# Patient Record
Sex: Male | Born: 1960 | Race: White | Hispanic: No | Smoking: Former smoker
Health system: Southern US, Community
[De-identification: ages and names within clinical notes are randomized; demographics above are authoritative.]

## PROBLEM LIST (undated history)

## (undated) DIAGNOSIS — M5410 Radiculopathy, site unspecified: Secondary | ICD-10-CM

## (undated) DIAGNOSIS — I10 Essential (primary) hypertension: Secondary | ICD-10-CM

## (undated) DIAGNOSIS — Z9002 Acquired absence of larynx: Secondary | ICD-10-CM

## (undated) DIAGNOSIS — E785 Hyperlipidemia, unspecified: Secondary | ICD-10-CM

## (undated) DIAGNOSIS — K219 Gastro-esophageal reflux disease without esophagitis: Secondary | ICD-10-CM

## (undated) DIAGNOSIS — I639 Cerebral infarction, unspecified: Secondary | ICD-10-CM

## (undated) DIAGNOSIS — E079 Disorder of thyroid, unspecified: Secondary | ICD-10-CM

## (undated) DIAGNOSIS — Z8673 Personal history of transient ischemic attack (TIA), and cerebral infarction without residual deficits: Secondary | ICD-10-CM

## (undated) DIAGNOSIS — E119 Type 2 diabetes mellitus without complications: Secondary | ICD-10-CM

## (undated) DIAGNOSIS — S92009A Unspecified fracture of unspecified calcaneus, initial encounter for closed fracture: Secondary | ICD-10-CM

## (undated) DIAGNOSIS — T1491XA Suicide attempt, initial encounter: Secondary | ICD-10-CM

## (undated) DIAGNOSIS — E109 Type 1 diabetes mellitus without complications: Secondary | ICD-10-CM

## (undated) DIAGNOSIS — F32A Depression, unspecified: Secondary | ICD-10-CM

## (undated) DIAGNOSIS — F329 Major depressive disorder, single episode, unspecified: Secondary | ICD-10-CM

## (undated) HISTORY — DX: Type 1 diabetes mellitus without complications: E10.9

## (undated) HISTORY — DX: Depression, unspecified: F32.A

## (undated) HISTORY — DX: Disorder of thyroid, unspecified: E07.9

## (undated) HISTORY — DX: Essential (primary) hypertension: I10

## (undated) HISTORY — DX: Acquired absence of larynx: Z90.02

## (undated) HISTORY — DX: Gastro-esophageal reflux disease without esophagitis: K21.9

## (undated) HISTORY — PX: LARYNGECTOMY: SUR815

## (undated) HISTORY — PX: NECK SURGERY: SHX720

## (undated) HISTORY — DX: Hyperlipidemia, unspecified: E78.5

## (undated) HISTORY — DX: Major depressive disorder, single episode, unspecified: F32.9

## (undated) HISTORY — PX: FRACTURE SURGERY: SHX138

## (undated) HISTORY — PX: SPINE SURGERY: SHX786

## (undated) HISTORY — PX: OTHER SURGICAL HISTORY: SHX169

---

## 2005-09-20 ENCOUNTER — Ambulatory Visit: Payer: Self-pay | Admitting: Internal Medicine

## 2005-10-06 ENCOUNTER — Ambulatory Visit: Payer: Self-pay

## 2006-03-08 ENCOUNTER — Ambulatory Visit: Payer: Self-pay

## 2006-04-09 ENCOUNTER — Ambulatory Visit: Payer: Self-pay | Admitting: Vascular Surgery

## 2009-11-26 ENCOUNTER — Inpatient Hospital Stay: Payer: Self-pay | Admitting: Psychiatry

## 2010-01-12 ENCOUNTER — Ambulatory Visit: Payer: Self-pay | Admitting: Family Medicine

## 2010-08-07 ENCOUNTER — Inpatient Hospital Stay: Payer: Self-pay | Admitting: *Deleted

## 2011-02-06 HISTORY — PX: HERNIA REPAIR: SHX51

## 2011-02-21 ENCOUNTER — Ambulatory Visit: Payer: Self-pay | Admitting: Surgery

## 2011-02-28 ENCOUNTER — Ambulatory Visit: Payer: Self-pay | Admitting: Surgery

## 2011-06-16 ENCOUNTER — Emergency Department: Payer: Self-pay | Admitting: Unknown Physician Specialty

## 2011-06-16 LAB — URINALYSIS, COMPLETE
Bilirubin,UR: NEGATIVE
Glucose,UR: NEGATIVE mg/dL (ref 0–75)
Ketone: NEGATIVE
Leukocyte Esterase: NEGATIVE
Ph: 5 (ref 4.5–8.0)
Protein: NEGATIVE
RBC,UR: NONE SEEN /HPF (ref 0–5)

## 2011-06-17 LAB — CBC
MCH: 32.2 pg (ref 26.0–34.0)
MCHC: 34.2 g/dL (ref 32.0–36.0)
MCV: 94 fL (ref 80–100)
WBC: 8.9 10*3/uL (ref 3.8–10.6)

## 2011-06-17 LAB — COMPREHENSIVE METABOLIC PANEL
Albumin: 4.3 g/dL (ref 3.4–5.0)
Alkaline Phosphatase: 72 U/L (ref 50–136)
Anion Gap: 8 (ref 7–16)
BUN: 18 mg/dL (ref 7–18)
Bilirubin,Total: 0.3 mg/dL (ref 0.2–1.0)
Calcium, Total: 9.1 mg/dL (ref 8.5–10.1)
Co2: 32 mmol/L (ref 21–32)
Creatinine: 1.06 mg/dL (ref 0.60–1.30)
EGFR (Non-African Amer.): >60
Glucose: 131 mg/dL — ABNORMAL HIGH (ref 65–99)
Osmolality: 289 (ref 275–301)
Sodium: 143 mmol/L (ref 136–145)
Total Protein: 7.5 g/dL (ref 6.4–8.2)

## 2012-05-02 ENCOUNTER — Ambulatory Visit: Payer: Self-pay | Admitting: Physical Medicine and Rehabilitation

## 2012-05-08 DIAGNOSIS — T1491XA Suicide attempt, initial encounter: Secondary | ICD-10-CM

## 2012-05-08 HISTORY — PX: TRACHEOSTOMY: SUR1362

## 2012-05-08 HISTORY — DX: Suicide attempt, initial encounter: T14.91XA

## 2012-08-27 ENCOUNTER — Emergency Department: Payer: Self-pay | Admitting: Emergency Medicine

## 2013-04-15 LAB — COMPREHENSIVE METABOLIC PANEL
Albumin: 3.8 g/dL (ref 3.4–5.0)
Alkaline Phosphatase: 91 U/L
Anion Gap: 6 — ABNORMAL LOW (ref 7–16)
Bilirubin,Total: 0.6 mg/dL (ref 0.2–1.0)
Calcium, Total: 9.7 mg/dL (ref 8.5–10.1)
Chloride: 106 mmol/L (ref 98–107)
EGFR (African American): >60
EGFR (Non-African Amer.): >60
Glucose: 171 mg/dL — ABNORMAL HIGH (ref 65–99)
Potassium: 3.8 mmol/L (ref 3.5–5.1)
SGOT(AST): 20 U/L (ref 15–37)
SGPT (ALT): 23 U/L (ref 12–78)
Total Protein: 7.1 g/dL (ref 6.4–8.2)

## 2013-04-15 LAB — CBC
HCT: 45.2 % (ref 40.0–52.0)
MCH: 30.8 pg (ref 26.0–34.0)
Platelet: 300 10*3/uL (ref 150–440)
RBC: 4.99 10*6/uL (ref 4.40–5.90)
RDW: 12.9 % (ref 11.5–14.5)
WBC: 9.7 10*3/uL (ref 3.8–10.6)

## 2013-04-15 LAB — ETHANOL: Ethanol: <3 mg/dL

## 2013-04-16 ENCOUNTER — Inpatient Hospital Stay: Payer: Self-pay | Admitting: Psychiatry

## 2013-04-16 LAB — URINALYSIS, COMPLETE
Bacteria: NONE SEEN
Glucose,UR: NEGATIVE mg/dL (ref 0–75)
Ketone: NEGATIVE
Leukocyte Esterase: NEGATIVE
Ph: 6 (ref 4.5–8.0)
Protein: NEGATIVE
RBC,UR: 1 /HPF (ref 0–5)
Specific Gravity: 1.012 (ref 1.003–1.030)

## 2013-04-16 LAB — DRUG SCREEN, URINE
Barbiturates, Ur Screen: NEGATIVE (ref ?–200)
Benzodiazepine, Ur Scrn: NEGATIVE (ref ?–200)
Cocaine Metabolite,Ur ~~LOC~~: NEGATIVE (ref ?–300)
MDMA (Ecstasy)Ur Screen: NEGATIVE (ref ?–500)
Methadone, Ur Screen: NEGATIVE (ref ?–300)
Opiate, Ur Screen: NEGATIVE (ref ?–300)
Phencyclidine (PCP) Ur S: NEGATIVE (ref ?–25)
Tricyclic, Ur Screen: NEGATIVE (ref ?–1000)

## 2013-05-04 ENCOUNTER — Emergency Department: Payer: Self-pay | Admitting: Emergency Medicine

## 2013-05-04 LAB — CBC WITH DIFFERENTIAL/PLATELET
Basophil %: 0.8 %
Eosinophil #: 0 10*3/uL (ref 0.0–0.7)
HCT: 47 % (ref 40.0–52.0)
HGB: 14.8 g/dL (ref 13.0–18.0)
Lymphocyte #: 1.8 10*3/uL (ref 1.0–3.6)
MCH: 30.6 pg (ref 26.0–34.0)
Neutrophil #: 25 10*3/uL — ABNORMAL HIGH (ref 1.4–6.5)
Neutrophil %: 86.5 %
Platelet: 319 10*3/uL (ref 150–440)
RBC: 4.82 10*6/uL (ref 4.40–5.90)
RDW: 13.7 % (ref 11.5–14.5)
WBC: 28.9 10*3/uL — ABNORMAL HIGH (ref 3.8–10.6)

## 2013-05-04 LAB — COMPREHENSIVE METABOLIC PANEL
Albumin: 3.4 g/dL (ref 3.4–5.0)
Alkaline Phosphatase: 121 U/L — ABNORMAL HIGH
Calcium, Total: 8.6 mg/dL (ref 8.5–10.1)
Chloride: 95 mmol/L — ABNORMAL LOW (ref 98–107)
Co2: 15 mmol/L — ABNORMAL LOW (ref 21–32)
Creatinine: 3.97 mg/dL — ABNORMAL HIGH (ref 0.60–1.30)
EGFR (African American): 19 — ABNORMAL LOW
EGFR (Non-African Amer.): 16 — ABNORMAL LOW
Glucose: 475 mg/dL — ABNORMAL HIGH (ref 65–99)
Potassium: 7.3 mmol/L (ref 3.5–5.1)
SGPT (ALT): 626 U/L — ABNORMAL HIGH (ref 12–78)
Sodium: 133 mmol/L — ABNORMAL LOW (ref 136–145)
Total Protein: 6.5 g/dL (ref 6.4–8.2)

## 2013-05-04 LAB — URINALYSIS, COMPLETE
Bilirubin,UR: NEGATIVE
Granular Cast: 4
Hyaline Cast: 1
Ketone: NEGATIVE
Leukocyte Esterase: NEGATIVE
Nitrite: NEGATIVE
Ph: 5 (ref 4.5–8.0)
RBC,UR: 1 /HPF (ref 0–5)
WBC UR: 3 /HPF (ref 0–5)

## 2013-05-04 LAB — DRUG SCREEN, URINE
Amphetamines, Ur Screen: NEGATIVE (ref ?–1000)
Barbiturates, Ur Screen: NEGATIVE (ref ?–200)
MDMA (Ecstasy)Ur Screen: NEGATIVE (ref ?–500)
Methadone, Ur Screen: NEGATIVE (ref ?–300)

## 2013-05-04 LAB — CK: CK, Total: 6260 U/L — ABNORMAL HIGH (ref 35–232)

## 2013-05-04 LAB — PHOSPHORUS: Phosphorus: 11.8 mg/dL — ABNORMAL HIGH (ref 2.5–4.9)

## 2013-05-04 LAB — ACETAMINOPHEN LEVEL: Acetaminophen: <2 ug/mL

## 2013-05-04 LAB — SALICYLATE LEVEL: Salicylates, Serum: 2.5 mg/dL

## 2013-05-09 DIAGNOSIS — D72829 Elevated white blood cell count, unspecified: Secondary | ICD-10-CM | POA: Insufficient documentation

## 2013-05-09 DIAGNOSIS — M6282 Rhabdomyolysis: Secondary | ICD-10-CM | POA: Insufficient documentation

## 2013-05-09 DIAGNOSIS — N179 Acute kidney failure, unspecified: Secondary | ICD-10-CM | POA: Insufficient documentation

## 2013-05-09 DIAGNOSIS — F329 Major depressive disorder, single episode, unspecified: Secondary | ICD-10-CM | POA: Insufficient documentation

## 2013-05-09 DIAGNOSIS — R7401 Elevation of levels of liver transaminase levels: Secondary | ICD-10-CM | POA: Insufficient documentation

## 2013-05-21 DIAGNOSIS — G9340 Encephalopathy, unspecified: Secondary | ICD-10-CM | POA: Insufficient documentation

## 2013-05-21 DIAGNOSIS — J189 Pneumonia, unspecified organism: Secondary | ICD-10-CM | POA: Insufficient documentation

## 2013-05-21 DIAGNOSIS — J180 Bronchopneumonia, unspecified organism: Secondary | ICD-10-CM | POA: Insufficient documentation

## 2013-05-21 DIAGNOSIS — I639 Cerebral infarction, unspecified: Secondary | ICD-10-CM | POA: Insufficient documentation

## 2013-06-04 DIAGNOSIS — G909 Disorder of the autonomic nervous system, unspecified: Secondary | ICD-10-CM | POA: Insufficient documentation

## 2013-06-12 DIAGNOSIS — F17201 Nicotine dependence, unspecified, in remission: Secondary | ICD-10-CM | POA: Insufficient documentation

## 2013-06-19 DIAGNOSIS — J385 Laryngeal spasm: Secondary | ICD-10-CM | POA: Insufficient documentation

## 2013-06-19 DIAGNOSIS — Q315 Congenital laryngomalacia: Secondary | ICD-10-CM | POA: Insufficient documentation

## 2013-06-19 DIAGNOSIS — I214 Non-ST elevation (NSTEMI) myocardial infarction: Secondary | ICD-10-CM | POA: Insufficient documentation

## 2013-07-14 ENCOUNTER — Ambulatory Visit: Payer: Self-pay | Admitting: Otolaryngology

## 2013-07-14 LAB — CREATININE, SERUM: Creatinine: 1.3 mg/dL (ref 0.60–1.30)

## 2013-08-19 DIAGNOSIS — J988 Other specified respiratory disorders: Secondary | ICD-10-CM | POA: Insufficient documentation

## 2013-08-19 DIAGNOSIS — J386 Stenosis of larynx: Secondary | ICD-10-CM | POA: Insufficient documentation

## 2013-08-26 ENCOUNTER — Encounter: Payer: Self-pay | Admitting: Family Medicine

## 2013-09-05 ENCOUNTER — Encounter: Payer: Self-pay | Admitting: Family Medicine

## 2013-09-15 DIAGNOSIS — E1069 Type 1 diabetes mellitus with other specified complication: Secondary | ICD-10-CM | POA: Insufficient documentation

## 2013-09-15 DIAGNOSIS — I82409 Acute embolism and thrombosis of unspecified deep veins of unspecified lower extremity: Secondary | ICD-10-CM | POA: Insufficient documentation

## 2013-09-15 DIAGNOSIS — Z9889 Other specified postprocedural states: Secondary | ICD-10-CM | POA: Insufficient documentation

## 2013-09-15 DIAGNOSIS — E785 Hyperlipidemia, unspecified: Secondary | ICD-10-CM | POA: Insufficient documentation

## 2013-10-06 ENCOUNTER — Encounter: Payer: Self-pay | Admitting: Family Medicine

## 2013-11-05 ENCOUNTER — Encounter: Payer: Self-pay | Admitting: Family Medicine

## 2013-12-22 DIAGNOSIS — F411 Generalized anxiety disorder: Secondary | ICD-10-CM | POA: Insufficient documentation

## 2014-02-17 ENCOUNTER — Inpatient Hospital Stay: Payer: Self-pay | Admitting: Internal Medicine

## 2014-02-17 LAB — COMPREHENSIVE METABOLIC PANEL
ALBUMIN: 3.4 g/dL (ref 3.4–5.0)
ALK PHOS: 115 U/L
ANION GAP: 12 (ref 7–16)
BUN: 27 mg/dL — ABNORMAL HIGH (ref 7–18)
Bilirubin,Total: 0.3 mg/dL (ref 0.2–1.0)
CALCIUM: 8.6 mg/dL (ref 8.5–10.1)
CREATININE: 1.71 mg/dL — AB (ref 0.60–1.30)
Chloride: 108 mmol/L — ABNORMAL HIGH (ref 98–107)
Co2: 18 mmol/L — ABNORMAL LOW (ref 21–32)
EGFR (African American): 54 — ABNORMAL LOW
EGFR (Non-African Amer.): 45 — ABNORMAL LOW
Glucose: 281 mg/dL — ABNORMAL HIGH (ref 65–99)
OSMOLALITY: 291 (ref 275–301)
Potassium: 4.3 mmol/L (ref 3.5–5.1)
SGOT(AST): 20 U/L (ref 15–37)
SGPT (ALT): 25 U/L
SODIUM: 138 mmol/L (ref 136–145)
Total Protein: 6.9 g/dL (ref 6.4–8.2)

## 2014-02-17 LAB — CBC
HCT: 45 % (ref 40.0–52.0)
HGB: 14.7 g/dL (ref 13.0–18.0)
MCH: 30 pg (ref 26.0–34.0)
MCHC: 32.7 g/dL (ref 32.0–36.0)
MCV: 92 fL (ref 80–100)
PLATELETS: 310 10*3/uL (ref 150–440)
RBC: 4.9 10*6/uL (ref 4.40–5.90)
RDW: 14.3 % (ref 11.5–14.5)
WBC: 10.1 10*3/uL (ref 3.8–10.6)

## 2014-02-17 LAB — PROTIME-INR
INR: 1
Prothrombin Time: 12.9 secs (ref 11.5–14.7)

## 2014-02-17 LAB — MAGNESIUM: MAGNESIUM: 1.7 mg/dL — AB

## 2014-02-17 LAB — PHOSPHORUS: Phosphorus: 4 mg/dL (ref 2.5–4.9)

## 2014-02-17 LAB — TROPONIN I: Troponin-I: <0.02 ng/mL

## 2014-02-18 LAB — CBC WITH DIFFERENTIAL/PLATELET
BASOS PCT: 0.7 %
Basophil #: 0.1 10*3/uL (ref 0.0–0.1)
Eosinophil #: 0.1 10*3/uL (ref 0.0–0.7)
Eosinophil %: 0.7 %
HCT: 37.5 % — AB (ref 40.0–52.0)
HGB: 11.9 g/dL — ABNORMAL LOW (ref 13.0–18.0)
LYMPHS ABS: 1.6 10*3/uL (ref 1.0–3.6)
Lymphocyte %: 17.7 %
MCH: 29.4 pg (ref 26.0–34.0)
MCHC: 31.6 g/dL — ABNORMAL LOW (ref 32.0–36.0)
MCV: 93 fL (ref 80–100)
MONO ABS: 0.8 x10 3/mm (ref 0.2–1.0)
MONOS PCT: 9.1 %
NEUTROS PCT: 71.8 %
Neutrophil #: 6.3 10*3/uL (ref 1.4–6.5)
PLATELETS: 225 10*3/uL (ref 150–440)
RBC: 4.04 10*6/uL — AB (ref 4.40–5.90)
RDW: 14.2 % (ref 11.5–14.5)
WBC: 8.8 10*3/uL (ref 3.8–10.6)

## 2014-02-18 LAB — BASIC METABOLIC PANEL
ANION GAP: 7 (ref 7–16)
BUN: 24 mg/dL — AB (ref 7–18)
CREATININE: 1.57 mg/dL — AB (ref 0.60–1.30)
Calcium, Total: 7.9 mg/dL — ABNORMAL LOW (ref 8.5–10.1)
Chloride: 108 mmol/L — ABNORMAL HIGH (ref 98–107)
Co2: 27 mmol/L (ref 21–32)
EGFR (African American): >60
EGFR (Non-African Amer.): 50 — ABNORMAL LOW
GLUCOSE: 168 mg/dL — AB (ref 65–99)
Osmolality: 291 (ref 275–301)
Potassium: 4.4 mmol/L (ref 3.5–5.1)
Sodium: 142 mmol/L (ref 136–145)

## 2014-02-18 LAB — MAGNESIUM: Magnesium: 1.8 mg/dL

## 2014-02-19 LAB — CBC WITH DIFFERENTIAL/PLATELET
BASOS ABS: 0.1 10*3/uL (ref 0.0–0.1)
Basophil %: 1.2 %
EOS ABS: 0.2 10*3/uL (ref 0.0–0.7)
Eosinophil %: 4 %
HCT: 38 % — AB (ref 40.0–52.0)
HGB: 12.3 g/dL — ABNORMAL LOW (ref 13.0–18.0)
LYMPHS ABS: 1.4 10*3/uL (ref 1.0–3.6)
LYMPHS PCT: 23.6 %
MCH: 29.7 pg (ref 26.0–34.0)
MCHC: 32.3 g/dL (ref 32.0–36.0)
MCV: 92 fL (ref 80–100)
Monocyte #: 0.5 x10 3/mm (ref 0.2–1.0)
Monocyte %: 8.9 %
NEUTROS ABS: 3.8 10*3/uL (ref 1.4–6.5)
Neutrophil %: 62.3 %
PLATELETS: 235 10*3/uL (ref 150–440)
RBC: 4.14 10*6/uL — AB (ref 4.40–5.90)
RDW: 14.1 % (ref 11.5–14.5)
WBC: 6.1 10*3/uL (ref 3.8–10.6)

## 2014-02-19 LAB — BASIC METABOLIC PANEL
ANION GAP: 6 — AB (ref 7–16)
BUN: 13 mg/dL (ref 7–18)
CALCIUM: 8.6 mg/dL (ref 8.5–10.1)
CHLORIDE: 108 mmol/L — AB (ref 98–107)
CO2: 25 mmol/L (ref 21–32)
Creatinine: 1.4 mg/dL — ABNORMAL HIGH (ref 0.60–1.30)
EGFR (Non-African Amer.): 57 — ABNORMAL LOW
Glucose: 206 mg/dL — ABNORMAL HIGH (ref 65–99)
Osmolality: 284 (ref 275–301)
Potassium: 4.2 mmol/L (ref 3.5–5.1)
Sodium: 139 mmol/L (ref 136–145)

## 2014-02-19 LAB — URINALYSIS, COMPLETE
BILIRUBIN, UR: NEGATIVE
Bacteria: NONE SEEN
Blood: NEGATIVE
Glucose,UR: >=500 mg/dL (ref 0–75)
Ketone: NEGATIVE
Leukocyte Esterase: NEGATIVE
NITRITE: NEGATIVE
PROTEIN: NEGATIVE
Ph: 7 (ref 4.5–8.0)
RBC,UR: NONE SEEN /HPF (ref 0–5)
Specific Gravity: 1.01 (ref 1.003–1.030)
Squamous Epithelial: NONE SEEN
WBC UR: NONE SEEN /HPF (ref 0–5)

## 2014-02-19 LAB — VANCOMYCIN, TROUGH: Vancomycin, Trough: 10 ug/mL (ref 10–20)

## 2014-02-20 LAB — BASIC METABOLIC PANEL
Anion Gap: 5 — ABNORMAL LOW (ref 7–16)
BUN: 18 mg/dL (ref 7–18)
CREATININE: 1.26 mg/dL (ref 0.60–1.30)
Calcium, Total: 8.7 mg/dL (ref 8.5–10.1)
Chloride: 107 mmol/L (ref 98–107)
Co2: 28 mmol/L (ref 21–32)
EGFR (African American): >60
GLUCOSE: 201 mg/dL — AB (ref 65–99)
Osmolality: 287 (ref 275–301)
Potassium: 4.5 mmol/L (ref 3.5–5.1)
SODIUM: 140 mmol/L (ref 136–145)

## 2014-02-20 LAB — CBC WITH DIFFERENTIAL/PLATELET
Basophil #: 0.1 10*3/uL (ref 0.0–0.1)
Basophil %: 1 %
EOS ABS: 0.3 10*3/uL (ref 0.0–0.7)
Eosinophil %: 4.9 %
HCT: 39 % — ABNORMAL LOW (ref 40.0–52.0)
HGB: 13.1 g/dL (ref 13.0–18.0)
Lymphocyte #: 1.7 10*3/uL (ref 1.0–3.6)
Lymphocyte %: 27.9 %
MCH: 30.5 pg (ref 26.0–34.0)
MCHC: 33.5 g/dL (ref 32.0–36.0)
MCV: 91 fL (ref 80–100)
MONO ABS: 0.6 x10 3/mm (ref 0.2–1.0)
Monocyte %: 10.2 %
NEUTROS PCT: 56 %
Neutrophil #: 3.3 10*3/uL (ref 1.4–6.5)
Platelet: 245 10*3/uL (ref 150–440)
RBC: 4.29 10*6/uL — ABNORMAL LOW (ref 4.40–5.90)
RDW: 14 % (ref 11.5–14.5)
WBC: 5.9 10*3/uL (ref 3.8–10.6)

## 2014-02-21 LAB — URINE CULTURE

## 2014-02-21 LAB — EXPECTORATED SPUTUM ASSESSMENT W GRAM STAIN, RFLX TO RESP C

## 2014-02-22 LAB — CULTURE, BLOOD (SINGLE)

## 2014-06-22 DIAGNOSIS — R1310 Dysphagia, unspecified: Secondary | ICD-10-CM | POA: Insufficient documentation

## 2014-06-30 DIAGNOSIS — Z9002 Acquired absence of larynx: Secondary | ICD-10-CM | POA: Insufficient documentation

## 2014-08-28 NOTE — Consult Note (Signed)
REFERING PHYSICIAN : MARK QUALE. COMPLAIN: AMS/ RESPIRATORY FAILURE  yo male with previous psychiatric admissions comes today after wife found him to be comfused and lethargic.he had a bottle of pain medication that he took last night and today was to lethargic to get out of his bed.does not have a rx for narcotics so is suspicious that he could take a full bottle of an OTC medication.gave narcan with out success. OF SYSTEMS:to obtain due to AMS/ unresponsiveness, MEDICAL HISTORY:  HISTORY: pt does not drink or smoke unable to obtain more info. HISTORY: UNABLE TO OBTAIN  40mg  po qday20mg  po qday aspart pump.325mg  po qday40mg  po qday 2mg  po qday  HISTORY foot surgery  plavix and lipitor.  EXAMINATION: 67/47   hr 84  rr 24-28   T98Tcritically ill s/p intubation due to respiratory distress with use of accesory muscles.normocephalic , PERRL , ETT 90WI to lip. no oral lession.supple, co jvd, no mases, trachea is central.Clear to auscultation. No wheezes. pos use of accesory muscles., no dullness to percussion Regular rate and rhythm. no murmurs rubbs or gallops , no displacemetn of pmi Soft, nontender. Normal bowel soundsno clubbing, no edema, no cyanosis, no edema of joints. No acute skin lesions identified.   NEUROLOGICAL; sedated.unable to access due to sedationneg lymphadenopathy on neck or axila. 7.2232+ [id number] 7.3  AST 974   ALT 6260.328KST ELEVATION ANT AND SEPTAL LEADS. acute resp failure./ airway protection. shock: placement of central linept on levophed ( dopamin while levophed availableshock with increase wbc.  multiorgan failure ; Liver enzimes/circulatory shock, / respiratory  and AMS. stemi: rec to transfer to [facility]. hyperkalemia: treated by ER physicain. spend 35 min with chart reviewed/ no procedure counted,   Electronic Signatures: James Ivanoff, Roselie Awkward (MD)  (Signed on 29-Dec-14 00:07)  Authored  Last Updated: 29-Dec-14 00:07 by James Ivanoff, Roselie Awkward  (MD)

## 2014-08-28 NOTE — Discharge Summary (Signed)
PATIENT NAME:  [name], [name] MR#:  [id number] DATE OF BIRTH:  1960/08/07  DATE OF ADMISSION:  04/16/2013 DATE OF DISCHARGE:  04/23/2013  HOSPITAL COURSE: See dictated history and physical for details of admission. A 54 year old man with a history of major depression, was admitted through the Emergency Room where he presented with severe depression that include symptoms of suicidal ideation and hallucinations. In the hospital, he was treated with a combination of medication and therapy. Medications included citalopram which was titrated up to 40 mg and Abilify currently at 2 mg. The patient complained that the medicines were making him tired, so they have both been changed to nighttime dosing. He has shown gradual but clear improvement in his mood. His affect is brighter. He is not irritable anymore. He denies any suicidal ideation and is able to identify positive things to live for. He denies having any hallucinations and does not appear to be responding to internal stimuli. He appears to be much more in touch with the reality of his illness and his home obligations. He has been cooperative with treatment and has attended groups. He has also been cooperative with taking care of his diabetes and blood pressure. The patient will be discharged back home; his family be looking in on him. We are going to make a follow up appointment with him see [facility] in the community. He will be continued on his current medication at the time of discharge. The patient has been counseled about recurrent depression and the importance of staying active in his treatment.   Mental status exam at discharge: This is a patient who is cooperative makes good eye contact. Alert and oriented x 4. Speech normal rate, tone and volume. Affect smiling and more upbeat. General psychomotor activity is still a little bit sluggish. Speech still a little bit decreased in amount. Thoughts are organized with no disorganized or bizarre thinking. Denies  hallucinations. Denies suicidal or homicidal ideation. Shows improved insight and judgment. Normal intelligence. Alert and oriented x 4.   LABORATORY RESULTS: Admission labs included a drug screen that was negative and a urinalysis that was negative including even being negative for glucose. His blood sugars have been up and down, but have mostly been under good control in the mid 100s with his use of his insulin pump. Admission glucose was 171, and the rest of the chemistry was normal. Alcohol level 0.  CBC unremarkable.   DISCHARGE MEDICATIONS: Citalopram 40 mg p.o. at bedtime, aripiprazole 2 mg p.o. at bedtime, lisinopril 20 mg p.o. daily, aspirin 325 mg 2 tablets in the morning from what he says and his insulin pump that he should continue to use as he has previously.   DIAGNOSIS, PRINCIPAL AND PRIMARY:   AXIS I: Major depression, severe, recurrent with psychotic features.   SECONDARY DIAGNOSES: AXIS I: No further.   AXIS II: No diagnosis.   AXIS III: Type 1 diabetes, high blood pressure.   AXIS IV: Moderate to severe from financial problems.   AXIS V: Functioning at time of discharge 55.  ____________________________ Gonzella Lex, MD jtc:rw D: 04/23/2013 13:19:00 ET T: 04/23/2013 16:55:20 ET JOB#: [id number]  cc: Gonzella Lex, MD, <Dictator> Gonzella Lex MD ELECTRONICALLY SIGNED 04/24/2013 10:38

## 2014-08-28 NOTE — Consult Note (Signed)
Brief Consult Note: Diagnosis: major depression.   Patient was seen by consultant.   Consult note dictated.   Recommend further assessment or treatment.   Orders entered.   Discussed with Attending MD.   Comments: Psychiatry: Admit to inpatient psychiatry unit.  Electronic Signatures: Nahum Sherrer, [name] (MD)  (Signed 10-Dec-14 13:58)  Authored: Brief Consult Note   Last Updated: 10-Dec-14 13:58 by Gonzella Lex (MD)

## 2014-08-28 NOTE — H&P (Signed)
PATIENT NAME:  [name], PANICO MR#:  [id number] DATE OF BIRTH:  [birth year]  DATE OF ADMISSION:  04/15/2013  DATE OF ASSESSMENT: 04/16/2013   IDENTIFYING INFORMATION AND REASON FOR CONSULT: A 54 year old man with history of major depression, who came to the Emergency Room voluntarily.   CHIEF COMPLAINT: "I have just been feeling depressed."   HISTORY OF PRESENT ILLNESS: Information obtained from the patient, the patient's chart and the patient's mother. The patient reports he is feeling extremely depressed. Feels very negative and terrible about himself all the time. Energy level is very low. Feels like sleeping all the time. He sleeps poorly. He has had passive suicidal thoughts with a wish that he would just die. He also reports that he sometimes has auditory and visual hallucinations, especially when he is home by himself. He feels like he has not been able to do his job adequately recently. All this has been going on for at least the last several weeks. He is not currently getting any psychiatric treatment or medication. He denies any substance abuse.   PAST PSYCHIATRIC HISTORY: The patient has had previous episodes of severe depression. He was here admitted for depression back in 2011. After that, he was referred for outpatient treatment in the community. The patient and his mother both say that they thought that his medication that he was taking when he was discharged last time was excessive and that he was over-medicated. They say that when he went for outpatient followup, he did not continue with the psychiatrist. They both say that Dr. Arline Asp, who is his primary care doctor, gave him citalopram, and they thought that that was the most effective thing that he took. Denies any history of suicide attempts. 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   SUBSTANCE ABUSE HISTORY: The patient himself denies any alcohol or drug abuse problems, but the chart indicates that he has  had alcohol dependence problems in the past, but stopped drinking some time ago. The patient says he is not currently drinking.   FAMILY HISTORY: Does not know of any family history of mental health problems.   PAST MEDICAL HISTORY: The patient has diabetes and is on an insulin pump. Also has a history of hypertension and dyslipidemia.   SOCIAL HISTORY: He lives just with his son, who is an adolescent. The patient is employed, but recently feels like he has not been able to do his job as well as he should. He says he has significant financial problems.   REVIEW OF SYSTEMS: Emphasizes fatigue. Also depression, negative thinking, hallucinations at times. No obvious delusions. Passive suicidal thoughts.   CURRENT MEDICATIONS: He uses an insulin pump. Also takes lisinopril and simvastatin and medication for acid reflux.   ALLERGIES: LIPITOR AND PLAVIX.   MENTAL STATUS EXAMINATION: Disheveled gentleman, looks his stated age. Passively cooperative with the interview. Makes no eye contact. Psychomotor activity very slow and limited. Speech slow and a little bit slurred. Affect flat, dysphoric and grumpy. Mood stated as being depressed. Thoughts are lucid. No obvious loosening of associations or delusions. Denies auditory hallucinations right now, but says that he has them when he is at home. Also has visual hallucinations at times. Passive suicidal thoughts with wishes that he would die. No homicidal ideation. Intelligence normal. Alert and oriented x4. Judgment and insight borderline.   PHYSICAL EXAMINATION:  GENERAL: The patient is a bit disheveled, but appears to be in no acute distress.  SKIN: No acute skin lesions identified.  HEENT: Pupils equal and reactive. Face symmetric.  MUSCULOSKELETAL AND NEUROLOGICAL: Full range of motion at all extremities. Gait within normal limits. Strength and reflexes normal and symmetric throughout. Subjective tingling in his hands and feet chronically. Cranial  nerves intact and symmetric.  LUNGS: Clear to auscultation. No wheezes.  HEART: Regular rate and rhythm.  ABDOMEN: Soft, nontender. Normal bowel sounds.  CURRENT VITAL SIGNS: Include a blood pressure of 150/67, respirations 18, pulse 81, temperature 98.5.   LABORATORY RESULTS: Urinalysis unremarkable, including being also negative for glucose. Drug screen negative. Blood glucose 177. Salicylates and acetaminophen negative. Alcohol negative. Chemistry otherwise unremarkable. CBC unremarkable.   ASSESSMENT: A 54 year old man with major depression, possibly with psychotic features, poor outpatient functioning. Denies acute intention to hurt himself. Still basically lucid. I advised the patient that I would recommend that we admit him to the hospital at least briefly, but did not feel like he was necessarily committable. The patient, after consulting with his mother, agreed for inpatient admission.   PLAN: Admit him to the hospital. Continue current medicine. Start citalopram 20 mg a day. Engage him in individual and group psychotherapy and work on outpatient referral.   DIAGNOSIS, PRINCIPAL AND PRIMARY:  AXIS I: Major depression, severe, recurrent.   SECONDARY DIAGNOSIS:  AXIS I: No further.  AXIS II: Deferred.  AXIS III:  1. Type 1 diabetes.  2. Hypertension.  3. Dyslipidemia.  AXIS IV: Severe from illness and financial stress.  AXIS V: Functioning at time of admission 35.  ____________________________ Gonzella Lex, MD jtc:lb D: 04/16/2013 14:07:41 ET T: 04/16/2013 14:56:20 ET JOB#: [id number]  cc: Gonzella Lex, MD, <Dictator> Gonzella Lex MD ELECTRONICALLY SIGNED 04/16/2013 17:26

## 2014-08-29 NOTE — Consult Note (Signed)
Brief Consult Note: Diagnosis: subglottic stenosis/airway obstruction.   Patient was seen by consultant.   Consult note dictated.   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 Has discussed total laryngectomy with MD's at [facility], and [facility].  I agree that would be his best longterm airway maintenance and would prevent aspiration pnuemonia in the future.  This is not a procedure we can do here at [facility].  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  Currently requiring ventilation due to pneumonia, once that stabilizes can deflate cuff and continue routine trache care until surgery at [facility].  Electronic Signatures: Roena Malady (MD)  (Signed 13-Oct-15 18:30)  Authored: Brief Consult Note   Last Updated: 13-Oct-15 18:30 by Roena Malady (MD)

## 2014-08-29 NOTE — Discharge Summary (Signed)
PATIENT NAME:  [name], [name] MR#:  [id number] DATE OF BIRTH:  08/19/1960  DATE OF ADMISSION:  02/17/2014 DATE OF DISCHARGE:  02/20/2014  DISCHARGE DIAGNOSES:  1. Pneumonia - likely aspiration pneumonia.  2. Acute ventilator-dependent respiratory failure with hypoxia due to displaced tracheostomy tube - resolved.  3. Tracheostomy tube malformation.  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  5. Diabetes mellitus type 2, insulin-dependent, using insulin pump.  6. Hypertension.  7. 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 Dr. Beverly Gust. 2. Dr. Devona Konig.  HISTORY OF PRESENT ILLNESS: This 54 year old Caucasian man with history of diabetes, hypertension, tracheostomy tube for 6 months after prolonged intubation and subglottic stenosis presents to the Emergency Room from home with hypoxia. He had a mild cough for the days preceding admission. The morning of admission he woke up and was trying to clean his tracheostomy tube, he took it out and could not replace the tube.  On the time of EMS arrival the patient had passed out and had significant hypoxia. The tube was able to be replaced in the field, but had significant breathing and continued shortness of breath. At the time of admission he is placed on the ventilator and admitted to the critical care unit.  HOSPITAL COURSE BY PROBLEM:  1. Pneumonia most likely due to aspiration or injury during tracheostomy tube malfunction. Blood and sputum cultures have been negative. Oxygen saturation has been excellent on the ventilator and now on room air. Chest x-ray did show a left basilar opacity which progressed during admission. He is discharged on oral Levaquin to complete a ten-day course.  2. Acute ventilatory dependent respiratory failure due to tracheostomy malfunction. Upon admission he was placed on the ventilator. He was able to wean from the ventilator  within 24 hours and is now very comfortable on room air.  3. Insulin-dependent diabetes mellitus: The patient used his own pump while in hospital. It was noted that his blood sugars were slightly high throughout the admission and we discussed this with him. He will follow up with his endocrinologist in the outpatient setting.  4. Hypertension: He is discharged on his prior medications for hypertension.  5. History of cerebrovascular accident: There were no new neurologic symptoms. He has residual right-sided weakness. Discharged without changes to prior medical regimen.   PROCEDURES: Chest x-ray performed October 13 shows tracheostomy tube in good anatomic position. Mild bibasilar atelectasis and/or infiltrate. Repeat chest x-ray performed on October 14 shows worsening left basilar aeration, possibly representing pneumonia with increasing capacity due to hydration.    PHYSICAL EXAMINATION:  VITAL SIGNS: Temperature 98.3, pulse 75, blood pressure 156/76, oxygenation 97% on room air.  GENERAL: No acute distress, comfortable in hospital bed.  PULMONARY: Lungs are clear to auscultation bilaterally with good air movement, no respiratory distress, no cough.  CARDIAC: Regular rate and rhythm, no murmurs, rubs, or gallops. Peripheral pulses are 2+, there is no peripheral edema.   LABORATORY DATA: Sodium 140, potassium 4.5, chloride 107, bicarbonate 28, BUN 18, creatinine 1.26, glucose 201. White blood cells 5.9, hemoglobin 13.1, platelets 245 and MCV 91. UA with no signs of infection. Urine culture no growth. Sputum culture no growth at the time of discharge. Blood cultures no growth.   DISCHARGE MEDICATIONS:  1. Simvastatin 20 mg 1 tablet once a day.  2. Amlodipine 10 mg 1 tablet once a day.  3. Aspirin 81 mg 1 tablet once a day.  4. Beclomethasone 48 mcg/inhalations inhaler 2 puffs inhaled 2 times a day.  5. Citalopram 20 mg 1 tablet once a day.  6. Famotidine 40 mg 1 tablet once a day.  7. Insulin  Lispro 100 units/mL subcutaneous 1 dose subcutaneously 3 times a day via insulin pump.  8. Losartan 100 mg 1 tablet once a day.  9. Multivitamin 1 tablet once a day.  10. Mirtazapine 15 mg 1 tablet once a day.  11. Vitamin B and C iron multivitamin 1 tablet once a day.  12. Levofloxacin 500 mg oral tablet 1 tablet every 24 hours to complete 10 day course.   CONDITION ON DISCHARGE: Stable.   DISPOSITION: The patient is discharged to home.   INSTRUCTIONS: No home health needs.   DIET: Carbohydrate modified diet.   ACTIVITY: No restrictions.   FOLLOW-UP: Follow-up in 1 to 2 weeks if [facility] ENT as previously scheduled.   TIME SPENT ON DISCHARGE: 45 minutes.    ____________________________ Earleen Newport. Volanda Napoleon, MD cpw:JT D: 02/21/2014 21:58:04 ET T: 02/21/2014 23:03:54 ET JOB#: [id number]  cc: Earleen Newport. Volanda Napoleon, MD, <Dictator> Aldean Jewett MD ELECTRONICALLY SIGNED 02/22/2014 20:30

## 2014-08-29 NOTE — H&P (Signed)
PATIENT NAME:  [name], [name] MR#:  [id number] DATE OF BIRTH:  Oct 05, 1960  DATE OF ADMISSION:  02/17/2014  PRIMARY CARE PHYSICIAN: Dr. Baldemar Lenis  ENT PHYSICIANS: Located at [facility], Dr. Manuella Ghazi and Dr. Posey Pronto.   CHIEF COMPLAINT: Tracheostomy tube malfunction and shortness of breath.   HISTORY OF PRESENT ILLNESS: A 54 year old Caucasian male patient with history of diabetes, hypertension, a tracheostomy tube for 6 months after admitted to [facility] for 2 months presented to the Emergency Room from home by EMS with hypoxia.   The patient had some mild cough for the past few days otherwise was feeling overall okay. Today morning he got up, was trying to clean his tracheostomy tube, took it out but could not place it back, had some bleeding. On EMS arrival, the patient had passed out with significant hypoxia. The tube was able to be replaced, but had significant bleeding, had severe shortness of breath. Here in the Emergency Room, the patient has been found to have lactic acid elevated to 4.6, tachycardic. The patient complaining of significant shortness of breath and needed admission to CCU being on ventilator. Presently sats are 100%. The patient has been accepted at [facility] MICU by Dr. [name] Eagle, but there are no beds available and the patient is being admitted to the CCU here in the meanwhile for transition of care from Emergency Room to [facility] MICU.   The patient is afebrile, normal white count, but has been tachycardic, tachypneic, low pCO2 and elevated lactic acid of 4.6.   The patient has done well since discharge to [facility]. He has had issues with trach stricture, replacing of tracheostomy tube. He follows with ENT at [facility].   His chest x-ray today has shown bilateral basilar atelectasis versus infiltrates.   History from patient is difficult due to his trach and inability to speak. History has been obtained from family at bedside and old records.   PAST MEDICAL HISTORY: 1.  Insulin-dependent diabetes mellitus with  insulin pump.  2.  Hypertension.  3.  CVA with residual right upper extremity weakness.  4.  Tracheostomy.  5.  Ankle surgery.   SOCIAL HISTORY: The patient is a former smoker. Rare alcohol use. No illicit drug use.   CODE STATUS: FULL code.   FAMILY HISTORY: Diabetes in his mother.   REVIEW OF SYSTEMS: Unobtainable as the patient is unable to speak at this point, on the ventilator with trach.  ALLERGIES: PLAVIX AND GABAPENTIN.   HOME MEDICATIONS: List is unavailable at this point. Attempts made to call [employer].   PHYSICAL EXAMINATION: VITAL SIGNS: Temperature 98, pulse 118, blood pressure 155/100, saturating 98% on the ventilator.  GENERAL: Obese, Caucasian male patient lying in bed in respiratory distress, anxious.  HEENT: Has tracheostomy tube in place with some mild bleeding around it. Pupils bilaterally equal and reactive to light. No blood noticed in nasal nares or oral cavity. No icterus or pallor.  NECK: Supple. No lymphadenopathy.  CARDIOVASCULAR: S1 and S2 without any murmurs. Peripheral pulses 2+. No edema.  RESPIRATORY: Has increased work of breathing. No crackles or wheezing or decreased air entry at bases.  GASTROINTESTINAL: Soft abdomen, nontender. Bowel sounds present. No organomegaly palpable.  GENITOURINARY: No significant bladder distention.  SKIN: Warm and dry. No petechiae, rash.  MUSCULOSKELETAL: No joint swelling, redness, or effusion in large joints. Normal muscle tone.  NEUROLOGICAL: Motor strength 5/5 in upper and lower extremities, except decreased motor strength in the right upper extremity.  LYMPHATIC: No cervical lymphadenopathy.   DIAGNOSTIC DATA: Laboratory  studies show glucose of 281, BUN 27, creatinine 1.71, sodium 138, potassium 4.3, chloride 108, bicarb 18, EGFR of 45, magnesium 1.7, AST, ALT, alkaline phosphatase, bilirubin normal. Troponin less than 0.02.   WBC 10.1, hemoglobin 14.7, platelets 310,000. INR 1.   ABG initially showed a pH of  7.4 with pCO2 of 27. Lactic acid 4.6.  EKG shows normal sinus rhythm, LVH. No acute ST elevation found.   Chest x-ray shows bilateral basilar atelectasis with infiltrate.   ASSESSMENT AND PLAN: 1.  Severe sepsis with possible bilateral aspiration pneumonia in a patient with significant hypoxia and syncope after he could not replace his tracheostomy tube. At this point, he has tracheostomy tube back, on a ventilator, and his sats are 100%. The patient will be admitted to CCU here to transition care from Emergency Room to [facility] MICU with accepting physician being Dr. [name] Eagle. Will consult pulmonary for helping out with the case while the patient is admitted here. We will also consult ENT for tracheostomy care. Start on Zosyn for possible aspiration pneumonia. Aggressive IV fluid resuscitation. Send for blood and sputum cultures. The patient seems to be extremely anxious contributing to his shortness of breath. The patient's severe lactic acidosis could also be due to his hypoxia as he did not have a tracheostomy tube in place for a significant amount of time.  2.  Insulin-dependent diabetes mellitus. The patient has an insulin pump. 3.  Hypertension. The patient's home medication regimen is not available at this time. We will put him on IV p.r.n. medications at this point.  4.  History of cerebrovascular accident. No new symptoms, stable.  6.  Deep vein thrombosis prophylaxis with heparin.   CODE STATUS: FULL code.   CRITICAL CARE TIME SPENT: 45 minutes. ____________________________ Leia Alf Jacquese Hackman, MD srs:sb D: 02/17/2014 13:58:30 ET T: 02/17/2014 14:30:58 ET JOB#: [id number]  cc: Alveta Heimlich R. Maycen Degregory, MD, <Dictator> Derinda Late, MD [facility] - Dr. Augusto Gamble MD ELECTRONICALLY SIGNED 02/18/2014 17:04

## 2014-08-29 NOTE — Consult Note (Signed)
PATIENT NAME:  [name], JUBA MR#:  [id number] DATE OF BIRTH:  [birth year]  DATE OF CONSULTATION:  02/17/2014  REFERRING PHYSICIAN:   CONSULTING PHYSICIAN:  Roena Malady, MD  ATTENDING PHYSICIAN:  Dr. Darvin Neighbours.    CONSULTING PHYSICIAN:  Dr. Beverly Gust.   REASON FOR CONSULTATION: Request regarding tracheostomy care.   HISTORY OF PRESENT ILLNESS: This is a 54 year old gentleman who had actually been seen in our practice back in March of this year. He had a history of cerebrovascular accident, was intubated for approximately 2 months while at [facility].  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  He was sent to have a CT scan confirming this. According to his mom and significant other he was then seen at [facility] by Dr. Brigitte Pulse and Dr. Posey Pronto in the department of otolaryngology, head and neck surgery. He was diagnosed with severe subglottic stenosis and underwent tracheostomy tube placement. He has had a number 6 uncuffed tracheostomy tube at home, but has not been able to phonate around the 6 uncuffed tube due to significant subglottic component of his stenosis. He has been seen both at [facility] and [facility] and has been given an option of permanent tracheostomy tube versus total laryngectomy. He believes he has decided to proceed with total laryngectomy. He has had routine tracheostomy care at home and has not had significant trouble, this morning was noted to have some crusted blood around the tube, took out the inner cannula, decided to take out the entire tracheostomy, once he did he not have the obturator in position and was unable to replace the tracheostomy back in.  He then passed out, his significant other was able to re-establish airway and EMS came, were able to put the tracheostomy back in, a number 6 cuffed tracheostomy tube was placed as he was also noted to have probable pneumonia and would require ventilation. ENT was  called to evaluate the tracheostomy to see if any other treatment needed to be performed at this time.   PAST MEDICAL HISTORY: Significant for insulin-dependent diabetes, hypertension, CVA with right upper extremity weakness.   PAST SURGICAL HISTORY: Significant for tracheostomy and ankle surgery.   SOCIAL HISTORY:  Former smoker. No alcohol or drugs. He is a full code.   FAMILY HISTORY: Noncontributory.   REVIEW OF SYSTEMS: He is ventilated, but is able to awake and respond to commands. He does not have voice at this point.    ALLERGIES:  PLAVIX AND GABAPENTIN.   MEDICATIONS:  He is on multiple medications which are noted and listed in the chart.   PHYSICAL EXAMINATION:  Today the external ears are clear. The anterior nose is unremarkable. Oral cavity, pharynx with dry mucous membranes, but otherwise clear. Examination of the neck, he has a number 6 cuffed Shiley tracheostomy tube in position. There is no active bleeding. The site looks clean and dry.   ASSESSMENT AND PLAN:  I had about a 35-40 minute discussion with his family. I do not think that we need to change this tracheostomy as it possibly could cause significant airway obstruction and the fact that he was not phonating around the cuffless tracheostomy, I do not think it would make a difference if he had his cuffless tube back in versus the cuffed one he has, plus at this point he is require ventilation. According to Dr. Boykin Reaper notes once stabilized and MICU opens at [facility] he will be transferred  there, but I do not see any reason that once this airway does stabilize from a ventilatory standpoint that he can not get back to his normal breathing through the 6 cuffed tracheostomy, as he was no able to phonate with the cuffless. He does have an appointment scheduled for followup with Dr. Posey Pronto and/or Dr. Manuella Ghazi at ENT at [facility] and would recommend followup there. Would not recommend taking out the entire tracheostomy again until he is seen there  as this could again cause airway obstruction.  If there are any questions feel free to call.      ____________________________ Roena Malady, MD ctm:bu D: 02/17/2014 18:23:00 ET T: 02/17/2014 18:37:46 ET JOB#: [id number]  cc: Roena Malady, MD, <Dictator> Roena Malady MD ELECTRONICALLY SIGNED 02/25/2014 8:35

## 2014-08-29 NOTE — Consult Note (Signed)
PATIENT NAME:  [name], [name] MR#:  [id number] DATE OF BIRTH:  August 31, 1960  DATE OF CONSULTATION:  02/17/2014  REFERRING PHYSICIAN:   CONSULTING PHYSICIAN:  Roena Malady, MD  ADDENDUM:    I just dictated a consult note on [name], please send a copy of that stat to Dr. Posey Pronto and Dr. Manuella Ghazi  in the department of otolaryngology at [facility].     ____________________________ Roena Malady, MD ctm:bu D: 02/17/2014 18:24:01 ET T: 02/17/2014 20:05:02 ET JOB#: [id number]  cc: Roena Malady, MD, <Dictator> Roena Malady MD ELECTRONICALLY SIGNED 02/25/2014 8:35

## 2015-05-07 ENCOUNTER — Ambulatory Visit: Payer: Self-pay | Admitting: Licensed Clinical Social Worker

## 2015-05-12 ENCOUNTER — Ambulatory Visit: Payer: Self-pay | Admitting: Licensed Clinical Social Worker

## 2015-06-10 ENCOUNTER — Ambulatory Visit: Payer: Self-pay | Admitting: Psychiatry

## 2015-10-07 DIAGNOSIS — Z9002 Acquired absence of larynx: Secondary | ICD-10-CM | POA: Diagnosis not present

## 2015-10-26 DIAGNOSIS — E1065 Type 1 diabetes mellitus with hyperglycemia: Secondary | ICD-10-CM | POA: Diagnosis not present

## 2015-10-26 DIAGNOSIS — E781 Pure hyperglyceridemia: Secondary | ICD-10-CM | POA: Diagnosis not present

## 2015-11-03 DIAGNOSIS — Z930 Tracheostomy status: Secondary | ICD-10-CM | POA: Diagnosis not present

## 2015-11-04 DIAGNOSIS — R809 Proteinuria, unspecified: Secondary | ICD-10-CM | POA: Diagnosis not present

## 2015-11-04 DIAGNOSIS — E10649 Type 1 diabetes mellitus with hypoglycemia without coma: Secondary | ICD-10-CM | POA: Diagnosis not present

## 2015-11-04 DIAGNOSIS — E781 Pure hyperglyceridemia: Secondary | ICD-10-CM | POA: Diagnosis not present

## 2015-11-04 DIAGNOSIS — Z4681 Encounter for fitting and adjustment of insulin pump: Secondary | ICD-10-CM | POA: Diagnosis not present

## 2015-11-04 DIAGNOSIS — E785 Hyperlipidemia, unspecified: Secondary | ICD-10-CM | POA: Diagnosis not present

## 2015-11-04 DIAGNOSIS — F339 Major depressive disorder, recurrent, unspecified: Secondary | ICD-10-CM | POA: Diagnosis not present

## 2015-11-04 DIAGNOSIS — E1029 Type 1 diabetes mellitus with other diabetic kidney complication: Secondary | ICD-10-CM | POA: Diagnosis not present

## 2015-11-04 DIAGNOSIS — C329 Malignant neoplasm of larynx, unspecified: Secondary | ICD-10-CM | POA: Diagnosis not present

## 2015-11-04 DIAGNOSIS — E1065 Type 1 diabetes mellitus with hyperglycemia: Secondary | ICD-10-CM | POA: Diagnosis not present

## 2015-11-04 DIAGNOSIS — R7989 Other specified abnormal findings of blood chemistry: Secondary | ICD-10-CM | POA: Diagnosis not present

## 2015-11-12 DIAGNOSIS — Z9002 Acquired absence of larynx: Secondary | ICD-10-CM | POA: Diagnosis not present

## 2015-11-16 DIAGNOSIS — E109 Type 1 diabetes mellitus without complications: Secondary | ICD-10-CM | POA: Diagnosis not present

## 2015-11-24 DIAGNOSIS — N183 Chronic kidney disease, stage 3 (moderate): Secondary | ICD-10-CM | POA: Diagnosis not present

## 2015-11-24 DIAGNOSIS — E1065 Type 1 diabetes mellitus with hyperglycemia: Secondary | ICD-10-CM | POA: Diagnosis not present

## 2015-11-24 DIAGNOSIS — E1022 Type 1 diabetes mellitus with diabetic chronic kidney disease: Secondary | ICD-10-CM | POA: Diagnosis not present

## 2015-11-24 DIAGNOSIS — Z79899 Other long term (current) drug therapy: Secondary | ICD-10-CM | POA: Diagnosis not present

## 2015-11-24 DIAGNOSIS — R7989 Other specified abnormal findings of blood chemistry: Secondary | ICD-10-CM | POA: Diagnosis not present

## 2015-11-30 DIAGNOSIS — F320 Major depressive disorder, single episode, mild: Secondary | ICD-10-CM | POA: Diagnosis not present

## 2015-11-30 DIAGNOSIS — E7800 Pure hypercholesterolemia, unspecified: Secondary | ICD-10-CM | POA: Diagnosis not present

## 2015-11-30 DIAGNOSIS — E119 Type 2 diabetes mellitus without complications: Secondary | ICD-10-CM | POA: Diagnosis not present

## 2015-11-30 DIAGNOSIS — E039 Hypothyroidism, unspecified: Secondary | ICD-10-CM | POA: Diagnosis not present

## 2015-11-30 DIAGNOSIS — Z79899 Other long term (current) drug therapy: Secondary | ICD-10-CM | POA: Diagnosis not present

## 2015-12-16 DIAGNOSIS — F339 Major depressive disorder, recurrent, unspecified: Secondary | ICD-10-CM | POA: Diagnosis not present

## 2015-12-16 DIAGNOSIS — E1029 Type 1 diabetes mellitus with other diabetic kidney complication: Secondary | ICD-10-CM | POA: Diagnosis not present

## 2015-12-16 DIAGNOSIS — Z4681 Encounter for fitting and adjustment of insulin pump: Secondary | ICD-10-CM | POA: Diagnosis not present

## 2015-12-16 DIAGNOSIS — E785 Hyperlipidemia, unspecified: Secondary | ICD-10-CM | POA: Diagnosis not present

## 2015-12-16 DIAGNOSIS — E10649 Type 1 diabetes mellitus with hypoglycemia without coma: Secondary | ICD-10-CM | POA: Diagnosis not present

## 2015-12-16 DIAGNOSIS — E781 Pure hyperglyceridemia: Secondary | ICD-10-CM | POA: Diagnosis not present

## 2015-12-16 DIAGNOSIS — E1065 Type 1 diabetes mellitus with hyperglycemia: Secondary | ICD-10-CM | POA: Diagnosis not present

## 2015-12-16 DIAGNOSIS — E039 Hypothyroidism, unspecified: Secondary | ICD-10-CM | POA: Diagnosis not present

## 2015-12-16 DIAGNOSIS — R809 Proteinuria, unspecified: Secondary | ICD-10-CM | POA: Diagnosis not present

## 2015-12-17 DIAGNOSIS — Z930 Tracheostomy status: Secondary | ICD-10-CM | POA: Diagnosis not present

## 2016-01-03 DIAGNOSIS — F1421 Cocaine dependence, in remission: Secondary | ICD-10-CM | POA: Diagnosis not present

## 2016-01-03 DIAGNOSIS — F331 Major depressive disorder, recurrent, moderate: Secondary | ICD-10-CM | POA: Diagnosis not present

## 2016-01-03 DIAGNOSIS — G4700 Insomnia, unspecified: Secondary | ICD-10-CM | POA: Diagnosis not present

## 2016-01-03 DIAGNOSIS — F1021 Alcohol dependence, in remission: Secondary | ICD-10-CM | POA: Diagnosis not present

## 2016-01-11 DIAGNOSIS — Z430 Encounter for attention to tracheostomy: Secondary | ICD-10-CM | POA: Diagnosis not present

## 2016-01-11 DIAGNOSIS — C329 Malignant neoplasm of larynx, unspecified: Secondary | ICD-10-CM | POA: Diagnosis not present

## 2016-01-11 DIAGNOSIS — R491 Aphonia: Secondary | ICD-10-CM | POA: Diagnosis not present

## 2016-01-14 DIAGNOSIS — E785 Hyperlipidemia, unspecified: Secondary | ICD-10-CM | POA: Diagnosis not present

## 2016-01-14 DIAGNOSIS — Z6835 Body mass index (BMI) 35.0-35.9, adult: Secondary | ICD-10-CM | POA: Diagnosis not present

## 2016-01-14 DIAGNOSIS — R0981 Nasal congestion: Secondary | ICD-10-CM | POA: Diagnosis not present

## 2016-01-14 DIAGNOSIS — Z9002 Acquired absence of larynx: Secondary | ICD-10-CM | POA: Diagnosis not present

## 2016-01-14 DIAGNOSIS — R1310 Dysphagia, unspecified: Secondary | ICD-10-CM | POA: Diagnosis not present

## 2016-01-14 DIAGNOSIS — K219 Gastro-esophageal reflux disease without esophagitis: Secondary | ICD-10-CM | POA: Diagnosis not present

## 2016-01-14 DIAGNOSIS — E119 Type 2 diabetes mellitus without complications: Secondary | ICD-10-CM | POA: Diagnosis not present

## 2016-01-14 DIAGNOSIS — Z794 Long term (current) use of insulin: Secondary | ICD-10-CM | POA: Diagnosis not present

## 2016-01-14 DIAGNOSIS — I252 Old myocardial infarction: Secondary | ICD-10-CM | POA: Diagnosis not present

## 2016-01-14 DIAGNOSIS — I10 Essential (primary) hypertension: Secondary | ICD-10-CM | POA: Diagnosis not present

## 2016-01-14 DIAGNOSIS — Z7982 Long term (current) use of aspirin: Secondary | ICD-10-CM | POA: Diagnosis not present

## 2016-01-14 DIAGNOSIS — Z87891 Personal history of nicotine dependence: Secondary | ICD-10-CM | POA: Diagnosis not present

## 2016-01-14 DIAGNOSIS — J386 Stenosis of larynx: Secondary | ICD-10-CM | POA: Diagnosis not present

## 2016-01-14 DIAGNOSIS — R491 Aphonia: Secondary | ICD-10-CM | POA: Diagnosis not present

## 2016-01-14 DIAGNOSIS — Z930 Tracheostomy status: Secondary | ICD-10-CM | POA: Diagnosis not present

## 2016-01-14 DIAGNOSIS — Z86718 Personal history of other venous thrombosis and embolism: Secondary | ICD-10-CM | POA: Diagnosis not present

## 2016-01-20 DIAGNOSIS — E039 Hypothyroidism, unspecified: Secondary | ICD-10-CM | POA: Diagnosis not present

## 2016-01-25 DIAGNOSIS — Z930 Tracheostomy status: Secondary | ICD-10-CM | POA: Diagnosis not present

## 2016-01-27 DIAGNOSIS — E10649 Type 1 diabetes mellitus with hypoglycemia without coma: Secondary | ICD-10-CM | POA: Diagnosis not present

## 2016-01-27 DIAGNOSIS — E039 Hypothyroidism, unspecified: Secondary | ICD-10-CM | POA: Diagnosis not present

## 2016-01-27 DIAGNOSIS — R809 Proteinuria, unspecified: Secondary | ICD-10-CM | POA: Diagnosis not present

## 2016-01-27 DIAGNOSIS — Z4681 Encounter for fitting and adjustment of insulin pump: Secondary | ICD-10-CM | POA: Diagnosis not present

## 2016-01-27 DIAGNOSIS — E785 Hyperlipidemia, unspecified: Secondary | ICD-10-CM | POA: Diagnosis not present

## 2016-01-27 DIAGNOSIS — E781 Pure hyperglyceridemia: Secondary | ICD-10-CM | POA: Diagnosis not present

## 2016-01-27 DIAGNOSIS — E1029 Type 1 diabetes mellitus with other diabetic kidney complication: Secondary | ICD-10-CM | POA: Diagnosis not present

## 2016-01-27 DIAGNOSIS — E1065 Type 1 diabetes mellitus with hyperglycemia: Secondary | ICD-10-CM | POA: Diagnosis not present

## 2016-02-16 DIAGNOSIS — E109 Type 1 diabetes mellitus without complications: Secondary | ICD-10-CM | POA: Diagnosis not present

## 2016-02-18 DIAGNOSIS — Z930 Tracheostomy status: Secondary | ICD-10-CM | POA: Diagnosis not present

## 2016-02-22 DIAGNOSIS — E109 Type 1 diabetes mellitus without complications: Secondary | ICD-10-CM | POA: Diagnosis not present

## 2016-02-22 DIAGNOSIS — E1065 Type 1 diabetes mellitus with hyperglycemia: Secondary | ICD-10-CM | POA: Diagnosis not present

## 2016-02-22 DIAGNOSIS — E1029 Type 1 diabetes mellitus with other diabetic kidney complication: Secondary | ICD-10-CM | POA: Diagnosis not present

## 2016-02-22 DIAGNOSIS — E10649 Type 1 diabetes mellitus with hypoglycemia without coma: Secondary | ICD-10-CM | POA: Diagnosis not present

## 2016-02-25 DIAGNOSIS — E119 Type 2 diabetes mellitus without complications: Secondary | ICD-10-CM | POA: Diagnosis not present

## 2016-02-25 DIAGNOSIS — Z79899 Other long term (current) drug therapy: Secondary | ICD-10-CM | POA: Diagnosis not present

## 2016-02-25 DIAGNOSIS — E039 Hypothyroidism, unspecified: Secondary | ICD-10-CM | POA: Diagnosis not present

## 2016-03-03 DIAGNOSIS — I10 Essential (primary) hypertension: Secondary | ICD-10-CM | POA: Diagnosis not present

## 2016-03-03 DIAGNOSIS — E7800 Pure hypercholesterolemia, unspecified: Secondary | ICD-10-CM | POA: Diagnosis not present

## 2016-03-03 DIAGNOSIS — N183 Chronic kidney disease, stage 3 (moderate): Secondary | ICD-10-CM | POA: Diagnosis not present

## 2016-03-03 DIAGNOSIS — E039 Hypothyroidism, unspecified: Secondary | ICD-10-CM | POA: Diagnosis not present

## 2016-03-03 DIAGNOSIS — E1022 Type 1 diabetes mellitus with diabetic chronic kidney disease: Secondary | ICD-10-CM | POA: Diagnosis not present

## 2016-03-03 DIAGNOSIS — B356 Tinea cruris: Secondary | ICD-10-CM | POA: Diagnosis not present

## 2016-03-03 DIAGNOSIS — Z79899 Other long term (current) drug therapy: Secondary | ICD-10-CM | POA: Diagnosis not present

## 2016-03-03 DIAGNOSIS — Z23 Encounter for immunization: Secondary | ICD-10-CM | POA: Diagnosis not present

## 2016-03-20 DIAGNOSIS — Z930 Tracheostomy status: Secondary | ICD-10-CM | POA: Diagnosis not present

## 2016-03-28 DIAGNOSIS — F1021 Alcohol dependence, in remission: Secondary | ICD-10-CM | POA: Diagnosis not present

## 2016-03-28 DIAGNOSIS — F1421 Cocaine dependence, in remission: Secondary | ICD-10-CM | POA: Diagnosis not present

## 2016-03-28 DIAGNOSIS — G4700 Insomnia, unspecified: Secondary | ICD-10-CM | POA: Diagnosis not present

## 2016-03-28 DIAGNOSIS — F331 Major depressive disorder, recurrent, moderate: Secondary | ICD-10-CM | POA: Diagnosis not present

## 2016-04-07 DIAGNOSIS — Z794 Long term (current) use of insulin: Secondary | ICD-10-CM | POA: Diagnosis not present

## 2016-04-07 DIAGNOSIS — Y831 Surgical operation with implant of artificial internal device as the cause of abnormal reaction of the patient, or of later complication, without mention of misadventure at the time of the procedure: Secondary | ICD-10-CM | POA: Diagnosis not present

## 2016-04-07 DIAGNOSIS — Z9002 Acquired absence of larynx: Secondary | ICD-10-CM | POA: Diagnosis not present

## 2016-04-07 DIAGNOSIS — R0602 Shortness of breath: Secondary | ICD-10-CM | POA: Diagnosis not present

## 2016-04-07 DIAGNOSIS — E119 Type 2 diabetes mellitus without complications: Secondary | ICD-10-CM | POA: Diagnosis not present

## 2016-04-07 DIAGNOSIS — R1310 Dysphagia, unspecified: Secondary | ICD-10-CM | POA: Diagnosis not present

## 2016-04-07 DIAGNOSIS — T85638A Leakage of other specified internal prosthetic devices, implants and grafts, initial encounter: Secondary | ICD-10-CM | POA: Diagnosis not present

## 2016-04-10 DIAGNOSIS — Z930 Tracheostomy status: Secondary | ICD-10-CM | POA: Diagnosis not present

## 2016-05-16 DIAGNOSIS — R491 Aphonia: Secondary | ICD-10-CM | POA: Diagnosis not present

## 2016-05-16 DIAGNOSIS — Z9002 Acquired absence of larynx: Secondary | ICD-10-CM | POA: Diagnosis not present

## 2016-05-16 DIAGNOSIS — J386 Stenosis of larynx: Secondary | ICD-10-CM | POA: Diagnosis not present

## 2016-05-16 DIAGNOSIS — Z6835 Body mass index (BMI) 35.0-35.9, adult: Secondary | ICD-10-CM | POA: Diagnosis not present

## 2016-05-17 DIAGNOSIS — Z930 Tracheostomy status: Secondary | ICD-10-CM | POA: Diagnosis not present

## 2016-05-19 DIAGNOSIS — E1065 Type 1 diabetes mellitus with hyperglycemia: Secondary | ICD-10-CM | POA: Diagnosis not present

## 2016-05-19 DIAGNOSIS — E039 Hypothyroidism, unspecified: Secondary | ICD-10-CM | POA: Diagnosis not present

## 2016-05-22 DIAGNOSIS — F1021 Alcohol dependence, in remission: Secondary | ICD-10-CM | POA: Diagnosis not present

## 2016-05-22 DIAGNOSIS — G4700 Insomnia, unspecified: Secondary | ICD-10-CM | POA: Diagnosis not present

## 2016-05-22 DIAGNOSIS — F331 Major depressive disorder, recurrent, moderate: Secondary | ICD-10-CM | POA: Diagnosis not present

## 2016-05-26 DIAGNOSIS — E10649 Type 1 diabetes mellitus with hypoglycemia without coma: Secondary | ICD-10-CM | POA: Diagnosis not present

## 2016-05-26 DIAGNOSIS — E1029 Type 1 diabetes mellitus with other diabetic kidney complication: Secondary | ICD-10-CM | POA: Diagnosis not present

## 2016-05-26 DIAGNOSIS — E039 Hypothyroidism, unspecified: Secondary | ICD-10-CM | POA: Diagnosis not present

## 2016-05-26 DIAGNOSIS — E785 Hyperlipidemia, unspecified: Secondary | ICD-10-CM | POA: Diagnosis not present

## 2016-05-26 DIAGNOSIS — Z4681 Encounter for fitting and adjustment of insulin pump: Secondary | ICD-10-CM | POA: Diagnosis not present

## 2016-05-26 DIAGNOSIS — E1065 Type 1 diabetes mellitus with hyperglycemia: Secondary | ICD-10-CM | POA: Diagnosis not present

## 2016-05-26 DIAGNOSIS — R809 Proteinuria, unspecified: Secondary | ICD-10-CM | POA: Diagnosis not present

## 2016-05-26 DIAGNOSIS — E781 Pure hyperglyceridemia: Secondary | ICD-10-CM | POA: Diagnosis not present

## 2016-06-16 DIAGNOSIS — Z930 Tracheostomy status: Secondary | ICD-10-CM | POA: Diagnosis not present

## 2016-06-28 DIAGNOSIS — E039 Hypothyroidism, unspecified: Secondary | ICD-10-CM | POA: Diagnosis not present

## 2016-06-28 DIAGNOSIS — Z79899 Other long term (current) drug therapy: Secondary | ICD-10-CM | POA: Diagnosis not present

## 2016-06-28 DIAGNOSIS — E7800 Pure hypercholesterolemia, unspecified: Secondary | ICD-10-CM | POA: Diagnosis not present

## 2016-06-28 DIAGNOSIS — E1022 Type 1 diabetes mellitus with diabetic chronic kidney disease: Secondary | ICD-10-CM | POA: Diagnosis not present

## 2016-06-28 DIAGNOSIS — N183 Chronic kidney disease, stage 3 (moderate): Secondary | ICD-10-CM | POA: Diagnosis not present

## 2016-07-04 DIAGNOSIS — E1065 Type 1 diabetes mellitus with hyperglycemia: Secondary | ICD-10-CM | POA: Diagnosis not present

## 2016-07-04 DIAGNOSIS — Z79899 Other long term (current) drug therapy: Secondary | ICD-10-CM | POA: Diagnosis not present

## 2016-07-04 DIAGNOSIS — I10 Essential (primary) hypertension: Secondary | ICD-10-CM | POA: Diagnosis not present

## 2016-07-04 DIAGNOSIS — E039 Hypothyroidism, unspecified: Secondary | ICD-10-CM | POA: Diagnosis not present

## 2016-07-04 DIAGNOSIS — E1022 Type 1 diabetes mellitus with diabetic chronic kidney disease: Secondary | ICD-10-CM | POA: Diagnosis not present

## 2016-07-04 DIAGNOSIS — E7800 Pure hypercholesterolemia, unspecified: Secondary | ICD-10-CM | POA: Diagnosis not present

## 2016-07-04 DIAGNOSIS — N183 Chronic kidney disease, stage 3 (moderate): Secondary | ICD-10-CM | POA: Diagnosis not present

## 2016-07-04 DIAGNOSIS — K219 Gastro-esophageal reflux disease without esophagitis: Secondary | ICD-10-CM | POA: Diagnosis not present

## 2016-07-11 DIAGNOSIS — Z930 Tracheostomy status: Secondary | ICD-10-CM | POA: Diagnosis not present

## 2016-07-24 DIAGNOSIS — Z872 Personal history of diseases of the skin and subcutaneous tissue: Secondary | ICD-10-CM | POA: Diagnosis not present

## 2016-07-24 DIAGNOSIS — E119 Type 2 diabetes mellitus without complications: Secondary | ICD-10-CM | POA: Diagnosis not present

## 2016-07-24 DIAGNOSIS — L820 Inflamed seborrheic keratosis: Secondary | ICD-10-CM | POA: Diagnosis not present

## 2016-08-10 DIAGNOSIS — R809 Proteinuria, unspecified: Secondary | ICD-10-CM | POA: Diagnosis not present

## 2016-08-10 DIAGNOSIS — F339 Major depressive disorder, recurrent, unspecified: Secondary | ICD-10-CM | POA: Diagnosis not present

## 2016-08-10 DIAGNOSIS — E10649 Type 1 diabetes mellitus with hypoglycemia without coma: Secondary | ICD-10-CM | POA: Diagnosis not present

## 2016-08-10 DIAGNOSIS — E781 Pure hyperglyceridemia: Secondary | ICD-10-CM | POA: Diagnosis not present

## 2016-08-10 DIAGNOSIS — E785 Hyperlipidemia, unspecified: Secondary | ICD-10-CM | POA: Diagnosis not present

## 2016-08-10 DIAGNOSIS — E1029 Type 1 diabetes mellitus with other diabetic kidney complication: Secondary | ICD-10-CM | POA: Diagnosis not present

## 2016-08-10 DIAGNOSIS — E039 Hypothyroidism, unspecified: Secondary | ICD-10-CM | POA: Diagnosis not present

## 2016-08-10 DIAGNOSIS — E1065 Type 1 diabetes mellitus with hyperglycemia: Secondary | ICD-10-CM | POA: Diagnosis not present

## 2016-08-10 DIAGNOSIS — Z4681 Encounter for fitting and adjustment of insulin pump: Secondary | ICD-10-CM | POA: Diagnosis not present

## 2016-08-14 DIAGNOSIS — Z930 Tracheostomy status: Secondary | ICD-10-CM | POA: Diagnosis not present

## 2016-08-17 DIAGNOSIS — F331 Major depressive disorder, recurrent, moderate: Secondary | ICD-10-CM | POA: Diagnosis not present

## 2016-08-17 DIAGNOSIS — F1021 Alcohol dependence, in remission: Secondary | ICD-10-CM | POA: Diagnosis not present

## 2016-08-17 DIAGNOSIS — F1421 Cocaine dependence, in remission: Secondary | ICD-10-CM | POA: Diagnosis not present

## 2016-08-17 DIAGNOSIS — G4700 Insomnia, unspecified: Secondary | ICD-10-CM | POA: Diagnosis not present

## 2016-08-21 DIAGNOSIS — R079 Chest pain, unspecified: Secondary | ICD-10-CM | POA: Diagnosis not present

## 2016-08-21 DIAGNOSIS — J0100 Acute maxillary sinusitis, unspecified: Secondary | ICD-10-CM | POA: Diagnosis not present

## 2016-08-22 ENCOUNTER — Encounter: Payer: Self-pay | Admitting: Dietician

## 2016-08-22 ENCOUNTER — Encounter: Payer: PPO | Attending: Internal Medicine | Admitting: Dietician

## 2016-08-22 VITALS — BP 136/74 | Ht 72.0 in | Wt 251.4 lb

## 2016-08-22 DIAGNOSIS — Z713 Dietary counseling and surveillance: Secondary | ICD-10-CM | POA: Insufficient documentation

## 2016-08-22 DIAGNOSIS — E119 Type 2 diabetes mellitus without complications: Secondary | ICD-10-CM | POA: Diagnosis not present

## 2016-08-22 DIAGNOSIS — E1021 Type 1 diabetes mellitus with diabetic nephropathy: Secondary | ICD-10-CM

## 2016-08-22 NOTE — Patient Instructions (Signed)
  Scan  Libre for blood sugars  before each meal and before bed every day and 2 hr pp meals + PRN and record  Avoid sugar sweetened drinks (soda, tea, coffee, sports drinks, juices)  Eat 3 meals day-eat 45-60 grams carbohydrates/meal + protein  If eating snack-include 15 grams carbohydrates/snack + protein  Count carb grams as accurate as possible  Space meals 4-6 hours apart  Complete 3 Day Food Record and bring to next appt-estimate carbohydrate grams  Bring blood sugar records to the next appointment/class  Get a Haematologist medical alert ID  Carry fast acting glucose and a snack at all times  Rotate injection sites  Take meal boluses 15 min. before eating meals if able   Return for appointment/classes on: 09-07-16

## 2016-08-22 NOTE — Progress Notes (Signed)
Diabetes Self-Management Education  Visit Type: First/Initial  Appt. Start Time: 1525 Appt. End Time: 1640  08/22/2016  [name], identified by name and date of birth, is a 56 y.o. male with a diagnosis of Diabetes: Type 1.   ASSESSMENT  Blood pressure 136/74, height 6' (1.829 m), weight 251 lb 6.4 oz (114 kg). Body mass index is 34.1 kg/m.      Diabetes Self-Management Education - 08/22/16 1708      Visit Information   Visit Type First/Initial     Initial Visit   Diabetes Type Type 1     Health Coping   How would you rate your overall health? Fair     Psychosocial Assessment   Patient Belief/Attitude about Diabetes Defeat/Burnout  frustrated with managing diabetes   Self-care barriers --  difficult to manage diabetes along with other chronic health problems   Self-management support Family;Doctor's office   Other persons present Parent;Patient   Patient Concerns Weight Control;Other (comment);Glycemic Control  become more fit; review carbohydrate counting   Special Needs None   Preferred Learning Style Hands on   Learning Readiness Contemplating   What is the last grade level you completed in school? 12+ 3 yrs college     Pre-Education Assessment   Patient understands the diabetes disease and treatment process. Demonstrates understanding / competency   Patient understands incorporating nutritional management into lifestyle. Needs Review   Patient undertands incorporating physical activity into lifestyle. Needs Review   Patient understands using medications safely. Needs Review   Patient understands monitoring blood glucose, interpreting and using results Demonstrates understanding / competency   Patient understands prevention, detection, and treatment of acute complications. Needs Review   Patient understands prevention, detection, and treatment of chronic complications. Needs Review   Patient understands how to develop strategies to address psychosocial  issues. Needs Review   Patient understands how to develop strategies to promote health/change behavior. Needs Review     Complications   Last HgB A1C per patient/outside source 8.2 %  06-28-16   How often do you check your blood sugar? --  uses Libre CGM-scans BG readings 8-10x/day   Fasting Blood glucose range (mg/dL) 70-129;130-179;180-200 (today FBG 72; FBG 08-21-16 was 198); BG during visit 225   Postprandial Blood glucose range (mg/dL) >200;180-200   Number of hypoglycemic episodes per month --  occasional low BG after having high pp result and taking correction dose of Novolog    Have you had a dilated eye exam in the past 12 months? Yes  about 1 year ago-appt. 08-29-16   Have you had a dental exam in the past 12 months? Yes  08-21-16   Are you checking your feet? Yes   How many days per week are you checking your feet? 7     Dietary Intake   Breakfast --  times varies for breakfast-eats cold cereal and milk or egg/cheese sandwich   Snack (morning) --  none   Lunch --  often eats meat/cheese sandwich for lunch-time varies   Snack (afternoon) --  none   Dinner --  eats sandwich or meat with vegetables   Snack (evening) --  none   Beverage(s) --  drinks 8+ glasses of water/day, milk 2x/day and fruit juice 1x/day     Exercise   Exercise Type --  no regular exercise     Patient Education   Previous Diabetes Education Yes (please comment)   Nutrition management  Role of diet in the treatment of diabetes and  the relationship between the three main macronutrients and blood glucose level;Food label reading, portion sizes and measuring food.;Carbohydrate counting   Physical activity and exercise  Role of exercise on diabetes management, blood pressure control and cardiac health.   Medications Taught/reviewed insulin injection, site rotation, insulin storage and needle disposal.;Reviewed patients medication for diabetes, action, purpose, timing of dose and side effects.  reviewed  use of Medtronic 551 pump-using Novolog in pump; Current basal rates: 12a=1.15/hr, 8a=1.25/hr; ICR 12a=7, 9a=8, 6p=6;  ISF=22; target BG 12a=100-130; active insulin time 3 hr   Acute complications Taught treatment of hypoglycemia - the 15 rule.   Personal strategies to promote health Lifestyle issues that need to be addressed for better diabetes care;Helped patient develop diabetes management plan for (enter comment)      Individualized Plan for Diabetes Self-Management Training:   Learning Objective:  Patient will have a greater understanding of diabetes self-management. Patient education plan is to attend individual and/or group sessions per assessed needs and concerns.   Plan:   Patient Instructions   Scan  [name] for blood sugars  before each meal and before bed every day and 2 hr pp meals + PRN and record  Avoid sugar sweetened drinks (soda, tea, coffee, sports drinks, juices)  Eat 3 meals day-eat 45-60 grams carbohydrates/meal + protein  If eating snack-include 15 grams carbohydrates/snack + protein  Count carb grams as accurate as possible  Space meals 4-5 hours apart  Complete 3 Day Food Record and bring to next appt-estimate carbohydrate grams  Bring blood sugar records to the next appointment/class  Get a Haematologist medical alert ID  Carry fast acting glucose and a snack at all times  Rotate injection sites  Take meal boluses 15 min. before eating meals if able   If BG's remain elevated 2 hr after eating meals, try using Novolog pen for boluses and use pump only for basal insulin  Return for appointment/classes on: 09-07-16   Expected Outcomes:   Expect minimal changes  Education material provided: General meal planning guidelines, Carbohydrate counting and meal planning booklet, low BG handout, Take Charge by Counting Carbs booklet   If problems or questions, patient to contact team via:  [phone]  Future DSME appointment:  09-07-16

## 2016-08-24 DIAGNOSIS — I10 Essential (primary) hypertension: Secondary | ICD-10-CM | POA: Diagnosis not present

## 2016-08-24 DIAGNOSIS — E7801 Familial hypercholesterolemia: Secondary | ICD-10-CM | POA: Diagnosis not present

## 2016-08-24 DIAGNOSIS — F418 Other specified anxiety disorders: Secondary | ICD-10-CM | POA: Diagnosis not present

## 2016-08-24 DIAGNOSIS — R079 Chest pain, unspecified: Secondary | ICD-10-CM | POA: Diagnosis not present

## 2016-08-24 DIAGNOSIS — R0602 Shortness of breath: Secondary | ICD-10-CM | POA: Diagnosis not present

## 2016-08-27 ENCOUNTER — Other Ambulatory Visit: Payer: Self-pay

## 2016-08-27 ENCOUNTER — Encounter (HOSPITAL_COMMUNITY): Payer: Self-pay | Admitting: *Deleted

## 2016-08-27 ENCOUNTER — Emergency Department (HOSPITAL_COMMUNITY)
Admission: EM | Admit: 2016-08-27 | Discharge: 2016-08-28 | Disposition: A | Discharge status: Home / Self Care | Payer: PPO | Attending: Emergency Medicine | Admitting: Emergency Medicine

## 2016-08-27 ENCOUNTER — Emergency Department (HOSPITAL_COMMUNITY): Payer: PPO

## 2016-08-27 DIAGNOSIS — Z794 Long term (current) use of insulin: Secondary | ICD-10-CM | POA: Insufficient documentation

## 2016-08-27 DIAGNOSIS — E119 Type 2 diabetes mellitus without complications: Secondary | ICD-10-CM | POA: Insufficient documentation

## 2016-08-27 DIAGNOSIS — F329 Major depressive disorder, single episode, unspecified: Secondary | ICD-10-CM

## 2016-08-27 DIAGNOSIS — Z7982 Long term (current) use of aspirin: Secondary | ICD-10-CM | POA: Insufficient documentation

## 2016-08-27 DIAGNOSIS — I10 Essential (primary) hypertension: Secondary | ICD-10-CM | POA: Diagnosis not present

## 2016-08-27 DIAGNOSIS — F32A Depression, unspecified: Secondary | ICD-10-CM

## 2016-08-27 DIAGNOSIS — F419 Anxiety disorder, unspecified: Secondary | ICD-10-CM

## 2016-08-27 DIAGNOSIS — F418 Other specified anxiety disorders: Secondary | ICD-10-CM | POA: Insufficient documentation

## 2016-08-27 DIAGNOSIS — R0602 Shortness of breath: Secondary | ICD-10-CM | POA: Diagnosis not present

## 2016-08-27 DIAGNOSIS — Z79899 Other long term (current) drug therapy: Secondary | ICD-10-CM | POA: Diagnosis not present

## 2016-08-27 HISTORY — DX: Type 2 diabetes mellitus without complications: E11.9

## 2016-08-27 LAB — BASIC METABOLIC PANEL
Anion gap: 12 (ref 5–15)
BUN: 21 mg/dL — ABNORMAL HIGH (ref 6–20)
CALCIUM: 9.2 mg/dL (ref 8.9–10.3)
CHLORIDE: 102 mmol/L (ref 101–111)
CO2: 20 mmol/L — AB (ref 22–32)
CREATININE: 1.73 mg/dL — AB (ref 0.61–1.24)
GFR calc Af Amer: 50 mL/min — ABNORMAL LOW (ref >60)
GFR calc non Af Amer: 43 mL/min — ABNORMAL LOW (ref >60)
GLUCOSE: 272 mg/dL — AB (ref 65–99)
Potassium: 3.8 mmol/L (ref 3.5–5.1)
Sodium: 134 mmol/L — ABNORMAL LOW (ref 135–145)

## 2016-08-27 LAB — CBC
HCT: 45.1 % (ref 39.0–52.0)
Hemoglobin: 15.7 g/dL (ref 13.0–17.0)
MCH: 30.6 pg (ref 26.0–34.0)
MCHC: 34.8 g/dL (ref 30.0–36.0)
MCV: 87.9 fL (ref 78.0–100.0)
PLATELETS: 349 10*3/uL (ref 150–400)
RBC: 5.13 MIL/uL (ref 4.22–5.81)
RDW: 12.8 % (ref 11.5–15.5)
WBC: 10.5 10*3/uL (ref 4.0–10.5)

## 2016-08-27 LAB — RAPID URINE DRUG SCREEN, HOSP PERFORMED
AMPHETAMINES: NOT DETECTED
BARBITURATES: NOT DETECTED
Benzodiazepines: NOT DETECTED
Cocaine: NOT DETECTED
OPIATES: NOT DETECTED
TETRAHYDROCANNABINOL: NOT DETECTED

## 2016-08-27 LAB — I-STAT TROPONIN, ED: TROPONIN I, POC: 0 ng/mL (ref 0.00–0.08)

## 2016-08-27 LAB — ETHANOL: Alcohol, Ethyl (B): <5 mg/dL (ref <5)

## 2016-08-27 LAB — SALICYLATE LEVEL: Salicylate Lvl: <7 mg/dL (ref 2.8–30.0)

## 2016-08-27 LAB — ACETAMINOPHEN LEVEL

## 2016-08-27 MED ORDER — ACETAMINOPHEN 325 MG PO TABS
650.0000 mg | ORAL_TABLET | ORAL | Status: DC | PRN
  Administered 2016-08-27: 650 mg via ORAL
  Filled 2016-08-27: qty 2

## 2016-08-27 MED ORDER — ONDANSETRON HCL 4 MG PO TABS: 4.0000 mg | ORAL_TABLET | Freq: Three times a day (TID) | ORAL | Status: DC | PRN

## 2016-08-27 MED ORDER — SIMVASTATIN 20 MG PO TABS
20.0000 mg | ORAL_TABLET | Freq: Every day | ORAL | Status: DC
  Administered 2016-08-27 – 2016-08-28 (×2): 20 mg via ORAL
  Filled 2016-08-27 (×3): qty 1

## 2016-08-27 MED ORDER — LOSARTAN POTASSIUM 50 MG PO TABS
100.0000 mg | ORAL_TABLET | Freq: Every day | ORAL | Status: DC
  Administered 2016-08-28: 100 mg via ORAL
  Filled 2016-08-27: qty 2

## 2016-08-27 MED ORDER — IBUPROFEN 400 MG PO TABS: 600.0000 mg | ORAL_TABLET | Freq: Three times a day (TID) | ORAL | Status: DC | PRN

## 2016-08-27 MED ORDER — DOXYCYCLINE HYCLATE 100 MG PO TABS
100.0000 mg | ORAL_TABLET | Freq: Two times a day (BID) | ORAL | Status: DC
  Administered 2016-08-28: 100 mg via ORAL
  Filled 2016-08-27: qty 1

## 2016-08-27 MED ORDER — INSULIN ASPART 100 UNIT/ML ~~LOC~~ SOLN: 1.1500 [IU] | Freq: Every day | SUBCUTANEOUS | Status: DC

## 2016-08-27 MED ORDER — HYDROCHLOROTHIAZIDE 25 MG PO TABS
12.5000 mg | ORAL_TABLET | Freq: Every day | ORAL | Status: DC
  Administered 2016-08-28: 12.5 mg via ORAL
  Filled 2016-08-27: qty 1

## 2016-08-27 MED ORDER — ESCITALOPRAM OXALATE 10 MG PO TABS
20.0000 mg | ORAL_TABLET | Freq: Every day | ORAL | Status: DC
  Administered 2016-08-28: 20 mg via ORAL
  Filled 2016-08-27: qty 2

## 2016-08-27 MED ORDER — LEVOTHYROXINE SODIUM 75 MCG PO TABS
75.0000 ug | ORAL_TABLET | Freq: Every day | ORAL | Status: DC
  Administered 2016-08-28: 75 ug via ORAL
  Filled 2016-08-27: qty 1

## 2016-08-27 MED ORDER — AMLODIPINE BESYLATE 5 MG PO TABS
5.0000 mg | ORAL_TABLET | Freq: Every day | ORAL | Status: DC
  Administered 2016-08-28: 5 mg via ORAL
  Filled 2016-08-27: qty 1

## 2016-08-27 MED ORDER — HYDROXYZINE HCL 50 MG PO TABS
50.0000 mg | ORAL_TABLET | Freq: Four times a day (QID) | ORAL | Status: DC | PRN
  Administered 2016-08-27 – 2016-08-28 (×2): 50 mg via ORAL
  Filled 2016-08-27: qty 1
  Filled 2016-08-27: qty 2

## 2016-08-27 MED ORDER — PANTOPRAZOLE SODIUM 40 MG PO TBEC
40.0000 mg | DELAYED_RELEASE_TABLET | Freq: Two times a day (BID) | ORAL | Status: DC
  Administered 2016-08-27 – 2016-08-28 (×2): 40 mg via ORAL
  Filled 2016-08-27 (×2): qty 1

## 2016-08-27 MED ORDER — ASPIRIN EC 81 MG PO TBEC
81.0000 mg | DELAYED_RELEASE_TABLET | Freq: Every day | ORAL | Status: DC
  Administered 2016-08-28: 81 mg via ORAL
  Filled 2016-08-27: qty 1

## 2016-08-27 MED ORDER — FLUTICASONE PROPIONATE 50 MCG/ACT NA SUSP
2.0000 | Freq: Every day | NASAL | Status: DC
  Administered 2016-08-28: 2 via NASAL
  Filled 2016-08-27: qty 16

## 2016-08-27 MED ORDER — ALUM & MAG HYDROXIDE-SIMETH 200-200-20 MG/5ML PO SUSP: 30.0000 mL | ORAL | Status: DC | PRN

## 2016-08-27 MED ORDER — LORATADINE 10 MG PO TABS
10.0000 mg | ORAL_TABLET | Freq: Every day | ORAL | Status: DC
  Administered 2016-08-28: 10 mg via ORAL
  Filled 2016-08-27: qty 1

## 2016-08-27 MED ORDER — ZOLPIDEM TARTRATE 5 MG PO TABS
5.0000 mg | ORAL_TABLET | Freq: Every evening | ORAL | Status: DC | PRN
  Administered 2016-08-27: 5 mg via ORAL
  Filled 2016-08-27: qty 1

## 2016-08-27 NOTE — ED Notes (Signed)
Pt given purple scrubs and all clothes given to mother.

## 2016-08-27 NOTE — BH Assessment (Signed)
Assessment Note  [name] is a Caucasian 56 y.o. male who presented to [facility] on a voluntary basis with complaint of suicidal ideation and other depressive symptoms.  Pt reported that he was last treated inpatient at [facility] in December 2014 for similar complaint.  Pt provided history.  Per report, Pt sent a message to his mother this morning which stated, "Please help me."  Mother came and took him to the hospital.  Pt reported for three weeks, he has experienced the following symptoms:  Suicidal ideation without current plan or intent ("I just wish I would fall asleep and never wake up"); persistent and unremitting despondency; isolation; insomnia; reduced appetite; feelings of hopelessness; body fatigue; "brain fog."  Pt stated that these symptoms are linked to his physical health -- Pt has diabetes and uses an insulin pump.  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  Pt reported feeling distressed due to his health.  "I just want this feeling to end."  Pt is not receiving any psychiatric treatment at this time.  He stated that he had a psychiatrist last year, but he no longer sees her.  Pt denied any substance use concerns.  UDS and BAC were not available during assessment.  During assessment, Pt presented as alert and oriented.  He was in scrubs and appeared appropriately groomed.  Pt's demeanor was calm.  Mood was depressed, and affect was mood-congruent.  Pt endorsed suicidal ideation and other depressive symptoms (see above).  Pt denied homicidal ideation, auditory/visual hallucination, self-injurious behavior, and substance use concerns.  Pt's speech was normal in rate, rhythm, and volume -- he used a trach to speak.  Pt's thought processes were within normal range, and thought content was goal-oriented and logical.  Pt's memory and concentration were normal and intact.  Pt's impulse control, judgment, and insight were fair.  Consulted with S. Rankin, NP who recommended inpatient  placement.  Diagnosis: Major Depressive Disorder, Recurrent, Severe, w/o psychotic features  Past Medical History:  Past Medical History:  Diagnosis Date  . Depression   . Diabetes mellitus without complication ([place])   . GERD (gastroesophageal reflux disease)   . H/O laryngectomy   . Hyperlipidemia   . Hypertension   . Thyroid disease     History reviewed. No pertinent surgical history.  Family History: History reviewed. No pertinent family history.  Social History:  has an unknown smoking status. He has never used smokeless tobacco. He reports that he does not drink alcohol or use drugs.  Additional Social History:  Alcohol / Drug Use Pain Medications: See PTA Prescriptions: See PTA Over the Counter: See PTA  CIWA: CIWA-Ar BP: 135/80 Pulse Rate: 68 COWS:    Allergies:  Allergies  Allergen Reactions  . Buspar [Buspirone]     "freaked out"  . Clopidogrel Rash  . Gabapentin Itching and Rash    Home Medications:  (Not in a hospital admission)  OB/GYN Status:  No LMP for male patient.  General Assessment Data Location of Assessment: [facility] ED TTS Assessment: In system Is this a Tele or Face-to-Face Assessment?: Tele Assessment Is this an Initial Assessment or a Re-assessment for this encounter?: Initial Assessment Marital status: Single Living Arrangements: Alone Can pt return to current living arrangement?: Yes Admission Status: Voluntary Is patient capable of signing voluntary admission?: Yes Referral Source: Self/Family/Friend Insurance type: [employer]     Crisis Care Plan Living Arrangements: Alone Name of Psychiatrist: None currently Name of Therapist: None currently  Education Status Is  patient currently in school?: No  Risk to self with the past 6 months Suicidal Ideation: Yes-Currently Present Has patient been a risk to self within the past 6 months prior to admission? : Yes Suicidal Intent: No Has patient had any suicidal intent  within the past 6 months prior to admission? : No Is patient at risk for suicide?: Yes Suicidal Plan?: No Has patient had any suicidal plan within the past 6 months prior to admission? : No Access to Means: No What has been your use of drugs/alcohol within the last 12 months?: Pt denied (UDS/BAL not avaialble) Previous Attempts/Gestures: No Intentional Self Injurious Behavior: None Family Suicide History: No Recent stressful life event(s): Recent negative physical changes Persecutory voices/beliefs?: No Depression: Yes Depression Symptoms: Despondent, Insomnia, Isolating, Fatigue, Feeling worthless/self pity Substance abuse history and/or treatment for substance abuse?: No Suicide prevention information given to non-admitted patients: Not applicable  Risk to Others within the past 6 months Homicidal Ideation: No Does patient have any lifetime risk of violence toward others beyond the six months prior to admission? : No Thoughts of Harm to Others: No Current Homicidal Intent: No Current Homicidal Plan: No Access to Homicidal Means: No History of harm to others?: No Assessment of Violence: None Noted Does patient have access to weapons?: No Criminal Charges Pending?: No Does patient have a court date: No Is patient on probation?: No  Psychosis Hallucinations: None noted Delusions: None noted  Mental Status Report Appearance/Hygiene: Unremarkable Eye Contact: Fair Motor Activity: Freedom of movement, Unremarkable Speech: Logical/coherent, Unremarkable Level of Consciousness: Alert Mood: Depressed Affect: Appropriate to circumstance Anxiety Level: None Thought Processes: Coherent, Relevant Judgement: Partial Orientation: Person, Time, Place, Situation Obsessive Compulsive Thoughts/Behaviors: None  Cognitive Functioning Concentration: Good Memory: Remote Intact, Recent Intact IQ: Average Insight: Fair Impulse Control: Fair Appetite: Fair Sleep: Decreased Total Hours  of Sleep: 3 Vegetative Symptoms: None  ADLScreening [facility] Assessment Services) Patient's cognitive ability adequate to safely complete daily activities?: Yes Patient able to express need for assistance with ADLs?: Yes Independently performs ADLs?: Yes (appropriate for developmental age)  Prior Inpatient Therapy Prior Inpatient Therapy: Yes Prior Therapy Dates: December 2014 Prior Therapy Facilty/Provider(s): [facility] Reason for Treatment: Depression  Prior Outpatient Therapy Prior Outpatient Therapy: Yes Prior Therapy Dates: 2017 Prior Therapy Facilty/Provider(s): Private psychiatrist Reason for Treatment: Depression Does patient have an ACCT team?: No Does patient have Intensive In-House Services?  : No Does patient have [facility] services? : No Does patient have P4CC services?: No  ADL Screening (condition at time of admission) Patient's cognitive ability adequate to safely complete daily activities?: Yes Is the patient deaf or have difficulty hearing?: No Does the patient have difficulty seeing, even when wearing glasses/contacts?: No Does the patient have difficulty concentrating, remembering, or making decisions?: No Patient able to express need for assistance with ADLs?: Yes Does the patient have difficulty dressing or bathing?: No Independently performs ADLs?: Yes (appropriate for developmental age) Does the patient have difficulty walking or climbing stairs?: No Weakness of Legs: None Weakness of Arms/Hands: None  Home Assistive Devices/Equipment Home Assistive Devices/Equipment: None (Pt stated he uses an insulin pump; also, he has laryngectomy)  Therapy Consults (therapy consults require a physician order) PT Evaluation Needed: No OT Evalulation Needed: No SLP Evaluation Needed: No Abuse/Neglect Assessment (Assessment to be complete while patient is alone) Physical Abuse: Denies Verbal Abuse: Denies Sexual Abuse: Denies Exploitation of patient/patient's resources:  Denies Self-Neglect: Denies Values / Beliefs Cultural Requests During Hospitalization: None Spiritual Requests During Hospitalization: None  Consults Spiritual Care Consult Needed: No Social Work Consult Needed: No Regulatory affairs officer (For Healthcare) Does Patient Have a Medical Advance Directive?: No    Additional Information 1:1 In Past 12 Months?: No CIRT Risk: No Elopement Risk: No Does patient have medical clearance?: Yes     Disposition:  Disposition Initial Assessment Completed for this Encounter: Yes Disposition of Patient: Inpatient treatment program Type of inpatient treatment program: Adult (Per S. Rankin, NP, Pt meets inpt criteria)  On Site Evaluation by:   Reviewed with Physician:    Laurena Slimmer Charmon Thorson 08/27/2016 4:18 PM

## 2016-08-27 NOTE — ED Notes (Signed)
Staffing Office aware of need for Sitter. Advised will have Sitter at 1900.

## 2016-08-27 NOTE — ED Provider Notes (Signed)
[facility] DEPT Provider Note   CSN: [id number] Arrival date & time: 08/27/16  1155     History   Chief Complaint Chief Complaint  Patient presents with  . Depression  . Shortness of Breath    HPI [name] is a 56 y.o. male. With a past medical history of , diabetes, depression, and a laryngectomy. The patient presents emergency department for depression and suicidal ideation, along with anxiety. He and his mother give history. The patient's laryngectomy occurred after he was found down for an unknown amount of time and had complications from an intubation. His mother states that they were concerned he may have overdosed on his father's pain medication because  His father had 30 tablets missing.  The patient states he has no recollection even of his hospitalization and as he was in ICU on a ventilator for some time. The patient complains that he has been extremely and hypotonic, he has been really unable to complete his tasks of daily living including bleeding, showering, eating. He is extremely anxious and barely able to leave his house. He was seeing a psychiatrist outpatient to his citalopram from 10-20 mg daily added BuSpar. Patient states that when he began the BuSpar. He became extremely agitated and states "I really wanted to kill myself."  And he immediately stopped taking it. He has not been sleeping well.  He was prescribed mirtazapine by his psychiatrist and states that 1 night he could not sleep so he took one every hour and never got to sleep. He was then dismissed in by his psychiatrist for overuse of his medications.  He feels that he has some modest sizing a lot of his anxiety. He has been previously  Admitted to the psych hospital at [facility] for  Clinical depression. He states that he does not want to go back to [facility]. He does feel that he frequently just wants to die and not be alive anymore. He does not have an active plan of suicide. He denies  homicidal ideation or audiovisual hallucinations.  HPI  Past Medical History:  Diagnosis Date  . Depression   . Diabetes mellitus without complication ([place])   . GERD (gastroesophageal reflux disease)   . H/O laryngectomy   . Hyperlipidemia   . Hypertension   . Thyroid disease     There are no active problems to display for this patient.   History reviewed. No pertinent surgical history.     Home Medications    Prior to Admission medications   Medication Sig Start Date End Date Taking? Authorizing Provider  amLODipine (NORVASC) 10 MG tablet Take 5 mg by mouth daily.   Yes Historical Provider, MD  aspirin EC 81 MG tablet Take 1 tablet by mouth daily.   Yes Historical Provider, MD  Diphenhydramine-APAP, sleep, (TYLENOL PM EXTRA STRENGTH PO) Take 1 tablet by mouth at bedtime as needed.   Yes Historical Provider, MD  doxycycline (VIBRA-TABS) 100 MG tablet Take 100 mg by mouth 2 (two) times daily. Started on 08-21-16 for 10 days   Yes Historical Provider, MD  escitalopram (LEXAPRO) 10 MG tablet Take 20 mg by mouth daily.   Yes Historical Provider, MD  Fexofenadine HCl (ALLEGRA PO) Take 180 mg by mouth daily.    Yes Historical Provider, MD  fluticasone (FLONASE) 50 MCG/ACT nasal spray Place 2 sprays into both nostrils daily.   Yes Historical Provider, MD  hydrochlorothiazide (HYDRODIURIL) 12.5 MG tablet Take 12.5 mg by mouth daily. 02/15/15  Yes  Historical Provider, MD  levothyroxine (SYNTHROID, LEVOTHROID) 75 MCG tablet Take 75 mcg by mouth daily before breakfast.   Yes Historical Provider, MD  losartan (COZAAR) 100 MG tablet Take 1 tablet by mouth daily. 04/26/16  Yes Historical Provider, MD  pantoprazole (PROTONIX) 40 MG tablet Take 1 tablet by mouth 2 (two) times daily. 05/09/16  Yes Historical Provider, MD  simvastatin (ZOCOR) 20 MG tablet Take 1 tablet by mouth daily.   Yes Historical Provider, MD  amLODipine (NORVASC) 5 MG tablet Take 5 mg by mouth daily.    Historical Provider,  MD  busPIRone (BUSPAR) 10 MG tablet Take 1 tablet by mouth 2 (two) times daily.    Historical Provider, MD  insulin aspart (NOVOLOG) 100 UNIT/ML injection Inject 1.15 Units into the skin daily. Via insulin pump-basal rate 1.15-1.25 units/hr 06/26/16   Historical Provider, MD    Family History History reviewed. No pertinent family history.  Social History Social History  Substance Use Topics  . Smoking status: Unknown If Ever Smoked  . Smokeless tobacco: Never Used  . Alcohol use No     Comment: Pt denied; BAC not available     Allergies   Buspar [buspirone]; Clopidogrel; and Gabapentin   Review of Systems Review of Systems  Ten systems reviewed and are negative for acute change, except as noted in the HPI.   Physical Exam Updated Vital Signs BP (!) 157/90   Pulse 66   Temp 98.5 F (36.9 C) (Oral)   Resp 14   SpO2 98%   Physical Exam  Constitutional: He appears well-developed and well-nourished. No distress.  HENT:  Head: Normocephalic and atraumatic.  Eyes: Conjunctivae are normal. No scleral icterus.  Neck: Normal range of motion. Neck supple.  trachoeostomy in place  Cardiovascular: Normal rate, regular rhythm and normal heart sounds.   Pulmonary/Chest: Effort normal and breath sounds normal. No respiratory distress.  Abdominal: Soft. There is no tenderness.  Musculoskeletal: He exhibits no edema.  Neurological: He is alert.  Skin: Skin is warm and dry. He is not diaphoretic.  Psychiatric: His behavior is normal.  anxious  Nursing note and vitals reviewed.    ED Treatments / Results  Labs (all labs ordered are listed, but only abnormal results are displayed) Labs Reviewed  BASIC METABOLIC PANEL - Abnormal; Notable for the following:       Result Value   Sodium 134 (*)    CO2 20 (*)    Glucose, Bld 272 (*)    BUN 21 (*)    Creatinine, Ser 1.73 (*)    GFR calc non Af Amer 43 (*)    GFR calc Af Amer 50 (*)    All other components within normal limits    ACETAMINOPHEN LEVEL - Abnormal; Notable for the following:    Acetaminophen (Tylenol), Serum <10 (*)    All other components within normal limits  CBC  ETHANOL  SALICYLATE LEVEL  RAPID URINE DRUG SCREEN, [place], ED    EKG  EKG Interpretation None       Radiology Dg Chest 2 View  Result Date: 08/27/2016 CLINICAL DATA:  Increased shortness of breath. Chest pressure. Increased anxiety. EXAM: CHEST  2 VIEW COMPARISON:  None. FINDINGS: Normal sized heart. Clear lungs. Diffuse peribronchial thickening. Mild thoracic spine degenerative changes. IMPRESSION: Mild to moderate bronchitic changes. Electronically Signed   By: Claudie Revering M.D.   On: 08/27/2016 12:59    Procedures Procedures (including critical care time)  Medications Ordered  in ED Medications  alum & mag hydroxide-simeth (MAALOX/MYLANTA) 200-200-20 MG/5ML suspension 30 mL (not administered)  ondansetron (ZOFRAN) tablet 4 mg (not administered)  acetaminophen (TYLENOL) tablet 650 mg (not administered)  ibuprofen (ADVIL,MOTRIN) tablet 600 mg (not administered)  hydrOXYzine (ATARAX/VISTARIL) tablet 50 mg (50 mg Oral Given 08/27/16 1529)     Initial Impression / Assessment and Plan / ED Course  I have reviewed the triage vital signs and the nursing notes.  Pertinent labs & imaging results that were available during my care of the patient were reviewed by me and considered in my medical decision making (see chart for details).      Patient with depression, anxiety. I've ordered a psych consult and he appears medically clear.  Final Clinical Impressions(s) / ED Diagnoses   Final diagnoses:  Depression, unspecified depression type  Anxiety    New Prescriptions New Prescriptions   No medications on file     Margarita Mail, PA-C 08/27/16 [place], MD 08/27/16 1801

## 2016-08-27 NOTE — ED Notes (Signed)
Security called to have pt wanded and then will move to POD C.

## 2016-08-27 NOTE — ED Notes (Signed)
Pt informed to use call bell when he can provide a urine sample.

## 2016-08-27 NOTE — ED Notes (Addendum)
Pt arrived to [facility] - ambulatory w/RN. Pt's belongings - 1 black backpack and 1 labeled belongings bag placed at nurses' desk for inventory. Pt has insulin pump intact. States is unable to remove insulin pump d/t unable to control BS'. States was at [facility] x 2 - last 2014 and had bad experience when he had to remove it so they allowed him to place it back on. Pt alert, oriented, cooperative. Pt verbalized understanding and signed Medical Clearance Pt Policy form - copy given to pt. Pt also completed "Release of Info" form for his mother to have information. Copy faxed to [facility], copy sent to Medical Records, and copy placed on chart. Pt noted w/trache - pt able to place finger on it to talk. States has to change filter daily. Has supplies in back pack and his mother to bring additional supplies in am.

## 2016-08-27 NOTE — ED Triage Notes (Signed)
Pt has multiple complaints. Per family, pt left a note saying "please help me" today. Pt is a trach pt, reports recent increase in sob and chest pressure. Has been to pcp and had negative exam. Family reports pt having depression and increase in symptoms, increase in anxiety. Pt reports "feeling like he is sick and going to die" denies having suicidal thoughts.

## 2016-08-27 NOTE — ED Notes (Signed)
Shower supplies given

## 2016-08-27 NOTE — Progress Notes (Signed)
Patient has been referred for inpatient treatment at the following facilities: [facility], [facility], [facility], and [facility].  At capacity: [facility], [facility], [facility], [facility], [facility], [facility].  CSW in disposition will continue to follow up and seek placement.  Verlon Setting, [facility] Disposition staff 08/27/2016 4:39 PM

## 2016-08-27 NOTE — ED Notes (Signed)
Pt checked his CBG 52 - took glucose tabs x 2 - CBG 53 - 15 min later. Pt turned off insulin pump. Pt given Caff-Free Coke - rechecked 100. Pt states feels better. Pt turned pump back on. Pt talking w/sitter and RN - laughing.

## 2016-08-27 NOTE — ED Notes (Signed)
[name] [phone] Mothers house phone  [phone] cell phone

## 2016-08-27 NOTE — ED Notes (Signed)
Pt is very anxious and walking around room. Abigail PA made aware.

## 2016-08-27 NOTE — ED Notes (Addendum)
Pt's belongings inventoried - backpack and 1 labeled belongings bag w/pt's shoes placed in lower cabinet - locked. Pt's home meds sent to [facility]. Pt has glucometer at bedside - checks his sugar via device on right upper arm and has insulin pump - intact. Mother took pt's cell phone and clothing.

## 2016-08-27 NOTE — ED Notes (Signed)
TTS machine at beside. 

## 2016-08-28 ENCOUNTER — Telehealth: Payer: Self-pay | Admitting: Dietician

## 2016-08-28 NOTE — ED Notes (Signed)
Pt eating lunch and given all belongings back in anticipated discharge.

## 2016-08-28 NOTE — ED Notes (Signed)
Reheated pt's breakfast.  Pt currently eating.

## 2016-08-28 NOTE — Consult Note (Signed)
Telepsych Consultation   Reason for Consult:  Depression /anxiety  Referring Physician:  EPD Patient Identification: [name] MRN:  [id number] Principal Diagnosis: <principal problem not specified> Diagnosis:  There are no active problems to display for this patient.   Total Time spent with patient: 30 minutes  Subjective:   [name] is a 56 y.o. male reports history of depression and anxiety. Patient is denying suicidal or homicidal ideation during this assessment. Patient reports I need my medications adjustment for my anxiety. Reports he was recently started on Buspar and reports " I went crazy that medication didn't help me."  Patient reports a picnic attack on yesterday. Reports he is followed by Dr. Joni Fears. Patient reports he is able to get a follow-up appointment with his outpatient provided.   HPI:  [name] is a Caucasian 56 y.o. male who presented to [facility] on a voluntary basis with complaint of suicidal ideation and other depressive symptoms.  Pt reported that he was last treated inpatient at [facility] in December 2014 for similar complaint.  Pt provided history.  Per report, Pt sent a message to his mother this morning which stated, "Please help me."  Mother came and took him to the hospital.  Pt reported for three weeks, he has experienced the following symptoms:  Suicidal ideation without current plan or intent ("I just wish I would fall asleep and never wake up"); persistent and unremitting despondency; isolation; insomnia; reduced appetite; feelings of hopelessness; body fatigue; "brain fog."  Pt stated that these symptoms are linked to his physical health -- Pt has diabetes and uses an insulin pump.  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  Pt reported feeling distressed due to his health.  "I just want this feeling to end."  Pt is not receiving any psychiatric treatment at this time.  He stated that he had a psychiatrist last year, but he no longer sees her.   Pt denied any substance use concerns.  UDS and BAC were not available during assessment.  During assessment, Pt presented as alert and oriented.  He was in scrubs and appeared appropriately groomed.  Pt's demeanor was calm.  Mood was depressed, and affect was mood-congruent.  Pt endorsed suicidal ideation and other depressive symptoms (see above).  Pt denied homicidal ideation, auditory/visual hallucination, self-injurious behavior, and substance use concerns.  Pt's speech was normal in rate, rhythm, and volume -- he used a trach to speak.  Pt's thought processes were within normal range, and thought content was goal-oriented and logical.  Pt's memory and concentration were normal and intact.  Pt's impulse control, judgment, and insight were fair.  Past Psychiatric History:   Risk to Self: Suicidal Ideation: Yes-Currently Present Suicidal Intent: No Is patient at risk for suicide?: Yes Suicidal Plan?: No Access to Means: No What has been your use of drugs/alcohol within the last 12 months?: Pt denied (UDS/BAL not avaialble) Intentional Self Injurious Behavior: None Risk to Others: Homicidal Ideation: No Thoughts of Harm to Others: No Current Homicidal Intent: No Current Homicidal Plan: No Access to Homicidal Means: No History of harm to others?: No Assessment of Violence: None Noted Does patient have access to weapons?: No Criminal Charges Pending?: No Does patient have a court date: No Prior Inpatient Therapy: Prior Inpatient Therapy: Yes Prior Therapy Dates: December 2014 [facility] Reason for Treatment: Depression Prior Outpatient Therapy: Prior Outpatient Therapy: Yes Prior Therapy Dates: 2017 Prior Therapy Facilty/Provider(s): Private psychiatrist Reason for Treatment: Depression  Does patient have an ACCT team?: No Does patient have Intensive In-House Services?  : No Does patient have [facility] services? : No Does patient have P4CC services?:  No  Past Medical History:  Past Medical History:  Diagnosis Date  . Depression   . Diabetes mellitus without complication ([place])   . GERD (gastroesophageal reflux disease)   . H/O laryngectomy   . Hyperlipidemia   . Hypertension   . Thyroid disease    History reviewed. No pertinent surgical history. Family History: History reviewed. No pertinent family history. Family Psychiatric  History:  Social History:  History  Alcohol Use No    Comment: Pt denied; BAC not available     History  Drug Use No    Comment: Pt denied; UDS not available    Social History   Social History  . Marital status: Single    Spouse name: N/A  . Number of children: N/A  . Years of education: N/A   Social History Main Topics  . Smoking status: Unknown If Ever Smoked  . Smokeless tobacco: Never Used  . Alcohol use No     Comment: Pt denied; BAC not available  . Drug use: No     Comment: Pt denied; UDS not available  . Sexual activity: No   Other Topics Concern  . None   Social History Narrative  . None   Additional Social History:    Allergies:   Allergies  Allergen Reactions  . Buspar [Buspirone]     "freaked out"  . Clopidogrel Rash  . Gabapentin Itching and Rash    Labs:  Results for orders placed or performed during the hospital encounter of 08/27/16 (from the past 48 hour(s))  Basic metabolic panel     Status: Abnormal   Collection Time: 08/27/16 12:22 PM  Result Value Ref Range   Sodium 134 (L) 135 - 145 mmol/L   Potassium 3.8 3.5 - 5.1 mmol/L   Chloride 102 101 - 111 mmol/L   CO2 20 (L) 22 - 32 mmol/L   Glucose, Bld 272 (H) 65 - 99 mg/dL   BUN 21 (H) 6 - 20 mg/dL   Creatinine, Ser 1.73 (H) 0.61 - 1.24 mg/dL   Calcium 9.2 8.9 - 10.3 mg/dL   GFR calc non Af Amer 43 (L) >60 mL/min   GFR calc Af Amer 50 (L) >60 mL/min    Comment: (NOTE) The eGFR has been calculated using the CKD EPI equation. This calculation has not been validated in all clinical situations. eGFR's  persistently <60 mL/min signify possible Chronic Kidney Disease.    Anion gap 12 5 - 15  CBC     Status: None   Collection Time: 08/27/16 12:22 PM  Result Value Ref Range   WBC 10.5 4.0 - 10.5 K/uL   RBC 5.13 4.22 - 5.81 MIL/uL   Hemoglobin 15.7 13.0 - 17.0 g/dL   HCT 45.1 39.0 - 52.0 %   MCV 87.9 78.0 - 100.0 fL   MCH 30.6 26.0 - 34.0 pg   MCHC 34.8 30.0 - 36.0 g/dL   RDW 12.8 11.5 - 15.5 %   Platelets 349 150 - 400 K/uL  I-stat troponin, ED     Status: None   Collection Time: 08/27/16  1:01 PM  Result Value Ref Range   Troponin i, poc 0.00 0.00 - 0.08 ng/mL   Comment 3            Comment: Due to the release kinetics of cTnI,  a negative result within the first hours of the onset of symptoms does not rule out myocardial infarction with certainty. If myocardial infarction is still suspected, repeat the test at appropriate intervals.   Ethanol     Status: None   Collection Time: 08/27/16  3:16 PM  Result Value Ref Range   Alcohol, Ethyl (B) <5 <5 mg/dL    Comment:        LOWEST DETECTABLE LIMIT FOR SERUM ALCOHOL IS 5 mg/dL FOR MEDICAL PURPOSES ONLY   Salicylate level     Status: None   Collection Time: 08/27/16  3:16 PM  Result Value Ref Range   Salicylate Lvl <9.4 2.8 - 30.0 mg/dL  Acetaminophen level     Status: Abnormal   Collection Time: 08/27/16  3:16 PM  Result Value Ref Range   Acetaminophen (Tylenol), Serum <10 (L) 10 - 30 ug/mL    Comment:        THERAPEUTIC CONCENTRATIONS VARY SIGNIFICANTLY. A RANGE OF 10-30 ug/mL MAY BE AN EFFECTIVE CONCENTRATION FOR MANY PATIENTS. HOWEVER, SOME ARE BEST TREATED AT CONCENTRATIONS OUTSIDE THIS RANGE. ACETAMINOPHEN CONCENTRATIONS >150 ug/mL AT 4 HOURS AFTER INGESTION AND >50 ug/mL AT 12 HOURS AFTER INGESTION ARE OFTEN ASSOCIATED WITH TOXIC REACTIONS.   Urine rapid drug screen (hosp performed)not at [facility]     Status: None   Collection Time: 08/27/16  4:44 PM  Result Value Ref Range   Opiates NONE DETECTED NONE  DETECTED   Cocaine NONE DETECTED NONE DETECTED   Benzodiazepines NONE DETECTED NONE DETECTED   Amphetamines NONE DETECTED NONE DETECTED   Tetrahydrocannabinol NONE DETECTED NONE DETECTED   Barbiturates NONE DETECTED NONE DETECTED    Comment:        DRUG SCREEN FOR MEDICAL PURPOSES ONLY.  IF CONFIRMATION IS NEEDED FOR ANY PURPOSE, NOTIFY LAB WITHIN 5 DAYS.        LOWEST DETECTABLE LIMITS FOR URINE DRUG SCREEN Drug Class       Cutoff (ng/mL) Amphetamine      1000 Barbiturate      200 Benzodiazepine   709 Tricyclics       628 Opiates          300 Cocaine          300 THC              50     Current Facility-Administered Medications  Medication Dose Route Frequency Provider Last Rate Last Dose  . acetaminophen (TYLENOL) tablet 650 mg  650 mg Oral Q4H PRN Margarita Mail, PA-C   650 mg at 08/27/16 2204  . alum & mag hydroxide-simeth (MAALOX/MYLANTA) 200-200-20 MG/5ML suspension 30 mL  30 mL Oral PRN Margarita Mail, PA-C      . amLODipine (NORVASC) tablet 5 mg  5 mg Oral Daily Margarita Mail, PA-C   5 mg at 08/28/16 1039  . aspirin EC tablet 81 mg  81 mg Oral Daily Margarita Mail, PA-C   81 mg at 08/28/16 1039  . doxycycline (VIBRA-TABS) tablet 100 mg  100 mg Oral BID Vivi Barrack, MD   100 mg at 08/28/16 1036  . escitalopram (LEXAPRO) tablet 20 mg  20 mg Oral Daily Margarita Mail, PA-C   20 mg at 08/28/16 1036  . fluticasone (FLONASE) 50 MCG/ACT nasal spray 2 spray  2 spray Each Nare Daily Margarita Mail, PA-C   2 spray at 08/28/16 1035  . hydrochlorothiazide (HYDRODIURIL) tablet 12.5 mg  12.5 mg Oral Daily Abigail Harris, PA-C   12.5 mg at  08/28/16 1036  . hydrOXYzine (ATARAX/VISTARIL) tablet 50 mg  50 mg Oral QID PRN Margarita Mail, PA-C   50 mg at 08/28/16 0210  . ibuprofen (ADVIL,MOTRIN) tablet 600 mg  600 mg Oral Q8H PRN Margarita Mail, PA-C      . insulin aspart (novoLOG) injection 1 Units  1 Units Subcutaneous Daily Abigail Harris, PA-C      . levothyroxine (SYNTHROID,  LEVOTHROID) tablet 75 mcg  75 mcg Oral QAC breakfast Margarita Mail, PA-C   75 mcg at 08/28/16 0813  . loratadine (CLARITIN) tablet 10 mg  10 mg Oral Daily Margarita Mail, PA-C   10 mg at 08/28/16 1039  . losartan (COZAAR) tablet 100 mg  100 mg Oral Daily Margarita Mail, PA-C   100 mg at 08/28/16 1038  . ondansetron (ZOFRAN) tablet 4 mg  4 mg Oral Q8H PRN Margarita Mail, PA-C      . pantoprazole (PROTONIX) EC tablet 40 mg  40 mg Oral BID Margarita Mail, PA-C   40 mg at 08/28/16 1036  . simvastatin (ZOCOR) tablet 20 mg  20 mg Oral Daily Margarita Mail, PA-C   20 mg at 08/28/16 1040  . zolpidem (AMBIEN) tablet 5 mg  5 mg Oral QHS PRN Leo Grosser, MD   5 mg at 08/27/16 2204   Current Outpatient Prescriptions  Medication Sig Dispense Refill  . amLODipine (NORVASC) 10 MG tablet Take 5 mg by mouth daily.    Marland Kitchen aspirin EC 81 MG tablet Take 1 tablet by mouth daily.    . Diphenhydramine-APAP, sleep, (TYLENOL PM EXTRA STRENGTH PO) Take 1 tablet by mouth at bedtime as needed.    . doxycycline (VIBRA-TABS) 100 MG tablet Take 100 mg by mouth 2 (two) times daily. Started on 08-21-16 for 10 days    . escitalopram (LEXAPRO) 10 MG tablet Take 20 mg by mouth daily.    Marland Kitchen Fexofenadine HCl (ALLEGRA PO) Take 180 mg by mouth daily.     . fluticasone (FLONASE) 50 MCG/ACT nasal spray Place 2 sprays into both nostrils daily.    . hydrochlorothiazide (HYDRODIURIL) 12.5 MG tablet Take 12.5 mg by mouth daily.    Marland Kitchen levothyroxine (SYNTHROID, LEVOTHROID) 75 MCG tablet Take 75 mcg by mouth daily before breakfast.    . losartan (COZAAR) 100 MG tablet Take 1 tablet by mouth daily.    . pantoprazole (PROTONIX) 40 MG tablet Take 1 tablet by mouth 2 (two) times daily.    . simvastatin (ZOCOR) 20 MG tablet Take 1 tablet by mouth daily.    Marland Kitchen amLODipine (NORVASC) 5 MG tablet Take 5 mg by mouth daily.    . insulin aspart (NOVOLOG) 100 UNIT/ML injection Inject 1.15 Units into the skin daily. Via insulin pump-basal rate 1.15-1.25  units/hr      Musculoskeletal: Strength & Muscle Tone: UTA Gait & Station: UTA Patient leans: UAT  Psychiatric Specialty Exam: Physical Exam  Constitutional: He is oriented to person, place, and time.  Neurological: He is alert and oriented to person, place, and time.  Psychiatric: He has a normal mood and affect. His behavior is normal.    Review of Systems  Psychiatric/Behavioral: Negative for hallucinations and suicidal ideas. Depression: stable. Nervous/anxious: stable.     Blood pressure 136/87, pulse 76, temperature 98.1 F (36.7 C), temperature source Oral, resp. rate 18, SpO2 100 %.There is no height or weight on file to calculate BMI.  General Appearance: Casual  Eye Contact:  Fair  Speech:  Clear and Coherent  Volume:  Normal  Mood:  Anxious  Affect:  Congruent  Thought Process:  Coherent  Orientation:  Full (Time, Place, and Person)  Thought Content:  Hallucinations: None  Suicidal Thoughts:  No  Homicidal Thoughts:  No  Memory:  Immediate;   Fair Remote;   Fair  Judgement:  Fair  Insight:  Fair  Psychomotor Activity:  Normal  Concentration:  Concentration: Fair  Recall:  [employer] of Knowledge:  Fair  Language:  Fair  Akathisia:  No  Handed:  Right  AIMS (if indicated):     Assets:  Communication Skills Desire for Improvement Social Support  ADL's:  Intact  Cognition:  WNL  Sleep:        I agree with current treatment plan on 08/28/2016, Patient seen face-to-face for psychiatric evaluation follow-up, chart reviewed and case discussed with the MD [place], Advanced Practice Provider and Treatment team. Reviewed the information documented and agree with the disposition. Discussed dispostion with MD [place]    Disposition: No evidence of imminent risk to self or others at present.   Patient does not meet criteria for psychiatric inpatient admission. Refer to IOP. Discussed crisis plan, support from social network, calling 911, coming to the Emergency  Department, and calling Suicide Hotline.   Patient to get f/u appt with outpatient provider  Derrill Center, NP 08/28/2016 12:23 PM

## 2016-08-28 NOTE — ED Notes (Signed)
Pt reports CBG is 84.  Ambulated to the restroom.

## 2016-08-28 NOTE — ED Notes (Signed)
Vital signs not assessed because pt was up all night long pacing and finally fell asleep.

## 2016-08-28 NOTE — ED Notes (Signed)
Pass out snack

## 2016-08-28 NOTE — Telephone Encounter (Signed)
Called pt for FU-pt reports he had "mental breakdown" and was hospitalized at [facility] recently and was discharged today.  He is to FU with therapist on 09-12-16 and psychiatrist on 10-2016. Reports BG's are doing much better since using new  sites in abdomen for pump and taking meal boluses 15 min. prior to eating meals.  Still expresses feeling overwhelmed with all he has to manage with his care. Advised him if he feels depression is worsening again,  to call MD for help. Advised to call us for assistance if issues arise.

## 2016-08-31 DIAGNOSIS — F1421 Cocaine dependence, in remission: Secondary | ICD-10-CM | POA: Diagnosis not present

## 2016-08-31 DIAGNOSIS — F39 Unspecified mood [affective] disorder: Secondary | ICD-10-CM | POA: Diagnosis not present

## 2016-08-31 DIAGNOSIS — F1021 Alcohol dependence, in remission: Secondary | ICD-10-CM | POA: Diagnosis not present

## 2016-08-31 DIAGNOSIS — G4700 Insomnia, unspecified: Secondary | ICD-10-CM | POA: Diagnosis not present

## 2016-09-07 ENCOUNTER — Encounter: Payer: Self-pay | Admitting: Dietician

## 2016-09-07 ENCOUNTER — Encounter: Payer: PPO | Attending: Internal Medicine | Admitting: Dietician

## 2016-09-07 VITALS — BP 110/76 | Ht 72.0 in | Wt 239.7 lb

## 2016-09-07 DIAGNOSIS — E119 Type 2 diabetes mellitus without complications: Secondary | ICD-10-CM | POA: Diagnosis not present

## 2016-09-07 DIAGNOSIS — E1021 Type 1 diabetes mellitus with diabetic nephropathy: Secondary | ICD-10-CM

## 2016-09-07 DIAGNOSIS — Z713 Dietary counseling and surveillance: Secondary | ICD-10-CM | POA: Diagnosis not present

## 2016-09-07 NOTE — Progress Notes (Signed)
Diabetes Self-Management Education  Visit Type:  Comprehensive  Appt. Start Time: 1500 Appt. End Time: 1600  09/07/2016  [name], identified by name and date of birth, is a 56 y.o. male with a diagnosis of Diabetes: Type 1. His mother accompanied him during this visit  .   ASSESSMENT  Blood pressure 110/76, height 6' (1.829 m), weight 239 lb 11.2 oz (108.7 kg). Body mass index is 32.51 kg/m.       Diabetes Self-Management Education - 26/71/24 5809      Complications   How often do you check your blood sugar? > 4 times/day   Fasting Blood glucose range (mg/dL) <70;70-129;130-179;180-200;>200   Postprandial Blood glucose range (mg/dL) <70;70-129;130-179;180-200;>200   Number of hypoglycemic episodes per month 3   Can you tell when your blood sugar is low? Yes   What do you do if your blood sugar is low? halts pump and eats a snack or meal   Have you had a dilated eye exam in the past 12 months? Yes  appointment rescheduled to 10/2016   Have you had a dental exam in the past 12 months? Yes   Are you checking your feet? Yes   How many days per week are you checking your feet? 7     Dietary Intake   Breakfast often skips, feels nauseated when eating  very poor appetite and motivation recently due to depression   Lunch sometimes skips   Dinner meat, starch and veg/fruit, soup; small amounts recently due to poor appetite     Exercise   Exercise Type Other (comment)  none; sometimes stays in bed all day due to depression     Patient Education   Nutrition management  Carbohydrate counting;Meal timing in regards to the patients' current diabetes medication.;Meal options for control of blood glucose level and chronic complications.;Other (comment)  guidelines for poor appetite   Psychosocial adjustment Worked with patient to identify barriers to care and solutions;Other (comment)  importance of efforts to improve depression   Personal strategies to promote health Lifestyle  issues that need to be addressed for better diabetes care      Learning Objective:  Patient will have a greater understanding of diabetes self-management. Patient education plan is to attend individual and/or group sessions per assessed needs and concerns.  Patient reports recent hospital admission (08/27/16) for panic attack and depression. He states that he is not hungry and can't make himself eat when he is not hungry. He also reports nausea at times after eating. His mother is supportive and brings him food, but he does not always eat it. He has lost almost 12lbs in the past 2 1/2 weeks. He has an appointment scheduled with a therapist next week.   Plan:   Patient Instructions   Try some frozen microwaveable meals such as Lean cuisine, Healthy Choice, or others.   Try a nutrition drink such as boost for breakfast or lunch.   Try cool or cold foods like chicken or tuna salad, cottage cheese, boiled eggs with crackers or bread.   You can also include some canned (no added sugar) or frozen fruits with a protein food.   Ideally eat at least a small amount food every 4 hours; start with eating something at least 2 times a day, then increase to 3 times a day, towards eating every 4 hours.     Expected Outcomes:  Other (comment) (depression is limiting motivation at this time)  Education material provided: What to Eat  when you are Sick; offered Carb counting reference but patient states he already has one.   If problems or questions, patient to contact team via:  Phone and Email; will contact patient after next MD visit to check on progress. He currently feels too overwhelmed to schedule follow-up.   Future DSME appointment: - 4-6 wks (to be schededuled later)

## 2016-09-07 NOTE — Patient Instructions (Signed)
   Try some frozen microwaveable meals such as Lean cuisine, Healthy Choice, or others.   Try a nutrition drink such as boost for breakfast or lunch.   Try cool or cold foods like chicken or tuna salad, cottage cheese, boiled eggs with crackers or bread.   You can also include some canned (no added sugar) or frozen fruits with a protein food.   Ideally eat at least a small amount food every 4 hours; start with eating something at least 2 times a day, then increase to 3 times a day, towards eating every 4 hours.

## 2016-09-08 ENCOUNTER — Telehealth: Payer: Self-pay | Admitting: Dietician

## 2016-09-08 DIAGNOSIS — F39 Unspecified mood [affective] disorder: Secondary | ICD-10-CM | POA: Diagnosis not present

## 2016-09-08 NOTE — Telephone Encounter (Signed)
Ms. [name] called to ask about stomach-related diabetes complication which was mentioned during our visit yesterday. Returned her call and discussed gastroparesis and its symptoms. She will discuss with patient and his endocrinologist to determine whether this could be an issue for him.

## 2016-09-11 ENCOUNTER — Encounter (HOSPITAL_COMMUNITY): Payer: Self-pay

## 2016-09-11 DIAGNOSIS — Z87891 Personal history of nicotine dependence: Secondary | ICD-10-CM | POA: Insufficient documentation

## 2016-09-11 DIAGNOSIS — I10 Essential (primary) hypertension: Secondary | ICD-10-CM | POA: Insufficient documentation

## 2016-09-11 DIAGNOSIS — Z7982 Long term (current) use of aspirin: Secondary | ICD-10-CM | POA: Insufficient documentation

## 2016-09-11 DIAGNOSIS — E109 Type 1 diabetes mellitus without complications: Secondary | ICD-10-CM | POA: Diagnosis not present

## 2016-09-11 DIAGNOSIS — F329 Major depressive disorder, single episode, unspecified: Secondary | ICD-10-CM | POA: Diagnosis not present

## 2016-09-11 DIAGNOSIS — Z79899 Other long term (current) drug therapy: Secondary | ICD-10-CM | POA: Insufficient documentation

## 2016-09-11 DIAGNOSIS — R45851 Suicidal ideations: Secondary | ICD-10-CM | POA: Diagnosis not present

## 2016-09-11 LAB — RAPID URINE DRUG SCREEN, HOSP PERFORMED
AMPHETAMINES: NOT DETECTED
BARBITURATES: NOT DETECTED
BENZODIAZEPINES: NOT DETECTED
COCAINE: NOT DETECTED
Opiates: NOT DETECTED
Tetrahydrocannabinol: NOT DETECTED

## 2016-09-11 LAB — COMPREHENSIVE METABOLIC PANEL
ALBUMIN: 3.9 g/dL (ref 3.5–5.0)
ALT: 17 U/L (ref 17–63)
AST: 18 U/L (ref 15–41)
Alkaline Phosphatase: 92 U/L (ref 38–126)
Anion gap: 12 (ref 5–15)
BUN: 24 mg/dL — AB (ref 6–20)
CHLORIDE: 103 mmol/L (ref 101–111)
CO2: 21 mmol/L — AB (ref 22–32)
Calcium: 9.5 mg/dL (ref 8.9–10.3)
Creatinine, Ser: 1.74 mg/dL — ABNORMAL HIGH (ref 0.61–1.24)
GFR calc Af Amer: 50 mL/min — ABNORMAL LOW (ref >60)
GFR calc non Af Amer: 43 mL/min — ABNORMAL LOW (ref >60)
GLUCOSE: 195 mg/dL — AB (ref 65–99)
POTASSIUM: 3.9 mmol/L (ref 3.5–5.1)
SODIUM: 136 mmol/L (ref 135–145)
Total Bilirubin: 0.8 mg/dL (ref 0.3–1.2)
Total Protein: 7 g/dL (ref 6.5–8.1)

## 2016-09-11 LAB — CBC
HEMATOCRIT: 47.6 % (ref 39.0–52.0)
Hemoglobin: 16.5 g/dL (ref 13.0–17.0)
MCH: 30.6 pg (ref 26.0–34.0)
MCHC: 34.7 g/dL (ref 30.0–36.0)
MCV: 88.3 fL (ref 78.0–100.0)
PLATELETS: 315 10*3/uL (ref 150–400)
RBC: 5.39 MIL/uL (ref 4.22–5.81)
RDW: 12.6 % (ref 11.5–15.5)
WBC: 10.2 10*3/uL (ref 4.0–10.5)

## 2016-09-11 LAB — ETHANOL: Alcohol, Ethyl (B): <5 mg/dL (ref <5)

## 2016-09-11 LAB — SALICYLATE LEVEL: Salicylate Lvl: <7 mg/dL (ref 2.8–30.0)

## 2016-09-11 LAB — ACETAMINOPHEN LEVEL: Acetaminophen (Tylenol), Serum: <10 ug/mL — ABNORMAL LOW (ref 10–30)

## 2016-09-11 MED ORDER — LORAZEPAM 1 MG PO TABS
1.0000 mg | ORAL_TABLET | Freq: Once | ORAL | Status: AC
  Administered 2016-09-11: 1 mg via ORAL
  Filled 2016-09-11: qty 1

## 2016-09-11 NOTE — ED Notes (Signed)
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 It was recommended that for better quality of life and to be able to speak, to keep trach.

## 2016-09-11 NOTE — ED Notes (Signed)
Was called by pt's psychiatrist, Dr Nicolasa Ducking.  She stated she feels pt is a flight risk.  Called Staffing to request a sitter;  Actuary unavailable.  Called Agricultural consultant. Will attempt to find pt room.  Presently, pt sitting in area in triage within staff sight.  Pt requested food, given Happy Meal.  Calm, cooperative, mother at side.

## 2016-09-11 NOTE — ED Provider Notes (Signed)
The patient's psychiatrist, Dr.K[facility]), called in to advise that the patient, who has just arrived in the waiting room, is being referred for high risk of suicidal ideation. She feels he does need to be hospitalized. 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 She reports that the patient frequently wants benzodiazepines for sleep but she feels he is not a good candidate for benzodiazepines due to trach and risk of oversedation or overdose.   Charlesetta Shanks, MD 09/11/16 [phone]

## 2016-09-11 NOTE — ED Notes (Signed)
Pt sitting in triage waiting, NAD, RR unlabored, no complaints, talking to family in complete sentences. Update provided to pt.

## 2016-09-11 NOTE — ED Triage Notes (Signed)
Pt presents with c/o depression. He denies plans to end his own life but expressing thoughts of wanting someone else to end his life. Pt appears very anxious. He has a trach and reports trouble breathing and feels as though he cant get enough air in. Sats 100% in triage.

## 2016-09-11 NOTE — ED Notes (Signed)
Pt appears very anxious, walking around room and popping he top of his trach on and off. Dr. Ashok Cordia informed and states he will order patient something for anxious.

## 2016-09-12 ENCOUNTER — Ambulatory Visit: Payer: PPO | Admitting: Licensed Clinical Social Worker

## 2016-09-12 ENCOUNTER — Emergency Department (HOSPITAL_COMMUNITY)
Admission: EM | Admit: 2016-09-12 | Discharge: 2016-09-14 | Disposition: A | Discharge status: Other Acute Inpatient Hospital | Payer: PPO | Attending: Emergency Medicine | Admitting: Emergency Medicine

## 2016-09-12 ENCOUNTER — Encounter (HOSPITAL_COMMUNITY): Payer: Self-pay | Admitting: *Deleted

## 2016-09-12 DIAGNOSIS — R45851 Suicidal ideations: Secondary | ICD-10-CM

## 2016-09-12 HISTORY — DX: Suicide attempt, initial encounter: T14.91XA

## 2016-09-12 HISTORY — DX: Type 2 diabetes mellitus without complications: E11.9

## 2016-09-12 LAB — CBG MONITORING, ED
GLUCOSE-CAPILLARY: 105 mg/dL — AB (ref 65–99)
GLUCOSE-CAPILLARY: 163 mg/dL — AB (ref 65–99)
Glucose-Capillary: 72 mg/dL (ref 65–99)

## 2016-09-12 LAB — VALPROIC ACID LEVEL: Valproic Acid Lvl: 19 ug/mL — ABNORMAL LOW (ref 50.0–100.0)

## 2016-09-12 MED ORDER — ASPIRIN EC 81 MG PO TBEC
81.0000 mg | DELAYED_RELEASE_TABLET | Freq: Every day | ORAL | Status: DC
  Administered 2016-09-12 – 2016-09-14 (×3): 81 mg via ORAL
  Filled 2016-09-12 (×3): qty 1

## 2016-09-12 MED ORDER — DIVALPROEX SODIUM ER 500 MG PO TB24
500.0000 mg | ORAL_TABLET | Freq: Every day | ORAL | Status: DC
  Filled 2016-09-12: qty 1

## 2016-09-12 MED ORDER — SIMVASTATIN 20 MG PO TABS
20.0000 mg | ORAL_TABLET | Freq: Every day | ORAL | Status: DC
  Administered 2016-09-12 – 2016-09-13 (×2): 20 mg via ORAL
  Filled 2016-09-12 (×2): qty 1

## 2016-09-12 MED ORDER — HYDROCHLOROTHIAZIDE 25 MG PO TABS
12.5000 mg | ORAL_TABLET | Freq: Every day | ORAL | Status: DC
  Administered 2016-09-12 – 2016-09-14 (×3): 12.5 mg via ORAL
  Filled 2016-09-12 (×3): qty 1

## 2016-09-12 MED ORDER — AMLODIPINE BESYLATE 5 MG PO TABS
5.0000 mg | ORAL_TABLET | Freq: Every day | ORAL | Status: DC
  Administered 2016-09-12 – 2016-09-14 (×3): 5 mg via ORAL
  Filled 2016-09-12 (×3): qty 1

## 2016-09-12 MED ORDER — LEVOTHYROXINE SODIUM 75 MCG PO TABS
75.0000 ug | ORAL_TABLET | Freq: Every day | ORAL | Status: DC
  Administered 2016-09-12 – 2016-09-14 (×3): 75 ug via ORAL
  Filled 2016-09-12 (×3): qty 1

## 2016-09-12 MED ORDER — PANTOPRAZOLE SODIUM 40 MG PO TBEC
40.0000 mg | DELAYED_RELEASE_TABLET | Freq: Two times a day (BID) | ORAL | Status: DC
  Administered 2016-09-12 – 2016-09-14 (×6): 40 mg via ORAL
  Filled 2016-09-12 (×6): qty 1

## 2016-09-12 MED ORDER — LOSARTAN POTASSIUM 50 MG PO TABS
100.0000 mg | ORAL_TABLET | Freq: Every day | ORAL | Status: DC
  Administered 2016-09-12 – 2016-09-14 (×3): 100 mg via ORAL
  Filled 2016-09-12 (×4): qty 2

## 2016-09-12 MED ORDER — DIVALPROEX SODIUM ER 500 MG PO TB24
500.0000 mg | ORAL_TABLET | Freq: Every day | ORAL | Status: DC
  Administered 2016-09-12: 500 mg via ORAL
  Filled 2016-09-12 (×2): qty 1

## 2016-09-12 MED ORDER — INSULIN ASPART 100 UNIT/ML ~~LOC~~ SOLN
0.0000 [IU] | Freq: Three times a day (TID) | SUBCUTANEOUS | Status: DC
  Administered 2016-09-12: 1.2 [IU] via SUBCUTANEOUS
  Administered 2016-09-14: 3 [IU] via SUBCUTANEOUS
  Filled 2016-09-12 (×3): qty 1

## 2016-09-12 MED ORDER — LORAZEPAM 1 MG PO TABS
1.0000 mg | ORAL_TABLET | Freq: Once | ORAL | Status: AC
  Administered 2016-09-12: 1 mg via ORAL
  Filled 2016-09-12: qty 1

## 2016-09-12 MED ORDER — ESCITALOPRAM OXALATE 10 MG PO TABS
20.0000 mg | ORAL_TABLET | Freq: Every day | ORAL | Status: DC
  Administered 2016-09-12 – 2016-09-14 (×3): 20 mg via ORAL
  Filled 2016-09-12 (×3): qty 2

## 2016-09-12 NOTE — ED Notes (Signed)
Dr Stark Jock aware [facility] requesting for pt to be IVC'd as pt is SI w/plan - in w/pt.

## 2016-09-12 NOTE — ED Notes (Signed)
[place], MD at bedside.. Pt extremely nervous about news of placement. Requested medication to help him relax. MD notified.

## 2016-09-12 NOTE — ED Notes (Signed)
Pt's mother visiting w/pt.

## 2016-09-12 NOTE — Progress Notes (Signed)
Attempted TTS contacted pt. RN, Cart TTS machine still intermittent/ not working [employer]. Nash Shearer, LPC-A, [facility]  Counselor 09/12/2016 4:21 AM

## 2016-09-12 NOTE — ED Notes (Signed)
Mother aware [facility] returned call and is aware pt is in hospital and is cancelling appt he had scheduled today at 2pm.

## 2016-09-12 NOTE — ED Notes (Addendum)
Pt changed his trache as states he does daily. Mother took all of pt's belongings except left pt's labeled black backpack w/his trache supplies and labeled belongings bag w/insulin pump supplies. Sent insulin home w/pt's mother w/her black "[employer]" bag - pt aware.

## 2016-09-12 NOTE — ED Notes (Addendum)
Patient was given a snack and drink, and a Carb Mortified Diet was ordered for Lunch.

## 2016-09-12 NOTE — ED Notes (Signed)
Pt's mother arrived to visit. States she did not take pt's black backpack w/his trache supplies. RN located bag (labeled) on [employer] counter. Placed at nurses' desk. Mother brought pt's insulin pump supplies and pt's insulin (in cooler bag). Placed at nurses' desk until RN is able to speak w/pharmacy.

## 2016-09-12 NOTE — Progress Notes (Addendum)
Pt has been accepted to [facility] per Eustaquio Boyden and able to come anytime. Call to report [phone], accepting Dr. Renard Hamper. Tried to reach the RN to notify of acceptance but no ans and being connected to an answer that does not pick up.   Lind Covert, MSW, [facility] [phone]

## 2016-09-12 NOTE — ED Notes (Signed)
IVC paperwork faxed to [facility].

## 2016-09-12 NOTE — ED Notes (Signed)
Pt now has sitter. Pt in triage waiting. Cooperative and calm at this time.

## 2016-09-12 NOTE — ED Notes (Signed)
Patient having TTS at this time. 

## 2016-09-12 NOTE — BH Assessment (Signed)
Per charge RN Meredith Mody) [facility] unable to accommodate pt due to required laryngectomy care.

## 2016-09-12 NOTE — ED Notes (Signed)
Pt having some difficulty clearing phlem from trach, called Jamie from respiratory to evaluate.

## 2016-09-12 NOTE — Progress Notes (Signed)
Did get TTS machine to work shortly, but pt. Was non-verbal and non-responsive for TTS  [name] [name], LPC-A, [facility]  Counselor 09/12/2016 4:25 AM

## 2016-09-12 NOTE — ED Notes (Signed)
Pt's parents visiting.

## 2016-09-12 NOTE — ED Notes (Signed)
CBG 287. Pt request key to have access to fill up insulin pump.

## 2016-09-12 NOTE — ED Notes (Signed)
Mother advised pt has appt w/Dr [facility] - [phone].

## 2016-09-12 NOTE — ED Notes (Signed)
Per shift report, pt's mother took all of pt's belongings.

## 2016-09-12 NOTE — ED Notes (Addendum)
Per Romie Minus, [facility] - pt accepted to [facility] - reference note for number to call report. Per Cindra Presume, RN - Silvestre Moment [phone] - pt has been accepted but their facility requires for pt to be IVC'd. Advised will hold pt's bed for tomorrow. Pt aware and is in agreement w/tx plan - accepted to [facility] and will be transported in am. Romie Minus, [facility], aware.

## 2016-09-12 NOTE — ED Notes (Addendum)
Pt noted to be talkative and restless - ambulates from room to restroom.

## 2016-09-12 NOTE — ED Notes (Signed)
Patient CBG was 72.

## 2016-09-12 NOTE — ED Notes (Signed)
Magistrate received paperwork and is calling [employer] to notify of need for service. Left message for [facility] Deputy notifying of need for transport in am.

## 2016-09-12 NOTE — Progress Notes (Signed)
Pt meets criteria for inpt treatment. CSW faxed referrals to the following inpt facilities for review:  [facility], [facility], [facility], [place], [facility] [place], [place], [place], [facility], [facility]   TTS will continue to seek bed placement.  Lind Covert, [place] Work [phone]

## 2016-09-12 NOTE — ED Notes (Signed)
Pt ambulatory to restroom then to nurses' desk. States he no longer wants to be given Depakote d/t "it makes me feel bad".

## 2016-09-12 NOTE — BH Assessment (Signed)
Pt chart currently under review by charge RN Meredith Mody) for possible [facility] placement.

## 2016-09-12 NOTE — ED Notes (Signed)
States turned his insulin pump back on at 0820 d/t CBG 165. Pt used glucometer and rechecked now - 225.

## 2016-09-12 NOTE — ED Notes (Signed)
Assisted pt with insulin pump refill

## 2016-09-12 NOTE — ED Notes (Signed)
[name], [facility], advised inpt recommended. Aware pt has insulin pump and will advise if will need to be removed. Per Dr Billy Fischer, pt's psychiatrist called and advised pt is high risk for SI - [name], [facility], aware.

## 2016-09-12 NOTE — BH Assessment (Signed)
Tele Assessment Note   [name] is an 56 y.o. male who came to [facility] emergency department with increased depression and thoughts of suicide with a plan to run into traffic or get hit by a train. He states that he has been getting worse over the past month and just been laying in the bed and sleeping. He states that he is isolating, doesn't come out of his house, is failing to eat properly and has lost over 30lbs. He states that he is a type 1 diabetic and his blood sugar was 50 when he came in due to not eating. Pt has a history of suicide attempt by overdose in 2014. 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 He states that he heard voices yesterday of people "talking in his house" but is not hearing that today. He has been non compliant with his medications and has not been taking his depakote as prescribed recently. Pt currently sees Elmyra Ricks a therapist at [facility] outpatient and Dr. Renda Rolls for his medications. Pt denies history of abuse, no family history of suicide and no substance abuse issues. Pt denies thoughts to harm others.   Disposition: inpatient recommended per Priscille Loveless NP   Diagnosis: Major Depressive Disorder Recurrent Severe,   Past Medical History:  Past Medical History:  Diagnosis Date  . Depression   . Diabetes mellitus without complication ([place])   . GERD (gastroesophageal reflux disease)   . H/O laryngectomy   . Hyperlipidemia   . Hypertension   . Thyroid disease     History reviewed. No pertinent surgical history.  Family History: No family history on file.  Social History:  reports that he quit smoking about 3 years ago. His smoking use included Cigarettes. He smoked 0.00 packs per day. He has never used smokeless tobacco. He reports that he does not drink alcohol or use drugs.  Additional Social History:  Alcohol / Drug Use Pain Medications: See MAR Prescriptions:  See MAR Over the Counter: See MAR History of alcohol / drug use?: No history of alcohol / drug abuse  CIWA: CIWA-Ar BP: 122/63 Pulse Rate: 75 COWS:    PATIENT STRENGTHS: (choose at least two) Average or above average intelligence General fund of knowledge  Allergies:  Allergies  Allergen Reactions  . Buspar [Buspirone]     "freaked out"  . Clopidogrel Rash  . Gabapentin Itching and Rash    Home Medications:  (Not in a hospital admission)  OB/GYN Status:  No LMP for male patient.  General Assessment Data Location of Assessment: [facility] ED TTS Assessment: In system Is this a Tele or Face-to-Face Assessment?: Tele Assessment Is this an Initial Assessment or a Re-assessment for this encounter?: Initial Assessment Marital status: Single Living Arrangements: Alone Can pt return to current living arrangement?: Yes Admission Status: Voluntary Is patient capable of signing voluntary admission?: Yes Referral Source: Self/Family/Friend Insurance type: [employer]     Crisis Care Plan Living Arrangements: Alone Legal Guardian: Other: (self) Name of Psychiatrist: Dr. Renda Rolls Name of Therapist: Elmyra Ricks at [facility]  Education Status Is patient currently in school?: No Highest grade of school patient has completed: College  Risk to self with the past 6 months Suicidal Ideation: Yes-Currently Present Has patient been a risk to self within the past 6 months prior to admission? : Yes Suicidal Intent: Yes-Currently Present Has patient had any suicidal intent within the  past 6 months prior to admission? : Yes Is patient at risk for suicide?: Yes Suicidal Plan?: Yes-Currently Present Has patient had any suicidal plan within the past 6 months prior to admission? : Yes Specify Current Suicidal Plan: jump in front of a train Access to Means: Yes Specify Access to Suicidal Means: access to a train What has been your use of drugs/alcohol within the last 12 months?: pt  denies Previous Attempts/Gestures: Yes How many times?: 1 Other Self Harm Risks: NA Triggers for Past Attempts: Unknown Intentional Self Injurious Behavior: None Family Suicide History: No Recent stressful life event(s): Recent negative physical changes Persecutory voices/beliefs?: No Depression: Yes Depression Symptoms: Despondent, Tearfulness, Fatigue, Loss of interest in usual pleasures, Feeling worthless/self pity Substance abuse history and/or treatment for substance abuse?: No Suicide prevention information given to non-admitted patients: Not applicable  Risk to Others within the past 6 months Homicidal Ideation: No Does patient have any lifetime risk of violence toward others beyond the six months prior to admission? : No Thoughts of Harm to Others: No Current Homicidal Intent: No Current Homicidal Plan: No Access to Homicidal Means: No Identified Victim: none History of harm to others?: No Assessment of Violence: None Noted Violent Behavior Description: none Does patient have access to weapons?: No Criminal Charges Pending?: No Does patient have a court date: No Is patient on probation?: No  Psychosis Hallucinations: None noted Delusions: None noted  Mental Status Report Appearance/Hygiene: In scrubs Eye Contact: Fair Motor Activity: Freedom of movement Speech: Unremarkable Level of Consciousness: Alert Mood: Depressed Affect: Appropriate to circumstance Anxiety Level: Moderate Thought Processes: Coherent Judgement: Impaired Orientation: Person, Place, Time, Situation, Appropriate for developmental age Obsessive Compulsive Thoughts/Behaviors: None  Cognitive Functioning Concentration: Fair Memory: Recent Intact, Remote Intact IQ: Average Insight: Fair Impulse Control: Fair Appetite: Poor Weight Loss: 30 Weight Gain: 0 Sleep: Increased Total Hours of Sleep: 10 Vegetative Symptoms: Staying in bed  ADLScreening [facility] Assessment Services) Patient's  cognitive ability adequate to safely complete daily activities?: Yes Patient able to express need for assistance with ADLs?: Yes Independently performs ADLs?: Yes (appropriate for developmental age)  Prior Inpatient Therapy Prior Inpatient Therapy: Yes Prior Therapy Dates: December 2014 Prior Therapy Facilty/Provider(s): [facility]  Reason for Treatment: Depression SI   Prior Outpatient Therapy Prior Outpatient Therapy: Yes Prior Therapy Dates: ongoing Prior Therapy Facilty/Provider(s): [facility]  Reason for Treatment: Depression  Does patient have an ACCT team?: No Does patient have Intensive In-House Services?  : No Does patient have [facility] services? : No Does patient have P4CC services?: No  ADL Screening (condition at time of admission) Patient's cognitive ability adequate to safely complete daily activities?: Yes Is the patient deaf or have difficulty hearing?: No Does the patient have difficulty seeing, even when wearing glasses/contacts?: No Does the patient have difficulty concentrating, remembering, or making decisions?: No Patient able to express need for assistance with ADLs?: Yes Does the patient have difficulty dressing or bathing?: No Independently performs ADLs?: Yes (appropriate for developmental age) Does the patient have difficulty walking or climbing stairs?: No Weakness of Legs: None Weakness of Arms/Hands: None  Home Assistive Devices/Equipment Home Assistive Devices/Equipment: None  Therapy Consults (therapy consults require a physician order) PT Evaluation Needed: No OT Evalulation Needed: No SLP Evaluation Needed: No Abuse/Neglect Assessment (Assessment to be complete while patient is alone) Physical Abuse: Denies Verbal Abuse: Denies Sexual Abuse: Denies Exploitation of patient/patient's resources: Denies Self-Neglect: Denies Values / Beliefs Cultural Requests During Hospitalization: None Spiritual Requests During Hospitalization:  None Consults Spiritual Care Consult Needed: No Social Work Consult Needed: No Regulatory affairs officer (For Healthcare) Does Patient Have a Medical Advance Directive?: No Would patient like information on creating a medical advance directive?: No - Patient declined Nutrition Screen- [facility] Adult/WL/AP Patient's home diet: Regular Has the patient recently lost weight without trying?: No Has the patient been eating poorly because of a decreased appetite?: No Malnutrition Screening Tool Score: 0  Additional Information 1:1 In Past 12 Months?: No CIRT Risk: No Elopement Risk: No Does patient have medical clearance?: Yes     Disposition:  Disposition Initial Assessment Completed for this Encounter: Yes Disposition of Patient: Inpatient treatment program Type of inpatient treatment program: Adult  Undine Nealis 09/12/2016 9:29 AM

## 2016-09-12 NOTE — Progress Notes (Signed)
CSW received call from [place], who notes that pt needs to be IVC'd prior to transfer.  Areatha Keas. [name], MSW, [place] Work Disposition [phone]

## 2016-09-12 NOTE — ED Provider Notes (Signed)
[facility] DEPT Provider Note   CSN: [id number] Arrival date & time: 09/11/16  1753 By signing my name below, I, Dyke Brackett, attest that this documentation has been prepared under the direction and in the presence of Annalina Needles, Annie Main, MD . Electronically Signed: Dyke Brackett, Scribe. 09/12/2016. 2:05 AM.   History   Chief Complaint Chief Complaint  Patient presents with  . Depression  . Suicidal   HPI [name] is a 56 y.o. male with a history of depression, DM type 1, HTN and laryngectomy who presents to the Emergency Department complaining of progressively worsening depression onset four weeks ago. He reports associated anxiety, SOB, auditory hallucinations, suicidal ideation, decreased appetite. He has no specific plan for suicide but states "I have all these wild thoughts". Pt was seen for the same on 08/27/16 and was discharged home the next day. Pt's mother states "I went to check on him today and he was all to pieces". He has been compliant with his medications, but he does not feel that they are effective.Pt lives alone and does not have guns in the home. No illicit drug or alcohol use. No HI, CP, or abdominal pain.   The history is provided by the patient and a parent. No language interpreter was used.   Past Medical History:  Diagnosis Date  . Depression   . Diabetes mellitus without complication ([place])   . GERD (gastroesophageal reflux disease)   . H/O laryngectomy   . Hyperlipidemia   . Hypertension   . Thyroid disease    There are no active problems to display for this patient.  History reviewed. No pertinent surgical history.  Home Medications    Prior to Admission medications   Medication Sig Start Date End Date Taking? Authorizing Provider  amLODipine (NORVASC) 10 MG tablet Take 5 mg by mouth daily.    [provider]  amLODipine (NORVASC) 5 MG tablet Take 5 mg by mouth daily.    [provider]  aspirin EC 81 MG tablet Take 1  tablet by mouth daily.    [provider]  Diphenhydramine-APAP, sleep, (TYLENOL PM EXTRA STRENGTH PO) Take 1 tablet by mouth at bedtime as needed.    [provider]  divalproex (DEPAKOTE ER) 500 MG 24 hr tablet Take 500 mg by mouth daily.    [provider]  doxycycline (VIBRA-TABS) 100 MG tablet Take 100 mg by mouth 2 (two) times daily. Started on 08-21-16 for 10 days    [provider]  escitalopram (LEXAPRO) 10 MG tablet Take 20 mg by mouth daily.    [provider]  Fexofenadine HCl (ALLEGRA PO) Take 180 mg by mouth daily.     [provider]  fluticasone (FLONASE) 50 MCG/ACT nasal spray Place 2 sprays into both nostrils daily.    [provider]  hydrochlorothiazide (HYDRODIURIL) 12.5 MG tablet Take 12.5 mg by mouth daily. 02/15/15   [provider]  insulin aspart (NOVOLOG) 100 UNIT/ML injection Inject 1.15 Units into the skin daily. Via insulin pump-basal rate 1.15-1.25 units/hr 06/26/16   [provider]  levothyroxine (SYNTHROID, LEVOTHROID) 75 MCG tablet Take 75 mcg by mouth daily before breakfast.    [provider]  losartan (COZAAR) 100 MG tablet Take 1 tablet by mouth daily. 04/26/16   [provider]  pantoprazole (PROTONIX) 40 MG tablet Take 1 tablet by mouth 2 (two) times daily. 05/09/16   [provider]  simvastatin (ZOCOR) 20 MG tablet Take 1 tablet by mouth  daily.    [provider]    Family History No family history on file.  Social History Social History  Substance Use Topics  . Smoking status: Former Smoker    Packs/day: 0.00    Types: Cigarettes    Quit date: 04/09/2013  . Smokeless tobacco: Never Used  . Alcohol use No     Comment: Pt denied; BAC not available    Allergies   Buspar [buspirone]; Clopidogrel; and Gabapentin  Review of Systems Review of Systems All systems reviewed and are negative for acute change except as noted in the  HPI.  Physical Exam Updated Vital Signs BP 124/81 (BP Location: Right Arm)   Pulse 88   Temp 98.7 F (37.1 C) (Oral)   Resp (!) 22   SpO2 100%   Physical Exam  Constitutional: He is oriented to person, place, and time. He appears well-developed and well-nourished. No distress.  Appears anxious.  HENT:  Head: Normocephalic and atraumatic.  Mouth/Throat: Oropharynx is clear and moist. No oropharyngeal exudate.  Tracheostomy in place.  Eyes: Conjunctivae and EOM are normal. Pupils are equal, round, and reactive to light.  Neck: Normal range of motion. Neck supple.  No meningismus.  Cardiovascular: Normal rate, regular rhythm, normal heart sounds and intact distal pulses.   No murmur heard. Pulmonary/Chest: Effort normal and breath sounds normal. No respiratory distress.  Abdominal: Soft. There is no tenderness. There is no rebound and no guarding.  Musculoskeletal: Normal range of motion. He exhibits no edema or tenderness.  Neurological: He is alert and oriented to person, place, and time. No cranial nerve deficit. He exhibits normal muscle tone. Coordination normal.   5/5 strength throughout. CN 2-12 intact.Equal grip strength.   Skin: Skin is warm.  Psychiatric: His behavior is normal. His mood appears anxious.  Nursing note and vitals reviewed.  ED Treatments / Results  DIAGNOSTIC STUDIES:  Oxygen Saturation is 100% on RA, normal by my interpretation.    COORDINATION OF CARE:  2:05 AM Discussed treatment plan with pt at bedside and pt agreed to plan.   Labs (all labs ordered are listed, but only abnormal results are displayed) Labs Reviewed  COMPREHENSIVE METABOLIC PANEL - Abnormal; Notable for the following:       Result Value   CO2 21 (*)    Glucose, Bld 195 (*)    BUN 24 (*)    Creatinine, Ser 1.74 (*)    GFR calc non Af Amer 43 (*)    GFR calc Af Amer 50 (*)    All other components within normal limits  ACETAMINOPHEN LEVEL - Abnormal; Notable for the  following:    Acetaminophen (Tylenol), Serum <10 (*)    All other components within normal limits  VALPROIC ACID LEVEL - Abnormal; Notable for the following:    Valproic Acid Lvl 19 (*)    All other components within normal limits  ETHANOL  SALICYLATE LEVEL  CBC  RAPID URINE DRUG SCREEN, HOSP PERFORMED    EKG  EKG Interpretation None       Radiology No results found.  Procedures Procedures (including critical care time)  Medications Ordered in ED Medications  LORazepam (ATIVAN) tablet 1 mg (1 mg Oral Given 09/11/16 1957)     Initial Impression / Assessment and Plan / ED Course  I have reviewed the triage vital signs and the nursing notes.  Pertinent labs & imaging results that were available during my care of the patient were reviewed by me and considered  in my medical decision making (see chart for details).     Patient presents with suicidal thoughts without specific plan. He is a type I diabetic with laryngectomy. He denies any hallucinations or homicidal thoughts.  Vitals are reassuring. he is calm and cooperative. Labs show hyperglycemia without evidence of DKA. Creatinine at baseline.   Patient is medically clear for psychiatric evaluation. Holding orders placed. CBG checks ordered.   Final Clinical Impressions(s) / ED Diagnoses   Final diagnoses:  Suicidal ideation    New Prescriptions New Prescriptions   No medications on file   I personally performed the services described in this documentation, which was scribed in my presence. The recorded information has been reviewed and is accurate.    Ezequiel Essex, MD 09/12/16 [phone]

## 2016-09-12 NOTE — ED Notes (Signed)
Pt called his mother and advised of tx plan - to be transported to [facility].

## 2016-09-12 NOTE — ED Notes (Signed)
Pt signed consent to release information to his mother - Alvar Malinoski - [phone] - copy faxed to [facility], copy sent to medical records, and copy placed on chart.

## 2016-09-12 NOTE — Progress Notes (Signed)
CSW received a voicemail from [facility] requesting the pt's DOB. Called back and provided information.  Lind Covert, MSW, [place] Disposition [phone]

## 2016-09-12 NOTE — ED Notes (Signed)
Pt requests trach supplies to change to PM equipment. Cabinet unlocked to allow pt to access and change supplies.

## 2016-09-12 NOTE — ED Notes (Signed)
Patient was given a snack and drink, and A Carb Mortified Ordered for [employer]

## 2016-09-13 ENCOUNTER — Inpatient Hospital Stay: Admission: AD | Admit: 2016-09-13 | Payer: PPO | Source: Intra-hospital | Admitting: Psychiatry

## 2016-09-13 DIAGNOSIS — E039 Hypothyroidism, unspecified: Secondary | ICD-10-CM | POA: Diagnosis present

## 2016-09-13 DIAGNOSIS — K219 Gastro-esophageal reflux disease without esophagitis: Secondary | ICD-10-CM | POA: Diagnosis present

## 2016-09-13 DIAGNOSIS — F332 Major depressive disorder, recurrent severe without psychotic features: Secondary | ICD-10-CM | POA: Diagnosis present

## 2016-09-13 DIAGNOSIS — Z930 Tracheostomy status: Secondary | ICD-10-CM | POA: Diagnosis not present

## 2016-09-13 DIAGNOSIS — I10 Essential (primary) hypertension: Secondary | ICD-10-CM | POA: Diagnosis present

## 2016-09-13 DIAGNOSIS — E785 Hyperlipidemia, unspecified: Secondary | ICD-10-CM | POA: Diagnosis present

## 2016-09-13 LAB — CBG MONITORING, ED
GLUCOSE-CAPILLARY: 233 mg/dL — AB (ref 65–99)
Glucose-Capillary: 51 mg/dL — ABNORMAL LOW (ref 65–99)
Glucose-Capillary: 162 mg/dL — ABNORMAL HIGH (ref 65–99)
Glucose-Capillary: 127 mg/dL — ABNORMAL HIGH (ref 65–99)

## 2016-09-13 MED ORDER — ASPIRIN EC 81 MG PO TBEC: 81.0000 mg | DELAYED_RELEASE_TABLET | Freq: Every day | ORAL | Status: DC

## 2016-09-13 MED ORDER — INSULIN GLARGINE 100 UNIT/ML ~~LOC~~ SOLN
30.0000 [IU] | Freq: Every day | SUBCUTANEOUS | Status: DC
  Administered 2016-09-13: 30 [IU] via SUBCUTANEOUS
  Filled 2016-09-13 (×2): qty 0.3

## 2016-09-13 MED ORDER — ALUM & MAG HYDROXIDE-SIMETH 200-200-20 MG/5ML PO SUSP: 30.0000 mL | ORAL | Status: DC | PRN

## 2016-09-13 MED ORDER — ZOLPIDEM TARTRATE 5 MG PO TABS: 5.0000 mg | ORAL_TABLET | Freq: Every evening | ORAL | Status: DC | PRN

## 2016-09-13 MED ORDER — AMLODIPINE BESYLATE 5 MG PO TABS: 5.0000 mg | ORAL_TABLET | Freq: Every day | ORAL | Status: DC

## 2016-09-13 MED ORDER — FLUTICASONE PROPIONATE 50 MCG/ACT NA SUSP
2.0000 | Freq: Every day | NASAL | Status: DC
  Filled 2016-09-13: qty 16

## 2016-09-13 MED ORDER — LEVOTHYROXINE SODIUM 50 MCG PO TABS: 75.0000 ug | ORAL_TABLET | Freq: Every day | ORAL | Status: DC

## 2016-09-13 MED ORDER — LOSARTAN POTASSIUM 50 MG PO TABS: 100.0000 mg | ORAL_TABLET | Freq: Every day | ORAL | Status: DC

## 2016-09-13 MED ORDER — ZOLPIDEM TARTRATE 5 MG PO TABS
5.0000 mg | ORAL_TABLET | Freq: Every evening | ORAL | Status: DC | PRN
Start: 2016-09-13 — End: 2016-09-14
  Administered 2016-09-13 (×2): 5 mg via ORAL
  Filled 2016-09-13 (×2): qty 1

## 2016-09-13 MED ORDER — HYDROCHLOROTHIAZIDE 12.5 MG PO CAPS: 12.5000 mg | ORAL_CAPSULE | Freq: Every day | ORAL | Status: DC

## 2016-09-13 MED ORDER — ESCITALOPRAM OXALATE 10 MG PO TABS: 20.0000 mg | ORAL_TABLET | Freq: Every day | ORAL | Status: DC

## 2016-09-13 MED ORDER — ACETAMINOPHEN 325 MG PO TABS
650.0000 mg | ORAL_TABLET | Freq: Four times a day (QID) | ORAL | Status: DC | PRN
  Filled 2016-09-13 (×2): qty 2

## 2016-09-13 MED ORDER — SIMVASTATIN 40 MG PO TABS: 20.0000 mg | ORAL_TABLET | Freq: Every day | ORAL | Status: DC

## 2016-09-13 MED ORDER — INSULIN ASPART 100 UNIT/ML ~~LOC~~ SOLN
7.0000 [IU] | Freq: Three times a day (TID) | SUBCUTANEOUS | Status: DC
  Administered 2016-09-14: 7 [IU] via SUBCUTANEOUS
  Filled 2016-09-13: qty 1

## 2016-09-13 MED ORDER — LORATADINE 10 MG PO TABS: 10.0000 mg | ORAL_TABLET | Freq: Every day | ORAL | Status: DC

## 2016-09-13 MED ORDER — PANTOPRAZOLE SODIUM 40 MG PO TBEC: 40.0000 mg | DELAYED_RELEASE_TABLET | Freq: Every day | ORAL | Status: DC

## 2016-09-13 MED ORDER — MAGNESIUM HYDROXIDE 400 MG/5ML PO SUSP: 30.0000 mL | Freq: Every day | ORAL | Status: DC | PRN

## 2016-09-13 NOTE — ED Notes (Signed)
waiting for shower.

## 2016-09-13 NOTE — ED Notes (Signed)
Pt was escorted to shower with sitter.

## 2016-09-13 NOTE — ED Notes (Signed)
Pt was ready to transport to [facility] and I was told pt could not be taken bc it was after 5pm. Pt will need to have all forms redone in the am and transportation called. Notified [facility] that pt will not be coming tonight.

## 2016-09-13 NOTE — BH Assessment (Addendum)
Patient is to be admitted to [facility] by Dr. Bary Leriche.  Attending Physician will be Dr. Bary Leriche.   Patient has been assigned to room 310A, by [facility].   Pt should arrive prior to 1900. Romie Minus, LCSW informed of pt acceptance.  Pt IVC papers requested to be faxed [phone].  Call report to [phone].

## 2016-09-13 NOTE — Progress Notes (Signed)
Pt accepted to [facility], Bed 310, Dr. Bary Leriche accepting Please transfer to arrive before 7 PM.  IVC paperwork to be faxed to [phone] prior to patient transport.  [facility] notified by this Probation officer.  Areatha Keas. [name], MSW, [place] Work Disposition [phone]

## 2016-09-13 NOTE — ED Notes (Signed)
Administered Lantus and advised pt that insulin pump will come off at 7:45

## 2016-09-13 NOTE — ED Notes (Signed)
Pt given access to his backpack so he could change out a pc for his trach. Then his backpack was locked back up

## 2016-09-13 NOTE — Progress Notes (Signed)
Dr. Sammuel Hines office has faxed recommendations for SQ insulin.  Spoke to ED RN regarding recommendations.  She will send printed version to [facility] for ordering MD at [facility].   Per RN, MD recommends Lantus 30 units q HS and Novolog 7 units tid with meals along with a correction scale.  Note patient needs Lantus prior to d/c of insulin pump while in ED (d/c insulin pump 1 hour after lantus given).  Since patient will get Lantus this PM prior to transfer, he will not need another dose tonight at bedtime but can resume 5/10 at HS. Discussed with RN.  She will discuss with ED physician.  Also recommend hospitalist consult at [facility] to help with management of diabetes.  Of note patient does have continuous glucose monitor as well as insulin pump.  He cannot dose insulin from this and therefore it should be okay for him to wear however CBG's will still need to be checked per orders.  Thanks, Adah Perl, RN, BC-ADM Inpatient Diabetes Coordinator Pager [phone] (8a-5p)

## 2016-09-13 NOTE — ED Notes (Signed)
Pt given a snack of lemon cookies and diet coke per requested

## 2016-09-13 NOTE — ED Notes (Signed)
Pt. Standing in door way asking to take shower. Pt. Informed that we were waiting on a few other people to shower.

## 2016-09-13 NOTE — ED Notes (Addendum)
[facility] notified that they are no longer accepting the patient due to trach and insulin pump. [facility] counselor , pt. And family made aware at this time.

## 2016-09-13 NOTE — Progress Notes (Addendum)
Call received from [facility] regarding patient's upcoming admit.  He has insulin pump and has had Type 1 diabetes since age 56.  He see's Dr. Paul-endocrinologist for diabetes. Called her office to obtain recommendations for SQ insulin regimen off insulin pump.  Due to suicidal precautions, patient should not be on insulin pump per policy.  Will need basal/bolus insulin regimen.  Patient needs to receive basal insulin prior to d/c off insulin pump.  Discussed with RN.  Nurse from office states that they are going to document recommendations for insulin in chart for MD to see.   Thanks, Adah Perl, RN, BC-ADM Inpatient Diabetes Coordinator Pager [phone]    4:50 PM- Recommendations from Dr. Sammuel Hines office being faxed to [phone] regarding insulin.

## 2016-09-13 NOTE — ED Notes (Signed)
Spoke with Elige Radon from [employer] about pt. And what he will need for medical purposes.

## 2016-09-13 NOTE — ED Notes (Signed)
Pt up c/o being unable to sleep because of all the noise. MD notified of pt request for medication.

## 2016-09-13 NOTE — ED Notes (Signed)
Pt. CBG 51. Pt. Up in bed eating breakfast tray at this time. Will reevaluate.

## 2016-09-13 NOTE — ED Notes (Signed)
Patient up to use restroom 

## 2016-09-13 NOTE — ED Notes (Signed)
Family has been with pt. Pt eating at this time.

## 2016-09-13 NOTE — ED Notes (Signed)
Given snacks.

## 2016-09-13 NOTE — ED Notes (Signed)
Pt requested a cup of ice water and pt given the same

## 2016-09-13 NOTE — ED Notes (Signed)
Diabetic coordinator Sonia Baller was speaking with pt.

## 2016-09-13 NOTE — ED Notes (Addendum)
Followed up with sheriff department about transportation to [facility]. They state they will be here shortly and will call prior to arrival.   Called report to Psychologist, prison and probation services at [facility].

## 2016-09-13 NOTE — BH Assessment (Signed)
Per charge RN [facility]) pt has been reviewed by Dr.Pucilowska and is declined for placement at [facility]. Otila Kluver, [facility] informed.

## 2016-09-14 ENCOUNTER — Inpatient Hospital Stay
Admission: RE | Admit: 2016-09-14 | Discharge: 2016-09-19 | DRG: 885 | Disposition: A | Discharge status: Home / Self Care | Payer: PPO | Attending: Psychiatry | Admitting: Psychiatry

## 2016-09-14 DIAGNOSIS — I10 Essential (primary) hypertension: Secondary | ICD-10-CM | POA: Diagnosis present

## 2016-09-14 DIAGNOSIS — F332 Major depressive disorder, recurrent severe without psychotic features: Principal | ICD-10-CM | POA: Diagnosis present

## 2016-09-14 DIAGNOSIS — Z7982 Long term (current) use of aspirin: Secondary | ICD-10-CM | POA: Diagnosis not present

## 2016-09-14 DIAGNOSIS — Z8659 Personal history of other mental and behavioral disorders: Secondary | ICD-10-CM | POA: Diagnosis not present

## 2016-09-14 DIAGNOSIS — F410 Panic disorder [episodic paroxysmal anxiety] without agoraphobia: Secondary | ICD-10-CM | POA: Diagnosis not present

## 2016-09-14 DIAGNOSIS — Z87891 Personal history of nicotine dependence: Secondary | ICD-10-CM

## 2016-09-14 DIAGNOSIS — E109 Type 1 diabetes mellitus without complications: Secondary | ICD-10-CM | POA: Diagnosis not present

## 2016-09-14 DIAGNOSIS — F429 Obsessive-compulsive disorder, unspecified: Secondary | ICD-10-CM | POA: Diagnosis present

## 2016-09-14 DIAGNOSIS — Z794 Long term (current) use of insulin: Secondary | ICD-10-CM | POA: Diagnosis not present

## 2016-09-14 DIAGNOSIS — K219 Gastro-esophageal reflux disease without esophagitis: Secondary | ICD-10-CM | POA: Diagnosis present

## 2016-09-14 DIAGNOSIS — E1065 Type 1 diabetes mellitus with hyperglycemia: Secondary | ICD-10-CM | POA: Diagnosis present

## 2016-09-14 DIAGNOSIS — E785 Hyperlipidemia, unspecified: Secondary | ICD-10-CM | POA: Diagnosis present

## 2016-09-14 DIAGNOSIS — Z915 Personal history of self-harm: Secondary | ICD-10-CM

## 2016-09-14 DIAGNOSIS — Z888 Allergy status to other drugs, medicaments and biological substances status: Secondary | ICD-10-CM | POA: Diagnosis not present

## 2016-09-14 DIAGNOSIS — Z9641 Presence of insulin pump (external) (internal): Secondary | ICD-10-CM | POA: Diagnosis present

## 2016-09-14 DIAGNOSIS — F4310 Post-traumatic stress disorder, unspecified: Secondary | ICD-10-CM | POA: Diagnosis present

## 2016-09-14 DIAGNOSIS — G4700 Insomnia, unspecified: Secondary | ICD-10-CM | POA: Diagnosis not present

## 2016-09-14 DIAGNOSIS — R45851 Suicidal ideations: Secondary | ICD-10-CM | POA: Diagnosis present

## 2016-09-14 DIAGNOSIS — F329 Major depressive disorder, single episode, unspecified: Secondary | ICD-10-CM | POA: Diagnosis not present

## 2016-09-14 DIAGNOSIS — E039 Hypothyroidism, unspecified: Secondary | ICD-10-CM | POA: Diagnosis present

## 2016-09-14 DIAGNOSIS — Z5181 Encounter for therapeutic drug level monitoring: Secondary | ICD-10-CM | POA: Diagnosis not present

## 2016-09-14 DIAGNOSIS — Z79899 Other long term (current) drug therapy: Secondary | ICD-10-CM | POA: Diagnosis not present

## 2016-09-14 DIAGNOSIS — E119 Type 2 diabetes mellitus without complications: Secondary | ICD-10-CM | POA: Diagnosis not present

## 2016-09-14 DIAGNOSIS — N400 Enlarged prostate without lower urinary tract symptoms: Secondary | ICD-10-CM | POA: Diagnosis not present

## 2016-09-14 LAB — CBG MONITORING, ED
GLUCOSE-CAPILLARY: 353 mg/dL — AB (ref 65–99)
GLUCOSE-CAPILLARY: 416 mg/dL — AB (ref 65–99)
GLUCOSE-CAPILLARY: 442 mg/dL — AB (ref 65–99)

## 2016-09-14 LAB — GLUCOSE, CAPILLARY
GLUCOSE-CAPILLARY: 242 mg/dL — AB (ref 65–99)
GLUCOSE-CAPILLARY: 304 mg/dL — AB (ref 65–99)
GLUCOSE-CAPILLARY: 288 mg/dL — AB (ref 65–99)
Glucose-Capillary: 238 mg/dL — ABNORMAL HIGH (ref 65–99)

## 2016-09-14 MED ORDER — ALUM & MAG HYDROXIDE-SIMETH 200-200-20 MG/5ML PO SUSP: 30.0000 mL | ORAL | Status: DC | PRN

## 2016-09-14 MED ORDER — INSULIN GLARGINE 100 UNIT/ML ~~LOC~~ SOLN
30.0000 [IU] | Freq: Every day | SUBCUTANEOUS | Status: DC
  Administered 2016-09-14 – 2016-09-15 (×2): 30 [IU] via SUBCUTANEOUS
  Filled 2016-09-14 (×3): qty 0.3

## 2016-09-14 MED ORDER — LORATADINE 10 MG PO TABS
10.0000 mg | ORAL_TABLET | Freq: Every day | ORAL | Status: DC
  Administered 2016-09-14 – 2016-09-19 (×6): 10 mg via ORAL
  Filled 2016-09-14 (×6): qty 1

## 2016-09-14 MED ORDER — INSULIN ASPART 100 UNIT/ML ~~LOC~~ SOLN
8.0000 [IU] | Freq: Once | SUBCUTANEOUS | Status: AC
  Administered 2016-09-14: 8 [IU] via SUBCUTANEOUS
  Filled 2016-09-14: qty 1

## 2016-09-14 MED ORDER — INSULIN ASPART 100 UNIT/ML ~~LOC~~ SOLN
6.0000 [IU] | Freq: Once | SUBCUTANEOUS | Status: AC
  Administered 2016-09-14: 6 [IU] via SUBCUTANEOUS

## 2016-09-14 MED ORDER — FLUTICASONE PROPIONATE 50 MCG/ACT NA SUSP
2.0000 | Freq: Every day | NASAL | Status: DC
  Administered 2016-09-14 – 2016-09-19 (×6): 2 via NASAL
  Filled 2016-09-14: qty 16

## 2016-09-14 MED ORDER — INSULIN ASPART 100 UNIT/ML ~~LOC~~ SOLN
0.0000 [IU] | Freq: Every day | SUBCUTANEOUS | Status: DC
  Administered 2016-09-14: 2 [IU] via SUBCUTANEOUS

## 2016-09-14 MED ORDER — ACETAMINOPHEN 325 MG PO TABS
650.0000 mg | ORAL_TABLET | Freq: Four times a day (QID) | ORAL | Status: DC | PRN
  Administered 2016-09-14 – 2016-09-17 (×3): 650 mg via ORAL
  Filled 2016-09-14 (×2): qty 2

## 2016-09-14 MED ORDER — AMLODIPINE BESYLATE 5 MG PO TABS
5.0000 mg | ORAL_TABLET | Freq: Every day | ORAL | Status: DC
  Administered 2016-09-15 – 2016-09-19 (×5): 5 mg via ORAL
  Filled 2016-09-14 (×5): qty 1

## 2016-09-14 MED ORDER — SIMVASTATIN 40 MG PO TABS
20.0000 mg | ORAL_TABLET | Freq: Every day | ORAL | Status: DC
  Administered 2016-09-14 – 2016-09-19 (×6): 20 mg via ORAL
  Filled 2016-09-14: qty 1
  Filled 2016-09-14: qty 2
  Filled 2016-09-14 (×3): qty 1
  Filled 2016-09-14: qty 2

## 2016-09-14 MED ORDER — ASPIRIN EC 81 MG PO TBEC
81.0000 mg | DELAYED_RELEASE_TABLET | Freq: Every day | ORAL | Status: DC
  Administered 2016-09-15 – 2016-09-19 (×5): 81 mg via ORAL
  Filled 2016-09-14 (×5): qty 1

## 2016-09-14 MED ORDER — QUETIAPINE FUMARATE 25 MG PO TABS
50.0000 mg | ORAL_TABLET | Freq: Every day | ORAL | Status: DC
  Administered 2016-09-14: 50 mg via ORAL
  Filled 2016-09-14: qty 2

## 2016-09-14 MED ORDER — INSULIN ASPART 100 UNIT/ML ~~LOC~~ SOLN
4.0000 [IU] | Freq: Three times a day (TID) | SUBCUTANEOUS | Status: DC
  Administered 2016-09-14: 4 [IU] via SUBCUTANEOUS

## 2016-09-14 MED ORDER — INSULIN ASPART 100 UNIT/ML ~~LOC~~ SOLN: 1.0000 [IU] | SUBCUTANEOUS | Status: DC

## 2016-09-14 MED ORDER — ESCITALOPRAM OXALATE 10 MG PO TABS
20.0000 mg | ORAL_TABLET | Freq: Every day | ORAL | Status: DC
  Administered 2016-09-15 – 2016-09-18 (×4): 20 mg via ORAL
  Filled 2016-09-14 (×4): qty 2

## 2016-09-14 MED ORDER — DIVALPROEX SODIUM ER 500 MG PO TB24: 500.0000 mg | ORAL_TABLET | Freq: Every day | ORAL | Status: DC

## 2016-09-14 MED ORDER — LEVOTHYROXINE SODIUM 75 MCG PO TABS
75.0000 ug | ORAL_TABLET | Freq: Every day | ORAL | Status: DC
Start: 2016-09-15 — End: 2016-09-19
  Administered 2016-09-15 – 2016-09-19 (×5): 75 ug via ORAL
  Filled 2016-09-14 (×5): qty 1

## 2016-09-14 MED ORDER — INSULIN ASPART 100 UNIT/ML ~~LOC~~ SOLN
0.0000 [IU] | Freq: Three times a day (TID) | SUBCUTANEOUS | Status: DC
  Administered 2016-09-14: 8 [IU] via SUBCUTANEOUS
  Administered 2016-09-15: 2 [IU] via SUBCUTANEOUS
  Administered 2016-09-15: 15 [IU] via SUBCUTANEOUS
  Filled 2016-09-14: qty 15

## 2016-09-14 MED ORDER — INSULIN ASPART 100 UNIT/ML ~~LOC~~ SOLN
7.0000 [IU] | Freq: Three times a day (TID) | SUBCUTANEOUS | Status: DC
  Administered 2016-09-15 (×2): 7 [IU] via SUBCUTANEOUS
  Filled 2016-09-14: qty 7

## 2016-09-14 MED ORDER — PRAZOSIN HCL 1 MG PO CAPS
1.0000 mg | ORAL_CAPSULE | Freq: Once | ORAL | Status: AC
  Administered 2016-09-14: 1 mg via ORAL
  Filled 2016-09-14: qty 1

## 2016-09-14 MED ORDER — HYDROCHLOROTHIAZIDE 12.5 MG PO CAPS
12.5000 mg | ORAL_CAPSULE | Freq: Every day | ORAL | Status: DC
  Administered 2016-09-15 – 2016-09-19 (×5): 12.5 mg via ORAL
  Filled 2016-09-14 (×5): qty 1

## 2016-09-14 MED ORDER — LOSARTAN POTASSIUM 50 MG PO TABS
100.0000 mg | ORAL_TABLET | Freq: Every day | ORAL | Status: DC
  Administered 2016-09-15 – 2016-09-19 (×5): 100 mg via ORAL
  Filled 2016-09-14 (×6): qty 2

## 2016-09-14 MED ORDER — PANTOPRAZOLE SODIUM 40 MG PO TBEC
40.0000 mg | DELAYED_RELEASE_TABLET | Freq: Two times a day (BID) | ORAL | Status: DC
  Administered 2016-09-14 – 2016-09-19 (×10): 40 mg via ORAL
  Filled 2016-09-14 (×10): qty 1

## 2016-09-14 NOTE — BHH Group Notes (Signed)
[facility] Group Notes:  (Nursing/MHT/Case Management/Adjunct)  Date:  09/14/2016  Time:  10:15 PM  Type of Therapy:  Group Therapy  Participation Level:  Active  Participation Quality:  Appropriate  Affect:  Appropriate  Cognitive:  Alert  Insight:  Appropriate  Engagement in Group:  Engaged  Modes of Intervention:  Support  Summary of Progress/Problems:  [name] 09/14/2016, 10:15 PM

## 2016-09-14 NOTE — ED Notes (Signed)
Pt reports CBG at 246.

## 2016-09-14 NOTE — Progress Notes (Signed)
Inpatient Diabetes Program Recommendations  AACE/ADA: New Consensus Statement on Inpatient Glycemic Control (2015)  Target Ranges:  Prepandial:   less than 140 mg/dL      Peak postprandial:   less than 180 mg/dL (1-2 hours)      Critically ill patients:  140 - 180 mg/dL   Lab Results  Component Value Date   GLUCAP 304 (H) 09/14/2016   Inpatient Diabetes Program Recommendations:     I have called and left a message for Dr. Eddie Dibbles re: current state of blood sugars.  Discussed recent insulin orders with Dr. Bary Leriche;  Please d/c insulin pump orders- patient cannot wear an insulin pump in the [facility] unit.  Please increase Novolog mealtime insulin from 4 units tid to 7 units tid  Please order Novolog correction based on Dr. Sammuel Hines recommendations (see my previous note)  Please increase Lantus insulin to 33 units qhs.  * please note- patient has Type 1 diabetes and has had it since he was aged 72 yrs.   He MUST always have basal insulin, correction and mealtime insulin (meal time insulin is held if he does not eat).   Gentry Fitz, RN, BA, MHA, CDE Diabetes Coordinator Inpatient Diabetes Program  [phone] (Team Pager) [phone] ([place]) 09/14/2016 2:10 PM

## 2016-09-14 NOTE — ED Notes (Signed)
Pt is having a hard time sleeping. I have already given Ambien.

## 2016-09-14 NOTE — ED Notes (Signed)
Patient in bathroom clean Trach, and changing it.

## 2016-09-14 NOTE — Progress Notes (Signed)
Patient pleasant and cooperative during admission assessment. Patient denies SI/HI at this time. Patient denies AVH.Patient is with tracheostomy. Patient informed of fall risk status, fall risk assessed "low" at this time. Patient oriented to unit/staff/room. Patient denies any questions/concerns at this time. Patient safe on unit with Q15 minute checks for safety. Skin assessment & body search done,no contraband found.

## 2016-09-14 NOTE — ED Notes (Signed)
Pt reports CBG at 310.
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
## 2016-09-14 NOTE — ED Notes (Signed)
Pt reports CBG at 401.

## 2016-09-14 NOTE — Progress Notes (Signed)
Recreation Therapy Notes  INPATIENT RECREATION THERAPY ASSESSMENT  Patient Details Name: Yandriel Boening MRN: [id number] DOB: [birth year] Today's Date: 09/14/2016  Patient Stressors: Friends, Work, Other (Comment) (Not many friends; disables and cannot work like he wants to; financial burden; diabetic for 18 yrs; has the trach for 4 years)  Coping Skills:   Isolate, Avoidance, Exercise, Art/Dance, Talking, Music, Sports, Other (Comment) Public librarian)  Personal Challenges: Communication, Concentration, Decision-Making, Relationships, Self-Esteem/Confidence, Social Interaction, Stress Management, Time Management  Leisure Interests (2+):  [place], Individual - Other (Comment) ([place] Site climbing)  Futures trader Resources:  Yes  Community Resources:  Park  Current Use: No  If no, Barriers?: Other (Comment) (Has been so many times he wants something new and feels there is no point in going)  Patient Strengths:  Teeth, personality  Patient Identified Areas of Improvement:  Health, think more positive  Current Recreation Participation:  Sleeping in bed  Patient Goal for Hospitalization:  To get better and get back to the way he used to be  [place] of Residence:  [place] of Residence:  [place]   Current SI (including self-harm):  No  Current HI:  No  Consent to Intern Participation: N/A   Leonette Monarch, LRT/CTRS 09/14/2016, 4:27 PM

## 2016-09-14 NOTE — ED Notes (Signed)
PT. Can check hi sown sugar with his monitor he has on his arm. Just ask him to check when you need it done.

## 2016-09-14 NOTE — ED Notes (Signed)
Sugars were 141 per pt at 2:15 am

## 2016-09-14 NOTE — Tx Team (Signed)
Initial Treatment Plan 09/14/2016 12:05 PM [name] FTN:[id number]    PATIENT STRESSORS: Health problems Occupational concerns   PATIENT STRENGTHS: Average or above average intelligence Capable of independent living Supportive family/friends   PATIENT IDENTIFIED PROBLEMS: Suicidal ideations 09/14/2016  Major depression 09/14/2016                   DISCHARGE CRITERIA:  Ability to meet basic life and health needs Adequate post-discharge living arrangements Verbal commitment to aftercare and medication compliance  PRELIMINARY DISCHARGE PLAN: Attend aftercare/continuing care group Return to previous living arrangement  PATIENT/FAMILY INVOLVEMENT: This treatment plan has been presented to and reviewed with the patient, [name], and/or family member,  The patient and family have been given the opportunity to ask questions and make suggestions.  Merlene Morse, RN 09/14/2016, 12:05 PM

## 2016-09-14 NOTE — Progress Notes (Signed)
1: 1 Note  Pt in group room interacting with visitors. Pt does not look to be in any acute distress. 1:1 staff present with Pt at this time.

## 2016-09-14 NOTE — Progress Notes (Signed)
Inpatient Diabetes Program Recommendations  AACE/ADA: New Consensus Statement on Inpatient Glycemic Control (2015)  Target Ranges:  Prepandial:   less than 140 mg/dL      Peak postprandial:   less than 180 mg/dL (1-2 hours)      Critically ill patients:  140 - 180 mg/dL   Lab Results  Component Value Date   GLUCAP 127 (H) 09/13/2016   Fingerstick blood sugars MUST be taken while patient is in hospital but he can leave his [place] sensor on.  This sensor is not FDA approved for inpatient care.    Pre Dr. Blossom Hoops (endocrinology) dated 09/13/16  Adah Perl from [facility] called in reference to patient being seen in behavioral health and states that patient can not have pump so she is needing instructions on how patient should take insulin. Per Dr. Eddie Dibbles these instructions are as follows:  1.Place patient on 60 gram carb diabetic diet 2. Check blood sugars qac& qhs 3.  Lantus 30 units at bedtime  4.Take Novolog 7 units before each meal 5. Start sliding scale as follows if pre meal glucose is over 150: 150-180 - 1 unit 180-210 - 2 units 210-240 - 3 units 240-270 - 4 units 270-300 - 5 units 300-330 - 6 units 330-360 - 7 units 360-390 - 8 units 390-420 - 9 units Over 421- 10 units  * please order Novolog custom correction scale tid/hs as above  Discussed with Levada Dy, RN at [facility] health to take fingerstick blood sugars  Gentry Fitz, RN, [place], [place], CDE Diabetes Coordinator Inpatient Diabetes Program  [phone] (Team Pager) [phone] ([place]) 09/14/2016 8:22 AM

## 2016-09-14 NOTE — BHH Suicide Risk Assessment (Signed)
[place] INPATIENT:  Family/Significant Other Suicide Prevention Education  Suicide Prevention Education:  Contact Attempts: Joziah Dollins, mother, [phone], has been identified by the patient as the family member/significant other with whom the patient will be residing, and identified as the person(s) who will aid the patient in the event of a mental health crisis.  With written consent from the patient, two attempts were made to provide suicide prevention education, prior to and/or following the patient's discharge.  We were unsuccessful in providing suicide prevention education.  A suicide education pamphlet was given to the patient to share with family/significant other.  Date and time of first attempt:09/14/16, 52 Date and time of second attempt:  Joanne Chars, LCSW 09/14/2016, 3:29 PM

## 2016-09-14 NOTE — ED Notes (Signed)
Pt stated that his BS is up to "220" per his meter and wondering what to do since he is no longer on the insulin pump. Advised pt to wait until breakfast to get dose of novoLOG once breakfast served.

## 2016-09-14 NOTE — Progress Notes (Signed)
Recreation Therapy Notes  Date: 05.10.18 Time: 1:00 pm Location: Craft Room  Group Topic: Leisure Education  Goal Area(s) Addresses:  Patient will identify activities for each letter of the alphabet. Patient will verbalize ability to integrate positive leisure into life post d/c. Patient will verbalize ability to use leisure as a Technical sales engineer.  Behavioral Response: Attentive, Interactive  Intervention: Leisure Alphabet  Activity: Patients were given a Leisure Air traffic controller and were instructed to write healthy leisure activities for each letter of the alphabet.  Education: LRT educated patients on what they need to participate in leisure.  Education Outcome: Acknowledges education/In group clarification offered  Clinical Observations/Feedback: Patient arrived to group at approximately 1:35 pm. LRT explained activity. Patient wrote healthy leisure activities. Patient contributed to group discussion by stating healthy leisure activities, and what he needs to participate in leisure.  Leonette Monarch, LRT/CTRS 09/14/2016 1:57 PM

## 2016-09-14 NOTE — BHH Suicide Risk Assessment (Signed)
[facility] Admission Suicide Risk Assessment   Nursing information obtained from:  Patient Demographic factors:  Male, Divorced or widowed, Caucasian, Living alone Current Mental Status:  NA Loss Factors:  Financial problems / change in socioeconomic status Historical Factors:  Prior suicide attempts Risk Reduction Factors:  NA  Total Time spent with patient: 1 hour Principal Problem: <principal problem not specified> Diagnosis:   Patient Active Problem List   Diagnosis Date Noted  . Major depressive disorder, recurrent severe without psychotic features ([place]) [F33.2] 09/13/2016  . Hypothyroidism [E03.9] 09/13/2016  . HTN (hypertension) [I10] 09/13/2016  . Dyslipidemia [E78.5] 09/13/2016  . GERD (gastroesophageal reflux disease) [K21.9] 09/13/2016   Subjective Data: depression, suicidal ideation.  Continued Clinical Symptoms:  Alcohol Use Disorder Identification Test Final Score (AUDIT): 1 The "Alcohol Use Disorders Identification Test", Guidelines for Use in Primary Care, Second Edition.  World Pharmacologist [facility]). Score between 0-7:  no or low risk or alcohol related problems. Score between 8-15:  moderate risk of alcohol related problems. Score between 16-19:  high risk of alcohol related problems. Score 20 or above:  warrants further diagnostic evaluation for alcohol dependence and treatment.   CLINICAL FACTORS:   Severe Anxiety and/or Agitation Depression:   Severe   Musculoskeletal: Strength & Muscle Tone: within normal limits Gait & Station: normal Patient leans: N/A  Psychiatric Specialty Exam: Physical Exam  Nursing note and vitals reviewed. Psychiatric: His speech is normal and behavior is normal. Thought content normal. His mood appears anxious. Cognition and memory are normal. He expresses impulsivity. He exhibits a depressed mood.    Review of Systems  Psychiatric/Behavioral: Positive for depression and suicidal ideas. The patient is nervous/anxious.   All  other systems reviewed and are negative.   Blood pressure (!) 185/77, pulse 80, temperature 98.2 F (36.8 C), temperature source Oral, resp. rate 20, SpO2 98 %.There is no height or weight on file to calculate BMI.  General Appearance: Casual  Eye Contact:  Good  Speech:  Clear and Coherent  Volume:  Normal  Mood:  Anxious  Affect:  Congruent  Thought Process:  Goal Directed and Descriptions of Associations: Intact  Orientation:  Full (Time, Place, and Person)  Thought Content:  WDL  Suicidal Thoughts:  No  Homicidal Thoughts:  No  Memory:  Immediate;   Fair Recent;   Fair Remote;   Fair  Judgement:  Impaired  Insight:  Present  Psychomotor Activity:  Normal  Concentration:  Concentration: Fair and Attention Span: Fair  Recall:  [employer] of Knowledge:  Fair  Language:  Fair  Akathisia:  No  Handed:  Right  AIMS (if indicated):     Assets:  Communication Skills Desire for Improvement Financial Resources/Insurance Housing Resilience Social Support Transportation  ADL's:  Intact  Cognition:  WNL  Sleep:         COGNITIVE FEATURES THAT CONTRIBUTE TO RISK:  None    SUICIDE RISK:   Severe:  Frequent, intense, and enduring suicidal ideation, specific plan, no subjective intent, but some objective markers of intent (i.e., choice of lethal method), the method is accessible, some limited preparatory behavior, evidence of impaired self-control, severe dysphoria/symptomatology, multiple risk factors present, and few if any protective factors, particularly a lack of social support.  PLAN OF CARE: Hospital admission, medication management, discharge planning.  [name] is a 56 year old male with a history of depression and anxiety admitted for worsening depression, suicidal ideation and auditory command hallucinations.  1. Suicidal ideation. The patient is  able to contract for safety in the hospital.  2. Mood. We continue Lexapro for depression. We discontnued Depakote as  the patient refuses to take it. He claims that auditory hallucinations have resolved.  3. Type I diabetes. The patient uses insulin pump at home. Pump was discontinued at [facility] ER. Medicine input and input from Diabetes Nursse Coordinator is greatly appreciated. I spoke with Gentry Fitz and made Dr. Posey Pronto aware of consultation from Dr. Eddie Dibbles, the primary endocrinologist. He is on Lantus, Novolog tid, ADA diet, SSI, CBG monitoing.  4. Dyslipidemia. He is on Zocor.  5. HTN. He is on ASA, HCTZ, Norvasc and Cozaar.  6. Hypothyroidism. He is on Synthroid.  7. GERD. He is on Protonix.  8. PTSD. We gave a single dose of 1 mg Minipress to see if the patient can tolerate it.  9. Metabolic syndrome monitoring. Lipid panel, TSH, and HgbA1C are pending.  10. EKG. Pending.  11. Disposition. He will be discharged to home. He will follow up with a new psychiatrist.  I certify that inpatient services furnished can reasonably be expected to improve the patient's condition.   Orson Slick, MD 09/14/2016, 5:08 PM

## 2016-09-14 NOTE — ED Provider Notes (Signed)
Pt is ready for transfer. Labs reviewed.  No issues overnight Vitals:   09/13/16 1154 09/13/16 1752  BP: (!) 114/59 132/78  Pulse: 75 75  Resp: 18 18  Temp: 98.4 F (36.9 C)   .    Varney Biles, MD 09/14/16 [phone]

## 2016-09-14 NOTE — ED Notes (Signed)
Dr Darl Householder notified of pt's CBG prior to leaving.  Dr Darl Householder saw pt and gave approval to go a head with the transfer.  Pt in NAD, A&O.

## 2016-09-14 NOTE — Progress Notes (Signed)
Initial Nutrition Assessment  DOCUMENTATION CODES:   Obesity unspecified  INTERVENTION:   Encourage intake of carbohydrate steady meals and snacks and discourage skipping meals.   NUTRITION DIAGNOSIS:   Unintentional weight loss related to poor appetite, other (see comment) (depression ) as evidenced by 5 percent weight loss in 3 weeks.  GOAL:   Patient will meet greater than or equal to 90% of their needs  MONITOR:   PO intake, Weight trends  REASON FOR ASSESSMENT:   Malnutrition Screening Tool    ASSESSMENT:   56 y.o. male with a history of depression, DM type 1, HTN and laryngectomy who presents to the Emergency Department complaining of progressively worsening depression onset four weeks ago. He reports associated anxiety, SOB, auditory hallucinations, suicidal ideation, decreased appetite.  Pt identified for MST. Pt with trach s/p laryngectomy in 2014 which occurred after he was found down for an unknown amount of time and had complications from an intubation. Pt with Type I DM since age 22 and on insulin pump at home; insulin pump held in hospital r/t suicidal attempt. Pt currently eating 75-100% meals. Per chart, pt has lost 12lbs(5%) in 3 weeks; this is significant given history.   Pt seen by a Dietitian at the [facility] on 5/3 and underwent Diabetes Self-Management Education. Pt was noted at this visit to have recent poor appetite and tendency to skip meals. Pt also reports that he often stays in bed all day during periods when he is experiencing depression. Plan recommended for pt at this visit was as follows:  Patient Instructions   Try some frozen microwaveable meals such as Lean cuisine, Healthy Choice, or others.   Try a nutrition drink such as boost for breakfast or lunch.   Try cool or cold foods like chicken or tuna salad, cottage cheese, boiled eggs with crackers or bread.   You can also include some canned (no added sugar) or frozen fruits with  a protein food.   Ideally eat at least a small amount food every 4 hours; start with eating something at least 2 times a day, then increase to 3 times a day, towards eating every 4 hours.    Medications reviewed and include: aspirin, hydrochlorothiazide, insulin, synthroid, protonix  Labs reviewed: BUN 24(H), creat 1.74(H)  Diet Order:  Diet heart healthy/carb modified Room service appropriate? Yes; Fluid consistency: Thin  Skin:  Reviewed, no issues  Last BM:  none since admit  Height:   Ht Readings from Last 1 Encounters:  09/07/16 6' (1.829 m)    Weight:   Wt Readings from Last 1 Encounters:  09/07/16 239 lb 11.2 oz (108.7 kg)    Ideal Body Weight:  80.9 kg  BMI:  There is no height or weight on file to calculate BMI.  Estimated Nutritional Needs:   Kcal:  2100-2400kcal/day   Protein:  87-119g/day   Fluid:  >2L/day   EDUCATION NEEDS:   Education needs no appropriate at this time  Koleen Distance MS, RD, [place] Pager #- [phone]

## 2016-09-14 NOTE — ED Notes (Signed)
Pt reports CBG at 345.

## 2016-09-14 NOTE — H&P (Addendum)
Psychiatric Admission Assessment Adult  Patient Identification: [name] MRN:  [id number] Date of Evaluation:  09/14/2016 Chief Complaint:  Major Depression Disorder Principal Diagnosis: <principal problem not specified> Diagnosis:   Patient Active Problem List   Diagnosis Date Noted  . Major depressive disorder, recurrent severe without psychotic features ([place]) [F33.2] 09/13/2016  . Hypothyroidism [E03.9] 09/13/2016  . HTN (hypertension) [I10] 09/13/2016  . Dyslipidemia [E78.5] 09/13/2016  . GERD (gastroesophageal reflux disease) [K21.9] 09/13/2016   History of Present Illness:   Identifying data. [name] is a 56 year old male with a history of severe depression and anxiety.  Chief complaint. "I had suicidal thoughts and the voices telling me to kill myself but it is over now."  History of present illness. Information was obtained from the patient and the chart. The patient has a long history of depression and anxiety that has worsened in the past month in spite of doubling the dose of antidepressant and addition of Depakote. The patient reports that he stayed in bed for a month and lost 30 lbs. On Monday he suddenly became overwhelmed with thoughts of suicide, severe anxiety and auditory command hallucinations ordering him to kill himself. He came to the hospital asking for help. He endorses all symptoms of depression with poor sleep, decreased appetite, anhedonia, feeling of hopelessness, worthlessness and guilt, poor energy and concentration, social isolation, crying spells, and heightened anxiety. He briefly experienced auditory hallucinations but no other psychotic symptoms. He reports panic attacks and believes that on Monday he experienced a major one. He reports flashbacks and nightmares related to his extended and traumatic hospitalization at [facility] after overdose several years ago. He reports mild symptoms of OCD. He denies any curren alcohol, prescription pill or illicit  substance use.  Past psychiatric history. Long history of depression and anxiety. He has been a patient of Dr. Nicolasa Ducking until about a month ago. He was maintained on Lexapro. He remembers BuSpar, Remeron and Depakote being added to is regimen. He disliked Depakote and BuSpar. He was discharged from Dr. Waylan Boga practice after he overdosed on Remeron. He denies suicide intent, rather he explains that he was taking more Remeron to induce sleep. Insomnia has been a problem all along. He had a serious overdose on opioids that led to extended hospitalization in CCU and tracheostomy. Again, he denies suicide intent but explains that he was taking opioids "to feel better". In the past, but not currently, he abused alcohol, opiods and Xanax.  Family psychiatric history.  Social history. He is divorced. He is now disabled and lives by himself. He has been eating poorly and not engaging in any activities lately. He is type I diabetic and interestingly, takes excellent care of himself and has been trusted with insulin pump for years.   Total Time spent with patient: 1 hour  Is the patient at risk to self? Yes.    Has the patient been a risk to self in the past 6 months? No.  Has the patient been a risk to self within the distant past? Yes.    Is the patient a risk to others? No.  Has the patient been a risk to others in the past 6 months? No.  Has the patient been a risk to others within the distant past? No.   Prior Inpatient Therapy:   Prior Outpatient Therapy:    Alcohol Screening: 1. How often do you have a drink containing alcohol?: Monthly or less 2. How many drinks containing alcohol do you have on  a typical day when you are drinking?: 1 or 2 3. How often do you have six or more drinks on one occasion?: Never Preliminary Score: 0 4. How often during the last year have you found that you were not able to stop drinking once you had started?: Never 5. How often during the last year have you failed to  do what was normally expected from you becasue of drinking?: Never 6. How often during the last year have you needed a first drink in the morning to get yourself going after a heavy drinking session?: Never 7. How often during the last year have you had a feeling of guilt of remorse after drinking?: Never 8. How often during the last year have you been unable to remember what happened the night before because you had been drinking?: Never 9. Have you or someone else been injured as a result of your drinking?: No 10. Has a relative or friend or a doctor or another health worker been concerned about your drinking or suggested you cut down?: No Alcohol Use Disorder Identification Test Final Score (AUDIT): 1 Brief Intervention: AUDIT score less than 7 or less-screening does not suggest unhealthy drinking-brief intervention not indicated Substance Abuse History in the last 12 months:  No. Consequences of Substance Abuse: NA Previous Psychotropic Medications: Yes  Psychological Evaluations: No  Past Medical History:  Past Medical History:  Diagnosis Date  . Depression   . Diabetes (HCC)    Insulin Pump  . Diabetes mellitus without complication ([place])   . GERD (gastroesophageal reflux disease)   . H/O laryngectomy   . Hyperlipidemia   . Hypertension   . Suicide attempt [facility]) 2014   damaged larynx - tracheostomy  . Thyroid disease     Past Surgical History:  Procedure Laterality Date  . LARYNGECTOMY    . TRACHEOSTOMY  2014   from [place] attempt   Family History: History reviewed. No pertinent family history.  Tobacco Screening: Have you used any form of tobacco in the last 30 days? (Cigarettes, Smokeless Tobacco, Cigars, and/or Pipes): No Social History:  History  Alcohol Use No    Comment: Pt denied; BAC not available     History  Drug Use No    Comment: Pt denied; UDS not available    Additional Social History: Marital status: Divorced Divorced, when?: 2001 What types of issues  is patient dealing with in the relationship?: no current relationship Are you sexually active?: No What is your sexual orientation?: heterosexual Has your sexual activity been affected by drugs, alcohol, medication, or emotional stress?: na Does patient have children?: Yes How many children?: 4 How is patient's relationship with their children?: 2 boys, 2 girls.  All adults from age 65- age 47.  Good relationship with kids.                         Allergies:   Allergies  Allergen Reactions  . Buspar [Buspirone]     Makes the patient "flip out"  . Depakote [Valproic Acid]     Causes excessive drowsiness  . Clopidogrel Rash  . Gabapentin Itching and Rash   Lab Results:  Results for orders placed or performed during the hospital encounter of 09/14/16 (from the past 48 hour(s))  Glucose, capillary     Status: Abnormal   Collection Time: 09/14/16 11:54 AM  Result Value Ref Range   Glucose-Capillary 242 (H) 65 - 99 mg/dL  Glucose, capillary  Status: Abnormal   Collection Time: 09/14/16  1:28 PM  Result Value Ref Range   Glucose-Capillary 304 (H) 65 - 99 mg/dL  Glucose, capillary     Status: Abnormal   Collection Time: 09/14/16  4:29 PM  Result Value Ref Range   Glucose-Capillary 288 (H) 65 - 99 mg/dL   Comment 1 Notify RN     Blood Alcohol level:  Lab Results  Component Value Date   ETH <5 09/11/2016   ETH <5 59/56/3875    Metabolic Disorder Labs:  No results found for: HGBA1C, MPG No results found for: PROLACTIN No results found for: CHOL, TRIG, HDL, CHOLHDL, VLDL, LDLCALC  Current Medications: Current Facility-Administered Medications  Medication Dose Route Frequency Provider Last Rate Last Dose  . acetaminophen (TYLENOL) tablet 650 mg  650 mg Oral Q6H PRN Jelena Malicoat B, MD      . alum & mag hydroxide-simeth (MAALOX/MYLANTA) 200-200-20 MG/5ML suspension 30 mL  30 mL Oral Q4H PRN Council Munguia B, MD      . amLODipine (NORVASC) tablet 5 mg  5  mg Oral Daily Kasiah Manka B, MD      . aspirin EC tablet 81 mg  81 mg Oral Daily Tahlor Berenguer B, MD      . escitalopram (LEXAPRO) tablet 20 mg  20 mg Oral Daily Brina Umeda B, MD      . fluticasone (FLONASE) 50 MCG/ACT nasal spray 2 spray  2 spray Each Nare Daily Peggie Hornak B, MD      . hydrochlorothiazide (MICROZIDE) capsule 12.5 mg  12.5 mg Oral Daily Oma Alpert B, MD      . insulin aspart (novoLOG) injection 0-15 Units  0-15 Units Subcutaneous TID WC Amalio Loe B, MD   8 Units at 09/14/16 1649  . insulin aspart (novoLOG) injection 0-5 Units  0-5 Units Subcutaneous QHS Nazir Hacker B, MD      . insulin aspart (novoLOG) injection 4 Units  4 Units Subcutaneous TID WC Dustin Flock, MD   4 Units at 09/14/16 1649  . insulin glargine (LANTUS) injection 30 Units  30 Units Subcutaneous QHS Dustin Flock, MD      . Derrill Memo ON 09/15/2016] levothyroxine (SYNTHROID, LEVOTHROID) tablet 75 mcg  75 mcg Oral QAC breakfast Diamone Whistler B, MD      . loratadine (CLARITIN) tablet 10 mg  10 mg Oral Daily Juelle Dickmann B, MD   10 mg at 09/14/16 1330  . losartan (COZAAR) tablet 100 mg  100 mg Oral Daily Nazaire Cordial B, MD      . pantoprazole (PROTONIX) EC tablet 40 mg  40 mg Oral BID Jennifier Smitherman B, MD   40 mg at 09/14/16 1648  . QUEtiapine (SEROQUEL) tablet 50 mg  50 mg Oral QHS Lindley Stachnik B, MD      . simvastatin (ZOCOR) tablet 20 mg  20 mg Oral Daily Mitsuye Schrodt B, MD   20 mg at 09/14/16 1651   Facility-Administered Medications Ordered in Other Encounters  Medication Dose Route Frequency Provider Last Rate Last Dose  . acetaminophen (TYLENOL) tablet 650 mg  650 mg Oral Q6H PRN Gene Glazebrook B, MD      . alum & mag hydroxide-simeth (MAALOX/MYLANTA) 200-200-20 MG/5ML suspension 30 mL  30 mL Oral Q4H PRN Volney Reierson B, MD      . fluticasone (FLONASE) 50 MCG/ACT nasal spray 2 spray  2 spray Each Nare  Daily Cane Dubray B, MD      . loratadine (CLARITIN)  tablet 10 mg  10 mg Oral Daily Kelia Gibbon B, MD      . magnesium hydroxide (MILK OF MAGNESIA) suspension 30 mL  30 mL Oral Daily PRN Keelen Quevedo B, MD       PTA Medications: Prescriptions Prior to Admission  Medication Sig Dispense Refill Last Dose  . amLODipine (NORVASC) 5 MG tablet Take 5 mg by mouth daily.   09/11/2016 at Unknown time  . aspirin EC 81 MG tablet Take 1 tablet by mouth daily.   09/11/2016 at Unknown time  . divalproex (DEPAKOTE ER) 500 MG 24 hr tablet Take 500 mg by mouth daily.   09/10/2016 at pm  . escitalopram (LEXAPRO) 20 MG tablet Take 20 mg by mouth daily.   09/11/2016 at Unknown time  . Fexofenadine HCl (ALLEGRA PO) Take 180 mg by mouth daily.    09/11/2016 at Unknown time  . fluticasone (FLONASE) 50 MCG/ACT nasal spray Place 2 sprays into both nostrils daily.   09/11/2016 at Unknown time  . hydrochlorothiazide (HYDRODIURIL) 12.5 MG tablet Take 12.5 mg by mouth daily.   09/11/2016 at Unknown time  . insulin aspart (NOVOLOG) 100 UNIT/ML injection Inject 1.15-1.25 Units into the skin See admin instructions. Via insulin pump-basal rate: 1.15-1.25 units/hr (every 8 carbs = 1 unit of insulin bolus) 0000-0800 = 1.15 units/hr and 0801/2359 = 1.25 units/hr   Continuous at Continuous  . levothyroxine (SYNTHROID, LEVOTHROID) 75 MCG tablet Take 75 mcg by mouth daily before breakfast.   09/11/2016 at am  . losartan (COZAAR) 100 MG tablet Take 1 tablet by mouth daily.   09/11/2016 at am  . pantoprazole (PROTONIX) 40 MG tablet Take 1 tablet by mouth 2 (two) times daily.   Past Week at Unknown time  . simvastatin (ZOCOR) 20 MG tablet Take 1 tablet by mouth daily.   09/10/2016 at pm  . [DISCONTINUED] DiphenhydrAMINE HCl (ZZZQUIL) 50 MG/30ML LIQD Take 50 mg by mouth at bedtime as needed (for sleep).    PRN at PRN  . [DISCONTINUED] Diphenhydramine-APAP, sleep, (TYLENOL PM EXTRA STRENGTH PO) Take 1 tablet by mouth at bedtime as  needed (for sleep).    PRN at PRN    Musculoskeletal: Strength & Muscle Tone: within normal limits Gait & Station: normal Patient leans: N/A  Psychiatric Specialty Exam: I reviewed physical exam perfformed by Dr. Posey Pronto and agree with the findings. Physical Exam  Nursing note and vitals reviewed. Psychiatric: His speech is normal and behavior is normal. His mood appears anxious. Cognition and memory are normal. He expresses impulsivity. He exhibits a depressed mood. He expresses suicidal ideation. He expresses suicidal plans.    Review of Systems  Psychiatric/Behavioral: Positive for depression and suicidal ideas. The patient is nervous/anxious.   All other systems reviewed and are negative.   Blood pressure (!) 185/77, pulse 80, temperature 98.2 F (36.8 C), temperature source Oral, resp. rate 20, SpO2 98 %.There is no height or weight on file to calculate BMI.  See SRA.                                                  Sleep:       Treatment Plan Summary: Daily contact with patient to assess and evaluate symptoms and progress in treatment and Medication management   Mr. Rodrigue is a 56 year old male with a history of depression and  anxiety admitted for worsening depression, suicidal ideation and auditory command hallucinations.  1. Suicidal ideation. The patient is able to contract for safety in the hospital.  2. Mood. We continue Lexapro for depression. We discontnued Depakote as the patient refuses to take it. He claims that auditory hallucinations have resolved.  3. Type I diabetes. The patient uses insulin pump at home. Pump was discontinued at [facility] ER. Medicine input and input from Diabetes Nursse Coordinator is greatly appreciated. I spoke with Gentry Fitz about recommendation made by Dr. Eddie Dibbles, endocrinologist,  and made Dr. Posey Pronto aware. The patient is on on Lantus, Novolog tid, ADA diet, SSI, CBG monitoing.  4. PTSD. We gave a single dose of 1 mg  Minipress to see if the patient can tolerate it.  5. Insomnia. The patient did not respond to Trazodone, Remeron or Ambien. The only medication he remembers put him to sleep was Seroquel. We will give 50 mg of Seroquel tonight, may repeat once. I am aware of his diabetes but the patient has not slept in days.  6. Dyslipidemia. He is on Zocor.  7. HTN. He is on ASA, HCTZ, Norvasc and Cozaar.  8. Hypothyroidism. He is on Synthroid.  9. GERD. He is on Protonix.  10. Metabolic syndrome monitoring. Lipid panel, TSH, and HgbA1C are pending.  11. EKG. Pending.  12. Disposition. He will be discharged to home. He will follow up with a new psychiatrist.   Observation Level/Precautions:  15 minute checks  Laboratory:  CBC Chemistry Profile UDS UA  Psychotherapy:    Medications:    Consultations:    Discharge Concerns:    Estimated LOS:  Other:     Physician Treatment Plan for Primary Diagnosis: <principal problem not specified> Long Term Goal(s): Improvement in symptoms so as ready for discharge  Short Term Goals: Ability to identify changes in lifestyle to reduce recurrence of condition will improve, Ability to verbalize feelings will improve, Ability to disclose and discuss suicidal ideas, Ability to demonstrate self-control will improve, Ability to identify and develop effective coping behaviors will improve, Compliance with prescribed medications will improve and Ability to identify triggers associated with substance abuse/mental health issues will improve  Physician Treatment Plan for Secondary Diagnosis: Active Problems:   Major depressive disorder, recurrent severe without psychotic features (HCC)   Hypothyroidism   HTN (hypertension)   Dyslipidemia   GERD (gastroesophageal reflux disease)  Long Term Goal(s): NA  Short Term Goals: NA  I certify that inpatient services furnished can reasonably be expected to improve the patient's condition.    Orson Slick,  MD 5/10/20185:37 PM

## 2016-09-14 NOTE — [facility] Group Notes (Signed)
[employer] Group Notes:  (Nursing/MHT/Case Management/Adjunct)  Date:  09/14/2016  Time:  6:04 PM  Type of Therapy:  Psychoeducational Skills  Participation Level:  Did Not Attend  Adela Lank [facility] 09/14/2016, 6:04 PM

## 2016-09-14 NOTE — ED Notes (Signed)
Patient CBG was 416, The Nurse was informed.

## 2016-09-14 NOTE — Consult Note (Signed)
[facility] NAME: [name]    MR#:  [id number]  DATE OF BIRTH:  1960-11-23  DATE OF ADMISSION:  09/14/2016  PRIMARY CARE PHYSICIAN: Derinda Late, MD   REQUESTING/REFERRING PHYSICIAN: Dr. Hilary Hertz   CHIEF COMPLAINT:  No chief complaint on file.   HISTORY OF PRESENT ILLNESS: [name]  is a 56 y.o. male with a known history of  diabetes who is on insulin pump., Depression, GERD, history of laryngectomy who is admitted to behavioral medicine with suicidal ideation. Patient is normally on insulin pump. However due to his suicidal attempt I have been told by psychiatry that they are not able to use an insulin pump in there. Patient reports that last night he received 30 units of Lantus. His blood sugars have been in the 400s here. Patient otherwise denies any complaints no chest pain or shortness of breath no nausea vomiting or diarrhea PAST MEDICAL HISTORY:   Past Medical History:  Diagnosis Date  . Depression   . Diabetes (HCC)    Insulin Pump  . Diabetes mellitus without complication ([place])   . GERD (gastroesophageal reflux disease)   . H/O laryngectomy   . Hyperlipidemia   . Hypertension   . Suicide attempt [facility]) 2014   damaged larynx - tracheostomy  . Thyroid disease     PAST SURGICAL HISTORY: Past Surgical History:  Procedure Laterality Date  . TRACHEOSTOMY  2014   from SI attempt    SOCIAL HISTORY:  Social History  Substance Use Topics  . Smoking status: Former Smoker    Packs/day: 0.00    Types: Cigarettes    Quit date: 04/09/2013  . Smokeless tobacco: Never Used  . Alcohol use No     Comment: Pt denied; BAC not available    FAMILY HISTORY: History reviewed. No pertinent family history.  DRUG ALLERGIES:  Allergies  Allergen Reactions  . Buspar [Buspirone]     Makes the patient "flip out"  . Depakote [Valproic Acid]     Causes excessive drowsiness  . Clopidogrel Rash  . Gabapentin Itching and Rash     REVIEW OF SYSTEMS:   CONSTITUTIONAL: No fever, fatigue or weakness.  EYES: No blurred or double vision.  EARS, NOSE, AND THROAT: No tinnitus or ear pain.  RESPIRATORY: No cough, shortness of breath, wheezing or hemoptysis.  CARDIOVASCULAR: No chest pain, orthopnea, edema.  GASTROINTESTINAL: No nausea, vomiting, diarrhea or abdominal pain.  GENITOURINARY: No dysuria, hematuria.  ENDOCRINE: No polyuria, nocturia,  HEMATOLOGY: No anemia, easy bruising or bleeding SKIN: No rash or lesion. MUSCULOSKELETAL: No joint pain or arthritis.   NEUROLOGIC: No tingling, numbness, weakness.  PSYCHIATRY: No anxiety or depression.   MEDICATIONS AT HOME:  Prior to Admission medications   Medication Sig Start Date End Date Taking? Authorizing Provider  amLODipine (NORVASC) 5 MG tablet Take 5 mg by mouth daily.    [provider]  aspirin EC 81 MG tablet Take 1 tablet by mouth daily.    [provider]  divalproex (DEPAKOTE ER) 500 MG 24 hr tablet Take 500 mg by mouth daily.    [provider]  escitalopram (LEXAPRO) 20 MG tablet Take 20 mg by mouth daily. 08/17/16   [provider]  Fexofenadine HCl (ALLEGRA PO) Take 180 mg by mouth daily.     [provider]  fluticasone (FLONASE) 50 MCG/ACT nasal spray Place 2 sprays into both nostrils daily.    [provider]  hydrochlorothiazide (HYDRODIURIL)  12.5 MG tablet Take 12.5 mg by mouth daily. 02/15/15   [provider]  insulin aspart (NOVOLOG) 100 UNIT/ML injection Inject 1.15-1.25 Units into the skin See admin instructions. Via insulin pump-basal rate: 1.15-1.25 units/hr (every 8 carbs = 1 unit of insulin bolus) 0000-0800 = 1.15 units/hr and 0801/2359 = 1.25 units/hr 06/26/16   [provider]  levothyroxine (SYNTHROID, LEVOTHROID) 75 MCG tablet Take 75 mcg by mouth daily before breakfast.    [provider]  losartan (COZAAR) 100 MG tablet Take 1 tablet by mouth daily.  04/26/16   [provider]  pantoprazole (PROTONIX) 40 MG tablet Take 1 tablet by mouth 2 (two) times daily. 05/09/16   [provider]  simvastatin (ZOCOR) 20 MG tablet Take 1 tablet by mouth daily.    [provider]      PHYSICAL EXAMINATION:   VITAL SIGNS: Blood pressure (!) 185/77, pulse 80, temperature 98.2 F (36.8 C), temperature source Oral, resp. rate 20, SpO2 98 %.  GENERAL:  56 y.o.-year-old patient lying in the bed with no acute distress.  EYES: Pupils equal, round, reactive to light and accommodation. No scleral icterus. Extraocular muscles intact.  HEENT: Head atraumatic, normocephalic. Oropharynx and nasopharynx clear.  NECK:  Supple, no jugular venous distention. No thyroid enlargement, no tenderness.  LUNGS: Normal breath sounds bilaterally, no wheezing, rales,rhonchi or crepitation. No use of accessory muscles of respiration.  CARDIOVASCULAR: S1, S2 normal. No murmurs, rubs, or gallops.  ABDOMEN: Soft, nontender, nondistended. Bowel sounds present. No organomegaly or mass.  EXTREMITIES: No pedal edema, cyanosis, or clubbing.  NEUROLOGIC: Cranial nerves II through XII are intact. Muscle strength 5/5 in all extremities. Sensation intact. Gait not checked.  PSYCHIATRIC: The patient is alert and oriented x 3.  SKIN: No obvious rash, lesion, or ulcer.   LABORATORY PANEL:   CBC  Recent Labs Lab 09/11/16 1851  WBC 10.2  HGB 16.5  HCT 47.6  PLT 315  MCV 88.3  MCH 30.6  MCHC 34.7  RDW 12.6   ------------------------------------------------------------------------------------------------------------------  Chemistries   Recent Labs Lab 09/11/16 1851  NA 136  K 3.9  CL 103  CO2 21*  GLUCOSE 195*  BUN 24*  CREATININE 1.74*  CALCIUM 9.5  AST 18  ALT 17  ALKPHOS 92  BILITOT 0.8   ------------------------------------------------------------------------------------------------------------------ estimated creatinine clearance is  62.5 mL/min (A) (by C-G formula based on SCr of 1.74 mg/dL (H)). ------------------------------------------------------------------------------------------------------------------ No results for input(s): TSH, T4TOTAL, T3FREE, THYROIDAB in the last 72 hours.  Invalid input(s): FREET3   Coagulation profile No results for input(s): INR, PROTIME in the last 168 hours. ------------------------------------------------------------------------------------------------------------------- No results for input(s): DDIMER in the last 72 hours. -------------------------------------------------------------------------------------------------------------------  Cardiac Enzymes No results for input(s): CKMB, TROPONINI, MYOGLOBIN in the last 168 hours.  Invalid input(s): CK ------------------------------------------------------------------------------------------------------------------ Invalid input(s): POCBNP  ---------------------------------------------------------------------------------------------------------------  Urinalysis    Component Value Date/Time   COLORURINE Straw 02/19/2014 1439   APPEARANCEUR Clear 02/19/2014 1439   LABSPEC 1.010 02/19/2014 1439   PHURINE 7.0 02/19/2014 1439   GLUCOSEU >=500 02/19/2014 1439   HGBUR Negative 02/19/2014 1439   BILIRUBINUR Negative 02/19/2014 1439   KETONESUR Negative 02/19/2014 1439   PROTEINUR Negative 02/19/2014 1439   NITRITE Negative 02/19/2014 1439   LEUKOCYTESUR Negative 02/19/2014 1439     RADIOLOGY: No results found.  EKG: Orders placed or performed during the hospital encounter of 09/14/16  . EKG 12-Lead  . EKG 12-Lead    IMPRESSION AND PLAN: Patient is a 56 year old white male with  diabetes on insulin pump now admitted to psychiatry  1. Diabetes type II on insulin pump I will go ahead and give him Lantus 30 units daily at bedtime. Continue sliding scale insulin We will also place him on pre-meal insulin I will place  Lantus and pre-meal insulin based on his blood sugars  2. Essential hypertension Continue Norvasc, hydrochlorothiazide and losartan  3. hypothyrodism continue Synthroid  4. Hyperlipemia continue simvastatin  5. misc: ambulatory      All the records are reviewed and case discussed with ED provider. Management plans discussed with the patient, family and they are in agreement.  CODE STATUS:    Code Status Orders        Start     Ordered   09/14/16 1211  Full code  Continuous     09/14/16 1213    Code Status History    Date Active Date Inactive Code Status Order ID Comments User Context   09/13/2016  5:33 PM 09/14/2016 10:33 AM Full Code [id number]  Clovis Fredrickson, MD Inpatient   09/12/2016  2:07 AM 09/13/2016  5:33 PM Full Code [id number]  Ezequiel Essex, MD ED   08/27/2016  3:18 PM 08/28/2016  4:13 PM Full Code [id number]  Margarita Mail, PA-C ED       TOTAL TIME TAKING CARE OF THIS PATIENT: 55 minutes.    Dustin Flock M.D on 09/14/2016 at 12:56 PM  Between 7am to 6pm - Pager - [phone]  After 6pm go to [email or web address] - password [email or web address] [facility]  [facility]  Office  [phone]  CC: Primary care physician; Derinda Late, MD

## 2016-09-15 DIAGNOSIS — Z5181 Encounter for therapeutic drug level monitoring: Secondary | ICD-10-CM

## 2016-09-15 LAB — GLUCOSE, CAPILLARY
GLUCOSE-CAPILLARY: 380 mg/dL — AB (ref 65–99)
GLUCOSE-CAPILLARY: 152 mg/dL — AB (ref 65–99)
GLUCOSE-CAPILLARY: 123 mg/dL — AB (ref 65–99)
Glucose-Capillary: 137 mg/dL — ABNORMAL HIGH (ref 65–99)

## 2016-09-15 LAB — LIPID PANEL
CHOLESTEROL: 161 mg/dL (ref 0–200)
HDL: 41 mg/dL (ref >40)
LDL Cholesterol: 82 mg/dL (ref 0–99)
Total CHOL/HDL Ratio: 3.9 RATIO
Triglycerides: 190 mg/dL — ABNORMAL HIGH (ref <150)
VLDL: 38 mg/dL (ref 0–40)

## 2016-09-15 LAB — TSH: TSH: 1.952 u[IU]/mL (ref 0.350–4.500)

## 2016-09-15 MED ORDER — INSULIN ASPART 100 UNIT/ML ~~LOC~~ SOLN
0.0000 [IU] | Freq: Three times a day (TID) | SUBCUTANEOUS | Status: DC
  Administered 2016-09-15 – 2016-09-16 (×2): 2 [IU] via SUBCUTANEOUS
  Administered 2016-09-16: 3 [IU] via SUBCUTANEOUS
  Administered 2016-09-17: 5 [IU] via SUBCUTANEOUS
  Administered 2016-09-17: 1 [IU] via SUBCUTANEOUS
  Administered 2016-09-18: 9 [IU] via SUBCUTANEOUS
  Administered 2016-09-18: 2 [IU] via SUBCUTANEOUS
  Administered 2016-09-18: 5 [IU] via SUBCUTANEOUS
  Administered 2016-09-19: 9 [IU] via SUBCUTANEOUS
  Filled 2016-09-15: qty 3
  Filled 2016-09-15: qty 2
  Filled 2016-09-15: qty 5
  Filled 2016-09-15: qty 2
  Filled 2016-09-15 (×2): qty 9

## 2016-09-15 MED ORDER — QUETIAPINE FUMARATE 100 MG PO TABS
100.0000 mg | ORAL_TABLET | Freq: Every day | ORAL | Status: DC
  Administered 2016-09-15: 100 mg via ORAL
  Filled 2016-09-15: qty 1

## 2016-09-15 MED ORDER — FLUVOXAMINE MALEATE 50 MG PO TABS
50.0000 mg | ORAL_TABLET | Freq: Every day | ORAL | Status: DC
  Administered 2016-09-15 – 2016-09-16 (×2): 50 mg via ORAL
  Filled 2016-09-15 (×2): qty 1

## 2016-09-15 MED ORDER — PRAZOSIN HCL 1 MG PO CAPS
1.0000 mg | ORAL_CAPSULE | Freq: Two times a day (BID) | ORAL | Status: DC
  Administered 2016-09-15 – 2016-09-16 (×3): 1 mg via ORAL
  Filled 2016-09-15 (×3): qty 1

## 2016-09-15 MED ORDER — INSULIN ASPART 100 UNIT/ML ~~LOC~~ SOLN
9.0000 [IU] | Freq: Three times a day (TID) | SUBCUTANEOUS | Status: DC
  Administered 2016-09-15 – 2016-09-16 (×2): 9 [IU] via SUBCUTANEOUS
  Filled 2016-09-15 (×2): qty 9

## 2016-09-15 NOTE — Progress Notes (Signed)
Inpatient Diabetes Program Recommendations  AACE/ADA: New Consensus Statement on Inpatient Glycemic Control (2015)  Target Ranges:  Prepandial:   less than 140 mg/dL      Peak postprandial:   less than 180 mg/dL (1-2 hours)      Critically ill patients:  140 - 180 mg/dL   Lab Results  Component Value Date   GLUCAP 137 (H) 09/15/2016    Review of Glycemic Control  Results for ALBERTA, CAIRNS (MRN [id number]) as of 09/15/2016 08:48  Ref. Range 09/14/2016 11:54 09/14/2016 13:28 09/14/2016 16:29 09/14/2016 20:35 09/15/2016 06:41  Glucose-Capillary Latest Ref Range: 65 - 99 mg/dL 242 (H) 304 (H) 288 (H) 238 (H) 137 (H)    Inpatient Diabetes Program Recommendations:   Fasting blood sugar ideal- agree with current orders.   * please note- patient has Type 1 diabetes and has had it since he was aged 56 yrs.   He MUST always have basal insulin, correction and mealtime insulin (meal time insulin is held if he does not eat).   Gentry Fitz, RN, BA, MHA, CDE Diabetes Coordinator Inpatient Diabetes Program  [phone] (Team Pager) [phone] ([place]) 09/15/2016 8:50 AM

## 2016-09-15 NOTE — BHH Group Notes (Signed)
[facility] Group Notes:  (Nursing/MHT/Case Management/Adjunct)  Date:  09/15/2016  Time:  4:22 PM  Type of Therapy:  Psychoeducational Skills  Participation Level:  Active  Participation Quality:  Appropriate, Attentive and Supportive  Affect:  Appropriate  Cognitive:  Appropriate  Insight:  Appropriate  Engagement in Group:  Engaged and Supportive  Modes of Intervention:  Discussion and Education  Summary of Progress/Problems:  Charise Killian 09/15/2016, 4:22 PM

## 2016-09-15 NOTE — Progress Notes (Signed)
Inpatient Diabetes Program Recommendations  AACE/ADA: New Consensus Statement on Inpatient Glycemic Control (2015)  Target Ranges:  Prepandial:   less than 140 mg/dL      Peak postprandial:   less than 180 mg/dL (1-2 hours)      Critically ill patients:  140 - 180 mg/dL   Lab Results  Component Value Date   GLUCAP 380 (H) 09/15/2016     Inpatient Diabetes Program Recommendations:   Please change the Novolog correction scale to sensitive 0-9 units which mimics more, the scale recommended by Dr. Eddie Dibbles. Discussed with Dr. Bary Leriche.   Please ensure that the correction insulin (sliding scale) is given within 1 hour of the blood sugar being checked so that we are dosing accurately based on a current blood sugar. The Novolog mealtime insulin (set dose) is given ideally, within minutes of eating the meal.    The Novolog correction and Novolog mealtime can be given at different times if the patient is not ready to eat when the correction Novolog is given.      Gentry Fitz, RN, BA, MHA, CDE Diabetes Coordinator Inpatient Diabetes Program  [phone] (Team Pager) [phone] ([place]) 09/15/2016 12:20 PM

## 2016-09-15 NOTE — Progress Notes (Signed)
Patient is pleasant & cooperative in the unit.Denies suicidal or homicidal ideations and AV hallucinations.Patient stated that he could sleep better last night.Appropriate with staff & peers.Patient stated that he had 1x diarrhea.Patient want to wait to take any medicine.Appetite & energy level good.Support & encouragement given.

## 2016-09-15 NOTE — BHH Suicide Risk Assessment (Signed)
[facility] INPATIENT:  Family/Significant Other Suicide Prevention Education  Suicide Prevention Education:  Education Completed; [name], mother, [phone], has been identified by the patient as the family member/significant other with whom the patient will be residing, and identified as the person(s) who will aid the patient in the event of a mental health crisis (suicidal ideations/suicide attempt).  With written consent from the patient, the family member/significant other has been provided the following suicide prevention education, prior to the and/or following the discharge of the patient.  The suicide prevention education provided includes the following:  Suicide risk factors  Suicide prevention and interventions  National Suicide Hotline telephone number  [facility] assessment telephone number  [facility] Emergency Assistance [place] and/or Residential Mobile Crisis Unit telephone number  Request made of family/significant other to:  Remove weapons (e.g., guns, rifles, knives), all items previously/currently identified as safety concern. No guns per [name].  Family took them in 2014.  Remove drugs/medications (over-the-counter, prescriptions, illicit drugs), all items previously/currently identified as a safety concern.  [name] does not think pt has excess medications.  The family member/significant other verbalizes understanding of the suicide prevention education information provided.  The family member/significant other agrees to remove the items of safety concern listed above.  [name] reports she stays in close contact with pt and was aware that he has been doing poorly this past month.  She will continue to be in contact after he is discharged.  Joel Chars, LCSW 09/15/2016, 2:31 PM

## 2016-09-15 NOTE — Progress Notes (Signed)
Recreation Therapy Notes  Date: 05.11.18 Time: 1:00 pm Location: Craft Room  Group Topic: Social Skills  Goal Area(s) Addresses:  Patient will effectively work with peers towards shared goal. Patient will identify skill used to make activity successful. Patient will identify how skills used during activities can be used to reach post d/c goals.  Behavioral Response: Attentive, Interactive  Intervention: Life Boat  Activity: Patients were given a scenario that we were in [place] and were decided to take a boat out to explore. While on the boat, the boat sprung a leak and it was going down. There are 2 life boat. One is bigger, faster, and nicer. The other is small. The patients are given a list of people [name], nurse, teacher, ect.). The patients were put in charge of deciding who goes where. The bigger boat fits everyone in the room plus 8 people. The smaller boat seats 8 people.  Education: LRT educated patients on healthy support systems.  Education Outcome: Acknowledges education/In group clarification offered  Clinical Observations/Feedback: Patient worked with peers towards shared goal. Patient used effective communication, problem solving, and Scientist, clinical (histocompatibility and immunogenetics). Patient contributed to group discussion by stating what skills he used in group, why these skills are important, what prevents him from using these skills, and what would change for him if he used these skills appropriately.  Leonette Monarch, LRT/CTRS 09/15/2016 2:08 PM

## 2016-09-15 NOTE — Progress Notes (Signed)
[facility] MD Progress Note  09/15/2016 9:36 AM [name]  MRN:  [id number]  Subjective:  [name] is a 56 year old male with a history of depression and anxiety admitted for suicidal thoughts, auditory hallucinations, severe anxiety, and paninsomnia. Started Seroquel for sleep, Minipress for PTSD and Luvox 50 mg.  09/15/2016. [name] met with treatment team. He feels somewhat better as he was able to sleep with Seroquel. He had to take additional 50 mg last night. He is not too drowsy. He did not have any side effects from Minipress either. Vital signs are stable. He is on Lantus and Novolog now. There were no behavioral or safety problems and we were able to discontinue sitter. Good program participation.   Per nursing: Pt at this time is sitting in bed interacting with 1:1 staff. Pt does not look to be in any acute distress. 1:1 monitoring continues for Pt's safety. 15-minute safety checks also continues at this time.  Principal Problem: Major depressive disorder, recurrent severe without psychotic features ([place]) Diagnosis:   Patient Active Problem List   Diagnosis Date Noted  . Type 1 diabetes mellitus ([place]) [E10.9] 09/14/2016  . Major depressive disorder, recurrent severe without psychotic features ([place]) [F33.2] 09/13/2016  . Hypothyroidism [E03.9] 09/13/2016  . HTN (hypertension) [I10] 09/13/2016  . Dyslipidemia [E78.5] 09/13/2016  . GERD (gastroesophageal reflux disease) [K21.9] 09/13/2016   Total Time spent with patient: 30 minutes  Past Psychiatric History: Depression, anxiety.  Past Medical History:  Past Medical History:  Diagnosis Date  . Depression   . Diabetes (HCC)    Insulin Pump  . Diabetes mellitus without complication ([place])   . GERD (gastroesophageal reflux disease)   . H/O laryngectomy   . Hyperlipidemia   . Hypertension   . Suicide attempt [facility]) 2014   damaged larynx - tracheostomy  . Thyroid disease     Past Surgical History:  Procedure Laterality  Date  . LARYNGECTOMY    . TRACHEOSTOMY  2014   from [place] attempt   Family History: History reviewed. No pertinent family history. Family Psychiatric  History: See H&P. Social History:  History  Alcohol Use No    Comment: Pt denied; BAC not available     History  Drug Use No    Comment: Pt denied; UDS not available    Social History   Social History  . Marital status: Single    Spouse name: N/A  . Number of children: N/A  . Years of education: N/A   Social History Main Topics  . Smoking status: Former Smoker    Packs/day: 0.00    Types: Cigarettes    Quit date: 04/09/2013  . Smokeless tobacco: Never Used  . Alcohol use No     Comment: Pt denied; BAC not available  . Drug use: No     Comment: Pt denied; UDS not available  . Sexual activity: No   Other Topics Concern  . None   Social History Narrative  . None   Additional Social History:                         Sleep: Fair  Appetite:  Fair  Current Medications: Current Facility-Administered Medications  Medication Dose Route Frequency Provider Last Rate Last Dose  . acetaminophen (TYLENOL) tablet 650 mg  650 mg Oral Q6H PRN Kuper Rennels B, MD   650 mg at 09/14/16 1746  . alum & mag hydroxide-simeth (MAALOX/MYLANTA) 200-200-20 MG/5ML suspension 30 mL  30 mL Oral Q4H PRN Gino Garrabrant B, MD      . amLODipine (NORVASC) tablet 5 mg  5 mg Oral Daily Sumner Boesch B, MD   5 mg at 09/15/16 0833  . aspirin EC tablet 81 mg  81 mg Oral Daily Allyn Bartelson B, MD   81 mg at 09/15/16 0833  . escitalopram (LEXAPRO) tablet 20 mg  20 mg Oral Daily Maley Venezia B, MD   20 mg at 09/15/16 0833  . fluticasone (FLONASE) 50 MCG/ACT nasal spray 2 spray  2 spray Each Nare Daily Malcolm Quast B, MD   2 spray at 09/15/16 0830  . hydrochlorothiazide (MICROZIDE) capsule 12.5 mg  12.5 mg Oral Daily Yussef Jorge B, MD   12.5 mg at 09/15/16 0833  . insulin aspart (novoLOG) injection 0-15  Units  0-15 Units Subcutaneous TID WC Carmelo [name] B, MD   2 Units at 09/15/16 0832  . insulin aspart (novoLOG) injection 0-5 Units  0-5 Units Subcutaneous QHS Cleo Villamizar B, MD   2 Units at 09/14/16 2214  . insulin aspart (novoLOG) injection 7 Units  7 Units Subcutaneous TID WC Dustin Flock, MD   7 Units at 09/15/16 0834  . insulin glargine (LANTUS) injection 30 Units  30 Units Subcutaneous QHS Dustin Flock, MD   30 Units at 09/14/16 2213  . levothyroxine (SYNTHROID, LEVOTHROID) tablet 75 mcg  75 mcg Oral QAC breakfast Kylene Zamarron B, MD   75 mcg at 09/15/16 0659  . loratadine (CLARITIN) tablet 10 mg  10 mg Oral Daily Mekala Winger B, MD   10 mg at 09/15/16 0833  . losartan (COZAAR) tablet 100 mg  100 mg Oral Daily Kyliyah Stirn B, MD   100 mg at 09/15/16 0833  . pantoprazole (PROTONIX) EC tablet 40 mg  40 mg Oral BID Lorin Gawron B, MD   40 mg at 09/15/16 0833  . QUEtiapine (SEROQUEL) tablet 50 mg  50 mg Oral QHS Lian Tanori B, MD   50 mg at 09/14/16 2211  . simvastatin (ZOCOR) tablet 20 mg  20 mg Oral Daily Saatvik Thielman B, MD   20 mg at 09/15/16 0831   Facility-Administered Medications Ordered in Other Encounters  Medication Dose Route Frequency Provider Last Rate Last Dose  . acetaminophen (TYLENOL) tablet 650 mg  650 mg Oral Q6H PRN Makinsley Schiavi B, MD      . alum & mag hydroxide-simeth (MAALOX/MYLANTA) 200-200-20 MG/5ML suspension 30 mL  30 mL Oral Q4H PRN Zamantha Strebel B, MD      . fluticasone (FLONASE) 50 MCG/ACT nasal spray 2 spray  2 spray Each Nare Daily Maisha Bogen B, MD      . loratadine (CLARITIN) tablet 10 mg  10 mg Oral Daily Singleton Hickox B, MD      . magnesium hydroxide (MILK OF MAGNESIA) suspension 30 mL  30 mL Oral Daily PRN Leonardo Plaia B, MD        Lab Results:  Results for orders placed or performed during the hospital encounter of 09/14/16 (from the past 48 hour(s))  Glucose,  capillary     Status: Abnormal   Collection Time: 09/14/16 11:54 AM  Result Value Ref Range   Glucose-Capillary 242 (H) 65 - 99 mg/dL  Glucose, capillary     Status: Abnormal   Collection Time: 09/14/16  1:28 PM  Result Value Ref Range   Glucose-Capillary 304 (H) 65 - 99 mg/dL  Glucose, capillary     Status: Abnormal   Collection  Time: 09/14/16  4:29 PM  Result Value Ref Range   Glucose-Capillary 288 (H) 65 - 99 mg/dL   Comment 1 Notify RN   Glucose, capillary     Status: Abnormal   Collection Time: 09/14/16  8:35 PM  Result Value Ref Range   Glucose-Capillary 238 (H) 65 - 99 mg/dL  Glucose, capillary     Status: Abnormal   Collection Time: 09/15/16  6:41 AM  Result Value Ref Range   Glucose-Capillary 137 (H) 65 - 99 mg/dL  Lipid panel     Status: Abnormal   Collection Time: 09/15/16  7:11 AM  Result Value Ref Range   Cholesterol 161 0 - 200 mg/dL   Triglycerides 190 (H) <150 mg/dL   HDL 41 >40 mg/dL   Total CHOL/HDL Ratio 3.9 RATIO   VLDL 38 0 - 40 mg/dL   LDL Cholesterol 82 0 - 99 mg/dL    Comment:        Total Cholesterol/HDL:CHD Risk Coronary Heart Disease Risk Table                     Men   Women  1/2 Average Risk   3.4   3.3  Average Risk       5.0   4.4  2 X Average Risk   9.6   7.1  3 X Average Risk  23.4   11.0        Use the calculated Patient Ratio above and the CHD Risk Table to determine the patient's CHD Risk.        ATP III CLASSIFICATION (LDL):  <100     mg/dL   Optimal  100-129  mg/dL   Near or Above                    Optimal  130-159  mg/dL   Borderline  160-189  mg/dL   High  >190     mg/dL   Very High   TSH     Status: None   Collection Time: 09/15/16  7:11 AM  Result Value Ref Range   TSH 1.952 0.350 - 4.500 uIU/mL    Comment: Performed by a 3rd Generation assay with a functional sensitivity of <=0.01 uIU/mL.    Blood Alcohol level:  Lab Results  Component Value Date   ETH <5 09/11/2016   ETH <5 61/60/7371    Metabolic Disorder  Labs: No results found for: HGBA1C, MPG No results found for: PROLACTIN Lab Results  Component Value Date   CHOL 161 09/15/2016   TRIG 190 (H) 09/15/2016   HDL 41 09/15/2016   CHOLHDL 3.9 09/15/2016   VLDL 38 09/15/2016   LDLCALC 82 09/15/2016    Physical Findings: AIMS:  , ,  ,  ,    CIWA:    COWS:     Musculoskeletal: Strength & Muscle Tone: within normal limits Gait & Station: normal Patient leans: N/A  Psychiatric Specialty Exam: Physical Exam  Nursing note and vitals reviewed. Psychiatric: His speech is normal. Thought content normal. He is withdrawn. Cognition and memory are normal. He expresses impulsivity. He exhibits a depressed mood.    Review of Systems  Psychiatric/Behavioral: Positive for depression. The patient is nervous/anxious and has insomnia.   All other systems reviewed and are negative.   Blood pressure 132/83, pulse 70, temperature 98.2 F (36.8 C), temperature source Oral, resp. rate 12, SpO2 99 %.There is no height or weight on file to calculate BMI.  General Appearance: Casual  Eye Contact:  Good  Speech:  Clear and Coherent  Volume:  Normal  Mood:  Anxious  Affect:  Congruent  Thought Process:  Goal Directed and Descriptions of Associations: Intact  Orientation:  Full (Time, Place, and Person)  Thought Content:  WDL  Suicidal Thoughts:  No  Homicidal Thoughts:  No  Memory:  Immediate;   Fair Recent;   Fair Remote;   Fair  Judgement:  Fair  Insight:  Fair  Psychomotor Activity:  Normal  Concentration:  Concentration: Fair and Attention Span: Fair  Recall:  [employer] of Knowledge:  Fair  Language:  Fair  Akathisia:  No  Handed:  Right  AIMS (if indicated):     Assets:  Communication Skills Desire for Improvement Financial Resources/Insurance Housing Resilience Social Support Transportation  ADL's:  Intact  Cognition:  WNL  Sleep:        Treatment Plan Summary: Daily contact with patient to assess and evaluate symptoms  and progress in treatment and Medication management   [name] is a 56 year old male with a history of depression and anxiety admitted for worsening depression, suicidal ideation and auditory command hallucinations.  1. Suicidal ideation. The patient is able to contract for safety in the hospital.  2. Mood. We continue Lexapro for depression. We discontnued Depakote as the patient refuses to take it. He claims that auditory hallucinations have resolved. We will give Luvox 50 mg. If he is able to tolerate it, will titrate it.  3. Type I diabetes. The patient uses insulin pump at home. Pump was discontinued at [facility]. Medicine input and input from Diabetes Nursse Coordinator is greatly appreciated. I spoke with Gentry Fitz about recommendation made by Dr. Eddie Dibbles, endocrinologist,  and made Dr. Posey Pronto aware. The patient is on on Lantus, Novolog tid, ADA diet, SSI, CBG monitoing.  4. PTSD. He tolerated a single dose of 1 mg Minipress well. Minipress 1 mg twice daily for nightmares and flashbacks.   5. Insomnia. The patient did not respond to Trazodone, Remeron or Ambien. The only medication he remembers put him to sleep was Seroquel. He reports good sleep with 100 mg Seroquel. I will keep Seroquel for now. I am aware of his diabetes but the patient has not slept in days.  6. Dyslipidemia. He is on Zocor.  7. HTN. He is on ASA, HCTZ, Norvasc and Cozaar.  8. Hypothyroidism. He is on Synthroid.  9. GERD. He is on Protonix.  10. Metabolic syndrome monitoring. Lipid panel shows slightly elevated triglycerides, normal TSH. HgbA1C is pending.  11. EKG. Was ordered but still pending.  12. Disposition. He will be discharged to home. He will follow up with a new psychiatrist.     Orson Slick, MD 09/15/2016, 9:36 AM

## 2016-09-15 NOTE — Progress Notes (Signed)
1:1 Note  Pt visible ambulating the hallways. 1:1 staff also visible with the Pt. Pt does not look to be in any acute distress at this time. 1:1 monitoring continues for Pt's safety. 15-minute safety checks also continues at this time.

## 2016-09-15 NOTE — [facility] Group Notes (Signed)
[place] LCSW Group Therapy  09/15/2016 10:33 AM  Type of Therapy:  Group Therapy  Participation Level:  Active  Participation Quality:  Appropriate and Sharing  Affect:  Appropriate  Cognitive:  Alert  Insight:  Developing/Improving  Engagement in Therapy:  Developing/Improving  Modes of Intervention:  Activity, Clarification, Discussion, Education, Exploration, Limit-setting, Problem-solving, Reality Testing, Socialization and Support  Summary of Progress/Problems: Stress management: Patients defined and discussed the topic of stress and the related symptoms and triggers for stress. Patients identified healthy coping skills they would like to try during hospitalization and after discharge to manage stress in a healthy way. CSW offered insight to varying stress management techniques.   Breslin Hemann [address], [place] 09/15/2016, 10:33 AM

## 2016-09-15 NOTE — Progress Notes (Signed)
1:1 Note: Pt at this time is in bed resting with eyes closed. Pt does not look to be in any distress at this time. 1:1 staff is present in room with Pt at this time. 1:1 monitoring continues for Pt's safety. 15-minute safety checks also continues at this time. 

## 2016-09-15 NOTE — Progress Notes (Signed)
1:1 Note  Pt at this time is alert and oriented x 4. Lurline Idol site looks clean and dry. Pt denied SI, HI, AVH, pain, depression or anxiety; states, "I feel great right now; wasn't feel too good earlier but I just got a visit from my family, I feel great" Pt does not look to be in any distress at this time. 1:1 staff is present in room with. Medications offered as prescribed. All patient's questions and concerns addressed. Support, encouragement, and safe environment provided. 1:1 monitoring continues for Pt's safety. 15-minute safety checks also continues at this time. Pt remained calm and cooperative.

## 2016-09-15 NOTE — Progress Notes (Signed)
1:1 Note  Pt at this time is sitting in bed interacting with 1:1 staff. Pt does not look to be in any acute distress. 1:1 monitoring continues for Pt's safety. 15-minute safety checks also continues at this time.

## 2016-09-15 NOTE — Plan of Care (Signed)
Problem: [facility] Participation in Recreation Therapeutic Interventions Goal: STG-Patient will demonstrate improved self esteem by identif STG: Self-Esteem - Within 4 treatment sessions, patient will verbalize at least 5 positive affirmation statements in each of 2 treatment sessions to increase self-esteem.  Outcome: Progressing Treatment Session 1; Completed 1 out of 2: At approximately 2:45 pm, LRT met with patient in patient room. Patient verbalized 5 positive affirmation statements. Patient reported it felt "good". LRT encouraged patient to continue saying positive affirmation statements.  Leonette Monarch, LRT/CTRS 05.11.18 3:24 pm Goal: STG-Other Recreation Therapy Goal (Specify) STG: Stress Management - Within 4 treatment sessions, patient will verbalize understanding of the stress management techniques in each of 2 treatment sessions to increase stress management skills.  Outcome: Progressing Treatment Session 1; Completed 1 out of 2: At approximately 2:45 pm, LRT met with patient in patient room. LRT educated and provided patient with handouts on stress management techniques. Patient verbalized understanding. LRT encouraged patient to read over and practice the stress management techniques.  Leonette Monarch, LRT/CTRS 05.11.18 3:25 pm

## 2016-09-15 NOTE — Progress Notes (Signed)
[facility] MD Progress Note  09/15/2016 1:44 PM [name]  MRN:  [id number]  Subjective:  [name] is a 56 year old male with a history of depression and anxiety admitted for suicidal thoughts, auditory hallucinations, severe anxiety, and paninsomnia. Started Seroquel for sleep, Minipress for PTSD and Luvox 50 mg.  09/15/2016. Mr. [name] met with treatment team. He feels somewhat better as he was able to sleep with Seroquel. He had to take additional 50 mg last night. He is not too drowsy. He did not have any side effects from Minipress either. Vital signs are stable. He is on Lantus and Novolog now. There were no behavioral or safety problems and we were able to discontinue sitter. Good program participation.   09/16/16 Patient reports doing a little bit better. He did not sleep very well last night and have multiple awakenings throughout the night. He denies suicidality, homicidality or auditory or visual hallucinations. He is very worried about his glucose as it has been running highs since the insulin pump was discontinued. He denies side effects from medications or any physical complaints  Per nursing: D: Pt denies SI/HI/AVH. Pt is pleasant and cooperative. Pt visible on the milieu talking with peers, pt appears to be in good spirits.   A: Pt was offered support and encouragement. Pt was given scheduled medications. Pt was encourage to attend groups. Q 15 minute checks were done for safety.   R:Pt attends groups and interacts well with peers and staff. Pt is taking medication. Pt has no complaints at this time .Pt receptive to treatment and safety maintained on unit.  Principal Problem: Major depressive disorder, recurrent severe without psychotic features ([place]) Diagnosis:   Patient Active Problem List   Diagnosis Date Noted  . Type 1 diabetes mellitus ([place]) [E10.9] 09/14/2016  . Major depressive disorder, recurrent severe without psychotic features ([place]) [F33.2] 09/13/2016  .  Hypothyroidism [E03.9] 09/13/2016  . HTN (hypertension) [I10] 09/13/2016  . Dyslipidemia [E78.5] 09/13/2016  . GERD (gastroesophageal reflux disease) [K21.9] 09/13/2016   Total Time spent with patient: 30 minutes  Past Psychiatric History: Depression, anxiety.  Past Medical History:  Past Medical History:  Diagnosis Date  . Depression   . Diabetes (HCC)    Insulin Pump  . Diabetes mellitus without complication ([place])   . GERD (gastroesophageal reflux disease)   . H/O laryngectomy   . Hyperlipidemia   . Hypertension   . Suicide attempt [facility]) 2014   damaged larynx - tracheostomy  . Thyroid disease     Past Surgical History:  Procedure Laterality Date  . LARYNGECTOMY    . TRACHEOSTOMY  2014   from [place] attempt   Family History: History reviewed. No pertinent family history. Family Psychiatric  History: See H&P. Social History:  History  Alcohol Use No    Comment: Pt denied; BAC not available     History  Drug Use No    Comment: Pt denied; UDS not available    Social History   Social History  . Marital status: Single    Spouse name: N/A  . Number of children: N/A  . Years of education: N/A   Social History Main Topics  . Smoking status: Former Smoker    Packs/day: 0.00    Types: Cigarettes    Quit date: 04/09/2013  . Smokeless tobacco: Never Used  . Alcohol use No     Comment: Pt denied; BAC not available  . Drug use: No     Comment: Pt denied; UDS  not available  . Sexual activity: No   Other Topics Concern  . None   Social History Narrative  . None     Current Medications: Current Facility-Administered Medications  Medication Dose Route Frequency Provider Last Rate Last Dose  . acetaminophen (TYLENOL) tablet 650 mg  650 mg Oral Q6H PRN Pucilowska, Jolanta B, MD   650 mg at 09/14/16 1746  . alum & mag hydroxide-simeth (MAALOX/MYLANTA) 200-200-20 MG/5ML suspension 30 mL  30 mL Oral Q4H PRN Pucilowska, Jolanta B, MD      . amLODipine (NORVASC) tablet 5  mg  5 mg Oral Daily Pucilowska, Jolanta B, MD   5 mg at 09/15/16 0833  . aspirin EC tablet 81 mg  81 mg Oral Daily Pucilowska, Jolanta B, MD   81 mg at 09/15/16 0833  . escitalopram (LEXAPRO) tablet 20 mg  20 mg Oral Daily Pucilowska, Jolanta B, MD   20 mg at 09/15/16 0833  . fluticasone (FLONASE) 50 MCG/ACT nasal spray 2 spray  2 spray Each Nare Daily Pucilowska, Jolanta B, MD   2 spray at 09/15/16 0830  . fluvoxaMINE (LUVOX) tablet 50 mg  50 mg Oral QHS Pucilowska, Jolanta B, MD      . hydrochlorothiazide (MICROZIDE) capsule 12.5 mg  12.5 mg Oral Daily Pucilowska, Jolanta B, MD   12.5 mg at 09/15/16 0833  . insulin aspart (novoLOG) injection 0-5 Units  0-5 Units Subcutaneous QHS Pucilowska, Jolanta B, MD   2 Units at 09/14/16 2214  . insulin aspart (novoLOG) injection 0-9 Units  0-9 Units Subcutaneous TID WC Pucilowska, Jolanta B, MD      . insulin aspart (novoLOG) injection 7 Units  7 Units Subcutaneous TID WC Dustin Flock, MD   7 Units at 09/15/16 1141  . insulin glargine (LANTUS) injection 30 Units  30 Units Subcutaneous QHS Dustin Flock, MD   30 Units at 09/14/16 2213  . levothyroxine (SYNTHROID, LEVOTHROID) tablet 75 mcg  75 mcg Oral QAC breakfast Pucilowska, Jolanta B, MD   75 mcg at 09/15/16 0659  . loratadine (CLARITIN) tablet 10 mg  10 mg Oral Daily Pucilowska, Jolanta B, MD   10 mg at 09/15/16 0833  . losartan (COZAAR) tablet 100 mg  100 mg Oral Daily Pucilowska, Jolanta B, MD   100 mg at 09/15/16 0833  . pantoprazole (PROTONIX) EC tablet 40 mg  40 mg Oral BID Pucilowska, Jolanta B, MD   40 mg at 09/15/16 0833  . prazosin (MINIPRESS) capsule 1 mg  1 mg Oral BID Pucilowska, Jolanta B, MD   1 mg at 09/15/16 1140  . QUEtiapine (SEROQUEL) tablet 100 mg  100 mg Oral QHS Pucilowska, Jolanta B, MD      . simvastatin (ZOCOR) tablet 20 mg  20 mg Oral Daily Pucilowska, Jolanta B, MD   20 mg at 09/15/16 0831   Facility-Administered Medications Ordered in Other Encounters  Medication Dose  Route Frequency Provider Last Rate Last Dose  . acetaminophen (TYLENOL) tablet 650 mg  650 mg Oral Q6H PRN Pucilowska, Jolanta B, MD      . alum & mag hydroxide-simeth (MAALOX/MYLANTA) 200-200-20 MG/5ML suspension 30 mL  30 mL Oral Q4H PRN Pucilowska, Jolanta B, MD      . fluticasone (FLONASE) 50 MCG/ACT nasal spray 2 spray  2 spray Each Nare Daily Pucilowska, Jolanta B, MD      . loratadine (CLARITIN) tablet 10 mg  10 mg Oral Daily Pucilowska, Jolanta B, MD      . magnesium hydroxide (  MILK OF MAGNESIA) suspension 30 mL  30 mL Oral Daily PRN Pucilowska, Jolanta B, MD        Lab Results:  Results for orders placed or performed during the hospital encounter of 09/14/16 (from the past 48 hour(s))  Glucose, capillary     Status: Abnormal   Collection Time: 09/14/16 11:54 AM  Result Value Ref Range   Glucose-Capillary 242 (H) 65 - 99 mg/dL  Glucose, capillary     Status: Abnormal   Collection Time: 09/14/16  1:28 PM  Result Value Ref Range   Glucose-Capillary 304 (H) 65 - 99 mg/dL  Glucose, capillary     Status: Abnormal   Collection Time: 09/14/16  4:29 PM  Result Value Ref Range   Glucose-Capillary 288 (H) 65 - 99 mg/dL   Comment 1 Notify RN   Glucose, capillary     Status: Abnormal   Collection Time: 09/14/16  8:35 PM  Result Value Ref Range   Glucose-Capillary 238 (H) 65 - 99 mg/dL  Glucose, capillary     Status: Abnormal   Collection Time: 09/15/16  6:41 AM  Result Value Ref Range   Glucose-Capillary 137 (H) 65 - 99 mg/dL  Lipid panel     Status: Abnormal   Collection Time: 09/15/16  7:11 AM  Result Value Ref Range   Cholesterol 161 0 - 200 mg/dL   Triglycerides 190 (H) <150 mg/dL   HDL 41 >40 mg/dL   Total CHOL/HDL Ratio 3.9 RATIO   VLDL 38 0 - 40 mg/dL   LDL Cholesterol 82 0 - 99 mg/dL    Comment:        Total Cholesterol/HDL:CHD Risk Coronary Heart Disease Risk Table                     Men   Women  1/2 Average Risk   3.4   3.3  Average Risk       5.0   4.4  2 X  Average Risk   9.6   7.1  3 X Average Risk  23.4   11.0        Use the calculated Patient Ratio above and the CHD Risk Table to determine the patient's CHD Risk.        ATP III CLASSIFICATION (LDL):  <100     mg/dL   Optimal  100-129  mg/dL   Near or Above                    Optimal  130-159  mg/dL   Borderline  160-189  mg/dL   High  >190     mg/dL   Very High   TSH     Status: None   Collection Time: 09/15/16  7:11 AM  Result Value Ref Range   TSH 1.952 0.350 - 4.500 uIU/mL    Comment: Performed by a 3rd Generation assay with a functional sensitivity of <=0.01 uIU/mL.  Glucose, capillary     Status: Abnormal   Collection Time: 09/15/16 11:24 AM  Result Value Ref Range   Glucose-Capillary 380 (H) 65 - 99 mg/dL    Blood Alcohol level:  Lab Results  Component Value Date   ETH <5 09/11/2016   ETH <5 62/94/7654    Metabolic Disorder Labs: No results found for: HGBA1C, MPG No results found for: PROLACTIN Lab Results  Component Value Date   CHOL 161 09/15/2016   TRIG 190 (H) 09/15/2016   HDL 41 09/15/2016   CHOLHDL  3.9 09/15/2016   VLDL 38 09/15/2016   LDLCALC 82 09/15/2016    Physical Findings: AIMS:  , ,  ,  ,    CIWA:    COWS:     Musculoskeletal: Strength & Muscle Tone: within normal limits Gait & Station: normal Patient leans: N/A  Psychiatric Specialty Exam: Physical Exam  Nursing note and vitals reviewed. Constitutional: He is oriented to person, place, and time. He appears well-developed and well-nourished.  HENT:  Head: Normocephalic and atraumatic.  Eyes: Conjunctivae and EOM are normal.  Neck: Normal range of motion.  Respiratory: Effort normal.  Musculoskeletal: Normal range of motion.  Neurological: He is alert and oriented to person, place, and time.  Psychiatric: His speech is normal. Thought content normal. Cognition and memory are normal.    Review of Systems  Constitutional: Negative.   HENT: Negative.   Eyes: Negative.    Respiratory: Negative.   Cardiovascular: Negative.   Gastrointestinal: Negative.   Genitourinary: Negative.   Musculoskeletal: Negative.   Skin: Negative.   Neurological: Negative.   Endo/Heme/Allergies: Negative.   Psychiatric/Behavioral: Positive for depression. Negative for hallucinations, memory loss, substance abuse and suicidal ideas. The patient is not nervous/anxious and does not have insomnia.   All other systems reviewed and are negative.   Blood pressure 132/83, pulse 70, temperature 98.2 F (36.8 C), temperature source Oral, resp. rate 12, SpO2 99 %.There is no height or weight on file to calculate BMI.  General Appearance: Casual  Eye Contact:  Good  Speech:  Clear and Coherent  Volume:  Normal  Mood:  Anxious  Affect:  Congruent  Thought Process:  Goal Directed and Descriptions of Associations: Intact  Orientation:  Full (Time, Place, and Person)  Thought Content:  WDL  Suicidal Thoughts:  No  Homicidal Thoughts:  No  Memory:  Immediate;   Fair Recent;   Fair Remote;   Fair  Judgement:  Fair  Insight:  Fair  Psychomotor Activity:  Normal  Concentration:  Concentration: Fair and Attention Span: Fair  Recall:  [employer] of Knowledge:  Fair  Language:  Fair  Akathisia:  No  Handed:  Right  AIMS (if indicated):     Assets:  Communication Skills Desire for Improvement Financial Resources/Insurance Housing Resilience Social Support Transportation  ADL's:  Intact  Cognition:  WNL  Sleep:        Treatment Plan Summary: Daily contact with patient to assess and evaluate symptoms and progress in treatment and Medication management   Mr. Killgore is a 56 year old male with a history of depression and anxiety admitted for worsening depression, suicidal ideation and auditory command hallucinations.  1. Suicidal ideation. The patient is able to contract for safety in the hospital.  2. Mood. We continue Lexapro for depression. We discontnued Depakote as the  patient refuses to take it. He claims that auditory hallucinations have resolved. We will give Luvox 50 mg. If he is able to tolerate it, will titrate it.  3. Type I diabetes. The patient uses insulin pump at home. Pump was discontinued at [facility]. Medicine input and input from Diabetes Nursse Coordinator is greatly appreciated. I spoke with Gentry Fitz about recommendation made by Dr. Eddie Dibbles, endocrinologist,  and made Dr. Posey Pronto aware. The patient is on on Lantus, Novolog tid, ADA diet, SSI, CBG monitoing.  4. PTSD. He tolerated a single dose of 1 mg Minipress well. Minipress 1 mg twice daily for nightmares and flashbacks.   5. Insomnia. The patient did not  respond to Trazodone, Remeron or Ambien. The only medication he remembers put him to sleep was Seroquel. He reports good sleep with 100 mg Seroquel. I will keep Seroquel for now. I am aware of his diabetes but the patient has not slept in days.  6. Dyslipidemia. He is on Zocor.  7. HTN. He is on ASA, HCTZ, Norvasc and Cozaar.  8. Hypothyroidism. He is on Synthroid.  9. GERD. He is on Protonix.  10. Metabolic syndrome monitoring. Lipid panel shows slightly elevated triglycerides, normal TSH. HgbA1C is pending.  11. EKG. Was ordered but still pending.  12. Disposition. He will be discharged to home. He will follow up with a new psychiatrist.   09/16/16 Patient was hypoglycemic this morning. He is glucose has been erratic as insulin pump has been discontinued. The patient is being follow-up by hospitalist service. Other than that the patient appears to be stable. I will increase Seroquel tonight to 200 mg daily at bedtime as he did not sleep well last night. To prevent falls due to orthostatic hypotension I will discontinue Minipress for now   Hildred Priest, MD 09/15/2016, 1:44 PM

## 2016-09-15 NOTE — Progress Notes (Addendum)
[facility] [place] NAME: [name]    MR#:  [id number]  DATE OF BIRTH:  1961/04/23  SUBJECTIVE:   Patient admitted to behavioral medicine due to depresson with suicidal ideations and hospitalist servicere consulted for diabetes management. Patient is on a insulin pump but currently getting Lantus with NovoLog with meals and sliding scale coverage.  No complains presently.  BS Stable.   REVIEW OF SYSTEMS:    Review of Systems  Constitutional: Negative for chills and fever.  HENT: Negative for congestion and tinnitus.   Eyes: Negative for blurred vision and double vision.  Respiratory: Negative for cough, shortness of breath and wheezing.   Cardiovascular: Negative for chest pain, orthopnea and PND.  Gastrointestinal: Negative for abdominal pain, diarrhea, nausea and vomiting.  Genitourinary: Negative for dysuria and hematuria.  Neurological: Negative for dizziness, sensory change and focal weakness.  All other systems reviewed and are negative.   Nutrition: Carb control Tolerating Diet: Yes Tolerating PT: Ambulatory  DRUG ALLERGIES:   Allergies  Allergen Reactions  . Buspar [Buspirone]     Makes the patient "flip out"  . Depakote [Valproic Acid]     Causes excessive drowsiness  . Clopidogrel Rash  . Gabapentin Itching and Rash    VITALS:  Blood pressure 132/83, pulse 70, temperature 98.2 F (36.8 C), temperature source Oral, resp. rate 12, SpO2 99 %.  PHYSICAL EXAMINATION:   Physical Exam  GENERAL:  56 y.o.-year-old obese patient sitting up in bed in no acute distress.  EYES: Pupils equal, round, reactive to light and accommodation. No scleral icterus. Extraocular muscles intact.  HEENT: Head atraumatic, normocephalic. Oropharynx and nasopharynx clear.  NECK:  Supple, no jugular venous distention. No thyroid enlargement, no tenderness.  LUNGS: Normal breath sounds bilaterally, no wheezing, rales, rhonchi. No use of  accessory muscles of respiration.  CARDIOVASCULAR: S1, S2 normal. No murmurs, rubs, or gallops.  ABDOMEN: Soft, nontender, nondistended. Bowel sounds present. No organomegaly or mass.  EXTREMITIES: No cyanosis, clubbing or edema b/l.    NEUROLOGIC: Cranial nerves II through XII are intact. No focal Motor or sensory deficits b/l.   PSYCHIATRIC: The patient is alert and oriented x 3.  SKIN: No obvious rash, lesion, or ulcer.    LABORATORY PANEL:   CBC  Recent Labs Lab 09/11/16 1851  WBC 10.2  HGB 16.5  HCT 47.6  PLT 315   ------------------------------------------------------------------------------------------------------------------  Chemistries   Recent Labs Lab 09/11/16 1851  NA 136  K 3.9  CL 103  CO2 21*  GLUCOSE 195*  BUN 24*  CREATININE 1.74*  CALCIUM 9.5  AST 18  ALT 17  ALKPHOS 92  BILITOT 0.8   ------------------------------------------------------------------------------------------------------------------  Cardiac Enzymes No results for input(s): TROPONINI in the last 168 hours. ------------------------------------------------------------------------------------------------------------------  RADIOLOGY:  No results found.   ASSESSMENT AND PLAN:   56 year old male with past medical hx of diabetes, hypertension, depression,hyperlipidemia, GERD, hypothyroidismwho presented to the hospital admitted to behavioral medicine due to hallucinations and suicidal ideations.  1. Diabetes type 2 without complication-patient is on insulin pump at home.This cannot be used in behavioral medicine. -Continue Lantus, we'll advance NovoLog with meals, continue sliding scale insulin. Appreciate diabetes coronary input.  2. Essential hypertension-continue losartan, HCTZ, amlodipine.  3. Hyperlipidemia-continue Zocor.  4. Hypothyroidism-continue Synthroid.  5. Depression with suicidal ideations- cont. Luvox, Lexapro - cont. Further care as per Psychiatry  6. BPH  - cont. Prazosin.      All the records are reviewed and  case discussed with Care Management/Social Worker. Management plans discussed with the patient, family and they are in agreement.  CODE STATUS: Full code  DVT Prophylaxis: Ambulatory  TOTAL TIME TAKING CARE OF THIS PATIENT: 25 minutes.   POSSIBLE D/C unclear and depends on behavioral medicine.   Henreitta Leber M.D on 09/15/2016 at 2:19 PM  Between 7am to 6pm - Pager - [phone]  After 6pm go to [email or web address] - Proofreader  [facility]  Office  [phone]  CC: Primary care physician; Derinda Late, MD

## 2016-09-15 NOTE — Plan of Care (Signed)
Problem: Safety: Goal: Periods of time without injury will increase Outcome: Progressing Pt safe on the unit at this time   

## 2016-09-15 NOTE — Tx Team (Signed)
Interdisciplinary Treatment and Diagnostic Plan Update  09/15/2016 Time of Session: [place] MRN: [id number]  Principal Diagnosis: Major depressive disorder, recurrent severe without psychotic features ([place])  Secondary Diagnoses: Principal Problem:   Major depressive disorder, recurrent severe without psychotic features ([place]) Active Problems:   Hypothyroidism   HTN (hypertension)   Dyslipidemia   GERD (gastroesophageal reflux disease)   Type 1 diabetes mellitus ([place])   Current Medications:  Current Facility-Administered Medications  Medication Dose Route Frequency Provider Last Rate Last Dose  . acetaminophen (TYLENOL) tablet 650 mg  650 mg Oral Q6H PRN Pucilowska, Jolanta B, MD   650 mg at 09/14/16 1746  . alum & mag hydroxide-simeth (MAALOX/MYLANTA) 200-200-20 MG/5ML suspension 30 mL  30 mL Oral Q4H PRN Pucilowska, Jolanta B, MD      . amLODipine (NORVASC) tablet 5 mg  5 mg Oral Daily Pucilowska, Jolanta B, MD   5 mg at 09/15/16 0833  . aspirin EC tablet 81 mg  81 mg Oral Daily Pucilowska, Jolanta B, MD   81 mg at 09/15/16 0833  . escitalopram (LEXAPRO) tablet 20 mg  20 mg Oral Daily Pucilowska, Jolanta B, MD   20 mg at 09/15/16 0833  . fluticasone (FLONASE) 50 MCG/ACT nasal spray 2 spray  2 spray Each Nare Daily Pucilowska, Jolanta B, MD   2 spray at 09/15/16 0830  . fluvoxaMINE (LUVOX) tablet 50 mg  50 mg Oral QHS Pucilowska, Jolanta B, MD      . hydrochlorothiazide (MICROZIDE) capsule 12.5 mg  12.5 mg Oral Daily Pucilowska, Jolanta B, MD   12.5 mg at 09/15/16 0833  . insulin aspart (novoLOG) injection 0-5 Units  0-5 Units Subcutaneous QHS Pucilowska, Jolanta B, MD   2 Units at 09/14/16 2214  . insulin aspart (novoLOG) injection 0-9 Units  0-9 Units Subcutaneous TID WC Pucilowska, Jolanta B, MD      . insulin aspart (novoLOG) injection 9 Units  9 Units Subcutaneous TID WC Sainani, Vivek J, MD      . insulin glargine (LANTUS) injection 30 Units  30 Units Subcutaneous  QHS Dustin Flock, MD   30 Units at 09/14/16 2213  . levothyroxine (SYNTHROID, LEVOTHROID) tablet 75 mcg  75 mcg Oral QAC breakfast Pucilowska, Jolanta B, MD   75 mcg at 09/15/16 0659  . loratadine (CLARITIN) tablet 10 mg  10 mg Oral Daily Pucilowska, Jolanta B, MD   10 mg at 09/15/16 0833  . losartan (COZAAR) tablet 100 mg  100 mg Oral Daily Pucilowska, Jolanta B, MD   100 mg at 09/15/16 0833  . pantoprazole (PROTONIX) EC tablet 40 mg  40 mg Oral BID Pucilowska, Jolanta B, MD   40 mg at 09/15/16 0833  . prazosin (MINIPRESS) capsule 1 mg  1 mg Oral BID Pucilowska, Jolanta B, MD   1 mg at 09/15/16 1140  . QUEtiapine (SEROQUEL) tablet 100 mg  100 mg Oral QHS Pucilowska, Jolanta B, MD      . simvastatin (ZOCOR) tablet 20 mg  20 mg Oral Daily Pucilowska, Jolanta B, MD   20 mg at 09/15/16 0831   Facility-Administered Medications Ordered in Other Encounters  Medication Dose Route Frequency Provider Last Rate Last Dose  . acetaminophen (TYLENOL) tablet 650 mg  650 mg Oral Q6H PRN Pucilowska, Jolanta B, MD      . alum & mag hydroxide-simeth (MAALOX/MYLANTA) 200-200-20 MG/5ML suspension 30 mL  30 mL Oral Q4H PRN Pucilowska, Jolanta B, MD      . fluticasone (FLONASE) 50  MCG/ACT nasal spray 2 spray  2 spray Each Nare Daily Pucilowska, Jolanta B, MD      . loratadine (CLARITIN) tablet 10 mg  10 mg Oral Daily Pucilowska, Jolanta B, MD      . magnesium hydroxide (MILK OF MAGNESIA) suspension 30 mL  30 mL Oral Daily PRN Pucilowska, Jolanta B, MD       PTA Medications: Prescriptions Prior to Admission  Medication Sig Dispense Refill Last Dose  . amLODipine (NORVASC) 5 MG tablet Take 5 mg by mouth daily.   09/11/2016  . aspirin EC 81 MG tablet Take 1 tablet by mouth daily.   09/11/2016  . divalproex (DEPAKOTE ER) 500 MG 24 hr tablet Take 500 mg by mouth daily.   09/10/2016  . escitalopram (LEXAPRO) 20 MG tablet Take 20 mg by mouth daily.   09/11/2016  . Fexofenadine HCl (ALLEGRA PO) Take 180 mg by mouth daily.     09/12/2015  . fluticasone (FLONASE) 50 MCG/ACT nasal spray Place 2 sprays into both nostrils daily.   09/11/2016  . hydrochlorothiazide (HYDRODIURIL) 12.5 MG tablet Take 12.5 mg by mouth daily.   09/11/2016  . insulin aspart (NOVOLOG) 100 UNIT/ML injection Inject 1.15-1.25 Units into the skin See admin instructions. Via insulin pump-basal rate: 1.15-1.25 units/hr (every 8 carbs = 1 unit of insulin bolus) 0000-0800 = 1.15 units/hr and 0801/2359 = 1.25 units/hr   Continuous at Continuous  . levothyroxine (SYNTHROID, LEVOTHROID) 75 MCG tablet Take 75 mcg by mouth daily before breakfast.   09/11/2016  . losartan (COZAAR) 100 MG tablet Take 1 tablet by mouth daily.   09/11/2016  . pantoprazole (PROTONIX) 40 MG tablet Take 1 tablet by mouth 2 (two) times daily.   09/11/2016  . simvastatin (ZOCOR) 20 MG tablet Take 1 tablet by mouth daily.   09/10/2016  . [DISCONTINUED] DiphenhydrAMINE HCl (ZZZQUIL) 50 MG/30ML LIQD Take 50 mg by mouth at bedtime as needed (for sleep).    PRN at PRN  . [DISCONTINUED] Diphenhydramine-APAP, sleep, (TYLENOL PM EXTRA STRENGTH PO) Take 1 tablet by mouth at bedtime as needed (for sleep).    PRN at PRN    Patient Stressors: Health problems Occupational concerns  Patient Strengths: Average or above average intelligence Capable of independent living Supportive family/friends  Treatment Modalities: Medication Management, Group therapy, Case management,  1 to 1 session with clinician, Psychoeducation, Recreational therapy.   Physician Treatment Plan for Primary Diagnosis: Major depressive disorder, recurrent severe without psychotic features ([place]) Long Term Goal(s): Improvement in symptoms so as ready for discharge NA   Short Term Goals: Ability to identify changes in lifestyle to reduce recurrence of condition will improve Ability to verbalize feelings will improve Ability to disclose and discuss suicidal ideas Ability to demonstrate self-control will improve Ability to identify and  develop effective coping behaviors will improve Compliance with prescribed medications will improve Ability to identify triggers associated with substance abuse/mental health issues will improve NA  Medication Management: Evaluate patient's response, side effects, and tolerance of medication regimen.  Therapeutic Interventions: 1 to 1 sessions, Unit Group sessions and Medication administration.  Evaluation of Outcomes: Progressing  Physician Treatment Plan for Secondary Diagnosis: Principal Problem:   Major depressive disorder, recurrent severe without psychotic features ([place]) Active Problems:   Hypothyroidism   HTN (hypertension)   Dyslipidemia   GERD (gastroesophageal reflux disease)   Type 1 diabetes mellitus ([place])  Long Term Goal(s): Improvement in symptoms so as ready for discharge NA   Short Term Goals: Ability to identify  changes in lifestyle to reduce recurrence of condition will improve Ability to verbalize feelings will improve Ability to disclose and discuss suicidal ideas Ability to demonstrate self-control will improve Ability to identify and develop effective coping behaviors will improve Compliance with prescribed medications will improve Ability to identify triggers associated with substance abuse/mental health issues will improve NA     Medication Management: Evaluate patient's response, side effects, and tolerance of medication regimen.  Therapeutic Interventions: 1 to 1 sessions, Unit Group sessions and Medication administration.  Evaluation of Outcomes: Progressing   RN Treatment Plan for Primary Diagnosis: Major depressive disorder, recurrent severe without psychotic features ([place]) Long Term Goal(s): Knowledge of disease and therapeutic regimen to maintain health will improve  Short Term Goals: Ability to remain free from injury will improve, Ability to verbalize feelings will improve, Ability to disclose and discuss suicidal ideas, Ability to identify  and develop effective coping behaviors will improve and Compliance with prescribed medications will improve  Medication Management: RN will administer medications as ordered by provider, will assess and evaluate patient's response and provide education to patient for prescribed medication. RN will report any adverse and/or side effects to prescribing provider.  Therapeutic Interventions: 1 on 1 counseling sessions, Psychoeducation, Medication administration, Evaluate responses to treatment, Monitor vital signs and CBGs as ordered, Perform/monitor CIWA, COWS, AIMS and Fall Risk screenings as ordered, Perform wound care treatments as ordered.  Evaluation of Outcomes: Progressing   LCSW Treatment Plan for Primary Diagnosis: Major depressive disorder, recurrent severe without psychotic features ([place]) Long Term Goal(s): Safe transition to appropriate next level of care at discharge, Engage patient in therapeutic group addressing interpersonal concerns.  Short Term Goals: Engage patient in aftercare planning with referrals and resources and Increase skills for wellness and recovery  Therapeutic Interventions: Assess for all discharge needs, 1 to 1 time with Social worker, Explore available resources and support systems, Assess for adequacy in community support network, Educate family and significant other(s) on suicide prevention, Complete Psychosocial Assessment, Interpersonal group therapy.  Evaluation of Outcomes: Progressing    Recreational Therapy Treatment Plan for Primary Diagnosis: Major depressive disorder, recurrent severe without psychotic features ([place]) Long Term Goal(s): Patient will participate in recreation therapy treatment in at least 2 group sessions without prompting from LRT  Short Term Goals: Increase self-esteem, Increase stress management skills  Treatment Modalities: Group Therapy and Individual Treatment Sessions  Therapeutic Interventions: Psychoeducation  Evaluation  of Outcomes: Progressing   Progress in Treatment: Attending groups: Yes. Participating in groups: Yes. Taking medication as prescribed: Yes. Toleration medication: Yes. Family/Significant other contact made: Yes, individual(s) contacted:  mother Patient understands diagnosis: Yes. Discussing patient identified problems/goals with staff: Yes. Medical problems stabilized or resolved: Yes. Denies suicidal/homicidal ideation: Yes. Issues/concerns per patient self-inventory: No. Other: none  New problem(s) identified: No, Describe:  none  New Short Term/Long Term Goal(s): Pt goal is to find something that will keep me from falling back into depression.  Discharge Plan or Barriers: CSW assessing for appropriate plan.  Reason for Continuation of Hospitalization: Depression Medication stabilization  Estimated Length of Stay: 3-4 days.  ttendees: Patient: Batu Cassin 09/15/2016   Physician: Dr. Jerilee Hoh, MD 09/15/2016   Nursing: Elige Radon, RN 09/15/2016   RN Care Manager: 09/15/2016   Social Worker: Lurline Idol, LCSW 09/15/2016   Recreational Therapist: Drue Flirt, LRT/CTRS  09/15/2016   Other:  09/15/2016   Other:  09/15/2016   Other: 09/15/2016        Scribe for Treatment Team: Winferd Humphrey  Mortimer Fries 09/15/2016 3:23 PM

## 2016-09-15 NOTE — Progress Notes (Signed)
D: Pt denies SI/HI/AVH. Pt is pleasant and cooperative. Pt visible on the milieu talking with peers, pt appears to be in good spirits.   A: Pt was offered support and encouragement. Pt was given scheduled medications. Pt was encourage to attend groups. Q 15 minute checks were done for safety.   R:Pt attends groups and interacts well with peers and staff. Pt is taking medication. Pt has no complaints at this time .Pt receptive to treatment and safety maintained on unit.

## 2016-09-16 LAB — GLUCOSE, CAPILLARY
GLUCOSE-CAPILLARY: 53 mg/dL — AB (ref 65–99)
GLUCOSE-CAPILLARY: 56 mg/dL — AB (ref 65–99)
GLUCOSE-CAPILLARY: 187 mg/dL — AB (ref 65–99)
GLUCOSE-CAPILLARY: 81 mg/dL (ref 65–99)
Glucose-Capillary: 81 mg/dL (ref 65–99)
Glucose-Capillary: 248 mg/dL — ABNORMAL HIGH (ref 65–99)
Glucose-Capillary: 92 mg/dL (ref 65–99)
Glucose-Capillary: 51 mg/dL — ABNORMAL LOW (ref 65–99)

## 2016-09-16 LAB — HEMOGLOBIN A1C
Hgb A1c MFr Bld: 8.1 % — ABNORMAL HIGH (ref 4.8–5.6)
MEAN PLASMA GLUCOSE: 186 mg/dL

## 2016-09-16 MED ORDER — QUETIAPINE FUMARATE 200 MG PO TABS
200.0000 mg | ORAL_TABLET | Freq: Every day | ORAL | Status: DC
  Administered 2016-09-16: 200 mg via ORAL
  Filled 2016-09-16: qty 1

## 2016-09-16 MED ORDER — INSULIN GLARGINE 100 UNIT/ML ~~LOC~~ SOLN
22.0000 [IU] | Freq: Every day | SUBCUTANEOUS | Status: DC
  Administered 2016-09-17 – 2016-09-18 (×2): 22 [IU] via SUBCUTANEOUS
  Filled 2016-09-16 (×4): qty 0.22

## 2016-09-16 MED ORDER — INSULIN GLARGINE 100 UNIT/ML ~~LOC~~ SOLN
26.0000 [IU] | Freq: Every day | SUBCUTANEOUS | Status: DC
  Administered 2016-09-16: 26 [IU] via SUBCUTANEOUS
  Filled 2016-09-16: qty 0.26

## 2016-09-16 MED ORDER — INSULIN ASPART 100 UNIT/ML ~~LOC~~ SOLN
7.0000 [IU] | Freq: Three times a day (TID) | SUBCUTANEOUS | Status: DC
  Administered 2016-09-16 – 2016-09-18 (×5): 7 [IU] via SUBCUTANEOUS
  Filled 2016-09-16 (×3): qty 7

## 2016-09-16 NOTE — [facility] Group Notes (Signed)
House Group Notes:  (Nursing/MHT/Case Management/Adjunct)  Date:  09/16/2016  Time:  11:17 PM  Type of Therapy:  Group Therapy  Participation Level:  Active  Participation Quality:  Appropriate  Affect:  Appropriate  Cognitive:  Alert  Insight:  Appropriate  Engagement in Group:  Engaged  Modes of Intervention:  Activity  Summary of Progress/Problems:  [name] 09/16/2016, 11:17 PM

## 2016-09-16 NOTE — BHH Group Notes (Signed)
[facility] Group Notes:  (Nursing/MHT/Case Management/Adjunct)  Date:  09/16/2016  Time:  1:44 AM  Type of Therapy:  Psychoeducational Skills  Participation Level:  Active  Participation Quality:  Appropriate, Sharing and Supportive  Affect:  Appropriate  Cognitive:  Appropriate and Oriented  Insight:  Good  Engagement in Group:  Engaged  Modes of Intervention:  Exploration  Summary of Progress/Problems:  Kathi Ludwig 09/16/2016, 1:44 AM

## 2016-09-16 NOTE — Progress Notes (Signed)
Hypoglycemic Event  CBG: 56  Treatment: 15 GM carbohydrate snack  Symptoms: None  Follow-up CBG: Time: 723  CBG Result: 53  Possible Reasons for Event: Inadequate meal intake and Unknown  Comments/MD notified: continue to monitor     [name]

## 2016-09-16 NOTE — Progress Notes (Signed)
Hypoglycemic Event  CBG: 51  Treatment: Pt given OJ and graham crackers and peanut butter  Symptoms: none  Follow-up CBG: Time: 2115 CBG Result: 81  Possible Reasons for Event: Unknown MD aware  Comments/MD notified:Dr Bolivar Haw

## 2016-09-16 NOTE — Progress Notes (Addendum)
Hypoglycemic Event  CBG: 81  Treatment: 15 GM carbohydrate snack  Symptoms: None  Follow-up CBG: Time: 7:38 am CBG Result:na Possible Reasons for Event: Unknown  Comments/MD notified:This was follow up check from 15 prior    [place]

## 2016-09-16 NOTE — BHH Group Notes (Signed)
[facility] LCSW Group Therapy Note  Date/Time: 09/16/16, 1300  Type of Therapy/Topic:  Group Therapy:  Balance in Life  Participation Level:  active  Description of Group:    This group will address the concept of balance and how it feels and looks when one is unbalanced. Patients will be encouraged to process areas in their lives that are out of balance, and identify reasons for remaining unbalanced. Facilitators will guide patients utilizing problem- solving interventions to address and correct the stressor making their life unbalanced. Understanding and applying boundaries will be explored and addressed for obtaining  and maintaining a balanced life. Patients will be encouraged to explore ways to assertively make their unbalanced needs known to significant others in their lives, using other group members and facilitator for support and feedback.  Therapeutic Goals: 1. Patient will identify two or more emotions or situations they have that consume much of in their lives. 2. Patient will identify signs/triggers that life has become out of balance:  3. Patient will identify two ways to set boundaries in order to achieve balance in their lives:  4. Patient will demonstrate ability to communicate their needs through discussion and/or role plays  Summary of Patient Progress: Pt made a number of contributions to the discussion and had to be redirected from having side conversations several times.  Pt did discuss being unable to work due to disability and options for still using time productively.  Pt was attentive to the discussion.          Therapeutic Modalities:   Cognitive Behavioral Therapy Solution-Focused Therapy Assertiveness Training  Lurline Idol, [place]

## 2016-09-16 NOTE — Progress Notes (Signed)
Inpatient Diabetes Program Recommendations  AACE/ADA: New Consensus Statement on Inpatient Glycemic Control (2015)  Target Ranges:  Prepandial:   less than 140 mg/dL      Peak postprandial:   less than 180 mg/dL (1-2 hours)      Critically ill patients:  140 - 180 mg/dL   Results for DESTYN, SCHUYLER (MRN [id number]) as of 09/16/2016 14:08  Ref. Range 09/16/2016 07:08 09/16/2016 07:24 09/16/2016 07:41 09/16/2016 08:36 09/16/2016 11:12  Glucose-Capillary Latest Ref Range: 65 - 99 mg/dL 56 (L) 53 (L) 81 248 (H) 187 (H)    Home DM Meds: Insulin Pump  Current Insulin Orders: Lantus 26 units QHS      Novolog Sensitive Correction Scale/ SSI (0-9 units) TID AC + HS      Novolog 7 units TID with meals     MD- Note patient with Hypoglycemia this AM (CBG down to 56 mg/dl).  Note that Lantus reduced to 26 units QHS and Novolog Meal Coverage reduced to 7 units TID with meals.  Agree with above insulin adjustments.    --Will follow patient during hospitalization--  Wyn Quaker RN, MSN, CDE Diabetes Coordinator Inpatient Glycemic Control Team Team Pager: [phone] (8a-5p)

## 2016-09-16 NOTE — Plan of Care (Signed)
Problem: Coping: Goal: Ability to cope will improve Outcome: Progressing Working on coping skills   

## 2016-09-16 NOTE — Progress Notes (Signed)
D: Patient stated slept fairlast night .Stated appetite is good and energy level  low. Stated concentration poor. Stated on Depression scale 0 , hopeless 0 and anxiety 3  .( low 0-10 high) Denies suicidal  homicidal ideations  .  No auditory hallucinations  No pain concerns . Appropriate ADL'S. Interacting with peers and staff. Voice of feeling weak, noted  Out side sunbathing at one time  A: Encourage patient participation with unit programming . Instruction  Given on  Medication , verbalize understanding.attended unit programing  R: Voice no other concerns. Staff continue to monitor

## 2016-09-17 LAB — GLUCOSE, CAPILLARY
GLUCOSE-CAPILLARY: 130 mg/dL — AB (ref 65–99)
GLUCOSE-CAPILLARY: 81 mg/dL (ref 65–99)
GLUCOSE-CAPILLARY: 71 mg/dL (ref 65–99)
Glucose-Capillary: 280 mg/dL — ABNORMAL HIGH (ref 65–99)
Glucose-Capillary: 96 mg/dL (ref 65–99)

## 2016-09-17 MED ORDER — FLUVOXAMINE MALEATE 50 MG PO TABS
100.0000 mg | ORAL_TABLET | Freq: Every day | ORAL | Status: DC
  Administered 2016-09-17: 100 mg via ORAL
  Filled 2016-09-17: qty 2

## 2016-09-17 MED ORDER — QUETIAPINE FUMARATE 200 MG PO TABS
200.0000 mg | ORAL_TABLET | Freq: Every day | ORAL | Status: DC
  Administered 2016-09-17 – 2016-09-18 (×2): 200 mg via ORAL
  Filled 2016-09-17 (×2): qty 1

## 2016-09-17 NOTE — BHH Group Notes (Addendum)
[place] LCSW Group Therapy   09/17/2016 1 PM   Type of Therapy: Group Therapy   Participation Level: Active   Participation Quality: Attentive, Sharing and Supportive   Affect: Appropriate   Cognitive: Alert and Oriented   Insight: Developing/Improving and Engaged   Engagement in Therapy: Developing/Improving and Engaged   Modes of Intervention: Clarification, Confrontation, Discussion, Education, Exploration, Limit-setting, Orientation, Problem-solving, Rapport Building, Art therapist, Socialization and Support   Summary of Progress/Problems: The topic for group today was support or lack of support and how this affects recovery. This group focused on both positive and negative aspects of support or lack therof and allowed  group members to process ways to cope with negative emotions by discussing their coping mechanisms for a lack of support. Group members were asked to reflect on a time when their reaction to a lack of support led to a negative outcome and explored how using coping mechanisms to regulate their emotions had benefited them. Group members were also asked to discuss a time when emotion regulation was utilized when they felt overwhelmed.      Glorious Peach, MSW, LCSW-A 09/17/2016, 3:07PM

## 2016-09-17 NOTE — Progress Notes (Signed)
D: Pt denied SI, HI, and AVH. He's been calm and appropriate on the unit. CBG was 81 before lunch and 280 before dinner. On his self-inventory form, he rated his anxiety 5/10 but rated both depression and hopelessness zero/ten. He reported poor sleep, good appetite, normal energy level, and poor concentration.   A: Meds given as ordered. Q15 safety checks maintained. Support/encouragement offered.  R: Pt remains free from harm and continues with treatment. Will continue to monitor for needs/safety.

## 2016-09-17 NOTE — Progress Notes (Signed)
Patient was visited by Mother and he later stated visit went "OK".  Patient was outgoing and friendly with peers. He spent time in the dayroom and mood was calm. BG at 2055 was 51 without symptoms per patient and he ate snack.  After snack BG was 81.   He asked to have Trazodone and luvox "as late as possible" but did take his medications by 2210.  Patient went to bed at 2300.

## 2016-09-17 NOTE — Plan of Care (Signed)
Problem: Activity: Goal: Interest or engagement in leisure activities will improve Outcome: Progressing Patient engages with peers and behavior is appropriate.

## 2016-09-17 NOTE — Progress Notes (Signed)
Episodes of hypoglycemia.  Reduced dose  of Lantus to 22 units and Novolog to 7 units TID WM.

## 2016-09-17 NOTE — Progress Notes (Signed)
[facility] MD Progress Note  09/17/2016 12:35 PM [name]  MRN:  [id number]  Subjective:  [name] is a 56 year old male with a history of depression and anxiety admitted for suicidal thoughts, auditory hallucinations, severe anxiety, and paninsomnia. Started Seroquel for sleep, Minipress for PTSD and Luvox 50 mg.  09/15/2016. Mr. [name] met with treatment team. He feels somewhat better as he was able to sleep with Seroquel. He had to take additional 50 mg last night. He is not too drowsy. He did not have any side effects from Minipress either. Vital signs are stable. He is on Lantus and Novolog now. There were no behavioral or safety problems and we were able to discontinue sitter. Good program participation.   09/16/16 Patient reports doing a little bit better. He did not sleep very well last night and have multiple awakenings throughout the night. He denies suicidality, homicidality or auditory or visual hallucinations. He is very worried about his glucose as it has been running highs since the insulin pump was discontinued. He denies side effects from medications or any physical complaints  5/13 Patient says he is feeling a little bit better. He had some trouble falling asleep as he feels the medication kicking too late. Other than that he does not have any issues or concerns. Denies suicidality, homicidality or auditory or visual hallucinations. He has been eating well. His diabetes is better control. He was seen by internal medicine will change his insulin regimen this morning  Per nursing: Patient was visited by Mother and he later stated visit went "OK".  Patient was outgoing and friendly with peers. He spent time in the dayroom and mood was calm. BG at 2055 was 51 without symptoms per patient and he ate snack.  After snack BG was 81.   He asked to have Trazodone and luvox "as late as possible" but did take his medications by 2210.  Patient went to bed at 2300.    Principal Problem: Major  depressive disorder, recurrent severe without psychotic features ([place]) Diagnosis:   Patient Active Problem List   Diagnosis Date Noted  . Type 1 diabetes mellitus ([place]) [E10.9] 09/14/2016  . Major depressive disorder, recurrent severe without psychotic features ([place]) [F33.2] 09/13/2016  . Hypothyroidism [E03.9] 09/13/2016  . HTN (hypertension) [I10] 09/13/2016  . Dyslipidemia [E78.5] 09/13/2016  . GERD (gastroesophageal reflux disease) [K21.9] 09/13/2016   Total Time spent with patient: 30 minutes  Past Psychiatric History: Depression, anxiety.  Past Medical History:  Past Medical History:  Diagnosis Date  . Depression   . Diabetes (HCC)    Insulin Pump  . Diabetes mellitus without complication ([place])   . GERD (gastroesophageal reflux disease)   . H/O laryngectomy   . Hyperlipidemia   . Hypertension   . Suicide attempt [facility]) 2014   damaged larynx - tracheostomy  . Thyroid disease     Past Surgical History:  Procedure Laterality Date  . LARYNGECTOMY    . TRACHEOSTOMY  2014   from [place] attempt   Family History: History reviewed. No pertinent family history. Family Psychiatric  History: See H&P. Social History:  History  Alcohol Use No    Comment: Pt denied; BAC not available     History  Drug Use No    Comment: Pt denied; UDS not available    Social History   Social History  . Marital status: Single    Spouse name: N/A  . Number of children: N/A  . Years of education: N/A  Social History Main Topics  . Smoking status: Former Smoker    Packs/day: 0.00    Types: Cigarettes    Quit date: 04/09/2013  . Smokeless tobacco: Never Used  . Alcohol use No     Comment: Pt denied; BAC not available  . Drug use: No     Comment: Pt denied; UDS not available  . Sexual activity: No   Other Topics Concern  . None   Social History Narrative  . None     Current Medications: Current Facility-Administered Medications  Medication Dose Route Frequency Provider Last  Rate Last Dose  . acetaminophen (TYLENOL) tablet 650 mg  650 mg Oral Q6H PRN Pucilowska, Jolanta B, MD   650 mg at 09/15/16 2159  . alum & mag hydroxide-simeth (MAALOX/MYLANTA) 200-200-20 MG/5ML suspension 30 mL  30 mL Oral Q4H PRN Pucilowska, Jolanta B, MD      . amLODipine (NORVASC) tablet 5 mg  5 mg Oral Daily Pucilowska, Jolanta B, MD   5 mg at 09/17/16 0806  . aspirin EC tablet 81 mg  81 mg Oral Daily Pucilowska, Jolanta B, MD   81 mg at 09/17/16 0806  . escitalopram (LEXAPRO) tablet 20 mg  20 mg Oral Daily Pucilowska, Jolanta B, MD   20 mg at 09/17/16 0806  . fluticasone (FLONASE) 50 MCG/ACT nasal spray 2 spray  2 spray Each Nare Daily Pucilowska, Jolanta B, MD   2 spray at 09/17/16 0811  . fluvoxaMINE (LUVOX) tablet 50 mg  50 mg Oral QHS Pucilowska, Jolanta B, MD   50 mg at 09/16/16 2216  . hydrochlorothiazide (MICROZIDE) capsule 12.5 mg  12.5 mg Oral Daily Pucilowska, Jolanta B, MD   12.5 mg at 09/17/16 0806  . insulin aspart (novoLOG) injection 0-5 Units  0-5 Units Subcutaneous QHS Pucilowska, Jolanta B, MD   2 Units at 09/14/16 2214  . insulin aspart (novoLOG) injection 0-9 Units  0-9 Units Subcutaneous TID WC Pucilowska, Jolanta B, MD   1 Units at 09/17/16 0759  . insulin aspart (novoLOG) injection 7 Units  7 Units Subcutaneous TID WC Hillary Bow, MD   7 Units at 09/17/16 0800  . insulin glargine (LANTUS) injection 22 Units  22 Units Subcutaneous QHS Sudini, Srikar, MD      . levothyroxine (SYNTHROID, LEVOTHROID) tablet 75 mcg  75 mcg Oral QAC breakfast Pucilowska, Jolanta B, MD   75 mcg at 09/17/16 0649  . loratadine (CLARITIN) tablet 10 mg  10 mg Oral Daily Pucilowska, Jolanta B, MD   10 mg at 09/17/16 0806  . losartan (COZAAR) tablet 100 mg  100 mg Oral Daily Pucilowska, Jolanta B, MD   100 mg at 09/17/16 0806  . pantoprazole (PROTONIX) EC tablet 40 mg  40 mg Oral BID Pucilowska, Jolanta B, MD   40 mg at 09/17/16 0806  . QUEtiapine (SEROQUEL) tablet 200 mg  200 mg Oral QHS  Hildred Priest, MD      . simvastatin (ZOCOR) tablet 20 mg  20 mg Oral Daily Pucilowska, Jolanta B, MD   20 mg at 09/17/16 0805   Facility-Administered Medications Ordered in Other Encounters  Medication Dose Route Frequency Provider Last Rate Last Dose  . acetaminophen (TYLENOL) tablet 650 mg  650 mg Oral Q6H PRN Pucilowska, Jolanta B, MD      . alum & mag hydroxide-simeth (MAALOX/MYLANTA) 200-200-20 MG/5ML suspension 30 mL  30 mL Oral Q4H PRN Pucilowska, Jolanta B, MD      . fluticasone (FLONASE) 50 MCG/ACT nasal spray 2  spray  2 spray Each Nare Daily Pucilowska, Jolanta B, MD      . loratadine (CLARITIN) tablet 10 mg  10 mg Oral Daily Pucilowska, Jolanta B, MD      . magnesium hydroxide (MILK OF MAGNESIA) suspension 30 mL  30 mL Oral Daily PRN Pucilowska, Jolanta B, MD        Lab Results:  Results for orders placed or performed during the hospital encounter of 09/14/16 (from the past 48 hour(s))  Glucose, capillary     Status: Abnormal   Collection Time: 09/15/16  4:45 PM  Result Value Ref Range   Glucose-Capillary 152 (H) 65 - 99 mg/dL  Glucose, capillary     Status: Abnormal   Collection Time: 09/15/16  8:30 PM  Result Value Ref Range   Glucose-Capillary 123 (H) 65 - 99 mg/dL  Glucose, capillary     Status: Abnormal   Collection Time: 09/16/16  7:08 AM  Result Value Ref Range   Glucose-Capillary 56 (L) 65 - 99 mg/dL  Glucose, capillary     Status: Abnormal   Collection Time: 09/16/16  7:24 AM  Result Value Ref Range   Glucose-Capillary 53 (L) 65 - 99 mg/dL  Glucose, capillary     Status: None   Collection Time: 09/16/16  7:41 AM  Result Value Ref Range   Glucose-Capillary 81 65 - 99 mg/dL  Glucose, capillary     Status: Abnormal   Collection Time: 09/16/16  8:36 AM  Result Value Ref Range   Glucose-Capillary 248 (H) 65 - 99 mg/dL  Glucose, capillary     Status: Abnormal   Collection Time: 09/16/16 11:12 AM  Result Value Ref Range   Glucose-Capillary 187 (H)  65 - 99 mg/dL   Comment 1 Notify RN   Glucose, capillary     Status: None   Collection Time: 09/16/16  4:24 PM  Result Value Ref Range   Glucose-Capillary 92 65 - 99 mg/dL  Glucose, capillary     Status: Abnormal   Collection Time: 09/16/16  8:52 PM  Result Value Ref Range   Glucose-Capillary 51 (L) 65 - 99 mg/dL  Glucose, capillary     Status: None   Collection Time: 09/16/16  9:24 PM  Result Value Ref Range   Glucose-Capillary 81 65 - 99 mg/dL  Glucose, capillary     Status: Abnormal   Collection Time: 09/17/16  6:48 AM  Result Value Ref Range   Glucose-Capillary 130 (H) 65 - 99 mg/dL  Glucose, capillary     Status: None   Collection Time: 09/17/16 11:15 AM  Result Value Ref Range   Glucose-Capillary 81 65 - 99 mg/dL    Blood Alcohol level:  Lab Results  Component Value Date   ETH <5 09/11/2016   ETH <5 45/07/8880    Metabolic Disorder Labs: Lab Results  Component Value Date   HGBA1C 8.1 (H) 09/15/2016   MPG 186 09/15/2016   No results found for: PROLACTIN Lab Results  Component Value Date   CHOL 161 09/15/2016   TRIG 190 (H) 09/15/2016   HDL 41 09/15/2016   CHOLHDL 3.9 09/15/2016   VLDL 38 09/15/2016   LDLCALC 82 09/15/2016    Physical Findings: AIMS:  , ,  ,  ,    CIWA:    COWS:     Musculoskeletal: Strength & Muscle Tone: within normal limits Gait & Station: normal Patient leans: N/A  Psychiatric Specialty Exam: Physical Exam  Nursing note and vitals reviewed. Constitutional: He  is oriented to person, place, and time. He appears well-developed and well-nourished.  HENT:  Head: Normocephalic and atraumatic.  Eyes: Conjunctivae and EOM are normal.  Neck: Normal range of motion.  Respiratory: Effort normal.  Musculoskeletal: Normal range of motion.  Neurological: He is alert and oriented to person, place, and time.  Psychiatric: His speech is normal. Thought content normal. Cognition and memory are normal.    Review of Systems  Constitutional:  Negative.   HENT: Negative.   Eyes: Negative.   Respiratory: Negative.   Cardiovascular: Negative.   Gastrointestinal: Negative.   Genitourinary: Negative.   Musculoskeletal: Negative.   Skin: Negative.   Neurological: Negative.   Endo/Heme/Allergies: Negative.   Psychiatric/Behavioral: Positive for depression. Negative for hallucinations, memory loss, substance abuse and suicidal ideas. The patient is not nervous/anxious and does not have insomnia.   All other systems reviewed and are negative.   Blood pressure (!) 145/70, pulse 65, temperature 97.7 F (36.5 C), temperature source Oral, resp. rate 18, SpO2 99 %.There is no height or weight on file to calculate BMI.  General Appearance: Casual  Eye Contact:  Good  Speech:  Clear and Coherent  Volume:  Normal  Mood:  Anxious  Affect:  Congruent  Thought Process:  Goal Directed and Descriptions of Associations: Intact  Orientation:  Full (Time, Place, and Person)  Thought Content:  WDL  Suicidal Thoughts:  No  Homicidal Thoughts:  No  Memory:  Immediate;   Fair Recent;   Fair Remote;   Fair  Judgement:  Fair  Insight:  Fair  Psychomotor Activity:  Normal  Concentration:  Concentration: Fair and Attention Span: Fair  Recall:  [employer] of Knowledge:  Fair  Language:  Fair  Akathisia:  No  Handed:  Right  AIMS (if indicated):     Assets:  Communication Skills Desire for Improvement Financial Resources/Insurance Housing Resilience Social Support Transportation  ADL's:  Intact  Cognition:  WNL  Sleep:  Number of Hours: 3.3     Treatment Plan Summary: Daily contact with patient to assess and evaluate symptoms and progress in treatment and Medication management   Mr. Kitchings is a 56 year old male with a history of depression and anxiety admitted for worsening depression, suicidal ideation and auditory command hallucinations.  1. Suicidal ideation. The patient is able to contract for safety in the hospital.  2.  Mood. We continue Lexapro for depression. We discontnued Depakote as the patient refuses to take it. He claims that auditory hallucinations have resolved. We will give Luvox 50 mg. If he is able to tolerate it, will titrate it.  3. Type I diabetes. The patient uses insulin pump at home. Pump was discontinued at [facility] ER. Medicine input and input from Diabetes Nursse Coordinator is greatly appreciated. I spoke with Gentry Fitz about recommendation made by Dr. Eddie Dibbles, endocrinologist,  and made Dr. Posey Pronto aware. The patient is on on Lantus, Novolog tid, ADA diet, SSI, CBG monitoing.  4. PTSD. He tolerated a single dose of 1 mg Minipress well. Minipress 1 mg twice daily for nightmares and flashbacks.   5. Insomnia. The patient did not respond to Trazodone, Remeron or Ambien. The only medication he remembers put him to sleep was Seroquel. He reports good sleep with 100 mg Seroquel. I will keep Seroquel for now. I am aware of his diabetes but the patient has not slept in days.  6. Dyslipidemia. He is on Zocor.  7. HTN. He is on ASA, HCTZ, Norvasc and Cozaar.  8. Hypothyroidism. He is on Synthroid.  9. GERD. He is on Protonix.  10. Metabolic syndrome monitoring. Lipid panel shows slightly elevated triglycerides, normal TSH. HgbA1C is pending.  11. EKG. Was ordered but still pending.  12. Disposition. He will be discharged to home. He will follow up with a new psychiatrist.   09/16/16 Patient was hypoglycemic this morning. He is glucose has been erratic as insulin pump has been discontinued. The patient is being follow-up by hospitalist service. Other than that the patient appears to be stable. I will increase Seroquel tonight to 200 mg daily at bedtime as he did not sleep well last night. To prevent falls due to orthostatic hypotension I will discontinue Minipress for now  5/13 Patient said he had difficulties falling asleep. Feels that the medication beginning kicking onto to limit.  We'll try to move the Seroquel earlier this evening. No other changes   Hildred Priest, MD 09/17/2016, 12:35 PM

## 2016-09-17 NOTE — Progress Notes (Signed)
Patient was observed with a visitor at the beginning of this shift.  He asked appropriate questions about medications and depression.  He denies SI/HI/AVH and contracts for safety.  Behavior was calm this evening.

## 2016-09-18 LAB — GLUCOSE, CAPILLARY
GLUCOSE-CAPILLARY: 189 mg/dL — AB (ref 65–99)
GLUCOSE-CAPILLARY: 199 mg/dL — AB (ref 65–99)
Glucose-Capillary: 109 mg/dL — ABNORMAL HIGH (ref 65–99)
Glucose-Capillary: 255 mg/dL — ABNORMAL HIGH (ref 65–99)
Glucose-Capillary: 374 mg/dL — ABNORMAL HIGH (ref 65–99)

## 2016-09-18 MED ORDER — FLUVOXAMINE MALEATE 50 MG PO TABS: 150.0000 mg | ORAL_TABLET | Freq: Every day | ORAL | 1 refills | Status: DC

## 2016-09-18 MED ORDER — QUETIAPINE FUMARATE 200 MG PO TABS: 200.0000 mg | ORAL_TABLET | Freq: Every day | ORAL | 1 refills | Status: DC

## 2016-09-18 MED ORDER — FLUVOXAMINE MALEATE 50 MG PO TABS
150.0000 mg | ORAL_TABLET | Freq: Every day | ORAL | Status: DC
  Administered 2016-09-18: 150 mg via ORAL
  Filled 2016-09-18: qty 3

## 2016-09-18 MED ORDER — INSULIN ASPART 100 UNIT/ML ~~LOC~~ SOLN
6.0000 [IU] | Freq: Three times a day (TID) | SUBCUTANEOUS | Status: DC
  Administered 2016-09-18 – 2016-09-19 (×4): 6 [IU] via SUBCUTANEOUS
  Filled 2016-09-18 (×3): qty 6

## 2016-09-18 NOTE — BHH Group Notes (Signed)
[place] Group Notes:  (Nursing/MHT/Case Management/Adjunct)  Date:  09/18/2016  Time:  4:07 PM  Type of Therapy:  Psychoeducational Skills  Participation Level:  Active  Participation Quality:  Appropriate, Attentive and Supportive  Affect:  Appropriate  Cognitive:  Appropriate  Insight:  Appropriate  Engagement in Group:  Engaged and Supportive  Modes of Intervention:  Discussion, Education and Exploration  Summary of Progress/Problems:  [name] 09/18/2016, 4:07 PM

## 2016-09-18 NOTE — Progress Notes (Addendum)
Inpatient Diabetes Program Recommendations  AACE/ADA: New Consensus Statement on Inpatient Glycemic Control (2015)  Target Ranges:  Prepandial:   less than 140 mg/dL      Peak postprandial:   less than 180 mg/dL (1-2 hours)      Critically ill patients:  140 - 180 mg/dL   Lab Results  Component Value Date   GLUCAP 189 (H) 09/18/2016   HGBA1C 8.1 (H) 09/15/2016    Review of Glycemic Control:Results for KHING, BELCHER (MRN [id number]) as of 09/18/2016 10:14  Ref. Range 09/17/2016 06:48 09/17/2016 11:15 09/17/2016 16:36 09/17/2016 20:29 09/17/2016 21:49 09/18/2016 06:51  Glucose-Capillary Latest Ref Range: 65 - 99 mg/dL 130 (H) 81 280 (H) 71 96 189 (H)   Inpatient Diabetes Program Recommendations:    May consider reducing Novolog meal coverage to 6 units tid with meals (hold if patient eats less than 50%).    Thanks, Adah Perl, RN, BC-ADM Inpatient Diabetes Coordinator Pager [phone] (8a-5p)

## 2016-09-18 NOTE — BHH Group Notes (Signed)
[place] Group Notes:  (Nursing/MHT/Case Management/Adjunct)  Date:  09/18/2016  Time:  9:02 PM  Type of Therapy:  Group Therapy  Participation Level:  Active  Participation Quality:  Appropriate  Affect:  Appropriate  Cognitive:  Appropriate  Insight:  Appropriate  Engagement in Group:  Engaged  Modes of Intervention:  Discussion  Summary of Progress/Problems:  Kandis Fantasia 09/18/2016, 9:02 PM

## 2016-09-18 NOTE — Progress Notes (Signed)
[place] [facility] [place] NAME: [name]    MR#:  [id number]  DATE OF BIRTH:  Nov 17, 1960  SUBJECTIVE:   Patient admitted to behavioral medicine due to depresson with suicidal ideations and hospitalist servicere consulted for diabetes management. Patient is on a insulin pump but currently getting Lantus with NovoLog with meals and sliding scale coverage.  No complains presently.  BS was low, and decreased lantus dose.   REVIEW OF SYSTEMS:    Review of Systems  Constitutional: Negative for chills and fever.  HENT: Negative for congestion and tinnitus.   Eyes: Negative for blurred vision and double vision.  Respiratory: Negative for cough, shortness of breath and wheezing.   Cardiovascular: Negative for chest pain, orthopnea and PND.  Gastrointestinal: Negative for abdominal pain, diarrhea, nausea and vomiting.  Genitourinary: Negative for dysuria and hematuria.  Neurological: Negative for dizziness, sensory change and focal weakness.  All other systems reviewed and are negative.   Nutrition: Carb control Tolerating Diet: Yes Tolerating PT: Ambulatory  DRUG ALLERGIES:   Allergies  Allergen Reactions  . Buspar [Buspirone]     Makes the patient "flip out"  . Depakote [Valproic Acid]     Causes excessive drowsiness  . Clopidogrel Rash  . Gabapentin Itching and Rash    VITALS:  Blood pressure 129/77, pulse 72, temperature 97.9 F (36.6 C), temperature source Oral, resp. rate 18, SpO2 99 %.  PHYSICAL EXAMINATION:   Physical Exam  GENERAL:  56 y.o.-year-old obese patient sitting up in bed in no acute distress.  EYES: Pupils equal, round, reactive to light and accommodation. No scleral icterus. Extraocular muscles intact.  HEENT: Head atraumatic, normocephalic. Oropharynx and nasopharynx clear.  NECK:  Supple, no jugular venous distention. No thyroid enlargement, no tenderness.  LUNGS: Normal breath sounds bilaterally, no wheezing,  rales, rhonchi. No use of accessory muscles of respiration.  CARDIOVASCULAR: S1, S2 normal. No murmurs, rubs, or gallops.  ABDOMEN: Soft, nontender, nondistended. Bowel sounds present. No organomegaly or mass.  EXTREMITIES: No cyanosis, clubbing or edema b/l.    NEUROLOGIC: Cranial nerves II through XII are intact. No focal Motor or sensory deficits b/l.   PSYCHIATRIC: The patient is alert and oriented x 3.  SKIN: No obvious rash, lesion, or ulcer.    LABORATORY PANEL:   CBC  Recent Labs Lab 09/11/16 1851  WBC 10.2  HGB 16.5  HCT 47.6  PLT 315   ------------------------------------------------------------------------------------------------------------------  Chemistries   Recent Labs Lab 09/11/16 1851  NA 136  K 3.9  CL 103  CO2 21*  GLUCOSE 195*  BUN 24*  CREATININE 1.74*  CALCIUM 9.5  AST 18  ALT 17  ALKPHOS 92  BILITOT 0.8   ------------------------------------------------------------------------------------------------------------------  Cardiac Enzymes No results for input(s): TROPONINI in the last 168 hours. ------------------------------------------------------------------------------------------------------------------  RADIOLOGY:  No results found.   ASSESSMENT AND PLAN:   56 year old male with past medical hx of diabetes, hypertension, depression,hyperlipidemia, GERD, hypothyroidismwho presented to the hospital admitted to behavioral medicine due to hallucinations and suicidal ideations.  1. Diabetes type 2 without complication-patient is on insulin pump at home.This cannot be used in behavioral medicine. -Continue Lantus- decreased dose, and scheduled novolog with mealscontinue sliding scale insulin. Appreciate diabetes coronary input.  2. Essential hypertension-continue losartan, HCTZ, amlodipine.  3. Hyperlipidemia-continue Zocor.  4. Hypothyroidism-continue Synthroid.  5. Depression with suicidal ideations- cont. Luvox, Lexapro - cont.  Further care as per Psychiatry  6. BPH - cont. Prazosin.    Plan for  d/c tomorrow by psych team.  Pt is going to use his insulin pump once d/c- so no new recommendations. I will sign off now, please call - if any further questions arise.  All the records are reviewed and case discussed with Care Management/Social Worker. Management plans discussed with the patient, family and they are in agreement.  CODE STATUS: Full code  DVT Prophylaxis: Ambulatory  TOTAL TIME TAKING CARE OF THIS PATIENT: 25 minutes.   POSSIBLE D/C unclear and depends on behavioral medicine.   Vaughan Basta M.D on 09/18/2016 at 3:44 PM  Between 7am to 6pm - Pager - [phone]  After 6pm go to [email or web address] - Proofreader  [facility]  Hospitalists  Office  [phone]  CC: Primary care physician; Derinda Late, MD

## 2016-09-18 NOTE — Progress Notes (Signed)
[facility] MD Progress Note  09/18/2016 11:13 AM [name]  MRN:  [id number]  Subjective:  [name] is a 56 year old male with a history of depression and anxiety admitted for suicidal thoughts, auditory hallucinations, severe anxiety, and paninsomnia. Started Seroquel for sleep, Minipress for PTSD and Luvox 50 mg.  09/15/2016. [name] met with treatment team. He feels somewhat better as he was able to sleep with Seroquel. He had to take additional 50 mg last night. He is not too drowsy. He did not have any side effects from Minipress either. Vital signs are stable. He is on Lantus and Novolog now. There were no behavioral or safety problems and we were able to discontinue sitter. Good program participation.   09/16/16 [name] reports doing a little bit better. He did not sleep very well last night and have multiple awakenings throughout the night. He denies suicidality, homicidality or auditory or visual hallucinations. He is very worried about his glucose as it has been running highs since the insulin pump was discontinued. He denies side effects from medications or any physical complaints  5/13 [name] says he is feeling a little bit better. He had some trouble falling asleep as he feels the medication kicking too late. Other than that he does not have any issues or concerns. Denies suicidality, homicidality or auditory or visual hallucinations. He has been eating well. His diabetes is better control. He was seen by internal medicine will change his insulin regimen this morning.  09/18/2016. [name] met with treatment team. He reports much improvement. His mood is better, affect brighter. He slept better again last night after Seroquel wa increased to 200 mg. In spite of it, his diabetes is well controlled and we were able to further lower Novolog to 6 units before meals. He will be going back to insulin pump once discharged. He agrees to discontinue Lexapro as it was "not working" and keep Luvox.  There is much anxiety about discharge tomorrow.  Per nursing: [name] was observed with a visitor at the beginning of this shift.  He asked appropriate questions about medications and depression.  He denies SI/HI/AVH and contracts for safety.  Behavior was calm this evening.  Principal Problem: Major depressive disorder, recurrent severe without psychotic features ([place]) Diagnosis:   [name] Active Problem List   Diagnosis Date Noted  . Type 1 diabetes mellitus ([place]) [E10.9] 09/14/2016  . Major depressive disorder, recurrent severe without psychotic features ([place]) [F33.2] 09/13/2016  . Hypothyroidism [E03.9] 09/13/2016  . HTN (hypertension) [I10] 09/13/2016  . Dyslipidemia [E78.5] 09/13/2016  . GERD (gastroesophageal reflux disease) [K21.9] 09/13/2016   Total Time spent with [name]: 30 minutes  Past Psychiatric History: Depression, anxiety.  Past Medical History:  Past Medical History:  Diagnosis Date  . Depression   . Diabetes (HCC)    Insulin Pump  . Diabetes mellitus without complication ([place])   . GERD (gastroesophageal reflux disease)   . H/O laryngectomy   . Hyperlipidemia   . Hypertension   . Suicide attempt [facility]) 2014   damaged larynx - tracheostomy  . Thyroid disease     Past Surgical History:  Procedure Laterality Date  . LARYNGECTOMY    . TRACHEOSTOMY  2014   from [place] attempt   Family History: History reviewed. No pertinent family history. Family Psychiatric  History: See H&P. Social History:  History  Alcohol Use No    Comment: Pt denied; BAC not available     History  Drug Use No  Comment: Pt denied; UDS not available    Social History   Social History  . Marital status: Single    Spouse name: N/A  . Number of children: N/A  . Years of education: N/A   Social History Main Topics  . Smoking status: Former Smoker    Packs/day: 0.00    Types: Cigarettes    Quit date: 04/09/2013  . Smokeless tobacco: Never Used  . Alcohol use No     Comment:  Pt denied; BAC not available  . Drug use: No     Comment: Pt denied; UDS not available  . Sexual activity: No   Other Topics Concern  . None   Social History Narrative  . None     Current Medications: Current Facility-Administered Medications  Medication Dose Route Frequency Provider Last Rate Last Dose  . acetaminophen (TYLENOL) tablet 650 mg  650 mg Oral Q6H PRN Woodfin Kiss B, MD   650 mg at 09/17/16 1826  . alum & mag hydroxide-simeth (MAALOX/MYLANTA) 200-200-20 MG/5ML suspension 30 mL  30 mL Oral Q4H PRN Niquita Digioia B, MD      . amLODipine (NORVASC) tablet 5 mg  5 mg Oral Daily Daris Harkins B, MD   5 mg at 09/18/16 0748  . aspirin EC tablet 81 mg  81 mg Oral Daily Velinda Wrobel B, MD   81 mg at 09/18/16 0748  . fluticasone (FLONASE) 50 MCG/ACT nasal spray 2 spray  2 spray Each Nare Daily Ilanna Deihl B, MD   2 spray at 09/18/16 0749  . fluvoxaMINE (LUVOX) tablet 150 mg  150 mg Oral QHS Lukah Goswami B, MD      . hydrochlorothiazide (MICROZIDE) capsule 12.5 mg  12.5 mg Oral Daily Galen Russman B, MD   12.5 mg at 09/18/16 0748  . insulin aspart (novoLOG) injection 0-5 Units  0-5 Units Subcutaneous QHS Ricquel Foulk B, MD   2 Units at 09/14/16 2214  . insulin aspart (novoLOG) injection 0-9 Units  0-9 Units Subcutaneous TID WC Devine Klingel B, MD   2 Units at 09/18/16 0747  . insulin aspart (novoLOG) injection 6 Units  6 Units Subcutaneous TID WC Shevaun Lovan B, MD      . insulin glargine (LANTUS) injection 22 Units  22 Units Subcutaneous QHS Hillary Bow, MD   22 Units at 09/17/16 2103  . levothyroxine (SYNTHROID, LEVOTHROID) tablet 75 mcg  75 mcg Oral QAC breakfast Shyniece Scripter B, MD   75 mcg at 09/18/16 0650  . loratadine (CLARITIN) tablet 10 mg  10 mg Oral Daily Autie Vasudevan B, MD   10 mg at 09/18/16 0748  . losartan (COZAAR) tablet 100 mg  100 mg Oral Daily Jahlon Baines B, MD   100 mg at  09/18/16 0748  . pantoprazole (PROTONIX) EC tablet 40 mg  40 mg Oral BID Nadirah Socorro B, MD   40 mg at 09/18/16 0747  . QUEtiapine (SEROQUEL) tablet 200 mg  200 mg Oral QHS Hildred Priest, MD   200 mg at 09/17/16 2115  . simvastatin (ZOCOR) tablet 20 mg  20 mg Oral Daily Mattix Imhof B, MD   20 mg at 09/18/16 0747   Facility-Administered Medications Ordered in Other Encounters  Medication Dose Route Frequency Provider Last Rate Last Dose  . acetaminophen (TYLENOL) tablet 650 mg  650 mg Oral Q6H PRN Chalese Peach B, MD      . alum & mag hydroxide-simeth (MAALOX/MYLANTA) 200-200-20 MG/5ML suspension 30 mL  30 mL Oral Q4H  PRN Grantham Hippert B, MD      . fluticasone (FLONASE) 50 MCG/ACT nasal spray 2 spray  2 spray Each Nare Daily Albert Hersch B, MD      . loratadine (CLARITIN) tablet 10 mg  10 mg Oral Daily Ilyanna Baillargeon B, MD      . magnesium hydroxide (MILK OF MAGNESIA) suspension 30 mL  30 mL Oral Daily PRN Felina Tello B, MD        Lab Results:  Results for orders placed or performed during the hospital encounter of 09/14/16 (from the past 48 hour(s))  Glucose, capillary     Status: None   Collection Time: 09/16/16  4:24 PM  Result Value Ref Range   Glucose-Capillary 92 65 - 99 mg/dL  Glucose, capillary     Status: Abnormal   Collection Time: 09/16/16  8:52 PM  Result Value Ref Range   Glucose-Capillary 51 (L) 65 - 99 mg/dL  Glucose, capillary     Status: None   Collection Time: 09/16/16  9:24 PM  Result Value Ref Range   Glucose-Capillary 81 65 - 99 mg/dL  Glucose, capillary     Status: Abnormal   Collection Time: 09/17/16  6:48 AM  Result Value Ref Range   Glucose-Capillary 130 (H) 65 - 99 mg/dL  Glucose, capillary     Status: None   Collection Time: 09/17/16 11:15 AM  Result Value Ref Range   Glucose-Capillary 81 65 - 99 mg/dL  Glucose, capillary     Status: Abnormal   Collection Time: 09/17/16  4:36 PM  Result Value  Ref Range   Glucose-Capillary 280 (H) 65 - 99 mg/dL  Glucose, capillary     Status: None   Collection Time: 09/17/16  8:29 PM  Result Value Ref Range   Glucose-Capillary 71 65 - 99 mg/dL  Glucose, capillary     Status: None   Collection Time: 09/17/16  9:49 PM  Result Value Ref Range   Glucose-Capillary 96 65 - 99 mg/dL  Glucose, capillary     Status: Abnormal   Collection Time: 09/18/16  6:51 AM  Result Value Ref Range   Glucose-Capillary 189 (H) 65 - 99 mg/dL   Comment 1 Notify RN     Blood Alcohol level:  Lab Results  Component Value Date   ETH <5 09/11/2016   ETH <5 45/07/8880    Metabolic Disorder Labs: Lab Results  Component Value Date   HGBA1C 8.1 (H) 09/15/2016   MPG 186 09/15/2016   No results found for: PROLACTIN Lab Results  Component Value Date   CHOL 161 09/15/2016   TRIG 190 (H) 09/15/2016   HDL 41 09/15/2016   CHOLHDL 3.9 09/15/2016   VLDL 38 09/15/2016   LDLCALC 82 09/15/2016    Physical Findings: AIMS:  , ,  ,  ,    CIWA:    COWS:     Musculoskeletal: Strength & Muscle Tone: within normal limits Gait & Station: normal [name] leans: N/A  Psychiatric Specialty Exam: Physical Exam  Nursing note and vitals reviewed. Constitutional: He is oriented to person, place, and time.  Neurological: He is oriented to person, place, and time.  Psychiatric: His speech is normal and behavior is normal. Thought content normal. His mood appears anxious. Cognition and memory are normal. He expresses impulsivity.    Review of Systems  Psychiatric/Behavioral: Positive for depression. The [name] is nervous/anxious and has insomnia.   All other systems reviewed and are negative.   Blood pressure 129/77, pulse  72, temperature 97.9 F (36.6 C), temperature source Oral, resp. rate 18, SpO2 99 %.There is no height or weight on file to calculate BMI.  General Appearance: Casual  Eye Contact:  Good  Speech:  Clear and Coherent  Volume:  Normal  Mood:  Anxious   Affect:  Congruent  Thought Process:  Goal Directed and Descriptions of Associations: Intact  Orientation:  Full (Time, Place, and Person)  Thought Content:  WDL  Suicidal Thoughts:  No  Homicidal Thoughts:  No  Memory:  Immediate;   Fair Recent;   Fair Remote;   Fair  Judgement:  Fair  Insight:  Fair  Psychomotor Activity:  Normal  Concentration:  Concentration: Fair and Attention Span: Fair  Recall:  [employer] of Knowledge:  Fair  Language:  Fair  Akathisia:  No  Handed:  Right  AIMS (if indicated):     Assets:  Communication Skills Desire for Improvement Financial Resources/Insurance Housing Resilience Social Support Transportation  ADL's:  Intact  Cognition:  WNL  Sleep:  Number of Hours: 6     Treatment Plan Summary: Daily contact with [name] to assess and evaluate symptoms and progress in treatment and Medication management   [name] is a 56 year old male with a history of depression and anxiety admitted for worsening depression, suicidal ideation and auditory command hallucinations.  1. Suicidal ideation. The [name] is able to contract for safety in the hospital.  2. Mood. We discontinued Lexapro and started Luvox for depression and anxiety which the [name] tolerates well. We also added Seroquel for mood stabilization.   3. Type I diabetes. The [name] uses insulin pump at home. Pump was discontinued at [facility]. Medicine input and input from Diabetes Nursse Coordinators is greatly appreciated. I spoke with Gentry Fitz about recommendation made by Dr. Eddie Dibbles, endocrinologist,  and made Dr. Posey Pronto aware. The [name] is on on Lantus, Novolog tid, ADA diet, SSI, CBG monitoing.  4. PTSD. He tolerated a single dose of 1 mg Minipress well. Minipress 1 mg twice daily for nightmares and flashbacks.   5. Insomnia. The [name] did not respond to Trazodone, Remeron or Ambien. The only medication he remembers put him to sleep was Seroquel.Seroquel had to be  increased to 200 mg nightly. I am aware of his diabetes but the [name] has not slept in days.  6. Dyslipidemia. He is on Zocor.  7. HTN. He is on ASA, HCTZ, Norvasc and Cozaar.  8. Hypothyroidism. He is on Synthroid.  9. GERD. He is on Protonix.  10. Metabolic syndrome monitoring. Lipid panel shows slightly elevated triglycerides, TSH is normal, HgbA1C 8.1.   11. EKG. Normal sinus rhythm with short PR, QTc 431.  12. Disposition. He will be discharged to home. He will follow up with a new psychiatrist.    Orson Slick, MD 09/18/2016, 11:13 AM

## 2016-09-18 NOTE — Progress Notes (Signed)
Patient is pleasant & cooperative in the unit.Patient rated his depression 0/10 and anxiety 5/10.Stated that he anxious about the discharge tomorrow that what he is going to do at home.Appropriate with staff & peers.Compliant with meds and groups.Appetite & energy level good.Support & encouragement given.

## 2016-09-18 NOTE — B[facility] Group Notes (Signed)
[facility] Group Notes:  (Nursing/MHT/Case Management/Adjunct)  Date:  09/18/2016  Time:  3:08 AM  Type of Therapy:  Group Therapy  Participation Level:  Active  Participation Quality:  Appropriate  Affect:  Appropriate  Cognitive:  Appropriate  Insight:  Appropriate  Engagement in Group:  Engaged  Modes of Intervention:  Discussion  Summary of Progress/Problems:  Kandis Fantasia 09/18/2016, 3:08 AM

## 2016-09-18 NOTE — Tx Team (Signed)
Interdisciplinary Treatment and Diagnostic Plan Update  09/18/2016 Time of Session: [place] MRN: [id number]  Principal Diagnosis: Major depressive disorder, recurrent severe without psychotic features ([place])  Secondary Diagnoses: Principal Problem:   Major depressive disorder, recurrent severe without psychotic features ([place]) Active Problems:   Hypothyroidism   HTN (hypertension)   Dyslipidemia   GERD (gastroesophageal reflux disease)   Type 1 diabetes mellitus ([place])   Current Medications:  Current Facility-Administered Medications  Medication Dose Route Frequency Provider Last Rate Last Dose  . acetaminophen (TYLENOL) tablet 650 mg  650 mg Oral Q6H PRN Pucilowska, Jolanta B, MD   650 mg at 09/17/16 1826  . alum & mag hydroxide-simeth (MAALOX/MYLANTA) 200-200-20 MG/5ML suspension 30 mL  30 mL Oral Q4H PRN Pucilowska, Jolanta B, MD      . amLODipine (NORVASC) tablet 5 mg  5 mg Oral Daily Pucilowska, Jolanta B, MD   5 mg at 09/18/16 0748  . aspirin EC tablet 81 mg  81 mg Oral Daily Pucilowska, Jolanta B, MD   81 mg at 09/18/16 0748  . escitalopram (LEXAPRO) tablet 20 mg  20 mg Oral Daily Pucilowska, Jolanta B, MD   20 mg at 09/18/16 0747  . fluticasone (FLONASE) 50 MCG/ACT nasal spray 2 spray  2 spray Each Nare Daily Pucilowska, Jolanta B, MD   2 spray at 09/18/16 0749  . fluvoxaMINE (LUVOX) tablet 100 mg  100 mg Oral QHS Pucilowska, Jolanta B, MD   100 mg at 09/17/16 2116  . hydrochlorothiazide (MICROZIDE) capsule 12.5 mg  12.5 mg Oral Daily Pucilowska, Jolanta B, MD   12.5 mg at 09/18/16 0748  . insulin aspart (novoLOG) injection 0-5 Units  0-5 Units Subcutaneous QHS Pucilowska, Jolanta B, MD   2 Units at 09/14/16 2214  . insulin aspart (novoLOG) injection 0-9 Units  0-9 Units Subcutaneous TID WC Pucilowska, Jolanta B, MD   2 Units at 09/18/16 0747  . insulin aspart (novoLOG) injection 7 Units  7 Units Subcutaneous TID WC Hillary Bow, MD   7 Units at 09/18/16 0747  .  insulin glargine (LANTUS) injection 22 Units  22 Units Subcutaneous QHS Hillary Bow, MD   22 Units at 09/17/16 2103  . levothyroxine (SYNTHROID, LEVOTHROID) tablet 75 mcg  75 mcg Oral QAC breakfast Pucilowska, Jolanta B, MD   75 mcg at 09/18/16 0650  . loratadine (CLARITIN) tablet 10 mg  10 mg Oral Daily Pucilowska, Jolanta B, MD   10 mg at 09/18/16 0748  . losartan (COZAAR) tablet 100 mg  100 mg Oral Daily Pucilowska, Jolanta B, MD   100 mg at 09/18/16 0748  . pantoprazole (PROTONIX) EC tablet 40 mg  40 mg Oral BID Pucilowska, Jolanta B, MD   40 mg at 09/18/16 0747  . QUEtiapine (SEROQUEL) tablet 200 mg  200 mg Oral QHS Hildred Priest, MD   200 mg at 09/17/16 2115  . simvastatin (ZOCOR) tablet 20 mg  20 mg Oral Daily Pucilowska, Jolanta B, MD   20 mg at 09/18/16 0747   Facility-Administered Medications Ordered in Other Encounters  Medication Dose Route Frequency Provider Last Rate Last Dose  . acetaminophen (TYLENOL) tablet 650 mg  650 mg Oral Q6H PRN Pucilowska, Jolanta B, MD      . alum & mag hydroxide-simeth (MAALOX/MYLANTA) 200-200-20 MG/5ML suspension 30 mL  30 mL Oral Q4H PRN Pucilowska, Jolanta B, MD      . fluticasone (FLONASE) 50 MCG/ACT nasal spray 2 spray  2 spray Each Nare Daily Pucilowska, Jolanta  B, MD      . loratadine (CLARITIN) tablet 10 mg  10 mg Oral Daily Pucilowska, Jolanta B, MD      . magnesium hydroxide (MILK OF MAGNESIA) suspension 30 mL  30 mL Oral Daily PRN Pucilowska, Jolanta B, MD       PTA Medications: Prescriptions Prior to Admission  Medication Sig Dispense Refill Last Dose  . amLODipine (NORVASC) 5 MG tablet Take 5 mg by mouth daily.   09/11/2016  . aspirin EC 81 MG tablet Take 1 tablet by mouth daily.   09/11/2016  . divalproex (DEPAKOTE ER) 500 MG 24 hr tablet Take 500 mg by mouth daily.   09/10/2016  . escitalopram (LEXAPRO) 20 MG tablet Take 20 mg by mouth daily.   09/11/2016  . Fexofenadine HCl (ALLEGRA PO) Take 180 mg by mouth daily.    09/12/2015   . fluticasone (FLONASE) 50 MCG/ACT nasal spray Place 2 sprays into both nostrils daily.   09/11/2016  . hydrochlorothiazide (HYDRODIURIL) 12.5 MG tablet Take 12.5 mg by mouth daily.   09/11/2016  . insulin aspart (NOVOLOG) 100 UNIT/ML injection Inject 1.15-1.25 Units into the skin See admin instructions. Via insulin pump-basal rate: 1.15-1.25 units/hr (every 8 carbs = 1 unit of insulin bolus) 0000-0800 = 1.15 units/hr and 0801/2359 = 1.25 units/hr   Continuous at Continuous  . levothyroxine (SYNTHROID, LEVOTHROID) 75 MCG tablet Take 75 mcg by mouth daily before breakfast.   09/11/2016  . losartan (COZAAR) 100 MG tablet Take 1 tablet by mouth daily.   09/11/2016  . pantoprazole (PROTONIX) 40 MG tablet Take 1 tablet by mouth 2 (two) times daily.   09/11/2016  . simvastatin (ZOCOR) 20 MG tablet Take 1 tablet by mouth daily.   09/10/2016  . [DISCONTINUED] DiphenhydrAMINE HCl (ZZZQUIL) 50 MG/30ML LIQD Take 50 mg by mouth at bedtime as needed (for sleep).    PRN at PRN  . [DISCONTINUED] Diphenhydramine-APAP, sleep, (TYLENOL PM EXTRA STRENGTH PO) Take 1 tablet by mouth at bedtime as needed (for sleep).    PRN at PRN    Patient Stressors: Health problems Occupational concerns  Patient Strengths: Average or above average intelligence Capable of independent living Supportive family/friends  Treatment Modalities: Medication Management, Group therapy, Case management,  1 to 1 session with clinician, Psychoeducation, Recreational therapy.   Physician Treatment Plan for Primary Diagnosis: Major depressive disorder, recurrent severe without psychotic features ([place]) Long Term Goal(s): Improvement in symptoms so as ready for discharge NA   Short Term Goals: Ability to identify changes in lifestyle to reduce recurrence of condition will improve Ability to verbalize feelings will improve Ability to disclose and discuss suicidal ideas Ability to demonstrate self-control will improve Ability to identify and develop  effective coping behaviors will improve Compliance with prescribed medications will improve Ability to identify triggers associated with substance abuse/mental health issues will improve NA  Medication Management: Evaluate patient's response, side effects, and tolerance of medication regimen.  Therapeutic Interventions: 1 to 1 sessions, Unit Group sessions and Medication administration.  Evaluation of Outcomes: Progressing  Physician Treatment Plan for Secondary Diagnosis: Principal Problem:   Major depressive disorder, recurrent severe without psychotic features ([place]) Active Problems:   Hypothyroidism   HTN (hypertension)   Dyslipidemia   GERD (gastroesophageal reflux disease)   Type 1 diabetes mellitus ([place])  Long Term Goal(s): Improvement in symptoms so as ready for discharge NA   Short Term Goals: Ability to identify changes in lifestyle to reduce recurrence of condition will improve Ability to verbalize  feelings will improve Ability to disclose and discuss suicidal ideas Ability to demonstrate self-control will improve Ability to identify and develop effective coping behaviors will improve Compliance with prescribed medications will improve Ability to identify triggers associated with substance abuse/mental health issues will improve NA     Medication Management: Evaluate patient's response, side effects, and tolerance of medication regimen.  Therapeutic Interventions: 1 to 1 sessions, Unit Group sessions and Medication administration.  Evaluation of Outcomes: Progressing   RN Treatment Plan for Primary Diagnosis: Major depressive disorder, recurrent severe without psychotic features ([place]) Long Term Goal(s): Knowledge of disease and therapeutic regimen to maintain health will improve  Short Term Goals: Ability to remain free from injury will improve, Ability to verbalize feelings will improve, Ability to disclose and discuss suicidal ideas, Ability to identify and  develop effective coping behaviors will improve and Compliance with prescribed medications will improve  Medication Management: RN will administer medications as ordered by provider, will assess and evaluate patient's response and provide education to patient for prescribed medication. RN will report any adverse and/or side effects to prescribing provider.  Therapeutic Interventions: 1 on 1 counseling sessions, Psychoeducation, Medication administration, Evaluate responses to treatment, Monitor vital signs and CBGs as ordered, Perform/monitor CIWA, COWS, AIMS and Fall Risk screenings as ordered, Perform wound care treatments as ordered.  Evaluation of Outcomes: Progressing   LCSW Treatment Plan for Primary Diagnosis: Major depressive disorder, recurrent severe without psychotic features ([place]) Long Term Goal(s): Safe transition to appropriate next level of care at discharge, Engage patient in therapeutic group addressing interpersonal concerns.  Short Term Goals: Engage patient in aftercare planning with referrals and resources and Increase skills for wellness and recovery  Therapeutic Interventions: Assess for all discharge needs, 1 to 1 time with Social worker, Explore available resources and support systems, Assess for adequacy in community support network, Educate family and significant other(s) on suicide prevention, Complete Psychosocial Assessment, Interpersonal group therapy.  Evaluation of Outcomes: Progressing    Recreational Therapy Treatment Plan for Primary Diagnosis: Major depressive disorder, recurrent severe without psychotic features ([place]) Long Term Goal(s): Patient will participate in recreation therapy treatment in at least 2 group sessions without prompting from LRT  Short Term Goals: Increase self-esteem, Increase stress management skills  Treatment Modalities: Group Therapy and Individual Treatment Sessions  Therapeutic Interventions: Psychoeducation  Evaluation of  Outcomes: Progressing   Progress in Treatment: Attending groups: Yes. Participating in groups: Yes. Taking medication as prescribed: Yes. Toleration medication: Yes. Family/Significant other contact made: Yes, individual(s) contacted:  mother Patient understands diagnosis: Yes. Discussing patient identified problems/goals with staff: Yes. Medical problems stabilized or resolved: Yes. Denies suicidal/homicidal ideation: Yes. Issues/concerns per patient self-inventory: No. Other: none  New problem(s) identified: No, Describe:  none  New Short Term/Long Term Goal(s): Pt goal is to find something that will keep me from falling back into depression.  Discharge Plan or Barriers: Patient will discharge home and follow-up with [facility].   Reason for Continuation of Hospitalization: Depression Medication stabilization  Estimated Length of Stay: 3-4 days.  ttendees: Patient: [name] 09/15/2016   Physician: Dr. Orson Slick, MD  09/15/2016   Nursing: Elige Radon, RN 09/15/2016   RN Care Manager: 09/15/2016   Social Worker: Glorious Peach, MSW, LCSW-A 09/15/2016   Recreational Therapist: 09/15/2016   Other:  09/15/2016   Other:  09/15/2016   Other: 09/15/2016        Scribe for Treatment Team: Emilie Rutter, [name] 09/18/2016 10:58 AM

## 2016-09-18 NOTE — Progress Notes (Signed)
Note plans for d/c tomorrow.  Patient should be able to resume insulin pump tomorrow evening (to receive Lantus tonight at HS) around 8 pm.  Needs to communicate with endocrinologist that he will be resuming insulin pump as well.  Will follow.  Thanks, Adah Perl, RN, BC-ADM Inpatient Diabetes Coordinator Pager [phone] (8a-5p)

## 2016-09-19 ENCOUNTER — Telehealth: Payer: Self-pay | Admitting: Dietician

## 2016-09-19 LAB — GLUCOSE, CAPILLARY
GLUCOSE-CAPILLARY: 438 mg/dL — AB (ref 65–99)
Glucose-Capillary: 74 mg/dL (ref 65–99)
Glucose-Capillary: 220 mg/dL — ABNORMAL HIGH (ref 65–99)

## 2016-09-19 NOTE — Telephone Encounter (Signed)
Called pt-no answer-left message for pt to call me with update and let me know if he wants to schedule a FU appointment

## 2016-09-19 NOTE — Progress Notes (Signed)
Pt voicing readiness for discharge. Reports fair sleep with the help of sleep medication, but it is reported he only slept 4.5 hours. Reports good appetite, normal energy, good concentration. Rates depression 0/10, hopelessness 0/10, anxiety 3/10 (low 0-10 high). Denies SI/HI/AVH, pain. Pt reports he preformed trach care this morning without issue. Goal today is "discharge plan" by "whatever it takes." Pt provided list of needs prior to discharging to include: "suggestions for getting part time job, appointment with psychiatrist in [place] other than Dr. Dwyane Dee, appointment with therapist, and references for support groups in the area." SW informed of pt's requests. Pt pleasant and cooperative. Attends groups. Completed ADLs independently. Support and encouragement provided. Medications administered as ordered with education. Safety maintained with every 15 minute checks. Will continue to monitor.

## 2016-09-19 NOTE — Progress Notes (Signed)
CBG 220. Pt's mother here to pick him up for transport home.

## 2016-09-19 NOTE — Progress Notes (Addendum)
Inpatient Diabetes Program Recommendations  AACE/ADA: New Consensus Statement on Inpatient Glycemic Control (2015)  Target Ranges:  Prepandial:   less than 140 mg/dL      Peak postprandial:   less than 180 mg/dL (1-2 hours)      Critically ill patients:  140 - 180 mg/dL   Lab Results  Component Value Date   GLUCAP 74 09/19/2016   HGBA1C 8.1 (H) 09/15/2016    Review of Glycemic Control  Results for TRAPPER, MEECH (MRN [id number]) as of 09/19/2016 08:06  Ref. Range 09/18/2016 11:43 09/18/2016 16:42 09/18/2016 20:32 09/18/2016 23:37 09/19/2016 06:49  Glucose-Capillary Latest Ref Range: 65 - 99 mg/dL 255 (H) 109 (H) 199 (H) 374 (H) 74    Diabetes history: Type 1 Outpatient Diabetes medications: insulin pump Current orders for Inpatient glycemic control: Novolog 0-9 units tid, Novolog 6 units tid, Lantus 22 units qhs, Novolog 0-5 units qhs  Inpatient Diabetes Program Recommendations:  Patient received last dose of Lantus insulin at 2200 last night.  At discharge, patient can place pump back on and suspend the basal insulin until 2100 at which time he can resume.  He can use the insulin pump to deliver meal coverage before 2200.  Met with patient to discuss above recommendations- he verbalizes understanding.  I will notify Dr. Eddie Dibbles of his impending discharge.  I have encouraged Prateek to reach out to her when he gets home. Dariush will put his pump on when he gets home.  Gentry Fitz, RN, BA, MHA, CDE Diabetes Coordinator Inpatient Diabetes Program  [phone] (Team Pager) [phone] ([place]) 09/19/2016 8:12 AM

## 2016-09-19 NOTE — Discharge Summary (Signed)
Physician Discharge Summary Note  Patient:  [name] is an 56 y.o., male MRN:  [id number] DOB:  [birth year] Patient phone:  [phone] (home)  Patient address:   [place] [address],  Total Time spent with patient: 30 minutes  Date of Admission:  09/14/2016 Date of Discharge: 09/19/2016  Reason for Admission:  Suicidal ideation.  Identifying data. [name] is a 56 year old male with a history of severe depression and anxiety.  Chief complaint. "I had suicidal thoughts and the voices telling me to kill myself but it is over now."  History of present illness. Information was obtained from the patient and the chart. The patient has a long history of depression and anxiety that has worsened in the past month in spite of doubling the dose of antidepressant and addition of Depakote. The patient reports that he stayed in bed for a month and lost 30 lbs. On Monday he suddenly became overwhelmed with thoughts of suicide, severe anxiety and auditory command hallucinations ordering him to kill himself. He came to the hospital asking for help. He endorses all symptoms of depression with poor sleep, decreased appetite, anhedonia, feeling of hopelessness, worthlessness and guilt, poor energy and concentration, social isolation, crying spells, and heightened anxiety. He briefly experienced auditory hallucinations but no other psychotic symptoms. He reports panic attacks and believes that on Monday he experienced a major one. He reports flashbacks and nightmares related to his extended and traumatic hospitalization at [facility] after overdose several years ago. He reports mild symptoms of OCD. He denies any curren alcohol, prescription pill or illicit substance use.  Past psychiatric history. Long history of depression and anxiety. He has been a patient of Dr. Nicolasa Ducking until about a month ago. He was maintained on Lexapro. He remembers BuSpar, Remeron and Depakote being added to is regimen. He  disliked Depakote and BuSpar. He was discharged from Dr. Waylan Boga practice after he overdosed on Remeron. He denies suicide intent, rather he explains that he was taking more Remeron to induce sleep. Insomnia has been a problem all along. He had a serious overdose on opioids that led to extended hospitalization in CCU and tracheostomy. Again, he denies suicide intent but explains that he was taking opioids "to feel better". In the past, but not currently, he abused alcohol, opiods and Xanax.  Family psychiatric history.  Social history. He is divorced. He is now disabled and lives by himself. He has been eating poorly and not engaging in any activities lately. He is type I diabetic and interestingly, takes excellent care of himself and has been trusted with insulin pump for years.   Principal Problem: Major depressive disorder, recurrent severe without psychotic features [facility]) Discharge Diagnoses: Patient Active Problem List   Diagnosis Date Noted  . Type 1 diabetes mellitus ([place]) [E10.9] 09/14/2016  . Major depressive disorder, recurrent severe without psychotic features ([place]) [F33.2] 09/13/2016  . Hypothyroidism [E03.9] 09/13/2016  . HTN (hypertension) [I10] 09/13/2016  . Dyslipidemia [E78.5] 09/13/2016  . GERD (gastroesophageal reflux disease) [K21.9] 09/13/2016   Past Medical History:  Past Medical History:  Diagnosis Date  . Depression   . Diabetes (HCC)    Insulin Pump  . Diabetes mellitus without complication ([place])   . GERD (gastroesophageal reflux disease)   . H/O laryngectomy   . Hyperlipidemia   . Hypertension   . Suicide attempt [facility]) 2014   damaged larynx - tracheostomy  . Thyroid disease     Past Surgical History:  Procedure Laterality Date  .  LARYNGECTOMY    . TRACHEOSTOMY  2014   from [place] attempt   Family History: History reviewed. No pertinent family history.  Social History:  History  Alcohol Use No    Comment: Pt denied; BAC not available     History   Drug Use No    Comment: Pt denied; UDS not available    Social History   Social History  . Marital status: Single    Spouse name: N/A  . Number of children: N/A  . Years of education: N/A   Social History Main Topics  . Smoking status: Former Smoker    Packs/day: 0.00    Types: Cigarettes    Quit date: 04/09/2013  . Smokeless tobacco: Never Used  . Alcohol use No     Comment: Pt denied; BAC not available  . Drug use: No     Comment: Pt denied; UDS not available  . Sexual activity: No   Other Topics Concern  . None   Social History Narrative  . None    Hospital Course:    [name] is a 56 year old male with a history of depression and anxiety admitted for worsening depression, suicidal ideation and auditory command hallucinations.  1. Suicidal ideation. Resolved. The patient is able to contract for safety. He is forward thinking and optimistic about the future. He is a loving son.  2. Mood. We discontinued Lexapro and started Luvox for depression and anxiety, and added Seroquel for mood stabilization.   3. Type I diabetes. The patient uses insulin pump at home. Pump was discontinued at [facility]. The patient was maintained on Lantus, Novolog tid, ADA diet, SSI, CBG monitoing.  4. PTSD. Improved with Luvox.   5. Insomnia. The patient did not respond to Trazodone, Remeron or Ambien but slept better with Seroquel. The patient is aware of effects of Seroquel on but insomnia has been a major problem.   6. Dyslipidemia. He is on Zocor.  7. HTN. He is on ASA, HCTZ, Norvasc and Cozaar.  8. Hypothyroidism. He is on Synthroid.  9. GERD. He is on Protonix.  10. Metabolic syndrome monitoring. Lipid panel shows slightly elevated triglycerides, TSH is normal, HgbA1C 8.1.   11. EKG. Normal sinus rhythm with short PR, QTc 431.  12. Disposition. He was discharged to home. He will follow up with a therapist at [facility] and a new psychiatrist.   Physical Findings: AIMS:  ,  ,  ,  ,    CIWA:    COWS:     Musculoskeletal: Strength & Muscle Tone: within normal limits Gait & Station: normal Patient leans: N/A  Psychiatric Specialty Exam: Physical Exam  Nursing note and vitals reviewed. Psychiatric: He has a normal mood and affect. His speech is normal and behavior is normal. Thought content normal. Cognition and memory are normal. He expresses impulsivity.    Review of Systems  Psychiatric/Behavioral: The patient is nervous/anxious and has insomnia.   All other systems reviewed and are negative.   Blood pressure (!) 142/74, pulse 68, temperature 98 F (36.7 C), temperature source Oral, resp. rate 18, SpO2 99 %.There is no height or weight on file to calculate BMI.  General Appearance: Casual  Eye Contact:  Good  Speech:  Clear and Coherent  Volume:  Normal  Mood:  Anxious  Affect:  Appropriate  Thought Process:  Goal Directed and Descriptions of Associations: Intact  Orientation:  Full (Time, Place, and Person)  Thought Content:  WDL  Suicidal Thoughts:  No  Homicidal Thoughts:  No  Memory:  Immediate;   Fair Recent;   Fair Remote;   Fair  Judgement:  Fair  Insight:  Present  Psychomotor Activity:  Normal  Concentration:  Concentration: Fair and Attention Span: Fair  Recall:  [employer] of Knowledge:  Fair  Language:  Poor  Akathisia:  No  Handed:  Right  AIMS (if indicated):     Assets:  Communication Skills Desire for Improvement Financial Resources/Insurance Housing Resilience Social Support Transportation  ADL's:  Intact  Cognition:  WNL  Sleep:  Number of Hours: 4.5     Have you used any form of tobacco in the last 30 days? (Cigarettes, Smokeless Tobacco, Cigars, and/or Pipes): No  Has this patient used any form of tobacco in the last 30 days? (Cigarettes, Smokeless Tobacco, Cigars, and/or Pipes) Yes, No  Blood Alcohol level:  Lab Results  Component Value Date   ETH <5 09/11/2016   ETH <5 61/44/3154    Metabolic  Disorder Labs:  Lab Results  Component Value Date   HGBA1C 8.1 (H) 09/15/2016   MPG 186 09/15/2016   No results found for: PROLACTIN Lab Results  Component Value Date   CHOL 161 09/15/2016   TRIG 190 (H) 09/15/2016   HDL 41 09/15/2016   CHOLHDL 3.9 09/15/2016   VLDL 38 09/15/2016   [place] 82 09/15/2016    See Psychiatric Specialty Exam and Suicide Risk Assessment completed by Attending Physician prior to discharge.  Discharge destination:  Home  Is patient on multiple antipsychotic therapies at discharge:  No   Has Patient had three or more failed trials of antipsychotic monotherapy by history:  No  Recommended Plan for Multiple Antipsychotic Therapies: NA  Discharge Instructions    Diet - low sodium heart healthy    Complete by:  As directed    Increase activity slowly    Complete by:  As directed      Allergies as of 09/19/2016      Reactions   Buspar [buspirone]    Makes the patient "flip out"   Depakote [valproic Acid]    Causes excessive drowsiness   Clopidogrel Rash   Gabapentin Itching, Rash      Medication List    STOP taking these medications   divalproex 500 MG 24 hr tablet Commonly known as:  DEPAKOTE ER   escitalopram 20 MG tablet Commonly known as:  LEXAPRO     TAKE these medications     Indication  ALLEGRA PO Take 180 mg by mouth daily.  Indication:  allergies   amLODipine 5 MG tablet Commonly known as:  NORVASC Take 5 mg by mouth daily.  Indication:  High Blood Pressure Disorder   aspirin EC 81 MG tablet Take 1 tablet by mouth daily.  Indication:  Inflammation   fluticasone 50 MCG/ACT nasal spray Commonly known as:  FLONASE Place 2 sprays into both nostrils daily.  Indication:  Signs and Symptoms of Nose Diseases   fluvoxaMINE 50 MG tablet Commonly known as:  LUVOX Take 3 tablets (150 mg total) by mouth at bedtime.  Indication:  Depression, Obsessive Compulsive Disorder, Posttraumatic Stress Disorder   hydrochlorothiazide  12.5 MG tablet Commonly known as:  HYDRODIURIL Take 12.5 mg by mouth daily.  Indication:  High Blood Pressure Disorder   levothyroxine 75 MCG tablet Commonly known as:  SYNTHROID, LEVOTHROID Take 75 mcg by mouth daily before breakfast.  Indication:  Underactive Thyroid   losartan 100 MG tablet Commonly known as:  COZAAR  Take 1 tablet by mouth daily.  Indication:  High Blood Pressure Disorder   NOVOLOG 100 UNIT/ML injection Generic drug:  insulin aspart Inject 1.15-1.25 Units into the skin See admin instructions. Via insulin pump-basal rate: 1.15-1.25 units/hr (every 8 carbs = 1 unit of insulin bolus) 0000-0800 = 1.15 units/hr and 0801/2359 = 1.25 units/hr  Indication:  Insulin-Dependent Diabetes   pantoprazole 40 MG tablet Commonly known as:  PROTONIX Take 1 tablet by mouth 2 (two) times daily.  Indication:  Gastroesophageal Reflux Disease   QUEtiapine 200 MG tablet Commonly known as:  SEROQUEL Take 1 tablet (200 mg total) by mouth at bedtime.  Indication:  Depressive Phase of Manic-Depression   simvastatin 20 MG tablet Commonly known as:  ZOCOR Take 1 tablet by mouth daily.  Indication:  High Amount of Fats in the Blood      Follow-up Information    [facility]. Go on 09/22/2016.   Why:  Please attend your therapy appointment on Friday, 09/22/16, at 10am with Royal Piedra.  Please bring a copy of your hospital discharge paperwork. Contact information: [place], [place], [place] [address] P: [phone] F: [facility]. Go on 10/17/2016.   Specialty:  Behavioral Health Why:  Please attend your medication appointment with Dr. Daron Offer on 10/17/16 at 2:00pm.  Please bring a copy of your hospital discharge paperwork. Contact information: [place] [phone]          Follow-up recommendations:  Activity:  as  tolerated. Diet:  low sodium heart healthy ADA diet. Other:  keep follow up appointments.  Comments:    Signed: Orson Slick, MD 09/19/2016, 11:26 AM

## 2016-09-19 NOTE — Progress Notes (Signed)
  [facility] Adult Case Management Discharge Plan :  Will you be returning to the same living situation after discharge:  Yes,  own home At discharge, do you have transportation home?: Yes,  mother Do you have the ability to pay for your medications: Yes,  own insurance  Release of information consent forms completed and in the chart;  Patient's signature needed at discharge.  Patient to Follow up at: Follow-up Information    [facility]. Go on 09/22/2016.   Why:  Please attend your therapy appointment on Friday, 09/22/16, at 10am with Royal Piedra.  Please bring a copy of your hospital discharge paperwork. Contact information: [place], [place], [place] [address] P: [phone] F: [facility]. Go on 10/17/2016.   Specialty:  Behavioral Health Why:  Please attend your medication appointment with Dr. Daron Offer on 10/17/16 at 2:00pm.  Please bring a copy of your hospital discharge paperwork. Contact information: [place] [phone]          Next level of care provider has access to [place] and Suicide Prevention discussed: Yes,  with mother  Have you used any form of tobacco in the last 30 days? (Cigarettes, Smokeless Tobacco, Cigars, and/or Pipes): No  Has patient been referred to the Quitline?: Patient refused referral  Patient has been referred for addiction treatment: Yes  Joanne Chars, [place] 09/19/2016, 11:36 AM

## 2016-09-19 NOTE — BHH Suicide Risk Assessment (Signed)
[facility] Discharge Suicide Risk Assessment   Principal Problem: Major depressive disorder, recurrent severe without psychotic features [facility]) Discharge Diagnoses:  Patient Active Problem List   Diagnosis Date Noted  . Type 1 diabetes mellitus ([place]) [E10.9] 09/14/2016  . Major depressive disorder, recurrent severe without psychotic features ([place]) [F33.2] 09/13/2016  . Hypothyroidism [E03.9] 09/13/2016  . HTN (hypertension) [I10] 09/13/2016  . Dyslipidemia [E78.5] 09/13/2016  . GERD (gastroesophageal reflux disease) [K21.9] 09/13/2016    Total Time spent with patient: 30 minutes  Musculoskeletal: Strength & Muscle Tone: within normal limits Gait & Station: normal Patient leans: N/A  Psychiatric Specialty Exam: Review of Systems  Psychiatric/Behavioral: The patient is nervous/anxious and has insomnia.   All other systems reviewed and are negative.   Blood pressure (!) 142/74, pulse 68, temperature 98 F (36.7 C), temperature source Oral, resp. rate 18, SpO2 99 %.There is no height or weight on file to calculate BMI.  General Appearance: Casual  Eye Contact::  Good  Speech:  Clear and Coherent409  Volume:  Normal  Mood:  Anxious  Affect:  Appropriate  Thought Process:  Goal Directed and Descriptions of Associations: Intact  Orientation:  Full (Time, Place, and Person)  Thought Content:  WDL  Suicidal Thoughts:  No  Homicidal Thoughts:  No  Memory:  Immediate;   Fair Recent;   Fair Remote;   Fair  Judgement:  Fair  Insight:  Present  Psychomotor Activity:  Normal  Concentration:  Fair  Recall:  [employer] of [place]  Language: Fair  Akathisia:  No  Handed:  Right  AIMS (if indicated):     Assets:  Communication Skills Desire for Improvement Financial Resources/Insurance Housing Resilience Social Support Transportation  Sleep:  Number of Hours: 4.5  Cognition: WNL  ADL's:  Intact   Mental Status Per Nursing Assessment::   On Admission:  NA  Demographic  Factors:  Male, Divorced or widowed, Caucasian and Living alone  Loss Factors: Decline in physical health  Historical Factors: Prior suicide attempts, Family history of mental illness or substance abuse and Impulsivity  Risk Reduction Factors:   Sense of responsibility to family, Positive social support and Positive therapeutic relationship  Continued Clinical Symptoms:  Depression:   Impulsivity Insomnia Unstable or Poor Therapeutic Relationship Previous Psychiatric Diagnoses and Treatments Medical Diagnoses and Treatments/Surgeries  Cognitive Features That Contribute To Risk:  None    Suicide Risk:  Minimal: No identifiable suicidal ideation.  Patients presenting with no risk factors but with morbid ruminations; may be classified as minimal risk based on the severity of the depressive symptoms  Follow-up [facility]. Go on 09/22/2016.   Why:  Please attend your therapy appointment on Friday, 09/22/16, at 10am with Royal Piedra.  Please bring a copy of your hospital discharge paperwork. Contact information: [place], [place], [place] [address] P: [phone] F: [facility]. Go on 10/17/2016.   Specialty:  Behavioral Health Why:  Please attend your medication appointment with Dr. Daron Offer on 10/17/16 at 2:00pm.  Please bring a copy of your hospital discharge paperwork. Contact information: [address] [place] tolerated.ollow-up recommendations:  Activity:  as tolerated. Diet:  low sodium heart healthy ADA diet. Other:  keep follow up appointments.  Orson Slick, MD 09/19/2016, 11:26 AM

## 2016-09-19 NOTE — BHH Group Notes (Signed)
[facility] LCSW Group Therapy Note  Date/Time: 09/19/16, 0930  Type of Therapy/Topic:  Group Therapy:  Feelings about Diagnosis  Participation Level:  Active   Mood: pleasant  Description of Group:    This group will allow patients to explore their thoughts and feelings about diagnoses they have received. Patients will be guided to explore their level of understanding and acceptance of these diagnoses. Facilitator will encourage patients to process their thoughts and feelings about the reactions of others to their diagnosis, and will guide patients in identifying ways to discuss their diagnosis with significant others in their lives. This group will be process-oriented, with patients participating in exploration of their own experiences as well as giving and receiving support and challenge from other group members.   Therapeutic Goals: 1. Patient will demonstrate understanding of diagnosis as evidence by identifying two or more symptoms of the disorder:  2. Patient will be able to express two feelings regarding the diagnosis 3. Patient will demonstrate ability to communicate their needs through discussion and/or role plays  Summary of Patient Progress: PT reported that he was not sure what his diagnosis was, but believed it to be depression.  Pt was able to identify symptoms of depression and was an active participant in the group discussion about stigma and feelings regarding being diagnosed with a mental health disorder.        Therapeutic Modalities:   Cognitive Behavioral Therapy Brief Therapy Feelings Identification   Lurline Idol, LCSW

## 2016-09-19 NOTE — Progress Notes (Signed)
Provided and reviewed discharge paperwork and prescriptions. Verified understanding by use of teach back method. Verbalizes understanding as well. Denies SI/HI/AVH, pain. Belongings to be returned once discharged to include black back pack with insulin pump, pump supplies and trach care supplies. Pt's mother to pick him up once CBG rechecked at 1400 and trending down. Safety maintained with every 15 minute checks. Will continue to monitor.

## 2016-09-19 NOTE — Progress Notes (Signed)
CBG 438, Dr. Bary Leriche informed by this nurse. Give sliding scale insulin and scheduled insulin and recheck CBG in 2 hours. Hold for discharge until recheck to ensure trending down per Dr. Bary Leriche. Will recheck CBG in 2 hours. Pt aware. Safety maintained. Will continue to monitor.

## 2016-09-19 NOTE — Plan of Care (Signed)
Problem: [facility] Participation in Recreation Therapeutic Interventions Goal: STG-Patient will demonstrate improved self esteem by identif STG: Self-Esteem - Within 4 treatment sessions, patient will verbalize at least 5 positive affirmation statements in each of 2 treatment sessions to increase self-esteem.  Outcome: Completed/Met Date Met: 09/19/16 Treatment Session 2; Completed 2 out of 2: At approximately 10:50 am, LRT met with patient in patient room. Patient verbalized 5 positive affirmation statements. Patient reported it felt "real good". LRT encouraged patient to continue saying positive affirmation statements.  Leonette Monarch, LRT/CTRS 05.15.18 1:23 pm Goal: STG-Other Recreation Therapy Goal (Specify) STG: Stress Management - Within 4 treatment sessions, patient will verbalize understanding of the stress management techniques in each of 2 treatment sessions to increase stress management skills.  Outcome: Completed/Met Date Met: 09/19/16 Treatment Session 2; Completed 2 out of 2: At approximately 10:50 am, LRT met with patient in patient room. Patient reported he read over and practiced the stress management techniques. Patient verbalized understanding and reported the techniques were helpful. LRT encouraged patient to continue practicing the stress management techniques.  Leonette Monarch, LRT/CTRS 05.15.18 1:28 pm

## 2016-09-19 NOTE — Progress Notes (Signed)
Recreation Therapy Notes  INPATIENT RECREATION TR PLAN  Patient Details Name: [name] MRN: [id number] DOB: 08/30/1960 Today's Date: 09/19/2016  Rec Therapy Plan Is patient appropriate for Therapeutic Recreation?: Yes Treatment times per week: At least once a week TR Treatment/Interventions: 1:1 session, Group participation (Comment) (Appropriate participation in daily recreational therapy tx)  Discharge Criteria Pt will be discharged from therapy if:: Treatment goals are met, Discharged Treatment plan/goals/alternatives discussed and agreed upon by:: Patient/family  Discharge Summary Short term goals set: See Care Plan Short term goals met: Complete Progress toward goals comments: One-to-one attended Which groups?: Leisure education, Social skills One-to-one attended: Self-esteem, stress management Reason goals not met: N/A Therapeutic equipment acquired: None Reason patient discharged from therapy: Discharge from hospital Pt/family agrees with progress & goals achieved: Yes Date patient discharged from therapy: 09/19/16   Leonette Monarch, LRT/CTRS 09/19/2016, 4:47 PM

## 2016-09-22 ENCOUNTER — Ambulatory Visit (INDEPENDENT_AMBULATORY_CARE_PROVIDER_SITE_OTHER): Payer: PPO | Admitting: Licensed Clinical Social Worker

## 2016-09-22 DIAGNOSIS — F331 Major depressive disorder, recurrent, moderate: Secondary | ICD-10-CM
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
## 2016-09-25 ENCOUNTER — Telehealth: Payer: Self-pay | Admitting: Dietician

## 2016-09-25 NOTE — Telephone Encounter (Signed)
Have not heard from pt-called pt-no answer-left message on voice mail for pt to call me back

## 2016-09-26 ENCOUNTER — Telehealth: Payer: Self-pay | Admitting: Licensed Clinical Social Worker

## 2016-09-26 NOTE — Telephone Encounter (Signed)
Left message on voicemail to return phone call.

## 2016-09-28 DIAGNOSIS — Z9002 Acquired absence of larynx: Secondary | ICD-10-CM | POA: Diagnosis not present

## 2016-09-28 DIAGNOSIS — Z448 Encounter for fitting and adjustment of other external prosthetic devices: Secondary | ICD-10-CM | POA: Diagnosis not present

## 2016-10-09 ENCOUNTER — Telehealth: Payer: Self-pay | Admitting: Pharmacy Technician

## 2016-10-09 DIAGNOSIS — Z930 Tracheostomy status: Secondary | ICD-10-CM | POA: Diagnosis not present

## 2016-10-09 NOTE — Telephone Encounter (Signed)
Clemetine Marker, Mother of Patient and Healthcare POA, seeking medication assistance at [facility] for her son, [name].  [facility] unable to provide medication assistance at this point.  [facility] obtains generic medications from [facility].  [facility] restricts the use of their medications for uninsured patient.  Therefore, [facility] medications cannot be used for Medicare patients.   Ms. Kennan stated that it was not quite as hard for the patient to obtain the generic medications.  Obtaining the Novolog from our program was the main goal.  Novolog comes from [employer].  [employer] requires that Medicare patients spend $1,000 out of pocket before they provide medication assistance.  According to April 2018 EOB, patient has only spent $277.65.    Explained to Ms. Matthis that [facility] would be unable to provide medication assistance with Novolog until patient has spent $1,000 out-of-pocket.  Ms. Manna acknowledged that she understood.  Suggested Ms. Broyles reach out to patient's provider to try to obtain samples.  Also, suggested that patient apply for L.I.S.    According to Ms.Cardosa, she will contact [facility] once patient has spent the $1,000 out-of-pocket.  [facility]

## 2016-10-17 ENCOUNTER — Encounter (HOSPITAL_COMMUNITY): Payer: Self-pay | Admitting: Psychiatry

## 2016-10-17 ENCOUNTER — Encounter: Payer: Self-pay | Admitting: Dietician

## 2016-10-17 ENCOUNTER — Ambulatory Visit (INDEPENDENT_AMBULATORY_CARE_PROVIDER_SITE_OTHER): Payer: PPO | Admitting: Psychiatry

## 2016-10-17 VITALS — BP 124/78 | HR 88 | Ht 72.0 in | Wt 242.6 lb

## 2016-10-17 DIAGNOSIS — F17210 Nicotine dependence, cigarettes, uncomplicated: Secondary | ICD-10-CM

## 2016-10-17 DIAGNOSIS — F331 Major depressive disorder, recurrent, moderate: Secondary | ICD-10-CM | POA: Diagnosis not present

## 2016-10-17 DIAGNOSIS — Z818 Family history of other mental and behavioral disorders: Secondary | ICD-10-CM

## 2016-10-17 MED ORDER — QUETIAPINE FUMARATE 200 MG PO TABS: 200.0000 mg | ORAL_TABLET | Freq: Every day | ORAL | 1 refills | Status: DC

## 2016-10-17 MED ORDER — FLUVOXAMINE MALEATE 50 MG PO TABS: 150.0000 mg | ORAL_TABLET | Freq: Every day | ORAL | 1 refills | Status: DC

## 2016-10-17 NOTE — Progress Notes (Signed)
Psychiatric Initial Adult Assessment   Patient Identification: [name] MRN:  [id number] Date of Evaluation:  10/17/2016 Referral Source: inpatient hospital Chief Complaint:  depression Visit Diagnosis:    ICD-10-CM   1. Moderate episode of recurrent major depressive disorder (HCC) F33.1 QUEtiapine (SEROQUEL) 200 MG tablet    fluvoxaMINE (LUVOX) 50 MG tablet    fluvoxaMINE (LUVOX) 50 MG tablet    QUEtiapine (SEROQUEL) 200 MG tablet    History of Present Illness:  [name] is a 56 year old male with a history of depression and anxiety, recently admitted to the psychiatric hospital in [place] with suicidal thoughts and auditory hallucinations to hurt himself. He was initiated on Seroquel, and Luvox, and discontinued off of Depakote and BuSpar.  He presents today for establishing psychiatric care. He reports that the current medication regimen of Luvox 150 mg and Seroquel 200 mg seems to be working well for him. His only complaint is that he is a little bit tired during the day, but he is getting used to the medicine. He denies any ongoing suicidal thoughts, and feels hopeful that his mood can improve.  Spent time reviewing his psychiatric history and long standing history of major depressive disorder. He has had 3 psychiatric hospitalizations for major depressive disorder. He has been tried on Lexapro, Celexa, Zoloft, Luvox, Depakote, BuSpar, Seroquel.  He has never had any episodes consistent with mania, no discrete episodes of psychosis.  He denies any history of significant trauma. He admits that he used to struggle with substance abuse a few years ago, but denies any significant alcohol or drug use now. 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    He agrees to establish therapy care in this  clinic, and follow-up with this writer in 6 weeks. We agreed to continue the current medication regimen as it does appear to be effective for him. I also provided him with reading material on ECT, if his depressive symptoms should worsen, I believe it would be appropriate to consider this treatment.  Reviewed NCCSD, appropriate  Associated Signs/Symptoms: Depression Symptoms:  fatigue, (Hypo) Manic Symptoms:  none Anxiety Symptoms:  Social Anxiety, Psychotic Symptoms:  none PTSD Symptoms: Negative  Past Psychiatric History: 3 psychiatric hospitalizations. Prior history of overdose on opiates, and prior history of overdose on Remeron  Previous Psychotropic Medications: Yes   Substance Abuse History in the last 12 months:  No.  Consequences of Substance Abuse: Negative  Past Medical History:  Past Medical History:  Diagnosis Date  . Depression   . Diabetes (HCC)    Insulin Pump  . Diabetes mellitus type I ([place])   . Diabetes mellitus without complication ([place])   . GERD (gastroesophageal reflux disease)   . H/O laryngectomy   . Hyperlipidemia   . Hypertension   . Suicide attempt [facility]) 2014   damaged larynx - tracheostomy  . Thyroid disease     Past Surgical History:  Procedure Laterality Date  . LARYNGECTOMY    . TRACHEOSTOMY  2014   from [place] attempt    Family Psychiatric History: Family history of depression. No family history of completed suicide  Family History: History reviewed. No pertinent family history.  Social History:   Social History   Social History  . Marital status: Single    Spouse name:  N/A  . Number of children: N/A  . Years of education: N/A   Social History Main Topics  . Smoking status: Former Smoker    Packs/day: 0.00    Types: Cigarettes    Quit date: 04/09/2013  . Smokeless tobacco: Never Used  . Alcohol use No     Comment: Pt denied; BAC not available  . Drug use: No     Comment: Pt denied; UDS not available  . Sexual activity: No    Other Topics Concern  . None   Social History Narrative  . None    Additional Social History: Currently on disability, used to work as a maintenance  Allergies:   Allergies  Allergen Reactions  . Buspar [Buspirone]     Makes the patient "flip out"  . Depakote [Valproic Acid]     Causes excessive drowsiness  . Clopidogrel Rash  . Gabapentin Itching and Rash    Metabolic Disorder Labs: Lab Results  Component Value Date   HGBA1C 8.1 (H) 09/15/2016   MPG 186 09/15/2016   No results found for: PROLACTIN Lab Results  Component Value Date   CHOL 161 09/15/2016   TRIG 190 (H) 09/15/2016   HDL 41 09/15/2016   CHOLHDL 3.9 09/15/2016   VLDL 38 09/15/2016   LDLCALC 82 09/15/2016     Current Medications: Current Outpatient Prescriptions  Medication Sig Dispense Refill  . amLODipine (NORVASC) 5 MG tablet Take 5 mg by mouth daily.    Marland Kitchen aspirin EC 81 MG tablet Take 1 tablet by mouth daily.    Marland Kitchen Fexofenadine HCl (ALLEGRA PO) Take 180 mg by mouth daily.     . fluticasone (FLONASE) 50 MCG/ACT nasal spray Place 2 sprays into both nostrils daily.    . fluvoxaMINE (LUVOX) 50 MG tablet Take 3 tablets (150 mg total) by mouth at bedtime. 90 tablet 1  . fluvoxaMINE (LUVOX) 50 MG tablet Take 3 tablets (150 mg total) by mouth at bedtime. 90 tablet 1  . hydrochlorothiazide (HYDRODIURIL) 12.5 MG tablet Take 12.5 mg by mouth daily.    . insulin aspart (NOVOLOG) 100 UNIT/ML injection Inject 1.15-1.25 Units into the skin See admin instructions. Via insulin pump-basal rate: 1.15-1.25 units/hr (every 8 carbs = 1 unit of insulin bolus) 0000-0800 = 1.15 units/hr and 0801/2359 = 1.25 units/hr    . levothyroxine (SYNTHROID, LEVOTHROID) 75 MCG tablet Take 75 mcg by mouth daily before breakfast.    . losartan (COZAAR) 100 MG tablet Take 1 tablet by mouth daily.    . pantoprazole (PROTONIX) 40 MG tablet Take 1 tablet by mouth 2 (two) times daily.    . QUEtiapine (SEROQUEL) 200 MG tablet Take 1 tablet  (200 mg total) by mouth at bedtime. 30 tablet 1  . QUEtiapine (SEROQUEL) 200 MG tablet Take 1 tablet (200 mg total) by mouth at bedtime. 30 tablet 1  . simvastatin (ZOCOR) 20 MG tablet Take 1 tablet by mouth daily.     No current facility-administered medications for this visit.    Facility-Administered Medications Ordered in Other Visits  Medication Dose Route Frequency Provider Last Rate Last Dose  . acetaminophen (TYLENOL) tablet 650 mg  650 mg Oral Q6H PRN Pucilowska, Jolanta B, MD      . alum & mag hydroxide-simeth (MAALOX/MYLANTA) 200-200-20 MG/5ML suspension 30 mL  30 mL Oral Q4H PRN Pucilowska, Jolanta B, MD      . fluticasone (FLONASE) 50 MCG/ACT nasal spray 2 spray  2 spray Each Nare Daily Pucilowska, Jolanta B, MD      .  loratadine (CLARITIN) tablet 10 mg  10 mg Oral Daily Pucilowska, Jolanta B, MD      . magnesium hydroxide (MILK OF MAGNESIA) suspension 30 mL  30 mL Oral Daily PRN Pucilowska, Jolanta B, MD        Neurologic: Headache: Negative Seizure: Negative Paresthesias:Negative  Musculoskeletal: Strength & Muscle Tone: within normal limits Gait & Station: normal Patient leans: N/A  Psychiatric Specialty Exam: Review of Systems  Constitutional: Negative.   HENT: Negative.   Respiratory: Negative.   Cardiovascular: Negative.   Gastrointestinal: Negative.   Musculoskeletal: Negative.   Neurological: Negative.   Psychiatric/Behavioral: Positive for depression.       Improving    Blood pressure 124/78, pulse 88, height 6' (1.829 m), weight 242 lb 9.6 oz (110 kg).Body mass index is 32.9 kg/m.  General Appearance: Casual and Fairly Groomed  Eye Contact:  Good  Speech:  Garbled and Due to laryngeal tube, trach collar  Volume:  Normal  Mood:  Euthymic  Affect:  Congruent  Thought Process:  Goal Directed  Orientation:  Full (Time, Place, and Person)  Thought Content:  Logical  Suicidal Thoughts:  No  Homicidal Thoughts:  No  Memory:  Immediate;   Fair   Judgement:  Fair  Insight:  Fair  Psychomotor Activity:  Normal  Concentration:  Concentration: Fair  Recall:  NA  Fund of Knowledge:Good  Language: Good  Akathisia:  NA  Handed:  Right  AIMS (if indicated):  0  Assets:  Communication Skills Desire for [place] Talents/Skills Transportation  ADL's:  Intact  Cognition: WNL  Sleep:  7-9 hours with seroquel    Treatment Plan Summary: [name] is a 56 year old male with a history of major depressive disorder, with medical issues of hypothyroidism, hypertension, dyslipidemia, and type 1 diabetes.  He presents today to establish psychiatric care after a recent hospitalization at [facility] for depression and suicidality.  He has at least 2 prior suicide attempts, and 3 prior psychiatric hospitalizations. He appears to be doing fairly well on his regimen of Luvox and Seroquel, and we will help them today to establish therapy follow-up. He denies any acute safety issues, and reports that he has a pretty good support system from his mom and daughter. He agrees to follow-up in 6 weeks with this Probation officer, and will arrange for therapy intake in this office.  1. Moderate episode of recurrent major depressive disorder (HCC)    Continue Seroquel 200 mg nightly Continue Luvox 150 mg nightly Return to clinic in 6 weeks The intake at this office  Aundra Dubin, MD 6/12/20182:56 PM

## 2016-10-18 ENCOUNTER — Ambulatory Visit: Payer: Self-pay | Admitting: Pharmacist

## 2016-10-18 ENCOUNTER — Other Ambulatory Visit: Payer: Self-pay | Admitting: Pharmacist

## 2016-10-18 NOTE — Patient Outreach (Signed)
[facility]) Care Management  10/18/2016  [name] Aug 22, 1960 [id number]  56 year old male referred to [place] by HTA concierge services for medication assistance.  PMHx includes but not limited to: Depression, anxiety, Type 1 diabetes on insulin pump, HTN, HLD, hypothyroidism, hx tracheostomy and recent hospitalization for suicidal ideation.    Unsuccessful outreach attempt to patient today.  Left HIPAA compliant voicemail.  Will try calling patient again within the next week.   Ralene Bathe, PharmD, [place] [phone]

## 2016-10-20 ENCOUNTER — Other Ambulatory Visit: Payer: Self-pay | Admitting: Pharmacist

## 2016-10-20 NOTE — Patient Outreach (Signed)
[facility]) Care Management  10/20/2016  Sherley Leser 08-04-1960 [id number]   Received telephone call from patient's mother.  HIPAA identifiers verified. Mother completed telephonic medication reconciliation. She states her son is now in the coverage gap and requested assistance with his insulin as well as laryngectomy and pump reservoir supplies.  She confirmed patient has HTA insurance and is currently in the coverage gap.  She has also reached out to [facility] and [facility] medication assistance for help with her son's medical and prescription bills.  Patient has applied for [employer] LIS on 10/11/2016 and is waiting to hear back for approval / denial.  He has spent ~$275 out-of-pocket for the year so far therefore is not eligible yet for patient assistance programs for Humalog or Novolog as these require > $1000 out-of-pocket spend.  Patient picked up 90 day supply of insulin 10/09/2016.  We discussed that patient might only have to pay one larger copay amount in September for a 90 day supply and then could fill a 30 day supply in December before he starts back into the initial coverage phase again.  Prior to filling prescription in September, patient should receive approval or denial from [employer] which if he is approved, will lower his copay amount.  Mother expressed understanding.  She is aware she can call me in the future if any other questions arise.   [facility] pharmacy will close case at this time but am happy to help again as needed.   Ralene Bathe, PharmD, [place] [phone]

## 2016-10-24 DIAGNOSIS — E10649 Type 1 diabetes mellitus with hypoglycemia without coma: Secondary | ICD-10-CM | POA: Diagnosis not present

## 2016-10-24 DIAGNOSIS — E785 Hyperlipidemia, unspecified: Secondary | ICD-10-CM | POA: Diagnosis not present

## 2016-10-24 DIAGNOSIS — E781 Pure hyperglyceridemia: Secondary | ICD-10-CM | POA: Diagnosis not present

## 2016-10-24 DIAGNOSIS — E039 Hypothyroidism, unspecified: Secondary | ICD-10-CM | POA: Diagnosis not present

## 2016-10-24 DIAGNOSIS — E1065 Type 1 diabetes mellitus with hyperglycemia: Secondary | ICD-10-CM | POA: Diagnosis not present

## 2016-10-24 DIAGNOSIS — E559 Vitamin D deficiency, unspecified: Secondary | ICD-10-CM | POA: Diagnosis not present

## 2016-10-24 DIAGNOSIS — E1029 Type 1 diabetes mellitus with other diabetic kidney complication: Secondary | ICD-10-CM | POA: Diagnosis not present

## 2016-10-24 DIAGNOSIS — Z4681 Encounter for fitting and adjustment of insulin pump: Secondary | ICD-10-CM | POA: Diagnosis not present

## 2016-10-24 DIAGNOSIS — R809 Proteinuria, unspecified: Secondary | ICD-10-CM | POA: Diagnosis not present

## 2016-10-24 DIAGNOSIS — F339 Major depressive disorder, recurrent, unspecified: Secondary | ICD-10-CM | POA: Diagnosis not present

## 2016-10-25 ENCOUNTER — Ambulatory Visit: Payer: Self-pay | Admitting: Pharmacist

## 2016-11-01 ENCOUNTER — Other Ambulatory Visit (HOSPITAL_COMMUNITY): Payer: Self-pay

## 2016-11-01 DIAGNOSIS — F331 Major depressive disorder, recurrent, moderate: Secondary | ICD-10-CM

## 2016-11-01 MED ORDER — QUETIAPINE FUMARATE 200 MG PO TABS: 200.0000 mg | ORAL_TABLET | Freq: Every day | ORAL | 0 refills | Status: DC

## 2016-11-01 MED ORDER — FLUVOXAMINE MALEATE 50 MG PO TABS: 150.0000 mg | ORAL_TABLET | Freq: Every day | ORAL | 0 refills | Status: DC

## 2016-11-01 NOTE — Progress Notes (Signed)
Patient called and his insurance will only pay for a 90 day supply. I resent the prescriptions as a 90 day

## 2016-11-07 ENCOUNTER — Encounter (HOSPITAL_COMMUNITY): Payer: Self-pay | Admitting: Licensed Clinical Social Worker

## 2016-11-07 ENCOUNTER — Ambulatory Visit (INDEPENDENT_AMBULATORY_CARE_PROVIDER_SITE_OTHER): Payer: PPO | Admitting: Licensed Clinical Social Worker

## 2016-11-07 DIAGNOSIS — F331 Major depressive disorder, recurrent, moderate: Secondary | ICD-10-CM | POA: Diagnosis not present

## 2016-11-07 NOTE — Progress Notes (Signed)
   THERAPIST PROGRESS NOTE  Session Time: 3:15-4:05  Participation Level: Active  Behavioral Response: CasualAlertAnxious  Type of Therapy: Individual Therapy  Treatment Goals addressed: Anxiety/Depression  Interventions: Motivational Interviewing  Summary: [name] is a 56 y.o. male. He is referred for individual therapy by Dr. Sharyon Medicus. Pt was in [facility]. He saw Royal Piedra in [place] but had a hard time getting a new appt with her. Today is the first day of therapy with pt so spent a considerable amount of time determining the therapeutic needs of the patient and getting background information. Pt is interested in an outpatient group to learn new coping skills for his recurrent symptoms of depression. 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 He is on disability but has a desire to work somewhere part time to help financially. He is very close (co-dependent?) with his mother. He has 4 children which he reports he has a good relationship with.  Pt reports he learned quite a bit while inpt [facility] in [place] and would like to build upon those skills. Had pt meet with Audry Pili who facilitates an OP group in the evening to discuss the program.    Suicidal/Homicidal: Nowithout intent/plan  Therapist Response: Assessed pt's current functioning. Assisted pt in determining the appropriate therapeutic needs of pt.  Plan:  Pt is to begin the OP evening program Monday 7/9.  Diagnosis: Axis I: Moderate episode of recurrent major depression      Malaisha Silliman S, LCAS 11/07/2016

## 2016-11-09 DIAGNOSIS — E103293 Type 1 diabetes mellitus with mild nonproliferative diabetic retinopathy without macular edema, bilateral: Secondary | ICD-10-CM | POA: Diagnosis not present

## 2016-11-13 ENCOUNTER — Ambulatory Visit (INDEPENDENT_AMBULATORY_CARE_PROVIDER_SITE_OTHER): Payer: PPO | Admitting: Licensed Clinical Social Worker

## 2016-11-13 DIAGNOSIS — F331 Major depressive disorder, recurrent, moderate: Secondary | ICD-10-CM

## 2016-11-14 ENCOUNTER — Encounter (HOSPITAL_COMMUNITY): Payer: Self-pay | Admitting: Licensed Clinical Social Worker

## 2016-11-14 NOTE — Progress Notes (Signed)
OP SUPPORT GROUP THERAPY  THERAPIST PROGRESS NOTE  Session Time: 5:30-6:30pm  Participation Level: Active  Behavioral Response: Casual and NeatAlertEuthymic  Type of Therapy: Psycho-education Group  Treatment Goals addressed: Diagnosis: MDD  Interventions: CBT and Psychosocial Skills: Goal setting  Summary: [name] is a 56 y.o. 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 Pt states he recently went to the hospital for severe depressive symptoms. Pt reports he "could not get out of bed" and felt completely "hopeless". Pt did not give specific reasons for his depression. He states he "has always felt a little bit different from people". He reports he is looking for coping skills and a place to meet people and practice some new healthy social behaviors. Pt was active and engaged during session. He was warm, friendly and appeared to connect to group quickly.  Suicidal/Homicidal: Nowithout intent/plan  Therapist Response: Counselor led psychoed session on "fear setting" as a replacement for goal setting. A TED talk was watched and discussed. Pt's shared openly and stated the video helped them think about new strategies for facing their problems.  Plan: Return again in 1 weeks.  Diagnosis:    ICD-10-CM   1. Moderate episode of recurrent major depressive disorder [facility]) F33.1        Archie Balboa, LPCA 11/14/2016

## 2016-11-20 ENCOUNTER — Ambulatory Visit (INDEPENDENT_AMBULATORY_CARE_PROVIDER_SITE_OTHER): Payer: PPO | Admitting: Licensed Clinical Social Worker

## 2016-11-20 ENCOUNTER — Encounter (HOSPITAL_COMMUNITY): Payer: Self-pay | Admitting: Licensed Clinical Social Worker

## 2016-11-20 DIAGNOSIS — F331 Major depressive disorder, recurrent, moderate: Secondary | ICD-10-CM | POA: Diagnosis not present

## 2016-11-20 NOTE — Progress Notes (Signed)
   THERAPIST PROGRESS NOTE  Session Time: 5-6pm  Participation Level: Active  Behavioral Response: Casual, Meticulous and NeatAlertEuthymic  Type of Therapy: Individual Therapy  Treatment Goals addressed: Diagnosis: MDD  Interventions: CBT, Strength-based, Supportive and Reframing  Summary: [name] is a 56 y.o. male who presents seeking help for symptoms of depression including inability to sleep, hopeless, and irrational internal comparison towards his "old self". Pt admits that he struggles everyday to live with his voicebox, which changed his ability to do nearly all physical activity. 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 Pt expressed regret for overdosing since "all I wanted to do was ease the emotional pain I was in". Pt discusses his current habit of comparing his abilities to his old self. He expresses frustration for not being able to swim, hike, or work out the way he used to. He also expresses remorse for gaining weight. He states he spent all week "just staying up till 3-4am watching TV then falling asleep till 11am or 12pm. He states he wants to return to a normal sleeping pattern but feels like "an insomniac". Pt states he has a TV in his room but he has tried to stop watching it 1 hr before sleep which did not help. He reports he drinks chamomile tea at night and takes his medications as px which do not help him sleep. Counselor and pt then discuss ways for pt to foster self efficacy and set realistic goals for himself as he continues to adjust to his voicebox.   Suicidal/Homicidal: Nowithout intent/plan  Therapist Response: PT appears fixated on comparing himself to his past abilities which contribute to a high degree of frustration, ineffectiveness, and irritability. He admits he feels hopeless when he goes to the gym and can not work out the way he  used to. Counselor used Cognitive behavioral techniques to help pt establish realistic goals, combat irrational thinking, and develop a behavioral action plan for accomplishing reasonable tasks with each day. Pt appears motivated but admits he is struggling to "put things into action".   Plan: Return again in 1 weeks.  Diagnosis:    ICD-10-CM   1. Moderate episode of recurrent major depressive disorder [facility]) F33.1        Archie Balboa, LPCA 11/20/2016

## 2016-11-21 ENCOUNTER — Ambulatory Visit: Payer: Self-pay | Admitting: Pharmacy Technician

## 2016-11-21 ENCOUNTER — Ambulatory Visit: Payer: PPO | Admitting: Pharmacy Technician

## 2016-11-21 ENCOUNTER — Encounter: Payer: Self-pay | Admitting: Pharmacist

## 2016-11-21 DIAGNOSIS — Z79899 Other long term (current) drug therapy: Secondary | ICD-10-CM

## 2016-11-21 NOTE — Progress Notes (Signed)
[name] is a 56 year old male in the coverage gap and wishes to obtain medications from [facility]). He is currently enrolled into the [employer] plan. Patient has been obtaining prescriptions from [facility].  The objectives of this visit are to reconcile the patient's medication profile, assess which medications [facility] can assist with, and assess his knowledge and compliance of his medications.  When prompted with the drug name the patient knew the medication/indication/frequency. Disease Management was briefly discussed.  [facility] is able to provide assistance with the Novolog insulin for his pump. The patient has reached the coverage gap and spent $1000 out-of-pocket as required per [employer]'s patient assistance program. The patient does not need assistance at this time with his generic medications as these continue to be covered during the coverage gap for $4.00 each at [facility].  The patient is using approximately 29 basal units per Holder of Novolog. He was not able to calculate his total daily insulin (basal plus bolus) at this visit. He does use 1 unit of insulin per 8 gm of carbohydrates.   [facility] will order 4 vials per month from [place] patient assistance program and adjust as needed.  The first shipment will take approximately 4 weeks to arrive. [facility] will provide samples from our pharmacy if available.  Prior to Admission medications   Medication Sig Start Date End Date Taking? Authorizing Provider  amLODipine (NORVASC) 5 MG tablet Take 5 mg by mouth daily.   Yes [provider]  aspirin EC 81 MG tablet Take 1 tablet by mouth daily.   Yes [provider]  fluticasone (FLONASE) 50 MCG/ACT nasal spray Place 2 sprays into both nostrils daily.   Yes [provider]  fluvoxaMINE (LUVOX) 50 MG tablet Take 3 tablets (150 mg total) by mouth at bedtime. 11/01/16  Yes Eksir,  Richard Miu, MD  hydrochlorothiazide (HYDRODIURIL) 12.5 MG tablet Take 12.5 mg by mouth daily. 02/15/15  Yes [provider]  insulin aspart (NOVOLOG) 100 UNIT/ML injection Inject 1.15-1.25 Units into the skin See admin instructions. Via insulin pump-basal rate: 1.15-1.25 units/hr (every 8 carbs = 1 unit of insulin bolus) 0000-0800 = 1.15 units/hr and 0801/2359 = 1.25 units/hr 06/26/16  Yes [provider]  levothyroxine (SYNTHROID, LEVOTHROID) 75 MCG tablet Take 75 mcg by mouth daily before breakfast.   Yes [provider]  losartan (COZAAR) 100 MG tablet Take 1 tablet by mouth daily. 04/26/16  Yes [provider]  pantoprazole (PROTONIX) 40 MG tablet Take 1 tablet by mouth 2 (two) times daily. Taking one tablet daily 05/09/16  Yes [provider]  QUEtiapine (SEROQUEL) 200 MG tablet Take 1 tablet (200 mg total) by mouth at bedtime. Patient taking differently: Take 100 mg by mouth at bedtime.  11/01/16  Yes Eksir, Richard Miu, MD  simvastatin (ZOCOR) 20 MG tablet Take 1 tablet by mouth every evening.    Yes [provider]  Fexofenadine HCl (ALLEGRA PO) Take 180 mg by mouth daily.     [provider]   Cleopatra Cedar. Dicky Doe, PharmD [facility] [place] Operations Coordinator [phone]

## 2016-11-22 NOTE — Progress Notes (Signed)
  Completed [facility] application and contract.  Patient agreed to all terms of the [facility] contract.  Patient to provide updated pharmacy printout showing out-of-pocket expense of $1,000.   [facility] will provide medication assistance while patient is in coverage gap through 05/07/2017.  Patient acknowledged and understood that medication assistance from [facility] will end on May 07, 2017, and he will be expected to use his Medicare Part D plan to obtain medications beginning May 08, 2017.  Provided patient with community resources based on his particular needs.  Novolog PAP application completed with patient.  Will be submitted to [employer] once signed by Dr. Eddie Dibbles and patient provides updated pharmacy printout showing out-of-pocket expenses totaling $1,000.  [facility]

## 2016-11-27 ENCOUNTER — Ambulatory Visit (INDEPENDENT_AMBULATORY_CARE_PROVIDER_SITE_OTHER): Payer: PPO | Admitting: Licensed Clinical Social Worker

## 2016-11-27 DIAGNOSIS — F331 Major depressive disorder, recurrent, moderate: Secondary | ICD-10-CM | POA: Diagnosis not present

## 2016-11-29 ENCOUNTER — Encounter (HOSPITAL_COMMUNITY): Payer: Self-pay | Admitting: Licensed Clinical Social Worker

## 2016-11-29 NOTE — Progress Notes (Signed)
    Weekly Skills Group Progress Note  Program: Outpatient Group  Group Time: 5:30-6:30  Participation Level: Active  Behavioral Response: Appropriate and Sharing  Type of Therapy:  Psycho-education Group Group Counselor led 20 min session on meditation, mindfulness, and how to do a 3-5 min body scan.    Summary of Progress: Pt presented for his 3rd group session. He reported he continues to struggle with depression keeping him at home and sleeping late into the morning. He reported he is working towards setting more reasonable goals (given his medical condition) for exercise and activity daily. Pt was open and sharing with new group member. Pt was cooperative during meditation but admitted his thoughts are "very loud and distracting when trying to meditate".     [name], LPCA

## 2016-11-30 ENCOUNTER — Encounter (HOSPITAL_COMMUNITY): Payer: Self-pay | Admitting: Psychiatry

## 2016-11-30 ENCOUNTER — Ambulatory Visit (INDEPENDENT_AMBULATORY_CARE_PROVIDER_SITE_OTHER): Payer: PPO | Admitting: Psychiatry

## 2016-11-30 DIAGNOSIS — Z87891 Personal history of nicotine dependence: Secondary | ICD-10-CM | POA: Diagnosis not present

## 2016-11-30 DIAGNOSIS — F331 Major depressive disorder, recurrent, moderate: Secondary | ICD-10-CM | POA: Diagnosis not present

## 2016-11-30 MED ORDER — ARIPIPRAZOLE 2 MG PO TABS: 2.0000 mg | ORAL_TABLET | Freq: Every day | ORAL | 1 refills | Status: DC

## 2016-11-30 MED ORDER — TRAZODONE HCL 100 MG PO TABS: 100.0000 mg | ORAL_TABLET | Freq: Every day | ORAL | 1 refills | Status: DC

## 2016-11-30 MED ORDER — FLUVOXAMINE MALEATE 100 MG PO TABS: 200.0000 mg | ORAL_TABLET | Freq: Every day | ORAL | 1 refills | Status: DC

## 2016-11-30 NOTE — Patient Instructions (Signed)
STOP Seroquel  START Abilify 2 mg in the morning  Increase luvox to 200 mg every day (you can do 100 mg in the morning and 100 mg at night)  Start Trazodone 100 mg at night for sleep  Come back in 2 months or sooner if needed

## 2016-11-30 NOTE — Progress Notes (Signed)
BH MD/PA/NP OP Progress Note  11/30/2016 4:29 PM [name]  MRN:  [id number]  Chief Complaint: intake follow-up, med check  Subjective:  [name] presents today for med management follow-up. He is establish therapy follow-up in this clinic with [facility]. He reports that he has gained around 13 pounds from being on Seroquel the past month, and we reviewed his record together and this does appear accurate. He reports that he is now taking Seroquel 100 mg nightly, so that he could start tapering off of it. He reports that it was not continuing to provide him any benefit for sleep anyways, so he wanted to discontinue it. We spent time discussing sleep hygiene and CBT for insomnia techniques. Agreed to start trazodone 100 mg nightly and reviewed the risks and benefits, including the risk of priapism.  Regarding his mood, he reports that he has had some decline in his mood, but does not feel as bad as he was when he was in the hospital. We discussed increasing Luvox to 100 mg twice daily, for an optimal antidepressant dose. We also discussed switching him off of Seroquel given the weight gain, and initiating Abilify 2 mg daily as an augmenting dose for his antidepressant regimen. I reviewed the risks and benefits of atypical antipsychotic, and the potential for akathisia.  Spent time discussing individual therapy and how he feels this has been for him. He feels that this is been much more valuable for him then group therapy, so I encouraged him to continue in individual therapy. He displays good insight into his need to continue working on his grief. We agreed to follow-up in 6-8 weeks for med check.  He denies any acute safety issues at this time and has no other further questions or concerns.  Visit Diagnosis:    ICD-10-CM   1. Moderate episode of recurrent major depressive disorder (HCC) F33.1 ARIPiprazole (ABILIFY) 2 MG tablet    fluvoxaMINE (LUVOX) 100 MG tablet    traZODone (DESYREL)  100 MG tablet    Past Psychiatric History: .See intake H&P for full details. Reviewed, with no updates at this time.   Past Medical History:  Past Medical History:  Diagnosis Date  . Depression   . Diabetes (HCC)    Insulin Pump  . Diabetes mellitus type I ([place])   . Diabetes mellitus without complication ([place])   . GERD (gastroesophageal reflux disease)   . H/O laryngectomy   . Hyperlipidemia   . Hypertension   . Suicide attempt [facility]) 2014   damaged larynx - tracheostomy  . Thyroid disease     Past Surgical History:  Procedure Laterality Date  . LARYNGECTOMY    . TRACHEOSTOMY  2014   from [place] attempt    Family Psychiatric History: See intake H&P for full details. Reviewed, with no updates at this time.   Family History: History reviewed. No pertinent family history.  Social History:  Social History   Social History  . Marital status: Single    Spouse name: N/A  . Number of children: N/A  . Years of education: N/A   Social History Main Topics  . Smoking status: Former Smoker    Packs/day: 0.00    Types: Cigarettes    Quit date: 04/09/2013  . Smokeless tobacco: Never Used  . Alcohol use No     Comment: Pt denied; BAC not available  . Drug use: No     Comment: Pt denied; UDS not available  . Sexual activity: No  Other Topics Concern  . None   Social History Narrative  . None    Allergies:  Allergies  Allergen Reactions  . Buspar [Buspirone]     Makes the patient "flip out"  . Depakote [Valproic Acid]     Causes excessive drowsiness  . Clopidogrel Rash  . Gabapentin Itching and Rash    Metabolic Disorder Labs: Lab Results  Component Value Date   HGBA1C 8.1 (H) 09/15/2016   MPG 186 09/15/2016   No results found for: PROLACTIN Lab Results  Component Value Date   CHOL 161 09/15/2016   TRIG 190 (H) 09/15/2016   HDL 41 09/15/2016   CHOLHDL 3.9 09/15/2016   VLDL 38 09/15/2016   LDLCALC 82 09/15/2016     Current Medications: Current  Outpatient Prescriptions  Medication Sig Dispense Refill  . amLODipine (NORVASC) 5 MG tablet Take 5 mg by mouth daily.    Marland Kitchen aspirin EC 81 MG tablet Take 1 tablet by mouth daily.    Marland Kitchen Fexofenadine HCl (ALLEGRA PO) Take 180 mg by mouth daily.     . fluticasone (FLONASE) 50 MCG/ACT nasal spray Place 2 sprays into both nostrils daily.    . fluvoxaMINE (LUVOX) 100 MG tablet Take 2 tablets (200 mg total) by mouth at bedtime. 180 tablet 1  . hydrochlorothiazide (HYDRODIURIL) 12.5 MG tablet Take 12.5 mg by mouth daily.    . insulin aspart (NOVOLOG) 100 UNIT/ML injection Inject 1.15-1.25 Units into the skin See admin instructions. Via insulin pump-basal rate: 1.15-1.25 units/hr (every 8 carbs = 1 unit of insulin bolus) 0000-0800 = 1.15 units/hr and 0801/2359 = 1.25 units/hr    . levothyroxine (SYNTHROID, LEVOTHROID) 75 MCG tablet Take 75 mcg by mouth daily before breakfast.    . losartan (COZAAR) 100 MG tablet Take 1 tablet by mouth daily.    . pantoprazole (PROTONIX) 40 MG tablet Take 1 tablet by mouth 2 (two) times daily. Taking one tablet daily    . simvastatin (ZOCOR) 20 MG tablet Take 1 tablet by mouth every evening.     . ARIPiprazole (ABILIFY) 2 MG tablet Take 1 tablet (2 mg total) by mouth daily. 90 tablet 1  . traZODone (DESYREL) 100 MG tablet Take 1 tablet (100 mg total) by mouth at bedtime. 90 tablet 1   No current facility-administered medications for this visit.    Facility-Administered Medications Ordered in Other Visits  Medication Dose Route Frequency Provider Last Rate Last Dose  . acetaminophen (TYLENOL) tablet 650 mg  650 mg Oral Q6H PRN Pucilowska, Jolanta B, MD      . alum & mag hydroxide-simeth (MAALOX/MYLANTA) 200-200-20 MG/5ML suspension 30 mL  30 mL Oral Q4H PRN Pucilowska, Jolanta B, MD      . fluticasone (FLONASE) 50 MCG/ACT nasal spray 2 spray  2 spray Each Nare Daily Pucilowska, Jolanta B, MD      . loratadine (CLARITIN) tablet 10 mg  10 mg Oral Daily Pucilowska, Jolanta B,  MD      . magnesium hydroxide (MILK OF MAGNESIA) suspension 30 mL  30 mL Oral Daily PRN Pucilowska, Jolanta B, MD        Neurologic: Headache: Negative Seizure: Negative Paresthesias: Negative  Musculoskeletal: Strength & Muscle Tone: within normal limits Gait & Station: normal Patient leans: N/A  Psychiatric Specialty Exam: ROS  Blood pressure 130/76, pulse 85, height 6' (1.829 m), weight 255 lb 3.2 oz (115.8 kg).Body mass index is 34.61 kg/m.  General Appearance: Casual and Fairly Groomed  Eye Contact:  Good  Speech:  Garbled and with trach  Volume:  Normal  Mood:  Depressed and Dysphoric  Affect:  Appropriate and Congruent  Thought Process:  Goal Directed  Orientation:  Full (Time, Place, and Person)  Thought Content: Logical   Suicidal Thoughts:  No  Homicidal Thoughts:  No  Memory:  Immediate;   Good  Judgement:  Fair  Insight:  Fair  Psychomotor Activity:  Normal  Concentration:  Concentration: Good  Recall:  Good  Fund of Knowledge: Good  Language: Good  Akathisia:  Negative  Handed:  Right  AIMS (if indicated):  0  Assets:  Communication Skills Desire for Improvement Financial Resources/Insurance Housing Transportation  ADL's:  Intact  Cognition: WNL  Sleep:  5.5 hours     Treatment Plan Summary: [name] is a 56 year old male with a history of hypothyroidism, hypertension, dyslipidemia, type 1 diabetes. He presents today for psychiatric follow-up after an initial intake in June. He has 3 prior psychiatric hospitalization in 2 prior suicide attempts. He has gained about 13 pounds from Seroquel in the last month, and given his health issues, we have agreed to discontinue this and initiate Abilify as an augmenting agent for his depression. We have also agreed to start trazodone for his sleep, and also discussed CBT for insomnia techniques, and appropriate sleep hygiene. He continues to work in individual therapy with [facility] in this clinic, and has a  good support system at home. We will follow-up in 6-8 weeks or sooner if needed.  1. Moderate episode of recurrent major depressive disorder (HCC)    Discontinue Seroquel Continue Luvox, but increase to 100 mg twice daily Initiate Abilify 2 mg daily, okay to increase to 4 mg as tolerated in 1 week Initiate trazodone 100 mg nightly, okay to increase to 200 mg if he is tolerating and continues to have sleep difficulties Return to clinic in 6-8 weeks Continue to work with [facility] in individual therapy    Aundra Dubin, MD 11/30/2016, 4:29 PM

## 2016-12-04 ENCOUNTER — Ambulatory Visit (HOSPITAL_COMMUNITY): Payer: Self-pay | Admitting: Licensed Clinical Social Worker

## 2016-12-05 DIAGNOSIS — Z930 Tracheostomy status: Secondary | ICD-10-CM | POA: Diagnosis not present

## 2016-12-06 ENCOUNTER — Ambulatory Visit (HOSPITAL_COMMUNITY): Payer: Self-pay | Admitting: Licensed Clinical Social Worker

## 2016-12-06 ENCOUNTER — Ambulatory Visit (INDEPENDENT_AMBULATORY_CARE_PROVIDER_SITE_OTHER): Payer: PPO | Admitting: Licensed Clinical Social Worker

## 2016-12-06 ENCOUNTER — Encounter (HOSPITAL_COMMUNITY): Payer: Self-pay | Admitting: Licensed Clinical Social Worker

## 2016-12-06 DIAGNOSIS — F331 Major depressive disorder, recurrent, moderate: Secondary | ICD-10-CM | POA: Diagnosis not present

## 2016-12-06 NOTE — Progress Notes (Signed)
   THERAPIST PROGRESS NOTE  Session Time: 9-9:45am  Participation Level: Active  Behavioral Response: Casual and Well GroomedAlertEuthymic  Type of Therapy: Individual Therapy  Treatment Goals addressed: Diagnosis: MDD  Interventions: CBT, Strength-based and Supportive  Summary: [name] is a 56 y.o. male seeking help with his depression. He stated his symptoms are much improved and he has not experienced any depression in the past week. He states his sleep is improved w/ Trazadone and he is adjusting well to taking Abilify w/ the Luvox. Pt presented in some distress since he was having trouble with his voicebox, his breathing, and his speech. He stated he has been dealing with breathing difficulties outside his normal functioning and has an appointment scheduled in [place] to see his specialist later today. Pt was upbeat and positive during session. He shared more intimate details about his past failed relationsihps. Pt shared about his attraction to "pretty but damaged women" who often had drug problems. Pt and counselor discussed pt's plan to pursue a new intimate relationship.   Suicidal/Homicidal: Nowithout intent/plan  Therapist Response: Pt is responding positively to CBT based interventions as well as supportive, encouragement and genuine positive regard.   Plan: Return again in 1 weeks.  Diagnosis:    ICD-10-CM   1. Moderate episode of recurrent major depressive disorder [facility]) F33.1        [name] 12/06/2016

## 2016-12-11 ENCOUNTER — Ambulatory Visit (INDEPENDENT_AMBULATORY_CARE_PROVIDER_SITE_OTHER): Payer: PPO | Admitting: Licensed Clinical Social Worker

## 2016-12-11 DIAGNOSIS — F331 Major depressive disorder, recurrent, moderate: Secondary | ICD-10-CM

## 2016-12-12 ENCOUNTER — Ambulatory Visit (HOSPITAL_COMMUNITY): Payer: Self-pay | Admitting: Licensed Clinical Social Worker

## 2016-12-14 ENCOUNTER — Encounter (HOSPITAL_COMMUNITY): Payer: Self-pay | Admitting: Licensed Clinical Social Worker

## 2016-12-14 NOTE — Progress Notes (Signed)
  Weekly Group Progress Note  Program: OUTPATIENT SKILLS GROUP  Group Time: 5:30-6:30pm  Participation Level: Active  Behavioral Response: Appropriate and Sharing  Type of Therapy:  Individual Therapy  Skills discussed: Mindfulness, Grounding techniques for soothing   Summary of Progress: Pt reports good progress. He states he is working out more regularly and adjusting his work out goals to be more realistic which causes him less depression. Pt was supportive towards new group members. He was active and sharing. Pt was able to relate the grounding exercise to an practice he was already performing in which he goes walking around his yard barefoot in the rain as a heightened sensory experience.   Summary of Group: Counselor used processing techniques, open questions, and focusing to help pts share about their struggles w/ mixed anxiety, depression, and substance abuse. Two new pts were present and shared openly. Counselor led psychoeducation session using "Seeking Safety"'s grounding techniques for mental, soothing, and physical grounding when stressed or triggered. Pts responded positively and stated they intended to practice skill before next session.    Archie Balboa, LPCA, LCASA

## 2016-12-18 ENCOUNTER — Ambulatory Visit (HOSPITAL_COMMUNITY): Payer: Self-pay | Admitting: Licensed Clinical Social Worker

## 2016-12-25 ENCOUNTER — Ambulatory Visit (HOSPITAL_COMMUNITY): Payer: Self-pay | Admitting: Licensed Clinical Social Worker

## 2016-12-26 DIAGNOSIS — E1065 Type 1 diabetes mellitus with hyperglycemia: Secondary | ICD-10-CM | POA: Diagnosis not present

## 2016-12-26 DIAGNOSIS — E7800 Pure hypercholesterolemia, unspecified: Secondary | ICD-10-CM | POA: Diagnosis not present

## 2016-12-26 DIAGNOSIS — N183 Chronic kidney disease, stage 3 (moderate): Secondary | ICD-10-CM | POA: Diagnosis not present

## 2016-12-26 DIAGNOSIS — Z79899 Other long term (current) drug therapy: Secondary | ICD-10-CM | POA: Diagnosis not present

## 2016-12-26 DIAGNOSIS — Z9002 Acquired absence of larynx: Secondary | ICD-10-CM | POA: Diagnosis not present

## 2016-12-26 DIAGNOSIS — E1022 Type 1 diabetes mellitus with diabetic chronic kidney disease: Secondary | ICD-10-CM | POA: Diagnosis not present

## 2016-12-26 DIAGNOSIS — E039 Hypothyroidism, unspecified: Secondary | ICD-10-CM | POA: Diagnosis not present

## 2016-12-27 ENCOUNTER — Encounter (HOSPITAL_COMMUNITY): Payer: Self-pay | Admitting: Licensed Clinical Social Worker

## 2016-12-27 ENCOUNTER — Ambulatory Visit (INDEPENDENT_AMBULATORY_CARE_PROVIDER_SITE_OTHER): Payer: PPO | Admitting: Licensed Clinical Social Worker

## 2016-12-27 DIAGNOSIS — F331 Major depressive disorder, recurrent, moderate: Secondary | ICD-10-CM | POA: Diagnosis not present

## 2016-12-27 NOTE — Progress Notes (Signed)
  Weekly Group Progress Note  Program: OUTPATIENT SKILLS GROUP  Group Time: 5:30-6:30pm  Participation Level: Active  Behavioral Response: Appropriate and Sharing  Type of Therapy:  Psycho-education Group  Skills discussed: Anger Management   Summary of Progress: Pt reports significant gains in his ability to complete tasks, increase his positive self talk, and has re-entered into a relationship w/ his ex GF.   Summary of Group: Counselor led 3 min mindfulness breathing meditation, 45 min process session and 15 min anger management lesson. Pts were active and engaged.     Archie Balboa, LPCA, LCASA

## 2017-01-01 ENCOUNTER — Ambulatory Visit (HOSPITAL_COMMUNITY): Payer: Self-pay | Admitting: Licensed Clinical Social Worker

## 2017-01-02 DIAGNOSIS — Z0000 Encounter for general adult medical examination without abnormal findings: Secondary | ICD-10-CM | POA: Diagnosis not present

## 2017-01-05 DIAGNOSIS — R491 Aphonia: Secondary | ICD-10-CM | POA: Diagnosis not present

## 2017-01-05 DIAGNOSIS — Z930 Tracheostomy status: Secondary | ICD-10-CM | POA: Diagnosis not present

## 2017-01-05 DIAGNOSIS — Z9002 Acquired absence of larynx: Secondary | ICD-10-CM | POA: Diagnosis not present

## 2017-01-05 DIAGNOSIS — Z6833 Body mass index (BMI) 33.0-33.9, adult: Secondary | ICD-10-CM | POA: Diagnosis not present

## 2017-01-08 ENCOUNTER — Ambulatory Visit (HOSPITAL_COMMUNITY): Payer: Self-pay | Admitting: Licensed Clinical Social Worker

## 2017-01-10 ENCOUNTER — Ambulatory Visit (INDEPENDENT_AMBULATORY_CARE_PROVIDER_SITE_OTHER): Payer: PPO | Admitting: Licensed Clinical Social Worker

## 2017-01-10 DIAGNOSIS — F331 Major depressive disorder, recurrent, moderate: Secondary | ICD-10-CM

## 2017-01-12 ENCOUNTER — Encounter (HOSPITAL_COMMUNITY): Payer: Self-pay | Admitting: Licensed Clinical Social Worker

## 2017-01-12 NOTE — Progress Notes (Signed)
  Weekly Group Progress Note  Program: OUTPATIENT SKILLS GROUP  Group Time: 5:30-6:30pm  Participation Level: Active  Behavioral Response: Appropriate and Sharing  Type of Therapy:  Psycho-education Group  Skills discussed: Processing   Summary of Progress: Pt was active and engaged for group. He shared that he was feeling much better and has not struggled w/ depressive thoughts for a few weeks. He offered helpful and supportive feedback for another group member who was struggling w/ a relapse on alcohol and episode of depression. Pt told other pt to inquire about Naltrexone for alcohol cravings.  Summary of Group: Patients were active and engaged for 1hr group psychoeducation session. Pts were led in exploring their feelings and thoughts about the past week and strategies they had employed to combat anxiety, depression, and substance abuse. One pt was very tearful and emotionally activated during session describing her stress since returning to alcohol after over 90 days sober and the group supported her and offered feedback.    Archie Balboa, LPCA, LCASA

## 2017-01-15 ENCOUNTER — Ambulatory Visit (HOSPITAL_COMMUNITY): Payer: Self-pay | Admitting: Licensed Clinical Social Worker

## 2017-01-16 ENCOUNTER — Telehealth: Payer: Self-pay | Admitting: Pharmacist

## 2017-01-16 ENCOUNTER — Ambulatory Visit (INDEPENDENT_AMBULATORY_CARE_PROVIDER_SITE_OTHER): Payer: PPO | Admitting: Psychiatry

## 2017-01-16 DIAGNOSIS — E039 Hypothyroidism, unspecified: Secondary | ICD-10-CM | POA: Diagnosis not present

## 2017-01-16 DIAGNOSIS — F331 Major depressive disorder, recurrent, moderate: Secondary | ICD-10-CM

## 2017-01-16 DIAGNOSIS — I10 Essential (primary) hypertension: Secondary | ICD-10-CM | POA: Diagnosis not present

## 2017-01-16 DIAGNOSIS — E785 Hyperlipidemia, unspecified: Secondary | ICD-10-CM | POA: Diagnosis not present

## 2017-01-16 DIAGNOSIS — E108 Type 1 diabetes mellitus with unspecified complications: Secondary | ICD-10-CM | POA: Diagnosis not present

## 2017-01-16 DIAGNOSIS — Z87891 Personal history of nicotine dependence: Secondary | ICD-10-CM | POA: Diagnosis not present

## 2017-01-16 MED ORDER — ARIPIPRAZOLE 2 MG PO TABS: 2.0000 mg | ORAL_TABLET | Freq: Every day | ORAL | 1 refills | Status: DC

## 2017-01-16 MED ORDER — TRAZODONE HCL 100 MG PO TABS: 100.0000 mg | ORAL_TABLET | Freq: Every day | ORAL | 1 refills | Status: DC

## 2017-01-16 MED ORDER — FLUVOXAMINE MALEATE 100 MG PO TABS: 200.0000 mg | ORAL_TABLET | Freq: Every day | ORAL | 1 refills | Status: DC

## 2017-01-16 NOTE — Progress Notes (Signed)
[facility] MD/PA/NP OP Progress Note  01/16/2017 4:12 PM [name]  MRN:  [id number]  Chief Complaint: med check  HPI: [name] reports that the Abilify has done extremely well in terms of his mood symptoms, in conjunction with trazodone and Luvox. He continues to participate in individual therapy and develop his coping strategies. He denies any significant side effects. Reports that he has not had any suicidal thoughts. He wishes to leave the current medication regimen as is and follow-up in 3 months. Spent time processing some of his relief, and some of his feelings of validation, he expresses gratitude for care.  Visit Diagnosis:    ICD-10-CM   1. Moderate episode of recurrent major depressive disorder (HCC) F33.1 ARIPiprazole (ABILIFY) 2 MG tablet    traZODone (DESYREL) 100 MG tablet    fluvoxaMINE (LUVOX) 100 MG tablet   Past Psychiatric History: See intake H&P for full details. Reviewed, with no updates at this time.  Past Medical History:  Past Medical History:  Diagnosis Date  . Depression   . Diabetes (HCC)    Insulin Pump  . Diabetes mellitus type I ([place])   . Diabetes mellitus without complication ([place])   . GERD (gastroesophageal reflux disease)   . H/O laryngectomy   . Hyperlipidemia   . Hypertension   . Suicide attempt [facility]) 2014   damaged larynx - tracheostomy  . Thyroid disease     Past Surgical History:  Procedure Laterality Date  . LARYNGECTOMY    . TRACHEOSTOMY  2014   from [place] attempt    Family Psychiatric History: See intake H&P for full details. Reviewed, with no updates at this time.   Family History: No family history on file.  Social History:  Social History   Social History  . Marital status: Single    Spouse name: N/A  . Number of children: N/A  . Years of education: N/A   Social History Main Topics  . Smoking status: Former Smoker    Packs/day: 0.00    Types: Cigarettes    Quit date: 04/09/2013  . Smokeless tobacco: Never  Used  . Alcohol use No     Comment: Pt denied; BAC not available  . Drug use: No     Comment: Pt denied; UDS not available  . Sexual activity: No   Other Topics Concern  . Not on file   Social History Narrative  . No narrative on file    Allergies:  Allergies  Allergen Reactions  . Buspar [Buspirone]     Makes the patient "flip out"  . Depakote [Valproic Acid]     Causes excessive drowsiness  . Clopidogrel Rash  . Gabapentin Itching and Rash    Metabolic Disorder Labs: Lab Results  Component Value Date   HGBA1C 8.1 (H) 09/15/2016   MPG 186 09/15/2016   No results found for: PROLACTIN Lab Results  Component Value Date   CHOL 161 09/15/2016   TRIG 190 (H) 09/15/2016   HDL 41 09/15/2016   CHOLHDL 3.9 09/15/2016   VLDL 38 09/15/2016   LDLCALC 82 09/15/2016   Lab Results  Component Value Date   TSH 1.952 09/15/2016    Therapeutic Level Labs: No results found for: LITHIUM Lab Results  Component Value Date   VALPROATE 19 (L) 09/12/2016   No components found for:  CBMZ  Current Medications: Current Outpatient Prescriptions  Medication Sig Dispense Refill  . amLODipine (NORVASC) 10 MG tablet     . ARIPiprazole (ABILIFY) 2  MG tablet Take 1 tablet (2 mg total) by mouth daily. 90 tablet 1  . aspirin EC 81 MG tablet Take 1 tablet by mouth daily.    Marland Kitchen Fexofenadine HCl (ALLEGRA PO) Take 180 mg by mouth daily.     . fluticasone (FLONASE) 50 MCG/ACT nasal spray Place 2 sprays into both nostrils daily.    . fluvoxaMINE (LUVOX) 100 MG tablet Take 2 tablets (200 mg total) by mouth at bedtime. 180 tablet 1  . insulin aspart (NOVOLOG) 100 UNIT/ML injection Inject 1.15-1.25 Units into the skin See admin instructions. Via insulin pump-basal rate: 1.15-1.25 units/hr (every 8 carbs = 1 unit of insulin bolus) 0000-0800 = 1.15 units/hr and 0801/2359 = 1.25 units/hr    . levothyroxine (SYNTHROID, LEVOTHROID) 75 MCG tablet Take 75 mcg by mouth daily before breakfast.    .  losartan-hydrochlorothiazide (HYZAAR) 100-25 MG tablet     . pantoprazole (PROTONIX) 40 MG tablet Take 1 tablet by mouth 2 (two) times daily. Taking one tablet daily    . simvastatin (ZOCOR) 20 MG tablet Take 1 tablet by mouth every evening.     . traZODone (DESYREL) 100 MG tablet Take 1 tablet (100 mg total) by mouth at bedtime. 90 tablet 1   No current facility-administered medications for this visit.      Musculoskeletal: Strength & Muscle Tone: within normal limits Gait & Station: normal Patient leans: N/A  Psychiatric Specialty Exam: ROS  Blood pressure 130/76, pulse 85, height 6' (1.829 m), weight 252 lb 6.4 oz (114.5 kg).Body mass index is 34.23 kg/m.  General Appearance: Casual and Fairly Groomed  Eye Contact:  Good  Speech:  Garbled and trach  Volume:  Normal  Mood:  Euthymic  Affect:  Appropriate and Congruent  Thought Process:  Goal Directed  Orientation:  Full (Time, Place, and Person)  Thought Content: Logical   Suicidal Thoughts:  No  Homicidal Thoughts:  No  Memory:  Immediate;   Good  Judgement:  Good  Insight:  Fair  Psychomotor Activity:  Normal  Concentration:  Concentration: Fair  Recall:  Good  Fund of Knowledge: Good  Language: Good  Akathisia:  Negative  Handed:  Right  AIMS (if indicated): not done  Assets:  Communication Skills Desire for Improvement Financial Resources/Insurance Housing Intimacy Leisure Time [place] Talents/Skills Transportation  ADL's:  Intact  Cognition: WNL  Sleep:  Good   Screenings: AUDIT     Admission (Discharged) from 09/14/2016 in [facility]  Alcohol Use Disorder Identification Test Final Score (AUDIT)  1    PHQ2-9     Nutrition from 08/22/2016 in [facility]  PHQ-2 Total Score  2  PHQ-9 Total Score  4       Assessment and Plan: [name] is a 56 year old male with a long-standing history of major depressive disorder with  multiple prior psychiatric hospitalizations and multiple prior suicide attempts. 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 He has medical comorbidities of hypothyroidism, type 1 diabetes, hypertension, dyslipidemia. We transitioned from Seroquel to Abilify, for augmenting his antidepressant regimen, and he continues to participate in individual therapy. He appears to have had significant improvement of his depressive symptoms with the current regimen. Will proceed as below and follow up in 3 months.  1. Moderate episode of recurrent major depressive disorder (HCC)    Continue Abilify 2 mg, okay to increase to 4 mg if mood symptoms worsen Continue Luvox 100 mg  twice daily Trazodone 100-200 mg nightly Return to clinic in 3 months Continue in individual therapy   Aundra Dubin, MD 01/16/2017, 4:12 PM

## 2017-01-16 NOTE — Telephone Encounter (Signed)
9/50/93 Faxed application to [employer] for enrollment for [employer] Inject insulin via insulin pump per doctor's directions. Average daily total (basal) insulin is 29 units. Average total insulin is 65-90 units (basal plus bolus). Max daily dose 90 units, # 4 vials. Also sent 1040, copy of card and pharmacy printout.[name]

## 2017-01-17 ENCOUNTER — Ambulatory Visit (HOSPITAL_COMMUNITY): Payer: Self-pay | Admitting: Licensed Clinical Social Worker

## 2017-01-18 DIAGNOSIS — E1029 Type 1 diabetes mellitus with other diabetic kidney complication: Secondary | ICD-10-CM | POA: Diagnosis not present

## 2017-01-18 DIAGNOSIS — E785 Hyperlipidemia, unspecified: Secondary | ICD-10-CM | POA: Diagnosis not present

## 2017-01-18 DIAGNOSIS — E781 Pure hyperglyceridemia: Secondary | ICD-10-CM | POA: Diagnosis not present

## 2017-01-18 DIAGNOSIS — E039 Hypothyroidism, unspecified: Secondary | ICD-10-CM | POA: Diagnosis not present

## 2017-01-18 DIAGNOSIS — E1065 Type 1 diabetes mellitus with hyperglycemia: Secondary | ICD-10-CM | POA: Diagnosis not present

## 2017-01-18 DIAGNOSIS — E10649 Type 1 diabetes mellitus with hypoglycemia without coma: Secondary | ICD-10-CM | POA: Diagnosis not present

## 2017-01-18 DIAGNOSIS — Z4681 Encounter for fitting and adjustment of insulin pump: Secondary | ICD-10-CM | POA: Diagnosis not present

## 2017-01-18 DIAGNOSIS — R809 Proteinuria, unspecified: Secondary | ICD-10-CM | POA: Diagnosis not present

## 2017-01-22 ENCOUNTER — Ambulatory Visit (HOSPITAL_COMMUNITY): Payer: Self-pay | Admitting: Licensed Clinical Social Worker

## 2017-01-24 ENCOUNTER — Ambulatory Visit (HOSPITAL_COMMUNITY): Payer: Self-pay | Admitting: Licensed Clinical Social Worker

## 2017-01-29 ENCOUNTER — Ambulatory Visit (HOSPITAL_COMMUNITY): Payer: Self-pay | Admitting: Licensed Clinical Social Worker

## 2017-01-31 ENCOUNTER — Ambulatory Visit (HOSPITAL_COMMUNITY): Payer: Self-pay | Admitting: Licensed Clinical Social Worker

## 2017-02-05 ENCOUNTER — Ambulatory Visit (HOSPITAL_COMMUNITY): Payer: Self-pay | Admitting: Licensed Clinical Social Worker

## 2017-02-05 DIAGNOSIS — Z930 Tracheostomy status: Secondary | ICD-10-CM | POA: Diagnosis not present

## 2017-02-07 ENCOUNTER — Ambulatory Visit (HOSPITAL_COMMUNITY): Payer: Self-pay | Admitting: Licensed Clinical Social Worker

## 2017-02-12 ENCOUNTER — Ambulatory Visit (HOSPITAL_COMMUNITY): Payer: Self-pay | Admitting: Licensed Clinical Social Worker

## 2017-02-14 ENCOUNTER — Ambulatory Visit (HOSPITAL_COMMUNITY): Payer: Self-pay | Admitting: Licensed Clinical Social Worker

## 2017-02-19 ENCOUNTER — Ambulatory Visit (HOSPITAL_COMMUNITY): Payer: Self-pay | Admitting: Licensed Clinical Social Worker

## 2017-02-21 ENCOUNTER — Ambulatory Visit (HOSPITAL_COMMUNITY): Payer: Self-pay | Admitting: Licensed Clinical Social Worker

## 2017-02-26 ENCOUNTER — Ambulatory Visit (HOSPITAL_COMMUNITY): Payer: Self-pay | Admitting: Licensed Clinical Social Worker

## 2017-02-28 ENCOUNTER — Ambulatory Visit (HOSPITAL_COMMUNITY): Payer: Self-pay | Admitting: Licensed Clinical Social Worker

## 2017-03-05 ENCOUNTER — Ambulatory Visit (HOSPITAL_COMMUNITY): Payer: Self-pay | Admitting: Licensed Clinical Social Worker

## 2017-03-07 ENCOUNTER — Ambulatory Visit (HOSPITAL_COMMUNITY): Payer: Self-pay | Admitting: Licensed Clinical Social Worker

## 2017-03-09 ENCOUNTER — Other Ambulatory Visit (HOSPITAL_COMMUNITY): Payer: Self-pay

## 2017-03-09 DIAGNOSIS — F331 Major depressive disorder, recurrent, moderate: Secondary | ICD-10-CM

## 2017-03-09 DIAGNOSIS — Z930 Tracheostomy status: Secondary | ICD-10-CM | POA: Diagnosis not present

## 2017-03-09 MED ORDER — QUETIAPINE FUMARATE 200 MG PO TABS: 200.0000 mg | ORAL_TABLET | Freq: Every day | ORAL | 0 refills | Status: DC

## 2017-03-12 ENCOUNTER — Ambulatory Visit (HOSPITAL_COMMUNITY): Payer: Self-pay | Admitting: Licensed Clinical Social Worker

## 2017-03-12 ENCOUNTER — Telehealth (HOSPITAL_COMMUNITY): Payer: Self-pay

## 2017-03-12 DIAGNOSIS — F331 Major depressive disorder, recurrent, moderate: Secondary | ICD-10-CM

## 2017-03-12 NOTE — Telephone Encounter (Signed)
Yes that's fine 

## 2017-03-12 NOTE — Telephone Encounter (Signed)
Medication refill request - Fax from patient's [facility] stating patient is requesting a 90 day order of Seroquel. #45 ordered on 03/09/17 for one at bedtime.

## 2017-03-13 MED ORDER — QUETIAPINE FUMARATE 200 MG PO TABS: 200.0000 mg | ORAL_TABLET | Freq: Every day | ORAL | 0 refills | Status: DC

## 2017-03-13 NOTE — Telephone Encounter (Signed)
A new 90 day order of patient's prescribed Seroquel 200 mg, one at bedtime with no refills e-scribed to [facility]'s [facility] in [place] per authorization of new order by Dr. Daron Offer.

## 2017-03-14 ENCOUNTER — Ambulatory Visit (HOSPITAL_COMMUNITY): Payer: Self-pay | Admitting: Licensed Clinical Social Worker

## 2017-04-09 DIAGNOSIS — Z930 Tracheostomy status: Secondary | ICD-10-CM | POA: Diagnosis not present

## 2017-04-17 ENCOUNTER — Encounter (HOSPITAL_COMMUNITY): Payer: Self-pay | Admitting: Psychiatry

## 2017-04-17 ENCOUNTER — Ambulatory Visit (INDEPENDENT_AMBULATORY_CARE_PROVIDER_SITE_OTHER): Payer: PPO | Admitting: Psychiatry

## 2017-04-17 VITALS — BP 138/68 | HR 87 | Ht 73.0 in | Wt 264.0 lb

## 2017-04-17 DIAGNOSIS — F5104 Psychophysiologic insomnia: Secondary | ICD-10-CM

## 2017-04-17 DIAGNOSIS — Z87891 Personal history of nicotine dependence: Secondary | ICD-10-CM

## 2017-04-17 DIAGNOSIS — F331 Major depressive disorder, recurrent, moderate: Secondary | ICD-10-CM | POA: Diagnosis not present

## 2017-04-17 MED ORDER — QUETIAPINE FUMARATE 50 MG PO TABS: 200.0000 mg | ORAL_TABLET | Freq: Every day | ORAL | 1 refills | Status: DC

## 2017-04-17 NOTE — Progress Notes (Signed)
[place] MD/PA/NP OP Progress Note  04/17/2017 3:11 PM [name]  MRN:  [id number]  Chief Complaint: doing well, better HPI: [name] reports sleep is good with trazodon and 0-1/2 tablet of seroquel. Agrees to taper further to PRN only. Reports mood and anxiety are better controlled with luvox/abilify. Reports that he is functioning well day to day, working on home rebuild, doing yard-work, and going for walks with his dog. Denies any SI, HI.    Not currently in therapy, but plans to consider restarting the spring.  Agrees to follow-up in 10-12 weeks or call sooner if needed.  Visit Diagnosis:    ICD-10-CM   1. Psychophysiological insomnia F51.04   2. Moderate episode of recurrent major depressive disorder (HCC) F33.1 QUEtiapine (SEROQUEL) 50 MG tablet    Past Psychiatric History: See intake H&P for full details. Reviewed, with no updates at this time.   Past Medical History:  Past Medical History:  Diagnosis Date  . Depression   . Diabetes (HCC)    Insulin Pump  . Diabetes mellitus type I ([place])   . Diabetes mellitus without complication ([place])   . GERD (gastroesophageal reflux disease)   . H/O laryngectomy   . Hyperlipidemia   . Hypertension   . Suicide attempt [facility]) 2014   damaged larynx - tracheostomy  . Thyroid disease     Past Surgical History:  Procedure Laterality Date  . LARYNGECTOMY    . TRACHEOSTOMY  2014   from [place] attempt    Family Psychiatric History: See intake H&P for full details. Reviewed, with no updates at this time.   Family History: No family history on file.  Social History:  Social History   Socioeconomic History  . Marital status: Single    Spouse name: None  . Number of children: None  . Years of education: None  . Highest education level: None  Social Needs  . Financial resource strain: None  . Food insecurity - worry: None  . Food insecurity - inability: None  . Transportation needs - medical: None  . Transportation  needs - non-medical: None  Occupational History  . None  Tobacco Use  . Smoking status: Former Smoker    Packs/day: 0.00    Types: Cigarettes    Last attempt to quit: 04/09/2013    Years since quitting: 4.0  . Smokeless tobacco: Never Used  Substance and Sexual Activity  . Alcohol use: Yes    Alcohol/week: 1.2 oz    Types: 2 Shots of liquor per week    Comment: Pt denied; BAC not available  . Drug use: No    Comment: Pt denied; UDS not available  . Sexual activity: Yes    Partners: Female    Birth control/protection: Condom  Other Topics Concern  . None  Social History Narrative  . None    Allergies:  Allergies  Allergen Reactions  . Buspar [Buspirone]     Makes the patient "flip out"  . Depakote [Valproic Acid]     Causes excessive drowsiness  . Clopidogrel Rash  . Gabapentin Itching and Rash    Metabolic Disorder Labs: Lab Results  Component Value Date   HGBA1C 8.1 (H) 09/15/2016   MPG 186 09/15/2016   No results found for: PROLACTIN Lab Results  Component Value Date   CHOL 161 09/15/2016   TRIG 190 (H) 09/15/2016   HDL 41 09/15/2016   CHOLHDL 3.9 09/15/2016   VLDL 38 09/15/2016   LDLCALC 82 09/15/2016  Lab Results  Component Value Date   TSH 1.952 09/15/2016    Therapeutic Level Labs: No results found for: LITHIUM Lab Results  Component Value Date   VALPROATE 19 (L) 09/12/2016   No components found for:  CBMZ  Current Medications: Current Outpatient Medications  Medication Sig Dispense Refill  . amLODipine (NORVASC) 10 MG tablet     . ARIPiprazole (ABILIFY) 2 MG tablet Take 1 tablet (2 mg total) by mouth daily. 90 tablet 1  . aspirin EC 81 MG tablet Take 1 tablet by mouth daily.    Marland Kitchen Fexofenadine HCl (ALLEGRA PO) Take 180 mg by mouth daily.     . fluticasone (FLONASE) 50 MCG/ACT nasal spray Place 2 sprays into both nostrils daily.    . fluvoxaMINE (LUVOX) 100 MG tablet Take 2 tablets (200 mg total) by mouth at bedtime. 180 tablet 1  .  insulin aspart (NOVOLOG) 100 UNIT/ML injection Inject 1.15-1.25 Units into the skin See admin instructions. Via insulin pump-basal rate: 1.15-1.25 units/hr (every 8 carbs = 1 unit of insulin bolus) 0000-0800 = 1.15 units/hr and 0801/2359 = 1.25 units/hr    . levothyroxine (SYNTHROID, LEVOTHROID) 75 MCG tablet Take 75 mcg by mouth daily before breakfast.    . losartan-hydrochlorothiazide (HYZAAR) 100-25 MG tablet     . pantoprazole (PROTONIX) 40 MG tablet Take 1 tablet by mouth 2 (two) times daily. Taking one tablet daily    . QUEtiapine (SEROQUEL) 50 MG tablet Take 4 tablets (200 mg total) by mouth at bedtime. 30 tablet 1  . simvastatin (ZOCOR) 20 MG tablet Take 1 tablet by mouth every evening.     . traZODone (DESYREL) 100 MG tablet Take 1 tablet (100 mg total) by mouth at bedtime. 90 tablet 1   No current facility-administered medications for this visit.      Musculoskeletal: Strength & Muscle Tone: within normal limits Gait & Station: normal Patient leans: N/A  Psychiatric Specialty Exam: ROS  Blood pressure 138/68, pulse 87, height 6\' 1"  (1.854 m), weight 264 lb (119.7 kg), SpO2 95 %.Body mass index is 34.83 kg/m.  General Appearance: Casual and Fairly Groomed  Eye Contact:  Good  Speech:  Clear and Coherent  Volume:  Normal  Mood:  Euthymic  Affect:  Appropriate and Congruent  Thought Process:  Goal Directed and Descriptions of Associations: Intact  Orientation:  Full (Time, Place, and Person)  Thought Content: Logical   Suicidal Thoughts:  No  Homicidal Thoughts:  No  Memory:  Immediate;   Good  Judgement:  Fair  Insight:  Fair  Psychomotor Activity:  Normal  Concentration:  Concentration: Fair  Recall:  [place] of Knowledge: Good  Language: Good  Akathisia:  Negative  Handed:  Right  AIMS (if indicated): not done  Assets:  Communication Skills Desire for Improvement Financial Resources/Insurance Housing  ADL's:  Intact  Cognition: WNL  Sleep:  Good    Screenings: AUDIT     Admission (Discharged) from 09/14/2016 in [facility]  Alcohol Use Disorder Identification Test Final Score (AUDIT)  1    PHQ2-9     Nutrition from 08/22/2016 in [facility]  PHQ-2 Total Score  2  PHQ-9 Total Score  4       Assessment and Plan:  [name] presents with fair mood stability, reports that Abilify has been quite helpful.  I reiterated the plan to him that we are tapering Seroquel and going to discontinue which we had agreed on  2 visits ago.  I clarified that he should take 100-200 mg of trazodone, and may use 50 mg of Seroquel on an as-needed basis only if he has trouble with sleeping.  We agreed to continue Abilify and Luvox at their current doses which appear to be quite helpful for him.  No suicidality or substance abuse, and we will follow-up in 4 months.  1. Psychophysiological insomnia   2. Moderate episode of recurrent major depressive disorder (HCC)     Status of current problems: stable  Labs Ordered: No orders of the defined types were placed in this encounter.   Labs Reviewed: n/a  Collateral Obtained/Records Reviewed: n/a  Plan:  Seroquel tapered to 50 mg as needed only for sleep, instructions to eventually discontinue in the next 1-2 months Trazodone 100-200 mg nightly for sleep Luvox 200 mg daily Abilify 2 mg daily Return to clinic in 4 months  I spent 15 minutes with the patient in direct face-to-face clinical care.  Greater than 50% of this time was spent in counseling and coordination of care with the patient.    Aundra Dubin, MD 04/17/2017, 3:11 PM

## 2017-04-19 ENCOUNTER — Telehealth (HOSPITAL_COMMUNITY): Payer: Self-pay

## 2017-04-19 NOTE — Telephone Encounter (Signed)
Medication refill - Fax received from patient's [facility] in [place] requesting a 90 day order for prescribed Seroquel 50 mg, one at bedtime as needed.  Patient returns next 08/16/17 and was seen 04/17/18

## 2017-04-20 NOTE — Telephone Encounter (Signed)
I sent over 30 days and 1 refill because we are tapering to discontinue the medicine, but if insurance requires we can send over 90 days with instructions to use only PRN after 4-6 weeks

## 2017-04-23 NOTE — Telephone Encounter (Signed)
Medication management - Called [facility] in [place] to inform pharmacist Dr. Joycelyn Schmid recent order for Seroquel was for 30 days + 1 refill as he plans to taper patient off medication and pharmacist stated this was fine for coverage.

## 2017-05-14 ENCOUNTER — Other Ambulatory Visit: Payer: Self-pay | Admitting: Family Medicine

## 2017-05-14 ENCOUNTER — Ambulatory Visit
Admission: RE | Admit: 2017-05-14 | Discharge: 2017-05-14 | Disposition: A | Discharge status: Home / Self Care | Payer: PPO | Source: Ambulatory Visit | Attending: Family Medicine | Admitting: Family Medicine

## 2017-05-14 DIAGNOSIS — M79604 Pain in right leg: Secondary | ICD-10-CM | POA: Diagnosis not present

## 2017-05-14 DIAGNOSIS — M7121 Synovial cyst of popliteal space [Baker], right knee: Secondary | ICD-10-CM | POA: Insufficient documentation

## 2017-05-14 DIAGNOSIS — I8391 Asymptomatic varicose veins of right lower extremity: Secondary | ICD-10-CM | POA: Insufficient documentation

## 2017-05-14 DIAGNOSIS — M79661 Pain in right lower leg: Secondary | ICD-10-CM | POA: Diagnosis not present

## 2017-05-14 DIAGNOSIS — E103291 Type 1 diabetes mellitus with mild nonproliferative diabetic retinopathy without macular edema, right eye: Secondary | ICD-10-CM | POA: Diagnosis not present

## 2017-06-12 DIAGNOSIS — Z930 Tracheostomy status: Secondary | ICD-10-CM | POA: Diagnosis not present

## 2017-06-13 DIAGNOSIS — Z930 Tracheostomy status: Secondary | ICD-10-CM | POA: Diagnosis not present

## 2017-06-19 DIAGNOSIS — E103293 Type 1 diabetes mellitus with mild nonproliferative diabetic retinopathy without macular edema, bilateral: Secondary | ICD-10-CM | POA: Diagnosis not present

## 2017-06-19 DIAGNOSIS — E103291 Type 1 diabetes mellitus with mild nonproliferative diabetic retinopathy without macular edema, right eye: Secondary | ICD-10-CM | POA: Diagnosis not present

## 2017-06-28 DIAGNOSIS — E782 Mixed hyperlipidemia: Secondary | ICD-10-CM | POA: Diagnosis not present

## 2017-06-28 DIAGNOSIS — N183 Chronic kidney disease, stage 3 (moderate): Secondary | ICD-10-CM | POA: Diagnosis not present

## 2017-06-28 DIAGNOSIS — E1022 Type 1 diabetes mellitus with diabetic chronic kidney disease: Secondary | ICD-10-CM | POA: Diagnosis not present

## 2017-07-02 DIAGNOSIS — Z930 Tracheostomy status: Secondary | ICD-10-CM | POA: Diagnosis not present

## 2017-07-05 DIAGNOSIS — Z125 Encounter for screening for malignant neoplasm of prostate: Secondary | ICD-10-CM | POA: Diagnosis not present

## 2017-07-05 DIAGNOSIS — I10 Essential (primary) hypertension: Secondary | ICD-10-CM | POA: Diagnosis not present

## 2017-07-05 DIAGNOSIS — Z79899 Other long term (current) drug therapy: Secondary | ICD-10-CM | POA: Diagnosis not present

## 2017-07-05 DIAGNOSIS — E7800 Pure hypercholesterolemia, unspecified: Secondary | ICD-10-CM | POA: Diagnosis not present

## 2017-07-05 DIAGNOSIS — K219 Gastro-esophageal reflux disease without esophagitis: Secondary | ICD-10-CM | POA: Diagnosis not present

## 2017-07-05 DIAGNOSIS — E1022 Type 1 diabetes mellitus with diabetic chronic kidney disease: Secondary | ICD-10-CM | POA: Diagnosis not present

## 2017-07-05 DIAGNOSIS — E039 Hypothyroidism, unspecified: Secondary | ICD-10-CM | POA: Diagnosis not present

## 2017-07-05 DIAGNOSIS — N183 Chronic kidney disease, stage 3 (moderate): Secondary | ICD-10-CM | POA: Diagnosis not present

## 2017-07-12 DIAGNOSIS — E1065 Type 1 diabetes mellitus with hyperglycemia: Secondary | ICD-10-CM | POA: Diagnosis not present

## 2017-07-13 DIAGNOSIS — Z930 Tracheostomy status: Secondary | ICD-10-CM | POA: Diagnosis not present

## 2017-07-20 DIAGNOSIS — E785 Hyperlipidemia, unspecified: Secondary | ICD-10-CM | POA: Diagnosis not present

## 2017-07-20 DIAGNOSIS — Z4681 Encounter for fitting and adjustment of insulin pump: Secondary | ICD-10-CM | POA: Diagnosis not present

## 2017-07-20 DIAGNOSIS — R809 Proteinuria, unspecified: Secondary | ICD-10-CM | POA: Diagnosis not present

## 2017-07-20 DIAGNOSIS — E781 Pure hyperglyceridemia: Secondary | ICD-10-CM | POA: Diagnosis not present

## 2017-07-20 DIAGNOSIS — E039 Hypothyroidism, unspecified: Secondary | ICD-10-CM | POA: Diagnosis not present

## 2017-07-20 DIAGNOSIS — E1065 Type 1 diabetes mellitus with hyperglycemia: Secondary | ICD-10-CM | POA: Diagnosis not present

## 2017-07-20 DIAGNOSIS — E1029 Type 1 diabetes mellitus with other diabetic kidney complication: Secondary | ICD-10-CM | POA: Diagnosis not present

## 2017-07-20 DIAGNOSIS — E10649 Type 1 diabetes mellitus with hypoglycemia without coma: Secondary | ICD-10-CM | POA: Diagnosis not present

## 2017-08-01 ENCOUNTER — Telehealth (HOSPITAL_COMMUNITY): Payer: Self-pay

## 2017-08-01 NOTE — Telephone Encounter (Signed)
Received a refill request for Quetiapine 50mg .  Pt next appointment is 08-16-17 please advise.

## 2017-08-01 NOTE — Telephone Encounter (Signed)
Sure that is fine to send refill

## 2017-08-02 ENCOUNTER — Other Ambulatory Visit (HOSPITAL_COMMUNITY): Payer: Self-pay

## 2017-08-02 DIAGNOSIS — F331 Major depressive disorder, recurrent, moderate: Secondary | ICD-10-CM

## 2017-08-02 MED ORDER — QUETIAPINE FUMARATE 50 MG PO TABS: 200.0000 mg | ORAL_TABLET | Freq: Every day | ORAL | 0 refills | Status: DC

## 2017-08-02 NOTE — Telephone Encounter (Signed)
Sent in script to pharmacy

## 2017-08-02 NOTE — Progress Notes (Signed)
Sent in script to pharmacy per Dr. Daron Offer

## 2017-08-10 DIAGNOSIS — M1712 Unilateral primary osteoarthritis, left knee: Secondary | ICD-10-CM | POA: Diagnosis not present

## 2017-08-16 ENCOUNTER — Encounter (HOSPITAL_COMMUNITY): Payer: Self-pay | Admitting: Psychiatry

## 2017-08-16 ENCOUNTER — Ambulatory Visit (INDEPENDENT_AMBULATORY_CARE_PROVIDER_SITE_OTHER): Payer: PPO | Admitting: Psychiatry

## 2017-08-16 VITALS — BP 150/90 | HR 78 | Ht 73.0 in | Wt 263.0 lb

## 2017-08-16 DIAGNOSIS — Z930 Tracheostomy status: Secondary | ICD-10-CM | POA: Diagnosis not present

## 2017-08-16 DIAGNOSIS — Z87891 Personal history of nicotine dependence: Secondary | ICD-10-CM

## 2017-08-16 DIAGNOSIS — F902 Attention-deficit hyperactivity disorder, combined type: Secondary | ICD-10-CM | POA: Diagnosis not present

## 2017-08-16 DIAGNOSIS — F411 Generalized anxiety disorder: Secondary | ICD-10-CM

## 2017-08-16 DIAGNOSIS — F331 Major depressive disorder, recurrent, moderate: Secondary | ICD-10-CM | POA: Diagnosis not present

## 2017-08-16 MED ORDER — FLUVOXAMINE MALEATE 100 MG PO TABS: 200.0000 mg | ORAL_TABLET | Freq: Every day | ORAL | 1 refills | Status: DC

## 2017-08-16 MED ORDER — ARIPIPRAZOLE 2 MG PO TABS: 2.0000 mg | ORAL_TABLET | Freq: Every day | ORAL | 1 refills | Status: DC

## 2017-08-16 MED ORDER — ALPRAZOLAM 1 MG PO TABS: 1.0000 mg | ORAL_TABLET | Freq: Every evening | ORAL | 3 refills | Status: DC | PRN

## 2017-08-16 NOTE — Progress Notes (Signed)
[place] MD/PA/NP OP Progress Note  08/16/2017 2:47 PM [name]  MRN:  [id number]  Chief Complaint: med management HPI: [name] reports that his anxiety, mood, depression have been stable.  He reports that the dose of Luvox and Abilify in combination are very effective.  He reports that the trazodone has not been effective for his sleep and even taking 2 tablets just makes him very groggy but he does not sleep any better.  He has not been taking the Seroquel for the same reason.  He reports that his girlfriend gave him half a tablet of Xanax 2 mg, and he took that 1 or 2 nights, and he reports that he slept wonderfully, did not wake up groggy, and did not have any side effects from it.  He wonders if he can try taking Xanax 1 mg nightly for a few months to see if this is an effective medication for him in the long-term.  Educated him that he should be taking other people's medications, and he apologizes and understands that this is not safe to do.  I also educated him on the risk of benzodiazepines including increased risk of falls and dementia.  Also educated him that it is habit forming, and never to use more than prescribed.  We agreed to initiate 1 mg Xanax nightly and discontinue trazodone and Seroquel.  He has trazodone on hand if he needs to use it, but otherwise we may be able to minimize additional medications if Xanax monotherapy for sleep is effective for him.    No acute safety issues, he feels very hopeful about the future.  He reports that his relationship with his girlfriend has been very good and we will follow-up in 4 months or sooner if needed.  Visit Diagnosis:    ICD-10-CM   1. Attention deficit hyperactivity disorder (ADHD), combined type F90.2 ALPRAZolam (XANAX) 1 MG tablet  2. GAD (generalized anxiety disorder) F41.1 ALPRAZolam (XANAX) 1 MG tablet  3. Moderate episode of recurrent major depressive disorder (HCC) F33.1 ALPRAZolam (XANAX) 1 MG tablet   fluvoxaMINE (LUVOX) 100 MG tablet    ARIPiprazole (ABILIFY) 2 MG tablet    Past Psychiatric History: See intake H&P for full details. Reviewed, with no updates at this time.   Past Medical History:  Past Medical History:  Diagnosis Date  . Depression   . Diabetes (HCC)    Insulin Pump  . Diabetes mellitus type I ([place])   . Diabetes mellitus without complication ([place])   . GERD (gastroesophageal reflux disease)   . H/O laryngectomy   . Hyperlipidemia   . Hypertension   . Suicide attempt [facility]) 2014   damaged larynx - tracheostomy  . Thyroid disease     Past Surgical History:  Procedure Laterality Date  . LARYNGECTOMY    . TRACHEOSTOMY  2014   from [place] attempt    Family Psychiatric History: See intake H&P for full details. Reviewed, with no updates at this time.   Family History: History reviewed. No pertinent family history.  Social History:  Social History   Socioeconomic History  . Marital status: Single    Spouse name: Not on file  . Number of children: Not on file  . Years of education: Not on file  . Highest education level: Not on file  Occupational History  . Not on file  Social Needs  . Financial resource strain: Not on file  . Food insecurity:    Worry: Not on file  Inability: Not on file  . Transportation needs:    Medical: Not on file    Non-medical: Not on file  Tobacco Use  . Smoking status: Former Smoker    Packs/day: 0.00    Types: Cigarettes    Last attempt to quit: 04/09/2013    Years since quitting: 4.3  . Smokeless tobacco: Never Used  Substance and Sexual Activity  . Alcohol use: Yes    Alcohol/week: 1.2 oz    Types: 2 Shots of liquor per week    Comment: Pt denied; BAC not available  . Drug use: No    Comment: Pt denied; UDS not available  . Sexual activity: Yes    Partners: Female    Birth control/protection: Condom  Lifestyle  . Physical activity:    Days per week: Not on file    Minutes per session: Not on file  . Stress:  Not on file  Relationships  . Social connections:    Talks on phone: Not on file    Gets together: Not on file    Attends religious service: Not on file    Active member of club or organization: Not on file    Attends meetings of clubs or organizations: Not on file    Relationship status: Not on file  Other Topics Concern  . Not on file  Social History Narrative  . Not on file    Allergies:  Allergies  Allergen Reactions  . Buspar [Buspirone]     Makes the patient "flip out"  . Depakote [Valproic Acid]     Causes excessive drowsiness  . Clopidogrel Rash  . Gabapentin Itching and Rash    Metabolic Disorder Labs: Lab Results  Component Value Date   HGBA1C 8.1 (H) 09/15/2016   MPG 186 09/15/2016   No results found for: PROLACTIN Lab Results  Component Value Date   CHOL 161 09/15/2016   TRIG 190 (H) 09/15/2016   HDL 41 09/15/2016   CHOLHDL 3.9 09/15/2016   VLDL 38 09/15/2016   LDLCALC 82 09/15/2016   Lab Results  Component Value Date   TSH 1.952 09/15/2016    Therapeutic Level Labs: No results found for: LITHIUM Lab Results  Component Value Date   VALPROATE 19 (L) 09/12/2016   No components found for:  CBMZ  Current Medications: Current Outpatient Medications  Medication Sig Dispense Refill  . amLODipine (NORVASC) 10 MG tablet     . ARIPiprazole (ABILIFY) 2 MG tablet Take 1 tablet (2 mg total) by mouth daily. 90 tablet 1  . aspirin EC 81 MG tablet Take 1 tablet by mouth daily.    Marland Kitchen Fexofenadine HCl (ALLEGRA PO) Take 180 mg by mouth daily.     . fluticasone (FLONASE) 50 MCG/ACT nasal spray Place 2 sprays into both nostrils daily.    . fluvoxaMINE (LUVOX) 100 MG tablet Take 2 tablets (200 mg total) by mouth at bedtime. 180 tablet 1  . insulin aspart (NOVOLOG) 100 UNIT/ML injection Inject 1.15-1.25 Units into the skin See admin instructions. Via insulin pump-basal rate: 1.15-1.25 units/hr (every 8 carbs = 1 unit of insulin bolus) 0000-0800 = 1.15 units/hr and  0801/2359 = 1.25 units/hr    . levothyroxine (SYNTHROID, LEVOTHROID) 75 MCG tablet Take 75 mcg by mouth daily before breakfast.    . losartan-hydrochlorothiazide (HYZAAR) 100-25 MG tablet     . simvastatin (ZOCOR) 20 MG tablet Take 1 tablet by mouth every evening.     Marland Kitchen ALPRAZolam (XANAX) 1 MG tablet Take 1  tablet (1 mg total) by mouth at bedtime as needed for sleep (for sleep). 30 tablet 3  . pantoprazole (PROTONIX) 40 MG tablet Take 1 tablet by mouth 2 (two) times daily. Taking one tablet daily     No current facility-administered medications for this visit.      Musculoskeletal: Strength & Muscle Tone: within normal limits Gait & Station: normal Patient leans: N/A  Psychiatric Specialty Exam: ROS  Blood pressure (!) 150/90, pulse 78, height 6\' 1"  (1.854 m), weight 263 lb (119.3 kg).Body mass index is 34.7 kg/m.  General Appearance: Casual and Fairly Groomed  Eye Contact:  Good  Speech:  trach  Volume:  Normal  Mood:  Euthymic  Affect:  Appropriate and Congruent  Thought Process:  Goal Directed and Descriptions of Associations: Intact  Orientation:  Full (Time, Place, and Person)  Thought Content: Logical   Suicidal Thoughts:  No  Homicidal Thoughts:  No  Memory:  Immediate;   Good  Judgement:  Good  Insight:  Good  Psychomotor Activity:  Normal  Concentration:  Attention Span: Good  Recall:  Good  Fund of Knowledge: Good  Language: Good  Akathisia:  Negative  Handed:  Right  AIMS (if indicated): not done  Assets:  Communication Skills Desire for Improvement Financial Resources/Insurance Housing Intimacy Leisure Time [place] Talents/Skills Transportation Vocational/Educational  ADL's:  Intact  Cognition: WNL  Sleep:  Poor   Screenings: AUDIT     Admission (Discharged) from 09/14/2016 in [place]  Alcohol Use Disorder Identification Test Final Score (AUDIT)  1    PHQ2-9     Nutrition from  08/22/2016 in [place]  PHQ-2 Total Score  2  PHQ-9 Total Score  4       Assessment and Plan: [name] reports that his overall mood and anxiety symptoms have been in remission, and the Abilify and Luvox tend to be quite effective for him, he takes them every day as prescribed.  His only issue continues to be related to sleep onset insomnia.  We have tried multiple sleep aids to no avail.  He tried Xanax 1 mg and reports that this was tolerated well and provided him with benefit.  I educated him on the risks and benefits of benzodiazepines and we agreed to a trial of Xanax nightly for insomnia purposes only.  We will proceed as below and follow-up in 3-4 months.  1. Attention deficit hyperactivity disorder (ADHD), combined type   2. GAD (generalized anxiety disorder)   3. Moderate episode of recurrent major depressive disorder (HCC)     Status of current problems: stable  Labs Ordered: No orders of the defined types were placed in this encounter.   Labs Reviewed: n/a  Collateral Obtained/Records Reviewed: n/a  Plan:  Continue Abilify 2 mg daily Continue Luvox 200 mg nightly Seroquel and trazodone discontinued; okay for patient to use trazodone if needed in conjunction with Xanax Xanax 1 mg nightly for sleep Return to clinic in 3-4 months  I spent 15 minutes with the patient in direct face-to-face clinical care.  Greater than 50% of this time was spent in counseling and coordination of care with the patient.    Aundra Dubin, MD 08/16/2017, 2:47 PM

## 2017-09-05 DIAGNOSIS — R69 Illness, unspecified: Secondary | ICD-10-CM | POA: Diagnosis not present

## 2017-09-05 DIAGNOSIS — Z930 Tracheostomy status: Secondary | ICD-10-CM | POA: Diagnosis not present

## 2017-09-07 DIAGNOSIS — E1065 Type 1 diabetes mellitus with hyperglycemia: Secondary | ICD-10-CM | POA: Diagnosis not present

## 2017-09-14 IMAGING — CR DG CHEST 2V
2 series · 2 of 2 positions shown · non-contrast
Comparison: None.

CLINICAL DATA: Increased shortness of breath. Chest pressure.
Increased anxiety.

EXAM:
CHEST  2 VIEW

[chest pa]
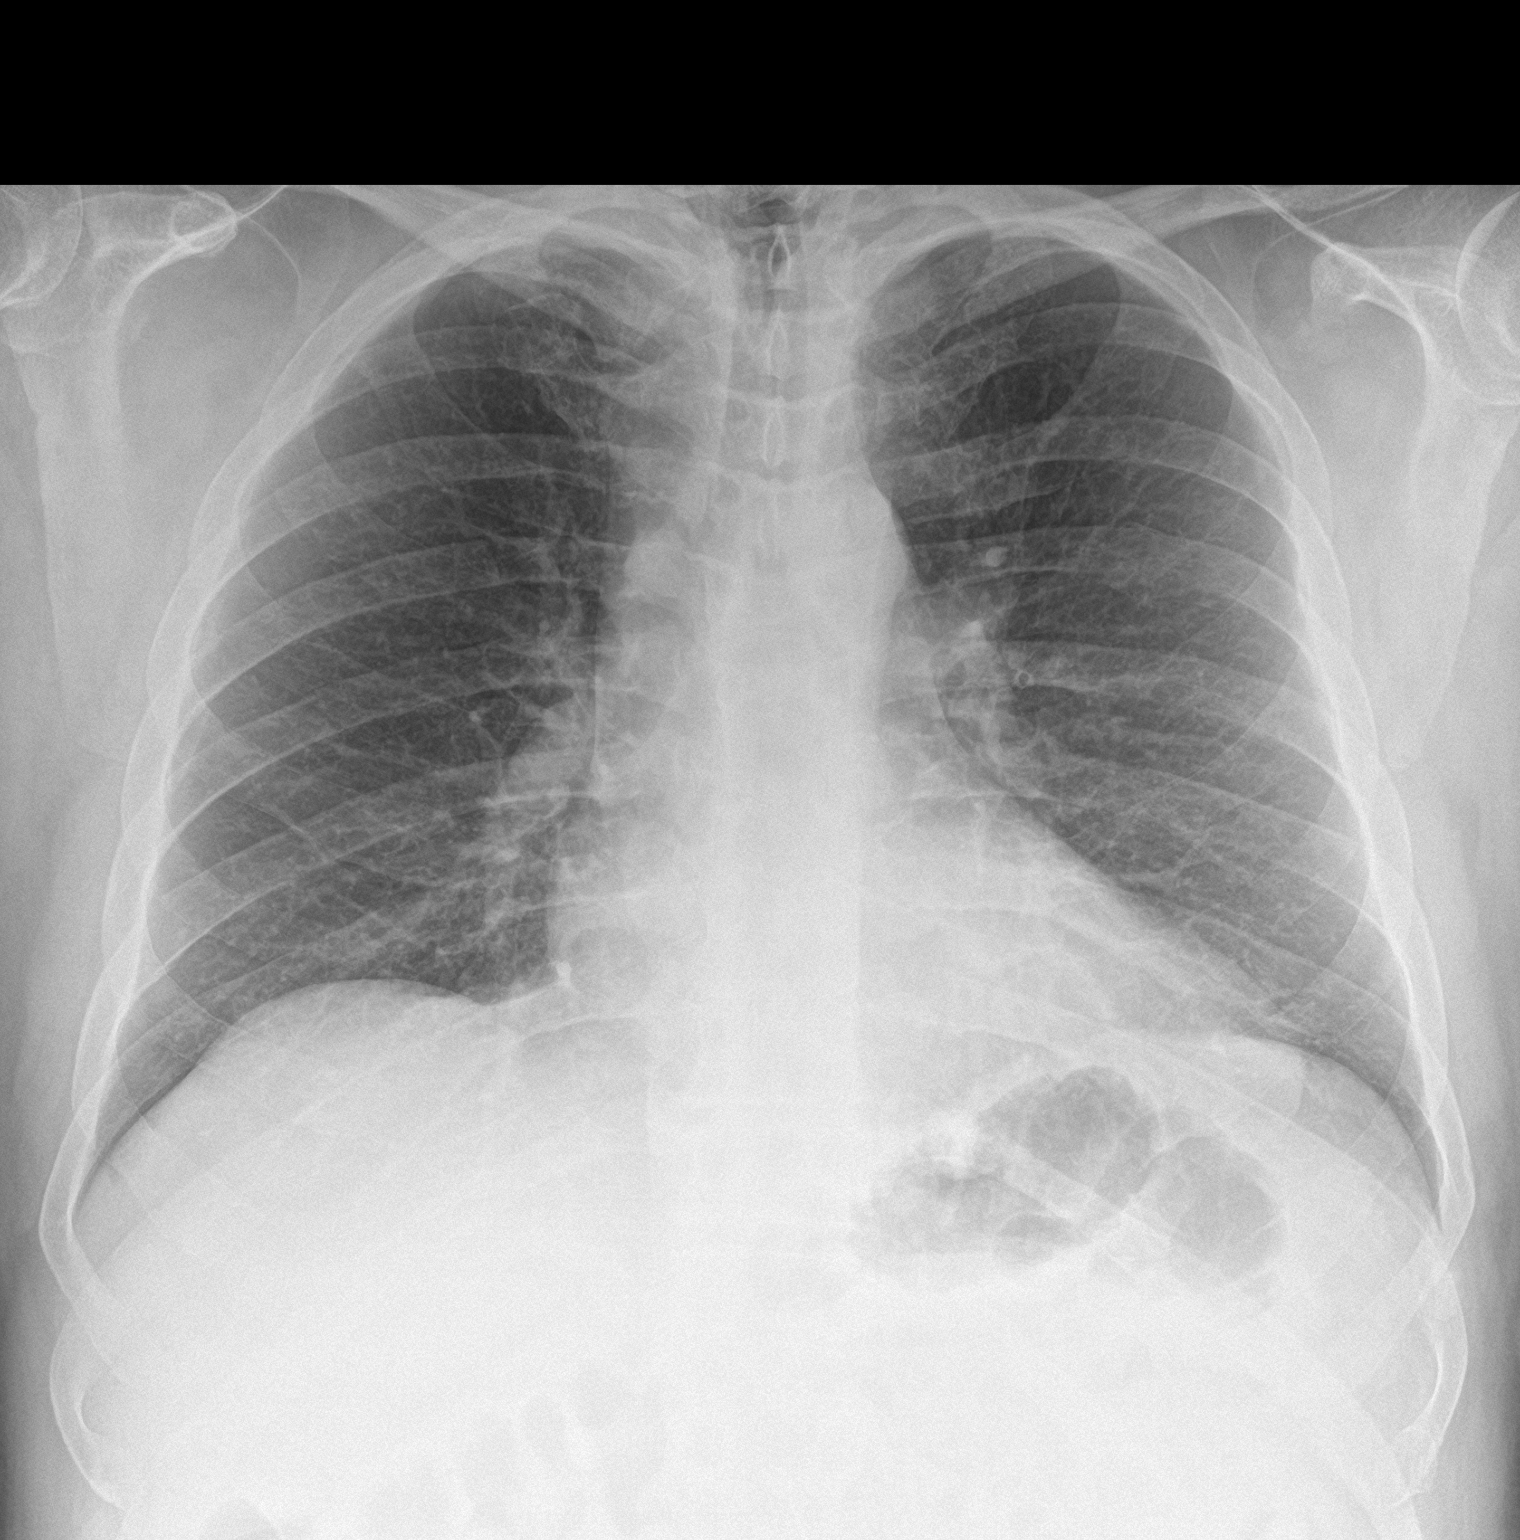

[chest lat]
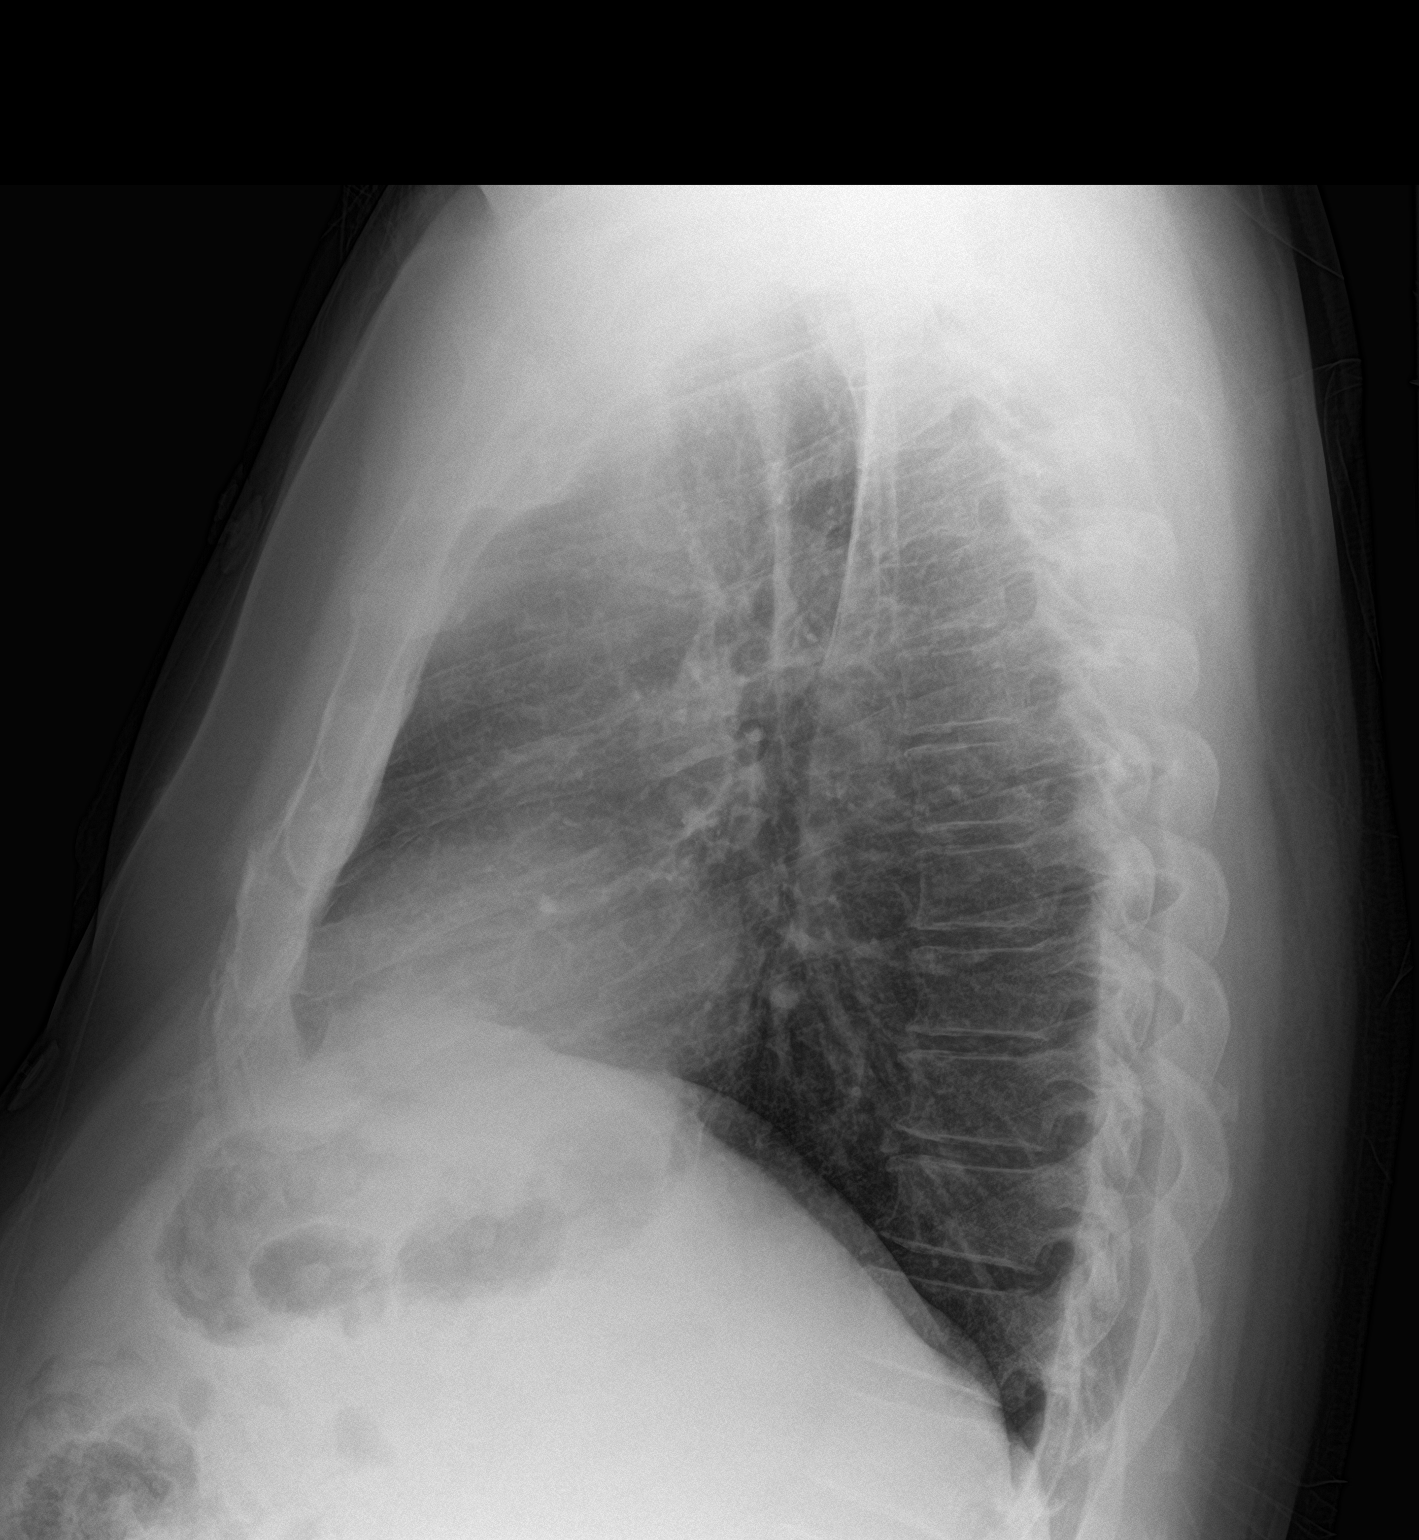

[2 of 2 positions shown; findings below may reference images not displayed]

FINDINGS: Normal sized heart. Clear lungs. Diffuse peribronchial thickening.
Mild thoracic spine degenerative changes.
IMPRESSION: Mild to moderate bronchitic changes.

## 2017-09-19 DIAGNOSIS — R220 Localized swelling, mass and lump, head: Secondary | ICD-10-CM | POA: Diagnosis not present

## 2017-09-19 DIAGNOSIS — I639 Cerebral infarction, unspecified: Secondary | ICD-10-CM | POA: Diagnosis not present

## 2017-09-19 DIAGNOSIS — Z930 Tracheostomy status: Secondary | ICD-10-CM | POA: Diagnosis not present

## 2017-09-19 DIAGNOSIS — J9620 Acute and chronic respiratory failure, unspecified whether with hypoxia or hypercapnia: Secondary | ICD-10-CM | POA: Diagnosis not present

## 2017-09-19 DIAGNOSIS — E1065 Type 1 diabetes mellitus with hyperglycemia: Secondary | ICD-10-CM | POA: Diagnosis not present

## 2017-09-21 DIAGNOSIS — I872 Venous insufficiency (chronic) (peripheral): Secondary | ICD-10-CM | POA: Diagnosis not present

## 2017-09-21 DIAGNOSIS — R600 Localized edema: Secondary | ICD-10-CM | POA: Diagnosis not present

## 2017-09-24 DIAGNOSIS — Z930 Tracheostomy status: Secondary | ICD-10-CM | POA: Diagnosis not present

## 2017-10-11 DIAGNOSIS — E1065 Type 1 diabetes mellitus with hyperglycemia: Secondary | ICD-10-CM | POA: Diagnosis not present

## 2017-10-12 DIAGNOSIS — J029 Acute pharyngitis, unspecified: Secondary | ICD-10-CM | POA: Diagnosis not present

## 2017-10-12 DIAGNOSIS — Z9002 Acquired absence of larynx: Secondary | ICD-10-CM | POA: Diagnosis not present

## 2017-10-15 DIAGNOSIS — Z930 Tracheostomy status: Secondary | ICD-10-CM | POA: Diagnosis not present

## 2017-10-20 DIAGNOSIS — I639 Cerebral infarction, unspecified: Secondary | ICD-10-CM | POA: Diagnosis not present

## 2017-10-20 DIAGNOSIS — R220 Localized swelling, mass and lump, head: Secondary | ICD-10-CM | POA: Diagnosis not present

## 2017-10-20 DIAGNOSIS — E1065 Type 1 diabetes mellitus with hyperglycemia: Secondary | ICD-10-CM | POA: Diagnosis not present

## 2017-10-20 DIAGNOSIS — J9620 Acute and chronic respiratory failure, unspecified whether with hypoxia or hypercapnia: Secondary | ICD-10-CM | POA: Diagnosis not present

## 2017-10-20 DIAGNOSIS — Z930 Tracheostomy status: Secondary | ICD-10-CM | POA: Diagnosis not present

## 2017-10-22 DIAGNOSIS — E103291 Type 1 diabetes mellitus with mild nonproliferative diabetic retinopathy without macular edema, right eye: Secondary | ICD-10-CM | POA: Diagnosis not present

## 2017-10-22 DIAGNOSIS — E103212 Type 1 diabetes mellitus with mild nonproliferative diabetic retinopathy with macular edema, left eye: Secondary | ICD-10-CM | POA: Diagnosis not present

## 2017-10-30 ENCOUNTER — Telehealth: Payer: Self-pay | Admitting: Pharmacy Technician

## 2017-10-30 DIAGNOSIS — Z930 Tracheostomy status: Secondary | ICD-10-CM | POA: Diagnosis not present

## 2017-10-30 DIAGNOSIS — R69 Illness, unspecified: Secondary | ICD-10-CM | POA: Diagnosis not present

## 2017-10-30 NOTE — Telephone Encounter (Signed)
Patient is in Medicare Part D coverage gap.  [facility] will be providing medication assistance for medications available through PAP and [employer].  Medication assistance will be provided through 05/07/2018.  Patient will be expected to use Medicare Part D plan beginning 05/08/2018.  Patient acknowledged that he understood.  [place] [facility] [facility]

## 2017-11-01 ENCOUNTER — Telehealth: Payer: Self-pay | Admitting: Pharmacy Technician

## 2017-11-01 NOTE — Telephone Encounter (Signed)
Patient indicated that he is seeing Dr. Darrick Meigs Endocrinology.  However, Dr. Eddie Dibbles is no longer at [facility].  Attempted to contact patient inquiring about the name of the physician that will be prescribing these medications for patient.  Unable to speak with patient.  Left message.   Unable to complete PAP applications for Novolog and Synthroid until provider is determined.  [facility]

## 2017-11-19 DIAGNOSIS — R220 Localized swelling, mass and lump, head: Secondary | ICD-10-CM | POA: Diagnosis not present

## 2017-11-19 DIAGNOSIS — J9620 Acute and chronic respiratory failure, unspecified whether with hypoxia or hypercapnia: Secondary | ICD-10-CM | POA: Diagnosis not present

## 2017-11-19 DIAGNOSIS — T1490XA Injury, unspecified, initial encounter: Secondary | ICD-10-CM | POA: Diagnosis not present

## 2017-11-19 DIAGNOSIS — I639 Cerebral infarction, unspecified: Secondary | ICD-10-CM | POA: Diagnosis not present

## 2017-11-19 DIAGNOSIS — Z930 Tracheostomy status: Secondary | ICD-10-CM | POA: Diagnosis not present

## 2017-11-19 DIAGNOSIS — E1065 Type 1 diabetes mellitus with hyperglycemia: Secondary | ICD-10-CM | POA: Diagnosis not present

## 2017-12-05 ENCOUNTER — Telehealth: Payer: Self-pay | Admitting: Pharmacy Technician

## 2017-12-05 NOTE — Telephone Encounter (Signed)
Patient called indicating that he is not in the Medicare Part D coverage gap. Stated that his insulin is being filed with his Part B.  Explained to patient that [facility] is unable to provide medication assistance unless he is in the coverage gap. Patient acknowledged that he understood.  [place] [facility]

## 2017-12-12 ENCOUNTER — Other Ambulatory Visit: Payer: Self-pay

## 2017-12-12 DIAGNOSIS — I10 Essential (primary) hypertension: Secondary | ICD-10-CM | POA: Diagnosis not present

## 2017-12-12 DIAGNOSIS — E039 Hypothyroidism, unspecified: Secondary | ICD-10-CM | POA: Diagnosis not present

## 2017-12-12 DIAGNOSIS — F419 Anxiety disorder, unspecified: Secondary | ICD-10-CM | POA: Diagnosis not present

## 2017-12-12 DIAGNOSIS — E109 Type 1 diabetes mellitus without complications: Secondary | ICD-10-CM | POA: Diagnosis not present

## 2017-12-12 DIAGNOSIS — Z01818 Encounter for other preprocedural examination: Secondary | ICD-10-CM | POA: Diagnosis not present

## 2017-12-12 DIAGNOSIS — F329 Major depressive disorder, single episode, unspecified: Secondary | ICD-10-CM | POA: Diagnosis not present

## 2017-12-12 DIAGNOSIS — K219 Gastro-esophageal reflux disease without esophagitis: Secondary | ICD-10-CM | POA: Diagnosis not present

## 2017-12-12 DIAGNOSIS — Z6835 Body mass index (BMI) 35.0-35.9, adult: Secondary | ICD-10-CM | POA: Diagnosis not present

## 2017-12-12 NOTE — Patient Outreach (Addendum)
[facility]) Care Management  12/12/2017  [name] June 23, 1960 [id number]  TELEPHONE SCREENING Referral date: 12/07/17 Referral source: HTA referral Referral reason: medication assistance Insurance: health [employer]  Telephone call to patient regarding HTA referral. HI[PAA verified with patient.  Explained reason for call. Patient states he needs assistance with his insulin.  States he has an insulin pump.  Patient states he recently bought 4 bottles of novolog insulin which cost him $230.  Patient states he has applied for assistance but was told his insulin was coming out of his part B instead of part D of his insurance.  Patient states he would have to meet the requirement of part D or "get in the donut hole" before he would qualify. Patient would like to speak further with the pharmacist about this.   Patient states he is getting ready to leave on a cruise and request pharmacist follow up with him after 12/24/17.  RNCM discussed  [facility] care management program.  Patient denies need for [place].  Patient states he just needs follow up with a pharmacist for medication assistance.   PLAN: RNCM will refer patient to [facility] pharmacist.   Quinn Plowman RN,BSN,CCM [facility] Telephonic  [phone]

## 2017-12-13 DIAGNOSIS — Z9641 Presence of insulin pump (external) (internal): Secondary | ICD-10-CM | POA: Diagnosis not present

## 2017-12-13 DIAGNOSIS — E785 Hyperlipidemia, unspecified: Secondary | ICD-10-CM | POA: Diagnosis not present

## 2017-12-13 DIAGNOSIS — E039 Hypothyroidism, unspecified: Secondary | ICD-10-CM | POA: Diagnosis not present

## 2017-12-13 DIAGNOSIS — J9503 Malfunction of tracheostomy stoma: Secondary | ICD-10-CM | POA: Diagnosis not present

## 2017-12-13 DIAGNOSIS — J398 Other specified diseases of upper respiratory tract: Secondary | ICD-10-CM | POA: Diagnosis not present

## 2017-12-13 DIAGNOSIS — Z87891 Personal history of nicotine dependence: Secondary | ICD-10-CM | POA: Diagnosis not present

## 2017-12-13 DIAGNOSIS — Z86718 Personal history of other venous thrombosis and embolism: Secondary | ICD-10-CM | POA: Diagnosis not present

## 2017-12-13 DIAGNOSIS — Z981 Arthrodesis status: Secondary | ICD-10-CM | POA: Diagnosis not present

## 2017-12-13 DIAGNOSIS — I129 Hypertensive chronic kidney disease with stage 1 through stage 4 chronic kidney disease, or unspecified chronic kidney disease: Secondary | ICD-10-CM | POA: Diagnosis not present

## 2017-12-13 DIAGNOSIS — I69351 Hemiplegia and hemiparesis following cerebral infarction affecting right dominant side: Secondary | ICD-10-CM | POA: Diagnosis not present

## 2017-12-13 DIAGNOSIS — E7800 Pure hypercholesterolemia, unspecified: Secondary | ICD-10-CM | POA: Diagnosis not present

## 2017-12-13 DIAGNOSIS — K219 Gastro-esophageal reflux disease without esophagitis: Secondary | ICD-10-CM | POA: Diagnosis not present

## 2017-12-13 DIAGNOSIS — E1022 Type 1 diabetes mellitus with diabetic chronic kidney disease: Secondary | ICD-10-CM | POA: Diagnosis not present

## 2017-12-13 DIAGNOSIS — N189 Chronic kidney disease, unspecified: Secondary | ICD-10-CM | POA: Diagnosis not present

## 2017-12-13 DIAGNOSIS — F419 Anxiety disorder, unspecified: Secondary | ICD-10-CM | POA: Diagnosis not present

## 2017-12-13 DIAGNOSIS — Z9002 Acquired absence of larynx: Secondary | ICD-10-CM | POA: Diagnosis not present

## 2017-12-17 DIAGNOSIS — Z430 Encounter for attention to tracheostomy: Secondary | ICD-10-CM | POA: Diagnosis not present

## 2017-12-20 ENCOUNTER — Ambulatory Visit (HOSPITAL_COMMUNITY): Payer: Self-pay | Admitting: Psychiatry

## 2017-12-20 DIAGNOSIS — R220 Localized swelling, mass and lump, head: Secondary | ICD-10-CM | POA: Diagnosis not present

## 2017-12-20 DIAGNOSIS — E1065 Type 1 diabetes mellitus with hyperglycemia: Secondary | ICD-10-CM | POA: Diagnosis not present

## 2017-12-20 DIAGNOSIS — Z930 Tracheostomy status: Secondary | ICD-10-CM | POA: Diagnosis not present

## 2017-12-20 DIAGNOSIS — I639 Cerebral infarction, unspecified: Secondary | ICD-10-CM | POA: Diagnosis not present

## 2017-12-20 DIAGNOSIS — J9620 Acute and chronic respiratory failure, unspecified whether with hypoxia or hypercapnia: Secondary | ICD-10-CM | POA: Diagnosis not present

## 2017-12-24 ENCOUNTER — Other Ambulatory Visit: Payer: Self-pay | Admitting: Pharmacist

## 2017-12-24 NOTE — Patient Outreach (Signed)
[facility]) Care Management  12/24/2017  Johnmichael Melhorn Feb 28, 1961 [id number]  57 year old male referred to [place] for medication assistance.  Patient previously referred in 2018 as well for medication assistance for insulin.  Per notes, patient applied for Novolog PAP through [facility].   Unsuccessful call to patient and patient's mother today.  I left HIPAA compliant voicemails on home and mobile phone numbers requesting a return call.   Plan: I will mail patient an outreach letter describing [facility] services and will follow-up in 3-4 business days.   Ralene Bathe, PharmD, [place] [phone]

## 2017-12-25 ENCOUNTER — Ambulatory Visit: Payer: Self-pay | Admitting: Pharmacist

## 2017-12-25 DIAGNOSIS — Z9002 Acquired absence of larynx: Secondary | ICD-10-CM | POA: Diagnosis not present

## 2017-12-25 DIAGNOSIS — Z963 Presence of artificial larynx: Secondary | ICD-10-CM | POA: Diagnosis not present

## 2017-12-25 DIAGNOSIS — J398 Other specified diseases of upper respiratory tract: Secondary | ICD-10-CM | POA: Diagnosis not present

## 2017-12-25 DIAGNOSIS — T85638A Leakage of other specified internal prosthetic devices, implants and grafts, initial encounter: Secondary | ICD-10-CM | POA: Diagnosis not present

## 2017-12-26 ENCOUNTER — Ambulatory Visit (HOSPITAL_COMMUNITY): Payer: PPO | Admitting: Psychiatry

## 2017-12-26 ENCOUNTER — Telehealth: Payer: Self-pay | Admitting: Pharmacist

## 2017-12-26 ENCOUNTER — Encounter (HOSPITAL_COMMUNITY): Payer: Self-pay | Admitting: Psychiatry

## 2017-12-26 DIAGNOSIS — F331 Major depressive disorder, recurrent, moderate: Secondary | ICD-10-CM

## 2017-12-26 DIAGNOSIS — F902 Attention-deficit hyperactivity disorder, combined type: Secondary | ICD-10-CM | POA: Diagnosis not present

## 2017-12-26 DIAGNOSIS — F411 Generalized anxiety disorder: Secondary | ICD-10-CM

## 2017-12-26 MED ORDER — ALPRAZOLAM 1 MG PO TABS: 1.0000 mg | ORAL_TABLET | Freq: Every evening | ORAL | 4 refills | Status: DC | PRN

## 2017-12-26 MED ORDER — FLUVOXAMINE MALEATE 100 MG PO TABS: 200.0000 mg | ORAL_TABLET | Freq: Every day | ORAL | 1 refills | Status: DC

## 2017-12-26 MED ORDER — ARIPIPRAZOLE 2 MG PO TABS: 2.0000 mg | ORAL_TABLET | Freq: Every day | ORAL | 1 refills | Status: DC

## 2017-12-26 NOTE — Patient Outreach (Signed)
[facility]) Care Management  12/26/2017  [name] 14-Sep-1960 [id number]  Incoming call received from [name] and from his mother.  HIPAA identifiers verified. Patient reports he no longer needs medication assistance as he was approved through patient assistance company through the end of 2019.  [facility] has assisted patient with application.  Patient reports he picked up 11 vials of insulin from clinic yesterday.  He denies needing any further medication assistance at this time.   Plan: I will close [facility] pharmacy case.   Ralene Bathe, PharmD, [place] [phone]    s

## 2017-12-26 NOTE — Progress Notes (Signed)
[facility] MD/PA/NP OP Progress Note  12/26/2017 2:38 PM [name]  MRN:  [id number]  Chief Complaint: med check  HPI: [name] reports that things are very stable with his mood, anxiety, sleep.  He reports that the Xanax works perfectly for him at night and he sleeps through the night sometimes wakes up once to go to the restroom, but is able to feel refreshed and rested in the morning.  He gets 8-9 hours of sleep consistently.  He recently got married and shares that the marriage has been good and things are going well in the relationship.  He reports that overall he is in a very positive state and this medication combination seems to agree with him.  He denies any acute concerns about his medications and denies any safety issues.Marland Kitchen  He is looking for a job and reports that he feels better than he has felt in many years, and his mood has been fairly consistent for the past 3-4 months.  Visit Diagnosis:    ICD-10-CM   1. Moderate episode of recurrent major depressive disorder (HCC) F33.1 fluvoxaMINE (LUVOX) 100 MG tablet    ALPRAZolam (XANAX) 1 MG tablet    ARIPiprazole (ABILIFY) 2 MG tablet  2. Attention deficit hyperactivity disorder (ADHD), combined type F90.2 ALPRAZolam (XANAX) 1 MG tablet  3. GAD (generalized anxiety disorder) F41.1 ALPRAZolam (XANAX) 1 MG tablet    Past Psychiatric History: See intake H&P for full details. Reviewed, with no updates at this time.   Past Medical History:  Past Medical History:  Diagnosis Date  . Depression   . Diabetes (HCC)    Insulin Pump  . Diabetes mellitus type I ([place])   . Diabetes mellitus without complication ([place])   . GERD (gastroesophageal reflux disease)   . H/O laryngectomy   . Hyperlipidemia   . Hypertension   . Suicide attempt [facility]) 2014   damaged larynx - tracheostomy  . Thyroid disease     Past Surgical History:  Procedure Laterality Date  . LARYNGECTOMY    . TRACHEOSTOMY  2014   from [place] attempt    Family  Psychiatric History: See intake H&P for full details. Reviewed, with no updates at this time.   Family History: History reviewed. No pertinent family history.  Social History:  Social History   Socioeconomic History  . Marital status: Single    Spouse name: Not on file  . Number of children: Not on file  . Years of education: Not on file  . Highest education level: Not on file  Occupational History  . Not on file  Social Needs  . Financial resource strain: Not on file  . Food insecurity:    Worry: Not on file    Inability: Not on file  . Transportation needs:    Medical: Not on file    Non-medical: Not on file  Tobacco Use  . Smoking status: Former Smoker    Packs/day: 0.00    Types: Cigarettes    Last attempt to quit: 04/09/2013    Years since quitting: 4.7  . Smokeless tobacco: Never Used  Substance and Sexual Activity  . Alcohol use: Yes    Alcohol/week: 2.0 standard drinks    Types: 2 Shots of liquor per week    Comment: Pt denied; BAC not available  . Drug use: No    Comment: Pt denied; UDS not available  . Sexual activity: Yes    Partners: Female    Birth control/protection: Condom  Lifestyle  .  Physical activity:    Days per week: Not on file    Minutes per session: Not on file  . Stress: Not on file  Relationships  . Social connections:    Talks on phone: Not on file    Gets together: Not on file    Attends religious service: Not on file    Active member of club or organization: Not on file    Attends meetings of clubs or organizations: Not on file    Relationship status: Not on file  Other Topics Concern  . Not on file  Social History Narrative  . Not on file    Allergies:  Allergies  Allergen Reactions  . Buspar [Buspirone]     Makes the patient "flip out"  . Depakote [Valproic Acid]     Causes excessive drowsiness  . Clopidogrel Rash  . Gabapentin Itching and Rash    Metabolic Disorder Labs: Lab Results  Component Value Date   HGBA1C  8.1 (H) 09/15/2016   MPG 186 09/15/2016   No results found for: PROLACTIN Lab Results  Component Value Date   CHOL 161 09/15/2016   TRIG 190 (H) 09/15/2016   HDL 41 09/15/2016   CHOLHDL 3.9 09/15/2016   VLDL 38 09/15/2016   LDLCALC 82 09/15/2016   Lab Results  Component Value Date   TSH 1.952 09/15/2016    Therapeutic Level Labs: No results found for: LITHIUM Lab Results  Component Value Date   VALPROATE 19 (L) 09/12/2016   No components found for:  CBMZ  Current Medications: Current Outpatient Medications  Medication Sig Dispense Refill  . ALPRAZolam (XANAX) 1 MG tablet Take 1 tablet (1 mg total) by mouth at bedtime as needed for sleep (for sleep). 30 tablet 4  . amLODipine (NORVASC) 10 MG tablet     . ARIPiprazole (ABILIFY) 2 MG tablet Take 1 tablet (2 mg total) by mouth daily. 90 tablet 1  . aspirin EC 81 MG tablet Take 1 tablet by mouth daily.    Marland Kitchen Fexofenadine HCl (ALLEGRA PO) Take 180 mg by mouth daily.     . fluticasone (FLONASE) 50 MCG/ACT nasal spray Place 2 sprays into both nostrils daily.    . fluvoxaMINE (LUVOX) 100 MG tablet Take 2 tablets (200 mg total) by mouth at bedtime. 180 tablet 1  . insulin aspart (NOVOLOG) 100 UNIT/ML injection Inject 1.15-1.25 Units into the skin See admin instructions. Via insulin pump-basal rate: 1.15-1.25 units/hr (every 8 carbs = 1 unit of insulin bolus) 0000-0800 = 1.15 units/hr and 0801/2359 = 1.25 units/hr    . levothyroxine (SYNTHROID, LEVOTHROID) 75 MCG tablet Take 75 mcg by mouth daily before breakfast.    . losartan-hydrochlorothiazide (HYZAAR) 100-25 MG tablet     . pantoprazole (PROTONIX) 40 MG tablet Take 1 tablet by mouth 2 (two) times daily. Taking one tablet daily    . simvastatin (ZOCOR) 20 MG tablet Take 1 tablet by mouth every evening.      No current facility-administered medications for this visit.     Musculoskeletal: Strength & Muscle Tone: within normal limits Gait & Station: normal Patient leans:  N/A  Psychiatric Specialty Exam: ROS  Blood pressure 140/80, pulse 74, height 6' (1.829 m), weight 260 lb (117.9 kg).Body mass index is 35.26 kg/m.  General Appearance: Casual and Fairly Groomed  Eye Contact:  Good  Speech:  trach  Volume:  Normal  Mood:  Euthymic and good  Affect:  Appropriate and Congruent  Thought Process:  Goal Directed and  Descriptions of Associations: Intact  Orientation:  Full (Time, Place, and Person)  Thought Content: Logical   Suicidal Thoughts:  No  Homicidal Thoughts:  No  Memory:  Immediate;   Good  Judgement:  Good  Insight:  Good  Psychomotor Activity:  Normal  Concentration:  Attention Span: Good  Recall:  Good  Fund of Knowledge: Good  Language: Good  Akathisia:  Negative  Handed:  Right  AIMS (if indicated): not done  Assets:  Communication Skills Desire for Improvement Financial Resources/Insurance Housing Intimacy Leisure Time [place] Talents/Skills Transportation Vocational/Educational  ADL's:  Intact  Cognition: WNL  Sleep:  Good   Screenings: AUDIT     Admission (Discharged) from 09/14/2016 in [place]  Alcohol Use Disorder Identification Test Final Score (AUDIT)  1    PHQ2-9     Patient Outreach Telephone from 12/12/2017 in [place] from 08/22/2016 in [place]  PHQ-2 Total Score  0  2  PHQ-9 Total Score  -  4       Assessment and Plan: Karthikeya Funke presents with good control of his depression and anxiety symptoms, sleeping well with the use of Xanax nightly, and the Abilify/Luvox combination seems to be quite effective for his depression and OCD symptoms.  No acute safety issues, and he recently got married and reports things are going well in his relationship.  We will follow-up in 3-4 months or sooner if needed. Disclosed to patient that this Probation officer is leaving this practice at the end of August 2019, and  patients always has the right to choose their provider. Reassured patient that office will work to provide smooth transition of care whether they wish to remain at this office, or to continue with this provider, or seek alternative care options in community.  They expressed understanding.   1. Moderate episode of recurrent major depressive disorder ([place])   2. Attention deficit hyperactivity disorder (ADHD), combined type   3. GAD (generalized anxiety disorder)     Status of current problems: stable  Labs Ordered: No orders of the defined types were placed in this encounter.   Labs Reviewed: n/a  Collateral Obtained/Records Reviewed: n/a  Plan:  Continue Abilify 2 mg daily Continue Luvox 200 mg nightly Xanax 1 mg nightly for sleep Return to clinic in 3-4 months    Aundra Dubin, MD 12/26/2017, 2:38 PM

## 2017-12-26 NOTE — Patient Instructions (Signed)
[place], [place]  12/16 at 1:30 PM

## 2017-12-27 ENCOUNTER — Ambulatory Visit: Payer: Self-pay | Admitting: Pharmacist

## 2018-01-04 ENCOUNTER — Telehealth: Payer: Self-pay | Admitting: Pharmacy Technician

## 2018-01-04 NOTE — Telephone Encounter (Signed)
Patient is not in Medicare Part D coverage gap.  Verified by Cathie Beams at [facility].  Per Cathie Beams, a referral has been made by [employer] to [facility] on behalf of patient.  Patient stated that he has not been contacted by [facility]. [place] provided patient with contact information for [facility].  Patient acknowledged that he understood that [facility] would be unable to provide medication assistance since he is not in the coverage gap.  [place] [facility]

## 2018-01-10 DIAGNOSIS — E1022 Type 1 diabetes mellitus with diabetic chronic kidney disease: Secondary | ICD-10-CM | POA: Diagnosis not present

## 2018-01-10 DIAGNOSIS — N183 Chronic kidney disease, stage 3 (moderate): Secondary | ICD-10-CM | POA: Diagnosis not present

## 2018-01-10 DIAGNOSIS — Z125 Encounter for screening for malignant neoplasm of prostate: Secondary | ICD-10-CM | POA: Diagnosis not present

## 2018-01-10 DIAGNOSIS — E039 Hypothyroidism, unspecified: Secondary | ICD-10-CM | POA: Diagnosis not present

## 2018-01-10 DIAGNOSIS — Z79899 Other long term (current) drug therapy: Secondary | ICD-10-CM | POA: Diagnosis not present

## 2018-01-10 DIAGNOSIS — E7800 Pure hypercholesterolemia, unspecified: Secondary | ICD-10-CM | POA: Diagnosis not present

## 2018-01-15 DIAGNOSIS — Z7982 Long term (current) use of aspirin: Secondary | ICD-10-CM | POA: Diagnosis not present

## 2018-01-15 DIAGNOSIS — R491 Aphonia: Secondary | ICD-10-CM | POA: Diagnosis not present

## 2018-01-15 DIAGNOSIS — Z9002 Acquired absence of larynx: Secondary | ICD-10-CM | POA: Diagnosis not present

## 2018-01-15 DIAGNOSIS — I10 Essential (primary) hypertension: Secondary | ICD-10-CM | POA: Diagnosis not present

## 2018-01-15 DIAGNOSIS — J398 Other specified diseases of upper respiratory tract: Secondary | ICD-10-CM | POA: Diagnosis not present

## 2018-01-15 DIAGNOSIS — Z794 Long term (current) use of insulin: Secondary | ICD-10-CM | POA: Diagnosis not present

## 2018-01-15 DIAGNOSIS — Z87891 Personal history of nicotine dependence: Secondary | ICD-10-CM | POA: Diagnosis not present

## 2018-01-15 DIAGNOSIS — E119 Type 2 diabetes mellitus without complications: Secondary | ICD-10-CM | POA: Diagnosis not present

## 2018-01-17 DIAGNOSIS — Z0000 Encounter for general adult medical examination without abnormal findings: Secondary | ICD-10-CM | POA: Diagnosis not present

## 2018-01-17 DIAGNOSIS — Z430 Encounter for attention to tracheostomy: Secondary | ICD-10-CM | POA: Diagnosis not present

## 2018-01-20 DIAGNOSIS — E1065 Type 1 diabetes mellitus with hyperglycemia: Secondary | ICD-10-CM | POA: Diagnosis not present

## 2018-01-20 DIAGNOSIS — R220 Localized swelling, mass and lump, head: Secondary | ICD-10-CM | POA: Diagnosis not present

## 2018-01-20 DIAGNOSIS — J9620 Acute and chronic respiratory failure, unspecified whether with hypoxia or hypercapnia: Secondary | ICD-10-CM | POA: Diagnosis not present

## 2018-01-20 DIAGNOSIS — I639 Cerebral infarction, unspecified: Secondary | ICD-10-CM | POA: Diagnosis not present

## 2018-01-20 DIAGNOSIS — Z930 Tracheostomy status: Secondary | ICD-10-CM | POA: Diagnosis not present

## 2018-01-23 DIAGNOSIS — E1029 Type 1 diabetes mellitus with other diabetic kidney complication: Secondary | ICD-10-CM | POA: Diagnosis not present

## 2018-01-23 DIAGNOSIS — R809 Proteinuria, unspecified: Secondary | ICD-10-CM | POA: Diagnosis not present

## 2018-01-23 DIAGNOSIS — E785 Hyperlipidemia, unspecified: Secondary | ICD-10-CM | POA: Diagnosis not present

## 2018-01-23 DIAGNOSIS — I10 Essential (primary) hypertension: Secondary | ICD-10-CM | POA: Diagnosis not present

## 2018-01-23 DIAGNOSIS — E1159 Type 2 diabetes mellitus with other circulatory complications: Secondary | ICD-10-CM | POA: Diagnosis not present

## 2018-01-23 DIAGNOSIS — E1069 Type 1 diabetes mellitus with other specified complication: Secondary | ICD-10-CM | POA: Diagnosis not present

## 2018-01-23 DIAGNOSIS — E1065 Type 1 diabetes mellitus with hyperglycemia: Secondary | ICD-10-CM | POA: Diagnosis not present

## 2018-02-07 DIAGNOSIS — Z1211 Encounter for screening for malignant neoplasm of colon: Secondary | ICD-10-CM | POA: Diagnosis not present

## 2018-02-08 DIAGNOSIS — I10 Essential (primary) hypertension: Secondary | ICD-10-CM | POA: Diagnosis not present

## 2018-02-08 DIAGNOSIS — E039 Hypothyroidism, unspecified: Secondary | ICD-10-CM | POA: Diagnosis not present

## 2018-02-08 DIAGNOSIS — Z87891 Personal history of nicotine dependence: Secondary | ICD-10-CM | POA: Diagnosis not present

## 2018-02-08 DIAGNOSIS — T85638A Leakage of other specified internal prosthetic devices, implants and grafts, initial encounter: Secondary | ICD-10-CM | POA: Diagnosis not present

## 2018-02-08 DIAGNOSIS — E119 Type 2 diabetes mellitus without complications: Secondary | ICD-10-CM | POA: Diagnosis not present

## 2018-02-08 DIAGNOSIS — Z7982 Long term (current) use of aspirin: Secondary | ICD-10-CM | POA: Diagnosis not present

## 2018-02-08 DIAGNOSIS — Z7951 Long term (current) use of inhaled steroids: Secondary | ICD-10-CM | POA: Diagnosis not present

## 2018-02-08 DIAGNOSIS — F329 Major depressive disorder, single episode, unspecified: Secondary | ICD-10-CM | POA: Diagnosis not present

## 2018-02-08 DIAGNOSIS — Z86718 Personal history of other venous thrombosis and embolism: Secondary | ICD-10-CM | POA: Diagnosis not present

## 2018-02-08 DIAGNOSIS — Z9002 Acquired absence of larynx: Secondary | ICD-10-CM | POA: Diagnosis not present

## 2018-02-08 DIAGNOSIS — Z888 Allergy status to other drugs, medicaments and biological substances status: Secondary | ICD-10-CM | POA: Diagnosis not present

## 2018-02-08 DIAGNOSIS — Z8249 Family history of ischemic heart disease and other diseases of the circulatory system: Secondary | ICD-10-CM | POA: Diagnosis not present

## 2018-02-08 DIAGNOSIS — Z794 Long term (current) use of insulin: Secondary | ICD-10-CM | POA: Diagnosis not present

## 2018-02-08 DIAGNOSIS — Z833 Family history of diabetes mellitus: Secondary | ICD-10-CM | POA: Diagnosis not present

## 2018-02-08 DIAGNOSIS — Z79899 Other long term (current) drug therapy: Secondary | ICD-10-CM | POA: Diagnosis not present

## 2018-02-08 DIAGNOSIS — I69351 Hemiplegia and hemiparesis following cerebral infarction affecting right dominant side: Secondary | ICD-10-CM | POA: Diagnosis not present

## 2018-02-15 DIAGNOSIS — E1065 Type 1 diabetes mellitus with hyperglycemia: Secondary | ICD-10-CM | POA: Diagnosis not present

## 2018-02-19 DIAGNOSIS — I639 Cerebral infarction, unspecified: Secondary | ICD-10-CM | POA: Diagnosis not present

## 2018-02-19 DIAGNOSIS — Z930 Tracheostomy status: Secondary | ICD-10-CM | POA: Diagnosis not present

## 2018-02-19 DIAGNOSIS — J9620 Acute and chronic respiratory failure, unspecified whether with hypoxia or hypercapnia: Secondary | ICD-10-CM | POA: Diagnosis not present

## 2018-02-19 DIAGNOSIS — R220 Localized swelling, mass and lump, head: Secondary | ICD-10-CM | POA: Diagnosis not present

## 2018-02-19 DIAGNOSIS — E1065 Type 1 diabetes mellitus with hyperglycemia: Secondary | ICD-10-CM | POA: Diagnosis not present

## 2018-03-05 DIAGNOSIS — Z448 Encounter for fitting and adjustment of other external prosthetic devices: Secondary | ICD-10-CM | POA: Diagnosis not present

## 2018-03-05 DIAGNOSIS — Z9002 Acquired absence of larynx: Secondary | ICD-10-CM | POA: Diagnosis not present

## 2018-03-05 DIAGNOSIS — Z8521 Personal history of malignant neoplasm of larynx: Secondary | ICD-10-CM | POA: Diagnosis not present

## 2018-03-07 DIAGNOSIS — R195 Other fecal abnormalities: Secondary | ICD-10-CM | POA: Diagnosis not present

## 2018-03-07 DIAGNOSIS — R809 Proteinuria, unspecified: Secondary | ICD-10-CM | POA: Diagnosis not present

## 2018-03-07 DIAGNOSIS — Z930 Tracheostomy status: Secondary | ICD-10-CM | POA: Diagnosis not present

## 2018-03-07 DIAGNOSIS — E1029 Type 1 diabetes mellitus with other diabetic kidney complication: Secondary | ICD-10-CM | POA: Diagnosis not present

## 2018-03-07 DIAGNOSIS — K219 Gastro-esophageal reflux disease without esophagitis: Secondary | ICD-10-CM | POA: Diagnosis not present

## 2018-03-22 DIAGNOSIS — R220 Localized swelling, mass and lump, head: Secondary | ICD-10-CM | POA: Diagnosis not present

## 2018-03-22 DIAGNOSIS — E1065 Type 1 diabetes mellitus with hyperglycemia: Secondary | ICD-10-CM | POA: Diagnosis not present

## 2018-03-22 DIAGNOSIS — Z930 Tracheostomy status: Secondary | ICD-10-CM | POA: Diagnosis not present

## 2018-03-22 DIAGNOSIS — J9620 Acute and chronic respiratory failure, unspecified whether with hypoxia or hypercapnia: Secondary | ICD-10-CM | POA: Diagnosis not present

## 2018-03-22 DIAGNOSIS — I639 Cerebral infarction, unspecified: Secondary | ICD-10-CM | POA: Diagnosis not present

## 2018-03-26 DIAGNOSIS — Z963 Presence of artificial larynx: Secondary | ICD-10-CM | POA: Diagnosis not present

## 2018-03-26 DIAGNOSIS — Z8521 Personal history of malignant neoplasm of larynx: Secondary | ICD-10-CM | POA: Diagnosis not present

## 2018-03-26 DIAGNOSIS — Z9002 Acquired absence of larynx: Secondary | ICD-10-CM | POA: Diagnosis not present

## 2018-03-26 DIAGNOSIS — Z08 Encounter for follow-up examination after completed treatment for malignant neoplasm: Secondary | ICD-10-CM | POA: Diagnosis not present

## 2018-04-01 DIAGNOSIS — Z430 Encounter for attention to tracheostomy: Secondary | ICD-10-CM | POA: Diagnosis not present

## 2018-04-01 DIAGNOSIS — Z930 Tracheostomy status: Secondary | ICD-10-CM | POA: Diagnosis not present

## 2018-04-09 DIAGNOSIS — J398 Other specified diseases of upper respiratory tract: Secondary | ICD-10-CM | POA: Diagnosis not present

## 2018-04-09 DIAGNOSIS — Z9002 Acquired absence of larynx: Secondary | ICD-10-CM | POA: Diagnosis not present

## 2018-04-09 DIAGNOSIS — R491 Aphonia: Secondary | ICD-10-CM | POA: Diagnosis not present

## 2018-04-09 DIAGNOSIS — Z6835 Body mass index (BMI) 35.0-35.9, adult: Secondary | ICD-10-CM | POA: Diagnosis not present

## 2018-04-09 DIAGNOSIS — Z462 Encounter for fitting and adjustment of other devices related to nervous system and special senses: Secondary | ICD-10-CM | POA: Diagnosis not present

## 2018-04-18 DIAGNOSIS — E1159 Type 2 diabetes mellitus with other circulatory complications: Secondary | ICD-10-CM | POA: Diagnosis not present

## 2018-04-18 DIAGNOSIS — R809 Proteinuria, unspecified: Secondary | ICD-10-CM | POA: Diagnosis not present

## 2018-04-18 DIAGNOSIS — E1029 Type 1 diabetes mellitus with other diabetic kidney complication: Secondary | ICD-10-CM | POA: Diagnosis not present

## 2018-04-18 DIAGNOSIS — K921 Melena: Secondary | ICD-10-CM | POA: Diagnosis not present

## 2018-04-18 DIAGNOSIS — Z930 Tracheostomy status: Secondary | ICD-10-CM | POA: Diagnosis not present

## 2018-04-18 DIAGNOSIS — I10 Essential (primary) hypertension: Secondary | ICD-10-CM | POA: Diagnosis not present

## 2018-04-21 DIAGNOSIS — R220 Localized swelling, mass and lump, head: Secondary | ICD-10-CM | POA: Diagnosis not present

## 2018-04-21 DIAGNOSIS — Z930 Tracheostomy status: Secondary | ICD-10-CM | POA: Diagnosis not present

## 2018-04-21 DIAGNOSIS — E1065 Type 1 diabetes mellitus with hyperglycemia: Secondary | ICD-10-CM | POA: Diagnosis not present

## 2018-04-21 DIAGNOSIS — I639 Cerebral infarction, unspecified: Secondary | ICD-10-CM | POA: Diagnosis not present

## 2018-04-21 DIAGNOSIS — J9620 Acute and chronic respiratory failure, unspecified whether with hypoxia or hypercapnia: Secondary | ICD-10-CM | POA: Diagnosis not present

## 2018-04-22 DIAGNOSIS — F3341 Major depressive disorder, recurrent, in partial remission: Secondary | ICD-10-CM | POA: Diagnosis not present

## 2018-04-22 DIAGNOSIS — F411 Generalized anxiety disorder: Secondary | ICD-10-CM | POA: Diagnosis not present

## 2018-04-22 DIAGNOSIS — F5101 Primary insomnia: Secondary | ICD-10-CM | POA: Diagnosis not present

## 2018-04-23 DIAGNOSIS — F419 Anxiety disorder, unspecified: Secondary | ICD-10-CM | POA: Diagnosis not present

## 2018-04-23 DIAGNOSIS — N189 Chronic kidney disease, unspecified: Secondary | ICD-10-CM | POA: Diagnosis not present

## 2018-04-23 DIAGNOSIS — J392 Other diseases of pharynx: Secondary | ICD-10-CM | POA: Diagnosis not present

## 2018-04-23 DIAGNOSIS — J9503 Malfunction of tracheostomy stoma: Secondary | ICD-10-CM | POA: Diagnosis not present

## 2018-04-23 DIAGNOSIS — Z981 Arthrodesis status: Secondary | ICD-10-CM | POA: Diagnosis not present

## 2018-04-23 DIAGNOSIS — J398 Other specified diseases of upper respiratory tract: Secondary | ICD-10-CM | POA: Diagnosis not present

## 2018-04-23 DIAGNOSIS — Z87891 Personal history of nicotine dependence: Secondary | ICD-10-CM | POA: Diagnosis not present

## 2018-04-23 DIAGNOSIS — E039 Hypothyroidism, unspecified: Secondary | ICD-10-CM | POA: Diagnosis not present

## 2018-04-23 DIAGNOSIS — Z8673 Personal history of transient ischemic attack (TIA), and cerebral infarction without residual deficits: Secondary | ICD-10-CM | POA: Diagnosis not present

## 2018-04-23 DIAGNOSIS — K219 Gastro-esophageal reflux disease without esophagitis: Secondary | ICD-10-CM | POA: Diagnosis not present

## 2018-04-23 DIAGNOSIS — Z9641 Presence of insulin pump (external) (internal): Secondary | ICD-10-CM | POA: Diagnosis not present

## 2018-04-23 DIAGNOSIS — I129 Hypertensive chronic kidney disease with stage 1 through stage 4 chronic kidney disease, or unspecified chronic kidney disease: Secondary | ICD-10-CM | POA: Diagnosis not present

## 2018-04-23 DIAGNOSIS — E7800 Pure hypercholesterolemia, unspecified: Secondary | ICD-10-CM | POA: Diagnosis not present

## 2018-04-23 DIAGNOSIS — Z86718 Personal history of other venous thrombosis and embolism: Secondary | ICD-10-CM | POA: Diagnosis not present

## 2018-04-23 DIAGNOSIS — Z9002 Acquired absence of larynx: Secondary | ICD-10-CM | POA: Diagnosis not present

## 2018-04-23 DIAGNOSIS — E1022 Type 1 diabetes mellitus with diabetic chronic kidney disease: Secondary | ICD-10-CM | POA: Diagnosis not present

## 2018-05-03 DIAGNOSIS — Z430 Encounter for attention to tracheostomy: Secondary | ICD-10-CM | POA: Diagnosis not present

## 2018-05-03 DIAGNOSIS — Z930 Tracheostomy status: Secondary | ICD-10-CM | POA: Diagnosis not present

## 2018-05-10 DIAGNOSIS — Z9002 Acquired absence of larynx: Secondary | ICD-10-CM | POA: Diagnosis not present

## 2018-05-10 DIAGNOSIS — Z448 Encounter for fitting and adjustment of other external prosthetic devices: Secondary | ICD-10-CM | POA: Diagnosis not present

## 2018-05-10 DIAGNOSIS — T8131XA Disruption of external operation (surgical) wound, not elsewhere classified, initial encounter: Secondary | ICD-10-CM | POA: Diagnosis not present

## 2018-05-10 DIAGNOSIS — L859 Epidermal thickening, unspecified: Secondary | ICD-10-CM | POA: Diagnosis not present

## 2018-05-10 DIAGNOSIS — Z48815 Encounter for surgical aftercare following surgery on the digestive system: Secondary | ICD-10-CM | POA: Diagnosis not present

## 2018-05-14 ENCOUNTER — Encounter: Payer: Self-pay | Admitting: *Deleted

## 2018-05-15 ENCOUNTER — Ambulatory Visit: Payer: PPO | Admitting: Anesthesiology

## 2018-05-15 ENCOUNTER — Other Ambulatory Visit: Payer: Self-pay

## 2018-05-15 ENCOUNTER — Encounter
Admission: RE | Disposition: A | Discharge status: Home / Self Care | Payer: Self-pay | Source: Home / Self Care | Attending: Internal Medicine

## 2018-05-15 ENCOUNTER — Ambulatory Visit
Admission: RE | Admit: 2018-05-15 | Discharge: 2018-05-15 | Disposition: A | Discharge status: Home / Self Care | Payer: PPO | Attending: Internal Medicine | Admitting: Internal Medicine

## 2018-05-15 ENCOUNTER — Encounter: Payer: Self-pay | Admitting: *Deleted

## 2018-05-15 DIAGNOSIS — Z888 Allergy status to other drugs, medicaments and biological substances status: Secondary | ICD-10-CM | POA: Insufficient documentation

## 2018-05-15 DIAGNOSIS — Z8673 Personal history of transient ischemic attack (TIA), and cerebral infarction without residual deficits: Secondary | ICD-10-CM | POA: Diagnosis not present

## 2018-05-15 DIAGNOSIS — K219 Gastro-esophageal reflux disease without esophagitis: Secondary | ICD-10-CM | POA: Insufficient documentation

## 2018-05-15 DIAGNOSIS — K449 Diaphragmatic hernia without obstruction or gangrene: Secondary | ICD-10-CM | POA: Insufficient documentation

## 2018-05-15 DIAGNOSIS — Z7982 Long term (current) use of aspirin: Secondary | ICD-10-CM | POA: Insufficient documentation

## 2018-05-15 DIAGNOSIS — K228 Other specified diseases of esophagus: Secondary | ICD-10-CM | POA: Insufficient documentation

## 2018-05-15 DIAGNOSIS — E785 Hyperlipidemia, unspecified: Secondary | ICD-10-CM | POA: Insufficient documentation

## 2018-05-15 DIAGNOSIS — E109 Type 1 diabetes mellitus without complications: Secondary | ICD-10-CM | POA: Diagnosis not present

## 2018-05-15 DIAGNOSIS — I10 Essential (primary) hypertension: Secondary | ICD-10-CM | POA: Insufficient documentation

## 2018-05-15 DIAGNOSIS — R195 Other fecal abnormalities: Secondary | ICD-10-CM | POA: Diagnosis not present

## 2018-05-15 DIAGNOSIS — K640 First degree hemorrhoids: Secondary | ICD-10-CM | POA: Diagnosis not present

## 2018-05-15 DIAGNOSIS — K208 Other esophagitis: Secondary | ICD-10-CM | POA: Diagnosis not present

## 2018-05-15 DIAGNOSIS — Z79899 Other long term (current) drug therapy: Secondary | ICD-10-CM | POA: Diagnosis not present

## 2018-05-15 DIAGNOSIS — F329 Major depressive disorder, single episode, unspecified: Secondary | ICD-10-CM | POA: Diagnosis not present

## 2018-05-15 DIAGNOSIS — E119 Type 2 diabetes mellitus without complications: Secondary | ICD-10-CM | POA: Diagnosis not present

## 2018-05-15 DIAGNOSIS — K648 Other hemorrhoids: Secondary | ICD-10-CM | POA: Diagnosis not present

## 2018-05-15 DIAGNOSIS — Z79890 Hormone replacement therapy (postmenopausal): Secondary | ICD-10-CM | POA: Insufficient documentation

## 2018-05-15 DIAGNOSIS — E039 Hypothyroidism, unspecified: Secondary | ICD-10-CM | POA: Diagnosis not present

## 2018-05-15 DIAGNOSIS — Z87891 Personal history of nicotine dependence: Secondary | ICD-10-CM | POA: Diagnosis not present

## 2018-05-15 DIAGNOSIS — Z9641 Presence of insulin pump (external) (internal): Secondary | ICD-10-CM | POA: Insufficient documentation

## 2018-05-15 DIAGNOSIS — Z794 Long term (current) use of insulin: Secondary | ICD-10-CM | POA: Insufficient documentation

## 2018-05-15 HISTORY — PX: COLONOSCOPY WITH PROPOFOL: SHX5780

## 2018-05-15 HISTORY — DX: Unspecified fracture of unspecified calcaneus, initial encounter for closed fracture: S92.009A

## 2018-05-15 HISTORY — DX: Cerebral infarction, unspecified: I63.9

## 2018-05-15 HISTORY — PX: ESOPHAGOGASTRODUODENOSCOPY: SHX5428

## 2018-05-15 HISTORY — DX: Radiculopathy, site unspecified: M54.10

## 2018-05-15 LAB — GLUCOSE, CAPILLARY: Glucose-Capillary: 128 mg/dL — ABNORMAL HIGH (ref 70–99)

## 2018-05-15 SURGERY — EGD (ESOPHAGOGASTRODUODENOSCOPY)
Anesthesia: General

## 2018-05-15 MED ORDER — SODIUM CHLORIDE 0.9 % IV SOLN
INTRAVENOUS | Status: DC
  Administered 2018-05-15: 09:00:00 via INTRAVENOUS

## 2018-05-15 MED ORDER — PROPOFOL 500 MG/50ML IV EMUL
INTRAVENOUS | Status: AC
  Filled 2018-05-15: qty 50

## 2018-05-15 MED ORDER — PROPOFOL 500 MG/50ML IV EMUL
INTRAVENOUS | Status: DC | PRN
  Administered 2018-05-15: 5 ug/kg/min via INTRAVENOUS

## 2018-05-15 MED ORDER — PROPOFOL 10 MG/ML IV BOLUS
INTRAVENOUS | Status: DC | PRN
  Administered 2018-05-15: 20 mg via INTRAVENOUS
  Administered 2018-05-15: 50 mg via INTRAVENOUS
  Administered 2018-05-15: 10 mg via INTRAVENOUS

## 2018-05-15 NOTE — Transfer of Care (Signed)
Immediate Anesthesia Transfer of Care Note  Patient: [name]  Procedure(s) Performed: ESOPHAGOGASTRODUODENOSCOPY (EGD) (N/A ) COLONOSCOPY WITH PROPOFOL (N/A )  Patient Location: PACU and Endoscopy Unit  Anesthesia Type:General  Level of Consciousness: awake, drowsy and responds to stimulation  Airway & Oxygen Therapy: Patient connected to face mask oxygen  Post-op Assessment: Report given to RN and Post -op Vital signs reviewed and stable  Post vital signs: Reviewed and stable  Last Vitals:  Vitals Value Taken Time  BP 112/71 05/15/2018  9:32 AM  Temp 35.8 C 05/15/2018  9:32 AM  Pulse 61 05/15/2018  9:35 AM  Resp 15 05/15/2018  9:35 AM  SpO2 98 % 05/15/2018  9:35 AM  Vitals shown include unvalidated device data.  Last Pain:  Vitals:   05/15/18 0932  TempSrc: Tympanic  PainSc: Asleep         Complications: No apparent anesthesia complications

## 2018-05-15 NOTE — Anesthesia Post-op Follow-up Note (Signed)
Anesthesia QCDR form completed.        

## 2018-05-15 NOTE — Interval H&P Note (Signed)
History and Physical Interval Note:  05/15/2018 8:44 AM  [name]  has presented today for surgery, with the diagnosis of GERD,HEME+ STOOLS  The various methods of treatment have been discussed with the patient and family. After consideration of risks, benefits and other options for treatment, the patient has consented to  Procedure(s): ESOPHAGOGASTRODUODENOSCOPY (EGD) (N/A) COLONOSCOPY WITH PROPOFOL (N/A) as a surgical intervention .  The patient's history has been reviewed, patient examined, no change in status, stable for surgery.  I have reviewed the patient's chart and labs.  Questions were answered to the patient's satisfaction.     [place], [place]

## 2018-05-15 NOTE — Op Note (Signed)
[facility] Gastroenterology Patient Name: [name] Procedure Date: 05/15/2018 8:41 AM MRN: [id number] Account #: [id number] Date of Birth: 1961-01-10 Admit Type: Outpatient Age: 58 Room: [facility] ENDO ROOM 3 Gender: Male Note Status: Finalized Procedure:            Colonoscopy Indications:          Heme positive stool Providers:            [name]. Alice Reichert MD, MD Referring MD:         Caprice Renshaw MD (Referring MD) Medicines:            Propofol per Anesthesia Complications:        No immediate complications. Procedure:            Pre-Anesthesia Assessment:                       - The risks and benefits of the procedure and the                        sedation options and risks were discussed with the                        patient. All questions were answered and informed                        consent was obtained.                       - Patient identification and proposed procedure were                        verified prior to the procedure by the nurse. The                        procedure was verified in the procedure room.                       - ASA Grade Assessment: III - A patient with severe                        systemic disease.                       - After reviewing the risks and benefits, the patient                        was deemed in satisfactory condition to undergo the                        procedure.                       After obtaining informed consent, the colonoscope was                        passed under direct vision. Throughout the procedure,                        the patient's blood pressure, pulse, and oxygen  saturations were monitored continuously. The                        Colonoscope was introduced through the anus and                        advanced to the the cecum, identified by appendiceal                        orifice and ileocecal valve. The colonoscopy was                        somewhat  difficult due to poor endoscopic                        visualization. Successful completion of the procedure                        was aided by lavage. The patient tolerated the                        procedure well. The quality of the bowel preparation                        was fair. The ileocecal valve, appendiceal orifice, and                        rectum were photographed. Findings:      The perianal and digital rectal examinations were normal. Pertinent       negatives include normal sphincter tone and no palpable rectal lesions.      A large amount of liquid stool was found in the sigmoid colon and in the       descending colon, making visualization difficult. Lavage of the area was       performed using 50 - 200 mL of sterile water, resulting in clearance       with fair visualization.      Non-bleeding internal hemorrhoids were found during retroflexion. The       hemorrhoids were Grade I (internal hemorrhoids that do not prolapse).      The exam was otherwise without abnormality. Impression:           - Preparation of the colon was fair.                       - Stool in the sigmoid colon and in the descending                        colon.                       - Non-bleeding internal hemorrhoids.                       - The examination was otherwise normal.                       - No specimens collected. Recommendation:       - Patient has a contact number available for  emergencies. The signs and symptoms of potential                        delayed complications were discussed with the patient.                        Return to normal activities tomorrow. Written discharge                        instructions were provided to the patient.                       - Continue present medications.                       - Await pathology results.                       - Repeat colonoscopy in 5 years for screening purposes.                       - Return to GI  office PRN.                       - The findings and recommendations were discussed with                        the patient and their family. Procedure Code(s):    --- Professional ---                       [phone], Colonoscopy, flexible; diagnostic, including                        collection of specimen(s) by brushing or washing, when                        performed (separate procedure) Diagnosis Code(s):    --- Professional ---                       R19.5, Other fecal abnormalities                       K64.0, First degree hemorrhoids CPT copyright 2018 American Medical Association. All rights reserved. The codes documented in this report are preliminary and upon coder review may  be revised to meet current compliance requirements. Efrain Sella MD, MD 05/15/2018 9:31:21 AM This report has been signed electronically. Number of Addenda: 0 Note Initiated On: 05/15/2018 8:41 AM Scope Withdrawal Time: 0 hours 14 minutes 3 seconds  Total Procedure Duration: 0 hours 22 minutes 41 seconds       [facility]

## 2018-05-15 NOTE — OR Nursing (Addendum)
Pt suctioned self with 14 french catheter, white colored fluid noted int tube. Pt tolerated suctioning well.

## 2018-05-15 NOTE — Anesthesia Postprocedure Evaluation (Signed)
Anesthesia Post Note  Patient: [name]  Procedure(s) Performed: ESOPHAGOGASTRODUODENOSCOPY (EGD) (N/A ) COLONOSCOPY WITH PROPOFOL (N/A )  Patient location during evaluation: Endoscopy Anesthesia Type: General Level of consciousness: awake and alert and oriented Pain management: pain level controlled Vital Signs Assessment: post-procedure vital signs reviewed and stable Respiratory status: spontaneous breathing, nonlabored ventilation and respiratory function stable Cardiovascular status: blood pressure returned to baseline and stable Postop Assessment: no signs of nausea or vomiting Anesthetic complications: no     Last Vitals:  Vitals:   05/15/18 1002 05/15/18 1012  BP: 138/90 130/69  Pulse: 66 (!) 57  Resp: 20 14  Temp:    SpO2: 95% 98%    Last Pain:  Vitals:   05/15/18 1012  TempSrc:   PainSc: 5                  Gurnoor Ursua

## 2018-05-15 NOTE — Op Note (Signed)
[facility] Gastroenterology Patient Name: [name] Procedure Date: 05/15/2018 8:43 AM MRN: [id number] Account #: [id number] Date of Birth: [birth year] Admit Type: Outpatient Age: 58 Room: [place] ENDO ROOM 3 Gender: Male Note Status: Finalized Procedure:            Upper GI endoscopy Indications:          Follow-up of esophageal reflux Providers:            [name]. Alice Reichert MD, MD Referring MD:         Derinda Late MD (Referring MD) Medicines:            Propofol per Anesthesia Complications:        No immediate complications. Procedure:            Pre-Anesthesia Assessment:                       - The risks and benefits of the procedure and the                        sedation options and risks were discussed with the                        patient. All questions were answered and informed                        consent was obtained.                       - Patient identification and proposed procedure were                        verified prior to the procedure by the nurse. The                        procedure was verified in the procedure room.                       - ASA Grade Assessment: III - A patient with severe                        systemic disease.                       - After reviewing the risks and benefits, the patient                        was deemed in satisfactory condition to undergo the                        procedure.                       After obtaining informed consent, the endoscope was                        passed under direct vision. Throughout the procedure,                        the patient's blood pressure, pulse, and oxygen  saturations were monitored continuously. The Endoscope                        was introduced through the mouth, and advanced to the                        third part of duodenum. The upper GI endoscopy was                        accomplished without difficulty. The patient tolerated                         the procedure well. Findings:      The Z-line was irregular and was found at the gastroesophageal junction.       Mucosa was biopsied with a cold forceps for histology. One specimen       bottle was sent to pathology.      A 1 cm hiatal hernia was present.      The examined duodenum was normal.      The exam was otherwise without abnormality. Impression:           - Z-line irregular, at the gastroesophageal junction.                        Biopsied.                       - 1 cm hiatal hernia.                       - Normal examined duodenum.                       - The examination was otherwise normal. Recommendation:       - Await pathology results.                       - Proceed with colonoscopy Procedure Code(s):    --- Professional ---                       [phone], Esophagogastroduodenoscopy, flexible, transoral;                        with biopsy, single or multiple Diagnosis Code(s):    --- Professional ---                       K21.9, Gastro-esophageal reflux disease without                        esophagitis                       K44.9, Diaphragmatic hernia without obstruction or                        gangrene                       K22.8, Other specified diseases of esophagus CPT copyright 2018 American Medical Association. All rights reserved. The codes documented in this report are preliminary and upon coder review may  be revised to meet current compliance requirements. Efrain Sella MD, MD  05/15/2018 9:03:41 AM This report has been signed electronically. Number of Addenda: 0 Note Initiated On: 05/15/2018 8:43 AM      [facility]

## 2018-05-15 NOTE — Anesthesia Preprocedure Evaluation (Signed)
Anesthesia Evaluation  Patient identified by MRN, date of birth, ID band Patient awake    Reviewed: Allergy & Precautions, NPO status , Patient's Chart, lab work & pertinent test results  History of Anesthesia Complications Negative for: history of anesthetic complications  Airway Mallampati: II  TM Distance: >3 FB Neck ROM: Full    Dental no notable dental hx.    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 on medications (-) CAD, (-) Past MI, (-) Cardiac Stents and (-) CABG  Rhythm:Regular Rate:Normal - Systolic murmurs and - Diastolic murmurs    Neuro/Psych PSYCHIATRIC DISORDERS Depression CVA    GI/Hepatic Neg liver ROS, GERD  ,  Endo/Other  diabetes, Type 1, Insulin DependentHypothyroidism   Renal/GU negative Renal ROS     Musculoskeletal negative musculoskeletal ROS (+)   Abdominal (+) + obese,   Peds  Hematology negative hematology ROS (+)   Anesthesia Other Findings Past Medical History: No date: Depression No date: Diabetes (HCC)     Comment:  Insulin Pump No date: Diabetes mellitus type I (HCC) No date: Diabetes mellitus without complication (HCC) No date: GERD (gastroesophageal reflux disease) No date: H/O laryngectomy No date: Heel bone fracture No date: Hyperlipidemia No date: Hypertension No date: Radicular pain of right lower extremity No date: Stroke [facility]) 2014: Suicide attempt ([place])     Comment:  damaged larynx - tracheostomy No date: Thyroid disease   Reproductive/Obstetrics                             Anesthesia Physical Anesthesia Plan  ASA: III  Anesthesia Plan: General   Post-op Pain Management:    Induction: Intravenous  PONV Risk Score and Plan: 1 and Propofol infusion  Airway Management Planned:  Tracheostomy  Additional Equipment:   Intra-op Plan:   Post-operative Plan:   Informed Consent: I have reviewed the patients History and Physical, chart, labs and discussed the procedure including the risks, benefits and alternatives for the proposed anesthesia with the patient or authorized representative who has indicated his/her understanding and acceptance.   Dental advisory given  Plan Discussed with: CRNA and Anesthesiologist  Anesthesia Plan Comments:         Anesthesia Quick Evaluation

## 2018-05-15 NOTE — H&P (Signed)
Outpatient short stay form Pre-procedure 05/15/2018 8:42 AM Teodoro K. Alice Reichert, M.D.  Primary Physician: Derinda Late, M.D.  Reason for visit:  GERD, hemoccult positive stool.  History of present illness:  Patient is a 58 y/o male presenting with chronic GERD controlled with Pantoprazole 40mg /d. Also had routine PE with heme positive stool discovered. Patient denies change in bowel habits, rectal bleeding, weight loss or abdominal pain.  S/p laryngectomy for subglottic stenosis.    Current Facility-Administered Medications:  .  0.9 %  sodium chloride infusion, , Intravenous, Continuous, Bruno, [name], MD, Last Rate: 20 mL/hr at 05/15/18 4008  Medications Prior to Admission  Medication Sig Dispense Refill Last Dose  . ALPRAZolam (XANAX) 1 MG tablet Take 1 tablet (1 mg total) by mouth at bedtime as needed for sleep (for sleep). 30 tablet 4 05/14/2018 at 2200  . amLODipine (NORVASC) 10 MG tablet    05/15/2018 at 0600  . ARIPiprazole (ABILIFY) 2 MG tablet Take 1 tablet (2 mg total) by mouth daily. 90 tablet 1 05/15/2018 at 0600  . aspirin EC 81 MG tablet Take 1 tablet by mouth daily.   05/14/2018 at 0600  . empagliflozin (JARDIANCE) 25 MG TABS tablet Take 25 mg by mouth daily.   05/15/2018 at 0600  . Fexofenadine HCl (ALLEGRA PO) Take 180 mg by mouth daily.    Past Week at Unknown time  . fluticasone (FLONASE) 50 MCG/ACT nasal spray Place 2 sprays into both nostrils daily.   Past Week at Unknown time  . fluvoxaMINE (LUVOX) 100 MG tablet Take 2 tablets (200 mg total) by mouth at bedtime. 180 tablet 1 05/14/2018 at 2200  . insulin aspart (NOVOLOG) 100 UNIT/ML injection Inject 1.15-1.25 Units into the skin See admin instructions. Via insulin pump-basal rate: 1.15-1.25 units/hr (every 8 carbs = 1 unit of insulin bolus) 0000-0800 = 1.15 units/hr and 0801/2359 = 1.25 units/hr   05/15/2018 at Unknown time  . levothyroxine (SYNTHROID, LEVOTHROID) 75 MCG tablet Take 75 mcg by mouth daily before breakfast.    05/15/2018 at 0600  . losartan-hydrochlorothiazide (HYZAAR) 100-25 MG tablet    05/15/2018 at 0600  . meloxicam (MOBIC) 15 MG tablet Take 15 mg by mouth as needed for pain.   05/15/2018 at 0600  . Multiple Vitamin (MULTIVITAMIN) tablet Take 1 tablet by mouth daily.   Past Week at Unknown time  . pantoprazole (PROTONIX) 40 MG tablet Take 1 tablet by mouth 2 (two) times daily. Taking one tablet daily   05/15/2018 at 0600  . simvastatin (ZOCOR) 20 MG tablet Take 1 tablet by mouth every evening.    05/14/2018 at 2200  . tobramycin-dexamethasone (TOBRADEX) ophthalmic ointment 1 application 3 (three) times daily.   05/14/2018 at Unknown time  . vardenafil (LEVITRA) 20 MG tablet Take 20 mg by mouth daily as needed for erectile dysfunction.   05/14/2018 at Unknown time     Allergies  Allergen Reactions  . Buspar [Buspirone]     Makes the patient "flip out"  . Depakote [Valproic Acid]     Causes excessive drowsiness  . Clopidogrel Rash  . Gabapentin Itching and Rash     Past Medical History:  Diagnosis Date  . Depression   . Diabetes (HCC)    Insulin Pump  . Diabetes mellitus type I ([place])   . Diabetes mellitus without complication ([place])   . GERD (gastroesophageal reflux disease)   . H/O laryngectomy   . Heel bone fracture   . Hyperlipidemia   . Hypertension   .  Radicular pain of right lower extremity   . Stroke ([place])   . Suicide attempt [facility]) 2014   damaged larynx - tracheostomy  . Thyroid disease     Review of systems:  Otherwise negative.    Physical Exam  Gen: Alert, oriented. Appears stated age.  HEENT: [place]/AT. PERRLA. Lungs: CTA, no wheezes. CV: RR nl S1, S2. Abd: soft, benign, no masses. BS+ Ext: No edema. Pulses 2+    Planned procedures: Proceed with EGD and colonoscopy. The patient understands the nature of the planned procedure, indications, risks, alternatives and potential complications including but not limited to bleeding, infection, perforation, damage to internal organs  and possible oversedation/side effects from anesthesia. The patient agrees and gives consent to proceed.  Please refer to procedure notes for findings, recommendations and patient disposition/instructions.     Teodoro K. Alice Reichert, M.D. Gastroenterology 05/15/2018  8:42 AM

## 2018-05-16 ENCOUNTER — Encounter: Payer: Self-pay | Admitting: Internal Medicine

## 2018-05-16 LAB — SURGICAL PATHOLOGY

## 2018-05-17 DIAGNOSIS — Z9002 Acquired absence of larynx: Secondary | ICD-10-CM | POA: Diagnosis not present

## 2018-05-17 DIAGNOSIS — Z09 Encounter for follow-up examination after completed treatment for conditions other than malignant neoplasm: Secondary | ICD-10-CM | POA: Diagnosis not present

## 2018-05-17 DIAGNOSIS — R491 Aphonia: Secondary | ICD-10-CM | POA: Diagnosis not present

## 2018-05-17 DIAGNOSIS — J398 Other specified diseases of upper respiratory tract: Secondary | ICD-10-CM | POA: Diagnosis not present

## 2018-05-22 DIAGNOSIS — Z930 Tracheostomy status: Secondary | ICD-10-CM | POA: Diagnosis not present

## 2018-05-22 DIAGNOSIS — R220 Localized swelling, mass and lump, head: Secondary | ICD-10-CM | POA: Diagnosis not present

## 2018-05-22 DIAGNOSIS — I639 Cerebral infarction, unspecified: Secondary | ICD-10-CM | POA: Diagnosis not present

## 2018-05-22 DIAGNOSIS — J9620 Acute and chronic respiratory failure, unspecified whether with hypoxia or hypercapnia: Secondary | ICD-10-CM | POA: Diagnosis not present

## 2018-05-22 DIAGNOSIS — E1065 Type 1 diabetes mellitus with hyperglycemia: Secondary | ICD-10-CM | POA: Diagnosis not present

## 2018-05-23 DIAGNOSIS — E1065 Type 1 diabetes mellitus with hyperglycemia: Secondary | ICD-10-CM | POA: Diagnosis not present

## 2018-05-23 DIAGNOSIS — T85628A Displacement of other specified internal prosthetic devices, implants and grafts, initial encounter: Secondary | ICD-10-CM | POA: Diagnosis not present

## 2018-05-23 DIAGNOSIS — Z9002 Acquired absence of larynx: Secondary | ICD-10-CM | POA: Diagnosis not present

## 2018-05-23 DIAGNOSIS — J386 Stenosis of larynx: Secondary | ICD-10-CM | POA: Diagnosis not present

## 2018-05-23 DIAGNOSIS — R491 Aphonia: Secondary | ICD-10-CM | POA: Diagnosis not present

## 2018-05-29 DIAGNOSIS — Z430 Encounter for attention to tracheostomy: Secondary | ICD-10-CM | POA: Diagnosis not present

## 2018-05-29 DIAGNOSIS — Z930 Tracheostomy status: Secondary | ICD-10-CM | POA: Diagnosis not present

## 2018-06-05 DIAGNOSIS — Z930 Tracheostomy status: Secondary | ICD-10-CM | POA: Diagnosis not present

## 2018-06-05 DIAGNOSIS — Z430 Encounter for attention to tracheostomy: Secondary | ICD-10-CM | POA: Diagnosis not present

## 2018-06-13 DIAGNOSIS — E785 Hyperlipidemia, unspecified: Secondary | ICD-10-CM | POA: Diagnosis not present

## 2018-06-13 DIAGNOSIS — R809 Proteinuria, unspecified: Secondary | ICD-10-CM | POA: Diagnosis not present

## 2018-06-13 DIAGNOSIS — E1029 Type 1 diabetes mellitus with other diabetic kidney complication: Secondary | ICD-10-CM | POA: Diagnosis not present

## 2018-06-13 DIAGNOSIS — I10 Essential (primary) hypertension: Secondary | ICD-10-CM | POA: Diagnosis not present

## 2018-06-13 DIAGNOSIS — E1069 Type 1 diabetes mellitus with other specified complication: Secondary | ICD-10-CM | POA: Diagnosis not present

## 2018-06-13 DIAGNOSIS — E1159 Type 2 diabetes mellitus with other circulatory complications: Secondary | ICD-10-CM | POA: Diagnosis not present

## 2018-06-18 DIAGNOSIS — J955 Postprocedural subglottic stenosis: Secondary | ICD-10-CM | POA: Diagnosis not present

## 2018-06-18 DIAGNOSIS — Z09 Encounter for follow-up examination after completed treatment for conditions other than malignant neoplasm: Secondary | ICD-10-CM | POA: Diagnosis not present

## 2018-06-18 DIAGNOSIS — R491 Aphonia: Secondary | ICD-10-CM | POA: Diagnosis not present

## 2018-06-18 DIAGNOSIS — J9503 Malfunction of tracheostomy stoma: Secondary | ICD-10-CM | POA: Diagnosis not present

## 2018-06-18 DIAGNOSIS — Z9002 Acquired absence of larynx: Secondary | ICD-10-CM | POA: Diagnosis not present

## 2018-06-22 DIAGNOSIS — E1065 Type 1 diabetes mellitus with hyperglycemia: Secondary | ICD-10-CM | POA: Diagnosis not present

## 2018-06-22 DIAGNOSIS — Z930 Tracheostomy status: Secondary | ICD-10-CM | POA: Diagnosis not present

## 2018-06-22 DIAGNOSIS — I639 Cerebral infarction, unspecified: Secondary | ICD-10-CM | POA: Diagnosis not present

## 2018-06-22 DIAGNOSIS — R220 Localized swelling, mass and lump, head: Secondary | ICD-10-CM | POA: Diagnosis not present

## 2018-06-22 DIAGNOSIS — J9620 Acute and chronic respiratory failure, unspecified whether with hypoxia or hypercapnia: Secondary | ICD-10-CM | POA: Diagnosis not present

## 2018-06-28 DIAGNOSIS — Z930 Tracheostomy status: Secondary | ICD-10-CM | POA: Diagnosis not present

## 2018-06-28 DIAGNOSIS — Z430 Encounter for attention to tracheostomy: Secondary | ICD-10-CM | POA: Diagnosis not present

## 2018-07-11 DIAGNOSIS — E7800 Pure hypercholesterolemia, unspecified: Secondary | ICD-10-CM | POA: Diagnosis not present

## 2018-07-11 DIAGNOSIS — Z79899 Other long term (current) drug therapy: Secondary | ICD-10-CM | POA: Diagnosis not present

## 2018-07-11 DIAGNOSIS — E039 Hypothyroidism, unspecified: Secondary | ICD-10-CM | POA: Diagnosis not present

## 2018-07-11 DIAGNOSIS — E1065 Type 1 diabetes mellitus with hyperglycemia: Secondary | ICD-10-CM | POA: Diagnosis not present

## 2018-07-17 DIAGNOSIS — F3341 Major depressive disorder, recurrent, in partial remission: Secondary | ICD-10-CM | POA: Diagnosis not present

## 2018-07-17 DIAGNOSIS — F411 Generalized anxiety disorder: Secondary | ICD-10-CM | POA: Diagnosis not present

## 2018-07-17 DIAGNOSIS — F5101 Primary insomnia: Secondary | ICD-10-CM | POA: Diagnosis not present

## 2018-07-18 DIAGNOSIS — N183 Chronic kidney disease, stage 3 (moderate): Secondary | ICD-10-CM | POA: Diagnosis not present

## 2018-07-18 DIAGNOSIS — F3342 Major depressive disorder, recurrent, in full remission: Secondary | ICD-10-CM | POA: Diagnosis not present

## 2018-07-18 DIAGNOSIS — E039 Hypothyroidism, unspecified: Secondary | ICD-10-CM | POA: Diagnosis not present

## 2018-07-18 DIAGNOSIS — Z930 Tracheostomy status: Secondary | ICD-10-CM | POA: Diagnosis not present

## 2018-07-18 DIAGNOSIS — Z125 Encounter for screening for malignant neoplasm of prostate: Secondary | ICD-10-CM | POA: Diagnosis not present

## 2018-07-18 DIAGNOSIS — I10 Essential (primary) hypertension: Secondary | ICD-10-CM | POA: Diagnosis not present

## 2018-07-18 DIAGNOSIS — E7800 Pure hypercholesterolemia, unspecified: Secondary | ICD-10-CM | POA: Diagnosis not present

## 2018-07-18 DIAGNOSIS — Z79899 Other long term (current) drug therapy: Secondary | ICD-10-CM | POA: Diagnosis not present

## 2018-07-18 DIAGNOSIS — E1022 Type 1 diabetes mellitus with diabetic chronic kidney disease: Secondary | ICD-10-CM | POA: Diagnosis not present

## 2018-07-19 ENCOUNTER — Other Ambulatory Visit: Payer: Self-pay | Admitting: Pharmacist

## 2018-07-19 NOTE — Patient Outreach (Signed)
[facility]) Care Management  [place] - Medication Adherence   07/19/2018  [name] 09/24/1960 [id number]  [facility] Medication: simvastatin 20 mg Date & Supply of last refill: 05/14/2018, 90 day supply Current insurance:[employer]   Outreach:  Incoming call from Darrol Angel in response to the [facility] Medication Adherence Campaign. Speak with patient. HIPAA identifiers verified.  Subjective:  [name] reports that he is taking his simvastatin 20 mg daily as directed. Denies any recent missed doses or any barriers to adherence. Patient reports that earlier in the year, he was having greater difficulty with being adherent to his medications when his wife was hospitalized. Counsel patient on the importance of medication adherence. Patient reports using a weekly pillbox to organize his medications.   Patient denies any further medication questions/concerns at this time.    Objective: Lab Results  Component Value Date   CREATININE 1.74 (H) 09/11/2016   CREATININE 1.73 (H) 08/27/2016   CREATININE 1.26 02/20/2014    Lab Results  Component Value Date   HGBA1C 8.1 (H) 09/15/2016    Lipid Panel     Component Value Date/Time   CHOL 161 09/15/2016 0711   TRIG 190 (H) 09/15/2016 0711   HDL 41 09/15/2016 0711   CHOLHDL 3.9 09/15/2016 0711   VLDL 38 09/15/2016 0711   LDLCALC 82 09/15/2016 0711    BP Readings from Last 3 Encounters:  05/15/18 130/69  12/26/17 140/80  08/16/17 (!) 150/90    Allergies  Allergen Reactions  . Buspar [Buspirone]     Makes the patient "flip out"  . Depakote [Valproic Acid]     Causes excessive drowsiness  . Clopidogrel Rash  . Gabapentin Itching and Rash     Assessment:  . No current barriers identified   Plan:  Will close pharmacy episode.  Harlow Asa, PharmD, [place] [facility] [phone]

## 2018-07-29 DIAGNOSIS — Z430 Encounter for attention to tracheostomy: Secondary | ICD-10-CM | POA: Diagnosis not present

## 2018-07-29 DIAGNOSIS — Z930 Tracheostomy status: Secondary | ICD-10-CM | POA: Diagnosis not present

## 2018-08-22 DIAGNOSIS — R809 Proteinuria, unspecified: Secondary | ICD-10-CM | POA: Diagnosis not present

## 2018-08-22 DIAGNOSIS — E1029 Type 1 diabetes mellitus with other diabetic kidney complication: Secondary | ICD-10-CM | POA: Diagnosis not present

## 2018-09-09 DIAGNOSIS — Z430 Encounter for attention to tracheostomy: Secondary | ICD-10-CM | POA: Diagnosis not present

## 2018-09-09 DIAGNOSIS — Z930 Tracheostomy status: Secondary | ICD-10-CM | POA: Diagnosis not present

## 2018-09-11 DIAGNOSIS — Z9002 Acquired absence of larynx: Secondary | ICD-10-CM | POA: Diagnosis not present

## 2018-09-11 DIAGNOSIS — Z963 Presence of artificial larynx: Secondary | ICD-10-CM | POA: Diagnosis not present

## 2018-09-11 DIAGNOSIS — Z448 Encounter for fitting and adjustment of other external prosthetic devices: Secondary | ICD-10-CM | POA: Diagnosis not present

## 2018-09-19 DIAGNOSIS — E785 Hyperlipidemia, unspecified: Secondary | ICD-10-CM | POA: Diagnosis not present

## 2018-09-19 DIAGNOSIS — R809 Proteinuria, unspecified: Secondary | ICD-10-CM | POA: Diagnosis not present

## 2018-09-19 DIAGNOSIS — I10 Essential (primary) hypertension: Secondary | ICD-10-CM | POA: Diagnosis not present

## 2018-09-19 DIAGNOSIS — E1029 Type 1 diabetes mellitus with other diabetic kidney complication: Secondary | ICD-10-CM | POA: Diagnosis not present

## 2018-09-19 DIAGNOSIS — E1159 Type 2 diabetes mellitus with other circulatory complications: Secondary | ICD-10-CM | POA: Diagnosis not present

## 2018-09-19 DIAGNOSIS — E1069 Type 1 diabetes mellitus with other specified complication: Secondary | ICD-10-CM | POA: Diagnosis not present

## 2018-10-04 DIAGNOSIS — M6208 Separation of muscle (nontraumatic), other site: Secondary | ICD-10-CM | POA: Diagnosis not present

## 2018-10-05 DIAGNOSIS — Z9002 Acquired absence of larynx: Secondary | ICD-10-CM | POA: Diagnosis not present

## 2018-10-05 DIAGNOSIS — R0602 Shortness of breath: Secondary | ICD-10-CM | POA: Diagnosis not present

## 2018-10-05 DIAGNOSIS — E039 Hypothyroidism, unspecified: Secondary | ICD-10-CM | POA: Diagnosis not present

## 2018-10-05 DIAGNOSIS — I10 Essential (primary) hypertension: Secondary | ICD-10-CM | POA: Diagnosis not present

## 2018-10-05 DIAGNOSIS — E119 Type 2 diabetes mellitus without complications: Secondary | ICD-10-CM | POA: Diagnosis not present

## 2018-10-05 DIAGNOSIS — Z87891 Personal history of nicotine dependence: Secondary | ICD-10-CM | POA: Diagnosis not present

## 2018-10-05 DIAGNOSIS — Z86718 Personal history of other venous thrombosis and embolism: Secondary | ICD-10-CM | POA: Diagnosis not present

## 2018-10-05 DIAGNOSIS — Z9641 Presence of insulin pump (external) (internal): Secondary | ICD-10-CM | POA: Diagnosis not present

## 2018-10-05 DIAGNOSIS — F419 Anxiety disorder, unspecified: Secondary | ICD-10-CM | POA: Diagnosis not present

## 2018-10-05 DIAGNOSIS — Z79899 Other long term (current) drug therapy: Secondary | ICD-10-CM | POA: Diagnosis not present

## 2018-10-05 DIAGNOSIS — J9500 Unspecified tracheostomy complication: Secondary | ICD-10-CM | POA: Diagnosis not present

## 2018-10-05 DIAGNOSIS — J9509 Other tracheostomy complication: Secondary | ICD-10-CM | POA: Diagnosis not present

## 2018-10-05 DIAGNOSIS — Z794 Long term (current) use of insulin: Secondary | ICD-10-CM | POA: Diagnosis not present

## 2018-10-09 DIAGNOSIS — R809 Proteinuria, unspecified: Secondary | ICD-10-CM | POA: Diagnosis not present

## 2018-10-09 DIAGNOSIS — F5101 Primary insomnia: Secondary | ICD-10-CM | POA: Diagnosis not present

## 2018-10-09 DIAGNOSIS — E1029 Type 1 diabetes mellitus with other diabetic kidney complication: Secondary | ICD-10-CM | POA: Diagnosis not present

## 2018-10-09 DIAGNOSIS — F411 Generalized anxiety disorder: Secondary | ICD-10-CM | POA: Diagnosis not present

## 2018-10-09 DIAGNOSIS — F3341 Major depressive disorder, recurrent, in partial remission: Secondary | ICD-10-CM | POA: Diagnosis not present

## 2018-10-10 DIAGNOSIS — Z448 Encounter for fitting and adjustment of other external prosthetic devices: Secondary | ICD-10-CM | POA: Diagnosis not present

## 2018-10-18 DIAGNOSIS — E1029 Type 1 diabetes mellitus with other diabetic kidney complication: Secondary | ICD-10-CM | POA: Diagnosis not present

## 2018-10-18 DIAGNOSIS — R809 Proteinuria, unspecified: Secondary | ICD-10-CM | POA: Diagnosis not present

## 2018-10-22 DIAGNOSIS — Z430 Encounter for attention to tracheostomy: Secondary | ICD-10-CM | POA: Diagnosis not present

## 2018-10-22 DIAGNOSIS — Z930 Tracheostomy status: Secondary | ICD-10-CM | POA: Diagnosis not present

## 2018-10-28 DIAGNOSIS — Z930 Tracheostomy status: Secondary | ICD-10-CM | POA: Diagnosis not present

## 2018-10-28 DIAGNOSIS — Z430 Encounter for attention to tracheostomy: Secondary | ICD-10-CM | POA: Diagnosis not present

## 2018-10-29 DIAGNOSIS — R220 Localized swelling, mass and lump, head: Secondary | ICD-10-CM | POA: Diagnosis not present

## 2018-10-29 DIAGNOSIS — Z930 Tracheostomy status: Secondary | ICD-10-CM | POA: Diagnosis not present

## 2018-10-29 DIAGNOSIS — J9620 Acute and chronic respiratory failure, unspecified whether with hypoxia or hypercapnia: Secondary | ICD-10-CM | POA: Diagnosis not present

## 2018-10-29 DIAGNOSIS — I639 Cerebral infarction, unspecified: Secondary | ICD-10-CM | POA: Diagnosis not present

## 2018-10-30 DIAGNOSIS — Z7982 Long term (current) use of aspirin: Secondary | ICD-10-CM | POA: Diagnosis not present

## 2018-10-30 DIAGNOSIS — Z794 Long term (current) use of insulin: Secondary | ICD-10-CM | POA: Diagnosis not present

## 2018-10-30 DIAGNOSIS — Z86718 Personal history of other venous thrombosis and embolism: Secondary | ICD-10-CM | POA: Diagnosis not present

## 2018-10-30 DIAGNOSIS — E039 Hypothyroidism, unspecified: Secondary | ICD-10-CM | POA: Diagnosis not present

## 2018-10-30 DIAGNOSIS — J386 Stenosis of larynx: Secondary | ICD-10-CM | POA: Diagnosis not present

## 2018-10-30 DIAGNOSIS — I10 Essential (primary) hypertension: Secondary | ICD-10-CM | POA: Diagnosis not present

## 2018-10-30 DIAGNOSIS — E7800 Pure hypercholesterolemia, unspecified: Secondary | ICD-10-CM | POA: Diagnosis not present

## 2018-10-30 DIAGNOSIS — E119 Type 2 diabetes mellitus without complications: Secondary | ICD-10-CM | POA: Diagnosis not present

## 2018-10-30 DIAGNOSIS — Z79899 Other long term (current) drug therapy: Secondary | ICD-10-CM | POA: Diagnosis not present

## 2018-10-30 DIAGNOSIS — Z87891 Personal history of nicotine dependence: Secondary | ICD-10-CM | POA: Diagnosis not present

## 2018-11-01 DIAGNOSIS — Z930 Tracheostomy status: Secondary | ICD-10-CM | POA: Diagnosis not present

## 2018-11-01 DIAGNOSIS — J9620 Acute and chronic respiratory failure, unspecified whether with hypoxia or hypercapnia: Secondary | ICD-10-CM | POA: Diagnosis not present

## 2018-11-01 DIAGNOSIS — I639 Cerebral infarction, unspecified: Secondary | ICD-10-CM | POA: Diagnosis not present

## 2018-11-01 DIAGNOSIS — R220 Localized swelling, mass and lump, head: Secondary | ICD-10-CM | POA: Diagnosis not present

## 2018-11-05 DIAGNOSIS — F5101 Primary insomnia: Secondary | ICD-10-CM | POA: Diagnosis not present

## 2018-11-05 DIAGNOSIS — F3341 Major depressive disorder, recurrent, in partial remission: Secondary | ICD-10-CM | POA: Diagnosis not present

## 2018-11-05 DIAGNOSIS — F6089 Other specific personality disorders: Secondary | ICD-10-CM | POA: Diagnosis not present

## 2018-11-05 DIAGNOSIS — F411 Generalized anxiety disorder: Secondary | ICD-10-CM | POA: Diagnosis not present

## 2018-11-06 DIAGNOSIS — R809 Proteinuria, unspecified: Secondary | ICD-10-CM | POA: Diagnosis not present

## 2018-11-06 DIAGNOSIS — E1029 Type 1 diabetes mellitus with other diabetic kidney complication: Secondary | ICD-10-CM | POA: Diagnosis not present

## 2018-11-11 DIAGNOSIS — E1029 Type 1 diabetes mellitus with other diabetic kidney complication: Secondary | ICD-10-CM | POA: Diagnosis not present

## 2018-11-11 DIAGNOSIS — R809 Proteinuria, unspecified: Secondary | ICD-10-CM | POA: Diagnosis not present

## 2018-11-12 ENCOUNTER — Emergency Department: Payer: PPO

## 2018-11-12 ENCOUNTER — Encounter: Payer: Self-pay | Admitting: Emergency Medicine

## 2018-11-12 ENCOUNTER — Other Ambulatory Visit: Payer: Self-pay

## 2018-11-12 ENCOUNTER — Emergency Department
Admission: EM | Admit: 2018-11-12 | Discharge: 2018-11-12 | Disposition: A | Discharge status: Home / Self Care | Payer: PPO | Attending: Emergency Medicine | Admitting: Emergency Medicine

## 2018-11-12 DIAGNOSIS — Y999 Unspecified external cause status: Secondary | ICD-10-CM | POA: Insufficient documentation

## 2018-11-12 DIAGNOSIS — Y92002 Bathroom of unspecified non-institutional (private) residence single-family (private) house as the place of occurrence of the external cause: Secondary | ICD-10-CM | POA: Insufficient documentation

## 2018-11-12 DIAGNOSIS — Z9641 Presence of insulin pump (external) (internal): Secondary | ICD-10-CM | POA: Insufficient documentation

## 2018-11-12 DIAGNOSIS — Z79899 Other long term (current) drug therapy: Secondary | ICD-10-CM | POA: Diagnosis not present

## 2018-11-12 DIAGNOSIS — S99911A Unspecified injury of right ankle, initial encounter: Secondary | ICD-10-CM | POA: Diagnosis not present

## 2018-11-12 DIAGNOSIS — E1065 Type 1 diabetes mellitus with hyperglycemia: Secondary | ICD-10-CM | POA: Diagnosis not present

## 2018-11-12 DIAGNOSIS — Z87891 Personal history of nicotine dependence: Secondary | ICD-10-CM | POA: Insufficient documentation

## 2018-11-12 DIAGNOSIS — W01198A Fall on same level from slipping, tripping and stumbling with subsequent striking against other object, initial encounter: Secondary | ICD-10-CM | POA: Insufficient documentation

## 2018-11-12 DIAGNOSIS — E1165 Type 2 diabetes mellitus with hyperglycemia: Secondary | ICD-10-CM | POA: Diagnosis not present

## 2018-11-12 DIAGNOSIS — R55 Syncope and collapse: Secondary | ICD-10-CM | POA: Diagnosis not present

## 2018-11-12 DIAGNOSIS — I10 Essential (primary) hypertension: Secondary | ICD-10-CM | POA: Insufficient documentation

## 2018-11-12 DIAGNOSIS — S99921A Unspecified injury of right foot, initial encounter: Secondary | ICD-10-CM | POA: Diagnosis not present

## 2018-11-12 DIAGNOSIS — Z7982 Long term (current) use of aspirin: Secondary | ICD-10-CM | POA: Diagnosis not present

## 2018-11-12 DIAGNOSIS — Y9389 Activity, other specified: Secondary | ICD-10-CM | POA: Insufficient documentation

## 2018-11-12 DIAGNOSIS — R531 Weakness: Secondary | ICD-10-CM | POA: Diagnosis not present

## 2018-11-12 DIAGNOSIS — R739 Hyperglycemia, unspecified: Secondary | ICD-10-CM

## 2018-11-12 DIAGNOSIS — E860 Dehydration: Secondary | ICD-10-CM | POA: Diagnosis not present

## 2018-11-12 DIAGNOSIS — S82434A Nondisplaced oblique fracture of shaft of right fibula, initial encounter for closed fracture: Secondary | ICD-10-CM

## 2018-11-12 LAB — BASIC METABOLIC PANEL
Anion gap: 11 (ref 5–15)
BUN: 39 mg/dL — ABNORMAL HIGH (ref 6–20)
CO2: 20 mmol/L — ABNORMAL LOW (ref 22–32)
Calcium: 9.5 mg/dL (ref 8.9–10.3)
Chloride: 103 mmol/L (ref 98–111)
Creatinine, Ser: 1.83 mg/dL — ABNORMAL HIGH (ref 0.61–1.24)
GFR calc Af Amer: 47 mL/min — ABNORMAL LOW (ref >60)
GFR calc non Af Amer: 41 mL/min — ABNORMAL LOW (ref >60)
Glucose, Bld: 202 mg/dL — ABNORMAL HIGH (ref 70–99)
Potassium: 3.9 mmol/L (ref 3.5–5.1)
Sodium: 134 mmol/L — ABNORMAL LOW (ref 135–145)

## 2018-11-12 LAB — CBC
HCT: 50.3 % (ref 39.0–52.0)
Hemoglobin: 16.6 g/dL (ref 13.0–17.0)
MCH: 29 pg (ref 26.0–34.0)
MCHC: 33 g/dL (ref 30.0–36.0)
MCV: 87.8 fL (ref 80.0–100.0)
Platelets: 301 10*3/uL (ref 150–400)
RBC: 5.73 MIL/uL (ref 4.22–5.81)
RDW: 13.6 % (ref 11.5–15.5)
WBC: 10.2 10*3/uL (ref 4.0–10.5)
nRBC: 0 % (ref 0.0–0.2)

## 2018-11-12 LAB — URINALYSIS, COMPLETE (UACMP) WITH MICROSCOPIC
Bacteria, UA: NONE SEEN
Bilirubin Urine: NEGATIVE
Glucose, UA: >=500 mg/dL — AB
Hgb urine dipstick: NEGATIVE
Ketones, ur: NEGATIVE mg/dL
Leukocytes,Ua: NEGATIVE
Nitrite: NEGATIVE
Protein, ur: NEGATIVE mg/dL
Specific Gravity, Urine: 1.018 (ref 1.005–1.030)
Squamous Epithelial / HPF: NONE SEEN (ref 0–5)
pH: 5 (ref 5.0–8.0)

## 2018-11-12 MED ORDER — SODIUM CHLORIDE 0.9% FLUSH: 3.0000 mL | Freq: Once | INTRAVENOUS | Status: DC

## 2018-11-12 MED ORDER — HYDROCODONE-ACETAMINOPHEN 5-325 MG PO TABS
2.0000 | ORAL_TABLET | Freq: Once | ORAL | Status: AC
  Administered 2018-11-12: 2 via ORAL
  Filled 2018-11-12: qty 2

## 2018-11-12 MED ORDER — HYDROMORPHONE HCL 1 MG/ML IJ SOLN
1.0000 mg | Freq: Once | INTRAMUSCULAR | Status: AC
  Administered 2018-11-12: 1 mg via INTRAVENOUS
  Filled 2018-11-12: qty 1

## 2018-11-12 MED ORDER — ONDANSETRON HCL 4 MG/2ML IJ SOLN
4.0000 mg | Freq: Once | INTRAMUSCULAR | Status: AC
  Administered 2018-11-12: 4 mg via INTRAVENOUS
  Filled 2018-11-12: qty 2

## 2018-11-12 MED ORDER — SODIUM CHLORIDE 0.9 % IV BOLUS
1000.0000 mL | Freq: Once | INTRAVENOUS | Status: AC
  Administered 2018-11-12: 1000 mL via INTRAVENOUS

## 2018-11-12 MED ORDER — HYDROCODONE-ACETAMINOPHEN 5-325 MG PO TABS: 1.0000 | ORAL_TABLET | Freq: Four times a day (QID) | ORAL | 0 refills | Status: DC | PRN

## 2018-11-12 NOTE — ED Provider Notes (Signed)
[facility] Emergency Department Provider Note  ____________________________________________   First MD Initiated Contact with Patient 11/12/18 1155     (approximate)  I have reviewed the triage vital signs and the nursing notes.   HISTORY  Chief Complaint Loss of Consciousness    HPI [name] is a 58 y.o. male with past medical history as below here with generalized weakness.  The patient states that he recently switched to a new insulin pump.  Over the last week, his blood sugars have been elevated.  He has been very thirsty and peeing more than usual.  He is had some associated lightheadedness with standing.  He states that yesterday morning, he woke up, walked to the restroom to brush his teeth.  He states he may have felt mildly lightheaded, then syncopized.  He twisted his right ankle at the time.  Since then, he has had increasing pain with any attempted ambulation but is been able to walk, but has an aching, throbbing, severe pain.  He states that the swelling is worsened over the last 24 hours.  He has had some associated generalized weakness.  He states he just feels "tired."  Denies any fevers or chills.  No recent illnesses.  His insulin pump has been working as far as he thinks. No other complaints. He has been taking his Jardiance.    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   Thyroid disease     Patient Active Problem List   Diagnosis Date Noted   Type 1 diabetes mellitus ([place]) 09/14/2016   Major depressive disorder, recurrent severe without psychotic  features ([place]) 09/13/2016   Hypothyroidism 09/13/2016   HTN (hypertension) 09/13/2016   Dyslipidemia 09/13/2016   GERD (gastroesophageal reflux disease) 09/13/2016    Past Surgical History:  Procedure Laterality Date   COLONOSCOPY WITH PROPOFOL N/A 05/15/2018   Procedure: COLONOSCOPY WITH PROPOFOL;  Surgeon: [name], [name], MD;  Location: [facility] ENDOSCOPY;  Service: Gastroenterology;  Laterality: N/A;   ESOPHAGOGASTRODUODENOSCOPY N/A 05/15/2018   Procedure: ESOPHAGOGASTRODUODENOSCOPY (EGD);  Surgeon: [name], [name], MD;  Location: [facility] ENDOSCOPY;  Service: Gastroenterology;  Laterality: N/A;   FRACTURE SURGERY     Heel bone reconstruction Left    HERNIA REPAIR  01/3817   Umbilical hernia repair    LARYNGECTOMY     NECK SURGERY     fusion   [place]  2014   from SI attempt    Prior to Admission medications   Medication Sig Start Date End Date Taking? Authorizing Provider  ALPRAZolam Duanne Moron) 1 MG tablet Take 1 tablet (1 mg total) by mouth at bedtime as needed for sleep (for sleep). 12/26/17 12/26/18  Aundra Dubin, MD  amLODipine (NORVASC) 10 MG tablet  10/09/16   [provider]  ARIPiprazole (ABILIFY) 2 MG tablet Take 1 tablet (2 mg total) by mouth daily. 12/26/17   Aundra Dubin, MD  aspirin EC 81 MG tablet Take 1 tablet by mouth daily.    [provider]  empagliflozin (JARDIANCE) 25 MG TABS tablet Take 25 mg by mouth daily.    [provider]  Fexofenadine HCl (ALLEGRA PO) Take 180 mg by mouth daily.     [provider]  fluticasone (FLONASE) 50 MCG/ACT nasal spray Place 2 sprays into both nostrils daily.    [provider]  fluvoxaMINE (LUVOX) 100 MG tablet Take 2 tablets (200 mg total) by mouth at bedtime. 12/26/17   Eksir, Richard Miu, MD  HYDROcodone-acetaminophen (NORCO/VICODIN) 5-325 MG tablet Take 1-2 tablets by mouth every 6 (six) hours as needed for moderate pain or severe pain.  11/12/18 11/12/19  Duffy Bruce, MD  insulin aspart (NOVOLOG) 100 UNIT/ML injection Inject 1.15-1.25 Units into the skin See admin instructions. Via insulin pump-basal rate: 1.15-1.25 units/hr (every 8 carbs = 1 unit of insulin bolus) 0000-0800 = 1.15 units/hr and 0801/2359 = 1.25 units/hr 06/26/16   [provider]  levothyroxine (SYNTHROID, LEVOTHROID) 75 MCG tablet Take 75 mcg by mouth daily before breakfast.    [provider]  losartan-hydrochlorothiazide (HYZAAR) 100-25 MG tablet  01/02/17   [provider]  meloxicam (MOBIC) 15 MG tablet Take 15 mg by mouth as needed for pain.    [provider]  Multiple Vitamin (MULTIVITAMIN) tablet Take 1 tablet by mouth daily.    [provider]  pantoprazole (PROTONIX) 40 MG tablet Take 1 tablet by mouth 2 (two) times daily. Taking one tablet daily 05/09/16   [provider]  simvastatin (ZOCOR) 20 MG tablet Take 1 tablet by mouth every evening.     [provider]  tobramycin-dexamethasone [facility]) ophthalmic ointment 1 application 3 (three) times daily.    [provider]  vardenafil (LEVITRA) 20 MG tablet Take 20 mg by mouth daily as needed for erectile dysfunction.    [provider]    Allergies Buspar [buspirone], Depakote [valproic acid], Clopidogrel, and Gabapentin  Family History  Problem Relation Age of Onset   Osteoporosis Mother    Diabetes Mother    Hypertension Father     Social History Social History   Tobacco Use   Smoking status: Former Smoker    Packs/day: 0.00    Types: Cigarettes    Quit date: 04/09/2013    Years since quitting: 5.5   Smokeless tobacco: Never Used  Substance Use Topics   Alcohol use: Yes    Alcohol/week: 2.0 standard drinks    Types: 2 Shots of liquor per week    Comment: Pt denied; BAC not available   Drug use: No    Comment: Pt denied; UDS not available    Review of Systems  Review of Systems    Constitutional: Positive for fatigue. Negative for chills and fever.  HENT: Negative for sore throat.   Respiratory: Negative for shortness of breath.   Cardiovascular: Negative for chest pain.  Gastrointestinal: Negative for abdominal pain.  Genitourinary: Negative for flank pain.  Musculoskeletal: Negative for neck pain.  Skin: Negative for rash and wound.  Allergic/Immunologic: Negative for immunocompromised state.  Neurological: Positive for weakness. Negative for numbness.  Hematological: Does not bruise/bleed easily.  All other systems reviewed and are negative.    ____________________________________________  PHYSICAL EXAM:      VITAL SIGNS: ED Triage Vitals  Enc Vitals Group     BP 11/12/18 1113 (!) 142/74     Pulse --      Resp 11/12/18 1113 (!) 22     Temp 11/12/18 1113 98.8 F (37.1 C)  Temp Source 11/12/18 1113 Oral     SpO2 11/12/18 1113 97 %     Weight 11/12/18 1105 255 lb 1.2 oz (115.7 kg)     Height --      Head Circumference --      Peak Flow --      Pain Score 11/12/18 1104 7     Pain Loc --      Pain Edu? --      Excl. in [place]? --      Physical Exam    ____________________________________________   LABS (all labs ordered are listed, but only abnormal results are displayed)  Labs Reviewed  BASIC METABOLIC PANEL - Abnormal; Notable for the following components:      Result Value   Sodium 134 (*)    CO2 20 (*)    Glucose, Bld 202 (*)    BUN 39 (*)    Creatinine, Ser 1.83 (*)    GFR calc non Af Amer 41 (*)    GFR calc Af Amer 47 (*)    All other components within normal limits  URINALYSIS, COMPLETE (UACMP) WITH MICROSCOPIC - Abnormal; Notable for the following components:   Color, Urine STRAW (*)    APPearance CLEAR (*)    Glucose, UA >=500 (*)    All other components within normal limits  CBC  CBG MONITORING, ED    ____________________________________________  EKG: Normal sinus rhythm, VR 71. PR 128, QRS 78, QTc 417. No acute  ischemic changes. No significant change from prior. ________________________________________  RADIOLOGY All imaging, including plain films, CT scans, and ultrasounds, independently reviewed by me, and interpretations confirmed via formal radiology reads.  ED MD interpretation:   Plain films: Chest x-ray negative.  Plain films of the ankle show nondisplaced distal fibula fracture.  The ankle mortise is intact.   Official radiology report(s): Dg Chest 2 View  Result Date: 11/12/2018 CLINICAL DATA:  Syncopal episode. Weakness. EXAM: CHEST - 2 VIEW COMPARISON:  08/27/2016 FINDINGS: Mildly enlarged cardiac silhouette. Mediastinal contours appear intact. There is no evidence of focal airspace consolidation, pleural effusion or pneumothorax. Osseous structures are without acute abnormality. Soft tissues are grossly normal. IMPRESSION: 1. No active cardiopulmonary disease. 2. Mildly enlarged cardiac silhouette. Electronically Signed   By: Fidela Salisbury M.D.   On: 11/12/2018 12:39   Dg Ankle 2 Views Right  Addendum Date: 11/12/2018   ADDENDUM REPORT: 11/12/2018 13:27 ADDENDUM: Oblique/mortise view of the ankle confirms suspicious findings for an oblique nondisplaced distal fibular shaft fracture. Electronically Signed   By: Marijo Sanes M.D.   On: 11/12/2018 13:27   Result Date: 11/12/2018 CLINICAL DATA:  [place] yesterday and injured right foot and ankle. EXAM: RIGHT ANKLE - 2 VIEW COMPARISON:  None. FINDINGS: Findings suspicious for a nondisplaced oblique coursing distal fibular fracture. Oblique/mortise view may be helpful for further evaluation. No fracture of the tibia or talus is identified. The mid and hindfoot bony structures are intact. Age advanced vascular calcifications. IMPRESSION: Suspect nondisplaced distal fibular fracture. Recommend standard mortise/oblique film. Electronically Signed: By: Marijo Sanes M.D. On: 11/12/2018 12:29     ____________________________________________  PROCEDURES   Procedure(s) performed (including Critical Care):  Procedures  ____________________________________________  INITIAL IMPRESSION / MDM / [place] / ED COURSE  As part of my medical decision making, I reviewed the following data within the electronic MEDICAL RECORD NUMBER Notes from prior ED visits and [place] Controlled Substance Database      *[name] was evaluated in  Emergency Department on 11/12/2018 for the symptoms described in the history of present illness. He was evaluated in the context of the global COVID-19 pandemic, which necessitated consideration that the patient might be at risk for infection with the SARS-CoV-2 virus that causes COVID-19. Institutional protocols and algorithms that pertain to the evaluation of patients at risk for COVID-19 are in a state of rapid change based on information released by regulatory bodies including the CDC and federal and state organizations. These policies and algorithms were followed during the patient's care in the ED.  Some ED evaluations and interventions may be delayed as a result of limited staffing during the pandemic.*   Clinical Course as of Nov 12 1546  Tue Nov 12, 1046  9727 58 year old male with past medical history as above here with loss of consciousness.  I suspect he likely has a component of significant orthostasis secondary to hyperglycemia and dehydration.  Lab work shows elevated BUN to creatinine ratio compared to his baseline with mild AKI.  He is not anemic, has no arrhythmia on EKG or telemetry, and I have a low concern for arrhythmia or ACS.  Will give fluids.  Otherwise, will obtain plain films of his ankle.   [CI]  1414 Plain films reviewed.  CBC without leukocytosis or anemia.  BMP shows mild dehydration with elevated BUN to creatinine ratio as mentioned.  He feels markedly improved with IV fluids.  Anion gap is normal, bicarb is only 20 which  is his baseline, and I see no evidence to suggest DKA currently.  He is been given 2 L of fluid.  Denies any abdominal pain.  Will place in cam walker for his distal fibula with outpatient referral to his orthopedist, crutches and weightbearing as tolerated.  Analgesics given.  Regarding his syncope, I suspect this is multifactorial secondary to dehydration and orthostasis in the setting of his hyperglycemia.  He will call his endocrinologist regarding his insulin pump management.   [CI]  1544 Urinalysis without signs of ketones.  Patient has been provided with a cam walker.  Will discharge with outpatient follow-up.   [CI]    Clinical Course User Index [CI] Duffy Bruce, MD    Medical Decision Making: As above.  ____________________________________________  FINAL CLINICAL IMPRESSION(S) / ED DIAGNOSES  Final diagnoses:  Dehydration  Hyperglycemia  Closed nondisplaced oblique fracture of shaft of right fibula, initial encounter     MEDICATIONS GIVEN DURING THIS VISIT:  Medications  sodium chloride flush (NS) 0.9 % injection 3 mL (3 mLs Intravenous Not Given 11/12/18 1204)  sodium chloride 0.9 % bolus 1,000 mL (1,000 mLs Intravenous New Bag/Given 11/12/18 1237)  sodium chloride 0.9 % bolus 1,000 mL (1,000 mLs Intravenous New Bag/Given 11/12/18 1238)  HYDROmorphone (DILAUDID) injection 1 mg (1 mg Intravenous Given 11/12/18 1328)  ondansetron (ZOFRAN) injection 4 mg (4 mg Intravenous Given 11/12/18 1328)  HYDROcodone-acetaminophen (NORCO/VICODIN) 5-325 MG per tablet 2 tablet (2 tablets Oral Given 11/12/18 1454)     ED Discharge Orders         Ordered    HYDROcodone-acetaminophen (NORCO/VICODIN) 5-325 MG tablet  Every 6 hours PRN     11/12/18 1546           Note:  This document was prepared using Dragon voice recognition software and may include unintentional dictation errors.   Duffy Bruce, MD 11/12/18 [phone]

## 2018-11-12 NOTE — Discharge Instructions (Addendum)
Use your crutches for at least the next week, then as needed.  As your pain improves, you can begin to apply a small amount of weight to the foot.  Take the pain medication as prescribed.  Try to elevate the ankle above the level of your heart when resting.  For your passing out, be very careful when going from sitting to standing.  Try to take at least 5 minutes adjusting to standing before moving anywhere.  Drink plenty of fluids.  Call your doctor to discuss your high blood sugars.

## 2018-11-12 NOTE — ED Notes (Signed)
Pt given urinal for urine sample at this time.

## 2018-11-12 NOTE — ED Notes (Signed)
Pt waiting on ortho boot.

## 2018-11-12 NOTE — ED Notes (Signed)
Spoke with pt's mom and updated her on plan of care and tests already completed at this time.

## 2018-11-12 NOTE — ED Notes (Signed)
Resumed care from samantha rn.  Pt alert.  ua to lab.  Iv fluids infusing.  Pt waiting on ortho boot for right foot.

## 2018-11-12 NOTE — ED Notes (Signed)
Pt unable to perform orthostatic vitals due to pt not able to stand

## 2018-11-12 NOTE — ED Notes (Signed)
Called and spoke with Zigmund Bunny from [employer] and they are sending someone out to fit pt for cam walker. RN Amy aware and MD Ellender Hose

## 2018-11-12 NOTE — ED Notes (Signed)
Cam walker boot applied by ortho tech from [facility].

## 2018-11-12 NOTE — ED Notes (Signed)
Juliann Pulse paging Ortho tech at this time

## 2018-11-12 NOTE — ED Triage Notes (Signed)
Arrives from [facility] for ED evaluation.  Patient fell yesterday, injuring right foot, but patient states he blacked out prior to fall.  Also c/o blood sugar has been elevated.

## 2018-11-14 DIAGNOSIS — E1029 Type 1 diabetes mellitus with other diabetic kidney complication: Secondary | ICD-10-CM | POA: Diagnosis not present

## 2018-11-14 DIAGNOSIS — R809 Proteinuria, unspecified: Secondary | ICD-10-CM | POA: Diagnosis not present

## 2018-11-15 ENCOUNTER — Telehealth: Payer: Self-pay

## 2018-11-15 NOTE — Care Management (Signed)
Late note entry 11/15/18 at 1444: call received from [facility] stating that Dr. Jonny Ruiz will write home health PT order for this patient. Message left for patient mom that [facility] home health can accept and that patient is in- network.

## 2018-11-15 NOTE — Telephone Encounter (Signed)
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  He has follow up appointment on 11/21/18 with Dr. Earnestine Leys and per Estill Bamberg with [facility], they do not have a sooner appointment and cannot schedule for outpatient PT with [facility] until they see him.  I've explained to evelyn that it may be better to prioritize this patient due to non-compliance with fracture and weight bearing status, safety with crutches, and diabetes.  Estill Bamberg will pass message to Dr. Sabra Heck per my request. Message also left for [facility] manager Tiffany Rice.

## 2018-11-17 DIAGNOSIS — R809 Proteinuria, unspecified: Secondary | ICD-10-CM | POA: Diagnosis not present

## 2018-11-17 DIAGNOSIS — E1029 Type 1 diabetes mellitus with other diabetic kidney complication: Secondary | ICD-10-CM | POA: Diagnosis not present

## 2018-11-18 DIAGNOSIS — S8261XA Displaced fracture of lateral malleolus of right fibula, initial encounter for closed fracture: Secondary | ICD-10-CM | POA: Insufficient documentation

## 2018-11-18 DIAGNOSIS — S8264XA Nondisplaced fracture of lateral malleolus of right fibula, initial encounter for closed fracture: Secondary | ICD-10-CM | POA: Diagnosis not present

## 2018-11-21 DIAGNOSIS — R220 Localized swelling, mass and lump, head: Secondary | ICD-10-CM | POA: Diagnosis not present

## 2018-11-21 DIAGNOSIS — Z930 Tracheostomy status: Secondary | ICD-10-CM | POA: Diagnosis not present

## 2018-11-21 DIAGNOSIS — J9620 Acute and chronic respiratory failure, unspecified whether with hypoxia or hypercapnia: Secondary | ICD-10-CM | POA: Diagnosis not present

## 2018-11-21 DIAGNOSIS — I639 Cerebral infarction, unspecified: Secondary | ICD-10-CM | POA: Diagnosis not present

## 2018-11-25 DIAGNOSIS — E1029 Type 1 diabetes mellitus with other diabetic kidney complication: Secondary | ICD-10-CM | POA: Diagnosis not present

## 2018-11-25 DIAGNOSIS — Z930 Tracheostomy status: Secondary | ICD-10-CM | POA: Diagnosis not present

## 2018-11-25 DIAGNOSIS — R809 Proteinuria, unspecified: Secondary | ICD-10-CM | POA: Diagnosis not present

## 2018-11-25 DIAGNOSIS — Z430 Encounter for attention to tracheostomy: Secondary | ICD-10-CM | POA: Diagnosis not present

## 2018-11-28 ENCOUNTER — Other Ambulatory Visit: Payer: Self-pay | Admitting: Pharmacist

## 2018-11-28 DIAGNOSIS — F3341 Major depressive disorder, recurrent, in partial remission: Secondary | ICD-10-CM | POA: Diagnosis not present

## 2018-11-28 DIAGNOSIS — F5101 Primary insomnia: Secondary | ICD-10-CM | POA: Diagnosis not present

## 2018-11-28 DIAGNOSIS — F411 Generalized anxiety disorder: Secondary | ICD-10-CM | POA: Diagnosis not present

## 2018-11-28 DIAGNOSIS — F6089 Other specific personality disorders: Secondary | ICD-10-CM | POA: Diagnosis not present

## 2018-11-28 NOTE — Patient Outreach (Signed)
[facility] [place] [facility]) Care Management  [place]   11/28/2018  Trimaine Maser [birth year] [id number]  Reason for referral: Medication Assistance with Jardiance  Referral source: [employer] Current insurance: [employer]   Outreach:  Unsuccessful telephone call attempt #1 to patient's mother, [name]. HIPAA compliant voicemail left requesting a return call  Plan:  -I will mail patient an unsuccessful outreach letter.  -I will make another outreach attempt to patient within 3-4 business days.   Ralene Bathe, PharmD, [place] [phone]

## 2018-11-29 ENCOUNTER — Other Ambulatory Visit: Payer: Self-pay | Admitting: Pharmacist

## 2018-11-29 DIAGNOSIS — F332 Major depressive disorder, recurrent severe without psychotic features: Secondary | ICD-10-CM | POA: Diagnosis not present

## 2018-11-29 DIAGNOSIS — I10 Essential (primary) hypertension: Secondary | ICD-10-CM | POA: Diagnosis not present

## 2018-11-29 DIAGNOSIS — E039 Hypothyroidism, unspecified: Secondary | ICD-10-CM | POA: Diagnosis not present

## 2018-11-29 DIAGNOSIS — Z9641 Presence of insulin pump (external) (internal): Secondary | ICD-10-CM | POA: Diagnosis not present

## 2018-11-29 DIAGNOSIS — S8264XD Nondisplaced fracture of lateral malleolus of right fibula, subsequent encounter for closed fracture with routine healing: Secondary | ICD-10-CM | POA: Diagnosis not present

## 2018-11-29 DIAGNOSIS — S82434D Nondisplaced oblique fracture of shaft of right fibula, subsequent encounter for closed fracture with routine healing: Secondary | ICD-10-CM | POA: Diagnosis not present

## 2018-11-29 DIAGNOSIS — M1712 Unilateral primary osteoarthritis, left knee: Secondary | ICD-10-CM | POA: Diagnosis not present

## 2018-11-29 DIAGNOSIS — Z7982 Long term (current) use of aspirin: Secondary | ICD-10-CM | POA: Diagnosis not present

## 2018-11-29 DIAGNOSIS — F419 Anxiety disorder, unspecified: Secondary | ICD-10-CM | POA: Diagnosis not present

## 2018-11-29 DIAGNOSIS — Z930 Tracheostomy status: Secondary | ICD-10-CM | POA: Diagnosis not present

## 2018-11-29 DIAGNOSIS — E785 Hyperlipidemia, unspecified: Secondary | ICD-10-CM | POA: Diagnosis not present

## 2018-11-29 DIAGNOSIS — K219 Gastro-esophageal reflux disease without esophagitis: Secondary | ICD-10-CM | POA: Diagnosis not present

## 2018-11-29 DIAGNOSIS — Z8673 Personal history of transient ischemic attack (TIA), and cerebral infarction without residual deficits: Secondary | ICD-10-CM | POA: Diagnosis not present

## 2018-11-29 DIAGNOSIS — Z87891 Personal history of nicotine dependence: Secondary | ICD-10-CM | POA: Diagnosis not present

## 2018-11-29 DIAGNOSIS — Z9181 History of falling: Secondary | ICD-10-CM | POA: Diagnosis not present

## 2018-11-29 DIAGNOSIS — E109 Type 1 diabetes mellitus without complications: Secondary | ICD-10-CM | POA: Diagnosis not present

## 2018-11-29 NOTE — Patient Outreach (Signed)
[name]) Care Management  [place]  11/29/2018  [name] 1960-07-23 [id number]   Reason for referral: Medication Assistance with Jardiance  Referral source: [facility] Current insurance: [employer]  Incoming call and voicemail from patient's mother, [name].   Outreach:  Unsuccessful telephone call attempt #2 to patient's mother.   HIPAA compliant voicemail left requesting a return call  Plan:  -I will make another outreach attempt to patient within 3-4 business days.    Ralene Bathe, PharmD, [place] [phone]

## 2018-12-02 ENCOUNTER — Other Ambulatory Visit: Payer: Self-pay | Admitting: Pharmacist

## 2018-12-02 DIAGNOSIS — S8264XA Nondisplaced fracture of lateral malleolus of right fibula, initial encounter for closed fracture: Secondary | ICD-10-CM | POA: Diagnosis not present

## 2018-12-02 NOTE — Patient Outreach (Signed)
[facility] [facility]) Care Management  [place]  12/02/2018  [name] 04-20-1961 [id number]   Reason for referral: Medication Assistance with Jardiance  Referral source: [facility] Current insurance: [employer]  Outreach:  Unsuccessful telephone call attempt #3 to patient.   HIPAA compliant voicemail left requesting a return call  Plan:  -I will make another outreach attempt to patient within 3-4 business days.    Ralene Bathe, PharmD, [name] [phone]

## 2018-12-03 ENCOUNTER — Other Ambulatory Visit: Payer: Self-pay | Admitting: Pharmacist

## 2018-12-03 DIAGNOSIS — E039 Hypothyroidism, unspecified: Secondary | ICD-10-CM | POA: Diagnosis not present

## 2018-12-03 DIAGNOSIS — E7800 Pure hypercholesterolemia, unspecified: Secondary | ICD-10-CM | POA: Diagnosis not present

## 2018-12-03 DIAGNOSIS — I10 Essential (primary) hypertension: Secondary | ICD-10-CM | POA: Diagnosis not present

## 2018-12-03 DIAGNOSIS — Z794 Long term (current) use of insulin: Secondary | ICD-10-CM | POA: Diagnosis not present

## 2018-12-03 DIAGNOSIS — R0602 Shortness of breath: Secondary | ICD-10-CM | POA: Diagnosis not present

## 2018-12-03 DIAGNOSIS — Z86718 Personal history of other venous thrombosis and embolism: Secondary | ICD-10-CM | POA: Diagnosis not present

## 2018-12-03 DIAGNOSIS — Z6834 Body mass index (BMI) 34.0-34.9, adult: Secondary | ICD-10-CM | POA: Diagnosis not present

## 2018-12-03 DIAGNOSIS — Z9002 Acquired absence of larynx: Secondary | ICD-10-CM | POA: Diagnosis not present

## 2018-12-03 DIAGNOSIS — R491 Aphonia: Secondary | ICD-10-CM | POA: Diagnosis not present

## 2018-12-03 DIAGNOSIS — J398 Other specified diseases of upper respiratory tract: Secondary | ICD-10-CM | POA: Diagnosis not present

## 2018-12-03 DIAGNOSIS — Z87891 Personal history of nicotine dependence: Secondary | ICD-10-CM | POA: Diagnosis not present

## 2018-12-03 DIAGNOSIS — Z7982 Long term (current) use of aspirin: Secondary | ICD-10-CM | POA: Diagnosis not present

## 2018-12-03 DIAGNOSIS — E119 Type 2 diabetes mellitus without complications: Secondary | ICD-10-CM | POA: Diagnosis not present

## 2018-12-03 DIAGNOSIS — F329 Major depressive disorder, single episode, unspecified: Secondary | ICD-10-CM | POA: Diagnosis not present

## 2018-12-03 DIAGNOSIS — Z79899 Other long term (current) drug therapy: Secondary | ICD-10-CM | POA: Diagnosis not present

## 2018-12-03 NOTE — Patient Outreach (Signed)
[facility]) Care Management  [place]   12/03/2018  Filemon Breton [birth year] [id number]  Reason for referral: Medication Assistance  Referral source: [place] Current insurance: [employer]  PMHx includes but not limited to:  T1DM, depression, hx SI, hx laryngectomy, HTN, HLD, hx stroke, thyroid disease, GERD  Outreach:  Successful telephone call with patient's mother, [name].  HIPAA identifiers verified. Mother reports patient has used [facility] in the past for patient assistance programs.  She is agreeable to use [facility] this year for assistance with applications. She states patient is now in the coverage gap and co-pays are very expensive for Jardiance, Novolog (vials for insulin pump), and new medication Rexulti.  She is not able to confirm all of patient's medications and requests that I contact patient to accurately update medication list in EMR. Voicemail left with patient requesting a call back.  Patient called me back and able to confirm medications.    Objective: The ASCVD Risk score Mikey Bussing DC Jr., et al., 2013) failed to calculate for the following reasons:   The patient has a prior MI or stroke diagnosis  Lab Results  Component Value Date   CREATININE 1.83 (H) 11/12/2018   CREATININE 1.74 (H) 09/11/2016   CREATININE 1.73 (H) 08/27/2016    Lab Results  Component Value Date   HGBA1C 8.1 (H) 09/15/2016    Lipid Panel     Component Value Date/Time   CHOL 161 09/15/2016 0711   TRIG 190 (H) 09/15/2016 0711   HDL 41 09/15/2016 0711   CHOLHDL 3.9 09/15/2016 0711   VLDL 38 09/15/2016 0711   LDLCALC 82 09/15/2016 0711    BP Readings from Last 3 Encounters:  11/12/18 (!) 153/91  05/15/18 130/69  12/26/17 140/80    Allergies  Allergen Reactions  . Buspar [Buspirone]     Makes the patient "flip out"  . Depakote [Valproic Acid]     Causes excessive drowsiness  . Clopidogrel Rash  . Gabapentin Itching and Rash     Medications Reviewed Today    Reviewed by Herminio Commons, RN (Registered Nurse) on 05/15/18 at [phone]  Med List Status: <None>  Medication Order Taking? Sig Documenting Provider Last Dose Status Informant  ALPRAZolam (XANAX) 1 MG tablet [id number] Yes Take 1 tablet (1 mg total) by mouth at bedtime as needed for sleep (for sleep). Aundra Dubin, MD 05/14/2018 2200 Active   amLODipine (NORVASC) 10 MG tablet [id number] Yes  [provider] 05/15/2018 0600 Active   ARIPiprazole (ABILIFY) 2 MG tablet [id number] Yes Take 1 tablet (2 mg total) by mouth daily. Aundra Dubin, MD 05/15/2018 0600 Active   aspirin EC 81 MG tablet [id number] Yes Take 1 tablet by mouth daily. [provider] 05/14/2018 0600 Active Self  empagliflozin (JARDIANCE) 25 MG TABS tablet [id number] Yes Take 25 mg by mouth daily. [provider] 05/15/2018 0600 Active   Fexofenadine HCl (ALLEGRA PO) [id number] Yes Take 180 mg by mouth daily.  [provider] Past Week Unknown time Active Self  fluticasone (FLONASE) 50 MCG/ACT nasal spray [id number] Yes Place 2 sprays into both nostrils daily. [provider] Past Week Unknown time Active Self  fluvoxaMINE (LUVOX) 100 MG tablet [id number] Yes Take 2 tablets (200 mg total) by mouth at bedtime. Aundra Dubin, MD 05/14/2018 2200 Active   insulin aspart (NOVOLOG) 100 UNIT/ML injection [id number] Yes Inject 1.15-1.25 Units into the skin See admin instructions. Via insulin pump-basal rate: 1.15-1.25 units/hr (every 8  carbs = 1 unit of insulin bolus) 0000-0800 = 1.15 units/hr and 0801/2359 = 1.25 units/hr [provider] 05/15/2018 Unknown time Active Self  levothyroxine (SYNTHROID, LEVOTHROID) 75 MCG tablet [id number] Yes Take 75 mcg by mouth daily before breakfast. [provider] 05/15/2018 0600 Active Self  losartan-hydrochlorothiazide (HYZAAR) 100-25 MG tablet [id number] Yes  [provider] 05/15/2018 0600 Active    meloxicam (MOBIC) 15 MG tablet [id number] Yes Take 15 mg by mouth as needed for pain. [provider] 05/15/2018 0600 Active   Multiple Vitamin (MULTIVITAMIN) tablet [id number] Yes Take 1 tablet by mouth daily. [provider] Past Week Unknown time Active   pantoprazole (PROTONIX) 40 MG tablet [id number] Yes Take 1 tablet by mouth 2 (two) times daily. Taking one tablet daily [provider] 05/15/2018 0600 Active Self  simvastatin (ZOCOR) 20 MG tablet [id number] Yes Take 1 tablet by mouth every evening.  [provider] 05/14/2018 2200 Active Self  tobramycin-dexamethasone (TOBRADEX) ophthalmic ointment [id number] Yes 1 application 3 (three) times daily. [provider] 05/14/2018 Unknown time Active   vardenafil (LEVITRA) 20 MG tablet [id number] Yes Take 20 mg by mouth daily as needed for erectile dysfunction. [provider] 05/14/2018 Unknown time Active           Assessment: Drugs sorted by system:  Neurologic/Psychologic: brexipiprazole, fluvoxamine, trazodone  Hematologic: aspirin 9m  Cardiovascular:  Amlodipine, losartan-HCTZ, simvastatin  Pulmonary/Allergy: fluticasone NS  Gastrointestinal: pantoprazole  Endocrine: empagliflozin, Novolog, levothyroxine  Vitamins/Minerals/Supplements: MVI  Miscellaneous: vardenafil  Medication Review Findings:  . No medication concerns at this time  Medication Assistance Findings:  Medication assistance needs identified: Jardiance, Novolog, Rexulti  Extra Help:  May be eligible for Full or Partial Extra Help Low Income Subsidy based on reported income and assets  Patient Assistance Programs: [place] by [place]ment met: Yes o Out-of-pocket prescription expenditure met:   Not Applicable - Patient has met application requirements to apply for this program.  - Reviewed program requirements with patient.    Novolog made by [place]requirement met:  Yes o Out-of-pocket prescription expenditure met:   Not Applicable - Patient has met application requirements to apply for this program.  - Reviewed program requirements with patient.    Jardiance made by [place]requirement met: Yes o Out-of-pocket prescription expenditure met:   Not Applicable Patient has met application requirements to apply for this program.  Reviewed program requirements with patient.   -Reviewed with patient and mother that Extra Help decision letter will need to be included with this application.  They voiced understanding.  -Mother reports she can drive applications and necessary documents [facility] complete.  She will call me 1-2 days before she is ready to drop them off.     Plan: . Assisted patient with applying for Extra Help.  . I will route patient assistance letter to TMar-Mactechnician who will coordinate patient assistance program application process for medications listed above.  [facility] Centerpharmacy [facility] will assist with obtaining all required documents from both patient and provider(s) and submit application(s) once completed.  . Will follow-up in 4 weeks regarding Extra Help LIS decision.  CRalene Bathe PharmD, BSouth [phone]

## 2018-12-04 ENCOUNTER — Other Ambulatory Visit: Payer: Self-pay | Admitting: Pharmacy Technician

## 2018-12-04 DIAGNOSIS — E1029 Type 1 diabetes mellitus with other diabetic kidney complication: Secondary | ICD-10-CM | POA: Diagnosis not present

## 2018-12-04 DIAGNOSIS — R809 Proteinuria, unspecified: Secondary | ICD-10-CM | POA: Diagnosis not present

## 2018-12-04 NOTE — Patient Outreach (Signed)
[facility]) Care Management  12/04/2018  [name] 11-13-1960 [id number]                          Medication Assistance Referral  Referral From: [place]  Medication/Company: Vania Rea / B-I Patient application portion:  Mailed Provider application portion: Faxed  to Dr. Honor Junes  Medication/Company: Cira Servant / Novo Nordisk Patient application portion:  Mailed Provider application portion: Faxed  to Dr. Honor Junes  Medication/Company: Rexulti / Elson Clan Patient application portion:  Mailed Provider application portion: Faxed  to Dr. Merri Ray   Follow up:  Will follow up with patient in 7-10 business days to confirm application(s) have been received.  [name] [name] [place] Certified Pharmacy Technician [place] Management Direct Dial:[phone]

## 2018-12-05 ENCOUNTER — Ambulatory Visit: Payer: PPO | Admitting: Pharmacist

## 2018-12-16 ENCOUNTER — Other Ambulatory Visit: Payer: Self-pay | Admitting: Pharmacy Technician

## 2018-12-16 NOTE — Patient Outreach (Signed)
[facility]) Care Management  12/16/2018  [name] 1961/05/01 [id number]   Received patient portion(s) of patient assistance application for Rexulti, Jardiance and Novolog. Faxed completed application and required documents into [place] for [place].  Patient has not received LIS decision letter, will fax Jardiance (B-I) and Novolog [employer]) applications into companies once received.  Will follow up with company in 7-10 business days to check status of application.  [name] [name] [name] Certified Pharmacy Technician [place] Management Direct [phone]

## 2018-12-17 DIAGNOSIS — R0600 Dyspnea, unspecified: Secondary | ICD-10-CM | POA: Diagnosis not present

## 2018-12-17 DIAGNOSIS — R05 Cough: Secondary | ICD-10-CM | POA: Diagnosis not present

## 2018-12-18 DIAGNOSIS — R05 Cough: Secondary | ICD-10-CM | POA: Diagnosis not present

## 2018-12-18 DIAGNOSIS — R809 Proteinuria, unspecified: Secondary | ICD-10-CM | POA: Diagnosis not present

## 2018-12-18 DIAGNOSIS — R0600 Dyspnea, unspecified: Secondary | ICD-10-CM | POA: Diagnosis not present

## 2018-12-18 DIAGNOSIS — E1029 Type 1 diabetes mellitus with other diabetic kidney complication: Secondary | ICD-10-CM | POA: Diagnosis not present

## 2018-12-19 ENCOUNTER — Other Ambulatory Visit: Payer: Self-pay | Admitting: Pharmacy Technician

## 2018-12-19 DIAGNOSIS — J4530 Mild persistent asthma, uncomplicated: Secondary | ICD-10-CM | POA: Diagnosis not present

## 2018-12-19 NOTE — Patient Outreach (Signed)
[facility]) Care Management  12/19/2018  [name] 07-Mar-1961 [id number]   Received fax from [place] patient assistance confirming patient has been approved for Rexulti as of 8/11 until 05/08/19.   12/20/2018 3:14pm   Successful call placed to patients mom regarding patient assistance update for Rexulti, HIPAA identifiers verified. Informed [name] of above details. Also informed her [employer] Clan would be outreaching them to set up shipment of the medication. Provided Ms. Marchant with contact number to [place] in case they have not be contacted in the next couple of days.  Discussed with Ms. Bahl that applications for Jardiance and Novolog have not been submitted due to both companies requiring the LIS letter due to [place] household income. Ms. Veasey states that she will follow up with Quillian Quince to make sure he has not received since her last conversation with [facility] RPh Colleen S. Requested that she contact me if she has any issues contacting [place].  Follow up:  Will submit applications to companies once LIS letter has been obtained  [employer]. [name] [place] Certified Pharmacy Technician [place] Management Direct Dial:[phone]

## 2018-12-23 ENCOUNTER — Encounter: Payer: Self-pay | Admitting: Intensive Care

## 2018-12-23 ENCOUNTER — Emergency Department
Admission: EM | Admit: 2018-12-23 | Discharge: 2018-12-24 | Disposition: A | Discharge status: Home / Self Care | Payer: PPO | Attending: Student in an Organized Health Care Education/Training Program | Admitting: Student in an Organized Health Care Education/Training Program

## 2018-12-23 ENCOUNTER — Emergency Department: Payer: PPO

## 2018-12-23 ENCOUNTER — Other Ambulatory Visit: Payer: Self-pay

## 2018-12-23 DIAGNOSIS — Z8673 Personal history of transient ischemic attack (TIA), and cerebral infarction without residual deficits: Secondary | ICD-10-CM | POA: Insufficient documentation

## 2018-12-23 DIAGNOSIS — F32A Depression, unspecified: Secondary | ICD-10-CM

## 2018-12-23 DIAGNOSIS — F329 Major depressive disorder, single episode, unspecified: Secondary | ICD-10-CM | POA: Diagnosis not present

## 2018-12-23 DIAGNOSIS — F332 Major depressive disorder, recurrent severe without psychotic features: Secondary | ICD-10-CM | POA: Diagnosis not present

## 2018-12-23 DIAGNOSIS — Z79899 Other long term (current) drug therapy: Secondary | ICD-10-CM | POA: Diagnosis not present

## 2018-12-23 DIAGNOSIS — E039 Hypothyroidism, unspecified: Secondary | ICD-10-CM | POA: Diagnosis not present

## 2018-12-23 DIAGNOSIS — Z20828 Contact with and (suspected) exposure to other viral communicable diseases: Secondary | ICD-10-CM | POA: Diagnosis not present

## 2018-12-23 DIAGNOSIS — Z794 Long term (current) use of insulin: Secondary | ICD-10-CM | POA: Diagnosis not present

## 2018-12-23 DIAGNOSIS — R45851 Suicidal ideations: Secondary | ICD-10-CM | POA: Insufficient documentation

## 2018-12-23 DIAGNOSIS — I10 Essential (primary) hypertension: Secondary | ICD-10-CM | POA: Diagnosis present

## 2018-12-23 DIAGNOSIS — Z7982 Long term (current) use of aspirin: Secondary | ICD-10-CM | POA: Insufficient documentation

## 2018-12-23 DIAGNOSIS — R05 Cough: Secondary | ICD-10-CM | POA: Diagnosis not present

## 2018-12-23 DIAGNOSIS — Z87891 Personal history of nicotine dependence: Secondary | ICD-10-CM | POA: Insufficient documentation

## 2018-12-23 DIAGNOSIS — Z03818 Encounter for observation for suspected exposure to other biological agents ruled out: Secondary | ICD-10-CM | POA: Diagnosis not present

## 2018-12-23 DIAGNOSIS — E109 Type 1 diabetes mellitus without complications: Secondary | ICD-10-CM | POA: Diagnosis not present

## 2018-12-23 DIAGNOSIS — K219 Gastro-esophageal reflux disease without esophagitis: Secondary | ICD-10-CM | POA: Diagnosis present

## 2018-12-23 DIAGNOSIS — E785 Hyperlipidemia, unspecified: Secondary | ICD-10-CM | POA: Diagnosis present

## 2018-12-23 DIAGNOSIS — E1065 Type 1 diabetes mellitus with hyperglycemia: Secondary | ICD-10-CM | POA: Diagnosis present

## 2018-12-23 DIAGNOSIS — F339 Major depressive disorder, recurrent, unspecified: Secondary | ICD-10-CM | POA: Diagnosis present

## 2018-12-23 LAB — CBC
HCT: 48.4 % (ref 39.0–52.0)
Hemoglobin: 16.2 g/dL (ref 13.0–17.0)
MCH: 29.6 pg (ref 26.0–34.0)
MCHC: 33.5 g/dL (ref 30.0–36.0)
MCV: 88.3 fL (ref 80.0–100.0)
Platelets: 293 10*3/uL (ref 150–400)
RBC: 5.48 MIL/uL (ref 4.22–5.81)
RDW: 13.9 % (ref 11.5–15.5)
WBC: 8.3 10*3/uL (ref 4.0–10.5)
nRBC: 0 % (ref 0.0–0.2)

## 2018-12-23 LAB — COMPREHENSIVE METABOLIC PANEL
ALT: 17 U/L (ref 0–44)
AST: 14 U/L — ABNORMAL LOW (ref 15–41)
Albumin: 3.8 g/dL (ref 3.5–5.0)
Alkaline Phosphatase: 111 U/L (ref 38–126)
Anion gap: 9 (ref 5–15)
BUN: 30 mg/dL — ABNORMAL HIGH (ref 6–20)
CO2: 22 mmol/L (ref 22–32)
Calcium: 9.7 mg/dL (ref 8.9–10.3)
Chloride: 106 mmol/L (ref 98–111)
Creatinine, Ser: 1.38 mg/dL — ABNORMAL HIGH (ref 0.61–1.24)
GFR calc Af Amer: >60 mL/min (ref >60)
GFR calc non Af Amer: 57 mL/min — ABNORMAL LOW (ref >60)
Glucose, Bld: 94 mg/dL (ref 70–99)
Potassium: 3.9 mmol/L (ref 3.5–5.1)
Sodium: 137 mmol/L (ref 135–145)
Total Bilirubin: 0.5 mg/dL (ref 0.3–1.2)
Total Protein: 7.3 g/dL (ref 6.5–8.1)

## 2018-12-23 LAB — URINE DRUG SCREEN, QUALITATIVE (ARMC ONLY)
Amphetamines, Ur Screen: NOT DETECTED
Barbiturates, Ur Screen: NOT DETECTED
Benzodiazepine, Ur Scrn: NOT DETECTED
Cannabinoid 50 Ng, Ur ~~LOC~~: NOT DETECTED
Cocaine Metabolite,Ur ~~LOC~~: NOT DETECTED
MDMA (Ecstasy)Ur Screen: NOT DETECTED
Methadone Scn, Ur: NOT DETECTED
Opiate, Ur Screen: NOT DETECTED
Phencyclidine (PCP) Ur S: NOT DETECTED
Tricyclic, Ur Screen: NOT DETECTED

## 2018-12-23 LAB — ACETAMINOPHEN LEVEL: Acetaminophen (Tylenol), Serum: <10 ug/mL — ABNORMAL LOW (ref 10–30)

## 2018-12-23 LAB — SALICYLATE LEVEL: Salicylate Lvl: <7 mg/dL (ref 2.8–30.0)

## 2018-12-23 LAB — GLUCOSE, CAPILLARY: Glucose-Capillary: 93 mg/dL (ref 70–99)

## 2018-12-23 LAB — ETHANOL: Alcohol, Ethyl (B): <10 mg/dL (ref <10)

## 2018-12-23 LAB — SARS CORONAVIRUS 2 BY RT PCR (HOSPITAL ORDER, PERFORMED IN ~~LOC~~ HOSPITAL LAB): SARS Coronavirus 2: NEGATIVE

## 2018-12-23 MED ORDER — INSULIN ASPART 100 UNIT/ML ~~LOC~~ SOLN
0.0000 [IU] | Freq: Three times a day (TID) | SUBCUTANEOUS | Status: DC
  Administered 2018-12-24: 2 [IU] via SUBCUTANEOUS
  Filled 2018-12-23: qty 1

## 2018-12-23 MED ORDER — INSULIN ASPART 100 UNIT/ML ~~LOC~~ SOLN: 0.0000 [IU] | Freq: Every day | SUBCUTANEOUS | Status: DC

## 2018-12-23 NOTE — ED Notes (Signed)
Pt insulin pump went off indicating CBG 71, EDP notified and a sandwich tray providing to pt.

## 2018-12-23 NOTE — ED Provider Notes (Signed)
[facility] Emergency Department Provider Note    First MD Initiated Contact with Patient 12/23/18 2211     (approximate)  I have reviewed the triage vital signs and the nursing notes.   HISTORY  Chief Complaint Depression and Suicidal    HPI [name] is a 58 y.o. male history of depression previous history of suicide attempt presents the ER for evaluation of 1 month of worsening depression feeling hopeless.  Denies any SI or HI.  States that he talked with his outpatient psychiatry today who recommended he come for inpatient evaluation.  States his been compliant with his medications.  No fevers.  States he does have a chronic cough.    Past Medical History:  Diagnosis Date  . Depression   . Diabetes (HCC)    Insulin Pump  . Diabetes mellitus type I ([place])   . Diabetes mellitus without complication ([place])   . GERD (gastroesophageal reflux disease)   . H/O laryngectomy   . Heel bone fracture   . Hyperlipidemia   . Hypertension   . Radicular pain of right lower extremity   . Stroke ([place])   . Suicide attempt [facility]) 2014   damaged larynx - tracheostomy  . Thyroid disease    Family History  Problem Relation Age of Onset  . Osteoporosis Mother   . Diabetes Mother   . Hypertension Father    Past Surgical History:  Procedure Laterality Date  . COLONOSCOPY WITH PROPOFOL N/A 05/15/2018   Procedure: COLONOSCOPY WITH PROPOFOL;  Surgeon: [name], [name], MD;  Location: [facility] ENDOSCOPY;  Service: Gastroenterology;  Laterality: N/A;  . ESOPHAGOGASTRODUODENOSCOPY N/A 05/15/2018   Procedure: ESOPHAGOGASTRODUODENOSCOPY (EGD);  Surgeon: [name], [name], MD;  Location: [facility] ENDOSCOPY;  Service: Gastroenterology;  Laterality: N/A;  . FRACTURE SURGERY    . Heel bone reconstruction Left   . HERNIA REPAIR  88/8916   Umbilical hernia repair   . LARYNGECTOMY    . NECK SURGERY     fusion  . SPINE SURGERY    . TRACHEOSTOMY  2014   from SI attempt    Patient Active Problem List   Diagnosis Date Noted  . Type 1 diabetes mellitus ([place]) 09/14/2016  . Major depressive disorder, recurrent severe without psychotic features ([place]) 09/13/2016  . Hypothyroidism 09/13/2016  . HTN (hypertension) 09/13/2016  . Dyslipidemia 09/13/2016  . GERD (gastroesophageal reflux disease) 09/13/2016      Prior to Admission medications   Medication Sig Start Date End Date Taking? Authorizing Provider  amLODipine (NORVASC) 5 MG tablet Take 5 mg by mouth daily.  10/09/16   [provider]  aspirin EC 81 MG tablet Take 1 tablet by mouth daily.    [provider]  Brexpiprazole (REXULTI) 0.5 MG TABS Take 0.5 mg by mouth daily.    [provider]  empagliflozin (JARDIANCE) 25 MG TABS tablet Take 25 mg by mouth daily.    [provider]  fluticasone (FLONASE) 50 MCG/ACT nasal spray Place 2 sprays into both nostrils daily.    [provider]  fluvoxaMINE (LUVOX) 100 MG tablet Take 2 tablets (200 mg total) by mouth at bedtime. 12/26/17   Eksir, Richard Miu, MD  insulin aspart (NOVOLOG) 100 UNIT/ML injection Inject 1.15-1.25 Units into the skin See admin instructions. Via insulin pump-basal rate: 1.15-1.25 units/hr (every 8 carbs = 1 unit of insulin bolus) 0000-0800 = 1.15 units/hr and 0801/2359 = 1.25 units/hr 06/26/16   [provider]  levothyroxine ([place], [place]) 75  MCG tablet Take 75 mcg by mouth daily before breakfast.    [provider]  losartan-hydrochlorothiazide (HYZAAR) 100-25 MG tablet Take 1 tablet by mouth daily.  01/02/17   [provider]  Multiple Vitamin (MULTIVITAMIN) tablet Take 1 tablet by mouth daily.    [provider]  pantoprazole (PROTONIX) 40 MG tablet Take 1 tablet by mouth 2 (two) times daily. Taking one tablet daily 05/09/16   [provider]  simvastatin (ZOCOR) 20 MG tablet Take 1 tablet by mouth every evening.     [provider]   traZODone (DESYREL) 100 MG tablet Take 100 mg by mouth at bedtime.    [provider]  vardenafil (LEVITRA) 20 MG tablet Take 20 mg by mouth daily as needed for erectile dysfunction.    [provider]    Allergies Buspar [buspirone], Depakote [valproic acid], Clopidogrel, and Gabapentin    Social History Social History   Tobacco Use  . Smoking status: Former Smoker    Packs/day: 0.00    Types: Cigarettes    Quit date: 04/09/2013    Years since quitting: 5.7  . Smokeless tobacco: Never Used  Substance Use Topics  . Alcohol use: Yes    Alcohol/week: 2.0 standard drinks    Types: 2 Shots of liquor per week    Comment: rare  . Drug use: No    Comment: Pt denied; UDS not available    Review of Systems Patient denies headaches, rhinorrhea, blurry vision, numbness, shortness of breath, chest pain, edema, cough, abdominal pain, nausea, vomiting, diarrhea, dysuria, fevers, rashes or hallucinations unless otherwise stated above in HPI. ____________________________________________   PHYSICAL EXAM:  VITAL SIGNS: Vitals:   12/23/18 2212  BP: (!) 154/105  Pulse: 74  Resp: (!) 22  SpO2: 96%    Constitutional: Alert and oriented.  Eyes: Conjunctivae are normal.  Head: Atraumatic. Nose: No congestion/rhinnorhea. Mouth/Throat: Mucous membranes are moist.   Neck: No stridor. Painless ROM. Tracheostomy c/d/i Cardiovascular: Normal rate, regular rhythm. Grossly normal heart sounds.  Good peripheral circulation. Respiratory: Normal respiratory effort.  No retractions. Lungs CTAB. Gastrointestinal: Soft and nontender. No distention. No abdominal bruits. No CVA tenderness. Genitourinary:  Musculoskeletal: No lower extremity tenderness nor edema.  No joint effusions. Neurologic:  Normal speech and language. No gross focal neurologic deficits are appreciated. No facial droop Skin:  Skin is warm, dry and intact. No rash noted. Psychiatric: Mood and affect are normal.  Speech and behavior are normal.  ____________________________________________   LABS (all labs ordered are listed, but only abnormal results are displayed)  Results for orders placed or performed during the hospital encounter of 12/23/18 (from the past 24 hour(s))  Glucose, capillary     Status: None   Collection Time: 12/23/18  5:04 PM  Result Value Ref Range   Glucose-Capillary 93 70 - 99 mg/dL  Comprehensive metabolic panel     Status: Abnormal   Collection Time: 12/23/18  5:16 PM  Result Value Ref Range   Sodium 137 135 - 145 mmol/L   Potassium 3.9 3.5 - 5.1 mmol/L   Chloride 106 98 - 111 mmol/L   CO2 22 22 - 32 mmol/L   Glucose, Bld 94 70 - 99 mg/dL   BUN 30 (H) 6 - 20 mg/dL   Creatinine, Ser 1.38 (H) 0.61 - 1.24 mg/dL   Calcium 9.7 8.9 - 10.3 mg/dL   Total Protein 7.3 6.5 - 8.1 g/dL   Albumin 3.8 3.5 - 5.0 g/dL   AST 14 (  L) 15 - 41 U/L   ALT 17 0 - 44 U/L   Alkaline Phosphatase 111 38 - 126 U/L   Total Bilirubin 0.5 0.3 - 1.2 mg/dL   GFR calc non Af Amer 57 (L) >60 mL/min   GFR calc Af Amer >60 >60 mL/min   Anion gap 9 5 - 15  Ethanol     Status: None   Collection Time: 12/23/18  5:16 PM  Result Value Ref Range   Alcohol, Ethyl (B) <19 <14 mg/dL  Salicylate level     Status: None   Collection Time: 12/23/18  5:16 PM  Result Value Ref Range   Salicylate Lvl <7.8 2.8 - 30.0 mg/dL  Acetaminophen level     Status: Abnormal   Collection Time: 12/23/18  5:16 PM  Result Value Ref Range   Acetaminophen (Tylenol), Serum <10 (L) 10 - 30 ug/mL  cbc     Status: None   Collection Time: 12/23/18  5:16 PM  Result Value Ref Range   WBC 8.3 4.0 - 10.5 K/uL   RBC 5.48 4.22 - 5.81 MIL/uL   Hemoglobin 16.2 13.0 - 17.0 g/dL   HCT 48.4 39.0 - 52.0 %   MCV 88.3 80.0 - 100.0 fL   MCH 29.6 26.0 - 34.0 pg   MCHC 33.5 30.0 - 36.0 g/dL   RDW 13.9 11.5 - 15.5 %   Platelets 293 150 - 400 K/uL   nRBC 0.0 0.0 - 0.2 %  Urine Drug Screen, Qualitative     Status: None   Collection Time:  12/23/18  5:16 PM  Result Value Ref Range   Tricyclic, Ur Screen NONE DETECTED NONE DETECTED   Amphetamines, Ur Screen NONE DETECTED NONE DETECTED   MDMA (Ecstasy)Ur Screen NONE DETECTED NONE DETECTED   Cocaine Metabolite,Ur [place] NONE DETECTED NONE DETECTED   Opiate, Ur Screen NONE DETECTED NONE DETECTED   Phencyclidine (PCP) Ur S NONE DETECTED NONE DETECTED   Cannabinoid 50 Ng, Ur  NONE DETECTED NONE DETECTED   Barbiturates, Ur Screen NONE DETECTED NONE DETECTED   Benzodiazepine, Ur Scrn NONE DETECTED NONE DETECTED   Methadone Scn, Ur NONE DETECTED NONE DETECTED   ____________________________________________ ____________________________________________  RADIOLOGY  I personally reviewed all radiographic images ordered to evaluate for the above acute complaints and reviewed radiology reports and findings.  These findings were personally discussed with the patient.  Please see medical record for radiology report.  ____________________________________________   PROCEDURES  Procedure(s) performed:  Procedures    Critical Care performed: no ____________________________________________   INITIAL IMPRESSION / ASSESSMENT AND PLAN / ED COURSE  Pertinent labs & imaging results that were available during my care of the patient were reviewed by me and considered in my medical decision making (see chart for details).   DDX: Psychosis, delirium, medication effect, noncompliance, polysubstance abuse, Si, Hi, depression   Tashawn Laswell is a 58 y.o. who presents to the ED with for evaluation of depression.  Patient has psych history of depression.  Laboratory testing was ordered to evaluation for underlying electrolyte derangement or signs of underlying organic pathology to explain today's presentation.  Based on history and physical and laboratory evaluation, it appears that the patient's presentation is 2/2 underlying psychiatric disorder and will require further evaluation and  management by inpatient psychiatry.  He does not meet criteria for IVC at this time and is agreeable to voluntary evaluation.      The patient was evaluated in Emergency Department today for the symptoms described in  the history of present illness. He/she was evaluated in the context of the global COVID-19 pandemic, which necessitated consideration that the patient might be at risk for infection with the SARS-CoV-2 virus that causes COVID-19. Institutional protocols and algorithms that pertain to the evaluation of patients at risk for COVID-19 are in a state of rapid change based on information released by regulatory bodies including the CDC and federal and state organizations. These policies and algorithms were followed during the patient's care in the ED.  As part of my medical decision making, I reviewed the following data within the [place] notes reviewed and incorporated, Labs reviewed, notes from prior ED visits and [place] Controlled Substance Database   ____________________________________________   FINAL CLINICAL IMPRESSION(S) / ED DIAGNOSES  Final diagnoses:  Depression, unspecified depression type      NEW MEDICATIONS STARTED DURING THIS VISIT:  New Prescriptions   No medications on file     Note:  This document was prepared using Dragon voice recognition software and may include unintentional dictation errors.    Merlyn Lot, MD 12/23/18 2248

## 2018-12-23 NOTE — ED Notes (Signed)
Kennyth Lose, NP at bedside

## 2018-12-23 NOTE — ED Notes (Signed)
Pharmacy contacted to verify home meds

## 2018-12-23 NOTE — ED Triage Notes (Addendum)
Patient reports he has been depressed for about a month and has no motivation to do anything. Denies SI/HI at the moment but states "I do think about hurting myself often. I think about killing myself but I never act on it or have a plan" Patient has laryngectomy present. Reports it needs suctioning often. Patient broke his fibula and foot from fall in bathroom and has boot on right leg/foot. Patient has paper with him telling all supplies he needs for trach care and recently told he has infection in his lungs and needs antibiotic picked up. PAtient is type 1 diabetic. Insulin pump located on right lower abdomen and patient has censor that reads blood sugar

## 2018-12-23 NOTE — ED Notes (Signed)
Pt provided with meal tray in subwait.

## 2018-12-23 NOTE — ED Notes (Signed)
PT states that he thinks about killing himself but has not tried. Denies an actual plan. Calm and cooperative. Seems to be overwhelmed with medical issues.

## 2018-12-24 ENCOUNTER — Other Ambulatory Visit: Payer: Self-pay | Admitting: Pharmacist

## 2018-12-24 ENCOUNTER — Encounter: Payer: Self-pay | Admitting: Behavioral Health

## 2018-12-24 DIAGNOSIS — F332 Major depressive disorder, recurrent severe without psychotic features: Secondary | ICD-10-CM | POA: Diagnosis not present

## 2018-12-24 LAB — GLUCOSE, CAPILLARY
Glucose-Capillary: 124 mg/dL — ABNORMAL HIGH (ref 70–99)
Glucose-Capillary: 127 mg/dL — ABNORMAL HIGH (ref 70–99)
Glucose-Capillary: 155 mg/dL — ABNORMAL HIGH (ref 70–99)
Glucose-Capillary: 332 mg/dL — ABNORMAL HIGH (ref 70–99)

## 2018-12-24 LAB — HEMOGLOBIN A1C
Hgb A1c MFr Bld: 7.7 % — ABNORMAL HIGH (ref 4.8–5.6)
Mean Plasma Glucose: 174.29 mg/dL

## 2018-12-24 MED ORDER — TRAZODONE HCL 100 MG PO TABS
100.0000 mg | ORAL_TABLET | Freq: Every day | ORAL | Status: DC
  Administered 2018-12-24: 100 mg via ORAL
  Filled 2018-12-24: qty 1

## 2018-12-24 MED ORDER — SIMVASTATIN 10 MG PO TABS: 20.0000 mg | ORAL_TABLET | Freq: Every evening | ORAL | Status: DC

## 2018-12-24 MED ORDER — TRAMADOL HCL 50 MG PO TABS
50.0000 mg | ORAL_TABLET | Freq: Once | ORAL | Status: AC
  Administered 2018-12-24: 50 mg via ORAL
  Filled 2018-12-24: qty 1

## 2018-12-24 MED ORDER — TRAZODONE HCL 100 MG PO TABS: 100.0000 mg | ORAL_TABLET | Freq: Every day | ORAL | Status: DC

## 2018-12-24 MED ORDER — BREXPIPRAZOLE 1 MG PO TABS
0.5000 mg | ORAL_TABLET | Freq: Every day | ORAL | Status: DC
  Administered 2018-12-24: 0.5 mg via ORAL
  Filled 2018-12-24: qty 1

## 2018-12-24 MED ORDER — FLUTICASONE PROPIONATE 50 MCG/ACT NA SUSP
2.0000 | Freq: Every day | NASAL | Status: DC
  Administered 2018-12-24: 2 via NASAL
  Filled 2018-12-24: qty 16

## 2018-12-24 MED ORDER — ACETYLCYSTEINE 20 % IN SOLN
4.0000 mL | Freq: Four times a day (QID) | RESPIRATORY_TRACT | Status: DC
  Administered 2018-12-24 (×3): 4 mL via RESPIRATORY_TRACT
  Filled 2018-12-24 (×5): qty 4

## 2018-12-24 MED ORDER — PANTOPRAZOLE SODIUM 40 MG PO TBEC
40.0000 mg | DELAYED_RELEASE_TABLET | Freq: Two times a day (BID) | ORAL | Status: DC
  Administered 2018-12-24 (×2): 40 mg via ORAL
  Filled 2018-12-24 (×2): qty 1

## 2018-12-24 MED ORDER — INSULIN ASPART 100 UNIT/ML ~~LOC~~ SOLN
6.0000 [IU] | Freq: Three times a day (TID) | SUBCUTANEOUS | Status: DC
  Administered 2018-12-24: 6 [IU] via INTRAVENOUS
  Filled 2018-12-24: qty 1

## 2018-12-24 MED ORDER — ADULT MULTIVITAMIN W/MINERALS CH
1.0000 | ORAL_TABLET | Freq: Every day | ORAL | Status: DC
  Administered 2018-12-24: 1 via ORAL
  Filled 2018-12-24: qty 1

## 2018-12-24 MED ORDER — FLUVOXAMINE MALEATE 50 MG PO TABS
200.0000 mg | ORAL_TABLET | Freq: Every day | ORAL | Status: DC
  Filled 2018-12-24: qty 4

## 2018-12-24 MED ORDER — HYDROCHLOROTHIAZIDE 25 MG PO TABS
25.0000 mg | ORAL_TABLET | Freq: Every day | ORAL | Status: DC
  Administered 2018-12-24: 25 mg via ORAL
  Filled 2018-12-24: qty 1

## 2018-12-24 MED ORDER — LOSARTAN POTASSIUM 50 MG PO TABS
25.0000 mg | ORAL_TABLET | Freq: Every day | ORAL | Status: DC
  Administered 2018-12-24: 25 mg via ORAL
  Filled 2018-12-24: qty 1

## 2018-12-24 MED ORDER — LEVOFLOXACIN 750 MG PO TABS
750.0000 mg | ORAL_TABLET | Freq: Once | ORAL | Status: AC
  Administered 2018-12-24: 750 mg via ORAL
  Filled 2018-12-24: qty 1

## 2018-12-24 MED ORDER — TRAMADOL HCL 50 MG PO TABS
100.0000 mg | ORAL_TABLET | Freq: Two times a day (BID) | ORAL | Status: DC
  Administered 2018-12-24: 100 mg via ORAL
  Filled 2018-12-24 (×2): qty 2

## 2018-12-24 MED ORDER — LOSARTAN POTASSIUM-HCTZ 100-25 MG PO TABS: 1.0000 | ORAL_TABLET | Freq: Every day | ORAL | Status: DC

## 2018-12-24 MED ORDER — ASPIRIN EC 81 MG PO TBEC
81.0000 mg | DELAYED_RELEASE_TABLET | Freq: Every day | ORAL | Status: DC
  Administered 2018-12-24: 81 mg via ORAL
  Filled 2018-12-24: qty 1

## 2018-12-24 MED ORDER — INSULIN ASPART 100 UNIT/ML ~~LOC~~ SOLN: 0.0000 [IU] | Freq: Every day | SUBCUTANEOUS | Status: DC

## 2018-12-24 MED ORDER — LEVOTHYROXINE SODIUM 50 MCG PO TABS
75.0000 ug | ORAL_TABLET | Freq: Every day | ORAL | Status: DC
  Administered 2018-12-24: 75 ug via ORAL
  Filled 2018-12-24: qty 2

## 2018-12-24 MED ORDER — INSULIN GLARGINE 100 UNIT/ML ~~LOC~~ SOLN
25.0000 [IU] | Freq: Every day | SUBCUTANEOUS | Status: DC
  Administered 2018-12-24: 25 [IU] via SUBCUTANEOUS
  Filled 2018-12-24 (×2): qty 0.25

## 2018-12-24 MED ORDER — INSULIN ASPART 100 UNIT/ML ~~LOC~~ SOLN
0.0000 [IU] | Freq: Three times a day (TID) | SUBCUTANEOUS | Status: DC
  Administered 2018-12-24: 11 [IU] via SUBCUTANEOUS
  Administered 2018-12-24: 2 [IU] via SUBCUTANEOUS
  Filled 2018-12-24 (×2): qty 1

## 2018-12-24 MED ORDER — AMLODIPINE BESYLATE 5 MG PO TABS
5.0000 mg | ORAL_TABLET | Freq: Every day | ORAL | Status: DC
  Administered 2018-12-24: 5 mg via ORAL
  Filled 2018-12-24: qty 1

## 2018-12-24 NOTE — Patient Outreach (Addendum)
[facility]) Care Management [place]  12/24/2018  [name] September 01, 1960 [id number]  Patient has been approved for Rexulti patient assistance program as well FULL Extra Help LIS.   Noted patient in ED for depression.  Successful call today to patient's mother, [name].  Reviewed Rexulti program and Extra Help program with mother who voiced understanding.  She will wait to contact [place] program to schedule delivery of medication when patient is discharged in case there are medication changes.  She is aware the [place] program ends 05/08/2019 however Extra Help will continue unless income or asset status changes for patient.  He may need to re-verity income and assets with [employer] in the future to keep Extra Help  Mother is very appreciative of [facility] efforts and has no further questions at this time.     Plan: [place] case is being closed due to the following reasons: -Goals of care have been met. -I have provided my contact information if patient or family needs to reach out to me in the future.  -Thank you for allowing [facility] pharmacy to be involved in this patient's care.   Ralene Bathe, PharmD, [place] [phone]

## 2018-12-24 NOTE — ED Notes (Signed)
Contacted [facility] to find out plan, pt is becoming anxious. States she will talk to [place] NP and call back.

## 2018-12-24 NOTE — Consult Note (Signed)
[facility] Face-to-Face Psychiatry Consult   Reason for Consult:  Depression Referring Physician:  EDP Patient Identification: [name] MRN:  [id number] Principal Diagnosis: Major depressive disorder, recurrent severe without psychotic features ([place]) Diagnosis:  Principal Problem:   Major depressive disorder, recurrent severe without psychotic features ([place]) Active Problems:   Hypothyroidism   HTN (hypertension)   Dyslipidemia   GERD (gastroesophageal reflux disease)   Type 1 diabetes mellitus ([place])   Total Time spent with patient: 30 minutes  Subjective:   [name] is a 58 y.o. male patient reports today that he is feeling better.  He denies any current suicidal homicidal ideations and denies any hallucinations.  Patient states that he was seeing his provider in outpatient and his psychiatrist was wanting him to go see a therapist.  It was reported that he had a therapist upon back but stopped going.  After discussing the possibilities patient feels that getting outpatient therapy would be more beneficial than an inpatient stay.  Patient continues denying any thoughts of wanting to harm or kill himself.  He does state that in the past that he has had thoughts with no plan and no intent.  Patient's wife, [name], was contacted at [phone].  She states that she feels the patient will be safe for discharge and that she will assist him in therapy follow-up.  Patient's mother, [name], was contacted at the request of the patient.  Patient's mother had no concerns with the patient being discharged today and said that she would be the one that would probably become the patient up.  She states that she would also be there to support and assist with follow-up treatment  HPI:  Per Dr. Quentin Holder; [name] a 58 y.o.malehistory of depression previous history of suicide attempt presents the ER for evaluation of 1 month of worsening depression feeling hopeless. Denies any SI or  HI. States that he talked with his outpatient psychiatry today who recommended he come for inpatient evaluation. States his been compliant with his medications. No fevers. States he does have a chronic cough.  Patient is seen by me face-to-face. The patient continues to deny any suicidal or homicidal ideations and hallucinations.  Patient is pleasant, calm, and cooperative.  Patient does not meet inpatient criteria and is psychiatrically cleared.  I have notified Dr. Jari Holder about the recommendations.  Patient will be provided with outpatient therapy resources and will continue seeing [name] for his outpatient treatment.  Past Psychiatric History: depression, suicide attempt  Risk to Self: Suicidal Ideation: Yes-Currently Present Suicidal Intent: No Is patient at risk for suicide?: Yes Suicidal Plan?: No What has been your use of drugs/alcohol within the last 12 months?: no current use  How many times?: 1 Other Self Harm Risks: none Triggers for Past Attempts: Unknown Intentional Self Injurious Behavior: None Risk to Others: Homicidal Ideation: No Thoughts of Harm to Others: No Current Homicidal Intent: No Current Homicidal Plan: No Access to Homicidal Means: No Identified Victim: none History of harm to others?: No Assessment of Violence: None Noted Violent Behavior Description: none Does patient have access to weapons?: No Criminal Charges Pending?: No Does patient have a court date: No Prior Inpatient Therapy: Prior Inpatient Therapy: Yes Prior Therapy Dates: 2018 Prior Therapy Facilty/Provider(s): [facility] Reason for Treatment: Depression  Prior Outpatient Therapy: Prior Outpatient Therapy: Yes Prior Therapy Dates: Current Prior Therapy Facilty/Provider(s): Dr. Sheppard Coil A. Joel Offer, MD Reason for Treatment: Depression Does patient have an ACCT team?: No Does patient have Intensive  In-House Services?  : No Does patient have [facility] services? : No Does patient have P4CC  services?: No  Past Medical History:  Past Medical History:  Diagnosis Date  . Depression   . Diabetes (HCC)    Insulin Pump  . Diabetes mellitus type I ([place])   . Diabetes mellitus without complication ([place])   . GERD (gastroesophageal reflux disease)   . H/O laryngectomy   . Heel bone fracture   . Hyperlipidemia   . Hypertension   . Radicular pain of right lower extremity   . Stroke ([place])   . Suicide attempt [facility]) 2014   damaged larynx - tracheostomy  . Thyroid disease     Past Surgical History:  Procedure Laterality Date  . COLONOSCOPY WITH PROPOFOL N/A 05/15/2018   Procedure: COLONOSCOPY WITH PROPOFOL;  Surgeon: [name], [name], MD;  Location: [facility] ENDOSCOPY;  Service: Gastroenterology;  Laterality: N/A;  . ESOPHAGOGASTRODUODENOSCOPY N/A 05/15/2018   Procedure: ESOPHAGOGASTRODUODENOSCOPY (EGD);  Surgeon: [name], [name], MD;  Location: [facility] ENDOSCOPY;  Service: Gastroenterology;  Laterality: N/A;  . FRACTURE SURGERY    . Heel bone reconstruction Left   . HERNIA REPAIR  27/2536   Umbilical hernia repair   . LARYNGECTOMY    . NECK SURGERY     fusion  . SPINE SURGERY    . TRACHEOSTOMY  2014   from SI attempt   Family History:  Family History  Problem Relation Age of Onset  . Osteoporosis Mother   . Diabetes Mother   . Hypertension Father    Family Psychiatric  History: None reported  Social History:  Social History   Substance and Sexual Activity  Alcohol Use Yes  . Alcohol/week: 2.0 standard drinks  . Types: 2 Shots of liquor per week   Comment: rare     Social History   Substance and Sexual Activity  Drug Use No   Comment: Pt denied; UDS not available    Social History   Socioeconomic History  . Marital status: Married    Spouse name: Not on file  . Number of children: Not on file  . Years of education: Not on file  . Highest education level: Not on file  Occupational History  . Not on file  Social Needs  . Financial resource strain: Not on  file  . Food insecurity    Worry: Not on file    Inability: Not on file  . Transportation needs    Medical: Not on file    Non-medical: Not on file  Tobacco Use  . Smoking status: Former Smoker    Packs/day: 0.00    Types: Cigarettes    Quit date: 04/09/2013    Years since quitting: 5.7  . Smokeless tobacco: Never Used  Substance and Sexual Activity  . Alcohol use: Yes    Alcohol/week: 2.0 standard drinks    Types: 2 Shots of liquor per week    Comment: rare  . Drug use: No    Comment: Pt denied; UDS not available  . Sexual activity: Yes    Partners: Female    Birth control/protection: Condom  Lifestyle  . Physical activity    Days per week: Not on file    Minutes per session: Not on file  . Stress: Not on file  Relationships  . Social Herbalist on phone: Not on file    Gets together: Not on file    Attends religious service: Not on file    Active member  of club or organization: Not on file    Attends meetings of clubs or organizations: Not on file    Relationship status: Not on file  Other Topics Concern  . Not on file  Social History Narrative  . Not on file   Additional Social History:    Allergies:   Allergies  Allergen Reactions  . Buspar [Buspirone]     Makes the patient "flip out"  . Depakote [Valproic Acid]     Causes excessive drowsiness  . Clopidogrel Rash  . Gabapentin Itching and Rash    Labs:  Results for orders placed or performed during the hospital encounter of 12/23/18 (from the past 48 hour(s))  Glucose, capillary     Status: None   Collection Time: 12/23/18  5:04 PM  Result Value Ref Range   Glucose-Capillary 93 70 - 99 mg/dL  Comprehensive metabolic panel     Status: Abnormal   Collection Time: 12/23/18  5:16 PM  Result Value Ref Range   Sodium 137 135 - 145 mmol/L   Potassium 3.9 3.5 - 5.1 mmol/L   Chloride 106 98 - 111 mmol/L   CO2 22 22 - 32 mmol/L   Glucose, Bld 94 70 - 99 mg/dL   BUN 30 (H) 6 - 20 mg/dL    Creatinine, Ser 1.38 (H) 0.61 - 1.24 mg/dL   Calcium 9.7 8.9 - 10.3 mg/dL   Total Protein 7.3 6.5 - 8.1 g/dL   Albumin 3.8 3.5 - 5.0 g/dL   AST 14 (L) 15 - 41 U/L   ALT 17 0 - 44 U/L   Alkaline Phosphatase 111 38 - 126 U/L   Total Bilirubin 0.5 0.3 - 1.2 mg/dL   GFR calc non Af Amer 57 (L) >60 mL/min   GFR calc Af Amer >60 >60 mL/min   Anion gap 9 5 - 15    Comment: Performed at [facility], [address], [place], [place] [address]  Ethanol     Status: None   Collection Time: 12/23/18  5:16 PM  Result Value Ref Range   Alcohol, Ethyl (B) <10 <10 mg/dL    Comment: (NOTE) Lowest detectable limit for serum alcohol is 10 mg/dL. For medical purposes only. Performed at [facility], [address] [place], [place] [address]   Salicylate level     Status: None   Collection Time: 12/23/18  5:16 PM  Result Value Ref Range   Salicylate Lvl <4.9 2.8 - 30.0 mg/dL    Comment: Performed at [facility], [address] [place], [place] [address]  Acetaminophen level     Status: Abnormal   Collection Time: 12/23/18  5:16 PM  Result Value Ref Range   Acetaminophen (Tylenol), Serum <10 (L) 10 - 30 ug/mL    Comment: (NOTE) Therapeutic concentrations vary significantly. A range of 10-30 ug/mL  may be an effective concentration for many patients. However, some  are best treated at concentrations outside of this range. Acetaminophen concentrations >150 ug/mL at 4 hours after ingestion  and >50 ug/mL at 12 hours after ingestion are often associated with  toxic reactions. Performed at [facility], [address] [place], [place] [address]   cbc     Status: None   Collection Time: 12/23/18  5:16 PM  Result Value Ref Range   WBC 8.3 4.0 - 10.5 K/uL   RBC 5.48 4.22 - 5.81 MIL/uL   Hemoglobin 16.2 13.0 - 17.0 g/dL   HCT 48.4 39.0 - 52.0 %  MCV 88.3 80.0 - 100.0 fL   MCH 29.6 26.0 - 34.0 pg   MCHC 33.5 30.0 - 36.0 g/dL   RDW 13.9 11.5 - 15.5 %    Platelets 293 150 - 400 K/uL   nRBC 0.0 0.0 - 0.2 %    Comment: Performed at [facility], [address], [place], [place] [address]  Urine Drug Screen, Qualitative     Status: None   Collection Time: 12/23/18  5:16 PM  Result Value Ref Range   Tricyclic, Ur Screen NONE DETECTED NONE DETECTED   Amphetamines, Ur Screen NONE DETECTED NONE DETECTED   MDMA (Ecstasy)Ur Screen NONE DETECTED NONE DETECTED   Cocaine Metabolite,Ur [place] NONE DETECTED NONE DETECTED   Opiate, Ur Screen NONE DETECTED NONE DETECTED   Phencyclidine (PCP) Ur S NONE DETECTED NONE DETECTED   Cannabinoid 50 Ng, Ur [place] NONE DETECTED NONE DETECTED   Barbiturates, Ur Screen NONE DETECTED NONE DETECTED   Benzodiazepine, Ur Scrn NONE DETECTED NONE DETECTED   Methadone Scn, Ur NONE DETECTED NONE DETECTED    Comment: (NOTE) Tricyclics + metabolites, urine    Cutoff 1000 ng/mL Amphetamines + metabolites, urine  Cutoff 1000 ng/mL MDMA (Ecstasy), urine              Cutoff 500 ng/mL Cocaine Metabolite, urine          Cutoff 300 ng/mL Opiate + metabolites, urine        Cutoff 300 ng/mL Phencyclidine (PCP), urine         Cutoff 25 ng/mL Cannabinoid, urine                 Cutoff 50 ng/mL Barbiturates + metabolites, urine  Cutoff 200 ng/mL Benzodiazepine, urine              Cutoff 200 ng/mL Methadone, urine                   Cutoff 300 ng/mL The urine drug screen provides only a preliminary, unconfirmed analytical test result and should not be used for non-medical purposes. Clinical consideration and professional judgment should be applied to any positive drug screen result due to possible interfering substances. A more specific alternate chemical method must be used in order to obtain a confirmed analytical result. Gas chromatography / mass spectrometry (GC/MS) is the preferred confirmat ory method. Performed at [facility], [address] [place], [place] [address]   Hemoglobin A1c     Status: Abnormal    Collection Time: 12/23/18  5:16 PM  Result Value Ref Range   Hgb A1c MFr Bld 7.7 (H) 4.8 - 5.6 %    Comment: (NOTE) Pre diabetes:          5.7%-6.4% Diabetes:              >6.4% Glycemic control for   <7.0% adults with diabetes    Mean Plasma Glucose 174.29 mg/dL    Comment: Performed at [facility] [address] [place], [place] [address]  SARS Coronavirus 2 [facility] order, Performed in [facility] hospital lab) Nasopharyngeal Nasopharyngeal Swab     Status: None   Collection Time: 12/23/18 10:46 PM   Specimen: Nasopharyngeal Swab  Result Value Ref Range   SARS Coronavirus 2 NEGATIVE NEGATIVE    Comment: (NOTE) If result is NEGATIVE SARS-CoV-2 target nucleic acids are NOT DETECTED. The SARS-CoV-2 RNA is generally detectable in upper and lower  respiratory specimens during the acute phase of infection. The lowest  concentration of SARS-CoV-2 viral copies this assay can detect is 250  copies / mL. A negative result does not preclude SARS-CoV-2 infection  and should not be used as the sole basis for treatment or other  patient management decisions.  A negative result may occur with  improper specimen collection / handling, submission of specimen other  than nasopharyngeal swab, presence of viral mutation(s) within the  areas targeted by this assay, and inadequate number of viral copies  (<250 copies / mL). A negative result must be combined with clinical  observations, patient history, and epidemiological information. If result is POSITIVE SARS-CoV-2 target nucleic acids are DETECTED. The SARS-CoV-2 RNA is generally detectable in upper and lower  respiratory specimens dur ing the acute phase of infection.  Positive  results are indicative of active infection with SARS-CoV-2.  Clinical  correlation with patient history and other diagnostic information is  necessary to determine patient infection status.  Positive results do  not rule out bacterial infection or  co-infection with other viruses. If result is PRESUMPTIVE POSTIVE SARS-CoV-2 nucleic acids MAY BE PRESENT.   A presumptive positive result was obtained on the submitted specimen  and confirmed on repeat testing.  While 2019 novel coronavirus  (SARS-CoV-2) nucleic acids may be present in the submitted sample  additional confirmatory testing may be necessary for epidemiological  and / or clinical management purposes  to differentiate between  SARS-CoV-2 and other Sarbecovirus currently known to infect humans.  If clinically indicated additional testing with an alternate test  methodology [phone]) is advised. The SARS-CoV-2 RNA is generally  detectable in upper and lower respiratory sp ecimens during the acute  phase of infection. The expected result is Negative. Fact Sheet for Patients:  StrictlyIdeas.no Fact Sheet for Healthcare Providers: BankingDealers.co.za This test is not yet approved or cleared by the [place] FDA and has been authorized for detection and/or diagnosis of SARS-CoV-2 by FDA under an Emergency Use Authorization (EUA).  This EUA will remain in effect (meaning this test can be used) for the duration of the COVID-19 declaration under Section 564(b)(1) of the Act, 21 U.S.C. section 360bbb-3(b)(1), unless the authorization is terminated or revoked sooner. Performed at [facility], [address] [place], [place] [address]   Glucose, capillary     Status: Abnormal   Collection Time: 12/24/18 12:40 AM  Result Value Ref Range   Glucose-Capillary 155 (H) 70 - 99 mg/dL  Glucose, capillary     Status: Abnormal   Collection Time: 12/24/18  7:52 AM  Result Value Ref Range   Glucose-Capillary 127 (H) 70 - 99 mg/dL  Glucose, capillary     Status: Abnormal   Collection Time: 12/24/18 12:46 PM  Result Value Ref Range   Glucose-Capillary 124 (H) 70 - 99 mg/dL    Current Facility-Administered Medications   Medication Dose Route Frequency Provider Last Rate Last Dose  . acetylcysteine (MUCOMYST) 20 % nebulizer / oral solution 4 mL  4 mL Nebulization Q6H Alfred Levins, [place], MD   4 mL at 12/24/18 1601  . amLODipine (NORVASC) tablet 5 mg  5 mg Oral Daily Alfred Levins, [place], MD   5 mg at 12/24/18 1022  . aspirin EC tablet 81 mg  81 mg Oral Daily Alfred Levins, [place], MD   81 mg at 12/24/18 1022  . Brexpiprazole TABS 0.5 mg  0.5 mg Oral Daily Alfred Levins, [place], MD   0.5 mg at 12/24/18 1023  . fluticasone (FLONASE) 50 MCG/ACT nasal spray 2 spray  2 spray Each  Nare Daily Alfred Levins, [place], MD   2 spray at 12/24/18 1025  . fluvoxaMINE (LUVOX) tablet 200 mg  200 mg Oral QHS [place], [place], MD      . losartan (COZAAR) tablet 25 mg  25 mg Oral Daily Hart Robinsons A, RPH   25 mg at 12/24/18 1024   And  . hydrochlorothiazide (HYDRODIURIL) tablet 25 mg  25 mg Oral Daily Hart Robinsons A, RPH   25 mg at 12/24/18 1022  . insulin aspart (novoLOG) injection 0-15 Units  0-15 Units Subcutaneous TID WC Vanessa [place], MD   2 Units at 12/24/18 1247  . insulin aspart (novoLOG) injection 0-5 Units  0-5 Units Subcutaneous QHS Vanessa Briscoe, MD      . insulin aspart (novoLOG) injection 6 Units  6 Units Intravenous TID WC Vanessa [place], MD   6 Units at 12/24/18 1245  . insulin glargine (LANTUS) injection 25 Units  25 Units Subcutaneous Daily Vanessa , MD   25 Units at 12/24/18 1242  . levothyroxine (SYNTHROID) tablet 75 mcg  75 mcg Oral QAC breakfast Alfred Levins, [place], MD   75 mcg at 12/24/18 0756  . multivitamin with minerals tablet 1 tablet  1 tablet Oral Daily Alfred Levins, [place], MD   1 tablet at 12/24/18 1025  . pantoprazole (PROTONIX) EC tablet 40 mg  40 mg Oral BID Alfred Levins, [place], MD   40 mg at 12/24/18 1024  . simvastatin (ZOCOR) tablet 20 mg  20 mg Oral QPM Alfred Levins, [place], MD      . traMADol Veatrice Bourbon) tablet 100 mg  100 mg Oral BID Alfred Levins, [place], MD   100 mg at 12/24/18 0755  . traZODone (DESYREL)  tablet 100 mg  100 mg Oral QHS Alfred Levins, [place], MD   100 mg at 12/24/18 4098   Current Outpatient Medications  Medication Sig Dispense Refill  . acetylcysteine (MUCOMYST) 20 % nebulizer solution Take 4 mLs by nebulization every 6 (six) hours.    Marland Kitchen amLODipine (NORVASC) 5 MG tablet Take 5 mg by mouth daily.     Marland Kitchen aspirin EC 81 MG tablet Take 1 tablet by mouth daily.    . Brexpiprazole (REXULTI) 0.5 MG TABS Take 0.5 mg by mouth daily.    . empagliflozin (JARDIANCE) 25 MG TABS tablet Take 25 mg by mouth daily.    . fluticasone (FLONASE) 50 MCG/ACT nasal spray Place 2 sprays into both nostrils daily.    . fluvoxaMINE (LUVOX) 100 MG tablet Take 2 tablets (200 mg total) by mouth at bedtime. 180 tablet 1  . insulin aspart (NOVOLOG) 100 UNIT/ML injection Inject 1.15-1.25 Units into the skin See admin instructions. Via insulin pump-basal rate: 1.15-1.25 units/hr (every 8 carbs = 1 unit of insulin bolus) 0000-0800 = 1.15 units/hr and 0801/2359 = 1.25 units/hr    . levothyroxine (SYNTHROID, LEVOTHROID) 75 MCG tablet Take 75 mcg by mouth daily before breakfast.    . losartan-hydrochlorothiazide (HYZAAR) 100-25 MG tablet Take 1 tablet by mouth daily.     . pantoprazole (PROTONIX) 40 MG tablet Take 1 tablet by mouth 2 (two) times daily. Taking one tablet daily    . simvastatin (ZOCOR) 20 MG tablet Take 1 tablet by mouth every evening.     . traMADol (ULTRAM) 50 MG tablet Take 100 mg by mouth 2 (two) times daily.    . traZODone (DESYREL) 100 MG tablet Take 100 mg by mouth at bedtime.    . Multiple Vitamin (MULTIVITAMIN) tablet Take 1 tablet by mouth daily.    Marland Kitchen  vardenafil (LEVITRA) 20 MG tablet Take 20 mg by mouth daily as needed for erectile dysfunction.      Musculoskeletal: Strength & Muscle Tone: within normal limits Gait & Station: normal Patient leans: N/A  Psychiatric Specialty Exam: Physical Exam  Nursing note and vitals reviewed. Constitutional: He is oriented to person, place, and time. He  appears well-developed and well-nourished.  Cardiovascular: Normal rate.  Respiratory: Effort normal.  Musculoskeletal: Normal range of motion.  Neurological: He is alert and oriented to person, place, and time.    Review of Systems  Constitutional: Negative.   HENT: Negative.   Eyes: Negative.   Respiratory: Negative.   Cardiovascular: Negative.   Gastrointestinal: Negative.   Genitourinary: Negative.   Musculoskeletal: Negative.   Skin: Negative.   Neurological: Negative.   Endo/Heme/Allergies: Negative.   Psychiatric/Behavioral: Positive for depression.    Blood pressure (!) 151/80, pulse 82, temperature 98.1 F (36.7 C), temperature source Oral, resp. rate (!) 21, height 6' (1.829 m), weight 116.1 kg, SpO2 96 %.Body mass index is 34.72 kg/m.  General Appearance: Casual  Eye Contact:  Good  Speech:  Understandable, but has trach   Volume:  Decreased  Mood:  Depressed  Affect:  Congruent  Thought Process:  Coherent and Descriptions of Associations: Intact  Orientation:  Full (Time, Place, and Person)  Thought Content:  WDL  Suicidal Thoughts:  No  Homicidal Thoughts:  No  Memory:  Immediate;   Good Recent;   Good Remote;   Good  Judgement:  Fair  Insight:  Fair  Psychomotor Activity:  Normal  Concentration:  Concentration: Good  Recall:  Good  Fund of Knowledge:  Good  Language:  Good  Akathisia:  No  Handed:  Right  AIMS (if indicated):     Assets:  Communication Skills Desire for Improvement Financial Resources/Insurance Housing Resilience Social Support Transportation  ADL's:  Intact  Cognition:  WNL  Sleep:        Treatment Plan Summary: Follow up with outpatient provider    Disposition: No evidence of imminent risk to self or others at present.   Patient does not meet criteria for psychiatric inpatient admission. Supportive therapy provided about ongoing stressors. Discussed crisis plan, support from social network, calling 911, coming to the  Emergency Department, and calling Suicide Hotline.  [place], FNP 12/24/2018 4:26 PM

## 2018-12-24 NOTE — Consult Note (Signed)
[facility] Consult   Reason for Consult: Depression and suicidal Referring Physician: Dr. Quentin Cornwall Patient Identification: [name] MRN:  [id number] Principal Diagnosis: Major depressive disorder, recurrent severe without psychotic features ([place]) Diagnosis:  Principal Problem:   Major depressive disorder, recurrent severe without psychotic features ([place]) Active Problems:   Hypothyroidism   HTN (hypertension)   Dyslipidemia   GERD (gastroesophageal reflux disease)   Type 1 diabetes mellitus ([place])   Total Time spent with patient: 45 minutes  Subjective: "This is really hard for me.  I feel like there is no point." [name] is a 58 y.o. male patient presented to [name] ED voluntarily. This is a patient who attempted suicide in 2014 intentionally ingesting 30 Percocet tablets. He disclosed he was not trying to kill himself but only wanted to feel better from emotional pain that he was dealing with.  The patient stated he was in a coma for 6 weeks. 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  The patient is currently wearing a boot to his right leg due to him falling in the bathroom about 6 weeks ago.  He stated, he broke his right foot.  The patient is a diabetic and has an insulin pump.  The patient currently have a psychiatrist that follows him outpatient and he is currently on Rexaulti 0.5 mg daily, Luvox 100 mg twice daily and trazodone 100 mg at bedtime.  The patient stated that his psychiatrist recommends that he began to see a therapist to help him with his depression.  He disclose that he is financially stricken and does not know if he is able to afford a therapist. The patient is married. The patient was seen face-to-face by this provider; chart reviewed and consulted with Dr. Alfred Levins on 12/24/2018 due to the care of the patient. It was discussed with both providers  that the patient does meet criteria to be admitted to the inpatient unit.  On evaluation the patient is alert and oriented x4, anxious, cooperative, and mood-congruent with affect.  The patient does not appear to be responding to internal or external stimuli. Neither is the patient presenting with any delusional thinking. The patient denies auditory or visual hallucinations. The patient voice suicidal ideation but denied denying absolutely that he is not, but homicidal ideations. The patient is not presenting with any psychotic or paranoid behaviors. During an encounter with the patient, he/she was able to answer questions appropriately.  Plan: The patient is a safety risk to self due to past attempt in 2014 with intentional ingestion of 30 Percocet which led him into a coma for 6 weeks and currently with a permament  tracheotomy.  Therefore, requiring psychiatric inpatient admission for stabilization and treatment.  HPI: Per Dr. Quentin Cornwall; [name] is a 58 y.o. male history of depression previous history of suicide attempt presents the ER for evaluation of 1 month of worsening depression feeling hopeless.  Denies any SI or HI.  States that he talked with his outpatient psychiatry today who recommended he come for inpatient evaluation.  States his been compliant with his medications.  No fevers.  States he does have a chronic cough.  Past Psychiatric History:  Depression Suicidal attempt ([place])  Risk to Self: Suicidal Ideation: Yes-Currently Present Suicidal Intent: No Is patient at risk for suicide?: Yes Suicidal Plan?: No What has been your use of drugs/alcohol within the last 12 months?:  no current use  How many times?: 1 Other Self Harm Risks: none Triggers for Past Attempts: Unknown Intentional Self Injurious Behavior: None Risk to Others: Homicidal Ideation: No Thoughts of Harm to Others: No Current Homicidal Intent: No Current Homicidal Plan: No Access to Homicidal Means:  No Identified Victim: none History of harm to others?: No Assessment of Violence: None Noted Violent Behavior Description: none Does patient have access to weapons?: No Criminal Charges Pending?: No Does patient have a court date: No Prior Inpatient Therapy: Prior Inpatient Therapy: Yes Prior Therapy Dates: 2018 Prior Therapy Facilty/Provider(s): [facility] Reason for Treatment: Depression  Prior Outpatient Therapy: Prior Outpatient Therapy: Yes Prior Therapy Dates: Current Prior Therapy Facilty/Provider(s): Dr. Sheppard Coil A. Daron Offer, MD Reason for Treatment: Depression Does patient have an ACCT team?: No Does patient have Intensive In-House Services?  : No Does patient have [facility] services? : No Does patient have P4CC services?: No  Past Medical History:  Past Medical History:  Diagnosis Date  . Depression   . Diabetes (HCC)    Insulin Pump  . Diabetes mellitus type I ([place])   . Diabetes mellitus without complication ([place])   . GERD (gastroesophageal reflux disease)   . H/O laryngectomy   . Heel bone fracture   . Hyperlipidemia   . Hypertension   . Radicular pain of right lower extremity   . Stroke ([place])   . Suicide attempt [facility]) 2014   damaged larynx - tracheostomy  . Thyroid disease     Past Surgical History:  Procedure Laterality Date  . COLONOSCOPY WITH PROPOFOL N/A 05/15/2018   Procedure: COLONOSCOPY WITH PROPOFOL;  Surgeon: [name], [name], MD;  Location: [facility] ENDOSCOPY;  Service: Gastroenterology;  Laterality: N/A;  . ESOPHAGOGASTRODUODENOSCOPY N/A 05/15/2018   Procedure: ESOPHAGOGASTRODUODENOSCOPY (EGD);  Surgeon: [name], [name], MD;  Location: [facility] ENDOSCOPY;  Service: Gastroenterology;  Laterality: N/A;  . FRACTURE SURGERY    . Heel bone reconstruction Left   . HERNIA REPAIR  02/1750   Umbilical hernia repair   . LARYNGECTOMY    . NECK SURGERY     fusion  . SPINE SURGERY    . TRACHEOSTOMY  2014   from SI attempt   Family History:  Family History  Problem  Relation Age of Onset  . Osteoporosis Mother   . Diabetes Mother   . Hypertension Father    Family Psychiatric  History: History reviewed. No pertinent family psychiatric history Social History:  Social History   Substance and Sexual Activity  Alcohol Use Yes  . Alcohol/week: 2.0 standard drinks  . Types: 2 Shots of liquor per week   Comment: rare     Social History   Substance and Sexual Activity  Drug Use No   Comment: Pt denied; UDS not available    Social History   Socioeconomic History  . Marital status: Married    Spouse name: Not on file  . Number of children: Not on file  . Years of education: Not on file  . Highest education level: Not on file  Occupational History  . Not on file  Social Needs  . Financial resource strain: Not on file  . Food insecurity    Worry: Not on file    Inability: Not on file  . Transportation needs    Medical: Not on file    Non-medical: Not on file  Tobacco Use  . Smoking status: Former Smoker    Packs/day: 0.00    Types: Cigarettes    Quit date: 04/09/2013  Years since quitting: 5.7  . Smokeless tobacco: Never Used  Substance and Sexual Activity  . Alcohol use: Yes    Alcohol/week: 2.0 standard drinks    Types: 2 Shots of liquor per week    Comment: rare  . Drug use: No    Comment: Pt denied; UDS not available  . Sexual activity: Yes    Partners: Female    Birth control/protection: Condom  Lifestyle  . Physical activity    Days per week: Not on file    Minutes per session: Not on file  . Stress: Not on file  Relationships  . Social Herbalist on phone: Not on file    Gets together: Not on file    Attends religious service: Not on file    Active member of club or organization: Not on file    Attends meetings of clubs or organizations: Not on file    Relationship status: Not on file  Other Topics Concern  . Not on file  Social History Narrative  . Not on file   Additional Social History:     Allergies:   Allergies  Allergen Reactions  . Buspar [Buspirone]     Makes the patient "flip out"  . Depakote [Valproic Acid]     Causes excessive drowsiness  . Clopidogrel Rash  . Gabapentin Itching and Rash    Labs:  Results for orders placed or performed during the hospital encounter of 12/23/18 (from the past 48 hour(s))  Glucose, capillary     Status: None   Collection Time: 12/23/18  5:04 PM  Result Value Ref Range   Glucose-Capillary 93 70 - 99 mg/dL  Comprehensive metabolic panel     Status: Abnormal   Collection Time: 12/23/18  5:16 PM  Result Value Ref Range   Sodium 137 135 - 145 mmol/L   Potassium 3.9 3.5 - 5.1 mmol/L   Chloride 106 98 - 111 mmol/L   CO2 22 22 - 32 mmol/L   Glucose, Bld 94 70 - 99 mg/dL   BUN 30 (H) 6 - 20 mg/dL   Creatinine, Ser 1.38 (H) 0.61 - 1.24 mg/dL   Calcium 9.7 8.9 - 10.3 mg/dL   Total Protein 7.3 6.5 - 8.1 g/dL   Albumin 3.8 3.5 - 5.0 g/dL   AST 14 (L) 15 - 41 U/L   ALT 17 0 - 44 U/L   Alkaline Phosphatase 111 38 - 126 U/L   Total Bilirubin 0.5 0.3 - 1.2 mg/dL   GFR calc non Af Amer 57 (L) >60 mL/min   GFR calc Af Amer >60 >60 mL/min   Anion gap 9 5 - 15    Comment: Performed at [facility], [address] [place], [place] [address]  Ethanol     Status: None   Collection Time: 12/23/18  5:16 PM  Result Value Ref Range   Alcohol, Ethyl (B) <10 <10 mg/dL    Comment: (NOTE) Lowest detectable limit for serum alcohol is 10 mg/dL. For medical purposes only. Performed at [facility], [address] [place], [place] [address]   Salicylate level     Status: None   Collection Time: 12/23/18  5:16 PM  Result Value Ref Range   Salicylate Lvl <2.6 2.8 - 30.0 mg/dL    Comment: Performed at [facility], [address] [place], [place] [address]  Acetaminophen level     Status: Abnormal   Collection Time: 12/23/18  5:16 PM  Result  Value Ref Range   Acetaminophen (Tylenol), Serum <10 (L) 10 - 30  ug/mL    Comment: (NOTE) Therapeutic concentrations vary significantly. A range of 10-30 ug/mL  may be an effective concentration for many patients. However, some  are best treated at concentrations outside of this range. Acetaminophen concentrations >150 ug/mL at 4 hours after ingestion  and >50 ug/mL at 12 hours after ingestion are often associated with  toxic reactions. Performed at [facility], [address] [place], [place] [address]   cbc     Status: None   Collection Time: 12/23/18  5:16 PM  Result Value Ref Range   WBC 8.3 4.0 - 10.5 K/uL   RBC 5.48 4.22 - 5.81 MIL/uL   Hemoglobin 16.2 13.0 - 17.0 g/dL   HCT 48.4 39.0 - 52.0 %   MCV 88.3 80.0 - 100.0 fL   MCH 29.6 26.0 - 34.0 pg   MCHC 33.5 30.0 - 36.0 g/dL   RDW 13.9 11.5 - 15.5 %   Platelets 293 150 - 400 K/uL   nRBC 0.0 0.0 - 0.2 %    Comment: Performed at [facility], [address], [place], [place] [address]  Urine Drug Screen, Qualitative     Status: None   Collection Time: 12/23/18  5:16 PM  Result Value Ref Range   Tricyclic, Ur Screen NONE DETECTED NONE DETECTED   Amphetamines, Ur Screen NONE DETECTED NONE DETECTED   MDMA (Ecstasy)Ur Screen NONE DETECTED NONE DETECTED   Cocaine Metabolite,Ur [place] NONE DETECTED NONE DETECTED   Opiate, Ur Screen NONE DETECTED NONE DETECTED   Phencyclidine (PCP) Ur S NONE DETECTED NONE DETECTED   Cannabinoid 50 Ng, Ur  NONE DETECTED NONE DETECTED   Barbiturates, Ur Screen NONE DETECTED NONE DETECTED   Benzodiazepine, Ur Scrn NONE DETECTED NONE DETECTED   Methadone Scn, Ur NONE DETECTED NONE DETECTED    Comment: (NOTE) Tricyclics + metabolites, urine    Cutoff 1000 ng/mL Amphetamines + metabolites, urine  Cutoff 1000 ng/mL MDMA (Ecstasy), urine              Cutoff 500 ng/mL Cocaine Metabolite, urine          Cutoff 300 ng/mL Opiate + metabolites, urine        Cutoff 300 ng/mL Phencyclidine (PCP), urine         Cutoff 25 ng/mL Cannabinoid, urine                  Cutoff 50 ng/mL Barbiturates + metabolites, urine  Cutoff 200 ng/mL Benzodiazepine, urine              Cutoff 200 ng/mL Methadone, urine                   Cutoff 300 ng/mL The urine drug screen provides only a preliminary, unconfirmed analytical test result and should not be used for non-medical purposes. Clinical consideration and professional judgment should be applied to any positive drug screen result due to possible interfering substances. A more specific alternate chemical method must be used in order to obtain a confirmed analytical result. Gas chromatography / mass spectrometry (GC/MS) is the preferred confirmat ory method. Performed at [facility], [address] [place], [place] [address]   Hemoglobin A1c     Status: Abnormal   Collection Time: 12/23/18  5:16 PM  Result Value Ref Range   Hgb A1c MFr Bld 7.7 (H) 4.8 - 5.6 %    Comment: (NOTE) Pre diabetes:  5.7%-6.4% Diabetes:              >6.4% Glycemic control for   <7.0% adults with diabetes    Mean Plasma Glucose 174.29 mg/dL    Comment: Performed at [place] [address] [place], [place] [address]  SARS Coronavirus 2 [facility] order, Performed in [facility] hospital lab) Nasopharyngeal Nasopharyngeal Swab     Status: None   Collection Time: 12/23/18 10:46 PM   Specimen: Nasopharyngeal Swab  Result Value Ref Range   SARS Coronavirus 2 NEGATIVE NEGATIVE    Comment: (NOTE) If result is NEGATIVE SARS-CoV-2 target nucleic acids are NOT DETECTED. The SARS-CoV-2 RNA is generally detectable in upper and lower  respiratory specimens during the acute phase of infection. The lowest  concentration of SARS-CoV-2 viral copies this assay can detect is 250  copies / mL. A negative result does not preclude SARS-CoV-2 infection  and should not be used as the sole basis for treatment or other  patient management decisions.  A negative result may occur with  improper specimen collection  / handling, submission of specimen other  than nasopharyngeal swab, presence of viral mutation(s) within the  areas targeted by this assay, and inadequate number of viral copies  (<250 copies / mL). A negative result must be combined with clinical  observations, patient history, and epidemiological information. If result is POSITIVE SARS-CoV-2 target nucleic acids are DETECTED. The SARS-CoV-2 RNA is generally detectable in upper and lower  respiratory specimens dur ing the acute phase of infection.  Positive  results are indicative of active infection with SARS-CoV-2.  Clinical  correlation with patient history and other diagnostic information is  necessary to determine patient infection status.  Positive results do  not rule out bacterial infection or co-infection with other viruses. If result is PRESUMPTIVE POSTIVE SARS-CoV-2 nucleic acids MAY BE PRESENT.   A presumptive positive result was obtained on the submitted specimen  and confirmed on repeat testing.  While 2019 novel coronavirus  (SARS-CoV-2) nucleic acids may be present in the submitted sample  additional confirmatory testing may be necessary for epidemiological  and / or clinical management purposes  to differentiate between  SARS-CoV-2 and other Sarbecovirus currently known to infect humans.  If clinically indicated additional testing with an alternate test  methodology [phone]) is advised. The SARS-CoV-2 RNA is generally  detectable in upper and lower respiratory sp ecimens during the acute  phase of infection. The expected result is Negative. Fact Sheet for Patients:  StrictlyIdeas.no Fact Sheet for Healthcare Providers: BankingDealers.co.za This test is not yet approved or cleared by the [place] FDA and has been authorized for detection and/or diagnosis of SARS-CoV-2 by FDA under an Emergency Use Authorization (EUA).  This EUA will remain in effect (meaning this  test can be used) for the duration of the COVID-19 declaration under Section 564(b)(1) of the Act, 21 U.S.C. section 360bbb-3(b)(1), unless the authorization is terminated or revoked sooner. Performed at [facility], [address] [place],  [address]   Glucose, capillary     Status: Abnormal   Collection Time: 12/24/18 12:40 AM  Result Value Ref Range   Glucose-Capillary 155 (H) 70 - 99 mg/dL    Current Facility-Administered Medications  Medication Dose Route Frequency Provider Last Rate Last Dose  . acetylcysteine (MUCOMYST) 20 % nebulizer / oral solution 4 mL  4 mL Nebulization Q6H [place], [place], MD      . amLODipine (NORVASC) tablet 5 mg  5 mg  Oral Daily Alfred Levins, [place], MD      . aspirin EC tablet 81 mg  81 mg Oral Daily Alfred Levins, [place], MD      . Brexpiprazole TABS 0.5 mg  0.5 mg Oral Daily Alfred Levins, [place], MD      . fluticasone [facility]) 50 MCG/ACT nasal spray 2 spray  2 spray Each Nare Daily Alfred Levins, [place], MD      . fluvoxaMINE (LUVOX) tablet 200 mg  200 mg Oral QHS Alfred Levins, [place], MD      . losartan (COZAAR) tablet 25 mg  25 mg Oral Daily Hall, Scott A, RPH       And  . hydrochlorothiazide (HYDRODIURIL) tablet 25 mg  25 mg Oral Daily Hart Robinsons A, RPH      . insulin aspart (novoLOG) injection 0-15 Units  0-15 Units Subcutaneous TID WC Alfred Levins, [place], MD      . insulin aspart (novoLOG) injection 0-5 Units  0-5 Units Subcutaneous QHS Alfred Levins, [place], MD      . levothyroxine (SYNTHROID) tablet 75 mcg  75 mcg Oral QAC breakfast Alfred Levins, [place], MD      . multivitamin with minerals tablet 1 tablet  1 tablet Oral Daily Alfred Levins, [place], MD      . pantoprazole (PROTONIX) EC tablet 40 mg  40 mg Oral BID Alfred Levins, [place], MD   40 mg at 12/24/18 0257  . simvastatin (ZOCOR) tablet 20 mg  20 mg Oral QPM Alfred Levins, [place], MD      . traMADol Veatrice Bourbon) tablet 100 mg  100 mg Oral BID Alfred Levins, [place], MD      . traZODone (DESYREL)  tablet 100 mg  100 mg Oral QHS Alfred Levins, [place], MD   100 mg at 12/24/18 7026   Current Outpatient Medications  Medication Sig Dispense Refill  . acetylcysteine (MUCOMYST) 20 % nebulizer solution Take 4 mLs by nebulization every 6 (six) hours.    Marland Kitchen amLODipine (NORVASC) 5 MG tablet Take 5 mg by mouth daily.     Marland Kitchen aspirin EC 81 MG tablet Take 1 tablet by mouth daily.    . Brexpiprazole (REXULTI) 0.5 MG TABS Take 0.5 mg by mouth daily.    . empagliflozin (JARDIANCE) 25 MG TABS tablet Take 25 mg by mouth daily.    . fluticasone (FLONASE) 50 MCG/ACT nasal spray Place 2 sprays into both nostrils daily.    . fluvoxaMINE (LUVOX) 100 MG tablet Take 2 tablets (200 mg total) by mouth at bedtime. 180 tablet 1  . insulin aspart (NOVOLOG) 100 UNIT/ML injection Inject 1.15-1.25 Units into the skin See admin instructions. Via insulin pump-basal rate: 1.15-1.25 units/hr (every 8 carbs = 1 unit of insulin bolus) 0000-0800 = 1.15 units/hr and 0801/2359 = 1.25 units/hr    . levothyroxine (SYNTHROID, LEVOTHROID) 75 MCG tablet Take 75 mcg by mouth daily before breakfast.    . losartan-hydrochlorothiazide (HYZAAR) 100-25 MG tablet Take 1 tablet by mouth daily.     . pantoprazole (PROTONIX) 40 MG tablet Take 1 tablet by mouth 2 (two) times daily. Taking one tablet daily    . simvastatin (ZOCOR) 20 MG tablet Take 1 tablet by mouth every evening.     . traMADol (ULTRAM) 50 MG tablet Take 100 mg by mouth 2 (two) times daily.    . traZODone (DESYREL) 100 MG tablet Take 100 mg by mouth at bedtime.    . Multiple Vitamin (MULTIVITAMIN) tablet Take 1 tablet by mouth daily.    . vardenafil (LEVITRA) 20 MG tablet Take 20 mg by mouth  daily as needed for erectile dysfunction.      Musculoskeletal: Strength & Muscle Tone: within normal limits Gait & Station: unsteady Patient leans: N/A  Psychiatric Specialty Exam: Physical Exam  Nursing note and vitals reviewed. Constitutional: He is oriented to person, place, and time.  He appears well-developed.  HENT:  Head: Normocephalic.  Eyes: Pupils are equal, round, and reactive to light.  Neck: Normal range of motion. Neck supple.  Cardiovascular: Normal rate.  Respiratory: Effort normal.  Musculoskeletal: Normal range of motion.  Neurological: He is alert and oriented to person, place, and time.  Skin: Skin is warm and dry.    Review of Systems  Psychiatric/Behavioral: Positive for depression.  All other systems reviewed and are negative.   Blood pressure (!) 154/105, pulse 74, resp. rate (!) 22, height 6' (1.829 m), weight 116.1 kg, SpO2 96 %.Body mass index is 34.72 kg/m.  General Appearance: Casual  Eye Contact:  Good  Speech:  Garbled and Due to his tracheotomy  Volume:  Decreased  Mood:  Anxious and Depressed  Affect:  Blunt, Congruent, Depressed and Flat  Thought Process:  Coherent  Orientation:  Full (Time, Place, and Person)  Thought Content:  Logical  Suicidal Thoughts:  Yes.  without intent/plan  Homicidal Thoughts:  No  Memory:  Immediate;   Good Recent;   Good Remote;   Good  Judgement:  Poor  Insight:  Lacking  Psychomotor Activity:  Decreased  Concentration:  Concentration: Good and Attention Span: Good  Recall:  Good  Fund of Knowledge:  Good  Language:  Good  Akathisia:  Negative  Handed:  Right  AIMS (if indicated):     Assets:  Communication Skills Desire for Improvement Financial Resources/Insurance Physical Health Social Support  ADL's:  Impaired  Cognition:  WNL  Sleep:        Treatment Plan Summary: Medication management  The patient is a safety risk to self and currently is requiring psychiatric inpatient admission for stabilization and treatment.  Disposition: Recommend psychiatric Inpatient admission when medically cleared. Supportive therapy provided about ongoing stressors.  [name], NP 12/24/2018 6:17 AM

## 2018-12-24 NOTE — Discharge Instructions (Addendum)
Return to the ER for any new or worsening symptoms and including worsening depression, any thoughts of wanting to hurt yourself or anyone else, or any other new or worsening symptoms that concern you.

## 2018-12-24 NOTE — ED Notes (Signed)
PT manually suctions trach independently

## 2018-12-24 NOTE — ED Notes (Signed)
Mother called and is bringing abx for "lung infection" to the ED. Pt suctioning trach.

## 2018-12-24 NOTE — ED Notes (Signed)
DUPLICATE ORDERS FOR MEAL COVERAGE AND BEDTIME INSULIN, 1 OF THE 2 DUPLICATE ORDERS D/C AFTER CONSULTATION FROM MD

## 2018-12-24 NOTE — BH Assessment (Signed)
*  BED HAS BEEN CANCELED DUE TO MEDICAL - Per accepting facility.

## 2018-12-24 NOTE — Progress Notes (Addendum)
Inpatient Diabetes Program Recommendations  AACE/ADA: New Consensus Statement on Inpatient Glycemic Control (2015)  Target Ranges:  Prepandial:   less than 140 mg/dL      Peak postprandial:   less than 180 mg/dL (1-2 hours)      Critically ill patients:  140 - 180 mg/dL   Results for [name], [name] (MRN [id number]) as of 12/24/2018 08:39  Ref. Range 12/23/2018 17:04 12/24/2018 00:40 12/24/2018 07:52  Glucose-Capillary Latest Ref Range: 70 - 99 mg/dL 93 155 (H) 127 (H)    To ED with Depression and Suicidal Thoughts  History: Type 1 Diabetes, Previous Suicide Attempt, Tracheotomy  Home DM Meds: Insulin Pump (see below for latest settings)       Jardiance 25 mg Daily  Current Orders: Novolog Moderate Correction Scale/ SSI (0-15 units) TID AC + HS      Endocrinologist: Dr. Honor Junes with [facility]--last seen 11/14/2018--Insulin Pump basal rate was adjusted at this visit.  At that visit in July, pump settings were as follows: Basal rates: 12am- 1.175 units/hr 12pm- 1.35 units/hr Total basal insulin per 24 hour period= 30.3 units Carb Ratio- 1 unit for every 5 grams Carbohydrates Correction Ratio- 1 unit for every 20 mg/dl above target CBG Target CBG 110 mg/dl     MD- Per hospital policy (insulin pump policy), patient cannot use insulin pump in the hospital since he was admitted with Suicidal Thoughts.    We need to have pt remove the Insulin Pump and place orders for Basal Insulin + Bolus Insulin.  Please consider the following:  1. Have patient remove Insulin Pump   2. Immediately administer Lantus insulin: Lantus 25 units Daily (80% total amount basal insulin pt gets through his pump every 24 hours)   3. Continue Novolog Moderate (0-15 units) SSI TID AC + HS   4. Start Novolog Meal Coverage: Novolog 6 units TID with meals  (Please add the following Hold Parameters: Hold if pt eats <50% of meal, Hold if pt NPO)      Addendum 11am- Spoke with Kirke Shaggy, RN by  phone.  Per RN, pt's Insulin pump is off.  Explained to RN that patient will need basal insulin and meal coverage insulin while off his insulin pump.  Dahlia Client I left a note in the progress notes with insulin recommendations for Lantus and Novolog meal Coverage.  Asked Kirke Shaggy to please alert ED MD that I left recs and to please consider ordering Lantus and Novolog Meal Coverage as requested in my recs.       --Will follow patient during hospitalization--  Wyn Quaker RN, MSN, CDE Diabetes Coordinator Inpatient Glycemic Control Team Team Pager: [phone] (8a-5p)

## 2018-12-24 NOTE — ED Notes (Signed)
ATTEMPTED TO CALL REPORT TO ACCEPTING FACILITY. NO ANSWER FROM FACILITY

## 2018-12-24 NOTE — ED Notes (Signed)
[name] with TTS to let her know that pt refuses to be admitted anywhere other than [facility]

## 2018-12-24 NOTE — ED Notes (Signed)
Mother updated on plan to transport pt to accepting facility.

## 2018-12-24 NOTE — BH Assessment (Addendum)
*  BED HAS BEEN CANCELED DUE TO MEDICAL - Per accepting facility.

## 2018-12-24 NOTE — ED Notes (Addendum)
Spoke with charge nurse [facility] who states they may accept pt if they can find appropriate equipment for trach care

## 2018-12-24 NOTE — ED Notes (Signed)
[facility]  CALLED  FOR  TRANSPORT

## 2018-12-24 NOTE — ED Notes (Signed)
[facility]  CNL  PER  KAILEY  RN

## 2018-12-24 NOTE — BH Assessment (Addendum)
Referral information for Psychiatric Hospitalization faxed to;    [place] [phone]- [phone])     [facility] [phone]): [place] [phone])    [facility]  . [facility] [phone])   . [facility] [phone] -[phone]): Denied due to medical acuity  . [place] [phone])  . [place] [phone])    [facility]  . [address] [phone])   . [facility] [phone] or [phone])  . [place] [phone] or [phone])  . [facility] [phone])  . [facility] [phone])

## 2018-12-24 NOTE — ED Provider Notes (Signed)
-----------------------------------------   5:01 PM on 12/24/2018 -----------------------------------------  Per NP Marvia Pickles from psychiatry, the patient has been cleared for discharge.  He was initially going to be discharged to a facility although apparently no facility found that would be willing to take him with his tracheostomy.  Per psychiatry, the patient is stable psychiatrically and does not require inpatient admission.  The multiple family members have been contacted and agree with the plan.  The patient is not under commitment.  Medically he is also stable for discharge home.  Return precautions have been provided.   Arta Silence, MD 12/24/18 [phone]

## 2018-12-24 NOTE — BH Assessment (Addendum)
Assessment Note  [name] is an 58 y.o. male. who presents the ER for evaluation of 1 month of worsening depression feeling hopeless. Patient reports a progression in depressive symptoms. He shares that he is currently active in outpatient medication management. Following his visits today his psychiatrist requested that he come in for further evaluation and  recommended outpatient psychotherapy. He reports that he is compliant with his medications although he also endorses suicidal ideations, with no plan. Patient states " sometimes I'd rather be dead than alive." He reports having support from his mother and spouse. Client denies the use of any mood altering substances or illicit drugs.  Pt. denies the presence of any auditory or visual hallucinations at this time. Patient denies any other medical complaints. He reports one previous suicide attempt in 2014. At that point in time patient attempted to overdose. He reports difficulty obtaining restful sleep. A behavioral health assessment has been completed including evaluation of the patient, collecting collateral history:, reviewing available medical/clinic records, evaluating his unique risk and protective factors, and discussing treatment recommendations.     Diagnosis: Major Depressive Disorder  Past Medical History:  Past Medical History:  Diagnosis Date  . Depression   . Diabetes (HCC)    Insulin Pump  . Diabetes mellitus type I ([name])   . Diabetes mellitus without complication ([name])   . GERD (gastroesophageal reflux disease)   . H/O laryngectomy   . Heel bone fracture   . Hyperlipidemia   . Hypertension   . Radicular pain of right lower extremity   . Stroke ([name])   . Suicide attempt [name]) 2014   damaged larynx - tracheostomy  . Thyroid disease     Past Surgical History:  Procedure Laterality Date  . COLONOSCOPY WITH PROPOFOL N/A 05/15/2018   Procedure: COLONOSCOPY WITH PROPOFOL;  Surgeon: Holder, Joel Pike, MD;  Location:  [facility] ENDOSCOPY;  Service: Gastroenterology;  Laterality: N/A;  . ESOPHAGOGASTRODUODENOSCOPY N/A 05/15/2018   Procedure: ESOPHAGOGASTRODUODENOSCOPY (EGD);  Surgeon: Holder, Joel Pike, MD;  Location: [facility] ENDOSCOPY;  Service: Gastroenterology;  Laterality: N/A;  . FRACTURE SURGERY    . Heel bone reconstruction Left   . HERNIA REPAIR  26/7124   Umbilical hernia repair   . LARYNGECTOMY    . NECK SURGERY     fusion  . SPINE SURGERY    . TRACHEOSTOMY  2014   from SI attempt    Family History:  Family History  Problem Relation Age of Onset  . Osteoporosis Mother   . Diabetes Mother   . Hypertension Father     Social History:  reports that he quit smoking about 5 years ago. His smoking use included cigarettes. He smoked 0.00 packs per day. He has never used smokeless tobacco. He reports current alcohol use of about 2.0 standard drinks of alcohol per week. He reports that he does not use drugs.  Additional Social History:  Alcohol / Drug Use Pain Medications: See MAR Prescriptions: See MAR Over the Counter: See MAR History of alcohol / drug use?: No history of alcohol / drug abuse  CIWA: CIWA-Ar BP: (!) 154/105 Pulse Rate: 74 COWS:    Allergies:  Allergies  Allergen Reactions  . Buspar [Buspirone]     Makes the patient "flip out"  . Depakote [Valproic Acid]     Causes excessive drowsiness  . Clopidogrel Rash  . Gabapentin Itching and Rash    Home Medications: (Not in a hospital admission)   OB/GYN Status:  No LMP for male  patient.  General Assessment Data Location of Assessment: [facility] ED TTS Assessment: In system Is this a Tele or Face-to-Face Assessment?: Tele Assessment Is this an Initial Assessment or a Re-assessment for this encounter?: Initial Assessment Patient Accompanied by:: N/A Living Arrangements: Other (Comment) What gender do you identify as?: Male Living Arrangements: Spouse/significant other Can pt return to current living arrangement?: Yes Admission  Status: Voluntary Is patient capable of signing voluntary admission?: Yes Referral Source: Self/Family/Friend Insurance type: HealthTeam  Medical Screening Exam ([place]) Medical Exam completed: Yes  Crisis Care Plan Living Arrangements: Spouse/significant other Legal Guardian: Other:(Self ) Name of Psychiatrist: Dr. Sheppard Coil A. Daron Offer, MD Name of Therapist: none  Education Status Is patient currently in school?: No Is the patient employed, unemployed or receiving disability?: Receiving disability income  Risk to self with the past 6 months Suicidal Ideation: Yes-Currently Present Has patient been a risk to self within the past 6 months prior to admission? : Yes Suicidal Intent: No Has patient had any suicidal intent within the past 6 months prior to admission? : No Is patient at risk for suicide?: Yes Suicidal Plan?: No Has patient had any suicidal plan within the past 6 months prior to admission? : No What has been your use of drugs/alcohol within the last 12 months?: no current use  Previous Attempts/Gestures: Yes How many times?: 1 Other Self Harm Risks: none Triggers for Past Attempts: Unknown Intentional Self Injurious Behavior: None Family Suicide History: No Recent stressful life event(s): Recent negative physical changes Persecutory voices/beliefs?: No Depression: Yes Depression Symptoms: Feeling angry/irritable, Loss of interest in usual pleasures, Feeling worthless/self pity, Despondent Substance abuse history and/or treatment for substance abuse?: No Suicide prevention information given to non-admitted patients: Yes  Risk to Others within the past 6 months Homicidal Ideation: No Does patient have any lifetime risk of violence toward others beyond the six months prior to admission? : No Thoughts of Harm to Others: No Current Homicidal Intent: No Current Homicidal Plan: No Access to Homicidal Means: No Identified Victim: none History of harm to  others?: No Assessment of Violence: None Noted Violent Behavior Description: none Does patient have access to weapons?: No Criminal Charges Pending?: No Does patient have a court date: No Is patient on probation?: No  Psychosis Hallucinations: None noted Delusions: None noted  Mental Status Report Appearance/Hygiene: In scrubs Eye Contact: Fair Motor Activity: Freedom of movement Speech: Logical/coherent Level of Consciousness: Alert Mood: Depressed Affect: Depressed Anxiety Level: Moderate Thought Processes: Relevant Judgement: Partial Orientation: Situation, Time, Place, Person Obsessive Compulsive Thoughts/Behaviors: None  Cognitive Functioning Concentration: Fair Memory: Remote Intact, Recent Intact Is patient IDD: No Insight: Fair Impulse Control: Fair Appetite: Fair Have you had any weight changes? : No Change Sleep: Decreased Total Hours of Sleep: 5 Vegetative Symptoms: None  ADLScreening [facility] Assessment Services) Patient's cognitive ability adequate to safely complete daily activities?: Yes Patient able to express need for assistance with ADLs?: Yes Independently performs ADLs?: Yes (appropriate for developmental age)  Prior Inpatient Therapy Prior Inpatient Therapy: Yes Prior Therapy Dates: 2018 Prior Therapy Facilty/Provider(s): [facility] Reason for Treatment: Depression   Prior Outpatient Therapy Prior Outpatient Therapy: Yes Prior Therapy Dates: Current Prior Therapy Facilty/Provider(s): Dr. Sheppard Coil A. Eksir, MD Reason for Treatment: Depression Does patient have an ACCT team?: No Does patient have Intensive In-House Services?  : No Does patient have [facility] services? : No Does patient have P4CC services?: No  ADL Screening (condition at time of admission) Patient's cognitive ability adequate to safely  complete daily activities?: Yes Patient able to express need for assistance with ADLs?: Yes Independently performs ADLs?: Yes (appropriate for  developmental age)       Abuse/Neglect Assessment (Assessment to be complete while patient is alone) Abuse/Neglect Assessment Can Be Completed: Yes Physical Abuse: Denies Verbal Abuse: Denies Sexual Abuse: Denies Exploitation of patient/patient's resources: Denies Self-Neglect: Denies Values / Beliefs Cultural Requests During Hospitalization: None Spiritual Requests During Hospitalization: None Consults Spiritual Care Consult Needed: No Social Work Consult Needed: No Regulatory affairs officer (For Healthcare) Does Patient Have a Medical Advance Directive?: No Would patient like information on creating a medical advance directive?: No - Patient declined          Disposition:  Disposition Initial Assessment Completed for this Encounter: Yes  On Site Evaluation by:   Reviewed with Physician:    Laretta Alstrom 12/24/2018 12:41 AM

## 2018-12-24 NOTE — ED Notes (Signed)
Accepting facility states they can not accept pt due to pt having a trach

## 2018-12-24 NOTE — ED Notes (Signed)
Psychiatric PA verbally verified that pt contracted for safety.

## 2018-12-24 NOTE — ED Notes (Signed)
Referral information for Psychiatric Hospitalization faxed to;   [facility] [phone]) . [facility] [phone] -or- [phone]) [phone]  . [facility] [phone]),   . [facility] [phone]),   . [facility] [phone] -or- [phone]),   . [facility] [phone] or [phone])  . [place] [phone] or [phone]),   . [facility] [phone]).  [facility] [phone])

## 2018-12-25 DIAGNOSIS — Z930 Tracheostomy status: Secondary | ICD-10-CM | POA: Diagnosis not present

## 2018-12-25 DIAGNOSIS — Z430 Encounter for attention to tracheostomy: Secondary | ICD-10-CM | POA: Diagnosis not present

## 2018-12-30 DIAGNOSIS — S8264XA Nondisplaced fracture of lateral malleolus of right fibula, initial encounter for closed fracture: Secondary | ICD-10-CM | POA: Diagnosis not present

## 2018-12-31 ENCOUNTER — Inpatient Hospital Stay
Admission: RE | Admit: 2018-12-31 | Discharge: 2019-01-07 | DRG: 885 | Disposition: A | Discharge status: Home / Self Care | Payer: PPO | Source: Intra-hospital | Attending: Psychiatry | Admitting: Psychiatry

## 2018-12-31 ENCOUNTER — Other Ambulatory Visit: Payer: Self-pay

## 2018-12-31 ENCOUNTER — Encounter: Payer: Self-pay | Admitting: Emergency Medicine

## 2018-12-31 ENCOUNTER — Emergency Department: Payer: PPO

## 2018-12-31 ENCOUNTER — Emergency Department
Admission: EM | Admit: 2018-12-31 | Discharge: 2018-12-31 | Disposition: A | Discharge status: Other Acute Inpatient Hospital | Payer: PPO | Attending: Student | Admitting: Student

## 2018-12-31 DIAGNOSIS — K219 Gastro-esophageal reflux disease without esophagitis: Secondary | ICD-10-CM | POA: Diagnosis present

## 2018-12-31 DIAGNOSIS — T520X2S Toxic effect of petroleum products, intentional self-harm, sequela: Secondary | ICD-10-CM

## 2018-12-31 DIAGNOSIS — Z03818 Encounter for observation for suspected exposure to other biological agents ruled out: Secondary | ICD-10-CM | POA: Diagnosis not present

## 2018-12-31 DIAGNOSIS — E039 Hypothyroidism, unspecified: Secondary | ICD-10-CM | POA: Diagnosis present

## 2018-12-31 DIAGNOSIS — R45851 Suicidal ideations: Secondary | ICD-10-CM | POA: Diagnosis not present

## 2018-12-31 DIAGNOSIS — Y929 Unspecified place or not applicable: Secondary | ICD-10-CM | POA: Diagnosis not present

## 2018-12-31 DIAGNOSIS — F3130 Bipolar disorder, current episode depressed, mild or moderate severity, unspecified: Secondary | ICD-10-CM | POA: Diagnosis not present

## 2018-12-31 DIAGNOSIS — F333 Major depressive disorder, recurrent, severe with psychotic symptoms: Secondary | ICD-10-CM | POA: Insufficient documentation

## 2018-12-31 DIAGNOSIS — E1065 Type 1 diabetes mellitus with hyperglycemia: Secondary | ICD-10-CM | POA: Diagnosis present

## 2018-12-31 DIAGNOSIS — F29 Unspecified psychosis not due to a substance or known physiological condition: Secondary | ICD-10-CM | POA: Diagnosis not present

## 2018-12-31 DIAGNOSIS — E109 Type 1 diabetes mellitus without complications: Secondary | ICD-10-CM | POA: Insufficient documentation

## 2018-12-31 DIAGNOSIS — E785 Hyperlipidemia, unspecified: Secondary | ICD-10-CM | POA: Diagnosis not present

## 2018-12-31 DIAGNOSIS — T189XXA Foreign body of alimentary tract, part unspecified, initial encounter: Secondary | ICD-10-CM | POA: Diagnosis not present

## 2018-12-31 DIAGNOSIS — Z79899 Other long term (current) drug therapy: Secondary | ICD-10-CM | POA: Diagnosis not present

## 2018-12-31 DIAGNOSIS — Z20828 Contact with and (suspected) exposure to other viral communicable diseases: Secondary | ICD-10-CM | POA: Diagnosis not present

## 2018-12-31 DIAGNOSIS — Z794 Long term (current) use of insulin: Secondary | ICD-10-CM | POA: Diagnosis not present

## 2018-12-31 DIAGNOSIS — I10 Essential (primary) hypertension: Secondary | ICD-10-CM | POA: Insufficient documentation

## 2018-12-31 DIAGNOSIS — Y939 Activity, unspecified: Secondary | ICD-10-CM | POA: Diagnosis not present

## 2018-12-31 DIAGNOSIS — T1491XA Suicide attempt, initial encounter: Secondary | ICD-10-CM | POA: Diagnosis not present

## 2018-12-31 DIAGNOSIS — Z8673 Personal history of transient ischemic attack (TIA), and cerebral infarction without residual deficits: Secondary | ICD-10-CM | POA: Diagnosis not present

## 2018-12-31 DIAGNOSIS — J9811 Atelectasis: Secondary | ICD-10-CM | POA: Diagnosis not present

## 2018-12-31 DIAGNOSIS — Z930 Tracheostomy status: Secondary | ICD-10-CM

## 2018-12-31 DIAGNOSIS — Y999 Unspecified external cause status: Secondary | ICD-10-CM | POA: Diagnosis not present

## 2018-12-31 DIAGNOSIS — Z7982 Long term (current) use of aspirin: Secondary | ICD-10-CM | POA: Diagnosis not present

## 2018-12-31 DIAGNOSIS — X58XXXA Exposure to other specified factors, initial encounter: Secondary | ICD-10-CM | POA: Insufficient documentation

## 2018-12-31 DIAGNOSIS — T520X1A Toxic effect of petroleum products, accidental (unintentional), initial encounter: Secondary | ICD-10-CM | POA: Diagnosis not present

## 2018-12-31 DIAGNOSIS — F332 Major depressive disorder, recurrent severe without psychotic features: Secondary | ICD-10-CM | POA: Diagnosis present

## 2018-12-31 DIAGNOSIS — Z87891 Personal history of nicotine dependence: Secondary | ICD-10-CM

## 2018-12-31 DIAGNOSIS — R0689 Other abnormalities of breathing: Secondary | ICD-10-CM | POA: Diagnosis not present

## 2018-12-31 LAB — COMPREHENSIVE METABOLIC PANEL
ALT: 15 U/L (ref 0–44)
AST: 21 U/L (ref 15–41)
Albumin: 3.9 g/dL (ref 3.5–5.0)
Alkaline Phosphatase: 110 U/L (ref 38–126)
Anion gap: 14 (ref 5–15)
BUN: 26 mg/dL — ABNORMAL HIGH (ref 6–20)
CO2: 20 mmol/L — ABNORMAL LOW (ref 22–32)
Calcium: 9.5 mg/dL (ref 8.9–10.3)
Chloride: 105 mmol/L (ref 98–111)
Creatinine, Ser: 1.59 mg/dL — ABNORMAL HIGH (ref 0.61–1.24)
GFR calc Af Amer: 55 mL/min — ABNORMAL LOW (ref >60)
GFR calc non Af Amer: 48 mL/min — ABNORMAL LOW (ref >60)
Glucose, Bld: 199 mg/dL — ABNORMAL HIGH (ref 70–99)
Potassium: 4.1 mmol/L (ref 3.5–5.1)
Sodium: 139 mmol/L (ref 135–145)
Total Bilirubin: 1.2 mg/dL (ref 0.3–1.2)
Total Protein: 7.2 g/dL (ref 6.5–8.1)

## 2018-12-31 LAB — URINE DRUG SCREEN, QUALITATIVE (ARMC ONLY)
Amphetamines, Ur Screen: NOT DETECTED
Barbiturates, Ur Screen: NOT DETECTED
Benzodiazepine, Ur Scrn: NOT DETECTED
Cannabinoid 50 Ng, Ur ~~LOC~~: NOT DETECTED
Cocaine Metabolite,Ur ~~LOC~~: NOT DETECTED
MDMA (Ecstasy)Ur Screen: NOT DETECTED
Methadone Scn, Ur: NOT DETECTED
Opiate, Ur Screen: NOT DETECTED
Phencyclidine (PCP) Ur S: NOT DETECTED
Tricyclic, Ur Screen: NOT DETECTED

## 2018-12-31 LAB — GLUCOSE, CAPILLARY
Glucose-Capillary: 145 mg/dL — ABNORMAL HIGH (ref 70–99)
Glucose-Capillary: 53 mg/dL — ABNORMAL LOW (ref 70–99)
Glucose-Capillary: 57 mg/dL — ABNORMAL LOW (ref 70–99)
Glucose-Capillary: 87 mg/dL (ref 70–99)

## 2018-12-31 LAB — SALICYLATE LEVEL: Salicylate Lvl: <7 mg/dL (ref 2.8–30.0)

## 2018-12-31 LAB — CBC
HCT: 48.4 % (ref 39.0–52.0)
Hemoglobin: 16.3 g/dL (ref 13.0–17.0)
MCH: 29.5 pg (ref 26.0–34.0)
MCHC: 33.7 g/dL (ref 30.0–36.0)
MCV: 87.7 fL (ref 80.0–100.0)
Platelets: 303 10*3/uL (ref 150–400)
RBC: 5.52 MIL/uL (ref 4.22–5.81)
RDW: 13.9 % (ref 11.5–15.5)
WBC: 9.7 10*3/uL (ref 4.0–10.5)
nRBC: 0 % (ref 0.0–0.2)

## 2018-12-31 LAB — ACETAMINOPHEN LEVEL: Acetaminophen (Tylenol), Serum: <10 ug/mL — ABNORMAL LOW (ref 10–30)

## 2018-12-31 LAB — ETHANOL: Alcohol, Ethyl (B): <10 mg/dL (ref <10)

## 2018-12-31 MED ORDER — LORAZEPAM 1 MG PO TABS
1.0000 mg | ORAL_TABLET | Freq: Once | ORAL | Status: AC
  Administered 2018-12-31: 17:00:00 1 mg via ORAL
  Filled 2018-12-31: qty 1

## 2018-12-31 MED ORDER — SIMVASTATIN 10 MG PO TABS
20.0000 mg | ORAL_TABLET | Freq: Every evening | ORAL | Status: DC
  Administered 2018-12-31: 20 mg via ORAL
  Filled 2018-12-31: qty 2

## 2018-12-31 MED ORDER — IBUPROFEN 600 MG PO TABS
600.0000 mg | ORAL_TABLET | Freq: Once | ORAL | Status: AC
  Administered 2018-12-31: 18:00:00 600 mg via ORAL
  Filled 2018-12-31: qty 1

## 2018-12-31 MED ORDER — INSULIN ASPART 100 UNIT/ML ~~LOC~~ SOLN: 0.0000 [IU] | Freq: Three times a day (TID) | SUBCUTANEOUS | Status: DC

## 2018-12-31 MED ORDER — LEVOTHYROXINE SODIUM 50 MCG PO TABS
75.0000 ug | ORAL_TABLET | Freq: Every day | ORAL | Status: DC
  Administered 2018-12-31: 21:00:00 75 ug via ORAL
  Filled 2018-12-31: qty 1.5

## 2018-12-31 MED ORDER — ACETAMINOPHEN 500 MG PO TABS
1000.0000 mg | ORAL_TABLET | Freq: Once | ORAL | Status: AC
  Administered 2018-12-31: 1000 mg via ORAL
  Filled 2018-12-31: qty 2

## 2018-12-31 NOTE — ED Notes (Signed)
Pt refusing to put on purple scrubs.  Dr Joan Mayans and charge nurse stephen aware.  Pt ate dinner tray and states feels a little better.

## 2018-12-31 NOTE — ED Notes (Signed)
Pt not pacing now but states he wants to leave.

## 2018-12-31 NOTE — ED Notes (Signed)
fsbs 145

## 2018-12-31 NOTE — ED Notes (Signed)
fsbs 87

## 2018-12-31 NOTE — ED Notes (Signed)
fsbs 53  md aware.  Insulin pump turned off.  Pt eating crackers peanut butter and drinking oj.

## 2018-12-31 NOTE — Consult Note (Signed)
[place] Psychiatry Consult   Reason for Consult:  Suicide attempt Referring Physician:  EDP Patient Identification: [name] MRN:  [id number] Principal Diagnosis: <principal problem not specified> Diagnosis:  Active Problems:   * No active hospital problems. *   Total Time spent with patient: 30 minutes  Subjective:   [name] is a 58 y.o. male patient reports that earlier today he became upset and he attempted to kill himself by going to the train tracks and walked up and down the train tracks but no train never came.  He then returned home after he got home he drank about 1-1/2 quarts of tiki torch fluid and stated that he just did not want to live anymore.  He was asked about following up with his psychiatrist Dr. Daron Offer.  He stated that that is part of the reason that he is here.  He states that he missed his appointment last week because he was at the hospital and that Dr. Joycelyn Schmid office wants to charge him $100 for missing his appointment.  He stated this upset him and they have not been able to work anything out and he has not went back to see Dr. Daron Offer.  He also reports that him and his wife had an argument as well.  He currently denies having any suicidal thoughts.  He denies any homicidal thoughts and denies any hallucinations.  HPI:  Per Dr. Joan Mayans: 58 y.o. male with history of DM (on insulin pump), depression, who presents to the ER for a suicide attempt. Patient drank about a liter of Tiki Torch fluid around 1:30 PM. He states he drank it because he doesn't want to be here anymore. He was also found wandering along a train track, he states for the same reason (hoping to get hit).   Patient is seen by this provider face-to-face.  Patient presents today appearing irritable and is upset about being at the hospital after his attempted suicide.  Patient currently denies any suicidal or homicidal ideations and denies any hallucinations, however, patient admits to 2  attempts at ending his life today.  Patient states that he does not know what he is going to do and does not feel that he can continue on the way things are going.  At this time the patient meets inpatient criteria and will need psychiatric inpatient treatment.  Past Psychiatric History: depression, anxiety,   Risk to Self:   Risk to Others:   Prior Inpatient Therapy:   Prior Outpatient Therapy:    Past Medical History:  Past Medical History:  Diagnosis Date  . Depression   . Diabetes (HCC)    Insulin Pump  . Diabetes mellitus type I ([place])   . Diabetes mellitus without complication ([place])   . GERD (gastroesophageal reflux disease)   . H/O laryngectomy   . Heel bone fracture   . Hyperlipidemia   . Hypertension   . Radicular pain of right lower extremity   . Stroke ([place])   . Suicide attempt [facility]) 2014   damaged larynx - tracheostomy  . Thyroid disease     Past Surgical History:  Procedure Laterality Date  . COLONOSCOPY WITH PROPOFOL N/A 05/15/2018   Procedure: COLONOSCOPY WITH PROPOFOL;  Surgeon: [name], [name], MD;  Location: [facility] ENDOSCOPY;  Service: Gastroenterology;  Laterality: N/A;  . ESOPHAGOGASTRODUODENOSCOPY N/A 05/15/2018   Procedure: ESOPHAGOGASTRODUODENOSCOPY (EGD);  Surgeon: [name], [name], MD;  Location: [facility] ENDOSCOPY;  Service: Gastroenterology;  Laterality: N/A;  . FRACTURE SURGERY    .  Heel bone reconstruction Left   . HERNIA REPAIR  [id number]   Umbilical hernia repair   . LARYNGECTOMY    . NECK SURGERY     fusion  . SPINE SURGERY    . TRACHEOSTOMY  2014   from SI attempt   Family History:  Family History  Problem Relation Age of Onset  . Osteoporosis Mother   . Diabetes Mother   . Hypertension Father    Family Psychiatric  History: None reported Social History:  Social History   Substance and Sexual Activity  Alcohol Use Yes  . Alcohol/week: 2.0 standard drinks  . Types: 2 Shots of liquor per week   Comment: rare     Social History    Substance and Sexual Activity  Drug Use No   Comment: Pt denied; UDS not available    Social History   Socioeconomic History  . Marital status: Married    Spouse name: Not on file  . Number of children: Not on file  . Years of education: Not on file  . Highest education level: Not on file  Occupational History  . Not on file  Social Needs  . Financial resource strain: Not on file  . Food insecurity    Worry: Not on file    Inability: Not on file  . Transportation needs    Medical: Not on file    Non-medical: Not on file  Tobacco Use  . Smoking status: Former Smoker    Packs/day: 0.00    Types: Cigarettes    Quit date: 04/09/2013    Years since quitting: 5.7  . Smokeless tobacco: Never Used  Substance and Sexual Activity  . Alcohol use: Yes    Alcohol/week: 2.0 standard drinks    Types: 2 Shots of liquor per week    Comment: rare  . Drug use: No    Comment: Pt denied; UDS not available  . Sexual activity: Yes    Partners: Female    Birth control/protection: Condom  Lifestyle  . Physical activity    Days per week: Not on file    Minutes per session: Not on file  . Stress: Not on file  Relationships  . Social Herbalist on phone: Not on file    Gets together: Not on file    Attends religious service: Not on file    Active member of club or organization: Not on file    Attends meetings of clubs or organizations: Not on file    Relationship status: Not on file  Other Topics Concern  . Not on file  Social History Narrative  . Not on file   Additional Social History:    Allergies:   Allergies  Allergen Reactions  . Buspar [Buspirone]     Makes the patient "flip out"  . Depakote [Valproic Acid]     Causes excessive drowsiness  . Clopidogrel Rash  . Gabapentin Itching and Rash    Labs:  Results for orders placed or performed during the hospital encounter of 12/31/18 (from the past 48 hour(s))  Acetaminophen level     Status: Abnormal    Collection Time: 12/31/18  4:01 PM  Result Value Ref Range   Acetaminophen (Tylenol), Serum <10 (L) 10 - 30 ug/mL    Comment: (NOTE) Therapeutic concentrations vary significantly. A range of 10-30 ug/mL  may be an effective concentration for many patients. However, some  are best treated at concentrations outside of this range. Acetaminophen concentrations >150 ug/mL  at 4 hours after ingestion  and >50 ug/mL at 12 hours after ingestion are often associated with  toxic reactions. Performed at [facility], [address] [place], [place] [address]   CBC     Status: None   Collection Time: 12/31/18  4:01 PM  Result Value Ref Range   WBC 9.7 4.0 - 10.5 K/uL   RBC 5.52 4.22 - 5.81 MIL/uL   Hemoglobin 16.3 13.0 - 17.0 g/dL   HCT 48.4 39.0 - 52.0 %   MCV 87.7 80.0 - 100.0 fL   MCH 29.5 26.0 - 34.0 pg   MCHC 33.7 30.0 - 36.0 g/dL   RDW 13.9 11.5 - 15.5 %   Platelets 303 150 - 400 K/uL   nRBC 0.0 0.0 - 0.2 %    Comment: Performed at [facility], [address] [place], [place] [address]  Comprehensive metabolic panel     Status: Abnormal   Collection Time: 12/31/18  4:01 PM  Result Value Ref Range   Sodium 139 135 - 145 mmol/L   Potassium 4.1 3.5 - 5.1 mmol/L    Comment: HEMOLYSIS AT THIS LEVEL MAY AFFECT RESULT   Chloride 105 98 - 111 mmol/L   CO2 20 (L) 22 - 32 mmol/L   Glucose, Bld 199 (H) 70 - 99 mg/dL   BUN 26 (H) 6 - 20 mg/dL   Creatinine, Ser 1.59 (H) 0.61 - 1.24 mg/dL   Calcium 9.5 8.9 - 10.3 mg/dL   Total Protein 7.2 6.5 - 8.1 g/dL   Albumin 3.9 3.5 - 5.0 g/dL   AST 21 15 - 41 U/L    Comment: HEMOLYSIS AT THIS LEVEL MAY AFFECT RESULT   ALT 15 0 - 44 U/L   Alkaline Phosphatase 110 38 - 126 U/L   Total Bilirubin 1.2 0.3 - 1.2 mg/dL    Comment: HEMOLYSIS AT THIS LEVEL MAY AFFECT RESULT   GFR calc non Af Amer 48 (L) >60 mL/min   GFR calc Af Amer 55 (L) >60 mL/min   Anion gap 14 5 - 15    Comment: Performed at [facility], [address] [place], [place] [address]  Ethanol     Status: None   Collection Time: 12/31/18  4:01 PM  Result Value Ref Range   Alcohol, Ethyl (B) <10 <10 mg/dL    Comment: (NOTE) Lowest detectable limit for serum alcohol is 10 mg/dL. For medical purposes only. Performed at [facility], [address] [place], [place] [id number]   Salicylate level     Status: None   Collection Time: 12/31/18  4:01 PM  Result Value Ref Range   Salicylate Lvl [id number] 2.8 - 30.0 mg/dL    Comment: Performed at [facility], [address] [place], [place] [address]  Urine Drug Screen, Qualitative     Status: None   Collection Time: 12/31/18  4:01 PM  Result Value Ref Range   Tricyclic, Ur Screen NONE DETECTED NONE DETECTED   Amphetamines, Ur Screen NONE DETECTED NONE DETECTED   MDMA (Ecstasy)Ur Screen NONE DETECTED NONE DETECTED   Cocaine Metabolite,Ur [place] NONE DETECTED NONE DETECTED   Opiate, Ur Screen NONE DETECTED NONE DETECTED   Phencyclidine (PCP) Ur S NONE DETECTED NONE DETECTED   Cannabinoid 50 Ng, Ur [place] NONE DETECTED NONE DETECTED   Barbiturates, Ur Screen NONE DETECTED NONE DETECTED   Benzodiazepine, Ur Scrn NONE DETECTED NONE DETECTED   Methadone Scn, Ur NONE DETECTED NONE DETECTED  Comment: (NOTE) Tricyclics + metabolites, urine    Cutoff 1000 ng/mL Amphetamines + metabolites, urine  Cutoff 1000 ng/mL MDMA (Ecstasy), urine              Cutoff 500 ng/mL Cocaine Metabolite, urine          Cutoff 300 ng/mL Opiate + metabolites, urine        Cutoff 300 ng/mL Phencyclidine (PCP), urine         Cutoff 25 ng/mL Cannabinoid, urine                 Cutoff 50 ng/mL Barbiturates + metabolites, urine  Cutoff 200 ng/mL Benzodiazepine, urine              Cutoff 200 ng/mL Methadone, urine                   Cutoff 300 ng/mL The urine drug screen provides only a preliminary, unconfirmed analytical test result and should not be used for non-medical purposes. Clinical consideration and  professional judgment should be applied to any positive drug screen result due to possible interfering substances. A more specific alternate chemical method must be used in order to obtain a confirmed analytical result. Gas chromatography / mass spectrometry (GC/MS) is the preferred confirmat ory method. Performed at [facility], [address], [place], [place] [address]     Current Facility-Administered Medications  Medication Dose Route Frequency Provider Last Rate Last Dose  . acetaminophen (TYLENOL) tablet 1,000 mg  1,000 mg Oral Once Lilia Pro., MD      . ibuprofen (ADVIL) tablet 600 mg  600 mg Oral Once Lilia Pro., MD       Current Outpatient Medications  Medication Sig Dispense Refill  . acetylcysteine (MUCOMYST) 20 % nebulizer solution Take 4 mLs by nebulization every 6 (six) hours.    Marland Kitchen amLODipine (NORVASC) 5 MG tablet Take 5 mg by mouth daily.     Marland Kitchen aspirin EC 81 MG tablet Take 1 tablet by mouth daily.    . Brexpiprazole (REXULTI) 0.5 MG TABS Take 0.5 mg by mouth daily.    . empagliflozin (JARDIANCE) 25 MG TABS tablet Take 25 mg by mouth daily.    . fluticasone (FLONASE) 50 MCG/ACT nasal spray Place 2 sprays into both nostrils daily.    . fluvoxaMINE (LUVOX) 100 MG tablet Take 2 tablets (200 mg total) by mouth at bedtime. 180 tablet 1  . insulin aspart (NOVOLOG) 100 UNIT/ML injection Inject 1.15-1.25 Units into the skin See admin instructions. Via insulin pump-basal rate: 1.15-1.25 units/hr (every 8 carbs = 1 unit of insulin bolus) 0000-0800 = 1.15 units/hr and 0801/2359 = 1.25 units/hr    . levothyroxine (SYNTHROID, LEVOTHROID) 75 MCG tablet Take 75 mcg by mouth daily before breakfast.    . losartan-hydrochlorothiazide (HYZAAR) 100-25 MG tablet Take 1 tablet by mouth daily.     . Multiple Vitamin (MULTIVITAMIN) tablet Take 1 tablet by mouth daily.    . pantoprazole (PROTONIX) 40 MG tablet Take 1 tablet by mouth 2 (two) times daily. Taking one tablet  daily    . simvastatin (ZOCOR) 20 MG tablet Take 1 tablet by mouth every evening.     . traMADol (ULTRAM) 50 MG tablet Take 100 mg by mouth 2 (two) times daily.    . traZODone (DESYREL) 100 MG tablet Take 100 mg by mouth at bedtime.    . vardenafil (LEVITRA) 20 MG tablet Take 20 mg by mouth daily as needed for erectile  dysfunction.      Musculoskeletal: Strength & Muscle Tone: within normal limits Gait & Station: normal Patient leans: N/A  Psychiatric Specialty Exam: Physical Exam  Nursing note and vitals reviewed. Constitutional: He is oriented to person, place, and time. He appears well-developed and well-nourished.  Cardiovascular: Normal rate.  Respiratory: Effort normal.  Musculoskeletal: Normal range of motion.  Neurological: He is alert and oriented to person, place, and time.    Review of Systems  Constitutional: Negative.   HENT: Negative.   Eyes: Negative.   Respiratory: Negative.   Cardiovascular: Negative.   Gastrointestinal: Negative.   Genitourinary: Negative.   Musculoskeletal: Negative.   Skin: Negative.   Neurological: Negative.   Endo/Heme/Allergies: Negative.   Psychiatric/Behavioral: Positive for depression and suicidal ideas. The patient is nervous/anxious.     Blood pressure 134/90, pulse 95, temperature 98.2 F (36.8 C), temperature source Oral, resp. rate (!) 24, height 6' (1.829 m), weight 106.6 kg, SpO2 98 %.Body mass index is 31.87 kg/m.  General Appearance: Casual  Eye Contact:  Good  Speech:  Clear and Coherent and Normal Rate  Volume:  Decreased  Mood:  Irritable  Affect:  Congruent  Thought Process:  Coherent and Descriptions of Associations: Intact  Orientation:  Full (Time, Place, and Person)  Thought Content:  WDL  Suicidal Thoughts:  Denies any currently  Homicidal Thoughts:  No  Memory:  Immediate;   Good Recent;   Good Remote;   Good  Judgement:  Fair  Insight:  Fair  Psychomotor Activity:  Normal  Concentration:   Concentration: Good  Recall:  Good  Fund of Knowledge:  Good  Language:  Good  Akathisia:  No  Handed:  Right  AIMS (if indicated):     Assets:  Communication Skills Desire for Improvement Financial Resources/Insurance Housing Resilience Social Support Transportation  ADL's:  Intact  Cognition:  WNL  Sleep:        Treatment Plan Summary: Daily contact with patient to assess and evaluate symptoms and progress in treatment and Medication management  Restart home medications  Disposition: Recommend psychiatric Inpatient admission when medically cleared.  [place], FNP 12/31/2018 6:13 PM

## 2018-12-31 NOTE — ED Notes (Signed)
Per EDP [place], ok to leave pt off of monitoring at this time d/t frequent episodes of diarrhea.

## 2018-12-31 NOTE — ED Notes (Signed)
Insulin pump removed per dr Ellender Hose  Verbal order.

## 2018-12-31 NOTE — BH Assessment (Signed)
Patient is to be admitted to [place] [place] [facility] when medically cleared.

## 2018-12-31 NOTE — ED Notes (Signed)
Pt dressed out to be transported to [facility]     Pt has cast boot on right lower leg.  Pt calm and cooperative.  Insulin pump was removed by this nurse and is with pt belongings.

## 2018-12-31 NOTE — ED Provider Notes (Signed)
[facility] Emergency Department Provider Note  ____________________________________________   First MD Initiated Contact with Patient 12/31/18 1544     (approximate)  I have reviewed the triage vital signs and the nursing notes.  History  Chief Complaint No chief complaint on file.    HPI [name] is a 58 y.o. male with history of DM (on insulin pump), depression, who presents to the ER for a suicide attempt. Patient drank about a liter of Tiki Torch fluid around 1:30 PM. He states he drank it because he "doesn't want to be here anymore." He was also found wandering along a train track, he states for the same reason (hoping to get hit). He has some associated vomiting and diarrhea after drinking the fluid.         Past Medical Hx Past Medical History:  Diagnosis Date  . Depression   . Diabetes (HCC)    Insulin Pump  . Diabetes mellitus type I ([place])   . Diabetes mellitus without complication ([place])   . GERD (gastroesophageal reflux disease)   . H/O laryngectomy   . Heel bone fracture   . Hyperlipidemia   . Hypertension   . Radicular pain of right lower extremity   . Stroke ([place])   . Suicide attempt [facility]) 2014   damaged larynx - tracheostomy  . Thyroid disease     Problem List Patient Active Problem List   Diagnosis Date Noted  . Type 1 diabetes mellitus ([place]) 09/14/2016  . Major depressive disorder, recurrent severe without psychotic features ([place]) 09/13/2016  . Hypothyroidism 09/13/2016  . HTN (hypertension) 09/13/2016  . Dyslipidemia 09/13/2016  . GERD (gastroesophageal reflux disease) 09/13/2016    Past Surgical Hx Past Surgical History:  Procedure Laterality Date  . COLONOSCOPY WITH PROPOFOL N/A 05/15/2018   Procedure: COLONOSCOPY WITH PROPOFOL;  Surgeon: [name], [name], MD;  Location: [facility] ENDOSCOPY;  Service: Gastroenterology;  Laterality: N/A;  . ESOPHAGOGASTRODUODENOSCOPY N/A 05/15/2018   Procedure:  ESOPHAGOGASTRODUODENOSCOPY (EGD);  Surgeon: [name], [name], MD;  Location: [facility] ENDOSCOPY;  Service: Gastroenterology;  Laterality: N/A;  . FRACTURE SURGERY    . Heel bone reconstruction Left   . HERNIA REPAIR  [id number]   Umbilical hernia repair   . LARYNGECTOMY    . NECK SURGERY     fusion  . SPINE SURGERY    . TRACHEOSTOMY  2014   from SI attempt    Medications Prior to Admission medications   Medication Sig Start Date End Date Taking? Authorizing Provider  acetylcysteine (MUCOMYST) 20 % nebulizer solution Take 4 mLs by nebulization every 6 (six) hours. 12/18/18   [provider]  amLODipine (NORVASC) 5 MG tablet Take 5 mg by mouth daily.  10/09/16   [provider]  aspirin EC 81 MG tablet Take 1 tablet by mouth daily.    [provider]  Brexpiprazole (REXULTI) 0.5 MG TABS Take 0.5 mg by mouth daily.    [provider]  empagliflozin (JARDIANCE) 25 MG TABS tablet Take 25 mg by mouth daily.    [provider]  fluticasone (FLONASE) 50 MCG/ACT nasal spray Place 2 sprays into both nostrils daily.    [provider]  fluvoxaMINE (LUVOX) 100 MG tablet Take 2 tablets (200 mg total) by mouth at bedtime. 12/26/17   Eksir, Richard Miu, MD  insulin aspart (NOVOLOG) 100 UNIT/ML injection Inject 1.15-1.25 Units into the skin See admin instructions. Via insulin pump-basal rate: 1.15-1.25 units/hr (every 8 carbs = 1 unit of  insulin bolus) 0000-0800 = 1.15 units/hr and 0801/2359 = 1.25 units/hr 06/26/16   [provider]  levothyroxine (SYNTHROID, LEVOTHROID) 75 MCG tablet Take 75 mcg by mouth daily before breakfast.    [provider]  losartan-hydrochlorothiazide (HYZAAR) 100-25 MG tablet Take 1 tablet by mouth daily.  01/02/17   [provider]  Multiple Vitamin (MULTIVITAMIN) tablet Take 1 tablet by mouth daily.    [provider]  pantoprazole (PROTONIX) 40 MG tablet Take 1 tablet by mouth 2 (two) times  daily. Taking one tablet daily 05/09/16   [provider]  simvastatin (ZOCOR) 20 MG tablet Take 1 tablet by mouth every evening.     [provider]  traMADol (ULTRAM) 50 MG tablet Take 100 mg by mouth 2 (two) times daily.    [provider]  traZODone (DESYREL) 100 MG tablet Take 100 mg by mouth at bedtime.    [provider]  vardenafil (LEVITRA) 20 MG tablet Take 20 mg by mouth daily as needed for erectile dysfunction.    [provider]    Allergies Buspar [buspirone], Depakote [valproic acid], Clopidogrel, and Gabapentin  Family Hx Family History  Problem Relation Age of Onset  . Osteoporosis Mother   . Diabetes Mother   . Hypertension Father     Social Hx Social History   Tobacco Use  . Smoking status: Former Smoker    Packs/day: 0.00    Types: Cigarettes    Quit date: 04/09/2013    Years since quitting: 5.7  . Smokeless tobacco: Never Used  Substance Use Topics  . Alcohol use: Yes    Alcohol/week: 2.0 standard drinks    Types: 2 Shots of liquor per week    Comment: rare  . Drug use: No    Comment: Pt denied; UDS not available     Review of Systems  Constitutional: Negative for fever. Negative for chills. Eyes: Negative for visual changes. ENT: Negative for sore throat. Cardiovascular: Negative for chest pain. Respiratory: Negative for shortness of breath. Gastrointestinal: + vomiting and diarrhea Genitourinary: Negative for dysuria. Musculoskeletal: Negative for leg swelling. Skin: Negative for rash. Neurological: Negative for for headaches.   Physical Exam  Vital Signs: ED Triage Vitals [12/31/18 1600]  Enc Vitals Group     BP 134/90     Pulse Rate 95     Resp (!) 24     Temp 98.2 F (36.8 C)     Temp Source Oral     SpO2 98 %     Weight 235 lb (106.6 kg)     Height 6' (1.829 m)     Head Circumference      Peak Flow      Pain Score 5     Pain Loc      Pain Edu?      Excl. in [place]?      Constitutional: Alert and oriented.  Eyes: Conjunctivae clear. Sclera anicteric. Head: Normocephalic. Atraumatic. Nose: No congestion. No rhinorrhea. Mouth/Throat: Mucous membranes are moist.  Neck: s/p tracheostomy w/ speech valve Cardiovascular: Normal rate, regular rhythm. No murmurs. Extremities well perfused. Respiratory: Normal respiratory effort.  Lungs CTAB. Gastrointestinal: Soft and non-tender. No distention.  Musculoskeletal: No lower extremity edema. Neurologic:  Normal speech and language. No gross focal neurologic deficits are appreciated.  Skin: Skin is warm, dry and intact. No rash noted. Psychiatric: + SI  EKG  Personally reviewed.   Rate: 92 Rhythm: sinus Axis: normal Intervals: within normal limits No acute ischemic  changes   Radiology  XR: IMPRESSION: Low volumes and atelectasis, otherwise no acute cardiopulmonary abnormality.    Procedures  Procedure(s) performed (including critical care):  Procedures   Initial Impression / Assessment and Plan / ED Course  58 y.o. male with history of depression who presents to the ED for suicide attempt.  Patient drank about a liter Tiki Torch fluid around 1:30 PM with the intent of self-harm.  He complains of nausea and diarrhea, otherwise his vital signs and EKG are within normal limits.  We will obtain basic screening labs, place under IVC, consult psychiatry and TTS.  Discussed with poison control, who advised observation until 2130 (8 hours status post ingestion).   Final Clinical Impression(s) / ED Diagnosis  Final diagnoses:  Ingestion of foreign substance     Note:  This document was prepared using Dragon voice recognition software and may include unintentional dictation errors.   Lilia Pro., MD 12/31/18 2103

## 2018-12-31 NOTE — ED Notes (Signed)
Report called to matt rn [facility] nurse

## 2018-12-31 NOTE — ED Notes (Signed)
Pt walking in hallway.  Police escorted pt back to room.  meds given.

## 2018-12-31 NOTE — ED Triage Notes (Signed)
Pt presents to ED via [facility] after suicide attempt. EMS report pt drank approx 50-75 of a large bottle of tiki torch lighter fluid around 1330 and then laid down on train tracks hoping a train would run him over. Pt vomited x1 since ingestion. C/o esophageal and epigastic burning. Pt has had multiple episodes of diarrhea since arrival but no further vomiting. Pt is A&Ox4, states directly that he drank the lighter fluid because "I don't want to be here no more." Pt to be placed under IVC by EDP with Psych consult. VS WNL a this time. CBG 234 with EMS.

## 2018-12-31 NOTE — BH Assessment (Signed)
Assessment Note  [name] is an 58 y.o. male who presents to the ER due to having thoughts of ending his life. Patient states he was upset with his wife, due to an argument they had last night (12/30/2018). Patient also reports of being upset with his outpatient psychiatrist for charging him a $100 for a miss appointment. Patient states, "he (psycharist) told me not to come to the appointment and not to worry about it. He told me to come up here (ER)." This took place last week.  Patient states, he went to the railroad tracks today(12/31/2018) with the plan of getting hit by a train. However, "the train didn't show up." Patient then went home and drunk "about a quart of Tiki fluid." Patient states he his still having thoughts of ending his life but want to go home.   During the interview, the patient was cooperative and pleasant. He was able to provide appropriate answers to the questions. He denies involvement with the legal system and no history of violence or aggression. He admits to some alcohol use. Per his report, he drinks approximately one beer once a week to a month.  Patient continues to voice SI and denies AV/H.  Diagnosis: Depression  Past Medical History:  Past Medical History:  Diagnosis Date  . Depression   . Diabetes (HCC)    Insulin Pump  . Diabetes mellitus type I ([place])   . Diabetes mellitus without complication ([place])   . GERD (gastroesophageal reflux disease)   . H/O laryngectomy   . Heel bone fracture   . Hyperlipidemia   . Hypertension   . Radicular pain of right lower extremity   . Stroke ([place])   . Suicide attempt [facility]) 2014   damaged larynx - tracheostomy  . Thyroid disease     Past Surgical History:  Procedure Laterality Date  . COLONOSCOPY WITH PROPOFOL N/A 05/15/2018   Procedure: COLONOSCOPY WITH PROPOFOL;  Surgeon: [name], [name], MD;  Location: [facility] ENDOSCOPY;  Service: Gastroenterology;  Laterality: N/A;  . ESOPHAGOGASTRODUODENOSCOPY N/A  05/15/2018   Procedure: ESOPHAGOGASTRODUODENOSCOPY (EGD);  Surgeon: [name], [name], MD;  Location: [facility] ENDOSCOPY;  Service: Gastroenterology;  Laterality: N/A;  . FRACTURE SURGERY    . Heel bone reconstruction Left   . HERNIA REPAIR  [id number]   Umbilical hernia repair   . LARYNGECTOMY    . NECK SURGERY     fusion  . SPINE SURGERY    . TRACHEOSTOMY  2014   from SI attempt    Family History:  Family History  Problem Relation Age of Onset  . Osteoporosis Mother   . Diabetes Mother   . Hypertension Father     Social History:  reports that he quit smoking about 5 years ago. His smoking use included cigarettes. He smoked 0.00 packs per day. He has never used smokeless tobacco. He reports current alcohol use of about 2.0 standard drinks of alcohol per week. He reports that he does not use drugs.  Additional Social History:  Alcohol / Drug Use Pain Medications: See PTA Prescriptions: See PTA Over the Counter: See PTA History of alcohol / drug use?: Yes Longest period of sobriety (when/how long): Unable to quantify Negative Consequences of Use: Personal relationships, Work / School Substance #1 Name of Substance 1: Alcohol 1 - Last Use / Amount: 12/31/2018  CIWA: CIWA-Ar BP: 134/90 Pulse Rate: 95 COWS:    Allergies:  Allergies  Allergen Reactions  . Buspar [Buspirone]     Makes the  patient "flip out"  . Depakote [Valproic Acid]     Causes excessive drowsiness  . Clopidogrel Rash  . Gabapentin Itching and Rash    Home Medications: (Not in a hospital admission)   OB/GYN Status:  No LMP for male patient.  General Assessment Data Location of Assessment: [facility] ED TTS Assessment: In system Is this a Tele or Face-to-Face Assessment?: Face-to-Face Is this an Initial Assessment or a Re-assessment for this encounter?: Initial Assessment Patient Accompanied by:: N/A Language Other than English: No Living Arrangements: Other (Comment)(Private Home) What gender do you  identify as?: Male Marital status: Married Pregnancy Status: No Living Arrangements: Spouse/significant other Can pt return to current living arrangement?: Yes Admission Status: Involuntary Petitioner: ED Attending Is patient capable of signing voluntary admission?: No(Under IVC) Referral Source: Self/Family/Friend Insurance type: [employer]  Medical Screening Exam ([place]) Medical Exam completed: Yes  Crisis Care Plan Living Arrangements: Spouse/significant other Name of Psychiatrist: Dr. Sheppard Coil A. Daron Offer, MD Name of Therapist: Reports of none  Education Status Is patient currently in school?: No Is the patient employed, unemployed or receiving disability?: Unemployed, Receiving disability income  Risk to self with the past 6 months Suicidal Ideation: Yes-Currently Present Has patient been a risk to self within the past 6 months prior to admission? : Yes Suicidal Intent: Yes-Currently Present Has patient had any suicidal intent within the past 6 months prior to admission? : Yes Is patient at risk for suicide?: Yes Suicidal Plan?: Yes-Currently Present Has patient had any suicidal plan within the past 6 months prior to admission? : Yes Specify Current Suicidal Plan: Walk on railroad tracks or overdose Access to Means: Yes Specify Access to Suicidal Means: Tracks he can go to and have substances to overdose with. What has been your use of drugs/alcohol within the last 12 months?: Alcohol Previous Attempts/Gestures: Yes Other Self Harm Risks: Reports of none Triggers for Past Attempts: None known Intentional Self Injurious Behavior: None Family Suicide History: No Recent stressful life event(s): Other (Comment), Conflict (Comment), Financial Problems Persecutory voices/beliefs?: No Depression: Yes Depression Symptoms: Tearfulness, Isolating, Guilt, Loss of interest in usual pleasures, Feeling worthless/self pity Substance abuse history and/or treatment  for substance abuse?: Yes Suicide prevention information given to non-admitted patients: Not applicable  Risk to Others within the past 6 months Homicidal Ideation: No Does patient have any lifetime risk of violence toward others beyond the six months prior to admission? : No Thoughts of Harm to Others: No Current Homicidal Intent: No Current Homicidal Plan: No Access to Homicidal Means: No Identified Victim: Reports of none History of harm to others?: No Assessment of Violence: None Noted Violent Behavior Description: Reports of none Does patient have access to weapons?: No Does patient have a court date: No Is patient on probation?: No  Psychosis Hallucinations: None noted Delusions: None noted  Mental Status Report Appearance/Hygiene: In scrubs, Unremarkable Eye Contact: Good Motor Activity: Freedom of movement, Unremarkable Speech: Logical/coherent, Unremarkable Level of Consciousness: Alert Mood: Depressed, Sad, Pleasant Affect: Appropriate to circumstance, Depressed, Sad Anxiety Level: Minimal Thought Processes: Coherent, Relevant Judgement: Unimpaired Orientation: Person, Place, Time, Situation, Appropriate for developmental age Obsessive Compulsive Thoughts/Behaviors: Minimal  Cognitive Functioning Concentration: Decreased Memory: Recent Intact, Remote Intact Is patient IDD: No Insight: Poor Impulse Control: Poor Appetite: Good Have you had any weight changes? : No Change Sleep: No Change Total Hours of Sleep: 8 Vegetative Symptoms: None  ADLScreening [facility] Assessment Services) Patient's cognitive ability adequate to safely complete daily activities?: Yes Patient  able to express need for assistance with ADLs?: Yes Independently performs ADLs?: Yes (appropriate for developmental age)  Prior Inpatient Therapy Prior Inpatient Therapy: Yes Prior Therapy Dates: 2018 Prior Therapy Facilty/Provider(s): [facility] Reason for Treatment: Depression   Prior  Outpatient Therapy Prior Outpatient Therapy: Yes Prior Therapy Dates: Current Prior Therapy Facilty/Provider(s): Dr. Sheppard Coil A. Daron Offer, MD Reason for Treatment: Depression Does patient have an ACCT team?: No Does patient have Intensive In-House Services?  : No Does patient have [facility] services? : No Does patient have P4CC services?: No  ADL Screening (condition at time of admission) Patient's cognitive ability adequate to safely complete daily activities?: Yes Is the patient deaf or have difficulty hearing?: No Does the patient have difficulty seeing, even when wearing glasses/contacts?: No Does the patient have difficulty concentrating, remembering, or making decisions?: No Patient able to express need for assistance with ADLs?: Yes Does the patient have difficulty dressing or bathing?: No Independently performs ADLs?: Yes (appropriate for developmental age) Does the patient have difficulty walking or climbing stairs?: No Weakness of Legs: None Weakness of Arms/Hands: None  Home Assistive Devices/Equipment Home Assistive Devices/Equipment: None  Therapy Consults (therapy consults require a physician order) PT Evaluation Needed: No OT Evalulation Needed: No SLP Evaluation Needed: No Abuse/Neglect Assessment (Assessment to be complete while patient is alone) Abuse/Neglect Assessment Can Be Completed: Yes Physical Abuse: Denies Verbal Abuse: Denies Sexual Abuse: Denies Exploitation of patient/patient's resources: Denies Self-Neglect: Denies Values / Beliefs Cultural Requests During Hospitalization: None Spiritual Requests During Hospitalization: None Consults Spiritual Care Consult Needed: No Social Work Consult Needed: No Regulatory affairs officer (For Healthcare) Does Patient Have a Medical Advance Directive?: No       Child/Adolescent Assessment Running Away Risk: Denies(Patient is an adult)  Disposition:  Disposition Initial Assessment Completed for this Encounter:  Yes  On Site Evaluation by:   Reviewed with Physician:    Durene Cal 12/31/2018 8:20 PM

## 2018-12-31 NOTE — ED Notes (Signed)
Pt eating and drinking without any diff.   Dr monks aware.  Pt calm and cooperative.

## 2018-12-31 NOTE — ED Notes (Signed)
Pt under IVC per Dr. Joan Mayans, pt to be moved to rm 25. Charge RN notified pt has trach in place with passy muir valve. Pt has not made any suicide attempts since arrival to ED but is at risk d/t previous attempt and trach. ODS outside of pt's door.

## 2018-12-31 NOTE — ED Notes (Signed)
Pt alert.  Skin warm and dry  States feeling better.

## 2018-12-31 NOTE — ED Notes (Signed)
Resumed care from [place] rn.  Pt pacing the room, dr monks contacted and meds given.

## 2019-01-01 ENCOUNTER — Other Ambulatory Visit: Payer: Self-pay

## 2019-01-01 DIAGNOSIS — T1491XA Suicide attempt, initial encounter: Secondary | ICD-10-CM

## 2019-01-01 DIAGNOSIS — T520X2S Toxic effect of petroleum products, intentional self-harm, sequela: Secondary | ICD-10-CM

## 2019-01-01 DIAGNOSIS — Z930 Tracheostomy status: Secondary | ICD-10-CM

## 2019-01-01 DIAGNOSIS — F332 Major depressive disorder, recurrent severe without psychotic features: Secondary | ICD-10-CM

## 2019-01-01 LAB — GLUCOSE, CAPILLARY
Glucose-Capillary: 121 mg/dL — ABNORMAL HIGH (ref 70–99)
Glucose-Capillary: 488 mg/dL — ABNORMAL HIGH (ref 70–99)
Glucose-Capillary: 411 mg/dL — ABNORMAL HIGH (ref 70–99)
Glucose-Capillary: 311 mg/dL — ABNORMAL HIGH (ref 70–99)
Glucose-Capillary: 235 mg/dL — ABNORMAL HIGH (ref 70–99)
Glucose-Capillary: 130 mg/dL — ABNORMAL HIGH (ref 70–99)

## 2019-01-01 LAB — BASIC METABOLIC PANEL
Anion gap: 12 (ref 5–15)
BUN: 34 mg/dL — ABNORMAL HIGH (ref 6–20)
CO2: 21 mmol/L — ABNORMAL LOW (ref 22–32)
Calcium: 9.1 mg/dL (ref 8.9–10.3)
Chloride: 100 mmol/L (ref 98–111)
Creatinine, Ser: 1.92 mg/dL — ABNORMAL HIGH (ref 0.61–1.24)
GFR calc Af Amer: 44 mL/min — ABNORMAL LOW (ref >60)
GFR calc non Af Amer: 38 mL/min — ABNORMAL LOW (ref >60)
Glucose, Bld: 499 mg/dL — ABNORMAL HIGH (ref 70–99)
Potassium: 3.9 mmol/L (ref 3.5–5.1)
Sodium: 133 mmol/L — ABNORMAL LOW (ref 135–145)

## 2019-01-01 LAB — NOVEL CORONAVIRUS, NAA (HOSP ORDER, SEND-OUT TO REF LAB; TAT 18-24 HRS): SARS-CoV-2, NAA: NOT DETECTED

## 2019-01-01 LAB — HEMOGLOBIN A1C
Hgb A1c MFr Bld: 7.8 % — ABNORMAL HIGH (ref 4.8–5.6)
Mean Plasma Glucose: 177.16 mg/dL

## 2019-01-01 MED ORDER — INSULIN GLARGINE 100 UNIT/ML ~~LOC~~ SOLN
25.0000 [IU] | Freq: Every day | SUBCUTANEOUS | Status: DC
  Administered 2019-01-01 – 2019-01-02 (×2): 25 [IU] via SUBCUTANEOUS
  Filled 2019-01-01 (×2): qty 0.25

## 2019-01-01 MED ORDER — LOSARTAN POTASSIUM 50 MG PO TABS
100.0000 mg | ORAL_TABLET | Freq: Every day | ORAL | Status: DC
  Administered 2019-01-01 – 2019-01-07 (×7): 100 mg via ORAL
  Filled 2019-01-01 (×8): qty 2

## 2019-01-01 MED ORDER — FLUVOXAMINE MALEATE 50 MG PO TABS
300.0000 mg | ORAL_TABLET | Freq: Every day | ORAL | Status: DC
  Administered 2019-01-02: 300 mg via ORAL
  Filled 2019-01-01: qty 6

## 2019-01-01 MED ORDER — INSULIN ASPART 100 UNIT/ML ~~LOC~~ SOLN: 15.0000 [IU] | Freq: Three times a day (TID) | SUBCUTANEOUS | Status: DC

## 2019-01-01 MED ORDER — LEVOTHYROXINE SODIUM 75 MCG PO TABS
75.0000 ug | ORAL_TABLET | Freq: Every day | ORAL | Status: DC
  Administered 2019-01-02 – 2019-01-07 (×6): 75 ug via ORAL
  Filled 2019-01-01 (×6): qty 1

## 2019-01-01 MED ORDER — ASPIRIN EC 81 MG PO TBEC
81.0000 mg | DELAYED_RELEASE_TABLET | Freq: Every day | ORAL | Status: DC
  Administered 2019-01-01 – 2019-01-07 (×7): 81 mg via ORAL
  Filled 2019-01-01 (×7): qty 1

## 2019-01-01 MED ORDER — INSULIN ASPART 100 UNIT/ML ~~LOC~~ SOLN
6.0000 [IU] | Freq: Three times a day (TID) | SUBCUTANEOUS | Status: DC
  Administered 2019-01-01 – 2019-01-02 (×4): 6 [IU] via SUBCUTANEOUS
  Filled 2019-01-01 (×2): qty 1

## 2019-01-01 MED ORDER — PROMETHAZINE HCL 25 MG PO TABS
25.0000 mg | ORAL_TABLET | Freq: Four times a day (QID) | ORAL | Status: DC | PRN
  Administered 2019-01-01: 25 mg via ORAL
  Filled 2019-01-01: qty 1

## 2019-01-01 MED ORDER — QUETIAPINE FUMARATE 100 MG PO TABS
100.0000 mg | ORAL_TABLET | Freq: Every day | ORAL | Status: DC
  Administered 2019-01-01 – 2019-01-02 (×2): 100 mg via ORAL
  Filled 2019-01-01 (×2): qty 1

## 2019-01-01 MED ORDER — PANTOPRAZOLE SODIUM 40 MG PO TBEC
40.0000 mg | DELAYED_RELEASE_TABLET | Freq: Every day | ORAL | Status: DC
  Administered 2019-01-01 – 2019-01-07 (×7): 40 mg via ORAL
  Filled 2019-01-01 (×7): qty 1

## 2019-01-01 MED ORDER — AMLODIPINE BESYLATE 5 MG PO TABS
5.0000 mg | ORAL_TABLET | Freq: Every day | ORAL | Status: DC
  Administered 2019-01-01 – 2019-01-07 (×7): 5 mg via ORAL
  Filled 2019-01-01 (×7): qty 1

## 2019-01-01 MED ORDER — INSULIN ASPART 100 UNIT/ML ~~LOC~~ SOLN
0.0000 [IU] | Freq: Three times a day (TID) | SUBCUTANEOUS | Status: DC
  Administered 2019-01-01: 15 [IU] via SUBCUTANEOUS
  Filled 2019-01-01: qty 1

## 2019-01-01 MED ORDER — DIPHENHYDRAMINE HCL 25 MG PO CAPS
50.0000 mg | ORAL_CAPSULE | Freq: Four times a day (QID) | ORAL | Status: DC | PRN
  Administered 2019-01-01 – 2019-01-07 (×7): 50 mg via ORAL
  Filled 2019-01-01 (×7): qty 2

## 2019-01-01 MED ORDER — SIMVASTATIN 20 MG PO TABS
20.0000 mg | ORAL_TABLET | Freq: Every day | ORAL | Status: DC
  Administered 2019-01-01 – 2019-01-06 (×6): 20 mg via ORAL
  Filled 2019-01-01 (×7): qty 1

## 2019-01-01 MED ORDER — TRAZODONE HCL 100 MG PO TABS
100.0000 mg | ORAL_TABLET | Freq: Every day | ORAL | Status: DC
  Administered 2019-01-01 – 2019-01-06 (×7): 100 mg via ORAL
  Filled 2019-01-01 (×7): qty 1

## 2019-01-01 MED ORDER — HYDROCHLOROTHIAZIDE 25 MG PO TABS
25.0000 mg | ORAL_TABLET | Freq: Every day | ORAL | Status: DC
  Administered 2019-01-01 – 2019-01-07 (×7): 25 mg via ORAL
  Filled 2019-01-01 (×7): qty 1

## 2019-01-01 MED ORDER — INSULIN ASPART 100 UNIT/ML ~~LOC~~ SOLN
10.0000 [IU] | Freq: Once | SUBCUTANEOUS | Status: AC
  Administered 2019-01-01: 10 [IU] via SUBCUTANEOUS
  Filled 2019-01-01: qty 1

## 2019-01-01 MED ORDER — INSULIN ASPART 100 UNIT/ML ~~LOC~~ SOLN
0.0000 [IU] | Freq: Three times a day (TID) | SUBCUTANEOUS | Status: DC
  Administered 2019-01-01: 15 [IU] via SUBCUTANEOUS
  Administered 2019-01-01: 7 [IU] via SUBCUTANEOUS
  Administered 2019-01-02: 15 [IU] via SUBCUTANEOUS
  Administered 2019-01-02: 4 [IU] via SUBCUTANEOUS
  Administered 2019-01-02: 7 [IU] via SUBCUTANEOUS
  Administered 2019-01-03: 15 [IU] via SUBCUTANEOUS
  Administered 2019-01-03: 4 [IU] via SUBCUTANEOUS
  Administered 2019-01-04: 11 [IU] via SUBCUTANEOUS
  Administered 2019-01-04: 7 [IU] via SUBCUTANEOUS
  Administered 2019-01-05: 11 [IU] via SUBCUTANEOUS
  Administered 2019-01-05 – 2019-01-06 (×3): 4 [IU] via SUBCUTANEOUS
  Administered 2019-01-06: 11 [IU] via SUBCUTANEOUS
  Administered 2019-01-07: 7 [IU] via SUBCUTANEOUS
  Administered 2019-01-07: 08:00:00 11 [IU] via SUBCUTANEOUS
  Filled 2019-01-01 (×11): qty 1

## 2019-01-01 NOTE — Tx Team (Addendum)
Interdisciplinary Treatment and Diagnostic Plan Update  01/01/2019 Time of Session: 230p [name] MRN: [id number]  Principal Diagnosis: Major depressive disorder, recurrent severe without psychotic features ([place])  Secondary Diagnoses: Principal Problem:   Major depressive disorder, recurrent severe without psychotic features ([name]) Active Problems:   Hypothyroidism   HTN (hypertension)   Dyslipidemia   GERD (gastroesophageal reflux disease)   Type 1 diabetes mellitus ([name])   Toxic effect of petroleum products, intentional self-harm, sequela ([name])   Suicide attempt ([name])   Tracheostomy in place [facility])   Current Medications:  Current Facility-Administered Medications  Medication Dose Route Frequency Provider Last Rate Last Dose  . amLODipine (NORVASC) tablet 5 mg  5 mg Oral Daily Clapacs, [name], MD   5 mg at 01/01/19 1328  . aspirin EC tablet 81 mg  81 mg Oral Daily Clapacs, [name], MD   81 mg at 01/01/19 1328  . [START ON 01/02/2019] fluvoxaMINE (LUVOX) tablet 300 mg  300 mg Oral QAC breakfast Clapacs, [name], MD      . hydrochlorothiazide (HYDRODIURIL) tablet 25 mg  25 mg Oral Daily Clapacs, [name], MD   25 mg at 01/01/19 1328  . insulin aspart (novoLOG) injection 0-20 Units  0-20 Units Subcutaneous TID WC Clapacs, [name], MD   15 Units at 01/01/19 1209  . insulin aspart (novoLOG) injection 6 Units  6 Units Subcutaneous TID WC Clapacs, [name], MD   6 Units at 01/01/19 1208  . insulin glargine (LANTUS) injection 25 Units  25 Units Subcutaneous Daily Clapacs, [name], MD   25 Units at 01/01/19 1116  . [START ON 01/02/2019] levothyroxine (SYNTHROID) tablet 75 mcg  75 mcg Oral Q0600 Clapacs, [name], MD      . losartan (COZAAR) tablet 100 mg  100 mg Oral Daily Clapacs, [name], MD      . pantoprazole (PROTONIX) EC tablet 40 mg  40 mg Oral Daily Clapacs, [name], MD   40 mg at 01/01/19 1328  . promethazine (PHENERGAN) tablet 25 mg  25 mg Oral Q6H PRN Clapacs, [name], MD   25 mg at  01/01/19 1329  . QUEtiapine (SEROQUEL) tablet 100 mg  100 mg Oral QHS Clapacs, [name], MD      . simvastatin (ZOCOR) tablet 20 mg  20 mg Oral q1800 Clapacs, [name], MD      . traZODone (DESYREL) tablet 100 mg  100 mg Oral QHS [name], NP   100 mg at 01/01/19 0047   PTA Medications: Medications Prior to Admission  Medication Sig Dispense Refill Last Dose  . acetylcysteine (MUCOMYST) 20 % nebulizer solution Take 4 mLs by nebulization every 6 (six) hours.     Marland Kitchen amLODipine (NORVASC) 5 MG tablet Take 5 mg by mouth daily.      Marland Kitchen aspirin EC 81 MG tablet Take 1 tablet by mouth daily.     . Brexpiprazole (REXULTI) 0.5 MG TABS Take 0.5 mg by mouth daily.     . empagliflozin (JARDIANCE) 25 MG TABS tablet Take 25 mg by mouth daily.     . fluticasone (FLONASE) 50 MCG/ACT nasal spray Place 2 sprays into both nostrils daily.     . fluvoxaMINE (LUVOX) 100 MG tablet Take 2 tablets (200 mg total) by mouth at bedtime. 180 tablet 1   . insulin aspart (NOVOLOG) 100 UNIT/ML injection Inject 1.15-1.25 Units into the skin See admin instructions. Via insulin pump-basal rate: 1.15-1.25 units/hr (every 8 carbs = 1 unit  of insulin bolus) 0000-0800 = 1.15 units/hr and 0801/2359 = 1.25 units/hr     . levothyroxine (SYNTHROID, LEVOTHROID) 75 MCG tablet Take 75 mcg by mouth daily before breakfast.     . losartan-hydrochlorothiazide (HYZAAR) 100-25 MG tablet Take 1 tablet by mouth daily.      . Multiple Vitamin (MULTIVITAMIN) tablet Take 1 tablet by mouth daily.     . pantoprazole (PROTONIX) 40 MG tablet Take 1 tablet by mouth 2 (two) times daily. Taking one tablet daily     . simvastatin (ZOCOR) 20 MG tablet Take 1 tablet by mouth every evening.      . traMADol (ULTRAM) 50 MG tablet Take 100 mg by mouth 2 (two) times daily.     . traZODone (DESYREL) 100 MG tablet Take 100 mg by mouth at bedtime.     . vardenafil (LEVITRA) 20 MG tablet Take 20 mg by mouth daily as needed for erectile dysfunction.       Patient  Stressors: Health problems Medication change or noncompliance Traumatic event  Patient Strengths: Curator fund of knowledge Motivation for treatment/growth  Treatment Modalities: Medication Management, Group therapy, Case management,  1 to 1 session with clinician, Psychoeducation, Recreational therapy.   Physician Treatment Plan for Primary Diagnosis: Major depressive disorder, recurrent severe without psychotic features ([place]) Long Term Goal(s): Improvement in symptoms so as ready for discharge Improvement in symptoms so as ready for discharge   Short Term Goals: Ability to disclose and discuss suicidal ideas Ability to demonstrate self-control will improve Ability to identify and develop effective coping behaviors will improve Ability to maintain clinical measurements within normal limits will improve Compliance with prescribed medications will improve  Medication Management: Evaluate patient's response, side effects, and tolerance of medication regimen.  Therapeutic Interventions: 1 to 1 sessions, Unit Group sessions and Medication administration.  Evaluation of Outcomes: Not Met  Physician Treatment Plan for Secondary Diagnosis: Principal Problem:   Major depressive disorder, recurrent severe without psychotic features ([place]) Active Problems:   Hypothyroidism   HTN (hypertension)   Dyslipidemia   GERD (gastroesophageal reflux disease)   Type 1 diabetes mellitus ([name])   Toxic effect of petroleum products, intentional self-harm, sequela ([name])   Suicide attempt ([name])   Tracheostomy in place [facility])  Long Term Goal(s): Improvement in symptoms so as ready for discharge Improvement in symptoms so as ready for discharge   Short Term Goals: Ability to disclose and discuss suicidal ideas Ability to demonstrate self-control will improve Ability to identify and develop effective coping behaviors will improve Ability to maintain clinical measurements within  normal limits will improve Compliance with prescribed medications will improve     Medication Management: Evaluate patient's response, side effects, and tolerance of medication regimen.  Therapeutic Interventions: 1 to 1 sessions, Unit Group sessions and Medication administration.  Evaluation of Outcomes: Not Met   RN Treatment Plan for Primary Diagnosis: Major depressive disorder, recurrent severe without psychotic features ([place]) Long Term Goal(s): Knowledge of disease and therapeutic regimen to maintain health will improve  Short Term Goals: Ability to demonstrate self-control, Ability to participate in decision making will improve, Ability to disclose and discuss suicidal ideas, Ability to identify and develop effective coping behaviors will improve and Compliance with prescribed medications will improve  Medication Management: RN will administer medications as ordered by provider, will assess and evaluate patient's response and provide education to patient for prescribed medication. RN will report any adverse and/or side effects to prescribing provider.  Therapeutic Interventions: 1 on  1 counseling sessions, Psychoeducation, Medication administration, Evaluate responses to treatment, Monitor vital signs and CBGs as ordered, Perform/monitor CIWA, COWS, AIMS and Fall Risk screenings as ordered, Perform wound care treatments as ordered.  Evaluation of Outcomes: Not Met   LCSW Treatment Plan for Primary Diagnosis: Major depressive disorder, recurrent severe without psychotic features ([place]) Long Term Goal(s): Safe transition to appropriate next level of care at discharge, Engage patient in therapeutic group addressing interpersonal concerns.  Short Term Goals: Engage patient in aftercare planning with referrals and resources  Therapeutic Interventions: Assess for all discharge needs, 1 to 1 time with Social worker, Explore available resources and support systems, Assess for adequacy in  community support network, Educate family and significant other(s) on suicide prevention, Complete Psychosocial Assessment, Interpersonal group therapy.  Evaluation of Outcomes: Not Met   Progress in Treatment: Attending groups: Yes. Participating in groups: Yes. Taking medication as prescribed: Yes. Toleration medication: Yes. Family/Significant other contact made: Yes, individual(s) contacted:  [name], wife Patient understands diagnosis: Yes. Discussing patient identified problems/goals with staff: Yes. Medical problems stabilized or resolved: No. Denies suicidal/homicidal ideation: Yes. Issues/concerns per patient self-inventory: No. Other: NA  New problem(s) identified: No, Describe:  none reported  New Short Term/Long Term Goal(s):Attend outpatient treatment, take medication as prescribed, develop and implement healthy coping methods  Patient Goals:  "Get my medication right, turn negative thoughts into positive thoughts"  Discharge Plan or Barriers: Pt will return home and follow up at [facility] for outpatient treatment  Reason for Continuation of Hospitalization: Depression Medication stabilization  Estimated Length of Stay:3-5 days  Recreational Therapy: Patient Stressors: N/A  Patient Goal: Patient will engage in groups without prompting or encouragement from LRT x3 group sessions within 5 recreation therapy group sessions  Attendees: Patient:Alp Gironda 01/01/2019 3:34 PM  Physician: Alethia Berthold 01/01/2019 3:34 PM  Nursing:  01/01/2019 3:34 PM  RN Care Manager: 01/01/2019 3:34 PM  Social Worker: Sanjuana Kava 01/01/2019 3:34 PM  Recreational Therapist:  01/01/2019 3:34 PM  Other:  01/01/2019 3:34 PM  Other:  01/01/2019 3:34 PM  Other: 01/01/2019 3:34 PM    Scribe for Treatment Team: Yvette Rack, LCSW 01/01/2019 3:34 PM

## 2019-01-01 NOTE — Progress Notes (Signed)
Patient nurse aware of patient blood sugar results.

## 2019-01-01 NOTE — Progress Notes (Addendum)
Patient's blood glucose is 488, patient appeared restless and thirsty , he was given 15 units of novolog standing order and MD on call notified via page awaiting MD response, relief RN aware of Patient's condition she will continue to monitor.

## 2019-01-01 NOTE — Tx Team (Signed)
Admission Note:  58 yr male who presents IVC in no acute distress for the treatment of SI and Depression. Patient appears flat and sad, he was anxious he asked for sleeping medication upon arrival on the unit. Patient was calm and cooperative with admission process, he currently denies SI/HI/AVH and contracts for safety upon admission. Patient was admitted for attempting to commit suicide by drinking lighter fluid.  Patient is a diabetic his blood sugar upon admission was 121, he stated "I need coverage for tonight Nurse, mental health contacted NP on call she advised that MD ( attending) will look into his medication orders tomorrow since his blood sugar is stable tonight. Patient has past medical hx of Depression and DM without complication, HTN, GERD and Hypothyroidism.   Patient Skin was assessed and found to be dry and intact, he has a cast to his left leg from an injury, hx of laryngectomy and has a voice box which he uses for communication. Patient was also searched for contraband non found, POC and unit policies explained,  understanding verbalized.and consents obtained. Food and fluids offered, patient accepted a sandwich and a cup of water. 15 minutes safety checks maintained will continue to monitor

## 2019-01-01 NOTE — Plan of Care (Signed)
Visible and active in the the milieu and compliant  with treatment.

## 2019-01-01 NOTE — Plan of Care (Signed)
Patient newly admitted, hasn't had time to progress.   Problem: Education: Goal: Knowledge of [facility] Education information/materials will improve Outcome: Not Progressing Goal: Emotional status will improve Outcome: Not Progressing Goal: Mental status will improve Outcome: Not Progressing Goal: Verbalization of understanding the information provided will improve Outcome: Not Progressing   Problem: Safety: Goal: Periods of time without injury will increase Outcome: Not Progressing   Problem: Education: Goal: Ability to make informed decisions regarding treatment will improve Outcome: Not Progressing   Problem: Health Behavior/Discharge Planning: Goal: Identification of resources available to assist in meeting health care needs will improve Outcome: Not Progressing   

## 2019-01-01 NOTE — [facility] Suicide Risk Assessment (Addendum)
[facility] INPATIENT:  Family/Significant Other Suicide Prevention Education  Suicide Prevention Education:  Education Completed; [name], wife, [phone] has been identified by the patient as the family member/significant other with whom the patient will be residing, and identified as the person(s) who will aid the patient in the event of a mental health crisis (suicidal ideations/suicide attempt).  With written consent from the patient, the family member/significant other has been provided the following suicide prevention education, prior to the and/or following the discharge of the patient.  The suicide prevention education provided includes the following:  Suicide risk factors  Suicide prevention and interventions  National Suicide Hotline telephone number  [facility] assessment telephone number  [facility] Emergency Assistance [place] and/or Residential Mobile Crisis Unit telephone number  Request made of family/significant other to:  Remove weapons (e.g., guns, rifles, knives), all items previously/currently identified as safety concern.    Remove drugs/medications (over-the-counter, prescriptions, illicit drugs), all items previously/currently identified as a safety concern.  The family member/significant other verbalizes understanding of the suicide prevention education information provided.  The family member/significant other agrees to remove the items of safety concern listed above.  Wife reports no weapons in the home.  Wife reports concerns that changes in mood behavior seemed to worsen following a change in medications.  She also notes stress from combined health issues negative impacting the patient.   [name] 01/01/2019, 12:11 PM

## 2019-01-01 NOTE — H&P (Signed)
Psychiatric Admission Assessment Adult  Patient Identification: [name] MRN:  [id number] Date of Evaluation:  01/01/2019 Chief Complaint:  Depression Principal Diagnosis: Major depressive disorder, recurrent severe without psychotic features ([place]) Diagnosis:  Principal Problem:   Major depressive disorder, recurrent severe without psychotic features ([place]) Active Problems:   Hypothyroidism   HTN (hypertension)   Dyslipidemia   GERD (gastroesophageal reflux disease)   Type 1 diabetes mellitus ([place])   Toxic effect of petroleum products, intentional self-harm, sequela ([place])   Suicide attempt ([place])   Tracheostomy in place [facility])  History of Present Illness: Patient seen chart reviewed.  This is a patient with a long history of depression who came to the hospital after intentional suicide attempt.  He says he woke up yesterday just feeling overwhelmed by all of his problems and decided to kill himself.  He went [place] the railroad tracks with the thought of being hit by a train but when no train came by he went home and drank some of the fuel that he is used in an outdoor yard [employer]/gallery conserv[employer].  I believe that this product is pretty much the same as mineral spirits.  Came into the hospital.  Stabilized in the emergency room.  Has been having nausea burning in his throat and diarrhea all of which are the unfortunate and unpleasant consequences of drinking petroleum products.  Continues to be depressed.  Says he has been depressed for "a long time".  Cannot even put his finger on how long recently things have been worse.  Mood feels bad depressed anxious and overwhelmed all the time.  Recent stresses were his outpatient psychiatrist giving him a bill for a missed appointment.  Patient thought it was an unfair bill and is upset about it.  It sounds like financial problems are a major trigger for the patient.  He has chronic poor sleep with his days and nights turned around.  Low energy.  Anxiety all the time.   Denies hallucinations no obvious delusions.  Chronic suicidal ideation no homicidal ideation.  He denies that he is using alcohol or drugs.  Alcohol and drug screens on admission were all negative.  Currently sees an outpatient psychiatrist but is not finding the experience currently to be helpful.  His current medication is Luvox 200 mg a day and Rexulti 1/2 mg/day. Associated Signs/Symptoms: Depression Symptoms:  depressed mood, insomnia, psychomotor retardation, fatigue, feelings of worthlessness/guilt, difficulty concentrating, hopelessness, suicidal attempt, anxiety, disturbed sleep, (Hypo) Manic Symptoms:  Impulsivity, Anxiety Symptoms:  Excessive Worry, Psychotic Symptoms:  None currently reported PTSD Symptoms: Negative Total Time spent with patient: 1 hour  Past Psychiatric History: Patient has a long history of depressive symptoms going back many years.  His first admission here was in 2014 but he had a lot of psychiatric treatment even prior to that.  He has had several serious suicide attempts in his life.  No evidence of mania or bipolar disorder that I can find.  Long ago he would sometimes have hallucinations but has had no psychotic symptoms in the last few years.  He has been on multiple medications over time including several different antidepressants and several antipsychotics and mood stabilizers.  It sounds like things work for a period of time and then seem to lose their effectiveness.  There is been a fair bit of up and down of his medicines just in the last few years.  No history of violence to others.  Distant history probably 10 years ago or so of drinking  but the patient says he has not had a drinking or drug problem since then.  A couple years ago he was still being prescribed alprazolam by his outpatient provider.  Patient found that extremely helpful for sleep.  Most recently he is not on that.  Is the patient at risk to self? Yes.    Has the patient been a risk to  self in the past 6 months? Yes.    Has the patient been a risk to self within the distant past? Yes.    Is the patient a risk to others? No.  Has the patient been a risk to others in the past 6 months? No.  Has the patient been a risk to others within the distant past? No.   Prior Inpatient Therapy:   Prior Outpatient Therapy:    Alcohol Screening: 1. How often do you have a drink containing alcohol?: Never 2. How many drinks containing alcohol do you have on a typical day when you are drinking?: 1 or 2 3. How often do you have six or more drinks on one occasion?: Never AUDIT-C Score: 0 4. How often during the last year have you found that you were not able to stop drinking once you had started?: Never 5. How often during the last year have you failed to do what was normally expected from you becasue of drinking?: Never 6. How often during the last year have you needed a first drink in the morning to get yourself going after a heavy drinking session?: Never 7. How often during the last year have you had a feeling of guilt of remorse after drinking?: Never 8. How often during the last year have you been unable to remember what happened the night before because you had been drinking?: Never 9. Have you or someone else been injured as a result of your drinking?: No 10. Has a relative or friend or a doctor or another health worker been concerned about your drinking or suggested you cut down?: No Alcohol Use Disorder Identification Test Final Score (AUDIT): 0 Alcohol Brief Interventions/Follow-up: AUDIT Score <7 follow-up not indicated Substance Abuse History in the last 12 months:  No. Consequences of Substance Abuse: Negative Previous Psychotropic Medications: Yes  Psychological Evaluations: Yes  Past Medical History:  Past Medical History:  Diagnosis Date   Depression    Diabetes ([place])    Insulin Pump   Diabetes mellitus type I ([place])    Diabetes mellitus without complication ([place])     GERD (gastroesophageal reflux disease)    H/O laryngectomy    Heel bone fracture    Hyperlipidemia    Hypertension    Radicular pain of right lower extremity    Stroke ([place])    Suicide attempt ([place]) 2014   damaged larynx - tracheostomy   Thyroid disease     Past Surgical History:  Procedure Laterality Date   COLONOSCOPY WITH PROPOFOL N/A 05/15/2018   Procedure: COLONOSCOPY WITH PROPOFOL;  Surgeon: [name], [name], MD;  Location: [facility] ENDOSCOPY;  Service: Gastroenterology;  Laterality: N/A;   ESOPHAGOGASTRODUODENOSCOPY N/A 05/15/2018   Procedure: ESOPHAGOGASTRODUODENOSCOPY (EGD);  Surgeon: [name], [name], MD;  Location: [facility] ENDOSCOPY;  Service: Gastroenterology;  Laterality: N/A;   FRACTURE SURGERY     Heel bone reconstruction Left    HERNIA REPAIR  [id number]   Umbilical hernia repair    LARYNGECTOMY     NECK SURGERY     fusion   [place]  2014  from [place] attempt   Family History:  Family History  Problem Relation Age of Onset   Osteoporosis Mother    Diabetes Mother    Hypertension Father    Family Psychiatric  History: No known family history Tobacco Screening: Have you used any form of tobacco in the last 30 days? (Cigarettes, Smokeless Tobacco, Cigars, and/or Pipes): No Social History:  Social History   Substance and Sexual Activity  Alcohol Use Yes   Alcohol/week: 2.0 standard drinks   Types: 2 Shots of liquor per week   Comment: rare     Social History   Substance and Sexual Activity  Drug Use No   Comment: Pt denied; UDS not available    Additional Social History: Marital status: Married Number of Years Married: 1 What types of issues is patient dealing with in the relationship?: Pt reports "cell phone stuff.  She thinks that I am going on dating websites and gets mad.  I'm not.: Are you sexually active?: No What is your sexual orientation?: heterosexual Has your sexual activity been affected by drugs,  alcohol, medication, or emotional stress?: Pt reports that sex drive has been negative impacted by medications. Does patient have children?: Yes How many children?: 4 How is patient's relationship with their children?: Pt reports "I don't see them much, 2 are in the [place], one is in college and the other I am not that close with.  They're all so busy."                         Allergies:   Allergies  Allergen Reactions   Buspar [Buspirone]     Makes the patient "flip out"   Depakote [Valproic Acid]     Causes excessive drowsiness   Clopidogrel Rash   Gabapentin Itching and Rash   Lab Results:  Results for orders placed or performed during the hospital encounter of 12/31/18 (from the past 48 hour(s))  Glucose, capillary     Status: Abnormal   Collection Time: 12/31/18 11:56 PM  Result Value Ref Range   Glucose-Capillary 121 (H) 70 - 99 mg/dL  Glucose, capillary     Status: Abnormal   Collection Time: 01/01/19  6:51 AM  Result Value Ref Range   Glucose-Capillary 488 (H) 70 - 99 mg/dL  Glucose, capillary     Status: Abnormal   Collection Time: 01/01/19  8:09 AM  Result Value Ref Range   Glucose-Capillary 411 (H) 70 - 99 mg/dL  Basic metabolic panel     Status: Abnormal   Collection Time: 01/01/19  9:21 AM  Result Value Ref Range   Sodium 133 (L) 135 - 145 mmol/L   Potassium 3.9 3.5 - 5.1 mmol/L   Chloride 100 98 - 111 mmol/L   CO2 21 (L) 22 - 32 mmol/L   Glucose, Bld 499 (H) 70 - 99 mg/dL   BUN 34 (H) 6 - 20 mg/dL   Creatinine, Ser 1.92 (H) 0.61 - 1.24 mg/dL   Calcium 9.1 8.9 - 10.3 mg/dL   GFR calc non Af Amer 38 (L) >60 mL/min   GFR calc Af Amer 44 (L) >60 mL/min   Anion gap 12 5 - 15    Comment: Performed at [facility], [address] [place],  [id number]  Glucose, capillary     Status: Abnormal   Collection Time: 01/01/19 11:04 AM  Result Value Ref Range   Glucose-Capillary 311 (H) 70 - 99 mg/dL  Blood Alcohol level:  Lab  Results  Component Value Date   ETH <10 12/31/2018   ETH <10 123XX123    Metabolic Disorder Labs:  Lab Results  Component Value Date   HGBA1C 7.7 (H) 12/23/2018   MPG 174.29 12/23/2018   MPG 186 09/15/2016   No results found for: PROLACTIN Lab Results  Component Value Date   CHOL 161 09/15/2016   TRIG 190 (H) 09/15/2016   HDL 41 09/15/2016   CHOLHDL 3.9 09/15/2016   VLDL 38 09/15/2016   LDLCALC 82 09/15/2016    Current Medications: Current Facility-Administered Medications  Medication Dose Route Frequency Provider Last Rate Last Dose   amLODipine (NORVASC) tablet 5 mg  5 mg Oral Daily Sinclair Arrazola, [name], MD       aspirin EC tablet 81 mg  81 mg Oral Daily Deshanna Kama, [name], MD       Derrill Memo ON 01/02/2019] fluvoxaMINE (LUVOX) tablet 300 mg  300 mg Oral QAC breakfast Raeleen Winstanley, [name], MD       hydrochlorothiazide (HYDRODIURIL) tablet 25 mg  25 mg Oral Daily Samarah Hogle T, MD       insulin aspart (novoLOG) injection 0-20 Units  0-20 Units Subcutaneous TID WC Kadence Mikkelson, [name], MD   15 Units at 01/01/19 1209   insulin aspart (novoLOG) injection 6 Units  6 Units Subcutaneous TID WC Sharnetta Gielow, [name], MD   6 Units at 01/01/19 1208   insulin glargine (LANTUS) injection 25 Units  25 Units Subcutaneous Daily Tuck Dulworth, [name], MD   25 Units at 01/01/19 1116   [START ON 01/02/2019] levothyroxine (SYNTHROID) tablet 75 mcg  75 mcg Oral Q0600 Rogan Ecklund, [name], MD       losartan (COZAAR) tablet 100 mg  100 mg Oral Daily Emmelia Holdsworth T, MD       pantoprazole (PROTONIX) EC tablet 40 mg  40 mg Oral Daily Takiah Maiden, [name], MD       promethazine (PHENERGAN) tablet 25 mg  25 mg Oral Q6H PRN Tanairi Cypert T, MD       QUEtiapine (SEROQUEL) tablet 100 mg  100 mg Oral QHS Deette Revak T, MD       simvastatin (ZOCOR) tablet 20 mg  20 mg Oral q1800 Jahlisa Rossitto, [name], MD       traZODone (DESYREL) tablet 100 mg  100 mg Oral QHS [name], NP   100 mg at 01/01/19 0047   PTA  Medications: Medications Prior to Admission  Medication Sig Dispense Refill Last Dose   acetylcysteine (MUCOMYST) 20 % nebulizer solution Take 4 mLs by nebulization every 6 (six) hours.      amLODipine (NORVASC) 5 MG tablet Take 5 mg by mouth daily.       aspirin EC 81 MG tablet Take 1 tablet by mouth daily.      Brexpiprazole (REXULTI) 0.5 MG TABS Take 0.5 mg by mouth daily.      empagliflozin (JARDIANCE) 25 MG TABS tablet Take 25 mg by mouth daily.      fluticasone (FLONASE) 50 MCG/ACT nasal spray Place 2 sprays into both nostrils daily.      fluvoxaMINE (LUVOX) 100 MG tablet Take 2 tablets (200 mg total) by mouth at bedtime. 180 tablet 1    insulin aspart (NOVOLOG) 100 UNIT/ML injection Inject 1.15-1.25 Units into the skin See admin instructions. Via insulin pump-basal rate: 1.15-1.25 units/hr (every 8 carbs = 1 unit of insulin bolus) 0000-0800 = 1.15 units/hr and 0801/2359 = 1.25 units/hr  levothyroxine (SYNTHROID, LEVOTHROID) 75 MCG tablet Take 75 mcg by mouth daily before breakfast.      losartan-hydrochlorothiazide (HYZAAR) 100-25 MG tablet Take 1 tablet by mouth daily.       Multiple Vitamin (MULTIVITAMIN) tablet Take 1 tablet by mouth daily.      pantoprazole (PROTONIX) 40 MG tablet Take 1 tablet by mouth 2 (two) times daily. Taking one tablet daily      simvastatin (ZOCOR) 20 MG tablet Take 1 tablet by mouth every evening.       traMADol (ULTRAM) 50 MG tablet Take 100 mg by mouth 2 (two) times daily.      traZODone (DESYREL) 100 MG tablet Take 100 mg by mouth at bedtime.      vardenafil (LEVITRA) 20 MG tablet Take 20 mg by mouth daily as needed for erectile dysfunction.       Musculoskeletal: Strength & Muscle Tone: within normal limits Gait & Station: normal Patient leans: N/A  Psychiatric Specialty Exam: Physical Exam  Nursing note and vitals reviewed. Constitutional: He appears well-developed and well-nourished.  HENT:  Head: Normocephalic and  atraumatic.  Eyes: Pupils are equal, round, and reactive to light. Conjunctivae are normal.  Neck: Normal range of motion.  Cardiovascular: Normal heart sounds.  Respiratory: Effort normal.  GI: Soft.  Musculoskeletal: Normal range of motion.  Neurological: He is alert.  Skin: Skin is warm and dry.     Psychiatric: His speech is normal. He is not agitated and not aggressive. Thought content is not paranoid. Cognition and memory are normal. He expresses impulsivity and inappropriate judgment. He exhibits a depressed mood. He expresses suicidal ideation. He expresses suicidal plans.    Review of Systems  Constitutional: Negative.   HENT: Negative.   Eyes: Negative.   Respiratory: Negative.   Cardiovascular: Negative.   Gastrointestinal: Positive for diarrhea and nausea.  Musculoskeletal: Negative.   Skin: Negative.   Neurological: Negative.   Psychiatric/Behavioral: Positive for depression and suicidal ideas. Negative for hallucinations, memory loss and substance abuse. The patient is nervous/anxious and has insomnia.     Blood pressure (!) 149/78, pulse 84, temperature 98 F (36.7 C), temperature source Oral, resp. rate 16, height 6' (1.829 m), weight 108.9 kg, SpO2 98 %.Body mass index is 32.55 kg/m.  General Appearance: Casual  Eye Contact:  Good  Speech:  Clear and Coherent  Volume:  Decreased  Mood:  Depressed  Affect:  Congruent  Thought Process:  Coherent  Orientation:  Full (Time, Place, and Person)  Thought Content:  Logical  Suicidal Thoughts:  Yes.  with intent/plan  Homicidal Thoughts:  No  Memory:  Immediate;   Fair Recent;   Fair Remote;   Fair  Judgement:  Impaired  Insight:  Shallow  Psychomotor Activity:  Normal  Concentration:  Concentration: Fair  Recall:  [employer] of Knowledge:  Fair  Language:  Fair  Akathisia:  No  Handed:  Right  AIMS (if indicated):     Assets:  Desire for Improvement Housing Social Support  ADL's:  Intact  Cognition:   WNL  Sleep:  Number of Hours: 3    Treatment Plan Summary: Daily contact with patient to assess and evaluate symptoms and progress in treatment, Medication management and Plan Reviewed medications.  First of all he has type 1 diabetes and is normally on an insulin pump.  That was removed to allow him to be admitted to the psychiatric unit and immediately his sugars became wildly out of control.  We  are working with diabetes coordinator to try and get that under better control.  Also restart his medicine for blood pressure dyslipidemia hypothyroidism.  He is currently having a lot of discomfort from drinking the petroleum product.  He requested something for nausea and I ordered some Phenergan as a as needed.  As far as his psychiatric condition this patient has a serious long-term problem obviously with depression.  Reviewed medicines and past history with him.  Talked about options for depression treatment.  In the short run I suggested we continue increasing his Luvox dose as he is tolerating that fine going up to 300 mg and that we start him on Seroquel which he tolerated and got good effect from in the past at 100 mg at night.  Stop the Rexulti.  He is asking for Xanax which I would prefer not to go with at this point.  Involve patient in groups and activities.  We will need to make sure he has appropriate outpatient treatment at discharge.  Observation Level/Precautions:  15 minute checks  Laboratory:  Chemistry Profile  Psychotherapy:    Medications:    Consultations:    Discharge Concerns:    Estimated LOS:  Other:     Physician Treatment Plan for Primary Diagnosis: Major depressive disorder, recurrent severe without psychotic features ([place]) Long Term Goal(s): Improvement in symptoms so as ready for discharge  Short Term Goals: Ability to disclose and discuss suicidal ideas, Ability to demonstrate self-control will improve and Ability to identify and develop effective coping behaviors will  improve  Physician Treatment Plan for Secondary Diagnosis: Principal Problem:   Major depressive disorder, recurrent severe without psychotic features ([place]) Active Problems:   Hypothyroidism   HTN (hypertension)   Dyslipidemia   GERD (gastroesophageal reflux disease)   Type 1 diabetes mellitus ([place])   Toxic effect of petroleum products, intentional self-harm, sequela ([place])   Suicide attempt ([place])   Tracheostomy in place [facility])  Long Term Goal(s): Improvement in symptoms so as ready for discharge  Short Term Goals: Ability to maintain clinical measurements within normal limits will improve and Compliance with prescribed medications will improve  I certify that inpatient services furnished can reasonably be expected to improve the patient's condition.    Alethia Berthold, MD 8/26/20201:08 PM
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
## 2019-01-01 NOTE — BHH Group Notes (Signed)
LCSW Group Therapy Note  01/01/2019 1:00 PM  Type of Therapy/Topic:  Group Therapy:  Emotion Regulation  Participation Level:  Minimal   Description of Group:   The purpose of this group is to assist patients in learning to regulate negative emotions and experience positive emotions. Patients will be guided to discuss ways in which they have been vulnerable to their negative emotions. These vulnerabilities will be juxtaposed with experiences of positive emotions or situations, and patients will be challenged to use positive emotions to combat negative ones. Special emphasis will be placed on coping with negative emotions in conflict situations, and patients will process healthy conflict resolution skills.  Therapeutic Goals: 1. Patient will identify two positive emotions or experiences to reflect on in order to balance out negative emotions 2. Patient will label two or more emotions that they find the most difficult to experience 3. Patient will demonstrate positive conflict resolution skills through discussion and/or role plays  Summary of Patient Progress: Patient was present for group and active while present.  Patient shared how he has struggled with managing his emotions.  Patient also shared how he has utilized coping skills effectively as well.    Therapeutic Modalities:   Cognitive Behavioral Therapy Feelings Identification Dialectical Behavioral Therapy  Assunta Curtis, MSW, LCSW 01/01/2019 12:40 PM

## 2019-01-01 NOTE — Tx Team (Signed)
Initial Treatment Plan 01/01/2019 7:02 AM [name] [id number]    PATIENT STRESSORS: Health problems Medication change or noncompliance Traumatic event   PATIENT STRENGTHS: Communication skills General fund of knowledge Motivation for treatment/growth   PATIENT IDENTIFIED PROBLEMS: Suicidal ideation     DEpression                 DISCHARGE CRITERIA:  Improved stabilization in mood, thinking, and/or behavior Motivation to continue treatment in a less acute level of care  PRELIMINARY DISCHARGE PLAN: Outpatient therapy  PATIENT/FAMILY INVOLVEMENT: This treatment plan has been presented to and reviewed with the patient, [name],  The patient and family have been given the opportunity to ask questions and make suggestions.  Ailene Rud [name], RN 01/01/2019, 7:02 AM

## 2019-01-01 NOTE — Progress Notes (Signed)
Inpatient Diabetes Program Recommendations  AACE/ADA: New Consensus Statement on Inpatient Glycemic Control   Target Ranges:  Prepandial:   less than 140 mg/dL      Peak postprandial:   less than 180 mg/dL (1-2 hours)      Critically ill patients:  140 - 180 mg/dL   Results for [name], [name] (MRN [id number]) as of 01/01/2019 07:37  Ref. Range 12/31/2018 22:07 12/31/2018 22:25 12/31/2018 22:43 12/31/2018 23:56 01/01/2019 06:51  Glucose-Capillary Latest Ref Range: 70 - 99 mg/dL 53 (L) 57 (L) 87 121 (H) 488 (H)   Review of Glycemic Control  Diabetes history: DM1 (makes NO insulin; requires basal, correction, and meal coverage insulin) Outpatient Diabetes medications: T-slim Insulin Pump with Novolog, Jardiance 25 mg daily Current orders for Inpatient glycemic control: Novolog 0-15 units TID with meals  Inpatient Diabetes Program Recommendations:   Labs: Recommend STAT BMET to determine if patient is in DKA since insulin pump removed on 12/31/18 around 10:25 pm (per chart). IF patient is in DKA, he will need to be transferred to ICU/Stepdown for IV insulin.  SQ insulin: IF patient is NOT in DKA, recommend consulting hospitalist and ordering Lantus 25 units Q24H, add Novolog 0-5 units QHS, and Novolog 6 units TID with meals for meal coverage if patient eats at least 50% of meals.  NOTE: In reviewing chart, noted patient has DM59 (dx at 58 years old in 1973) and is followed by Dr. Honor Junes. Per Care Everywhere, patient last seen Dr. Honor Junes on 12/04/18 (started on Dexcom continuous glucose sensor at that time). Noted on office visit note by Dr. Honor Junes on 11/14/18 the following should be patient's insulin pump settings:  Basal insulin  12A 1.25 units/hour 12P 1.35 units/hour Total daily basal insulin: 31.2 units/24 hours  Carb Coverage 1:5 1 unit for every 5 grams of carbohydrates  Insulin Sensitivity 1:20 1 unit drops blood glucose 20 mg/dl  Target Glucose Goals 110 mg/dl  Per chart,  patient's insulin pump was removed last night around 10:25 pm. Since patient does not make any insulin and his insulin pump was removed, patient is very high risk for DKA since the insulin in the insulin pump is short acting insulin and he was not given any insulin when pump was removed. Recommend checking STAT BMET and if patient is in DKA, consult hospitalist and transfer to ICU/Stepdown for IV insulin drip. IF patient is NOT in DKA, recommend consulting hospitalist, ordering Lantus 25 units Q24H, Novolog 6 units TID with meals for meal coverage, and adding Novolog 0-5 units QHS for bedtime correction. Sent secure chat to Dr. Weber Cooks at 7:57 am.  Thanks, Barnie Alderman, RN, MSN, CDE Diabetes Coordinator Inpatient Diabetes Program [phone] (Team Pager from 8am to 5pm)

## 2019-01-01 NOTE — Plan of Care (Signed)
Patient is adjusting well to the unit. Has been visible in the milieu. Ate his meals and received all medications as scheduled. CBG 235 at dinner  time. Insulin administered as recommended. Pleasant and cooperative. No major concern throughout the shift.

## 2019-01-02 LAB — GLUCOSE, CAPILLARY
Glucose-Capillary: 319 mg/dL — ABNORMAL HIGH (ref 70–99)
Glucose-Capillary: 236 mg/dL — ABNORMAL HIGH (ref 70–99)
Glucose-Capillary: 159 mg/dL — ABNORMAL HIGH (ref 70–99)
Glucose-Capillary: 270 mg/dL — ABNORMAL HIGH (ref 70–99)

## 2019-01-02 MED ORDER — INSULIN ASPART 100 UNIT/ML ~~LOC~~ SOLN
4.0000 [IU] | Freq: Once | SUBCUTANEOUS | Status: AC
  Administered 2019-01-02: 4 [IU] via SUBCUTANEOUS
  Filled 2019-01-02: qty 1

## 2019-01-02 MED ORDER — INSULIN ASPART 100 UNIT/ML ~~LOC~~ SOLN
10.0000 [IU] | Freq: Three times a day (TID) | SUBCUTANEOUS | Status: DC
  Administered 2019-01-02 – 2019-01-03 (×3): 10 [IU] via SUBCUTANEOUS
  Filled 2019-01-02 (×3): qty 1

## 2019-01-02 MED ORDER — INSULIN GLARGINE 100 UNIT/ML ~~LOC~~ SOLN
30.0000 [IU] | Freq: Every day | SUBCUTANEOUS | Status: DC
  Administered 2019-01-03: 30 [IU] via SUBCUTANEOUS
  Filled 2019-01-02 (×2): qty 0.3

## 2019-01-02 MED ORDER — HYDROCORTISONE 1 % EX CREA
TOPICAL_CREAM | Freq: Two times a day (BID) | CUTANEOUS | Status: DC
  Administered 2019-01-02 – 2019-01-06 (×9): via TOPICAL
  Filled 2019-01-02: qty 28

## 2019-01-02 MED ORDER — FLUVOXAMINE MALEATE 50 MG PO TABS
150.0000 mg | ORAL_TABLET | Freq: Two times a day (BID) | ORAL | Status: DC
  Administered 2019-01-02 – 2019-01-03 (×2): 150 mg via ORAL
  Filled 2019-01-02 (×2): qty 3

## 2019-01-02 NOTE — Progress Notes (Signed)
Patient alert and oriented x 4, affect is flat but he brightens upon approach, no distress noted, airway is patent , no distress noted lung sounds clear to ascultation, symmetrical lung expansion, his thoughts are organized , he appears less anxious, interacting appropriately with peers and staff, complaint with medication regimen, 15 minutes safety checks maintained will continue to monitor.

## 2019-01-02 NOTE — Progress Notes (Signed)
Inpatient Diabetes Program Recommendations  AACE/ADA: New Consensus Statement on Inpatient Glycemic Control  Target Ranges:  Prepandial:   less than 140 mg/dL      Peak postprandial:   less than 180 mg/dL (1-2 hours)      Critically ill patients:  140 - 180 mg/dL   Results for CHANGA, WILLINGER (MRN [id number]) as of 01/02/2019 07:51  Ref. Range 01/01/2019 08:09 01/01/2019 11:04 01/01/2019 16:21 01/01/2019 20:35 01/02/2019 07:17  Glucose-Capillary Latest Ref Range: 70 - 99 mg/dL 411 (H) 311 (H) 235 (H) 130 (H) 319 (H)   Review of Glycemic Control  Diabetes history: DM1 (makes NO insulin; requires basal, correction, and meal coverage insulin) Outpatient Diabetes medications: T-slim Insulin Pump with Novolog, Jardiance 25 mg daily Current orders for Inpatient glycemic control: Lantus 25 units daily, Novolog 6 units TID with meals, Novolog 0-20 units TID with meals  Inpatient Diabetes Program Recommendations:   Insulin-Basal: Please consider increasing Lantus to 30 units daily.  Insulin-Meal Coverage: Please consider increasing meal coverage to Novolog 10 units TID with meals if patient eats at least 50% of meals.  Insulin-Correction: Please consider ordering Novolog 0-5 units QHS for bedtime correction.  Thanks, Barnie Alderman, RN, MSN, CDE Diabetes Coordinator Inpatient Diabetes Program [phone] (Team Pager from 8am to 5pm)

## 2019-01-02 NOTE — Progress Notes (Signed)
Recreation Therapy Notes  Date: 01/02/2019  Time: 9:30 am   Location: Craft room   Behavioral response: N/A   Intervention Topic: Coping Skills  Discussion/Intervention: Patient did not attend group.   Clinical Observations/Feedback:  Patient did not attend group.   Willoughby Doell LRT/CTRS         Johnatha Zeidman 01/02/2019 10:43 AM

## 2019-01-02 NOTE — Progress Notes (Signed)
Recreation Therapy Notes  INPATIENT RECREATION THERAPY ASSESSMENT  Patient Details Name: [name] MRN: [id number] DOB: 1960-11-27 Today's Date: 01/02/2019       Information Obtained From: Patient  Able to Participate in Assessment/Interview: Yes  Patient Presentation: Responsive  Reason for Admission (Per Patient): Active Symptoms, Suicidal Ideation, Suicide Attempt  Patient Stressors:    Coping Skills:   Arguments  Leisure Interests (2+):  Nature - Fishing, Therapist, music - Product manager of Recreation/Participation:    Awareness of Community Resources:     [employer]:     Current Use:    If no, Barriers?:    Expressed Interest in [employer] Information:    [place] of Residence:  Insurance underwriter  Patient Main Form of Transportation: Musician  Patient Strengths:  N/A  Patient Identified Areas of Improvement:  N/A  Patient Goal for Hospitalization:  Get medication right  Current SI (including self-harm):  No  Current HI:  No  Current AVH: No  Staff Intervention Plan: Group Attendance, Collaborate with Interdisciplinary Treatment Team  Consent to Intern Participation: N/A  [name] 01/02/2019, 12:02 PM

## 2019-01-02 NOTE — Plan of Care (Signed)
Patient stated that her depression and anxiety is 8/10.Denies SI,HI and AVH.Patient is appropriate with staff & peers.Patient stated that he could not sleep last night.Patient took naps between meals.Compliant with medications.Appetite and energy level good.Support and encouragement given.

## 2019-01-02 NOTE — Progress Notes (Signed)
[facility] MD Progress Note  01/02/2019 2:50 PM [name]  MRN:  [id number] Subjective: Patient seen for follow-up.  Today in the afternoon he said he was tired.  He was laying in bed sleeping.  Somewhat difficult to arouse.  He is having more secretions in his throat and indicates that he is not feeling well.  Blood sugars are continuing to run high.  He does get up at times and has been eating and participating a little bit in therapy. Principal Problem: Major depressive disorder, recurrent severe without psychotic features ([place]) Diagnosis: Principal Problem:   Major depressive disorder, recurrent severe without psychotic features ([place]) Active Problems:   Hypothyroidism   HTN (hypertension)   Dyslipidemia   GERD (gastroesophageal reflux disease)   Type 1 diabetes mellitus (HCC)   Toxic effect of petroleum products, intentional self-harm, sequela ([place])   Suicide attempt ([place])   Tracheostomy in place [facility])  Total Time spent with patient: 30 minutes  Past Psychiatric History: History of chronic severe depression with recurrent suicidality  Past Medical History:  Past Medical History:  Diagnosis Date  . Depression   . Diabetes (HCC)    Insulin Pump  . Diabetes mellitus type I ([place])   . Diabetes mellitus without complication ([place])   . GERD (gastroesophageal reflux disease)   . H/O laryngectomy   . Heel bone fracture   . Hyperlipidemia   . Hypertension   . Radicular pain of right lower extremity   . Stroke ([place])   . Suicide attempt [facility]) 2014   damaged larynx - tracheostomy  . Thyroid disease     Past Surgical History:  Procedure Laterality Date  . COLONOSCOPY WITH PROPOFOL N/A 05/15/2018   Procedure: COLONOSCOPY WITH PROPOFOL;  Surgeon: [name], [name], MD;  Location: [facility] ENDOSCOPY;  Service: Gastroenterology;  Laterality: N/A;  . ESOPHAGOGASTRODUODENOSCOPY N/A 05/15/2018   Procedure: ESOPHAGOGASTRODUODENOSCOPY (EGD);  Surgeon: [name], [name], MD;  Location: [facility]  ENDOSCOPY;  Service: Gastroenterology;  Laterality: N/A;  . FRACTURE SURGERY    . Heel bone reconstruction Left   . HERNIA REPAIR  [id number]   Umbilical hernia repair   . LARYNGECTOMY    . NECK SURGERY     fusion  . SPINE SURGERY    . TRACHEOSTOMY  2014   from SI attempt   Family History:  Family History  Problem Relation Age of Onset  . Osteoporosis Mother   . Diabetes Mother   . Hypertension Father    Family Psychiatric  History: See previous Social History:  Social History   Substance and Sexual Activity  Alcohol Use Yes  . Alcohol/week: 2.0 standard drinks  . Types: 2 Shots of liquor per week   Comment: rare     Social History   Substance and Sexual Activity  Drug Use No   Comment: Pt denied; UDS not available    Social History   Socioeconomic History  . Marital status: Married    Spouse name: Not on file  . Number of children: Not on file  . Years of education: Not on file  . Highest education level: Not on file  Occupational History  . Not on file  Social Needs  . Financial resource strain: Not on file  . Food insecurity    Worry: Not on file    Inability: Not on file  . Transportation needs    Medical: Not on file    Non-medical: Not on file  Tobacco Use  . Smoking status: Former Smoker  Packs/day: 0.00    Types: Cigarettes    Quit date: 04/09/2013    Years since quitting: 5.7  . Smokeless tobacco: Never Used  Substance and Sexual Activity  . Alcohol use: Yes    Alcohol/week: 2.0 standard drinks    Types: 2 Shots of liquor per week    Comment: rare  . Drug use: No    Comment: Pt denied; UDS not available  . Sexual activity: Yes    Partners: Female    Birth control/protection: Condom  Lifestyle  . Physical activity    Days per week: Not on file    Minutes per session: Not on file  . Stress: Not on file  Relationships  . Social Herbalist on phone: Not on file    Gets together: Not on file    Attends religious service: Not  on file    Active member of club or organization: Not on file    Attends meetings of clubs or organizations: Not on file    Relationship status: Not on file  Other Topics Concern  . Not on file  Social History Narrative  . Not on file   Additional Social History:                         Sleep: Fair  Appetite:  Fair  Current Medications: Current Facility-Administered Medications  Medication Dose Route Frequency Provider Last Rate Last Dose  . amLODipine (NORVASC) tablet 5 mg  5 mg Oral Daily Makoto Sellitto, [name], MD   5 mg at 01/02/19 0653  . aspirin EC tablet 81 mg  81 mg Oral Daily Timira Bieda, [name], MD   81 mg at 01/02/19 0809  . diphenhydrAMINE (BENADRYL) capsule 50 mg  50 mg Oral Q6H PRN [name], NP   50 mg at 01/01/19 2107  . fluvoxaMINE (LUVOX) tablet 150 mg  150 mg Oral BID Earlean Fidalgo T, MD      . hydrochlorothiazide (HYDRODIURIL) tablet 25 mg  25 mg Oral Daily Meshia Rau, [name], MD   25 mg at 01/02/19 [phone]  . insulin aspart (novoLOG) injection 0-20 Units  0-20 Units Subcutaneous TID WC Murl Golladay, [name], MD   7 Units at 01/02/19 1159  . insulin aspart (novoLOG) injection 10 Units  10 Units Subcutaneous TID WC Nizhoni Parlow T, MD      . Derrill Memo ON 01/03/2019] insulin glargine (LANTUS) injection 30 Units  30 Units Subcutaneous Daily Agustus Mane T, MD      . levothyroxine (SYNTHROID) tablet 75 mcg  75 mcg Oral Q0600 Hyrum Shaneyfelt, [name], MD   75 mcg at 01/02/19 0654  . losartan (COZAAR) tablet 100 mg  100 mg Oral Daily Elyssa Pendelton, [name], MD   100 mg at 01/02/19 0809  . pantoprazole (PROTONIX) EC tablet 40 mg  40 mg Oral Daily Azaleah Usman, [name], MD   40 mg at 01/02/19 0809  . promethazine (PHENERGAN) tablet 25 mg  25 mg Oral Q6H PRN Sharlynn Seckinger, [name], MD   25 mg at 01/01/19 1329  . QUEtiapine (SEROQUEL) tablet 100 mg  100 mg Oral QHS Nazariah Cadet, [name], MD   100 mg at 01/01/19 2108  . simvastatin (ZOCOR) tablet 20 mg  20 mg Oral q1800 Rubena Roseman, [name], MD   20 mg at 01/01/19 1648   . traZODone (DESYREL) tablet 100 mg  100 mg Oral QHS [name], NP   100 mg at 01/01/19 2108  Lab Results:  Results for orders placed or performed during the hospital encounter of 12/31/18 (from the past 48 hour(s))  Glucose, capillary     Status: Abnormal   Collection Time: 12/31/18 11:56 PM  Result Value Ref Range   Glucose-Capillary 121 (H) 70 - 99 mg/dL  Glucose, capillary     Status: Abnormal   Collection Time: 01/01/19  6:51 AM  Result Value Ref Range   Glucose-Capillary 488 (H) 70 - 99 mg/dL  Glucose, capillary     Status: Abnormal   Collection Time: 01/01/19  8:09 AM  Result Value Ref Range   Glucose-Capillary 411 (H) 70 - 99 mg/dL  Basic metabolic panel     Status: Abnormal   Collection Time: 01/01/19  9:21 AM  Result Value Ref Range   Sodium 133 (L) 135 - 145 mmol/L   Potassium 3.9 3.5 - 5.1 mmol/L   Chloride 100 98 - 111 mmol/L   CO2 21 (L) 22 - 32 mmol/L   Glucose, Bld 499 (H) 70 - 99 mg/dL   BUN 34 (H) 6 - 20 mg/dL   Creatinine, Ser 1.92 (H) 0.61 - 1.24 mg/dL   Calcium 9.1 8.9 - 10.3 mg/dL   GFR calc non Af Amer 38 (L) >60 mL/min   GFR calc Af Amer 44 (L) >60 mL/min   Anion gap 12 5 - 15    Comment: Performed at [facility], [address] [place], [place] [address]  Hemoglobin A1c     Status: Abnormal   Collection Time: 01/01/19  9:21 AM  Result Value Ref Range   Hgb A1c MFr Bld 7.8 (H) 4.8 - 5.6 %    Comment: (NOTE) Pre diabetes:          5.7%-6.4% Diabetes:              >6.4% Glycemic control for   <7.0% adults with diabetes    Mean Plasma Glucose 177.16 mg/dL    Comment: Performed at [facility], [address] [place], [place] [address]  Glucose, capillary     Status: Abnormal   Collection Time: 01/01/19 11:04 AM  Result Value Ref Range   Glucose-Capillary 311 (H) 70 - 99 mg/dL  Glucose, capillary     Status: Abnormal   Collection Time: 01/01/19  4:21 PM  Result Value Ref Range   Glucose-Capillary 235 (H) 70 - 99  mg/dL  Glucose, capillary     Status: Abnormal   Collection Time: 01/01/19  8:35 PM  Result Value Ref Range   Glucose-Capillary 130 (H) 70 - 99 mg/dL   Comment 1 Notify RN   Glucose, capillary     Status: Abnormal   Collection Time: 01/02/19  7:17 AM  Result Value Ref Range   Glucose-Capillary 319 (H) 70 - 99 mg/dL   Comment 1 Notify RN   Glucose, capillary     Status: Abnormal   Collection Time: 01/02/19 11:38 AM  Result Value Ref Range   Glucose-Capillary 236 (H) 70 - 99 mg/dL    Blood Alcohol level:  Lab Results  Component Value Date   ETH <10 12/31/2018   ETH <10 123XX123    Metabolic Disorder Labs: Lab Results  Component Value Date   HGBA1C 7.8 (H) 01/01/2019   MPG 177.16 01/01/2019   MPG 174.29 12/23/2018   No results found for: PROLACTIN Lab Results  Component Value Date   CHOL 161 09/15/2016   TRIG 190 (H) 09/15/2016   HDL 41 09/15/2016  CHOLHDL 3.9 09/15/2016   VLDL 38 09/15/2016   LDLCALC 82 09/15/2016    Physical Findings: AIMS:  , ,  ,  ,    CIWA:    COWS:     Musculoskeletal: Strength & Muscle Tone: within normal limits Gait & Station: normal Patient leans: N/A  Psychiatric Specialty Exam: Physical Exam  Nursing note and vitals reviewed. Constitutional: He appears well-developed and well-nourished.  HENT:  Head: Normocephalic and atraumatic.  Eyes: Pupils are equal, round, and reactive to light. Conjunctivae are normal.  Neck: Normal range of motion.  Cardiovascular: Regular rhythm and normal heart sounds.  Respiratory: Effort normal.      GI: Soft.  Musculoskeletal: Normal range of motion.  Neurological: He is alert.  Skin: Skin is warm and dry.  Psychiatric: Judgment normal. His affect is blunt. His speech is delayed. He is slowed. Cognition and memory are impaired. He expresses no suicidal ideation.    Review of Systems  Constitutional: Positive for malaise/fatigue.  HENT: Negative.   Eyes: Negative.   Respiratory:  Negative.   Cardiovascular: Negative.   Gastrointestinal: Negative.   Musculoskeletal: Negative.   Skin: Negative.   Neurological: Negative.   Psychiatric/Behavioral: Positive for depression and suicidal ideas. Negative for hallucinations, memory loss and substance abuse. The patient is nervous/anxious. The patient does not have insomnia.     Blood pressure (!) 160/82, pulse 89, temperature 98.3 F (36.8 C), temperature source Oral, resp. rate 18, height 6' (1.829 m), weight 108.9 kg, SpO2 100 %.Body mass index is 32.55 kg/m.  General Appearance: Casual  Eye Contact:  Minimal  Speech:  Slow  Volume:  Decreased  Mood:  Dysphoric  Affect:  Congruent  Thought Process:  Coherent  Orientation:  Full (Time, Place, and Person)  Thought Content:  Logical  Suicidal Thoughts:  Yes.  without intent/plan  Homicidal Thoughts:  No  Memory:  Immediate;   Fair Recent;   Fair Remote;   Fair  Judgement:  Fair  Insight:  Fair  Psychomotor Activity:  Decreased  Concentration:  Concentration: Fair  Recall:  [employer] of Knowledge:  Fair  Language:  Fair  Akathisia:  No  Handed:  Right  AIMS (if indicated):     Assets:  Housing Resilience  ADL's:  Intact  Cognition:  WNL  Sleep:  Number of Hours: 7.5     Treatment Plan Summary: Daily contact with patient to assess and evaluate symptoms and progress in treatment, Medication management and Plan Patient may be getting fatigued because of changes in medicine and may be getting fatigued because of his being high.  Also could be feeling sick.  Tested negative for coronavirus when he came down to the unit.  Has enough medical problems that it can be hard to tell when a new condition is coming on.  In any case I will continue to follow the diabetes coordinators recommendation by increasing his insulin doses.  Patient has a suction machine available and knows how to use it.  Changing medicine to Luvox 150 twice a day instead of all in the morning.   Encourage participation in groups.  Continue monitoring vitals and blood sugars.  Alethia Berthold, MD 01/02/2019, 2:50 PM

## 2019-01-02 NOTE — Progress Notes (Signed)
Recreation Therapy Notes  Date: 01/02/2019  Time: 9:30 am   Location: Craft room   Behavioral response: N/A   Intervention Topic: Happiness   Discussion/Intervention: Patient did not attend group.   Clinical Observations/Feedback:  Patient did not attend group.   Ebbie Cherry LRT/CTRS         [name] 01/02/2019 10:42 AM

## 2019-01-02 NOTE — Progress Notes (Signed)
Family brought the suction canister and tubings,kept at the nurses station.

## 2019-01-02 NOTE — [facility] Group Notes (Signed)
[facility] Group Notes:  (Nursing/MHT/Case Management/Adjunct)  Date:  01/02/2019  Time:  8:38 PM  Type of Therapy:  Group Therapy  Participation Level:  Active  Participation Quality:  Appropriate  Affect:  Appropriate  Cognitive:  Alert  Insight:  Good  Engagement in Group:  Engaged  Modes of Intervention:  Support  Summary of Progress/Problems:  [name] 01/02/2019, 8:38 PM

## 2019-01-02 NOTE — BHH Group Notes (Signed)
LCSW Group Therapy Note  01/02/2019 12:26 PM  Type of Therapy/Topic:  Group Therapy:  Balance in Life  Participation Level:  Did Not Attend  Description of Group:    This group will address the concept of balance and how it feels and looks when one is unbalanced. Patients will be encouraged to process areas in their lives that are out of balance and identify reasons for remaining unbalanced. Facilitators will guide patients in utilizing problem-solving interventions to address and correct the stressor making their life unbalanced. Understanding and applying boundaries will be explored and addressed for obtaining and maintaining a balanced life. Patients will be encouraged to explore ways to assertively make their unbalanced needs known to significant others in their lives, using other group members and facilitator for support and feedback.  Therapeutic Goals: 1. Patient will identify two or more emotions or situations they have that consume much of in their lives. 2. Patient will identify signs/triggers that life has become out of balance:  3. Patient will identify two ways to set boundaries in order to achieve balance in their lives:  4. Patient will demonstrate ability to communicate their needs through discussion and/or role plays  Summary of Patient Progress: x     Therapeutic Modalities:   Cognitive Behavioral Therapy Solution-Focused Therapy Assertiveness Training  Evalina Field, MSW, LCSW Clinical Social Work 01/02/2019 12:26 PM

## 2019-01-02 NOTE — Progress Notes (Signed)
Patient asked for suction machine which family brought.Found the suction machine at the nurses station with no canister and tubings.Patient cleaned the canula with the brush.Patient stated that he is ok and does not need suction now.

## 2019-01-03 LAB — GLUCOSE, CAPILLARY
Glucose-Capillary: 176 mg/dL — ABNORMAL HIGH (ref 70–99)
Glucose-Capillary: 331 mg/dL — ABNORMAL HIGH (ref 70–99)
Glucose-Capillary: 76 mg/dL (ref 70–99)
Glucose-Capillary: 98 mg/dL (ref 70–99)
Glucose-Capillary: 240 mg/dL — ABNORMAL HIGH (ref 70–99)

## 2019-01-03 MED ORDER — QUETIAPINE FUMARATE 200 MG PO TABS
200.0000 mg | ORAL_TABLET | Freq: Every day | ORAL | Status: DC
  Administered 2019-01-03 – 2019-01-06 (×4): 200 mg via ORAL
  Filled 2019-01-03 (×4): qty 1

## 2019-01-03 MED ORDER — CITALOPRAM HYDROBROMIDE 20 MG PO TABS
20.0000 mg | ORAL_TABLET | Freq: Every day | ORAL | Status: DC
  Administered 2019-01-03 – 2019-01-07 (×5): 20 mg via ORAL
  Filled 2019-01-03 (×5): qty 1

## 2019-01-03 MED ORDER — INSULIN ASPART 100 UNIT/ML ~~LOC~~ SOLN: 15.0000 [IU] | Freq: Three times a day (TID) | SUBCUTANEOUS | Status: DC

## 2019-01-03 MED ORDER — FLUVOXAMINE MALEATE 50 MG PO TABS: 150.0000 mg | ORAL_TABLET | Freq: Every day | ORAL | Status: DC

## 2019-01-03 MED ORDER — TRAMADOL HCL 50 MG PO TABS
100.0000 mg | ORAL_TABLET | Freq: Four times a day (QID) | ORAL | Status: DC | PRN
  Administered 2019-01-03 – 2019-01-07 (×8): 100 mg via ORAL
  Filled 2019-01-03 (×8): qty 2

## 2019-01-03 MED ORDER — INSULIN GLARGINE 100 UNIT/ML ~~LOC~~ SOLN
25.0000 [IU] | Freq: Every day | SUBCUTANEOUS | Status: DC
  Administered 2019-01-04 – 2019-01-07 (×4): 25 [IU] via SUBCUTANEOUS
  Filled 2019-01-03 (×4): qty 0.25

## 2019-01-03 NOTE — Plan of Care (Signed)
Patient rated his depression and anxiety 10/10.Verbalized passive suicidal thoughts and low energy level.Contracts for safety in the hospital.Compliant with medications.Did not attend groups.Blood sugar 78 at dinner time notified Dr.Clapacs.Patient verbalized that his physical health made him depressed.Support and encouragement given.

## 2019-01-03 NOTE — Progress Notes (Signed)
[name] is a 58 y.o.  male patient presented to [facility] ED voluntarily. A patient who attempted suicide in 2014 intentionally ingesting 30 Percocet tablets.  Nursing notes and past provider notes were reviewed.  This provider received a call from the RN on the inpatient unit, stating that the patient [name]) blood glucose was 270 mg/dl.  The patient voiced that he felt shaky, anxious, and needed something to assist with his blood sugar.   Before his admission, he was on an insulin pump and was managing his blood glucose appropriately.  The patient started with a blood sugar of 270; he would give himself some insulin.  A one-time order was placed for him to have four units of all of his aspart insulin.

## 2019-01-03 NOTE — Progress Notes (Signed)
Inpatient Diabetes Program Recommendations  AACE/ADA: New Consensus Statement on Inpatient Glycemic Control (2015)  Target Ranges:  Prepandial:   less than 140 mg/dL      Peak postprandial:   less than 180 mg/dL (1-2 hours)      Critically ill patients:  140 - 180 mg/dL   Results for EMILO, LOIACONO (MRN [id number]) as of 01/03/2019 13:14  Ref. Range 01/02/2019 07:17 01/02/2019 11:38 01/02/2019 16:18 01/02/2019 20:09  Glucose-Capillary Latest Ref Range: 70 - 99 mg/dL 319 (H)  21 units NOVOLOG +  25 units LANTUS  236 (H)  13 units NOVOLOG  159 (H)  14 units NOVOLOG  270 (H)  4 units NOVOLOG    Results for ROWLAND, SOONG (MRN [id number]) as of 01/03/2019 13:14  Ref. Range 01/03/2019 07:04 01/03/2019 11:55  Glucose-Capillary Latest Ref Range: 70 - 99 mg/dL 176 (H)  14 units NOVOLOG +  30 units LANTUS  331 (H)  25 units NOVOLOG       History: Type 1 Diabetes (makes NO insulin; requires basal, correction, and meal coverage insulin)  Home DM Meds: Insulin Pump       Jardiance 25 mg Daily  Current Orders: Lantus 30 units Daily      Novolog Resistant Correction Scale/ SSI (0-20 units) TID AC      Novolog 10 units TID with meals      MD- Note Lantus increased to 30 units Daily today.  Patient still having elevated post-meal CBGs.  Please consider the following:  Increase Novolog Meal Coverage to: Novolog 12 units TID with meals     --Will follow patient during hospitalization--  Wyn Quaker RN, MSN, CDE Diabetes Coordinator Inpatient Glycemic Control Team Team Pager: [phone] (8a-5p)

## 2019-01-03 NOTE — Progress Notes (Signed)
Patient alert and oriented x 4, affect is bright upon approach, his thoughts are organized and coherent, he has a trache suction machine at bed side, patient independently can perform suction and this was observed by Probation officer. Patient makes apprpriate eye contact he denies SI/HI/AVH no distress noted, interacting appropriately with peers and staff, will continue to monitor.

## 2019-01-03 NOTE — Progress Notes (Signed)
[facility] MD Progress Note  01/03/2019 3:39 PM [name]  MRN:  [id number] Subjective: Follow-up for this patient with major depression who made a serious suicide attempt.  Patient also has multiple significant ongoing medical problems.  On interview today the patient reports that his mood is not improved.  He continues to feel sad depressed and hopeless and has passive suicidal thoughts with no intent of acting on them here in the hospital.  Energy level is low.  Patient's blood sugars continue to be poorly controlled despite increased doses of insulin.  He also produces a lot of sputum around his tracheostomy which she says is typical for him.  He spends a lot of time in bed sleeping.  Not going to groups very much.  No evidence of psychosis. Principal Problem: Major depressive disorder, recurrent severe without psychotic features ([place]) Diagnosis: Principal Problem:   Major depressive disorder, recurrent severe without psychotic features ([place]) Active Problems:   Hypothyroidism   HTN (hypertension)   Dyslipidemia   GERD (gastroesophageal reflux disease)   Type 1 diabetes mellitus ([place])   Toxic effect of petroleum products, intentional self-harm, sequela ([place])   Suicide attempt ([place])   Tracheostomy in place [facility])  Total Time spent with patient: 30 minutes  Past Psychiatric History: Patient has a long history of recurrent depression and multiple previous suicide attempts  Past Medical History:  Past Medical History:  Diagnosis Date   Depression    Diabetes ([place])    Insulin Pump   Diabetes mellitus type I ([place])    Diabetes mellitus without complication ([place])    GERD (gastroesophageal reflux disease)    H/O laryngectomy    Heel bone fracture    Hyperlipidemia    Hypertension    Radicular pain of right lower extremity    Stroke ([place])    Suicide attempt ([employer]) 2014   damaged larynx - tracheostomy   Thyroid disease     Past Surgical History:  Procedure Laterality  Date   COLONOSCOPY WITH PROPOFOL N/A 05/15/2018   Procedure: COLONOSCOPY WITH PROPOFOL;  Surgeon: [name], [name], MD;  Location: [facility] ENDOSCOPY;  Service: Gastroenterology;  Laterality: N/A;   ESOPHAGOGASTRODUODENOSCOPY N/A 05/15/2018   Procedure: ESOPHAGOGASTRODUODENOSCOPY (EGD);  Surgeon: [name], [name], MD;  Location: [facility] ENDOSCOPY;  Service: Gastroenterology;  Laterality: N/A;   FRACTURE SURGERY     Heel bone reconstruction Left    HERNIA REPAIR  [id number]   Umbilical hernia repair    LARYNGECTOMY     NECK SURGERY     fusion   SPINE SURGERY     TRACHEOSTOMY  2014   from [place] attempt   Family History:  Family History  Problem Relation Age of Onset   Osteoporosis Mother    Diabetes Mother    Hypertension Father    Family Psychiatric  History: See previous Social History:  Social History   Substance and Sexual Activity  Alcohol Use Yes   Alcohol/week: 2.0 standard drinks   Types: 2 Shots of liquor per week   Comment: rare     Social History   Substance and Sexual Activity  Drug Use No   Comment: Pt denied; UDS not available    Social History   Socioeconomic History   Marital status: Married    Spouse name: Not on file   Number of children: Not on file   Years of education: Not on file   Highest education level: Not on file  Occupational History   Not on file  Social  Needs   Financial resource strain: Not on file   Food insecurity    Worry: Not on file    Inability: Not on file   Transportation needs    Medical: Not on file    Non-medical: Not on file  Tobacco Use   Smoking status: Former Smoker    Packs/day: 0.00    Types: Cigarettes    Quit date: 04/09/2013    Years since quitting: 5.7   Smokeless tobacco: Never Used  Substance and Sexual Activity   Alcohol use: Yes    Alcohol/week: 2.0 standard drinks    Types: 2 Shots of liquor per week    Comment: rare   Drug use: No    Comment: Pt denied; UDS not available   Sexual  activity: Yes    Partners: Female    Birth control/protection: Condom  Lifestyle   Physical activity    Days per week: Not on file    Minutes per session: Not on file   Stress: Not on file  Relationships   Social connections    Talks on phone: Not on file    Gets together: Not on file    Attends religious service: Not on file    Active member of club or organization: Not on file    Attends meetings of clubs or organizations: Not on file    Relationship status: Not on file  Other Topics Concern   Not on file  Social History Narrative   Not on file   Additional Social History:                         Sleep: Fair  Appetite:  Fair  Current Medications: Current Facility-Administered Medications  Medication Dose Route Frequency Provider Last Rate Last Dose   amLODipine (NORVASC) tablet 5 mg  5 mg Oral Daily Dericka Ostenson, [name], MD   5 mg at 01/03/19 0749   aspirin EC tablet 81 mg  81 mg Oral Daily Pierson Vantol, [name], MD   81 mg at 01/03/19 0749   citalopram (CELEXA) tablet 20 mg  20 mg Oral Daily Jeily Guthridge, [name], MD       diphenhydrAMINE (BENADRYL) capsule 50 mg  50 mg Oral Q6H PRN [name], NP   50 mg at 01/02/19 1733   [START ON 01/04/2019] fluvoxaMINE (LUVOX) tablet 150 mg  150 mg Oral QHS Dreux Mcgroarty T, MD       hydrochlorothiazide (HYDRODIURIL) tablet 25 mg  25 mg Oral Daily Tylena Prisk T, MD   25 mg at 01/03/19 0748   hydrocortisone cream 1 %   Topical BID Margarine Grosshans T, MD       insulin aspart (novoLOG) injection 0-20 Units  0-20 Units Subcutaneous TID WC Ammara Raj T, MD   15 Units at 01/03/19 1205   insulin aspart (novoLOG) injection 15 Units  15 Units Subcutaneous TID WC Keely Drennan T, MD       insulin glargine (LANTUS) injection 30 Units  30 Units Subcutaneous Daily Benjamyn Hestand, [name], MD   30 Units at 01/03/19 0748   levothyroxine (SYNTHROID) tablet 75 mcg  75 mcg Oral Q0600 Kenyetta Fife, [name], MD   75 mcg at 01/03/19 0653   losartan  (COZAAR) tablet 100 mg  100 mg Oral Daily Jefte Carithers T, MD   100 mg at 01/03/19 0749   pantoprazole (PROTONIX) EC tablet 40 mg  40 mg Oral Daily Kamaron Deskins, [name], MD   40  mg at 01/03/19 0748   promethazine (PHENERGAN) tablet 25 mg  25 mg Oral Q6H PRN Mekayla Soman T, MD   25 mg at 01/01/19 1329   QUEtiapine (SEROQUEL) tablet 200 mg  200 mg Oral QHS Lucianna Ostlund T, MD       simvastatin (ZOCOR) tablet 20 mg  20 mg Oral q1800 Clementina Mareno T, MD   20 mg at 01/02/19 1646   traMADol (ULTRAM) tablet 100 mg  100 mg Oral Q6H PRN [place], [name], MD       traZODone (DESYREL) tablet 100 mg  100 mg Oral QHS [name], NP   100 mg at 01/02/19 2133    Lab Results:  Results for orders placed or performed during the hospital encounter of 12/31/18 (from the past 48 hour(s))  Glucose, capillary     Status: Abnormal   Collection Time: 01/01/19  4:21 PM  Result Value Ref Range   Glucose-Capillary 235 (H) 70 - 99 mg/dL  Glucose, capillary     Status: Abnormal   Collection Time: 01/01/19  8:35 PM  Result Value Ref Range   Glucose-Capillary 130 (H) 70 - 99 mg/dL   Comment 1 Notify RN   Glucose, capillary     Status: Abnormal   Collection Time: 01/02/19  7:17 AM  Result Value Ref Range   Glucose-Capillary 319 (H) 70 - 99 mg/dL   Comment 1 Notify RN   Glucose, capillary     Status: Abnormal   Collection Time: 01/02/19 11:38 AM  Result Value Ref Range   Glucose-Capillary 236 (H) 70 - 99 mg/dL  Glucose, capillary     Status: Abnormal   Collection Time: 01/02/19  4:18 PM  Result Value Ref Range   Glucose-Capillary 159 (H) 70 - 99 mg/dL  Glucose, capillary     Status: Abnormal   Collection Time: 01/02/19  8:09 PM  Result Value Ref Range   Glucose-Capillary 270 (H) 70 - 99 mg/dL   Comment 1 Notify RN   Glucose, capillary     Status: Abnormal   Collection Time: 01/03/19  7:04 AM  Result Value Ref Range   Glucose-Capillary 176 (H) 70 - 99 mg/dL   Comment 1 Notify RN   Glucose,  capillary     Status: Abnormal   Collection Time: 01/03/19 11:55 AM  Result Value Ref Range   Glucose-Capillary 331 (H) 70 - 99 mg/dL    Blood Alcohol level:  Lab Results  Component Value Date   ETH <10 12/31/2018   ETH <10 123XX123    Metabolic Disorder Labs: Lab Results  Component Value Date   HGBA1C 7.8 (H) 01/01/2019   MPG 177.16 01/01/2019   MPG 174.29 12/23/2018   No results found for: PROLACTIN Lab Results  Component Value Date   CHOL 161 09/15/2016   TRIG 190 (H) 09/15/2016   HDL 41 09/15/2016   CHOLHDL 3.9 09/15/2016   VLDL 38 09/15/2016   LDLCALC 82 09/15/2016    Physical Findings: AIMS:  , ,  ,  ,    CIWA:    COWS:     Musculoskeletal: Strength & Muscle Tone: within normal limits Gait & Station: normal Patient leans: N/A  Psychiatric Specialty Exam: Physical Exam  Nursing note and vitals reviewed. Constitutional: He appears well-developed and well-nourished.  HENT:  Head: Normocephalic and atraumatic.  Eyes: Pupils are equal, round, and reactive to light. Conjunctivae are normal.  Neck: Normal range of motion.  Cardiovascular: Regular rhythm and normal heart sounds.  Respiratory: Effort normal.      GI: Soft.  Musculoskeletal: Normal range of motion.  Neurological: He is alert.  Skin: Skin is warm and dry.  Psychiatric: Judgment normal. His speech is delayed. He is slowed and withdrawn. Cognition and memory are impaired. He exhibits a depressed mood. He expresses suicidal ideation. He expresses no suicidal plans.    Review of Systems  Constitutional: Negative.   HENT: Negative.   Eyes: Negative.   Respiratory: Positive for sputum production.   Cardiovascular: Negative.   Gastrointestinal: Negative.   Musculoskeletal: Negative.   Skin: Negative.   Neurological: Negative.   Psychiatric/Behavioral: Positive for depression and suicidal ideas. Negative for hallucinations, memory loss and substance abuse. The patient is nervous/anxious and  has insomnia.     Blood pressure (!) 166/86, pulse 78, temperature 98 F (36.7 C), temperature source Oral, resp. rate 18, height 6' (1.829 m), weight 108.9 kg, SpO2 98 %.Body mass index is 32.55 kg/m.  General Appearance: Disheveled  Eye Contact:  Fair  Speech:  Slow  Volume:  Decreased  Mood:  Depressed and Dysphoric  Affect:  Congruent  Thought Process:  Goal Directed  Orientation:  Full (Time, Place, and Person)  Thought Content:  Logical  Suicidal Thoughts:  Yes.  without intent/plan  Homicidal Thoughts:  No  Memory:  Immediate;   Fair Recent;   Fair Remote;   Fair  Judgement:  Fair  Insight:  Fair  Psychomotor Activity:  Decreased  Concentration:  Concentration: Fair  Recall:  [employer] of Knowledge:  Fair  Language:  Fair  Akathisia:  No  Handed:  Right  AIMS (if indicated):     Assets:  Desire for Improvement Housing Resilience  ADL's:  Impaired  Cognition:  Impaired,  Mild  Sleep:  Number of Hours: 7.5     Treatment Plan Summary: Daily contact with patient to assess and evaluate symptoms and progress in treatment, Medication management and Plan Patient with major depression continues to feel pretty depressed.  Albeit only a couple days after changing medicines.  We talked a while about what had been helpful in the past.  He did recall that citalopram had been helpful previously.  Rather therefore than increasing the Luvox I suggest we may be try switching over to citalopram.  I am cutting the Luvox dose down in half while starting citalopram 20 mg/day.  Also increasing Seroquel to 200 mg at nighttime to assist with depression.  I have increased his mealtime insulin to 15.  Continue regular checks of blood sugar.  Continue encouragement.  Supportive and educational counseling completed.  Alethia Berthold, MD 01/03/2019, 3:39 PM

## 2019-01-03 NOTE — Progress Notes (Signed)
Recreation Therapy Notes  Date: 01/03/2019  Time: 9:30 am   Location: Craft room   Behavioral response: N/A   Intervention Topic: Emotions    Discussion/Intervention: Patient did not attend group.   Clinical Observations/Feedback:  Patient did not attend group.   Jemina Scahill LRT/CTRS          Rickia [place] 01/03/2019 10:53 AM

## 2019-01-03 NOTE — Progress Notes (Signed)
Recreation Therapy Notes   Date: 01/03/2019  Time: 1:00 pm  Location: Craft room  Behavioral response: N/A  Group Type: Leisure  Participation level: N/A  Communication: Patient did not attend.  Comments: N/A  Geordie Nooney LRT/CTRS        Jajuan Skoog 01/03/2019 2:05 PM

## 2019-01-03 NOTE — Plan of Care (Signed)
  Problem: Education: Goal: Knowledge of [facility] Education information/materials will improve Outcome: Progressing Goal: Emotional status will improve Outcome: Progressing Goal: Mental status will improve Outcome: Progressing Goal: Verbalization of understanding the information provided will improve Outcome: Progressing  D: Patient has been suctioning his trach several times without difficulty. Denies SI, but states that he is depressed because of the tracheotomy. Has been fairly needy. Asking for medication dosages and wondering why his seroquel was not increased. Pleasant and cooperative. A: Continue to monitor for safety R: Safety maintained.

## 2019-01-03 NOTE — BHH Group Notes (Signed)
[facility] Group Notes:  (Nursing/MHT/Case Management/Adjunct)  Date:  01/03/2019  Time:  9:25 PM  Type of Therapy:  Wrap Up Group  Participation Level:  Active  Participation Quality:  Appropriate, Attentive and Sharing  Affect:  Appropriate  Cognitive:  Alert and Appropriate  Insight:  Appropriate  Engagement in Group:  Engaged  Modes of Intervention:  Discussion and Support  Summary of Progress/Problems:  Adela Lank [facility] 01/03/2019, 9:25 PM

## 2019-01-03 NOTE — Progress Notes (Signed)
D: Patient has been suctioning his trach several times without difficulty. Denies SI, but states that he is depressed because of the tracheotomy. Has been fairly needy. Asking for medication dosages and wondering why his seroquel was not increased. Pleasant and cooperative. A: Continue to monitor for safety R: Safety maintained.

## 2019-01-04 LAB — GLUCOSE, CAPILLARY
Glucose-Capillary: 177 mg/dL — ABNORMAL HIGH (ref 70–99)
Glucose-Capillary: 272 mg/dL — ABNORMAL HIGH (ref 70–99)
Glucose-Capillary: 228 mg/dL — ABNORMAL HIGH (ref 70–99)
Glucose-Capillary: 274 mg/dL — ABNORMAL HIGH (ref 70–99)

## 2019-01-04 MED ORDER — INSULIN ASPART 100 UNIT/ML ~~LOC~~ SOLN
5.0000 [IU] | Freq: Three times a day (TID) | SUBCUTANEOUS | Status: DC
  Administered 2019-01-04 – 2019-01-07 (×10): 5 [IU] via SUBCUTANEOUS
  Filled 2019-01-04 (×7): qty 1

## 2019-01-04 MED ORDER — FLUVOXAMINE MALEATE 50 MG PO TABS
50.0000 mg | ORAL_TABLET | Freq: Every day | ORAL | Status: AC
  Administered 2019-01-04 – 2019-01-06 (×3): 50 mg via ORAL
  Filled 2019-01-04 (×4): qty 1

## 2019-01-04 NOTE — BHH Group Notes (Signed)
Type of Therapy and Topic:  Group Therapy:  Trust and Honesty 01/04/2019  Participation Level:  Did Not Attend   Description of Group:     In this group patients will be asked to explore the value of being honest.  Patients will be guided to discuss their thoughts, feelings, and behaviors related to honesty and trusting in others. Patients will process together how trust and honesty relate to forming relationships with peers, family members, and self. Each patient will be challenged to identify and express feelings of being vulnerable. Patients will discuss reasons why people are dishonest and identify alternative outcomes if one was truthful (to self or others). This group will be process-oriented, with patients participating in exploration of their own experiences, giving and receiving support, and processing challenge from other group members.    Therapeutic Goals:  1.  Patient will identify why honesty is important to relationships and how honesty overall affects relationships.  2.  Patient will identify a situation where they lied or were lied too and the  feelings, thought process, and behaviors surrounding the situation  3.  Patient will identify the meaning of being vulnerable, how that feels, and how that correlates to being honest with self and others.  4.  Patient will identify situations where they could have told the truth, but instead lied and explain reasons of dishonesty.     Summary of Patient Progress:  Did not attend.  Therapeutic Modalities:   Cognitive Behavioral Therapy Solution Focused Therapy Motivational Interviewing Brief Therapy

## 2019-01-04 NOTE — Progress Notes (Signed)
[facility] MD Progress Note  01/04/2019 9:38 AM [name]  MRN:  [id number] Subjective:    Patient seen in bed he continues to endorse depressive symptoms and passive suicidal thoughts without plans or intent while here any understands what it is to contract for safety and can do so here.  He understands we are cross tapering antidepressants were reducing the Luvox and continuing citalopram which he has responded to previously.  We discussed other augmentation strategies but there is an allergy to buspirone.  Further he is requesting to have double portions or increase in food I am not sure how to order that but will look into it  Principal Problem: Major depressive disorder, recurrent severe without psychotic features ([place]) Diagnosis: Principal Problem:   Major depressive disorder, recurrent severe without psychotic features ([place]) Active Problems:   Hypothyroidism   HTN (hypertension)   Dyslipidemia   GERD (gastroesophageal reflux disease)   Type 1 diabetes mellitus ([place])   Toxic effect of petroleum products, intentional self-harm, sequela ([place])   Suicide attempt ([place])   Tracheostomy in place [facility])  Total Time spent with patient: 20 minutes  Past Psychiatric History: Prior response to citalopram reported  Past Medical History:  Past Medical History:  Diagnosis Date  . Depression   . Diabetes (HCC)    Insulin Pump  . Diabetes mellitus type I ([place])   . Diabetes mellitus without complication ([place])   . GERD (gastroesophageal reflux disease)   . H/O laryngectomy   . Heel bone fracture   . Hyperlipidemia   . Hypertension   . Radicular pain of right lower extremity   . Stroke ([place])   . Suicide attempt [facility]) 2014   damaged larynx - tracheostomy  . Thyroid disease     Past Surgical History:  Procedure Laterality Date  . COLONOSCOPY WITH PROPOFOL N/A 05/15/2018   Procedure: COLONOSCOPY WITH PROPOFOL;  Surgeon: [name], [name], MD;  Location: [facility] ENDOSCOPY;  Service:  Gastroenterology;  Laterality: N/A;  . ESOPHAGOGASTRODUODENOSCOPY N/A 05/15/2018   Procedure: ESOPHAGOGASTRODUODENOSCOPY (EGD);  Surgeon: [name], [name], MD;  Location: [facility] ENDOSCOPY;  Service: Gastroenterology;  Laterality: N/A;  . FRACTURE SURGERY    . Heel bone reconstruction Left   . HERNIA REPAIR  [id number]   Umbilical hernia repair   . LARYNGECTOMY    . NECK SURGERY     fusion  . SPINE SURGERY    . TRACHEOSTOMY  2014   from SI attempt   Family History:  Family History  Problem Relation Age of Onset  . Osteoporosis Mother   . Diabetes Mother   . Hypertension Father    Family Psychiatric  History: No new data shared Social History:  Social History   Substance and Sexual Activity  Alcohol Use Yes  . Alcohol/week: 2.0 standard drinks  . Types: 2 Shots of liquor per week   Comment: rare     Social History   Substance and Sexual Activity  Drug Use No   Comment: Pt denied; UDS not available    Social History   Socioeconomic History  . Marital status: Married    Spouse name: Not on file  . Number of children: Not on file  . Years of education: Not on file  . Highest education level: Not on file  Occupational History  . Not on file  Social Needs  . Financial resource strain: Not on file  . Food insecurity    Worry: Not on file    Inability: Not on file  .  Transportation needs    Medical: Not on file    Non-medical: Not on file  Tobacco Use  . Smoking status: Former Smoker    Packs/day: 0.00    Types: Cigarettes    Quit date: 04/09/2013    Years since quitting: 5.7  . Smokeless tobacco: Never Used  Substance and Sexual Activity  . Alcohol use: Yes    Alcohol/week: 2.0 standard drinks    Types: 2 Shots of liquor per week    Comment: rare  . Drug use: No    Comment: Pt denied; UDS not available  . Sexual activity: Yes    Partners: Female    Birth control/protection: Condom  Lifestyle  . Physical activity    Days per week: Not on file    Minutes per  session: Not on file  . Stress: Not on file  Relationships  . Social Herbalist on phone: Not on file    Gets together: Not on file    Attends religious service: Not on file    Active member of club or organization: Not on file    Attends meetings of clubs or organizations: Not on file    Relationship status: Not on file  Other Topics Concern  . Not on file  Social History Narrative  . Not on file   Additional Social History:                         Sleep: Good  Appetite:  Good  Current Medications: Current Facility-Administered Medications  Medication Dose Route Frequency Provider Last Rate Last Dose  . amLODipine (NORVASC) tablet 5 mg  5 mg Oral Daily Clapacs, [name], MD   5 mg at 01/04/19 0756  . aspirin EC tablet 81 mg  81 mg Oral Daily Clapacs, [name], MD   81 mg at 01/04/19 0756  . citalopram (CELEXA) tablet 20 mg  20 mg Oral Daily Clapacs, [name], MD   20 mg at 01/04/19 0756  . diphenhydrAMINE (BENADRYL) capsule 50 mg  50 mg Oral Q6H PRN [name], NP   50 mg at 01/02/19 1733  . fluvoxaMINE (LUVOX) tablet 50 mg  50 mg Oral QHS Johnn Hai, MD      . hydrochlorothiazide (HYDRODIURIL) tablet 25 mg  25 mg Oral Daily Clapacs, [name], MD   25 mg at 01/04/19 0756  . hydrocortisone cream 1 %   Topical BID Clapacs, [name], MD      . insulin aspart (novoLOG) injection 0-20 Units  0-20 Units Subcutaneous TID WC Clapacs, [name], MD   Stopped at 01/04/19 [phone]  . insulin aspart (novoLOG) injection 15 Units  15 Units Subcutaneous TID WC Clapacs, [name], MD   Stopped at 01/04/19 [phone]  . insulin glargine (LANTUS) injection 25 Units  25 Units Subcutaneous Daily Clapacs, [name], MD   25 Units at 01/04/19 0755  . levothyroxine (SYNTHROID) tablet 75 mcg  75 mcg Oral Q0600 Clapacs, [name], MD   75 mcg at 01/04/19 0606  . losartan (COZAAR) tablet 100 mg  100 mg Oral Daily Clapacs, [name], MD   100 mg at 01/04/19 0757  . pantoprazole (PROTONIX) EC tablet 40 mg  40  mg Oral Daily Clapacs, [name], MD   40 mg at 01/04/19 0756  . promethazine (PHENERGAN) tablet 25 mg  25 mg Oral Q6H PRN Clapacs, [name], MD   25 mg at 01/01/19 1329  .  QUEtiapine (SEROQUEL) tablet 200 mg  200 mg Oral QHS Clapacs, [name], MD   200 mg at 01/03/19 2109  . simvastatin (ZOCOR) tablet 20 mg  20 mg Oral q1800 Clapacs, [name], MD   20 mg at 01/03/19 1624  . traMADol (ULTRAM) tablet 100 mg  100 mg Oral Q6H PRN Clapacs, [name], MD   100 mg at 01/03/19 1623  . traZODone (DESYREL) tablet 100 mg  100 mg Oral QHS [name], NP   100 mg at 01/03/19 2109    Lab Results:  Results for orders placed or performed during the hospital encounter of 12/31/18 (from the past 48 hour(s))  Glucose, capillary     Status: Abnormal   Collection Time: 01/02/19 11:38 AM  Result Value Ref Range   Glucose-Capillary 236 (H) 70 - 99 mg/dL  Glucose, capillary     Status: Abnormal   Collection Time: 01/02/19  4:18 PM  Result Value Ref Range   Glucose-Capillary 159 (H) 70 - 99 mg/dL  Glucose, capillary     Status: Abnormal   Collection Time: 01/02/19  8:09 PM  Result Value Ref Range   Glucose-Capillary 270 (H) 70 - 99 mg/dL   Comment 1 Notify RN   Glucose, capillary     Status: Abnormal   Collection Time: 01/03/19  7:04 AM  Result Value Ref Range   Glucose-Capillary 176 (H) 70 - 99 mg/dL   Comment 1 Notify RN   Glucose, capillary     Status: Abnormal   Collection Time: 01/03/19 11:55 AM  Result Value Ref Range   Glucose-Capillary 331 (H) 70 - 99 mg/dL  Glucose, capillary     Status: None   Collection Time: 01/03/19  4:16 PM  Result Value Ref Range   Glucose-Capillary 76 70 - 99 mg/dL  Glucose, capillary     Status: None   Collection Time: 01/03/19  4:40 PM  Result Value Ref Range   Glucose-Capillary 98 70 - 99 mg/dL  Glucose, capillary     Status: Abnormal   Collection Time: 01/03/19  8:43 PM  Result Value Ref Range   Glucose-Capillary 240 (H) 70 - 99 mg/dL  Glucose, capillary      Status: Abnormal   Collection Time: 01/04/19  7:02 AM  Result Value Ref Range   Glucose-Capillary 177 (H) 70 - 99 mg/dL   Comment 1 Notify RN     Blood Alcohol level:  Lab Results  Component Value Date   ETH <10 12/31/2018   ETH <10 123XX123    Metabolic Disorder Labs: Lab Results  Component Value Date   HGBA1C 7.8 (H) 01/01/2019   MPG 177.16 01/01/2019   MPG 174.29 12/23/2018   No results found for: PROLACTIN Lab Results  Component Value Date   CHOL 161 09/15/2016   TRIG 190 (H) 09/15/2016   HDL 41 09/15/2016   CHOLHDL 3.9 09/15/2016   VLDL 38 09/15/2016   LDLCALC 82 09/15/2016    Physical Findings: AIMS:  , ,  ,  ,    CIWA:    COWS:     Musculoskeletal: Strength & Muscle Tone: within normal limits Gait & Station: normal Patient leans: N/A  Psychiatric Specialty Exam: Physical Exam  ROS  Blood pressure 137/81, pulse 78, temperature 98 F (36.7 C), temperature source Oral, resp. rate 18, height 6' (1.829 m), weight 108.9 kg, SpO2 96 %.Body mass index is 32.55 kg/m.  General Appearance: Casual  Eye Contact:  Good  Speech:  Speaks by covering  trach  Volume:  Normal  Mood:  Depressed  Affect:  Appropriate and Congruent  Thought Process:  Goal Directed and Descriptions of Associations: Intact  Orientation:  Full (Time, Place, and Person)  Thought Content:  Logical and Rumination  Suicidal Thoughts:  Yes.  without intent/plan  Homicidal Thoughts:  No  Memory:  Immediate;   Good Recent;   Good Remote;   Good  Judgement:  Good  Insight:  Good  Psychomotor Activity:  Normal  Concentration:  Concentration: Good and Attention Span: Good  Recall:  Good  Fund of Knowledge:  Good  Language:  Good  Akathisia:  Negative  Handed:  Right  AIMS (if indicated):     Assets:  Communication Skills Desire for Improvement  ADL's:  Intact  Cognition:  WNL  Sleep:  Number of Hours: 7     Treatment Plan Summary: Daily contact with patient to assess and evaluate  symptoms and progress in treatment and Medication management decrease and discontinue fluvoxamine continue citalopram discussed other augmentation strategies continue cognitive therapy and current precautions no change in precautions today  Lynford Espinoza, MD 01/04/2019, 9:38 AM

## 2019-01-04 NOTE — Plan of Care (Signed)
Patient is out of bed and visible in the milieu. Alert and oriented. Pleasant and cooperative. Denying thoughts of self harm. Denying hallucinations. Had breakfast in the dayroom and appetite is good. Received AM medications. Insulin Novolog held per MD order. Lantus 25units given. CBG 177 this morning. Patient has no sign of distress. Encouragements provided. Safety monitored per unit protocol.

## 2019-01-05 LAB — GLUCOSE, CAPILLARY
Glucose-Capillary: 269 mg/dL — ABNORMAL HIGH (ref 70–99)
Glucose-Capillary: 178 mg/dL — ABNORMAL HIGH (ref 70–99)
Glucose-Capillary: 158 mg/dL — ABNORMAL HIGH (ref 70–99)
Glucose-Capillary: 244 mg/dL — ABNORMAL HIGH (ref 70–99)

## 2019-01-05 NOTE — Progress Notes (Signed)
D: Patient has been pleasant and cooperative. Less attention seeking. Continues to report feeling depressed but denies SI. Contracting for safety. A: Continue to monitor for safety R: Safety maintained.

## 2019-01-05 NOTE — Plan of Care (Signed)
  Problem: Education: Goal: Knowledge of [facility] Education information/materials will improve Outcome: Progressing Goal: Emotional status will improve Outcome: Progressing Goal: Mental status will improve Outcome: Progressing Goal: Verbalization of understanding the information provided will improve Outcome: Progressing  D: Patient has been pleasant and cooperative. Less attention seeking. Continues to report feeling depressed but denies SI. Contracting for safety. A: Continue to monitor for safety R: Safety maintained.

## 2019-01-05 NOTE — Plan of Care (Addendum)
Active in the milieu. Alert and oriented. Continues to express depression but contacts for safety. Has support system (wife visiting). Reporting increased appetite : Requesting a double portion. Denying thoughts of self harm. Denying hallucination. No major concern today.

## 2019-01-05 NOTE — B[facility] Group Notes (Signed)
LCSW Aftercare Discharge Planning Group Note   01/05/2019 1315  Type of Group and Topic: Psychoeducational Group:  Discharge Planning  Participation Level:  Did Not Attend  Description of Group  Discharge planning group reviews patient's anticipated discharge plans and assists patients to anticipate and address any barriers to wellness/recovery in the community.  Suicide prevention education is reviewed with patients in group.  Therapeutic Goals 1. Patients will state their anticipated discharge plan and mental health aftercare 2. Patients will identify potential barriers to wellness in the community setting 3. Patients will engage in problem solving, solution focused discussion of ways to anticipate and address barriers to wellness/recovery  Summary of Patient Progress   Plan for Discharge/Comments:    Transportation Means:   Supports:  Therapeutic Modalities: Motivational Interviewing    Joanne Chars, [place] 01/05/2019 2:14 PM

## 2019-01-05 NOTE — Progress Notes (Signed)
[facility] MD Progress Note  01/05/2019 10:45 AM [name]  MRN:  [id number] Subjective:   Patient stays in bed continues to report depressive symptoms no suicidal thoughts plans or intent today, understands what it is to contract.  Understands the cross tapering and discontinuation of fluvoxamine, and use of citalopram.  Continues to ask for double portions and increased meals however his diabetes would preclude this but he states that he would be happy with just an extra salad.  We will see if I can accommodate this in the order set.  Continue cognitive therapy  Principal Problem: Major depressive disorder, recurrent severe without psychotic features ([place]) Diagnosis: Principal Problem:   Major depressive disorder, recurrent severe without psychotic features ([place]) Active Problems:   Hypothyroidism   HTN (hypertension)   Dyslipidemia   GERD (gastroesophageal reflux disease)   Type 1 diabetes mellitus ([place])   Toxic effect of petroleum products, intentional self-harm, sequela ([place])   Suicide attempt ([place])   Tracheostomy in place [facility])  Total Time spent with patient: 20 minutes  Past Psychiatric History: Reported partial response to fluvoxamine  Past Medical History:  Past Medical History:  Diagnosis Date  . Depression   . Diabetes (HCC)    Insulin Pump  . Diabetes mellitus type I ([place])   . Diabetes mellitus without complication ([place])   . GERD (gastroesophageal reflux disease)   . H/O laryngectomy   . Heel bone fracture   . Hyperlipidemia   . Hypertension   . Radicular pain of right lower extremity   . Stroke ([place])   . Suicide attempt [facility]) 2014   damaged larynx - tracheostomy  . Thyroid disease     Past Surgical History:  Procedure Laterality Date  . COLONOSCOPY WITH PROPOFOL N/A 05/15/2018   Procedure: COLONOSCOPY WITH PROPOFOL;  Surgeon: [name], [name], MD;  Location: [facility] ENDOSCOPY;  Service: Gastroenterology;  Laterality: N/A;  . ESOPHAGOGASTRODUODENOSCOPY N/A  05/15/2018   Procedure: ESOPHAGOGASTRODUODENOSCOPY (EGD);  Surgeon: [name], [name], MD;  Location: [facility] ENDOSCOPY;  Service: Gastroenterology;  Laterality: N/A;  . FRACTURE SURGERY    . Heel bone reconstruction Left   . HERNIA REPAIR  [id number]   Umbilical hernia repair   . LARYNGECTOMY    . NECK SURGERY     fusion  . SPINE SURGERY    . TRACHEOSTOMY  2014   from SI attempt   Family History:  Family History  Problem Relation Age of Onset  . Osteoporosis Mother   . Diabetes Mother   . Hypertension Father    Family Psychiatric  History: No new data shared Social History:  Social History   Substance and Sexual Activity  Alcohol Use Yes  . Alcohol/week: 2.0 standard drinks  . Types: 2 Shots of liquor per week   Comment: rare     Social History   Substance and Sexual Activity  Drug Use No   Comment: Pt denied; UDS not available    Social History   Socioeconomic History  . Marital status: Married    Spouse name: Not on file  . Number of children: Not on file  . Years of education: Not on file  . Highest education level: Not on file  Occupational History  . Not on file  Social Needs  . Financial resource strain: Not on file  . Food insecurity    Worry: Not on file    Inability: Not on file  . Transportation needs    Medical: Not on file  Non-medical: Not on file  Tobacco Use  . Smoking status: Former Smoker    Packs/day: 0.00    Types: Cigarettes    Quit date: 04/09/2013    Years since quitting: 5.7  . Smokeless tobacco: Never Used  Substance and Sexual Activity  . Alcohol use: Yes    Alcohol/week: 2.0 standard drinks    Types: 2 Shots of liquor per week    Comment: rare  . Drug use: No    Comment: Pt denied; UDS not available  . Sexual activity: Yes    Partners: Female    Birth control/protection: Condom  Lifestyle  . Physical activity    Days per week: Not on file    Minutes per session: Not on file  . Stress: Not on file  Relationships  . Social  Herbalist on phone: Not on file    Gets together: Not on file    Attends religious service: Not on file    Active member of club or organization: Not on file    Attends meetings of clubs or organizations: Not on file    Relationship status: Not on file  Other Topics Concern  . Not on file  Social History Narrative  . Not on file   Additional Social History:                         Sleep: Good  Appetite:  Good  Current Medications: Current Facility-Administered Medications  Medication Dose Route Frequency Provider Last Rate Last Dose  . amLODipine (NORVASC) tablet 5 mg  5 mg Oral Daily Clapacs, [name], MD   5 mg at 01/05/19 0806  . aspirin EC tablet 81 mg  81 mg Oral Daily Clapacs, [name], MD   81 mg at 01/05/19 0806  . citalopram (CELEXA) tablet 20 mg  20 mg Oral Daily Clapacs, [name], MD   20 mg at 01/05/19 0807  . diphenhydrAMINE (BENADRYL) capsule 50 mg  50 mg Oral Q6H PRN [name], NP   50 mg at 01/04/19 1949  . fluvoxaMINE (LUVOX) tablet 50 mg  50 mg Oral QHS Johnn Hai, MD   50 mg at 01/04/19 2107  . hydrochlorothiazide (HYDRODIURIL) tablet 25 mg  25 mg Oral Daily Clapacs, [name], MD   25 mg at 01/05/19 0806  . hydrocortisone cream 1 %   Topical BID Clapacs, [name], MD      . insulin aspart (novoLOG) injection 0-20 Units  0-20 Units Subcutaneous TID WC Clapacs, [name], MD   11 Units at 01/05/19 0804  . insulin aspart (novoLOG) injection 5 Units  5 Units Subcutaneous TID WC Johnn Hai, MD   5 Units at 01/05/19 0805  . insulin glargine (LANTUS) injection 25 Units  25 Units Subcutaneous Daily Clapacs, [name], MD   25 Units at 01/05/19 0803  . levothyroxine (SYNTHROID) tablet 75 mcg  75 mcg Oral Q0600 Clapacs, [name], MD   75 mcg at 01/05/19 0617  . losartan (COZAAR) tablet 100 mg  100 mg Oral Daily Clapacs, [name], MD   100 mg at 01/05/19 0808  . pantoprazole (PROTONIX) EC tablet 40 mg  40 mg Oral Daily Clapacs, [name], MD   40 mg at 01/05/19  0806  . promethazine (PHENERGAN) tablet 25 mg  25 mg Oral Q6H PRN Clapacs, [name], MD   25 mg at 01/01/19 1329  . QUEtiapine (SEROQUEL) tablet 200 mg  200  mg Oral QHS Clapacs, [name], MD   200 mg at 01/04/19 2107  . simvastatin (ZOCOR) tablet 20 mg  20 mg Oral q1800 Clapacs, [name], MD   20 mg at 01/04/19 1635  . traMADol (ULTRAM) tablet 100 mg  100 mg Oral Q6H PRN Clapacs, [name], MD   100 mg at 01/04/19 1948  . traZODone (DESYREL) tablet 100 mg  100 mg Oral QHS [name], NP   100 mg at 01/04/19 2107    Lab Results:  Results for orders placed or performed during the hospital encounter of 12/31/18 (from the past 48 hour(s))  Glucose, capillary     Status: Abnormal   Collection Time: 01/03/19 11:55 AM  Result Value Ref Range   Glucose-Capillary 331 (H) 70 - 99 mg/dL  Glucose, capillary     Status: None   Collection Time: 01/03/19  4:16 PM  Result Value Ref Range   Glucose-Capillary 76 70 - 99 mg/dL  Glucose, capillary     Status: None   Collection Time: 01/03/19  4:40 PM  Result Value Ref Range   Glucose-Capillary 98 70 - 99 mg/dL  Glucose, capillary     Status: Abnormal   Collection Time: 01/03/19  8:43 PM  Result Value Ref Range   Glucose-Capillary 240 (H) 70 - 99 mg/dL  Glucose, capillary     Status: Abnormal   Collection Time: 01/04/19  7:02 AM  Result Value Ref Range   Glucose-Capillary 177 (H) 70 - 99 mg/dL   Comment 1 Notify RN   Glucose, capillary     Status: Abnormal   Collection Time: 01/04/19 11:32 AM  Result Value Ref Range   Glucose-Capillary 272 (H) 70 - 99 mg/dL  Glucose, capillary     Status: Abnormal   Collection Time: 01/04/19  4:14 PM  Result Value Ref Range   Glucose-Capillary 228 (H) 70 - 99 mg/dL  Glucose, capillary     Status: Abnormal   Collection Time: 01/04/19  8:44 PM  Result Value Ref Range   Glucose-Capillary 274 (H) 70 - 99 mg/dL  Glucose, capillary     Status: Abnormal   Collection Time: 01/05/19  6:49 AM  Result Value Ref Range    Glucose-Capillary 269 (H) 70 - 99 mg/dL   Comment 1 Notify RN     Blood Alcohol level:  Lab Results  Component Value Date   ETH <10 12/31/2018   ETH <10 123XX123    Metabolic Disorder Labs: Lab Results  Component Value Date   HGBA1C 7.8 (H) 01/01/2019   MPG 177.16 01/01/2019   MPG 174.29 12/23/2018   No results found for: PROLACTIN Lab Results  Component Value Date   CHOL 161 09/15/2016   TRIG 190 (H) 09/15/2016   HDL 41 09/15/2016   CHOLHDL 3.9 09/15/2016   VLDL 38 09/15/2016   LDLCALC 82 09/15/2016    Physical Findings: AIMS:  , ,  ,  ,    CIWA:    COWS:     Musculoskeletal: Strength & Muscle Tone: within normal limits Gait & Station: normal Patient leans: N/A  Psychiatric Specialty Exam: Physical Exam  ROS  Blood pressure (!) 141/84, pulse 75, temperature 98.3 F (36.8 C), temperature source Oral, resp. rate 18, height 6' (1.829 m), weight 108.9 kg, SpO2 96 %.Body mass index is 32.55 kg/m.  General Appearance: Casual  Eye Contact:  Good  Speech:  Clear and Coherent/despite trach  Volume:  Normal  Mood:  Anxious and Dysphoric  Affect:  Appropriate and Congruent  Thought Process:  Coherent, Linear and Descriptions of Associations: Intact  Orientation:  Full (Time, Place, and Person)  Thought Content:  Logical  Suicidal Thoughts:  No  Homicidal Thoughts:  No  Memory:  Immediate;   Fair Recent;   Fair  Judgement:  Fair  Insight:  Good  Psychomotor Activity:  Normal  Concentration:  Concentration: Fair  Recall:  [place] of Knowledge:  Good  Language:  Good  Akathisia:  Negative  Handed:  Right  AIMS (if indicated):     Assets:  Desire for Improvement Financial Resources/Insurance  ADL's:  Intact  Cognition:  WNL  Sleep:  Number of Hours: 7.25     Treatment Plan Summary: Daily contact with patient to assess and evaluate symptoms and progress in treatment and Medication management  Continue current cognitive therapies continue cross  tapering of anti-depressants continue to monitor for safety no change in therapy focus or precautions today  Joel Christiano, MD 01/05/2019, 10:45 AM

## 2019-01-06 ENCOUNTER — Ambulatory Visit: Payer: Self-pay | Admitting: Pharmacist

## 2019-01-06 LAB — GLUCOSE, CAPILLARY
Glucose-Capillary: 288 mg/dL — ABNORMAL HIGH (ref 70–99)
Glucose-Capillary: 110 mg/dL — ABNORMAL HIGH (ref 70–99)
Glucose-Capillary: 186 mg/dL — ABNORMAL HIGH (ref 70–99)

## 2019-01-06 MED ORDER — FLUVOXAMINE MALEATE 50 MG PO TABS: 50.0000 mg | ORAL_TABLET | Freq: Every day | ORAL | 0 refills | Status: DC

## 2019-01-06 MED ORDER — HYDROCHLOROTHIAZIDE 25 MG PO TABS: 25.0000 mg | ORAL_TABLET | Freq: Every day | ORAL | 0 refills | Status: DC

## 2019-01-06 MED ORDER — QUETIAPINE FUMARATE 200 MG PO TABS: 200.0000 mg | ORAL_TABLET | Freq: Every day | ORAL | 0 refills | Status: DC

## 2019-01-06 MED ORDER — SIMVASTATIN 20 MG PO TABS: 20.0000 mg | ORAL_TABLET | Freq: Every day | ORAL | 0 refills | Status: DC

## 2019-01-06 MED ORDER — CITALOPRAM HYDROBROMIDE 20 MG PO TABS: 20.0000 mg | ORAL_TABLET | Freq: Every day | ORAL | 0 refills | Status: DC

## 2019-01-06 MED ORDER — LEVOTHYROXINE SODIUM 75 MCG PO TABS: 75.0000 ug | ORAL_TABLET | Freq: Every day | ORAL | 0 refills | Status: DC

## 2019-01-06 MED ORDER — PANTOPRAZOLE SODIUM 40 MG PO TBEC: 40.0000 mg | DELAYED_RELEASE_TABLET | Freq: Every day | ORAL | 0 refills | Status: DC

## 2019-01-06 MED ORDER — LOSARTAN POTASSIUM 100 MG PO TABS: 100.0000 mg | ORAL_TABLET | Freq: Every day | ORAL | 0 refills | Status: DC

## 2019-01-06 MED ORDER — TRAZODONE HCL 100 MG PO TABS: 100.0000 mg | ORAL_TABLET | Freq: Every day | ORAL | 0 refills | Status: DC

## 2019-01-06 NOTE — BHH Suicide Risk Assessment (Signed)
[facility] Discharge Suicide Risk Assessment   Principal Problem: Major depressive disorder, recurrent severe without psychotic features ([place]) Discharge Diagnoses: Principal Problem:   Major depressive disorder, recurrent severe without psychotic features ([place]) Active Problems:   Hypothyroidism   HTN (hypertension)   Dyslipidemia   GERD (gastroesophageal reflux disease)   Type 1 diabetes mellitus ([place])   Toxic effect of petroleum products, intentional self-harm, sequela ([place])   Suicide attempt ([place])   Tracheostomy in place [facility])   Total Time spent with patient: 30 minutes  Musculoskeletal: Strength & Muscle Tone: within normal limits Gait & Station: normal Patient leans: N/A  Psychiatric Specialty Exam: Review of Systems  Constitutional: Negative.   HENT: Negative.   Eyes: Negative.   Respiratory: Negative.   Cardiovascular: Negative.   Gastrointestinal: Negative.   Musculoskeletal: Negative.   Skin: Negative.   Neurological: Negative.   Psychiatric/Behavioral: Negative for depression, hallucinations, memory loss, substance abuse and suicidal ideas. The patient is nervous/anxious. The patient does not have insomnia.     Blood pressure 136/76, pulse 77, temperature 97.7 F (36.5 C), temperature source Oral, resp. rate 16, height 6' (1.829 m), weight 108.9 kg, SpO2 98 %.Body mass index is 32.55 kg/m.  General Appearance: Casual  Eye Contact::  Good  Speech:  Clear and [id number]  Volume:  Normal  Mood:  Euthymic  Affect:  Congruent  Thought Process:  Goal Directed  Orientation:  Full (Time, Place, and Person)  Thought Content:  Logical  Suicidal Thoughts:  No  Homicidal Thoughts:  No  Memory:  Immediate;   Fair Recent;   Fair Remote;   Fair  Judgement:  Fair  Insight:  Fair  Psychomotor Activity:  Normal  Concentration:  Fair  Recall:  [employer] [place]  Language: Fair  Akathisia:  No  Handed:  Right  AIMS (if indicated):     Assets:  Desire for  Improvement Housing Resilience Social Support  Sleep:  Number of Hours: 6  Cognition: WNL  ADL's:  Intact   Mental Status Per Nursing Assessment::   On Admission:  NA  Demographic Factors:  Male, Caucasian and Unemployed  Loss Factors: Decline in physical health  Historical Factors: Prior suicide attempts and Impulsivity  Risk Reduction Factors:   Living with another person, especially a relative, Positive social support and Positive therapeutic relationship  Continued Clinical Symptoms:  Depression:   Hopelessness  Cognitive Features That Contribute To Risk:  None    Suicide Risk:  Minimal: No identifiable suicidal ideation.  Patients presenting with no risk factors but with morbid ruminations; may be classified as minimal risk based on the severity of the depressive symptoms  Follow-up Information    [facility] Follow up.   Specialty: Behavioral Health Why: A referral for you has been sent on 8/26. Please call them directly at discharge to let them know you are discharged from the hospital and need to be scheduled. Thank You! Contact information: [address] [place] [phone]          Plan Of Care/Follow-up recommendations:  Activity:  Activity as tolerated Diet:  Regular diet Other:  Follow-up with outpatient care as directed  Alethia Berthold, MD 01/06/2019, 3:30 PM

## 2019-01-06 NOTE — Progress Notes (Signed)
[facility] MD Progress Note  01/06/2019 3:17 PM [name]  MRN:  [id number] Subjective: Follow-up for this gentleman with major depression.  Patient seen and chart reviewed.  He tells me he is feeling much better today.  Not feeling depressed.  Not having any suicidal thoughts at all.  He identifies himself as still feeling "anxious" and says that his thoughts race around a lot but he is not showing it on the outside.  Certainly does not seem manic.  He is neatly dressed and groomed and very calmly interacting with peers and staff.  We had a solid conversation about his medication.  He feels comfortable with the lower doses of 2 antidepressants at this point.  As far as his blood sugars he tells me that he is feeling very hungry and wishes that he could have more food.  He would like to change to the regular diet and tells me that this is going to be much more like what he eats as an outpatient.  Patient is managed with a insulin pump as an outpatient which certainly is going to be entirely different so if it makes for better quality of life I will change the diet. Principal Problem: Major depressive disorder, recurrent severe without psychotic features ([place]) Diagnosis: Principal Problem:   Major depressive disorder, recurrent severe without psychotic features ([place]) Active Problems:   Hypothyroidism   HTN (hypertension)   Dyslipidemia   GERD (gastroesophageal reflux disease)   Type 1 diabetes mellitus (HCC)   Toxic effect of petroleum products, intentional self-harm, sequela ([place])   Suicide attempt ([place])   Tracheostomy in place [facility])  Total Time spent with patient: 30 minutes  Past Psychiatric History: Past history of recurrent episodes of depression and suicide attempts  Past Medical History:  Past Medical History:  Diagnosis Date  . Depression   . Diabetes (HCC)    Insulin Pump  . Diabetes mellitus type I ([place])   . Diabetes mellitus without complication ([place])   . GERD  (gastroesophageal reflux disease)   . H/O laryngectomy   . Heel bone fracture   . Hyperlipidemia   . Hypertension   . Radicular pain of right lower extremity   . Stroke ([place])   . Suicide attempt [facility]) 2014   damaged larynx - tracheostomy  . Thyroid disease     Past Surgical History:  Procedure Laterality Date  . COLONOSCOPY WITH PROPOFOL N/A 05/15/2018   Procedure: COLONOSCOPY WITH PROPOFOL;  Surgeon: [name], [name], MD;  Location: [facility] ENDOSCOPY;  Service: Gastroenterology;  Laterality: N/A;  . ESOPHAGOGASTRODUODENOSCOPY N/A 05/15/2018   Procedure: ESOPHAGOGASTRODUODENOSCOPY (EGD);  Surgeon: [name], [name], MD;  Location: [facility] ENDOSCOPY;  Service: Gastroenterology;  Laterality: N/A;  . FRACTURE SURGERY    . Heel bone reconstruction Left   . HERNIA REPAIR  [id number]   Umbilical hernia repair   . LARYNGECTOMY    . NECK SURGERY     fusion  . SPINE SURGERY    . TRACHEOSTOMY  2014   from SI attempt   Family History:  Family History  Problem Relation Age of Onset  . Osteoporosis Mother   . Diabetes Mother   . Hypertension Father    Family Psychiatric  History: See previous Social History:  Social History   Substance and Sexual Activity  Alcohol Use Yes  . Alcohol/week: 2.0 standard drinks  . Types: 2 Shots of liquor per week   Comment: rare     Social History   Substance and Sexual Activity  Drug Use No   Comment: Pt denied; UDS not available    Social History   Socioeconomic History  . Marital status: Married    Spouse name: Not on file  . Number of children: Not on file  . Years of education: Not on file  . Highest education level: Not on file  Occupational History  . Not on file  Social Needs  . Financial resource strain: Not on file  . Food insecurity    Worry: Not on file    Inability: Not on file  . Transportation needs    Medical: Not on file    Non-medical: Not on file  Tobacco Use  . Smoking status: Former Smoker    Packs/day: 0.00    Types:  Cigarettes    Quit date: 04/09/2013    Years since quitting: 5.7  . Smokeless tobacco: Never Used  Substance and Sexual Activity  . Alcohol use: Yes    Alcohol/week: 2.0 standard drinks    Types: 2 Shots of liquor per week    Comment: rare  . Drug use: No    Comment: Pt denied; UDS not available  . Sexual activity: Yes    Partners: Female    Birth control/protection: Condom  Lifestyle  . Physical activity    Days per week: Not on file    Minutes per session: Not on file  . Stress: Not on file  Relationships  . Social Herbalist on phone: Not on file    Gets together: Not on file    Attends religious service: Not on file    Active member of club or organization: Not on file    Attends meetings of clubs or organizations: Not on file    Relationship status: Not on file  Other Topics Concern  . Not on file  Social History Narrative  . Not on file   Additional Social History:                         Sleep: Fair  Appetite:  Fair  Current Medications: Current Facility-Administered Medications  Medication Dose Route Frequency Provider Last Rate Last Dose  . amLODipine (NORVASC) tablet 5 mg  5 mg Oral Daily Clapacs, [name], MD   5 mg at 01/06/19 0750  . aspirin EC tablet 81 mg  81 mg Oral Daily Clapacs, [name], MD   81 mg at 01/06/19 0750  . citalopram (CELEXA) tablet 20 mg  20 mg Oral Daily Clapacs, [name], MD   20 mg at 01/06/19 0750  . diphenhydrAMINE (BENADRYL) capsule 50 mg  50 mg Oral Q6H PRN [name], NP   50 mg at 01/05/19 2313  . fluvoxaMINE (LUVOX) tablet 50 mg  50 mg Oral QHS Johnn Hai, MD   50 mg at 01/05/19 2112  . hydrochlorothiazide (HYDRODIURIL) tablet 25 mg  25 mg Oral Daily Clapacs, [name], MD   25 mg at 01/06/19 0749  . hydrocortisone cream 1 %   Topical BID Clapacs, [name], MD      . insulin aspart (novoLOG) injection 0-20 Units  0-20 Units Subcutaneous TID WC Clapacs, [name], MD   11 Units at 01/06/19 [phone]  . insulin  aspart (novoLOG) injection 5 Units  5 Units Subcutaneous TID WC Johnn Hai, MD   5 Units at 01/06/19 1159  . insulin glargine (LANTUS) injection 25 Units  25 Units Subcutaneous Daily Clapacs, [name], MD  25 Units at 01/06/19 0750  . levothyroxine (SYNTHROID) tablet 75 mcg  75 mcg Oral Q0600 Clapacs, [name], MD   75 mcg at 01/06/19 0612  . losartan (COZAAR) tablet 100 mg  100 mg Oral Daily Clapacs, [name], MD   100 mg at 01/06/19 0750  . pantoprazole (PROTONIX) EC tablet 40 mg  40 mg Oral Daily Clapacs, [name], MD   40 mg at 01/06/19 0750  . promethazine (PHENERGAN) tablet 25 mg  25 mg Oral Q6H PRN Clapacs, [name], MD   25 mg at 01/01/19 1329  . QUEtiapine (SEROQUEL) tablet 200 mg  200 mg Oral QHS Clapacs, [name], MD   200 mg at 01/05/19 2112  . simvastatin (ZOCOR) tablet 20 mg  20 mg Oral q1800 Clapacs, [name], MD   20 mg at 01/05/19 1640  . traMADol (ULTRAM) tablet 100 mg  100 mg Oral Q6H PRN Clapacs, [name], MD   100 mg at 01/06/19 1516  . traZODone (DESYREL) tablet 100 mg  100 mg Oral QHS [name], NP   100 mg at 01/05/19 2112    Lab Results:  Results for orders placed or performed during the hospital encounter of 12/31/18 (from the past 48 hour(s))  Glucose, capillary     Status: Abnormal   Collection Time: 01/04/19  4:14 PM  Result Value Ref Range   Glucose-Capillary 228 (H) 70 - 99 mg/dL  Glucose, capillary     Status: Abnormal   Collection Time: 01/04/19  8:44 PM  Result Value Ref Range   Glucose-Capillary 274 (H) 70 - 99 mg/dL  Glucose, capillary     Status: Abnormal   Collection Time: 01/05/19  6:49 AM  Result Value Ref Range   Glucose-Capillary 269 (H) 70 - 99 mg/dL   Comment 1 Notify RN   Glucose, capillary     Status: Abnormal   Collection Time: 01/05/19 12:04 PM  Result Value Ref Range   Glucose-Capillary 178 (H) 70 - 99 mg/dL  Glucose, capillary     Status: Abnormal   Collection Time: 01/05/19  4:33 PM  Result Value Ref Range   Glucose-Capillary 158 (H)  70 - 99 mg/dL  Glucose, capillary     Status: Abnormal   Collection Time: 01/05/19  8:51 PM  Result Value Ref Range   Glucose-Capillary 244 (H) 70 - 99 mg/dL  Glucose, capillary     Status: Abnormal   Collection Time: 01/06/19  7:01 AM  Result Value Ref Range   Glucose-Capillary 288 (H) 70 - 99 mg/dL   Comment 1 Notify RN   Glucose, capillary     Status: Abnormal   Collection Time: 01/06/19 11:23 AM  Result Value Ref Range   Glucose-Capillary 110 (H) 70 - 99 mg/dL    Blood Alcohol level:  Lab Results  Component Value Date   ETH <10 12/31/2018   ETH <10 123XX123    Metabolic Disorder Labs: Lab Results  Component Value Date   HGBA1C 7.8 (H) 01/01/2019   MPG 177.16 01/01/2019   MPG 174.29 12/23/2018   No results found for: PROLACTIN Lab Results  Component Value Date   CHOL 161 09/15/2016   TRIG 190 (H) 09/15/2016   HDL 41 09/15/2016   CHOLHDL 3.9 09/15/2016   VLDL 38 09/15/2016   LDLCALC 82 09/15/2016    Physical Findings: AIMS:  , ,  ,  ,    CIWA:    COWS:     Musculoskeletal: Strength & Muscle Tone:  within normal limits Gait & Station: normal Patient leans: N/A  Psychiatric Specialty Exam: Physical Exam  Nursing note and vitals reviewed. Constitutional: He appears well-developed and well-nourished.  HENT:  Head: Normocephalic and atraumatic.  Eyes: Pupils are equal, round, and reactive to light. Conjunctivae are normal.  Neck: Normal range of motion.  Cardiovascular: Regular rhythm and normal heart sounds.  Respiratory: Effort normal. No respiratory distress.  GI: Soft.  Musculoskeletal: Normal range of motion.  Neurological: He is alert.  Skin: Skin is warm and dry.  Psychiatric: His speech is normal and behavior is normal. Judgment and thought content normal. His mood appears anxious. Cognition and memory are normal.    Review of Systems  Constitutional: Negative.   HENT: Negative.   Eyes: Negative.   Respiratory: Negative.   Cardiovascular:  Negative.   Gastrointestinal: Negative.   Musculoskeletal: Negative.   Skin: Negative.   Neurological: Negative.   Psychiatric/Behavioral: Negative for depression, hallucinations, memory loss, substance abuse and suicidal ideas. The patient is nervous/anxious. The patient does not have insomnia.     Blood pressure 136/76, pulse 77, temperature 97.7 F (36.5 C), temperature source Oral, resp. rate 16, height 6' (1.829 m), weight 108.9 kg, SpO2 98 %.Body mass index is 32.55 kg/m.  General Appearance: Casual  Eye Contact:  Fair  Speech:  Normal Rate  Volume:  Normal  Mood:  Euthymic  Affect:  Constricted  Thought Process:  Goal Directed  Orientation:  Full (Time, Place, and Person)  Thought Content:  Logical  Suicidal Thoughts:  No  Homicidal Thoughts:  No  Memory:  Immediate;   Fair Recent;   Fair Remote;   Fair  Judgement:  Fair  Insight:  Fair  Psychomotor Activity:  Normal  Concentration:  Concentration: Fair  Recall:  [employer] of Knowledge:  Fair  Language:  Fair  Akathisia:  No  Handed:  Right  AIMS (if indicated):     Assets:  Communication Skills Desire for Improvement Housing Resilience Social Support  ADL's:  Intact  Cognition:  WNL  Sleep:  Number of Hours: 6     Treatment Plan Summary: Daily contact with patient to assess and evaluate symptoms and progress in treatment, Medication management and Plan Patient improved and stabilized.  Denies any suicidal thoughts.  Not showing any outward dangerous behavior.  He is anxious to make sure he has a psychiatry referral at discharge.  Medicines reviewed.  Blood sugars reviewed.  Diet changed to regular at his request.  He understands this is not as good for his diabetes but says it is more reflective of what he normally eats.  I will make preparations for likely discharge tomorrow.  Alethia Berthold, MD 01/06/2019, 3:17 PM

## 2019-01-06 NOTE — Progress Notes (Signed)
Recreation Therapy Notes   Date: 01/06/2019  Time: 9:30 am  Location: Craft room  Behavioral response: Appropriate   Intervention Topic: Relaxation   Discussion/Intervention:  Group content today was focused on relaxation. The group defined relaxation and identified healthy ways to relax. Individuals expressed how much time they spend relaxing. Patients expressed how much their life would be if they did not make time for themselves to relax. The group stated ways they could improve their relaxation techniques in the future.  Individuals participated in the intervention "Time to Relax" where they had a chance to experience different relaxation techniques.  Clinical Observations/Feedback:  Patient came to group and defined relaxation as relieving stress. He stated that yoga helps him relax. Individual was social with peers and staff while participating in the intervention.    Rojelio Uhrich LRT/CTRS         Armond Cuthrell 01/06/2019 10:51 AM

## 2019-01-06 NOTE — Progress Notes (Signed)
Inpatient Diabetes Program Recommendations  AACE/ADA: New Consensus Statement on Inpatient Glycemic Control (2015)  Target Ranges:  Prepandial:   less than 140 mg/dL      Peak postprandial:   less than 180 mg/dL (1-2 hours)      Critically ill patients:  140 - 180 mg/dL   Lab Results  Component Value Date   GLUCAP 110 (H) 01/06/2019   HGBA1C 7.8 (H) 01/01/2019    Review of Glycemic Control Results for SPIRO, [name] (MRN [id number]) as of 01/06/2019 12:25  Ref. Range 01/04/2019 20:44 01/05/2019 06:49 01/05/2019 12:04 01/05/2019 16:33 01/05/2019 20:51 01/06/2019 07:01 01/06/2019 11:23  Glucose-Capillary Latest Ref Range: 70 - 99 mg/dL 274 (H) 269 (H) 178 (H) 158 (H) 244 (H) 288 (H) 110 (H)  History: Type 1 Diabetes (makes NO insulin; requires basal, correction, and meal coverage insulin)  Home DM Meds: Insulin Pump                             Jardiance 25 mg Daily  Current Orders: Lantus 25 units Daily                            Novolog Resistant Correction Scale/ SSI (0-20 units) TID AC                            Novolog 5 units TID with meals Inpatient Diabetes Program Recommendations:    Please consider reducing Novolog correction to moderate tid with meals and ADD HS scale.  Also consider increasing Lantus to 28 units daily.  Thanks,  Adah Perl, RN, BC-ADM Inpatient Diabetes Coordinator Pager [phone] (8a-5p)

## 2019-01-06 NOTE — Plan of Care (Signed)
Patient is more visible in the milieu.Affect is brighter.Patient stated that his depression is better.Denies SI,HI and AVH.Attended groups.Compliant with medications.Appetite and energy level good.Support and encouragement given.

## 2019-01-06 NOTE — BHH Group Notes (Signed)

## 2019-01-06 NOTE — Progress Notes (Signed)
D: Patient has been restless, anxious and depressed. Became irritable when told it was not time for his Ultram and Benadryl. Patient given his Ultram and Benadryl q6h per prn order. Denies SI, HI and AVH A: Continue to monitor for safety R: Safety maintained.

## 2019-01-06 NOTE — Plan of Care (Signed)
  Problem: Education: Goal: Knowledge of [place] [facility] Education information/materials will improve Outcome: Progressing Goal: Emotional status will improve Outcome: Progressing Goal: Mental status will improve Outcome: Progressing Goal: Verbalization of understanding the information provided will improve Outcome: Progressing  D: Patient has been restless, anxious and depressed. Became irritable when told it was not time for his Ultram and Benadryl. Patient given his Ultram and Benadryl q6h per prn order. Denies SI, HI and AVH A: Continue to monitor for safety R: Safety maintained.

## 2019-01-07 ENCOUNTER — Telehealth (HOSPITAL_COMMUNITY): Payer: Self-pay | Admitting: Professional

## 2019-01-07 ENCOUNTER — Other Ambulatory Visit: Payer: Self-pay | Admitting: Pharmacist

## 2019-01-07 LAB — GLUCOSE, CAPILLARY
Glucose-Capillary: 288 mg/dL — ABNORMAL HIGH (ref 70–99)
Glucose-Capillary: 211 mg/dL — ABNORMAL HIGH (ref 70–99)

## 2019-01-07 MED ORDER — TRAMADOL HCL 50 MG PO TABS: 100.0000 mg | ORAL_TABLET | Freq: Four times a day (QID) | ORAL | 1 refills | Status: DC | PRN

## 2019-01-07 MED ORDER — EMPAGLIFLOZIN 25 MG PO TABS: 25.0000 mg | ORAL_TABLET | Freq: Every day | ORAL | 1 refills | Status: DC

## 2019-01-07 MED ORDER — FLUVOXAMINE MALEATE 50 MG PO TABS: 50.0000 mg | ORAL_TABLET | Freq: Every day | ORAL | 1 refills | Status: DC

## 2019-01-07 MED ORDER — CITALOPRAM HYDROBROMIDE 20 MG PO TABS: 20.0000 mg | ORAL_TABLET | Freq: Every day | ORAL | 1 refills | Status: DC

## 2019-01-07 MED ORDER — LEVOTHYROXINE SODIUM 75 MCG PO TABS: 75.0000 ug | ORAL_TABLET | Freq: Every day | ORAL | 1 refills | Status: DC

## 2019-01-07 MED ORDER — HYDROCHLOROTHIAZIDE 25 MG PO TABS: 25.0000 mg | ORAL_TABLET | Freq: Every day | ORAL | 1 refills | Status: DC

## 2019-01-07 MED ORDER — AMLODIPINE BESYLATE 5 MG PO TABS: 5.0000 mg | ORAL_TABLET | Freq: Every day | ORAL | 1 refills | Status: DC

## 2019-01-07 MED ORDER — FLUTICASONE PROPIONATE 50 MCG/ACT NA SUSP: 2.0000 | Freq: Every day | NASAL | 2 refills | Status: DC

## 2019-01-07 MED ORDER — TRAZODONE HCL 100 MG PO TABS: 100.0000 mg | ORAL_TABLET | Freq: Every day | ORAL | 1 refills | Status: DC

## 2019-01-07 MED ORDER — LOSARTAN POTASSIUM 100 MG PO TABS: 100.0000 mg | ORAL_TABLET | Freq: Every day | ORAL | 1 refills | Status: DC

## 2019-01-07 MED ORDER — SIMVASTATIN 20 MG PO TABS: 20.0000 mg | ORAL_TABLET | Freq: Every day | ORAL | 1 refills | Status: DC

## 2019-01-07 MED ORDER — PANTOPRAZOLE SODIUM 40 MG PO TBEC: 40.0000 mg | DELAYED_RELEASE_TABLET | Freq: Every day | ORAL | 1 refills | Status: DC

## 2019-01-07 MED ORDER — ASPIRIN EC 81 MG PO TBEC: 81.0000 mg | DELAYED_RELEASE_TABLET | Freq: Every day | ORAL | 1 refills | Status: DC

## 2019-01-07 MED ORDER — QUETIAPINE FUMARATE 200 MG PO TABS: 200.0000 mg | ORAL_TABLET | Freq: Every day | ORAL | 1 refills | Status: DC

## 2019-01-07 NOTE — Patient Outreach (Addendum)
[facility]) Care Management  [place] 01/07/2019  [name] July 10, 1960 [id number]  Patient's mother contacted [place] technician with medication questions.   Return call to patient's mother, [name].  Left HIPAA compliant voicemails on home and mobile phone number in [facility].   Plan: -Will f/u again with patient's mother again later this week  Ralene Bathe, PharmD, [place] [phone]   Addendum: Incoming call from patient's mother.  She reports that patient's Rexulti was discontinued and she is not sure what to do with medication received from patient assistance program.  I reviewed that she may discard medication either via police station drop off and in garbage.  Pharmacies cannot accept medication back from patient if unable to guarantee proper storage.  Mother voiced understanding.  No further questions or concerns.  Will keep [place] case closed.   Ralene Bathe, PharmD, [place] [phone]

## 2019-01-07 NOTE — Progress Notes (Signed)
Inpatient Diabetes Program Recommendations  AACE/ADA: New Consensus Statement on Inpatient Glycemic Control (2015)  Target Ranges:  Prepandial:   less than 140 mg/dL      Peak postprandial:   less than 180 mg/dL (1-2 hours)      Critically ill patients:  140 - 180 mg/dL   Lab Results  Component Value Date   GLUCAP 288 (H) 01/07/2019   HGBA1C 7.8 (H) 01/01/2019    Review of Glycemic Control Results for ANIRUDDHA, ROTHMEYER (MRN [id number]) as of 01/07/2019 10:07  Ref. Range 01/05/2019 06:49 01/05/2019 12:04 01/05/2019 16:33 01/05/2019 20:51  Glucose-Capillary Latest Ref Range: 70 - 99 mg/dL 269 (H) 178 (H) 158 (H) 244 (H)   Results for ZAKKERY, MELHORN (MRN [id number]) as of 01/07/2019 10:07  Ref. Range 01/06/2019 07:01 01/06/2019 11:23 01/06/2019 16:25 01/07/2019 07:15  Glucose-Capillary Latest Ref Range: 70 - 99 mg/dL 288 (H) 110 (H) 186 (H) 288 (H)   History: Type 1 Diabetes (makes NO insulin; requires basal, correction, and meal coverage insulin)  Home DM Meds: Insulin Pump                             Jardiance 25 mg Daily  Current Orders: Lantus 25 units Daily                            Novolog Resistant Correction Scale/ SSI (0-20 units) TID AC                            Novolog 5 units TID with meals Inpatient Diabetes Program Recommendations:    Glucose trends dropped to 70's due to amount of Novolog given on 8/28. Patient needs to decrease glucose trends overall.  Consider   -   Increasing Lantus back up to 30 units  -   Decrease Novolog Correction to 0-15 units tid (usually DM type 1 needs 0-9 Sensitive Correction)  -   Add Novolog 0-5 units qhs for bedtime coverage   Thanks,  Adah Perl, RN, BC-ADM Inpatient Diabetes Coordinator Pager [phone] (8a-5p)

## 2019-01-07 NOTE — Progress Notes (Signed)
Patient alert and oriented x 4,affect is blunted , he denies SI/HI/AVH appears less anxious , interacting appropriately with peers and staff. complaint with treatment plan, and took all her prescribed night time medication. Patient has a tracheostomy, its patent, patient is able to suction as needed. 15 minutes safety checks maintained will continue to monitor.

## 2019-01-07 NOTE — Progress Notes (Signed)
Recreation Therapy Notes    Date: 01/07/2019  Time: 9:30 am  Location: Craft room  Behavioral response: Appropriate   Intervention Topic: Necessities   Discussion/Intervention:  Group content on today was focused on necessities. The group defined necessities and how they determine their necessities. Individuals expressed how many necessities they have and if it changes from day to day. Patients described the difference between wants and needs. The group explained how they have overspent on wants in the past. Individuals described a reoccurring necessity for them. The intervention "What I need" helped patients differentiate between wants and needs.  Clinical Observations/Feedback:  Patient came to group late due to unknown reasons. Individual was social with peers and staff while participating in the intervention.    Jaze Rodino LRT/CTRS         Ari Bernabei 01/07/2019 11:07 AM

## 2019-01-07 NOTE — Plan of Care (Signed)
  Problem: Group Participation Goal: STG - Patient will engage in groups without prompting or encouragement from LRT x3 group sessions within 5 recreation therapy group sessions Description: STG - Patient will engage in groups without prompting or encouragement from LRT x3 group sessions within 5 recreation therapy group sessions 01/07/2019 1158 by Ernest Haber, LRT Outcome: Adequate for Discharge 01/07/2019 1158 by Ernest Haber, LRT Outcome: Adequate for Discharge

## 2019-01-07 NOTE — Progress Notes (Signed)
Pt was educated on discharge plan and verbalizes understanding. Pt received medications, medication that was from home, prescriptions, AVS, transition record, suicide risk assessment, belongings, suction machine and all supplies. Collier Bullock RN

## 2019-01-07 NOTE — Progress Notes (Signed)
  [facility] Adult Case Management Discharge Plan :  Will you be returning to the same living situation after discharge:  Yes,  home At discharge, do you have transportation home?: Yes,  family will pick pt up at 1 Do you have the ability to pay for your medications: Yes,  healthte[employer] advantage  Release of information consent forms completed and in the chart;    Patient to Follow up at: Follow-up Information    [facility] Follow up.   Specialty: Behavioral Health Why: A referral for you has been sent on 8/26. Please call them directly at discharge to let them know you are discharged from the hospital and need to be scheduled. Thank You! Contact information: [address] [address] [phone]          Next level of care provider has access to [place] and Suicide Prevention discussed: Yes,  SPE completed with pts wife  Have you used any form of tobacco in the last 30 days? (Cigarettes, Smokeless Tobacco, Cigars, and/or Pipes): No  Has patient been referred to the Quitline?: N/A patient is not a smoker  Patient has been referred for addiction treatment: N/A  Delfin Edis, LCSW 01/07/2019, 9:53 AM

## 2019-01-07 NOTE — [facility] Group Notes (Signed)
  LCSW Group Therapy Note  01/07/2019 1:33 PM   Type of Therapy/Topic:  Group Therapy:  Feelings about Diagnosis  Participation Level:  Did Not Attend   Description of Group:   This group will allow patients to explore their thoughts and feelings about diagnoses they have received. Patients will be guided to explore their level of understanding and acceptance of these diagnoses. Facilitator will encourage patients to process their thoughts and feelings about the reactions of others to their diagnosis and will guide patients in identifying ways to discuss their diagnosis with significant others in their lives. This group will be process-oriented, with patients participating in exploration of their own experiences, giving and receiving support, and processing challenge from other group members.   Therapeutic Goals: 1. Patient will demonstrate understanding of diagnosis as evidenced by identifying two or more symptoms of the disorder 2. Patient will be able to express two feelings regarding the diagnosis 3. Patient will demonstrate their ability to communicate their needs through discussion and/or role play  Summary of Patient Progress: x   Therapeutic Modalities:   Cognitive Behavioral Therapy Brief Therapy Feelings Identification    Evalina Field, MSW, LCSW Clinical Social Work 01/07/2019 1:33 PM

## 2019-01-07 NOTE — Progress Notes (Signed)
Recreation Therapy Notes  INPATIENT RECREATION TR PLAN  Patient Details Name: [name] MRN: [id number] DOB: 10-25-1960 Today's Date: 01/07/2019  Rec Therapy Plan Is patient appropriate for Therapeutic Recreation?: Yes Treatment times per week: at least 3 Estimated Length of Stay: 5-7 days TR Treatment/Interventions: Group participation (Comment)  Discharge Criteria Pt will be discharged from therapy if:: Discharged Treatment plan/goals/alternatives discussed and agreed upon by:: Patient/family  Discharge Summary Short term goals set: Patient will engage in groups without prompting or encouragement from LRT x3 group sessions within 5 recreation therapy group sessions Short term goals met: Adequate for discharge Progress toward goals comments: Groups attended Which groups?: Other (Comment)(Necessities, Relaxation) Reason goals not met: N/A Therapeutic equipment acquired: N/A Reason patient discharged from therapy: Discharge from hospital Pt/family agrees with progress & goals achieved: Yes Date patient discharged from therapy: 01/07/19   [name] 01/07/2019, 11:59 AM

## 2019-01-07 NOTE — Plan of Care (Signed)
Pt rates anxiety and depression 5/10. Pt denies SI, HI and AVH. Pt was educated on care plan and verbalizes understanding. Collier Bullock RN Problem: Education: Goal: Knowledge of [place] [facility] Education information/materials will improve Outcome: Adequate for Discharge Goal: Emotional status will improve Outcome: Adequate for Discharge Goal: Mental status will improve Outcome: Adequate for Discharge Goal: Verbalization of understanding the information provided will improve Outcome: Adequate for Discharge   Problem: Safety: Goal: Periods of time without injury will increase Outcome: Adequate for Discharge   Problem: Education: Goal: Ability to make informed decisions regarding treatment will improve Outcome: Adequate for Discharge   Problem: Health Behavior/Discharge Planning: Goal: Identification of resources available to assist in meeting health care needs will improve Outcome: Adequate for Discharge

## 2019-01-08 ENCOUNTER — Ambulatory Visit: Payer: PPO | Admitting: Pharmacist

## 2019-01-08 NOTE — Discharge Summary (Signed)
Physician Discharge Summary Note  Patient:  [name] is an 58 y.o., male MRN:  [id number] DOB:  May 25, 1960 Patient phone:  [phone] (home)  Patient address:   [place] [address],  Total Time spent with patient: 45 minutes  Date of Admission:  12/31/2018 Date of Discharge: January 07, 2019  Reason for Admission: Patient was admitted because of major depression with suicidal ideation culminating in an actual suicide attempt by drinking petroleum products.  Principal Problem: Major depressive disorder, recurrent severe without psychotic features [facility]) Discharge Diagnoses: Principal Problem:   Major depressive disorder, recurrent severe without psychotic features ([place]) Active Problems:   Hypothyroidism   HTN (hypertension)   Dyslipidemia   GERD (gastroesophageal reflux disease)   Type 1 diabetes mellitus (HCC)   Toxic effect of petroleum products, intentional self-harm, sequela ([place])   Suicide attempt ([place])   Tracheostomy in place [facility])   Past Psychiatric History: Past history of recurrent major depression with several prior suicide attempts  Past Medical History:  Past Medical History:  Diagnosis Date  . Depression   . Diabetes (HCC)    Insulin Pump  . Diabetes mellitus type I ([place])   . Diabetes mellitus without complication ([place])   . GERD (gastroesophageal reflux disease)   . H/O laryngectomy   . Heel bone fracture   . Hyperlipidemia   . Hypertension   . Radicular pain of right lower extremity   . Stroke ([place])   . Suicide attempt [facility]) 2014   damaged larynx - tracheostomy  . Thyroid disease     Past Surgical History:  Procedure Laterality Date  . COLONOSCOPY WITH PROPOFOL N/A 05/15/2018   Procedure: COLONOSCOPY WITH PROPOFOL;  Surgeon: [name], [name], MD;  Location: [facility] ENDOSCOPY;  Service: Gastroenterology;  Laterality: N/A;  . ESOPHAGOGASTRODUODENOSCOPY N/A 05/15/2018   Procedure: ESOPHAGOGASTRODUODENOSCOPY (EGD);  Surgeon: [name],  [name], MD;  Location: [facility] ENDOSCOPY;  Service: Gastroenterology;  Laterality: N/A;  . FRACTURE SURGERY    . Heel bone reconstruction Left   . HERNIA REPAIR  [id number]   Umbilical hernia repair   . LARYNGECTOMY    . NECK SURGERY     fusion  . SPINE SURGERY    . TRACHEOSTOMY  2014   from SI attempt   Family History:  Family History  Problem Relation Age of Onset  . Osteoporosis Mother   . Diabetes Mother   . Hypertension Father    Family Psychiatric  History: Depression Social History:  Social History   Substance and Sexual Activity  Alcohol Use Yes  . Alcohol/week: 2.0 standard drinks  . Types: 2 Shots of liquor per week   Comment: rare     Social History   Substance and Sexual Activity  Drug Use No   Comment: Pt denied; UDS not available    Social History   Socioeconomic History  . Marital status: Married    Spouse name: Not on file  . Number of children: Not on file  . Years of education: Not on file  . Highest education level: Not on file  Occupational History  . Not on file  Social Needs  . Financial resource strain: Not on file  . Food insecurity    Worry: Not on file    Inability: Not on file  . Transportation needs    Medical: Not on file    Non-medical: Not on file  Tobacco Use  . Smoking status: Former Smoker    Packs/day: 0.00  Types: Cigarettes    Quit date: 04/09/2013    Years since quitting: 5.7  . Smokeless tobacco: Never Used  Substance and Sexual Activity  . Alcohol use: Yes    Alcohol/week: 2.0 standard drinks    Types: 2 Shots of liquor per week    Comment: rare  . Drug use: No    Comment: Pt denied; UDS not available  . Sexual activity: Yes    Partners: Female    Birth control/protection: Condom  Lifestyle  . Physical activity    Days per week: Not on file    Minutes per session: Not on file  . Stress: Not on file  Relationships  . Social Herbalist on phone: Not on file    Gets together: Not on file     Attends religious service: Not on file    Active member of club or organization: Not on file    Attends meetings of clubs or organizations: Not on file    Relationship status: Not on file  Other Topics Concern  . Not on file  Social History Narrative  . Not on file    Hospital Course: Patient was admitted to the psychiatric unit.  15-minute checks maintained.  Patient did not display any dangerous aggressive or suicidal behavior on the unit.  He was cooperative with evaluation and treatment.  After reviewing his medicines I suggested that we make some changes.  Taper down on the Luvox.  Started citalopram which he reports has been helpful in the past.  Also started Seroquel for bipolar depression symptoms and sleep.  Patient tolerated med changes well.  Became much more active on the unit.  Hygiene improved.  Started attending groups.  Affect became more appropriate with smiling and appropriate reactions.  By the time of discharge was not reporting any active suicidal ideation at all.  We had some trouble getting his blood sugar stabilized since he did not have his pump but did a reasonably good job I think.  He will continue with his insulin pump and outpatient psychiatric care with his regular provider.  Physical Findings: AIMS:  , ,  ,  ,    CIWA:    COWS:     Musculoskeletal: Strength & Muscle Tone: within normal limits Gait & Station: normal Patient leans: N/A  Psychiatric Specialty Exam: Physical Exam  Nursing note and vitals reviewed. Constitutional: He appears well-developed and well-nourished.  HENT:  Head: Normocephalic and atraumatic.  Eyes: Pupils are equal, round, and reactive to light. Conjunctivae are normal.  Neck: Normal range of motion.  Cardiovascular: Normal heart sounds.  Respiratory: Effort normal.      GI: Soft.  Musculoskeletal: Normal range of motion.  Neurological: He is alert.  Skin: Skin is warm and dry.  Psychiatric: He has a normal mood and  affect. His speech is normal and behavior is normal. Judgment and thought content normal. Cognition and memory are normal.    Review of Systems  Constitutional: Negative.   HENT: Negative.   Eyes: Negative.   Respiratory: Negative.   Cardiovascular: Negative.   Gastrointestinal: Negative.   Musculoskeletal: Negative.   Skin: Negative.   Neurological: Negative.   Psychiatric/Behavioral: Negative.     Blood pressure 134/77, pulse 66, temperature 97.7 F (36.5 C), temperature source Oral, resp. rate 17, height 6' (1.829 m), weight 108.9 kg, SpO2 98 %.Body mass index is 32.55 kg/m.  General Appearance: Casual  Eye Contact:  Good  Speech:  Clear and Coherent  Volume:  Normal  Mood:  Euthymic  Affect:  Congruent  Thought Process:  Coherent  Orientation:  Full (Time, Place, and Person)  Thought Content:  Logical  Suicidal Thoughts:  No  Homicidal Thoughts:  No  Memory:  Immediate;   Fair Recent;   Fair Remote;   Fair  Judgement:  Fair  Insight:  Fair  Psychomotor Activity:  Decreased  Concentration:  Concentration: Fair  Recall:  [place] of Knowledge:  Fair  Language:  Fair  Akathisia:  No  Handed:  Right  AIMS (if indicated):     Assets:  Desire for Improvement  ADL's:  Intact  Cognition:  WNL  Sleep:  Number of Hours: 7.5     Have you used any form of tobacco in the last 30 days? (Cigarettes, Smokeless Tobacco, Cigars, and/or Pipes): No  Has this patient used any form of tobacco in the last 30 days? (Cigarettes, Smokeless Tobacco, Cigars, and/or Pipes) Yes, No  Blood Alcohol level:  Lab Results  Component Value Date   ETH <10 12/31/2018   ETH <10 123XX123    Metabolic Disorder Labs:  Lab Results  Component Value Date   HGBA1C 7.8 (H) 01/01/2019   MPG 177.16 01/01/2019   MPG 174.29 12/23/2018   No results found for: PROLACTIN Lab Results  Component Value Date   CHOL 161 09/15/2016   TRIG 190 (H) 09/15/2016   HDL 41 09/15/2016   CHOLHDL 3.9  09/15/2016   VLDL 38 09/15/2016   [place] 82 09/15/2016    See Psychiatric Specialty Exam and Suicide Risk Assessment completed by Attending Physician prior to discharge.  Discharge destination:  Home  Is patient on multiple antipsychotic therapies at discharge:  No   Has Patient had three or more failed trials of antipsychotic monotherapy by history:  No  Recommended Plan for Multiple Antipsychotic Therapies: NA  Discharge Instructions    Diet - low sodium heart healthy   Complete by: As directed    Increase activity slowly   Complete by: As directed      Allergies as of 01/07/2019      Reactions   Buspar [buspirone]    Makes the patient "flip out"   Depakote [valproic Acid]    Causes excessive drowsiness   Clopidogrel Rash   Gabapentin Itching, Rash      Medication List    STOP taking these medications   acetylcysteine 20 % nebulizer solution Commonly known as: MUCOMYST   losartan-hydrochlorothiazide 100-25 MG tablet Commonly known as: HYZAAR   multivitamin tablet   Rexulti 0.5 MG Tabs Generic drug: Brexpiprazole   vardenafil 20 MG tablet Commonly known as: LEVITRA     TAKE these medications     Indication  amLODipine 5 MG tablet Commonly known as: NORVASC Take 1 tablet (5 mg total) by mouth daily.  Indication: High Blood Pressure Disorder   aspirin EC 81 MG tablet Take 1 tablet (81 mg total) by mouth daily.  Indication: Inflammation   citalopram 20 MG tablet Commonly known as: CELEXA Take 1 tablet (20 mg total) by mouth daily.  Indication: Depression   empagliflozin 25 MG Tabs tablet Commonly known as: JARDIANCE Take 25 mg by mouth daily.  Indication: Type 2 Diabetes   fluticasone 50 MCG/ACT nasal spray Commonly known as: FLONASE Place 2 sprays into both nostrils daily.  Indication: Signs and Symptoms of Nose Diseases   fluvoxaMINE 50 MG tablet Commonly known as: LUVOX Take 1 tablet (50 mg total) by mouth at  bedtime. What changed:    medication strength  how much to take  Indication: Major Depressive Disorder   hydrochlorothiazide 25 MG tablet Commonly known as: HYDRODIURIL Take 1 tablet (25 mg total) by mouth daily.  Indication: High Blood Pressure Disorder   levothyroxine 75 MCG tablet Commonly known as: SYNTHROID Take 1 tablet (75 mcg total) by mouth daily at 6 (six) AM. What changed: when to take this  Indication: Underactive Thyroid   losartan 100 MG tablet Commonly known as: COZAAR Take 1 tablet (100 mg total) by mouth daily.  Indication: High Blood Pressure Disorder   NovoLOG 100 UNIT/ML injection Generic drug: insulin aspart Inject 1.15-1.25 Units into the skin See admin instructions. Via insulin pump-basal rate: 1.15-1.25 units/hr (every 8 carbs = 1 unit of insulin bolus) 0000-0800 = 1.15 units/hr and 0801/2359 = 1.25 units/hr  Indication: Insulin-Dependent Diabetes   pantoprazole 40 MG tablet Commonly known as: PROTONIX Take 1 tablet (40 mg total) by mouth daily. What changed:   when to take this  additional instructions  Indication: Gastroesophageal Reflux Disease   QUEtiapine 200 MG tablet Commonly known as: SEROQUEL Take 1 tablet (200 mg total) by mouth at bedtime.  Indication: Major Depressive Disorder   simvastatin 20 MG tablet Commonly known as: ZOCOR Take 1 tablet (20 mg total) by mouth daily at 6 PM. What changed: when to take this  Indication: High Amount of Triglycerides in the Blood   traMADol 50 MG tablet Commonly known as: ULTRAM Take 2 tablets (100 mg total) by mouth every 6 (six) hours as needed for moderate pain or severe pain. What changed:   when to take this  reasons to take this  Indication: Pain   traZODone 100 MG tablet Commonly known as: DESYREL Take 1 tablet (100 mg total) by mouth at bedtime.  Indication: [facility] Follow up.   Specialty: Behavioral Health Why: A  referral for you has been sent on 8/26. Please call them directly at discharge to let them know you are discharged from the hospital and need to be scheduled. Thank You! Contact information: [place] [phone]          Follow-up recommendations:  Activity:  Activity as tolerated Diet:  Diabetic diet Other:  Follow-up with outpatient care with usual psychiatrist  Comments: Psychoeducation and supportive counseling and review of treatment plan.  Prescriptions given at discharge  Signed: Alethia Berthold, MD 01/08/2019, 4:30 PM

## 2019-01-09 DIAGNOSIS — R809 Proteinuria, unspecified: Secondary | ICD-10-CM | POA: Diagnosis not present

## 2019-01-09 DIAGNOSIS — E1029 Type 1 diabetes mellitus with other diabetic kidney complication: Secondary | ICD-10-CM | POA: Diagnosis not present

## 2019-01-15 ENCOUNTER — Encounter: Payer: Self-pay | Admitting: Psychiatry

## 2019-01-15 ENCOUNTER — Ambulatory Visit (INDEPENDENT_AMBULATORY_CARE_PROVIDER_SITE_OTHER): Payer: PPO | Admitting: Psychiatry

## 2019-01-15 ENCOUNTER — Other Ambulatory Visit: Payer: Self-pay

## 2019-01-15 DIAGNOSIS — Z8673 Personal history of transient ischemic attack (TIA), and cerebral infarction without residual deficits: Secondary | ICD-10-CM | POA: Insufficient documentation

## 2019-01-15 DIAGNOSIS — F331 Major depressive disorder, recurrent, moderate: Secondary | ICD-10-CM

## 2019-01-15 DIAGNOSIS — F411 Generalized anxiety disorder: Secondary | ICD-10-CM

## 2019-01-15 MED ORDER — CITALOPRAM HYDROBROMIDE 20 MG PO TABS: 30.0000 mg | ORAL_TABLET | Freq: Every day | ORAL | 1 refills | Status: DC

## 2019-01-15 NOTE — Progress Notes (Signed)
Virtual Visit via Video Note  I connected with [name] on 01/15/19 at  3:00 PM EDT by a video enabled telemedicine application and verified that I am speaking with the correct person using two identifiers.   I discussed the limitations of evaluation and management by telemedicine and the availability of in person appointments. The patient expressed understanding and agreed to proceed.   I discussed the assessment and treatment plan with the patient. The patient was provided an opportunity to ask questions and all were answered. The patient agreed with the plan and demonstrated an understanding of the instructions.   The patient was advised to call back or seek an in-person evaluation if the symptoms worsen or if the condition fails to improve as anticipated.  Provider Location : [facility] Patient Location : Home    Psychiatric Initial Adult Assessment   Patient Identification: [name] MRN:  [id number] Date of Evaluation:  01/15/2019 Referral Source: Dr.Marcus [id number] Chief Complaint:   Chief Complaint    Establish Care     Visit Diagnosis:    ICD-10-CM   1. Moderate episode of recurrent major depressive disorder (HCC)  F33.1 citalopram (CELEXA) 20 MG tablet    Comprehensive metabolic panel  2. GAD (generalized anxiety disorder)  F41.1 citalopram (CELEXA) 20 MG tablet    Comprehensive metabolic panel    History of Present Illness:  [name] is a 58 year old Caucasian male, married, lives in [place], has a history of depression, anxiety, multiple medical problems , history of tracheal stenosis post multiple re-intubations all during one hospitalization post acute hypoxemic respiratory failure due to opioid overdose, history of CVA with right-sided hemiparesis currently recovered, diabetes, hypertension, NSTEMI, hypothyroidism, hyperlipidemia, chronic pain, was evaluated by telemedicine.   Patient was recently discharged from behavioral health inpatient hospital at  [facility]- admitted from 8/26-01/07/2019.  Patient per review of medical records-notes per Dr. Alethia Berthold dated 01/01/2019-' patient with long history of depression came to the hospital after a suicide attempt.  He woke up yesterday feeling overwhelmed by all his problems and decided to kill himself.  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  Patient was admitted to the hospital.  He was on Luvox on Rexulti.  His medications were readjusted.  He was started on Seroquel.  Luvox was tapered down to be discontinued.  He was stabilized on medications and was discharged on Celexa 20 mg.  Along with Seroquel and trazodone.'  Patient today appeared to be a limited historian.  He is status post tracheostomy and hence had difficulty verbalizing and had to talk with some effort.  Patient's wife-[name] as well as mother-[name] -  provided collateral information.  Patient today reports he continues to struggle with lack of energy, lack of motivation and sadness.  He currently denies any suicidality, homicidality or perceptual disturbances.  He reports sleep is good on the Seroquel.  He however continues to struggle with racing thoughts at night.  He however reports the Seroquel does help to some extent with the same.  Patient currently reports that he has several psychosocial stressors like multiple health problems, the current COVID 19 pandemic, physical limitations.  Patient reports he has had multiple inpatient mental health admissions in the past and also had at least 4 suicide attempts in the past.  He however  currently seems to be motivated to start treatment, is interested in psychotherapy sessions.  Per collateral information from mother-[name] -who is also the healthcare power of attorney- patient may be able to get therapy as well as psychiatric  medication management from [facility].  She reports she wants to reach out to them and see if they will provide him in-house therapy.  She does not want him to be scheduled with therapist here in our office yet.    Associated Signs/Symptoms: Depression Symptoms:  depressed mood, anhedonia, hypersomnia, psychomotor retardation, fatigue, difficulty concentrating, (Hypo) Manic Symptoms:  Denies Anxiety Symptoms:  Excessive Worry, Psychotic Symptoms:  Denies PTSD Symptoms: Negative  Past Psychiatric History: Patient with past psychiatric history- history of depression since the past several years.  His first admission to the inpatient unit was in 2014.  He had at least 4  suicide attempts in his life.  Patient has tried multiple medications in the past.  He used to be under the care of Dr. Daron Offer in [place].  He may also have been under the care of Dr. Nicolasa Ducking in the past.  Previous Psychotropic Medications: Yes Fluvoxamine, Rexulti, Xanax, abilify  Substance Abuse History in the last 12 months:  No.  Consequences of Substance Abuse: Negative  Past Medical History:  Past Medical History:  Diagnosis Date  . Depression   . Diabetes (HCC)    Insulin Pump  . Diabetes mellitus type I ([place])   . Diabetes mellitus without complication ([place])   . GERD (gastroesophageal reflux disease)   . H/O laryngectomy   . Heel bone fracture   . Hyperlipidemia   . Hypertension   . Radicular pain of right lower extremity   . Stroke ([place])   . Suicide attempt [facility]) 2014   damaged larynx - tracheostomy  . Thyroid disease     Past Surgical History:  Procedure Laterality Date  . COLONOSCOPY WITH PROPOFOL N/A 05/15/2018   Procedure: COLONOSCOPY WITH PROPOFOL;  Surgeon: [name], [name], MD;  Location: [facility] ENDOSCOPY;  Service: Gastroenterology;  Laterality: N/A;  . ESOPHAGOGASTRODUODENOSCOPY N/A 05/15/2018   Procedure: ESOPHAGOGASTRODUODENOSCOPY (EGD);  Surgeon: [name], [name], MD;   Location: [facility] ENDOSCOPY;  Service: Gastroenterology;  Laterality: N/A;  . FRACTURE SURGERY    . Heel bone reconstruction Left   . HERNIA REPAIR  [id number]   Umbilical hernia repair   . LARYNGECTOMY    . NECK SURGERY     fusion  . SPINE SURGERY    . TRACHEOSTOMY  2014   from [place] attempt    Family Psychiatric History: Denies history of mental health problems in his family  Family History:  Family History  Problem Relation Age of Onset  . Osteoporosis Mother   . Diabetes Mother   . Hypertension Father   . Mental illness Neg Hx     Social History:   Social History   Socioeconomic History  . Marital status: Married    Spouse name: Not on file  . Number of children: Not on file  . Years of education: Not on file  . Highest education level: Not on file  Occupational History  . Not on file  Social Needs  . Financial resource strain: Not on file  . Food insecurity    Worry: Not on file    Inability: Not on file  . Transportation needs    Medical: Not on file    Non-medical: Not on file  Tobacco Use  . Smoking status: Former Smoker  Packs/day: 0.00    Types: Cigarettes    Quit date: 04/09/2013    Years since quitting: 5.7  . Smokeless tobacco: Never Used  Substance and Sexual Activity  . Alcohol use: Yes    Alcohol/week: 2.0 standard drinks    Types: 2 Shots of liquor per week    Comment: rare  . Drug use: No    Comment: Pt denied; UDS not available  . Sexual activity: Yes    Partners: Female    Birth control/protection: Condom  Lifestyle  . Physical activity    Days per week: Not on file    Minutes per session: Not on file  . Stress: Not on file  Relationships  . Social Herbalist on phone: Not on file    Gets together: Not on file    Attends religious service: Not on file    Active member of club or organization: Not on file    Attends meetings of clubs or organizations: Not on file    Relationship status: Not on file  Other Topics Concern  .  Not on file  Social History Narrative  . Not on file    Additional Social History: Patient is currently on disability.  He has been married 4 times.  He is currently with his fourth wife.  He has 4 adult children.  He graduated high school .  He has good support system from his mother. Allergies:   Allergies  Allergen Reactions  . Buspar [Buspirone]     Makes the patient "flip out"  . Depakote [Valproic Acid]     Causes excessive drowsiness  . Clopidogrel Rash  . Gabapentin Itching and Rash    Metabolic Disorder Labs: Lab Results  Component Value Date   HGBA1C 7.8 (H) 01/01/2019   MPG 177.16 01/01/2019   MPG 174.29 12/23/2018   No results found for: PROLACTIN Lab Results  Component Value Date   CHOL 161 09/15/2016   TRIG 190 (H) 09/15/2016   HDL 41 09/15/2016   CHOLHDL 3.9 09/15/2016   VLDL 38 09/15/2016   LDLCALC 82 09/15/2016   Lab Results  Component Value Date   TSH 1.952 09/15/2016    Therapeutic Level Labs: No results found for: LITHIUM No results found for: CBMZ Lab Results  Component Value Date   VALPROATE 19 (L) 09/12/2016    Current Medications: Current Outpatient Medications  Medication Sig Dispense Refill  . acetylcysteine (MUCOMYST) 20 % nebulizer solution acetylcysteine 200 mg/mL (20 %) solution  INHALE 4 MLS INTO THE LUNGS EVERY 6 HOURS    . albuterol (ACCUNEB) 1.25 MG/3ML nebulizer solution albuterol sulfate 1.25 mg/3 mL solution for nebulization  TAKE 3 MLS BY NEBULIZATION EVERY 6 HOURS AS NEEDED FOR WHEEZING    . amLODipine (NORVASC) 5 MG tablet Take 1 tablet (5 mg total) by mouth daily. 30 tablet 1  . aspirin EC 81 MG tablet Take 1 tablet (81 mg total) by mouth daily. 30 tablet 1  . citalopram (CELEXA) 20 MG tablet Take 1.5 tablets (30 mg total) by mouth daily. 45 tablet 1  . empagliflozin (JARDIANCE) 25 MG TABS tablet Take 25 mg by mouth daily. 30 tablet 1  . fluticasone (FLONASE) 50 MCG/ACT nasal spray Place 2 sprays into both nostrils  daily. 16 g 2  . hydrochlorothiazide (HYDRODIURIL) 25 MG tablet Take 1 tablet (25 mg total) by mouth daily. 30 tablet 1  . insulin aspart (NOVOLOG) 100 UNIT/ML injection Inject 1.15-1.25 Units into the skin See  admin instructions. Via insulin pump-basal rate: 1.15-1.25 units/hr (every 8 carbs = 1 unit of insulin bolus) 0000-0800 = 1.15 units/hr and 0801/2359 = 1.25 units/hr    . levothyroxine (SYNTHROID) 75 MCG tablet Take 1 tablet (75 mcg total) by mouth daily at 6 (six) AM. 30 tablet 1  . losartan (COZAAR) 100 MG tablet Take 1 tablet (100 mg total) by mouth daily. 30 tablet 1  . pantoprazole (PROTONIX) 40 MG tablet Take 1 tablet (40 mg total) by mouth daily. 30 tablet 1  . QUEtiapine (SEROQUEL) 200 MG tablet Take 1 tablet (200 mg total) by mouth at bedtime. 30 tablet 1  . simvastatin (ZOCOR) 20 MG tablet Take 1 tablet (20 mg total) by mouth daily at 6 PM. 30 tablet 1  . traMADol (ULTRAM) 50 MG tablet Take 2 tablets (100 mg total) by mouth every 6 (six) hours as needed for moderate pain or severe pain. 60 tablet 1  . traZODone (DESYREL) 100 MG tablet Take 1 tablet (100 mg total) by mouth at bedtime. 30 tablet 1   No current facility-administered medications for this visit.     Musculoskeletal: Strength & Muscle Tone: UTA Gait & Station: Observed as seated Patient leans: N/A  Psychiatric Specialty Exam: Review of Systems  Psychiatric/Behavioral: Positive for depression. The patient is nervous/anxious.   All other systems reviewed and are negative.   There were no vitals taken for this visit.There is no height or weight on file to calculate BMI.  General Appearance: Casual  Eye Contact:  Fair  Speech:  Slow hx of  tracehostomy  Volume:  Normal  Mood:  Anxious and Depressed  Affect:  Appropriate  Thought Process:  Goal Directed and Descriptions of Associations: Intact  Orientation:  Full (Time, Place, and Person)  Thought Content:  Logical  Suicidal Thoughts:  No  Homicidal Thoughts:   No  Memory:  Immediate;   Fair Recent;   Fair Remote;   Fair  Judgement:  Fair  Insight:  Fair  Psychomotor Activity:  Normal  Concentration:  Concentration: Fair and Attention Span: Fair  Recall:  [employer] of Knowledge:Fair  Language: Fair  Akathisia:  No  Handed:  Right  AIMS (if indicated): Denies tremors, rigidity, stiffness  Assets:  Communication Skills Desire for Improvement Social Support  ADL's:  Intact  Cognition: WNL  Sleep:  Fair   Screenings: AUDIT     Admission (Discharged) from 12/31/2018 in [facility] Admission (Discharged) from 09/14/2016 in [facility]  Alcohol Use Disorder Identification Test Final Score (AUDIT)  0  1    PHQ2-9     Patient Outreach Telephone from 12/12/2017 in [facility] from 08/22/2016 in [facility]  PHQ-2 Total Score  0  2  PHQ-9 Total Score  -  4      Assessment and Plan: Joie is a 58 year old Caucasian male, married, on disability, has a history of multiple medical problems, ADHD, MDD, was evaluated by telemedicine today.  Patient is biologically predisposed given his multiple health problems.  He has several psychosocial stressors including his physical limitations and chronic mental health issues, current pandemic.  Patient was recently discharged from inpatient mental health admission after a suicide attempt on 01/07/2019.  Patient will continue to benefit from medication management as well as psychotherapy sessions since he continues to struggle with mood symptoms.  Plan MDD-unstable Increase Celexa to 30 mg p.o. daily Discontinue fluvoxamine. Continue Seroquel 200 mg p.o. nightly  For GAD-unstable Celexa as prescribed   Patient will benefit from monitoring and repeat labs- recent sodium level 2 weeks ago 01/01/2019- low at 133.  He however has a history of chronic hyponatremia.  His creatinine level also appears to be chronically elevated.   Discussed with patient the effect of SSRIs on his sodium level and advised him to follow-up with his primary medical doctor for repeat labs.  Reviewed EKG - 12/31/2018 - [facility] - WNL.  Discussed with patient about starting cognitive behavioral therapy.  However per mother [facility] and may be able to get therapist in house.  She wants to reach out to them first before scheduling with therapist here in our office.  Discussed with patient to sign a release to obtain medical records from Dr. Daron Offer.  Follow-up in clinic in 3 to 4 weeks or sooner if needed.  I have spent atleast 40 minutes non  face to face with patient today. More than 50 % of the time was spent for psychoeducation and supportive psychotherapy and care coordination. This note was generated in part or whole with voice recognition software. Voice recognition is usually quite accurate but there are transcription errors that can and very often do occur. I apologize for any typographical errors that were not detected and corrected.         Ursula Alert, MD 9/9/20205:19 PM

## 2019-01-16 ENCOUNTER — Ambulatory Visit: Payer: PPO | Admitting: Licensed Clinical Social Worker

## 2019-01-16 DIAGNOSIS — E1022 Type 1 diabetes mellitus with diabetic chronic kidney disease: Secondary | ICD-10-CM | POA: Diagnosis not present

## 2019-01-16 DIAGNOSIS — E7800 Pure hypercholesterolemia, unspecified: Secondary | ICD-10-CM | POA: Diagnosis not present

## 2019-01-16 DIAGNOSIS — N183 Chronic kidney disease, stage 3 (moderate): Secondary | ICD-10-CM | POA: Diagnosis not present

## 2019-01-16 DIAGNOSIS — Z125 Encounter for screening for malignant neoplasm of prostate: Secondary | ICD-10-CM | POA: Diagnosis not present

## 2019-01-16 DIAGNOSIS — Z79899 Other long term (current) drug therapy: Secondary | ICD-10-CM | POA: Diagnosis not present

## 2019-01-16 DIAGNOSIS — E039 Hypothyroidism, unspecified: Secondary | ICD-10-CM | POA: Diagnosis not present

## 2019-01-18 DIAGNOSIS — R809 Proteinuria, unspecified: Secondary | ICD-10-CM | POA: Diagnosis not present

## 2019-01-18 DIAGNOSIS — E1029 Type 1 diabetes mellitus with other diabetic kidney complication: Secondary | ICD-10-CM | POA: Diagnosis not present

## 2019-01-19 DIAGNOSIS — J4530 Mild persistent asthma, uncomplicated: Secondary | ICD-10-CM | POA: Diagnosis not present

## 2019-01-21 ENCOUNTER — Other Ambulatory Visit: Payer: Self-pay

## 2019-01-21 ENCOUNTER — Emergency Department
Admission: EM | Admit: 2019-01-21 | Discharge: 2019-01-22 | Disposition: A | Discharge status: Other Acute Inpatient Hospital | Payer: PPO | Attending: Emergency Medicine | Admitting: Emergency Medicine

## 2019-01-21 ENCOUNTER — Encounter: Payer: Self-pay | Admitting: Emergency Medicine

## 2019-01-21 DIAGNOSIS — R45851 Suicidal ideations: Secondary | ICD-10-CM | POA: Diagnosis not present

## 2019-01-21 DIAGNOSIS — Z03818 Encounter for observation for suspected exposure to other biological agents ruled out: Secondary | ICD-10-CM | POA: Diagnosis not present

## 2019-01-21 DIAGNOSIS — Z8673 Personal history of transient ischemic attack (TIA), and cerebral infarction without residual deficits: Secondary | ICD-10-CM | POA: Insufficient documentation

## 2019-01-21 DIAGNOSIS — Z794 Long term (current) use of insulin: Secondary | ICD-10-CM | POA: Insufficient documentation

## 2019-01-21 DIAGNOSIS — F332 Major depressive disorder, recurrent severe without psychotic features: Secondary | ICD-10-CM | POA: Diagnosis not present

## 2019-01-21 DIAGNOSIS — Z7982 Long term (current) use of aspirin: Secondary | ICD-10-CM | POA: Insufficient documentation

## 2019-01-21 DIAGNOSIS — Z79899 Other long term (current) drug therapy: Secondary | ICD-10-CM | POA: Diagnosis not present

## 2019-01-21 DIAGNOSIS — Z20828 Contact with and (suspected) exposure to other viral communicable diseases: Secondary | ICD-10-CM | POA: Diagnosis not present

## 2019-01-21 DIAGNOSIS — E039 Hypothyroidism, unspecified: Secondary | ICD-10-CM | POA: Insufficient documentation

## 2019-01-21 DIAGNOSIS — I252 Old myocardial infarction: Secondary | ICD-10-CM | POA: Insufficient documentation

## 2019-01-21 DIAGNOSIS — I10 Essential (primary) hypertension: Secondary | ICD-10-CM | POA: Diagnosis not present

## 2019-01-21 DIAGNOSIS — E119 Type 2 diabetes mellitus without complications: Secondary | ICD-10-CM | POA: Insufficient documentation

## 2019-01-21 DIAGNOSIS — Z23 Encounter for immunization: Secondary | ICD-10-CM | POA: Diagnosis not present

## 2019-01-21 DIAGNOSIS — F329 Major depressive disorder, single episode, unspecified: Secondary | ICD-10-CM

## 2019-01-21 DIAGNOSIS — Z87891 Personal history of nicotine dependence: Secondary | ICD-10-CM | POA: Diagnosis not present

## 2019-01-21 DIAGNOSIS — F32A Depression, unspecified: Secondary | ICD-10-CM

## 2019-01-21 LAB — ETHANOL: Alcohol, Ethyl (B): <10 mg/dL (ref <10)

## 2019-01-21 LAB — COMPREHENSIVE METABOLIC PANEL
ALT: 16 U/L (ref 0–44)
AST: 24 U/L (ref 15–41)
Albumin: 3.7 g/dL (ref 3.5–5.0)
Alkaline Phosphatase: 116 U/L (ref 38–126)
Anion gap: 14 (ref 5–15)
BUN: 22 mg/dL — ABNORMAL HIGH (ref 6–20)
CO2: 20 mmol/L — ABNORMAL LOW (ref 22–32)
Calcium: 9.3 mg/dL (ref 8.9–10.3)
Chloride: 100 mmol/L (ref 98–111)
Creatinine, Ser: 1.47 mg/dL — ABNORMAL HIGH (ref 0.61–1.24)
GFR calc Af Amer: >60 mL/min (ref >60)
GFR calc non Af Amer: 53 mL/min — ABNORMAL LOW (ref >60)
Glucose, Bld: 284 mg/dL — ABNORMAL HIGH (ref 70–99)
Potassium: 3.5 mmol/L (ref 3.5–5.1)
Sodium: 134 mmol/L — ABNORMAL LOW (ref 135–145)
Total Bilirubin: 0.8 mg/dL (ref 0.3–1.2)
Total Protein: 6.9 g/dL (ref 6.5–8.1)

## 2019-01-21 LAB — CBC
HCT: 47.3 % (ref 39.0–52.0)
Hemoglobin: 15.9 g/dL (ref 13.0–17.0)
MCH: 29.3 pg (ref 26.0–34.0)
MCHC: 33.6 g/dL (ref 30.0–36.0)
MCV: 87.3 fL (ref 80.0–100.0)
Platelets: 291 10*3/uL (ref 150–400)
RBC: 5.42 MIL/uL (ref 4.22–5.81)
RDW: 13.8 % (ref 11.5–15.5)
WBC: 9 10*3/uL (ref 4.0–10.5)
nRBC: 0 % (ref 0.0–0.2)

## 2019-01-21 LAB — URINE DRUG SCREEN, QUALITATIVE (ARMC ONLY)
Amphetamines, Ur Screen: NOT DETECTED
Barbiturates, Ur Screen: NOT DETECTED
Benzodiazepine, Ur Scrn: NOT DETECTED
Cannabinoid 50 Ng, Ur ~~LOC~~: NOT DETECTED
Cocaine Metabolite,Ur ~~LOC~~: POSITIVE — AB
MDMA (Ecstasy)Ur Screen: NOT DETECTED
Methadone Scn, Ur: NOT DETECTED
Opiate, Ur Screen: NOT DETECTED
Phencyclidine (PCP) Ur S: NOT DETECTED
Tricyclic, Ur Screen: NOT DETECTED

## 2019-01-21 LAB — GLUCOSE, CAPILLARY: Glucose-Capillary: 109 mg/dL — ABNORMAL HIGH (ref 70–99)

## 2019-01-21 LAB — SALICYLATE LEVEL: Salicylate Lvl: <7 mg/dL (ref 2.8–30.0)

## 2019-01-21 LAB — ACETAMINOPHEN LEVEL: Acetaminophen (Tylenol), Serum: <10 ug/mL — ABNORMAL LOW (ref 10–30)

## 2019-01-21 MED ORDER — INFLUENZA VAC SPLIT QUAD 0.5 ML IM SUSY
0.5000 mL | PREFILLED_SYRINGE | INTRAMUSCULAR | Status: AC
  Administered 2019-01-22: 09:00:00 0.5 mL via INTRAMUSCULAR
  Filled 2019-01-21: qty 0.5

## 2019-01-21 MED ORDER — CITALOPRAM HYDROBROMIDE 20 MG PO TABS
30.0000 mg | ORAL_TABLET | Freq: Every day | ORAL | Status: DC
  Administered 2019-01-21 – 2019-01-22 (×2): 30 mg via ORAL
  Filled 2019-01-21 (×2): qty 2

## 2019-01-21 MED ORDER — ASPIRIN EC 81 MG PO TBEC
81.0000 mg | DELAYED_RELEASE_TABLET | Freq: Every day | ORAL | Status: DC
  Administered 2019-01-21 – 2019-01-22 (×2): 81 mg via ORAL
  Filled 2019-01-21 (×2): qty 1

## 2019-01-21 MED ORDER — LOSARTAN POTASSIUM 50 MG PO TABS
100.0000 mg | ORAL_TABLET | Freq: Every day | ORAL | Status: DC
  Administered 2019-01-21 – 2019-01-22 (×2): 100 mg via ORAL
  Filled 2019-01-21 (×2): qty 2

## 2019-01-21 MED ORDER — PANTOPRAZOLE SODIUM 40 MG PO TBEC
40.0000 mg | DELAYED_RELEASE_TABLET | Freq: Two times a day (BID) | ORAL | Status: DC
  Administered 2019-01-21 – 2019-01-22 (×2): 40 mg via ORAL
  Filled 2019-01-21 (×2): qty 1

## 2019-01-21 MED ORDER — AMLODIPINE BESYLATE 5 MG PO TABS
5.0000 mg | ORAL_TABLET | Freq: Every day | ORAL | Status: DC
  Administered 2019-01-21 – 2019-01-22 (×2): 5 mg via ORAL
  Filled 2019-01-21 (×2): qty 1

## 2019-01-21 MED ORDER — LEVOTHYROXINE SODIUM 50 MCG PO TABS
75.0000 ug | ORAL_TABLET | Freq: Every day | ORAL | Status: DC
  Administered 2019-01-22: 75 ug via ORAL
  Filled 2019-01-21: qty 2

## 2019-01-21 MED ORDER — TRAZODONE HCL 100 MG PO TABS
100.0000 mg | ORAL_TABLET | Freq: Every day | ORAL | Status: DC
  Administered 2019-01-21: 100 mg via ORAL
  Filled 2019-01-21: qty 1

## 2019-01-21 MED ORDER — EMPAGLIFLOZIN 25 MG PO TABS
25.0000 mg | ORAL_TABLET | Freq: Every day | ORAL | Status: DC
  Filled 2019-01-21 (×2): qty 25

## 2019-01-21 MED ORDER — HYDROCHLOROTHIAZIDE 25 MG PO TABS
25.0000 mg | ORAL_TABLET | Freq: Every day | ORAL | Status: DC
  Administered 2019-01-21 – 2019-01-22 (×2): 25 mg via ORAL
  Filled 2019-01-21 (×2): qty 1

## 2019-01-21 MED ORDER — LORAZEPAM 2 MG PO TABS
2.0000 mg | ORAL_TABLET | Freq: Once | ORAL | Status: AC
  Administered 2019-01-21: 2 mg via ORAL
  Filled 2019-01-21: qty 1

## 2019-01-21 MED ORDER — SIMVASTATIN 10 MG PO TABS
20.0000 mg | ORAL_TABLET | Freq: Every day | ORAL | Status: DC
  Administered 2019-01-22: 20 mg via ORAL
  Filled 2019-01-21: qty 2

## 2019-01-21 MED ORDER — QUETIAPINE FUMARATE 200 MG PO TABS
200.0000 mg | ORAL_TABLET | Freq: Every day | ORAL | Status: DC
  Administered 2019-01-21: 200 mg via ORAL
  Filled 2019-01-21: qty 1

## 2019-01-21 MED ORDER — FLUTICASONE PROPIONATE 50 MCG/ACT NA SUSP
2.0000 | Freq: Every day | NASAL | Status: DC
  Administered 2019-01-22: 2 via NASAL
  Filled 2019-01-21: qty 16

## 2019-01-21 MED ORDER — INSULIN ASPART 100 UNIT/ML ~~LOC~~ SOLN: 1.1500 [IU] | SUBCUTANEOUS | Status: DC

## 2019-01-21 MED ORDER — LOSARTAN POTASSIUM-HCTZ 100-25 MG PO TABS: 1.0000 | ORAL_TABLET | Freq: Every day | ORAL | Status: DC

## 2019-01-21 MED ORDER — TRAMADOL HCL 50 MG PO TABS
100.0000 mg | ORAL_TABLET | Freq: Four times a day (QID) | ORAL | Status: DC | PRN
  Administered 2019-01-21: 100 mg via ORAL
  Filled 2019-01-21: qty 2

## 2019-01-21 MED ORDER — ALBUTEROL SULFATE (2.5 MG/3ML) 0.083% IN NEBU: 2.5000 mg | INHALATION_SOLUTION | Freq: Three times a day (TID) | RESPIRATORY_TRACT | Status: DC | PRN

## 2019-01-21 NOTE — Social Work (Signed)
CSW received a phone call from [facility] stating that they were getting ready to have a face to face meeting with patient and an NP and [place] Specialist when patient stated that he wanted to kill himself.  [facility] would like to be informed when patient discharges, so they can be involved in patient's care.   Office number:    [phone]    [place] [place]  [phone]

## 2019-01-21 NOTE — ED Notes (Signed)
Report to include Situation, Background, Assessment, and Recommendations received fromAmy RN. Patient alert and oriented, warm and dry, in no acute distress. Patient report SI and denied HI, AVH and pain. Patient made aware of Q15 minute rounds and Engineer, drilling presence for their safety. Patient instructed to come to me with needs or concerns.

## 2019-01-21 NOTE — ED Notes (Signed)
BEHAVIORAL HEALTH ROUNDING Patient sleeping: No. Patient alert and oriented: yes Behavior appropriate: Yes.  ; If no, describe:  Nutrition and fluids offered: yes Toileting and hygiene offered: Yes  Sitter present: q15 minute observations  ENVIRONMENTAL ASSESSMENT Potentially harmful objects out of patient reach: Yes.   Personal belongings secured: Yes.   Patient dressed in hospital provided attire only: Yes.   Plastic bags out of patient reach: Yes.   Patient care equipment (cords, cables, call bells, lines, and drains) shortened, removed, or accounted for: Yes.   Equipment and supplies removed from bottom of stretcher: Yes.   Potentially toxic materials out of patient reach: Yes.   Sharps container removed or out of patient reach: Yes.

## 2019-01-21 NOTE — ED Notes (Signed)
Pt given sandwich tray and shasta sprite. No other needs voiced at this time. Will continue to monitor Q15 minute rounds.

## 2019-01-21 NOTE — ED Notes (Signed)
Hourly rounding reveals patient in room. No complaints, stable, in no acute distress. Q15 minute rounds and monitoring via Rover and Officer to continue.   

## 2019-01-21 NOTE — ED Notes (Signed)
His wedding ring was not removed during dress out - pt verbalizes that he will do no harm with it

## 2019-01-21 NOTE — ED Notes (Signed)
BEHAVIORAL HEALTH ROUNDING Patient sleeping: No. Patient alert and oriented: yes Behavior appropriate: Yes.  ; If no, describe:  Nutrition and fluids offered: yes Toileting and hygiene offered: Yes  Sitter present: q15 minute observations  

## 2019-01-21 NOTE — ED Provider Notes (Signed)
[facility] Emergency Department Provider Note   ____________________________________________   First MD Initiated Contact with Patient 01/21/19 1514     (approximate)  I have reviewed the triage vital signs and the nursing notes.   HISTORY  Chief Complaint Psychiatric Evaluation    HPI [name] is a 58 y.o. male reports his medications do not seem to be working.  He will be fine for a while and he will get frustrated sometimes by the medications because of so many of them and he wanted kill himself again.  He is afraid he is going to kill himself 1 of these times when he gets very frustrated.  He was here recently with suicidal ideation also.  Patient reports if he eats too much he vomits.         Past Medical History:  Diagnosis Date  . Depression   . Diabetes (HCC)    Insulin Pump  . Diabetes mellitus type I ([place])   . Diabetes mellitus without complication ([place])   . GERD (gastroesophageal reflux disease)   . H/O laryngectomy   . Heel bone fracture   . Hyperlipidemia   . Hypertension   . Radicular pain of right lower extremity   . Stroke ([place])   . Suicide attempt [facility]) 2014   damaged larynx - tracheostomy  . Thyroid disease     Patient Active Problem List   Diagnosis Date Noted  . History of embolic stroke [id number]  . Moderate episode of recurrent major depressive disorder ([place]) 01/15/2019  . GAD (generalized anxiety disorder) 01/15/2019  . Toxic effect of petroleum products, intentional self-harm, sequela ([place]) 01/01/2019  . Suicide attempt ([place]) 01/01/2019  . Tracheostomy in place [facility]) 01/01/2019  . Closed fracture of lateral malleolus of right fibula 11/18/2018  . Type 1 diabetes mellitus ([place]) 09/14/2016  . Major depressive disorder, recurrent severe without psychotic features ([place]) 09/13/2016  . Hypothyroidism 09/13/2016  . HTN (hypertension) 09/13/2016  . Dyslipidemia 09/13/2016  . GERD (gastroesophageal reflux  disease) 09/13/2016  . H/O laryngectomy 06/30/2014  . Dysphagia 06/22/2014  . Anxiety state 12/22/2013  . Acute embolism and thrombosis of unspecified deep veins of unspecified lower extremity ([place]) 09/15/2013  . History of tracheostomy 09/15/2013  . Hyperlipidemia 09/15/2013  . Hyperlipidemia due to type 1 diabetes mellitus ([place]) 09/15/2013  . Airway obstruction 08/19/2013  . Subglottic stenosis 08/19/2013  . Laryngeal stridor 06/19/2013  . NSTEMI (non-ST elevated myocardial infarction) ([place]) 06/19/2013  . Tobacco dependence in remission 06/12/2013  . Autonomic instability 06/04/2013  . Encephalopathy 05/21/2013  . Pneumonia 05/21/2013  . Acute kidney injury ([place]) 05/09/2013  . Disease characterized by destruction of skeletal muscle 05/09/2013  . Leukocytosis 05/09/2013  . Major depressive disorder, single episode, unspecified 05/09/2013  . Transaminitis 05/09/2013    Past Surgical History:  Procedure Laterality Date  . COLONOSCOPY WITH PROPOFOL N/A 05/15/2018   Procedure: COLONOSCOPY WITH PROPOFOL;  Surgeon: [name], [name], MD;  Location: [facility] ENDOSCOPY;  Service: Gastroenterology;  Laterality: N/A;  . ESOPHAGOGASTRODUODENOSCOPY N/A 05/15/2018   Procedure: ESOPHAGOGASTRODUODENOSCOPY (EGD);  Surgeon: [name], [name], MD;  Location: [facility] ENDOSCOPY;  Service: Gastroenterology;  Laterality: N/A;  . FRACTURE SURGERY    . Heel bone reconstruction Left   . HERNIA REPAIR  [id number]   Umbilical hernia repair   . LARYNGECTOMY    . NECK SURGERY     fusion  . SPINE SURGERY    . TRACHEOSTOMY  2014   from SI attempt  Prior to Admission medications   Medication Sig Start Date End Date Taking? Authorizing Provider  acetylcysteine (MUCOMYST) 20 % nebulizer solution acetylcysteine 200 mg/mL (20 %) solution  INHALE 4 MLS INTO THE LUNGS EVERY 6 HOURS 12/17/18   [provider]  albuterol (ACCUNEB) 1.25 MG/3ML nebulizer solution albuterol sulfate 1.25 mg/3 mL solution for  nebulization  TAKE 3 MLS BY NEBULIZATION EVERY 6 HOURS AS NEEDED FOR WHEEZING 12/17/18   [provider]  amLODipine (NORVASC) 5 MG tablet Take 1 tablet (5 mg total) by mouth daily. 01/07/19   Clapacs, [name], MD  aspirin EC 81 MG tablet Take 1 tablet (81 mg total) by mouth daily. 01/07/19   Clapacs, [name], MD  citalopram (CELEXA) 20 MG tablet Take 1.5 tablets (30 mg total) by mouth daily. 01/15/19   Ursula Alert, MD  empagliflozin (JARDIANCE) 25 MG TABS tablet Take 25 mg by mouth daily. 01/07/19   Clapacs, [name], MD  fluticasone (FLONASE) 50 MCG/ACT nasal spray Place 2 sprays into both nostrils daily. 01/07/19   Clapacs, [name], MD  hydrochlorothiazide (HYDRODIURIL) 25 MG tablet Take 1 tablet (25 mg total) by mouth daily. 01/07/19   Clapacs, [name], MD  insulin aspart (NOVOLOG) 100 UNIT/ML injection Inject 1.15-1.25 Units into the skin See admin instructions. Via insulin pump-basal rate: 1.15-1.25 units/hr (every 8 carbs = 1 unit of insulin bolus) 0000-0800 = 1.15 units/hr and 0801/2359 = 1.25 units/hr 06/26/16   [provider]  levothyroxine (SYNTHROID) 75 MCG tablet Take 1 tablet (75 mcg total) by mouth daily at 6 (six) AM. 01/07/19   Clapacs, [name], MD  losartan (COZAAR) 100 MG tablet Take 1 tablet (100 mg total) by mouth daily. 01/07/19   Clapacs, [name], MD  pantoprazole (PROTONIX) 40 MG tablet Take 1 tablet (40 mg total) by mouth daily. 01/07/19   Clapacs, [name], MD  QUEtiapine (SEROQUEL) 200 MG tablet Take 1 tablet (200 mg total) by mouth at bedtime. 01/07/19   Clapacs, [name], MD  simvastatin (ZOCOR) 20 MG tablet Take 1 tablet (20 mg total) by mouth daily at 6 PM. 01/07/19   Clapacs, [name], MD  traMADol (ULTRAM) 50 MG tablet Take 2 tablets (100 mg total) by mouth every 6 (six) hours as needed for moderate pain or severe pain. 01/07/19   Clapacs, [name], MD  traZODone (DESYREL) 100 MG tablet Take 1 tablet (100 mg total) by mouth at bedtime. 01/07/19   Clapacs, [name], MD    Allergies Buspar  [buspirone], Depakote [valproic acid], Clopidogrel, and Gabapentin  Family History  Problem Relation Age of Onset  . Osteoporosis Mother   . Diabetes Mother   . Hypertension Father   . Mental illness Neg Hx     Social History Social History   Tobacco Use  . Smoking status: Former Smoker    Packs/day: 0.00    Types: Cigarettes    Quit date: 04/09/2013    Years since quitting: 5.7  . Smokeless tobacco: Never Used  Substance Use Topics  . Alcohol use: Yes    Alcohol/week: 2.0 standard drinks    Types: 2 Shots of liquor per week    Comment: rare  . Drug use: No    Comment: Pt denied; UDS not available    Review of Systems  Constitutional: No fever/chills Eyes: No visual changes. ENT: No sore throat. Cardiovascular: Denies chest pain. Respiratory: Denies shortness of breath. Gastrointestinal: No abdominal pain.  No nausea, no vomiting.  No  diarrhea.  No constipation. Genitourinary: Negative for dysuria. Musculoskeletal: Negative for back pain. Skin: Negative for rash. Neurological: Negative for headaches, focal weakness   ____________________________________________   PHYSICAL EXAM:  VITAL SIGNS: ED Triage Vitals [01/21/19 1443]  Enc Vitals Group     BP      Pulse      Resp      Temp      Temp src      SpO2      Weight 244 lb (110.7 kg)     Height 6' (1.829 m)     Head Circumference      Peak Flow      Pain Score 0     Pain Loc      Pain Edu?      Excl. in [place]?     Constitutional: Alert and oriented. Well appearing and in no acute distress. Eyes: Conjunctivae are normal.  Head: Atraumatic. Nose: No congestion/rhinnorhea. Mouth/Throat: Mucous membranes are moist.  Oropharynx non-erythematous.   Neck: No stridor.Tracheostomy in place Cardiovascular: Normal rate, regular rhythm. Grossly normal heart sounds.  Good peripheral circulation. Respiratory: Normal respiratory effort.  No retractions. Lungs CTAB. Gastrointestinal: Soft and nontender. No  distention. No abdominal bruits. No CVA tenderness. Musculoskeletal: No lower extremity tenderness nor edema.   Neurologic:  Normal speech and language. No gross focal neurologic deficits are appreciated. N. Skin:  Skin is warm, dry and intact. No rash noted.   ____________________________________________   LABS (all labs ordered are listed, but only abnormal results are displayed)  Labs Reviewed  COMPREHENSIVE METABOLIC PANEL - Abnormal; Notable for the following components:      Result Value   Sodium 134 (*)    CO2 20 (*)    Glucose, Bld 284 (*)    BUN 22 (*)    Creatinine, Ser 1.47 (*)    GFR calc non Af Amer 53 (*)    All other components within normal limits  ACETAMINOPHEN LEVEL - Abnormal; Notable for the following components:   Acetaminophen (Tylenol), Serum <10 (*)    All other components within normal limits  ETHANOL  SALICYLATE LEVEL  CBC  URINE DRUG SCREEN, QUALITATIVE ([facility] ONLY)  CBG MONITORING, ED  CBG MONITORING, ED   ____________________________________________  EKG   ____________________________________________  RADIOLOGY  ED MD interpretation:    Official radiology report(s): No results found.  ____________________________________________   PROCEDURES  Procedure(s) performed (including Critical Care):  Procedures   ____________________________________________   INITIAL IMPRESSION / ASSESSMENT AND PLAN / ED COURSE  Patient has not lost weight.  His vomiting may be due to diabetic gastroparesis.  He does not have any belly pain or organomegaly on exam.    [name] was evaluated in Emergency Department on 01/21/2019 for the symptoms described in the history of present illness. He was evaluated in the context of the global COVID-19 pandemic, which necessitated consideration that the patient might be at risk for infection with the SARS-CoV-2 virus that causes COVID-19. Institutional protocols and algorithms that pertain to the  evaluation of patients at risk for COVID-19 are in a state of rapid change based on information released by regulatory bodies including the CDC and federal and state organizations. These policies and algorithms were followed during the patient's care in the ED.          ____________________________________________   FINAL CLINICAL IMPRESSION(S) / ED DIAGNOSES  Final diagnoses:  Depression, unspecified depression type     ED Discharge Orders  None       Note:  This document was prepared using Dragon voice recognition software and may include unintentional dictation errors.    Nena Polio, MD 01/21/19 [phone]

## 2019-01-21 NOTE — Consult Note (Addendum)
[place] Psychiatry Consult   Reason for Consult:  Suicide attempt Referring Physician:  EDP Patient Identification: [name] MRN:  [id number] Principal Diagnosis: Major depressive disorder, recurrent severe without psychotic features ([place]) Diagnosis:  Active Problems:   Major depressive disorder, recurrent severe without psychotic features ([place])   Total Time spent with patient: 30 minutes  Subjective:   [name] is a 58 y.o. male admitted with depression and suicidal thoughts.   HPI:  Per Dr. Cinda Quest : [name] is a 58 y.o. male reports his medications do not seem to be working.  He will be fine for a while and he will get frustrated sometimes by the medications because of so many of them and he wanted kill himself again.  He is afraid he is going to kill himself 1 of these times when he gets very frustrated.  He was here recently with suicidal ideation also.  Patient reports if he eats too much he vomits.  Patient is seen by this provider face-to-face.  Patient presents today with depression and suicidal thoughts. He was being seen at an outpatient visit when he endorse suicidal thoughts. He was transported here for psych evaluation. Patient is assessed at this time, and is observed eating a meal tray. He appears to be eating well and denies any sleeping dsturbances. He endorses depressed mood , however his affect is incongruent. He endorses suicidal thoughts at this time. He was recently admitted to our facility about 3 weeks ago, and has been in the ED 2 times during the month of August.   Past Psychiatric History: depression, anxiety,   Risk to Self:   Risk to Others:   Prior Inpatient Therapy:   Prior Outpatient Therapy:    Past Medical History:  Past Medical History:  Diagnosis Date  . Depression   . Diabetes (HCC)    Insulin Pump  . Diabetes mellitus type I ([place])   . Diabetes mellitus without complication ([place])   . GERD (gastroesophageal  reflux disease)   . H/O laryngectomy   . Heel bone fracture   . Hyperlipidemia   . Hypertension   . Radicular pain of right lower extremity   . Stroke ([place])   . Suicide attempt [facility]) 2014   damaged larynx - tracheostomy  . Thyroid disease     Past Surgical History:  Procedure Laterality Date  . COLONOSCOPY WITH PROPOFOL N/A 05/15/2018   Procedure: COLONOSCOPY WITH PROPOFOL;  Surgeon: [name], [name], MD;  Location: [facility] ENDOSCOPY;  Service: Gastroenterology;  Laterality: N/A;  . ESOPHAGOGASTRODUODENOSCOPY N/A 05/15/2018   Procedure: ESOPHAGOGASTRODUODENOSCOPY (EGD);  Surgeon: [name], [name], MD;  Location: [facility] ENDOSCOPY;  Service: Gastroenterology;  Laterality: N/A;  . FRACTURE SURGERY    . Heel bone reconstruction Left   . HERNIA REPAIR  [id number]   Umbilical hernia repair   . LARYNGECTOMY    . NECK SURGERY     fusion  . SPINE SURGERY    . TRACHEOSTOMY  2014   from SI attempt   Family History:  Family History  Problem Relation Age of Onset  . Osteoporosis Mother   . Diabetes Mother   . Hypertension Father   . Mental illness Neg Hx    Family Psychiatric  History: None reported Social History:  Social History   Substance and Sexual Activity  Alcohol Use Yes  . Alcohol/week: 2.0 standard drinks  . Types: 2 Shots of liquor per week   Comment: rare     Social  History   Substance and Sexual Activity  Drug Use No   Comment: Pt denied; UDS not available    Social History   Socioeconomic History  . Marital status: Married    Spouse name: Not on file  . Number of children: Not on file  . Years of education: Not on file  . Highest education level: Not on file  Occupational History  . Not on file  Social Needs  . Financial resource strain: Not on file  . Food insecurity    Worry: Not on file    Inability: Not on file  . Transportation needs    Medical: Not on file    Non-medical: Not on file  Tobacco Use  . Smoking status: Former Smoker    Packs/day: 0.00     Types: Cigarettes    Quit date: 04/09/2013    Years since quitting: 5.7  . Smokeless tobacco: Never Used  Substance and Sexual Activity  . Alcohol use: Yes    Alcohol/week: 2.0 standard drinks    Types: 2 Shots of liquor per week    Comment: rare  . Drug use: No    Comment: Pt denied; UDS not available  . Sexual activity: Yes    Partners: Female    Birth control/protection: Condom  Lifestyle  . Physical activity    Days per week: Not on file    Minutes per session: Not on file  . Stress: Not on file  Relationships  . Social Herbalist on phone: Not on file    Gets together: Not on file    Attends religious service: Not on file    Active member of club or organization: Not on file    Attends meetings of clubs or organizations: Not on file    Relationship status: Not on file  Other Topics Concern  . Not on file  Social History Narrative  . Not on file   Additional Social History:    Allergies:   Allergies  Allergen Reactions  . Buspar [Buspirone]     Makes the patient "flip out"  . Depakote [Valproic Acid]     Causes excessive drowsiness  . Clopidogrel Rash  . Gabapentin Itching and Rash    Labs:  Results for orders placed or performed during the hospital encounter of 01/21/19 (from the past 48 hour(s))  Comprehensive metabolic panel     Status: Abnormal   Collection Time: 01/21/19  2:57 PM  Result Value Ref Range   Sodium 134 (L) 135 - 145 mmol/L   Potassium 3.5 3.5 - 5.1 mmol/L   Chloride 100 98 - 111 mmol/L   CO2 20 (L) 22 - 32 mmol/L   Glucose, Bld 284 (H) 70 - 99 mg/dL   BUN 22 (H) 6 - 20 mg/dL   Creatinine, Ser 1.47 (H) 0.61 - 1.24 mg/dL   Calcium 9.3 8.9 - 10.3 mg/dL   Total Protein 6.9 6.5 - 8.1 g/dL   Albumin 3.7 3.5 - 5.0 g/dL   AST 24 15 - 41 U/L   ALT 16 0 - 44 U/L   Alkaline Phosphatase 116 38 - 126 U/L   Total Bilirubin 0.8 0.3 - 1.2 mg/dL   GFR calc non Af Amer 53 (L) >60 mL/min   GFR calc Af Amer >60 >60 mL/min   Anion gap  14 5 - 15    Comment: Performed at [facility], [address] [place], [place] [address]  Ethanol  Status: None   Collection Time: 01/21/19  2:57 PM  Result Value Ref Range   Alcohol, Ethyl (B) <10 <10 mg/dL    Comment: (NOTE) Lowest detectable limit for serum alcohol is 10 mg/dL. For medical purposes only. Performed at [facility], [address] [place], [place] [id number]   Salicylate level     Status: None   Collection Time: 01/21/19  2:57 PM  Result Value Ref Range   Salicylate Lvl [id number] 2.8 - 30.0 mg/dL    Comment: Performed at [facility], [address] [place], [place] [address]  Acetaminophen level     Status: Abnormal   Collection Time: 01/21/19  2:57 PM  Result Value Ref Range   Acetaminophen (Tylenol), Serum <10 (L) 10 - 30 ug/mL    Comment: (NOTE) Therapeutic concentrations vary significantly. A range of 10-30 ug/mL  may be an effective concentration for many patients. However, some  are best treated at concentrations outside of this range. Acetaminophen concentrations >150 ug/mL at 4 hours after ingestion  and >50 ug/mL at 12 hours after ingestion are often associated with  toxic reactions. Performed at [facility], [address] [place], [place] [address]   cbc     Status: None   Collection Time: 01/21/19  2:57 PM  Result Value Ref Range   WBC 9.0 4.0 - 10.5 K/uL   RBC 5.42 4.22 - 5.81 MIL/uL   Hemoglobin 15.9 13.0 - 17.0 g/dL   HCT 47.3 39.0 - 52.0 %   MCV 87.3 80.0 - 100.0 fL   MCH 29.3 26.0 - 34.0 pg   MCHC 33.6 30.0 - 36.0 g/dL   RDW 13.8 11.5 - 15.5 %   Platelets 291 150 - 400 K/uL   nRBC 0.0 0.0 - 0.2 %    Comment: Performed at [facility], [address], [place], [place] [address]  Urine Drug Screen, Qualitative     Status: Abnormal   Collection Time: 01/21/19  3:00 PM  Result Value Ref Range   Tricyclic, Ur Screen NONE DETECTED NONE DETECTED   Amphetamines, Ur Screen NONE DETECTED  NONE DETECTED   MDMA (Ecstasy)Ur Screen NONE DETECTED NONE DETECTED   Cocaine Metabolite,Ur [place] POSITIVE (A) NONE DETECTED   Opiate, Ur Screen NONE DETECTED NONE DETECTED   Phencyclidine (PCP) Ur S NONE DETECTED NONE DETECTED   Cannabinoid 50 Ng, Ur [place] NONE DETECTED NONE DETECTED   Barbiturates, Ur Screen NONE DETECTED NONE DETECTED   Benzodiazepine, Ur Scrn NONE DETECTED NONE DETECTED   Methadone Scn, Ur NONE DETECTED NONE DETECTED    Comment: (NOTE) Tricyclics + metabolites, urine    Cutoff 1000 ng/mL Amphetamines + metabolites, urine  Cutoff 1000 ng/mL MDMA (Ecstasy), urine              Cutoff 500 ng/mL Cocaine Metabolite, urine          Cutoff 300 ng/mL Opiate + metabolites, urine        Cutoff 300 ng/mL Phencyclidine (PCP), urine         Cutoff 25 ng/mL Cannabinoid, urine                 Cutoff 50 ng/mL Barbiturates + metabolites, urine  Cutoff 200 ng/mL Benzodiazepine, urine              Cutoff 200 ng/mL Methadone, urine                   Cutoff 300 ng/mL The urine drug  screen provides only a preliminary, unconfirmed analytical test result and should not be used for non-medical purposes. Clinical consideration and professional judgment should be applied to any positive drug screen result due to possible interfering substances. A more specific alternate chemical method must be used in order to obtain a confirmed analytical result. Gas chromatography / mass spectrometry (GC/MS) is the preferred confirmat ory method. Performed at [facility], [address], [place], [place] [address]     Current Facility-Administered Medications  Medication Dose Route Frequency Provider Last Rate Last Dose  . [START ON 01/22/2019] influenza vac split quadrivalent PF (FLUARIX) injection 0.5 mL  0.5 mL Intramuscular Tomorrow-1000 Earleen Newport, MD       Current Outpatient Medications  Medication Sig Dispense Refill  . acetylcysteine (MUCOMYST) 20 % nebulizer solution  acetylcysteine 200 mg/mL (20 %) solution  INHALE 4 MLS INTO THE LUNGS EVERY 6 HOURS    . albuterol (ACCUNEB) 1.25 MG/3ML nebulizer solution albuterol sulfate 1.25 mg/3 mL solution for nebulization  TAKE 3 MLS BY NEBULIZATION EVERY 6 HOURS AS NEEDED FOR WHEEZING    . amLODipine (NORVASC) 5 MG tablet Take 1 tablet (5 mg total) by mouth daily. 30 tablet 1  . aspirin EC 81 MG tablet Take 1 tablet (81 mg total) by mouth daily. 30 tablet 1  . citalopram (CELEXA) 20 MG tablet Take 1.5 tablets (30 mg total) by mouth daily. 45 tablet 1  . empagliflozin (JARDIANCE) 25 MG TABS tablet Take 25 mg by mouth daily. 30 tablet 1  . fluticasone (FLONASE) 50 MCG/ACT nasal spray Place 2 sprays into both nostrils daily. 16 g 2  . hydrochlorothiazide (HYDRODIURIL) 25 MG tablet Take 1 tablet (25 mg total) by mouth daily. 30 tablet 1  . insulin aspart (NOVOLOG) 100 UNIT/ML injection Inject 1.15-1.25 Units into the skin See admin instructions. Via insulin pump-basal rate: 1.15-1.25 units/hr (every 8 carbs = 1 unit of insulin bolus) 0000-0800 = 1.15 units/hr and 0801/2359 = 1.25 units/hr    . levothyroxine (SYNTHROID) 75 MCG tablet Take 1 tablet (75 mcg total) by mouth daily at 6 (six) AM. 30 tablet 1  . losartan (COZAAR) 100 MG tablet Take 1 tablet (100 mg total) by mouth daily. 30 tablet 1  . pantoprazole (PROTONIX) 40 MG tablet Take 1 tablet (40 mg total) by mouth daily. 30 tablet 1  . QUEtiapine (SEROQUEL) 200 MG tablet Take 1 tablet (200 mg total) by mouth at bedtime. 30 tablet 1  . simvastatin (ZOCOR) 20 MG tablet Take 1 tablet (20 mg total) by mouth daily at 6 PM. 30 tablet 1  . traMADol (ULTRAM) 50 MG tablet Take 2 tablets (100 mg total) by mouth every 6 (six) hours as needed for moderate pain or severe pain. 60 tablet 1  . traZODone (DESYREL) 100 MG tablet Take 1 tablet (100 mg total) by mouth at bedtime. 30 tablet 1    Musculoskeletal: Strength & Muscle Tone: within normal limits Gait & Station: normal Patient  leans: N/A  Psychiatric Specialty Exam: Physical Exam  Nursing note and vitals reviewed. Constitutional: He is oriented to person, place, and time. He appears well-developed and well-nourished.  Cardiovascular: Normal rate.  Respiratory: Effort normal.  Musculoskeletal: Normal range of motion.  Neurological: He is alert and oriented to person, place, and time.    Review of Systems  Constitutional: Negative.   HENT: Negative.   Eyes: Negative.   Respiratory: Negative.   Cardiovascular: Negative.   Gastrointestinal: Negative.   Genitourinary: Negative.  Musculoskeletal: Negative.   Skin: Negative.   Neurological: Negative.   Endo/Heme/Allergies: Negative.   Psychiatric/Behavioral: Positive for depression and suicidal ideas. The patient is nervous/anxious.     Height 6' (1.829 m), weight 110.7 kg.Body mass index is 33.09 kg/m.  General Appearance: Casual  Eye Contact:  Fair  Speech:  Clear and Coherent and Normal Rate  Volume:  Normal  Mood:  Depressed  Affect:  Non-Congruent  Thought Process:  Coherent, Linear and Descriptions of Associations: Intact  Orientation:  Full (Time, Place, and Person)  Thought Content:  WDL  Suicidal Thoughts:  No  Homicidal Thoughts:  denies  Memory:  Immediate;   Good Recent;   Good Remote;   Good  Judgement:  Fair  Insight:  Fair  Psychomotor Activity:  Normal  Concentration:  Concentration: Good  Recall:  Good  Fund of Knowledge:  Good  Language:  Good  Akathisia:  No  Handed:  Right  AIMS (if indicated):     Assets:  Communication Skills Desire for Improvement Financial Resources/Insurance Housing Resilience Social Support Transportation  ADL's:  Intact  Cognition:  WNL  Sleep:        Treatment Plan Summary: Daily contact with patient to assess and evaluate symptoms and progress in treatment and Medication management  Restart home medications  Disposition: will reevaluate tomorrow.   Suella Broad,  FNP 01/21/2019 5:07 PM   Case discussed and plan agreed upon as per above.

## 2019-01-21 NOTE — ED Notes (Signed)
Pt was given dinner tray.  

## 2019-01-21 NOTE — ED Triage Notes (Signed)
Pt here with c/o depression, thinks he wants to kill himself, has not acted on it "this time," denies a plan. "Feel like my body and mind are going all over the place-I need something to help me calm down." Feels like he is on the wrong medication regimen. Wife in with pt in triage.

## 2019-01-21 NOTE — ED Notes (Signed)
Patient is c/o right ear pain. He request for EDP to examine him. EDP notified.

## 2019-01-21 NOTE — BH Assessment (Signed)
Assessment Note  [name] is an 58 y.o. male who presents to the ER due thoughts of self-harm. Patient states the thoughts have increase for the last several days. He stated he was going to shot his self in the head but patient does not have access or means to a gun. He also reports, he was recently inpatient with [facility] and he states in the admission wasn't helpful.  During the interview, the patient was calm, cooperative and pleasant. He was able to provide appropriate answers to the questions. He denies involvement with the legal system. He also denies the use of mind-altering substance. However, his UDS was positive for cocaine.  Patient denies SI/HI and AV/H.  Diagnosis: Depression  Past Medical History:  Past Medical History:  Diagnosis Date  . Depression   . Diabetes (HCC)    Insulin Pump  . Diabetes mellitus type I ([place])   . Diabetes mellitus without complication ([place])   . GERD (gastroesophageal reflux disease)   . H/O laryngectomy   . Heel bone fracture   . Hyperlipidemia   . Hypertension   . Radicular pain of right lower extremity   . Stroke ([place])   . Suicide attempt [facility]) 2014   damaged larynx - tracheostomy  . Thyroid disease     Past Surgical History:  Procedure Laterality Date  . COLONOSCOPY WITH PROPOFOL N/A 05/15/2018   Procedure: COLONOSCOPY WITH PROPOFOL;  Surgeon: [name], [name], MD;  Location: [facility] ENDOSCOPY;  Service: Gastroenterology;  Laterality: N/A;  . ESOPHAGOGASTRODUODENOSCOPY N/A 05/15/2018   Procedure: ESOPHAGOGASTRODUODENOSCOPY (EGD);  Surgeon: [name], [name], MD;  Location: [facility] ENDOSCOPY;  Service: Gastroenterology;  Laterality: N/A;  . FRACTURE SURGERY    . Heel bone reconstruction Left   . HERNIA REPAIR  [id number]   Umbilical hernia repair   . LARYNGECTOMY    . NECK SURGERY     fusion  . SPINE SURGERY    . TRACHEOSTOMY  2014   from SI attempt    Family History:  Family History  Problem Relation Age of Onset  .  Osteoporosis Mother   . Diabetes Mother   . Hypertension Father   . Mental illness Neg Hx     Social History:  reports that he quit smoking about 5 years ago. His smoking use included cigarettes. He smoked 0.00 packs per day. He has never used smokeless tobacco. He reports current alcohol use of about 2.0 standard drinks of alcohol per week. He reports that he does not use drugs.  Additional Social History:  Alcohol / Drug Use Pain Medications: See PTA Prescriptions: See PTA Over the Counter: See PTA History of alcohol / drug use?: Yes Longest period of sobriety (when/how long): Patient denies use of mind-altering substances Substance #1 Name of Substance 1: Cocaine  CIWA:   COWS:    Allergies:  Allergies  Allergen Reactions  . Buspar [Buspirone]     Makes the patient "flip out"  . Depakote [Valproic Acid]     Causes excessive drowsiness  . Clopidogrel Rash  . Gabapentin Itching and Rash    Home Medications: (Not in a hospital admission)   OB/GYN Status:  No LMP for male patient.  General Assessment Data Location of Assessment: [facility] ED TTS Assessment: In system Is this a Tele or Face-to-Face Assessment?: Face-to-Face Is this an Initial Assessment or a Re-assessment for this encounter?: Initial Assessment Patient Accompanied by:: N/A Language Other than English: No Living Arrangements: Other (Comment)(Private Home) What gender do you  identify as?: Male Marital status: Married Pregnancy Status: No Living Arrangements: Spouse/significant other Can pt return to current living arrangement?: Yes Admission Status: Involuntary Petitioner: ED Attending Is patient capable of signing voluntary admission?: No(Under IVC) Referral Source: Self/Family/Friend Insurance type: [employer]  Medical Screening Exam ([place]) Medical Exam completed: Yes  Crisis Care Plan Living Arrangements: Spouse/significant other Legal Guardian: Other:(Self) Name of  Psychiatrist: Dr. Ria Clock Eappen([facility]) Name of Therapist: Reports of none  Education Status Is patient currently in school?: No Highest grade of school patient has completed: college certification Is the patient employed, unemployed or receiving disability?: Unemployed, Receiving disability income  Risk to self with the past 6 months Suicidal Ideation: Yes-Currently Present Has patient been a risk to self within the past 6 months prior to admission? : Yes Suicidal Intent: No-Not Currently/Within Last 6 Months Has patient had any suicidal intent within the past 6 months prior to admission? : Yes Is patient at risk for suicide?: No Suicidal Plan?: Yes-Currently Present Has patient had any suicidal plan within the past 6 months prior to admission? : Yes Specify Current Suicidal Plan: Shot yourself Access to Means: No What has been your use of drugs/alcohol within the last 12 months?: Positive for cocaine Previous Attempts/Gestures: Yes How many times?: 1 Other Self Harm Risks: Drug use Triggers for Past Attempts: Spouse contact Intentional Self Injurious Behavior: None Family Suicide History: Unknown Recent stressful life event(s): Other (Comment), Conflict (Comment), Loss (Comment) Persecutory voices/beliefs?: No Depression: Yes Depression Symptoms: Insomnia, Tearfulness, Isolating, Fatigue, Guilt, Loss of interest in usual pleasures, Feeling worthless/self pity Substance abuse history and/or treatment for substance abuse?: Yes Suicide prevention information given to non-admitted patients: Not applicable  Risk to Others within the past 6 months Homicidal Ideation: No Does patient have any lifetime risk of violence toward others beyond the six months prior to admission? : No Thoughts of Harm to Others: No Current Homicidal Intent: No Current Homicidal Plan: No Access to Homicidal Means: No Identified Victim: Reports of none History of harm to others?:  No Assessment of Violence: None Noted Violent Behavior Description: Reports of none Does patient have access to weapons?: No Criminal Charges Pending?: No Does patient have a court date: No Is patient on probation?: No  Psychosis Hallucinations: None noted Delusions: None noted  Mental Status Report Appearance/Hygiene: Unremarkable, In scrubs Eye Contact: Fair Motor Activity: Freedom of movement, Unremarkable Speech: Logical/coherent, Unremarkable Level of Consciousness: Alert Mood: Anxious, Sad, Pleasant Affect: Depressed, Anxious Anxiety Level: Minimal Thought Processes: Coherent, Relevant Judgement: Unimpaired Orientation: Person, Place, Time, Situation, Appropriate for developmental age Obsessive Compulsive Thoughts/Behaviors: None  Cognitive Functioning Concentration: Normal Memory: Recent Intact, Remote Intact Is patient IDD: No Insight: Fair Impulse Control: Fair Appetite: Good Have you had any weight changes? : No Change Sleep: No Change Total Hours of Sleep: 8 Vegetative Symptoms: None  ADLScreening [facility] Assessment Services) Patient's cognitive ability adequate to safely complete daily activities?: Yes Patient able to express need for assistance with ADLs?: Yes Independently performs ADLs?: Yes (appropriate for developmental age)  Prior Inpatient Therapy Prior Inpatient Therapy: Yes Prior Therapy Dates: 12/2018 & 09/2016 Prior Therapy Facilty/Provider(s): [facility] Reason for Treatment: Depression   Prior Outpatient Therapy Prior Outpatient Therapy: Yes Prior Therapy Dates: Current Prior Therapy Facilty/Provider(s): [facility] Reason for Treatment: Depression Does patient have an ACCT team?: No Does patient have Intensive In-House Services?  : No Does patient have [facility] services? : No Does patient have P4CC services?: No  ADL  Screening (condition at time of admission) Patient's cognitive ability adequate to safely complete  daily activities?: Yes Is the patient deaf or have difficulty hearing?: No Does the patient have difficulty seeing, even when wearing glasses/contacts?: No Does the patient have difficulty concentrating, remembering, or making decisions?: No Patient able to express need for assistance with ADLs?: Yes Does the patient have difficulty dressing or bathing?: No Independently performs ADLs?: Yes (appropriate for developmental age) Does the patient have difficulty walking or climbing stairs?: No Weakness of Legs: None Weakness of Arms/Hands: None  Home Assistive Devices/Equipment Home Assistive Devices/Equipment: None  Therapy Consults (therapy consults require a physician order) PT Evaluation Needed: No OT Evalulation Needed: No SLP Evaluation Needed: No Abuse/Neglect Assessment (Assessment to be complete while patient is alone) Abuse/Neglect Assessment Can Be Completed: Yes Physical Abuse: Denies Verbal Abuse: Denies Sexual Abuse: Denies Exploitation of patient/patient's resources: Denies Self-Neglect: Denies Values / Beliefs Cultural Requests During Hospitalization: None Spiritual Requests During Hospitalization: None Consults Spiritual Care Consult Needed: No Social Work Consult Needed: No Regulatory affairs officer (For Healthcare) Does Patient Have a Medical Advance Directive?: No       Child/Adolescent Assessment Running Away Risk: Denies(Patient is an adult)  Disposition:  Disposition Initial Assessment Completed for this Encounter: Yes  On Site Evaluation by:   Reviewed with Physician:    Gunnar Fusi MS, LCAS, [facility], [place] Therapeutic Triage Specialist 01/21/2019 6:14 PM

## 2019-01-21 NOTE — ED Notes (Signed)
Pt given diet coke. 

## 2019-01-22 ENCOUNTER — Inpatient Hospital Stay
Admission: RE | Admit: 2019-01-22 | Discharge: 2019-01-29 | DRG: 885 | Disposition: A | Discharge status: Home / Self Care | Payer: PPO | Source: Intra-hospital | Attending: Psychiatry | Admitting: Psychiatry

## 2019-01-22 ENCOUNTER — Other Ambulatory Visit: Payer: Self-pay

## 2019-01-22 DIAGNOSIS — Z794 Long term (current) use of insulin: Secondary | ICD-10-CM | POA: Diagnosis not present

## 2019-01-22 DIAGNOSIS — Z915 Personal history of self-harm: Secondary | ICD-10-CM | POA: Diagnosis not present

## 2019-01-22 DIAGNOSIS — Z87891 Personal history of nicotine dependence: Secondary | ICD-10-CM | POA: Diagnosis not present

## 2019-01-22 DIAGNOSIS — Z8673 Personal history of transient ischemic attack (TIA), and cerebral infarction without residual deficits: Secondary | ICD-10-CM

## 2019-01-22 DIAGNOSIS — E039 Hypothyroidism, unspecified: Secondary | ICD-10-CM | POA: Diagnosis not present

## 2019-01-22 DIAGNOSIS — N179 Acute kidney failure, unspecified: Secondary | ICD-10-CM | POA: Diagnosis not present

## 2019-01-22 DIAGNOSIS — Z930 Tracheostomy status: Secondary | ICD-10-CM

## 2019-01-22 DIAGNOSIS — E1065 Type 1 diabetes mellitus with hyperglycemia: Secondary | ICD-10-CM | POA: Diagnosis present

## 2019-01-22 DIAGNOSIS — F429 Obsessive-compulsive disorder, unspecified: Secondary | ICD-10-CM

## 2019-01-22 DIAGNOSIS — E785 Hyperlipidemia, unspecified: Secondary | ICD-10-CM | POA: Diagnosis not present

## 2019-01-22 DIAGNOSIS — I10 Essential (primary) hypertension: Secondary | ICD-10-CM | POA: Diagnosis not present

## 2019-01-22 DIAGNOSIS — R45851 Suicidal ideations: Secondary | ICD-10-CM | POA: Diagnosis not present

## 2019-01-22 DIAGNOSIS — F332 Major depressive disorder, recurrent severe without psychotic features: Secondary | ICD-10-CM | POA: Diagnosis not present

## 2019-01-22 DIAGNOSIS — K219 Gastro-esophageal reflux disease without esophagitis: Secondary | ICD-10-CM | POA: Diagnosis not present

## 2019-01-22 DIAGNOSIS — E119 Type 2 diabetes mellitus without complications: Secondary | ICD-10-CM | POA: Diagnosis not present

## 2019-01-22 DIAGNOSIS — G4700 Insomnia, unspecified: Secondary | ICD-10-CM | POA: Diagnosis not present

## 2019-01-22 DIAGNOSIS — E109 Type 1 diabetes mellitus without complications: Secondary | ICD-10-CM | POA: Diagnosis not present

## 2019-01-22 DIAGNOSIS — F418 Other specified anxiety disorders: Secondary | ICD-10-CM | POA: Diagnosis not present

## 2019-01-22 LAB — GLUCOSE, CAPILLARY
Glucose-Capillary: 481 mg/dL — ABNORMAL HIGH (ref 70–99)
Glucose-Capillary: 462 mg/dL — ABNORMAL HIGH (ref 70–99)
Glucose-Capillary: 489 mg/dL — ABNORMAL HIGH (ref 70–99)
Glucose-Capillary: 403 mg/dL — ABNORMAL HIGH (ref 70–99)
Glucose-Capillary: 222 mg/dL — ABNORMAL HIGH (ref 70–99)
Glucose-Capillary: 161 mg/dL — ABNORMAL HIGH (ref 70–99)
Glucose-Capillary: 221 mg/dL — ABNORMAL HIGH (ref 70–99)
Glucose-Capillary: 177 mg/dL — ABNORMAL HIGH (ref 70–99)

## 2019-01-22 LAB — BASIC METABOLIC PANEL
Anion gap: 15 (ref 5–15)
BUN: 28 mg/dL — ABNORMAL HIGH (ref 6–20)
CO2: 20 mmol/L — ABNORMAL LOW (ref 22–32)
Calcium: 8.8 mg/dL — ABNORMAL LOW (ref 8.9–10.3)
Chloride: 100 mmol/L (ref 98–111)
Creatinine, Ser: 1.8 mg/dL — ABNORMAL HIGH (ref 0.61–1.24)
GFR calc Af Amer: 48 mL/min — ABNORMAL LOW (ref >60)
GFR calc non Af Amer: 41 mL/min — ABNORMAL LOW (ref >60)
Glucose, Bld: 510 mg/dL (ref 70–99)
Potassium: 3.9 mmol/L (ref 3.5–5.1)
Sodium: 135 mmol/L (ref 135–145)

## 2019-01-22 LAB — SARS CORONAVIRUS 2 BY RT PCR (HOSPITAL ORDER, PERFORMED IN ~~LOC~~ HOSPITAL LAB): SARS Coronavirus 2: NEGATIVE

## 2019-01-22 MED ORDER — ALUM & MAG HYDROXIDE-SIMETH 200-200-20 MG/5ML PO SUSP
30.0000 mL | ORAL | Status: DC | PRN
  Administered 2019-01-24 – 2019-01-26 (×2): 30 mL via ORAL
  Filled 2019-01-22 (×2): qty 30

## 2019-01-22 MED ORDER — INSULIN GLARGINE 100 UNIT/ML ~~LOC~~ SOLN
30.0000 [IU] | Freq: Every day | SUBCUTANEOUS | Status: DC
  Administered 2019-01-22: 30 [IU] via SUBCUTANEOUS
  Filled 2019-01-22 (×2): qty 0.3

## 2019-01-22 MED ORDER — MAGNESIUM HYDROXIDE 400 MG/5ML PO SUSP: 30.0000 mL | Freq: Every day | ORAL | Status: DC | PRN

## 2019-01-22 MED ORDER — ASPIRIN EC 81 MG PO TBEC
81.0000 mg | DELAYED_RELEASE_TABLET | Freq: Every day | ORAL | Status: DC
  Administered 2019-01-23 – 2019-01-29 (×7): 81 mg via ORAL
  Filled 2019-01-22 (×8): qty 1

## 2019-01-22 MED ORDER — AMLODIPINE BESYLATE 5 MG PO TABS
5.0000 mg | ORAL_TABLET | Freq: Every day | ORAL | Status: DC
  Administered 2019-01-23 – 2019-01-29 (×7): 5 mg via ORAL
  Filled 2019-01-22 (×7): qty 1

## 2019-01-22 MED ORDER — INSULIN ASPART 100 UNIT/ML ~~LOC~~ SOLN
0.0000 [IU] | Freq: Every day | SUBCUTANEOUS | Status: DC
  Administered 2019-01-27 – 2019-01-28 (×2): 2 [IU] via SUBCUTANEOUS
  Filled 2019-01-22 (×2): qty 1

## 2019-01-22 MED ORDER — INSULIN ASPART 100 UNIT/ML ~~LOC~~ SOLN
10.0000 [IU] | Freq: Once | SUBCUTANEOUS | Status: AC
  Administered 2019-01-22: 15:00:00 10 [IU] via INTRAVENOUS
  Filled 2019-01-22: qty 1

## 2019-01-22 MED ORDER — INSULIN ASPART 100 UNIT/ML ~~LOC~~ SOLN
10.0000 [IU] | Freq: Three times a day (TID) | SUBCUTANEOUS | Status: DC
  Administered 2019-01-22 (×2): 10 [IU] via SUBCUTANEOUS
  Filled 2019-01-22: qty 1

## 2019-01-22 MED ORDER — EMPAGLIFLOZIN 25 MG PO TABS: 25.0000 mg | ORAL_TABLET | Freq: Every day | ORAL | Status: DC

## 2019-01-22 MED ORDER — SODIUM CHLORIDE 0.9 % IV BOLUS
1000.0000 mL | Freq: Once | INTRAVENOUS | Status: AC
  Administered 2019-01-22: 1000 mL via INTRAVENOUS

## 2019-01-22 MED ORDER — TRAZODONE HCL 100 MG PO TABS
100.0000 mg | ORAL_TABLET | Freq: Every evening | ORAL | Status: DC | PRN
  Administered 2019-01-22 – 2019-01-28 (×7): 100 mg via ORAL
  Filled 2019-01-22 (×7): qty 1

## 2019-01-22 MED ORDER — HYDROXYZINE HCL 25 MG PO TABS
25.0000 mg | ORAL_TABLET | Freq: Three times a day (TID) | ORAL | Status: DC | PRN
  Administered 2019-01-22 – 2019-01-28 (×8): 25 mg via ORAL
  Filled 2019-01-22 (×9): qty 1

## 2019-01-22 MED ORDER — ALBUTEROL SULFATE (2.5 MG/3ML) 0.083% IN NEBU: 2.5000 mg | INHALATION_SOLUTION | Freq: Three times a day (TID) | RESPIRATORY_TRACT | Status: DC | PRN

## 2019-01-22 MED ORDER — QUETIAPINE FUMARATE 200 MG PO TABS
200.0000 mg | ORAL_TABLET | Freq: Once | ORAL | Status: AC
  Administered 2019-01-22: 23:00:00 200 mg via ORAL
  Filled 2019-01-22: qty 1

## 2019-01-22 MED ORDER — ACETAMINOPHEN 325 MG PO TABS
650.0000 mg | ORAL_TABLET | Freq: Four times a day (QID) | ORAL | Status: DC | PRN
  Administered 2019-01-27 – 2019-01-29 (×5): 650 mg via ORAL
  Filled 2019-01-22 (×6): qty 2

## 2019-01-22 MED ORDER — INSULIN ASPART 100 UNIT/ML ~~LOC~~ SOLN
0.0000 [IU] | Freq: Three times a day (TID) | SUBCUTANEOUS | Status: DC
  Administered 2019-01-22: 13:00:00 15 [IU] via SUBCUTANEOUS
  Administered 2019-01-22: 3 [IU] via SUBCUTANEOUS
  Filled 2019-01-22 (×2): qty 1

## 2019-01-22 NOTE — ED Notes (Signed)
ED TO INPATIENT HANDOFF REPORT  ED Nurse Name and Phone #: Joelene Millin [phone]  S Name/Age/Gender [name] 58 y.o. male Room/Bed: ED22A/ED22AA  Code Status   Code Status: Prior  Home/SNF/Other Home Patient oriented to: self, place, time and situation Is this baseline? Yes   Triage Complete: Triage complete  Chief Complaint beh med eval  Triage Note Pt here with c/o depression, thinks he wants to kill himself, has not acted on it "this time," denies a plan. "Feel like my body and mind are going all over the place-I need something to help me calm down." Feels like he is on the wrong medication regimen. Wife in with pt in triage.    Allergies Allergies  Allergen Reactions  . Buspar [Buspirone]     Makes the patient "flip out"  . Depakote [Valproic Acid]     Causes excessive drowsiness  . Clopidogrel Rash  . Gabapentin Itching and Rash    Level of Care/Admitting Diagnosis ED Disposition    ED Disposition Condition Comment   Admit to [facility]  [facility]. Accepting physician Dr. Weber Cooks      B Medical/Surgery History Past Medical History:  Diagnosis Date  . Depression   . Diabetes (HCC)    Insulin Pump  . Diabetes mellitus type I ([place])   . Diabetes mellitus without complication ([place])   . GERD (gastroesophageal reflux disease)   . H/O laryngectomy   . Heel bone fracture   . Hyperlipidemia   . Hypertension   . Radicular pain of right lower extremity   . Stroke ([place])   . Suicide attempt [facility]) 2014   damaged larynx - tracheostomy  . Thyroid disease    Past Surgical History:  Procedure Laterality Date  . COLONOSCOPY WITH PROPOFOL N/A 05/15/2018   Procedure: COLONOSCOPY WITH PROPOFOL;  Surgeon: [name], [name], MD;  Location: [facility] ENDOSCOPY;  Service: Gastroenterology;  Laterality: N/A;  . ESOPHAGOGASTRODUODENOSCOPY N/A 05/15/2018   Procedure: ESOPHAGOGASTRODUODENOSCOPY (EGD);  Surgeon: [name], [name], MD;  Location: [facility] ENDOSCOPY;  Service:  Gastroenterology;  Laterality: N/A;  . FRACTURE SURGERY    . Heel bone reconstruction Left   . HERNIA REPAIR  [id number]   Umbilical hernia repair   . LARYNGECTOMY    . NECK SURGERY     fusion  . SPINE SURGERY    . TRACHEOSTOMY  2014   from [place] attempt     A IV Location/Drains/Wounds Patient Lines/Drains/Airways Status   Active Line/Drains/Airways    Name:   Placement date:   Placement time:   Site:   Days:   Peripheral IV 01/22/19 Right Wrist   01/22/19    1434    Wrist   less than 1          Intake/Output Last 24 hours  Intake/Output Summary (Last 24 hours) at 01/22/2019 2004 Last data filed at 01/22/2019 1534 Gross per 24 hour  Intake 1000 ml  Output -  Net 1000 ml    Labs/Imaging Results for orders placed or performed during the hospital encounter of 01/21/19 (from the past 48 hour(s))  Comprehensive metabolic panel     Status: Abnormal   Collection Time: 01/21/19  2:57 PM  Result Value Ref Range   Sodium 134 (L) 135 - 145 mmol/L   Potassium 3.5 3.5 - 5.1 mmol/L   Chloride 100 98 - 111 mmol/L   CO2 20 (L) 22 - 32 mmol/L   Glucose, Bld 284 (H) 70 - 99 mg/dL  BUN 22 (H) 6 - 20 mg/dL   Creatinine, Ser 1.47 (H) 0.61 - 1.24 mg/dL   Calcium 9.3 8.9 - 10.3 mg/dL   Total Protein 6.9 6.5 - 8.1 g/dL   Albumin 3.7 3.5 - 5.0 g/dL   AST 24 15 - 41 U/L   ALT 16 0 - 44 U/L   Alkaline Phosphatase 116 38 - 126 U/L   Total Bilirubin 0.8 0.3 - 1.2 mg/dL   GFR calc non Af Amer 53 (L) >60 mL/min   GFR calc Af Amer >60 >60 mL/min   Anion gap 14 5 - 15    Comment: Performed at [facility], [address], [place], [place] [address]  Ethanol     Status: None   Collection Time: 01/21/19  2:57 PM  Result Value Ref Range   Alcohol, Ethyl (B) <10 <10 mg/dL    Comment: (NOTE) Lowest detectable limit for serum alcohol is 10 mg/dL. For medical purposes only. Performed at [facility], [address] [place], [place] [id number]   Salicylate level     Status:  None   Collection Time: 01/21/19  2:57 PM  Result Value Ref Range   Salicylate Lvl [id number] 2.8 - 30.0 mg/dL    Comment: Performed at [facility], [address] [place], [place] [address]  Acetaminophen level     Status: Abnormal   Collection Time: 01/21/19  2:57 PM  Result Value Ref Range   Acetaminophen (Tylenol), Serum <10 (L) 10 - 30 ug/mL    Comment: (NOTE) Therapeutic concentrations vary significantly. A range of 10-30 ug/mL  may be an effective concentration for many patients. However, some  are best treated at concentrations outside of this range. Acetaminophen concentrations >150 ug/mL at 4 hours after ingestion  and >50 ug/mL at 12 hours after ingestion are often associated with  toxic reactions. Performed at [facility], [address] [place], [place] [address]   cbc     Status: None   Collection Time: 01/21/19  2:57 PM  Result Value Ref Range   WBC 9.0 4.0 - 10.5 K/uL   RBC 5.42 4.22 - 5.81 MIL/uL   Hemoglobin 15.9 13.0 - 17.0 g/dL   HCT 47.3 39.0 - 52.0 %   MCV 87.3 80.0 - 100.0 fL   MCH 29.3 26.0 - 34.0 pg   MCHC 33.6 30.0 - 36.0 g/dL   RDW 13.8 11.5 - 15.5 %   Platelets 291 150 - 400 K/uL   nRBC 0.0 0.0 - 0.2 %    Comment: Performed at [facility], [address] [place], [place] [address]  Urine Drug Screen, Qualitative     Status: Abnormal   Collection Time: 01/21/19  3:00 PM  Result Value Ref Range   Tricyclic, Ur Screen NONE DETECTED NONE DETECTED   Amphetamines, Ur Screen NONE DETECTED NONE DETECTED   MDMA (Ecstasy)Ur Screen NONE DETECTED NONE DETECTED   Cocaine Metabolite,Ur [place] POSITIVE (A) NONE DETECTED   Opiate, Ur Screen NONE DETECTED NONE DETECTED   Phencyclidine (PCP) Ur S NONE DETECTED NONE DETECTED   Cannabinoid 50 Ng, Ur [place] NONE DETECTED NONE DETECTED   Barbiturates, Ur Screen NONE DETECTED NONE DETECTED   Benzodiazepine, Ur Scrn NONE DETECTED NONE DETECTED   Methadone Scn, Ur NONE DETECTED NONE DETECTED     Comment: (NOTE) Tricyclics + metabolites, urine    Cutoff 1000 ng/mL Amphetamines + metabolites, urine  Cutoff 1000 ng/mL MDMA (Ecstasy), urine  Cutoff 500 ng/mL Cocaine Metabolite, urine          Cutoff 300 ng/mL Opiate + metabolites, urine        Cutoff 300 ng/mL Phencyclidine (PCP), urine         Cutoff 25 ng/mL Cannabinoid, urine                 Cutoff 50 ng/mL Barbiturates + metabolites, urine  Cutoff 200 ng/mL Benzodiazepine, urine              Cutoff 200 ng/mL Methadone, urine                   Cutoff 300 ng/mL The urine drug screen provides only a preliminary, unconfirmed analytical test result and should not be used for non-medical purposes. Clinical consideration and professional judgment should be applied to any positive drug screen result due to possible interfering substances. A more specific alternate chemical method must be used in order to obtain a confirmed analytical result. Gas chromatography / mass spectrometry (GC/MS) is the preferred confirmat ory method. Performed at [facility], [address] [place], [place] [address]   Glucose, capillary     Status: Abnormal   Collection Time: 01/21/19  7:15 PM  Result Value Ref Range   Glucose-Capillary 109 (H) 70 - 99 mg/dL  Glucose, capillary     Status: Abnormal   Collection Time: 01/22/19 10:30 AM  Result Value Ref Range   Glucose-Capillary 481 (H) 70 - 99 mg/dL  SARS Coronavirus 2 [facility] order, Performed in [facility] hospital lab) Nasopharyngeal Nasopharyngeal Swab     Status: None   Collection Time: 01/22/19 11:53 AM   Specimen: Nasopharyngeal Swab  Result Value Ref Range   SARS Coronavirus 2 NEGATIVE NEGATIVE    Comment: (NOTE) If result is NEGATIVE SARS-CoV-2 target nucleic acids are NOT DETECTED. The SARS-CoV-2 RNA is generally detectable in upper and lower  respiratory specimens during the acute phase of infection. The lowest  concentration of SARS-CoV-2 viral copies this  assay can detect is 250  copies / mL. A negative result does not preclude SARS-CoV-2 infection  and should not be used as the sole basis for treatment or other  patient management decisions.  A negative result may occur with  improper specimen collection / handling, submission of specimen other  than nasopharyngeal swab, presence of viral mutation(s) within the  areas targeted by this assay, and inadequate number of viral copies  (<250 copies / mL). A negative result must be combined with clinical  observations, patient history, and epidemiological information. If result is POSITIVE SARS-CoV-2 target nucleic acids are DETECTED. The SARS-CoV-2 RNA is generally detectable in upper and lower  respiratory specimens dur ing the acute phase of infection.  Positive  results are indicative of active infection with SARS-CoV-2.  Clinical  correlation with patient history and other diagnostic information is  necessary to determine patient infection status.  Positive results do  not rule out bacterial infection or co-infection with other viruses. If result is PRESUMPTIVE POSTIVE SARS-CoV-2 nucleic acids MAY BE PRESENT.   A presumptive positive result was obtained on the submitted specimen  and confirmed on repeat testing.  While 2019 novel coronavirus  (SARS-CoV-2) nucleic acids may be present in the submitted sample  additional confirmatory testing may be necessary for epidemiological  and / or clinical management purposes  to differentiate between  SARS-CoV-2 and other Sarbecovirus currently known to infect humans.  If clinically indicated additional testing with an  alternate test  methodology [phone]) is advised. The SARS-CoV-2 RNA is generally  detectable in upper and lower respiratory sp ecimens during the acute  phase of infection. The expected result is Negative. Fact Sheet for Patients:  StrictlyIdeas.no Fact Sheet for Healthcare  Providers: BankingDealers.co.za This test is not yet approved or cleared by the [place] FDA and has been authorized for detection and/or diagnosis of SARS-CoV-2 by FDA under an Emergency Use Authorization (EUA).  This EUA will remain in effect (meaning this test can be used) for the duration of the COVID-19 declaration under Section 564(b)(1) of the Act, 21 U.S.C. section 360bbb-3(b)(1), unless the authorization is terminated or revoked sooner. Performed at [facility], [address] [place], [place] [address]   Glucose, capillary     Status: Abnormal   Collection Time: 01/22/19 12:30 PM  Result Value Ref Range   Glucose-Capillary 462 (H) 70 - 99 mg/dL  Glucose, capillary     Status: Abnormal   Collection Time: 01/22/19  2:07 PM  Result Value Ref Range   Glucose-Capillary 489 (H) 70 - 99 mg/dL  Basic metabolic panel     Status: Abnormal   Collection Time: 01/22/19  2:31 PM  Result Value Ref Range   Sodium 135 135 - 145 mmol/L   Potassium 3.9 3.5 - 5.1 mmol/L   Chloride 100 98 - 111 mmol/L   CO2 20 (L) 22 - 32 mmol/L   Glucose, Bld 510 (HH) 70 - 99 mg/dL    Comment: CRITICAL RESULT CALLED TO, READ BACK BY AND VERIFIED WITH KAILEY WALKER @1511  ON 01/22/2019 BY FMW    BUN 28 (H) 6 - 20 mg/dL   Creatinine, Ser 1.80 (H) 0.61 - 1.24 mg/dL   Calcium 8.8 (L) 8.9 - 10.3 mg/dL   GFR calc non Af Amer 41 (L) >60 mL/min   GFR calc Af Amer 48 (L) >60 mL/min   Anion gap 15 5 - 15    Comment: Performed at [facility], [address] [place], [place] [address]  Glucose, capillary     Status: Abnormal   Collection Time: 01/22/19  3:10 PM  Result Value Ref Range   Glucose-Capillary 403 (H) 70 - 99 mg/dL  Glucose, capillary     Status: Abnormal   Collection Time: 01/22/19  4:08 PM  Result Value Ref Range   Glucose-Capillary 222 (H) 70 - 99 mg/dL  Glucose, capillary     Status: Abnormal   Collection Time: 01/22/19  5:17 PM  Result Value  Ref Range   Glucose-Capillary 161 (H) 70 - 99 mg/dL  Glucose, capillary     Status: Abnormal   Collection Time: 01/22/19  8:03 PM  Result Value Ref Range   Glucose-Capillary 221 (H) 70 - 99 mg/dL   No results found.  Pending Labs Unresulted Labs (From admission, onward)   None      Vitals/Pain Today's Vitals   01/22/19 1107 01/22/19 1220 01/22/19 1315 01/22/19 1449  BP:    126/81  Pulse:    87  Resp:    (!) 21  Temp:      TempSrc:      SpO2:    95%  Weight:      Height:      PainSc: Asleep 0-No pain 0-No pain     Isolation Precautions No active isolations  Medications Medications  albuterol (PROVENTIL) (2.5 MG/3ML) 0.083% nebulizer solution 2.5 mg (has no administration in time range)  amLODipine (NORVASC) tablet 5 mg (5  mg Oral Given 01/22/19 0900)  aspirin EC tablet 81 mg (81 mg Oral Given 01/22/19 0901)  citalopram (CELEXA) tablet 30 mg (30 mg Oral Given 01/22/19 0901)  empagliflozin (JARDIANCE) tablet 25 mg (25 mg Oral Not Given 01/22/19 0904)  fluticasone (FLONASE) 50 MCG/ACT nasal spray 2 spray (2 sprays Each Nare Given 01/22/19 0903)  levothyroxine (SYNTHROID) tablet 75 mcg (75 mcg Oral Given 01/22/19 0903)  pantoprazole (PROTONIX) EC tablet 40 mg (40 mg Oral Given 01/22/19 0902)  QUEtiapine (SEROQUEL) tablet 200 mg (200 mg Oral Given 01/21/19 2130)  simvastatin (ZOCOR) tablet 20 mg (20 mg Oral Given 01/22/19 1739)  traMADol (ULTRAM) tablet 100 mg (100 mg Oral Given 01/21/19 2223)  traZODone (DESYREL) tablet 100 mg (100 mg Oral Given 01/21/19 2129)  losartan (COZAAR) tablet 100 mg (100 mg Oral Given 01/22/19 0901)    And  hydrochlorothiazide (HYDRODIURIL) tablet 25 mg (25 mg Oral Given 01/22/19 0902)  insulin glargine (LANTUS) injection 30 Units (30 Units Subcutaneous Given 01/22/19 1112)  insulin aspart (novoLOG) injection 0-15 Units (3 Units Subcutaneous Given 01/22/19 1733)  insulin aspart (novoLOG) injection 10 Units (10 Units Subcutaneous Given 01/22/19 1734)   influenza vac split quadrivalent PF (FLUARIX) injection 0.5 mL (0.5 mLs Intramuscular Given 01/22/19 0904)  LORazepam (ATIVAN) tablet 2 mg (2 mg Oral Given 01/21/19 1952)  sodium chloride 0.9 % bolus 1,000 mL (0 mLs Intravenous Stopped 01/22/19 1534)  insulin aspart (novoLOG) injection 10 Units (10 Units Intravenous Given 01/22/19 1435)    Mobility walks Low fall risk   Focused Assessments Diabetic   R Recommendations: See Admitting Provider Note  Report given to:   Additional Notes:

## 2019-01-22 NOTE — ED Notes (Addendum)
This RN reviews chart and is made aware pt has insulin pump. Pt asked if he still had his pump on (due to SI policy pt not allowed pump) pt hands tech his insulin pump and informs this RN "it came out sometime last night." This RN sends message to diabetic RN for further instruction of needed coverage

## 2019-01-22 NOTE — ED Notes (Signed)
Hourly rounding reveals patient in room. No complaints, stable, in no acute distress. Q15 minute rounds and monitoring via Rover and Officer to continue.   

## 2019-01-22 NOTE — ED Notes (Signed)
ER MD made aware of insulin pump removal and note from diabetic RN for needed coverage

## 2019-01-22 NOTE — BH Assessment (Signed)
Patient is to be admitted to [facility] BMU by Sheran Fava, NP.  Attending Physician will be Dr. Weber Cooks.   Patient has been assigned to room 320, by [place].   Intake Paper Work has been signed and placed on patient chart.   ER staff is aware of the admission:  Glenda, ER Secretary    Dr. Ellender Hose, ER MD   Kirke Shaggy, Patient's Nurse   Elberta Fortis, Patient Access.

## 2019-01-22 NOTE — Progress Notes (Signed)
D - Patient was admitted from [facility] Emergency Department, report received from Maudie Mercury, [place]. Patient was pleasant upon arrival to the unit. Skin assessment completed with Veronique, RN, no abnormalities found. Patient denies active SI/HI/AVH and pain. Patient stated he has anxiety and depression rating them 7/10. Patient has a trach and stated that he is going to have his wife bring his suction equipment to him tomorrow. Patient said he was confused about his medications from the last time he was here and feels he needs to get them in order so he can stop having thoughts of wanting to hurt himself. Patient contracts for safety with this Probation officer.   A - Patient compliant with medication administration per MD orders. Patient oriented to his room and the unit. Patient given education. Patient given support and encouragement to be active in his treatment plan. Patient informed to let staff know if there are any issues or problems on the unit.   R - Patient being monitored Q 15 minutes for safety per unit protocol. Patient remains safe on the unit.

## 2019-01-22 NOTE — Progress Notes (Signed)
Inpatient Diabetes Program Recommendations  AACE/ADA: New Consensus Statement on Inpatient Glycemic Control (2015)  Target Ranges:  Prepandial:   less than 140 mg/dL      Peak postprandial:   less than 180 mg/dL (1-2 hours)      Critically ill patients:  140 - 180 mg/dL   Lab Results  Component Value Date   GLUCAP 109 (H) 01/21/2019   HGBA1C 7.8 (H) 01/01/2019    Review of Glycemic Control  Diabetes history: DM 1 Outpatient Diabetes medications: Tandem insulin pump with Novolog Current orders for Inpatient glycemic control: Insulin pump transitioning to SQ regimen due to IVC  Basal insulin  12A-12P 1.175 units/hour 12P-12A          1.35   units/hour  Total daily basal insulin: 30.3 units/24 hours  Carb Coverage 1:5 1 unit for every 5 grams of carbohydrates  Insulin Sensitivity 1:20 1 unit drops blood glucose 20 mg/dl  Target Glucose Goals 110 mg/dl   Inpatient Diabetes Program Recommendations:    Consider starting  Lantus 30 units starting this am Novolog 0-15 units tid  Novolog 10 units tid meal coverage  With parameters  (patient consumed at least 50% of meals and glucose is at least 80 mg/dl)  Thanks,  Tama Headings RN, MSN, BC-ADM Inpatient Diabetes Coordinator Team Pager [phone] (8a-5p)

## 2019-01-22 NOTE — ED Notes (Signed)
IVC/ Consult completed/ Will be admitted to [facility]

## 2019-01-22 NOTE — ED Notes (Signed)
Behavioral med & TTS, & charge updated on pt status/plan

## 2019-01-22 NOTE — Tx Team (Signed)
Initial Treatment Plan 01/22/2019 11:00 PM [name] [id number]    PATIENT STRESSORS: Health problems Medication change or noncompliance   PATIENT STRENGTHS: Ability for insight Supportive family/friends   PATIENT IDENTIFIED PROBLEMS: Suicidal Ideation  Anxiety  Depression                 DISCHARGE CRITERIA:  Motivation to continue treatment in a less acute level of care Verbal commitment to aftercare and medication compliance  PRELIMINARY DISCHARGE PLAN: Attend 12-step recovery group Return to previous living arrangement  PATIENT/FAMILY INVOLVEMENT: This treatment plan has been presented to and reviewed with the patient, [name]. The patient has been given the opportunity to ask questions and make suggestions.  Mallie Darting, RN 01/22/2019, 11:00 PM

## 2019-01-22 NOTE — ED Notes (Signed)
ER MD made aware of high blood glucose reading

## 2019-01-22 NOTE — ED Notes (Signed)
MD made aware of pt's blood glucose reading

## 2019-01-22 NOTE — Consult Note (Signed)
[facility] Face-to-Face Psychiatry Consult   Reason for Consult:  Suicide attempt Referring Physician:  EDP Patient Identification: [name] MRN:  [id number] Principal Diagnosis: Major depressive disorder, recurrent severe without psychotic features ([place]) Diagnosis:  Principal Problem:   Major depressive disorder, recurrent severe without psychotic features ([place])   Total Time spent with patient: 30 minutes  Subjective:   [name] is a 58 y.o. male admitted with depression and suicidal thoughts.   Patient seen and assessed at this time. He continues to endorse ongoing suicidal thoughts. He states his thoughts left and returned when he went home.He would like to continue seeking treatment to help with complete resolution of his suicidal thoughts and depression.    HPI:  [name] is an 58 y.o. male who presents to the ER due thoughts of self-harm. Patient states the thoughts have increase for the last several days. He stated he was going to shot his self in the head but patient does not have access or means to a gun. He also reports, he was recently inpatient with [facility] and he states in the admission wasn't helpful.  During the interview, the patient was calm, cooperative and pleasant. He was able to provide appropriate answers to the questions. He denies involvement with the legal system. He also denies the use of mind-altering substance. However, his UDS was positive for cocaine.   Patient is seen by this provider face-to-face.  Patient presents today with depression and suicidal thoughts. He was being seen at an outpatient visit when he endorse suicidal thoughts. He was transported here for psych evaluation. Patient is assessed at this time, and is observed eating a meal tray. He appears to be eating well and denies any sleeping dsturbances. He endorses depressed mood , however his affect is incongruent. He endorses suicidal thoughts at this time. He was recently  admitted to our facility about 3 weeks ago, and has been in the ED 2 times during the month of August.   Past Psychiatric History: depression, anxiety,   Risk to Self: Suicidal Ideation: Yes-Currently Present Suicidal Intent: No-Not Currently/Within Last 6 Months Is patient at risk for suicide?: No Suicidal Plan?: Yes-Currently Present Specify Current Suicidal Plan: Shot yourself Access to Means: No What has been your use of drugs/alcohol within the last 12 months?: Positive for cocaine How many times?: 1 Other Self Harm Risks: Drug use Triggers for Past Attempts: Spouse contact Intentional Self Injurious Behavior: None Risk to Others: Homicidal Ideation: No Thoughts of Harm to Others: No Current Homicidal Intent: No Current Homicidal Plan: No Access to Homicidal Means: No Identified Victim: Reports of none History of harm to others?: No Assessment of Violence: None Noted Violent Behavior Description: Reports of none Does patient have access to weapons?: No Criminal Charges Pending?: No Does patient have a court date: No Prior Inpatient Therapy: Prior Inpatient Therapy: Yes Prior Therapy Dates: 12/2018 & 09/2016 Prior Therapy Facilty/Provider(s): [facility] Reason for Treatment: Depression  Prior Outpatient Therapy: Prior Outpatient Therapy: Yes Prior Therapy Dates: Current Prior Therapy Facilty/Provider(s): [facility] Reason for Treatment: Depression Does patient have an ACCT team?: No Does patient have Intensive In-House Services?  : No Does patient have [facility] services? : No Does patient have P4CC services?: No  Past Medical History:  Past Medical History:  Diagnosis Date  . Depression   . Diabetes (HCC)    Insulin Pump  . Diabetes mellitus type I ([place])   . Diabetes mellitus without complication ([place])   .  GERD (gastroesophageal reflux disease)   . H/O laryngectomy   . Heel bone fracture   . Hyperlipidemia   . Hypertension   . Radicular pain  of right lower extremity   . Stroke ([place])   . Suicide attempt [facility]) 2014   damaged larynx - tracheostomy  . Thyroid disease     Past Surgical History:  Procedure Laterality Date  . COLONOSCOPY WITH PROPOFOL N/A 05/15/2018   Procedure: COLONOSCOPY WITH PROPOFOL;  Surgeon: [name], [name], MD;  Location: [facility] ENDOSCOPY;  Service: Gastroenterology;  Laterality: N/A;  . ESOPHAGOGASTRODUODENOSCOPY N/A 05/15/2018   Procedure: ESOPHAGOGASTRODUODENOSCOPY (EGD);  Surgeon: [name], [name], MD;  Location: [facility] ENDOSCOPY;  Service: Gastroenterology;  Laterality: N/A;  . FRACTURE SURGERY    . Heel bone reconstruction Left   . HERNIA REPAIR  [id number]   Umbilical hernia repair   . LARYNGECTOMY    . NECK SURGERY     fusion  . SPINE SURGERY    . TRACHEOSTOMY  2014   from SI attempt   Family History:  Family History  Problem Relation Age of Onset  . Osteoporosis Mother   . Diabetes Mother   . Hypertension Father   . Mental illness Neg Hx    Family Psychiatric  History: None reported Social History:  Social History   Substance and Sexual Activity  Alcohol Use Yes  . Alcohol/week: 2.0 standard drinks  . Types: 2 Shots of liquor per week   Comment: rare     Social History   Substance and Sexual Activity  Drug Use No   Comment: Pt denied; UDS not available    Social History   Socioeconomic History  . Marital status: Married    Spouse name: Not on file  . Number of children: Not on file  . Years of education: Not on file  . Highest education level: Not on file  Occupational History  . Not on file  Social Needs  . Financial resource strain: Not on file  . Food insecurity    Worry: Not on file    Inability: Not on file  . Transportation needs    Medical: Not on file    Non-medical: Not on file  Tobacco Use  . Smoking status: Former Smoker    Packs/day: 0.00    Types: Cigarettes    Quit date: 04/09/2013    Years since quitting: 5.7  . Smokeless tobacco: Never Used   Substance and Sexual Activity  . Alcohol use: Yes    Alcohol/week: 2.0 standard drinks    Types: 2 Shots of liquor per week    Comment: rare  . Drug use: No    Comment: Pt denied; UDS not available  . Sexual activity: Yes    Partners: Female    Birth control/protection: Condom  Lifestyle  . Physical activity    Days per week: Not on file    Minutes per session: Not on file  . Stress: Not on file  Relationships  . Social Herbalist on phone: Not on file    Gets together: Not on file    Attends religious service: Not on file    Active member of club or organization: Not on file    Attends meetings of clubs or organizations: Not on file    Relationship status: Not on file  Other Topics Concern  . Not on file  Social History Narrative  . Not on file   Additional Social History:    Allergies:  Allergies  Allergen Reactions  . Buspar [Buspirone]     Makes the patient "flip out"  . Depakote [Valproic Acid]     Causes excessive drowsiness  . Clopidogrel Rash  . Gabapentin Itching and Rash    Labs:  Results for orders placed or performed during the hospital encounter of 01/21/19 (from the past 48 hour(s))  Comprehensive metabolic panel     Status: Abnormal   Collection Time: 01/21/19  2:57 PM  Result Value Ref Range   Sodium 134 (L) 135 - 145 mmol/L   Potassium 3.5 3.5 - 5.1 mmol/L   Chloride 100 98 - 111 mmol/L   CO2 20 (L) 22 - 32 mmol/L   Glucose, Bld 284 (H) 70 - 99 mg/dL   BUN 22 (H) 6 - 20 mg/dL   Creatinine, Ser 1.47 (H) 0.61 - 1.24 mg/dL   Calcium 9.3 8.9 - 10.3 mg/dL   Total Protein 6.9 6.5 - 8.1 g/dL   Albumin 3.7 3.5 - 5.0 g/dL   AST 24 15 - 41 U/L   ALT 16 0 - 44 U/L   Alkaline Phosphatase 116 38 - 126 U/L   Total Bilirubin 0.8 0.3 - 1.2 mg/dL   GFR calc non Af Amer 53 (L) >60 mL/min   GFR calc Af Amer >60 >60 mL/min   Anion gap 14 5 - 15    Comment: Performed at [facility], [address], [place], [place] [address]   Ethanol     Status: None   Collection Time: 01/21/19  2:57 PM  Result Value Ref Range   Alcohol, Ethyl (B) <10 <10 mg/dL    Comment: (NOTE) Lowest detectable limit for serum alcohol is 10 mg/dL. For medical purposes only. Performed at [facility], [address] [place], [place] [id number]   Salicylate level     Status: None   Collection Time: 01/21/19  2:57 PM  Result Value Ref Range   Salicylate Lvl [id number] 2.8 - 30.0 mg/dL    Comment: Performed at [facility], [address] [place], [place] [address]  Acetaminophen level     Status: Abnormal   Collection Time: 01/21/19  2:57 PM  Result Value Ref Range   Acetaminophen (Tylenol), Serum <10 (L) 10 - 30 ug/mL    Comment: (NOTE) Therapeutic concentrations vary significantly. A range of 10-30 ug/mL  may be an effective concentration for many patients. However, some  are best treated at concentrations outside of this range. Acetaminophen concentrations >150 ug/mL at 4 hours after ingestion  and >50 ug/mL at 12 hours after ingestion are often associated with  toxic reactions. Performed at [facility], [address] [place], [place] [address]   cbc     Status: None   Collection Time: 01/21/19  2:57 PM  Result Value Ref Range   WBC 9.0 4.0 - 10.5 K/uL   RBC 5.42 4.22 - 5.81 MIL/uL   Hemoglobin 15.9 13.0 - 17.0 g/dL   HCT 47.3 39.0 - 52.0 %   MCV 87.3 80.0 - 100.0 fL   MCH 29.3 26.0 - 34.0 pg   MCHC 33.6 30.0 - 36.0 g/dL   RDW 13.8 11.5 - 15.5 %   Platelets 291 150 - 400 K/uL   nRBC 0.0 0.0 - 0.2 %    Comment: Performed at [facility], [address] [place], [place] [address]  Urine Drug Screen, Qualitative     Status: Abnormal   Collection Time: 01/21/19  3:00 PM  Result Value Ref Range   Tricyclic, Ur Screen NONE DETECTED NONE DETECTED   Amphetamines, Ur Screen NONE DETECTED NONE DETECTED   MDMA (Ecstasy)Ur Screen NONE DETECTED NONE DETECTED   Cocaine Metabolite,Ur [place]  POSITIVE (A) NONE DETECTED   Opiate, Ur Screen NONE DETECTED NONE DETECTED   Phencyclidine (PCP) Ur S NONE DETECTED NONE DETECTED   Cannabinoid 50 Ng, Ur [place] NONE DETECTED NONE DETECTED   Barbiturates, Ur Screen NONE DETECTED NONE DETECTED   Benzodiazepine, Ur Scrn NONE DETECTED NONE DETECTED   Methadone Scn, Ur NONE DETECTED NONE DETECTED    Comment: (NOTE) Tricyclics + metabolites, urine    Cutoff 1000 ng/mL Amphetamines + metabolites, urine  Cutoff 1000 ng/mL MDMA (Ecstasy), urine              Cutoff 500 ng/mL Cocaine Metabolite, urine          Cutoff 300 ng/mL Opiate + metabolites, urine        Cutoff 300 ng/mL Phencyclidine (PCP), urine         Cutoff 25 ng/mL Cannabinoid, urine                 Cutoff 50 ng/mL Barbiturates + metabolites, urine  Cutoff 200 ng/mL Benzodiazepine, urine              Cutoff 200 ng/mL Methadone, urine                   Cutoff 300 ng/mL The urine drug screen provides only a preliminary, unconfirmed analytical test result and should not be used for non-medical purposes. Clinical consideration and professional judgment should be applied to any positive drug screen result due to possible interfering substances. A more specific alternate chemical method must be used in order to obtain a confirmed analytical result. Gas chromatography / mass spectrometry (GC/MS) is the preferred confirmat ory method. Performed at [facility], [address] [place], [place] [address]   Glucose, capillary     Status: Abnormal   Collection Time: 01/21/19  7:15 PM  Result Value Ref Range   Glucose-Capillary 109 (H) 70 - 99 mg/dL  Glucose, capillary     Status: Abnormal   Collection Time: 01/22/19 10:30 AM  Result Value Ref Range   Glucose-Capillary 481 (H) 70 - 99 mg/dL  SARS Coronavirus 2 [facility] order, Performed in [facility] hospital lab) Nasopharyngeal Nasopharyngeal Swab     Status: None   Collection Time: 01/22/19 11:53 AM   Specimen: Nasopharyngeal  Swab  Result Value Ref Range   SARS Coronavirus 2 NEGATIVE NEGATIVE    Comment: (NOTE) If result is NEGATIVE SARS-CoV-2 target nucleic acids are NOT DETECTED. The SARS-CoV-2 RNA is generally detectable in upper and lower  respiratory specimens during the acute phase of infection. The lowest  concentration of SARS-CoV-2 viral copies this assay can detect is 250  copies / mL. A negative result does not preclude SARS-CoV-2 infection  and should not be used as the sole basis for treatment or other  patient management decisions.  A negative result may occur with  improper specimen collection / handling, submission of specimen other  than nasopharyngeal swab, presence of viral mutation(s) within the  areas targeted by this assay, and inadequate number of viral copies  (<250 copies / mL). A negative result must be combined with clinical  observations, patient history, and epidemiological information. If result is POSITIVE SARS-CoV-2 target nucleic acids are DETECTED. The SARS-CoV-2 RNA is generally detectable in upper and lower  respiratory specimens dur ing the acute phase of infection.  Positive  results are indicative of active infection with SARS-CoV-2.  Clinical  correlation with patient history and other diagnostic information is  necessary to determine patient infection status.  Positive results do  not rule out bacterial infection or co-infection with other viruses. If result is PRESUMPTIVE POSTIVE SARS-CoV-2 nucleic acids MAY BE PRESENT.   A presumptive positive result was obtained on the submitted specimen  and confirmed on repeat testing.  While 2019 novel coronavirus  (SARS-CoV-2) nucleic acids may be present in the submitted sample  additional confirmatory testing may be necessary for epidemiological  and / or clinical management purposes  to differentiate between  SARS-CoV-2 and other Sarbecovirus currently known to infect humans.  If clinically indicated additional testing  with an alternate test  methodology [id number]) is advised. The SARS-CoV-2 RNA is generally  detectable in upper and lower respiratory sp ecimens during the acute  phase of infection. The expected result is Negative. Fact Sheet for Patients:  StrictlyIdeas.no Fact Sheet for Healthcare Providers: BankingDealers.co.za This test is not yet approved or cleared by the [place] FDA and has been authorized for detection and/or diagnosis of SARS-CoV-2 by FDA under an Emergency Use Authorization (EUA).  This EUA will remain in effect (meaning this test can be used) for the duration of the COVID-19 declaration under Section 564(b)(1) of the Act, 21 U.S.C. section 360bbb-3(b)(1), unless the authorization is terminated or revoked sooner. Performed at [facility] [address] [place], [place] [address]   Glucose, capillary     Status: Abnormal   Collection Time: 01/22/19 12:30 PM  Result Value Ref Range   Glucose-Capillary 462 (H) 70 - 99 mg/dL  Glucose, capillary     Status: Abnormal   Collection Time: 01/22/19  2:07 PM  Result Value Ref Range   Glucose-Capillary 489 (H) 70 - 99 mg/dL    Current Facility-Administered Medications  Medication Dose Route Frequency Provider Last Rate Last Dose  . albuterol (PROVENTIL) (2.5 MG/3ML) 0.083% nebulizer solution 2.5 mg  2.5 mg Nebulization TID PRN Nena Polio, MD      . amLODipine (NORVASC) tablet 5 mg  5 mg Oral Daily Nena Polio, MD   5 mg at 01/22/19 0900  . aspirin EC tablet 81 mg  81 mg Oral Daily Nena Polio, MD   81 mg at 01/22/19 0901  . citalopram (CELEXA) tablet 30 mg  30 mg Oral Daily Nena Polio, MD   30 mg at 01/22/19 0901  . empagliflozin (JARDIANCE) tablet 25 mg  25 mg Oral Daily Nena Polio, MD      . fluticasone [facility]) 50 MCG/ACT nasal spray 2 spray  2 spray Each Nare Daily Nena Polio, MD   2 spray at 01/22/19 [phone]  . losartan (COZAAR) tablet  100 mg  100 mg Oral Daily Nena Polio, MD   100 mg at 01/22/19 0901   And  . hydrochlorothiazide (HYDRODIURIL) tablet 25 mg  25 mg Oral Daily Nena Polio, MD   25 mg at 01/22/19 0902  . insulin aspart (novoLOG) injection 0-15 Units  0-15 Units Subcutaneous TID WC Duffy Bruce, MD   15 Units at 01/22/19 1231  . insulin aspart (novoLOG) injection 10 Units  10 Units Subcutaneous TID WC Duffy Bruce, MD   10 Units at 01/22/19 1141  . insulin glargine (LANTUS) injection 30 Units  30 Units Subcutaneous Daily Duffy Bruce, MD   30  Units at 01/22/19 1112  . levothyroxine (SYNTHROID) tablet 75 mcg  75 mcg Oral Q0600 Nena Polio, MD   75 mcg at 01/22/19 [id number]  . pantoprazole (PROTONIX) EC tablet 40 mg  40 mg Oral BID Nena Polio, MD   40 mg at 01/22/19 0902  . QUEtiapine (SEROQUEL) tablet 200 mg  200 mg Oral QHS Nena Polio, MD   200 mg at 01/21/19 2130  . simvastatin (ZOCOR) tablet 20 mg  20 mg Oral q1800 Nena Polio, MD      . traMADol Veatrice Bourbon) tablet 100 mg  100 mg Oral Q6H PRN Nena Polio, MD   100 mg at 01/21/19 2223  . traZODone (DESYREL) tablet 100 mg  100 mg Oral QHS Nena Polio, MD   100 mg at 01/21/19 2129   Current Outpatient Medications  Medication Sig Dispense Refill  . albuterol (ACCUNEB) 1.25 MG/3ML nebulizer solution Take 1 ampule by nebulization 2 (two) times daily as needed for wheezing or shortness of breath.     Marland Kitchen amLODipine (NORVASC) 5 MG tablet Take 1 tablet (5 mg total) by mouth daily. 30 tablet 1  . aspirin EC 81 MG tablet Take 1 tablet (81 mg total) by mouth daily. 30 tablet 1  . citalopram (CELEXA) 20 MG tablet Take 1.5 tablets (30 mg total) by mouth daily. 45 tablet 1  . empagliflozin (JARDIANCE) 25 MG TABS tablet Take 25 mg by mouth daily. 30 tablet 1  . fluticasone (FLONASE) 50 MCG/ACT nasal spray Place 2 sprays into both nostrils daily. 16 g 2  . insulin aspart (NOVOLOG) 100 UNIT/ML injection Inject 1.15-1.25 Units into the skin See  admin instructions. Via insulin pump-basal rate: 1.15-1.25 units/hr (every 8 carbs = 1 unit of insulin bolus) 0000-0800 = 1.15 units/hr and 0801/2359 = 1.25 units/hr    . levothyroxine (SYNTHROID) 75 MCG tablet Take 1 tablet (75 mcg total) by mouth daily at 6 (six) AM. 30 tablet 1  . losartan-hydrochlorothiazide (HYZAAR) 100-25 MG tablet Take 1 tablet by mouth daily.    . pantoprazole (PROTONIX) 40 MG tablet Take 1 tablet (40 mg total) by mouth daily. (Patient taking differently: Take 40 mg by mouth 2 (two) times daily. ) 30 tablet 1  . QUEtiapine (SEROQUEL) 200 MG tablet Take 1 tablet (200 mg total) by mouth at bedtime. 30 tablet 1  . simvastatin (ZOCOR) 20 MG tablet Take 1 tablet (20 mg total) by mouth daily at 6 PM. 30 tablet 1  . traMADol (ULTRAM) 50 MG tablet Take 2 tablets (100 mg total) by mouth every 6 (six) hours as needed for moderate pain or severe pain. 60 tablet 1  . traZODone (DESYREL) 100 MG tablet Take 1 tablet (100 mg total) by mouth at bedtime. 30 tablet 1  . hydrochlorothiazide (HYDRODIURIL) 25 MG tablet Take 1 tablet (25 mg total) by mouth daily. (Patient not taking: Reported on 01/21/2019) 30 tablet 1  . losartan (COZAAR) 100 MG tablet Take 1 tablet (100 mg total) by mouth daily. (Patient not taking: Reported on 01/21/2019) 30 tablet 1    Musculoskeletal: Strength & Muscle Tone: within normal limits Gait & Station: normal Patient leans: N/A  Psychiatric Specialty Exam: Physical Exam  Nursing note and vitals reviewed. Constitutional: He is oriented to person, place, and time. He appears well-developed and well-nourished.  Cardiovascular: Normal rate.  Respiratory: Effort normal.  Musculoskeletal: Normal range of motion.  Neurological: He is alert and oriented to person, place, and time.  Review of Systems  Constitutional: Negative.   HENT: Negative.   Eyes: Negative.   Respiratory: Negative.   Cardiovascular: Negative.   Gastrointestinal: Negative.    Genitourinary: Negative.   Musculoskeletal: Negative.   Skin: Negative.   Neurological: Negative.   Endo/Heme/Allergies: Negative.   Psychiatric/Behavioral: Positive for depression and suicidal ideas. The patient is nervous/anxious.     Blood pressure 123/77, pulse 73, temperature 98.1 F (36.7 C), resp. rate 20, height 6' (1.829 m), weight 110.7 kg, SpO2 93 %.Body mass index is 33.09 kg/m.  General Appearance: Casual  Eye Contact:  Fair  Speech:  Clear and Coherent and Normal Rate  Volume:  Normal  Mood:  Depressed  Affect:  Congruent and Depressed  Thought Process:  Coherent, Linear and Descriptions of Associations: Intact  Orientation:  Full (Time, Place, and Person)  Thought Content:  WDL  Suicidal Thoughts:  Yes.  with intent/plan  Homicidal Thoughts:  No  Memory:  Immediate;   Good Recent;   Good Remote;   Good  Judgement:  Fair  Insight:  Fair  Psychomotor Activity:  Normal  Concentration:  Concentration: Good  Recall:  Good  Fund of Knowledge:  Good  Language:  Good  Akathisia:  No  Handed:  Right  AIMS (if indicated):     Assets:  Communication Skills Desire for Improvement Financial Resources/Insurance Housing Resilience Social Support Transportation  ADL's:  Intact  Cognition:  WNL  Sleep:        Treatment Plan Summary: Daily contact with patient to assess and evaluate symptoms and progress in treatment and Medication management  Restart home medications  Disposition: Recommend psychiatric Inpatient admission when medically cleared.  Suella Broad, FNP 01/22/2019 2:13 PM

## 2019-01-22 NOTE — ED Notes (Signed)
ER MD made aware of pt's blood glucose reading

## 2019-01-23 DIAGNOSIS — F429 Obsessive-compulsive disorder, unspecified: Secondary | ICD-10-CM

## 2019-01-23 DIAGNOSIS — F332 Major depressive disorder, recurrent severe without psychotic features: Principal | ICD-10-CM

## 2019-01-23 LAB — GLUCOSE, CAPILLARY
Glucose-Capillary: 303 mg/dL — ABNORMAL HIGH (ref 70–99)
Glucose-Capillary: 410 mg/dL — ABNORMAL HIGH (ref 70–99)
Glucose-Capillary: 445 mg/dL — ABNORMAL HIGH (ref 70–99)
Glucose-Capillary: 176 mg/dL — ABNORMAL HIGH (ref 70–99)
Glucose-Capillary: 71 mg/dL (ref 70–99)

## 2019-01-23 MED ORDER — LOPERAMIDE HCL 2 MG PO CAPS
2.0000 mg | ORAL_CAPSULE | ORAL | Status: DC | PRN
  Administered 2019-01-23 – 2019-01-24 (×3): 2 mg via ORAL
  Filled 2019-01-23 (×3): qty 1

## 2019-01-23 MED ORDER — INSULIN ASPART 100 UNIT/ML ~~LOC~~ SOLN
0.0000 [IU] | Freq: Three times a day (TID) | SUBCUTANEOUS | Status: DC
  Administered 2019-01-23: 3 [IU] via SUBCUTANEOUS
  Administered 2019-01-23 (×2): 15 [IU] via SUBCUTANEOUS
  Administered 2019-01-24 (×2): 2 [IU] via SUBCUTANEOUS
  Administered 2019-01-24: 3 [IU] via SUBCUTANEOUS
  Administered 2019-01-25: 5 [IU] via SUBCUTANEOUS
  Administered 2019-01-25 – 2019-01-26 (×2): 2 [IU] via SUBCUTANEOUS
  Administered 2019-01-26: 3 [IU] via SUBCUTANEOUS
  Administered 2019-01-27: 17:00:00 5 [IU] via SUBCUTANEOUS
  Administered 2019-01-27: 8 [IU] via SUBCUTANEOUS
  Administered 2019-01-28 (×2): 5 [IU] via SUBCUTANEOUS
  Administered 2019-01-29: 8 [IU] via SUBCUTANEOUS
  Administered 2019-01-29: 11 [IU] via SUBCUTANEOUS
  Filled 2019-01-23 (×16): qty 1

## 2019-01-23 MED ORDER — CITALOPRAM HYDROBROMIDE 20 MG PO TABS
40.0000 mg | ORAL_TABLET | Freq: Every day | ORAL | Status: DC
  Administered 2019-01-23 – 2019-01-29 (×7): 40 mg via ORAL
  Filled 2019-01-23 (×7): qty 2

## 2019-01-23 MED ORDER — INSULIN ASPART 100 UNIT/ML ~~LOC~~ SOLN
10.0000 [IU] | Freq: Three times a day (TID) | SUBCUTANEOUS | Status: DC
  Administered 2019-01-23 – 2019-01-29 (×17): 10 [IU] via SUBCUTANEOUS
  Filled 2019-01-23 (×15): qty 1

## 2019-01-23 MED ORDER — INSULIN GLARGINE 100 UNIT/ML ~~LOC~~ SOLN
33.0000 [IU] | Freq: Every day | SUBCUTANEOUS | Status: DC
  Administered 2019-01-23 – 2019-01-29 (×6): 33 [IU] via SUBCUTANEOUS
  Filled 2019-01-23 (×7): qty 0.33

## 2019-01-23 NOTE — Progress Notes (Signed)
Recreation Therapy Notes  INPATIENT RECREATION THERAPY ASSESSMENT  Patient Details Name: Neil Soun MRN: [id number] DOB: July 25, 1960 Today's Date: 01/23/2019       Information Obtained From: Patient  Able to Participate in Assessment/Interview: Yes  Patient Presentation: Responsive  Reason for Admission (Per Patient): Active Symptoms, Suicidal Ideation  Patient Stressors:    Coping Skills:   Arguments  Leisure Interests (2+):  Nature - Fishing, Therapist, music - Product manager of Recreation/Participation: Monthly  Awareness of Community Resources:     [employer]:     Current Use:    If no, Barriers?:    Expressed Interest in [employer] Information:    [employer] of Residence:  Insurance underwriter  Patient Main Form of Transportation: Musician  Patient Strengths:  N/A  Patient Identified Areas of Improvement:  Stop feeling this way  Patient Goal for Hospitalization:  Not going back into being suicidal  Current SI (including self-harm):  No  Current HI:  No  Current AVH: No  Staff Intervention Plan: Group Attendance, Collaborate with Interdisciplinary Treatment Team  Consent to Intern Participation: N/A  Abryana Lykens 01/23/2019, 2:57 PM

## 2019-01-23 NOTE — Progress Notes (Signed)
Inpatient Diabetes Program Recommendations  AACE/ADA: New Consensus Statement on Inpatient Glycemic Control   Target Ranges:  Prepandial:   less than 140 mg/dL      Peak postprandial:   less than 180 mg/dL (1-2 hours)      Critically ill patients:  140 - 180 mg/dL   Results for CHANTHA, MURAD (MRN [id number]) as of 01/23/2019 10:10  Ref. Range 01/22/2019 10:30 01/22/2019 12:30 01/22/2019 14:07 01/22/2019 15:10 01/22/2019 16:08 01/22/2019 17:17 01/22/2019 20:03 01/22/2019 21:43 01/23/2019 06:53 01/23/2019 08:27  Glucose-Capillary Latest Ref Range: 70 - 99 mg/dL 481 (H)  Novolog 10 units  Lantus 30 units @11 :12 462 (H)  Novolog 15 units 489 (H)  Novolog 10 units 403 (H) 222 (H) 161 (H)  Novolog 13 units 221 (H) 177 (H) 303 (H) 410 (H)  Novolog 15 units   Review of Glycemic Control  Outpatient Diabetes medications: Jardiance 25 mg daily, Insulin Pump with Novolog Current orders for Inpatient glycemic control: Novolog 0-15 units TID with meals, Novolog 0-5 units QHS  Inpatient Diabetes Program Recommendations:   Insulin-Basal: Please consider ordering Lantus 33 units daily (starting now).  Insulin-Meal Coverage: Please consider ordering Novolog 10 units TID with meals for meal coverage if patient eats at least 50% of meals.  Diet: Please discontinue Regular diet and order Carb Modified diet.  Thanks, Barnie Alderman, RN, MSN, CDE Diabetes Coordinator Inpatient Diabetes Program [phone] (Team Pager from 8am to 5pm)

## 2019-01-23 NOTE — Progress Notes (Signed)
Patient's CBG was 445 at 1216, this Probation officer notified MD, and administered 25 units of coverage. Patient was also informed that his diet would be changed to carb modified and patient stated to this writer, "I don't want that". This Probation officer notified MD of this as well.

## 2019-01-23 NOTE — [facility] Suicide Risk Assessment (Signed)
[facility] Admission Suicide Risk Assessment   Nursing information obtained from:  Patient Demographic factors:  Male, Low socioeconomic status Current Mental Status:  Suicidal ideation indicated by patient Loss Factors:  NA Historical Factors:  Prior suicide attempts Risk Reduction Factors:  Living with another person, especially a relative, Positive social support, Sense of responsibility to family  Total Time spent with patient: 1 hour Principal Problem: MDD (major depressive disorder), recurrent episode, severe ([place]) Diagnosis:  Principal Problem:   MDD (major depressive disorder), recurrent episode, severe ([place]) Active Problems:   Hypothyroidism   HTN (hypertension)   GERD (gastroesophageal reflux disease)   Type 1 diabetes mellitus ([place])   Tracheostomy in place [facility])  Subjective Data: Patient seen chart reviewed.  Patient with a history of recurrent severe depression and multiple past suicide attempts mostly pretty serious comes in with intensive suicidal ideation depressed mood and hopelessness.  Cooperative with treatment and not psychotic but still very down and anxious  Continued Clinical Symptoms:  Alcohol Use Disorder Identification Test Final Score (AUDIT): 0 The "Alcohol Use Disorders Identification Test", Guidelines for Use in Primary Care, Second Edition.  World Pharmacologist [facility]). Score between 0-7:  no or low risk or alcohol related problems. Score between 8-15:  moderate risk of alcohol related problems. Score between 16-19:  high risk of alcohol related problems. Score 20 or above:  warrants further diagnostic evaluation for alcohol dependence and treatment.   CLINICAL FACTORS:   Severe Anxiety and/or Agitation Depression:   Impulsivity   Musculoskeletal: Strength & Muscle Tone: within normal limits Gait & Station: normal Patient leans: N/A  Psychiatric Specialty Exam: Physical Exam  Nursing note and vitals reviewed. Constitutional: He appears  well-developed and well-nourished.  Tracheostomy in place  HENT:  Head: Normocephalic and atraumatic.  Eyes: Pupils are equal, round, and reactive to light. Conjunctivae are normal.  Neck: Normal range of motion.  Cardiovascular: Regular rhythm and normal heart sounds.  Respiratory: Effort normal.  GI: Soft.  Musculoskeletal: Normal range of motion.  Neurological: He is alert.  Skin: Skin is warm and dry.  Psychiatric: His mood appears anxious. His affect is blunt. His speech is delayed. He is slowed. Cognition and memory are impaired. He expresses impulsivity. He exhibits a depressed mood. He expresses suicidal ideation. He expresses no suicidal plans.    Review of Systems  Constitutional: Negative.   HENT: Negative.   Eyes: Negative.   Respiratory: Negative.   Cardiovascular: Negative.   Gastrointestinal: Negative.   Musculoskeletal: Negative.   Skin: Negative.   Neurological: Negative.   Psychiatric/Behavioral: Positive for depression and suicidal ideas. Negative for hallucinations, memory loss and substance abuse. The patient is nervous/anxious and has insomnia.     Blood pressure 138/79, pulse 89, temperature 98.7 F (37.1 C), temperature source Oral, resp. rate 17, height 6' (1.829 m), weight 111 kg, SpO2 98 %.Body mass index is 33.19 kg/m.  General Appearance: Disheveled  Eye Contact:  Minimal  Speech:  Slow  Volume:  Decreased  Mood:  Depressed  Affect:  Constricted  Thought Process:  Disorganized  Orientation:  Full (Time, Place, and Person)  Thought Content:  Logical  Suicidal Thoughts:  Yes.  without intent/plan  Homicidal Thoughts:  No  Memory:  Immediate;   Fair Recent;   Fair Remote;   Fair  Judgement:  Fair  Insight:  Fair  Psychomotor Activity:  Decreased  Concentration:  Concentration: Fair  Recall:  [employer] of Knowledge:  Fair  Language:  Fair  Akathisia:  No  Handed:  Right  AIMS (if indicated):     Assets:  Desire for  Improvement Housing Social Support  ADL's:  Intact  Cognition:  WNL  Sleep:  Number of Hours: 8      COGNITIVE FEATURES THAT CONTRIBUTE TO RISK:  Thought constriction (tunnel vision)    SUICIDE RISK:   Moderate:  Frequent suicidal ideation with limited intensity, and duration, some specificity in terms of plans, no associated intent, good self-control, limited dysphoria/symptomatology, some risk factors present, and identifiable protective factors, including available and accessible social support.  PLAN OF CARE: Patient will continue on 15-minute checks.  Discussed with him treatment plan addressing anxiety and depression.  Including groups.  Work on making sure that he has a solid treatment plan and discharge  I certify that inpatient services furnished can reasonably be expected to improve the patient's condition.   Alethia Berthold, MD 01/23/2019, 5:55 PM

## 2019-01-23 NOTE — [facility] Suicide Risk Assessment (Signed)
[facility] INPATIENT:  Family/Significant Other Suicide Prevention Education  Suicide Prevention Education:  Education Completed; [name], wife [phone] has been identified by the patient as the family member/significant other with whom the patient will be residing, and identified as the person(s) who will aid the patient in the event of a mental health crisis (suicidal ideations/suicide attempt).  With written consent from the patient, the family member/significant other has been provided the following suicide prevention education, prior to the and/or following the discharge of the patient.  The suicide prevention education provided includes the following:  Suicide risk factors  Suicide prevention and interventions  National Suicide Hotline telephone number  [facility] assessment telephone number  [facility] Emergency Assistance [place] and/or Residential Mobile Crisis Unit telephone number  Request made of family/significant other to:  Remove weapons (e.g., guns, rifles, knives), all items previously/currently identified as safety concern.    Remove drugs/medications (over-the-counter, prescriptions, illicit drugs), all items previously/currently identified as a safety concern.  The family member/significant other verbalizes understanding of the suicide prevention education information provided.  The family member/significant other agrees to remove the items of safety concern listed above.  CSW spoke with pts wife, [name] who reported that she believes pt is in the hospital due to being on too much medicine. [name] reported that pt typically takes about 20 pills a day and as far as she is aware, pt has been taking his psych meds since his last discharge 01/07/2019. [name] denied concerns with HI and pt returning home and reported there are no guns/weapons in the home. [name] reported she has some SI concerns as both times pt has had these issues that led to  inpatient hospitalization, she has not been home with pt and she cannot be home at all times with him.  [place] MSW LCSW 01/23/2019, 1:08 PM

## 2019-01-23 NOTE — Plan of Care (Signed)
Patient stated he is feeling better since he got back on his medications.   Problem: Education: Goal: Emotional status will improve Outcome: Progressing Goal: Mental status will improve Outcome: Progressing

## 2019-01-23 NOTE — [facility] Suicide Risk Assessment (Signed)
[facility] INPATIENT:  Family/Significant Other Suicide Prevention Education  Suicide Prevention Education:  Contact Attempts: Merel Karaman, wife [phone] has been identified by the patient as the family member/significant other with whom the patient will be residing, and identified as the person(s) who will aid the patient in the event of a mental health crisis.  With written consent from the patient, two attempts were made to provide suicide prevention education, prior to and/or following the patient's discharge.  We were unsuccessful in providing suicide prevention education.  A suicide education pamphlet was given to the patient to share with family/significant other.  Date and time of first attempt: 01/23/2019 at 10:20am Date and time of second attempt:  CSW left a HIPAA compliant voicemail requesting a call back.   [place] MSW LCSW 01/23/2019, 10:20 AM

## 2019-01-23 NOTE — [facility] Group Notes (Signed)
Balance In Life 01/23/2019 1PM  Type of Therapy/Topic:  Group Therapy:  Balance in Life  Participation Level:  Did Not Attend  Description of Group:   This group will address the concept of balance and how it feels and looks when one is unbalanced. Patients will be encouraged to process areas in their lives that are out of balance and identify reasons for remaining unbalanced. Facilitators will guide patients in utilizing problem-solving interventions to address and correct the stressor making their life unbalanced. Understanding and applying boundaries will be explored and addressed for obtaining and maintaining a balanced life. Patients will be encouraged to explore ways to assertively make their unbalanced needs known to significant others in their lives, using other group members and facilitator for support and feedback.  Therapeutic Goals: 1. Patient will identify two or more emotions or situations they have that consume much of in their lives. 2. Patient will identify signs/triggers that life has become out of balance:  3. Patient will identify two ways to set boundaries in order to achieve balance in their lives:  4. Patient will demonstrate ability to communicate their needs through discussion and/or role plays  Summary of Patient Progress:    Therapeutic Modalities:   Cognitive Behavioral Therapy Solution-Focused Therapy Assertiveness Training  Makiyla Linch Lynelle Smoke, LCSW

## 2019-01-23 NOTE — H&P (Signed)
Psychiatric Admission Assessment Adult  Patient Identification: [name] MRN:  [id number] Date of Evaluation:  01/23/2019 Chief Complaint:  major depressive Principal Diagnosis: MDD (major depressive disorder), recurrent episode, severe ([place]) Diagnosis:  Principal Problem:   MDD (major depressive disorder), recurrent episode, severe ([place]) Active Problems:   Hypothyroidism   HTN (hypertension)   GERD (gastroesophageal reflux disease)   Type 1 diabetes mellitus ([place])   Tracheostomy in place [facility])   Obsessive compulsive disorder  History of Present Illness: Patient seen chart reviewed.  Patient with a history of recurrent severe depression and anxiety who has been in and out of the hospital and emergency room for the last month admitted back to the hospital through the emergency room where he reported active suicidal thoughts.  On interview today the patient tells me that he did not feel any better at the end of his last hospitalization and since then has continued to feel oppressively down and negative.  He also is able to describe feeling anxious every day to the point of pacing back and forth in his house.  He feels like he has thoughts in his head that keep him so preoccupied and unstable that he is not able to do any of the things he wants.  Feels like he can even think clearly enough to eat breakfast.  Denies any actual hallucinations.  Denies homicidal ideation.  Suicidal ideation without a current specific plan.  He says he had been compliant with his medicine.  Had not yet followed up with outpatient treatment however and denies any substance abuse Associated Signs/Symptoms: Depression Symptoms:  insomnia, psychomotor retardation, suicidal thoughts without plan, anxiety, (Hypo) Manic Symptoms:  Impulsivity, Anxiety Symptoms:  Excessive Worry, Obsessive Compulsive Symptoms:   Intrusive obsessive thoughts, Psychotic Symptoms:  None PTSD Symptoms: Negative Total Time spent  with patient: 1 hour  Past Psychiatric History: Patient has a past history of depression going back years.  Several prior hospitalizations.  Is the patient at risk to self? Yes.    Has the patient been a risk to self in the past 6 months? Yes.    Has the patient been a risk to self within the distant past? Yes.    Is the patient a risk to others? No.  Has the patient been a risk to others in the past 6 months? No.  Has the patient been a risk to others within the distant past? No.   Prior Inpatient Therapy:   Prior Outpatient Therapy:    Alcohol Screening: 1. How often do you have a drink containing alcohol?: Never 2. How many drinks containing alcohol do you have on a typical day when you are drinking?: 1 or 2 3. How often do you have six or more drinks on one occasion?: Never AUDIT-C Score: 0 4. How often during the last year have you found that you were not able to stop drinking once you had started?: Never 5. How often during the last year have you failed to do what was normally expected from you becasue of drinking?: Never 6. How often during the last year have you needed a first drink in the morning to get yourself going after a heavy drinking session?: Never 7. How often during the last year have you had a feeling of guilt of remorse after drinking?: Never 8. How often during the last year have you been unable to remember what happened the night before because you had been drinking?: Never 9. Have you or someone else been  injured as a result of your drinking?: No 10. Has a relative or friend or a doctor or another health worker been concerned about your drinking or suggested you cut down?: No Alcohol Use Disorder Identification Test Final Score (AUDIT): 0 Alcohol Brief Interventions/Follow-up: AUDIT Score <7 follow-up not indicated Substance Abuse History in the last 12 months:  No. Consequences of Substance Abuse: Negative Previous Psychotropic Medications: Yes  Psychological  Evaluations: Yes  Past Medical History:  Past Medical History:  Diagnosis Date  . Depression   . Diabetes (HCC)    Insulin Pump  . Diabetes mellitus type I ([place])   . Diabetes mellitus without complication ([place])   . GERD (gastroesophageal reflux disease)   . H/O laryngectomy   . Heel bone fracture   . Hyperlipidemia   . Hypertension   . Radicular pain of right lower extremity   . Stroke ([place])   . Suicide attempt [facility]) 2014   damaged larynx - tracheostomy  . Thyroid disease     Past Surgical History:  Procedure Laterality Date  . COLONOSCOPY WITH PROPOFOL N/A 05/15/2018   Procedure: COLONOSCOPY WITH PROPOFOL;  Surgeon: [name], [name], MD;  Location: [facility] ENDOSCOPY;  Service: Gastroenterology;  Laterality: N/A;  . ESOPHAGOGASTRODUODENOSCOPY N/A 05/15/2018   Procedure: ESOPHAGOGASTRODUODENOSCOPY (EGD);  Surgeon: [name], [name], MD;  Location: [facility] ENDOSCOPY;  Service: Gastroenterology;  Laterality: N/A;  . FRACTURE SURGERY    . Heel bone reconstruction Left   . HERNIA REPAIR  [id number]   Umbilical hernia repair   . LARYNGECTOMY    . NECK SURGERY     fusion  . SPINE SURGERY    . TRACHEOSTOMY  2014   from SI attempt   Family History:  Family History  Problem Relation Age of Onset  . Osteoporosis Mother   . Diabetes Mother   . Hypertension Father   . Mental illness Neg Hx    Family Psychiatric  History: None reported Tobacco Screening:   Social History:  Social History   Substance and Sexual Activity  Alcohol Use Yes  . Alcohol/week: 2.0 standard drinks  . Types: 2 Shots of liquor per week   Comment: rare     Social History   Substance and Sexual Activity  Drug Use No   Comment: Pt denied; UDS not available    Additional Social History: Marital status: Married Number of Years Married: 1 What types of issues is patient dealing with in the relationship?: Pt reports "cell phone stuff.  She thinks that I am going on dating websites and gets mad.  I'm not.: Are  you sexually active?: No What is your sexual orientation?: heterosexual Has your sexual activity been affected by drugs, alcohol, medication, or emotional stress?: Pt reports that sex drive has been negative impacted by medications. Does patient have children?: Yes How many children?: 4 How is patient's relationship with their children?: Pt reports "I don't see them much, 2 are in the [place], one is in college and the other I am not that close with.  They're all so busy."                         Allergies:   Allergies  Allergen Reactions  . Buspar [Buspirone]     Makes the patient "flip out"  . Depakote [Valproic Acid]     Causes excessive drowsiness  . Clopidogrel Rash  . Gabapentin Itching and Rash   Lab Results:  Results for orders placed or  performed during the hospital encounter of 01/22/19 (from the past 48 hour(s))  Glucose, capillary     Status: Abnormal   Collection Time: 01/22/19  9:43 PM  Result Value Ref Range   Glucose-Capillary 177 (H) 70 - 99 mg/dL  Glucose, capillary     Status: Abnormal   Collection Time: 01/23/19  6:53 AM  Result Value Ref Range   Glucose-Capillary 303 (H) 70 - 99 mg/dL  Glucose, capillary     Status: Abnormal   Collection Time: 01/23/19  8:27 AM  Result Value Ref Range   Glucose-Capillary 410 (H) 70 - 99 mg/dL  Glucose, capillary     Status: Abnormal   Collection Time: 01/23/19 12:16 PM  Result Value Ref Range   Glucose-Capillary 445 (H) 70 - 99 mg/dL  Glucose, capillary     Status: Abnormal   Collection Time: 01/23/19  4:24 PM  Result Value Ref Range   Glucose-Capillary 176 (H) 70 - 99 mg/dL    Blood Alcohol level:  Lab Results  Component Value Date   ETH <10 01/21/2019   ETH <10 [id number]    Metabolic Disorder Labs:  Lab Results  Component Value Date   HGBA1C 7.8 (H) 01/01/2019   MPG 177.16 01/01/2019   MPG 174.29 12/23/2018   No results found for: PROLACTIN Lab Results  Component Value Date   CHOL 161  09/15/2016   TRIG 190 (H) 09/15/2016   HDL 41 09/15/2016   CHOLHDL 3.9 09/15/2016   VLDL 38 09/15/2016   LDLCALC 82 09/15/2016    Current Medications: Current Facility-Administered Medications  Medication Dose Route Frequency Provider Last Rate Last Dose  . acetaminophen (TYLENOL) tablet 650 mg  650 mg Oral Q6H PRN Suella Broad, FNP      . albuterol (PROVENTIL) (2.5 MG/3ML) 0.083% nebulizer solution 2.5 mg  2.5 mg Nebulization TID PRN Burt Ek Gayland Curry, FNP      . alum & mag hydroxide-simeth (MAALOX/MYLANTA) 200-200-20 MG/5ML suspension 30 mL  30 mL Oral Q4H PRN Starkes-Perry, Gayland Curry, FNP      . amLODipine (NORVASC) tablet 5 mg  5 mg Oral Daily Suella Broad, FNP   5 mg at 01/23/19 0828  . aspirin EC tablet 81 mg  81 mg Oral Daily Suella Broad, FNP   81 mg at 01/23/19 [id number]  . citalopram (CELEXA) tablet 40 mg  40 mg Oral Daily Clapacs, [name], MD   40 mg at 01/23/19 1620  . hydrOXYzine (ATARAX/VISTARIL) tablet 25 mg  25 mg Oral TID PRN Suella Broad, FNP   25 mg at 01/22/19 2145  . insulin aspart (novoLOG) injection 0-15 Units  0-15 Units Subcutaneous TID WC Clapacs, [name], MD   3 Units at 01/23/19 1710  . insulin aspart (novoLOG) injection 0-5 Units  0-5 Units Subcutaneous QHS Suella Broad, FNP      . insulin aspart (novoLOG) injection 10 Units  10 Units Subcutaneous TID WC Clapacs, [name], MD   10 Units at 01/23/19 1709  . insulin glargine (LANTUS) injection 33 Units  33 Units Subcutaneous Daily Clapacs, [name], MD   33 Units at 01/23/19 1133  . loperamide (IMODIUM) capsule 2 mg  2 mg Oral PRN Clapacs, [name], MD   2 mg at 01/23/19 1751  . magnesium hydroxide (MILK OF MAGNESIA) suspension 30 mL  30 mL Oral Daily PRN Suella Broad, FNP      . traZODone (DESYREL) tablet 100 mg  100 mg Oral QHS  PRN Suella Broad, FNP   100 mg at 01/22/19 2146   PTA Medications: Medications Prior to Admission  Medication Sig Dispense  Refill Last Dose  . albuterol (ACCUNEB) 1.25 MG/3ML nebulizer solution Take 1 ampule by nebulization 2 (two) times daily as needed for wheezing or shortness of breath.      Marland Kitchen amLODipine (NORVASC) 5 MG tablet Take 1 tablet (5 mg total) by mouth daily. 30 tablet 1   . aspirin EC 81 MG tablet Take 1 tablet (81 mg total) by mouth daily. 30 tablet 1   . citalopram (CELEXA) 20 MG tablet Take 1.5 tablets (30 mg total) by mouth daily. 45 tablet 1   . empagliflozin (JARDIANCE) 25 MG TABS tablet Take 25 mg by mouth daily. 30 tablet 1   . fluticasone (FLONASE) 50 MCG/ACT nasal spray Place 2 sprays into both nostrils daily. 16 g 2   . hydrochlorothiazide (HYDRODIURIL) 25 MG tablet Take 1 tablet (25 mg total) by mouth daily. (Patient not taking: Reported on 01/21/2019) 30 tablet 1   . insulin aspart (NOVOLOG) 100 UNIT/ML injection Inject 1.15-1.25 Units into the skin See admin instructions. Via insulin pump-basal rate: 1.15-1.25 units/hr (every 8 carbs = 1 unit of insulin bolus) 0000-0800 = 1.15 units/hr and 0801/2359 = 1.25 units/hr     . levothyroxine (SYNTHROID) 75 MCG tablet Take 1 tablet (75 mcg total) by mouth daily at 6 (six) AM. 30 tablet 1   . losartan (COZAAR) 100 MG tablet Take 1 tablet (100 mg total) by mouth daily. (Patient not taking: Reported on 01/21/2019) 30 tablet 1   . losartan-hydrochlorothiazide (HYZAAR) 100-25 MG tablet Take 1 tablet by mouth daily.     . pantoprazole (PROTONIX) 40 MG tablet Take 1 tablet (40 mg total) by mouth daily. (Patient taking differently: Take 40 mg by mouth 2 (two) times daily. ) 30 tablet 1   . QUEtiapine (SEROQUEL) 200 MG tablet Take 1 tablet (200 mg total) by mouth at bedtime. 30 tablet 1   . simvastatin (ZOCOR) 20 MG tablet Take 1 tablet (20 mg total) by mouth daily at 6 PM. 30 tablet 1   . traMADol (ULTRAM) 50 MG tablet Take 2 tablets (100 mg total) by mouth every 6 (six) hours as needed for moderate pain or severe pain. 60 tablet 1   . traZODone (DESYREL) 100 MG  tablet Take 1 tablet (100 mg total) by mouth at bedtime. 30 tablet 1     Musculoskeletal: Strength & Muscle Tone: within normal limits Gait & Station: normal Patient leans: N/A  Psychiatric Specialty Exam: Physical Exam  Nursing note and vitals reviewed. Constitutional: He appears well-developed and well-nourished.  HENT:  Head: Normocephalic and atraumatic.  Eyes: Pupils are equal, round, and reactive to light. Conjunctivae are normal.  Neck: Normal range of motion.  Cardiovascular: Regular rhythm and normal heart sounds.  Respiratory: Effort normal. No respiratory distress.  GI: Soft.  Musculoskeletal: Normal range of motion.  Neurological: He is alert.  Skin: Skin is warm and dry.  Psychiatric: His mood appears anxious. His speech is delayed. He is slowed. Cognition and memory are impaired. He expresses impulsivity and inappropriate judgment. He exhibits a depressed mood. He expresses suicidal ideation. He expresses no suicidal plans.    Review of Systems  Constitutional: Negative.   HENT: Negative.   Eyes: Negative.   Respiratory: Negative.   Cardiovascular: Negative.   Gastrointestinal: Negative.   Musculoskeletal: Negative.   Skin: Negative.   Neurological: Negative.   Psychiatric/Behavioral:  Positive for depression and suicidal ideas. Negative for hallucinations and substance abuse. The patient is nervous/anxious and has insomnia.     Blood pressure 138/79, pulse 89, temperature 98.7 F (37.1 C), temperature source Oral, resp. rate 17, height 6' (1.829 m), weight 111 kg, SpO2 98 %.Body mass index is 33.19 kg/m.  General Appearance: Casual  Eye Contact:  Minimal  Speech:  Slow  Volume:  Decreased  Mood:  Anxious and Depressed  Affect:  Constricted  Thought Process:  Disorganized  Orientation:  Full (Time, Place, and Person)  Thought Content:  Logical, Rumination and Tangential  Suicidal Thoughts:  Yes.  without intent/plan  Homicidal Thoughts:  No  Memory:   Immediate;   Fair Recent;   Fair Remote;   Fair  Judgement:  Fair  Insight:  Fair  Psychomotor Activity:  Decreased and Restlessness  Concentration:  Concentration: Poor  Recall:  Poor  Fund of Knowledge:  Fair  Language:  Fair  Akathisia:  No  Handed:  Right  AIMS (if indicated):     Assets:  Desire for Improvement Financial Resources/Insurance Housing Social Support  ADL's:  Intact  Cognition:  Impaired,  Mild  Sleep:  Number of Hours: 8    Treatment Plan Summary: Daily contact with patient to assess and evaluate symptoms and progress in treatment, Medication management and Plan After quite some talk with the patient it seems like anxiety and especially obsessive anxiety is a major factor and may be a big part of what drives his depression.  Reviewed treatment options and I think we will try increasing the citalopram to 40 mg.  Continue the antipsychotic for sleep and treatment of depression.  Try to encourage groups.  Make sure physical needs are being taken care of.  I talked with the patient about how he is a potential good candidate for ECT although we would not be able to start until Monday at the earliest.  We will continue to review this option.  Observation Level/Precautions:  15 minute checks  Laboratory:  Chemistry Profile  Psychotherapy:    Medications:    Consultations:    Discharge Concerns:    Estimated LOS:  Other:     Physician Treatment Plan for Primary Diagnosis: MDD (major depressive disorder), recurrent episode, severe ([place]) Long Term Goal(s): Improvement in symptoms so as ready for discharge  Short Term Goals: Ability to disclose and discuss suicidal ideas and Ability to demonstrate self-control will improve  Physician Treatment Plan for Secondary Diagnosis: Principal Problem:   MDD (major depressive disorder), recurrent episode, severe ([place]) Active Problems:   Hypothyroidism   HTN (hypertension)   GERD (gastroesophageal reflux disease)   Type 1  diabetes mellitus ([place])   Tracheostomy in place ([place])   Obsessive compulsive disorder  Long Term Goal(s): Improvement in symptoms so as ready for discharge  Short Term Goals: Compliance with prescribed medications will improve  I certify that inpatient services furnished can reasonably be expected to improve the patient's condition.    Alethia Berthold, MD 9/17/20205:58 PM

## 2019-01-23 NOTE — Tx Team (Addendum)
Interdisciplinary Treatment and Diagnostic Plan Update  01/23/2019 Time of Session: 900am [name] MRN: [id number]  Principal Diagnosis: <principal problem not specified>  Secondary Diagnoses: Active Problems:   MDD (major depressive disorder), recurrent episode, severe (HCC)   Current Medications:  Current Facility-Administered Medications  Medication Dose Route Frequency Provider Last Rate Last Dose  . acetaminophen (TYLENOL) tablet 650 mg  650 mg Oral Q6H PRN Suella Broad, FNP      . albuterol (PROVENTIL) (2.5 MG/3ML) 0.083% nebulizer solution 2.5 mg  2.5 mg Nebulization TID PRN Burt Ek Gayland Curry, FNP      . alum & mag hydroxide-simeth (MAALOX/MYLANTA) 200-200-20 MG/5ML suspension 30 mL  30 mL Oral Q4H PRN Starkes-Perry, Gayland Curry, FNP      . amLODipine (NORVASC) tablet 5 mg  5 mg Oral Daily Suella Broad, FNP   5 mg at 01/23/19 0828  . aspirin EC tablet 81 mg  81 mg Oral Daily Suella Broad, FNP   81 mg at 01/23/19 2025  . hydrOXYzine (ATARAX/VISTARIL) tablet 25 mg  25 mg Oral TID PRN Suella Broad, FNP   25 mg at 01/22/19 2145  . insulin aspart (novoLOG) injection 0-15 Units  0-15 Units Subcutaneous TID WC Clapacs, [name], MD   15 Units at 01/23/19 0914  . insulin aspart (novoLOG) injection 0-5 Units  0-5 Units Subcutaneous QHS Starkes-Perry, Takia S, FNP      . magnesium hydroxide (MILK OF MAGNESIA) suspension 30 mL  30 mL Oral Daily PRN Starkes-Perry, Gayland Curry, FNP      . traZODone (DESYREL) tablet 100 mg  100 mg Oral QHS PRN Suella Broad, FNP   100 mg at 01/22/19 2146   PTA Medications: Medications Prior to Admission  Medication Sig Dispense Refill Last Dose  . albuterol (ACCUNEB) 1.25 MG/3ML nebulizer solution Take 1 ampule by nebulization 2 (two) times daily as needed for wheezing or shortness of breath.      Marland Kitchen amLODipine (NORVASC) 5 MG tablet Take 1 tablet (5 mg total) by mouth daily. 30 tablet 1   . aspirin EC 81 MG  tablet Take 1 tablet (81 mg total) by mouth daily. 30 tablet 1   . citalopram (CELEXA) 20 MG tablet Take 1.5 tablets (30 mg total) by mouth daily. 45 tablet 1   . empagliflozin (JARDIANCE) 25 MG TABS tablet Take 25 mg by mouth daily. 30 tablet 1   . fluticasone (FLONASE) 50 MCG/ACT nasal spray Place 2 sprays into both nostrils daily. 16 g 2   . hydrochlorothiazide (HYDRODIURIL) 25 MG tablet Take 1 tablet (25 mg total) by mouth daily. (Patient not taking: Reported on 01/21/2019) 30 tablet 1   . insulin aspart (NOVOLOG) 100 UNIT/ML injection Inject 1.15-1.25 Units into the skin See admin instructions. Via insulin pump-basal rate: 1.15-1.25 units/hr (every 8 carbs = 1 unit of insulin bolus) 0000-0800 = 1.15 units/hr and 0801/2359 = 1.25 units/hr     . levothyroxine (SYNTHROID) 75 MCG tablet Take 1 tablet (75 mcg total) by mouth daily at 6 (six) AM. 30 tablet 1   . losartan (COZAAR) 100 MG tablet Take 1 tablet (100 mg total) by mouth daily. (Patient not taking: Reported on 01/21/2019) 30 tablet 1   . losartan-hydrochlorothiazide (HYZAAR) 100-25 MG tablet Take 1 tablet by mouth daily.     . pantoprazole (PROTONIX) 40 MG tablet Take 1 tablet (40 mg total) by mouth daily. (Patient taking differently: Take 40 mg by mouth 2 (two) times daily. )  30 tablet 1   . QUEtiapine (SEROQUEL) 200 MG tablet Take 1 tablet (200 mg total) by mouth at bedtime. 30 tablet 1   . simvastatin (ZOCOR) 20 MG tablet Take 1 tablet (20 mg total) by mouth daily at 6 PM. 30 tablet 1   . traMADol (ULTRAM) 50 MG tablet Take 2 tablets (100 mg total) by mouth every 6 (six) hours as needed for moderate pain or severe pain. 60 tablet 1   . traZODone (DESYREL) 100 MG tablet Take 1 tablet (100 mg total) by mouth at bedtime. 30 tablet 1     Patient Stressors: Health problems Medication change or noncompliance  Patient Strengths: Ability for insight Supportive family/friends  Treatment Modalities: Medication Management, Group therapy, Case  management,  1 to 1 session with clinician, Psychoeducation, Recreational therapy.   Physician Treatment Plan for Primary Diagnosis: <principal problem not specified> Long Term Goal(s):     Short Term Goals:    Medication Management: Evaluate patient's response, side effects, and tolerance of medication regimen.  Therapeutic Interventions: 1 to 1 sessions, Unit Group sessions and Medication administration.  Evaluation of Outcomes: Not Met  Physician Treatment Plan for Secondary Diagnosis: Active Problems:   MDD (major depressive disorder), recurrent episode, severe ([place])  Long Term Goal(s):     Short Term Goals:       Medication Management: Evaluate patient's response, side effects, and tolerance of medication regimen.  Therapeutic Interventions: 1 to 1 sessions, Unit Group sessions and Medication administration.  Evaluation of Outcomes: Not Met   RN Treatment Plan for Primary Diagnosis: <principal problem not specified> Long Term Goal(s): Knowledge of disease and therapeutic regimen to maintain health will improve  Short Term Goals: Ability to demonstrate self-control, Ability to participate in decision making will improve and Compliance with prescribed medications will improve  Medication Management: RN will administer medications as ordered by provider, will assess and evaluate patient's response and provide education to patient for prescribed medication. RN will report any adverse and/or side effects to prescribing provider.  Therapeutic Interventions: 1 on 1 counseling sessions, Psychoeducation, Medication administration, Evaluate responses to treatment, Monitor vital signs and CBGs as ordered, Perform/monitor CIWA, COWS, AIMS and Fall Risk screenings as ordered, Perform wound care treatments as ordered.  Evaluation of Outcomes: Not Met   LCSW Treatment Plan for Primary Diagnosis: <principal problem not specified> Long Term Goal(s): Safe transition to appropriate next  level of care at discharge, Engage patient in therapeutic group addressing interpersonal concerns.  Short Term Goals: Engage patient in aftercare planning with referrals and resources, Facilitate acceptance of mental health diagnosis and concerns and Increase skills for wellness and recovery  Therapeutic Interventions: Assess for all discharge needs, 1 to 1 time with Social worker, Explore available resources and support systems, Assess for adequacy in community support network, Educate family and significant other(s) on suicide prevention, Complete Psychosocial Assessment, Interpersonal group therapy.  Evaluation of Outcomes: Not Met   Progress in Treatment: Attending groups: No. Participating in groups: No. Taking medication as prescribed: Yes. Toleration medication: Yes. Family/Significant other contact made: No, will contact:  pts wife and mother Patient understands diagnosis: Yes. Discussing patient identified problems/goals with staff: Yes. Medical problems stabilized or resolved: Yes. Denies suicidal/homicidal ideation: Yes. Issues/concerns per patient self-inventory: No. Other: N/A  New problem(s) identified: No, Describe:  none  New Short Term/Long Term Goal(s): Detox, elimination of AVH/symptoms of psychosis, medication management for mood stabilization; elimination of SI thoughts; development of comprehensive mental wellness/sobriety plan.   Patient Goals:  "  Get some medicine so I dont go back to the want to kill myself stage"  Discharge Plan or Barriers: SPE pamphlet, Mobile Crisis information, and AA/NA information provided to patient for additional community support and resources. Pt is requesting referral to [facility]. CSW assessing for appropriate referrals.  Reason for Continuation of Hospitalization: Depression Medication stabilization Suicidal ideation  Estimated Length of Stay: 5-7 days  Recreational Therapy: Patient Stressors: N/A Patient Goal: Patient  will engage in groups without prompting or encouragement from LRT x3 group sessions within 5 recreation therapy group sessions   Attendees: Patient: [name] 01/23/2019 10:06 AM  Physician: Dr Weber Cooks MD 01/23/2019 10:06 AM  Nursing: Lyda Kalata RN 01/23/2019 10:06 AM  RN Care Manager: 01/23/2019 10:06 AM  Social Worker: Minette Brine Moton LCSW 01/23/2019 10:06 AM  Recreational Therapist: Roanna Epley CTRS LRT 01/23/2019 10:06 AM  Other: Sanjuana Kava LCSW 01/23/2019 10:06 AM  Other: Assunta Curtis LCSW 01/23/2019 10:06 AM  Other: 01/23/2019 10:06 AM    Scribe for Treatment Team: Mariann Laster Moton, LCSW 01/23/2019 10:06 AM

## 2019-01-23 NOTE — Progress Notes (Signed)
D - Patient was in his room upon arrival to the unit. Patient was pleasant during assessment. Patient denies SI/HI/AVH, pain, anxiety and depression with this Probation officer. Patient said he was feeling better since he was back on his medications. Patient was observed interacting appropriately with staff and peers on the unit.   A - Patient compliant with medication administration per MD orders. Patient oriented to his room and the unit. Patient given education. Patient given support and encouragement to be active in his treatment plan. Patient informed to let staff know if there are any issues or problems on the unit.   R - Patient being monitored Q 15 minutes for safety per unit protocol. Patient remains safe on the unit.

## 2019-01-23 NOTE — Progress Notes (Signed)
Recreation Therapy Notes  Date: 01/23/2019  Time: 9:30 am   Location: Craft room   Behavioral response: N/A   Intervention Topic: Problem Solving  Discussion/Intervention: Patient did not attend group.   Clinical Observations/Feedback:  Patient did not attend group.   Arabel Barcenas LRT/CTRS        Caide Campi 01/23/2019 10:50 AM

## 2019-01-23 NOTE — Plan of Care (Signed)
D- Patient alert and oriented. Patient presents in a pleasant mood on assessment stating that he slept "good" last night, "I'm still sleepy now". Patient reported depression/anxiety on his self-inventory, rating them an "8/10" and "10/10", however, he did not endorse any of this to this Probation officer. Patient denies SI, HI, AVH, and pain at this time. Patient's goals for today is to "manage meds", in which he will "sleep" in order to accomplish his goal.  A- Scheduled medications administered to patient, per MD orders. Support and encouragement provided.  Routine safety checks conducted every 15 minutes.  Patient informed to notify staff with problems or concerns.  R- No adverse drug reactions noted. Patient contracts for safety at this time. Patient compliant with medications and treatment plan. Patient receptive, calm, and cooperative. Patient interacts well with others on the unit.  Patient remains safe at this time.   Problem: Education: Goal: Knowledge of [facility] Education information/materials will improve Outcome: Progressing Goal: Emotional status will improve Outcome: Progressing Goal: Mental status will improve Outcome: Progressing Goal: Verbalization of understanding the information provided will improve Outcome: Progressing   Problem: Safety: Goal: Periods of time without injury will increase Outcome: Progressing   Problem: Education: Goal: Ability to make informed decisions regarding treatment will improve Outcome: Progressing   Problem: Medication: Goal: Compliance with prescribed medication regimen will improve Outcome: Progressing
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
## 2019-01-23 NOTE — Progress Notes (Signed)
Patient's CBG was 410 at 0827, MD was notified and 15 units of Insulin was given per MD request. Patient will continue to be monitored.

## 2019-01-24 ENCOUNTER — Telehealth: Payer: Self-pay

## 2019-01-24 LAB — GLUCOSE, CAPILLARY
Glucose-Capillary: 145 mg/dL — ABNORMAL HIGH (ref 70–99)
Glucose-Capillary: 165 mg/dL — ABNORMAL HIGH (ref 70–99)
Glucose-Capillary: 149 mg/dL — ABNORMAL HIGH (ref 70–99)
Glucose-Capillary: 116 mg/dL — ABNORMAL HIGH (ref 70–99)

## 2019-01-24 MED ORDER — QUETIAPINE FUMARATE 25 MG PO TABS
50.0000 mg | ORAL_TABLET | ORAL | Status: DC | PRN
  Administered 2019-01-24 – 2019-01-26 (×9): 50 mg via ORAL
  Filled 2019-01-24 (×11): qty 2

## 2019-01-24 NOTE — Progress Notes (Signed)
[facility] MD Progress Note  01/24/2019 3:31 PM [name]  MRN:  [id number] Subjective: Follow-up for this patient with recurrent major depression and also what I think is probably more than a little bit of obsessive-compulsive disorder.  Patient says his mood is still feeling bad.  Still feels like his thoughts are out of his control.  Still feels like he wishes that he could die and has thoughts about killing himself but has no plan of acting on it here.  Patient stays disheveled withdrawn not very interactive.  His blood sugars have gotten under better control.  Today we had a talk about ECT.  I impressed upon him that this would be an indicated treatment given his failure to respond to recent medications and the severity of his depression with repeated suicide attempts Principal Problem: MDD (major depressive disorder), recurrent episode, severe ([place]) Diagnosis: Principal Problem:   MDD (major depressive disorder), recurrent episode, severe ([place]) Active Problems:   Hypothyroidism   HTN (hypertension)   GERD (gastroesophageal reflux disease)   Type 1 diabetes mellitus ([place])   Tracheostomy in place ([place])   Obsessive compulsive disorder  Total Time spent with patient: 30 minutes  Past Psychiatric History: Patient has a past history of recurrent depression and anxiety with several previous suicide attempts going back years  Past Medical History:  Past Medical History:  Diagnosis Date  . Depression   . Diabetes (HCC)    Insulin Pump  . Diabetes mellitus type I ([place])   . Diabetes mellitus without complication ([place])   . GERD (gastroesophageal reflux disease)   . H/O laryngectomy   . Heel bone fracture   . Hyperlipidemia   . Hypertension   . Radicular pain of right lower extremity   . Stroke ([place])   . Suicide attempt [facility]) 2014   damaged larynx - tracheostomy  . Thyroid disease     Past Surgical History:  Procedure Laterality Date  . COLONOSCOPY WITH PROPOFOL N/A 05/15/2018    Procedure: COLONOSCOPY WITH PROPOFOL;  Surgeon: [name], [name], MD;  Location: [facility] ENDOSCOPY;  Service: Gastroenterology;  Laterality: N/A;  . ESOPHAGOGASTRODUODENOSCOPY N/A 05/15/2018   Procedure: ESOPHAGOGASTRODUODENOSCOPY (EGD);  Surgeon: [name], [name], MD;  Location: [facility] ENDOSCOPY;  Service: Gastroenterology;  Laterality: N/A;  . FRACTURE SURGERY    . Heel bone reconstruction Left   . HERNIA REPAIR  [id number]   Umbilical hernia repair   . LARYNGECTOMY    . NECK SURGERY     fusion  . SPINE SURGERY    . TRACHEOSTOMY  2014   from SI attempt   Family History:  Family History  Problem Relation Age of Onset  . Osteoporosis Mother   . Diabetes Mother   . Hypertension Father   . Mental illness Neg Hx    Family Psychiatric  History: See previous Social History:  Social History   Substance and Sexual Activity  Alcohol Use Yes  . Alcohol/week: 2.0 standard drinks  . Types: 2 Shots of liquor per week   Comment: rare     Social History   Substance and Sexual Activity  Drug Use No   Comment: Pt denied; UDS not available    Social History   Socioeconomic History  . Marital status: Married    Spouse name: Not on file  . Number of children: Not on file  . Years of education: Not on file  . Highest education level: Not on file  Occupational History  . Not on file  Social  Needs  . Financial resource strain: Not on file  . Food insecurity    Worry: Not on file    Inability: Not on file  . Transportation needs    Medical: Not on file    Non-medical: Not on file  Tobacco Use  . Smoking status: Former Smoker    Packs/day: 0.00    Types: Cigarettes    Quit date: 04/09/2013    Years since quitting: 5.7  . Smokeless tobacco: Never Used  Substance and Sexual Activity  . Alcohol use: Yes    Alcohol/week: 2.0 standard drinks    Types: 2 Shots of liquor per week    Comment: rare  . Drug use: No    Comment: Pt denied; UDS not available  . Sexual activity: Yes     Partners: Female    Birth control/protection: Condom  Lifestyle  . Physical activity    Days per week: Not on file    Minutes per session: Not on file  . Stress: Not on file  Relationships  . Social Herbalist on phone: Not on file    Gets together: Not on file    Attends religious service: Not on file    Active member of club or organization: Not on file    Attends meetings of clubs or organizations: Not on file    Relationship status: Not on file  Other Topics Concern  . Not on file  Social History Narrative  . Not on file   Additional Social History:                         Sleep: Fair  Appetite:  Fair  Current Medications: Current Facility-Administered Medications  Medication Dose Route Frequency Provider Last Rate Last Dose  . acetaminophen (TYLENOL) tablet 650 mg  650 mg Oral Q6H PRN Suella Broad, FNP      . albuterol (PROVENTIL) (2.5 MG/3ML) 0.083% nebulizer solution 2.5 mg  2.5 mg Nebulization TID PRN Burt Ek Gayland Curry, FNP      . alum & mag hydroxide-simeth (MAALOX/MYLANTA) 200-200-20 MG/5ML suspension 30 mL  30 mL Oral Q4H PRN Starkes-Perry, Gayland Curry, FNP      . amLODipine (NORVASC) tablet 5 mg  5 mg Oral Daily Suella Broad, FNP   5 mg at 01/24/19 0742  . aspirin EC tablet 81 mg  81 mg Oral Daily Suella Broad, FNP   81 mg at 01/24/19 0744  . citalopram (CELEXA) tablet 40 mg  40 mg Oral Daily Clapacs, [name], MD   40 mg at 01/24/19 0742  . hydrOXYzine (ATARAX/VISTARIL) tablet 25 mg  25 mg Oral TID PRN Suella Broad, FNP   25 mg at 01/24/19 1142  . insulin aspart (novoLOG) injection 0-15 Units  0-15 Units Subcutaneous TID WC Clapacs, [name], MD   3 Units at 01/24/19 1139  . insulin aspart (novoLOG) injection 0-5 Units  0-5 Units Subcutaneous QHS Suella Broad, FNP      . insulin aspart (novoLOG) injection 10 Units  10 Units Subcutaneous TID WC Clapacs, [name], MD   10 Units at 01/24/19 1141  .  insulin glargine (LANTUS) injection 33 Units  33 Units Subcutaneous Daily Clapacs, [name], MD   33 Units at 01/24/19 0746  . loperamide (IMODIUM) capsule 2 mg  2 mg Oral PRN Clapacs, [name], MD   2 mg at 01/24/19 0744  . magnesium hydroxide (MILK OF MAGNESIA)  suspension 30 mL  30 mL Oral Daily PRN Suella Broad, FNP      . QUEtiapine (SEROQUEL) tablet 50 mg  50 mg Oral Q4H PRN Clapacs, [name], MD   50 mg at 01/24/19 1422  . traZODone (DESYREL) tablet 100 mg  100 mg Oral QHS PRN Suella Broad, FNP   100 mg at 01/23/19 2124    Lab Results:  Results for orders placed or performed during the hospital encounter of 01/22/19 (from the past 48 hour(s))  Glucose, capillary     Status: Abnormal   Collection Time: 01/22/19  9:43 PM  Result Value Ref Range   Glucose-Capillary 177 (H) 70 - 99 mg/dL  Glucose, capillary     Status: Abnormal   Collection Time: 01/23/19  6:53 AM  Result Value Ref Range   Glucose-Capillary 303 (H) 70 - 99 mg/dL  Glucose, capillary     Status: Abnormal   Collection Time: 01/23/19  8:27 AM  Result Value Ref Range   Glucose-Capillary 410 (H) 70 - 99 mg/dL  Glucose, capillary     Status: Abnormal   Collection Time: 01/23/19 12:16 PM  Result Value Ref Range   Glucose-Capillary 445 (H) 70 - 99 mg/dL  Glucose, capillary     Status: Abnormal   Collection Time: 01/23/19  4:24 PM  Result Value Ref Range   Glucose-Capillary 176 (H) 70 - 99 mg/dL  Glucose, capillary     Status: None   Collection Time: 01/23/19  9:10 PM  Result Value Ref Range   Glucose-Capillary 71 70 - 99 mg/dL   Comment 1 Notify RN   Glucose, capillary     Status: Abnormal   Collection Time: 01/24/19  7:08 AM  Result Value Ref Range   Glucose-Capillary 145 (H) 70 - 99 mg/dL  Glucose, capillary     Status: Abnormal   Collection Time: 01/24/19 11:35 AM  Result Value Ref Range   Glucose-Capillary 165 (H) 70 - 99 mg/dL   Comment 1 Notify RN     Blood Alcohol level:  Lab Results   Component Value Date   ETH <10 01/21/2019   ETH <10 [id number]    Metabolic Disorder Labs: Lab Results  Component Value Date   HGBA1C 7.8 (H) 01/01/2019   MPG 177.16 01/01/2019   MPG 174.29 12/23/2018   No results found for: PROLACTIN Lab Results  Component Value Date   CHOL 161 09/15/2016   TRIG 190 (H) 09/15/2016   HDL 41 09/15/2016   CHOLHDL 3.9 09/15/2016   VLDL 38 09/15/2016   LDLCALC 82 09/15/2016    Physical Findings: AIMS:  , ,  ,  ,    CIWA:    COWS:     Musculoskeletal: Strength & Muscle Tone: within normal limits Gait & Station: normal Patient leans: N/A  Psychiatric Specialty Exam: Physical Exam  Nursing note and vitals reviewed. Constitutional: He appears well-developed and well-nourished.  HENT:  Head: Normocephalic and atraumatic.  Eyes: Pupils are equal, round, and reactive to light. Conjunctivae are normal.  Neck: Normal range of motion.  Cardiovascular: Regular rhythm and normal heart sounds.  Respiratory: Effort normal. No respiratory distress.  GI: Soft.  Musculoskeletal: Normal range of motion.  Neurological: He is alert.  Skin: Skin is warm and dry.  Psychiatric: His affect is blunt. His speech is delayed. He is not agitated and not aggressive. Thought content is not paranoid. Cognition and memory are normal. He expresses impulsivity and inappropriate judgment. He exhibits a depressed  mood. He expresses suicidal ideation. He expresses no homicidal ideation. He expresses no suicidal plans.    Review of Systems  Constitutional: Negative.   HENT: Negative.   Eyes: Negative.   Respiratory: Negative.   Cardiovascular: Negative.   Gastrointestinal: Negative.   Musculoskeletal: Negative.   Skin: Negative.   Neurological: Negative.   Psychiatric/Behavioral: Positive for depression and suicidal ideas.    Blood pressure 135/83, pulse 86, temperature 97.9 F (36.6 C), temperature source Oral, resp. rate 18, height 6' (1.829 m), weight 111 kg,  SpO2 100 %.Body mass index is 33.19 kg/m.  General Appearance: Disheveled  Eye Contact:  Fair  Speech:  Slow  Volume:  Decreased  Mood:  Depressed  Affect:  Depressed  Thought Process:  Coherent  Orientation:  Full (Time, Place, and Person)  Thought Content:  Obsessions, Rumination and Tangential  Suicidal Thoughts:  Yes.  without intent/plan  Homicidal Thoughts:  No  Memory:  Immediate;   Fair Recent;   Poor Remote;   Poor  Judgement:  Fair  Insight:  Fair  Psychomotor Activity:  Decreased  Concentration:  Concentration: Poor  Recall:  [employer] of Knowledge:  Fair  Language:  Fair  Akathisia:  No  Handed:  Right  AIMS (if indicated):     Assets:  Desire for Improvement Housing Resilience  ADL's:  Impaired  Cognition:  WNL  Sleep:  Number of Hours: 8.15     Treatment Plan Summary: Daily contact with patient to assess and evaluate symptoms and progress in treatment, Medication management and Plan This is a 58 year old man with severe depression.  Hopeless mood and thoughts.  Repeated suicide attempts.  Has not been responding to recent medication.  Review of medication shows that he had been on fairly high dose Luvox without lasting benefit.  We are trying switching him over to Celexa at this point.  Nevertheless the patient has failed medication management and represents an ongoing danger to himself.  I discussed electroconvulsive therapy with him.  Spoke with him about risks and benefits and described the procedure itself.  I had spoken earlier today with anesthesia and confirm that his tracheostomy would not be an insoluble barrier to ECT.  Patient is giving tentative consent to ECT starting on Monday.  ECT nurse will see him today and try to get consent signed.  Meanwhile not changing any current medicine.  Encourage him to stick with a carb controlled diet.  Alethia Berthold, MD 01/24/2019, 3:31 PM

## 2019-01-24 NOTE — Progress Notes (Signed)
Pt suctioned himself twice this afternoon. The first time was around 1400. Collier Bullock RN

## 2019-01-24 NOTE — Progress Notes (Signed)
Pt suctioned his trach twice since 12 noon. Collier Bullock RN

## 2019-01-24 NOTE — Progress Notes (Signed)
Recreation Therapy Notes   Date: 01/24/2019  Time: 1:00pm   Location: Outside  Behavioral response: Appropriate   Intervention Topic: Leisure  Discussion/Intervention:  Group content today was focused on leisure. The group defined what leisure is and some positive leisure activities they participate in. Individuals identified the difference between good and bad leisure. Participants expressed how they feel after participating in the leisure of their choice. The group discussed how they go about picking a leisure activity and if others are involved in their leisure activities. The patient stated how many leisure activities they too choose from and reasons why it is important to have leisure time. Individuals participated in the intervention "Exploration of Leisure" where they had a chance to identify new leisure activities as well as benefits of leisure.  Clinical Observations/Feedback:  Patient came to group late due to unknown reason. He explained that a leisure activity he likes to participate in is basketball. Individual was social with peers and staff while participating in the intervention. Patient left group early due to unknown reasons and never returned.  Damary Doland LRT/CTRS         [name] 01/24/2019 2:21 PM

## 2019-01-24 NOTE — Progress Notes (Addendum)
D - Patient was in his room upon arrival to the unit. Patient was pleasant during assessment. Patient denies SI/HI/AVH, pain. Patient endorses anxiety and depression, rating them 7/10. Patient said he was feeling better since he was back on his medications. Patient was observed interacting appropriately with staff and peers on the unit. Patient used suction machine at 2100. Patient stated that he talked to the MD about starting ECT next week  A - Patient compliant with medication administration per MD orders. Patient oriented to his room and the unit. Patient given education. Patient given support and encouragement to be active in his treatment plan. Patient informed to let staff know if there are any issues or problems on the unit.   R - Patient being monitored Q 15 minutes for safety per unit protocol. Patient remains safe on the unit.

## 2019-01-24 NOTE — B[facility] Group Notes (Signed)
LCSW Group Therapy Note  01/24/2019 10:00 AM  Type of Therapy and Topic:  Group Therapy:  Feelings around Relapse and Recovery  Participation Level:  Did Not Attend   Description of Group:    Patients in this group will discuss emotions they experience before and after a relapse. They will process how experiencing these feelings, or avoidance of experiencing them, relates to having a relapse. Facilitator will guide patients to explore emotions they have related to recovery. Patients will be encouraged to process which emotions are more powerful. They will be guided to discuss the emotional reaction significant others in their lives may have to their relapse or recovery. Patients will be assisted in exploring ways to respond to the emotions of others without this contributing to a relapse.  Therapeutic Goals: 1. Patient will identify two or more emotions that lead to a relapse for them 2. Patient will identify two emotions that result when they relapse 3. Patient will identify two emotions related to recovery 4. Patient will demonstrate ability to communicate their needs through discussion and/or role plays   Summary of Patient Progress: X  Therapeutic Modalities:   Cognitive Behavioral Therapy Solution-Focused Therapy Assertiveness Training Relapse Prevention Therapy   Kanyla Omeara, MSW, LCSW 01/24/2019 9:40 AM   

## 2019-01-24 NOTE — Plan of Care (Signed)
Patient stated he was really depressed today and said that he talked to the MD about starting ECT next week.   Problem: Education: Goal: Emotional status will improve Outcome: Not Progressing Goal: Mental status will improve Outcome: Not Progressing

## 2019-01-24 NOTE — Pre-Procedure Instructions (Signed)
Reviewed ECT procedure with the patient. He received the booklet and stated he had no questions. He stated Dr. Weber Cooks reviewed the process with him. The patient stated he felt informed about the procedure.

## 2019-01-24 NOTE — Plan of Care (Signed)
Pt rates depression 8/10 and anxiety 10/10. Pt denies SI, HI and AVH. Pt was educated on care plan and verbalizes understanding. Collier Bullock RN Problem: Education: Goal: Freight [employer] Education information/materials will improve Outcome: Progressing Goal: Mental status will improve Outcome: Progressing Goal: Verbalization of understanding the information provided will improve Outcome: Progressing   Problem: Safety: Goal: Periods of time without injury will increase Outcome: Progressing   Problem: Education: Goal: Ability to make informed decisions regarding treatment will improve Outcome: Progressing   Problem: Medication: Goal: Compliance with prescribed medication regimen will improve Outcome: Progressing

## 2019-01-25 LAB — GLUCOSE, CAPILLARY
Glucose-Capillary: 142 mg/dL — ABNORMAL HIGH (ref 70–99)
Glucose-Capillary: 114 mg/dL — ABNORMAL HIGH (ref 70–99)
Glucose-Capillary: 212 mg/dL — ABNORMAL HIGH (ref 70–99)
Glucose-Capillary: 68 mg/dL — ABNORMAL LOW (ref 70–99)

## 2019-01-25 MED ORDER — LIOTHYRONINE SODIUM 5 MCG PO TABS
10.0000 ug | ORAL_TABLET | Freq: Every day | ORAL | Status: DC
  Administered 2019-01-25 – 2019-01-29 (×5): 10 ug via ORAL
  Filled 2019-01-25 (×5): qty 2

## 2019-01-25 MED ORDER — BUPROPION HCL ER (XL) 150 MG PO TB24
150.0000 mg | ORAL_TABLET | Freq: Every day | ORAL | Status: DC
  Administered 2019-01-25 – 2019-01-29 (×5): 150 mg via ORAL
  Filled 2019-01-25 (×5): qty 1

## 2019-01-25 NOTE — Plan of Care (Signed)
Patient aware of [place] Education and unit programing , able to verbalize  indication . Emotional and mental status improving . Patient  able to manage   frustration, maintaining  control .  Aware of medication  given . Voice of no safety concerns .  Working on coping and decision making .Appetite good at meals . Interacting with peers  appropriately . Compliant  with medications.    Problem: Safety: Goal: Periods of time without injury will increase Outcome: Progressing   Problem: Education: Goal: Ability to make informed decisions regarding treatment will improve Outcome: Progressing   Problem: Medication: Goal: Compliance with prescribed medication regimen will improve Outcome: Progressing   Problem: Education: Goal: Knowledge of [facility] Education information/materials will improve Outcome: Progressing Goal: Emotional status will improve Outcome: Progressing Goal: Mental status will improve Outcome: Progressing Goal: Verbalization of understanding the information provided will improve Outcome: Progressing

## 2019-01-25 NOTE — B[facility] Group Notes (Signed)
LCSW Aftercare Discharge Planning Group Note   01/25/2019 1315  Type of Group and Topic: Psychoeducational Group:  Discharge Planning  Participation Level:  Did Not Attend  Description of Group  Discharge planning group reviews patient's anticipated discharge plans and assists patients to anticipate and address any barriers to wellness/recovery in the community.  Suicide prevention education is reviewed with patients in group.  Therapeutic Goals 1. Patients will state their anticipated discharge plan and mental health aftercare 2. Patients will identify potential barriers to wellness in the community setting 3. Patients will engage in problem solving, solution focused discussion of ways to anticipate and address barriers to wellness/recovery  Summary of Patient Progress   Plan for Discharge/Comments:    Transportation Means:   Supports:  Therapeutic Modalities: Motivational Interviewing    Joanne Chars, [place] 01/25/2019 2:01 PM

## 2019-01-25 NOTE — Progress Notes (Signed)
[facility] MD Progress Note  01/25/2019 9:28 AM [name]  MRN:  [id number] Subjective:   Patient continues to report no progress continued suicidal thoughts without intent to harm himself here continued depression.  States he has no explanation otherwise.  Has medical comorbidities of diabetes and hypertension as well as hypothyroidism but they are all being addressed.  Alert and oriented to person place situation affect constricted can contract here continues to endorse depression.  Talk to him about the benefits of ECT he will consider.  Principal Problem: MDD (major depressive disorder), recurrent episode, severe ([place]) Diagnosis: Principal Problem:   MDD (major depressive disorder), recurrent episode, severe (HCC) Active Problems:   Hypothyroidism   HTN (hypertension)   GERD (gastroesophageal reflux disease)   Type 1 diabetes mellitus ([name])   Tracheostomy in place [name])   Obsessive compulsive disorder  Total Time spent with patient: 20 minutes  Past Psychiatric History: Treatment resistant depression/obsessive-compulsive disorder  Past Medical History:  Past Medical History:  Diagnosis Date  . Depression   . Diabetes (HCC)    Insulin Pump  . Diabetes mellitus type I ([name])   . Diabetes mellitus without complication ([name])   . GERD (gastroesophageal reflux disease)   . H/O laryngectomy   . Heel bone fracture   . Hyperlipidemia   . Hypertension   . Radicular pain of right lower extremity   . Stroke ([name])   . Suicide attempt [name]) 2014   damaged larynx - tracheostomy  . Thyroid disease     Past Surgical History:  Procedure Laterality Date  . COLONOSCOPY WITH PROPOFOL N/A 05/15/2018   Procedure: COLONOSCOPY WITH PROPOFOL;  Surgeon: [name], [name], MD;  Location: [facility] ENDOSCOPY;  Service: Gastroenterology;  Laterality: N/A;  . ESOPHAGOGASTRODUODENOSCOPY N/A 05/15/2018   Procedure: ESOPHAGOGASTRODUODENOSCOPY (EGD);  Surgeon: [name], [name], MD;  Location: [facility]  ENDOSCOPY;  Service: Gastroenterology;  Laterality: N/A;  . FRACTURE SURGERY    . Heel bone reconstruction Left   . HERNIA REPAIR  [id number]   Umbilical hernia repair   . LARYNGECTOMY    . NECK SURGERY     fusion  . SPINE SURGERY    . TRACHEOSTOMY  2014   from SI attempt   Family History:  Family History  Problem Relation Age of Onset  . Osteoporosis Mother   . Diabetes Mother   . Hypertension Father   . Mental illness Neg Hx    Family Psychiatric  History: No new data Social History:  Social History   Substance and Sexual Activity  Alcohol Use Yes  . Alcohol/week: 2.0 standard drinks  . Types: 2 Shots of liquor per week   Comment: rare     Social History   Substance and Sexual Activity  Drug Use No   Comment: Pt denied; UDS not available    Social History   Socioeconomic History  . Marital status: Married    Spouse name: Not on file  . Number of children: Not on file  . Years of education: Not on file  . Highest education level: Not on file  Occupational History  . Not on file  Social Needs  . Financial resource strain: Not on file  . Food insecurity    Worry: Not on file    Inability: Not on file  . Transportation needs    Medical: Not on file    Non-medical: Not on file  Tobacco Use  . Smoking status: Former Smoker    Packs/day: 0.00  Types: Cigarettes    Quit date: 04/09/2013    Years since quitting: 5.8  . Smokeless tobacco: Never Used  Substance and Sexual Activity  . Alcohol use: Yes    Alcohol/week: 2.0 standard drinks    Types: 2 Shots of liquor per week    Comment: rare  . Drug use: No    Comment: Pt denied; UDS not available  . Sexual activity: Yes    Partners: Female    Birth control/protection: Condom  Lifestyle  . Physical activity    Days per week: Not on file    Minutes per session: Not on file  . Stress: Not on file  Relationships  . Social Herbalist on phone: Not on file    Gets together: Not on file     Attends religious service: Not on file    Active member of club or organization: Not on file    Attends meetings of clubs or organizations: Not on file    Relationship status: Not on file  Other Topics Concern  . Not on file  Social History Narrative  . Not on file   Additional Social History:                         Sleep: Good  Appetite:  Good  Current Medications: Current Facility-Administered Medications  Medication Dose Route Frequency Provider Last Rate Last Dose  . acetaminophen (TYLENOL) tablet 650 mg  650 mg Oral Q6H PRN Suella Broad, FNP      . albuterol (PROVENTIL) (2.5 MG/3ML) 0.083% nebulizer solution 2.5 mg  2.5 mg Nebulization TID PRN Burt Ek Gayland Curry, FNP      . alum & mag hydroxide-simeth (MAALOX/MYLANTA) 200-200-20 MG/5ML suspension 30 mL  30 mL Oral Q4H PRN Suella Broad, FNP   30 mL at 01/24/19 1955  . amLODipine (NORVASC) tablet 5 mg  5 mg Oral Daily Suella Broad, FNP   5 mg at 01/25/19 0734  . aspirin EC tablet 81 mg  81 mg Oral Daily Suella Broad, FNP   81 mg at 01/25/19 0734  . citalopram (CELEXA) tablet 40 mg  40 mg Oral Daily Clapacs, [name], MD   40 mg at 01/25/19 0735  . hydrOXYzine (ATARAX/VISTARIL) tablet 25 mg  25 mg Oral TID PRN Suella Broad, FNP   25 mg at 01/24/19 2111  . insulin aspart (novoLOG) injection 0-15 Units  0-15 Units Subcutaneous TID WC Clapacs, [name], MD   2 Units at 01/25/19 0739  . insulin aspart (novoLOG) injection 0-5 Units  0-5 Units Subcutaneous QHS Suella Broad, FNP      . insulin aspart (novoLOG) injection 10 Units  10 Units Subcutaneous TID WC Clapacs, [name], MD   10 Units at 01/25/19 0737  . insulin glargine (LANTUS) injection 33 Units  33 Units Subcutaneous Daily Clapacs, [name], MD   33 Units at 01/25/19 [phone]  . loperamide (IMODIUM) capsule 2 mg  2 mg Oral PRN Clapacs, [name], MD   2 mg at 01/24/19 0744  . magnesium hydroxide (MILK OF MAGNESIA)  suspension 30 mL  30 mL Oral Daily PRN Suella Broad, FNP      . QUEtiapine (SEROQUEL) tablet 50 mg  50 mg Oral Q4H PRN Clapacs, [name], MD   50 mg at 01/24/19 1832  . traZODone (DESYREL) tablet 100 mg  100 mg Oral QHS PRN Burt Ek, Gayland Curry, FNP  100 mg at 01/24/19 2111    Lab Results:  Results for orders placed or performed during the hospital encounter of 01/22/19 (from the past 48 hour(s))  Glucose, capillary     Status: Abnormal   Collection Time: 01/23/19 12:16 PM  Result Value Ref Range   Glucose-Capillary 445 (H) 70 - 99 mg/dL  Glucose, capillary     Status: Abnormal   Collection Time: 01/23/19  4:24 PM  Result Value Ref Range   Glucose-Capillary 176 (H) 70 - 99 mg/dL  Glucose, capillary     Status: None   Collection Time: 01/23/19  9:10 PM  Result Value Ref Range   Glucose-Capillary 71 70 - 99 mg/dL   Comment 1 Notify RN   Glucose, capillary     Status: Abnormal   Collection Time: 01/24/19  7:08 AM  Result Value Ref Range   Glucose-Capillary 145 (H) 70 - 99 mg/dL  Glucose, capillary     Status: Abnormal   Collection Time: 01/24/19 11:35 AM  Result Value Ref Range   Glucose-Capillary 165 (H) 70 - 99 mg/dL   Comment 1 Notify RN   Glucose, capillary     Status: Abnormal   Collection Time: 01/24/19  4:11 PM  Result Value Ref Range   Glucose-Capillary 149 (H) 70 - 99 mg/dL  Glucose, capillary     Status: Abnormal   Collection Time: 01/24/19  8:52 PM  Result Value Ref Range   Glucose-Capillary 116 (H) 70 - 99 mg/dL   Comment 1 Notify RN   Glucose, capillary     Status: Abnormal   Collection Time: 01/25/19  7:00 AM  Result Value Ref Range   Glucose-Capillary 142 (H) 70 - 99 mg/dL   Comment 1 Notify RN     Blood Alcohol level:  Lab Results  Component Value Date   ETH <10 01/21/2019   ETH <10 [id number]    Metabolic Disorder Labs: Lab Results  Component Value Date   HGBA1C 7.8 (H) 01/01/2019   MPG 177.16 01/01/2019   MPG 174.29 12/23/2018   No  results found for: PROLACTIN Lab Results  Component Value Date   CHOL 161 09/15/2016   TRIG 190 (H) 09/15/2016   HDL 41 09/15/2016   CHOLHDL 3.9 09/15/2016   VLDL 38 09/15/2016   LDLCALC 82 09/15/2016    Physical Findings: AIMS:  , ,  ,  ,    CIWA:    COWS:     Musculoskeletal: Strength & Muscle Tone: within normal limits Gait & Station: normal Patient leans: N/A  Psychiatric Specialty Exam: Physical Exam  ROS  Blood pressure (!) 158/84, pulse 72, temperature 98.4 F (36.9 C), temperature source Oral, resp. rate 18, height 6' (1.829 m), weight 111 kg, SpO2 97 %.Body mass index is 33.19 kg/m.  General Appearance: Casual  Eye Contact:  Good  Speech:  Generally intelligible despite trach  Volume:  Normal  Mood:  Depressed  Affect:  Blunt  Thought Process:  Coherent and Descriptions of Associations: Intact  Orientation:  Full (Time, Place, and Person)  Thought Content:  Obsessions  Suicidal Thoughts:  Yes.  without intent/plan  Homicidal Thoughts:  No  Memory:  Immediate;   Fair Recent;   Fair Remote;   Fair  Judgement:  Fair  Insight:  Good  Psychomotor Activity:  Normal  Concentration:  Concentration: Good and Attention Span: Good  Recall:  Good  Fund of Knowledge:  Good  Language:  Fair  Akathisia:  Negative  Handed:  Right  AIMS (if indicated):     Assets:  Physical Health Resilience  ADL's:  Intact  Cognition:  WNL  Sleep:  Number of Hours: 6.5     Treatment Plan Summary: Daily contact with patient to assess and evaluate symptoms and progress in treatment and Medication management  Continue cognitive-based therapy continue to consider ECT.  Continue current precautions all for depression also augmentation strategies include Cytomel as well as bupropion no other changes in meds.  Continue citalopram  Johnn Hai, MD 01/25/2019, 9:28 AM

## 2019-01-25 NOTE — Progress Notes (Addendum)
D: Patient stated slept fair last night .Stated appetite fair and energy level low. Stated concentration poor . Stated on Depression scale 7 , hopeless 7 and anxiety .( low 0-10 high) Denies suicidal  homicidal ideations  .  No auditory hallucinations   . Appropriate ADL'S. Limited Interacting with peers and staff.   Patient aware of [place] [facility] and unit programing , able to verbalize  indication . Emotional and mental status improving . Patient  able to manage   frustration, maintaining  control .  Aware of medication  given . Voice of no safety concerns .  Working on coping and decision making . Compliant  with medications.   Patient suctioned  Himself this am shift . Noted to bring up small amount  Of  food from breakfast .  Patient seen by DR. Jake Samples  This am shift . Patient returned to bed . Patient refused to  Get out of bed for medications .  A: Encourage patient participation with unit programming . Instruction  Given on  Medication , verbalize understanding.  R: Voice no other concerns. Staff continue to monitor

## 2019-01-25 NOTE — Progress Notes (Signed)
D: Patient  Refused  Finger stick  At lunch  Remained in bed  Writer later went in to talk with patient why he was not eating  Or  nonCompliant with medication . Patient stated he  Was very anxious  And stomach was upset. Stated he needed Seroquel  Stated he could have it every 4 hours . While patient was  Talking   Stated he needed to  Suction himself A:Noted to heavy breath  spit, and splaltter  mucous . R: staff continue to monitor  Behavior .

## 2019-01-26 LAB — GLUCOSE, CAPILLARY
Glucose-Capillary: 183 mg/dL — ABNORMAL HIGH (ref 70–99)
Glucose-Capillary: 149 mg/dL — ABNORMAL HIGH (ref 70–99)
Glucose-Capillary: 102 mg/dL — ABNORMAL HIGH (ref 70–99)
Glucose-Capillary: 79 mg/dL (ref 70–99)

## 2019-01-26 NOTE — Progress Notes (Signed)
Patient alert and oriented x 4, affect is flat but he brightens upon approach, he has a tracheostomy intact, airway is patent , no distress noted lung sounds clear to ascultation, symmetrical lung expansion, his thoughts are organized , he appears less anxious, interacting appropriately with peers and staff, complaint with medication regimen, 15 minutes safety checks maintained will continue to monitor.

## 2019-01-26 NOTE — Plan of Care (Signed)
Pt rates depression 8/10 and anxiety 10/10. Pt denies SI, HI and AVH. Pt was educated on care plan and verbalizes understanding. Collier Bullock RN Problem: Education: Goal: Knowledge of [place] [facility] Education information/materials will improve Outcome: Progressing Goal: Emotional status will improve Outcome: Progressing Goal: Mental status will improve Outcome: Progressing Goal: Verbalization of understanding the information provided will improve Outcome: Progressing   Problem: Safety: Goal: Periods of time without injury will increase Outcome: Progressing   Problem: Education: Goal: Ability to make informed decisions regarding treatment will improve Outcome: Progressing   Problem: Medication: Goal: Compliance with prescribed medication regimen will improve Outcome: Progressing

## 2019-01-26 NOTE — B[facility] Group Notes (Signed)
LCSW Group Therapy Note 01/26/2019 1:15pm  Type of Therapy and Topic: Group Therapy: Feelings Around Returning Home & Establishing a Supportive Framework and Supporting Oneself When Supports Not Available  Participation Level: Did Not Attend  Description of Group:  Patients first processed thoughts and feelings about upcoming discharge. These included fears of upcoming changes, lack of change, new living environments, judgements and expectations from others and overall stigma of mental health issues. The group then discussed the definition of a supportive framework, what that looks and feels like, and how do to discern it from an unhealthy non-supportive network. The group identified different types of supports as well as what to do when your family/friends are less than helpful or unavailable  Therapeutic Goals  1. Patient will identify one healthy supportive network that they can use at discharge. 2. Patient will identify one factor of a supportive framework and how to tell it from an unhealthy network. 3. Patient able to identify one coping skill to use when they do not have positive supports from others. 4. Patient will demonstrate ability to communicate their needs through discussion and/or role plays.  Summary of Patient Progress:  Pt was invited to attend group but chose not to attend. CSW will continue to encourage pt to attend group throughout their admission.   Therapeutic Modalities Cognitive Behavioral Therapy Motivational Interviewing   Tyler Robidoux  CUEBAS-COLON, LCSW 01/26/2019 10:35 AM

## 2019-01-26 NOTE — Progress Notes (Signed)
[facility] MD Progress Note  01/26/2019 10:03 AM Grey Turknett  MRN:  [id number] Subjective:   Patient seen in bed he does not really speak much to me today he denies thoughts of harming himself today but states they come and go.  He initially refused the Cytomel and Wellbutrin, I explained to him what these augmentation strategies were for he eventually took them later in the morning yesterday and took them today.  I explained that Cytomel may help with depression as there are only T3 receptors in the brain T4 receptors, and explained to him that combination of anti-depressants often involves Wellbutrin due to its safety and lack of drug interactions  Does not really engage much in cognitive therapy today somewhat passive-aggressive today Principal Problem: MDD (major depressive disorder), recurrent episode, severe ([place]) Diagnosis: Principal Problem:   MDD (major depressive disorder), recurrent episode, severe ([place]) Active Problems:   Hypothyroidism   HTN (hypertension)   GERD (gastroesophageal reflux disease)   Type 1 diabetes mellitus ([place])   Tracheostomy in place ([place])   Obsessive compulsive disorder  Total Time spent with patient: 20 minutes  Past Psychiatric History: Reports depression  Past Medical History:  Past Medical History:  Diagnosis Date  . Depression   . Diabetes (HCC)    Insulin Pump  . Diabetes mellitus type I ([place])   . Diabetes mellitus without complication ([place])   . GERD (gastroesophageal reflux disease)   . H/O laryngectomy   . Heel bone fracture   . Hyperlipidemia   . Hypertension   . Radicular pain of right lower extremity   . Stroke ([place])   . Suicide attempt [facility]) 2014   damaged larynx - tracheostomy  . Thyroid disease     Past Surgical History:  Procedure Laterality Date  . COLONOSCOPY WITH PROPOFOL N/A 05/15/2018   Procedure: COLONOSCOPY WITH PROPOFOL;  Surgeon: [name], [name], MD;  Location: [facility] ENDOSCOPY;  Service: Gastroenterology;   Laterality: N/A;  . ESOPHAGOGASTRODUODENOSCOPY N/A 05/15/2018   Procedure: ESOPHAGOGASTRODUODENOSCOPY (EGD);  Surgeon: [name], [name], MD;  Location: [facility] ENDOSCOPY;  Service: Gastroenterology;  Laterality: N/A;  . FRACTURE SURGERY    . Heel bone reconstruction Left   . HERNIA REPAIR  [id number]   Umbilical hernia repair   . LARYNGECTOMY    . NECK SURGERY     fusion  . SPINE SURGERY    . TRACHEOSTOMY  2014   from SI attempt   Family History:  Family History  Problem Relation Age of Onset  . Osteoporosis Mother   . Diabetes Mother   . Hypertension Father   . Mental illness Neg Hx    Family Psychiatric  History: No new data reported Social History:  Social History   Substance and Sexual Activity  Alcohol Use Yes  . Alcohol/week: 2.0 standard drinks  . Types: 2 Shots of liquor per week   Comment: rare     Social History   Substance and Sexual Activity  Drug Use No   Comment: Pt denied; UDS not available    Social History   Socioeconomic History  . Marital status: Married    Spouse name: Not on file  . Number of children: Not on file  . Years of education: Not on file  . Highest education level: Not on file  Occupational History  . Not on file  Social Needs  . Financial resource strain: Not on file  . Food insecurity    Worry: Not on file    Inability: Not  on file  . Transportation needs    Medical: Not on file    Non-medical: Not on file  Tobacco Use  . Smoking status: Former Smoker    Packs/day: 0.00    Types: Cigarettes    Quit date: 04/09/2013    Years since quitting: 5.8  . Smokeless tobacco: Never Used  Substance and Sexual Activity  . Alcohol use: Yes    Alcohol/week: 2.0 standard drinks    Types: 2 Shots of liquor per week    Comment: rare  . Drug use: No    Comment: Pt denied; UDS not available  . Sexual activity: Yes    Partners: Female    Birth control/protection: Condom  Lifestyle  . Physical activity    Days per week: Not on file     Minutes per session: Not on file  . Stress: Not on file  Relationships  . Social Herbalist on phone: Not on file    Gets together: Not on file    Attends religious service: Not on file    Active member of club or organization: Not on file    Attends meetings of clubs or organizations: Not on file    Relationship status: Not on file  Other Topics Concern  . Not on file  Social History Narrative  . Not on file   Additional Social History:                         Sleep: Good  Appetite:  Good  Current Medications: Current Facility-Administered Medications  Medication Dose Route Frequency Provider Last Rate Last Dose  . acetaminophen (TYLENOL) tablet 650 mg  650 mg Oral Q6H PRN Suella Broad, FNP      . albuterol (PROVENTIL) (2.5 MG/3ML) 0.083% nebulizer solution 2.5 mg  2.5 mg Nebulization TID PRN Burt Ek Gayland Curry, FNP      . alum & mag hydroxide-simeth (MAALOX/MYLANTA) 200-200-20 MG/5ML suspension 30 mL  30 mL Oral Q4H PRN Suella Broad, FNP   30 mL at 01/24/19 1955  . amLODipine (NORVASC) tablet 5 mg  5 mg Oral Daily Suella Broad, FNP   5 mg at 01/26/19 0755  . aspirin EC tablet 81 mg  81 mg Oral Daily Suella Broad, FNP   81 mg at 01/26/19 0755  . buPROPion (WELLBUTRIN XL) 24 hr tablet 150 mg  150 mg Oral Daily Johnn Hai, MD   150 mg at 01/26/19 0755  . citalopram (CELEXA) tablet 40 mg  40 mg Oral Daily Clapacs, [name], MD   40 mg at 01/26/19 0755  . hydrOXYzine (ATARAX/VISTARIL) tablet 25 mg  25 mg Oral TID PRN Suella Broad, FNP   25 mg at 01/24/19 2111  . insulin aspart (novoLOG) injection 0-15 Units  0-15 Units Subcutaneous TID WC Clapacs, [name], MD   3 Units at 01/26/19 0750  . insulin aspart (novoLOG) injection 0-5 Units  0-5 Units Subcutaneous QHS Suella Broad, FNP      . insulin aspart (novoLOG) injection 10 Units  10 Units Subcutaneous TID WC Clapacs, [name], MD   10 Units at 01/26/19  0751  . insulin glargine (LANTUS) injection 33 Units  33 Units Subcutaneous Daily Clapacs, [name], MD   33 Units at 01/26/19 [phone]  . liothyronine (CYTOMEL) tablet 10 mcg  10 mcg Oral Daily Johnn Hai, MD   10 mcg at 01/26/19 0756  . loperamide (IMODIUM) capsule  2 mg  2 mg Oral PRN Clapacs, [name], MD   2 mg at 01/24/19 0744  . magnesium hydroxide (MILK OF MAGNESIA) suspension 30 mL  30 mL Oral Daily PRN Suella Broad, FNP      . QUEtiapine (SEROQUEL) tablet 50 mg  50 mg Oral Q4H PRN Clapacs, [name], MD   50 mg at 01/26/19 0756  . traZODone (DESYREL) tablet 100 mg  100 mg Oral QHS PRN Suella Broad, FNP   100 mg at 01/25/19 2122    Lab Results:  Results for orders placed or performed during the hospital encounter of 01/22/19 (from the past 48 hour(s))  Glucose, capillary     Status: Abnormal   Collection Time: 01/24/19 11:35 AM  Result Value Ref Range   Glucose-Capillary 165 (H) 70 - 99 mg/dL   Comment 1 Notify RN   Glucose, capillary     Status: Abnormal   Collection Time: 01/24/19  4:11 PM  Result Value Ref Range   Glucose-Capillary 149 (H) 70 - 99 mg/dL  Glucose, capillary     Status: Abnormal   Collection Time: 01/24/19  8:52 PM  Result Value Ref Range   Glucose-Capillary 116 (H) 70 - 99 mg/dL   Comment 1 Notify RN   Glucose, capillary     Status: Abnormal   Collection Time: 01/25/19  7:00 AM  Result Value Ref Range   Glucose-Capillary 142 (H) 70 - 99 mg/dL   Comment 1 Notify RN   Glucose, capillary     Status: Abnormal   Collection Time: 01/25/19 12:09 PM  Result Value Ref Range   Glucose-Capillary 114 (H) 70 - 99 mg/dL  Glucose, capillary     Status: Abnormal   Collection Time: 01/25/19  4:05 PM  Result Value Ref Range   Glucose-Capillary 212 (H) 70 - 99 mg/dL   Comment 1 Document in Chart   Glucose, capillary     Status: Abnormal   Collection Time: 01/25/19  8:56 PM  Result Value Ref Range   Glucose-Capillary 68 (L) 70 - 99 mg/dL   Comment 1 Notify  RN   Glucose, capillary     Status: Abnormal   Collection Time: 01/26/19  7:08 AM  Result Value Ref Range   Glucose-Capillary 183 (H) 70 - 99 mg/dL   Comment 1 Notify RN     Blood Alcohol level:  Lab Results  Component Value Date   ETH <10 01/21/2019   ETH <10 [id number]    Metabolic Disorder Labs: Lab Results  Component Value Date   HGBA1C 7.8 (H) 01/01/2019   MPG 177.16 01/01/2019   MPG 174.29 12/23/2018   No results found for: PROLACTIN Lab Results  Component Value Date   CHOL 161 09/15/2016   TRIG 190 (H) 09/15/2016   HDL 41 09/15/2016   CHOLHDL 3.9 09/15/2016   VLDL 38 09/15/2016   LDLCALC 82 09/15/2016    Physical Findings: AIMS:  , ,  ,  ,    CIWA:    COWS:     Musculoskeletal: Strength & Muscle Tone: within normal limits Gait & Station: normal Patient leans: N/A  Psychiatric Specialty Exam: Physical Exam  ROS  Blood pressure 128/84, pulse 77, temperature 98.2 F (36.8 C), temperature source Oral, resp. rate 18, height 6' (1.829 m), weight 111 kg, SpO2 100 %.Body mass index is 33.19 kg/m.  General Appearance: Casual  Eye Contact:  Fair  Speech:  Clear and Coherent  Volume:  Normal  Mood:  Depressed  Affect:  Congruent  Thought Process:  Coherent, Linear and Descriptions of Associations: Intact  Orientation:  Full (Time, Place, and Person)  Thought Content:  Logical  Suicidal Thoughts:  No but reports recent suicidal thoughts without plans or intent  Homicidal Thoughts:  No  Memory:  Immediate;   Fair Recent;   Fair Remote;   Fair  Judgement:  Fair  Insight:  Fair  Psychomotor Activity:  Normal  Concentration:  Concentration: Fair and Attention Span: Fair  Recall:  [employer] of Knowledge:  Fair  Language:  Fair  Akathisia:  Negative  Handed:  Right  AIMS (if indicated):     Assets:  Leisure Time Physical Health Resilience  ADL's:  Intact  Cognition:  WNL  Sleep:  Number of Hours: 8.15     Treatment Plan Summary: Daily contact  with patient to assess and evaluate symptoms and progress in treatment and Medication management  No change in precautions continue antidepressants and augmentation strategies continue cognitive therapy continue 15-minute checks  Shaylynn Nulty, MD 01/26/2019, 10:03 AM

## 2019-01-26 NOTE — Progress Notes (Signed)
Pt expressed having anxiety today. He suctioned in the morning and afternoon. Collier Bullock RN

## 2019-01-27 ENCOUNTER — Inpatient Hospital Stay: Payer: PPO | Admitting: Anesthesiology

## 2019-01-27 DIAGNOSIS — I10 Essential (primary) hypertension: Secondary | ICD-10-CM | POA: Diagnosis not present

## 2019-01-27 DIAGNOSIS — N179 Acute kidney failure, unspecified: Secondary | ICD-10-CM | POA: Diagnosis not present

## 2019-01-27 DIAGNOSIS — E785 Hyperlipidemia, unspecified: Secondary | ICD-10-CM | POA: Diagnosis not present

## 2019-01-27 DIAGNOSIS — E119 Type 2 diabetes mellitus without complications: Secondary | ICD-10-CM | POA: Diagnosis not present

## 2019-01-27 DIAGNOSIS — K219 Gastro-esophageal reflux disease without esophagitis: Secondary | ICD-10-CM | POA: Diagnosis not present

## 2019-01-27 DIAGNOSIS — F418 Other specified anxiety disorders: Secondary | ICD-10-CM | POA: Diagnosis not present

## 2019-01-27 LAB — GLUCOSE, CAPILLARY
Glucose-Capillary: 173 mg/dL — ABNORMAL HIGH (ref 70–99)
Glucose-Capillary: 258 mg/dL — ABNORMAL HIGH (ref 70–99)
Glucose-Capillary: 231 mg/dL — ABNORMAL HIGH (ref 70–99)
Glucose-Capillary: 243 mg/dL — ABNORMAL HIGH (ref 70–99)

## 2019-01-27 MED ORDER — METHOHEXITAL SODIUM 100 MG/10ML IV SOSY
PREFILLED_SYRINGE | INTRAVENOUS | Status: DC | PRN
  Administered 2019-01-27: 110 mg via INTRAVENOUS

## 2019-01-27 MED ORDER — QUETIAPINE FUMARATE 25 MG PO TABS
25.0000 mg | ORAL_TABLET | ORAL | Status: DC | PRN
  Administered 2019-01-27 – 2019-01-28 (×2): 25 mg via ORAL
  Filled 2019-01-27 (×2): qty 1

## 2019-01-27 MED ORDER — SUCCINYLCHOLINE CHLORIDE 20 MG/ML IJ SOLN
INTRAMUSCULAR | Status: AC
  Filled 2019-01-27: qty 1

## 2019-01-27 MED ORDER — SODIUM CHLORIDE 0.9 % IV SOLN
500.0000 mL | Freq: Once | INTRAVENOUS | Status: AC
  Administered 2019-01-27: 11:00:00 via INTRAVENOUS

## 2019-01-27 MED ORDER — SUCCINYLCHOLINE CHLORIDE 200 MG/10ML IV SOSY
PREFILLED_SYRINGE | INTRAVENOUS | Status: DC | PRN
  Administered 2019-01-27: 120 mg via INTRAVENOUS

## 2019-01-27 MED ORDER — LABETALOL HCL 5 MG/ML IV SOLN
INTRAVENOUS | Status: AC
  Filled 2019-01-27: qty 4

## 2019-01-27 MED ORDER — LABETALOL HCL 5 MG/ML IV SOLN
INTRAVENOUS | Status: DC | PRN
  Administered 2019-01-27 (×2): 10 mg via INTRAVENOUS

## 2019-01-27 MED ORDER — QUETIAPINE FUMARATE 25 MG PO TABS
50.0000 mg | ORAL_TABLET | Freq: Every day | ORAL | Status: DC
  Administered 2019-01-27 – 2019-01-28 (×2): 50 mg via ORAL
  Filled 2019-01-27 (×2): qty 2

## 2019-01-27 NOTE — [facility] Group Notes (Signed)
LCSW Group Therapy Note   01/27/2019 12:53 PM   Type of Therapy and Topic:  Group Therapy:  Overcoming Obstacles   Participation Level:  Minimal   Description of Group:    In this group patients will be encouraged to explore what they see as obstacles to their own wellness and recovery. They will be guided to discuss their thoughts, feelings, and behaviors related to these obstacles. The group will process together ways to cope with barriers, with attention given to specific choices patients can make. Each patient will be challenged to identify changes they are motivated to make in order to overcome their obstacles. This group will be process-oriented, with patients participating in exploration of their own experiences as well as giving and receiving support and challenge from other group members.   Therapeutic Goals: 1. Patient will identify personal and current obstacles as they relate to admission. 2. Patient will identify barriers that currently interfere with their wellness or overcoming obstacles.  3. Patient will identify feelings, thought process and behaviors related to these barriers. 4. Patient will identify two changes they are willing to make to overcome these obstacles:      Summary of Patient Progress Pt came to group toward the end and identified fishing as a coping skill. Pt did not engage in much of the group discussion.      Therapeutic Modalities:   Cognitive Behavioral Therapy Solution Focused Therapy Motivational Interviewing Relapse Prevention Therapy  Evalina Field, MSW, LCSW Clinical Social Work 01/27/2019 12:53 PM

## 2019-01-27 NOTE — Progress Notes (Signed)
Report was received from [facility] on patient after receiving ECT treatment and it was stated that patient had a difficult time during his procedure, but was doing well at this time. Patient is alert and oriented and will be transported back to the unit in a few minutes.

## 2019-01-27 NOTE — Plan of Care (Signed)
D- Patient alert and oriented. Patient presents in a pleasant mood on assessment stating that he slept "so-so" last night, but did not go into detail as to why he didn't' sleep well. Patient had no major complaints to voice to this Probation officer. Patient denies any depression, however, he reports that he has "anxiety all the time. I'm kind of scared of getting ECT". Patient also denies SI, HI, AVH, and pain at this time. Patient's goal for today is "to go home", in which he will "stay positive" in order to achieve his goal.  A- Scheduled medications administered to patient, per MD orders. Support and encouragement provided.  Routine safety checks conducted every 15 minutes.  Patient informed to notify staff with problems or concerns.  R- No adverse drug reactions noted. Patient contracts for safety at this time. Patient compliant with medications and treatment plan. Patient receptive, calm, and cooperative. Patient interacts well with others on the unit.  Patient remains safe at this time.  Problem: Education: Goal: Knowledge of [facility] Education information/materials will improve Outcome: Progressing Goal: Emotional status will improve Outcome: Progressing Goal: Mental status will improve Outcome: Progressing Goal: Verbalization of understanding the information provided will improve Outcome: Progressing   Problem: Safety: Goal: Periods of time without injury will increase Outcome: Progressing   Problem: Education: Goal: Ability to make informed decisions regarding treatment will improve Outcome: Progressing   Problem: Medication: Goal: Compliance with prescribed medication regimen will improve Outcome: Progressing

## 2019-01-27 NOTE — Plan of Care (Signed)
Patient said his anxiety and depression were a lot better.   Problem: Education: Goal: Emotional status will improve Outcome: Progressing Goal: Mental status will improve Outcome: Progressing

## 2019-01-27 NOTE — Plan of Care (Signed)
  Problem: Education: Goal: Knowledge of [facility] Education information/materials will improve Outcome: Progressing Goal: Emotional status will improve Outcome: Progressing Goal: Mental status will improve Outcome: Progressing Goal: Verbalization of understanding the information provided will improve Outcome: Progressing  D: Patient has been asking for medication for anxiety and panic attacks, but patient has been calm, cooperative, pleasant and socializing with peers on the unit. Mood is pleasant. Affect is appropriate to circumstance. A: Continue to monitor for safety R: Safety maintained.

## 2019-01-27 NOTE — Progress Notes (Signed)
[facility] MD Progress Note  01/27/2019 4:27 PM [name]  MRN:  [id number] Subjective: Follow-up for this gentleman with severe recurrent major depression.  Patient had an ECT treatment this morning.  The treatment itself went fine with a seizure that would be expected to be of affective quality.  The problem was managing his airway.  His tracheostomy has a lot of secretions associated with it.  Anesthesia worked very hard on it and his oxygen level did stay fine throughout but apparently he had a very rough time during recovery as well with a lot of coughing and secretions and some trouble staying oxygenated while he was waking up.  Mood wise he still complains of anxiety panic attacks feeling depressed all the time.  Passive suicidal ideation.  Does not appear to be actively psychotic.  He was started on a couple new medications both bupropion and Cytomel over the weekend. Principal Problem: MDD (major depressive disorder), recurrent episode, severe ([place]) Diagnosis: Principal Problem:   MDD (major depressive disorder), recurrent episode, severe (HCC) Active Problems:   Hypothyroidism   HTN (hypertension)   GERD (gastroesophageal reflux disease)   Type 1 diabetes mellitus ([place])   Tracheostomy in place [facility])   Obsessive compulsive disorder  Total Time spent with patient: 30 minutes  Past Psychiatric History: Patient has a history of depression and anxiety with several prior suicide attempts  Past Medical History:  Past Medical History:  Diagnosis Date  . Depression   . Diabetes (HCC)    Insulin Pump  . Diabetes mellitus type I ([place])   . Diabetes mellitus without complication ([place])   . GERD (gastroesophageal reflux disease)   . H/O laryngectomy   . Heel bone fracture   . Hyperlipidemia   . Hypertension   . Radicular pain of right lower extremity   . Stroke ([place])   . Suicide attempt [facility]) 2014   damaged larynx - tracheostomy  . Thyroid disease     Past Surgical History:   Procedure Laterality Date  . COLONOSCOPY WITH PROPOFOL N/A 05/15/2018   Procedure: COLONOSCOPY WITH PROPOFOL;  Surgeon: [name], [name], MD;  Location: [facility] ENDOSCOPY;  Service: Gastroenterology;  Laterality: N/A;  . ESOPHAGOGASTRODUODENOSCOPY N/A 05/15/2018   Procedure: ESOPHAGOGASTRODUODENOSCOPY (EGD);  Surgeon: [name], [name], MD;  Location: [facility] ENDOSCOPY;  Service: Gastroenterology;  Laterality: N/A;  . FRACTURE SURGERY    . Heel bone reconstruction Left   . HERNIA REPAIR  [id number]   Umbilical hernia repair   . LARYNGECTOMY    . NECK SURGERY     fusion  . SPINE SURGERY    . TRACHEOSTOMY  2014   from SI attempt   Family History:  Family History  Problem Relation Age of Onset  . Osteoporosis Mother   . Diabetes Mother   . Hypertension Father   . Mental illness Neg Hx    Family Psychiatric  History: See previous Social History:  Social History   Substance and Sexual Activity  Alcohol Use Yes  . Alcohol/week: 2.0 standard drinks  . Types: 2 Shots of liquor per week   Comment: rare     Social History   Substance and Sexual Activity  Drug Use No   Comment: Pt denied; UDS not available    Social History   Socioeconomic History  . Marital status: Married    Spouse name: Not on file  . Number of children: Not on file  . Years of education: Not on file  . Highest education level: Not  on file  Occupational History  . Not on file  Social Needs  . Financial resource strain: Not on file  . Food insecurity    Worry: Not on file    Inability: Not on file  . Transportation needs    Medical: Not on file    Non-medical: Not on file  Tobacco Use  . Smoking status: Former Smoker    Packs/day: 0.00    Types: Cigarettes    Quit date: 04/09/2013    Years since quitting: 5.8  . Smokeless tobacco: Never Used  Substance and Sexual Activity  . Alcohol use: Yes    Alcohol/week: 2.0 standard drinks    Types: 2 Shots of liquor per week    Comment: rare  . Drug use: No     Comment: Pt denied; UDS not available  . Sexual activity: Yes    Partners: Female    Birth control/protection: Condom  Lifestyle  . Physical activity    Days per week: Not on file    Minutes per session: Not on file  . Stress: Not on file  Relationships  . Social Herbalist on phone: Not on file    Gets together: Not on file    Attends religious service: Not on file    Active member of club or organization: Not on file    Attends meetings of clubs or organizations: Not on file    Relationship status: Not on file  Other Topics Concern  . Not on file  Social History Narrative  . Not on file   Additional Social History:                         Sleep: Fair  Appetite:  Fair  Current Medications: Current Facility-Administered Medications  Medication Dose Route Frequency Provider Last Rate Last Dose  . acetaminophen (TYLENOL) tablet 650 mg  650 mg Oral Q6H PRN Suella Broad, FNP      . albuterol (PROVENTIL) (2.5 MG/3ML) 0.083% nebulizer solution 2.5 mg  2.5 mg Nebulization TID PRN Burt Ek Gayland Curry, FNP      . alum & mag hydroxide-simeth (MAALOX/MYLANTA) 200-200-20 MG/5ML suspension 30 mL  30 mL Oral Q4H PRN Suella Broad, FNP   30 mL at 01/26/19 2152  . amLODipine (NORVASC) tablet 5 mg  5 mg Oral Daily Suella Broad, FNP   5 mg at 01/27/19 0900  . aspirin EC tablet 81 mg  81 mg Oral Daily Suella Broad, FNP   81 mg at 01/27/19 0900  . buPROPion (WELLBUTRIN XL) 24 hr tablet 150 mg  150 mg Oral Daily n Hai, MD   150 mg at 01/27/19 0900  . citalopram (CELEXA) tablet 40 mg  40 mg Oral Daily ,  T, MD   40 mg at 01/27/19 0900  . hydrOXYzine (ATARAX/VISTARIL) tablet 25 mg  25 mg Oral TID PRN Suella Broad, FNP   25 mg at 01/27/19 1539  . insulin aspart (novoLOG) injection 0-15 Units  0-15 Units Subcutaneous TID WC , [name], MD   8 Units at 01/27/19 1251  . insulin aspart (novoLOG) injection 0-5  Units  0-5 Units Subcutaneous QHS Suella Broad, FNP      . insulin aspart (novoLOG) injection 10 Units  10 Units Subcutaneous TID WC , [name], MD   10 Units at 01/27/19 1251  . insulin glargine (LANTUS) injection 33 Units  33 Units Subcutaneous Daily ,  [name], MD   33 Units at 01/26/19 [phone]  . liothyronine (CYTOMEL) tablet 10 mcg  10 mcg Oral Daily Johnn Hai, MD   10 mcg at 01/27/19 0900  . loperamide (IMODIUM) capsule 2 mg  2 mg Oral PRN , [name], MD   2 mg at 01/24/19 0744  . magnesium hydroxide (MILK OF MAGNESIA) suspension 30 mL  30 mL Oral Daily PRN Starkes-Perry, Gayland Curry, FNP      . traZODone (DESYREL) tablet 100 mg  100 mg Oral QHS PRN Suella Broad, FNP   100 mg at 01/26/19 2236    Lab Results:  Results for orders placed or performed during the hospital encounter of 01/22/19 (from the past 48 hour(s))  Glucose, capillary     Status: Abnormal   Collection Time: 01/25/19  8:56 PM  Result Value Ref Range   Glucose-Capillary 68 (L) 70 - 99 mg/dL   Comment 1 Notify RN   Glucose, capillary     Status: Abnormal   Collection Time: 01/26/19  7:08 AM  Result Value Ref Range   Glucose-Capillary 183 (H) 70 - 99 mg/dL   Comment 1 Notify RN   Glucose, capillary     Status: Abnormal   Collection Time: 01/26/19 11:27 AM  Result Value Ref Range   Glucose-Capillary 149 (H) 70 - 99 mg/dL  Glucose, capillary     Status: Abnormal   Collection Time: 01/26/19  4:08 PM  Result Value Ref Range   Glucose-Capillary 102 (H) 70 - 99 mg/dL   Comment 1 Notify RN   Glucose, capillary     Status: None   Collection Time: 01/26/19  8:26 PM  Result Value Ref Range   Glucose-Capillary 79 70 - 99 mg/dL  Glucose, capillary     Status: Abnormal   Collection Time: 01/27/19  7:31 AM  Result Value Ref Range   Glucose-Capillary 173 (H) 70 - 99 mg/dL  Glucose, capillary     Status: Abnormal   Collection Time: 01/27/19 11:51 AM  Result Value Ref Range   Glucose-Capillary  258 (H) 70 - 99 mg/dL  Glucose, capillary     Status: Abnormal   Collection Time: 01/27/19  4:22 PM  Result Value Ref Range   Glucose-Capillary 231 (H) 70 - 99 mg/dL    Blood Alcohol level:  Lab Results  Component Value Date   ETH <10 01/21/2019   ETH <10 [id number]    Metabolic Disorder Labs: Lab Results  Component Value Date   HGBA1C 7.8 (H) 01/01/2019   MPG 177.16 01/01/2019   MPG 174.29 12/23/2018   No results found for: PROLACTIN Lab Results  Component Value Date   CHOL 161 09/15/2016   TRIG 190 (H) 09/15/2016   HDL 41 09/15/2016   CHOLHDL 3.9 09/15/2016   VLDL 38 09/15/2016   LDLCALC 82 09/15/2016    Physical Findings: AIMS:  , ,  ,  ,    CIWA:    COWS:     Musculoskeletal: Strength & Muscle Tone: within normal limits Gait & Station: normal Patient leans: N/A  Psychiatric Specialty Exam: Physical Exam  Nursing note and vitals reviewed. Constitutional: He appears well-developed and well-nourished.  HENT:  Head: Normocephalic and atraumatic.  Eyes: Pupils are equal, round, and reactive to light. Conjunctivae are normal.  Neck: Normal range of motion.  Cardiovascular: Regular rhythm and normal heart sounds.  Respiratory: Effort normal. No respiratory distress.  GI: Soft.  Musculoskeletal: Normal range of motion.  Neurological: He is  alert.  Skin: Skin is warm and dry.  Psychiatric: His speech is normal and behavior is normal. Judgment and thought content normal. His mood appears anxious. His affect is blunt. Cognition and memory are normal. He exhibits a depressed mood.    Review of Systems  Constitutional: Negative.   HENT: Negative.   Eyes: Negative.   Respiratory: Negative.   Cardiovascular: Negative.   Gastrointestinal: Negative.   Musculoskeletal: Negative.   Skin: Negative.   Neurological: Negative.   Psychiatric/Behavioral: Positive for depression. The patient is nervous/anxious.     Blood pressure 137/83, pulse 68, temperature (!) 97 F  (36.1 C), resp. rate 19, height 6' (1.829 m), weight 111 kg, SpO2 100 %.Body mass index is 33.19 kg/m.  General Appearance: Casual  Eye Contact:  Fair  Speech:  Normal Rate  Volume:  Normal  Mood:  Anxious and Dysphoric  Affect:  Congruent  Thought Process:  Coherent  Orientation:  Full (Time, Place, and Person)  Thought Content:  Logical  Suicidal Thoughts:  Yes.  without intent/plan  Homicidal Thoughts:  No  Memory:  Immediate;   Fair Recent;   Fair Remote;   Fair  Judgement:  Fair  Insight:  Fair  Psychomotor Activity:  Normal  Concentration:  Concentration: Fair  Recall:  [employer] of Knowledge:  Fair  Language:  Fair  Akathisia:  No  Handed:  Right  AIMS (if indicated):     Assets:  Desire for Improvement Housing Resilience  ADL's:  Intact  Cognition:  WNL  Sleep:  Number of Hours: 6     Treatment Plan Summary: Daily contact with patient to assess and evaluate symptoms and progress in treatment, Medication management and Plan It is very unfortunate that his recovery from ECT was so difficult because I think that he is still a very good candidate for the treatment per se.  Patient found the experience frightening however and is not likely to be willing to consent again.  We talked about discharge which is at this point his greatest desire.  I will talk with him tomorrow about it again.  I know he wants to get out of here but I also know that outside the hospital his depression seems to be worse and worse.  We did discuss the new medicines he is taking again.  Dr. Jake Samples went over them with him over the weekend but the patient and I discussed it today as well.  I am not going to definitely say were planning discharge tomorrow but there is a fair chance of it depending on how he is feeling at that point.  Meanwhile his blood sugars of course are still not very well controlled without the insulin pump.  Alethia Berthold, MD 01/27/2019, 4:27 PM

## 2019-01-27 NOTE — H&P (Signed)
[name] is an 58 y.o. male.   Chief Complaint: recurrent depression  HPI: severe depression with SI  Past Medical History:  Diagnosis Date  . Depression   . Diabetes (HCC)    Insulin Pump  . Diabetes mellitus type I ([place])   . Diabetes mellitus without complication ([place])   . GERD (gastroesophageal reflux disease)   . H/O laryngectomy   . Heel bone fracture   . Hyperlipidemia   . Hypertension   . Radicular pain of right lower extremity   . Stroke ([place])   . Suicide attempt [facility]) 2014   damaged larynx - tracheostomy  . Thyroid disease     Past Surgical History:  Procedure Laterality Date  . COLONOSCOPY WITH PROPOFOL N/A 05/15/2018   Procedure: COLONOSCOPY WITH PROPOFOL;  Surgeon: [name], [name], MD;  Location: [facility] ENDOSCOPY;  Service: Gastroenterology;  Laterality: N/A;  . ESOPHAGOGASTRODUODENOSCOPY N/A 05/15/2018   Procedure: ESOPHAGOGASTRODUODENOSCOPY (EGD);  Surgeon: [name], [name], MD;  Location: [facility] ENDOSCOPY;  Service: Gastroenterology;  Laterality: N/A;  . FRACTURE SURGERY    . Heel bone reconstruction Left   . HERNIA REPAIR  [id number]   Umbilical hernia repair   . LARYNGECTOMY    . NECK SURGERY     fusion  . SPINE SURGERY    . TRACHEOSTOMY  2014   from SI attempt    Family History  Problem Relation Age of Onset  . Osteoporosis Mother   . Diabetes Mother   . Hypertension Father   . Mental illness Neg Hx    Social History:  reports that he quit smoking about 5 years ago. His smoking use included cigarettes. He smoked 0.00 packs per day. He has never used smokeless tobacco. He reports current alcohol use of about 2.0 standard drinks of alcohol per week. He reports that he does not use drugs.  Allergies:  Allergies  Allergen Reactions  . Buspar [Buspirone]     Makes the patient "flip out"  . Depakote [Valproic Acid]     Causes excessive drowsiness  . Clopidogrel Rash  . Gabapentin Itching and Rash    Medications Prior to Admission  Medication  Sig Dispense Refill  . losartan-hydrochlorothiazide (HYZAAR) 100-25 MG tablet Take 1 tablet by mouth daily.    Marland Kitchen albuterol (ACCUNEB) 1.25 MG/3ML nebulizer solution Take 1 ampule by nebulization 2 (two) times daily as needed for wheezing or shortness of breath.     Marland Kitchen amLODipine (NORVASC) 5 MG tablet Take 1 tablet (5 mg total) by mouth daily. 30 tablet 1  . aspirin EC 81 MG tablet Take 1 tablet (81 mg total) by mouth daily. 30 tablet 1  . citalopram (CELEXA) 20 MG tablet Take 1.5 tablets (30 mg total) by mouth daily. 45 tablet 1  . empagliflozin (JARDIANCE) 25 MG TABS tablet Take 25 mg by mouth daily. 30 tablet 1  . fluticasone (FLONASE) 50 MCG/ACT nasal spray Place 2 sprays into both nostrils daily. 16 g 2  . hydrochlorothiazide (HYDRODIURIL) 25 MG tablet Take 1 tablet (25 mg total) by mouth daily. (Patient not taking: Reported on 01/21/2019) 30 tablet 1  . insulin aspart (NOVOLOG) 100 UNIT/ML injection Inject 1.15-1.25 Units into the skin See admin instructions. Via insulin pump-basal rate: 1.15-1.25 units/hr (every 8 carbs = 1 unit of insulin bolus) 0000-0800 = 1.15 units/hr and 0801/2359 = 1.25 units/hr    . levothyroxine (SYNTHROID) 75 MCG tablet Take 1 tablet (75 mcg total) by mouth daily at 6 (six) AM. 30 tablet 1  .  losartan (COZAAR) 100 MG tablet Take 1 tablet (100 mg total) by mouth daily. (Patient not taking: Reported on 01/21/2019) 30 tablet 1  . pantoprazole (PROTONIX) 40 MG tablet Take 1 tablet (40 mg total) by mouth daily. (Patient taking differently: Take 40 mg by mouth 2 (two) times daily. ) 30 tablet 1  . QUEtiapine (SEROQUEL) 200 MG tablet Take 1 tablet (200 mg total) by mouth at bedtime. 30 tablet 1  . simvastatin (ZOCOR) 20 MG tablet Take 1 tablet (20 mg total) by mouth daily at 6 PM. 30 tablet 1  . traMADol (ULTRAM) 50 MG tablet Take 2 tablets (100 mg total) by mouth every 6 (six) hours as needed for moderate pain or severe pain. 60 tablet 1  . traZODone (DESYREL) 100 MG tablet Take  1 tablet (100 mg total) by mouth at bedtime. 30 tablet 1    Results for orders placed or performed during the hospital encounter of 01/22/19 (from the past 48 hour(s))  Glucose, capillary     Status: Abnormal   Collection Time: 01/25/19 12:09 PM  Result Value Ref Range   Glucose-Capillary 114 (H) 70 - 99 mg/dL  Glucose, capillary     Status: Abnormal   Collection Time: 01/25/19  4:05 PM  Result Value Ref Range   Glucose-Capillary 212 (H) 70 - 99 mg/dL   Comment 1 Document in Chart   Glucose, capillary     Status: Abnormal   Collection Time: 01/25/19  8:56 PM  Result Value Ref Range   Glucose-Capillary 68 (L) 70 - 99 mg/dL   Comment 1 Notify RN   Glucose, capillary     Status: Abnormal   Collection Time: 01/26/19  7:08 AM  Result Value Ref Range   Glucose-Capillary 183 (H) 70 - 99 mg/dL   Comment 1 Notify RN   Glucose, capillary     Status: Abnormal   Collection Time: 01/26/19 11:27 AM  Result Value Ref Range   Glucose-Capillary 149 (H) 70 - 99 mg/dL  Glucose, capillary     Status: Abnormal   Collection Time: 01/26/19  4:08 PM  Result Value Ref Range   Glucose-Capillary 102 (H) 70 - 99 mg/dL   Comment 1 Notify RN   Glucose, capillary     Status: None   Collection Time: 01/26/19  8:26 PM  Result Value Ref Range   Glucose-Capillary 79 70 - 99 mg/dL  Glucose, capillary     Status: Abnormal   Collection Time: 01/27/19  7:31 AM  Result Value Ref Range   Glucose-Capillary 173 (H) 70 - 99 mg/dL   No results found.  Review of Systems  Constitutional: Negative.   HENT: Negative.   Eyes: Negative.   Respiratory: Negative.   Cardiovascular: Negative.   Gastrointestinal: Negative.   Musculoskeletal: Negative.   Skin: Negative.   Neurological: Negative.   Psychiatric/Behavioral: Positive for depression and suicidal ideas. Negative for hallucinations, memory loss and substance abuse. The patient is nervous/anxious and has insomnia.     Blood pressure (!) 173/93, pulse 61,  temperature 98.4 F (36.9 C), temperature source Oral, resp. rate 18, height 6' (1.829 m), weight 111 kg, SpO2 98 %. Physical Exam  Nursing note and vitals reviewed. Constitutional: He appears well-developed and well-nourished.  HENT:  Head: Normocephalic and atraumatic.  Eyes: Pupils are equal, round, and reactive to light. Conjunctivae are normal.  Neck: Normal range of motion.  Cardiovascular: Regular rhythm and normal heart sounds.  Respiratory: Effort normal. No respiratory distress.  GI: Soft.  Musculoskeletal: Normal range of motion.  Neurological: He is alert.  Skin: Skin is warm and dry.  Psychiatric: Judgment normal. His speech is delayed. He is slowed. Cognition and memory are normal. He exhibits a depressed mood. He expresses suicidal ideation. He expresses no suicidal plans.     Assessment/Plan Begin ect as inpatient  Alethia Berthold, MD 01/27/2019, 10:03 AM

## 2019-01-27 NOTE — Transfer of Care (Signed)
Immediate Anesthesia Transfer of Care Note  Patient: [name]  Procedure(s) Performed: ECT TX  Patient Location: PACU  Anesthesia Type:General  Level of Consciousness: drowsy and patient cooperative  Airway & Oxygen Therapy: Patient Spontanous Breathing and Patient connected to tracheostomy mask oxygen  Post-op Assessment: Report given to RN and Post -op Vital signs reviewed and stable  Post vital signs: Reviewed and stable  Last Vitals:  Vitals Value Taken Time  BP 147/95 01/27/2019 1136  Temp    Pulse 68   Resp 19   SpO2 90%     Last Pain:  Vitals:   01/27/19 0950  TempSrc:   PainSc: 0-No pain         Complications: No apparent anesthesia complications

## 2019-01-27 NOTE — Progress Notes (Addendum)
This Probation officer was informed by MD that it is ok to administer patient's scheduled medication, except for Insulin.

## 2019-01-27 NOTE — Procedures (Signed)
ECT SERVICES Physician's Interval Evaluation & Treatment Note  Patient Identification: [name] MRN:  [id number] Date of Evaluation:  01/27/2019 TX #: 1  MADRS:   MMSE:   P.E. Findings:  Physical exam tracheostomy in place.  Heart and lungs otherwise normal no new findings  Psychiatric Interval Note:  Depressed anxious  Subjective:  Patient is a 58 y.o. male seen for evaluation for Electroconvulsive Therapy. Depression with chronic suicidal ideation  Treatment Summary:   [x]   Right Unilateral             []  Bilateral   % Energy : 0.3 ms 60%   Impedance: 1920 ohms  Seizure Energy Index: 5765 V squared  Postictal Suppression Index: No reading  Seizure Concordance Index: 92%  Medications  Pre Shock: Brevital 110 mg succinylcholine 120 mg  Post Shock: 42 seconds EMG 58 seconds EEG  Seizure Duration: See above.  No post shock medicine.   Comments: Follow-up Wednesday and Friday  Lungs:  [x]   Clear to auscultation               []  Other:   Heart:    [x]   Regular rhythm             []  irregular rhythm    [x]   Previous H&P reviewed, patient examined and there are NO CHANGES                 []   Previous H&P reviewed, patient examined and there are changes noted.   Alethia Berthold, MD 9/21/202011:02 AM

## 2019-01-27 NOTE — Progress Notes (Signed)
D -Patient was in his room upon arrival to the unit. Patient was pleasant during assessment. Patient denies SI/HI/AVH, pain. Patient said his anxiety and depression are getting a lot better. Patient said he was feeling better since he was back on his medications. Patient was observed interacting appropriately with staff and peers on the unit.  A - Patient compliant with medication administration per MD orders. Patient oriented to his room and the unit. Patient given education. Patient given support and encouragement to be active in his treatment plan. Patient informed to let staff know if there are any issues or problems on the unit.   R - Patient being monitored Q 15 minutes for safety per unit protocol. Patient remains safe on the unit.

## 2019-01-27 NOTE — Anesthesia Post-op Follow-up Note (Signed)
Anesthesia QCDR form completed.        

## 2019-01-27 NOTE — Anesthesia Preprocedure Evaluation (Signed)
Anesthesia Evaluation  Patient identified by MRN, date of birth, ID band Patient awake    Reviewed: Allergy & Precautions, NPO status , Patient's Chart, lab work & pertinent test results  History of Anesthesia Complications Negative for: history of anesthetic complications  Airway Mallampati: Trach  TM Distance: >3 FB Neck ROM: Full    Dental no notable dental hx.    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 on medications (-) CAD, (-) Past MI, (-) Cardiac Stents and (-) CABG  Rhythm:Regular Rate:Normal - Systolic murmurs and - Diastolic murmurs    Neuro/Psych PSYCHIATRIC DISORDERS Depression CVA    GI/Hepatic Neg liver ROS, GERD  ,  Endo/Other  diabetes, Type 1, Insulin DependentHypothyroidism   Renal/GU negative Renal ROS     Musculoskeletal negative musculoskeletal ROS (+)   Abdominal (+) + obese,   Peds  Hematology negative hematology ROS (+)   Anesthesia Other Findings Past Medical History: No date: Depression No date: Diabetes (HCC)     Comment:  Insulin Pump No date: Diabetes mellitus type I (HCC) No date: Diabetes mellitus without complication (HCC) No date: GERD (gastroesophageal reflux disease) No date: H/O laryngectomy No date: Heel bone fracture No date: Hyperlipidemia No date: Hypertension No date: Radicular pain of right lower extremity No date: Stroke [facility]) 2014: Suicide attempt ([place])     Comment:  damaged larynx - tracheostomy No date: Thyroid disease   Reproductive/Obstetrics                             Anesthesia Physical Anesthesia Plan  ASA: III  Anesthesia Plan: General   Post-op Pain Management:    Induction: Intravenous  PONV Risk Score and Plan: 1  Airway Management Planned:  Tracheostomy  Additional Equipment:   Intra-op Plan:   Post-operative Plan:   Informed Consent: I have reviewed the patients History and Physical, chart, labs and discussed the procedure including the risks, benefits and alternatives for the proposed anesthesia with the patient or authorized representative who has indicated his/her understanding and acceptance.     Dental advisory given  Plan Discussed with: CRNA and Anesthesiologist  Anesthesia Plan Comments: (Plan to remove current tracheostomy tube and replace with cuffed tube to facilitate PPV for procedure)        Anesthesia Quick Evaluation

## 2019-01-27 NOTE — Progress Notes (Signed)
Pt in PACU coughing and states he feels to be in need of suctioning. VSS. Dr. Andree Coss and Amy, RT, at bedside.  Pt suctioned x 2. States he feels better.VSS   Pt began to cough again and requested suctioning again. Notified RT who came to bedside. Pt suctioned and evaluated by Beverlee Nims, RT.  VSS. HR 68, 97% on room air.

## 2019-01-27 NOTE — Progress Notes (Signed)
D: Patient has been asking for medication for anxiety and panic attacks, but patient has been calm, cooperative, pleasant and socializing with peers on the unit. Mood is pleasant. Affect is appropriate to circumstance. A: Continue to monitor for safety R: Safety maintained.

## 2019-01-27 NOTE — Anesthesia Procedure Notes (Signed)
Date/Time: 01/27/2019 11:14 AM Performed by: Dionne Bucy, CRNA Pre-anesthesia Checklist: Patient identified, Emergency Drugs available, Suction available and Patient being monitored Patient Re-evaluated:Patient Re-evaluated prior to induction Oxygen Delivery Method: Circle system utilized Preoxygenation: Pre-oxygenation with 100% oxygen Induction Type: IV induction Ventilation: Mask ventilation throughout procedure Airway Equipment and Method: Bite block Placement Confirmation: positive ETCO2 Dental Injury: Teeth and Oropharynx as per pre-operative assessment  Comments: Pt ventilated via in situ trach with Shiley #5

## 2019-01-27 NOTE — Progress Notes (Signed)
Recreation Therapy Notes  Date: 01/27/2019  Time: 9:30 am    Location: Craft room   Behavioral response: N/A   Intervention Topic: Stress  Discussion/Intervention: Patient did not attend group.   Clinical Observations/Feedback:  Patient did not attend group.   Evaleen Sant LRT/CTRS        Tylyn Stankovich 01/27/2019 11:05 AM

## 2019-01-28 LAB — GLUCOSE, CAPILLARY
Glucose-Capillary: 250 mg/dL — ABNORMAL HIGH (ref 70–99)
Glucose-Capillary: 206 mg/dL — ABNORMAL HIGH (ref 70–99)
Glucose-Capillary: 54 mg/dL — ABNORMAL LOW (ref 70–99)
Glucose-Capillary: 105 mg/dL — ABNORMAL HIGH (ref 70–99)
Glucose-Capillary: 239 mg/dL — ABNORMAL HIGH (ref 70–99)

## 2019-01-28 MED ORDER — LIOTHYRONINE SODIUM 5 MCG PO TABS: 10.0000 ug | ORAL_TABLET | Freq: Every day | ORAL | 0 refills | Status: DC

## 2019-01-28 MED ORDER — QUETIAPINE FUMARATE 25 MG PO TABS: 25.0000 mg | ORAL_TABLET | ORAL | 0 refills | Status: DC | PRN

## 2019-01-28 MED ORDER — BUPROPION HCL ER (XL) 150 MG PO TB24: 150.0000 mg | ORAL_TABLET | Freq: Every day | ORAL | 0 refills | Status: DC

## 2019-01-28 MED ORDER — CITALOPRAM HYDROBROMIDE 40 MG PO TABS: 40.0000 mg | ORAL_TABLET | Freq: Every day | ORAL | 0 refills | Status: DC

## 2019-01-28 MED ORDER — QUETIAPINE FUMARATE 50 MG PO TABS: 50.0000 mg | ORAL_TABLET | Freq: Every day | ORAL | 0 refills | Status: DC

## 2019-01-28 MED ORDER — TRAZODONE HCL 100 MG PO TABS: 100.0000 mg | ORAL_TABLET | Freq: Every evening | ORAL | 0 refills | Status: DC | PRN

## 2019-01-28 NOTE — Tx Team (Signed)
Interdisciplinary Treatment and Diagnostic Plan Update  01/28/2019 Time of Session: 900am [name] MRN: [id number]  Principal Diagnosis: MDD (major depressive disorder), recurrent episode, severe ([place])  Secondary Diagnoses: Principal Problem:   MDD (major depressive disorder), recurrent episode, severe ([place]) Active Problems:   Hypothyroidism   HTN (hypertension)   GERD (gastroesophageal reflux disease)   Type 1 diabetes mellitus ([place])   Tracheostomy in place [facility])   Obsessive compulsive disorder   Current Medications:  Current Facility-Administered Medications  Medication Dose Route Frequency Provider Last Rate Last Dose  . acetaminophen (TYLENOL) tablet 650 mg  650 mg Oral Q6H PRN Suella Broad, FNP   650 mg at 01/27/19 1738  . albuterol (PROVENTIL) (2.5 MG/3ML) 0.083% nebulizer solution 2.5 mg  2.5 mg Nebulization TID PRN Burt Ek Gayland Curry, FNP      . alum & mag hydroxide-simeth (MAALOX/MYLANTA) 200-200-20 MG/5ML suspension 30 mL  30 mL Oral Q4H PRN Suella Broad, FNP   30 mL at 01/26/19 2152  . amLODipine (NORVASC) tablet 5 mg  5 mg Oral Daily Suella Broad, FNP   5 mg at 01/28/19 0736  . aspirin EC tablet 81 mg  81 mg Oral Daily Suella Broad, FNP   81 mg at 01/28/19 0735  . buPROPion (WELLBUTRIN XL) 24 hr tablet 150 mg  150 mg Oral Daily Johnn Hai, MD   150 mg at 01/28/19 0735  . citalopram (CELEXA) tablet 40 mg  40 mg Oral Daily Clapacs, [name], MD   40 mg at 01/28/19 0735  . hydrOXYzine (ATARAX/VISTARIL) tablet 25 mg  25 mg Oral TID PRN Suella Broad, FNP   25 mg at 01/27/19 2106  . insulin aspart (novoLOG) injection 0-15 Units  0-15 Units Subcutaneous TID WC Clapacs, [name], MD   5 Units at 01/28/19 0737  . insulin aspart (novoLOG) injection 0-5 Units  0-5 Units Subcutaneous QHS Suella Broad, FNP   2 Units at 01/27/19 2106  . insulin aspart (novoLOG) injection 10 Units  10 Units Subcutaneous TID WC  Clapacs, [name], MD   10 Units at 01/28/19 [phone]  . insulin glargine (LANTUS) injection 33 Units  33 Units Subcutaneous Daily Clapacs, [name], MD   33 Units at 01/28/19 0736  . liothyronine (CYTOMEL) tablet 10 mcg  10 mcg Oral Daily Johnn Hai, MD   10 mcg at 01/28/19 0736  . loperamide (IMODIUM) capsule 2 mg  2 mg Oral PRN Clapacs, [name], MD   2 mg at 01/24/19 0744  . magnesium hydroxide (MILK OF MAGNESIA) suspension 30 mL  30 mL Oral Daily PRN Suella Broad, FNP      . QUEtiapine (SEROQUEL) tablet 25 mg  25 mg Oral Q4H PRN Clapacs, [name], MD   25 mg at 01/28/19 0100  . QUEtiapine (SEROQUEL) tablet 50 mg  50 mg Oral QHS Clapacs, [name], MD   50 mg at 01/27/19 2107  . traZODone (DESYREL) tablet 100 mg  100 mg Oral QHS PRN Suella Broad, FNP   100 mg at 01/27/19 2106   PTA Medications: Medications Prior to Admission  Medication Sig Dispense Refill Last Dose  . losartan-hydrochlorothiazide (HYZAAR) 100-25 MG tablet Take 1 tablet by mouth daily.   01/27/2019 at Unknown time  . albuterol (ACCUNEB) 1.25 MG/3ML nebulizer solution Take 1 ampule by nebulization 2 (two) times daily as needed for wheezing or shortness of breath.      Marland Kitchen amLODipine (NORVASC) 5 MG tablet Take  1 tablet (5 mg total) by mouth daily. 30 tablet 1   . aspirin EC 81 MG tablet Take 1 tablet (81 mg total) by mouth daily. 30 tablet 1   . citalopram (CELEXA) 20 MG tablet Take 1.5 tablets (30 mg total) by mouth daily. 45 tablet 1   . empagliflozin (JARDIANCE) 25 MG TABS tablet Take 25 mg by mouth daily. 30 tablet 1   . fluticasone (FLONASE) 50 MCG/ACT nasal spray Place 2 sprays into both nostrils daily. 16 g 2   . hydrochlorothiazide (HYDRODIURIL) 25 MG tablet Take 1 tablet (25 mg total) by mouth daily. (Patient not taking: Reported on 01/21/2019) 30 tablet 1   . insulin aspart (NOVOLOG) 100 UNIT/ML injection Inject 1.15-1.25 Units into the skin See admin instructions. Via insulin pump-basal rate: 1.15-1.25 units/hr  (every 8 carbs = 1 unit of insulin bolus) 0000-0800 = 1.15 units/hr and 0801/2359 = 1.25 units/hr     . levothyroxine (SYNTHROID) 75 MCG tablet Take 1 tablet (75 mcg total) by mouth daily at 6 (six) AM. 30 tablet 1   . losartan (COZAAR) 100 MG tablet Take 1 tablet (100 mg total) by mouth daily. (Patient not taking: Reported on 01/21/2019) 30 tablet 1   . pantoprazole (PROTONIX) 40 MG tablet Take 1 tablet (40 mg total) by mouth daily. (Patient taking differently: Take 40 mg by mouth 2 (two) times daily. ) 30 tablet 1   . QUEtiapine (SEROQUEL) 200 MG tablet Take 1 tablet (200 mg total) by mouth at bedtime. 30 tablet 1   . simvastatin (ZOCOR) 20 MG tablet Take 1 tablet (20 mg total) by mouth daily at 6 PM. 30 tablet 1   . traMADol (ULTRAM) 50 MG tablet Take 2 tablets (100 mg total) by mouth every 6 (six) hours as needed for moderate pain or severe pain. 60 tablet 1   . traZODone (DESYREL) 100 MG tablet Take 1 tablet (100 mg total) by mouth at bedtime. 30 tablet 1     Patient Stressors: Health problems Medication change or noncompliance  Patient Strengths: Ability for insight Supportive family/friends  Treatment Modalities: Medication Management, Group therapy, Case management,  1 to 1 session with clinician, Psychoeducation, Recreational therapy.   Physician Treatment Plan for Primary Diagnosis: MDD (major depressive disorder), recurrent episode, severe ([place]) Long Term Goal(s): Improvement in symptoms so as ready for discharge Improvement in symptoms so as ready for discharge   Short Term Goals: Ability to disclose and discuss suicidal ideas Ability to demonstrate self-control will improve Compliance with prescribed medications will improve  Medication Management: Evaluate patient's response, side effects, and tolerance of medication regimen.  Therapeutic Interventions: 1 to 1 sessions, Unit Group sessions and Medication administration.  Evaluation of Outcomes: Progressing  Physician  Treatment Plan for Secondary Diagnosis: Principal Problem:   MDD (major depressive disorder), recurrent episode, severe ([place]) Active Problems:   Hypothyroidism   HTN (hypertension)   GERD (gastroesophageal reflux disease)   Type 1 diabetes mellitus ([place])   Tracheostomy in place [facility])   Obsessive compulsive disorder  Long Term Goal(s): Improvement in symptoms so as ready for discharge Improvement in symptoms so as ready for discharge   Short Term Goals: Ability to disclose and discuss suicidal ideas Ability to demonstrate self-control will improve Compliance with prescribed medications will improve     Medication Management: Evaluate patient's response, side effects, and tolerance of medication regimen.  Therapeutic Interventions: 1 to 1 sessions, Unit Group sessions and Medication administration.  Evaluation of Outcomes: Progressing  RN Treatment Plan for Primary Diagnosis: MDD (major depressive disorder), recurrent episode, severe ([place]) Long Term Goal(s): Knowledge of disease and therapeutic regimen to maintain health will improve  Short Term Goals: Ability to demonstrate self-control, Ability to participate in decision making will improve and Compliance with prescribed medications will improve  Medication Management: RN will administer medications as ordered by provider, will assess and evaluate patient's response and provide education to patient for prescribed medication. RN will report any adverse and/or side effects to prescribing provider.  Therapeutic Interventions: 1 on 1 counseling sessions, Psychoeducation, Medication administration, Evaluate responses to treatment, Monitor vital signs and CBGs as ordered, Perform/monitor CIWA, COWS, AIMS and Fall Risk screenings as ordered, Perform wound care treatments as ordered.  Evaluation of Outcomes: Progressing   LCSW Treatment Plan for Primary Diagnosis: MDD (major depressive disorder), recurrent episode, severe ([place]) Long  Term Goal(s): Safe transition to appropriate next level of care at discharge, Engage patient in therapeutic group addressing interpersonal concerns.  Short Term Goals: Engage patient in aftercare planning with referrals and resources, Facilitate acceptance of mental health diagnosis and concerns and Increase skills for wellness and recovery  Therapeutic Interventions: Assess for all discharge needs, 1 to 1 time with Social worker, Explore available resources and support systems, Assess for adequacy in community support network, Educate family and significant other(s) on suicide prevention, Complete Psychosocial Assessment, Interpersonal group therapy.  Evaluation of Outcomes: Progressing   Progress in Treatment: Attending groups: No. Participating in groups: No. Taking medication as prescribed: Yes. Toleration medication: Yes. Family/Significant other contact made: Yes, individual(s) contacted:  pts wife Patient understands diagnosis: Yes. Discussing patient identified problems/goals with staff: Yes. Medical problems stabilized or resolved: Yes. Denies suicidal/homicidal ideation: Yes. Issues/concerns per patient self-inventory: No. Other: N/A  New problem(s) identified: No, Describe:  none  New Short Term/Long Term Goal(s): Detox, elimination of AVH/symptoms of psychosis, medication management for mood stabilization; elimination of SI thoughts; development of comprehensive mental wellness/sobriety plan.   Patient Goals:  "Get some medicine so I dont go back to the want to kill myself stage"  Discharge Plan or Barriers: SPE pamphlet, Mobile Crisis information, and AA/NA information provided to patient for additional community support and resources. Pt is requesting referral to [facility]. CSW assessing for appropriate referrals. Update 01/28/19: CSW spoke with [facility] who reported they have sent pts information to the scheduler to schedule pt an appointment for psychiatry.  Reason for  Continuation of Hospitalization: Depression Medication stabilization Suicidal ideation  Estimated Length of Stay: 1-2 days  Recreational Therapy: Patient Stressors: N/A Patient Goal: Patient will engage in groups without prompting or encouragement from LRT x3 group sessions within 5 recreation therapy group sessions   Attendees: Patient:  01/28/2019 9:29 AM  Physician: Dr Weber Cooks MD 01/28/2019 9:29 AM  Nursing:  01/28/2019 9:29 AM  RN Care Manager: 01/28/2019 9:29 AM  Social Worker: Minette Brine Debbora Ang LCSW 01/28/2019 9:29 AM  Recreational Therapist:  01/28/2019 9:29 AM  Other:  01/28/2019 9:29 AM  Other:  01/28/2019 9:29 AM  Other: 01/28/2019 9:29 AM    Scribe for Treatment Team: Mariann Laster Cathy Ropp, LCSW 01/28/2019 9:29 AM

## 2019-01-28 NOTE — Progress Notes (Signed)
Inpatient Diabetes Program Recommendations  AACE/ADA: New Consensus Statement on Inpatient Glycemic Control (2015)  Target Ranges:  Prepandial:   less than 140 mg/dL      Peak postprandial:   less than 180 mg/dL (1-2 hours)      Critically ill patients:  140 - 180 mg/dL   Lab Results  Component Value Date   GLUCAP 206 (H) 01/28/2019   HGBA1C 7.8 (H) 01/01/2019    Review of Glycemic Control Results for [name], [name] (MRN [id number]) as of 01/28/2019 12:34  Ref. Range 01/28/2019 06:59 01/28/2019 11:45  Glucose-Capillary Latest Ref Range: 70 - 99 mg/dL 250 (H) 206 (H)   Inpatient Diabetes Program Recommendations:   CBGs elevated today due to not receiving Lantus insulin yesterday.   Thank you, Nani Gasser. Arpita Fentress, RN, MSN, CDE  Diabetes Coordinator Inpatient Glycemic Control Team Team Pager [phone] (8am-5pm) 01/28/2019 12:36 PM

## 2019-01-28 NOTE — Progress Notes (Signed)
Recreation Therapy Notes  Date: 01/28/2019  Time: 9:30 am    Location: Craft room   Behavioral response: N/A   Intervention Topic: Coping-Skills  Discussion/Intervention: Patient did not attend group.   Clinical Observations/Feedback:  Patient did not attend group.   [name] LRT/CTRS        [name] 01/28/2019 10:37 AM

## 2019-01-28 NOTE — Anesthesia Postprocedure Evaluation (Signed)
Anesthesia Post Note  Patient: [name]  Procedure(s) Performed: ECT TX  Patient location during evaluation: PACU Anesthesia Type: General Level of consciousness: awake and alert and oriented Pain management: pain level controlled Vital Signs Assessment: post-procedure vital signs reviewed and stable Respiratory status: spontaneous breathing, nonlabored ventilation and respiratory function stable Cardiovascular status: blood pressure returned to baseline and stable Postop Assessment: no signs of nausea or vomiting Anesthetic complications: no     Last Vitals:  Vitals:   01/27/19 1430 01/28/19 0635  BP: 139/70 (!) 151/83  Pulse: 72 70  Resp: 16 18  Temp: 37.4 C 36.7 C  SpO2: 99% 97%    Last Pain:  Vitals:   01/28/19 0635  TempSrc: Oral  PainSc:                  Prue Lingenfelter

## 2019-01-28 NOTE — Care Management (Signed)
Spoke with LaToya with at  [name] at [phone] Who was unable to schedule an appointment for the patient but stated she had given the patient's information to the [facility] to set it up.   Stated she would give a call back today with an appointment or a plan to reach out to the patient to set it up.

## 2019-01-28 NOTE — Progress Notes (Signed)
Hypoglycemic Event  CBG: 54  Treatment meal tray and orange juice  Symptoms: none  Follow-up CBG: Time:1639 CBG Result:105  Possible Reasons for Event: unknown,  had been eating all meals  Comments/MD notified: Notified MD. Pt refused 10 units of scheduled mealtime insulin also   Kieth Brightly

## 2019-01-28 NOTE — BHH Group Notes (Signed)
Feelings Around Diagnosis 01/28/2019 1PM  Type of Therapy/Topic:  Group Therapy:  Feelings about Diagnosis  Participation Level:  Active   Description of Group:   This group will allow patients to explore their thoughts and feelings about diagnoses they have received. Patients will be guided to explore their level of understanding and acceptance of these diagnoses. Facilitator will encourage patients to process their thoughts and feelings about the reactions of others to their diagnosis and will guide patients in identifying ways to discuss their diagnosis with significant others in their lives. This group will be process-oriented, with patients participating in exploration of their own experiences, giving and receiving support, and processing challenge from other group members.   Therapeutic Goals: 1. Patient will demonstrate understanding of diagnosis as evidenced by identifying two or more symptoms of the disorder 2. Patient will be able to express two feelings regarding the diagnosis 3. Patient will demonstrate their ability to communicate their needs through discussion and/or role play  Summary of Patient Progress:  Pt did not identify what his diagnosis is but did say he has a tendency of consistently thinking negative thoughts. He identified his mother and wife as positive supports. He states enjoying fishing as a healthy coping methods.     Therapeutic Modalities:   Cognitive Behavioral Therapy Brief Therapy Feelings Identification    Yvette Rack, LCSW 01/28/2019 2:03 PM

## 2019-01-28 NOTE — Plan of Care (Signed)
Pt rated depression 3/10 and anxiety 5/10. Pt denies SI, HI and AVH. Pt was educated on care plan and verbalizes understanding. Collier Bullock RN Problem: Education: Goal: Knowledge of [facility] Education information/materials will improve Outcome: Progressing Goal: Emotional status will improve Outcome: Progressing Goal: Mental status will improve Outcome: Progressing Goal: Verbalization of understanding the information provided will improve Outcome: Progressing   Problem: Safety: Goal: Periods of time without injury will increase Outcome: Progressing   Problem: Education: Goal: Ability to make informed decisions regarding treatment will improve Outcome: Progressing   Problem: Medication: Goal: Compliance with prescribed medication regimen will improve Outcome: Progressing

## 2019-01-28 NOTE — Progress Notes (Signed)
Pt suctioned twice this am before 12 noon. Collier Bullock RN

## 2019-01-28 NOTE — Progress Notes (Signed)
Pt had suction twice today. He has been complaining of his back being sore today. Pt has participated in groups and enjoyed being outside. Collier Bullock RN

## 2019-01-28 NOTE — Progress Notes (Signed)
[facility] MD Progress Note  01/28/2019 4:16 PM [name]  MRN:  [id number] Subjective: Follow-up for this patient with recurrent major depression.  He had ECT yesterday and had a difficult time of it in the recovery room because of his tracheostomy.  No evidence of any injury suffered but he did have a lot of discomfort.  Today he is feeling sore all over.  He does not want to repeat ECT.  Meanwhile he feels like his mood is a little more stable.  He says he has not had any suicidal thoughts in a day or so.  Patient has been taking care of his hygiene and interacting with others showing normal energy. Principal Problem: MDD (major depressive disorder), recurrent episode, severe ([place]) Diagnosis: Principal Problem:   MDD (major depressive disorder), recurrent episode, severe (HCC) Active Problems:   Hypothyroidism   HTN (hypertension)   GERD (gastroesophageal reflux disease)   Type 1 diabetes mellitus ([place])   Tracheostomy in place [facility])   Obsessive compulsive disorder  Total Time spent with patient: 30 minutes  Past Psychiatric History: Past history of serious suicide attempts recurrent depression  Past Medical History:  Past Medical History:  Diagnosis Date  . Depression   . Diabetes (HCC)    Insulin Pump  . Diabetes mellitus type I ([place])   . Diabetes mellitus without complication ([place])   . GERD (gastroesophageal reflux disease)   . H/O laryngectomy   . Heel bone fracture   . Hyperlipidemia   . Hypertension   . Radicular pain of right lower extremity   . Stroke ([place])   . Suicide attempt [facility]) 2014   damaged larynx - tracheostomy  . Thyroid disease     Past Surgical History:  Procedure Laterality Date  . COLONOSCOPY WITH PROPOFOL N/A 05/15/2018   Procedure: COLONOSCOPY WITH PROPOFOL;  Surgeon: [name], [name], MD;  Location: [facility] ENDOSCOPY;  Service: Gastroenterology;  Laterality: N/A;  . ESOPHAGOGASTRODUODENOSCOPY N/A 05/15/2018   Procedure: ESOPHAGOGASTRODUODENOSCOPY  (EGD);  Surgeon: [name], [name], MD;  Location: [facility] ENDOSCOPY;  Service: Gastroenterology;  Laterality: N/A;  . FRACTURE SURGERY    . Heel bone reconstruction Left   . HERNIA REPAIR  [id number]   Umbilical hernia repair   . LARYNGECTOMY    . NECK SURGERY     fusion  . SPINE SURGERY    . TRACHEOSTOMY  2014   from SI attempt   Family History:  Family History  Problem Relation Age of Onset  . Osteoporosis Mother   . Diabetes Mother   . Hypertension Father   . Mental illness Neg Hx    Family Psychiatric  History: See previous Social History:  Social History   Substance and Sexual Activity  Alcohol Use Yes  . Alcohol/week: 2.0 standard drinks  . Types: 2 Shots of liquor per week   Comment: rare     Social History   Substance and Sexual Activity  Drug Use No   Comment: Pt denied; UDS not available    Social History   Socioeconomic History  . Marital status: Married    Spouse name: Not on file  . Number of children: Not on file  . Years of education: Not on file  . Highest education level: Not on file  Occupational History  . Not on file  Social Needs  . Financial resource strain: Not on file  . Food insecurity    Worry: Not on file    Inability: Not on file  . Transportation needs  Medical: Not on file    Non-medical: Not on file  Tobacco Use  . Smoking status: Former Smoker    Packs/day: 0.00    Types: Cigarettes    Quit date: 04/09/2013    Years since quitting: 5.8  . Smokeless tobacco: Never Used  Substance and Sexual Activity  . Alcohol use: Yes    Alcohol/week: 2.0 standard drinks    Types: 2 Shots of liquor per week    Comment: rare  . Drug use: No    Comment: Pt denied; UDS not available  . Sexual activity: Yes    Partners: Female    Birth control/protection: Condom  Lifestyle  . Physical activity    Days per week: Not on file    Minutes per session: Not on file  . Stress: Not on file  Relationships  . Social Herbalist on  phone: Not on file    Gets together: Not on file    Attends religious service: Not on file    Active member of club or organization: Not on file    Attends meetings of clubs or organizations: Not on file    Relationship status: Not on file  Other Topics Concern  . Not on file  Social History Narrative  . Not on file   Additional Social History:                         Sleep: Fair  Appetite:  Fair  Current Medications: Current Facility-Administered Medications  Medication Dose Route Frequency Provider Last Rate Last Dose  . acetaminophen (TYLENOL) tablet 650 mg  650 mg Oral Q6H PRN Suella Broad, FNP   650 mg at 01/28/19 1207  . albuterol (PROVENTIL) (2.5 MG/3ML) 0.083% nebulizer solution 2.5 mg  2.5 mg Nebulization TID PRN Burt Ek Gayland Curry, FNP      . alum & mag hydroxide-simeth (MAALOX/MYLANTA) 200-200-20 MG/5ML suspension 30 mL  30 mL Oral Q4H PRN Suella Broad, FNP   30 mL at 01/26/19 2152  . amLODipine (NORVASC) tablet 5 mg  5 mg Oral Daily Suella Broad, FNP   5 mg at 01/28/19 0736  . aspirin EC tablet 81 mg  81 mg Oral Daily Suella Broad, FNP   81 mg at 01/28/19 0735  . buPROPion (WELLBUTRIN XL) 24 hr tablet 150 mg  150 mg Oral Daily Johnn Hai, MD   150 mg at 01/28/19 0735  . citalopram (CELEXA) tablet 40 mg  40 mg Oral Daily Lajoya Dombek, [name], MD   40 mg at 01/28/19 0735  . hydrOXYzine (ATARAX/VISTARIL) tablet 25 mg  25 mg Oral TID PRN Suella Broad, FNP   25 mg at 01/27/19 2106  . insulin aspart (novoLOG) injection 0-15 Units  0-15 Units Subcutaneous TID WC Riyah Bardon, [name], MD   5 Units at 01/28/19 1155  . insulin aspart (novoLOG) injection 0-5 Units  0-5 Units Subcutaneous QHS Suella Broad, FNP   2 Units at 01/27/19 2106  . insulin aspart (novoLOG) injection 10 Units  10 Units Subcutaneous TID WC Latrecia Capito, [name], MD   10 Units at 01/28/19 1154  . insulin glargine (LANTUS) injection 33 Units  33 Units  Subcutaneous Daily Zamara Cozad, [name], MD   33 Units at 01/28/19 0736  . liothyronine (CYTOMEL) tablet 10 mcg  10 mcg Oral Daily Johnn Hai, MD   10 mcg at 01/28/19 0736  . loperamide (IMODIUM) capsule 2 mg  2 mg Oral PRN Zykeria Laguardia, [name], MD   2 mg at 01/24/19 0744  . magnesium hydroxide (MILK OF MAGNESIA) suspension 30 mL  30 mL Oral Daily PRN Suella Broad, FNP      . QUEtiapine (SEROQUEL) tablet 25 mg  25 mg Oral Q4H PRN Destini Cambre T, MD   25 mg at 01/28/19 0100  . QUEtiapine (SEROQUEL) tablet 50 mg  50 mg Oral QHS Kayron Kalmar, [name], MD   50 mg at 01/27/19 2107  . traZODone (DESYREL) tablet 100 mg  100 mg Oral QHS PRN Suella Broad, FNP   100 mg at 01/27/19 2106    Lab Results:  Results for orders placed or performed during the hospital encounter of 01/22/19 (from the past 48 hour(s))  Glucose, capillary     Status: None   Collection Time: 01/26/19  8:26 PM  Result Value Ref Range   Glucose-Capillary 79 70 - 99 mg/dL  Glucose, capillary     Status: Abnormal   Collection Time: 01/27/19  7:31 AM  Result Value Ref Range   Glucose-Capillary 173 (H) 70 - 99 mg/dL  Glucose, capillary     Status: Abnormal   Collection Time: 01/27/19 11:51 AM  Result Value Ref Range   Glucose-Capillary 258 (H) 70 - 99 mg/dL  Glucose, capillary     Status: Abnormal   Collection Time: 01/27/19  4:22 PM  Result Value Ref Range   Glucose-Capillary 231 (H) 70 - 99 mg/dL  Glucose, capillary     Status: Abnormal   Collection Time: 01/27/19  8:38 PM  Result Value Ref Range   Glucose-Capillary 243 (H) 70 - 99 mg/dL  Glucose, capillary     Status: Abnormal   Collection Time: 01/28/19  6:59 AM  Result Value Ref Range   Glucose-Capillary 250 (H) 70 - 99 mg/dL   Comment 1 Notify RN   Glucose, capillary     Status: Abnormal   Collection Time: 01/28/19 11:45 AM  Result Value Ref Range   Glucose-Capillary 206 (H) 70 - 99 mg/dL    Blood Alcohol level:  Lab Results  Component Value Date   ETH  <10 01/21/2019   ETH <10 [id number]    Metabolic Disorder Labs: Lab Results  Component Value Date   HGBA1C 7.8 (H) 01/01/2019   MPG 177.16 01/01/2019   MPG 174.29 12/23/2018   No results found for: PROLACTIN Lab Results  Component Value Date   CHOL 161 09/15/2016   TRIG 190 (H) 09/15/2016   HDL 41 09/15/2016   CHOLHDL 3.9 09/15/2016   VLDL 38 09/15/2016   LDLCALC 82 09/15/2016    Physical Findings: AIMS:  , ,  ,  ,    CIWA:    COWS:     Musculoskeletal: Strength & Muscle Tone: within normal limits Gait & Station: normal Patient leans: N/A  Psychiatric Specialty Exam: Physical Exam  Nursing note and vitals reviewed. Constitutional: He appears well-developed and well-nourished.  HENT:  Head: Normocephalic and atraumatic.  Eyes: Pupils are equal, round, and reactive to light. Conjunctivae are normal.  Neck: Normal range of motion.  Cardiovascular: Regular rhythm and normal heart sounds.  Respiratory: Effort normal.      GI: Soft.  Musculoskeletal: Normal range of motion.  Neurological: He is alert.  Skin: Skin is warm and dry.  Psychiatric: He has a normal mood and affect. His speech is normal and behavior is normal. Judgment and thought content normal. Cognition and memory are normal.  Review of Systems  Constitutional: Negative.   HENT: Negative.   Eyes: Negative.   Respiratory: Negative.   Cardiovascular: Negative.   Gastrointestinal: Negative.   Musculoskeletal: Negative.   Skin: Negative.   Neurological: Negative.   Psychiatric/Behavioral: Negative.     Blood pressure (!) 151/83, pulse 70, temperature 98.1 F (36.7 C), temperature source Oral, resp. rate 18, height 6' (1.829 m), weight 111 kg, SpO2 97 %.Body mass index is 33.19 kg/m.  General Appearance: Fairly Groomed  Eye Contact:  Good  Speech:  Slow  Volume:  Decreased  Mood:  Euthymic  Affect:  Appropriate  Thought Process:  Goal Directed  Orientation:  Full (Time, Place, and Person)   Thought Content:  Logical  Suicidal Thoughts:  No  Homicidal Thoughts:  No  Memory:  Immediate;   Fair Recent;   Fair Remote;   Fair  Judgement:  Fair  Insight:  Fair  Psychomotor Activity:  Normal  Concentration:  Concentration: Fair  Recall:  [employer] of Knowledge:  Fair  Language:  Fair  Akathisia:  No  Handed:  Right  AIMS (if indicated):     Assets:  Desire for Improvement Housing  ADL's:  Intact  Cognition:  WNL  Sleep:  Number of Hours: 4.75     Treatment Plan Summary: Daily contact with patient to assess and evaluate symptoms and progress in treatment, Medication management and Plan Patient's depression is certainly a long-term challenging problem.  ECT seems to have been poorly tolerated this first time and we would not want to try it again until things became even more desperate.  Patient is now denying suicidal ideation and has an agreement to have an outpatient follow-up plan.  Blood sugars are adequately controlled for the moment.  Psychoeducation and supportive counseling and review of discharge plan.  We will make preparations to try and get him discharged tomorrow morning.  Alethia Berthold, MD 01/28/2019, 4:16 PM

## 2019-01-29 ENCOUNTER — Other Ambulatory Visit: Payer: Self-pay | Admitting: Psychiatry

## 2019-01-29 LAB — GLUCOSE, CAPILLARY
Glucose-Capillary: 274 mg/dL — ABNORMAL HIGH (ref 70–99)
Glucose-Capillary: 326 mg/dL — ABNORMAL HIGH (ref 70–99)

## 2019-01-29 MED ORDER — BUPROPION HCL ER (XL) 150 MG PO TB24: 150.0000 mg | ORAL_TABLET | Freq: Every day | ORAL | 1 refills | Status: DC

## 2019-01-29 MED ORDER — LIOTHYRONINE SODIUM 5 MCG PO TABS: 10.0000 ug | ORAL_TABLET | Freq: Every day | ORAL | 1 refills | Status: DC

## 2019-01-29 MED ORDER — TRAZODONE HCL 100 MG PO TABS: 100.0000 mg | ORAL_TABLET | Freq: Every evening | ORAL | 1 refills | Status: DC | PRN

## 2019-01-29 MED ORDER — ASPIRIN EC 81 MG PO TBEC: 81.0000 mg | DELAYED_RELEASE_TABLET | Freq: Every day | ORAL | 1 refills | Status: DC

## 2019-01-29 MED ORDER — QUETIAPINE FUMARATE 50 MG PO TABS: 50.0000 mg | ORAL_TABLET | Freq: Every day | ORAL | 1 refills | Status: DC

## 2019-01-29 MED ORDER — AMLODIPINE BESYLATE 5 MG PO TABS: 5.0000 mg | ORAL_TABLET | Freq: Every day | ORAL | 1 refills | Status: AC

## 2019-01-29 MED ORDER — CITALOPRAM HYDROBROMIDE 40 MG PO TABS: 40.0000 mg | ORAL_TABLET | Freq: Every day | ORAL | 1 refills | Status: DC

## 2019-01-29 MED ORDER — LEVOTHYROXINE SODIUM 75 MCG PO TABS: 75.0000 ug | ORAL_TABLET | Freq: Every day | ORAL | 1 refills | Status: DC

## 2019-01-29 MED ORDER — QUETIAPINE FUMARATE 25 MG PO TABS: 25.0000 mg | ORAL_TABLET | ORAL | 1 refills | Status: DC | PRN

## 2019-01-29 NOTE — Plan of Care (Signed)
Patient said he thought he was feeling better, thinks he will be ready to DC tomorrow  Problem: Education: Goal: Emotional status will improve Outcome: Progressing Goal: Mental status will improve Outcome: Progressing

## 2019-01-29 NOTE — Discharge Summary (Signed)
Physician Discharge Summary Note  Patient:  [name] is an 58 y.o., male MRN:  [id number] DOB:  1961/01/09 Patient phone:  [phone] (home)  Patient address:   [place] [address],  Total Time spent with patient: 45 minutes  Date of Admission:  01/22/2019 Date of Discharge: January 29, 2019  Reason for Admission: Admitted to the hospital once again describing suicidal thoughts with plan.  Very depressed and anxious mood.  Principal Problem: MDD (major depressive disorder), recurrent episode, severe ([place]) Discharge Diagnoses: Principal Problem:   MDD (major depressive disorder), recurrent episode, severe ([place]) Active Problems:   Hypothyroidism   HTN (hypertension)   GERD (gastroesophageal reflux disease)   Type 1 diabetes mellitus ([place])   Tracheostomy in place  [facility])   Obsessive compulsive disorder   Past Psychiatric History: History of chronic depression and multiple suicide attempts in the past.  Poor response to medication recently.  Past Medical History:  Past Medical History:  Diagnosis Date  . Depression   . Diabetes (HCC)    Insulin Pump  . Diabetes mellitus type I ([place])   . Diabetes mellitus without complication ([place])   . GERD (gastroesophageal reflux disease)   . H/O laryngectomy   . Heel bone fracture   . Hyperlipidemia   . Hypertension   . Radicular pain of right lower extremity   . Stroke ([place])   . Suicide attempt [facility]) 2014   damaged larynx - tracheostomy  . Thyroid disease     Past Surgical History:  Procedure Laterality Date  . COLONOSCOPY WITH PROPOFOL N/A 05/15/2018   Procedure: COLONOSCOPY WITH PROPOFOL;  Surgeon: [name], [name], MD;  Location: [facility] ENDOSCOPY;  Service: Gastroenterology;  Laterality: N/A;  . ESOPHAGOGASTRODUODENOSCOPY N/A 05/15/2018   Procedure: ESOPHAGOGASTRODUODENOSCOPY (EGD);  Surgeon: [name], [name], MD;  Location: [facility] ENDOSCOPY;  Service: Gastroenterology;  Laterality: N/A;  . FRACTURE SURGERY     . Heel bone reconstruction Left   . HERNIA REPAIR  [id number]   Umbilical hernia repair   . LARYNGECTOMY    . NECK SURGERY     fusion  . SPINE SURGERY    . TRACHEOSTOMY  2014   from SI attempt   Family History:  Family History  Problem Relation Age of Onset  . Osteoporosis Mother   . Diabetes Mother   . Hypertension Father   . Mental illness Neg Hx    Family Psychiatric  History: See previous Social History:  Social History   Substance and Sexual Activity  Alcohol Use Yes  . Alcohol/week: 2.0 standard drinks  . Types: 2 Shots of liquor per week   Comment: rare     Social History   Substance and Sexual Activity  Drug Use No   Comment: Pt denied; UDS not available    Social History   Socioeconomic History  . Marital status: Married    Spouse name: Not on file  . Number of children: Not on file  . Years of education: Not on file  . Highest education level: Not on file  Occupational History  . Not on file  Social Needs  . Financial resource strain: Not on file  . Food insecurity    Worry: Not on file    Inability: Not on file  . Transportation needs    Medical: Not on file    Non-medical: Not on file  Tobacco Use  . Smoking status: Former Smoker    Packs/day: 0.00    Types: Cigarettes  Quit date: 04/09/2013    Years since quitting: 5.8  . Smokeless tobacco: Never Used  Substance and Sexual Activity  . Alcohol use: Yes    Alcohol/week: 2.0 standard drinks    Types: 2 Shots of liquor per week    Comment: rare  . Drug use: No    Comment: Pt denied; UDS not available  . Sexual activity: Yes    Partners: Female    Birth control/protection: Condom  Lifestyle  . Physical activity    Days per week: Not on file    Minutes per session: Not on file  . Stress: Not on file  Relationships  . Social Herbalist on phone: Not on file    Gets together: Not on file    Attends religious service: Not on file    Active member of club or organization: Not  on file    Attends meetings of clubs or organizations: Not on file    Relationship status: Not on file  Other Topics Concern  . Not on file  Social History Narrative  . Not on file    Hospital Course: Admitted to the psychiatric unit.  15-minute checks employed for safety.  Patient did not display dangerous behavior on the unit.  He was cooperative with individual and group treatment.  Medications were adjusted slightly but as the patient continued to have his severe depression with some suicidal thought it was suggested that we try ECT.  Patient was agreeable and on Monday he had an ECT treatment.  The treatment itself went well but unfortunately his tracheostomy proved to be very difficult to manage and he had a lot of trouble with secretions and respiration.  Patient chose not to repeat treatment however after that he seem brighter and calmer and his affect.  Denied feeling hopeless denied suicidal ideation.  Patient received extensive individual and group counseling and therapy and will be referred for outpatient follow-up to continue on current medicine and therapy.  At the time of discharge denying any suicidal thought.  Physical Findings: AIMS:  , ,  ,  ,    CIWA:    COWS:     Musculoskeletal: Strength & Muscle Tone: within normal limits Gait & Station: normal Patient leans: N/A  Psychiatric Specialty Exam: Physical Exam  Nursing note and vitals reviewed. Constitutional: He appears well-developed and well-nourished.  HENT:  Head: Normocephalic and atraumatic.  Eyes: Pupils are equal, round, and reactive to light. Conjunctivae are normal.  Neck: Normal range of motion.  Cardiovascular: Regular rhythm and normal heart sounds.  Respiratory: Effort normal.  GI: Soft.  Musculoskeletal: Normal range of motion.  Neurological: He is alert.  Skin: Skin is warm and dry.  Psychiatric: He has a normal mood and affect. His speech is normal and behavior is normal. Judgment and thought  content normal. Cognition and memory are normal.    Review of Systems  Constitutional: Negative.   HENT: Negative.   Eyes: Negative.   Respiratory: Negative.   Cardiovascular: Negative.   Gastrointestinal: Negative.   Musculoskeletal: Negative.   Skin: Negative.   Neurological: Negative.   Psychiatric/Behavioral: Negative.     Blood pressure (!) 160/77, pulse 80, temperature 98 F (36.7 C), temperature source Oral, resp. rate 18, height 6' (1.829 m), weight 111 kg, SpO2 97 %.Body mass index is 33.19 kg/m.  General Appearance: Casual  Eye Contact:  Good  Speech:  Slow  Volume:  Decreased  Mood:  Dysphoric  Affect:  Congruent  Thought Process:  Coherent  Orientation:  Full (Time, Place, and Person)  Thought Content:  Logical  Suicidal Thoughts:  No  Homicidal Thoughts:  No  Memory:  Immediate;   Fair Recent;   Fair Remote;   Fair  Judgement:  Fair  Insight:  Fair  Psychomotor Activity:  Decreased  Concentration:  Concentration: Fair  Recall:  [place] of Knowledge:  Fair  Language:  Fair  Akathisia:  No  Handed:  Right  AIMS (if indicated):     Assets:  Desire for Improvement Housing  ADL's:  Intact  Cognition:  WNL  Sleep:  Number of Hours: 7.15        Has this patient used any form of tobacco in the last 30 days? (Cigarettes, Smokeless Tobacco, Cigars, and/or Pipes) Yes, Yes, A prescription for an FDA-approved tobacco cessation medication was offered at discharge and the patient refused  Blood Alcohol level:  Lab Results  Component Value Date   [facility] <10 01/21/2019   ETH <10 [id number]    Metabolic Disorder Labs:  Lab Results  Component Value Date   HGBA1C 7.8 (H) 01/01/2019   MPG 177.16 01/01/2019   MPG 174.29 12/23/2018   No results found for: PROLACTIN Lab Results  Component Value Date   CHOL 161 09/15/2016   TRIG 190 (H) 09/15/2016   HDL 41 09/15/2016   CHOLHDL 3.9 09/15/2016   VLDL 38 09/15/2016   [place] 82 09/15/2016    See  Psychiatric Specialty Exam and Suicide Risk Assessment completed by Attending Physician prior to discharge.  Discharge destination:  Home  Is patient on multiple antipsychotic therapies at discharge:  No   Has Patient had three or more failed trials of antipsychotic monotherapy by history:  No  Recommended Plan for Multiple Antipsychotic Therapies: NA  Discharge Instructions    Diet - low sodium heart healthy   Complete by: As directed    Increase activity slowly   Complete by: As directed      Allergies as of 01/29/2019      Reactions   Buspar [buspirone]    Makes the patient "flip out"   Depakote [valproic Acid]    Causes excessive drowsiness   Clopidogrel Rash   Gabapentin Itching, Rash      Medication List    STOP taking these medications   albuterol 1.25 MG/3ML nebulizer solution Commonly known as: ACCUNEB   fluticasone 50 MCG/ACT nasal spray Commonly known as: FLONASE   hydrochlorothiazide 25 MG tablet Commonly known as: HYDRODIURIL   losartan 100 MG tablet Commonly known as: COZAAR   losartan-hydrochlorothiazide 100-25 MG tablet Commonly known as: HYZAAR   NovoLOG 100 UNIT/ML injection Generic drug: insulin aspart   pantoprazole 40 MG tablet Commonly known as: PROTONIX   simvastatin 20 MG tablet Commonly known as: ZOCOR   traMADol 50 MG tablet Commonly known as: ULTRAM     TAKE these medications     Indication  amLODipine 5 MG tablet Commonly known as: NORVASC Take 1 tablet (5 mg total) by mouth daily.  Indication: High Blood Pressure Disorder   aspirin EC 81 MG tablet Take 1 tablet (81 mg total) by mouth daily.  Indication: Inflammation   buPROPion 150 MG 24 hr tablet Commonly known as: WELLBUTRIN XL Take 1 tablet (150 mg total) by mouth daily.  Indication: Major Depressive Disorder   citalopram 40 MG tablet Commonly known as: CELEXA Take 1 tablet (40 mg total) by mouth daily. What changed:   medication strength  how much to  take  Indication: Depression   empagliflozin 25 MG Tabs tablet Commonly known as: JARDIANCE Take 25 mg by mouth daily.  Indication: Type 2 Diabetes   levothyroxine 75 MCG tablet Commonly known as: SYNTHROID Take 1 tablet (75 mcg total) by mouth daily at 6 (six) AM.  Indication: Underactive Thyroid   liothyronine 5 MCG tablet Commonly known as: CYTOMEL Take 2 tablets (10 mcg total) by mouth daily.  Indication: Depression   QUEtiapine 25 MG tablet Commonly known as: SEROQUEL Take 1 tablet (25 mg total) by mouth every 4 (four) hours as needed (anxiety). What changed:   medication strength  how much to take  when to take this  reasons to take this  Indication: Agitation   QUEtiapine 50 MG tablet Commonly known as: SEROQUEL Take 1 tablet (50 mg total) by mouth at bedtime. What changed: You were already taking a medication with the same name, and this prescription was added. Make sure you understand how and when to take each.  Indication: Depressive Phase of Manic-Depression   traZODone 100 MG tablet Commonly known as: DESYREL Take 1 tablet (100 mg total) by mouth at bedtime as needed for sleep. What changed:   when to take this  reasons to take this  Indication: [place] Follow up.   Why: Your nurse Levada Dy was unable to schedule an appointment, she or someone from their office will follow up with you on 9/23 Contact information: [place], [place] [address] Phone [phone] Fax: [phone]            Follow-up recommendations:  Activity:  Activity as tolerated Diet:  Regular diet Other:  Follow-up with outpatient treatment through [facility]  Comments: Prescriptions given at discharge  Signed: Alethia Berthold, MD 01/29/2019, 6:42 PM

## 2019-01-29 NOTE — Progress Notes (Signed)
  [facility] Adult Case Management Discharge Plan :  Will you be returning to the same living situation after discharge:  Yes,  pt is returning home. At discharge, do you have transportation home?: Yes,  wife will provide transportation Do you have the ability to pay for your medications: Yes,  [employer]  Release of information consent forms completed and in the chart;  Patient's signature needed at discharge.  Patient to Follow up at: Follow-up Information    [facility] Follow up.   Why: Your nurse Levada Dy was unable to schedule an appointment, she or someone from their office will follow up with you on 9/23 Contact information: [place], [place] [address] Phone [phone] Fax: [phone]            Next level of care provider has access to [place] and Suicide Prevention discussed: Yes,  SPE completed with the patient's wife     Has patient been referred to the Quitline?: Patient refused referral  Patient has been referred for addiction treatment: Pt. refused referral  Rozann Lesches, LCSW 01/29/2019, 9:56 AM

## 2019-01-29 NOTE — Plan of Care (Signed)
  Problem: Group Participation Goal: STG - Patient will engage in groups without prompting or encouragement from LRT x3 group sessions within 5 recreation therapy group sessions Description: STG - Patient will engage in groups without prompting or encouragement from LRT x3 group sessions within 5 recreation therapy group sessions 01/29/2019 1206 by Ernest Haber, LRT Outcome: Adequate for Discharge 01/29/2019 1206 by Ernest Haber, LRT Outcome: Adequate for Discharge

## 2019-01-29 NOTE — Progress Notes (Signed)
D -Patient was in his room upon arrival to the unit. Patient was pleasant during assessment. Patient denies SI/HI/AVH, pain.Patient said his anxiety and depression are getting a lot better.Patient said he was feeling better since he was back on his medications. Patient was observed interacting appropriately with staff and peers on the unit.Patient stated he was not going to participate in ECT treatment any more.   A - Patient compliant with medication administration per MD orders. Patient oriented to his room and the unit. Patient given education. Patient given support and encouragement to be active in his treatment plan. Patient informed to let staff know if there are any issues or problems on the unit.   R - Patient being monitored Q 15 minutes for safety per unit protocol. Patient remains safe on the unit.

## 2019-01-29 NOTE — Progress Notes (Signed)
Recreation Therapy Notes  Date: 01/29/2019  Time: 9:30 am   Location: Craft room  Behavioral response: Appropriate   Intervention Topic: Self-care  Discussion/Intervention:  Group content today was focused on Self-Care. The group defined self-care and some positive ways they care for themselves. Individuals expressed ways and reasons why they neglected any self-care in the past. Patients described ways to improve self-care in the future. The group explained what could happen if they did not do any self-care activities at all. The group participated in the intervention "self-care assessment" where they had a chance to discover some of their weaknesses and strengths in self- care. Patient came up with a self-care plan to improve themselves in the future.  Clinical Observations/Feedback:  Patient came to group and explained that he could improve his self-care by making grocery to eat healthy. Individual was social with peers and staff while participating in the intervention    Stevee Valenta LRT/CTRS         [place] 01/29/2019 11:46 AM

## 2019-01-29 NOTE — Progress Notes (Signed)
Patient ID: [name], male   DOB: [birth year], 58 y.o.   MRN: [id number]  Discharge Note:  Patient denies SI/HI/AVH at this time. Discharge instructions, AVS, prescriptions, and transition record gone over with patient. Patient agrees to comply with medication management, follow-up visit, and outpatient therapy. Patient belongings returned to patient. Patient questions and concerns addressed and answered. Patient ambulatory off unit. Patient discharged to home with wife.

## 2019-01-29 NOTE — BHH Counselor (Signed)
CSW spoke with Levada Dy a nurse at [facility] in an effort to get the patient's aftercare appointment scheduled. Levada Dy reports that she is unable to schedule the patient due to schedule constraints between the mental health professional, NP and pt's schedule.  She reports that they will follow up with the patient on 01/29/2019.  She reiterated that she was unable to provide a time.  Assunta Curtis, MSW, LCSW 01/29/2019 9:56 AM

## 2019-01-29 NOTE — Progress Notes (Signed)
Recreation Therapy Notes  INPATIENT RECREATION TR PLAN  Patient Details Name: [name] MRN: [id number] DOB: 06/19/1960 Today's Date: 01/29/2019  Rec Therapy Plan Is patient appropriate for Therapeutic Recreation?: Yes Treatment times per week: At least 3 Estimated Length of Stay: 5-7 days TR Treatment/Interventions: Group participation (Comment)  Discharge Criteria Pt will be discharged from therapy if:: Discharged Treatment plan/goals/alternatives discussed and agreed upon by:: Patient/family  Discharge Summary Short term goals set: Patient will engage in groups without prompting or encouragement from LRT x3 group sessions within 5 recreation therapy group sessions Short term goals met: Adequate for discharge Progress toward goals comments: Groups attended Which groups?: Leisure education, Other (Comment)(Self-care) Reason goals not met: N/A Therapeutic equipment acquired: N/A Reason patient discharged from therapy: Discharge from hospital Pt/family agrees with progress & goals achieved: Yes Date patient discharged from therapy: 01/29/19      01/29/2019, 12:05 PM  

## 2019-01-29 NOTE — BHH Suicide Risk Assessment (Signed)
[facility] Discharge Suicide Risk Assessment   Principal Problem: MDD (major depressive disorder), recurrent episode, severe ([place]) Discharge Diagnoses: Principal Problem:   MDD (major depressive disorder), recurrent episode, severe ([place]) Active Problems:   Hypothyroidism   HTN (hypertension)   GERD (gastroesophageal reflux disease)   Type 1 diabetes mellitus ([place])   Tracheostomy in place ([place])   Obsessive compulsive disorder   Total Time spent with patient: 30 minutes  Musculoskeletal: Strength & Muscle Tone: within normal limits Gait & Station: normal Patient leans: N/A  Psychiatric Specialty Exam: Review of Systems  Constitutional: Negative.   HENT: Negative.   Eyes: Negative.   Respiratory: Negative.   Cardiovascular: Negative.   Gastrointestinal: Negative.   Musculoskeletal: Negative.   Skin: Negative.   Neurological: Negative.   Psychiatric/Behavioral: Negative.     Blood pressure (!) 151/83, pulse 70, temperature 98.1 F (36.7 C), temperature source Oral, resp. rate 18, height 6' (1.829 m), weight 111 kg, SpO2 97 %.Body mass index is 33.19 kg/m.  General Appearance: Casual  Eye Contact::  Fair  Speech:  Slow409  Volume:  Decreased  Mood:  Euthymic  Affect:  Congruent  Thought Process:  Coherent  Orientation:  Full (Time, Place, and Person)  Thought Content:  Logical  Suicidal Thoughts:  No  Homicidal Thoughts:  No  Memory:  Immediate;   Fair Recent;   Fair Remote;   Fair  Judgement:  Fair  Insight:  Fair  Psychomotor Activity:  Normal  Concentration:  Fair  Recall:  [employer] of [place]  Language: Fair  Akathisia:  No  Handed:  Right  AIMS (if indicated):     Assets:  Communication Skills Desire for Improvement Financial Resources/Insurance Housing Resilience Social Support  Sleep:  Number of Hours: 7.15  Cognition: WNL  ADL's:  Intact   Mental Status Per Nursing Assessment::   On Admission:  Suicidal ideation indicated by  patient  Demographic Factors:  Male, Caucasian and Unemployed  Loss Factors: Decline in physical health  Historical Factors: Prior suicide attempts  Risk Reduction Factors:   Sense of responsibility to family, Living with another person, especially a relative, Positive social support and Positive therapeutic relationship  Continued Clinical Symptoms:  Depression:   Impulsivity  Cognitive Features That Contribute To Risk:  None    Suicide Risk:  Minimal: No identifiable suicidal ideation.  Patients presenting with no risk factors but with morbid ruminations; may be classified as minimal risk based on the severity of the depressive symptoms  Follow-up Information    [facility] Follow up.   Why: Please follow up with Contact information: [place], [place] [address] Phone [phone] Fax: [phone]            Plan Of Care/Follow-up recommendations:  Activity:  Activity as tolerated Diet:  Diabetic diet Other:  Follow-up with [facility].  Alethia Berthold, MD 01/29/2019, 9:14 AM

## 2019-01-30 DIAGNOSIS — E1029 Type 1 diabetes mellitus with other diabetic kidney complication: Secondary | ICD-10-CM | POA: Diagnosis not present

## 2019-01-30 DIAGNOSIS — E1159 Type 2 diabetes mellitus with other circulatory complications: Secondary | ICD-10-CM | POA: Diagnosis not present

## 2019-01-30 DIAGNOSIS — E1069 Type 1 diabetes mellitus with other specified complication: Secondary | ICD-10-CM | POA: Diagnosis not present

## 2019-01-30 DIAGNOSIS — S8264XA Nondisplaced fracture of lateral malleolus of right fibula, initial encounter for closed fracture: Secondary | ICD-10-CM | POA: Diagnosis not present

## 2019-01-30 DIAGNOSIS — E785 Hyperlipidemia, unspecified: Secondary | ICD-10-CM | POA: Diagnosis not present

## 2019-01-30 DIAGNOSIS — R809 Proteinuria, unspecified: Secondary | ICD-10-CM | POA: Diagnosis not present

## 2019-01-30 DIAGNOSIS — I10 Essential (primary) hypertension: Secondary | ICD-10-CM | POA: Diagnosis not present

## 2019-02-06 DIAGNOSIS — Z930 Tracheostomy status: Secondary | ICD-10-CM | POA: Diagnosis not present

## 2019-02-06 DIAGNOSIS — Z430 Encounter for attention to tracheostomy: Secondary | ICD-10-CM | POA: Diagnosis not present

## 2019-02-10 ENCOUNTER — Ambulatory Visit: Payer: PPO | Admitting: Psychiatry

## 2019-02-10 ENCOUNTER — Encounter: Payer: Self-pay | Admitting: Psychiatry

## 2019-02-10 ENCOUNTER — Ambulatory Visit (INDEPENDENT_AMBULATORY_CARE_PROVIDER_SITE_OTHER): Payer: PPO | Admitting: Psychiatry

## 2019-02-10 ENCOUNTER — Other Ambulatory Visit: Payer: Self-pay

## 2019-02-10 DIAGNOSIS — F411 Generalized anxiety disorder: Secondary | ICD-10-CM

## 2019-02-10 DIAGNOSIS — F332 Major depressive disorder, recurrent severe without psychotic features: Secondary | ICD-10-CM

## 2019-02-10 NOTE — Progress Notes (Signed)
Virtual Visit via Video Note  I connected with [name] on 02/10/19 at  3:00 PM EDT by a video enabled telemedicine application and verified that I am speaking with the correct person using two identifiers.   I discussed the limitations of evaluation and management by telemedicine and the availability of in person appointments. The patient expressed understanding and agreed to proceed.   I discussed the assessment and treatment plan with the patient. The patient was provided an opportunity to ask questions and all were answered. The patient agreed with the plan and demonstrated an understanding of the instructions.   The patient was advised to call back or seek an in-person evaluation if the symptoms worsen or if the condition fails to improve as anticipated.  [place] MD OP Progress Note  02/10/2019 5:15 PM [name]  MRN:  [id number]  Chief Complaint:  Chief Complaint    Follow-up     HPI: [name] is a 58 year old Caucasian male, married, lives in [place], has a history of depression, anxiety, multiple medical problems, history of tracheostomy, status post multiple intubation all during one hospitalization post acute hypoxemic respiratory failure due to opioid overdose, history of CVA with right-sided hemiparesis, diabetes, hypertension, NSTEMI, hypothyroidism, hyperlipidemia, chronic pain was evaluated by telemedicine today.  Patient was seen by writer on 01/15/2019 -for initial visit.  However soon after that patient got readmitted to the hospital at [facility] on 01/23/2019.  Patient at that time had worsening depression and suicidal thoughts.  Patient was admitted to the behavioral health unit until 01/29/2019.  I have reviewed the following medical records in [facility] per Dr. Weber Cooks- ' dated 01/23/2019-01/29/2019-' patient was admitted to the unit.  Patient did not display dangerous behaviors on unit.  Medications were readjusted.  Patient continued to have  severe depression with some suicidal thoughts and it was suggested that we try ECT.  Patient was agreeable and on Monday he had an ECT treatment.  Treatment itself went well but unfortunately his tracheostomy proved to be very difficult to manage and he had a lot of trouble with secretions and respiration.  Patient continued to improve on medications and at the time of discharge denied any suicidal thoughts.'  Patient today reports he continues to make progress.  He reports he is better than how he was when he went to the hospital.  He however continues to struggle with sadness, lack of energy, sleep problems.  He reports he is currently on Seroquel low dosage and trazodone for sleep.  He however continues to have trouble falling asleep.  Patient reports he has not been able to connect with the therapist yet.  [facility].  Patient continues to have good support system from his family.  He currently denies any suicidality.  Patient was started on Cytomel during this admission.  Patient however reports he also takes levothyroxine.  He wonders whether he needs to be on both together.  Advised him to get his thyroid panel done.  He reports he has already done it recently.  He will requested from his primary care.  Discussed with patient about TMS-we will send referral to the coordinator. Visit Diagnosis:    ICD-10-CM   1. Severe episode of recurrent major depressive disorder, without psychotic features ([place])  F33.2   2. GAD (generalized anxiety disorder)  F41.1     Past Psychiatric History: I have reviewed past psychiatric history from my progress note on 01/15/2019.  Past  trials of fluvoxamine, Rexulti, Xanax, Abilify.  Patient with multiple inpatient mental health admissions.  Most recently he had back-to-back inpatient mental health admission-August 2020 and recently from September 17-September 23.  Past Medical History:  Past Medical History:  Diagnosis Date  .  Depression   . Diabetes (HCC)    Insulin Pump  . Diabetes mellitus type I ([place])   . Diabetes mellitus without complication ([place])   . GERD (gastroesophageal reflux disease)   . H/O laryngectomy   . Heel bone fracture   . Hyperlipidemia   . Hypertension   . Radicular pain of right lower extremity   . Stroke ([place])   . Suicide attempt [facility]) 2014   damaged larynx - tracheostomy  . Thyroid disease     Past Surgical History:  Procedure Laterality Date  . COLONOSCOPY WITH PROPOFOL N/A 05/15/2018   Procedure: COLONOSCOPY WITH PROPOFOL;  Surgeon: [name], [name], MD;  Location: [facility] ENDOSCOPY;  Service: Gastroenterology;  Laterality: N/A;  . ESOPHAGOGASTRODUODENOSCOPY N/A 05/15/2018   Procedure: ESOPHAGOGASTRODUODENOSCOPY (EGD);  Surgeon: [name], [name], MD;  Location: [facility] ENDOSCOPY;  Service: Gastroenterology;  Laterality: N/A;  . FRACTURE SURGERY    . Heel bone reconstruction Left   . HERNIA REPAIR  [id number]   Umbilical hernia repair   . LARYNGECTOMY    . NECK SURGERY     fusion  . SPINE SURGERY    . TRACHEOSTOMY  2014   from [place] attempt    Family Psychiatric History: I have reviewed family psychiatric history from my progress note on 01/15/2019 Family History:  Family History  Problem Relation Age of Onset  . Osteoporosis Mother   . Diabetes Mother   . Hypertension Father   . Mental illness Neg Hx     Social History: I have reviewed social history from my progress note on 01/15/2019 Social History   Socioeconomic History  . Marital status: Married    Spouse name: Not on file  . Number of children: Not on file  . Years of education: Not on file  . Highest education level: Not on file  Occupational History  . Not on file  Social Needs  . Financial resource strain: Not on file  . Food insecurity    Worry: Not on file    Inability: Not on file  . Transportation needs    Medical: Not on file    Non-medical: Not on file  Tobacco Use  . Smoking status: Former Smoker     Packs/day: 0.00    Types: Cigarettes    Quit date: 04/09/2013    Years since quitting: 5.8  . Smokeless tobacco: Never Used  Substance and Sexual Activity  . Alcohol use: Yes    Alcohol/week: 2.0 standard drinks    Types: 2 Shots of liquor per week    Comment: rare  . Drug use: No    Comment: Pt denied; UDS not available  . Sexual activity: Yes    Partners: Female    Birth control/protection: Condom  Lifestyle  . Physical activity    Days per week: Not on file    Minutes per session: Not on file  . Stress: Not on file  Relationships  . Social Herbalist on phone: Not on file    Gets together: Not on file    Attends religious service: Not on file    Active member of club or organization: Not on file    Attends meetings of clubs or organizations: Not on  file    Relationship status: Not on file  Other Topics Concern  . Not on file  Social History Narrative  . Not on file    Allergies:  Allergies  Allergen Reactions  . Buspar [Buspirone]     Makes the patient "flip out"  . Depakote [Valproic Acid]     Causes excessive drowsiness  . Clopidogrel Rash  . Gabapentin Itching and Rash    Metabolic Disorder Labs: Lab Results  Component Value Date   HGBA1C 7.8 (H) 01/01/2019   MPG 177.16 01/01/2019   MPG 174.29 12/23/2018   No results found for: PROLACTIN Lab Results  Component Value Date   CHOL 161 09/15/2016   TRIG 190 (H) 09/15/2016   HDL 41 09/15/2016   CHOLHDL 3.9 09/15/2016   VLDL 38 09/15/2016   LDLCALC 82 09/15/2016   Lab Results  Component Value Date   TSH 1.952 09/15/2016    Therapeutic Level Labs: No results found for: LITHIUM Lab Results  Component Value Date   VALPROATE 19 (L) 09/12/2016   No components found for:  CBMZ  Current Medications: Current Outpatient Medications  Medication Sig Dispense Refill  . insulin aspart (NOVOLOG) 100 UNIT/ML injection INJECT UP TO 120 UNITS UNDER THE SKIN AS DIRECTED DAILY VIA INSULIN PUMP    .  amLODipine (NORVASC) 5 MG tablet Take 1 tablet (5 mg total) by mouth daily. 30 tablet 1  . aspirin EC 81 MG tablet Take 1 tablet (81 mg total) by mouth daily. 30 tablet 1  . buPROPion (WELLBUTRIN XL) 150 MG 24 hr tablet Take 1 tablet (150 mg total) by mouth daily. 30 tablet 1  . citalopram (CELEXA) 40 MG tablet Take 1 tablet (40 mg total) by mouth daily. 30 tablet 1  . empagliflozin (JARDIANCE) 25 MG TABS tablet Take 25 mg by mouth daily. 30 tablet 1  . hydrochlorothiazide (HYDRODIURIL) 25 MG tablet hydrochlorothiazide 25 mg tablet    . insulin aspart (NOVOLOG) 100 UNIT/ML injection INJECT UP TO 120 UNITS UNDER THE SKIN UTD D VIA INSULIN PUMP    . levothyroxine (SYNTHROID) 75 MCG tablet Take 1 tablet (75 mcg total) by mouth daily at 6 (six) AM. 30 tablet 1  . liothyronine (CYTOMEL) 5 MCG tablet Take 2 tablets (10 mcg total) by mouth daily. 60 tablet 1  . QUEtiapine (SEROQUEL) 25 MG tablet Take 1 tablet (25 mg total) by mouth every 4 (four) hours as needed (anxiety). 60 tablet 1  . QUEtiapine (SEROQUEL) 50 MG tablet Take 1 tablet (50 mg total) by mouth at bedtime. 30 tablet 1  . traMADol (ULTRAM) 50 MG tablet tramadol 50 mg tablet    . traZODone (DESYREL) 100 MG tablet Take 1 tablet (100 mg total) by mouth at bedtime as needed for sleep. 30 tablet 1   No current facility-administered medications for this visit.      Musculoskeletal: Strength & Muscle Tone: UTA Gait & Station: Observed as seated Patient leans: N/A  Psychiatric Specialty Exam: Review of Systems  Psychiatric/Behavioral: Positive for depression. The patient is nervous/anxious and has insomnia.   All other systems reviewed and are negative.   There were no vitals taken for this visit.There is no height or weight on file to calculate BMI.  General Appearance: Casual  Eye Contact:  Fair  Speech:  Slow has tracheostomy   Volume:  Normal  Mood:  Anxious and Depressed  Affect:  Appropriate  Thought Process:  Goal Directed and  Descriptions of Associations: Intact  Orientation:  Full (Time, Place, and Person)  Thought Content: Logical   Suicidal Thoughts:  No  Homicidal Thoughts:  No  Memory:  Immediate;   Fair Recent;   Fair Remote;   Fair  Judgement:  Fair  Insight:  Fair  Psychomotor Activity:  Normal  Concentration:  Concentration: Fair and Attention Span: Fair  Recall:  [employer] of Knowledge: Fair  Language: Fair  Akathisia:  No  Handed:  Right  AIMS (if indicated): UTA  Assets:  Communication Skills Desire for Improvement Social Support  ADL's:  Intact  Cognition: WNL  Sleep:  restless   Screenings: AUDIT     Admission (Discharged) from 01/22/2019 in [place] Admission (Discharged) from 12/31/2018 in [place] Admission (Discharged) from 09/14/2016 in [place]  Alcohol Use Disorder Identification Test Final Score (AUDIT)  0  0  1    ECT-MADRS     Admission (Discharged) from 01/22/2019 in [place] Total Score  27    Mini-Mental     Admission (Discharged) from 01/22/2019 in [place]  Total Score (max 30 points )  30    PHQ2-9     Office Visit from 02/10/2019 in [place] Patient Outreach Telephone from 12/12/2017 in [place] from 08/22/2016 in [place]  PHQ-2 Total Score  3  0  2  PHQ-9 Total Score  13  -  4       Assessment and Plan: [name] is a 58 year old Caucasian male, married, disability, has a history of multiple medical problems, ADHD, MDD was evaluated by telemedicine today.  Patient is biologically predisposed given his multiple health problems.  He also has psychosocial stressors including his physical limitations, chronic mental health problems, current pandemic.  Patient recently had 2 inpatient mental health admissions back-to-back.  He was recently discharged on  01/29/2019 for worsening depression with suicidal ideation.  Plan MDD-some progress Celexa 40 mg p.o. daily Seroquel 50 mg p.o. nightly Patient advised to take Seroquel 25 mg p.o. nightly as needed for sleep Trazodone 100 mg p.o. nightly Wellbutrin 75 mg p.o. daily Continue Cytomel as prescribed. PHQ 9 - 13  For GAD-unstable Celexa as prescribed Patient advised to start psychotherapy sessions. He will reach out to [facility]. Patient is on Seroquel 25 mg p.o. 4 times a day as needed for severe anxiety symptoms initiated during his most recent IP admission.  Have reviewed medical records per most recent discharge summary dated 01/29/2019 per Dr. Weber Cooks as summarized above.  Patient advised to get thyroid panel done.  He will reach out to his primary care provider  We will refer patient for [place] since he did not tolerate ECT treatments.  Follow-up in clinic in 4 weeks or sooner if needed.  November 5 at 1:30 PM  I have spent atleast 25 minutes non face to face with patient today. More than 50 % of the time was spent for psychoeducation and supportive psychotherapy and care coordination.  This note was generated in part or whole with voice recognition software. Voice recognition is usually quite accurate but there are transcription errors that can and very often do occur. I apologize for any typographical errors that were not detected and corrected.            Ursula Alert, MD 02/10/2019, 5:15 PM

## 2019-02-11 ENCOUNTER — Telehealth: Payer: Self-pay | Admitting: Psychiatry

## 2019-02-11 NOTE — Telephone Encounter (Signed)
Received labs from [facility] -  CBC - wnl except for MPV - low at 8.3 Glucose - elevated at 192 BUN - high at 35  Creatinine - high at 1.8 GFR - 39  AST/ALT- WNL Alkaline phosphatase - elevated at 135 Hba1c- 8 - elevated   Lipid panel  Cholestrol -0 high at 219 Triglycerides - high at 253 Otherwise WNL  TSH - 7.409 - HIGH   Resulted 01/16/2019

## 2019-02-14 DIAGNOSIS — E1029 Type 1 diabetes mellitus with other diabetic kidney complication: Secondary | ICD-10-CM | POA: Diagnosis not present

## 2019-02-14 DIAGNOSIS — R809 Proteinuria, unspecified: Secondary | ICD-10-CM | POA: Diagnosis not present

## 2019-02-17 DIAGNOSIS — E1029 Type 1 diabetes mellitus with other diabetic kidney complication: Secondary | ICD-10-CM | POA: Diagnosis not present

## 2019-02-17 DIAGNOSIS — R809 Proteinuria, unspecified: Secondary | ICD-10-CM | POA: Diagnosis not present

## 2019-02-18 DIAGNOSIS — J4530 Mild persistent asthma, uncomplicated: Secondary | ICD-10-CM | POA: Diagnosis not present

## 2019-02-19 DIAGNOSIS — E1029 Type 1 diabetes mellitus with other diabetic kidney complication: Secondary | ICD-10-CM | POA: Diagnosis not present

## 2019-02-19 DIAGNOSIS — Z430 Encounter for attention to tracheostomy: Secondary | ICD-10-CM | POA: Diagnosis not present

## 2019-02-19 DIAGNOSIS — R809 Proteinuria, unspecified: Secondary | ICD-10-CM | POA: Diagnosis not present

## 2019-02-19 DIAGNOSIS — Z930 Tracheostomy status: Secondary | ICD-10-CM | POA: Diagnosis not present

## 2019-02-20 ENCOUNTER — Encounter (HOSPITAL_COMMUNITY): Payer: Self-pay | Admitting: Psychiatry

## 2019-02-20 ENCOUNTER — Other Ambulatory Visit: Payer: Self-pay

## 2019-02-20 ENCOUNTER — Telehealth (HOSPITAL_COMMUNITY): Payer: Self-pay

## 2019-02-20 ENCOUNTER — Ambulatory Visit (INDEPENDENT_AMBULATORY_CARE_PROVIDER_SITE_OTHER): Payer: PPO | Admitting: Psychiatry

## 2019-02-20 DIAGNOSIS — F411 Generalized anxiety disorder: Secondary | ICD-10-CM

## 2019-02-20 DIAGNOSIS — F332 Major depressive disorder, recurrent severe without psychotic features: Secondary | ICD-10-CM | POA: Diagnosis not present

## 2019-02-20 NOTE — Telephone Encounter (Signed)
Medication management - Telephone call with patient after speaking with Dr. Dwyane Dee to request patient come the [facility] to meet with her today for his initial [place] evaluation and pt agreed with plan. Pt wrote down address for [facility] 1pm meeting.

## 2019-02-20 NOTE — Progress Notes (Signed)
Psychiatric Initial Adult Assessment   Patient Identification: [name] MRN:  [id number] Date of Evaluation:  02/20/2019 Referral Source: Psychiatrist Chief Complaint:   Chief Complaint    Depression; Anxiety; [place] Consult     Visit Diagnosis:    ICD-10-CM   1. Severe episode of recurrent major depressive disorder, without psychotic features ([place])  F33.2   2. GAD (generalized anxiety disorder)  F41.1     History of Present Illness: [name] is a 58 year old Caucasian male, married, lives in [place], has a history of depression, anxiety, multiple medical problems, history of tracheostomy, status post multiple intubation all during one hospitalization post acute hypoxemic respiratory failure due to opioid overdose, history of CVA with right-sided hemiparesis, diabetes, hypertension, NSTEMI, hypothyroidism, hyperlipidemia, chronic pain   Patient reports that he has been struggling with depression for many years, reports it is worsened over the last few months and said he has had multiple recent psychiatric admissions.  Patient reports that psychotropic medications have not helped with his depression and on a scale of 0-10 with 0 being most symptoms and 10 being the worst is depression 6 days of 5 or 7 out of 10.  He has had his frustrated with his life, stays mostly to himself, and does not enjoy activities, is tired a lot, problems with sleep, feels hopeless, worthless.  Patient denies that he is not currently having suicidal thoughts and reports that he has a lot of social supports which include his wife, her kids, his parents.  That he wants to enjoy activities, socialize with others, feel good about himself and so would really like to try [place].  Patient denies that he does not have any symptoms of mania, psychosis.  Associated Signs/Symptoms: Depression Symptoms:  depressed mood, insomnia, hypersomnia, psychomotor retardation, fatigue, feelings of  worthlessness/guilt, difficulty concentrating, hopelessness, anxiety, loss of energy/fatigue, disturbed sleep, weight gain, increased appetite, (Hypo) Manic Symptoms:  Distractibility, Anxiety Symptoms:  Excessive Worry, Psychotic Symptoms:  Hallucinations: None PTSD Symptoms: Negative  Past Psychiatric History:  Patient with past psychiatric history- history of depression since the past several years.  His first admission to the inpatient unit was in 2014.  He had at least 4  suicide attempts in his life.  Patient has tried multiple medications in the past.  He used to be under the care of Dr. Daron Offer in [place].  He may also have been under the care of Dr. Nicolasa Ducking in the past.   Past trials of fluvoxamine, Rexulti, Xanax, Abilify.  Patient with multiple inpatient mental health admissions.  Most recently he had back-to-back inpatient mental health admission-August 2020 and recently from September 17-September 23.      Previous Psychotropic Medications: Yes   Substance Abuse History in the last 12 months:  No.  Consequences of Substance Abuse: NA  Past Medical History:  Past Medical History:  Diagnosis Date  . Depression   . Diabetes (HCC)    Insulin Pump  . Diabetes mellitus type I ([place])   . Diabetes mellitus without complication ([place])   . GERD (gastroesophageal reflux disease)   . H/O laryngectomy   . Heel bone fracture   . Hyperlipidemia   . Hypertension   . Radicular pain of right lower extremity   . Stroke ([place])   . Suicide attempt [facility]) 2014   damaged larynx - tracheostomy  . Thyroid disease     Past Surgical History:  Procedure Laterality Date  . COLONOSCOPY WITH PROPOFOL N/A 05/15/2018   Procedure: COLONOSCOPY WITH PROPOFOL;  Surgeon: [name], [name], MD;  Location: [facility] ENDOSCOPY;  Service: Gastroenterology;  Laterality: N/A;  . ESOPHAGOGASTRODUODENOSCOPY N/A 05/15/2018   Procedure: ESOPHAGOGASTRODUODENOSCOPY (EGD);  Surgeon: [name], [name], MD;  Location:  [facility] ENDOSCOPY;  Service: Gastroenterology;  Laterality: N/A;  . FRACTURE SURGERY    . Heel bone reconstruction Left   . HERNIA REPAIR  [id number]   Umbilical hernia repair   . LARYNGECTOMY    . NECK SURGERY     fusion  . SPINE SURGERY    . TRACHEOSTOMY  2014   from [place] attempt    Family Psychiatric History: none reported  Family History:  Family History  Problem Relation Age of Onset  . Osteoporosis Mother   . Diabetes Mother   . Hypertension Father   . Mental illness Neg Hx     Social History:   Social History   Socioeconomic History  . Marital status: Married    Spouse name: Not on file  . Number of children: Not on file  . Years of education: Not on file  . Highest education level: Not on file  Occupational History  . Not on file  Social Needs  . Financial resource strain: Not on file  . Food insecurity    Worry: Not on file    Inability: Not on file  . Transportation needs    Medical: Not on file    Non-medical: Not on file  Tobacco Use  . Smoking status: Former Smoker    Packs/day: 0.00    Types: Cigarettes    Quit date: 04/09/2013    Years since quitting: 5.8  . Smokeless tobacco: Never Used  Substance and Sexual Activity  . Alcohol use: Yes    Alcohol/week: 2.0 standard drinks    Types: 2 Shots of liquor per week    Comment: rare  . Drug use: No    Comment: Pt denied; UDS not available  . Sexual activity: Yes    Partners: Female    Birth control/protection: Condom  Lifestyle  . Physical activity    Days per week: Not on file    Minutes per session: Not on file  . Stress: Not on file  Relationships  . Social Herbalist on phone: Not on file    Gets together: Not on file    Attends religious service: Not on file    Active member of club or organization: Not on file    Attends meetings of clubs or organizations: Not on file    Relationship status: Not on file  Other Topics Concern  . Not on file  Social History Narrative  . Not on  file    Additional Social History: on disability, is married, supportive family  Allergies:   Allergies  Allergen Reactions  . Buspar [Buspirone]     Makes the patient "flip out"  . Depakote [Valproic Acid]     Causes excessive drowsiness  . Clopidogrel Rash  . Gabapentin Itching and Rash    Metabolic Disorder Labs: Lab Results  Component Value Date   HGBA1C 7.8 (H) 01/01/2019   MPG 177.16 01/01/2019   MPG 174.29 12/23/2018   No results found for: PROLACTIN Lab Results  Component Value Date   CHOL 161 09/15/2016   TRIG 190 (H) 09/15/2016   HDL 41 09/15/2016   CHOLHDL 3.9 09/15/2016   VLDL 38 09/15/2016   LDLCALC 82 09/15/2016   Lab Results  Component Value Date   TSH 1.952 09/15/2016  Therapeutic Level Labs: No results found for: LITHIUM No results found for: CBMZ Lab Results  Component Value Date   VALPROATE 19 (L) 09/12/2016    Current Medications: Current Outpatient Medications  Medication Sig Dispense Refill  . amLODipine (NORVASC) 5 MG tablet Take 1 tablet (5 mg total) by mouth daily. 30 tablet 1  . aspirin EC 81 MG tablet Take 1 tablet (81 mg total) by mouth daily. 30 tablet 1  . buPROPion (WELLBUTRIN XL) 150 MG 24 hr tablet Take 1 tablet (150 mg total) by mouth daily. 30 tablet 1  . citalopram (CELEXA) 40 MG tablet Take 1 tablet (40 mg total) by mouth daily. 30 tablet 1  . empagliflozin (JARDIANCE) 25 MG TABS tablet Take 25 mg by mouth daily. 30 tablet 1  . hydrochlorothiazide (HYDRODIURIL) 25 MG tablet hydrochlorothiazide 25 mg tablet    . insulin aspart (NOVOLOG) 100 UNIT/ML injection INJECT UP TO 120 UNITS UNDER THE SKIN AS DIRECTED DAILY VIA INSULIN PUMP    . insulin aspart (NOVOLOG) 100 UNIT/ML injection INJECT UP TO 120 UNITS UNDER THE SKIN UTD D VIA INSULIN PUMP    . levothyroxine (SYNTHROID) 75 MCG tablet Take 1 tablet (75 mcg total) by mouth daily at 6 (six) AM. 30 tablet 1  . liothyronine (CYTOMEL) 5 MCG tablet Take 2 tablets (10 mcg total)  by mouth daily. 60 tablet 1  . QUEtiapine (SEROQUEL) 25 MG tablet Take 1 tablet (25 mg total) by mouth every 4 (four) hours as needed (anxiety). 60 tablet 1  . QUEtiapine (SEROQUEL) 50 MG tablet Take 1 tablet (50 mg total) by mouth at bedtime. 30 tablet 1  . traMADol (ULTRAM) 50 MG tablet tramadol 50 mg tablet    . traZODone (DESYREL) 100 MG tablet Take 1 tablet (100 mg total) by mouth at bedtime as needed for sleep. 30 tablet 1   No current facility-administered medications for this visit.     Musculoskeletal: Strength & Muscle Tone: within normal limits Gait & Station: normal Patient leans: N/A  Psychiatric Specialty Exam: ROS  There were no vitals taken for this visit.There is no height or weight on file to calculate BMI.  General Appearance: Casual  Eye Contact:  Good  Speech:  Clear and Coherent, Slow and as patient has tracheostomy  Volume:  Normal  Mood:  Depressed, Dysphoric, Hopeless and Worthless  Affect:  Congruent and Constricted  Thought Process:  Coherent, Goal Directed and Descriptions of Associations: Intact  Orientation:  Full (Time, Place, and Person)  Thought Content:  Logical, Hallucinations: None and Rumination  Suicidal Thoughts:  No  Homicidal Thoughts:  No  Memory:  Immediate;   Fair Recent;   Fair Remote;   Fair  Judgement:  Impaired  Insight:  Present  Psychomotor Activity:  [place]  Concentration:  Concentration: Fair and Attention Span: Fair  Recall:  [place]: Fair  Akathisia:  No  Handed:  Right  AIMS (if indicated):  not done  Assets:  Desire for Improvement Financial Resources/Insurance Housing Intimacy Leisure Time Social Support Transportation  ADL's:  Intact  Cognition: WNL  Sleep:  Fair   Screenings: AUDIT     Admission (Discharged) from 01/22/2019 in [place] Admission (Discharged) from 12/31/2018 in [place] Admission (Discharged) from  09/14/2016 in [place]  Alcohol Use Disorder Identification Test Final Score (AUDIT)  0  0  1    ECT-MADRS     Admission (Discharged) from  01/22/2019 in [employer] Total Score  27    Mini-Mental     Admission (Discharged) from 01/22/2019 in [place]  Total Score (max 30 points )  30    PHQ2-9     Office Visit from 02/10/2019 in [place] Patient Outreach Telephone from 12/12/2017 in [place] from 08/22/2016 in [place]  PHQ-2 Total Score  3  0  2  PHQ-9 Total Score  13  -  4      Assessment and Plan: Patient suffers from major depressive disorder, recurrent, severe without psychotic features, generalized anxiety disorder.  Patient reports that he has not had benefit with medications, tried ECT once, could not tolerate it because of his tracheostomy.  Patient's also had multiple psychiatric admissions, feels his depression does not improve and is a candidate for [place].  Discussed in length with him [place], patient and wife feel it is essential for him to be able to function.  Patient does contract for safety at this time  Discussed with patient that he needs to continue his psychotropic medications as prescribed by his psychiatrist Dr. Thomasena Edis  Also to continue to take his medications for his diabetes, and his medications for hyperlipidemia, thyroid disease and GERD.  To continue to follow-up for these medical conditions with his PCP  50% of this visit was spent in discussing with patient different options in regards to treatment of his depression, risks and benefit with medications, patient reports that he suffered from depression for many years, feels his life is not worth living, has had multiple suicide attempts and psychiatric hospitalizations.  Patient cannot tolerate ECT due to his tracheostomy and is a good candidate for [place]   Hampton Abbot, MD 10/15/20202:35 PM

## 2019-02-25 ENCOUNTER — Other Ambulatory Visit: Payer: Self-pay | Admitting: Pharmacist

## 2019-02-25 NOTE — Patient Outreach (Addendum)
[facility]) Care Management  [place] 02/25/2019  Trampis Melhorn May 14, 1960 [id number]  Incoming call from patient's mother to [place] technician with question about assistance with medical office visit co-pays.    Unsuccessful return call placed to patient's mother, Carla Drape.  HIPAA compliant voicemail left.   Plan: Will f/u again with patient's mother in 3-4 days if I have not heard back yet  Ralene Bathe, PharmD, [place] [phone]    Addendum: Incoming call from patient's mother.  She clarifies that she is trying to find help paying for patient's hospital bills after recent stay at [facility].  We reviewed that she may be able to have bills put on a payment plan but I am not aware of any assistance otherwise.  I suggested that she look closely at best Medicare plan for 2021 with coverage and provided patient with contact information for [facility] as it is currently open enrollment.  Patient's mother also is meeting tomorrow with insurance agent to review plans for next year.  No further questions at this time.   Ralene Bathe, PharmD, [place] [phone]

## 2019-02-27 ENCOUNTER — Ambulatory Visit: Payer: PPO | Admitting: Pharmacist

## 2019-03-04 DIAGNOSIS — J449 Chronic obstructive pulmonary disease, unspecified: Secondary | ICD-10-CM | POA: Diagnosis not present

## 2019-03-05 ENCOUNTER — Other Ambulatory Visit: Payer: Self-pay

## 2019-03-05 ENCOUNTER — Ambulatory Visit (INDEPENDENT_AMBULATORY_CARE_PROVIDER_SITE_OTHER): Payer: PPO | Admitting: Psychiatry

## 2019-03-05 DIAGNOSIS — F332 Major depressive disorder, recurrent severe without psychotic features: Secondary | ICD-10-CM

## 2019-03-05 NOTE — Progress Notes (Signed)
Pt reported to [facility] for cortical mapping and motor threshold determination for Repetitive Transcranial Magnetic Stimulation treatment for Major Depressive Disorder. Pt completed a PHQ-9 with a score of 24(severe depression). Pt also completed a Beck's Depression Inventory with a score of 33(severe depression). Prior to procedure, pt signed an informed consent agreement for [place] treatment. Pt's treatment area was found by applying single pulses to her left motor cortex, hunting along the anterior/posterior plane and along the superior oblique angle until the best motor response was elicited from the pt's right thumb. The best response was observed at 4.5 cmA/P and 30degrees SOA, with a coil angle of 0degrees. Pt's motor threshold was calculated using the [employer]'s proprietary MT Assist algorithm, which produced a calculated motor threshold of 1.13SMT. Per these findings, pt's treatment parameters are as follows: A/P -- 10.5cm, SOA -- 30degrees, Coil Angle -- 0degrees, Motor Threshold -- 1.14SMT. With these parameters, the pt will receive 36 sessions of TMS according to the following protocol: 3000 pulses per session, with stimulation in bursts of pulses lasting 4 seconds at a frequency of 10 Hz, separated by 26 seconds of rest. After determining pt's tx parameters, coil was moved to the treatment location, and the first burst of pulses was applied at a reduced power of 80%MT. Pt reported no complaints, and stated that the stimulation was tolerable. Upon completion of mapping, pt completed a few treatment intervals for observation of side effects. Pt tolerated tx well. Pt departed from clinic without issue.

## 2019-03-06 ENCOUNTER — Other Ambulatory Visit (HOSPITAL_COMMUNITY): Payer: PPO | Attending: Psychiatry | Admitting: Emergency Medicine

## 2019-03-06 ENCOUNTER — Other Ambulatory Visit: Payer: Self-pay

## 2019-03-06 DIAGNOSIS — F339 Major depressive disorder, recurrent, unspecified: Secondary | ICD-10-CM | POA: Insufficient documentation

## 2019-03-07 ENCOUNTER — Other Ambulatory Visit (INDEPENDENT_AMBULATORY_CARE_PROVIDER_SITE_OTHER): Payer: PPO | Admitting: Emergency Medicine

## 2019-03-07 ENCOUNTER — Other Ambulatory Visit: Payer: Self-pay

## 2019-03-07 DIAGNOSIS — F332 Major depressive disorder, recurrent severe without psychotic features: Secondary | ICD-10-CM

## 2019-03-07 DIAGNOSIS — F339 Major depressive disorder, recurrent, unspecified: Secondary | ICD-10-CM | POA: Diagnosis not present

## 2019-03-07 NOTE — Progress Notes (Signed)
Patient reported to [facility] for Repetitive Transcranial Magnetic Stimulation treatment for severe episode of recurrent major depressive disorder, without psychotic features. Patient presented with appropriate affect, level mood and denied any suicidal or homicidal ideations. Patient denies any other current symptoms and remains optimistic with continued [place] treatment. Patient reported no change in alcohol/substance use, caffeine consumption, sleep pattern or metal implant status since previous tx. Patient reported to tx session with pleasant affectaccompanied by his wife.Hewatched TV.He shared that he experienced a minor headache yesterday afternoon. He took a dosage of pain relieve medication. Pt expressed that he experiences anxiety especially in the morning.Powerwas titrated to120% for the duration of txand will remain there until patient gets adjusted. Patient reported no complaints or discomfort. Patientdeparted post-treatment with no concerns or complaints.

## 2019-03-10 ENCOUNTER — Ambulatory Visit (HOSPITAL_COMMUNITY): Payer: PPO | Admitting: Psychiatry

## 2019-03-10 ENCOUNTER — Other Ambulatory Visit: Payer: Self-pay

## 2019-03-10 ENCOUNTER — Other Ambulatory Visit (HOSPITAL_COMMUNITY): Payer: PPO | Attending: Psychiatry | Admitting: Emergency Medicine

## 2019-03-10 DIAGNOSIS — F332 Major depressive disorder, recurrent severe without psychotic features: Secondary | ICD-10-CM | POA: Insufficient documentation

## 2019-03-10 NOTE — Progress Notes (Signed)
Patient reported to [facility] for Repetitive Transcranial Magnetic Stimulation treatment for severe episode of recurrent major depressive disorder, without psychotic features. Patient presented with appropriate affect, level mood and denied any suicidal or homicidal ideations. Patient denies any other current symptoms and remains optimistic with continued [place] treatment. Patient reported no change in alcohol/substance use, caffeine consumption, sleep pattern or metal implant status since previous tx. Patient reported to tx session with pleasant affect.Hewatched TV.He shared that he went on a walk with his wife this weekend. There has been no changes in his mood.Powerwas titrated to120% for the duration of txand will remain there until patient gets adjusted. Patient reported no complaints or discomfort. Patientdeparted post-treatment with no concerns or complaints.

## 2019-03-11 ENCOUNTER — Other Ambulatory Visit: Payer: Self-pay

## 2019-03-11 ENCOUNTER — Other Ambulatory Visit (INDEPENDENT_AMBULATORY_CARE_PROVIDER_SITE_OTHER): Payer: PPO | Admitting: Emergency Medicine

## 2019-03-11 DIAGNOSIS — F332 Major depressive disorder, recurrent severe without psychotic features: Secondary | ICD-10-CM | POA: Diagnosis not present

## 2019-03-11 DIAGNOSIS — F339 Major depressive disorder, recurrent, unspecified: Secondary | ICD-10-CM | POA: Diagnosis present

## 2019-03-11 NOTE — Progress Notes (Signed)
Patient reported to [facility] for Repetitive Transcranial Magnetic Stimulation treatment for severe episode of recurrent major depressive disorder, without psychotic features. Patient presented with appropriate affect, level mood and denied any suicidal or homicidal ideations. Patient denies any other current symptoms and remains optimistic with continued [place] treatment. Patient reported no change in alcohol/substance use, caffeine consumption, sleep pattern or metal implant status since previous tx. Patient reported to tx session with pleasant affect.Hewatched TV. There has been no changes in his mood.Powerwas titrated to120% for the duration of txand will remain there until patient gets adjusted. Patient reported no complaints or discomfort. Patientdeparted post-treatment with no concerns or complaints.

## 2019-03-12 ENCOUNTER — Other Ambulatory Visit: Payer: Self-pay

## 2019-03-12 ENCOUNTER — Other Ambulatory Visit (INDEPENDENT_AMBULATORY_CARE_PROVIDER_SITE_OTHER): Payer: PPO | Admitting: Emergency Medicine

## 2019-03-12 DIAGNOSIS — F332 Major depressive disorder, recurrent severe without psychotic features: Secondary | ICD-10-CM

## 2019-03-12 NOTE — Progress Notes (Signed)
Patient reported to [facility] for Repetitive Transcranial Magnetic Stimulation treatment for severe episode of recurrent major depressive disorder, without psychotic features. Patient presented with appropriate affect, level mood and denied any suicidal or homicidal ideations. Patient denies any other current symptoms and remains optimistic with continued [place] treatment. Patient reported no change in alcohol/substance use, caffeine consumption, sleep pattern or metal implant status since previous tx. Patient reported to tx session with pleasant affect.Hewatched TV.There has been no changes in his mood.Powerwas titrated to120% for the duration of txand will remain there until patient gets adjusted. Patient reported no complaints or discomfort. Patientdeparted post-treatment with no concerns or complaints.

## 2019-03-13 ENCOUNTER — Telehealth: Payer: Self-pay

## 2019-03-13 ENCOUNTER — Other Ambulatory Visit (INDEPENDENT_AMBULATORY_CARE_PROVIDER_SITE_OTHER): Payer: PPO | Admitting: Emergency Medicine

## 2019-03-13 ENCOUNTER — Ambulatory Visit (INDEPENDENT_AMBULATORY_CARE_PROVIDER_SITE_OTHER): Payer: PPO | Admitting: Psychiatry

## 2019-03-13 ENCOUNTER — Other Ambulatory Visit: Payer: Self-pay

## 2019-03-13 ENCOUNTER — Encounter: Payer: Self-pay | Admitting: Psychiatry

## 2019-03-13 DIAGNOSIS — F332 Major depressive disorder, recurrent severe without psychotic features: Secondary | ICD-10-CM | POA: Diagnosis not present

## 2019-03-13 DIAGNOSIS — F411 Generalized anxiety disorder: Secondary | ICD-10-CM | POA: Diagnosis not present

## 2019-03-13 MED ORDER — QUETIAPINE FUMARATE 25 MG PO TABS: 25.0000 mg | ORAL_TABLET | Freq: Two times a day (BID) | ORAL | 1 refills | Status: DC | PRN

## 2019-03-13 MED ORDER — TRAZODONE HCL 100 MG PO TABS: 100.0000 mg | ORAL_TABLET | Freq: Every evening | ORAL | 1 refills | Status: DC | PRN

## 2019-03-13 MED ORDER — CITALOPRAM HYDROBROMIDE 40 MG PO TABS: 40.0000 mg | ORAL_TABLET | Freq: Every day | ORAL | 0 refills | Status: DC

## 2019-03-13 MED ORDER — QUETIAPINE FUMARATE 50 MG PO TABS: 50.0000 mg | ORAL_TABLET | Freq: Every day | ORAL | 0 refills | Status: DC

## 2019-03-13 MED ORDER — BUPROPION HCL ER (XL) 150 MG PO TB24: 150.0000 mg | ORAL_TABLET | Freq: Every day | ORAL | 0 refills | Status: DC

## 2019-03-13 NOTE — Telephone Encounter (Signed)
labwork order mailed

## 2019-03-13 NOTE — Progress Notes (Signed)
Virtual Visit via Video Note  I connected with [name] on 03/13/19 at  1:30 PM EST by a video enabled telemedicine application and verified that I am speaking with the correct person using two identifiers.   I discussed the limitations of evaluation and management by telemedicine and the availability of in person appointments. The patient expressed understanding and agreed to proceed.    I discussed the assessment and treatment plan with the patient. The patient was provided an opportunity to ask questions and all were answered. The patient agreed with the plan and demonstrated an understanding of the instructions.   The patient was advised to call back or seek an in-person evaluation if the symptoms worsen or if the condition fails to improve as anticipated.   [place] MD OP Progress Note  03/13/2019 5:10 PM Tuan Delsignore  MRN:  [id number]  Chief Complaint:  Chief Complaint    Follow-up     HPI: [name] is a 58 year old Caucasian male, married, lives in [place] has a history of depression, anxiety, multiple medical problems including history of tracheostomy, status post multiple intubation all during one hospitalization post acute hypoxemic respiratory failure due to opioid overdose, history of CVA with right-sided hemiparesis, diabetes, hypertension, NSTEMI, hypothyroidism, hyperlipidemia, chronic pain was evaluated by telemedicine today.  Patient today reports that he is currently undergoing [place], just completed his seventh treatment.  Patient reports overall he has noticed improvement in his mood symptoms since starting the [place].  He reports he continues to be compliant on his medications as prescribed.  Patient reports he currently does not have any suicidal thoughts or any homicidality or perceptual disturbances.  He however continues to struggle with falling asleep.  He has no problems staying asleep.  Discussed readjusting his medication.  He is already on Seroquel as  well as trazodone.  He does take an extra dosage of Seroquel 25 mg at bedtime along with his 50 mg.  Discussed readjusting the trazodone to 150 mg as needed.  Patient reports he was unable to find a therapist.  [facility] did recommend therapist for him however when he called none of them are taking new patients.  Patient denies any other concerns today.   Visit Diagnosis:    ICD-10-CM   1. Severe episode of recurrent major depressive disorder, without psychotic features ([place])  F33.2 TSH    traZODone (DESYREL) 100 MG tablet    QUEtiapine (SEROQUEL) 50 MG tablet    QUEtiapine (SEROQUEL) 25 MG tablet    buPROPion (WELLBUTRIN XL) 150 MG 24 hr tablet    citalopram (CELEXA) 40 MG tablet  2. GAD (generalized anxiety disorder)  F41.1 TSH    QUEtiapine (SEROQUEL) 50 MG tablet    QUEtiapine (SEROQUEL) 25 MG tablet    buPROPion (WELLBUTRIN XL) 150 MG 24 hr tablet    citalopram (CELEXA) 40 MG tablet    Past Psychiatric History: Reviewed past psychiatric history from my progress note on 01/15/2019.  Past trials of fluvoxamine, Rexulti, Xanax, Abilify.  Patient with multiple inpatient mental health admissions.  Most recently he had back-to-back inpatient mental health admission August 2020 and recently September 17.Patient did not tolerate ECT.  Past Medical History:  Past Medical History:  Diagnosis Date  . Depression   . Diabetes (HCC)    Insulin Pump  . Diabetes mellitus type I ([place])   . Diabetes mellitus without complication ([place])   . GERD (gastroesophageal reflux disease)   . H/O laryngectomy   . Heel bone fracture   .  Hyperlipidemia   . Hypertension   . Radicular pain of right lower extremity   . Stroke ([place])   . Suicide attempt [facility]) 2014   damaged larynx - tracheostomy  . Thyroid disease     Past Surgical History:  Procedure Laterality Date  . COLONOSCOPY WITH PROPOFOL N/A 05/15/2018   Procedure: COLONOSCOPY WITH PROPOFOL;  Surgeon: [name], [name], MD;  Location: [facility] ENDOSCOPY;   Service: Gastroenterology;  Laterality: N/A;  . ESOPHAGOGASTRODUODENOSCOPY N/A 05/15/2018   Procedure: ESOPHAGOGASTRODUODENOSCOPY (EGD);  Surgeon: [name], [name], MD;  Location: [facility] ENDOSCOPY;  Service: Gastroenterology;  Laterality: N/A;  . FRACTURE SURGERY    . Heel bone reconstruction Left   . HERNIA REPAIR  [id number]   Umbilical hernia repair   . LARYNGECTOMY    . NECK SURGERY     fusion  . SPINE SURGERY    . TRACHEOSTOMY  2014   from [place] attempt    Family Psychiatric History: I have reviewed family psychiatric history from my progress note on 01/15/2019  Family History:  Family History  Problem Relation Age of Onset  . Osteoporosis Mother   . Diabetes Mother   . Hypertension Father   . Mental illness Neg Hx     Social History: Reviewed social history from my progress note on 01/15/2019 Social History   Socioeconomic History  . Marital status: Married    Spouse name: Not on file  . Number of children: Not on file  . Years of education: Not on file  . Highest education level: Not on file  Occupational History  . Not on file  Social Needs  . Financial resource strain: Not on file  . Food insecurity    Worry: Not on file    Inability: Not on file  . Transportation needs    Medical: Not on file    Non-medical: Not on file  Tobacco Use  . Smoking status: Former Smoker    Packs/day: 0.00    Types: Cigarettes    Quit date: 04/09/2013    Years since quitting: 5.9  . Smokeless tobacco: Never Used  Substance and Sexual Activity  . Alcohol use: Yes    Alcohol/week: 2.0 standard drinks    Types: 2 Shots of liquor per week    Comment: rare  . Drug use: No    Comment: Pt denied; UDS not available  . Sexual activity: Yes    Partners: Female    Birth control/protection: Condom  Lifestyle  . Physical activity    Days per week: Not on file    Minutes per session: Not on file  . Stress: Not on file  Relationships  . Social Herbalist on phone: Not on file     Gets together: Not on file    Attends religious service: Not on file    Active member of club or organization: Not on file    Attends meetings of clubs or organizations: Not on file    Relationship status: Not on file  Other Topics Concern  . Not on file  Social History Narrative  . Not on file    Allergies:  Allergies  Allergen Reactions  . Buspar [Buspirone]     Makes the patient "flip out"  . Depakote [Valproic Acid]     Causes excessive drowsiness  . Clopidogrel Rash  . Gabapentin Itching and Rash    Metabolic Disorder Labs: Lab Results  Component Value Date   HGBA1C 7.8 (H) 01/01/2019   MPG  177.16 01/01/2019   MPG 174.29 12/23/2018   No results found for: PROLACTIN Lab Results  Component Value Date   CHOL 161 09/15/2016   TRIG 190 (H) 09/15/2016   HDL 41 09/15/2016   CHOLHDL 3.9 09/15/2016   VLDL 38 09/15/2016   LDLCALC 82 09/15/2016   Lab Results  Component Value Date   TSH 1.952 09/15/2016    Therapeutic Level Labs: No results found for: LITHIUM Lab Results  Component Value Date   VALPROATE 19 (L) 09/12/2016   No components found for:  CBMZ  Current Medications: Current Outpatient Medications  Medication Sig Dispense Refill  . albuterol (ACCUNEB) 1.25 MG/3ML nebulizer solution SMARTSIG:3 Milliliter(s) Via Nebulizer Every 6 Hours PRN    . amLODipine (NORVASC) 5 MG tablet Take 1 tablet (5 mg total) by mouth daily. 30 tablet 1  . aspirin EC 81 MG tablet Take 1 tablet (81 mg total) by mouth daily. 30 tablet 1  . buPROPion (WELLBUTRIN XL) 150 MG 24 hr tablet Take 1 tablet (150 mg total) by mouth daily. 90 tablet 0  . citalopram (CELEXA) 40 MG tablet Take 1 tablet (40 mg total) by mouth daily. 90 tablet 0  . empagliflozin (JARDIANCE) 25 MG TABS tablet Take 25 mg by mouth daily. 30 tablet 1  . insulin aspart (NOVOLOG) 100 UNIT/ML injection INJECT UP TO 120 UNITS UNDER THE SKIN AS DIRECTED DAILY VIA INSULIN PUMP    . insulin aspart (NOVOLOG) 100 UNIT/ML  injection INJECT UP TO 120 UNITS UNDER THE SKIN UTD D VIA INSULIN PUMP    . levofloxacin (LEVAQUIN) 750 MG tablet Take by mouth.    . levofloxacin (LEVAQUIN) 750 MG tablet Take 750 mg by mouth daily.    Marland Kitchen levothyroxine (SYNTHROID) 75 MCG tablet Take 1 tablet (75 mcg total) by mouth daily at 6 (six) AM. 30 tablet 1  . liothyronine (CYTOMEL) 5 MCG tablet Take 2 tablets (10 mcg total) by mouth daily. 60 tablet 1  . losartan-hydrochlorothiazide (HYZAAR) 100-25 MG tablet TAKE 1 TABLET BY MOUTH EVERY DAY    . QUEtiapine (SEROQUEL) 25 MG tablet Take 1 tablet (25 mg total) by mouth 2 (two) times daily as needed (anxiety). Daily as needed for severe anxiety and at bedtime as needed for sleep 60 tablet 1  . QUEtiapine (SEROQUEL) 50 MG tablet Take 1 tablet (50 mg total) by mouth at bedtime. 90 tablet 0  . simvastatin (ZOCOR) 20 MG tablet TAKE 1 TABLET(20 MG) BY MOUTH EVERY NIGHT    . traZODone (DESYREL) 100 MG tablet Take 1-1.5 tablets (100-150 mg total) by mouth at bedtime as needed for sleep. 135 tablet 1  . hydrochlorothiazide (HYDRODIURIL) 25 MG tablet hydrochlorothiazide 25 mg tablet     No current facility-administered medications for this visit.      Musculoskeletal: Strength & Muscle Tone: UTA Gait & Station: normal Patient leans: N/A  Psychiatric Specialty Exam: Review of Systems  Psychiatric/Behavioral: Positive for depression. The patient has insomnia.   All other systems reviewed and are negative.   There were no vitals taken for this visit.There is no height or weight on file to calculate BMI.  General Appearance: Casual  Eye Contact:  Fair  Speech:  Slow has tracheostomy  Volume:  Normal  Mood:  Depressed  Affect:  Congruent  Thought Process:  Goal Directed and Descriptions of Associations: Intact  Orientation:  Full (Time, Place, and Person)  Thought Content: Logical   Suicidal Thoughts:  No  Homicidal Thoughts:  No  Memory:  Immediate;   Fair Recent;   Fair Remote;   Fair   Judgement:  Fair  Insight:  Fair  Psychomotor Activity:  Normal  Concentration:  Concentration: Fair and Attention Span: Fair  Recall:  [employer] of Knowledge: Fair  Language: Fair  Akathisia:  No  Handed:  Right  AIMS (if indicated): uta  Assets:  Communication Skills Desire for Improvement Housing Social Support  ADL's:  Intact  Cognition: WNL  Sleep:  Poor   Screenings: AUDIT     Admission (Discharged) from 01/22/2019 in [facility] Admission (Discharged) from 12/31/2018 in [facility] Admission (Discharged) from 09/14/2016 in [facility]  Alcohol Use Disorder Identification Test Final Score (AUDIT)  0  0  1    ECT-MADRS     Admission (Discharged) from 01/22/2019 in [facility] Total Score  27    Mini-Mental     Admission (Discharged) from 01/22/2019 in [facility]  Total Score (max 30 points )  30    PHQ2-9     Office Visit from 03/13/2019 in [facility] Office Visit from 02/20/2019 in [facility] Office Visit from 02/10/2019 in [facility] Patient Outreach Telephone from 12/12/2017 in [facility] from 08/22/2016 in [facility]  PHQ-2 Total Score  4  6  3   0  2  PHQ-9 Total Score  16  21  13   -  4       Assessment and Plan: Nazaire is a 58 year old Caucasian male, married, on disability, has a history of multiple medical problems, MDD, ADHD was evaluated by telemedicine today.  He is biologically predisposed given his multiple health issues.  He also has psychosocial stressors including his physical limitations, chronic mental health problems, current pandemic.  Patient with recent inpatient mental health admissions back-to-back.  Patient currently is undergoing [place] and is making progress.  Patient however continues to  struggle with sleep.  He will benefit from medication readjustment as well as psychotherapy sessions.  Plan MDD-improving Celexa 40 mg p.o. daily Seroquel 50 mg p.o. nightly Reduce Seroquel 25 mg 2 p.o. twice daily as needed.  Advised him to take 1 dosage at bedtime along with the 50 mg. Increase trazodone to 150 mg p.o. nightly as needed for sleep Wellbutrin XL 150 mg p.o. daily Cytomel as prescribed PHQ-9-16  GAD-improving Celexa as prescribed Patient advised to connect with psychotherapist-[facility]. Reduce Seroquel to 25 mg p.o. twice daily as needed for anxiety symptoms.  Patient to continue [place] therapy at this time.  He completed 7 treatments so far.  Will order labs-TSH.  His recent TSH level was abnormal.  Patient has upcoming appointment with his primary care provider.  Will mail lab slip to him today.  Follow-up in clinic in 4 weeks or sooner if needed.  December 7 at 2 PM  I have spent atleast 15 minutes non face to face with patient today. More than 50 % of the time was spent for psychoeducation and supportive psychotherapy and care coordination. This note was generated in part or whole with voice recognition software. Voice recognition is usually quite accurate but there are transcription errors that can and very often do occur. I apologize for any typographical errors that were not detected and corrected.       Ursula Alert, MD 03/13/2019, 5:10 PM

## 2019-03-13 NOTE — Progress Notes (Signed)
Patient reported to [facility] for Repetitive Transcranial Magnetic Stimulation treatment for severe episode of recurrent major depressive disorder, without psychotic features. Patient presented with appropriate affect, level mood and denied any suicidal or homicidal ideations. Patient denies any other current symptoms and remains optimistic with continued [place] treatment. Patient reported no change in alcohol/substance use, caffeine consumption, sleep pattern or metal implant status since previous tx. Patient reported to tx session with pleasant affect.Hewatched TV.Powerwas titrated to120% for the duration of txand will remain there until patient gets adjusted. Patient reported no complaints or discomfort. Patientdeparted post-treatment with no concerns or complaints.

## 2019-03-14 ENCOUNTER — Other Ambulatory Visit (INDEPENDENT_AMBULATORY_CARE_PROVIDER_SITE_OTHER): Payer: PPO | Admitting: Emergency Medicine

## 2019-03-14 DIAGNOSIS — Z448 Encounter for fitting and adjustment of other external prosthetic devices: Secondary | ICD-10-CM | POA: Diagnosis not present

## 2019-03-14 DIAGNOSIS — L928 Other granulomatous disorders of the skin and subcutaneous tissue: Secondary | ICD-10-CM | POA: Diagnosis not present

## 2019-03-14 DIAGNOSIS — Z87891 Personal history of nicotine dependence: Secondary | ICD-10-CM | POA: Diagnosis not present

## 2019-03-14 DIAGNOSIS — Z8521 Personal history of malignant neoplasm of larynx: Secondary | ICD-10-CM | POA: Diagnosis not present

## 2019-03-14 DIAGNOSIS — F332 Major depressive disorder, recurrent severe without psychotic features: Secondary | ICD-10-CM | POA: Diagnosis not present

## 2019-03-14 DIAGNOSIS — Z9002 Acquired absence of larynx: Secondary | ICD-10-CM | POA: Diagnosis not present

## 2019-03-14 NOTE — Progress Notes (Signed)
Patient reported to [facility] for Repetitive Transcranial Magnetic Stimulation treatment for severe episode of recurrent major depressive disorder, without psychotic features. Patient presented with appropriate affect, level mood and denied any suicidal or homicidal ideations. Patient denies any other current symptoms and remains optimistic with continued [place] treatment. Patient reported no change in alcohol/substance use, caffeine consumption, sleep pattern or metal implant status since previous tx. Patient reported to tx session with pleasant affect.Hewatched TV.Powerwas titrated to120% for the duration of txand will remain there until patient gets adjusted. Patient reported no complaints or discomfort. Patientdeparted post-treatment with no concerns or complaints.

## 2019-03-17 ENCOUNTER — Encounter (HOSPITAL_COMMUNITY): Payer: PPO

## 2019-03-18 ENCOUNTER — Encounter (HOSPITAL_COMMUNITY): Payer: PPO

## 2019-03-19 ENCOUNTER — Other Ambulatory Visit: Payer: Self-pay

## 2019-03-19 ENCOUNTER — Other Ambulatory Visit (INDEPENDENT_AMBULATORY_CARE_PROVIDER_SITE_OTHER): Payer: PPO | Admitting: Emergency Medicine

## 2019-03-19 DIAGNOSIS — F332 Major depressive disorder, recurrent severe without psychotic features: Secondary | ICD-10-CM | POA: Diagnosis not present

## 2019-03-19 DIAGNOSIS — Z930 Tracheostomy status: Secondary | ICD-10-CM | POA: Diagnosis not present

## 2019-03-19 DIAGNOSIS — Z430 Encounter for attention to tracheostomy: Secondary | ICD-10-CM | POA: Diagnosis not present

## 2019-03-19 NOTE — Progress Notes (Signed)
Patient reported to [facility] for Repetitive Transcranial Magnetic Stimulation treatment for severe episode of recurrent major depressive disorder, without psychotic features. Patient presented with appropriate affect, level mood and denied any suicidal or homicidal ideations. Patient denies any other current symptoms and remains optimistic with continued [place] treatment. Patient reported no change in alcohol/substance use, caffeine consumption, sleep pattern or metal implant status since previous tx. Patient reported to tx session with pleasant affect.Hewatched TV.Powerwas titrated to120% for the duration of txand will remain there until patient gets adjusted. Patient reported no complaints or discomfort. Patientdeparted post-treatment with no concerns or complaints.

## 2019-03-20 ENCOUNTER — Other Ambulatory Visit: Payer: Self-pay

## 2019-03-20 ENCOUNTER — Other Ambulatory Visit (INDEPENDENT_AMBULATORY_CARE_PROVIDER_SITE_OTHER): Payer: PPO | Admitting: Emergency Medicine

## 2019-03-20 DIAGNOSIS — R0600 Dyspnea, unspecified: Secondary | ICD-10-CM | POA: Diagnosis not present

## 2019-03-20 DIAGNOSIS — E1029 Type 1 diabetes mellitus with other diabetic kidney complication: Secondary | ICD-10-CM | POA: Diagnosis not present

## 2019-03-20 DIAGNOSIS — F332 Major depressive disorder, recurrent severe without psychotic features: Secondary | ICD-10-CM

## 2019-03-20 DIAGNOSIS — R05 Cough: Secondary | ICD-10-CM | POA: Diagnosis not present

## 2019-03-20 DIAGNOSIS — R809 Proteinuria, unspecified: Secondary | ICD-10-CM | POA: Diagnosis not present

## 2019-03-20 NOTE — Progress Notes (Signed)
Patient reported to [facility] for Repetitive Transcranial Magnetic Stimulation treatment for severe episode of recurrent major depressive disorder, without psychotic features. Patient presented with appropriate affect, level mood and denied any suicidal or homicidal ideations. Patient denies any other current symptoms and remains optimistic with continued [place] treatment. Patient reported no change in alcohol/substance use, caffeine consumption, sleep pattern or metal implant status since previous tx. Patient reported to tx session with pleasant affect.Hewatched TV.Powerwas titrated to120% for the duration of txand will remain there until patient gets adjusted. Patient reported no complaints or discomfort. Patientdeparted post-treatment with no concerns or complaints.

## 2019-03-21 ENCOUNTER — Other Ambulatory Visit (INDEPENDENT_AMBULATORY_CARE_PROVIDER_SITE_OTHER): Payer: PPO | Admitting: Emergency Medicine

## 2019-03-21 ENCOUNTER — Other Ambulatory Visit: Payer: Self-pay

## 2019-03-21 DIAGNOSIS — E1029 Type 1 diabetes mellitus with other diabetic kidney complication: Secondary | ICD-10-CM | POA: Diagnosis not present

## 2019-03-21 DIAGNOSIS — J4530 Mild persistent asthma, uncomplicated: Secondary | ICD-10-CM | POA: Diagnosis not present

## 2019-03-21 DIAGNOSIS — F332 Major depressive disorder, recurrent severe without psychotic features: Secondary | ICD-10-CM | POA: Diagnosis not present

## 2019-03-21 DIAGNOSIS — R809 Proteinuria, unspecified: Secondary | ICD-10-CM | POA: Diagnosis not present

## 2019-03-21 NOTE — Progress Notes (Signed)
Patient reported to [facility] for Repetitive Transcranial Magnetic Stimulation treatment for severe episode of recurrent major depressive disorder, without psychotic features. Patient presented with appropriate affect, level mood and denied any suicidal or homicidal ideations. Patient denies any other current symptoms and remains optimistic with continued [place] treatment. Patient reported no change in alcohol/substance use, caffeine consumption, sleep pattern or metal implant status since previous tx. Patient reported to tx session with pleasant affect.Hewatched TV.Powerwas titrated to120% for the duration of txand will remain there until patient gets adjusted. Patient reported no complaints or discomfort. Patientdeparted post-treatment with no concerns or complaints.

## 2019-03-25 DIAGNOSIS — Z930 Tracheostomy status: Secondary | ICD-10-CM | POA: Diagnosis not present

## 2019-03-25 DIAGNOSIS — Z0000 Encounter for general adult medical examination without abnormal findings: Secondary | ICD-10-CM | POA: Diagnosis not present

## 2019-03-25 DIAGNOSIS — E113293 Type 2 diabetes mellitus with mild nonproliferative diabetic retinopathy without macular edema, bilateral: Secondary | ICD-10-CM | POA: Diagnosis not present

## 2019-03-25 DIAGNOSIS — R202 Paresthesia of skin: Secondary | ICD-10-CM | POA: Diagnosis not present

## 2019-03-25 DIAGNOSIS — E039 Hypothyroidism, unspecified: Secondary | ICD-10-CM | POA: Diagnosis not present

## 2019-03-25 DIAGNOSIS — Z79899 Other long term (current) drug therapy: Secondary | ICD-10-CM | POA: Diagnosis not present

## 2019-03-26 ENCOUNTER — Encounter (HOSPITAL_COMMUNITY): Payer: PPO

## 2019-03-27 ENCOUNTER — Other Ambulatory Visit: Payer: Self-pay

## 2019-03-27 ENCOUNTER — Encounter (HOSPITAL_COMMUNITY): Payer: Self-pay

## 2019-03-27 ENCOUNTER — Other Ambulatory Visit (INDEPENDENT_AMBULATORY_CARE_PROVIDER_SITE_OTHER): Payer: PPO | Admitting: *Deleted

## 2019-03-27 DIAGNOSIS — F332 Major depressive disorder, recurrent severe without psychotic features: Secondary | ICD-10-CM | POA: Diagnosis not present

## 2019-03-27 DIAGNOSIS — Z430 Encounter for attention to tracheostomy: Secondary | ICD-10-CM | POA: Diagnosis not present

## 2019-03-27 DIAGNOSIS — Z930 Tracheostomy status: Secondary | ICD-10-CM | POA: Diagnosis not present

## 2019-03-27 NOTE — Progress Notes (Signed)
Patient reported to [facility] for Repetitive Transcranial Magnetic Stimulation treatment for severe episode of recurrent major depressive disorder, without psychotic features. Patient presented with appropriate affect, level mood and denied any suicidal or homicidal ideations. Patient denies any other current symptoms and remains optimistic with continued [place] treatment. Patient reported no change in alcohol/substance use, caffeine consumption, sleep pattern or metal implant status since previous tx. Patient reported to tx session with pleasant affect.Hewatched TV.Powerwas titrated to120% for the duration of txand will remain there until patient gets adjusted. Patient reported no complaints or discomfort. Patientdeparted post-treatment with no concerns or complaints.

## 2019-03-28 ENCOUNTER — Other Ambulatory Visit (INDEPENDENT_AMBULATORY_CARE_PROVIDER_SITE_OTHER): Payer: PPO | Admitting: *Deleted

## 2019-03-28 ENCOUNTER — Other Ambulatory Visit: Payer: Self-pay

## 2019-03-28 DIAGNOSIS — F332 Major depressive disorder, recurrent severe without psychotic features: Secondary | ICD-10-CM

## 2019-03-28 NOTE — Progress Notes (Signed)
Patient reported to [facility] for Repetitive Transcranial Magnetic Stimulation treatment for severe episode of recurrent major depressive disorder, without psychotic features. Patient presented with appropriate affect, level mood and denied any suicidal or homicidal ideations. Patient denies any other current symptoms and remains optimistic with continued [place] treatment. Patient reported no change in alcohol/substance use, caffeine consumption, sleep pattern or metal implant status since previous tx. Patient reported to tx session with pleasant affect.Hewatched TV.Powerwas titrated to120% for the duration of txand will remain there until patient gets adjusted. Patient reported no complaints or discomfort. Patientdeparted post-treatment with no concerns or complaints.

## 2019-04-01 ENCOUNTER — Other Ambulatory Visit (INDEPENDENT_AMBULATORY_CARE_PROVIDER_SITE_OTHER): Payer: PPO | Admitting: Emergency Medicine

## 2019-04-01 ENCOUNTER — Other Ambulatory Visit: Payer: Self-pay

## 2019-04-01 DIAGNOSIS — F332 Major depressive disorder, recurrent severe without psychotic features: Secondary | ICD-10-CM | POA: Diagnosis not present

## 2019-04-01 NOTE — Progress Notes (Signed)
Patient reported to [facility] for Repetitive Transcranial Magnetic Stimulation treatment for severe episode of recurrent major depressive disorder, without psychotic features. Patient presented with appropriate affect, level mood and denied any suicidal or homicidal ideations. Patient denies any other current symptoms and remains optimistic with continued [place] treatment. Patient reported no change in alcohol/substance use, caffeine consumption, sleep pattern or metal implant status since previous tx. Patient reported to tx session with pleasant affect.Pt shared that he is not feeling well. He feels overwhelmed with negative emotions. However, he is trying to remain hopeful. He is going to start taking 15 minute walks outside. Hewatched TV.Powerwas titrated to120% for the duration of txand will remain there until patient gets adjusted. Patient reported no complaints or discomfort. Patientdeparted post-treatment with no concerns or complaints.

## 2019-04-02 ENCOUNTER — Other Ambulatory Visit (INDEPENDENT_AMBULATORY_CARE_PROVIDER_SITE_OTHER): Payer: PPO | Admitting: Emergency Medicine

## 2019-04-02 ENCOUNTER — Other Ambulatory Visit: Payer: Self-pay

## 2019-04-02 DIAGNOSIS — F332 Major depressive disorder, recurrent severe without psychotic features: Secondary | ICD-10-CM

## 2019-04-02 NOTE — Progress Notes (Signed)
Patient reported to [facility] for Repetitive Transcranial Magnetic Stimulation treatment for severe episode of recurrent major depressive disorder, without psychotic features. Patient presented with appropriate affect, level mood and denied any suicidal or homicidal ideations. Patient denies any other current symptoms and remains optimistic with continued [place] treatment. Patient reported no change in alcohol/substance use, caffeine consumption, sleep pattern or metal implant status since previous tx. Patient reported to tx session with pleasant affect. Hewatched TV.Powerwas titrated to120% for the duration of txand will remain there until patient gets adjusted. Patient reported no complaints or discomfort. Patientdeparted post-treatment with no concerns or complaints.

## 2019-04-07 ENCOUNTER — Other Ambulatory Visit: Payer: Self-pay

## 2019-04-07 ENCOUNTER — Other Ambulatory Visit (INDEPENDENT_AMBULATORY_CARE_PROVIDER_SITE_OTHER): Payer: PPO | Admitting: Emergency Medicine

## 2019-04-07 DIAGNOSIS — F332 Major depressive disorder, recurrent severe without psychotic features: Secondary | ICD-10-CM

## 2019-04-07 NOTE — Progress Notes (Signed)
Patient reported to [facility] for Repetitive Transcranial Magnetic Stimulation treatment for severe episode of recurrent major depressive disorder, without psychotic features. Patient presented with appropriate affect, level mood and denied any suicidal or homicidal ideations. Patient denies any other current symptoms and remains optimistic with continued [place] treatment. Patient reported no change in alcohol/substance use, caffeine consumption, sleep pattern or metal implant status since previous tx. Patient reported to tx session with pleasant affect.Hewatched TV. Pt stated that he has not been feeling well.Powerwas titrated to120% for the duration of txand will remain there until patient gets adjusted. Patient reported no complaints or discomfort. Patientdeparted post-treatment with no concerns or complaints.

## 2019-04-08 ENCOUNTER — Telehealth: Payer: Self-pay

## 2019-04-08 ENCOUNTER — Other Ambulatory Visit: Payer: Self-pay

## 2019-04-08 ENCOUNTER — Other Ambulatory Visit: Payer: Self-pay | Admitting: Psychiatry

## 2019-04-08 ENCOUNTER — Other Ambulatory Visit (HOSPITAL_COMMUNITY): Payer: PPO | Attending: Psychiatry | Admitting: Emergency Medicine

## 2019-04-08 DIAGNOSIS — F332 Major depressive disorder, recurrent severe without psychotic features: Secondary | ICD-10-CM | POA: Diagnosis not present

## 2019-04-08 DIAGNOSIS — F339 Major depressive disorder, recurrent, unspecified: Secondary | ICD-10-CM | POA: Diagnosis present

## 2019-04-08 NOTE — Telephone Encounter (Signed)
pt called wanted to know if any of the medication you have him on will make him hot and cool.  states one min. he is burning up the next he is freezing. Pt also stated that it just maybe his thyroid medications pt was advised to also contact his endocrinologist.  But I would send dr. Shea Evans a message also

## 2019-04-08 NOTE — Telephone Encounter (Signed)
Returned call to patient.  He reports he is feeling hot and cold.  He already had an appointment with his primary care doctor who advised if he could be taken off of the Cytomel and to discuss with Probation officer.  Advised patient to stop the Cytomel.  Recommended to get another thyroid panel done in a few weeks from now.  Patient will monitor his symptoms closely.

## 2019-04-08 NOTE — Progress Notes (Signed)
Patient reported to [facility] for Repetitive Transcranial Magnetic Stimulation treatment for severe episode of recurrent major depressive disorder, without psychotic features. Patient presented with appropriate affect, level mood and denied any suicidal or homicidal ideations. Patient denies any other current symptoms and remains optimistic with continued [place] treatment. Patient reported no change in alcohol/substance use, caffeine consumption, sleep pattern or metal implant status since previous tx. Patient reported to tx session with pleasant affect.Hewatched TV. Pt stated that he has not been feeling well.Powerwas titrated to120% for the duration of txand will remain there until patient gets adjusted. Patient reported no complaints or discomfort. Patientdeparted post-treatment with no concerns or complaints.

## 2019-04-09 ENCOUNTER — Other Ambulatory Visit (INDEPENDENT_AMBULATORY_CARE_PROVIDER_SITE_OTHER): Payer: PPO | Admitting: Emergency Medicine

## 2019-04-09 ENCOUNTER — Other Ambulatory Visit: Payer: Self-pay

## 2019-04-09 DIAGNOSIS — F332 Major depressive disorder, recurrent severe without psychotic features: Secondary | ICD-10-CM

## 2019-04-09 NOTE — Progress Notes (Signed)
Patient reported to [facility] for Repetitive Transcranial Magnetic Stimulation treatment for severe episode of recurrent major depressive disorder, without psychotic features. Patient presented with appropriate affect, level mood and denied any suicidal or homicidal ideations. Patient denies any other current symptoms and remains optimistic with continued [place] treatment. Patient reported no change in alcohol/substance use, caffeine consumption, sleep pattern or metal implant status since previous tx. Patient reported to tx session with pleasant affect.Hewatched TV.Powerwas titrated to120% for the duration of txand will remain there until patient gets adjusted. Patient reported no complaints or discomfort. Patientdeparted post-treatment with no concerns or complaints.

## 2019-04-10 ENCOUNTER — Other Ambulatory Visit (INDEPENDENT_AMBULATORY_CARE_PROVIDER_SITE_OTHER): Payer: PPO | Admitting: Emergency Medicine

## 2019-04-10 ENCOUNTER — Other Ambulatory Visit: Payer: Self-pay

## 2019-04-10 DIAGNOSIS — F332 Major depressive disorder, recurrent severe without psychotic features: Secondary | ICD-10-CM | POA: Diagnosis not present

## 2019-04-10 NOTE — Progress Notes (Signed)
Patient reported to [facility] for Repetitive Transcranial Magnetic Stimulation treatment for severe episode of recurrent major depressive disorder, without psychotic features. Patient presented with appropriate affect, level mood and denied any suicidal or homicidal ideations. Patient denies any other current symptoms and remains optimistic with continued [place] treatment. Patient reported no change in alcohol/substance use, caffeine consumption, sleep pattern or metal implant status since previous tx. Patient reported to tx session with pleasant affect.Hewatched TV.Pt requested meditation music.Powerwas titrated to120% for the duration of txand will remain there until patient gets adjusted. Patient reported no complaints or discomfort. Patientdeparted post-treatment with no concerns or complaints.

## 2019-04-11 ENCOUNTER — Other Ambulatory Visit (INDEPENDENT_AMBULATORY_CARE_PROVIDER_SITE_OTHER): Payer: PPO | Admitting: Emergency Medicine

## 2019-04-11 ENCOUNTER — Other Ambulatory Visit: Payer: Self-pay

## 2019-04-11 DIAGNOSIS — F332 Major depressive disorder, recurrent severe without psychotic features: Secondary | ICD-10-CM

## 2019-04-11 NOTE — Progress Notes (Signed)
Patient reported to [facility] for Repetitive Transcranial Magnetic Stimulation treatment for severe episode of recurrent major depressive disorder, without psychotic features. Patient presented with appropriate affect, level mood and denied any suicidal or homicidal ideations. Patient denies any other current symptoms and remains optimistic with continued [place] treatment. Patient reported no change in alcohol/substance use, caffeine consumption, sleep pattern or metal implant status since previous tx. Patient reported to tx session with pleasant affect.Hewatched TV.Pt requested meditation music. Pt shared he feels much better today.Powerwas titrated to120% for the duration of txand will remain there until patient gets adjusted. Patient reported no complaints or discomfort. Patientdeparted post-treatment with no concerns or complaints.

## 2019-04-14 ENCOUNTER — Encounter (HOSPITAL_COMMUNITY): Payer: PPO | Admitting: Emergency Medicine

## 2019-04-14 ENCOUNTER — Encounter: Payer: Self-pay | Admitting: Psychiatry

## 2019-04-14 ENCOUNTER — Other Ambulatory Visit: Payer: Self-pay

## 2019-04-14 ENCOUNTER — Ambulatory Visit (INDEPENDENT_AMBULATORY_CARE_PROVIDER_SITE_OTHER): Payer: PPO | Admitting: Psychiatry

## 2019-04-14 DIAGNOSIS — F411 Generalized anxiety disorder: Secondary | ICD-10-CM | POA: Diagnosis not present

## 2019-04-14 DIAGNOSIS — F332 Major depressive disorder, recurrent severe without psychotic features: Secondary | ICD-10-CM | POA: Diagnosis not present

## 2019-04-14 MED ORDER — QUETIAPINE FUMARATE 50 MG PO TABS: 75.0000 mg | ORAL_TABLET | Freq: Every day | ORAL | 0 refills | Status: DC

## 2019-04-14 MED ORDER — TRAZODONE HCL 100 MG PO TABS: 100.0000 mg | ORAL_TABLET | Freq: Every evening | ORAL | 0 refills | Status: DC | PRN

## 2019-04-14 NOTE — Progress Notes (Addendum)
Virtual Visit via Video Note  I connected with [name] on 04/14/19 at  2:00 PM EST by a video enabled telemedicine application and verified that I am speaking with the correct person using two identifiers.   I discussed the limitations of evaluation and management by telemedicine and the availability of in person appointments. The patient expressed understanding and agreed to proceed.    I discussed the assessment and treatment plan with the patient. The patient was provided an opportunity to ask questions and all were answered. The patient agreed with the plan and demonstrated an understanding of the instructions.   The patient was advised to call back or seek an in-person evaluation if the symptoms worsen or if the condition fails to improve as anticipated.  [facility] MD OP Progress Note  04/14/2019 2:31 PM [name]  MRN:  [id number]  Chief Complaint:  Chief Complaint    Follow-up     HPI: [name] is a 58 year old Caucasian male, married, lives in [place], has a history of depression, generalized anxiety disorder, multiple medical problems including history of tracheostomy, status post multiple intubation all during one hospitalization ,post acute hypoxemic respiratory failure due to opioid overdose, history of CVA with right-sided hemiparesis, diabetes, hypertension, NSTEMI, hypothyroidism, hyperlipidemia, chronic pain was evaluated by telemedicine today.  Patient today reports he continues to be undergoing [place].  He reports it is going well.  He denies any side effects at this time.  Patient however reports his mood symptoms especially depression is getting worse.  He reports he also has been having suicidality.  He however denies any plan at this time.  He agrees to go to the nearest emergency department if his suicidal thoughts worsen.  He reports he also struggles with sleep.  Patient reports he never received the prescription for trazodone which was sent out last  visit.  Patient reports he  has been taking more of the Seroquel- around 200 mg at bedtime and may probably run out soon.  Patient appeared to be alert, oriented to person place situation.  Writer contacted patient's wife [name] at [phone] concerns about patient's suicidality with his wife.  Wife to monitor him closely and take him to the nearest emergency department as needed.  Also spent time educating patient about staying on medications at the dosage that is prescribed and not to take more medication than what is prescribed which can be harmful to him.  Discussed with patient that he can be discharged from the clinic if he does not follow instructions. Visit Diagnosis:    ICD-10-CM   1. Major depressive disorder, recurrent, severe without psychotic features ([place])  F33.2 traZODone (DESYREL) 100 MG tablet  2. GAD (generalized anxiety disorder)  F41.1 traZODone (DESYREL) 100 MG tablet    QUEtiapine (SEROQUEL) 50 MG tablet    Past Psychiatric History: I have reviewed past psychiatric history from my progress note on 01/15/2019.  Past trials of fluvoxamine, Rexulti, Xanax, Abilify.  Patient with multiple inpatient mental health admissions.  Most recently he had back-to-back inpatient mental health admission in August 2020 and recently on September 17.  Patient did not tolerate ECT.  He is currently undergoing [place].  Past Medical History:  Past Medical History:  Diagnosis Date  . Depression   . Diabetes (HCC)    Insulin Pump  . Diabetes mellitus type I ([place])   . Diabetes mellitus without complication ([place])   . GERD (gastroesophageal reflux disease)   . H/O laryngectomy   . Heel bone  fracture   . Hyperlipidemia   . Hypertension   . Radicular pain of right lower extremity   . Stroke ([place])   . Suicide attempt [facility]) 2014   damaged larynx - tracheostomy  . Thyroid disease     Past Surgical History:  Procedure Laterality Date  . COLONOSCOPY WITH PROPOFOL N/A 05/15/2018    Procedure: COLONOSCOPY WITH PROPOFOL;  Surgeon: [name], [name], MD;  Location: [facility] ENDOSCOPY;  Service: Gastroenterology;  Laterality: N/A;  . ESOPHAGOGASTRODUODENOSCOPY N/A 05/15/2018   Procedure: ESOPHAGOGASTRODUODENOSCOPY (EGD);  Surgeon: [name], [name], MD;  Location: [facility] ENDOSCOPY;  Service: Gastroenterology;  Laterality: N/A;  . FRACTURE SURGERY    . Heel bone reconstruction Left   . HERNIA REPAIR  [id number]   Umbilical hernia repair   . LARYNGECTOMY    . NECK SURGERY     fusion  . SPINE SURGERY    . TRACHEOSTOMY  2014   from [place] attempt    Family Psychiatric History: I have reviewed family psychiatric history from my progress note on 01/15/2019.  Family History:  Family History  Problem Relation Age of Onset  . Osteoporosis Mother   . Diabetes Mother   . Hypertension Father   . Mental illness Neg Hx     Social History: Reviewed social history from my progress note on 01/15/2019. Social History   Socioeconomic History  . Marital status: Married    Spouse name: Not on file  . Number of children: Not on file  . Years of education: Not on file  . Highest education level: Not on file  Occupational History  . Not on file  Social Needs  . Financial resource strain: Not on file  . Food insecurity    Worry: Not on file    Inability: Not on file  . Transportation needs    Medical: Not on file    Non-medical: Not on file  Tobacco Use  . Smoking status: Former Smoker    Packs/day: 0.00    Types: Cigarettes    Quit date: 04/09/2013    Years since quitting: 6.0  . Smokeless tobacco: Never Used  Substance and Sexual Activity  . Alcohol use: Yes    Alcohol/week: 2.0 standard drinks    Types: 2 Shots of liquor per week    Comment: rare  . Drug use: No    Comment: Pt denied; UDS not available  . Sexual activity: Yes    Partners: Female    Birth control/protection: Condom  Lifestyle  . Physical activity    Days per week: Not on file    Minutes per session: Not on  file  . Stress: Not on file  Relationships  . Social Herbalist on phone: Not on file    Gets together: Not on file    Attends religious service: Not on file    Active member of club or organization: Not on file    Attends meetings of clubs or organizations: Not on file    Relationship status: Not on file  Other Topics Concern  . Not on file  Social History Narrative  . Not on file    Allergies:  Allergies  Allergen Reactions  . Buspar [Buspirone]     Makes the patient "flip out"  . Depakote [Valproic Acid]     Causes excessive drowsiness  . Clopidogrel Rash  . Gabapentin Itching and Rash    Metabolic Disorder Labs: Lab Results  Component Value Date   HGBA1C 7.8 (H)  01/01/2019   MPG 177.16 01/01/2019   MPG 174.29 12/23/2018   No results found for: PROLACTIN Lab Results  Component Value Date   CHOL 161 09/15/2016   TRIG 190 (H) 09/15/2016   HDL 41 09/15/2016   CHOLHDL 3.9 09/15/2016   VLDL 38 09/15/2016   LDLCALC 82 09/15/2016   Lab Results  Component Value Date   TSH 1.952 09/15/2016    Therapeutic Level Labs: No results found for: LITHIUM Lab Results  Component Value Date   VALPROATE 19 (L) 09/12/2016   No components found for:  CBMZ  Current Medications: Current Outpatient Medications  Medication Sig Dispense Refill  . albuterol (ACCUNEB) 1.25 MG/3ML nebulizer solution SMARTSIG:3 Milliliter(s) Via Nebulizer Every 6 Hours PRN    . amLODipine (NORVASC) 5 MG tablet Take 1 tablet (5 mg total) by mouth daily. 30 tablet 1  . aspirin EC 81 MG tablet Take 1 tablet (81 mg total) by mouth daily. 30 tablet 1  . buPROPion (WELLBUTRIN XL) 150 MG 24 hr tablet Take 1 tablet (150 mg total) by mouth daily. 90 tablet 0  . citalopram (CELEXA) 40 MG tablet Take 1 tablet (40 mg total) by mouth daily. 90 tablet 0  . empagliflozin (JARDIANCE) 25 MG TABS tablet Take 25 mg by mouth daily. 30 tablet 1  . hydrochlorothiazide (HYDRODIURIL) 25 MG tablet  hydrochlorothiazide 25 mg tablet    . insulin aspart (NOVOLOG) 100 UNIT/ML injection INJECT UP TO 120 UNITS UNDER THE SKIN AS DIRECTED DAILY VIA INSULIN PUMP    . insulin aspart (NOVOLOG) 100 UNIT/ML injection INJECT UP TO 120 UNITS UNDER THE SKIN UTD D VIA INSULIN PUMP    . levofloxacin (LEVAQUIN) 750 MG tablet Take 750 mg by mouth daily.    Marland Kitchen levothyroxine (SYNTHROID) 75 MCG tablet Take 1 tablet (75 mcg total) by mouth daily at 6 (six) AM. 30 tablet 1  . losartan-hydrochlorothiazide (HYZAAR) 100-25 MG tablet TAKE 1 TABLET BY MOUTH EVERY DAY    . QUEtiapine (SEROQUEL) 25 MG tablet Take 1 tablet (25 mg total) by mouth 2 (two) times daily as needed (anxiety). Daily as needed for severe anxiety and at bedtime as needed for sleep 60 tablet 1  . QUEtiapine (SEROQUEL) 50 MG tablet Take 1.5 tablets (75 mg total) by mouth at bedtime. 135 tablet 0  . simvastatin (ZOCOR) 20 MG tablet TAKE 1 TABLET(20 MG) BY MOUTH EVERY NIGHT    . traZODone (DESYREL) 100 MG tablet Take 1-2 tablets (100-200 mg total) by mouth at bedtime as needed for sleep. 180 tablet 0   No current facility-administered medications for this visit.      Musculoskeletal: Strength & Muscle Tone: UTA Gait & Station: normal Patient leans: N/A  Psychiatric Specialty Exam: Review of Systems  Endo/Heme/Allergies:       Reports feeling hot and cold  Psychiatric/Behavioral: Positive for depression and suicidal ideas. The patient has insomnia.   All other systems reviewed and are negative.   There were no vitals taken for this visit.There is no height or weight on file to calculate BMI.  General Appearance: Casual  Eye Contact:  Fair  Speech:  Slow has tracheostomy  Volume:  Normal  Mood:  Depressed  Affect:  Congruent  Thought Process:  Goal Directed and Descriptions of Associations: Intact  Orientation:  Full (Time, Place, and Person)  Thought Content: Logical   Suicidal Thoughts:  Yes.  without intent/plan  Homicidal Thoughts:   No  Memory:  Immediate;   Fair Recent;  Fair Remote;   Fair  Judgement:  Fair  Insight:  Fair  Psychomotor Activity:  Normal  Concentration:  Concentration: Fair and Attention Span: Fair  Recall:  [employer] of Knowledge: Fair  Language: Fair  Akathisia:  No  Handed:  Right  AIMS (if indicated): Denies tremors, rigidity  Assets:  Communication Skills Desire for Improvement Housing Intimacy Social Support  ADL's:  Intact  Cognition: WNL  Sleep:  Poor   Screenings: AUDIT     Admission (Discharged) from 01/22/2019 in [place] Admission (Discharged) from 12/31/2018 in [place] Admission (Discharged) from 09/14/2016 in [facility]  Alcohol Use Disorder Identification Test Final Score (AUDIT)  0  0  1    ECT-MADRS     Admission (Discharged) from 01/22/2019 in [place] Total Score  27    Mini-Mental     Admission (Discharged) from 01/22/2019 in [facility] [place]  Total Score (max 30 points )  30    PHQ2-9     Office Visit from 03/13/2019 in [facility] Office Visit from 02/20/2019 in [facility] Office Visit from 02/10/2019 in [facility] Patient Outreach Telephone from 12/12/2017 in [facility] from 08/22/2016 in [facility] [place]  PHQ-2 Total Score  4  6  3   0  2  PHQ-9 Total Score  16  21  13   -  4       Assessment and Plan: Weslee is a 58 year old Caucasian male, married, on disability, has a history of multiple medical problems, MDD, ADHD was evaluated by telemedicine today.  Patient is biologically predisposed given his multiple health issues.  He also has psychosocial stressors including his physical limitations, chronic mental health problems, current pandemic.  Patient with recent inpatient mental health admissions is  currently undergoing [place].  Patient however continues to struggle with depressive symptoms and will benefit from medication readjustment as well as psychotherapy sessions.  Plan as noted below. Risk factors for suicide-depressive symptoms and other chronic mental health problems, past history of suicide attempts, multiple health issues, physical limitations, gender, race, recent suicidal thoughts. The following are the positive factors-he has good social support system, his wife agrees to supervise him closely and get him help if he needs it, patient denies any plan at this time and agrees to get help and appears to be motivated to get treatment, patient is currently undergoing treatment for depression and is compliant and is also undergoing [place]. Hence the acute risk for suicide at this time is low.  Plan MDD-unstable Celexa 40 mg p.o. daily Increase Seroquel to 75 mg p.o. nightly Seroquel 25 mg p.o. twice daily as needed-reduced dosage. Increase trazodone to 200 mg p.o. nightly as needed for sleep Wellbutrin XL 150 mg p.o. daily  GAD-improving Celexa as prescribed Patient advised to connect with psychotherapist-he is currently working on getting established. Seroquel 25 mg p.o. twice daily as needed for anxiety symptoms-reduced dosage  Patient with physical symptoms of feeling hot and cold-was taken off of the Cytomel recently.  Patient continues to struggle with physical symptoms and advised him to contact his primary care provider for further assistance.  Patient will also benefit from follow-up labs-TSH.  Provided medication education-advised patient not to increase the dosages of medication himself and contact the clinic if he needs help.  I have also communicated with his wife Ellard Artis at [phone] as summarized above.  Crisis plan discussed with patient as well as wife.  Patient to go to the nearest emergency department if his suicidal thoughts or depressive symptoms  worsen.   Follow-up in clinic in 3 weeks or sooner if needed.  Patient however is going out of town the last week of December and agrees to return for an appointment on January 4 at 2:30 PM  I have spent atleast 25 minutes non face to face with patient today. More than 50 % of the time was spent for psychoeducation and supportive psychotherapy and care coordination. This note was generated in part or whole with voice recognition software. Voice recognition is usually quite accurate but there are transcription errors that can and very often do occur. I apologize for any typographical errors that were not detected and corrected.      Ursula Alert, MD 04/14/2019, 2:31 PM

## 2019-04-15 ENCOUNTER — Encounter (HOSPITAL_COMMUNITY): Payer: PPO | Admitting: Emergency Medicine

## 2019-04-16 ENCOUNTER — Other Ambulatory Visit (INDEPENDENT_AMBULATORY_CARE_PROVIDER_SITE_OTHER): Payer: PPO | Admitting: Emergency Medicine

## 2019-04-16 ENCOUNTER — Other Ambulatory Visit: Payer: Self-pay

## 2019-04-16 ENCOUNTER — Encounter (HOSPITAL_COMMUNITY): Payer: PPO | Admitting: Emergency Medicine

## 2019-04-16 DIAGNOSIS — F332 Major depressive disorder, recurrent severe without psychotic features: Secondary | ICD-10-CM

## 2019-04-16 NOTE — Progress Notes (Signed)
Patient reported to [facility] for Repetitive Transcranial Magnetic Stimulation treatment for severe episode of recurrent major depressive disorder, without psychotic features. Patient presented with appropriate affect, level mood and denied any suicidal or homicidal ideations. Patient denies any other current symptoms and remains optimistic with continued [place] treatment. Patient reported no change in alcohol/substance use, caffeine consumption, sleep pattern or metal implant status since previous tx. Patient reported to tx session with pleasant affect.Hewatched TV.Pt requested meditation music. Pt has not been feeling well due to medication changes. He did not report to tx Monday or Tuesday. Dr. Alejandro Mulling was notified of his absence. She stated that if he did not continue with [place], he will be admitted. Writer spoke with pt's wife yesterday morning. Wife reported that pt has been sleeping all day. Pt shared he feels much better today.Powerwas titrated to120% for the duration of txand will remain there until patient gets adjusted. Patient reported no complaints or discomfort. Patientdeparted post-treatment with no concerns or complaints.

## 2019-04-17 ENCOUNTER — Other Ambulatory Visit: Payer: Self-pay

## 2019-04-17 ENCOUNTER — Other Ambulatory Visit (INDEPENDENT_AMBULATORY_CARE_PROVIDER_SITE_OTHER): Payer: PPO | Admitting: Emergency Medicine

## 2019-04-17 DIAGNOSIS — F332 Major depressive disorder, recurrent severe without psychotic features: Secondary | ICD-10-CM | POA: Diagnosis not present

## 2019-04-17 NOTE — Progress Notes (Signed)
Patient reported to [facility] for Repetitive Transcranial Magnetic Stimulation treatment for severe episode of recurrent major depressive disorder, without psychotic features. Patient presented with appropriate affect, level mood and denied any suicidal or homicidal ideations. Patient denies any other current symptoms and remains optimistic with continued [place] treatment. Patient reported no change in alcohol/substance use, caffeine consumption, sleep pattern or metal implant status since previous tx. Patient reported to tx session with pleasant affect.Hewatched TV.Pt requested meditation music.Powerwas titrated to120% for the duration of txand will remain there until patient gets adjusted. Patient reported no complaints or discomfort. Patientdeparted post-treatment with no concerns or complaints.

## 2019-04-18 ENCOUNTER — Other Ambulatory Visit: Payer: Self-pay | Admitting: Psychiatry

## 2019-04-18 ENCOUNTER — Other Ambulatory Visit (INDEPENDENT_AMBULATORY_CARE_PROVIDER_SITE_OTHER): Payer: PPO | Admitting: Emergency Medicine

## 2019-04-18 ENCOUNTER — Other Ambulatory Visit: Payer: Self-pay

## 2019-04-18 DIAGNOSIS — F332 Major depressive disorder, recurrent severe without psychotic features: Secondary | ICD-10-CM

## 2019-04-18 NOTE — Progress Notes (Signed)
Patient reported to [facility] for Repetitive Transcranial Magnetic Stimulation treatment for severe episode of recurrent major depressive disorder, without psychotic features. Patient presented with appropriate affect, level mood and denied any suicidal or homicidal ideations. Patient denies any other current symptoms and remains optimistic with continued [place] treatment. Patient reported no change in alcohol/substance use, caffeine consumption, sleep pattern or metal implant status since previous tx. Patient reported to tx session with pleasant affect.Hewatched TV.Pt requested meditation music.Powerwas titrated to120% for the duration of txand will remain there until patient gets adjusted. Patient reported no complaints or discomfort. Patientdeparted post-treatment with no concerns or complaints.

## 2019-04-19 DIAGNOSIS — E1029 Type 1 diabetes mellitus with other diabetic kidney complication: Secondary | ICD-10-CM | POA: Diagnosis not present

## 2019-04-19 DIAGNOSIS — R809 Proteinuria, unspecified: Secondary | ICD-10-CM | POA: Diagnosis not present

## 2019-04-20 DIAGNOSIS — J4530 Mild persistent asthma, uncomplicated: Secondary | ICD-10-CM | POA: Diagnosis not present

## 2019-04-21 ENCOUNTER — Other Ambulatory Visit (INDEPENDENT_AMBULATORY_CARE_PROVIDER_SITE_OTHER): Payer: PPO | Admitting: Emergency Medicine

## 2019-04-21 ENCOUNTER — Other Ambulatory Visit: Payer: Self-pay

## 2019-04-21 DIAGNOSIS — F332 Major depressive disorder, recurrent severe without psychotic features: Secondary | ICD-10-CM | POA: Diagnosis not present

## 2019-04-21 NOTE — Progress Notes (Signed)
Patient reported to [facility] for Repetitive Transcranial Magnetic Stimulation treatment for severe episode of recurrent major depressive disorder, without psychotic features. Patient presented with appropriate affect, level mood and denied any suicidal or homicidal ideations. Patient denies any other current symptoms and remains optimistic with continued [place] treatment. Patient reported no change in alcohol/substance use, caffeine consumption, sleep pattern or metal implant status since previous tx. Patient reported to tx session with pleasant affect.Hewatched TV.Pt requested meditation music.Powerwas titrated to120% for the duration of txand will remain there until patient gets adjusted. Patient reported no complaints or discomfort. Patientdeparted post-treatment with no concerns or complaints.

## 2019-04-22 ENCOUNTER — Encounter (HOSPITAL_COMMUNITY): Payer: PPO | Admitting: Emergency Medicine

## 2019-04-23 ENCOUNTER — Other Ambulatory Visit: Payer: Self-pay

## 2019-04-23 ENCOUNTER — Other Ambulatory Visit (INDEPENDENT_AMBULATORY_CARE_PROVIDER_SITE_OTHER): Payer: PPO | Admitting: Emergency Medicine

## 2019-04-23 DIAGNOSIS — F332 Major depressive disorder, recurrent severe without psychotic features: Secondary | ICD-10-CM | POA: Diagnosis not present

## 2019-04-23 NOTE — Progress Notes (Signed)
Patient reported to [facility] for Repetitive Transcranial Magnetic Stimulation treatment for severe episode of recurrent major depressive disorder, without psychotic features. Patient presented with appropriate affect, level mood and denied any suicidal or homicidal ideations. Patient denies any other current symptoms and remains optimistic with continued [place] treatment. Patient reported no change in alcohol/substance use, caffeine consumption, sleep pattern or metal implant status since previous tx. Patient reported to tx session with pleasant affect.Hewatched TV.Pt requested meditation music.Powerwas titrated to120% for the duration of txand will remain there until patient gets adjusted. Patient reported no complaints or discomfort. Patientdeparted post-treatment with no concerns or complaints.

## 2019-04-24 ENCOUNTER — Other Ambulatory Visit (INDEPENDENT_AMBULATORY_CARE_PROVIDER_SITE_OTHER): Payer: PPO | Admitting: Emergency Medicine

## 2019-04-24 ENCOUNTER — Other Ambulatory Visit: Payer: Self-pay

## 2019-04-24 DIAGNOSIS — F332 Major depressive disorder, recurrent severe without psychotic features: Secondary | ICD-10-CM | POA: Diagnosis not present

## 2019-04-24 NOTE — Progress Notes (Signed)
Patient reported to [facility] for Repetitive Transcranial Magnetic Stimulation treatment for severe episode of recurrent major depressive disorder, without psychotic features. Patient presented with appropriate affect, level mood and denied any suicidal or homicidal ideations. Patient denies any other current symptoms and remains optimistic with continued [place] treatment. Patient reported no change in alcohol/substance use, caffeine consumption, sleep pattern or metal implant status since previous tx. Patient reported to tx session with pleasant affect.Hewatched TV. Pt came into session in a elevated and happy mood. He received a phone call with a new job opportunity. He is excited about this new opening chapter. He believe this external factor will help with his mood. Pt requested meditation music.Powerwas titrated to120% for the duration of txand will remain there until patient gets adjusted. Patient reported no complaints or discomfort. Patientdeparted post-treatment with no concerns or complaints.

## 2019-04-25 ENCOUNTER — Other Ambulatory Visit (INDEPENDENT_AMBULATORY_CARE_PROVIDER_SITE_OTHER): Payer: PPO | Admitting: Emergency Medicine

## 2019-04-25 ENCOUNTER — Other Ambulatory Visit: Payer: Self-pay

## 2019-04-25 DIAGNOSIS — F332 Major depressive disorder, recurrent severe without psychotic features: Secondary | ICD-10-CM | POA: Diagnosis not present

## 2019-04-25 NOTE — Progress Notes (Signed)
Patient reported to [facility] for Repetitive Transcranial Magnetic Stimulation treatment for severe episode of recurrent major depressive disorder, without psychotic features. Patient presented with appropriate affect, level mood and denied any suicidal or homicidal ideations. Patient denies any other current symptoms and remains optimistic with continued [place] treatment. Patient reported no change in alcohol/substance use, caffeine consumption, sleep pattern or metal implant status since previous tx. Patient reported to tx session with pleasant affect.Hewatched TV. Pt came into session in a elevated and happy mood. He received a phone call with a new job opportunity. He is excited about this new opening chapter. He believe this external factor will help with his mood.Pt has been more talkative and making small jokes. He is high spirits when he comes to tx sessions. Powerwas titrated to120% for the duration of txand will remain there until patient gets adjusted. Patient reported no complaints or discomfort. Patientdeparted post-treatment with no concerns or complaints.

## 2019-04-28 ENCOUNTER — Other Ambulatory Visit (INDEPENDENT_AMBULATORY_CARE_PROVIDER_SITE_OTHER): Payer: PPO | Admitting: Emergency Medicine

## 2019-04-28 ENCOUNTER — Other Ambulatory Visit: Payer: Self-pay

## 2019-04-28 DIAGNOSIS — F332 Major depressive disorder, recurrent severe without psychotic features: Secondary | ICD-10-CM | POA: Diagnosis not present

## 2019-04-28 NOTE — Progress Notes (Signed)
Patient reported to [facility] for Repetitive Transcranial Magnetic Stimulation treatment for severe episode of recurrent major depressive disorder, without psychotic features. Patient presented with appropriate affect, level mood and denied any suicidal or homicidal ideations. Patient denies any other current symptoms and remains optimistic with continued [place] treatment. Patient reported no change in alcohol/substance use, caffeine consumption, sleep pattern or metal implant status since previous tx. Patient reported to tx session with pleasant affect.Hewatched TV.Pt came into session in a elevated and happy mood. He received a phone call with a new job opportunity. He is excited about this new opening chapter. He believe this external factor will help with his mood.Pt has been more talkative and making small jokes. He is high spirits when he comes to tx sessions. Powerwas titrated to120% for the duration of txand will remain there until patient gets adjusted. Patient reported no complaints or discomfort. Patientdeparted post-treatment with no concerns or complaints.

## 2019-04-29 ENCOUNTER — Other Ambulatory Visit: Payer: Self-pay

## 2019-04-29 ENCOUNTER — Other Ambulatory Visit (INDEPENDENT_AMBULATORY_CARE_PROVIDER_SITE_OTHER): Payer: PPO | Admitting: Emergency Medicine

## 2019-04-29 DIAGNOSIS — F332 Major depressive disorder, recurrent severe without psychotic features: Secondary | ICD-10-CM | POA: Diagnosis not present

## 2019-04-29 NOTE — Progress Notes (Signed)
Patient reported to [facility] for Repetitive Transcranial Magnetic Stimulation treatment for severe episode of recurrent major depressive disorder, without psychotic features. Patient presented with appropriate affect, level mood and denied any suicidal or homicidal ideations. Patient denies any other current symptoms and remains optimistic with continued [place] treatment. Patient reported no change in alcohol/substance use, caffeine consumption, sleep pattern or metal implant status since previous tx. Patient reported to tx session with pleasant affect.Hewatched TV.Pt came into session in a elevated and happy mood. He received a phone call with a new job opportunity. He is excited about this new opening chapter. He believe this external factor will help with his mood.Pt has been more talkative and making small jokes. He is high spirits when he comes to tx sessions.Powerwas titrated to120% for the duration of txand will remain there until patient gets adjusted. Patient reported no complaints or discomfort. Patientdeparted post-treatment with no concerns or complaints.

## 2019-04-30 ENCOUNTER — Other Ambulatory Visit: Payer: Self-pay

## 2019-04-30 ENCOUNTER — Other Ambulatory Visit (INDEPENDENT_AMBULATORY_CARE_PROVIDER_SITE_OTHER): Payer: PPO | Admitting: Emergency Medicine

## 2019-04-30 DIAGNOSIS — F332 Major depressive disorder, recurrent severe without psychotic features: Secondary | ICD-10-CM

## 2019-04-30 NOTE — Progress Notes (Signed)
Patient reported to [facility] for Repetitive Transcranial Magnetic Stimulation treatment for severe episode of recurrent major depressive disorder, without psychotic features. Patient presented with appropriate affect, level mood and denied any suicidal or homicidal ideations. Patient denies any other current symptoms and remains optimistic with continued [place] treatment. Patient reported no change in alcohol/substance use, caffeine consumption, sleep pattern or metal implant status since previous tx. Patient reported to tx session with pleasant affect.Hewatched TV.Pt is more joyful and happy when he comes into tx session. He makes jokes with Probation officer and shares his day with Probation officer. Powerwas titrated to120% for the duration of txand will remain there until patient gets adjusted. Patient reported no complaints or discomfort. Patientdeparted post-treatment with no concerns or complaints.

## 2019-05-01 ENCOUNTER — Other Ambulatory Visit (INDEPENDENT_AMBULATORY_CARE_PROVIDER_SITE_OTHER): Payer: PPO | Admitting: Emergency Medicine

## 2019-05-01 ENCOUNTER — Other Ambulatory Visit: Payer: Self-pay

## 2019-05-01 DIAGNOSIS — F332 Major depressive disorder, recurrent severe without psychotic features: Secondary | ICD-10-CM | POA: Diagnosis not present

## 2019-05-01 NOTE — Progress Notes (Signed)
Patient reported to [facility] for Repetitive Transcranial Magnetic Stimulation treatment for severe episode of recurrent major depressive disorder, without psychotic features. Patient presented with appropriate affect, level mood and denied any suicidal or homicidal ideations. Patient denies any other current symptoms and remains optimistic with continued [place] treatment. Patient reported no change in alcohol/substance use, caffeine consumption, sleep pattern or metal implant status since previous tx. Patient reported to tx session with pleasant affect.Hewatched TV.Pt is more joyful and happy when he comes into tx session. He makes jokes with Probation officer and shares his day with Probation officer. Powerwas titrated to120% for the duration of txand will remain there until patient gets adjusted. Patient reported no complaints or discomfort. Patientdeparted post-treatment with no concerns or complaints.

## 2019-05-05 ENCOUNTER — Other Ambulatory Visit: Payer: Self-pay

## 2019-05-05 ENCOUNTER — Other Ambulatory Visit (INDEPENDENT_AMBULATORY_CARE_PROVIDER_SITE_OTHER): Payer: PPO | Admitting: Emergency Medicine

## 2019-05-05 DIAGNOSIS — F332 Major depressive disorder, recurrent severe without psychotic features: Secondary | ICD-10-CM

## 2019-05-05 NOTE — Progress Notes (Signed)
Patient reported to [facility] for Repetitive Transcranial Magnetic Stimulation treatment for severe episode of recurrent major depressive disorder, without psychotic features. Patient presented with appropriate affect, level mood and denied any suicidal or homicidal ideations. Patient denies any other current symptoms and remains optimistic with continued [place] treatment. Patient reported no change in alcohol/substance use, caffeine consumption, sleep pattern or metal implant status since previous tx. Patient reported to tx session with pleasant affect.Hewatched TV.Pt expressed that he had a great Christmas with his family. He shared that it was much different compard to last year but he made the best of it. He is extremely happy that [place] has been improving his quality of life. Powerwas titrated to120% for the duration of txand will remain there until patient gets adjusted. Patient reported no complaints or discomfort. Patientdeparted post-treatment with no concerns or complaints.

## 2019-05-06 ENCOUNTER — Other Ambulatory Visit: Payer: Self-pay

## 2019-05-06 ENCOUNTER — Other Ambulatory Visit (INDEPENDENT_AMBULATORY_CARE_PROVIDER_SITE_OTHER): Payer: PPO | Admitting: Emergency Medicine

## 2019-05-06 ENCOUNTER — Other Ambulatory Visit: Payer: Self-pay | Admitting: Psychiatry

## 2019-05-06 DIAGNOSIS — F332 Major depressive disorder, recurrent severe without psychotic features: Secondary | ICD-10-CM | POA: Diagnosis not present

## 2019-05-06 DIAGNOSIS — F411 Generalized anxiety disorder: Secondary | ICD-10-CM

## 2019-05-06 NOTE — Progress Notes (Signed)
Patient reported to [facility] for Repetitive Transcranial Magnetic Stimulation treatment for severe episode of recurrent major depressive disorder, without psychotic features. Patient presented with appropriate affect, level mood and denied any suicidal or homicidal ideations. Patient denies any other current symptoms and remains optimistic with continued [place] treatment. Patient reported no change in alcohol/substance use, caffeine consumption, sleep pattern or metal implant status since previous tx. Patient reported to tx session with pleasant affect.Hewatched TV.Pt is more joyful and happy when he comes into tx session. He makes jokes with Probation officer and shares his day with Probation officer.Powerwas titrated to120% for the duration of txand will remain there until patient gets adjusted. Patient reported no complaints or discomfort. Patientdeparted post-treatment with no concerns or complaints.

## 2019-05-07 ENCOUNTER — Other Ambulatory Visit (INDEPENDENT_AMBULATORY_CARE_PROVIDER_SITE_OTHER): Payer: PPO | Admitting: Emergency Medicine

## 2019-05-07 ENCOUNTER — Other Ambulatory Visit: Payer: Self-pay

## 2019-05-07 DIAGNOSIS — F332 Major depressive disorder, recurrent severe without psychotic features: Secondary | ICD-10-CM

## 2019-05-07 NOTE — Progress Notes (Signed)
Patient reported to [facility] for Repetitive Transcranial Magnetic Stimulation treatment for severe episode of recurrent major depressive disorder, without psychotic features. Patient presented with appropriate affect, level mood and denied any suicidal or homicidal ideations. Patient denies any other current symptoms and remains optimistic with continued [place] treatment. Patient reported no change in alcohol/substance use, caffeine consumption, sleep pattern or metal implant status since previous tx. Patient reported to tx session with pleasant affect.Pt shared that he has been in a great mood the past few days. He is excited about his new job. Powerwas titrated to120% for the duration of txand will remain there until patient gets adjusted. Patient reported no complaints or discomfort. Patientdeparted post-treatment with no concerns or complaints.

## 2019-05-08 DIAGNOSIS — E1029 Type 1 diabetes mellitus with other diabetic kidney complication: Secondary | ICD-10-CM | POA: Diagnosis not present

## 2019-05-08 DIAGNOSIS — R809 Proteinuria, unspecified: Secondary | ICD-10-CM | POA: Diagnosis not present

## 2019-05-12 ENCOUNTER — Ambulatory Visit (INDEPENDENT_AMBULATORY_CARE_PROVIDER_SITE_OTHER): Payer: PPO | Admitting: Psychiatry

## 2019-05-12 ENCOUNTER — Other Ambulatory Visit (HOSPITAL_COMMUNITY): Payer: PPO | Attending: Psychiatry | Admitting: Emergency Medicine

## 2019-05-12 ENCOUNTER — Other Ambulatory Visit: Payer: Self-pay

## 2019-05-12 ENCOUNTER — Encounter: Payer: Self-pay | Admitting: Psychiatry

## 2019-05-12 DIAGNOSIS — Z7982 Long term (current) use of aspirin: Secondary | ICD-10-CM | POA: Diagnosis not present

## 2019-05-12 DIAGNOSIS — I10 Essential (primary) hypertension: Secondary | ICD-10-CM | POA: Insufficient documentation

## 2019-05-12 DIAGNOSIS — F332 Major depressive disorder, recurrent severe without psychotic features: Secondary | ICD-10-CM

## 2019-05-12 DIAGNOSIS — I252 Old myocardial infarction: Secondary | ICD-10-CM | POA: Diagnosis not present

## 2019-05-12 DIAGNOSIS — Z79899 Other long term (current) drug therapy: Secondary | ICD-10-CM | POA: Diagnosis not present

## 2019-05-12 DIAGNOSIS — Z87891 Personal history of nicotine dependence: Secondary | ICD-10-CM | POA: Diagnosis not present

## 2019-05-12 DIAGNOSIS — F3341 Major depressive disorder, recurrent, in partial remission: Secondary | ICD-10-CM | POA: Insufficient documentation

## 2019-05-12 DIAGNOSIS — F411 Generalized anxiety disorder: Secondary | ICD-10-CM | POA: Insufficient documentation

## 2019-05-12 DIAGNOSIS — Z794 Long term (current) use of insulin: Secondary | ICD-10-CM | POA: Insufficient documentation

## 2019-05-12 DIAGNOSIS — E108 Type 1 diabetes mellitus with unspecified complications: Secondary | ICD-10-CM | POA: Diagnosis not present

## 2019-05-12 DIAGNOSIS — E785 Hyperlipidemia, unspecified: Secondary | ICD-10-CM | POA: Diagnosis not present

## 2019-05-12 DIAGNOSIS — Z915 Personal history of self-harm: Secondary | ICD-10-CM | POA: Diagnosis not present

## 2019-05-12 DIAGNOSIS — F339 Major depressive disorder, recurrent, unspecified: Secondary | ICD-10-CM | POA: Diagnosis present

## 2019-05-12 MED ORDER — QUETIAPINE FUMARATE 100 MG PO TABS: 100.0000 mg | ORAL_TABLET | Freq: Every day | ORAL | 0 refills | Status: DC

## 2019-05-12 NOTE — Progress Notes (Addendum)
Virtual Visit via Video Note  I connected with [name] on 05/13/19 at  2:30 PM EST by a video enabled telemedicine application and verified that I am speaking with the correct person using two identifiers.   I discussed the limitations of evaluation and management by telemedicine and the availability of in person appointments. The patient expressed understanding and agreed to proceed.     I discussed the assessment and treatment plan with the patient. The patient was provided an opportunity to ask questions and all were answered. The patient agreed with the plan and demonstrated an understanding of the instructions.   The patient was advised to call back or seek an in-person evaluation if the symptoms worsen or if the condition fails to improve as anticipated.   Blue Ball MD OP Progress Note  05/13/2019 12:45 PM [name]  MRN:  [id number]  Chief Complaint:  Chief Complaint    Follow-up     HPI: [name] is a 59 year old Caucasian male, married, lives in [place], has a history of depression, GAD, multiple medical problems including history of tracheostomy, status post multiple intubation all during one hospitalization, post acute hypoxemic respiratory failure due to opioid overdose history of CVA with right-sided hemiparesis, diabetes, hypertension, NSTEMI, hypothyroidism, hyperlipidemia, chronic pain was evaluated by telemedicine today.  Patient today reports he continues to be undergoing [place].  He has 2 more sessions left to complete it.  Patient today reports his depressive symptoms as improved a lot.  He has not felt this good in a long time.  He denies any suicidality.  Patient reports he is compliant on medications as prescribed.  He reports his sleep is improved however he continues to struggle with difficulty with falling asleep.  Patient reports he goes to bed at around 9 AM however can only fall asleep by around 3 AM.  Patient reports he continues to have racing  thoughts at night.  However once he falls asleep he is able to sleep good until around 9 AM in the morning.  He gets around 6 hours of sleep which is an improvement for him.  He also continues to take Seroquel as well as trazodone.  He denies side effects.  Patient has not been able to follow-up with her therapist until now.  Will send his referral to coordinator here.  He agrees with plan.  Visit Diagnosis:    ICD-10-CM   1. MDD (major depressive disorder), recurrent, in partial remission (HCC)  F33.41 QUEtiapine (SEROQUEL) 100 MG tablet  2. GAD (generalized anxiety disorder)  F41.1 QUEtiapine (SEROQUEL) 100 MG tablet    Past Psychiatric History: I have reviewed past psychiatric history from my progress note on 01/15/2019.  Past trials of fluvoxamine, Rexulti, Xanax, Abilify.  Patient with multiple inpatient mental health admissions.  Most recently he had back-to-back inpatient mental health admission in August 2020 and recently on September 17.  Patient did not tolerate ECT.  He is currently undergoing [place].  Past Medical History:  Past Medical History:  Diagnosis Date  . Depression   . Diabetes (HCC)    Insulin Pump  . Diabetes mellitus type I ([place])   . Diabetes mellitus without complication ([place])   . GERD (gastroesophageal reflux disease)   . H/O laryngectomy   . Heel bone fracture   . Hyperlipidemia   . Hypertension   . Radicular pain of right lower extremity   . Stroke ([place])   . Suicide attempt [facility]) 2014   damaged larynx - tracheostomy  .  Thyroid disease     Past Surgical History:  Procedure Laterality Date  . COLONOSCOPY WITH PROPOFOL N/A 05/15/2018   Procedure: COLONOSCOPY WITH PROPOFOL;  Surgeon: [name], [name], MD;  Location: [facility] ENDOSCOPY;  Service: Gastroenterology;  Laterality: N/A;  . ESOPHAGOGASTRODUODENOSCOPY N/A 05/15/2018   Procedure: ESOPHAGOGASTRODUODENOSCOPY (EGD);  Surgeon: [name], [name], MD;  Location: [facility] ENDOSCOPY;  Service: Gastroenterology;   Laterality: N/A;  . FRACTURE SURGERY    . Heel bone reconstruction Left   . HERNIA REPAIR  [id number]   Umbilical hernia repair   . LARYNGECTOMY    . NECK SURGERY     fusion  . SPINE SURGERY    . TRACHEOSTOMY  2014   from [place] attempt    Family Psychiatric History: I have reviewed family psychiatric history from my progress note on 01/15/2019.  Family History:  Family History  Problem Relation Age of Onset  . Osteoporosis Mother   . Diabetes Mother   . Hypertension Father   . Mental illness Neg Hx     Social History: I have reviewed social history from my progress note on 01/15/2019. Social History   Socioeconomic History  . Marital status: Married    Spouse name: Not on file  . Number of children: Not on file  . Years of education: Not on file  . Highest education level: Not on file  Occupational History  . Not on file  Tobacco Use  . Smoking status: Former Smoker    Packs/day: 0.00    Types: Cigarettes    Quit date: 04/09/2013    Years since quitting: 6.0  . Smokeless tobacco: Never Used  Substance and Sexual Activity  . Alcohol use: Yes    Alcohol/week: 2.0 standard drinks    Types: 2 Shots of liquor per week    Comment: rare  . Drug use: No    Comment: Pt denied; UDS not available  . Sexual activity: Yes    Partners: Female    Birth control/protection: Condom  Other Topics Concern  . Not on file  Social History Narrative  . Not on file   Social Determinants of Health   Financial Resource Strain:   . Difficulty of Paying Living Expenses: Not on file  Food Insecurity:   . Worried About Charity fundraiser in the Last Year: Not on file  . Ran Out of Food in the Last Year: Not on file  Transportation Needs:   . Lack of Transportation (Medical): Not on file  . Lack of Transportation (Non-Medical): Not on file  Physical Activity:   . Days of Exercise per Week: Not on file  . Minutes of Exercise per Session: Not on file  Stress:   . Feeling of Stress : Not on  file  Social Connections:   . Frequency of Communication with Friends and Family: Not on file  . Frequency of Social Gatherings with Friends and Family: Not on file  . Attends Religious Services: Not on file  . Active Member of Clubs or Organizations: Not on file  . Attends Archivist Meetings: Not on file  . Marital Status: Not on file    Allergies:  Allergies  Allergen Reactions  . Buspar [Buspirone]     Makes the patient "flip out"  . Depakote [Valproic Acid]     Causes excessive drowsiness  . Clopidogrel Rash  . Gabapentin Itching and Rash    Metabolic Disorder Labs: Lab Results  Component Value Date   HGBA1C 7.8 (H)  01/01/2019   MPG 177.16 01/01/2019   MPG 174.29 12/23/2018   No results found for: PROLACTIN Lab Results  Component Value Date   CHOL 161 09/15/2016   TRIG 190 (H) 09/15/2016   HDL 41 09/15/2016   CHOLHDL 3.9 09/15/2016   VLDL 38 09/15/2016   LDLCALC 82 09/15/2016   Lab Results  Component Value Date   TSH 1.952 09/15/2016    Therapeutic Level Labs: No results found for: LITHIUM Lab Results  Component Value Date   VALPROATE 19 (L) 09/12/2016   No components found for:  CBMZ  Current Medications: Current Outpatient Medications  Medication Sig Dispense Refill  . pantoprazole (PROTONIX) 40 MG tablet TAKE 1 TABLET(40 MG) BY MOUTH TWICE DAILY    . albuterol (ACCUNEB) 1.25 MG/3ML nebulizer solution SMARTSIG:3 Milliliter(s) Via Nebulizer Every 6 Hours PRN    . amLODipine (NORVASC) 5 MG tablet Take 1 tablet (5 mg total) by mouth daily. 30 tablet 1  . aspirin EC 81 MG tablet Take 1 tablet (81 mg total) by mouth daily. 30 tablet 1  . buPROPion (WELLBUTRIN XL) 150 MG 24 hr tablet Take 1 tablet (150 mg total) by mouth daily. 90 tablet 0  . ciprofloxacin (CIPRO) 250 MG tablet Take 250 mg by mouth 2 (two) times daily.    . citalopram (CELEXA) 40 MG tablet Take 1 tablet (40 mg total) by mouth daily. 90 tablet 0  . Continuous Blood Gluc Receiver  (FREESTYLE LIBRE 14 DAY READER) DEVI FreeStyle Libre 14 Day Reader    . Continuous Blood Gluc Sensor (FREESTYLE LIBRE 14 DAY SENSOR) MISC FreeStyle Libre 14 Day Sensor kit    . empagliflozin (JARDIANCE) 25 MG TABS tablet Take 25 mg by mouth daily. 30 tablet 1  . hydrochlorothiazide (HYDRODIURIL) 25 MG tablet hydrochlorothiazide 25 mg tablet    . insulin aspart (NOVOLOG) 100 UNIT/ML injection INJECT UP TO 120 UNITS UNDER THE SKIN AS DIRECTED DAILY VIA INSULIN PUMP    . insulin aspart (NOVOLOG) 100 UNIT/ML injection INJECT UP TO 120 UNITS UNDER THE SKIN UTD D VIA INSULIN PUMP    . levofloxacin (LEVAQUIN) 750 MG tablet Take 750 mg by mouth daily.    Marland Kitchen levothyroxine (SYNTHROID) 75 MCG tablet TAKE 1 TABLET BY MOUTH DAILY AT 6 AM 90 tablet 0  . losartan-hydrochlorothiazide (HYZAAR) 100-25 MG tablet TAKE 1 TABLET BY MOUTH EVERY DAY    . pantoprazole (PROTONIX) 40 MG tablet     . QUEtiapine (SEROQUEL) 100 MG tablet Take 1 tablet (100 mg total) by mouth at bedtime. Mood and sleep 90 tablet 0  . QUEtiapine (SEROQUEL) 25 MG tablet Take 1 tablet (25 mg total) by mouth 2 (two) times daily as needed (anxiety). Daily as needed for severe anxiety and at bedtime as needed for sleep 60 tablet 1  . simvastatin (ZOCOR) 20 MG tablet TAKE 1 TABLET(20 MG) BY MOUTH EVERY NIGHT    . traMADol (ULTRAM) 50 MG tablet Take 100 mg by mouth every 6 (six) hours as needed.    . traZODone (DESYREL) 100 MG tablet Take 1-2 tablets (100-200 mg total) by mouth at bedtime as needed for sleep. 180 tablet 0   No current facility-administered medications for this visit.     Musculoskeletal: Strength & Muscle Tone: UTA Gait & Station: normal Patient leans: N/A  Psychiatric Specialty Exam: Review of Systems  Psychiatric/Behavioral: Positive for sleep disturbance.  All other systems reviewed and are negative.   There were no vitals taken for this visit.There is no  height or weight on file to calculate BMI.  General Appearance:  Casual  Eye Contact:  Fair  Speech:  Slow has tracheostomy  Volume:  Normal  Mood:  Euthymic  Affect:  Congruent  Thought Process:  Goal Directed and Descriptions of Associations: Intact  Orientation:  Full (Time, Place, and Person)  Thought Content: Logical   Suicidal Thoughts:  No  Homicidal Thoughts:  No  Memory:  Immediate;   Fair Recent;   Fair Remote;   Fair  Judgement:  Fair  Insight:  Fair  Psychomotor Activity:  Normal  Concentration:  Concentration: Fair and Attention Span: Fair  Recall:  [employer] of Knowledge: Fair  Language: Fair  Akathisia:  No  Handed:  Right  AIMS (if indicated): denies tremors, rigidity  Assets:  Communication Skills Desire for Improvement Housing Social Support  ADL's:  Intact  Cognition: WNL  Sleep:  Improving - however has difficulty falling asleep   Screenings: AUDIT     Admission (Discharged) from 01/22/2019 in [facility] Admission (Discharged) from 12/31/2018 in [place] Admission (Discharged) from 09/14/2016 in [place]  Alcohol Use Disorder Identification Test Final Score (AUDIT)  0  0  1    ECT-MADRS     Admission (Discharged) from 01/22/2019 in [place] Total Score  27    Mini-Mental     Admission (Discharged) from 01/22/2019 in [place]  Total Score (max 30 points )  30    PHQ2-9     Office Visit from 03/13/2019 in [facility] Office Visit from 02/20/2019 in [facility] Office Visit from 02/10/2019 in [facility] Patient Outreach Telephone from 12/12/2017 in [place] from 08/22/2016 in [facility]  PHQ-2 Total Score  _0 0  2  PHQ-9 Total Score  _1 --  4       Assessment and Plan: Talor is a 59 year old Caucasian male, married, on  disability, has a history of multiple medical problems, MDD, ADHD was evaluated by telemedicine today.  Patient is biologically predisposed given his multiple health issues.  He also has psychosocial stressors including his physical limitation, chronic mental health problems and the current pandemic.  Patient with recent inpatient mental health admission is currently undergoing [place].  Patient is currently making progress however continues to struggle with sleep issues.  He will benefit from medication readjustment as well as psychotherapy session referral.  Patient has not been able to find a therapist or now.  Plan as noted below.  Plan MDD-in partial remission Celexa 40 mg p.o. daily Increase Seroquel to 100 mg p.o. nightly to help with sleep.  Advised patient to take his sleep medications including Seroquel and trazodone earlier to see if that will help him to fall asleep sooner. Seroquel 25 mg p.o. twice daily as needed for anxiety attacks-reduced dosage Trazodone 200 mg p.o. nightly as needed for sleep Wellbutrin XL 150 mg p.o. daily  GAD-improving Celexa as prescribed We will make a referral to our coordinator-Ms. Brown to schedule patient for psychotherapy sessions since he has not been able to find a therapist till now.  Patient advised to continue to work with his providers and monitor his thyroid panel closely.  He is no longer on Cytomel.  Patient will continue TMS-reports he has 2 more sessions to complete TMS therapy.  Follow-up in clinic in 3 to 4 weeks or sooner if needed.  February 3 at 3 PM  I have spent atleast 20 minutes non  face to face with patient today. More than 50 % of the time was spent for psychoeducation and supportive psychotherapy and care coordination. This note was generated in part or whole with voice recognition software. Voice recognition is usually quite accurate but there are transcription errors that can and very often do occur. I apologize for any  typographical errors that were not detected and corrected.      Ursula Alert, MD 05/13/2019, 12:45 PM

## 2019-05-12 NOTE — Progress Notes (Signed)
Patient reported to [facility] for Repetitive Transcranial Magnetic Stimulation treatment for severe episode of recurrent major depressive disorder, without psychotic features. Patient presented with appropriate affect, level mood and denied any suicidal or homicidal ideations. Patient denies any other current symptoms and remains optimistic with continued [place] treatment. Patient reported no change in alcohol/substance use, caffeine consumption, sleep pattern or metal implant status since previous tx. Patient reported to tx session with pleasant affect.Pt shared that he has been in a great mood the past few days. He is excited about his new job. Powerwas titrated to120% for the duration of txand will remain there until patient gets adjusted. Patient reported no complaints or discomfort. Patientdeparted post-treatment with no concerns or complaints.

## 2019-05-13 ENCOUNTER — Ambulatory Visit (INDEPENDENT_AMBULATORY_CARE_PROVIDER_SITE_OTHER): Payer: PPO | Admitting: Clinical

## 2019-05-13 ENCOUNTER — Other Ambulatory Visit (INDEPENDENT_AMBULATORY_CARE_PROVIDER_SITE_OTHER): Payer: PPO | Admitting: Emergency Medicine

## 2019-05-13 DIAGNOSIS — F411 Generalized anxiety disorder: Secondary | ICD-10-CM | POA: Diagnosis not present

## 2019-05-13 DIAGNOSIS — F3341 Major depressive disorder, recurrent, in partial remission: Secondary | ICD-10-CM | POA: Diagnosis not present

## 2019-05-13 DIAGNOSIS — F332 Major depressive disorder, recurrent severe without psychotic features: Secondary | ICD-10-CM | POA: Diagnosis not present

## 2019-05-13 NOTE — Progress Notes (Signed)
Patient reported to [facility] for Repetitive Transcranial Magnetic Stimulation treatment for severe episode of recurrent major depressive disorder, without psychotic features. Patient presented with appropriate affect, level mood and denied any suicidal or homicidal ideations. Patient denies any other current symptoms and remains optimistic with continued [place] treatment. Patient reported no change in alcohol/substance use, caffeine consumption, sleep pattern or metal implant status since previous tx. Patient reported to tx session with pleasant affect.Pt shared that he has been in a great mood the past few days. He is excited about his new job.Powerwas titrated to120% for the duration of txand will remain there until patient gets adjusted. Patient reported no complaints or discomfort. Patientdeparted post-treatment with no concerns or complaints.
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
## 2019-05-19 ENCOUNTER — Encounter (HOSPITAL_COMMUNITY): Payer: PPO | Admitting: Emergency Medicine

## 2019-05-20 ENCOUNTER — Other Ambulatory Visit: Payer: Self-pay

## 2019-05-20 ENCOUNTER — Other Ambulatory Visit (HOSPITAL_COMMUNITY): Payer: PPO | Admitting: Emergency Medicine

## 2019-05-20 DIAGNOSIS — R809 Proteinuria, unspecified: Secondary | ICD-10-CM | POA: Diagnosis not present

## 2019-05-20 DIAGNOSIS — F332 Major depressive disorder, recurrent severe without psychotic features: Secondary | ICD-10-CM

## 2019-05-20 DIAGNOSIS — E1029 Type 1 diabetes mellitus with other diabetic kidney complication: Secondary | ICD-10-CM | POA: Diagnosis not present

## 2019-05-20 NOTE — Progress Notes (Signed)
Patient reported to [facility] for Repetitive Transcranial Magnetic Stimulation treatment for severe episode of recurrent major depressive disorder, without psychotic features. Patient presented with appropriate affect, level mood and denied any suicidal or homicidal ideations. Patient denies any other current symptoms and remains optimistic with continued [place] treatment. Patient reported no change in alcohol/substance use, caffeine consumption, sleep pattern or metal implant status since previous tx. Patient reported to tx session with pleasant affect.This is final session for [place]. [facility] has tremendously helped this patient. He stated, "I feel like a new person!"Powerwas titrated to120% for the duration of txand will remain there until patient gets adjusted. Patient reported no complaints or discomfort. Patientdeparted post-treatment with no concerns or complaints.

## 2019-05-21 DIAGNOSIS — J4530 Mild persistent asthma, uncomplicated: Secondary | ICD-10-CM | POA: Diagnosis not present

## 2019-05-22 DIAGNOSIS — E1029 Type 1 diabetes mellitus with other diabetic kidney complication: Secondary | ICD-10-CM | POA: Diagnosis not present

## 2019-05-22 DIAGNOSIS — R809 Proteinuria, unspecified: Secondary | ICD-10-CM | POA: Diagnosis not present

## 2019-05-29 ENCOUNTER — Ambulatory Visit (HOSPITAL_COMMUNITY): Payer: PPO | Admitting: Clinical

## 2019-06-04 DIAGNOSIS — E1029 Type 1 diabetes mellitus with other diabetic kidney complication: Secondary | ICD-10-CM | POA: Diagnosis not present

## 2019-06-04 DIAGNOSIS — R809 Proteinuria, unspecified: Secondary | ICD-10-CM | POA: Diagnosis not present

## 2019-06-04 DIAGNOSIS — E785 Hyperlipidemia, unspecified: Secondary | ICD-10-CM | POA: Diagnosis not present

## 2019-06-04 DIAGNOSIS — E1069 Type 1 diabetes mellitus with other specified complication: Secondary | ICD-10-CM | POA: Diagnosis not present

## 2019-06-04 DIAGNOSIS — I10 Essential (primary) hypertension: Secondary | ICD-10-CM | POA: Diagnosis not present

## 2019-06-04 DIAGNOSIS — E1159 Type 2 diabetes mellitus with other circulatory complications: Secondary | ICD-10-CM | POA: Diagnosis not present

## 2019-06-05 ENCOUNTER — Other Ambulatory Visit: Payer: Self-pay

## 2019-06-05 ENCOUNTER — Ambulatory Visit (INDEPENDENT_AMBULATORY_CARE_PROVIDER_SITE_OTHER): Payer: PPO | Admitting: Clinical

## 2019-06-05 DIAGNOSIS — F411 Generalized anxiety disorder: Secondary | ICD-10-CM | POA: Diagnosis not present

## 2019-06-05 DIAGNOSIS — F332 Major depressive disorder, recurrent severe without psychotic features: Secondary | ICD-10-CM

## 2019-06-05 DIAGNOSIS — Z430 Encounter for attention to tracheostomy: Secondary | ICD-10-CM | POA: Diagnosis not present

## 2019-06-05 NOTE — Progress Notes (Signed)
Virtual Visit via Video Note  I connected with [name] on 06/05/19 at  1:00 PM EST by a video enabled telemedicine application and verified that I am speaking with the correct person using two identifiers.  Location: Patient: Home Provider: Office   I discussed the limitations of evaluation and management by telemedicine and the availability of in person appointments. The patient expressed understanding and agreed to proceed.          THERAPIST PROGRESS NOTE  Session Time: 1:00PM-1:30PM  Participation Level: Active  Behavioral Response: CasualAlertDepressed  Type of Therapy: Individual Therapy  Treatment Goals addressed: Coping  Interventions: CBT  Summary: [name] is a 59 y.o. male who presents with Major Depression and Generalized Anxiety. The OPT therapist worked with the client for his initial session. The OPT therapist utilized Motivational Interviewing to assist in creating therapeutic repore. The patient in the session was engaged and work in collaboration giving feedback about his triggers and symptoms over the past few weeks indicating conflict with his wife and inconsistency with sleep as primary triggers.. The OPT therapist utilized Cognitive Behavioral Therapy through cognitive restructuring as well as worked with the patient on coping strategies to assist in management of Anxiety and Depression. The OPT therapist inquired for holistic care about the patients adherence to medication therapy.  Suicidal/Homicidal: Nowithout intent/plan  Therapist Response: The OPT therapist worked with the patient for the patients initial scheduled session. The patient was engaged in his session and gave feedback in relation to triggers, symptoms, and behavior responses over the past 2 weeks. The OPT therapist worked with the patient utilizing an in session Cognitive Behavioral Therapy exercise. The patient was responsive in the session and verbalized, " I am overall  doing better, I have plans to go to work for [employer] part time". The patient indicated both compliance and effectiveness in relation to his current medication therapy. The OPT therapist will continue treatment work with the patient in his next scheduled session.  Plan: Return again in 2 weeks.  Diagnosis: Axis I: Generalized Anxiety Disorder and Major Depression, Recurrent severe    Axis II: No diagnosis  I discussed the assessment and treatment plan with the patient. The patient was provided an opportunity to ask questions and all were answered. The patient agreed with the plan and demonstrated an understanding of the instructions.   The patient was advised to call back or seek an in-person evaluation if the symptoms worsen or if the condition fails to improve as anticipated.  I provided 30 minutes of non-face-to-face time during this encounter.  Lennox Grumbles, LCSW 06/05/2019

## 2019-06-09 DIAGNOSIS — J988 Other specified respiratory disorders: Secondary | ICD-10-CM | POA: Diagnosis not present

## 2019-06-09 DIAGNOSIS — E1029 Type 1 diabetes mellitus with other diabetic kidney complication: Secondary | ICD-10-CM | POA: Diagnosis not present

## 2019-06-09 DIAGNOSIS — B965 Pseudomonas (aeruginosa) (mallei) (pseudomallei) as the cause of diseases classified elsewhere: Secondary | ICD-10-CM | POA: Diagnosis not present

## 2019-06-09 DIAGNOSIS — R809 Proteinuria, unspecified: Secondary | ICD-10-CM | POA: Diagnosis not present

## 2019-06-11 ENCOUNTER — Encounter: Payer: Self-pay | Admitting: Psychiatry

## 2019-06-11 ENCOUNTER — Ambulatory Visit (INDEPENDENT_AMBULATORY_CARE_PROVIDER_SITE_OTHER): Payer: PPO | Admitting: Psychiatry

## 2019-06-11 ENCOUNTER — Other Ambulatory Visit: Payer: Self-pay

## 2019-06-11 DIAGNOSIS — F411 Generalized anxiety disorder: Secondary | ICD-10-CM

## 2019-06-11 DIAGNOSIS — F3342 Major depressive disorder, recurrent, in full remission: Secondary | ICD-10-CM | POA: Diagnosis not present

## 2019-06-11 MED ORDER — CITALOPRAM HYDROBROMIDE 40 MG PO TABS: 40.0000 mg | ORAL_TABLET | Freq: Every day | ORAL | 0 refills | Status: DC

## 2019-06-11 MED ORDER — QUETIAPINE FUMARATE 25 MG PO TABS: 25.0000 mg | ORAL_TABLET | Freq: Two times a day (BID) | ORAL | 1 refills | Status: DC | PRN

## 2019-06-11 MED ORDER — BUPROPION HCL ER (XL) 150 MG PO TB24: 150.0000 mg | ORAL_TABLET | Freq: Every day | ORAL | 0 refills | Status: DC

## 2019-06-11 NOTE — Progress Notes (Addendum)
Provider Location : [facility] Patient Location : Home   Virtual Visit via Video Note  I connected with [name] on 06/11/19 at  3:00 PM EST by a video enabled telemedicine application and verified that I am speaking with the correct person using two identifiers.   I discussed the limitations of evaluation and management by telemedicine and the availability of in person appointments. The patient expressed understanding and agreed to proceed.    I discussed the assessment and treatment plan with the patient. The patient was provided an opportunity to ask questions and all were answered. The patient agreed with the plan and demonstrated an understanding of the instructions.   The patient was advised to call back or seek an in-person evaluation if the symptoms worsen or if the condition fails to improve as anticipated.   [facility] MD OP Progress Note  06/11/2019 3:17 PM [name]  MRN:  [id number]  Chief Complaint:  Chief Complaint    Follow-up     HPI: [name] is a 59 year old Caucasian male, married, lives in [place], has a history of depression, GAD, multiple medical problems including history of tracheostomy, status post multiple intubation all during one hospitalization, post acute hypoxemic respiratory failure due to opioid overdose, history of CVA with right-sided hemiparesis, diabetes, hypertension, NSTEMI, hypothyroidism, hyperlipidemia and chronic pain was evaluated by telemedicine today.  Patient today appeared to be pleasant and cheerful.  Patient reports his depressive symptoms have improved a lot.  He reports he feels motivated, denies any anhedonia, denies any sadness and reports he is able to do more things around the house.  He reports he has been keeping himself busy doing chores and running errands.  He reports sleep is good.  Patient reports the [place] definitely helped him to get better.  He continues to be compliant on his medications and denies any side  effects.  Patient denies any suicidality, homicidality or perceptual disturbances.  He has started psychotherapy sessions and reports therapy sessions is beneficial.  Patient denies any other concerns today. Visit Diagnosis:    ICD-10-CM   1. MDD (major depressive disorder), recurrent, in full remission ([place])  F33.42 buPROPion (WELLBUTRIN XL) 150 MG 24 hr tablet    citalopram (CELEXA) 40 MG tablet    QUEtiapine (SEROQUEL) 25 MG tablet  2. GAD (generalized anxiety disorder)  F41.1 buPROPion (WELLBUTRIN XL) 150 MG 24 hr tablet    citalopram (CELEXA) 40 MG tablet    QUEtiapine (SEROQUEL) 25 MG tablet    Past Psychiatric History: I have reviewed past psychiatric history from my progress note on 01/15/2019.  Past trials of fluvoxamine, Rexulti, Xanax, Abilify.  Patient with multiple inpatient mental health admissions.  Most recently he had back-to-back inpatient mental health admission in August 2020 and recently on September 17.  Patient did not tolerate ECT.  He did complete [facility] therapy recently (05/20/2019).  Past Medical History:  Past Medical History:  Diagnosis Date  . Depression   . Diabetes (HCC)    Insulin Pump  . Diabetes mellitus type I ([place])   . Diabetes mellitus without complication ([place])   . GERD (gastroesophageal reflux disease)   . H/O laryngectomy   . Heel bone fracture   . Hyperlipidemia   . Hypertension   . Radicular pain of right lower extremity   . Stroke ([place])   . Suicide attempt [facility]) 2014   damaged larynx - tracheostomy  . Thyroid disease     Past Surgical History:  Procedure Laterality Date  .  COLONOSCOPY WITH PROPOFOL N/A 05/15/2018   Procedure: COLONOSCOPY WITH PROPOFOL;  Surgeon: [name], [name], MD;  Location: [facility] ENDOSCOPY;  Service: Gastroenterology;  Laterality: N/A;  . ESOPHAGOGASTRODUODENOSCOPY N/A 05/15/2018   Procedure: ESOPHAGOGASTRODUODENOSCOPY (EGD);  Surgeon: [name], [name], MD;  Location: [facility] ENDOSCOPY;  Service: Gastroenterology;   Laterality: N/A;  . FRACTURE SURGERY    . Heel bone reconstruction Left   . HERNIA REPAIR  [id number]   Umbilical hernia repair   . LARYNGECTOMY    . NECK SURGERY     fusion  . SPINE SURGERY    . TRACHEOSTOMY  2014   from [place] attempt    Family Psychiatric History: I have reviewed family psychiatric history from my progress note on 01/15/2019.  Family History:  Family History  Problem Relation Age of Onset  . Osteoporosis Mother   . Diabetes Mother   . Hypertension Father   . Mental illness Neg Hx     Social History: Reviewed social history from my progress note on 01/15/2019. Social History   Socioeconomic History  . Marital status: Married    Spouse name: Not on file  . Number of children: Not on file  . Years of education: Not on file  . Highest education level: Not on file  Occupational History  . Not on file  Tobacco Use  . Smoking status: Former Smoker    Packs/day: 0.00    Types: Cigarettes    Quit date: 04/09/2013    Years since quitting: 6.1  . Smokeless tobacco: Never Used  Substance and Sexual Activity  . Alcohol use: Yes    Alcohol/week: 2.0 standard drinks    Types: 2 Shots of liquor per week    Comment: rare  . Drug use: No    Comment: Pt denied; UDS not available  . Sexual activity: Yes    Partners: Female    Birth control/protection: Condom  Other Topics Concern  . Not on file  Social History Narrative  . Not on file   Social Determinants of Health   Financial Resource Strain:   . Difficulty of Paying Living Expenses: Not on file  Food Insecurity:   . Worried About Charity fundraiser in the Last Year: Not on file  . Ran Out of Food in the Last Year: Not on file  Transportation Needs:   . Lack of Transportation (Medical): Not on file  . Lack of Transportation (Non-Medical): Not on file  Physical Activity:   . Days of Exercise per Week: Not on file  . Minutes of Exercise per Session: Not on file  Stress:   . Feeling of Stress : Not on file   Social Connections:   . Frequency of Communication with Friends and Family: Not on file  . Frequency of Social Gatherings with Friends and Family: Not on file  . Attends Religious Services: Not on file  . Active Member of Clubs or Organizations: Not on file  . Attends Archivist Meetings: Not on file  . Marital Status: Not on file    Allergies:  Allergies  Allergen Reactions  . Buspar [Buspirone]     Makes the patient "flip out"  . Depakote [Valproic Acid]     Causes excessive drowsiness  . Clopidogrel Rash  . Gabapentin Itching and Rash    Metabolic Disorder Labs: Lab Results  Component Value Date   HGBA1C 7.8 (H) 01/01/2019   MPG 177.16 01/01/2019   MPG 174.29 12/23/2018   No results found for:  PROLACTIN Lab Results  Component Value Date   CHOL 161 09/15/2016   TRIG 190 (H) 09/15/2016   HDL 41 09/15/2016   CHOLHDL 3.9 09/15/2016   VLDL 38 09/15/2016   LDLCALC 82 09/15/2016   Lab Results  Component Value Date   TSH 1.952 09/15/2016    Therapeutic Level Labs: No results found for: LITHIUM Lab Results  Component Value Date   VALPROATE 19 (L) 09/12/2016   No components found for:  CBMZ  Current Medications: Current Outpatient Medications  Medication Sig Dispense Refill  . glycopyrrolate (ROBINUL) 1 MG tablet Take by mouth.    . rosuvastatin (CRESTOR) 20 MG tablet Take by mouth.    . tobramycin, PF, (TOBI) 300 MG/5ML nebulizer solution Inhale into the lungs.    Marland Kitchen albuterol (ACCUNEB) 1.25 MG/3ML nebulizer solution SMARTSIG:3 Milliliter(s) Via Nebulizer Every 6 Hours PRN    . amLODipine (NORVASC) 5 MG tablet Take 1 tablet (5 mg total) by mouth daily. 30 tablet 1  . aspirin EC 81 MG tablet Take 1 tablet (81 mg total) by mouth daily. 30 tablet 1  . buPROPion (WELLBUTRIN XL) 150 MG 24 hr tablet Take 1 tablet (150 mg total) by mouth daily. 90 tablet 0  . ciprofloxacin (CIPRO) 250 MG tablet Take 250 mg by mouth 2 (two) times daily.    . citalopram  (CELEXA) 40 MG tablet Take 1 tablet (40 mg total) by mouth daily. 90 tablet 0  . Continuous Blood Gluc Receiver (FREESTYLE LIBRE 14 DAY READER) DEVI FreeStyle Libre 14 Day Reader    . Continuous Blood Gluc Sensor (FREESTYLE LIBRE 14 DAY SENSOR) MISC FreeStyle Libre 14 Day Sensor kit    . empagliflozin (JARDIANCE) 25 MG TABS tablet Take 25 mg by mouth daily. 30 tablet 1  . glycopyrrolate (ROBINUL) 1 MG tablet     . hydrochlorothiazide (HYDRODIURIL) 25 MG tablet hydrochlorothiazide 25 mg tablet    . insulin aspart (NOVOLOG) 100 UNIT/ML injection INJECT UP TO 120 UNITS UNDER THE SKIN AS DIRECTED DAILY VIA INSULIN PUMP    . insulin aspart (NOVOLOG) 100 UNIT/ML injection INJECT UP TO 120 UNITS UNDER THE SKIN UTD D VIA INSULIN PUMP    . levofloxacin (LEVAQUIN) 750 MG tablet Take 750 mg by mouth daily.    Marland Kitchen levothyroxine (SYNTHROID) 75 MCG tablet TAKE 1 TABLET BY MOUTH DAILY AT 6 AM 90 tablet 0  . losartan-hydrochlorothiazide (HYZAAR) 100-25 MG tablet TAKE 1 TABLET BY MOUTH EVERY DAY    . pantoprazole (PROTONIX) 40 MG tablet     . pantoprazole (PROTONIX) 40 MG tablet TAKE 1 TABLET(40 MG) BY MOUTH TWICE DAILY    . QUEtiapine (SEROQUEL) 100 MG tablet Take 1 tablet (100 mg total) by mouth at bedtime. Mood and sleep 90 tablet 0  . QUEtiapine (SEROQUEL) 25 MG tablet Take 1 tablet (25 mg total) by mouth 2 (two) times daily as needed (anxiety). Daily as needed for severe anxiety and at bedtime as needed for sleep 60 tablet 1  . rosuvastatin (CRESTOR) 20 MG tablet Take 20 mg by mouth at bedtime.    . simvastatin (ZOCOR) 20 MG tablet TAKE 1 TABLET(20 MG) BY MOUTH EVERY NIGHT    . tobramycin, PF, (TOBI) 300 MG/5ML nebulizer solution     . traMADol (ULTRAM) 50 MG tablet Take 100 mg by mouth every 6 (six) hours as needed.    . traZODone (DESYREL) 100 MG tablet Take 1-2 tablets (100-200 mg total) by mouth at bedtime as needed for sleep. 180 tablet  0   No current facility-administered medications for this visit.      Musculoskeletal: Strength & Muscle Tone: UTA Gait & Station: normal Patient leans: N/A  Psychiatric Specialty Exam: Review of Systems  Psychiatric/Behavioral: Negative for agitation, behavioral problems, confusion, decreased concentration, dysphoric mood, hallucinations, self-injury, sleep disturbance and suicidal ideas. The patient is not nervous/anxious and is not hyperactive.   All other systems reviewed and are negative.   There were no vitals taken for this visit.There is no height or weight on file to calculate BMI.  General Appearance: Casual  Eye Contact:  Fair  Speech;Slow - had tracheostomy  Volume:  Normal  Mood:  Euthymic  Affect:  Congruent  Thought Process:  Goal Directed and Descriptions of Associations: Intact  Orientation:  Full (Time, Place, and Person)  Thought Content: Logical   Suicidal Thoughts:  No  Homicidal Thoughts:  No  Memory:  Immediate;   Fair Recent;   Fair Remote;   Fair  Judgement:  Fair  Insight:  Fair  Psychomotor Activity:  Normal  Concentration:  Concentration: Fair and Attention Span: Fair  Recall:  [employer] of Knowledge: Fair  Language: Fair  Akathisia:  No  Handed:  Right  AIMS (if indicated): Denies tremors, rigidity  Assets:  Communication Skills Desire for [place] Talents/Skills  ADL's:  Intact  Cognition: WNL  Sleep:  Fair   Screenings: AUDIT     Admission (Discharged) from 01/22/2019 in [facility] Admission (Discharged) from 12/31/2018 in [facility] Admission (Discharged) from 09/14/2016 in [facility]  Alcohol Use Disorder Identification Test Final Score (AUDIT)  0  0  1    ECT-MADRS     Admission (Discharged) from 01/22/2019 in [facility] Total Score  27    Mini-Mental     Admission (Discharged) from 01/22/2019 in [facility]  Total Score (max 30  points )  30    PHQ2-9     Office Visit from 06/11/2019 in [facility] Visit from 03/13/2019 in [facility] Visit from 02/20/2019 in [facility] Office Visit from 02/10/2019 in [facility] Patient Outreach Telephone from 12/12/2017 in [facility]  PHQ-2 Total Score  '1  4  6  3  ' 0  PHQ-9 Total Score  --  '16  21  13  ' --       Assessment and Plan: Abu is a 59 year old Caucasian male, married, on disability, has a history of multiple medical problems, MDD, ADHD was evaluated by telemedicine today.  Patient is biologically predisposed given his multiple health issues.  He also has psychosocial stressors including physical limitation, chronic mental health problems and current pandemic.  Patient completed [facility] therapy recently and is currently reporting his depressive symptoms as in remission.  Patient will continue to benefit from psychotherapy sessions.  Plan as noted below.  Plan MDD full remission Celexa 40 mg p.o. daily Seroquel 100 mg p.o. nightly . Seroquel 25 mg p.o. twice daily as needed for anxiety attacks-reduced dosage. Trazodone 200 mg p.o. nightly as needed for sleep. Wellbutrin XL 150 mg p.o. daily PHQ 9 - 1   GAD-stable Celexa as prescribed  Patient to continue to follow-up with his therapist-Mr. [name].  Patient to follow up with PMD for thyroid abnormalities.  Follow-up in clinic in 6 weeks or sooner if needed.  March 24 at 2  PM  I have spent atleast 20 minutes non face to face with patient today. More than 50 % of the time was spent for  ordering medications and test ,psychoeducation and supportive psychotherapy and care coordination,as well as documenting clinical information in electronic health record. This note was generated in part or whole with voice recognition software. Voice recognition is usually quite  accurate but there are transcription errors that can and very often do occur. I apologize for any typographical errors that were not detected and corrected.          Ursula Alert, MD 06/11/2019, 3:17 PM

## 2019-06-20 DIAGNOSIS — E1029 Type 1 diabetes mellitus with other diabetic kidney complication: Secondary | ICD-10-CM | POA: Diagnosis not present

## 2019-06-20 DIAGNOSIS — R809 Proteinuria, unspecified: Secondary | ICD-10-CM | POA: Diagnosis not present

## 2019-06-21 DIAGNOSIS — J4530 Mild persistent asthma, uncomplicated: Secondary | ICD-10-CM | POA: Diagnosis not present

## 2019-06-26 ENCOUNTER — Ambulatory Visit (HOSPITAL_COMMUNITY): Payer: PPO | Admitting: Clinical

## 2019-07-04 ENCOUNTER — Other Ambulatory Visit: Payer: Self-pay | Admitting: Psychiatry

## 2019-07-09 DIAGNOSIS — N183 Chronic kidney disease, stage 3 unspecified: Secondary | ICD-10-CM | POA: Diagnosis not present

## 2019-07-09 DIAGNOSIS — G8191 Hemiplegia, unspecified affecting right dominant side: Secondary | ICD-10-CM | POA: Diagnosis not present

## 2019-07-09 DIAGNOSIS — E1165 Type 2 diabetes mellitus with hyperglycemia: Secondary | ICD-10-CM | POA: Diagnosis not present

## 2019-07-09 DIAGNOSIS — E1122 Type 2 diabetes mellitus with diabetic chronic kidney disease: Secondary | ICD-10-CM | POA: Diagnosis not present

## 2019-07-09 DIAGNOSIS — Z794 Long term (current) use of insulin: Secondary | ICD-10-CM | POA: Diagnosis not present

## 2019-07-09 DIAGNOSIS — J449 Chronic obstructive pulmonary disease, unspecified: Secondary | ICD-10-CM | POA: Diagnosis not present

## 2019-07-09 DIAGNOSIS — F339 Major depressive disorder, recurrent, unspecified: Secondary | ICD-10-CM | POA: Diagnosis not present

## 2019-07-09 DIAGNOSIS — Z930 Tracheostomy status: Secondary | ICD-10-CM | POA: Diagnosis not present

## 2019-07-18 DIAGNOSIS — E1029 Type 1 diabetes mellitus with other diabetic kidney complication: Secondary | ICD-10-CM | POA: Diagnosis not present

## 2019-07-18 DIAGNOSIS — R809 Proteinuria, unspecified: Secondary | ICD-10-CM | POA: Diagnosis not present

## 2019-07-19 DIAGNOSIS — J4530 Mild persistent asthma, uncomplicated: Secondary | ICD-10-CM | POA: Diagnosis not present

## 2019-07-21 ENCOUNTER — Ambulatory Visit (HOSPITAL_COMMUNITY): Payer: PPO | Admitting: Clinical

## 2019-07-21 ENCOUNTER — Other Ambulatory Visit: Payer: Self-pay

## 2019-07-30 ENCOUNTER — Encounter: Payer: Self-pay | Admitting: Psychiatry

## 2019-07-30 ENCOUNTER — Ambulatory Visit (INDEPENDENT_AMBULATORY_CARE_PROVIDER_SITE_OTHER): Payer: PPO | Admitting: Psychiatry

## 2019-07-30 ENCOUNTER — Other Ambulatory Visit: Payer: Self-pay

## 2019-07-30 DIAGNOSIS — F411 Generalized anxiety disorder: Secondary | ICD-10-CM

## 2019-07-30 DIAGNOSIS — F99 Mental disorder, not otherwise specified: Secondary | ICD-10-CM

## 2019-07-30 DIAGNOSIS — F5105 Insomnia due to other mental disorder: Secondary | ICD-10-CM | POA: Diagnosis not present

## 2019-07-30 DIAGNOSIS — F331 Major depressive disorder, recurrent, moderate: Secondary | ICD-10-CM

## 2019-07-30 MED ORDER — OLANZAPINE 5 MG PO TABS: 5.0000 mg | ORAL_TABLET | Freq: Every day | ORAL | 0 refills | Status: DC

## 2019-07-30 MED ORDER — TRAZODONE HCL 100 MG PO TABS: 50.0000 mg | ORAL_TABLET | Freq: Every evening | ORAL | 0 refills | Status: DC | PRN

## 2019-07-30 NOTE — Progress Notes (Signed)
Provider Location : [facility] Patient Location : Home  Virtual Visit via Video Note  I connected with [name] on 07/30/19 at  2:00 PM EDT by a video enabled telemedicine application and verified that I am speaking with the correct person using two identifiers.   I discussed the limitations of evaluation and management by telemedicine and the availability of in person appointments. The patient expressed understanding and agreed to proceed.     I discussed the assessment and treatment plan with the patient. The patient was provided an opportunity to ask questions and all were answered. The patient agreed with the plan and demonstrated an understanding of the instructions.   The patient was advised to call back or seek an in-person evaluation if the symptoms worsen or if the condition fails to improve as anticipated.   [place] MD OP Progress Note  07/30/2019 2:52 PM [name]  MRN:  [id number]  Chief Complaint:  Chief Complaint    Follow-up     HPI: [name] is a 59 year old Caucasian male, married, lives in [place], has a history of depression, anxiety, multiple medical problems, history of tracheal stenosis status post multiple reintubations all during one hospitalization post acute hypoxemic respiratory failure due to opioid overdose, history of CVA with right-sided hemiparesis currently resolved, diabetes, hypertension, NSTEMI, hypothyroidism, hyperlipidemia, chronic pain was evaluated by telemedicine today.   Patient today reports he is currently struggling with psychosocial stressor of his wife's hospitalization.  She got hospitalized 2 days ago.  Patient reports he is worried about her.  He however reports since the past 2 weeks he has been having sleep issues.  He reports once he takes the Seroquel and the trazodone it takes him several hours to get to sleep.  He takes the medication around bedtime and falls asleep around 5 AM in the morning.  Patient reports he has  been sleeping during the day and is staying up at night since the past 2 weeks.  This kind of frustrates him and he reports he does not want to do this anymore.  Patient reports he has tried a higher dosage of trazodone but that also does not seem to be effective.  He denies being depressed.  He does report reduced appetite however when asked to elaborate he reported that he may be doing that intentionally since he does not want to gain a lot of weight.  Patient denies any suicidality, homicidality or perceptual disturbances.  Patient is currently in psychotherapy sessions with [name] and reports he has upcoming appointment scheduled.  He is motivated to keep it.  Patient denies any other concerns today.  Visit Diagnosis:    ICD-10-CM   1. MDD (major depressive disorder), recurrent episode, moderate (HCC)  F33.1 OLANZapine (ZYPREXA) 5 MG tablet  2. GAD (generalized anxiety disorder)  F41.1 traZODone (DESYREL) 100 MG tablet  3. Insomnia due to other mental disorder  F51.05    F99     Past Psychiatric History: I have reviewed past psychiatric history from my progress note on 01/15/2019.  Past trials of fluvoxamine, Rexulti, Xanax, Abilify.  Patient with multiple inpatient mental health admissions.  Most recently he had back-to-back inpatient mental health admission in August 2020 and September 2020.  Patient did not tolerate ECT.  He completed [place] therapy on 05/20/2019.  Past Medical History:  Past Medical History:  Diagnosis Date  . Depression   . Diabetes (HCC)    Insulin Pump  . Diabetes mellitus type I ([place])   .  Diabetes mellitus without complication ([place])   . GERD (gastroesophageal reflux disease)   . H/O laryngectomy   . Heel bone fracture   . Hyperlipidemia   . Hypertension   . Radicular pain of right lower extremity   . Stroke ([place])   . Suicide attempt [facility]) 2014   damaged larynx - tracheostomy  . Thyroid disease     Past Surgical History:  Procedure Laterality  Date  . COLONOSCOPY WITH PROPOFOL N/A 05/15/2018   Procedure: COLONOSCOPY WITH PROPOFOL;  Surgeon: [name], [name], MD;  Location: [facility] ENDOSCOPY;  Service: Gastroenterology;  Laterality: N/A;  . ESOPHAGOGASTRODUODENOSCOPY N/A 05/15/2018   Procedure: ESOPHAGOGASTRODUODENOSCOPY (EGD);  Surgeon: [name], [name], MD;  Location: [facility] ENDOSCOPY;  Service: Gastroenterology;  Laterality: N/A;  . FRACTURE SURGERY    . Heel bone reconstruction Left   . HERNIA REPAIR  [id number]   Umbilical hernia repair   . LARYNGECTOMY    . NECK SURGERY     fusion  . SPINE SURGERY    . TRACHEOSTOMY  2014   from [place] attempt    Family Psychiatric History: I have reviewed family psychiatric history from my progress note on 01/15/2019.  Family History:  Family History  Problem Relation Holder of Onset  . Osteoporosis Mother   . Diabetes Mother   . Hypertension Father   . Mental illness Neg Hx     Social History: I have reviewed social history from my progress note on 01/15/2019. Social History   Socioeconomic History  . Marital status: Married    Spouse name: Not on file  . Number of children: Not on file  . Years of education: Not on file  . Highest education level: Not on file  Occupational History  . Not on file  Tobacco Use  . Smoking status: Former Smoker    Packs/day: 0.00    Types: Cigarettes    Quit date: 04/09/2013    Years since quitting: 6.3  . Smokeless tobacco: Never Used  Substance and Sexual Activity  . Alcohol use: Yes    Alcohol/week: 2.0 standard drinks    Types: 2 Shots of liquor per week    Comment: rare  . Drug use: No    Comment: Pt denied; UDS not available  . Sexual activity: Yes    Partners: Female    Birth control/protection: Condom  Other Topics Concern  . Not on file  Social History Narrative  . Not on file   Social Determinants of Health   Financial Resource Strain:   . Difficulty of Paying Living Expenses:   Food Insecurity:   . Worried About Charity fundraiser  in the Last Year:   . Arboriculturist in the Last Year:   Transportation Needs:   . Film/video editor (Medical):   Marland Kitchen Lack of Transportation (Non-Medical):   Physical Activity:   . Days of Exercise per Week:   . Minutes of Exercise per Session:   Stress:   . Feeling of Stress :   Social Connections:   . Frequency of Communication with Friends and Family:   . Frequency of Social Gatherings with Friends and Family:   . Attends Religious Services:   . Active Member of Clubs or Organizations:   . Attends Archivist Meetings:   Marland Kitchen Marital Status:     Allergies:  Allergies  Allergen Reactions  . Buspar [Buspirone]     Makes the patient "flip out"  . Depakote [Valproic Acid]  Causes excessive drowsiness  . Clopidogrel Rash  . Gabapentin Itching and Rash    Metabolic Disorder Labs: Lab Results  Component Value Date   HGBA1C 7.8 (H) 01/01/2019   MPG 177.16 01/01/2019   MPG 174.29 12/23/2018   No results found for: PROLACTIN Lab Results  Component Value Date   CHOL 161 09/15/2016   TRIG 190 (H) 09/15/2016   HDL 41 09/15/2016   CHOLHDL 3.9 09/15/2016   VLDL 38 09/15/2016   LDLCALC 82 09/15/2016   Lab Results  Component Value Date   TSH 1.952 09/15/2016    Therapeutic Level Labs: No results found for: LITHIUM Lab Results  Component Value Date   VALPROATE 19 (L) 09/12/2016   No components found for:  CBMZ  Current Medications: Current Outpatient Medications  Medication Sig Dispense Refill  . albuterol (ACCUNEB) 1.25 MG/3ML nebulizer solution SMARTSIG:3 Milliliter(s) Via Nebulizer Every 6 Hours PRN    . amLODipine (NORVASC) 5 MG tablet Take 1 tablet (5 mg total) by mouth daily. 30 tablet 1  . aspirin EC 81 MG tablet Take 1 tablet (81 mg total) by mouth daily. 30 tablet 1  . buPROPion (WELLBUTRIN XL) 150 MG 24 hr tablet Take 1 tablet (150 mg total) by mouth daily. 90 tablet 0  . ciprofloxacin (CIPRO) 250 MG tablet Take 250 mg by mouth 2 (two) times  daily.    . citalopram (CELEXA) 40 MG tablet Take 1 tablet (40 mg total) by mouth daily. 90 tablet 0  . Continuous Blood Gluc Receiver (FREESTYLE LIBRE 14 DAY READER) DEVI FreeStyle Libre 14 Day Reader    . Continuous Blood Gluc Sensor (FREESTYLE LIBRE 14 DAY SENSOR) MISC FreeStyle Libre 14 Day Sensor kit    . empagliflozin (JARDIANCE) 25 MG TABS tablet Take 25 mg by mouth daily. 30 tablet 1  . glycopyrrolate (ROBINUL) 1 MG tablet Take by mouth.    Marland Kitchen glycopyrrolate (ROBINUL) 1 MG tablet     . hydrochlorothiazide (HYDRODIURIL) 25 MG tablet hydrochlorothiazide 25 mg tablet    . insulin aspart (NOVOLOG) 100 UNIT/ML injection INJECT UP TO 120 UNITS UNDER THE SKIN AS DIRECTED DAILY VIA INSULIN PUMP    . insulin aspart (NOVOLOG) 100 UNIT/ML injection INJECT UP TO 120 UNITS UNDER THE SKIN UTD D VIA INSULIN PUMP    . levofloxacin (LEVAQUIN) 750 MG tablet Take 750 mg by mouth daily.    Marland Kitchen levothyroxine (SYNTHROID) 75 MCG tablet TAKE 1 TABLET BY MOUTH DAILY AT 6 AM 90 tablet 0  . losartan-hydrochlorothiazide (HYZAAR) 100-25 MG tablet TAKE 1 TABLET BY MOUTH EVERY DAY    . OLANZapine (ZYPREXA) 5 MG tablet Take 1 tablet (5 mg total) by mouth at bedtime. 90 tablet 0  . pantoprazole (PROTONIX) 40 MG tablet     . pantoprazole (PROTONIX) 40 MG tablet TAKE 1 TABLET(40 MG) BY MOUTH TWICE DAILY    . rosuvastatin (CRESTOR) 20 MG tablet Take 20 mg by mouth at bedtime.    . rosuvastatin (CRESTOR) 20 MG tablet Take by mouth.    . simvastatin (ZOCOR) 20 MG tablet TAKE 1 TABLET(20 MG) BY MOUTH EVERY NIGHT    . tobramycin, PF, (TOBI) 300 MG/5ML nebulizer solution     . traMADol (ULTRAM) 50 MG tablet Take 100 mg by mouth every 6 (six) hours as needed.    . traZODone (DESYREL) 100 MG tablet Take 0.5-1 tablets (50-100 mg total) by mouth at bedtime as needed for sleep. 180 tablet 0   No current facility-administered medications for this  visit.     Musculoskeletal: Strength & Muscle Tone: UTA Gait & Station:  normal Patient leans: N/A  Psychiatric Specialty Exam: Review of Systems  Psychiatric/Behavioral: Positive for sleep disturbance.  All other systems reviewed and are negative.   There were no vitals taken for this visit.There is no height or weight on file to calculate BMI.  General Appearance: Casual  Eye Contact:  Fair  Speech:  Slow history of tracheostomy  Volume:  Normal  Mood:  Euthymic  Affect:  Congruent  Thought Process:  Goal Directed and Descriptions of Associations: Intact  Orientation:  Full (Time, Place, and Person)  Thought Content: Logical   Suicidal Thoughts:  No  Homicidal Thoughts:  No  Memory:  Immediate;   Fair Recent;   Fair Remote;   Fair  Judgement:  Fair  Insight:  Fair  Psychomotor Activity:  Normal  Concentration:  Concentration: Fair and Attention Span: Fair  Recall:  [employer] of Knowledge: Fair  Language: Fair  Akathisia:  No  Handed:  Right  AIMS (if indicated): UTA  Assets:  Communication Skills Desire for Improvement Housing Social Support  ADL's:  Intact  Cognition: WNL  Sleep:  Poor   Screenings: AUDIT     Admission (Discharged) from 01/22/2019 in [facility] Admission (Discharged) from 12/31/2018 in [facility] Admission (Discharged) from 09/14/2016 in [facility]  Alcohol Use Disorder Identification Test Final Score (AUDIT)  0  0  1    ECT-MADRS     Admission (Discharged) from 01/22/2019 in [facility] Total Score  27    Mini-Mental     Admission (Discharged) from 01/22/2019 in [facility]  Total Score (max 30 points )  30    PHQ2-9     Office Visit from 06/11/2019 in [facility] Office Visit from 03/13/2019 in [facility] Visit from 02/20/2019 in [facility] Office Visit from 02/10/2019 in [facility] Patient Outreach Telephone from 12/12/2017 in [facility]  PHQ-2 Total Score  '1  4  6  3  ' 0  PHQ-9 Total Score  -  '16  21  13  ' -       Assessment and Plan: [name] is a 59 year old Caucasian male, married, on disability, has a history of multiple medical problems, MDD, ADHD was evaluated by telemedicine today.  Patient is biologically predisposed given his multiple health issues.  He also has current psychosocial stressors of his wife's health issues, she is currently hospitalized, chronic mental health problems, physical limitations and the pandemic.  Patient completed [place] therapy recently and was doing better however today returns reporting sleep problems.  Patient will continue to benefit from medication readjustment and psychotherapy sessions.  Plan MDD-unstable Celexa 40 mg p.o. daily Discontinue Seroquel. Reduce trazodone to 50 to 100 mg p.o. nightly as needed Start Zyprexa 5 mg p.o. nightly Wellbutrin XL 150 mg p.o. daily  GAD-unstable It is likely patient is worried about his wife who is currently hospitalized. Celexa as prescribed Patient advised to start psychotherapy sessions on a more regular basis. Zyprexa as prescribed  Insomnia-unspecified Discontinue Seroquel. Start Zyprexa 5 mg p.o. nightly Trazodone 50 to 100 mg p.o. nightly as needed  I have attempted to contact his mother-Ms. [name] at [phone] voice message. Patient's mother returned  writer's call.  Writer explained patient's mental status.  She has also observed him to  be sleeping during the day.  She reports he was doing much better after the [place].  She will definitely get in touch with him and get him the help that he needs if he is decompensating.'  We will coordinate care with therapist [name].  I have attempted to contact Ms. [name]-regarding maintenance [place] [facility] therapy for this patient.  Follow-up in clinic in 10 days or sooner if needed.  I have  spent atleast 30 minutes non face to face with patient today. More than 50 % of the time was spent for preparing to see the patient ( e.g., review of test, records ), obtaining and to review and separately obtained history , ordering medications and test ,psychoeducation and supportive psychotherapy and care coordination,as well as documenting clinical information in electronic health record. This note was generated in part or whole with voice recognition software. Voice recognition is usually quite accurate but there are transcription errors that can and very often do occur. I apologize for any typographical errors that were not detected and corrected.       Ursula Alert, MD 07/30/2019, 2:52 PM

## 2019-08-04 ENCOUNTER — Telehealth (HOSPITAL_COMMUNITY): Payer: Self-pay | Admitting: Clinical

## 2019-08-04 ENCOUNTER — Other Ambulatory Visit: Payer: Self-pay

## 2019-08-04 ENCOUNTER — Ambulatory Visit (HOSPITAL_COMMUNITY): Payer: PPO | Admitting: Clinical

## 2019-08-04 NOTE — Telephone Encounter (Signed)
The OPT therapist attempted to contact the patient x3 at number given and left VM. The OPT therapist was not able to make contact with the patient.

## 2019-08-05 ENCOUNTER — Ambulatory Visit (INDEPENDENT_AMBULATORY_CARE_PROVIDER_SITE_OTHER): Payer: PPO | Admitting: Clinical

## 2019-08-05 ENCOUNTER — Encounter: Payer: Self-pay | Admitting: Psychiatry

## 2019-08-05 ENCOUNTER — Other Ambulatory Visit: Payer: Self-pay

## 2019-08-05 ENCOUNTER — Ambulatory Visit (INDEPENDENT_AMBULATORY_CARE_PROVIDER_SITE_OTHER): Payer: PPO | Admitting: Psychiatry

## 2019-08-05 DIAGNOSIS — F5105 Insomnia due to other mental disorder: Secondary | ICD-10-CM

## 2019-08-05 DIAGNOSIS — F411 Generalized anxiety disorder: Secondary | ICD-10-CM | POA: Diagnosis not present

## 2019-08-05 DIAGNOSIS — F332 Major depressive disorder, recurrent severe without psychotic features: Secondary | ICD-10-CM | POA: Diagnosis not present

## 2019-08-05 DIAGNOSIS — F331 Major depressive disorder, recurrent, moderate: Secondary | ICD-10-CM | POA: Diagnosis not present

## 2019-08-05 DIAGNOSIS — F99 Mental disorder, not otherwise specified: Secondary | ICD-10-CM

## 2019-08-05 NOTE — Progress Notes (Signed)
  Virtual Visit via Video Note  I connected with [name] on 08/05/19 at  1:00 PM EDT by a video enabled telemedicine application and verified that I am speaking with the correct person using two identifiers.  Location: Patient: Home Provider: Office   I discussed the limitations of evaluation and management by telemedicine and the availability of in person appointments. The patient expressed understanding and agreed to proceed.          THERAPIST PROGRESS NOTE  Session Time: 1:00PM-1:40PM  Participation Level: Active  Behavioral Response: CasualAlertEuphoric  Type of Therapy: Individual Therapy  Treatment Goals addressed: Coping  Interventions: CBT, Solution Focused, Strength-based and Supportive  Summary: [name] is a 59 y.o. male who presents with  Major Depression and Generalized Anxiety. The OPT therapist worked with the patient for his ongoing OPT treatment. The OPT therapist utilized Motivational Interviewing to assist in creating therapeutic repore. The patient in the session was engaged and work in collaboration giving feedback about his triggers and symptoms over the past few weeks including some physical pain and the stress of his wife being hospitalized. The OPT therapist utilized Cognitive Behavioral Therapy through cognitive restructuring as well as worked with the patient on coping strategies to assist in management of Anxiety and Depression. The OPT therapist inquired for holistic care about the patients adherence to medication therapy. The patient indicated that a recent medication change has significantly helped his mood and sleep giving the patient more energy.  Suicidal/Homicidal: Nowithout intent/plan  Therapist Response: The OPT therapist worked with the patient for the patients  scheduled session. The patient was engaged in his session and gave feedback in relation to triggers, symptoms, and behavior responses over the past few weeks. The  OPT therapist worked with the patient utilizing an in session Cognitive Behavioral Therapy exercise. The patient was responsive in the session and verbalized, " I am feeling a lot better since my medication change about a week and a half ago".  The patient indicates improvement at home in his interactions with his wife. The OPT therapist will continue treatment work with the patient in his next scheduled session.  Plan: Return again in 3 weeks.  Diagnosis: Axis I:  Generalized Anxiety Disorder and Major Depression, Recurrent severe    Axis II: No diagnosis  I discussed the assessment and treatment plan with the patient. The patient was provided an opportunity to ask questions and all were answered. The patient agreed with the plan and demonstrated an understanding of the instructions.   The patient was advised to call back or seek an in-person evaluation if the symptoms worsen or if the condition fails to improve as anticipated.  I provided 40 minutes of non-face-to-face time during this encounter.   Lennox Grumbles, LCSW 08/05/2019

## 2019-08-05 NOTE — Progress Notes (Signed)
Provider Location : [facility] Patient Location : Home  Virtual Visit via Video Note  I connected with [name] on 08/05/19 at 11:45 AM EDT by a video enabled telemedicine application and verified that I am speaking with the correct person using two identifiers.   I discussed the limitations of evaluation and management by telemedicine and the availability of in person appointments. The patient expressed understanding and agreed to proceed.     I discussed the assessment and treatment plan with the patient. The patient was provided an opportunity to ask questions and all were answered. The patient agreed with the plan and demonstrated an understanding of the instructions.   The patient was advised to call back or seek an in-person evaluation if the symptoms worsen or if the condition fails to improve as anticipated.   Williams MD OP Progress Note  08/05/2019 11:58 AM [name]  MRN:  [id number]  Chief Complaint:  Chief Complaint    Follow-up     HPI: [name] is a 59 year old Caucasian male, married, lives in [place], has a history of depression, anxiety, multiple medical problems, history of tracheal stenosis status post multiple reintubation all during one hospitalization post acute hypoxemic respiratory failure due to opiate overdose, history of CVA with right-sided hemiparesis currently resolved, diabetes, hypertension, NSTEMI, hypothyroidism, hyperlipidemia, chronic pain was evaluated by telemedicine today.  Patient was last evaluated on 07/30/2019 and at that time he was severely depressed.  His medications were readjusted to address his mood as well as sleep.  Patient today reports he has noticed some improvement .  He is sleeping better on the Zyprexa.  However it takes time once he takes it to fall asleep.  However once he falls asleep he is able to better sleep throughout the night than before.  He also takes trazodone in combination with the Zyprexa.  Last visit he had  reported that he was sleeping throughout the day and was not sleeping at night.  He did not express any depressive symptoms however per collateral information obtained from his mother last visit he gets like this when he decompensates although he is not always able to verbalize his depressive symptoms.  Patient denies any suicidality, homicidality or perceptual disturbances.  He reports he has upcoming appointment for [place] maintenance on April 5.  He plans to keep it.  He reports he missed his last appointment with his therapist due to phone connection problem.  He reports he has scheduled another appointment this afternoon and he is motivated to keep it.  Patient reports his wife is currently back from the hospital and that also helps.  Patient denies any other concerns today. Visit Diagnosis:    ICD-10-CM   1. MDD (major depressive disorder), recurrent episode, moderate (HCC)  F33.1   2. GAD (generalized anxiety disorder)  F41.1   3. Insomnia due to other mental disorder  F51.05    F99     Past Psychiatric History: I have reviewed past psychiatric history from my progress note on 01/15/2019.  Past trials of fluvoxamine, Rexulti, Xanax, Abilify.  Patient with multiple inpatient mental health admissions.  Most recently he had back-to-back inpatient mental health admission in August 2020, September 2020.  Patient did not tolerate ECT.  He completed [place] [facility] therapy on 05/20/2019.  Past Medical History:  Past Medical History:  Diagnosis Date  . Depression   . Diabetes (HCC)    Insulin Pump  . Diabetes mellitus type I ([place])   . Diabetes mellitus without  complication ([place])   . GERD (gastroesophageal reflux disease)   . H/O laryngectomy   . Heel bone fracture   . Hyperlipidemia   . Hypertension   . Radicular pain of right lower extremity   . Stroke ([place])   . Suicide attempt [facility]) 2014   damaged larynx - tracheostomy  . Thyroid disease     Past Surgical History:  Procedure Laterality Date   . COLONOSCOPY WITH PROPOFOL N/A 05/15/2018   Procedure: COLONOSCOPY WITH PROPOFOL;  Surgeon: [name], [name], MD;  Location: [facility] ENDOSCOPY;  Service: Gastroenterology;  Laterality: N/A;  . ESOPHAGOGASTRODUODENOSCOPY N/A 05/15/2018   Procedure: ESOPHAGOGASTRODUODENOSCOPY (EGD);  Surgeon: [name], [name], MD;  Location: [facility] ENDOSCOPY;  Service: Gastroenterology;  Laterality: N/A;  . FRACTURE SURGERY    . Heel bone reconstruction Left   . HERNIA REPAIR  57/3220   Umbilical hernia repair   . LARYNGECTOMY    . NECK SURGERY     fusion  . SPINE SURGERY    . TRACHEOSTOMY  2014   from [place] attempt    Family Psychiatric History: I have reviewed family psychiatric history from my progress note on 01/15/2019  Family History:  Family History  Problem Relation Age of Onset  . Osteoporosis Mother   . Diabetes Mother   . Hypertension Father   . Mental illness Neg Hx     Social History: Reviewed social history from my progress note on 01/15/2019 Social History   Socioeconomic History  . Marital status: Married    Spouse name: Not on file  . Number of children: Not on file  . Years of education: Not on file  . Highest education level: Not on file  Occupational History  . Not on file  Tobacco Use  . Smoking status: Former Smoker    Packs/day: 0.00    Types: Cigarettes    Quit date: 04/09/2013    Years since quitting: 6.3  . Smokeless tobacco: Never Used  Substance and Sexual Activity  . Alcohol use: Yes    Alcohol/week: 2.0 standard drinks    Types: 2 Shots of liquor per week    Comment: rare  . Drug use: No    Comment: Pt denied; UDS not available  . Sexual activity: Yes    Partners: Female    Birth control/protection: Condom  Other Topics Concern  . Not on file  Social History Narrative  . Not on file   Social Determinants of Health   Financial Resource Strain:   . Difficulty of Paying Living Expenses:   Food Insecurity:   . Worried About Charity fundraiser in the Last  Year:   . Arboriculturist in the Last Year:   Transportation Needs:   . Film/video editor (Medical):   Marland Kitchen Lack of Transportation (Non-Medical):   Physical Activity:   . Days of Exercise per Week:   . Minutes of Exercise per Session:   Stress:   . Feeling of Stress :   Social Connections:   . Frequency of Communication with Friends and Family:   . Frequency of Social Gatherings with Friends and Family:   . Attends Religious Services:   . Active Member of Clubs or Organizations:   . Attends Archivist Meetings:   Marland Kitchen Marital Status:     Allergies:  Allergies  Allergen Reactions  . Buspar [Buspirone]     Makes the patient "flip out"  . Depakote [Valproic Acid]     Causes excessive drowsiness  .  Clopidogrel Rash  . Gabapentin Itching and Rash    Metabolic Disorder Labs: Lab Results  Component Value Date   HGBA1C 7.8 (H) 01/01/2019   MPG 177.16 01/01/2019   MPG 174.29 12/23/2018   No results found for: PROLACTIN Lab Results  Component Value Date   CHOL 161 09/15/2016   TRIG 190 (H) 09/15/2016   HDL 41 09/15/2016   CHOLHDL 3.9 09/15/2016   VLDL 38 09/15/2016   LDLCALC 82 09/15/2016   Lab Results  Component Value Date   TSH 1.952 09/15/2016    Therapeutic Level Labs: No results found for: LITHIUM Lab Results  Component Value Date   VALPROATE 19 (L) 09/12/2016   No components found for:  CBMZ  Current Medications: Current Outpatient Medications  Medication Sig Dispense Refill  . albuterol (ACCUNEB) 1.25 MG/3ML nebulizer solution SMARTSIG:3 Milliliter(s) Via Nebulizer Every 6 Hours PRN    . amLODipine (NORVASC) 5 MG tablet Take 1 tablet (5 mg total) by mouth daily. 30 tablet 1  . aspirin EC 81 MG tablet Take 1 tablet (81 mg total) by mouth daily. 30 tablet 1  . buPROPion (WELLBUTRIN XL) 150 MG 24 hr tablet Take 1 tablet (150 mg total) by mouth daily. 90 tablet 0  . ciprofloxacin (CIPRO) 250 MG tablet Take 250 mg by mouth 2 (two) times daily.    .  citalopram (CELEXA) 40 MG tablet Take 1 tablet (40 mg total) by mouth daily. 90 tablet 0  . Continuous Blood Gluc Receiver (FREESTYLE LIBRE 14 DAY READER) DEVI FreeStyle Libre 14 Day Reader    . Continuous Blood Gluc Sensor (FREESTYLE LIBRE 14 DAY SENSOR) MISC FreeStyle Libre 14 Day Sensor kit    . empagliflozin (JARDIANCE) 25 MG TABS tablet Take 25 mg by mouth daily. 30 tablet 1  . glycopyrrolate (ROBINUL) 1 MG tablet Take by mouth.    Marland Kitchen glycopyrrolate (ROBINUL) 1 MG tablet     . hydrochlorothiazide (HYDRODIURIL) 25 MG tablet hydrochlorothiazide 25 mg tablet    . insulin aspart (NOVOLOG) 100 UNIT/ML injection INJECT UP TO 120 UNITS UNDER THE SKIN AS DIRECTED DAILY VIA INSULIN PUMP    . insulin aspart (NOVOLOG) 100 UNIT/ML injection INJECT UP TO 120 UNITS UNDER THE SKIN UTD D VIA INSULIN PUMP    . levofloxacin (LEVAQUIN) 750 MG tablet Take 750 mg by mouth daily.    Marland Kitchen levothyroxine (SYNTHROID) 75 MCG tablet TAKE 1 TABLET BY MOUTH DAILY AT 6 AM 90 tablet 0  . losartan-hydrochlorothiazide (HYZAAR) 100-25 MG tablet TAKE 1 TABLET BY MOUTH EVERY DAY    . OLANZapine (ZYPREXA) 5 MG tablet Take 1 tablet (5 mg total) by mouth at bedtime. 90 tablet 0  . pantoprazole (PROTONIX) 40 MG tablet     . pantoprazole (PROTONIX) 40 MG tablet TAKE 1 TABLET(40 MG) BY MOUTH TWICE DAILY    . rosuvastatin (CRESTOR) 20 MG tablet Take 20 mg by mouth at bedtime.    . rosuvastatin (CRESTOR) 20 MG tablet Take by mouth.    . simvastatin (ZOCOR) 20 MG tablet TAKE 1 TABLET(20 MG) BY MOUTH EVERY NIGHT    . tobramycin, PF, (TOBI) 300 MG/5ML nebulizer solution     . traMADol (ULTRAM) 50 MG tablet Take 100 mg by mouth every 6 (six) hours as needed.    . traZODone (DESYREL) 100 MG tablet Take 0.5-1 tablets (50-100 mg total) by mouth at bedtime as needed for sleep. 180 tablet 0   No current facility-administered medications for this visit.  Musculoskeletal: Strength & Muscle Tone: UTA Gait & Station: normal Patient leans:  N/A  Psychiatric Specialty Exam: Review of Systems  Psychiatric/Behavioral: Positive for dysphoric mood and sleep disturbance.  All other systems reviewed and are negative.   There were no vitals taken for this visit.There is no height or weight on file to calculate BMI.  General Appearance: Casual  Eye Contact:  Fair  Speech:  Slow history of tracheostomy  Volume:  Normal  Mood:  Dysphoric improving  Affect:  Congruent  Thought Process:  Goal Directed and Descriptions of Associations: Intact  Orientation:  Full (Time, Place, and Person)  Thought Content: Logical   Suicidal Thoughts:  No  Homicidal Thoughts:  No  Memory:  Immediate;   Fair Recent;   Fair Remote;   Fair  Judgement:  Fair  Insight:  Fair  Psychomotor Activity:  Normal  Concentration:  Concentration: Fair and Attention Span: Fair  Recall:  [employer] of Knowledge: Fair  Language: Fair  Akathisia:  No  Handed:  Right  AIMS (if indicated): UTA  Assets:  Communication Skills Desire for Improvement Housing Social Support  ADL's:  Intact  Cognition: WNL  Sleep:  Improved   Screenings: AUDIT     Admission (Discharged) from 01/22/2019 in [facility] Admission (Discharged) from 12/31/2018 in [facility] Admission (Discharged) from 09/14/2016 in [facility]  Alcohol Use Disorder Identification Test Final Score (AUDIT)  0  0  1    ECT-MADRS     Admission (Discharged) from 01/22/2019 in [facility] Total Score  27    Mini-Mental     Admission (Discharged) from 01/22/2019 in [facility]  Total Score (max 30 points )  30    PHQ2-9     Office Visit from 06/11/2019 in [facility] Office Visit from 03/13/2019 in [facility] Visit from 02/20/2019 in [facility] Office Visit from 02/10/2019 in [facility] Patient Outreach Telephone from 12/12/2017 in [place]  PHQ-2 Total Score  _0 0  PHQ-9 Total Score  --  _1 --       Assessment and Plan: [name] is a 59 year old Caucasian male, married, on disability, has a history of multiple medical problems, MDD, ADHD was evaluated by telemedicine today.  Patient is biologically predisposed given his multiple health issues.  He also has current psychosocial stressors of his wife's health issues, chronic mental health problems, physical limitations and the pandemic.  Patient had medication readjustment last visit for his mood and his sleep and is currently making some progress.  Patient also has maintenance TMS therapy scheduled.  Plan as noted below.  Plan MDD-some progress Celexa 40 mg p.o. daily Trazodone 50 to 100 mg p.o. nightly as needed-reduced dosage Zyprexa 5 mg p.o. nightly Wellbutrin XL 150 mg p.o. daily.  GAD-some progress Patient will continue to benefit from psychotherapy sessions and has upcoming appointment with his therapist Mr. [name]. Celexa as prescribed  Insomnia-improving Zyprexa 5 mg p.o. nightly Trazodone 50 to 100 mg p.o. nightly as needed  Patient has upcoming appointment for [place] maintenance therapy-on April 5.  Follow-up in clinic after [place] maintenance therapy-4 to 5 weeks.  Patient advised to reach out to writer if he notices any worsening symptoms or for further medication changes prior to his appointment.   I  have spent atleast 20 minutes non face to face with patient today. More than 50 % of the time was spent for preparing to see the patient ( e.g., review of test, records ), obtaining and to review and separately obtained history , ordering medications and test ,psychoeducation and supportive psychotherapy and care coordination,as well as documenting clinical information in electronic health record. This note was generated in part or whole with voice  recognition software. Voice recognition is usually quite accurate but there are transcription errors that can and very often do occur. I apologize for any typographical errors that were not detected and corrected.        Ursula Alert, MD 08/05/2019, 11:58 AM

## 2019-08-07 DIAGNOSIS — R809 Proteinuria, unspecified: Secondary | ICD-10-CM | POA: Diagnosis not present

## 2019-08-07 DIAGNOSIS — E1029 Type 1 diabetes mellitus with other diabetic kidney complication: Secondary | ICD-10-CM | POA: Diagnosis not present

## 2019-08-11 ENCOUNTER — Telehealth: Payer: Self-pay | Admitting: Psychiatry

## 2019-08-11 ENCOUNTER — Other Ambulatory Visit (HOSPITAL_COMMUNITY): Payer: PPO | Attending: Psychiatry | Admitting: Emergency Medicine

## 2019-08-11 ENCOUNTER — Other Ambulatory Visit: Payer: Self-pay

## 2019-08-11 DIAGNOSIS — F332 Major depressive disorder, recurrent severe without psychotic features: Secondary | ICD-10-CM

## 2019-08-11 NOTE — Telephone Encounter (Signed)
Returned call to patient since patient had left a message with front desk today.  Patient wonders whether he have to do all 36 treatments for [place] this time or not.  Advised patient to talk to Dr. Dwyane Dee as well as Ms. Loretha Brasil.  He has an appointment today.

## 2019-08-11 NOTE — Progress Notes (Signed)
Patient reported to [facility] for Repetitive Transcranial Magnetic Stimulation treatment for severe episode of recurrent major depressive disorder, without psychotic features. Patient presented with appropriate affect, level mood and denied any suicidal or homicidal ideations. Patient denies any other current symptoms and remains optimistic with continued [place] treatment. Patient reported no change in alcohol/substance use, caffeine consumption, sleep pattern or metal implant status since previous tx. Patient reported to tx session with pleasant affect.Pt responded well during the first round. Pt will do about 15 sessions the second around. Powerwas titrated to120% for the duration of txand will remain there until patient gets adjusted. Patient reported no complaints or discomfort. Patientdeparted post-treatment with no concerns or complaints.

## 2019-08-12 ENCOUNTER — Other Ambulatory Visit: Payer: Self-pay

## 2019-08-12 ENCOUNTER — Other Ambulatory Visit (HOSPITAL_COMMUNITY): Payer: PPO | Admitting: Emergency Medicine

## 2019-08-12 DIAGNOSIS — F332 Major depressive disorder, recurrent severe without psychotic features: Secondary | ICD-10-CM

## 2019-08-12 NOTE — Progress Notes (Signed)
Patient reported to [facility] for Repetitive Transcranial Magnetic Stimulation treatment for severe episode of recurrent major depressive disorder, without psychotic features. Patient presented with appropriate affect, level mood and denied any suicidal or homicidal ideations. Patient denies any other current symptoms and remains optimistic with continued [place] treatment. Patient reported no change in alcohol/substance use, caffeine consumption, sleep pattern or metal implant status since previous tx. Patient reported to tx session with pleasant affect.Pt responded well during the first round. Pt will do about 15 sessions the second around. Powerwas titrated to120% for the duration of txand will remain there until patient gets adjusted. Patient reported no complaints or discomfort. Patientdeparted post-treatment with no concerns or complaints.

## 2019-08-13 ENCOUNTER — Other Ambulatory Visit: Payer: Self-pay

## 2019-08-13 ENCOUNTER — Other Ambulatory Visit (HOSPITAL_COMMUNITY): Payer: PPO | Admitting: Emergency Medicine

## 2019-08-13 DIAGNOSIS — F332 Major depressive disorder, recurrent severe without psychotic features: Secondary | ICD-10-CM

## 2019-08-13 NOTE — Progress Notes (Signed)
Patient reported to [facility] for Repetitive Transcranial Magnetic Stimulation treatment for severe episode of recurrent major depressive disorder, without psychotic features. Patient presented with appropriate affect, level mood and denied any suicidal or homicidal ideations. Patient denies any other current symptoms and remains optimistic with continued [place] treatment. Patient reported no change in alcohol/substance use, caffeine consumption, sleep pattern or metal implant status since previous tx. Patient reported to tx session with pleasant affect. Powerwas titrated to120% for the duration of txand will remain there until patient gets adjusted. Patient reported no complaints or discomfort. Patientdeparted post-treatment with no concerns or complaints.

## 2019-08-14 ENCOUNTER — Other Ambulatory Visit (HOSPITAL_COMMUNITY): Payer: PPO | Admitting: Emergency Medicine

## 2019-08-14 ENCOUNTER — Other Ambulatory Visit: Payer: Self-pay

## 2019-08-14 ENCOUNTER — Encounter (HOSPITAL_COMMUNITY): Payer: PPO

## 2019-08-14 DIAGNOSIS — F332 Major depressive disorder, recurrent severe without psychotic features: Secondary | ICD-10-CM

## 2019-08-14 NOTE — Progress Notes (Signed)
Patient reported to [facility] for Repetitive Transcranial Magnetic Stimulation treatment for severe episode of recurrent major depressive disorder, without psychotic features. Patient presented with appropriate affect, level mood and denied any suicidal or homicidal ideations. Patient denies any other current symptoms and remains optimistic with continued [place] treatment. Patient reported no change in alcohol/substance use, caffeine consumption, sleep pattern or metal implant status since previous tx. Patient reported to tx session with pleasant affect.Ptresponded well during the first round. Pt will do about 15 sessions the second around.Powerwas titrated to120% for the duration of txand will remain there until patient gets adjusted. Patient reported no complaints or discomfort. Patientdeparted post-treatment with no concerns or complaints.

## 2019-08-15 ENCOUNTER — Encounter (HOSPITAL_COMMUNITY): Payer: PPO

## 2019-08-18 ENCOUNTER — Other Ambulatory Visit: Payer: Self-pay

## 2019-08-18 ENCOUNTER — Other Ambulatory Visit (INDEPENDENT_AMBULATORY_CARE_PROVIDER_SITE_OTHER): Payer: PPO | Admitting: Emergency Medicine

## 2019-08-18 DIAGNOSIS — F332 Major depressive disorder, recurrent severe without psychotic features: Secondary | ICD-10-CM

## 2019-08-18 DIAGNOSIS — R809 Proteinuria, unspecified: Secondary | ICD-10-CM | POA: Diagnosis not present

## 2019-08-18 DIAGNOSIS — E1029 Type 1 diabetes mellitus with other diabetic kidney complication: Secondary | ICD-10-CM | POA: Diagnosis not present

## 2019-08-18 NOTE — Progress Notes (Signed)
Patient reported to [facility] for Repetitive Transcranial Magnetic Stimulation treatment for severe episode of recurrent major depressive disorder, without psychotic features. Patient presented with appropriate affect, level mood and denied any suicidal or homicidal ideations. Patient denies any other current symptoms and remains optimistic with continued [place] treatment. Patient reported no change in alcohol/substance use, caffeine consumption, sleep pattern or metal implant status since previous tx. Patient reported to tx session with pleasant affect..Powerwas titrated to120% for the duration of txand will remain there until patient gets adjusted. Patient reported no complaints or discomfort. Patientdeparted post-treatment with no concerns or complaints.

## 2019-08-19 ENCOUNTER — Other Ambulatory Visit (HOSPITAL_COMMUNITY): Payer: PPO | Admitting: Emergency Medicine

## 2019-08-19 DIAGNOSIS — J4530 Mild persistent asthma, uncomplicated: Secondary | ICD-10-CM | POA: Diagnosis not present

## 2019-08-19 DIAGNOSIS — F332 Major depressive disorder, recurrent severe without psychotic features: Secondary | ICD-10-CM

## 2019-08-19 NOTE — Progress Notes (Signed)
Patient reported to [facility] for Repetitive Transcranial Magnetic Stimulation treatment for severe episode of recurrent major depressive disorder, without psychotic features. Patient presented with appropriate affect, level mood and denied any suicidal or homicidal ideations. Patient denies any other current symptoms and remains optimistic with continued [place] treatment. Patient reported no change in alcohol/substance use, caffeine consumption, sleep pattern or metal implant status since previous tx. Patient reported to tx session with pleasant affect..Powerwas titrated to120% for the duration of txand will remain there until patient gets adjusted. Patient reported no complaints or discomfort. Patientdeparted post-treatment with no concerns or complaints.

## 2019-08-20 ENCOUNTER — Other Ambulatory Visit: Payer: Self-pay

## 2019-08-20 ENCOUNTER — Other Ambulatory Visit (INDEPENDENT_AMBULATORY_CARE_PROVIDER_SITE_OTHER): Payer: PPO | Admitting: Emergency Medicine

## 2019-08-20 DIAGNOSIS — F332 Major depressive disorder, recurrent severe without psychotic features: Secondary | ICD-10-CM | POA: Diagnosis not present

## 2019-08-20 DIAGNOSIS — E1029 Type 1 diabetes mellitus with other diabetic kidney complication: Secondary | ICD-10-CM | POA: Diagnosis not present

## 2019-08-20 DIAGNOSIS — R809 Proteinuria, unspecified: Secondary | ICD-10-CM | POA: Diagnosis not present

## 2019-08-20 NOTE — Progress Notes (Signed)
Patient reported to [facility] for Repetitive Transcranial Magnetic Stimulation treatment for severe episode of recurrent major depressive disorder, without psychotic features. Patient presented with appropriate affect, level mood and denied any suicidal or homicidal ideations. Patient denies any other current symptoms and remains optimistic with continued [place] treatment. Patient reported no change in alcohol/substance use, caffeine consumption, sleep pattern or metal implant status since previous tx. Patient reported to tx session with pleasant affect..Powerwas titrated to120% for the duration of txand will remain there until patient gets adjusted. Patient reported no complaints or discomfort. Patientdeparted post-treatment with no concerns or complaints.

## 2019-08-21 ENCOUNTER — Other Ambulatory Visit: Payer: Self-pay

## 2019-08-21 ENCOUNTER — Other Ambulatory Visit (HOSPITAL_COMMUNITY): Payer: PPO | Admitting: Emergency Medicine

## 2019-08-21 DIAGNOSIS — F332 Major depressive disorder, recurrent severe without psychotic features: Secondary | ICD-10-CM

## 2019-08-21 NOTE — Progress Notes (Signed)
Patient reported to [facility] for Repetitive Transcranial Magnetic Stimulation treatment for severe episode of recurrent major depressive disorder, without psychotic features. Patient presented with appropriate affect, level mood and denied any suicidal or homicidal ideations. Patient denies any other current symptoms and remains optimistic with continued [place] treatment. Patient reported no change in alcohol/substance use, caffeine consumption, sleep pattern or metal implant status since previous tx. Patient reported to tx session with pleasant affect..Powerwas titrated to120% for the duration of txand will remain there until patient gets adjusted. Patient reported no complaints or discomfort. Patientdeparted post-treatment with no concerns or complaints.

## 2019-08-22 ENCOUNTER — Other Ambulatory Visit: Payer: Self-pay

## 2019-08-22 ENCOUNTER — Other Ambulatory Visit (HOSPITAL_COMMUNITY): Payer: PPO | Admitting: Emergency Medicine

## 2019-08-22 DIAGNOSIS — F332 Major depressive disorder, recurrent severe without psychotic features: Secondary | ICD-10-CM

## 2019-08-22 NOTE — Progress Notes (Signed)
Patient reported to [facility] for Repetitive Transcranial Magnetic Stimulation treatment for severe episode of recurrent major depressive disorder, without psychotic features. Patient presented with appropriate affect, level mood and denied any suicidal or homicidal ideations. Patient denies any other current symptoms and remains optimistic with continued [place] treatment. Patient reported no change in alcohol/substance use, caffeine consumption, sleep pattern or metal implant status since previous tx. Patient reported to tx session with pleasant affect..Powerwas titrated to120% for the duration of txand will remain there until patient gets adjusted. Patient reported no complaints or discomfort. Patientdeparted post-treatment with no concerns or complaints.

## 2019-08-25 ENCOUNTER — Other Ambulatory Visit: Payer: Self-pay

## 2019-08-25 ENCOUNTER — Other Ambulatory Visit (INDEPENDENT_AMBULATORY_CARE_PROVIDER_SITE_OTHER): Payer: PPO | Admitting: Emergency Medicine

## 2019-08-25 DIAGNOSIS — F332 Major depressive disorder, recurrent severe without psychotic features: Secondary | ICD-10-CM | POA: Diagnosis not present

## 2019-08-25 NOTE — Progress Notes (Signed)
Patient reported to [facility] for Repetitive Transcranial Magnetic Stimulation treatment for severe episode of recurrent major depressive disorder, without psychotic features. Patient presented with appropriate affect, level mood and denied any suicidal or homicidal ideations. Patient denies any other current symptoms and remains optimistic with continued [place] treatment. Patient reported no change in alcohol/substance use, caffeine consumption, sleep pattern or metal implant status since previous tx. Patient reported to tx session with pleasant affect.Powerwas titrated to120% for the duration of txand will remain there until patient gets adjusted. Patient reported no complaints or discomfort. Patientdeparted post-treatment with no concerns or complaints.

## 2019-08-26 ENCOUNTER — Other Ambulatory Visit: Payer: Self-pay

## 2019-08-26 ENCOUNTER — Other Ambulatory Visit (INDEPENDENT_AMBULATORY_CARE_PROVIDER_SITE_OTHER): Payer: PPO | Admitting: Emergency Medicine

## 2019-08-26 DIAGNOSIS — F332 Major depressive disorder, recurrent severe without psychotic features: Secondary | ICD-10-CM | POA: Diagnosis not present

## 2019-08-26 NOTE — Progress Notes (Signed)
Patient reported to [facility] for Repetitive Transcranial Magnetic Stimulation treatment for severe episode of recurrent major depressive disorder, without psychotic features. Patient presented with appropriate affect, level mood and denied any suicidal or homicidal ideations. Patient denies any other current symptoms and remains optimistic with continued [place] treatment. Patient reported no change in alcohol/substance use, caffeine consumption, sleep pattern or metal implant status since previous tx. Patient reported to tx session with pleasant affect. Powerwas titrated to120% for the duration of txand will remain there until patient gets adjusted. Patient reported no complaints or discomfort. Patientdeparted post-treatment with no concerns or complaints.

## 2019-08-27 ENCOUNTER — Encounter (HOSPITAL_COMMUNITY): Payer: PPO

## 2019-08-28 ENCOUNTER — Other Ambulatory Visit (INDEPENDENT_AMBULATORY_CARE_PROVIDER_SITE_OTHER): Payer: PPO | Admitting: Emergency Medicine

## 2019-08-28 ENCOUNTER — Other Ambulatory Visit: Payer: Self-pay

## 2019-08-28 DIAGNOSIS — F332 Major depressive disorder, recurrent severe without psychotic features: Secondary | ICD-10-CM

## 2019-08-28 NOTE — Progress Notes (Signed)
Patient reported to [facility] for Repetitive Transcranial Magnetic Stimulation treatment for severe episode of recurrent major depressive disorder, without psychotic features. Patient presented with appropriate affect, level mood and denied any suicidal or homicidal ideations. Patient denies any other current symptoms and remains optimistic with continued TMS treatment. Patient reported no change in alcohol/substance use, caffeine consumption, sleep pattern or metal implant status since previous tx. Patient reported to tx session with pleasant affect.Pt watched TV.Powerwas titrated to120% for the duration of txand will remain there until patient gets adjusted. Patient reported no complaints or discomfort. Patientdeparted post-treatment with no concerns or complaints. 

## 2019-08-29 ENCOUNTER — Other Ambulatory Visit: Payer: Self-pay

## 2019-08-29 ENCOUNTER — Other Ambulatory Visit (INDEPENDENT_AMBULATORY_CARE_PROVIDER_SITE_OTHER): Payer: PPO | Admitting: Emergency Medicine

## 2019-08-29 DIAGNOSIS — F332 Major depressive disorder, recurrent severe without psychotic features: Secondary | ICD-10-CM

## 2019-08-29 NOTE — Progress Notes (Signed)
Patient reported to [facility] for Repetitive Transcranial Magnetic Stimulation treatment for severe episode of recurrent major depressive disorder, without psychotic features. Patient presented with appropriate affect, level mood and denied any suicidal or homicidal ideations. Patient denies any other current symptoms and remains optimistic with continued TMS treatment. Patient reported no change in alcohol/substance use, caffeine consumption, sleep pattern or metal implant status since previous tx. Patient reported to tx session with pleasant affect.Pt watched TV.Powerwas titrated to120% for the duration of txand will remain there until patient gets adjusted. Patient reported no complaints or discomfort. Patientdeparted post-treatment with no concerns or complaints. 

## 2019-09-01 ENCOUNTER — Other Ambulatory Visit: Payer: Self-pay

## 2019-09-01 ENCOUNTER — Other Ambulatory Visit (HOSPITAL_COMMUNITY): Payer: PPO | Admitting: Emergency Medicine

## 2019-09-01 DIAGNOSIS — F332 Major depressive disorder, recurrent severe without psychotic features: Secondary | ICD-10-CM

## 2019-09-01 NOTE — Progress Notes (Signed)
Patient reported to [facility] for Repetitive Transcranial Magnetic Stimulation treatment for severe episode of recurrent major depressive disorder, without psychotic features. Patient presented with appropriate affect, level mood and denied any suicidal or homicidal ideations. Patient denies any other current symptoms and remains optimistic with continued TMS treatment. Patient reported no change in alcohol/substance use, caffeine consumption, sleep pattern or metal implant status since previous tx. Patient reported to tx session with pleasant affect.Pt watched TV.Powerwas titrated to120% for the duration of txand will remain there until patient gets adjusted. Patient reported no complaints or discomfort. Patientdeparted post-treatment with no concerns or complaints. 

## 2019-09-02 ENCOUNTER — Other Ambulatory Visit: Payer: Self-pay

## 2019-09-02 ENCOUNTER — Telehealth (INDEPENDENT_AMBULATORY_CARE_PROVIDER_SITE_OTHER): Payer: PPO | Admitting: Clinical

## 2019-09-02 ENCOUNTER — Encounter (HOSPITAL_COMMUNITY): Payer: PPO

## 2019-09-02 ENCOUNTER — Other Ambulatory Visit (HOSPITAL_COMMUNITY): Payer: PPO | Admitting: Emergency Medicine

## 2019-09-02 DIAGNOSIS — F411 Generalized anxiety disorder: Secondary | ICD-10-CM | POA: Diagnosis not present

## 2019-09-02 DIAGNOSIS — F332 Major depressive disorder, recurrent severe without psychotic features: Secondary | ICD-10-CM

## 2019-09-02 NOTE — Progress Notes (Signed)
Patient reported to [facility] for Repetitive Transcranial Magnetic Stimulation treatment for severe episode of recurrent major depressive disorder, without psychotic features. Patient presented with appropriate affect, level mood and denied any suicidal or homicidal ideations. Patient denies any other current symptoms and remains optimistic with continued TMS treatment. Patient reported no change in alcohol/substance use, caffeine consumption, sleep pattern or metal implant status since previous tx. Patient reported to tx session with pleasant affect.Pt watched TV.Powerwas titrated to120% for the duration of txand will remain there until patient gets adjusted. Patient reported no complaints or discomfort. Patientdeparted post-treatment with no concerns or complaints. 

## 2019-09-02 NOTE — Progress Notes (Signed)
Virtual Visit via Video Note  I connected with [name] on 09/02/19 at  2:00 PM EDT by a video enabled telemedicine application and verified that I am speaking with the correct person using two identifiers.  Location: Patient: Home Provider: Office   I discussed the limitations of evaluation and management by telemedicine and the availability of in person appointments. The patient expressed understanding and agreed to proceed.   THERAPIST PROGRESS NOTE  Session Time: 2:00PM-2:40PM  Participation Level: Active  Behavioral Response: CasualAlertEuphoric  Type of Therapy: Individual Therapy  Treatment Goals addressed: Coping  Interventions: CBT, Solution Focused, Strength-based and Supportive  Summary: Astin Hankinson is a 59 y.o. male who presents with Major Depression and Generalized Anxiety.The OPT therapist worked with the patient for his ongoing OPT treatment. The OPT therapist utilized Motivational Interviewing to assist in creating therapeutic repore. The patient in the session was engaged and work in collaboration giving feedback about his triggers and symptoms over the past few weeks including interactions with his significant other, social interactions, and activity levels. The OPT therapist utilized Cognitive Behavioral Therapy through cognitive restructuring as well as worked with the patient on coping strategies to assist in management of Anxietyand Depression. The OPT therapist inquired for holistic care about the patients adherence to medication therapy. The patient continued toindicate that post his last medication change he continues to significantly improve having more energy, higher activity level, and better interaction with his significant other.  Suicidal/Homicidal: Nowithout intent/plan  Therapist Response: The OPT therapist worked with the patient for the patients  scheduled session. The patient was engaged in his session and gave feedback in  relation to triggers, symptoms, and behavior responses over the past few weeks. The OPT therapist worked with the patient utilizing an in session Cognitive Behavioral Therapy exercise. The patient was responsive in the session and verbalized, " I am continuing to feel better things are continuing to go well since the last time we talked and I have been getting outside and working on my garden more and working in the yard more, my interactions have improved and I think that's due to some changes I am making".  The patient indicates improvement at home in his interactions with his wife and more frequent interactions with other family members . The OPT therapist will continue treatment work with the patient in his next scheduled session.  Plan: Return again in 2/3 weeks.  Diagnosis:      Axis I: Generalized Anxiety Disorder and Major Depression, Recurrent severe                          Axis II: No diagnosis    I discussed the assessment and treatment plan with the patient. The patient was provided an opportunity to ask questions and all were answered. The patient agreed with the plan and demonstrated an understanding of the instructions.   The patient was advised to call back or seek an in-person evaluation if the symptoms worsen or if the condition fails to improve as anticipated.  I provided 40 minutes of non-face-to-face time during this encounter.   [name], LCSW

## 2019-09-03 DIAGNOSIS — E1029 Type 1 diabetes mellitus with other diabetic kidney complication: Secondary | ICD-10-CM | POA: Diagnosis not present

## 2019-09-03 DIAGNOSIS — R809 Proteinuria, unspecified: Secondary | ICD-10-CM | POA: Diagnosis not present

## 2019-09-03 DIAGNOSIS — E785 Hyperlipidemia, unspecified: Secondary | ICD-10-CM | POA: Diagnosis not present

## 2019-09-03 DIAGNOSIS — I10 Essential (primary) hypertension: Secondary | ICD-10-CM | POA: Diagnosis not present

## 2019-09-03 DIAGNOSIS — E1159 Type 2 diabetes mellitus with other circulatory complications: Secondary | ICD-10-CM | POA: Diagnosis not present

## 2019-09-03 DIAGNOSIS — E1069 Type 1 diabetes mellitus with other specified complication: Secondary | ICD-10-CM | POA: Diagnosis not present

## 2019-09-05 ENCOUNTER — Other Ambulatory Visit: Payer: Self-pay | Admitting: Psychiatry

## 2019-09-05 DIAGNOSIS — F411 Generalized anxiety disorder: Secondary | ICD-10-CM

## 2019-09-05 DIAGNOSIS — F3342 Major depressive disorder, recurrent, in full remission: Secondary | ICD-10-CM

## 2019-09-08 DIAGNOSIS — J40 Bronchitis, not specified as acute or chronic: Secondary | ICD-10-CM | POA: Diagnosis not present

## 2019-09-09 ENCOUNTER — Encounter (HOSPITAL_COMMUNITY): Payer: PPO

## 2019-09-17 ENCOUNTER — Encounter: Payer: Self-pay | Admitting: Psychiatry

## 2019-09-17 ENCOUNTER — Telehealth (INDEPENDENT_AMBULATORY_CARE_PROVIDER_SITE_OTHER): Payer: PPO | Admitting: Psychiatry

## 2019-09-17 ENCOUNTER — Other Ambulatory Visit: Payer: Self-pay

## 2019-09-17 DIAGNOSIS — F5105 Insomnia due to other mental disorder: Secondary | ICD-10-CM | POA: Diagnosis not present

## 2019-09-17 DIAGNOSIS — E1029 Type 1 diabetes mellitus with other diabetic kidney complication: Secondary | ICD-10-CM | POA: Diagnosis not present

## 2019-09-17 DIAGNOSIS — F99 Mental disorder, not otherwise specified: Secondary | ICD-10-CM

## 2019-09-17 DIAGNOSIS — F3342 Major depressive disorder, recurrent, in full remission: Secondary | ICD-10-CM | POA: Diagnosis not present

## 2019-09-17 DIAGNOSIS — F411 Generalized anxiety disorder: Secondary | ICD-10-CM | POA: Diagnosis not present

## 2019-09-17 DIAGNOSIS — R809 Proteinuria, unspecified: Secondary | ICD-10-CM | POA: Diagnosis not present

## 2019-09-17 MED ORDER — TRAZODONE HCL 100 MG PO TABS: 50.0000 mg | ORAL_TABLET | Freq: Every evening | ORAL | 0 refills | Status: DC | PRN

## 2019-09-17 NOTE — Progress Notes (Signed)
Provider Location : [facility] Patient Location : Home  Virtual Visit via Video Note  I connected with [name] on 09/17/19 at 11:30 AM EDT by a video enabled telemedicine application and verified that I am speaking with the correct person using two identifiers.   I discussed the limitations of evaluation and management by telemedicine and the availability of in person appointments. The patient expressed understanding and agreed to proceed.    I discussed the assessment and treatment plan with the patient. The patient was provided an opportunity to ask questions and all were answered. The patient agreed with the plan and demonstrated an understanding of the instructions.   The patient was advised to call back or seek an in-person evaluation if the symptoms worsen or if the condition fails to improve as anticipated.   [place] MD OP Progress Note  09/17/2019 11:49 AM [name]  MRN:  [id number]  Chief Complaint:  Chief Complaint    Follow-up     HPI: [name] is a 59 year old Caucasian male, married, lives in [place], has a history of depression, anxiety, multiple medical problems including history of tracheal stenosis status post multiple reintubation all during one hospitalization post acute hypoxemic respiratory failure due to opioid overdose, history of CVA with right-sided hemiparesis currently resolved, diabetes, hypertension, NSTEMI, hypothyroidism, hyperlipidemia, chronic pain was evaluated by telemedicine today.  Patient today reports he recently completed 15 sessions of [place] therapy.  He reports he currently feels much better.  He denies any depression or anxiety.  He reports sleep as good.  He reports appetite is fair.  He denies any suicidality, homicidality or perceptual disturbances.  He appeared to be alert and oriented in session today.  He reports he has been spending time in his yard and is currently planting a garden for himself.  He enjoys  doing that.  He reports he continues to follow-up with his other providers.  He is compliant on medications and denies side effects.  Patient denies any other concerns today.  I have reviewed medical records in [facility] per Ms.Lovepriya -dated 09/02/2019-patient completed [place] treatment.'    Visit Diagnosis:    ICD-10-CM   1. MDD (major depressive disorder), recurrent, in full remission ([place])  F33.42   2. GAD (generalized anxiety disorder)  F41.1 traZODone (DESYREL) 100 MG tablet  3. Insomnia due to other mental disorder  F51.05    F99     Past Psychiatric History: I have reviewed past psychiatric history from my progress note on 01/15/2019.  Past trials of fluvoxamine, Rexulti, Xanax, Abilify.  Patient with multiple inpatient mental health admissions.  Most recently had back-to-back inpatient mental health admission in August 2020, September 2020.  Patient did not tolerate ECT.  Patient completed [place] on 05/20/2019 and recently 15 sessions of [place] on September 02, 2019.  Past Medical History:  Past Medical History:  Diagnosis Date  . Depression   . Diabetes (HCC)    Insulin Pump  . Diabetes mellitus type I ([place])   . Diabetes mellitus without complication ([place])   . GERD (gastroesophageal reflux disease)   . H/O laryngectomy   . Heel bone fracture   . Hyperlipidemia   . Hypertension   . Radicular pain of right lower extremity   . Stroke ([place])   . Suicide attempt [facility] 2014   damaged larynx - tracheostomy  . Thyroid disease     Past Surgical History:  Procedure Laterality Date  . COLONOSCOPY WITH PROPOFOL N/A 05/15/2018  Procedure: COLONOSCOPY WITH PROPOFOL;  Surgeon: [name], [name], MD;  Location: [facility] ENDOSCOPY;  Service: Gastroenterology;  Laterality: N/A;  . ESOPHAGOGASTRODUODENOSCOPY N/A 05/15/2018   Procedure: ESOPHAGOGASTRODUODENOSCOPY (EGD);  Surgeon: [name], [name], MD;  Location: [facility] ENDOSCOPY;  Service: Gastroenterology;  Laterality: N/A;  . FRACTURE SURGERY    . Heel  bone reconstruction Left   . HERNIA REPAIR  16/1096   Umbilical hernia repair   . LARYNGECTOMY    . NECK SURGERY     fusion  . SPINE SURGERY    . TRACHEOSTOMY  2014   from [place] attempt    Family Psychiatric History: I have reviewed family psychiatric history from my progress note on 01/15/2019  Family History:  Family History  Problem Relation Age of Onset  . Osteoporosis Mother   . Diabetes Mother   . Hypertension Father   . Mental illness Neg Hx     Social History: Reviewed social history from my progress note on 01/15/2019 Social History   Socioeconomic History  . Marital status: Married    Spouse name: Not on file  . Number of children: Not on file  . Years of education: Not on file  . Highest education level: Not on file  Occupational History  . Not on file  Tobacco Use  . Smoking status: Former Smoker    Packs/day: 0.00    Types: Cigarettes    Quit date: 04/09/2013    Years since quitting: 6.4  . Smokeless tobacco: Never Used  Substance and Sexual Activity  . Alcohol use: Yes    Alcohol/week: 2.0 standard drinks    Types: 2 Shots of liquor per week    Comment: rare  . Drug use: No    Comment: Pt denied; UDS not available  . Sexual activity: Yes    Partners: Female    Birth control/protection: Condom  Other Topics Concern  . Not on file  Social History Narrative  . Not on file   Social Determinants of Health   Financial Resource Strain:   . Difficulty of Paying Living Expenses:   Food Insecurity:   . Worried About Charity fundraiser in the Last Year:   . Arboriculturist in the Last Year:   Transportation Needs:   . Film/video editor (Medical):   Marland Kitchen Lack of Transportation (Non-Medical):   Physical Activity:   . Days of Exercise per Week:   . Minutes of Exercise per Session:   Stress:   . Feeling of Stress :   Social Connections:   . Frequency of Communication with Friends and Family:   . Frequency of Social Gatherings with Friends and Family:    . Attends Religious Services:   . Active Member of Clubs or Organizations:   . Attends Archivist Meetings:   Marland Kitchen Marital Status:     Allergies:  Allergies  Allergen Reactions  . Buspar [Buspirone]     Makes the patient "flip out"  . Depakote [Valproic Acid]     Causes excessive drowsiness  . Clopidogrel Rash  . Gabapentin Itching and Rash    Metabolic Disorder Labs: Lab Results  Component Value Date   HGBA1C 7.8 (H) 01/01/2019   MPG 177.16 01/01/2019   MPG 174.29 12/23/2018   No results found for: PROLACTIN Lab Results  Component Value Date   CHOL 161 09/15/2016   TRIG 190 (H) 09/15/2016   HDL 41 09/15/2016   CHOLHDL 3.9 09/15/2016   VLDL 38 09/15/2016  [place] 82 09/15/2016   Lab Results  Component Value Date   TSH 1.952 09/15/2016    Therapeutic Level Labs: No results found for: LITHIUM Lab Results  Component Value Date   VALPROATE 19 (L) 09/12/2016   No components found for:  CBMZ  Current Medications: Current Outpatient Medications  Medication Sig Dispense Refill  . albuterol (ACCUNEB) 1.25 MG/3ML nebulizer solution SMARTSIG:3 Milliliter(s) Via Nebulizer Every 6 Hours PRN    . amLODipine (NORVASC) 5 MG tablet Take 1 tablet (5 mg total) by mouth daily. 30 tablet 1  . aspirin EC 81 MG tablet Take 1 tablet (81 mg total) by mouth daily. 30 tablet 1  . buPROPion (WELLBUTRIN XL) 150 MG 24 hr tablet TAKE 1 TABLET(150 MG) BY MOUTH DAILY 90 tablet 0  . ciprofloxacin (CIPRO) 250 MG tablet Take 250 mg by mouth 2 (two) times daily.    . citalopram (CELEXA) 40 MG tablet TAKE 1 TABLET(40 MG) BY MOUTH DAILY 90 tablet 0  . Continuous Blood Gluc Receiver (FREESTYLE LIBRE 14 DAY READER) DEVI FreeStyle Libre 14 Day Reader    . Continuous Blood Gluc Sensor (FREESTYLE LIBRE 14 DAY SENSOR) MISC FreeStyle Libre 14 Day Sensor kit    . empagliflozin (JARDIANCE) 25 MG TABS tablet Take 25 mg by mouth daily. 30 tablet 1  . glycopyrrolate (ROBINUL) 1 MG tablet Take by  mouth.    Marland Kitchen glycopyrrolate (ROBINUL) 1 MG tablet     . hydrochlorothiazide (HYDRODIURIL) 25 MG tablet hydrochlorothiazide 25 mg tablet    . insulin aspart (NOVOLOG) 100 UNIT/ML injection INJECT UP TO 120 UNITS UNDER THE SKIN AS DIRECTED DAILY VIA INSULIN PUMP    . insulin aspart (NOVOLOG) 100 UNIT/ML injection INJECT UP TO 120 UNITS UNDER THE SKIN UTD D VIA INSULIN PUMP    . levofloxacin (LEVAQUIN) 750 MG tablet Take 750 mg by mouth daily.    Marland Kitchen levothyroxine (SYNTHROID) 75 MCG tablet TAKE 1 TABLET BY MOUTH DAILY AT 6 AM 90 tablet 0  . losartan-hydrochlorothiazide (HYZAAR) 100-25 MG tablet TAKE 1 TABLET BY MOUTH EVERY DAY    . OLANZapine (ZYPREXA) 5 MG tablet Take 1 tablet (5 mg total) by mouth at bedtime. 90 tablet 0  . pantoprazole (PROTONIX) 40 MG tablet     . pantoprazole (PROTONIX) 40 MG tablet TAKE 1 TABLET(40 MG) BY MOUTH TWICE DAILY    . rosuvastatin (CRESTOR) 20 MG tablet Take 20 mg by mouth at bedtime.    . rosuvastatin (CRESTOR) 20 MG tablet Take by mouth.    . simvastatin (ZOCOR) 20 MG tablet TAKE 1 TABLET(20 MG) BY MOUTH EVERY NIGHT    . tobramycin, PF, (TOBI) 300 MG/5ML nebulizer solution     . traMADol (ULTRAM) 50 MG tablet Take 100 mg by mouth every 6 (six) hours as needed.    . traZODone (DESYREL) 100 MG tablet Take 0.5-1 tablets (50-100 mg total) by mouth at bedtime as needed for sleep. 180 tablet 0   No current facility-administered medications for this visit.     Musculoskeletal: Strength & Muscle Tone: UTA Gait & Station: normal Patient leans: N/A  Psychiatric Specialty Exam: Review of Systems  Psychiatric/Behavioral: Negative for agitation, behavioral problems, confusion, decreased concentration, dysphoric mood, hallucinations, self-injury, sleep disturbance and suicidal ideas. The patient is not nervous/anxious and is not hyperactive.   All other systems reviewed and are negative.   There were no vitals taken for this visit.There is no height or weight on file to  calculate BMI.  General Appearance:  Casual  Eye Contact:  Fair  Speech:  Clear and Coherent  Volume:  Normal  Mood:  Euthymic  Affect:  Congruent  Thought Process:  Goal Directed and Descriptions of Associations: Intact  Orientation:  Full (Time, Place, and Person)  Thought Content: Logical   Suicidal Thoughts:  No  Homicidal Thoughts:  No  Memory:  Immediate;   Fair Recent;   Fair Remote;   Fair  Judgement:  Fair  Insight:  Fair  Psychomotor Activity:  Normal  Concentration:  Concentration: Fair and Attention Span: Fair  Recall:  [employer] of Knowledge: Fair  Language: Fair  Akathisia:  No  Handed:  Right  AIMS (if indicated):UTA  Assets:  Communication Skills Desire for Improvement Housing Social Support  ADL's:  Intact  Cognition: WNL  Sleep:  Fair   Screenings: AUDIT     Admission (Discharged) from 01/22/2019 in [facility] Admission (Discharged) from 12/31/2018 in [facility] Admission (Discharged) from 09/14/2016 in [facility]  Alcohol Use Disorder Identification Test Final Score (AUDIT)  0  0  1    ECT-MADRS     Admission (Discharged) from 01/22/2019 in [facility] Total Score  27    Mini-Mental     Admission (Discharged) from 01/22/2019 in [facility]  Total Score (max 30 points )  30    PHQ2-9     Video Visit from 09/17/2019 in [facility] Office Visit from 06/11/2019 in [facility] Office Visit from 03/13/2019 in [facility] Visit from 02/20/2019 in [facility] Office Visit from 02/10/2019 in [facility]  PHQ-2 Total Score  _0 PHQ-9 Total Score  3  --  _1 Assessment and Plan: Aldahir Litaker is a 59 year old Caucasian male, married, on disability, has a  history of multiple medical problems, MDD, ADHD was evaluated by telemedicine today.  Patient is biologically predisposed given her's multiple health issues.  Patient with psychosocial stressors of the pandemic, his own health issues, his wife's health problems currently is making progress.  He recently completed [facility] on September 02, 2019.  Patient is currently doing well and is compliant with medications.  Plan as noted below.  Plan MDD in remission PHQ 9 today equals 3 Celexa 40 mg p.o. daily Trazodone 50 to 100 mg p.o. nightly as needed at reduced dosage Zyprexa 5 mg p.o. nightly Wellbutrin XL 150 mg p.o. daily  GAD-stable Continue CBT with Mr. [name] Celexa as prescribed  Insomnia-stable Zyprexa 5 mg p.o. nightly Trazodone 50 to 100 mg p.o. nightly as needed  I have reviewed notes in [facility] for Ms. Lovepriya dated 09/02/2019 as summarized above.  Follow-up in clinic in 4 to 6 weeks or sooner if needed.  I have spent atleast 20 minutes non face to face with patient today. More than 50 % of the time was spent for preparing to see the patient ( e.g., review of test, records ), obtaining and to review and separately obtained history , ordering medications and test ,psychoeducation and supportive psychotherapy and care coordination,as well as documenting clinical information in electronic health record. This note was generated in part or whole with voice recognition software. Voice recognition is usually quite accurate but there are transcription errors that can and very often  do occur. I apologize for any typographical errors that were not detected and corrected.        Ursula Alert, MD 09/17/2019, 11:49 AM

## 2019-09-18 DIAGNOSIS — Z430 Encounter for attention to tracheostomy: Secondary | ICD-10-CM | POA: Diagnosis not present

## 2019-09-18 DIAGNOSIS — J4530 Mild persistent asthma, uncomplicated: Secondary | ICD-10-CM | POA: Diagnosis not present

## 2019-09-25 DIAGNOSIS — E1029 Type 1 diabetes mellitus with other diabetic kidney complication: Secondary | ICD-10-CM | POA: Diagnosis not present

## 2019-09-25 DIAGNOSIS — R809 Proteinuria, unspecified: Secondary | ICD-10-CM | POA: Diagnosis not present

## 2019-10-07 ENCOUNTER — Other Ambulatory Visit: Payer: Self-pay

## 2019-10-07 ENCOUNTER — Telehealth (HOSPITAL_COMMUNITY): Payer: Self-pay | Admitting: Clinical

## 2019-10-07 ENCOUNTER — Ambulatory Visit (HOSPITAL_COMMUNITY): Payer: PPO | Admitting: Clinical

## 2019-10-07 NOTE — Telephone Encounter (Signed)
The OPT therapist sent text to session x2 @ 1:00PM and 1:10PM , however, the patient did not respond missing their scheduled session

## 2019-10-13 ENCOUNTER — Other Ambulatory Visit: Payer: Self-pay | Admitting: Psychiatry

## 2019-10-13 DIAGNOSIS — F331 Major depressive disorder, recurrent, moderate: Secondary | ICD-10-CM

## 2019-10-18 DIAGNOSIS — R809 Proteinuria, unspecified: Secondary | ICD-10-CM | POA: Diagnosis not present

## 2019-10-18 DIAGNOSIS — E1029 Type 1 diabetes mellitus with other diabetic kidney complication: Secondary | ICD-10-CM | POA: Diagnosis not present

## 2019-10-19 DIAGNOSIS — J4530 Mild persistent asthma, uncomplicated: Secondary | ICD-10-CM | POA: Diagnosis not present

## 2019-10-29 ENCOUNTER — Encounter: Payer: Self-pay | Admitting: Psychiatry

## 2019-10-29 ENCOUNTER — Other Ambulatory Visit: Payer: Self-pay

## 2019-10-29 ENCOUNTER — Telehealth (INDEPENDENT_AMBULATORY_CARE_PROVIDER_SITE_OTHER): Payer: PPO | Admitting: Psychiatry

## 2019-10-29 DIAGNOSIS — F3342 Major depressive disorder, recurrent, in full remission: Secondary | ICD-10-CM | POA: Insufficient documentation

## 2019-10-29 DIAGNOSIS — F99 Mental disorder, not otherwise specified: Secondary | ICD-10-CM

## 2019-10-29 DIAGNOSIS — F411 Generalized anxiety disorder: Secondary | ICD-10-CM | POA: Diagnosis not present

## 2019-10-29 DIAGNOSIS — F5105 Insomnia due to other mental disorder: Secondary | ICD-10-CM | POA: Diagnosis not present

## 2019-10-29 NOTE — Progress Notes (Signed)
Provider Location : [facility] Patient Location : Home  Virtual Visit via Video Note  I connected with [name] on 10/29/19 at  1:30 PM EDT by a video enabled telemedicine application and verified that I am speaking with the correct person using two identifiers.   I discussed the limitations of evaluation and management by telemedicine and the availability of in person appointments. The patient expressed understanding and agreed to proceed.  I discussed the assessment and treatment plan with the patient. The patient was provided an opportunity to ask questions and all were answered. The patient agreed with the plan and demonstrated an understanding of the instructions.   The patient was advised to call back or seek an in-person evaluation if the symptoms worsen or if the condition fails to improve as anticipated.   [facility] MD OP Progress Note  10/29/2019 8:19 PM Markel Kurtenbach  MRN:  [id number]  Chief Complaint:  Chief Complaint    Follow-up     HPI: Audie Stayer is a 59 year old Caucasian male, married, lives in [place], has a history of depression, anxiety, multiple medical problems including history of tracheal stenosis status post multiple reintubation all during one hospitalization, post acute hypoxemic respiratory failure due to opioid overdose, history of CVA with right-sided hemiparesis currently resolved, diabetes, hypertension, NSTEMI, hypothyroidism, hyperlipidemia, chronic pain, MDD, GAD, insomnia was evaluated by telemedicine today.  Patient today reports he is currently doing well on the current medication regimen.  He reports he is enjoying his summer and has been doing a lot of outdoor activities.  He was able to go to the beach with his wife.  He also spent time [place] on the mountains with his dad.  He reports overall his mood is stable.  He is concerned about weight gain side effects of his medication and reports he may have gained weight.  Discussed with  him that his olanzapine can be tapered down gradually.  He is agreeable to that.  He reports sleep is good.  He denies any suicidality, homicidality or perceptual disturbances.  Patient reports he missed his last appointment with his therapist however is willing to give him a call to schedule an appointment soon.  He denies any other concerns today.  Visit Diagnosis:    ICD-10-CM   1. MDD (major depressive disorder), recurrent, in full remission ([place])  F33.42   2. GAD (generalized anxiety disorder)  F41.1   3. Insomnia due to other mental disorder  F51.05    F99     Past Psychiatric History: I have reviewed past psychiatric history from my progress note on 01/15/2019.  Past trials of fluvoxamine, Rexulti, Xanax, Abilify.  Patient recently completed [place] on September 02, 2019.  Had multiple [place] sessions. Patient also has a history of ECT however did not tolerate it well.  Patient with history of multiple inpatient mental health admission in 2020.  Past Medical History:  Past Medical History:  Diagnosis Date   Depression    Diabetes ([place])    Insulin Pump   Diabetes mellitus type I ([place])    Diabetes mellitus without complication ([place])    GERD (gastroesophageal reflux disease)    H/O laryngectomy    Heel bone fracture    Hyperlipidemia    Hypertension    Radicular pain of right lower extremity    Stroke [facility])    Suicide attempt ([place]) 2014   damaged larynx - tracheostomy   Thyroid disease     Past Surgical History:  Procedure  Laterality Date   COLONOSCOPY WITH PROPOFOL N/A 05/15/2018   Procedure: COLONOSCOPY WITH PROPOFOL;  Surgeon: [name], [name], MD;  Location: [facility] ENDOSCOPY;  Service: Gastroenterology;  Laterality: N/A;   ESOPHAGOGASTRODUODENOSCOPY N/A 05/15/2018   Procedure: ESOPHAGOGASTRODUODENOSCOPY (EGD);  Surgeon: [name], [name], MD;  Location: [facility] ENDOSCOPY;  Service: Gastroenterology;  Laterality: N/A;   FRACTURE SURGERY     Heel bone reconstruction  Left    HERNIA REPAIR  [id number]   Umbilical hernia repair    LARYNGECTOMY     NECK SURGERY     fusion   SPINE SURGERY     TRACHEOSTOMY  2014   from [place] attempt    Family Psychiatric History: I have reviewed family psychiatric history from my progress note on 01/15/2019  Family History:  Family History  Problem Relation Age of Onset   Osteoporosis Mother    Diabetes Mother    Hypertension Father    Mental illness Neg Hx     Social History: Reviewed social history from my progress note on 01/15/2019 Social History   Socioeconomic History   Marital status: Married    Spouse name: Not on file   Number of children: Not on file   Years of education: Not on file   Highest education level: Not on file  Occupational History   Not on file  Tobacco Use   Smoking status: Former Smoker    Packs/day: 0.00    Types: Cigarettes    Quit date: 04/09/2013    Years since quitting: 6.5   Smokeless tobacco: Never Used  Vaping Use   Vaping Use: Never used  Substance and Sexual Activity   Alcohol use: Yes    Alcohol/week: 2.0 standard drinks    Types: 2 Shots of liquor per week    Comment: rare   Drug use: No    Comment: Pt denied; UDS not available   Sexual activity: Yes    Partners: Female    Birth control/protection: Condom  Other Topics Concern   Not on file  Social History Narrative   Not on file   Social Determinants of Health   Financial Resource Strain:    Difficulty of Paying Living Expenses:   Food Insecurity:    Worried About Charity fundraiser in the Last Year:    Arboriculturist in the Last Year:   Transportation Needs:    Film/video editor (Medical):    Lack of Transportation (Non-Medical):   Physical Activity:    Days of Exercise per Week:    Minutes of Exercise per Session:   Stress:    Feeling of Stress :   Social Connections:    Frequency of Communication with Friends and Family:    Frequency of Social Gatherings with  Friends and Family:    Attends Religious Services:    Active Member of Clubs or Organizations:    Attends Archivist Meetings:    Marital Status:     Allergies:  Allergies  Allergen Reactions   Buspar [Buspirone]     Makes the patient "flip out"   Depakote [Valproic Acid]     Causes excessive drowsiness   Clopidogrel Rash   Gabapentin Itching and Rash    Metabolic Disorder Labs: Lab Results  Component Value Date   HGBA1C 7.8 (H) 01/01/2019   MPG 177.16 01/01/2019   MPG 174.29 12/23/2018   No results found for: PROLACTIN Lab Results  Component Value Date   CHOL 161 09/15/2016   TRIG  190 (H) 09/15/2016   HDL 41 09/15/2016   CHOLHDL 3.9 09/15/2016   VLDL 38 09/15/2016   LDLCALC 82 09/15/2016   Lab Results  Component Value Date   TSH 1.952 09/15/2016    Therapeutic Level Labs: No results found for: LITHIUM Lab Results  Component Value Date   VALPROATE 19 (L) 09/12/2016   No components found for:  CBMZ  Current Medications: Current Outpatient Medications  Medication Sig Dispense Refill   albuterol (ACCUNEB) 1.25 MG/3ML nebulizer solution SMARTSIG:3 Milliliter(s) Via Nebulizer Every 6 Hours PRN     amLODipine (NORVASC) 5 MG tablet Take 1 tablet (5 mg total) by mouth daily. 30 tablet 1   aspirin EC 81 MG tablet Take 1 tablet (81 mg total) by mouth daily. 30 tablet 1   buPROPion (WELLBUTRIN XL) 150 MG 24 hr tablet TAKE 1 TABLET(150 MG) BY MOUTH DAILY 90 tablet 0   ciprofloxacin (CIPRO) 250 MG tablet Take 250 mg by mouth 2 (two) times daily.     citalopram (CELEXA) 40 MG tablet TAKE 1 TABLET(40 MG) BY MOUTH DAILY 90 tablet 0   Continuous Blood Gluc Receiver (FREESTYLE LIBRE 14 DAY READER) DEVI FreeStyle Libre 14 Day Reader     Continuous Blood Gluc Sensor (FREESTYLE LIBRE 14 DAY SENSOR) MISC FreeStyle Libre 14 Day Sensor kit     empagliflozin (JARDIANCE) 25 MG TABS tablet Take 25 mg by mouth daily. 30 tablet 1   glycopyrrolate (ROBINUL) 1  MG tablet Take by mouth.     glycopyrrolate (ROBINUL) 1 MG tablet      hydrochlorothiazide (HYDRODIURIL) 25 MG tablet hydrochlorothiazide 25 mg tablet     insulin aspart (NOVOLOG) 100 UNIT/ML injection INJECT UP TO 120 UNITS UNDER THE SKIN AS DIRECTED DAILY VIA INSULIN PUMP     insulin aspart (NOVOLOG) 100 UNIT/ML injection INJECT UP TO 120 UNITS UNDER THE SKIN UTD D VIA INSULIN PUMP     levofloxacin (LEVAQUIN) 750 MG tablet Take 750 mg by mouth daily.     levothyroxine (SYNTHROID) 75 MCG tablet TAKE 1 TABLET BY MOUTH DAILY AT 6 AM 90 tablet 0   losartan-hydrochlorothiazide (HYZAAR) 100-25 MG tablet TAKE 1 TABLET BY MOUTH EVERY DAY     OLANZapine (ZYPREXA) 5 MG tablet TAKE 1 TABLET(5 MG) BY MOUTH AT BEDTIME 90 tablet 0   pantoprazole (PROTONIX) 40 MG tablet      pantoprazole (PROTONIX) 40 MG tablet TAKE 1 TABLET(40 MG) BY MOUTH TWICE DAILY     rosuvastatin (CRESTOR) 20 MG tablet Take 20 mg by mouth at bedtime.     rosuvastatin (CRESTOR) 20 MG tablet Take by mouth.     simvastatin (ZOCOR) 20 MG tablet TAKE 1 TABLET(20 MG) BY MOUTH EVERY NIGHT     tobramycin, PF, (TOBI) 300 MG/5ML nebulizer solution      traMADol (ULTRAM) 50 MG tablet Take 100 mg by mouth every 6 (six) hours as needed.     traZODone (DESYREL) 100 MG tablet Take 0.5-1 tablets (50-100 mg total) by mouth at bedtime as needed for sleep. 180 tablet 0   No current facility-administered medications for this visit.     Musculoskeletal: Strength & Muscle Tone: UTA Gait & Station: normal Patient leans: N/A  Psychiatric Specialty Exam: Review of Systems  Psychiatric/Behavioral: Negative for agitation, behavioral problems, confusion, decreased concentration, dysphoric mood, hallucinations, self-injury, sleep disturbance and suicidal ideas. The patient is not nervous/anxious and is not hyperactive.   All other systems reviewed and are negative.   There were no vitals  taken for this visit.There is no height or weight  on file to calculate BMI.  General Appearance: Casual  Eye Contact:  Good  Speech:  Slow Has hx of tracheostomy  Volume:  Normal  Mood:  Euthymic  Affect:  Congruent  Thought Process:  Goal Directed and Descriptions of Associations: Intact  Orientation:  Full (Time, Place, and Person)  Thought Content: Logical   Suicidal Thoughts:  No  Homicidal Thoughts:  No  Memory:  Immediate;   Fair Recent;   Fair Remote;   Fair  Judgement:  Fair  Insight:  Fair  Psychomotor Activity:  Normal  Concentration:  Concentration: Fair and Attention Span: Fair  Recall:  [employer] of Knowledge: Fair  Language: Fair  Akathisia:  No  Handed:  Right  AIMS (if indicated): UTA  Assets:  Communication Skills Desire for Improvement Housing Social Support  ADL's:  Intact  Cognition: WNL  Sleep:  Fair   Screenings: AUDIT     Admission (Discharged) from 01/22/2019 in [facility] Admission (Discharged) from 12/31/2018 in [facility] Admission (Discharged) from 09/14/2016 in [facility]  Alcohol Use Disorder Identification Test Final Score (AUDIT) 0 0 1    ECT-MADRS     Admission (Discharged) from 01/22/2019 in [facility] Total Score 27    Mini-Mental     Admission (Discharged) from 01/22/2019 in [facility]  Total Score (max 30 points ) 30    PHQ2-9     Video Visit from 09/17/2019 in [facility] Office Visit from 06/11/2019 in [facility] Office Visit from 03/13/2019 in [facility] Visit from 02/20/2019 in [facility] Office Visit from 02/10/2019 in [facility]  PHQ-2 Total Score '1 1 4 6 3  ' PHQ-9 Total Score 3 -- '16 21 13       ' Assessment and Plan: [name] is a 59 year old Caucasian male, married, on disability,  has a history of multiple medical problems, MDD, GAD was evaluated by telemedicine today.  Patient is biologically predisposed given his multiple health issues.  Patient with psychosocial stressors of the pandemic, his own health problems.  Patient is currently stable.  Plan as noted below.  Plan MDD in remission Celexa 40 mg p.o. daily Trazodone 50 to 100 mg p.o. nightly as needed at reduced dosage. Discussed with patient to cut the Zyprexa into half-2.5 mg p.o. nightly at least couple of times a week and if he tolerates it well then he can start taking it every night until he returns for his next visit. Wellbutrin XL 150 mg p.o. daily  GAD-stable Continue CBT with Mr. [name] Celexa as prescribed  Insomnia-stable Trazodone 50 to 100 mg p.o. nightly as needed  Follow-up in clinic in 8 weeks or sooner if needed.  I have spent atleast 20 minutes non face to face with patient today. More than 50 % of the time was spent for preparing to see the patient ( e.g., review of test, records ),  ordering medications and test ,psychoeducation and supportive psychotherapy and care coordination,as well as documenting clinical information in electronic health record. This note was generated in part or whole with voice recognition software. Voice recognition is usually quite accurate but there are transcription errors that can and very often do occur. I apologize for any typographical errors that were not detected and corrected.  Ursula Alert, MD 10/29/2019, 8:19 PM

## 2019-11-03 DIAGNOSIS — Z430 Encounter for attention to tracheostomy: Secondary | ICD-10-CM | POA: Diagnosis not present

## 2019-11-07 DIAGNOSIS — R809 Proteinuria, unspecified: Secondary | ICD-10-CM | POA: Diagnosis not present

## 2019-11-07 DIAGNOSIS — E1029 Type 1 diabetes mellitus with other diabetic kidney complication: Secondary | ICD-10-CM | POA: Diagnosis not present

## 2019-11-18 DIAGNOSIS — J4530 Mild persistent asthma, uncomplicated: Secondary | ICD-10-CM | POA: Diagnosis not present

## 2019-11-19 DIAGNOSIS — E1029 Type 1 diabetes mellitus with other diabetic kidney complication: Secondary | ICD-10-CM | POA: Diagnosis not present

## 2019-11-19 DIAGNOSIS — R809 Proteinuria, unspecified: Secondary | ICD-10-CM | POA: Diagnosis not present

## 2019-11-20 DIAGNOSIS — L600 Ingrowing nail: Secondary | ICD-10-CM | POA: Diagnosis not present

## 2019-11-20 DIAGNOSIS — M79674 Pain in right toe(s): Secondary | ICD-10-CM | POA: Diagnosis not present

## 2019-11-20 DIAGNOSIS — B351 Tinea unguium: Secondary | ICD-10-CM | POA: Diagnosis not present

## 2019-11-20 DIAGNOSIS — M79675 Pain in left toe(s): Secondary | ICD-10-CM | POA: Diagnosis not present

## 2019-11-20 DIAGNOSIS — E109 Type 1 diabetes mellitus without complications: Secondary | ICD-10-CM | POA: Diagnosis not present

## 2019-11-30 IMAGING — DX RIGHT ANKLE - 2 VIEW
3 series · 3 of 3 positions shown · non-contrast
Comparison: None.
COMPARISON: None.

Addendum:
CLINICAL DATA: Fell yesterday and injured right foot and ankle.

EXAM:
RIGHT ANKLE - 2 VIEW

[ankle ap]
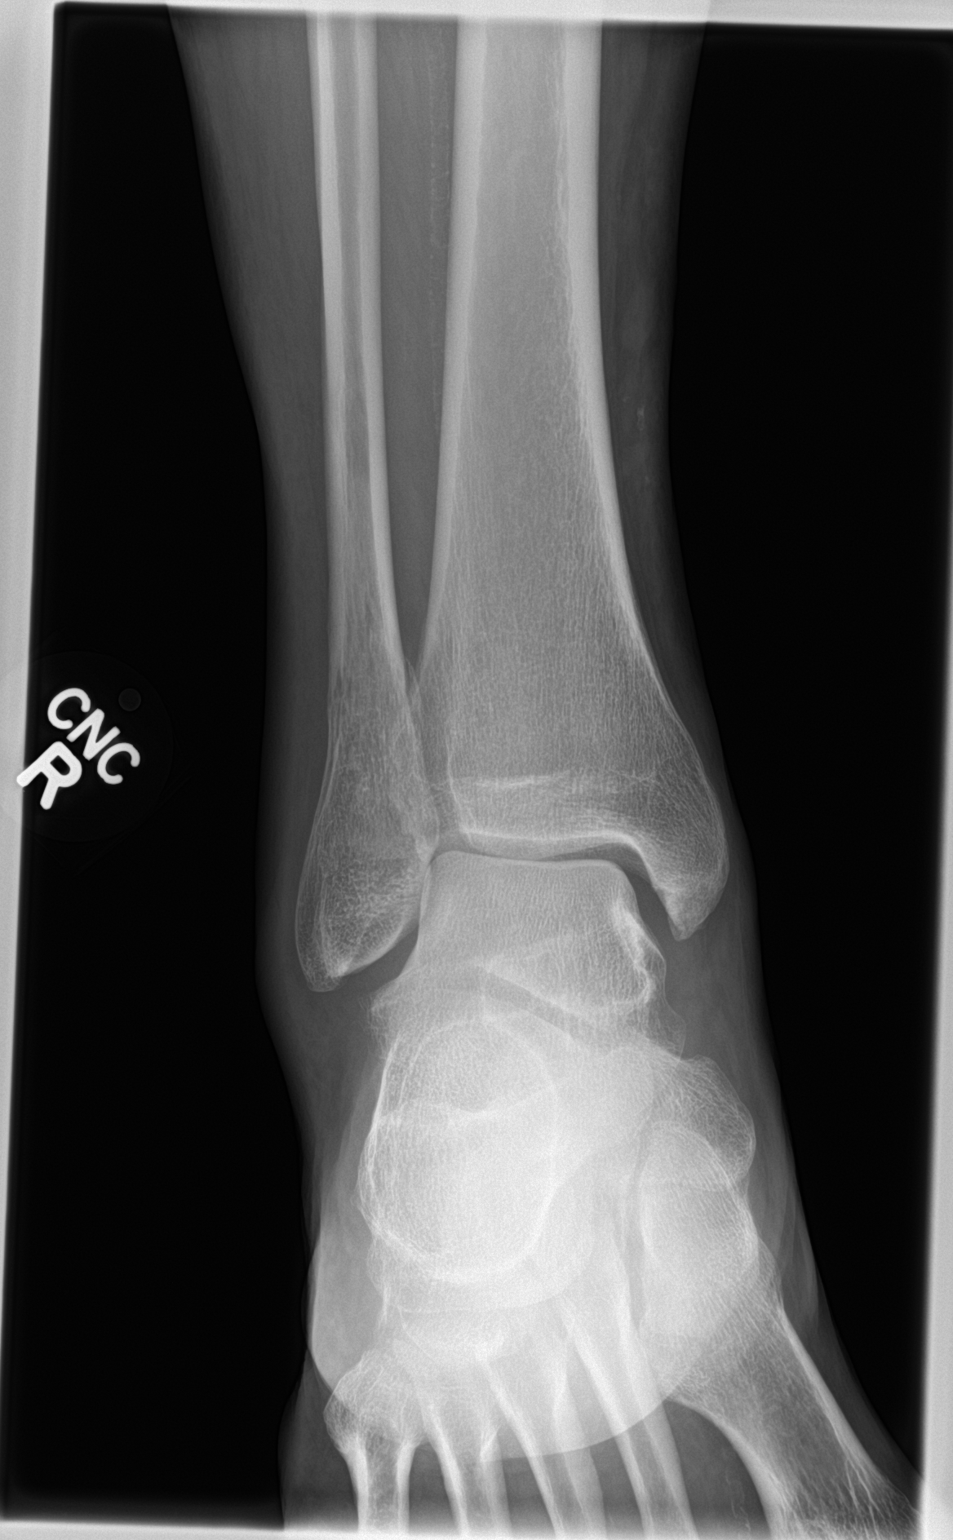

[ankle lat]
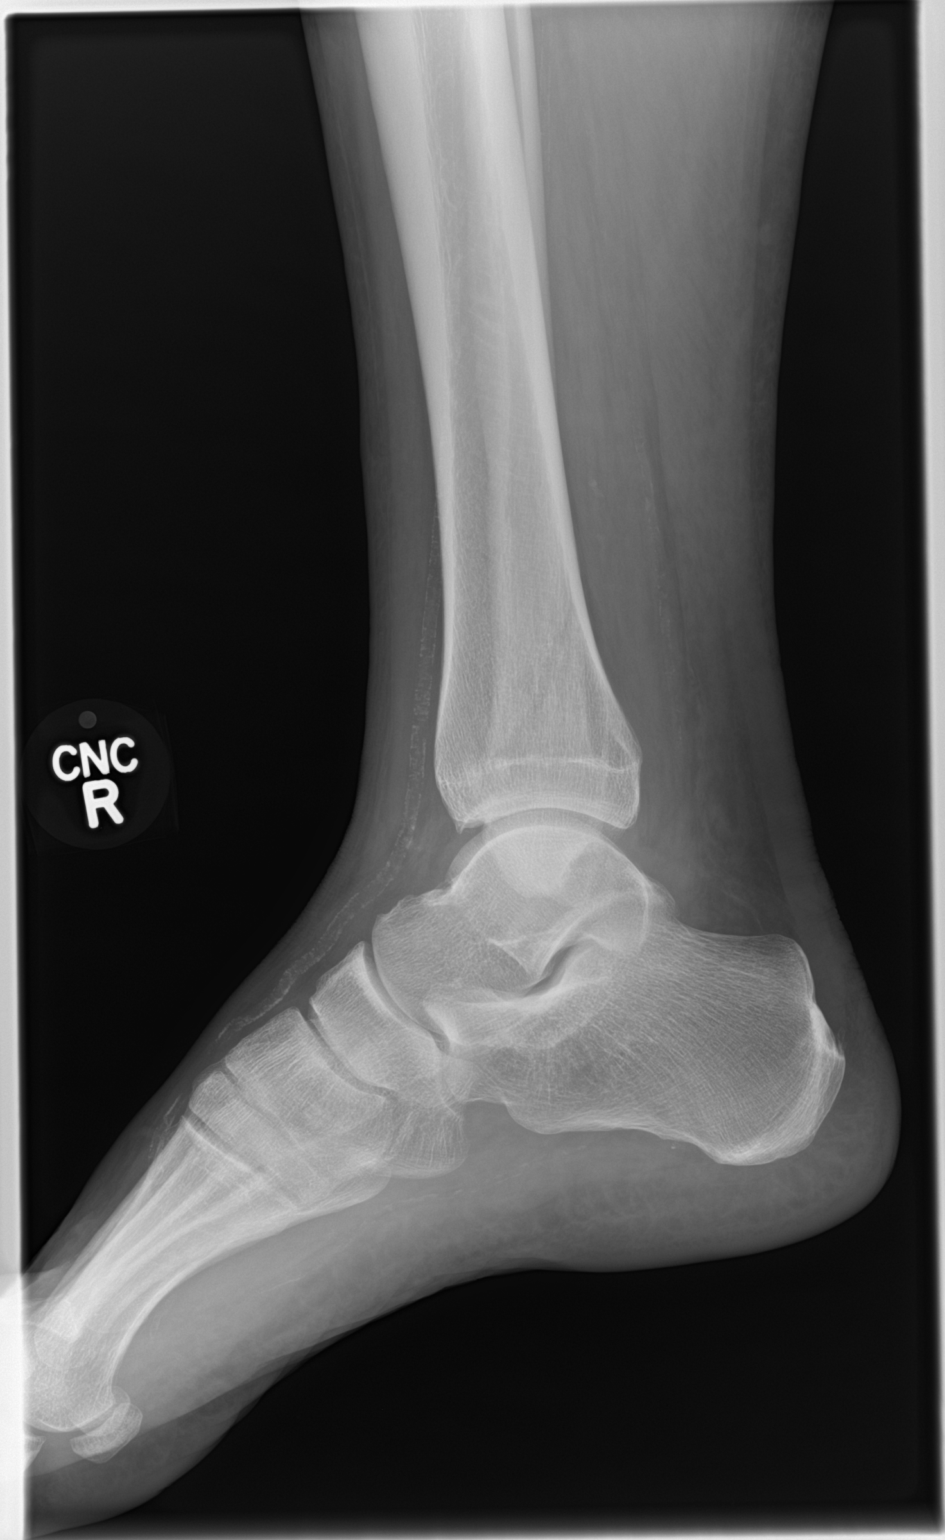

[ankle obl]
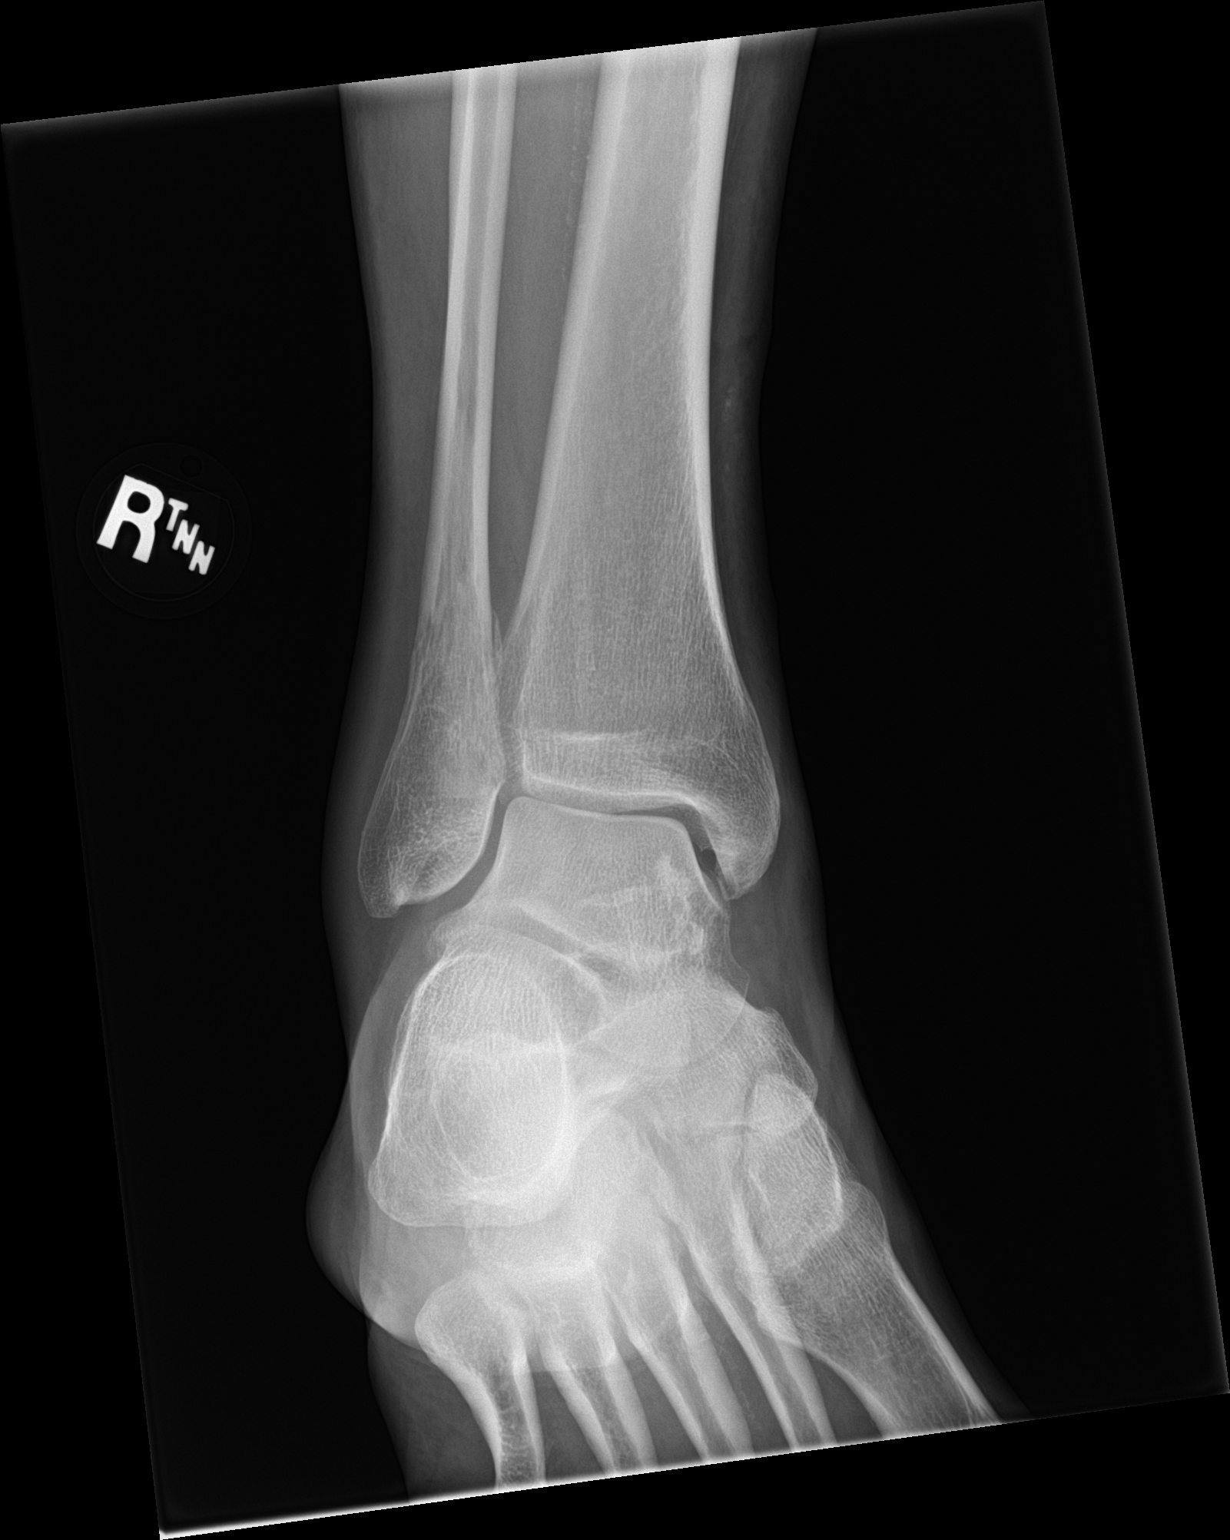

[3 of 3 positions shown; findings below may reference images not displayed]

FINDINGS: Findings suspicious for a nondisplaced oblique coursing distal
fibular fracture. Oblique/mortise view may be helpful for further
evaluation. No fracture of the tibia or talus is identified. The mid
and hindfoot bony structures are intact. Age advanced vascular
calcifications.
IMPRESSION: Suspect nondisplaced distal fibular fracture. Recommend standard
mortise/oblique film.

ADDENDUM:
Oblique/mortise view of the ankle confirms suspicious findings for
an oblique nondisplaced distal fibular shaft fracture.

*** End of Addendum ***
FINDINGS: Findings suspicious for a nondisplaced oblique coursing distal
fibular fracture. Oblique/mortise view may be helpful for further
evaluation. No fracture of the tibia or talus is identified. The mid
and hindfoot bony structures are intact. Age advanced vascular
calcifications.
IMPRESSION: Suspect nondisplaced distal fibular fracture. Recommend standard
mortise/oblique film.

## 2019-12-04 DIAGNOSIS — Z430 Encounter for attention to tracheostomy: Secondary | ICD-10-CM | POA: Diagnosis not present

## 2019-12-06 ENCOUNTER — Other Ambulatory Visit: Payer: Self-pay | Admitting: Psychiatry

## 2019-12-06 DIAGNOSIS — F411 Generalized anxiety disorder: Secondary | ICD-10-CM

## 2019-12-06 DIAGNOSIS — F3342 Major depressive disorder, recurrent, in full remission: Secondary | ICD-10-CM

## 2019-12-09 DIAGNOSIS — E1069 Type 1 diabetes mellitus with other specified complication: Secondary | ICD-10-CM | POA: Diagnosis not present

## 2019-12-09 DIAGNOSIS — R809 Proteinuria, unspecified: Secondary | ICD-10-CM | POA: Diagnosis not present

## 2019-12-09 DIAGNOSIS — E1029 Type 1 diabetes mellitus with other diabetic kidney complication: Secondary | ICD-10-CM | POA: Diagnosis not present

## 2019-12-09 DIAGNOSIS — E1159 Type 2 diabetes mellitus with other circulatory complications: Secondary | ICD-10-CM | POA: Diagnosis not present

## 2019-12-09 DIAGNOSIS — E785 Hyperlipidemia, unspecified: Secondary | ICD-10-CM | POA: Diagnosis not present

## 2019-12-09 DIAGNOSIS — I152 Hypertension secondary to endocrine disorders: Secondary | ICD-10-CM | POA: Diagnosis not present

## 2019-12-11 DIAGNOSIS — F17211 Nicotine dependence, cigarettes, in remission: Secondary | ICD-10-CM | POA: Diagnosis not present

## 2019-12-11 DIAGNOSIS — J209 Acute bronchitis, unspecified: Secondary | ICD-10-CM | POA: Diagnosis not present

## 2019-12-12 ENCOUNTER — Other Ambulatory Visit: Payer: Self-pay | Admitting: Psychiatry

## 2019-12-12 DIAGNOSIS — F3342 Major depressive disorder, recurrent, in full remission: Secondary | ICD-10-CM

## 2019-12-12 DIAGNOSIS — F411 Generalized anxiety disorder: Secondary | ICD-10-CM

## 2019-12-18 DIAGNOSIS — R809 Proteinuria, unspecified: Secondary | ICD-10-CM | POA: Diagnosis not present

## 2019-12-18 DIAGNOSIS — E1029 Type 1 diabetes mellitus with other diabetic kidney complication: Secondary | ICD-10-CM | POA: Diagnosis not present

## 2019-12-24 DIAGNOSIS — Z430 Encounter for attention to tracheostomy: Secondary | ICD-10-CM | POA: Diagnosis not present

## 2019-12-28 ENCOUNTER — Other Ambulatory Visit: Payer: Self-pay | Admitting: Psychiatry

## 2019-12-28 DIAGNOSIS — F331 Major depressive disorder, recurrent, moderate: Secondary | ICD-10-CM

## 2019-12-30 ENCOUNTER — Telehealth (INDEPENDENT_AMBULATORY_CARE_PROVIDER_SITE_OTHER): Payer: PPO | Admitting: Psychiatry

## 2019-12-30 ENCOUNTER — Other Ambulatory Visit: Payer: Self-pay

## 2019-12-30 ENCOUNTER — Encounter: Payer: Self-pay | Admitting: Psychiatry

## 2019-12-30 DIAGNOSIS — F99 Mental disorder, not otherwise specified: Secondary | ICD-10-CM

## 2019-12-30 DIAGNOSIS — F3342 Major depressive disorder, recurrent, in full remission: Secondary | ICD-10-CM | POA: Diagnosis not present

## 2019-12-30 DIAGNOSIS — F5105 Insomnia due to other mental disorder: Secondary | ICD-10-CM | POA: Diagnosis not present

## 2019-12-30 DIAGNOSIS — F411 Generalized anxiety disorder: Secondary | ICD-10-CM

## 2019-12-30 MED ORDER — TRAZODONE HCL 100 MG PO TABS: 50.0000 mg | ORAL_TABLET | Freq: Every evening | ORAL | 0 refills | Status: DC | PRN

## 2019-12-30 NOTE — Progress Notes (Signed)
Provider Location : [facility] Patient Location : Home  Participants: Patient , Provider  Virtual Visit via Video Note  I connected with [name] on 12/30/19 at  2:30 PM EDT by a video enabled telemedicine application and verified that I am speaking with the correct person using two identifiers.   I discussed the limitations of evaluation and management by telemedicine and the availability of in person appointments. The patient expressed understanding and agreed to proceed.    I discussed the assessment and treatment plan with the patient. The patient was provided an opportunity to ask questions and all were answered. The patient agreed with the plan and demonstrated an understanding of the instructions.   The patient was advised to call back or seek an in-person evaluation if the symptoms worsen or if the condition fails to improve as anticipated.   [place] MD OP Progress Note  12/30/2019 4:51 PM [name]  MRN:  [id number]  Chief Complaint:  Chief Complaint    Follow-up     HPI: [name] is a 59 year old Caucasian male, married, lives in [place], has a history of depression, anxiety, multiple medical problems including history of tracheal stenosis status post multiple reintubation, altered during one hospitalization, post acute hypoxemic respiratory failure due to opiate overdose, history of CVA with right-sided hemiparesis currently resolved, diabetes, hypertension,NSTEMI, hypothyroidism, hyperlipidemia, chronic pain, MDD, GAD, insomnia was evaluated by telemedicine today.  Patient today reports he is currently doing well.  He is compliant on medications as prescribed.  He reports he continues to be very active.  He has been going to the gym several times a week.  He reports he did not taper down the Zyprexa as discussed last visit.  He reports he forgot all about it and continue the same dosage.  He however is willing to do so.  Patient reports sleep is good.   He does wake up sometimes however he is able to go back to sleep.  Patient denies any suicidality, homicidality or perceptual disturbances.  Patient denies any other concerns today.  Visit Diagnosis:    ICD-10-CM   1. MDD (major depressive disorder), recurrent, in full remission ([place])  F33.42   2. GAD (generalized anxiety disorder)  F41.1 traZODone (DESYREL) 100 MG tablet  3. Insomnia due to other mental disorder  F51.05    F99     Past Psychiatric History: I have reviewed past psychiatric history from my progress note on 01/15/2019.  Past trials of fluvoxamine, Rexulti, Xanax, Abilify.  Patient recently completed [place] on September 02, 2019.  Patient had multiple [place] sessions. Patient has a history of ECT however did not tolerate it well.  Patient with history of multiple inpatient mental health admissions in 2020.  Past Medical History:  Past Medical History:  Diagnosis Date  . Depression   . Diabetes (HCC)    Insulin Pump  . Diabetes mellitus type I ([place])   . Diabetes mellitus without complication ([place])   . GERD (gastroesophageal reflux disease)   . H/O laryngectomy   . Heel bone fracture   . Hyperlipidemia   . Hypertension   . Radicular pain of right lower extremity   . Stroke ([place])   . Suicide attempt [facility] 2014   damaged larynx - tracheostomy  . Thyroid disease     Past Surgical History:  Procedure Laterality Date  . COLONOSCOPY WITH PROPOFOL N/A 05/15/2018   Procedure: COLONOSCOPY WITH PROPOFOL;  Surgeon: [name], [name], MD;  Location: [facility] ENDOSCOPY;  Service: Gastroenterology;  Laterality: N/A;  . ESOPHAGOGASTRODUODENOSCOPY N/A 05/15/2018   Procedure: ESOPHAGOGASTRODUODENOSCOPY (EGD);  Surgeon: [name], [name], MD;  Location: [facility] ENDOSCOPY;  Service: Gastroenterology;  Laterality: N/A;  . FRACTURE SURGERY    . Heel bone reconstruction Left   . HERNIA REPAIR  [id number]   Umbilical hernia repair   . LARYNGECTOMY    . NECK SURGERY     fusion  . SPINE SURGERY    .  TRACHEOSTOMY  2014   from [place] attempt    Family Psychiatric History: I have reviewed family psychiatric history from my progress note on 01/15/2019  Family History:  Family History  Problem Relation Age of Onset  . Osteoporosis Mother   . Diabetes Mother   . Hypertension Father   . Mental illness Neg Hx     Social History: I have reviewed social history from my progress note on 01/15/2019 Social History   Socioeconomic History  . Marital status: Married    Spouse name: Not on file  . Number of children: Not on file  . Years of education: Not on file  . Highest education level: Not on file  Occupational History  . Not on file  Tobacco Use  . Smoking status: Former Smoker    Packs/day: 0.00    Types: Cigarettes    Quit date: 04/09/2013    Years since quitting: 6.7  . Smokeless tobacco: Never Used  Vaping Use  . Vaping Use: Never used  Substance and Sexual Activity  . Alcohol use: Yes    Alcohol/week: 2.0 standard drinks    Types: 2 Shots of liquor per week    Comment: rare  . Drug use: No    Comment: Pt denied; UDS not available  . Sexual activity: Yes    Partners: Female    Birth control/protection: Condom  Other Topics Concern  . Not on file  Social History Narrative  . Not on file   Social Determinants of Health   Financial Resource Strain:   . Difficulty of Paying Living Expenses: Not on file  Food Insecurity:   . Worried About Charity fundraiser in the Last Year: Not on file  . Ran Out of Food in the Last Year: Not on file  Transportation Needs:   . Lack of Transportation (Medical): Not on file  . Lack of Transportation (Non-Medical): Not on file  Physical Activity:   . Days of Exercise per Week: Not on file  . Minutes of Exercise per Session: Not on file  Stress:   . Feeling of Stress : Not on file  Social Connections:   . Frequency of Communication with Friends and Family: Not on file  . Frequency of Social Gatherings with Friends and Family: Not on  file  . Attends Religious Services: Not on file  . Active Member of Clubs or Organizations: Not on file  . Attends Archivist Meetings: Not on file  . Marital Status: Not on file    Allergies:  Allergies  Allergen Reactions  . Buspar [Buspirone]     Makes the patient "flip out"  . Depakote [Valproic Acid]     Causes excessive drowsiness  . Clopidogrel Rash  . Gabapentin Itching and Rash    Metabolic Disorder Labs: Lab Results  Component Value Date   HGBA1C 7.8 (H) 01/01/2019   MPG 177.16 01/01/2019   MPG 174.29 12/23/2018   No results found for: PROLACTIN Lab Results  Component Value Date   CHOL 161  09/15/2016   TRIG 190 (H) 09/15/2016   HDL 41 09/15/2016   CHOLHDL 3.9 09/15/2016   VLDL 38 09/15/2016   LDLCALC 82 09/15/2016   Lab Results  Component Value Date   TSH 1.952 09/15/2016    Therapeutic Level Labs: No results found for: LITHIUM Lab Results  Component Value Date   VALPROATE 19 (L) 09/12/2016   No components found for:  CBMZ  Current Medications: Current Outpatient Medications  Medication Sig Dispense Refill  . amLODipine (NORVASC) 5 MG tablet Take 1 tablet (5 mg total) by mouth daily. 30 tablet 1  . aspirin EC 81 MG tablet Take 1 tablet (81 mg total) by mouth daily. 30 tablet 1  . buPROPion (WELLBUTRIN XL) 150 MG 24 hr tablet TAKE 1 TABLET(150 MG) BY MOUTH DAILY 90 tablet 0  . ciprofloxacin (CIPRO) 250 MG tablet Take 250 mg by mouth 2 (two) times daily.    . citalopram (CELEXA) 40 MG tablet TAKE 1 TABLET(40 MG) BY MOUTH DAILY 90 tablet 0  . Continuous Blood Gluc Receiver (FREESTYLE LIBRE 14 DAY READER) DEVI FreeStyle Libre 14 Day Reader    . Continuous Blood Gluc Sensor (FREESTYLE LIBRE 14 DAY SENSOR) MISC FreeStyle Libre 14 Day Sensor kit    . empagliflozin (JARDIANCE) 25 MG TABS tablet Take 25 mg by mouth daily. 30 tablet 1  . glycopyrrolate (ROBINUL) 1 MG tablet Take by mouth.    . insulin aspart (NOVOLOG) 100 UNIT/ML injection INJECT  UP TO 120 UNITS UNDER THE SKIN AS DIRECTED DAILY VIA INSULIN PUMP    . levofloxacin (LEVAQUIN) 750 MG tablet Take 750 mg by mouth daily.    Marland Kitchen levothyroxine (SYNTHROID) 75 MCG tablet TAKE 1 TABLET BY MOUTH DAILY AT 6 AM 90 tablet 0  . losartan-hydrochlorothiazide (HYZAAR) 100-25 MG tablet TAKE 1 TABLET BY MOUTH EVERY DAY    . OLANZapine (ZYPREXA) 5 MG tablet TAKE 1 TABLET(5 MG) BY MOUTH AT BEDTIME 90 tablet 0  . pantoprazole (PROTONIX) 40 MG tablet TAKE 1 TABLET(40 MG) BY MOUTH TWICE DAILY    . rosuvastatin (CRESTOR) 20 MG tablet Take 20 mg by mouth at bedtime.    . simvastatin (ZOCOR) 20 MG tablet TAKE 1 TABLET(20 MG) BY MOUTH EVERY NIGHT    . tobramycin, PF, (TOBI) 300 MG/5ML nebulizer solution     . traZODone (DESYREL) 100 MG tablet Take 0.5-1 tablets (50-100 mg total) by mouth at bedtime as needed for sleep. 180 tablet 0  . albuterol (ACCUNEB) 1.25 MG/3ML nebulizer solution SMARTSIG:3 Milliliter(s) Via Nebulizer Every 6 Hours PRN (Patient not taking: Reported on 12/30/2019)    . glycopyrrolate (ROBINUL) 1 MG tablet  (Patient not taking: Reported on 12/30/2019)    . hydrochlorothiazide (HYDRODIURIL) 25 MG tablet hydrochlorothiazide 25 mg tablet (Patient not taking: Reported on 12/30/2019)    . insulin aspart (NOVOLOG) 100 UNIT/ML injection INJECT UP TO 120 UNITS UNDER THE SKIN UTD D VIA INSULIN PUMP (Patient not taking: Reported on 12/30/2019)    . pantoprazole (PROTONIX) 40 MG tablet  (Patient not taking: Reported on 12/30/2019)    . rosuvastatin (CRESTOR) 20 MG tablet Take by mouth. (Patient not taking: Reported on 12/30/2019)    . traMADol (ULTRAM) 50 MG tablet Take 100 mg by mouth every 6 (six) hours as needed. (Patient not taking: Reported on 12/30/2019)     No current facility-administered medications for this visit.     Musculoskeletal: Strength & Muscle Tone: UTA Gait & Station: normal Patient leans: N/A  Psychiatric Specialty Exam:  Review of Systems  Psychiatric/Behavioral: Negative  for agitation, behavioral problems, confusion, decreased concentration, dysphoric mood, hallucinations, self-injury, sleep disturbance and suicidal ideas. The patient is not nervous/anxious and is not hyperactive.   All other systems reviewed and are negative.   There were no vitals taken for this visit.There is no height or weight on file to calculate BMI.  General Appearance: Casual  Eye Contact:  Fair  Speech:  Slow S/P tracheostomy  Volume:  Normal  Mood:  Euthymic  Affect:  Congruent  Thought Process:  Goal Directed and Descriptions of Associations: Intact  Orientation:  Full (Time, Place, and Person)  Thought Content: Logical   Suicidal Thoughts:  No  Homicidal Thoughts:  No  Memory:  Immediate;   Fair Recent;   Fair Remote;   Fair  Judgement:  Fair  Insight:  Fair  Psychomotor Activity:  Normal  Concentration:  Concentration: Fair and Attention Span: Fair  Recall:  [employer] of Knowledge: Fair  Language: Fair  Akathisia:  No  Handed:  Right  AIMS (if indicated): UTA  Assets:  Communication Skills Desire for Improvement Housing Social Support  ADL's:  Intact  Cognition: WNL  Sleep:  Fair   Screenings: AUDIT     Admission (Discharged) from 01/22/2019 in [facility] Admission (Discharged) from 12/31/2018 in [facility] Admission (Discharged) from 09/14/2016 in [facility]  Alcohol Use Disorder Identification Test Final Score (AUDIT) 0 0 1    ECT-MADRS     Admission (Discharged) from 01/22/2019 in [facility] Total Score 27    Mini-Mental     Admission (Discharged) from 01/22/2019 in [facility]  Total Score (max 30 points ) 30    PHQ2-9     Video Visit from 09/17/2019 in [facility] Office Visit from 06/11/2019 in [facility] Office Visit from 03/13/2019 in [place] Visit from 02/20/2019 in [facility] Office Visit from 02/10/2019 in [facility]  PHQ-2 Total Score '1 1 4 6 3  ' PHQ-9 Total Score 3 -- '16 21 13       ' Assessment and Plan: Bayler Gehrig is a 59 year old Caucasian male, married, on disability, has a history of multiple medical problems, MDD, GAD was evaluated by telemedicine today.  Patient is biologically predisposed given his multiple health issues.  Patient with psychosocial stressors of the pandemic, his own health problems.  Patient is currently stable.  Plan as noted below.  Plan MDD in remission Celexa 40 mg p.o. daily Trazodone 50 to 100 mg p.o. nightly as needed at reduced dosage. Discussed with patient to taper off Zyprexa.  He will start taking Zyprexa half tablet-2.5 mg p.o. nightly 1-2 times a week for the next 2 weeks and then if he does well he can start taking 2.5 mg every night until he returns. Wellbutrin XL 150 mg p.o. daily  GAD-stable Continue CBT with Mr. [name] Celexa as prescribed  Insomnia-stable Trazodone 50 to 100 mg p.o. nightly as needed  Follow-up in clinic in 8 weeks or sooner if needed.  I have spent atleast 20 minutes face to face with patient today. More than 50 % of the time was spent for  ordering medications and test ,psychoeducation and supportive psychotherapy and care coordination,as well as documenting clinical information in electronic health record. This note was generated in part or whole with voice recognition software.  Voice recognition is usually quite accurate but there are transcription errors that can and very often do occur. I apologize for any typographical errors that were not detected and corrected.       Ursula Alert, MD 12/30/2019, 4:51 PM

## 2020-01-05 DIAGNOSIS — Z430 Encounter for attention to tracheostomy: Secondary | ICD-10-CM | POA: Diagnosis not present

## 2020-01-10 IMAGING — DX PORTABLE CHEST - 1 VIEW
1 series · 1 of 1 positions shown · non-contrast
Comparison: November 12, 2018

CLINICAL DATA: Cough

EXAM:
PORTABLE CHEST 1 VIEW

[chest ap]
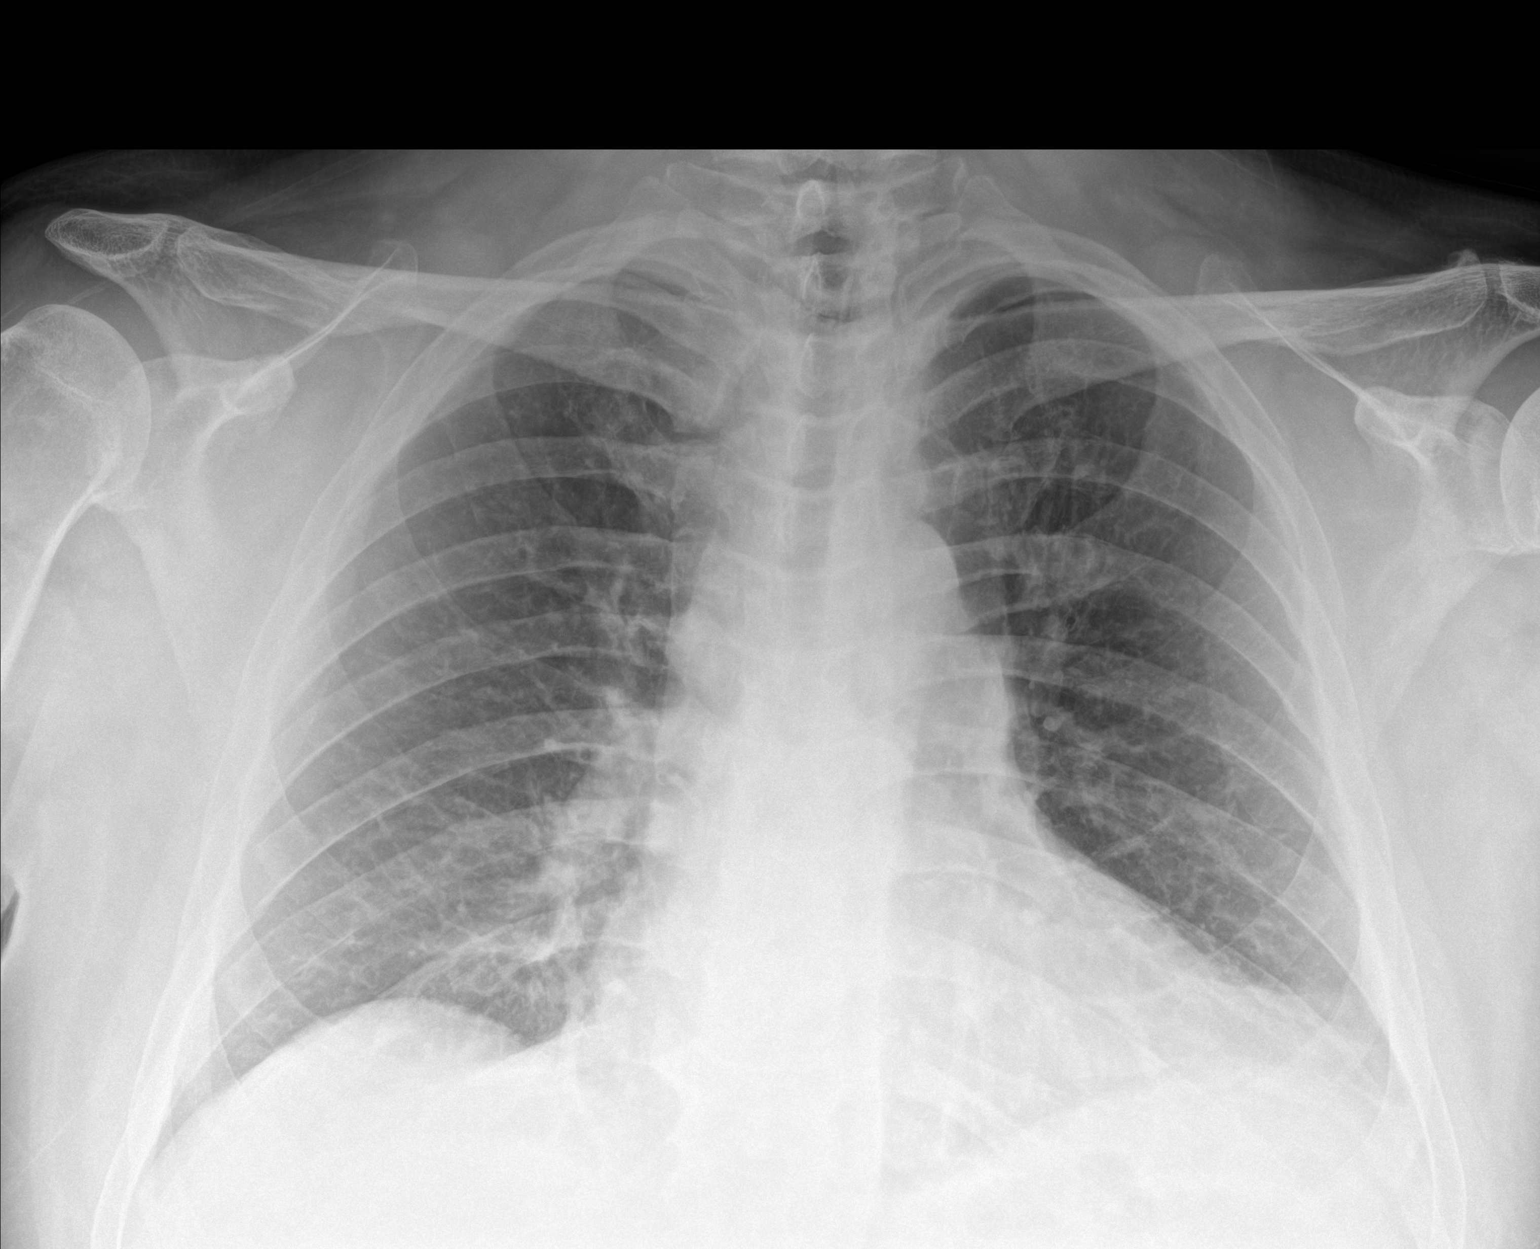

[1 of 1 positions shown; findings below may reference images not displayed]

FINDINGS: The heart size is enlarged. There is no pneumothorax. No large
pleural effusion. There is no displaced fracture. There are likely
aortic calcifications.
IMPRESSION: No active disease.

## 2020-01-19 DIAGNOSIS — E1029 Type 1 diabetes mellitus with other diabetic kidney complication: Secondary | ICD-10-CM | POA: Diagnosis not present

## 2020-01-19 DIAGNOSIS — R809 Proteinuria, unspecified: Secondary | ICD-10-CM | POA: Diagnosis not present

## 2020-02-03 ENCOUNTER — Telehealth: Payer: Self-pay

## 2020-02-03 DIAGNOSIS — F331 Major depressive disorder, recurrent, moderate: Secondary | ICD-10-CM

## 2020-02-03 MED ORDER — OLANZAPINE 7.5 MG PO TABS: 7.5000 mg | ORAL_TABLET | Freq: Every day | ORAL | 1 refills | Status: DC

## 2020-02-03 NOTE — Telephone Encounter (Signed)
Returned call to patient.  He reports he is likely going through another depressive episode and has been withdrawn, struggling with lack of motivation and sleep problems.  Denies suicidality.  He is interested in another session of [facility] if possible.  We will send a referral today.  Will increase Zyprexa to 7.5 mg at bedtime.  Crisis plan discussed with patient.

## 2020-02-03 NOTE — Telephone Encounter (Signed)
pt called left message that he need to discuss some issues he is having

## 2020-02-06 DIAGNOSIS — Z430 Encounter for attention to tracheostomy: Secondary | ICD-10-CM | POA: Diagnosis not present

## 2020-02-09 ENCOUNTER — Encounter: Payer: Self-pay | Admitting: Psychiatry

## 2020-02-18 DIAGNOSIS — R809 Proteinuria, unspecified: Secondary | ICD-10-CM | POA: Diagnosis not present

## 2020-02-18 DIAGNOSIS — E1029 Type 1 diabetes mellitus with other diabetic kidney complication: Secondary | ICD-10-CM | POA: Diagnosis not present

## 2020-02-19 DIAGNOSIS — R809 Proteinuria, unspecified: Secondary | ICD-10-CM | POA: Diagnosis not present

## 2020-02-19 DIAGNOSIS — E1029 Type 1 diabetes mellitus with other diabetic kidney complication: Secondary | ICD-10-CM | POA: Diagnosis not present

## 2020-02-23 ENCOUNTER — Encounter (HOSPITAL_COMMUNITY): Payer: PPO

## 2020-02-26 ENCOUNTER — Encounter: Payer: Self-pay | Admitting: Psychiatry

## 2020-02-26 ENCOUNTER — Other Ambulatory Visit: Payer: Self-pay

## 2020-02-26 ENCOUNTER — Telehealth (INDEPENDENT_AMBULATORY_CARE_PROVIDER_SITE_OTHER): Payer: PPO | Admitting: Psychiatry

## 2020-02-26 DIAGNOSIS — F332 Major depressive disorder, recurrent severe without psychotic features: Secondary | ICD-10-CM | POA: Diagnosis not present

## 2020-02-26 DIAGNOSIS — F5105 Insomnia due to other mental disorder: Secondary | ICD-10-CM

## 2020-02-26 DIAGNOSIS — F411 Generalized anxiety disorder: Secondary | ICD-10-CM

## 2020-02-26 DIAGNOSIS — F99 Mental disorder, not otherwise specified: Secondary | ICD-10-CM | POA: Diagnosis not present

## 2020-02-26 MED ORDER — OLANZAPINE 10 MG PO TABS: 10.0000 mg | ORAL_TABLET | Freq: Every day | ORAL | 1 refills | Status: DC

## 2020-02-26 MED ORDER — VENLAFAXINE HCL ER 37.5 MG PO CP24: 37.5000 mg | ORAL_CAPSULE | Freq: Every day | ORAL | 1 refills | Status: DC

## 2020-02-26 MED ORDER — CITALOPRAM HYDROBROMIDE 40 MG PO TABS: 20.0000 mg | ORAL_TABLET | Freq: Every day | ORAL | 0 refills | Status: DC

## 2020-02-26 NOTE — Progress Notes (Signed)
Virtual Visit via Video Note  I connected with [name] on 02/26/20 at  2:00 PM EDT by a video enabled telemedicine application and verified that I am speaking with the correct person using two identifiers.  Location Provider Location : [facility] Patient Location : Home  Participants: Patient , Provider    I discussed the limitations of evaluation and management by telemedicine and the availability of in person appointments. The patient expressed understanding and agreed to proceed.      I discussed the assessment and treatment plan with the patient. The patient was provided an opportunity to ask questions and all were answered. The patient agreed with the plan and demonstrated an understanding of the instructions.   The patient was advised to call back or seek an in-person evaluation if the symptoms worsen or if the condition fails to improve as anticipated.   [place] MD OP Progress Note  02/26/2020 2:29 PM [name]  MRN:  [id number]  Chief Complaint:  Chief Complaint    Follow-up     HPI: [name] is a 59 year old Caucasian male, married, lives in [place], has a history of MDD, GAD, insomnia, history of tracheal stenosis status post multiple reintubation, respiratory failure due to opioid overdose, history of CVA, right-sided hemiparesis currently resolved, diabetes, hypertension, NSTEMI, hypothyroidism, chronic pain was evaluated by telemedicine today.  Patient today reports he is currently struggling with depressive symptoms.  He reports he does not feel too good and struggles with lack of motivation, sadness, anhedonia, low energy and sleep problems.  He reports this has been getting worse since the past 1 week.  Patient reports he is currently approved for [place] therapy and will start on November 1.  He looks forward to that.  Patient reports he is compliant on all his medications.  Denies side effects.  Patient denies any suicidality, homicidality  or perceptual disturbances.  He does report psychosocial stressors of his wife being hospitalized for medical causes.  That does affect him.  He currently does not have any social support system since his parents are also out of town visiting family.  Patient however reports he can reach out to his therapist.  Provided him information-contact number for his therapist.  Patient denies any other concerns today.  Visit Diagnosis:    ICD-10-CM   1. Severe episode of recurrent major depressive disorder, without psychotic features (HCC)  F33.2 venlafaxine XR (EFFEXOR-XR) 37.5 MG 24 hr capsule    OLANZapine (ZYPREXA) 10 MG tablet  2. GAD (generalized anxiety disorder)  F41.1 citalopram (CELEXA) 40 MG tablet    venlafaxine XR (EFFEXOR-XR) 37.5 MG 24 hr capsule  3. Insomnia due to other mental disorder  F51.05    F99     Past Psychiatric History: I have reviewed past psychiatric history from my progress note on 01/15/2019.  Past trials of fluoxetine, Rexulti, Xanax, Abilify.  Patient recently completed [place] on September 02, 2019.  He had multiple TMS sessions in the past.  He also has a history of ECT however did not tolerate it well.  Patient with history of multiple inpatient mental health admissions in 2020.  Past Medical History:  Past Medical History:  Diagnosis Date  . Depression   . Diabetes (HCC)    Insulin Pump  . Diabetes mellitus type I ([place])   . Diabetes mellitus without complication ([place])   . GERD (gastroesophageal reflux disease)   . H/O laryngectomy   . Heel bone fracture   . Hyperlipidemia   .  Hypertension   . Radicular pain of right lower extremity   . Stroke ([place])   . Suicide attempt [facility]) 2014   damaged larynx - tracheostomy  . Thyroid disease     Past Surgical History:  Procedure Laterality Date  . COLONOSCOPY WITH PROPOFOL N/A 05/15/2018   Procedure: COLONOSCOPY WITH PROPOFOL;  Surgeon: [name], [name], MD;  Location: [facility] ENDOSCOPY;  Service: Gastroenterology;   Laterality: N/A;  . ESOPHAGOGASTRODUODENOSCOPY N/A 05/15/2018   Procedure: ESOPHAGOGASTRODUODENOSCOPY (EGD);  Surgeon: [name], [name], MD;  Location: [facility] ENDOSCOPY;  Service: Gastroenterology;  Laterality: N/A;  . FRACTURE SURGERY    . Heel bone reconstruction Left   . HERNIA REPAIR  [id number]   Umbilical hernia repair   . LARYNGECTOMY    . NECK SURGERY     fusion  . SPINE SURGERY    . TRACHEOSTOMY  2014   from [place] attempt    Family Psychiatric History: I have reviewed family psychiatric history from my progress note on 01/15/2019  Family History:  Family History  Problem Relation Age of Onset  . Osteoporosis Mother   . Diabetes Mother   . Hypertension Father   . Mental illness Neg Hx     Social History: Reviewed social history from my progress note on 01/15/2019 Social History   Socioeconomic History  . Marital status: Married    Spouse name: Not on file  . Number of children: Not on file  . Years of education: Not on file  . Highest education level: Not on file  Occupational History  . Not on file  Tobacco Use  . Smoking status: Former Smoker    Packs/day: 0.00    Types: Cigarettes    Quit date: 04/09/2013    Years since quitting: 6.8  . Smokeless tobacco: Never Used  Vaping Use  . Vaping Use: Never used  Substance and Sexual Activity  . Alcohol use: Yes    Alcohol/week: 2.0 standard drinks    Types: 2 Shots of liquor per week    Comment: rare  . Drug use: No    Comment: Pt denied; UDS not available  . Sexual activity: Yes    Partners: Female    Birth control/protection: Condom  Other Topics Concern  . Not on file  Social History Narrative  . Not on file   Social Determinants of Health   Financial Resource Strain:   . Difficulty of Paying Living Expenses: Not on file  Food Insecurity:   . Worried About Charity fundraiser in the Last Year: Not on file  . Ran Out of Food in the Last Year: Not on file  Transportation Needs:   . Lack of Transportation  (Medical): Not on file  . Lack of Transportation (Non-Medical): Not on file  Physical Activity:   . Days of Exercise per Week: Not on file  . Minutes of Exercise per Session: Not on file  Stress:   . Feeling of Stress : Not on file  Social Connections:   . Frequency of Communication with Friends and Family: Not on file  . Frequency of Social Gatherings with Friends and Family: Not on file  . Attends Religious Services: Not on file  . Active Member of Clubs or Organizations: Not on file  . Attends Archivist Meetings: Not on file  . Marital Status: Not on file    Allergies:  Allergies  Allergen Reactions  . Buspar [Buspirone]     Makes the patient "flip out"  .  Depakote [Valproic Acid]     Causes excessive drowsiness  . Clopidogrel Rash  . Gabapentin Itching and Rash    Metabolic Disorder Labs: Lab Results  Component Value Date   HGBA1C 7.8 (H) 01/01/2019   MPG 177.16 01/01/2019   MPG 174.29 12/23/2018   No results found for: PROLACTIN Lab Results  Component Value Date   CHOL 161 09/15/2016   TRIG 190 (H) 09/15/2016   HDL 41 09/15/2016   CHOLHDL 3.9 09/15/2016   VLDL 38 09/15/2016   LDLCALC 82 09/15/2016   Lab Results  Component Value Date   TSH 1.952 09/15/2016    Therapeutic Level Labs: No results found for: LITHIUM Lab Results  Component Value Date   VALPROATE 19 (L) 09/12/2016   No components found for:  CBMZ  Current Medications: Current Outpatient Medications  Medication Sig Dispense Refill  . albuterol (ACCUNEB) 1.25 MG/3ML nebulizer solution SMARTSIG:3 Milliliter(s) Via Nebulizer Every 6 Hours PRN (Patient not taking: Reported on 12/30/2019)    . amLODipine (NORVASC) 5 MG tablet Take 1 tablet (5 mg total) by mouth daily. 30 tablet 1  . aspirin EC 81 MG tablet Take 1 tablet (81 mg total) by mouth daily. 30 tablet 1  . buPROPion (WELLBUTRIN XL) 150 MG 24 hr tablet TAKE 1 TABLET(150 MG) BY MOUTH DAILY 90 tablet 0  . ciprofloxacin (CIPRO)  250 MG tablet Take 250 mg by mouth 2 (two) times daily.    . citalopram (CELEXA) 40 MG tablet Take 0.5 tablets (20 mg total) by mouth daily. Stop taking after 1 week 7 tablet 0  . Continuous Blood Gluc Receiver (FREESTYLE LIBRE 14 DAY READER) DEVI FreeStyle Libre 14 Day Reader    . Continuous Blood Gluc Sensor (FREESTYLE LIBRE 14 DAY SENSOR) MISC FreeStyle Libre 14 Day Sensor kit    . empagliflozin (JARDIANCE) 25 MG TABS tablet Take 25 mg by mouth daily. 30 tablet 1  . FLUCELVAX QUADRIVALENT 0.5 ML injection     . glycopyrrolate (ROBINUL) 1 MG tablet Take by mouth.    Marland Kitchen glycopyrrolate (ROBINUL) 1 MG tablet  (Patient not taking: Reported on 12/30/2019)    . hydrochlorothiazide (HYDRODIURIL) 25 MG tablet hydrochlorothiazide 25 mg tablet (Patient not taking: Reported on 12/30/2019)    . insulin aspart (NOVOLOG) 100 UNIT/ML injection INJECT UP TO 120 UNITS UNDER THE SKIN AS DIRECTED DAILY VIA INSULIN PUMP    . insulin aspart (NOVOLOG) 100 UNIT/ML injection INJECT UP TO 120 UNITS UNDER THE SKIN UTD D VIA INSULIN PUMP (Patient not taking: Reported on 12/30/2019)    . levofloxacin (LEVAQUIN) 750 MG tablet Take 750 mg by mouth daily.    Marland Kitchen levothyroxine (SYNTHROID) 75 MCG tablet TAKE 1 TABLET BY MOUTH DAILY AT 6 AM 90 tablet 0  . losartan-hydrochlorothiazide (HYZAAR) 100-25 MG tablet TAKE 1 TABLET BY MOUTH EVERY DAY    . OLANZapine (ZYPREXA) 10 MG tablet Take 1 tablet (10 mg total) by mouth at bedtime. 30 tablet 1  . pantoprazole (PROTONIX) 40 MG tablet  (Patient not taking: Reported on 12/30/2019)    . pantoprazole (PROTONIX) 40 MG tablet TAKE 1 TABLET(40 MG) BY MOUTH TWICE DAILY    . rosuvastatin (CRESTOR) 20 MG tablet Take 20 mg by mouth at bedtime.    . rosuvastatin (CRESTOR) 20 MG tablet Take by mouth. (Patient not taking: Reported on 12/30/2019)    . simvastatin (ZOCOR) 20 MG tablet TAKE 1 TABLET(20 MG) BY MOUTH EVERY NIGHT    . tobramycin, PF, (TOBI) 300 MG/5ML nebulizer  solution     . traMADol  (ULTRAM) 50 MG tablet Take 100 mg by mouth every 6 (six) hours as needed. (Patient not taking: Reported on 12/30/2019)    . traZODone (DESYREL) 100 MG tablet Take 0.5-1 tablets (50-100 mg total) by mouth at bedtime as needed for sleep. 180 tablet 0  . venlafaxine XR (EFFEXOR-XR) 37.5 MG 24 hr capsule Take 1 capsule (37.5 mg total) by mouth daily with breakfast. 30 capsule 1   No current facility-administered medications for this visit.     Musculoskeletal: Strength & Muscle Tone: UTA Gait & Station: Seated Patient leans: N/A  Psychiatric Specialty Exam: Review of Systems  Constitutional: Positive for fatigue.  Psychiatric/Behavioral: Positive for dysphoric mood and sleep disturbance.  All other systems reviewed and are negative.   There were no vitals taken for this visit.There is no height or weight on file to calculate BMI.  General Appearance: Casual  Eye Contact:  Fair  Speech:  Slow  Volume:  Decreased  Mood:  Depressed s/p tracheostomy  Affect:  Flat  Thought Process:  Goal Directed and Descriptions of Associations: Intact  Orientation:  Full (Time, Place, and Person)  Thought Content: Logical   Suicidal Thoughts:  No  Homicidal Thoughts:  No  Memory:  Immediate;   Fair Recent;   Fair Remote;   Fair  Judgement:  Fair  Insight:  Fair  Psychomotor Activity:  Normal  Concentration:  Concentration: Fair and Attention Span: Fair  Recall:  [employer] of Knowledge: Fair  Language: Fair  Akathisia:  No  Handed:  Right  AIMS (if indicated): UTA  Assets:  Housing Social Support  ADL's:  Intact  Cognition: WNL  Sleep:  Poor   Screenings: AUDIT     Admission (Discharged) from 01/22/2019 in [facility] Admission (Discharged) from 12/31/2018 in [facility] Admission (Discharged) from 09/14/2016 in [facility]  Alcohol Use Disorder Identification Test Final Score (AUDIT) 0 0 1    ECT-MADRS     Admission  (Discharged) from 01/22/2019 in [facility] Total Score 27    Mini-Mental     Admission (Discharged) from 01/22/2019 in [facility] [facility]  Total Score (max 30 points ) 30    PHQ2-9     Video Visit from 09/17/2019 in [facility] Office Visit from 06/11/2019 in [facility] Office Visit from 03/13/2019 in [facility] Visit from 02/20/2019 in [facility] Office Visit from 02/10/2019 in [facility]  PHQ-2 Total Score '1 1 4 6 3  ' PHQ-9 Total Score 3 -- '16 21 13       ' Assessment and Plan: [name] is a 59 year old Caucasian male, married, on disability, has a history of multiple medical problems, MDD, GAD was evaluated by telemedicine today.  Patient is biologically predisposed given his multiple health issues.  Patient with psychosocial stressors of the pandemic, his own health issues, his wife's medical problems.  Patient is currently struggling with depressive symptoms and will benefit from the following plan.  Plan as noted below.  Plan MDD severe, recurrent, no psychosis-unstable Taper off Celexa.  Advised patient to cut Celexa and 1/2 tablet for the next 1 week and stop taking it. Start venlafaxine extended release 37.5 mg p.o. daily with breakfast Increase Zyprexa to 10 mg p.o. nightly.  This will also help with the sleep. Continue Wellbutrin XL 150 mg p.o.  daily Trazodone 50 to 100 mg p.o. nightly as needed at reduced dosage.  Patient uses it only as needed.  Advised patient to take it every night if he is struggling with sleep.  GAD-stable Continue CBT with Mr. [name].  Patient advised to contact Mr. Eulas Post.   Insomnia-unstable Zyprexa will help. Trazodone 50 to 100 mg p.o. nightly as needed  Follow-up in clinic in 3 to 4 weeks or sooner if needed.  Patient however  will start [place] therapy on November 1.  He has been approved.  I have spent atleast 30 minutes face to face by video with patient today. More than 50 % of the time was spent for preparing to see the patient ( e.g., review of test, records ),  ordering medications and test ,psychoeducation and supportive psychotherapy and care coordination,as well as documenting clinical information in electronic health record. This note was generated in part or whole with voice recognition software. Voice recognition is usually quite accurate but there are transcription errors that can and very often do occur. I apologize for any typographical errors that were not detected and corrected.       Ursula Alert, MD 02/26/2020, 2:29 PM

## 2020-02-26 NOTE — Patient Instructions (Signed)
Venlafaxine extended-release capsules What is this medicine? VENLAFAXINE(VEN la fax een) is used to treat depression, anxiety and panic disorder. This medicine may be used for other purposes; ask your health care provider or pharmacist if you have questions. COMMON BRAND NAME(S): Effexor XR What should I tell my health care provider before I take this medicine? They need to know if you have any of these conditions:  bleeding disorders  glaucoma  heart disease  high blood pressure  high cholesterol  kidney disease  liver disease  low levels of sodium in the blood  mania or bipolar disorder  seizures  suicidal thoughts, plans, or attempt; a previous suicide attempt by you or a family  take medicines that treat or prevent blood clots  thyroid disease  an unusual or allergic reaction to venlafaxine, desvenlafaxine, other medicines, foods, dyes, or preservatives  pregnant or trying to get pregnant  breast-feeding How should I use this medicine? Take this medicine by mouth with a full glass of water. Follow the directions on the prescription label. Do not cut, crush, or chew this medicine. Take it with food. If needed, the capsule may be carefully opened and the entire contents sprinkled on a spoonful of cool applesauce. Swallow the applesauce/pellet mixture right away without chewing and follow with a glass of water to ensure complete swallowing of the pellets. Try to take your medicine at about the same time each day. Do not take your medicine more often than directed. Do not stop taking this medicine suddenly except upon the advice of your doctor. Stopping this medicine too quickly may cause serious side effects or your condition may worsen. A special MedGuide will be given to you by the pharmacist with each prescription and refill. Be sure to read this information carefully each time. Talk to your pediatrician regarding the use of this medicine in children. Special care may be  needed. Overdosage: If you think you have taken too much of this medicine contact a poison control center or emergency room at once. NOTE: This medicine is only for you. Do not share this medicine with others. What if I miss a dose? If you miss a dose, take it as soon as you can. If it is almost time for your next dose, take only that dose. Do not take double or extra doses. What may interact with this medicine? Do not take this medicine with any of the following medications:  certain medicines for fungal infections like fluconazole, itraconazole, ketoconazole, posaconazole, voriconazole  cisapride  desvenlafaxine  dronedarone  duloxetine  levomilnacipran  linezolid  MAOIs like Carbex, Eldepryl, Marplan, Nardil, and Parnate  methylene blue (injected into a vein)  milnacipran  pimozide  thioridazine This medicine may also interact with the following medications:  amphetamines  aspirin and aspirin-like medicines  certain medicines for depression, anxiety, or psychotic disturbances  certain medicines for migraine headaches like almotriptan, eletriptan, frovatriptan, naratriptan, rizatriptan, sumatriptan, zolmitriptan  certain medicines for sleep  certain medicines that treat or prevent blood clots like dalteparin, enoxaparin, warfarin  cimetidine  clozapine  diuretics  fentanyl  furazolidone  indinavir  isoniazid  lithium  metoprolol  NSAIDS, medicines for pain and inflammation, like ibuprofen or naproxen  other medicines that prolong the QT interval (cause an abnormal heart rhythm) like dofetilide, ziprasidone  procarbazine  rasagiline  supplements like St. John's wort, kava kava, valerian  tramadol  tryptophan This list may not describe all possible interactions. Give your health care provider a list of all the medicines,  herbs, non-prescription drugs, or dietary supplements you use. Also tell them if you smoke, drink alcohol, or use illegal  drugs. Some items may interact with your medicine. What should I watch for while using this medicine? Tell your doctor if your symptoms do not get better or if they get worse. Visit your doctor or health care professional for regular checks on your progress. Because it may take several weeks to see the full effects of this medicine, it is important to continue your treatment as prescribed by your doctor. Patients and their families should watch out for new or worsening thoughts of suicide or depression. Also watch out for sudden changes in feelings such as feeling anxious, agitated, panicky, irritable, hostile, aggressive, impulsive, severely restless, overly excited and hyperactive, or not being able to sleep. If this happens, especially at the beginning of treatment or after a change in dose, call your health care professional. This medicine can cause an increase in blood pressure. Check with your doctor for instructions on monitoring your blood pressure while taking this medicine. You may get drowsy or dizzy. Do not drive, use machinery, or do anything that needs mental alertness until you know how this medicine affects you. Do not stand or sit up quickly, especially if you are an older patient. This reduces the risk of dizzy or fainting spells. Alcohol may interfere with the effect of this medicine. Avoid alcoholic drinks. Your mouth may get dry. Chewing sugarless gum, sucking hard candy and drinking plenty of water will help. Contact your doctor if the problem does not go away or is severe. What side effects may I notice from receiving this medicine? Side effects that you should report to your doctor or health care professional as soon as possible:  allergic reactions like skin rash, itching or hives, swelling of the face, lips, or tongue  anxious  breathing problems  confusion  changes in vision  chest pain  confusion  elevated mood, decreased need for sleep, racing thoughts, impulsive  behavior  eye pain  fast, irregular heartbeat  feeling faint or lightheaded, falls  feeling agitated, angry, or irritable  hallucination, loss of contact with reality  high blood pressure  loss of balance or coordination  palpitations  redness, blistering, peeling or loosening of the skin, including inside the mouth  restlessness, pacing, inability to keep still  seizures  stiff muscles  suicidal thoughts or other mood changes  trouble passing urine or change in the amount of urine  trouble sleeping  unusual bleeding or bruising  unusually weak or tired  vomiting Side effects that usually do not require medical attention (report to your doctor or health care professional if they continue or are bothersome):  change in sex drive or performance  change in appetite or weight  constipation  dizziness  dry mouth  headache  increased sweating  nausea  tired This list may not describe all possible side effects. Call your doctor for medical advice about side effects. You may report side effects to FDA at 1-800-FDA-1088. Where should I keep my medicine? Keep out of the reach of children. Store at a controlled temperature between 20 and 25 degrees C (68 degrees and 77 degrees F), in a dry place. Throw away any unused medicine after the expiration date. NOTE: This sheet is a summary. It may not cover all possible information. If you have questions about this medicine, talk to your doctor, pharmacist, or health care provider.  2020 Elsevier/Gold Standard (2018-04-16 12:06:43)

## 2020-03-08 ENCOUNTER — Other Ambulatory Visit: Payer: Self-pay

## 2020-03-08 ENCOUNTER — Other Ambulatory Visit (HOSPITAL_COMMUNITY): Payer: PPO | Attending: Psychiatry

## 2020-03-08 DIAGNOSIS — F332 Major depressive disorder, recurrent severe without psychotic features: Secondary | ICD-10-CM | POA: Diagnosis not present

## 2020-03-08 NOTE — Progress Notes (Signed)
Patient reported to [facility] for Repetitive Transcranial Magnetic Stimulation treatment for severe episode of recurrent major depressive disorder, without psychotic features. Pt completed a PHQ-9 with a score of 20 (severe depression). Pt also completed a Beck's Depression Inventory with a score of 29 (moderate depression).  Prior to procedure, patient signed an informed consent agreement for [place] treatment. Patient presented with appropriate affect, level mood and denied any suicidal or homicidal ideations. Patient denies any other current symptoms and remains optimistic with continued [place] treatment. Patient reported no change in alcohol/substance use, caffeine consumption, sleep pattern or metal implant status since previous Tx. Patient reported to Tx session with pleasant affect. Pt responded well during the first and second rounds of Tx. Pt will do 15 sessions this round. Power was titrated to 120% for the duration of Tx and will remain there until patient gets adjusted. Patient reported no complaints or discomfort. Patient departed post-treatment with no concerns or complaints.

## 2020-03-09 ENCOUNTER — Other Ambulatory Visit (INDEPENDENT_AMBULATORY_CARE_PROVIDER_SITE_OTHER): Payer: PPO

## 2020-03-09 ENCOUNTER — Other Ambulatory Visit: Payer: Self-pay

## 2020-03-09 DIAGNOSIS — F332 Major depressive disorder, recurrent severe without psychotic features: Secondary | ICD-10-CM | POA: Diagnosis not present

## 2020-03-09 DIAGNOSIS — Z430 Encounter for attention to tracheostomy: Secondary | ICD-10-CM | POA: Diagnosis not present

## 2020-03-09 NOTE — Progress Notes (Signed)
Patient reported to [facility] for Repetitive Transcranial Magnetic Stimulation treatment for severe episode of recurrent major depressive disorder, without psychotic features. Patient presented with appropriate affect, level mood and denied any suicidal or homicidal ideations. Patient denies any other current symptoms and remains optimistic with continued [place] treatment. Patient reported no change in alcohol/substance use, caffeine consumption, sleep pattern or metal implant status since previous tx. Patient reported to tx session with pleasant affect.Pt watched TV.Powerwas titrated to120% for the duration of txand will remain there until patient gets adjusted. Patient reported no complaints or discomfort. Patientdeparted post-treatment with no concerns or complaints.

## 2020-03-10 ENCOUNTER — Other Ambulatory Visit: Payer: Self-pay

## 2020-03-10 ENCOUNTER — Other Ambulatory Visit (INDEPENDENT_AMBULATORY_CARE_PROVIDER_SITE_OTHER): Payer: PPO

## 2020-03-10 DIAGNOSIS — F332 Major depressive disorder, recurrent severe without psychotic features: Secondary | ICD-10-CM

## 2020-03-10 NOTE — Progress Notes (Signed)
Patient reported to [facility] for Repetitive Transcranial Magnetic Stimulation treatment for severe episode of recurrent major depressive disorder, without psychotic features. Patient presented with appropriate affect, level mood and denied any suicidal or homicidal ideations. Patient denies any other current symptoms and remains optimistic with continued [place] treatment. Patient reported no change in alcohol/substance use, caffeine consumption, sleep pattern or metal implant status since previous tx. Patient reported to tx session with pleasant affect.Pt watched TV.Powerwas titrated to120% for the duration of txand will remain there until patient gets adjusted. Patient reported no complaints or discomfort. Patientdeparted post-treatment with no concerns or complaints.

## 2020-03-11 ENCOUNTER — Other Ambulatory Visit (INDEPENDENT_AMBULATORY_CARE_PROVIDER_SITE_OTHER): Payer: PPO

## 2020-03-11 ENCOUNTER — Other Ambulatory Visit: Payer: Self-pay

## 2020-03-11 DIAGNOSIS — F332 Major depressive disorder, recurrent severe without psychotic features: Secondary | ICD-10-CM

## 2020-03-11 NOTE — Progress Notes (Signed)
Patient reported to [facility] for Repetitive Transcranial Magnetic Stimulation treatment for severe episode of recurrent major depressive disorder, without psychotic features. Patient presented with appropriate affect, level mood and denied any suicidal or homicidal ideations. Patient denies any other current symptoms and remains optimistic with continued [place] treatment. Patient reported no change in alcohol/substance use, caffeine consumption, sleep pattern or metal implant status since previous tx. Patient reported to tx session with pleasant affect.Pt watched TV.Powerwas titrated to120% for the duration of txand will remain there until patient gets adjusted. Patient reported no complaints or discomfort. Patientdeparted post-treatment with no concerns or complaints.

## 2020-03-12 ENCOUNTER — Other Ambulatory Visit (INDEPENDENT_AMBULATORY_CARE_PROVIDER_SITE_OTHER): Payer: PPO

## 2020-03-12 ENCOUNTER — Other Ambulatory Visit: Payer: Self-pay

## 2020-03-12 DIAGNOSIS — F332 Major depressive disorder, recurrent severe without psychotic features: Secondary | ICD-10-CM | POA: Diagnosis not present

## 2020-03-12 NOTE — Progress Notes (Signed)
Patient reported to [facility] for Repetitive Transcranial Magnetic Stimulation treatment for severe episode of recurrent major depressive disorder, without psychotic features. Patient presented with appropriate affect, level mood and denied any suicidal or homicidal ideations. Patient denies any other current symptoms and remains optimistic with continued [place] treatment. Patient reported no change in alcohol/substance use, caffeine consumption, sleep pattern or metal implant status since previous tx. Patient reported to tx session with pleasant affect.Pt watched TV.Powerwas titrated to120% for the duration of txand will remain there until patient gets adjusted. Patient reported no complaints or discomfort. Patientdeparted post-treatment with no concerns or complaints.

## 2020-03-15 ENCOUNTER — Other Ambulatory Visit (INDEPENDENT_AMBULATORY_CARE_PROVIDER_SITE_OTHER): Payer: PPO

## 2020-03-15 ENCOUNTER — Other Ambulatory Visit: Payer: Self-pay

## 2020-03-15 DIAGNOSIS — F332 Major depressive disorder, recurrent severe without psychotic features: Secondary | ICD-10-CM | POA: Diagnosis not present

## 2020-03-15 NOTE — Progress Notes (Signed)
Patient reported to [facility] for Repetitive Transcranial Magnetic Stimulation treatment for severe episode of recurrent major depressive disorder, without psychotic features. Patient presented with appropriate affect, level mood and denied any suicidal or homicidal ideations. Patient denies any other current symptoms and remains optimistic with continued [place] treatment. Patient reported no change in alcohol/substance use, caffeine consumption, sleep pattern or metal implant status since previous tx. Patient reported to tx session with pleasant affect.Pt watched TV.Powerwas titrated to120% for the duration of txand will remain there until patient gets adjusted. Patient reported no complaints or discomfort. Last week patient scored a 19 on the PHQ-9. Patient states he still feels depressed and has seen no improvement at this time. Patientdeparted post-treatment with no concerns or complaints.

## 2020-03-16 ENCOUNTER — Other Ambulatory Visit (INDEPENDENT_AMBULATORY_CARE_PROVIDER_SITE_OTHER): Payer: PPO

## 2020-03-16 ENCOUNTER — Other Ambulatory Visit: Payer: Self-pay

## 2020-03-16 ENCOUNTER — Other Ambulatory Visit: Payer: Self-pay | Admitting: Psychiatry

## 2020-03-16 DIAGNOSIS — F411 Generalized anxiety disorder: Secondary | ICD-10-CM

## 2020-03-16 DIAGNOSIS — F332 Major depressive disorder, recurrent severe without psychotic features: Secondary | ICD-10-CM | POA: Diagnosis not present

## 2020-03-16 DIAGNOSIS — F331 Major depressive disorder, recurrent, moderate: Secondary | ICD-10-CM

## 2020-03-16 NOTE — Progress Notes (Signed)
Patient reported to [facility] for Repetitive Transcranial Magnetic Stimulation treatment for severe episode of recurrent major depressive disorder, without psychotic features. Patient presented with appropriate affect, level mood and denied any suicidal or homicidal ideations. Patient denies any other current symptoms and remains optimistic with continued [place] treatment. Patient reported no change in alcohol/substance use, caffeine consumption, sleep pattern or metal implant status since previous tx. Patient reported to tx session with pleasant affect.Pt watched TV.Powerwas titrated to120% for the duration of txand will remain there until patient gets adjusted. Patient reported no complaints or discomfort. Patient states he still feels depressed and has seen no improvement at this time. Patientdeparted post-treatment with no concerns or complaints.

## 2020-03-17 ENCOUNTER — Other Ambulatory Visit: Payer: Self-pay | Admitting: Psychiatry

## 2020-03-17 ENCOUNTER — Other Ambulatory Visit: Payer: Self-pay

## 2020-03-17 ENCOUNTER — Other Ambulatory Visit (INDEPENDENT_AMBULATORY_CARE_PROVIDER_SITE_OTHER): Payer: PPO

## 2020-03-17 DIAGNOSIS — F332 Major depressive disorder, recurrent severe without psychotic features: Secondary | ICD-10-CM

## 2020-03-17 DIAGNOSIS — F411 Generalized anxiety disorder: Secondary | ICD-10-CM

## 2020-03-17 DIAGNOSIS — F3342 Major depressive disorder, recurrent, in full remission: Secondary | ICD-10-CM

## 2020-03-17 NOTE — Progress Notes (Signed)
Patient reported to [facility] for Repetitive Transcranial Magnetic Stimulation treatment for severe episode of recurrent major depressive disorder, without psychotic features. Patient presented with appropriate affect, level mood and denied any suicidal or homicidal ideations. Patient denies any other current symptoms and remains optimistic with continued [place] treatment. Patient reported no change in alcohol/substance use, caffeine consumption, sleep pattern or metal implant status since previous tx. Patient reported to tx session with pleasant affect.Pt watched TV.Powerwas titrated to120% for the duration of txand will remain there until patient gets adjusted. Patient reported no complaints or discomfort. Patient states he still feels depressed and has seen no improvement at this time.Patientdeparted post-treatment with no concerns or complaints.

## 2020-03-18 ENCOUNTER — Other Ambulatory Visit: Payer: Self-pay

## 2020-03-18 ENCOUNTER — Other Ambulatory Visit (INDEPENDENT_AMBULATORY_CARE_PROVIDER_SITE_OTHER): Payer: PPO

## 2020-03-18 DIAGNOSIS — F332 Major depressive disorder, recurrent severe without psychotic features: Secondary | ICD-10-CM | POA: Diagnosis not present

## 2020-03-18 DIAGNOSIS — Z79899 Other long term (current) drug therapy: Secondary | ICD-10-CM | POA: Diagnosis not present

## 2020-03-18 DIAGNOSIS — Z125 Encounter for screening for malignant neoplasm of prostate: Secondary | ICD-10-CM | POA: Diagnosis not present

## 2020-03-18 NOTE — Progress Notes (Signed)
Patient reported to [facility] for Repetitive Transcranial Magnetic Stimulation treatment for severe episode of recurrent major depressive disorder, without psychotic features. Patient presented with appropriate affect, level mood and denied any suicidal or homicidal ideations. Patient denies any other current symptoms and remains optimistic with continued [place] treatment. Patient reported no change in alcohol/substance use, caffeine consumption, sleep pattern or metal implant status since previous tx. Patient reported to tx session with pleasant affect.Pt watched TV.Powerwas titrated to120% for the duration of txand will remain there until patient gets adjusted. Patient reported no complaints or discomfort. Patient scored a 11 on the PHQ-9. Patient states he still feels depressed and has seen no improvement at this time. Patientdeparted post-treatment with no concerns or complaints.

## 2020-03-19 ENCOUNTER — Other Ambulatory Visit: Payer: Self-pay | Admitting: Psychiatry

## 2020-03-19 DIAGNOSIS — F411 Generalized anxiety disorder: Secondary | ICD-10-CM

## 2020-03-19 DIAGNOSIS — F3342 Major depressive disorder, recurrent, in full remission: Secondary | ICD-10-CM

## 2020-03-22 ENCOUNTER — Other Ambulatory Visit: Payer: Self-pay

## 2020-03-22 ENCOUNTER — Other Ambulatory Visit (INDEPENDENT_AMBULATORY_CARE_PROVIDER_SITE_OTHER): Payer: PPO

## 2020-03-22 DIAGNOSIS — F332 Major depressive disorder, recurrent severe without psychotic features: Secondary | ICD-10-CM

## 2020-03-22 NOTE — Progress Notes (Signed)
Patient reported to [facility] for Repetitive Transcranial Magnetic Stimulation treatment for severe episode of recurrent major depressive disorder, without psychotic features. Patient presented with appropriate affect, level mood and denied any suicidal or homicidal ideations. Patient denies any other current symptoms and remains optimistic with continued [place] treatment. Patient reported no change in alcohol/substance use, caffeine consumption, sleep pattern or metal implant status since previous tx. Patient reported to tx session with pleasant affect.Pt watched TV.Powerwas titrated to120% for the duration of txand will remain there until patient gets adjusted. Patient reported no complaints or discomfort. Patient states he still feels depressed and has seen no improvement at this time.Patientdeparted post-treatment with no concerns or complaints.

## 2020-03-23 ENCOUNTER — Other Ambulatory Visit (INDEPENDENT_AMBULATORY_CARE_PROVIDER_SITE_OTHER): Payer: PPO

## 2020-03-23 ENCOUNTER — Other Ambulatory Visit: Payer: Self-pay

## 2020-03-23 DIAGNOSIS — F332 Major depressive disorder, recurrent severe without psychotic features: Secondary | ICD-10-CM

## 2020-03-23 NOTE — Progress Notes (Signed)
Patient reported to [facility] for Repetitive Transcranial Magnetic Stimulation treatment for severe episode of recurrent major depressive disorder, without psychotic features. Patient presented with appropriate affect, level mood and denied any suicidal or homicidal ideations. Patient denies any other current symptoms and remains optimistic with continued [place] treatment. Patient reported no change in alcohol/substance use, caffeine consumption, sleep pattern or metal implant status since previous tx. Patient reported to tx session with pleasant affect.Pt watched TV.Powerwas titrated to120% for the duration of txand will remain there until patient gets adjusted. Patient reported no complaints or discomfort. Patient states he still feels depressed and has seen no improvement at this time.Patientdeparted post-treatment with no concerns or complaints.

## 2020-03-24 ENCOUNTER — Other Ambulatory Visit: Payer: Self-pay

## 2020-03-24 ENCOUNTER — Other Ambulatory Visit (INDEPENDENT_AMBULATORY_CARE_PROVIDER_SITE_OTHER): Payer: PPO

## 2020-03-24 ENCOUNTER — Other Ambulatory Visit: Payer: Self-pay | Admitting: Psychiatry

## 2020-03-24 DIAGNOSIS — F332 Major depressive disorder, recurrent severe without psychotic features: Secondary | ICD-10-CM

## 2020-03-24 NOTE — Progress Notes (Addendum)
Patient reported to [facility] for Repetitive Transcranial Magnetic Stimulation treatment for severe episode of recurrent major depressive disorder, without psychotic features. Patient presented with appropriate affect, level mood and denied any suicidal or homicidal ideations. Patient denies any other current symptoms and remains optimistic with continued [place] treatment. Patient reported no change in alcohol/substance use, caffeine consumption, sleep pattern or metal implant status since previous tx. Patient reported to tx session with pleasant affect.Pt watched TV.Powerwas titrated to120% for the duration of txand will remain there until patient gets adjusted. Patient reported no complaints or discomfort.Patient scored a 12 on the PHQ-9. Patient states he still feels depressed and has seen no improvement at this time.Patientdeparted post-treatment with no concerns or complaints.

## 2020-03-25 ENCOUNTER — Other Ambulatory Visit: Payer: Self-pay | Admitting: Psychiatry

## 2020-03-25 DIAGNOSIS — Z79899 Other long term (current) drug therapy: Secondary | ICD-10-CM | POA: Diagnosis not present

## 2020-03-25 DIAGNOSIS — I152 Hypertension secondary to endocrine disorders: Secondary | ICD-10-CM | POA: Diagnosis not present

## 2020-03-25 DIAGNOSIS — N1832 Chronic kidney disease, stage 3b: Secondary | ICD-10-CM | POA: Diagnosis not present

## 2020-03-25 DIAGNOSIS — Z930 Tracheostomy status: Secondary | ICD-10-CM | POA: Diagnosis not present

## 2020-03-25 DIAGNOSIS — E1159 Type 2 diabetes mellitus with other circulatory complications: Secondary | ICD-10-CM | POA: Diagnosis not present

## 2020-03-25 DIAGNOSIS — Z0000 Encounter for general adult medical examination without abnormal findings: Secondary | ICD-10-CM | POA: Diagnosis not present

## 2020-03-25 DIAGNOSIS — F331 Major depressive disorder, recurrent, moderate: Secondary | ICD-10-CM

## 2020-03-25 DIAGNOSIS — E1021 Type 1 diabetes mellitus with diabetic nephropathy: Secondary | ICD-10-CM | POA: Diagnosis not present

## 2020-03-26 ENCOUNTER — Encounter (HOSPITAL_COMMUNITY): Payer: PPO

## 2020-03-29 ENCOUNTER — Other Ambulatory Visit (INDEPENDENT_AMBULATORY_CARE_PROVIDER_SITE_OTHER): Payer: PPO

## 2020-03-29 ENCOUNTER — Other Ambulatory Visit: Payer: Self-pay

## 2020-03-29 DIAGNOSIS — F332 Major depressive disorder, recurrent severe without psychotic features: Secondary | ICD-10-CM | POA: Diagnosis not present

## 2020-03-29 NOTE — Progress Notes (Signed)
Patient reported to [facility] for Repetitive Transcranial Magnetic Stimulation treatment for severe episode of recurrent major depressive disorder, without psychotic features. Patient presented with appropriate affect, level mood and denied any suicidal or homicidal ideations. Patient denies any other current symptoms and remains optimistic with continued [place] treatment. Patient reported no change in alcohol/substance use, caffeine consumption, sleep pattern or metal implant status since previous tx. Patient reported to tx session with pleasant affect.Pt watched TV.Powerwas titrated to120% for the duration of txand will remain there until patient gets adjusted. Patient reported no complaints or discomfort. Patient states he still feels depressed and has seen no improvement at this time.Patientdeparted post-treatment with no concerns or complaints.

## 2020-03-30 ENCOUNTER — Telehealth: Payer: Self-pay

## 2020-03-30 ENCOUNTER — Other Ambulatory Visit: Payer: Self-pay

## 2020-03-30 ENCOUNTER — Telehealth (INDEPENDENT_AMBULATORY_CARE_PROVIDER_SITE_OTHER): Payer: PPO | Admitting: Psychiatry

## 2020-03-30 ENCOUNTER — Other Ambulatory Visit (INDEPENDENT_AMBULATORY_CARE_PROVIDER_SITE_OTHER): Payer: PPO

## 2020-03-30 DIAGNOSIS — R809 Proteinuria, unspecified: Secondary | ICD-10-CM | POA: Diagnosis not present

## 2020-03-30 DIAGNOSIS — E1029 Type 1 diabetes mellitus with other diabetic kidney complication: Secondary | ICD-10-CM | POA: Diagnosis not present

## 2020-03-30 DIAGNOSIS — F332 Major depressive disorder, recurrent severe without psychotic features: Secondary | ICD-10-CM | POA: Diagnosis not present

## 2020-03-30 DIAGNOSIS — Z5329 Procedure and treatment not carried out because of patient's decision for other reasons: Secondary | ICD-10-CM

## 2020-03-30 DIAGNOSIS — F411 Generalized anxiety disorder: Secondary | ICD-10-CM

## 2020-03-30 NOTE — Progress Notes (Signed)
No response to call or text or video invite  

## 2020-03-30 NOTE — Progress Notes (Signed)
Patient reported to [facility] for Repetitive Transcranial Magnetic Stimulation treatment for severe episode of recurrent major depressive disorder, without psychotic features. Patient presented with appropriate affect, level mood and denied any suicidal or homicidal ideations. Patient denies any other current symptoms and remains optimistic with continued [place] treatment. Patient reported no change in alcohol/substance use, caffeine consumption, sleep pattern or metal implant status since previous tx. Patient reported to tx session with pleasant affect.Pt watched TV.Powerwas titrated to120% for the duration of txand will remain there until patient gets adjusted. Patient reported no complaints or discomfort.Patient scored a 15on the PHQ-9. Patient states he still feels depressed and has seen no improvement at this time.Patientdeparted post-treatment with no concerns or complaints.

## 2020-03-30 NOTE — Telephone Encounter (Signed)
pt called states he r/s his appt but he needs enough medication to get to his next appt.

## 2020-03-31 MED ORDER — VENLAFAXINE HCL ER 37.5 MG PO CP24: 37.5000 mg | ORAL_CAPSULE | Freq: Every day | ORAL | 1 refills | Status: DC

## 2020-03-31 NOTE — Telephone Encounter (Signed)
I have sent Effexor to pharmacy.  He has refills available on everything else.

## 2020-04-05 ENCOUNTER — Other Ambulatory Visit: Payer: Self-pay

## 2020-04-05 ENCOUNTER — Other Ambulatory Visit (INDEPENDENT_AMBULATORY_CARE_PROVIDER_SITE_OTHER): Payer: PPO

## 2020-04-05 DIAGNOSIS — F332 Major depressive disorder, recurrent severe without psychotic features: Secondary | ICD-10-CM | POA: Diagnosis not present

## 2020-04-05 NOTE — Progress Notes (Signed)
Patient reported to [facility] for Repetitive Transcranial Magnetic Stimulation treatment for severe episode of recurrent major depressive disorder, without psychotic features. Patient presented with appropriate affect, level mood and denied any suicidal or homicidal ideations. Patient denies any other current symptoms and remains optimistic with continued [place] treatment. Patient reported no change in alcohol/substance use, caffeine consumption, sleep pattern or metal implant status since previous tx. Patient reported to tx session with pleasant affect.Pt watched TV.Powerwas titrated to120% for the duration of txand will remain there until patient gets adjusted. Patient reported no complaints or discomfort.Patient scored a 14 on the PHQ-9. Patient states he still feels depressed and has seen no improvement at this time.Patientdeparted post-treatment with no concerns or complaints.

## 2020-04-06 ENCOUNTER — Other Ambulatory Visit: Payer: Self-pay

## 2020-04-06 ENCOUNTER — Ambulatory Visit (INDEPENDENT_AMBULATORY_CARE_PROVIDER_SITE_OTHER): Payer: PPO | Admitting: Clinical

## 2020-04-06 DIAGNOSIS — F411 Generalized anxiety disorder: Secondary | ICD-10-CM | POA: Diagnosis not present

## 2020-04-06 DIAGNOSIS — F332 Major depressive disorder, recurrent severe without psychotic features: Secondary | ICD-10-CM | POA: Diagnosis not present
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
## 2020-04-07 DIAGNOSIS — Z87891 Personal history of nicotine dependence: Secondary | ICD-10-CM | POA: Diagnosis not present

## 2020-04-07 DIAGNOSIS — Z963 Presence of artificial larynx: Secondary | ICD-10-CM | POA: Diagnosis not present

## 2020-04-07 DIAGNOSIS — Z7982 Long term (current) use of aspirin: Secondary | ICD-10-CM | POA: Diagnosis not present

## 2020-04-15 DIAGNOSIS — Z9002 Acquired absence of larynx: Secondary | ICD-10-CM | POA: Diagnosis not present

## 2020-04-16 DIAGNOSIS — Z430 Encounter for attention to tracheostomy: Secondary | ICD-10-CM | POA: Diagnosis not present

## 2020-04-21 DIAGNOSIS — I152 Hypertension secondary to endocrine disorders: Secondary | ICD-10-CM | POA: Diagnosis not present

## 2020-04-21 DIAGNOSIS — E1069 Type 1 diabetes mellitus with other specified complication: Secondary | ICD-10-CM | POA: Diagnosis not present

## 2020-04-21 DIAGNOSIS — E1159 Type 2 diabetes mellitus with other circulatory complications: Secondary | ICD-10-CM | POA: Diagnosis not present

## 2020-04-21 DIAGNOSIS — R809 Proteinuria, unspecified: Secondary | ICD-10-CM | POA: Diagnosis not present

## 2020-04-21 DIAGNOSIS — E1029 Type 1 diabetes mellitus with other diabetic kidney complication: Secondary | ICD-10-CM | POA: Diagnosis not present

## 2020-04-21 DIAGNOSIS — J40 Bronchitis, not specified as acute or chronic: Secondary | ICD-10-CM | POA: Diagnosis not present

## 2020-04-21 DIAGNOSIS — E785 Hyperlipidemia, unspecified: Secondary | ICD-10-CM | POA: Diagnosis not present

## 2020-04-28 ENCOUNTER — Telehealth (HOSPITAL_COMMUNITY): Payer: Self-pay | Admitting: Psychiatry

## 2020-04-28 ENCOUNTER — Telehealth (INDEPENDENT_AMBULATORY_CARE_PROVIDER_SITE_OTHER): Payer: PPO | Admitting: Psychiatry

## 2020-04-28 ENCOUNTER — Encounter: Payer: Self-pay | Admitting: Psychiatry

## 2020-04-28 ENCOUNTER — Ambulatory Visit (HOSPITAL_COMMUNITY)
Admission: EM | Admit: 2020-04-28 | Discharge: 2020-04-28 | Disposition: A | Discharge status: Home / Self Care | Payer: PPO | Attending: Registered Nurse | Admitting: Registered Nurse

## 2020-04-28 ENCOUNTER — Other Ambulatory Visit: Payer: Self-pay

## 2020-04-28 DIAGNOSIS — F411 Generalized anxiety disorder: Secondary | ICD-10-CM

## 2020-04-28 DIAGNOSIS — F99 Mental disorder, not otherwise specified: Secondary | ICD-10-CM | POA: Diagnosis not present

## 2020-04-28 DIAGNOSIS — F332 Major depressive disorder, recurrent severe without psychotic features: Secondary | ICD-10-CM

## 2020-04-28 DIAGNOSIS — Z9189 Other specified personal risk factors, not elsewhere classified: Secondary | ICD-10-CM | POA: Diagnosis not present

## 2020-04-28 DIAGNOSIS — F339 Major depressive disorder, recurrent, unspecified: Secondary | ICD-10-CM | POA: Diagnosis not present

## 2020-04-28 DIAGNOSIS — F5105 Insomnia due to other mental disorder: Secondary | ICD-10-CM

## 2020-04-28 DIAGNOSIS — T1491XA Suicide attempt, initial encounter: Secondary | ICD-10-CM | POA: Diagnosis present

## 2020-04-28 MED ORDER — BUPROPION HCL ER (XL) 300 MG PO TB24: 300.0000 mg | ORAL_TABLET | Freq: Every day | ORAL | 0 refills | Status: DC

## 2020-04-28 MED ORDER — ALPRAZOLAM 0.25 MG PO TABS: 0.2500 mg | ORAL_TABLET | Freq: Every day | ORAL | 0 refills | Status: DC | PRN
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
## 2020-04-28 NOTE — ED Triage Notes (Signed)
Patient walk into the [facility] with SI, Denies HI and AVH. Patient has thoughts without an actual plan at this time. Patient does have a therapist and psychiatrist.

## 2020-04-28 NOTE — Telephone Encounter (Signed)
D:  Dr. Shea Evans referred pt to MH-IOP/PHP.  A:  Placed call to discuss groups with pt, but he states he is unavailable to talk at this time.  Requested that case manager call him tomorrow about group.  Inform Dr. Shea Evans.

## 2020-04-28 NOTE — Progress Notes (Addendum)
This note is not being shared with the patient for the following reason: To prevent harm (release of this note would result in harm to the life or physical safety of the patient or another).  Virtual Visit via Video Note  I connected with [name] on 04/28/20 at 11:00 AM EST by a video enabled telemedicine application and verified that I am speaking with the correct person using two identifiers.  Location Provider Location : [facility] Patient Location : Home  Participants: Patient ,Spouse, Mother, Provider   I discussed the limitations of evaluation and management by telemedicine and the availability of in person appointments. The patient expressed understanding and agreed to proceed.   I discussed the assessment and treatment plan with the patient. The patient was provided an opportunity to ask questions and all were answered. The patient agreed with the plan and demonstrated an understanding of the instructions.   The patient was advised to call back or seek an in-person evaluation if the symptoms worsen or if the condition fails to improve as anticipated.  [facility] MD OP Progress Note  04/28/2020 1:59 PM [name]  MRN:  [id number]  Chief Complaint:  Chief Complaint    Follow-up     HPI: [name] is a 59 year old Caucasian male, married, lives in [place], has a history of MDD, GAD, insomnia, history of tracheal stenosis, status post multiple reintubation, respiratory failure due to opioid overdose, history of CVA, right-sided hemiparesis currently resolved, diabetes, hypertension,NSTEMI, hypothyroidism, chronic pain was evaluated by telemedicine today.  Patient today reports he completed [name] sessions however in spite of completing [name] at this time he does not feel any better with regards to his depressive symptoms.  He reports he may even be feeling worse than before.  Patient reports he struggles with lack of motivation, sadness, anhedonia, low energy and  sleep problems.  He reports the current medications are not very beneficial.  He is on a low dosage of venlafaxine which was recently added however he does not think the medication is beneficial.  Patient reports he also constantly thinks about why he is alive, recently had suicidal thoughts-2 days ago when he thought about jumping in front of a train.  Patient however reports he does not have the courage to do anything like that.  He reports he will talk to his wife and ask for help if he does have thoughts of suicide again.  Patient however reports he wants to get better and does not want to feel this way.  Patient denies any homicidality or perceptual disturbances.  Patient reports he is compliant on all his medications.  Patient denies any side effects.    Visit Diagnosis:    ICD-10-CM   1. Major depressive disorder, recurrent, severe without psychotic features ([name])  F33.2 buPROPion (WELLBUTRIN XL) 300 MG 24 hr tablet    ALPRAZolam (XANAX) 0.25 MG tablet  2. GAD (generalized anxiety disorder)  F41.1 ALPRAZolam (XANAX) 0.25 MG tablet  3. Insomnia due to other mental disorder  F51.05    F99   4. At risk for prolonged QT interval syndrome  Z91.89 EKG 12-Lead    Past Psychiatric History: I have reviewed past psychiatric history from my progress note on 01/15/2019.  Past trials of fluoxetine, Rexulti, Xanax, Abilify.  Patient recently completed [facility]-04/05/2020.  Patient had multiple [name] sessions in the past.  He also has a history of ECT however he did not tolerate it well.  Patient with history of multiple inpatient mental  health admissions in 2020.  Past Medical History:  Past Medical History:  Diagnosis Date  . Depression   . Diabetes (HCC)    Insulin Pump  . Diabetes mellitus type I ([place])   . Diabetes mellitus without complication ([place])   . GERD (gastroesophageal reflux disease)   . H/O laryngectomy   . Heel bone fracture   . Hyperlipidemia   . Hypertension   . Radicular pain  of right lower extremity   . Stroke ([place])   . Suicide attempt [facility]) 2014   damaged larynx - tracheostomy  . Thyroid disease     Past Surgical History:  Procedure Laterality Date  . COLONOSCOPY WITH PROPOFOL N/A 05/15/2018   Procedure: COLONOSCOPY WITH PROPOFOL;  Surgeon: [name], [name], MD;  Location: [facility] ENDOSCOPY;  Service: Gastroenterology;  Laterality: N/A;  . ESOPHAGOGASTRODUODENOSCOPY N/A 05/15/2018   Procedure: ESOPHAGOGASTRODUODENOSCOPY (EGD);  Surgeon: [name], [name], MD;  Location: [facility] ENDOSCOPY;  Service: Gastroenterology;  Laterality: N/A;  . FRACTURE SURGERY    . Heel bone reconstruction Left   . HERNIA REPAIR  21/3086   Umbilical hernia repair   . LARYNGECTOMY    . NECK SURGERY     fusion  . SPINE SURGERY    . TRACHEOSTOMY  2014   from [place] attempt    Family Psychiatric History: I have reviewed family psychiatric history from my progress note on 01/15/2019  Family History:  Family History  Problem Relation Age of Onset  . Osteoporosis Mother   . Diabetes Mother   . Hypertension Father   . Mental illness Neg Hx     Social History: Reviewed social history from my progress note on 01/15/2019 Social History   Socioeconomic History  . Marital status: Married    Spouse name: Not on file  . Number of children: Not on file  . Years of education: Not on file  . Highest education level: Not on file  Occupational History  . Not on file  Tobacco Use  . Smoking status: Former Smoker    Packs/day: 0.00    Types: Cigarettes    Quit date: 04/09/2013    Years since quitting: 7.0  . Smokeless tobacco: Never Used  Vaping Use  . Vaping Use: Never used  Substance and Sexual Activity  . Alcohol use: Yes    Alcohol/week: 2.0 standard drinks    Types: 2 Shots of liquor per week    Comment: rare  . Drug use: No    Comment: Pt denied; UDS not available  . Sexual activity: Yes    Partners: Female    Birth control/protection: Condom  Other Topics Concern  . Not on  file  Social History Narrative  . Not on file   Social Determinants of Health   Financial Resource Strain: Not on file  Food Insecurity: Not on file  Transportation Needs: Not on file  Physical Activity: Not on file  Stress: Not on file  Social Connections: Not on file    Allergies:  Allergies  Allergen Reactions  . Buspar [Buspirone]     Makes the patient "flip out"  . Depakote [Valproic Acid]     Causes excessive drowsiness  . Clopidogrel Rash  . Gabapentin Itching and Rash    Metabolic Disorder Labs: Lab Results  Component Value Date   HGBA1C 7.8 (H) 01/01/2019   MPG 177.16 01/01/2019   MPG 174.29 12/23/2018   No results found for: PROLACTIN Lab Results  Component Value Date   CHOL 161 09/15/2016  TRIG 190 (H) 09/15/2016   HDL 41 09/15/2016   CHOLHDL 3.9 09/15/2016   VLDL 38 09/15/2016   LDLCALC 82 09/15/2016   Lab Results  Component Value Date   TSH 1.952 09/15/2016    Therapeutic Level Labs: No results found for: LITHIUM Lab Results  Component Value Date   VALPROATE 19 (L) 09/12/2016   No components found for:  CBMZ  Current Medications: Current Outpatient Medications  Medication Sig Dispense Refill  . albuterol (ACCUNEB) 1.25 MG/3ML nebulizer solution SMARTSIG:3 Milliliter(s) Via Nebulizer Every 6 Hours PRN (Patient not taking: Reported on 12/30/2019)    . ALPRAZolam (XANAX) 0.25 MG tablet Take 1 tablet (0.25 mg total) by mouth daily as needed for anxiety. 30 tablet 0  . amLODipine (NORVASC) 5 MG tablet Take 1 tablet (5 mg total) by mouth daily. 30 tablet 1  . aspirin EC 81 MG tablet Take 1 tablet (81 mg total) by mouth daily. 30 tablet 1  . buPROPion (WELLBUTRIN XL) 300 MG 24 hr tablet Take 1 tablet (300 mg total) by mouth daily. 90 tablet 0  . ciprofloxacin (CIPRO) 250 MG tablet Take 250 mg by mouth 2 (two) times daily.    . Continuous Blood Gluc Receiver (FREESTYLE LIBRE 14 DAY READER) DEVI FreeStyle Libre 14 Day Reader    . Continuous Blood  Gluc Sensor (FREESTYLE LIBRE 14 DAY SENSOR) MISC FreeStyle Libre 14 Day Sensor kit    . empagliflozin (JARDIANCE) 25 MG TABS tablet Take 25 mg by mouth daily. 30 tablet 1  . FLUCELVAX QUADRIVALENT 0.5 ML injection     . glycopyrrolate (ROBINUL) 1 MG tablet Take by mouth.    Marland Kitchen glycopyrrolate (ROBINUL) 1 MG tablet  (Patient not taking: Reported on 12/30/2019)    . hydrochlorothiazide (HYDRODIURIL) 25 MG tablet hydrochlorothiazide 25 mg tablet (Patient not taking: Reported on 12/30/2019)    . insulin aspart (NOVOLOG) 100 UNIT/ML injection INJECT UP TO 120 UNITS UNDER THE SKIN AS DIRECTED DAILY VIA INSULIN PUMP    . insulin aspart (NOVOLOG) 100 UNIT/ML injection INJECT UP TO 120 UNITS UNDER THE SKIN UTD D VIA INSULIN PUMP (Patient not taking: Reported on 12/30/2019)    . levofloxacin (LEVAQUIN) 750 MG tablet Take 750 mg by mouth daily.    Marland Kitchen levothyroxine (SYNTHROID) 75 MCG tablet TAKE 1 TABLET BY MOUTH DAILY AT 6 AM 90 tablet 0  . losartan-hydrochlorothiazide (HYZAAR) 100-25 MG tablet TAKE 1 TABLET BY MOUTH EVERY DAY    . OLANZapine (ZYPREXA) 10 MG tablet TAKE 1 TABLET(10 MG) BY MOUTH AT BEDTIME 30 tablet 1  . pantoprazole (PROTONIX) 40 MG tablet  (Patient not taking: Reported on 12/30/2019)    . pantoprazole (PROTONIX) 40 MG tablet TAKE 1 TABLET(40 MG) BY MOUTH TWICE DAILY    . rosuvastatin (CRESTOR) 20 MG tablet Take 20 mg by mouth at bedtime.    . rosuvastatin (CRESTOR) 20 MG tablet Take by mouth. (Patient not taking: Reported on 12/30/2019)    . simvastatin (ZOCOR) 20 MG tablet TAKE 1 TABLET(20 MG) BY MOUTH EVERY NIGHT    . tobramycin, PF, (TOBI) 300 MG/5ML nebulizer solution     . traMADol (ULTRAM) 50 MG tablet Take 100 mg by mouth every 6 (six) hours as needed. (Patient not taking: Reported on 12/30/2019)    . traZODone (DESYREL) 100 MG tablet TAKE 1/2 TO 1 TABLET BY MOUTH AT BEDTIME AS NEEDED FOR SLEEP 180 tablet 0  . venlafaxine XR (EFFEXOR-XR) 37.5 MG 24 hr capsule Take 1 capsule (37.5 mg total)  by mouth daily with breakfast. 30 capsule 1   No current facility-administered medications for this visit.     Musculoskeletal: Strength & Muscle Tone: UTA Gait & Station: UTA Patient leans: N/A  Psychiatric Specialty Exam: Review of Systems  Psychiatric/Behavioral: Positive for decreased concentration, dysphoric mood and sleep disturbance. The patient is nervous/anxious.   All other systems reviewed and are negative.   There were no vitals taken for this visit.There is no height or weight on file to calculate BMI.  General Appearance: Casual  Eye Contact:  Fair  Speech:  Slow  Volume:  Decreased- S/P Tracheostomy  Mood:  Anxious, Depressed and Dysphoric  Affect:  Depressed  Thought Process:  Goal Directed and Descriptions of Associations: Intact  Orientation:  Full (Time, Place, and Person)  Thought Content: Logical   Suicidal Thoughts:  No- Had recent suicidal thoughts with plan  Homicidal Thoughts:  No  Memory:  Immediate;   Fair Recent;   Fair Remote;   Fair  Judgement:  Fair  Insight:  Fair  Psychomotor Activity:  Normal  Concentration:  Concentration: Fair and Attention Span: Fair  Recall:  [employer] of Knowledge: Fair  Language: Fair  Akathisia:  No  Handed:  Right  AIMS (if indicated): UTA  Assets:  Communication Skills Desire for [place] Talents/Skills  ADL's:  Intact  Cognition: WNL  Sleep:  Poor   Screenings: AUDIT   Flowsheet Row Admission (Discharged) from 01/22/2019 in [facility] Admission (Discharged) from 12/31/2018 in [facility] Admission (Discharged) from 09/14/2016 in [facility]  Alcohol Use Disorder Identification Test Final Score (AUDIT) 0 0 1    ECT-MADRS   Flowsheet Row Admission (Discharged) from 01/22/2019 in [facility] Total Score [place] Admission (Discharged) from  01/22/2019 in [facility]  Total Score (max 30 points ) 30    PHQ2-9   [facility] from 04/28/2020 in [facility] Video Visit from 09/17/2019 in [facility] Office Visit from 06/11/2019 in [facility] Office Visit from 03/13/2019 in [facility] Office Visit from 02/20/2019 in [facility]  PHQ-2 Total Score _0 PHQ-9 Total Score 20 3 -- 16 21       Assessment and Plan: Simpson Paulos is a 59 year old Caucasian male, married, on disability, has a history of multiple medical problems, MDD, GAD was evaluated by telemedicine today.  Patient is biologically predisposed given his multiple health issues.  Patient with psychosocial stressors of the pandemic, his own health issues, wife's medical problems.  Patient recently completed [place] however in spite of that continues to struggle.  Discussed plan as noted below. Risk factors for suicide-white race, male, recent suicidality with the plan, history of suicide attempt in the past which was serious, treatment resistant depression, unemployed. Protective factors-has good social support system, is motivated to stay in treatment, compliant on medications, denies substance abuse problems, contracts for safety. Acute risk for suicide is low to moderate. Discussed plan as noted below.  Plan MDD severe recurrent no psychosis-unstable Increase Wellbutrin XL to 300 mg p.o. daily Continue venlafaxine extended release 37.5 mg p.o. daily with breakfast We will consider stopping Zyprexa and starting another mood stabilizer like Geodon.  However I do not see a recent EKG hence will order EKG and review it before  making further medication changes. Patient with history of stage III kidney disease, recent elevation of creatinine, may not be a good candidate for lithium therapy. If  patient continues to decompensate, will recommend inpatient mental health admissions. Could also consider ketamine infusion, will consider referring patient. I have communicated with staff at [facility] ( for ketamine) who will fax referral form to me - for future referral of patient as needed after getting patient consent. Trazodone 50 to 100 mg p.o. nightly as needed at reduced dosage.  GAD-unstable Start Xanax 0.25 mg as needed for severe anxiety attacks only. Continue venlafaxine as prescribed  Insomnia-unstable Continue trazodone and Zyprexa for now. We will consider making more changes during future sessions.  At risk for prolonged QT syndrome-we will order EKG.  Patient will get EKG completed.  Will fax [facility] to [facility].   I have communicated with spouse-Wendy-discussed patient's recent suicidality, discussed that patient will benefit from inpatient mental health admission if he continues to have worsening depressive symptoms.  Provided information for behavioral health urgent care, community resources.  Wife to discuss with patient about going to the nearest emergency department or urgent care.  I have communicated with mother-Ms. Carrol-discussed patient's worsening mood symptoms, suicidal thoughts.  Mother agrees to offer support.  Follow-up in clinic in 10 days or sooner if needed.  I have spent atleast 40 minutes face to face by video with patient today. More than 50 % of the time was spent for preparing to see the patient ( e.g., review of test, records ), obtaining and to review and separately obtained history , ordering medications and test ,psychoeducation and supportive psychotherapy and care coordination,as well as documenting clinical information in electronic health records. This note was generated in part or whole with voice recognition software. Voice recognition is usually quite accurate but there are transcription errors that can and very often do occur. I apologize for any  typographical errors that were not detected and corrected.       Ursula Alert, MD 04/28/2020, 1:59 PM

## 2020-04-28 NOTE — ED Provider Notes (Addendum)
Behavioral Health Urgent Care Medical Screening Exam  Patient Name: [name] MRN: [id number] Date of Evaluation: 04/28/20 Chief Complaint:   Diagnosis:  Final diagnoses:  Major depressive disorder, recurrent, severe without psychotic features ([place])  GAD (generalized anxiety disorder)  Recurrent major depression resistant to treatment [facility])    History of Present illness: [name] is a 59 y.o. male patient presents as a walk in at [facility] with complaints of depression and passive suicidal ideation.   Darrol Holder, 59 y.o., male patient seen face to face by this provider, consulted with Dr. Serafina Mitchell; and chart reviewed on 04/28/20.  On evaluation [name] reports he saw his psychiatrist today Dr. Shea Evans.  States he was told to go to [facility] for psychiatric evaluation or he could come her.  Patient reports he continues to be derepressed and suicidal thoughts off and on.  States he has had thoughts of stepping in front of a train but is unable to do it.  Patient asked if he was to go home today would he do anything to harm himself and responded "No I would just go home and go to bed."  Patient states he has had so many medication changes he is not sure of what he should or shouldn't take and doesn't feel that he is getting any better.  Patient also states he has just completed [place] and is feeling no better.      Dr. Shea Evans assessment note reviewed and has similar complaints of patient at this time:  That patient continues to lack motivation, anhedonia, low energy, and sadness.  Suicidal ideation to jump in front of train but lacks courage to do anything like that.  Reported that current medications nor beneficial.  He was also able to contract for safety stating that he would reach out to his wife if started to have suicidal thoughts again. At that time patient was referred to partial hospitalization program (PHP).    Prior to start of this visit patient was called to  setup PHP but told caller he couldn't talk at that time.    Patient has history of treatment resistant major depression and has been tried on multiple medications.   : Patient has medication trials of fluoxetine, Rexulti, Xanax, Abilify.  He recently completed [place] 04/05/2020.  Patient has also had multiple [place] sessions in the past.  He also has a history of ECT but did not tolerate well.  Patient has had multiple inpatient mental health admissions in 2020.  During evaluation [name] is alert/oriented x 4; calm/cooperative; and mood depressed and congruent with affect.  He does not appear to be responding to internal/external stimuli or delusional thoughts.  Patient denies suicidal/self-harm/homicidal ideation, psychosis, and paranoia; although patient has endorsed passive suicidal thoughts of jumping in front of train; but states he would never do it.  Patient gave permission to speak to his mother for collateral information.  Patient answered question appropriately.   Collateral information gathered from patients mother by TTS.  Please see note for full details.  Mother states that patient can come to her home and stay for a few days to get away from wife and step daughter.   Patient encouraged to follow up with PHP program.     The suicide prevention education provided includes the following:  Suicide risk factors  Suicide prevention and interventions  National Suicide Hotline telephone number  [facility] assessment telephone number  [facility] Emergency Assistance 911  County and/or Residential Mobile Crisis Unit telephone number   Request made of family/significant other to:  Remove weapons (e.g., guns, rifles, knives), all items previously/currently identified as safety concern.   Remove drugs/medications (over the counter, prescriptions, illicit drugs), all items previously/currently identified as a safety concern.    Psychiatric Specialty  Exam  Presentation  General Appearance:Appropriate for Environment; Casual  Eye Contact:Good  Speech:Clear and Coherent; Normal Rate  Speech Volume:Normal  Handedness:Right   Mood and Affect  Mood:Depressed  Affect:Congruent; Depressed   Thought Process  Thought Processes:Coherent; Goal Directed  Descriptions of Associations:Intact  Orientation:Full (Time, Place and Person)  Thought Content:WDL  Hallucinations:None  Ideas of Reference:None  Suicidal Thoughts:Yes, Passive (Patient states he has thought about stepping in front of a train but just couldn't bring himself to do it.  Patient states if he was to leave the hospital today he would not do anything to harm himself.  "I would jus go home and go to bed") Without Intent; With Plan; With Means to Carry Out (Patient has made statement of walking in front of train multiple times)  Homicidal Thoughts:No   Sensorium  Memory:No data recorded Judgment:Intact  Insight:Fair; Present   Executive Functions  Concentration:Good  Attention Span:Good  [place] of Knowledge:Good  Language:Good   Psychomotor Activity  Psychomotor Activity:Normal   Assets  Assets:Communication Skills; Desire for Improvement; Housing; Social Support; Transportation   Sleep  Sleep:Good  Number of hours: No data recorded  Physical Exam: Physical Exam Vitals and nursing note reviewed. Exam conducted with a chaperone present.  Constitutional:      General: He is not in acute distress.    Appearance: Normal appearance. He is obese. He is not ill-appearing.  HENT:     Head: Normocephalic and atraumatic.  Eyes:     Pupils: Pupils are equal, round, and reactive to light.  Cardiovascular:     Rate and Rhythm: Normal rate.  Pulmonary:     Effort: Pulmonary effort is normal.     Breath sounds: Normal breath sounds.     Comments: Trach Musculoskeletal:        General: Normal range of motion.     Cervical back:  Normal range of motion.  Skin:    General: Skin is warm and dry.  Neurological:     Mental Status: He is alert and oriented to person, place, and time.  Psychiatric:        Attention and Perception: Attention and perception normal. He does not perceive auditory or visual hallucinations.        Mood and Affect: Affect normal. Mood is depressed.        Speech: Speech normal.        Behavior: Behavior normal. Behavior is cooperative.        Thought Content: Thought content normal. Thought content is not paranoid or delusional. Thought content does not include homicidal or suicidal (Passive able to contract for safety) ideation.        Cognition and Memory: Cognition and memory normal.        Judgment: Judgment normal.    Review of Systems  Constitutional: Negative.   HENT: Negative.   Eyes: Negative.   Respiratory: Negative.        Has trach   Cardiovascular: Negative.   Gastrointestinal: Negative.   Genitourinary: Negative.   Musculoskeletal: Negative.   Skin: Negative.   Neurological: Negative.   Endo/Heme/Allergies: Negative.   Psychiatric/Behavioral: Positive for depression. Negative for hallucinations. Suicidal ideas: Has  had passive thoughts of stepping in front of train but states he is unable to do that; is able to contract for safety. Nervous/anxious: Stable.        Patient reporting his main stressors are being at home with wife and daughter, having to clean up after them and a lot of arguing.  Stating he does not want to go home.  Patient also states his physical health is a stressor.     There were no vitals taken for this visit. There is no height or weight on file to calculate BMI.  Musculoskeletal: Strength & Muscle Tone: within normal limits Gait & Station: normal Patient leans: N/A   [place] [facility] Discharge Disposition for Follow up and Recommendations: Based on my evaluation the patient does not appear to have an emergency medical condition and can be discharged  with resources and follow up care in outpatient services for Medication Management, Partial Hospitalization Program, Group Therapy and Family Therapy    Patient to follow up with Dr. Shea Evans in 10 day.  Patient to keep scheduled appointment.    Follow-up Information    [facility]   Why: Keep scheduled appointment with Dr. Shea Evans in 10 days sooner if needed             Secure message sent to Dr. Shea Evans addressing current visit and to [facility] outpatient psychiatric services that could follow up with patient for IOP?PHP appointment intake.   Also spoke with Dr. Shea Evans via telephone updating on current visit that patient will be going to stay with his mother; discussed patient stressors.  Dr. Shea Evans also wanted an EKG prior to patients discharge.    Kiosha Buchan, NP 04/28/2020, 3:48 PM

## 2020-04-28 NOTE — ED Notes (Signed)
Patient A&O x 4, ambulatory. Patient discharged in no acute distress. Patient denied SI/HI, A/VH upon discharge. Patient verbalized understanding of all discharge instructions explained by staff, to include follow up appointments, RX's and safety plan. Pt belongings returned to patient from locker #15 intact. Patient escorted to lobby via staff for transport to destination. Safety maintained.

## 2020-04-29 DIAGNOSIS — R809 Proteinuria, unspecified: Secondary | ICD-10-CM | POA: Diagnosis not present

## 2020-04-29 DIAGNOSIS — E1029 Type 1 diabetes mellitus with other diabetic kidney complication: Secondary | ICD-10-CM | POA: Diagnosis not present

## 2020-05-03 ENCOUNTER — Telehealth (HOSPITAL_COMMUNITY): Payer: PPO | Admitting: Psychiatry

## 2020-05-06 ENCOUNTER — Other Ambulatory Visit: Payer: Self-pay | Admitting: Psychiatry

## 2020-05-06 DIAGNOSIS — F411 Generalized anxiety disorder: Secondary | ICD-10-CM

## 2020-05-06 DIAGNOSIS — F332 Major depressive disorder, recurrent severe without psychotic features: Secondary | ICD-10-CM

## 2020-05-11 ENCOUNTER — Telehealth (INDEPENDENT_AMBULATORY_CARE_PROVIDER_SITE_OTHER): Payer: PPO | Admitting: Psychiatry

## 2020-05-11 ENCOUNTER — Encounter: Payer: Self-pay | Admitting: Psychiatry

## 2020-05-11 ENCOUNTER — Other Ambulatory Visit: Payer: Self-pay

## 2020-05-11 DIAGNOSIS — F411 Generalized anxiety disorder: Secondary | ICD-10-CM | POA: Diagnosis not present

## 2020-05-11 DIAGNOSIS — Z9189 Other specified personal risk factors, not elsewhere classified: Secondary | ICD-10-CM

## 2020-05-11 DIAGNOSIS — F3341 Major depressive disorder, recurrent, in partial remission: Secondary | ICD-10-CM | POA: Diagnosis not present

## 2020-05-11 DIAGNOSIS — F5105 Insomnia due to other mental disorder: Secondary | ICD-10-CM

## 2020-05-11 MED ORDER — CLONAZEPAM 1 MG PO TABS
0.5000 mg | ORAL_TABLET | Freq: Every day | ORAL | 1 refills | Status: DC | PRN
Start: 2020-05-11 — End: 2020-07-02

## 2020-05-11 MED ORDER — OLANZAPINE 10 MG PO TABS: ORAL_TABLET | ORAL | 1 refills | Status: DC

## 2020-05-11 NOTE — Progress Notes (Unsigned)
Virtual Visit via Video Note  I connected with [name] on 05/11/20 at 11:45 AM EST by a video enabled telemedicine application and verified that I am speaking with the correct person using two identifiers.  Location Provider Location : [facility] Patient Location : Home  Participants: Patient , Provider   I discussed the limitations of evaluation and management by telemedicine and the availability of in person appointments. The patient expressed understanding and agreed to proceed.   I discussed the assessment and treatment plan with the patient. The patient was provided an opportunity to ask questions and all were answered. The patient agreed with the plan and demonstrated an understanding of the instructions.   The patient was advised to call back or seek an in-person evaluation if the symptoms worsen or if the condition fails to improve as anticipated.  [facility] MD OP Progress Note  05/11/2020 12:22 PM Adriaan Maltese  MRN:  [id number]  Chief Complaint:  Chief Complaint    Follow-up     HPI: [name] is a 60 year old Caucasian male, married, lives in [place], has a history of MDD, GAD, insomnia, history of tracheal stenosis, status post multiple reintubation, respiratory failure due to opioid overdose, history of CVA, right-sided hemiparesis currently resolved, diabetes, hypertension, NSTEMI, hypothyroidism, chronic pain was evaluated by telemedicine today.  Patient today reports he is currently making progress with regards to his depression.  He reports he does not feel as sad as he used to before.  The Wellbutrin definitely helps.  He is also able to do activities during the day since he is not sleeping too much during the day.  Patient however reports he does feel a lot more anxious than before.  His anxiety is worse at night and that does keep him awake.  He reports that Xanax helps to some extent.  Patient denies any suicidality, homicidality or perceptual  disturbances.  Patient reports he is not interested in starting Geodon as we discussed last visit since he believes the current combination is beneficial.  He will let writer know when he is ready.  He however is interested in increasing his Xanax dosage or start another medication for his anxiety.  Patient denies any other concerns today.  Visit Diagnosis:    ICD-10-CM   1. MDD (major depressive disorder), recurrent, in partial remission (HCC)  F33.41 OLANZapine (ZYPREXA) 10 MG tablet  2. GAD (generalized anxiety disorder)  F41.1 clonazePAM (KLONOPIN) 1 MG tablet  3. Insomnia due to mental condition  F51.05    anxiety  4. At risk for prolonged QT interval syndrome  Z91.89     Past Psychiatric History: I have reviewed past psychiatric history from my progress note on 01/15/2019.  Past trials of fluoxetine, Rexulti, Xanax, Abilify.  Patient recently completed TMS-04/05/2020.  Patient had multiple [place] sessions in the past.  He also has a history of ECT however did not tolerate it well.  Patient with history of multiple inpatient mental health admissions in 2020.  Past Medical History:  Past Medical History:  Diagnosis Date  . Depression   . Diabetes (HCC)    Insulin Pump  . Diabetes mellitus type I ([place])   . Diabetes mellitus without complication ([place])   . GERD (gastroesophageal reflux disease)   . H/O laryngectomy   . Heel bone fracture   . Hyperlipidemia   . Hypertension   . Radicular pain of right lower extremity   . Stroke ([place])   . Suicide attempt [facility]) 2014  damaged larynx - tracheostomy  . Thyroid disease     Past Surgical History:  Procedure Laterality Date  . COLONOSCOPY WITH PROPOFOL N/A 05/15/2018   Procedure: COLONOSCOPY WITH PROPOFOL;  Surgeon: [name], [name], MD;  Location: [facility] ENDOSCOPY;  Service: Gastroenterology;  Laterality: N/A;  . ESOPHAGOGASTRODUODENOSCOPY N/A 05/15/2018   Procedure: ESOPHAGOGASTRODUODENOSCOPY (EGD);  Surgeon: [name], [name], MD;   Location: [facility] ENDOSCOPY;  Service: Gastroenterology;  Laterality: N/A;  . FRACTURE SURGERY    . Heel bone reconstruction Left   . HERNIA REPAIR  [id number]   Umbilical hernia repair   . LARYNGECTOMY    . NECK SURGERY     fusion  . SPINE SURGERY    . TRACHEOSTOMY  2014   from [place] attempt    Family Psychiatric History: I have reviewed family psychiatric history from my progress note on 01/15/2019  Family History:  Family History  Problem Relation Age of Onset  . Osteoporosis Mother   . Diabetes Mother   . Hypertension Father   . Mental illness Neg Hx     Social History: Reviewed social history from my progress note on 01/15/2019 Social History   Socioeconomic History  . Marital status: Married    Spouse name: Not on file  . Number of children: Not on file  . Years of education: Not on file  . Highest education level: Not on file  Occupational History  . Not on file  Tobacco Use  . Smoking status: Former Smoker    Packs/day: 0.00    Types: Cigarettes    Quit date: 04/09/2013    Years since quitting: 7.0  . Smokeless tobacco: Never Used  Vaping Use  . Vaping Use: Never used  Substance and Sexual Activity  . Alcohol use: Yes    Alcohol/week: 2.0 standard drinks    Types: 2 Shots of liquor per week    Comment: rare  . Drug use: No    Comment: Pt denied; UDS not available  . Sexual activity: Yes    Partners: Female    Birth control/protection: Condom  Other Topics Concern  . Not on file  Social History Narrative  . Not on file   Social Determinants of Health   Financial Resource Strain: Not on file  Food Insecurity: Not on file  Transportation Needs: Not on file  Physical Activity: Not on file  Stress: Not on file  Social Connections: Not on file    Allergies:  Allergies  Allergen Reactions  . Buspar [Buspirone]     Makes the patient "flip out"  . Depakote [Valproic Acid]     Causes excessive drowsiness  . Clopidogrel Rash  . Gabapentin Itching and Rash     Metabolic Disorder Labs: Lab Results  Component Value Date   HGBA1C 7.8 (H) 01/01/2019   MPG 177.16 01/01/2019   MPG 174.29 12/23/2018   No results found for: PROLACTIN Lab Results  Component Value Date   CHOL 161 09/15/2016   TRIG 190 (H) 09/15/2016   HDL 41 09/15/2016   CHOLHDL 3.9 09/15/2016   VLDL 38 09/15/2016   LDLCALC 82 09/15/2016   Lab Results  Component Value Date   TSH 1.952 09/15/2016    Therapeutic Level Labs: No results found for: LITHIUM Lab Results  Component Value Date   VALPROATE 19 (L) 09/12/2016   No components found for:  CBMZ  Current Medications: Current Outpatient Medications  Medication Sig Dispense Refill  . clonazePAM (KLONOPIN) 1 MG tablet Take 0.5-1 tablets (0.5-1  mg total) by mouth daily as needed for anxiety. For severe anxiety attacks 30 tablet 1  . amLODipine (NORVASC) 5 MG tablet Take 1 tablet (5 mg total) by mouth daily. 30 tablet 1  . aspirin EC 81 MG tablet Take 1 tablet (81 mg total) by mouth daily. 30 tablet 1  . buPROPion (WELLBUTRIN XL) 300 MG 24 hr tablet Take 1 tablet (300 mg total) by mouth daily. 90 tablet 0  . ciprofloxacin (CIPRO) 250 MG tablet Take 250 mg by mouth 2 (two) times daily.    . Continuous Blood Gluc Receiver (FREESTYLE LIBRE 14 DAY READER) DEVI FreeStyle Libre 14 Day Reader    . Continuous Blood Gluc Sensor (FREESTYLE LIBRE 14 DAY SENSOR) MISC FreeStyle Libre 14 Day Sensor kit    . empagliflozin (JARDIANCE) 25 MG TABS tablet Take 25 mg by mouth daily. 30 tablet 1  . FLUCELVAX QUADRIVALENT 0.5 ML injection     . glycopyrrolate (ROBINUL) 1 MG tablet Take by mouth.    . insulin aspart (NOVOLOG) 100 UNIT/ML injection INJECT UP TO 120 UNITS UNDER THE SKIN AS DIRECTED DAILY VIA INSULIN PUMP    . levofloxacin (LEVAQUIN) 750 MG tablet Take 750 mg by mouth daily.    Marland Kitchen levothyroxine (SYNTHROID) 75 MCG tablet TAKE 1 TABLET BY MOUTH DAILY AT 6 AM 90 tablet 0  . losartan-hydrochlorothiazide (HYZAAR) 100-25 MG tablet  TAKE 1 TABLET BY MOUTH EVERY DAY    . OLANZapine (ZYPREXA) 10 MG tablet TAKE 1 TABLET(10 MG) BY MOUTH AT BEDTIME 30 tablet 1  . pantoprazole (PROTONIX) 40 MG tablet TAKE 1 TABLET(40 MG) BY MOUTH TWICE DAILY    . rosuvastatin (CRESTOR) 20 MG tablet Take 20 mg by mouth at bedtime.    . simvastatin (ZOCOR) 20 MG tablet TAKE 1 TABLET(20 MG) BY MOUTH EVERY NIGHT    . tobramycin, PF, (TOBI) 300 MG/5ML nebulizer solution     . traZODone (DESYREL) 100 MG tablet TAKE 1/2 TO 1 TABLET BY MOUTH AT BEDTIME AS NEEDED FOR SLEEP 180 tablet 0  . venlafaxine XR (EFFEXOR-XR) 37.5 MG 24 hr capsule TAKE 1 CAPSULE(37.5 MG) BY MOUTH DAILY WITH BREAKFAST 30 capsule 1   No current facility-administered medications for this visit.     Musculoskeletal: Strength & Muscle Tone: UTA Gait & Station: UTA Patient leans: N/A  Psychiatric Specialty Exam: Review of Systems  Psychiatric/Behavioral: Positive for sleep disturbance. The patient is nervous/anxious.   All other systems reviewed and are negative.   There were no vitals taken for this visit.There is no height or weight on file to calculate BMI.  General Appearance: Casual  Eye Contact:  Fair  Speech:  Slow  Volume:  S/P Tracheostomy  Mood:  Anxious  Affect:  Congruent  Thought Process:  Goal Directed and Descriptions of Associations: Intact  Orientation:  Full (Time, Place, and Person)  Thought Content: Logical   Suicidal Thoughts:  No  Homicidal Thoughts:  No  Memory:  Immediate;   Fair Recent;   Fair Remote;   Fair  Judgement:  Fair  Insight:  Fair  Psychomotor Activity:  Normal  Concentration:  Concentration: Fair and Attention Span: Fair  Recall:  [employer] of Knowledge: Fair  Language: Fair  Akathisia:  No  Handed:  Right  AIMS (if indicated): UTA  Assets:  Communication Skills Desire for Improvement Housing Intimacy Social Support Transportation Vocational/Educational  ADL's:  Intact  Cognition: WNL  Sleep:  Poor    Screenings: AUDIT   Physiological scientist  Admission (Discharged) from 01/22/2019 in [facility] Admission (Discharged) from 12/31/2018 in [facility] [place] Admission (Discharged) from 09/14/2016 in [facility]  Alcohol Use Disorder Identification Test Final Score (AUDIT) 0 0 1    ECT-MADRS   Flowsheet Row Admission (Discharged) from 01/22/2019 in [facility] Total Score 27    Mini-Mental   [facility] Admission (Discharged) from 01/22/2019 in [facility]  Total Score (max 30 points ) 30    PHQ2-9   Flowsheet Row Video Visit from 05/11/2020 in [place] ED from 04/28/2020 in [facility] Video Visit from 09/17/2019 in [place] Office Visit from 06/11/2019 in [place] Office Visit from 03/13/2019 in [place]  PHQ-2 Total Score _0 PHQ-9 Total Score _1 - 16       Assessment and Plan: Kash Davie is a 60 year old Caucasian male, married, on disability, has a history of multiple medical problems, MDD, GAD was evaluated by telemedicine today.  Patient is biologically predisposed given his multiple health issues.  Patient with psychosocial stressors of pandemic, his own health issues, is currently making progress.  Plan as noted below.  Plan  MDD in partial remission PHQ 9 today equals 7 Continue Wellbutrin XL 300 mg p.o. daily Patient continues to struggle with sleep more so because of anxiety. We will discontinue Xanax. Start Klonopin 0.5-1 mg p.o. daily as needed for anxiety.  Patient advised to limit use.  Provided education about long-term risk of benzodiazepine therapy. I have reviewed [place] controlled substance database. Continue Zyprexa 10 mg p.o. nightly. Patient is not interested in changing the Zyprexa to Geodon  as discussed last visit. Continue trazodone 50 to 100 mg p.o. nightly as needed.  GAD-unstable Continue venlafaxine extended release 37.5 mg p.o. daily Discontinue Xanax. Start Klonopin 0.5-1 mg p.o. daily as needed Continue psychotherapy sessions.  Insomnia-unstable Continue trazodone and Zyprexa as prescribed Start Klonopin 0.5-1 mg p.o. daily as needed for anxiety.  At risk for prolonged QT syndrome-reviewed and discussed EKG-dated 04/28/2020-normal sinus rhythm.  Provided information about beautiful minds, discussed to consider esketamine treatment if he decompensate again.  Patient provided information.  Also discussed crisis plan with patient.  Follow-up in clinic in 2 weeks or sooner if needed.  I have spent atleast 30 minutes  face to face by video with patient today. More than 50 % of the time was spent for preparing to see the patient ( e.g., review of test, records ), obtaining and to review and separately obtained history , ordering medications and test ,psychoeducation and supportive psychotherapy and care coordination,as well as documenting clinical information in electronic health record,interpreting and communication of test results This note was generated in part or whole with voice recognition software. Voice recognition is usually quite accurate but there are transcription errors that can and very often do occur. I apologize for any typographical errors that were not detected and corrected.      Ursula Alert, MD 05/12/2020, 8:28 AM

## 2020-05-12 ENCOUNTER — Ambulatory Visit (INDEPENDENT_AMBULATORY_CARE_PROVIDER_SITE_OTHER): Payer: HMO | Admitting: Clinical

## 2020-05-12 ENCOUNTER — Other Ambulatory Visit: Payer: Self-pay | Admitting: Psychiatry

## 2020-05-12 DIAGNOSIS — F332 Major depressive disorder, recurrent severe without psychotic features: Secondary | ICD-10-CM

## 2020-05-12 DIAGNOSIS — F3341 Major depressive disorder, recurrent, in partial remission: Secondary | ICD-10-CM

## 2020-05-12 DIAGNOSIS — F411 Generalized anxiety disorder: Secondary | ICD-10-CM | POA: Diagnosis not present

## 2020-05-12 NOTE — Progress Notes (Signed)
Virtual Visit via Telephone Note  I connected with [name] on 05/12/20 at  3:00 PM EST by telephone and verified that I am speaking with the correct person using two identifiers.  Location: Patient: Home Provider: Office   I discussed the limitations, risks, security and privacy concerns of performing an evaluation and management service by telephone and the availability of in person appointments. I also discussed with the patient that there may be a patient responsible charge related to this service. The patient expressed understanding and agreed to proceed.   THERAPIST PROGRESS NOTE  Session Time:3:00PM-3:40PM  Participation Level:Active  Behavioral Response:CasualAlertIrratible  Type of Therapy:Individual Therapy  Treatment Goals addressed:Coping  Interventions:CBT, Solution Focused, Strength-based and Supportive  Summary:[name] a 60 y.o.malewho presents with Major Depression and Generalized Anxiety.The OPT therapist worked with thepatientfor his ongoing OPT treatment. The OPT therapist utilized Motivational Interviewing to assist in creating therapeutic repore. The patient in the session was engaged and work in collaboration giving feedback about his triggers and symptoms over the past few weeksincluding ongoing conserns around his step-daughter continuing to be at the home and not yet finding a place to move out to yet. The OPT therapist utilized Cognitive Behavioral Therapy through cognitive restructuring as well as worked with the patient on coping strategies to assist in management of Anxietyand Depression. The OPT therapist inquired for holistic care about the patients adherence to medication therapy.The patient spoke about getting overwhelmed and visiting Behavioral Health in [place], but not being admitted rather talking to Dr. Elna Breslow who made a medication change and the patient returned home; after this the patient noticed with  the medication change having significantly improve having more energy, higher activity level, and good interaction with his significant other.  Suicidal/Homicidal:Nowithout intent/plan  Therapist Response:The OPT therapist worked with the patient for the patients scheduled session. The patient was engaged in his session and gave feedback in relation to triggers, symptoms, and behavior responses over the past fewweeks. The OPT therapist worked with the patient utilizing an in session Cognitive Behavioral Therapy exercise. The patient was responsive in the session and verbalized, " I am at a place since my last medication change that I feel like I am managing much better".The patient noted recent improvement at home in his interactions with his wife . The OPT therapist will continue treatment work with the patient in his next scheduled session.  Plan: Return again in2/3weeks.  Diagnosis:Axis I:Generalized Anxiety Disorder and Major Depression, Recurrent severe w/o psy features  Axis II:No diagnosis   I discussed the assessment and treatment plan with the patient. The patient was provided an opportunity to ask questions and all were answered. The patient agreed with the plan and demonstrated an understanding of the instructions.  The patient was advised to call back or seek an in-person evaluation if the symptoms worsen or if the condition fails to improve as anticipated.  I provided 40 minutes of non-face-to-face time during this encounter.   Suzan Garibaldi, LCSW 05/12/2020

## 2020-05-21 DIAGNOSIS — E1029 Type 1 diabetes mellitus with other diabetic kidney complication: Secondary | ICD-10-CM | POA: Diagnosis not present

## 2020-05-21 DIAGNOSIS — R809 Proteinuria, unspecified: Secondary | ICD-10-CM | POA: Diagnosis not present

## 2020-05-31 ENCOUNTER — Other Ambulatory Visit: Payer: Self-pay

## 2020-05-31 ENCOUNTER — Telehealth (INDEPENDENT_AMBULATORY_CARE_PROVIDER_SITE_OTHER): Payer: HMO | Admitting: Psychiatry

## 2020-05-31 ENCOUNTER — Encounter: Payer: Self-pay | Admitting: Psychiatry

## 2020-05-31 DIAGNOSIS — F5105 Insomnia due to other mental disorder: Secondary | ICD-10-CM

## 2020-05-31 DIAGNOSIS — F3341 Major depressive disorder, recurrent, in partial remission: Secondary | ICD-10-CM | POA: Insufficient documentation

## 2020-05-31 DIAGNOSIS — R809 Proteinuria, unspecified: Secondary | ICD-10-CM | POA: Diagnosis not present

## 2020-05-31 DIAGNOSIS — F3342 Major depressive disorder, recurrent, in full remission: Secondary | ICD-10-CM

## 2020-05-31 DIAGNOSIS — F411 Generalized anxiety disorder: Secondary | ICD-10-CM

## 2020-05-31 DIAGNOSIS — E1029 Type 1 diabetes mellitus with other diabetic kidney complication: Secondary | ICD-10-CM | POA: Diagnosis not present

## 2020-05-31 DIAGNOSIS — Z430 Encounter for attention to tracheostomy: Secondary | ICD-10-CM | POA: Diagnosis not present

## 2020-05-31 MED ORDER — TRAZODONE HCL 100 MG PO TABS: 50.0000 mg | ORAL_TABLET | Freq: Every evening | ORAL | 0 refills | Status: DC | PRN

## 2020-05-31 NOTE — Progress Notes (Signed)
Virtual Visit via Video Note  I connected with [name] on 05/31/20 at  3:20 PM EST by a video enabled telemedicine application and verified that I am speaking with the correct person using two identifiers.  Location Provider Location : [facility] Patient Location : Home  Participants: Patient , Provider    I discussed the limitations of evaluation and management by telemedicine and the availability of in person appointments. The patient expressed understanding and agreed to proceed.   I discussed the assessment and treatment plan with the patient. The patient was provided an opportunity to ask questions and all were answered. The patient agreed with the plan and demonstrated an understanding of the instructions.   The patient was advised to call back or seek an in-person evaluation if the symptoms worsen or if the condition fails to improve as anticipated.  [place] MD OP Progress Note  05/31/2020 3:43 PM [name]  MRN:  [id number]  Chief Complaint:  Chief Complaint    Follow-up     HPI: [name] is a 60 year old Caucasian male, married, lives in [place], has a history of MDD, GAD, insomnia, history of tracheal stenosis, status post multiple reintubation, respiratory failure due to opioid overdose, history of CVA, right-sided hemiparesis, currently resolved, diabetes, hypertension, NSTEMI, hypothyroidism, chronic pain was evaluated by telemedicine today.  Patient today reports he is currently struggling with upper respiratory tract infection.  He got tested for COVID-19 however it came back negative.  He struggles with congestion and tiredness.  He reports his wife also has it.  However overall he is doing okay otherwise.  Denies any significant depression or anxiety symptoms.  Patient reports sleep is good.  He is compliant on medications.  Denies side effects.  Patient reports he continues to be in psychotherapy sessions and reports therapy sessions as  beneficial  Visit Diagnosis:    ICD-10-CM   1. MDD (major depressive disorder), recurrent, in full remission ([place])  F33.42   2. GAD (generalized anxiety disorder)  F41.1 traZODone (DESYREL) 100 MG tablet  3. Insomnia due to mental condition  F51.05     Past Psychiatric History: I have reviewed past psychiatric history from my progress note on 01/15/2019.  Past trials of fluoxetine, Rexulti, Xanax, Abilify.  Patient recently completed TMS-04/05/2020.  Patient had multiple [facility] session in the past.  He also has a history of ECT however did not tolerate it well.  Patient with history of multiple inpatient mental health admissions in 2020.  Past Medical History:  Past Medical History:  Diagnosis Date  . Depression   . Diabetes (HCC)    Insulin Pump  . Diabetes mellitus type I ([place])   . Diabetes mellitus without complication ([place])   . GERD (gastroesophageal reflux disease)   . H/O laryngectomy   . Heel bone fracture   . Hyperlipidemia   . Hypertension   . Radicular pain of right lower extremity   . Stroke ([place])   . Suicide attempt [facility]) 2014   damaged larynx - tracheostomy  . Thyroid disease     Past Surgical History:  Procedure Laterality Date  . COLONOSCOPY WITH PROPOFOL N/A 05/15/2018   Procedure: COLONOSCOPY WITH PROPOFOL;  Surgeon: [name], [name], MD;  Location: [facility] ENDOSCOPY;  Service: Gastroenterology;  Laterality: N/A;  . ESOPHAGOGASTRODUODENOSCOPY N/A 05/15/2018   Procedure: ESOPHAGOGASTRODUODENOSCOPY (EGD);  Surgeon: [name], [name], MD;  Location: [facility] ENDOSCOPY;  Service: Gastroenterology;  Laterality: N/A;  . FRACTURE SURGERY    . Heel bone  reconstruction Left   . HERNIA REPAIR  [id number]   Umbilical hernia repair   . LARYNGECTOMY    . NECK SURGERY     fusion  . SPINE SURGERY    . TRACHEOSTOMY  2014   from [place] attempt    Family Psychiatric History: I have reviewed family psychiatric history from my progress note on 01/15/2019  Family History:  Family History   Problem Relation Age of Onset  . Osteoporosis Mother   . Diabetes Mother   . Hypertension Father   . Mental illness Neg Hx     Social History: Reviewed social history from my progress note on 01/15/2019 Social History   Socioeconomic History  . Marital status: Married    Spouse name: Not on file  . Number of children: Not on file  . Years of education: Not on file  . Highest education level: Not on file  Occupational History  . Not on file  Tobacco Use  . Smoking status: Former Smoker    Packs/day: 0.00    Types: Cigarettes    Quit date: 04/09/2013    Years since quitting: 7.1  . Smokeless tobacco: Never Used  Vaping Use  . Vaping Use: Never used  Substance and Sexual Activity  . Alcohol use: Yes    Alcohol/week: 2.0 standard drinks    Types: 2 Shots of liquor per week    Comment: rare  . Drug use: No    Comment: Pt denied; UDS not available  . Sexual activity: Yes    Partners: Female    Birth control/protection: Condom  Other Topics Concern  . Not on file  Social History Narrative  . Not on file   Social Determinants of Health   Financial Resource Strain: Not on file  Food Insecurity: Not on file  Transportation Needs: Not on file  Physical Activity: Not on file  Stress: Not on file  Social Connections: Not on file    Allergies:  Allergies  Allergen Reactions  . Buspar [Buspirone]     Makes the patient "flip out"  . Depakote [Valproic Acid]     Causes excessive drowsiness  . Clopidogrel Rash  . Gabapentin Itching and Rash    Metabolic Disorder Labs: Lab Results  Component Value Date   HGBA1C 7.8 (H) 01/01/2019   MPG 177.16 01/01/2019   MPG 174.29 12/23/2018   No results found for: PROLACTIN Lab Results  Component Value Date   CHOL 161 09/15/2016   TRIG 190 (H) 09/15/2016   HDL 41 09/15/2016   CHOLHDL 3.9 09/15/2016   VLDL 38 09/15/2016   LDLCALC 82 09/15/2016   Lab Results  Component Value Date   TSH 1.952 09/15/2016    Therapeutic  Level Labs: No results found for: LITHIUM Lab Results  Component Value Date   VALPROATE 19 (L) 09/12/2016   No components found for:  CBMZ  Current Medications: Current Outpatient Medications  Medication Sig Dispense Refill  . amLODipine (NORVASC) 5 MG tablet Take 1 tablet (5 mg total) by mouth daily. 30 tablet 1  . aspirin EC 81 MG tablet Take 1 tablet (81 mg total) by mouth daily. 30 tablet 1  . buPROPion (WELLBUTRIN XL) 300 MG 24 hr tablet Take 1 tablet (300 mg total) by mouth daily. 90 tablet 0  . ciprofloxacin (CIPRO) 250 MG tablet Take 250 mg by mouth 2 (two) times daily.    . clonazePAM (KLONOPIN) 1 MG tablet Take 0.5-1 tablets (0.5-1 mg total) by mouth daily as  needed for anxiety. For severe anxiety attacks 30 tablet 1  . Continuous Blood Gluc Receiver (FREESTYLE LIBRE 14 DAY READER) DEVI FreeStyle Libre 14 Day Reader    . Continuous Blood Gluc Sensor (FREESTYLE LIBRE 14 DAY SENSOR) MISC FreeStyle Libre 14 Day Sensor kit    . empagliflozin (JARDIANCE) 25 MG TABS tablet Take 25 mg by mouth daily. 30 tablet 1  . FLUCELVAX QUADRIVALENT 0.5 ML injection     . glycopyrrolate (ROBINUL) 1 MG tablet Take by mouth.    . insulin aspart (NOVOLOG) 100 UNIT/ML injection INJECT UP TO 120 UNITS UNDER THE SKIN AS DIRECTED DAILY VIA INSULIN PUMP    . levofloxacin (LEVAQUIN) 750 MG tablet Take 750 mg by mouth daily.    Marland Kitchen levothyroxine (SYNTHROID) 75 MCG tablet TAKE 1 TABLET BY MOUTH DAILY AT 6 AM 90 tablet 0  . losartan-hydrochlorothiazide (HYZAAR) 100-25 MG tablet TAKE 1 TABLET BY MOUTH EVERY DAY    . OLANZapine (ZYPREXA) 10 MG tablet TAKE 1 TABLET(10 MG) BY MOUTH AT BEDTIME 30 tablet 1  . pantoprazole (PROTONIX) 40 MG tablet TAKE 1 TABLET(40 MG) BY MOUTH TWICE DAILY    . rosuvastatin (CRESTOR) 20 MG tablet Take 20 mg by mouth at bedtime.    . simvastatin (ZOCOR) 20 MG tablet TAKE 1 TABLET(20 MG) BY MOUTH EVERY NIGHT    . tobramycin, PF, (TOBI) 300 MG/5ML nebulizer solution     . traZODone  (DESYREL) 100 MG tablet Take 0.5-1 tablets (50-100 mg total) by mouth at bedtime as needed. for sleep 180 tablet 0  . venlafaxine XR (EFFEXOR-XR) 37.5 MG 24 hr capsule TAKE 1 CAPSULE(37.5 MG) BY MOUTH DAILY WITH BREAKFAST 30 capsule 1   No current facility-administered medications for this visit.     Musculoskeletal: Strength & Muscle Tone: UTA Gait & Station: UTA Patient leans: N/A  Psychiatric Specialty Exam: Review of Systems  HENT: Positive for congestion.   Psychiatric/Behavioral: The patient is nervous/anxious.   All other systems reviewed and are negative.   There were no vitals taken for this visit.There is no height or weight on file to calculate BMI.  General Appearance: Casual  Eye Contact:  Fair  Speech:  Slow - S/P tracheostomy  Volume:  Normal  Mood:  Anxious improving  Affect:  Congruent  Thought Process:  Goal Directed and Descriptions of Associations: Intact  Orientation:  Full (Time, Place, and Person)  Thought Content: Logical   Suicidal Thoughts:  No  Homicidal Thoughts:  No  Memory:  Immediate;   Fair Recent;   Fair Remote;   Fair  Judgement:  Fair  Insight:  Fair  Psychomotor Activity:  Normal  Concentration:  Concentration: Fair and Attention Span: Fair  Recall:  [employer] of Knowledge: Fair  Language: Fair  Akathisia:  No  Handed:  Right  AIMS (if indicated): UTA  Assets:  Communication Skills Desire for Improvement Housing Social Support  ADL's:  Intact  Cognition: WNL  Sleep:  Fair   Screenings: AUDIT   Flowsheet Row Admission (Discharged) from 01/22/2019 in [facility] Admission (Discharged) from 12/31/2018 in [place] Admission (Discharged) from 09/14/2016 in [facility]  Alcohol Use Disorder Identification Test Final Score (AUDIT) 0 0 1    ECT-MADRS   Flowsheet Row Admission (Discharged) from 01/22/2019 in [place] Total Score 27     Mini-Mental   [place] Admission (Discharged) from 01/22/2019 in [facility] [facility]  Total Score (  max 30 points ) 30    PHQ2-9   Flowsheet Row Video Visit from 05/11/2020 in [place] ED from 04/28/2020 in [facility] Video Visit from 09/17/2019 in [place] Office Visit from 06/11/2019 in [facility] [place] Office Visit from 03/13/2019 in [place]  PHQ-2 Total Score _0 PHQ-9 Total Score _1 -- 16       Assessment and Plan: Kailon Treese is a 60 year old Caucasian male, married, on disability, history of multiple medical problems MDD, GAD was evaluated by telemedicine today.  Patient is biologically predisposed given his multiple health issues.  Patient with psychosocial stressors of the pandemic, his own health issues is currently making progress with regards to his mood.  Plan as noted below.  Plan MDD in remission Wellbutrin XL 300 mg p.o. daily Continue Zyprexa 10 mg p.o. nightly Trazodone 50 to 100 mg p.o. nightly as needed  GAD-improving Venlafaxine extended release 37.5 mg p.o. daily Klonopin 0.5-1 mg p.o. daily as needed for severe anxiety attacks only.  Patient is limiting use. Continue CBT  Insomnia-improving Trazodone and Zyprexa as prescribed Klonopin 0.5-1 mg p.o. daily as needed for severe anxiety.  [employer] testing results were reviewed while making medication changes.  Patient to follow-up with his primary care provider as needed for his upper respiratory tract infection symptoms.  Follow-up in clinic in 3 to 4 weeks or sooner if needed.  I have spent atleast 20 minutes face to face by video with patient today. More than 50 % of the time was spent for preparing to see the patient ( e.g., review of test, records ), ordering medications and test ,psychoeducation and supportive psychotherapy  and care coordination,as well as documenting clinical information in electronic health record. This note was generated in part or whole with voice recognition software. Voice recognition is usually quite accurate but there are transcription errors that can and very often do occur. I apologize for any typographical errors that were not detected and corrected.         Ursula Alert, MD 06/01/2020, 9:27 AM

## 2020-06-01 ENCOUNTER — Ambulatory Visit (INDEPENDENT_AMBULATORY_CARE_PROVIDER_SITE_OTHER): Payer: HMO | Admitting: Clinical

## 2020-06-01 DIAGNOSIS — F332 Major depressive disorder, recurrent severe without psychotic features: Secondary | ICD-10-CM

## 2020-06-01 DIAGNOSIS — F411 Generalized anxiety disorder: Secondary | ICD-10-CM

## 2020-06-01 NOTE — Progress Notes (Signed)
  Virtual Visit via Video Note  I connected with [name] on 06/01/20 at  2:00 PM EST by a video enabled telemedicine application and verified that I am speaking with the correct person using two identifiers.  Location: Patient: Home  Provider: Office   I discussed the limitations of evaluation and management by telemedicine and the availability of in person appointments. The patient expressed understanding and agreed to proceed.   THERAPIST PROGRESS NOTE  Session Time:2:00PM-2:30PM  Participation Level:Active  Behavioral Response:CasualAlertIrratible  Type of Therapy:Individual Therapy  Treatment Goals addressed:Coping  Interventions:CBT, Solution Focused, Strength-based and Supportive  Summary:[name] a 60 y.o.malewho presents with Major Depression and Generalized Anxiety.The OPT therapist worked with thepatientfor his ongoing [place] treatment. The OPT therapist utilized Motivational Interviewing to assist in creating therapeutic repore. The patient in the session was engaged and work in collaboration giving feedback about his triggers and symptoms over the past few weeksincludingongoing management with his step-daughter continuing to be at the home and not yet finding a place to move out to yet, car difficulty, and frustration with his health plan and cost of his insulin. The OPT therapist utilized Cognitive Behavioral Therapy through cognitive restructuring as well as worked with the patient on coping strategies to assist in management of Anxietyand Depression. The OPT therapist inquired for holistic care about the patients adherence to medication therapy.The patient spoke about his recent Dr. Shea Evans  and ongoing improve having more energy, higher activity level, and good interaction with his significant other.  Suicidal/Homicidal:Nowithout intent/plan  Therapist Response:The OPT therapist worked with the patient for the patients  scheduled session. The patient was engaged in his session and gave feedback in relation to triggers, symptoms, and behavior responses over the past fewweeks. The OPT therapist worked with the patient utilizing an in session Cognitive Behavioral Therapy exercise. The patient was responsive in the session and verbalized, " The medication has definitely continued to help I am managing my home situation better and not letting it get to me as much".The patient noted ongoing improvement at home in his interactions with his wife. The OPT therapist will continue treatment work with the patient in his next scheduled session.  Plan: Return again in2/3weeks.  Diagnosis:Axis I:Generalized Anxiety Disorder and Major Depression, Recurrent severe w/o psy features  Axis II:No diagnosis   I discussed the assessment and treatment plan with the patient. The patient was provided an opportunity to ask questions and all were answered. The patient agreed with the plan and demonstrated an understanding of the instructions.  The patient was advised to call back or seek an in-person evaluation if the symptoms worsen or if the condition fails to improve as anticipated.  I provided53minutes of non-face-to-face time during this encounter.   Joel Hides, LCSW  06/01/2020

## 2020-06-22 ENCOUNTER — Other Ambulatory Visit: Payer: Self-pay

## 2020-06-22 ENCOUNTER — Telehealth (INDEPENDENT_AMBULATORY_CARE_PROVIDER_SITE_OTHER): Payer: HMO | Admitting: Psychiatry

## 2020-06-22 DIAGNOSIS — Z91199 Patient's noncompliance with other medical treatment and regimen due to unspecified reason: Secondary | ICD-10-CM | POA: Insufficient documentation

## 2020-06-22 DIAGNOSIS — Z5329 Procedure and treatment not carried out because of patient's decision for other reasons: Secondary | ICD-10-CM

## 2020-06-22 NOTE — Progress Notes (Signed)
No response to call or text or video invite  

## 2020-07-01 DIAGNOSIS — Z430 Encounter for attention to tracheostomy: Secondary | ICD-10-CM | POA: Diagnosis not present

## 2020-07-02 ENCOUNTER — Other Ambulatory Visit: Payer: Self-pay

## 2020-07-02 ENCOUNTER — Telehealth (INDEPENDENT_AMBULATORY_CARE_PROVIDER_SITE_OTHER): Payer: HMO | Admitting: Psychiatry

## 2020-07-02 ENCOUNTER — Encounter: Payer: Self-pay | Admitting: Psychiatry

## 2020-07-02 DIAGNOSIS — F411 Generalized anxiety disorder: Secondary | ICD-10-CM

## 2020-07-02 DIAGNOSIS — F3342 Major depressive disorder, recurrent, in full remission: Secondary | ICD-10-CM | POA: Diagnosis not present

## 2020-07-02 DIAGNOSIS — F5105 Insomnia due to other mental disorder: Secondary | ICD-10-CM

## 2020-07-02 MED ORDER — CLONAZEPAM 1 MG PO TABS: 0.5000 mg | ORAL_TABLET | Freq: Every day | ORAL | 1 refills | Status: DC | PRN

## 2020-07-02 MED ORDER — VENLAFAXINE HCL ER 37.5 MG PO CP24
ORAL_CAPSULE | ORAL | 1 refills | Status: DC
Start: 2020-07-02 — End: 2020-08-26

## 2020-07-02 NOTE — Progress Notes (Signed)
Virtual Visit via Video Note  I connected with [name] on 07/02/20 at 11:20 AM EST by a video enabled telemedicine application and verified that I am speaking with the correct person using two identifiers.  Location Provider Location : [facility] Patient Location : Home  Participants: Patient , Provider    I discussed the limitations of evaluation and management by telemedicine and the availability of in person appointments. The patient expressed understanding and agreed to proceed.   I discussed the assessment and treatment plan with the patient. The patient was provided an opportunity to ask questions and all were answered. The patient agreed with the plan and demonstrated an understanding of the instructions.   The patient was advised to call back or seek an in-person evaluation if the symptoms worsen or if the condition fails to improve as anticipated.   [name] MD OP Progress Note  07/02/2020 9:25 PM [name]  MRN:  [id number]  Chief Complaint:  Chief Complaint    Follow-up     HPI: [name] is a 60 year old Caucasian male, married, lives in [place], has a history of MDD, GAD, insomnia, history of tracheal stenosis status post multiple reintubation, respiratory failure due to opioid overdose, history of CVA, right-sided hemiparesis, diabetes, hypertension, NSTEMI, hypothyroidism, chronic pain was evaluated by telemedicine today.  Patient today reports he is currently doing well.  Denies any significant depression.  He had couple of days when he felt anxious last week however he was able to cope with it.  Patient reports sleep is overall okay.  He is compliant on medications.  Denies side effects.  Patient continues to be in psychotherapy sessions and reports therapy sessions are beneficial.  Patient denies any other concerns today.  Visit Diagnosis:    ICD-10-CM   1. MDD (major depressive disorder), recurrent, in full remission ([name])  F33.42   2.  GAD (generalized anxiety disorder)  F41.1 venlafaxine XR (EFFEXOR-XR) 37.5 MG 24 hr capsule    clonazePAM (KLONOPIN) 1 MG tablet  3. Insomnia due to mental condition  F51.05     Past Psychiatric History: I have reviewed past psychiatric history from my progress note on 01/15/2019.  Past trials of fluoxetine, Rexulti, Xanax, Abilify.  Patient recently completed TMS-04/05/2020.  Patient with multiple [place] sessions in the past.  Patient also with history of ECT however did not tolerate it well.  Patient with multiple inpatient mental health admissions in 2020.  Patient reports last suicide attempt was in 2014.  Past Medical History:  Past Medical History:  Diagnosis Date  . Depression   . Diabetes (HCC)    Insulin Pump  . Diabetes mellitus type I ([place])   . Diabetes mellitus without complication ([place])   . GERD (gastroesophageal reflux disease)   . H/O laryngectomy   . Heel bone fracture   . Hyperlipidemia   . Hypertension   . Radicular pain of right lower extremity   . Stroke ([place])   . Suicide attempt [facility]) 2014   damaged larynx - tracheostomy  . Thyroid disease     Past Surgical History:  Procedure Laterality Date  . COLONOSCOPY WITH PROPOFOL N/A 05/15/2018   Procedure: COLONOSCOPY WITH PROPOFOL;  Surgeon: [name], [name], MD;  Location: [facility] ENDOSCOPY;  Service: Gastroenterology;  Laterality: N/A;  . ESOPHAGOGASTRODUODENOSCOPY N/A 05/15/2018   Procedure: ESOPHAGOGASTRODUODENOSCOPY (EGD);  Surgeon: [name], [name], MD;  Location: [facility] ENDOSCOPY;  Service: Gastroenterology;  Laterality: N/A;  . FRACTURE SURGERY    . Heel bone  reconstruction Left   . HERNIA REPAIR  [id number]   Umbilical hernia repair   . LARYNGECTOMY    . NECK SURGERY     fusion  . SPINE SURGERY    . TRACHEOSTOMY  2014   from [place] attempt    Family Psychiatric History: I have reviewed family psychiatric history from my progress note on 01/15/2019  Family History:  Family History  Problem Relation Age of Onset  .  Osteoporosis Mother   . Diabetes Mother   . Hypertension Father   . Mental illness Neg Hx     Social History: Reviewed social history from my progress note on 01/15/2019 Social History   Socioeconomic History  . Marital status: Married    Spouse name: Not on file  . Number of children: Not on file  . Years of education: Not on file  . Highest education level: Not on file  Occupational History  . Not on file  Tobacco Use  . Smoking status: Former Smoker    Packs/day: 0.00    Types: Cigarettes    Quit date: 04/09/2013    Years since quitting: 7.2  . Smokeless tobacco: Never Used  Vaping Use  . Vaping Use: Never used  Substance and Sexual Activity  . Alcohol use: Yes    Alcohol/week: 2.0 standard drinks    Types: 2 Shots of liquor per week    Comment: rare  . Drug use: No    Comment: Pt denied; UDS not available  . Sexual activity: Yes    Partners: Female    Birth control/protection: Condom  Other Topics Concern  . Not on file  Social History Narrative  . Not on file   Social Determinants of Health   Financial Resource Strain: Not on file  Food Insecurity: Not on file  Transportation Needs: Not on file  Physical Activity: Not on file  Stress: Not on file  Social Connections: Not on file    Allergies:  Allergies  Allergen Reactions  . Buspar [Buspirone]     Makes the patient "flip out"  . Depakote [Valproic Acid]     Causes excessive drowsiness  . Clopidogrel Rash  . Gabapentin Itching and Rash    Metabolic Disorder Labs: Lab Results  Component Value Date   HGBA1C 7.8 (H) 01/01/2019   MPG 177.16 01/01/2019   MPG 174.29 12/23/2018   No results found for: PROLACTIN Lab Results  Component Value Date   CHOL 161 09/15/2016   TRIG 190 (H) 09/15/2016   HDL 41 09/15/2016   CHOLHDL 3.9 09/15/2016   VLDL 38 09/15/2016   LDLCALC 82 09/15/2016   Lab Results  Component Value Date   TSH 1.952 09/15/2016    Therapeutic Level Labs: No results found for:  LITHIUM Lab Results  Component Value Date   VALPROATE 19 (L) 09/12/2016   No components found for:  CBMZ  Current Medications: Current Outpatient Medications  Medication Sig Dispense Refill  . amLODipine (NORVASC) 5 MG tablet Take 1 tablet (5 mg total) by mouth daily. 30 tablet 1  . aspirin EC 81 MG tablet Take 1 tablet (81 mg total) by mouth daily. 30 tablet 1  . buPROPion (WELLBUTRIN XL) 300 MG 24 hr tablet Take 1 tablet (300 mg total) by mouth daily. 90 tablet 0  . ciprofloxacin (CIPRO) 250 MG tablet Take 250 mg by mouth 2 (two) times daily.    . clonazePAM (KLONOPIN) 1 MG tablet Take 0.5-1 tablets (0.5-1 mg total) by mouth daily as  needed for anxiety. For severe anxiety attacks 30 tablet 1  . Continuous Blood Gluc Receiver (FREESTYLE LIBRE 14 DAY READER) DEVI FreeStyle Libre 14 Day Reader    . Continuous Blood Gluc Sensor (FREESTYLE LIBRE 14 DAY SENSOR) MISC FreeStyle Libre 14 Day Sensor kit    . empagliflozin (JARDIANCE) 25 MG TABS tablet Take 25 mg by mouth daily. 30 tablet 1  . FLUCELVAX QUADRIVALENT 0.5 ML injection     . glycopyrrolate (ROBINUL) 1 MG tablet Take by mouth.    . insulin aspart (NOVOLOG) 100 UNIT/ML injection INJECT UP TO 120 UNITS UNDER THE SKIN AS DIRECTED DAILY VIA INSULIN PUMP    . levofloxacin (LEVAQUIN) 750 MG tablet Take 750 mg by mouth daily.    Marland Kitchen levothyroxine (SYNTHROID) 75 MCG tablet TAKE 1 TABLET BY MOUTH DAILY AT 6 AM 90 tablet 0  . losartan-hydrochlorothiazide (HYZAAR) 100-25 MG tablet TAKE 1 TABLET BY MOUTH EVERY DAY    . OLANZapine (ZYPREXA) 10 MG tablet TAKE 1 TABLET(10 MG) BY MOUTH AT BEDTIME 30 tablet 1  . pantoprazole (PROTONIX) 40 MG tablet TAKE 1 TABLET(40 MG) BY MOUTH TWICE DAILY    . rosuvastatin (CRESTOR) 20 MG tablet Take 20 mg by mouth at bedtime.    . simvastatin (ZOCOR) 20 MG tablet TAKE 1 TABLET(20 MG) BY MOUTH EVERY NIGHT    . tobramycin, PF, (TOBI) 300 MG/5ML nebulizer solution     . traZODone (DESYREL) 100 MG tablet Take 0.5-1  tablets (50-100 mg total) by mouth at bedtime as needed. for sleep 180 tablet 0  . venlafaxine XR (EFFEXOR-XR) 37.5 MG 24 hr capsule TAKE 1 CAPSULE(37.5 MG) BY MOUTH DAILY WITH BREAKFAST 30 capsule 1   No current facility-administered medications for this visit.     Musculoskeletal: Strength & Muscle Tone: UTA Gait & Station: UTA Patient leans: N/A  Psychiatric Specialty Exam: Review of Systems  Psychiatric/Behavioral: The patient is nervous/anxious.   All other systems reviewed and are negative.   There were no vitals taken for this visit.There is no height or weight on file to calculate BMI.  General Appearance: Casual  Eye Contact:  Fair  Speech:  Slow S/P trachesotomy  Volume:  Decreased  Mood:  Anxious coping well  Affect:  Congruent  Thought Process:  Goal Directed and Descriptions of Associations: Intact  Orientation:  Full (Time, Place, and Person)  Thought Content: Logical   Suicidal Thoughts:  No  Homicidal Thoughts:  No  Memory:  Immediate;   Fair Recent;   Fair Remote;   Fair  Judgement:  Fair  Insight:  Fair  Psychomotor Activity:  Normal  Concentration:  Concentration: Fair and Attention Span: Fair  Recall:  [employer] of Knowledge: Fair  Language: Fair  Akathisia:  No  Handed:  Right  AIMS (if indicated): UTA  Assets:  Desire for Improvement Housing Social Support  ADL's:  Intact  Cognition: WNL  Sleep:  Fair   Screenings: AUDIT   Flowsheet Row Admission (Discharged) from 01/22/2019 in [place] Admission (Discharged) from 12/31/2018 in [place] Admission (Discharged) from 09/14/2016 in [facility]  Alcohol Use Disorder Identification Test Final Score (AUDIT) 0 0 1    ECT-MADRS   Flowsheet Row Admission (Discharged) from 01/22/2019 in [place] Total Score 27    Mini-Mental   [place] Admission (Discharged) from 01/22/2019 in [place]  Total Score (max 30 points ) 30  PHQ2-9   Flowsheet Row Video Visit from 07/02/2020 in [place] Video Visit from 05/11/2020 in [place] ED from 04/28/2020 in [facility] Video Visit from 09/17/2019 in [place] Office Visit from 06/11/2019 in [place]  PHQ-2 Total Score _0 PHQ-9 Total Score - _1 -    Flowsheet Row Video Visit from 07/02/2020 in [place] ED from 04/28/2020 in [facility] Admission (Discharged) from 01/22/2019 in [place] Low Risk High Risk Error: Q3, 4, or 5 should not be populated when Q2 is No       Assessment and Plan: Ching Rabideau is a 60 year old Caucasian male, married, on disability, history of multiple medical problems MDD, GAD was evaluated by telemedicine today.  Patient is biologically predisposed given his multiple health issues.  Patient with psychosocial stressors of the pandemic, his own health issues currently stable with regards to his mood.  Plan as noted below.  Plan MDD in remission Wellbutrin XL 300 mg p.o. daily Zyprexa 10 mg p.o. nightly Trazodone 50 to 100 mg p.o. nightly as needed  GAD-stable Venlafaxine extended release 37.5 mg p.o. daily Klonopin 0.5-1 mg p.o. daily as needed for severe anxiety attacks only. Continue CBT  Insomnia-stable Trazodone and Zyprexa as prescribed Klonopin 0.5-1 mg p.o. nightly as needed  [employer] testing results were reviewed while making medication changes.  Follow-up in clinic in 1 month or sooner if needed.  I have spent atleast 20 minutes face to face by video with patient today. More than 50 % of the time was spent for preparing to see the patient ( e.g., review of test, records ),  ordering medications  and test ,psychoeducation and supportive psychotherapy and care coordination,as well as documenting clinical information in electronic health record. This note was generated in part or whole with voice recognition software. Voice recognition is usually quite accurate but there are transcription errors that can and very often do occur. I apologize for any typographical errors that were not detected and corrected.       Ursula Alert, MD 07/02/2020, 9:25 PM

## 2020-07-05 DIAGNOSIS — Z430 Encounter for attention to tracheostomy: Secondary | ICD-10-CM | POA: Diagnosis not present

## 2020-07-07 ENCOUNTER — Telehealth (HOSPITAL_COMMUNITY): Payer: Self-pay | Admitting: Clinical

## 2020-07-07 ENCOUNTER — Ambulatory Visit (HOSPITAL_COMMUNITY): Payer: HMO | Admitting: Clinical

## 2020-07-07 ENCOUNTER — Other Ambulatory Visit: Payer: Self-pay

## 2020-07-07 DIAGNOSIS — R809 Proteinuria, unspecified: Secondary | ICD-10-CM | POA: Diagnosis not present

## 2020-07-07 DIAGNOSIS — E1029 Type 1 diabetes mellitus with other diabetic kidney complication: Secondary | ICD-10-CM | POA: Diagnosis not present

## 2020-07-07 NOTE — Telephone Encounter (Signed)
Pt did not respond to video link, phone call, or VM 

## 2020-08-03 ENCOUNTER — Ambulatory Visit (INDEPENDENT_AMBULATORY_CARE_PROVIDER_SITE_OTHER): Payer: HMO | Admitting: Psychiatry

## 2020-08-03 ENCOUNTER — Other Ambulatory Visit: Payer: Self-pay

## 2020-08-03 ENCOUNTER — Encounter: Payer: Self-pay | Admitting: Psychiatry

## 2020-08-03 VITALS — BP 145/78 | HR 89 | Temp 98.6°F | Wt 287.0 lb

## 2020-08-03 DIAGNOSIS — F3342 Major depressive disorder, recurrent, in full remission: Secondary | ICD-10-CM | POA: Diagnosis not present

## 2020-08-03 DIAGNOSIS — F5101 Primary insomnia: Secondary | ICD-10-CM | POA: Insufficient documentation

## 2020-08-03 DIAGNOSIS — F411 Generalized anxiety disorder: Secondary | ICD-10-CM | POA: Diagnosis not present

## 2020-08-03 MED ORDER — BUPROPION HCL ER (XL) 300 MG PO TB24: 300.0000 mg | ORAL_TABLET | Freq: Every day | ORAL | 0 refills | Status: DC

## 2020-08-03 MED ORDER — BELSOMRA 10 MG PO TABS: 5.0000 mg | ORAL_TABLET | Freq: Every evening | ORAL | 1 refills | Status: DC | PRN

## 2020-08-03 MED ORDER — OLANZAPINE 10 MG PO TABS: 10.0000 mg | ORAL_TABLET | Freq: Every day | ORAL | 1 refills | Status: DC

## 2020-08-03 NOTE — Patient Instructions (Signed)
Suvorexant oral tablets What is this medicine? SUVOREXANT (su-vor-EX-ant) is used to treat insomnia. This medicine helps you to fall asleep and sleep through the night. This medicine may be used for other purposes; ask your health care provider or pharmacist if you have questions. COMMON BRAND NAME(S): Belsomra What should I tell my health care provider before I take this medicine? They need to know if you have any of these conditions:  depression  drink alcohol  drug abuse or addiction  feel sleepy or have fallen asleep suddenly during the day  history of a sudden onset of muscle weakness (cataplexy)  liver disease  lung or breathing disease, like asthma or emphysema  sleep apnea  suicidal thoughts, plans, or attempt; a previous suicide attempt by you or a family member  an unusual or allergic reaction to suvorexant, other medicines, foods, dyes, or preservatives  pregnant or trying to get pregnant  breast-feeding How should I use this medicine? Take this medicine by mouth within 30 minutes of going to bed. Do not take it unless you are able to stay in bed a full night before you must be active again. Follow the directions on the prescription label. You may take this medicine with or without a food. However, this medicine may take longer to work if you take it with or right after meals. Do not take your medicine more often than directed. Do not stop taking this medicine on your own. Always follow your doctor or health care professional's advice. A special MedGuide will be given to you by the pharmacist with each prescription and refill. Be sure to read this information carefully each time. Talk to your pediatrician regarding the use of this medicine in children. Special care may be needed. Overdosage: If you think you have taken too much of this medicine contact a poison control center or emergency room at once. NOTE: This medicine is only for you. Do not share this medicine with  others. What if I miss a dose? This medicine should only be taken immediately before going to sleep. Do not take double or extra doses. What may interact with this medicine?  alcohol  antihistamines for allergy, cough, or cold  aprepitant  boceprevir  certain antibiotics like ciprofloxacin, clarithromycin, erythromycin, telithromycin  certain antivirals for HIV or AIDS  certain medicines for anxiety or sleep  certain medicines for depression like amitriptyline, fluoxetine, nefazodone, sertraline  certain medicines for fungal infections like ketoconazole, posaconazole, fluconazole, itraconazole  certain medicines for seizures like carbamazepine, phenobarbital, primidone, phenytoin  conivaptan  digoxin  diltiazem  general anesthetics like halothane, isoflurane, methoxyflurane, propofol  grapefruit juice  imatinib  medicines that relax muscles for surgery  narcotic medicines for pain  phenothiazines like chlorpromazine, mesoridazine, prochlorperazine, thioridazine  rifampin  verapamil This list may not describe all possible interactions. Give your health care provider a list of all the medicines, herbs, non-prescription drugs, or dietary supplements you use. Also tell them if you smoke, drink alcohol, or use illegal drugs. Some items may interact with your medicine. What should I watch for while using this medicine? Visit your health care professional for regular checks on your progress. Tell your health care professional if your symptoms do not start to get better or if they get worse. Avoid caffeine-containing drinks in the evening hours. After taking this medicine, you may get up out of bed and do an activity that you do not know you are doing. The next morning, you may have no memory of this.  Activities include driving a car ("sleep-driving"), making and eating food, talking on the phone, sexual activity, and sleep-walking. Serious injuries have occurred. Call your  doctor right away if you find out you have done any of these activities. Do not take this medicine if you have used alcohol that evening. Do not take it if you have taken another medicine for sleep. Do not take this medicine unless you are able to stay in bed for a full night (7 to 8 hours) and do not drive or perform other activities requiring full alertness within 8 hours of a dose. Do not drive, use machinery, or do anything that needs mental alertness the day after you take the 20 mg dose of this medicine. The use of lower doses (10 mg) may also cause driving impairment the next day. You may have a decrease in mental alertness the day after use, even if you feel that you are fully awake. Tell your doctor if you will need to perform activities requiring full alertness, such as driving, the next day. Do not stand or sit up quickly after taking this medicine, especially if you are an older patient. This reduces the risk of dizzy or fainting spells. If you or your family notice any changes in your behavior, such as new or worsening depression, thoughts of harming yourself, anxiety, other unusual or disturbing thoughts, or memory loss, call your health care professional right away. After you stop taking this medicine, you may have trouble falling asleep. This is called rebound insomnia. This problem usually goes away on its own after 1 or 2 nights. What side effects may I notice from receiving this medicine? Side effects that you should report to your doctor or health care professional as soon as possible:  allergic reactions like skin rash, itching or hives, swelling of the face, lips, or tongue  hallucinations  periods of leg weakness lasting from seconds to a few minutes  suicidal thoughts, mood changes  unable to move or speak for several minutes while going to sleep or waking up  unusual activities while not fully awake like driving, eating, making phone calls, or sexual activity Side effects  that usually do not require medical attention (report these to your doctor or health care professional if they continue or are bothersome):  daytime drowsiness  headache  nightmares or abnormal dreams  tiredness This list may not describe all possible side effects. Call your doctor for medical advice about side effects. You may report side effects to FDA at 1-800-FDA-1088. Where should I keep my medicine? Keep out of the reach of children. This medicine can be abused. Keep your medicine in a safe place to protect it from theft. Do not share this medicine with anyone. Selling or giving away this medicine is dangerous and against the law. Store at room temperature between 15 and 30 degrees C (59 and 86 degrees F). Throw away any unused medicine after the expiration date. NOTE: This sheet is a summary. It may not cover all possible information. If you have questions about this medicine, talk to your doctor, pharmacist, or health care provider.  2021 Elsevier/Gold Standard (2018-05-10 16:37:12)

## 2020-08-03 NOTE — Progress Notes (Signed)
[facility] MD OP Progress Note  08/03/2020 5:46 PM [place]  MRN:  [id number]  Chief Complaint:  Chief Complaint    Follow-up     HPI: [name] is a 60 year old Caucasian male, married, lives in [place], has a history of MDD, GAD, insomnia, history of tracheal stenosis status post multiple reintubation, respiratory failure due to opioid overdose, history of CVA, right-sided hemiparesis, diabetes, hypertension,NSTEMI, hypothyroidism, chronic pain was evaluated in person today.  Patient today reports he is currently doing well with regards to his mood.  He does not have any significant depression or anxiety.  He has more good days than bad days.  He has been getting out to walk more often.  He however has not been able to go to the gym due to the [place] pandemic.  Patient however reports he has been having trouble with sleep in spite of being in a good mood.  This has been going on since the past few days.  Patient reports he tried taking a higher dosage of trazodone and that does not seem to help.  Patient is compliant on medications And denies side effects.  Patient denies any suicidality, homicidality or perceptual disturbances.  Patient denies any other concerns today.  Visit Diagnosis:    ICD-10-CM   1. MDD (major depressive disorder), recurrent, in full remission ([place])  F33.42 buPROPion (WELLBUTRIN XL) 300 MG 24 hr tablet  2. GAD (generalized anxiety disorder)  F41.1 OLANZapine (ZYPREXA) 10 MG tablet  3. Primary insomnia  F51.01 Suvorexant (BELSOMRA) 10 MG TABS    Past Psychiatric History: I have reviewed past psychiatric history from my progress note on 01/15/2019.  Past trials of fluoxetine, Rexulti, Xanax, Abilify.  Patient completed TMS-04/05/2020.  Patient with multiple [place] sessions in the past.  Patient also with history of ECT however did not tolerate it well.  Patient with multiple inpatient mental health admissions in 2020.  Patient reports last suicide  attempt in 2014.  Past Medical History:  Past Medical History:  Diagnosis Date  . Depression   . Diabetes (HCC)    Insulin Pump  . Diabetes mellitus type I ([place])   . Diabetes mellitus without complication ([place])   . GERD (gastroesophageal reflux disease)   . H/O laryngectomy   . Heel bone fracture   . Hyperlipidemia   . Hypertension   . Radicular pain of right lower extremity   . Stroke ([place])   . Suicide attempt [facility]) 2014   damaged larynx - tracheostomy  . Thyroid disease     Past Surgical History:  Procedure Laterality Date  . COLONOSCOPY WITH PROPOFOL N/A 05/15/2018   Procedure: COLONOSCOPY WITH PROPOFOL;  Surgeon: [name], [name], MD;  Location: [facility] ENDOSCOPY;  Service: Gastroenterology;  Laterality: N/A;  . ESOPHAGOGASTRODUODENOSCOPY N/A 05/15/2018   Procedure: ESOPHAGOGASTRODUODENOSCOPY (EGD);  Surgeon: [name], [name], MD;  Location: [facility] ENDOSCOPY;  Service: Gastroenterology;  Laterality: N/A;  . FRACTURE SURGERY    . Heel bone reconstruction Left   . HERNIA REPAIR  [id number]   Umbilical hernia repair   . LARYNGECTOMY    . NECK SURGERY     fusion  . SPINE SURGERY    . TRACHEOSTOMY  2014   from [place] attempt    Family Psychiatric History: I have reviewed family psychiatric history from my progress note on 01/15/2019  Family History:  Family History  Problem Relation Age of Onset  . Osteoporosis Mother   . Diabetes Mother   . Hypertension Father   .  Mental illness Neg Hx     Social History: I have reviewed social history from my progress note on 01/15/2019 Social History   Socioeconomic History  . Marital status: Married    Spouse name: Not on file  . Number of children: Not on file  . Years of education: Not on file  . Highest education level: Not on file  Occupational History  . Not on file  Tobacco Use  . Smoking status: Former Smoker    Packs/day: 0.00    Types: Cigarettes    Quit date: 04/09/2013    Years since quitting: 7.3  . Smokeless tobacco:  Never Used  Vaping Use  . Vaping Use: Never used  Substance and Sexual Activity  . Alcohol use: Yes    Alcohol/week: 2.0 standard drinks    Types: 2 Shots of liquor per week    Comment: rare  . Drug use: No    Comment: Pt denied; UDS not available  . Sexual activity: Yes    Partners: Female    Birth control/protection: Condom  Other Topics Concern  . Not on file  Social History Narrative  . Not on file   Social Determinants of Health   Financial Resource Strain: Not on file  Food Insecurity: Not on file  Transportation Needs: Not on file  Physical Activity: Not on file  Stress: Not on file  Social Connections: Not on file    Allergies:  Allergies  Allergen Reactions  . Buspar [Buspirone]     Makes the patient "flip out"  . Depakote [Valproic Acid]     Causes excessive drowsiness  . Clopidogrel Rash  . Gabapentin Itching and Rash    Metabolic Disorder Labs: Lab Results  Component Value Date   HGBA1C 7.8 (H) 01/01/2019   MPG 177.16 01/01/2019   MPG 174.29 12/23/2018   No results found for: PROLACTIN Lab Results  Component Value Date   CHOL 161 09/15/2016   TRIG 190 (H) 09/15/2016   HDL 41 09/15/2016   CHOLHDL 3.9 09/15/2016   VLDL 38 09/15/2016   LDLCALC 82 09/15/2016   Lab Results  Component Value Date   TSH 1.952 09/15/2016    Therapeutic Level Labs: No results found for: LITHIUM Lab Results  Component Value Date   VALPROATE 19 (L) 09/12/2016   No components found for:  CBMZ  Current Medications: Current Outpatient Medications  Medication Sig Dispense Refill  . amLODipine (NORVASC) 5 MG tablet Take 1 tablet (5 mg total) by mouth daily. 30 tablet 1  . clonazePAM (KLONOPIN) 1 MG tablet Take 0.5-1 tablets (0.5-1 mg total) by mouth daily as needed for anxiety. For severe anxiety attacks 30 tablet 1  . empagliflozin (JARDIANCE) 25 MG TABS tablet Take 25 mg by mouth daily. 30 tablet 1  . insulin aspart (NOVOLOG) 100 UNIT/ML injection INJECT UP TO  120 UNITS UNDER THE SKIN AS DIRECTED DAILY VIA INSULIN PUMP    . levothyroxine (SYNTHROID) 75 MCG tablet TAKE 1 TABLET BY MOUTH DAILY AT 6 AM 90 tablet 0  . losartan-hydrochlorothiazide (HYZAAR) 100-25 MG tablet TAKE 1 TABLET BY MOUTH EVERY DAY    . pantoprazole (PROTONIX) 40 MG tablet TAKE 1 TABLET(40 MG) BY MOUTH TWICE DAILY    . Suvorexant (BELSOMRA) 10 MG TABS Take 5-10 mg by mouth at bedtime as needed. 30 tablet 1  . venlafaxine XR (EFFEXOR-XR) 37.5 MG 24 hr capsule TAKE 1 CAPSULE(37.5 MG) BY MOUTH DAILY WITH BREAKFAST 30 capsule 1  . buPROPion (WELLBUTRIN XL)  300 MG 24 hr tablet Take 1 tablet (300 mg total) by mouth daily. 90 tablet 0  . FLUCELVAX QUADRIVALENT 0.5 ML injection     . glycopyrrolate (ROBINUL) 1 MG tablet Take by mouth.    . OLANZapine (ZYPREXA) 10 MG tablet Take 1 tablet (10 mg total) by mouth at bedtime. 30 tablet 1   No current facility-administered medications for this visit.     Musculoskeletal: Strength & Muscle Tone: UTA Gait & Station: normal Patient leans: N/A  Psychiatric Specialty Exam: Review of Systems  Psychiatric/Behavioral: Positive for sleep disturbance.  All other systems reviewed and are negative.   Blood pressure (!) 145/78, pulse 89, temperature 98.6 F (37 C), temperature source Temporal, weight 287 lb (130.2 kg).Body mass index is 38.92 kg/m.  General Appearance: Casual  Eye Contact:  Fair  Speech:  Slow history of tracheostomy  Volume:  Normal  Mood:  Euthymic  Affect:  Congruent  Thought Process:  Goal Directed and Descriptions of Associations: Intact  Orientation:  Full (Time, Place, and Person)  Thought Content: Logical   Suicidal Thoughts:  No  Homicidal Thoughts:  No  Memory:  Immediate;   Fair Recent;   Fair Remote;   Fair  Judgement:  Fair  Insight:  Fair  Psychomotor Activity:  Normal  Concentration:  Concentration: Fair and Attention Span: Fair  Recall:  [employer] of Knowledge: Fair  Language: Fair  Akathisia:  No   Handed:  Right  AIMS (if indicated): done  Assets:  Communication Skills Desire for Improvement Housing Social Support  ADL's:  Intact  Cognition: WNL  Sleep:  Poor   Screenings: [place] Office Visit from 08/03/2020 in [place] Total Score 0    AUDIT   Flowsheet Row Admission (Discharged) from 01/22/2019 in [place] Admission (Discharged) from 12/31/2018 in [place] Admission (Discharged) from 09/14/2016 in [place]  Alcohol Use Disorder Identification Test Final Score (AUDIT) 0 0 1    ECT-MADRS   Flowsheet Row Admission (Discharged) from 01/22/2019 in [place] Total Score 27    Mini-Mental   [place] Admission (Discharged) from 01/22/2019 in [place]  Total Score (max 30 points ) 30    PHQ2-9   [place] Office Visit from 08/03/2020 in [place] Video Visit from 07/02/2020 in [place] Video Visit from 05/11/2020 in [place] ED from 04/28/2020 in [facility] Video Visit from 09/17/2019 in [place]  PHQ-2 Total Score 1 1 2 3 1   PHQ-9 Total Score -- -- 7 20 3     [place] Visit from 08/03/2020 in [place] Video Visit from 07/02/2020 in [place] ED from 04/28/2020 in [place] No Risk Low Risk High Risk       Assessment and Plan: [name] is a 60 year old Caucasian male, married, disability, history of multiple medical problems, MDD, GAD was evaluated by telemedicine today.  Patient is biologically predisposed given his multiple health issues.  Patient with psychosocial stressors of the pandemic, his own health  issues is currently stable with regards to his mood however reports sleep is affected.  Plan as noted below.  Plan MDD in remission Wellbutrin XL 300 mg p.o. daily Zyprexa 10 mg p.o. nightly PHQ 9 today equals  7   GAD-stable Venlafaxine extended release 37.5 mg p.o. daily Klonopin 0.5-1 mg p.o. daily as needed for severe anxiety attacks only Continue CBT  Insomnia-unstable Discontinue trazodone. Start Belsomra 5 to 10 mg p.o. nightly as needed  Patient to continue CBT, we will coordinate care with Mr. [name].  Patient reports he missed a recent appointment and has been unable to schedule another appointment.  Patient to follow-up with primary care provider for elevated blood pressure reading.  Follow-up in clinic in 2 to 3 weeks or sooner if needed.  This note was generated in part or whole with voice recognition software. Voice recognition is usually quite accurate but there are transcription errors that can and very often do occur. I apologize for any typographical errors that were not detected and corrected.        Ursula Alert, MD 08/04/2020, 8:29 AM

## 2020-08-12 ENCOUNTER — Telehealth: Payer: Self-pay

## 2020-08-12 DIAGNOSIS — Z430 Encounter for attention to tracheostomy: Secondary | ICD-10-CM | POA: Diagnosis not present

## 2020-08-12 NOTE — Telephone Encounter (Signed)
pt called left message that the medication changes is not working for him and that he needs something else.

## 2020-08-12 NOTE — Telephone Encounter (Signed)
Returned call to patient, left voicemail.  Advised to call us back.

## 2020-08-13 ENCOUNTER — Telehealth: Payer: Self-pay | Admitting: Psychiatry

## 2020-08-13 NOTE — Telephone Encounter (Signed)
Attempted to contact patient again today to discuss his concern and possibly evaluate patient for further medication management.  Left voicemail to call us back.

## 2020-08-16 ENCOUNTER — Telehealth: Payer: Self-pay | Admitting: Psychiatry

## 2020-08-16 DIAGNOSIS — Z930 Tracheostomy status: Secondary | ICD-10-CM | POA: Diagnosis not present

## 2020-08-16 DIAGNOSIS — I639 Cerebral infarction, unspecified: Secondary | ICD-10-CM | POA: Diagnosis not present

## 2020-08-16 DIAGNOSIS — F5101 Primary insomnia: Secondary | ICD-10-CM

## 2020-08-16 DIAGNOSIS — R220 Localized swelling, mass and lump, head: Secondary | ICD-10-CM | POA: Diagnosis not present

## 2020-08-16 DIAGNOSIS — F3342 Major depressive disorder, recurrent, in full remission: Secondary | ICD-10-CM

## 2020-08-16 DIAGNOSIS — E1029 Type 1 diabetes mellitus with other diabetic kidney complication: Secondary | ICD-10-CM | POA: Diagnosis not present

## 2020-08-16 DIAGNOSIS — J9620 Acute and chronic respiratory failure, unspecified whether with hypoxia or hypercapnia: Secondary | ICD-10-CM | POA: Diagnosis not present

## 2020-08-16 DIAGNOSIS — R809 Proteinuria, unspecified: Secondary | ICD-10-CM | POA: Diagnosis not present

## 2020-08-16 MED ORDER — OLANZAPINE 20 MG PO TABS: 20.0000 mg | ORAL_TABLET | Freq: Every day | ORAL | 1 refills | Status: DC

## 2020-08-16 NOTE — Telephone Encounter (Signed)
Returned call to patient.  He reports he is not sleeping at all.  Interested in going up on Zyprexa.  Increase Zyprexa to 20 mg p.o. nightly.

## 2020-08-20 DIAGNOSIS — R809 Proteinuria, unspecified: Secondary | ICD-10-CM | POA: Diagnosis not present

## 2020-08-20 DIAGNOSIS — E1029 Type 1 diabetes mellitus with other diabetic kidney complication: Secondary | ICD-10-CM | POA: Diagnosis not present

## 2020-08-25 ENCOUNTER — Other Ambulatory Visit: Payer: Self-pay | Admitting: Psychiatry

## 2020-08-25 DIAGNOSIS — F411 Generalized anxiety disorder: Secondary | ICD-10-CM

## 2020-08-26 ENCOUNTER — Encounter: Payer: Self-pay | Admitting: Psychiatry

## 2020-08-26 ENCOUNTER — Telehealth (INDEPENDENT_AMBULATORY_CARE_PROVIDER_SITE_OTHER): Payer: HMO | Admitting: Psychiatry

## 2020-08-26 ENCOUNTER — Other Ambulatory Visit: Payer: Self-pay

## 2020-08-26 DIAGNOSIS — F5101 Primary insomnia: Secondary | ICD-10-CM

## 2020-08-26 DIAGNOSIS — F411 Generalized anxiety disorder: Secondary | ICD-10-CM | POA: Diagnosis not present

## 2020-08-26 DIAGNOSIS — F331 Major depressive disorder, recurrent, moderate: Secondary | ICD-10-CM | POA: Diagnosis not present

## 2020-08-26 MED ORDER — CLONAZEPAM 1 MG PO TABS: 0.5000 mg | ORAL_TABLET | Freq: Every day | ORAL | 1 refills | Status: DC | PRN

## 2020-08-26 MED ORDER — OLANZAPINE 15 MG PO TABS: 15.0000 mg | ORAL_TABLET | Freq: Every day | ORAL | 1 refills | Status: DC

## 2020-08-26 MED ORDER — BELSOMRA 20 MG PO TABS: 20.0000 mg | ORAL_TABLET | Freq: Every day | ORAL | 1 refills | Status: DC

## 2020-08-26 MED ORDER — VENLAFAXINE HCL ER 75 MG PO CP24: 75.0000 mg | ORAL_CAPSULE | Freq: Every day | ORAL | 1 refills | Status: DC

## 2020-08-26 NOTE — Patient Instructions (Signed)

## 2020-08-26 NOTE — Progress Notes (Signed)
Virtual Visit via Video Note  I connected with [name] on 08/26/20 at  3:30 PM EDT by a video enabled telemedicine application and verified that I am speaking with the correct person using two identifiers.  Location Provider Location : [facility] Patient Location : Home  Participants: Patient ,Wife, Provider   I discussed the limitations of evaluation and management by telemedicine and the availability of in person appointments. The patient expressed understanding and agreed to proceed.    I discussed the assessment and treatment plan with the patient. The patient was provided an opportunity to ask questions and all were answered. The patient agreed with the plan and demonstrated an understanding of the instructions.   The patient was advised to call back or seek an in-person evaluation if the symptoms worsen or if the condition fails to improve as anticipated.   [facility] MD OP Progress Note  08/26/2020 4:02 PM [name]  MRN:  [id number]  Chief Complaint:  Chief Complaint    Follow-up; Depression; Anxiety     HPI: [name] is a 60 year old Caucasian male, married, lives in [place], has a history of MDD, GAD, insomnia, history of tracheal stenosis status post multiple reintubation, respiratory failure due to opioid overdose, history of CVA, right-sided hemiparesis, diabetes, hypertension, NSTEMI, hypothyroidism, chronic pain was evaluated by telemedicine today.  Patient today reports he is currently struggling with anhedonia, low energy and sleep problems.  He reports he does not feel sad all the time however does feel sad several days a week.  Patient reports he has not been getting out of the house much, does not exercise.  His wife seems to think he is overeating.  Patient denies any suicidality, homicidality or perceptual disturbances.  Patient reports he continues to struggle with sleep.  He goes to bed at 10 PM, watches TV until he can fall asleep.   Patient reports in spite of taking the higher dosage of olanzapine in combination with the Belsomra he falls asleep only by 3 AM or 4 AM.  Patient reports that there has been nights when he has to take a Klonopin to fall asleep.  He hence sleeps through the next day afternoon.  He does not like to take Klonopin since it makes him groggy.  However it does help him to fall asleep quicker.  Patient denies any other concerns today.  Visit Diagnosis:    ICD-10-CM   1. MDD (major depressive disorder), recurrent episode, moderate (HCC)  F33.1 venlafaxine XR (EFFEXOR XR) 75 MG 24 hr capsule    OLANZapine (ZYPREXA) 15 MG tablet  2. GAD (generalized anxiety disorder)  F41.1 venlafaxine XR (EFFEXOR XR) 75 MG 24 hr capsule    clonazePAM (KLONOPIN) 1 MG tablet  3. Primary insomnia  F51.01 Suvorexant (BELSOMRA) 20 MG TABS    Past Psychiatric History: I have reviewed past psychiatric history from my progress note on 01/15/2019.  Past trials of fluoxetine, Rexulti, Xanax, Abilify.  Patient completed TMS-04/05/2020.  Patient with multiple [place] sessions in the past.  Patient with history of ECT however did not tolerate it well.  Patient with multiple inpatient mental health admissions in 2020.  Past suicide attempt in 2014.  Past Medical History:  Past Medical History:  Diagnosis Date  . Depression   . Diabetes (HCC)    Insulin Pump  . Diabetes mellitus type I ([place])   . Diabetes mellitus without complication ([place])   . GERD (gastroesophageal reflux disease)   . H/O laryngectomy   .  Heel bone fracture   . Hyperlipidemia   . Hypertension   . Radicular pain of right lower extremity   . Stroke ([place])   . Suicide attempt [facility]) 2014   damaged larynx - tracheostomy  . Thyroid disease     Past Surgical History:  Procedure Laterality Date  . COLONOSCOPY WITH PROPOFOL N/A 05/15/2018   Procedure: COLONOSCOPY WITH PROPOFOL;  Surgeon: [name], [name], MD;  Location: [facility] ENDOSCOPY;  Service: Gastroenterology;   Laterality: N/A;  . ESOPHAGOGASTRODUODENOSCOPY N/A 05/15/2018   Procedure: ESOPHAGOGASTRODUODENOSCOPY (EGD);  Surgeon: [name], [name], MD;  Location: [facility] ENDOSCOPY;  Service: Gastroenterology;  Laterality: N/A;  . FRACTURE SURGERY    . Heel bone reconstruction Left   . HERNIA REPAIR  [id number]   Umbilical hernia repair   . LARYNGECTOMY    . NECK SURGERY     fusion  . SPINE SURGERY    . TRACHEOSTOMY  2014   from [place] attempt    Family Psychiatric History: I have reviewed family psychiatric history from progress note on 01/15/2019  Family History:  Family History  Problem Relation Age of Onset  . Osteoporosis Mother   . Diabetes Mother   . Hypertension Father   . Mental illness Neg Hx     Social History: Reviewed social history from progress note on 01/15/2019 Social History   Socioeconomic History  . Marital status: Married    Spouse name: Not on file  . Number of children: Not on file  . Years of education: Not on file  . Highest education level: Not on file  Occupational History  . Not on file  Tobacco Use  . Smoking status: Former Smoker    Packs/day: 0.00    Types: Cigarettes    Quit date: 04/09/2013    Years since quitting: 7.3  . Smokeless tobacco: Never Used  Vaping Use  . Vaping Use: Never used  Substance and Sexual Activity  . Alcohol use: Yes    Alcohol/week: 2.0 standard drinks    Types: 2 Shots of liquor per week    Comment: rare  . Drug use: No    Comment: Pt denied; UDS not available  . Sexual activity: Yes    Partners: Female    Birth control/protection: Condom  Other Topics Concern  . Not on file  Social History Narrative  . Not on file   Social Determinants of Health   Financial Resource Strain: Not on file  Food Insecurity: Not on file  Transportation Needs: Not on file  Physical Activity: Not on file  Stress: Not on file  Social Connections: Not on file    Allergies:  Allergies  Allergen Reactions  . Buspar [Buspirone]     Makes  the patient "flip out"  . Depakote [Valproic Acid]     Causes excessive drowsiness  . Clopidogrel Rash  . Gabapentin Itching and Rash    Metabolic Disorder Labs: Lab Results  Component Value Date   HGBA1C 7.8 (H) 01/01/2019   MPG 177.16 01/01/2019   MPG 174.29 12/23/2018   No results found for: PROLACTIN Lab Results  Component Value Date   CHOL 161 09/15/2016   TRIG 190 (H) 09/15/2016   HDL 41 09/15/2016   CHOLHDL 3.9 09/15/2016   VLDL 38 09/15/2016   LDLCALC 82 09/15/2016   Lab Results  Component Value Date   TSH 1.952 09/15/2016    Therapeutic Level Labs: No results found for: LITHIUM Lab Results  Component Value Date   VALPROATE 19 (  L) 09/12/2016   No components found for:  CBMZ  Current Medications: Current Outpatient Medications  Medication Sig Dispense Refill  . OLANZapine (ZYPREXA) 15 MG tablet Take 1 tablet (15 mg total) by mouth at bedtime. 30 tablet 1  . Suvorexant (BELSOMRA) 20 MG TABS Take 20 mg by mouth at bedtime. 30 tablet 1  . venlafaxine XR (EFFEXOR XR) 75 MG 24 hr capsule Take 1 capsule (75 mg total) by mouth daily with breakfast. 30 capsule 1  . amLODipine (NORVASC) 5 MG tablet Take 1 tablet (5 mg total) by mouth daily. 30 tablet 1  . buPROPion (WELLBUTRIN XL) 300 MG 24 hr tablet Take 1 tablet (300 mg total) by mouth daily. 90 tablet 0  . clonazePAM (KLONOPIN) 1 MG tablet Take 0.5-1 tablets (0.5-1 mg total) by mouth daily as needed for anxiety. For severe anxiety attacks 30 tablet 1  . empagliflozin (JARDIANCE) 25 MG TABS tablet Take 25 mg by mouth daily. 30 tablet 1  . FLUCELVAX QUADRIVALENT 0.5 ML injection     . glycopyrrolate (ROBINUL) 1 MG tablet Take by mouth.    . insulin aspart (NOVOLOG) 100 UNIT/ML injection INJECT UP TO 120 UNITS UNDER THE SKIN AS DIRECTED DAILY VIA INSULIN PUMP    . levothyroxine (SYNTHROID) 75 MCG tablet TAKE 1 TABLET BY MOUTH DAILY AT 6 AM 90 tablet 0  . losartan-hydrochlorothiazide (HYZAAR) 100-25 MG tablet TAKE 1  TABLET BY MOUTH EVERY DAY    . pantoprazole (PROTONIX) 40 MG tablet TAKE 1 TABLET(40 MG) BY MOUTH TWICE DAILY     No current facility-administered medications for this visit.     Musculoskeletal: Strength & Muscle Tone: UTA Gait & Station: UTA Patient leans: N/A  Psychiatric Specialty Exam: Review of Systems  Constitutional: Positive for fatigue.  Psychiatric/Behavioral: Positive for dysphoric mood and sleep disturbance.  All other systems reviewed and are negative.   There were no vitals taken for this visit.There is no height or weight on file to calculate BMI.  General Appearance: Casual  Eye Contact:  Fair  Speech:  Slow Hx of tracheostomy  Volume:  Normal  Mood:  Depressed  Affect:  Congruent  Thought Process:  Goal Directed and Descriptions of Associations: Intact  Orientation:  Full (Time, Place, and Person)  Thought Content: Logical   Suicidal Thoughts:  No  Homicidal Thoughts:  No  Memory:  Immediate;   Fair Recent;   Fair Remote;   Fair  Judgement:  Fair  Insight:  Fair  Psychomotor Activity:  Normal  Concentration:  Concentration: Fair and Attention Span: Fair  Recall:  [employer] of Knowledge: Fair  Language: Fair  Akathisia:  No  Handed:  Right  AIMS (if indicated): UTA  Assets:  Communication Skills Desire for Improvement Housing Social Support  ADL's:  Intact  Cognition: WNL  Sleep:  Poor   Screenings: [place] Office Visit from 08/03/2020 in [place] Total Score 0    AUDIT   Flowsheet Row Admission (Discharged) from 01/22/2019 in [place] Admission (Discharged) from 12/31/2018 in [place] Admission (Discharged) from 09/14/2016 in [place]  Alcohol Use Disorder Identification Test Final Score (AUDIT) 0 0 1    ECT-MADRS   Flowsheet Row Admission (Discharged) from 01/22/2019 in [place]  Total Score 27    Mini-Mental   [place] Admission (Discharged) from 01/22/2019 in [place]  Total Score (  max 30 points ) 30    PHQ2-9   Flowsheet Row Video Visit from 08/26/2020 in [place] Office Visit from 08/03/2020 in [place] Video Visit from 07/02/2020 in [place] Video Visit from 05/11/2020 in [facility] ED from 04/28/2020 in [facility]  PHQ-2 Total Score 3 1 1 2 3   PHQ-9 Total Score 12 -- -- 7 20    [place] Office Visit from 08/03/2020 in [place] Video Visit from 07/02/2020 in [facility] ED from 04/28/2020 in [place] No Risk Low Risk High Risk       Assessment and Plan: [name] is a 60 year old Caucasian male, married, on disability, history of multiple medical problems, MDD, GAD was evaluated by telemedicine today.  Patient is biologically predisposed given his multiple health issues.  Patient with psychosocial stressors of pandemic, his own health issues currently struggling with depressive symptoms, sleep problems, low energy.  He would benefit from the following plan.  Plan MDD-unstable PHQ-9 today equals 12 Wellbutrin XL 300 mg p.o. daily Increase venlafaxine extended release to 75 mg p.o. daily Reduce Zyprexa to 15 mg p.o. nightly   GAD- stable Venlafaxine as prescribed Klonopin 0.5-1 mg as needed for severe anxiety attacks only Continue CBT  Insomnia-unstable Patient needs to work on sleep hygiene techniques.  Discussed with patient to switch off TV computer iPhones video games prior to bedtime at least couple of hours.  Patient advised to listen to music, read a book prior to bedtime.  Keep his room dark.  Keep it comfortable.  Wear something comfortable to  bed.  Reduce the amount of caffeine.  Reduce amount of fluid intake closer to bedtime.  Exercise regularly. Increase Belsomra to 20 mg p.o. nightly   Patient advised to continue CBT with his therapist Mr. [name]  Collateral information provided by his wife as noted above.  Follow-up in clinic in 3 weeks or sooner if needed.   This note was generated in part or whole with voice recognition software. Voice recognition is usually quite accurate but there are transcription errors that can and very often do occur. I apologize for any typographical errors that were not detected and corrected.      Ursula Alert, MD 08/27/2020, 8:29 AM

## 2020-09-06 ENCOUNTER — Ambulatory Visit (INDEPENDENT_AMBULATORY_CARE_PROVIDER_SITE_OTHER): Payer: HMO | Admitting: Clinical

## 2020-09-06 ENCOUNTER — Other Ambulatory Visit: Payer: Self-pay

## 2020-09-06 DIAGNOSIS — F411 Generalized anxiety disorder: Secondary | ICD-10-CM | POA: Diagnosis not present

## 2020-09-06 DIAGNOSIS — F332 Major depressive disorder, recurrent severe without psychotic features: Secondary | ICD-10-CM | POA: Diagnosis not present

## 2020-09-06 NOTE — Progress Notes (Signed)
Virtual Visit via Telephone Note  I connected with [name] on 09/06/20 at 11:00 AM EDT by telephone and verified that I am speaking with the correct person using two identifiers.  Location: Patient: Home Provider: Office   I discussed the limitations, risks, security and privacy concerns of performing an evaluation and management service by telephone and the availability of in person appointments. I also discussed with the patient that there may be a patient responsible charge related to this service. The patient expressed understanding and agreed to proceed.   THERAPIST PROGRESS NOTE  Session Time:11:00AM-11:30AM  Participation Level:Active  Behavioral Response:CasualAlertIrratible  Type of Therapy:Individual Therapy  Treatment Goals addressed:Coping  Interventions:CBT, Solution Focused, Strength-based and Supportive  Summary:[name] a 59y.o.malewho presents with Major Depression and Generalized Anxiety.The OPT therapist worked with thepatientfor his ongoing [place] treatment. The OPT therapist utilized Motivational Interviewing to assist in creating therapeutic repore. The patient in the session was engaged and work in collaboration giving feedback about his triggers and symptoms over the past few weeksincludingongoing including working to manage his medications and looking for [employer]. The OPT therapist utilized Cognitive Behavioral Therapy through cognitive restructuring as well as worked with the patient on coping strategies to assist in management of Anxietyand Depression. The OPT therapist inquired for holistic care about the patients adherence to medication therapy including utilizing a pill organizer to stay consistent with his medications.  Suicidal/Homicidal:Nowithout intent/plan  Therapist Response:The OPT therapist worked with the patient for the patients scheduled session. The patient was engaged in his session and gave  feedback in relation to triggers, symptoms, and behavior responses over the past fewweeks. The OPT therapist worked with the patient utilizing an in session Cognitive Behavioral Therapy exercise. The patient was responsive in the session and verbalized, " I have been having trouble keeping track of my medcine when to take it and what to take".The OPT therapist reviewed with the patient his medication/dosages/ and when to take the medication based on his note from his most recent appointment with Dr. Shea Evans, however, also requested that the patient verify this with his psychiatrist as soon as possible and reviewed with the patient utilizing a medication organizer (pill organizer) to improve the patients consistency with his prescribed medication and reduce any negative effects of inconsistency. The OPT therapist will continue treatment work with the patient in his next scheduled session.  Plan: Return again in2/3weeks.  Diagnosis:Axis I:Generalized Anxiety Disorder and Major Depression, Recurrent severew/o psy features  Axis II:No diagnosis   I discussed the assessment and treatment plan with the patient. The patient was provided an opportunity to ask questions and all were answered. The patient agreed with the plan and demonstrated an understanding of the instructions.  The patient was advised to call back or seek an in-person evaluation if the symptoms worsen or if the condition fails to improve as anticipated.  I provided57minutes of non-face-to-face time during this encounter.   [name], LCSW  09/06/2020

## 2020-09-09 DIAGNOSIS — E1029 Type 1 diabetes mellitus with other diabetic kidney complication: Secondary | ICD-10-CM | POA: Diagnosis not present

## 2020-09-09 DIAGNOSIS — R809 Proteinuria, unspecified: Secondary | ICD-10-CM | POA: Diagnosis not present

## 2020-09-09 DIAGNOSIS — E1069 Type 1 diabetes mellitus with other specified complication: Secondary | ICD-10-CM | POA: Diagnosis not present

## 2020-09-09 DIAGNOSIS — I152 Hypertension secondary to endocrine disorders: Secondary | ICD-10-CM | POA: Diagnosis not present

## 2020-09-09 DIAGNOSIS — E785 Hyperlipidemia, unspecified: Secondary | ICD-10-CM | POA: Diagnosis not present

## 2020-09-09 DIAGNOSIS — E1159 Type 2 diabetes mellitus with other circulatory complications: Secondary | ICD-10-CM | POA: Diagnosis not present

## 2020-09-14 DIAGNOSIS — E103291 Type 1 diabetes mellitus with mild nonproliferative diabetic retinopathy without macular edema, right eye: Secondary | ICD-10-CM | POA: Diagnosis not present

## 2020-09-16 ENCOUNTER — Telehealth (INDEPENDENT_AMBULATORY_CARE_PROVIDER_SITE_OTHER): Payer: HMO | Admitting: Psychiatry

## 2020-09-16 ENCOUNTER — Encounter: Payer: Self-pay | Admitting: Psychiatry

## 2020-09-16 ENCOUNTER — Other Ambulatory Visit: Payer: Self-pay

## 2020-09-16 DIAGNOSIS — F5101 Primary insomnia: Secondary | ICD-10-CM | POA: Diagnosis not present

## 2020-09-16 DIAGNOSIS — F3341 Major depressive disorder, recurrent, in partial remission: Secondary | ICD-10-CM | POA: Diagnosis not present

## 2020-09-16 DIAGNOSIS — F411 Generalized anxiety disorder: Secondary | ICD-10-CM | POA: Diagnosis not present

## 2020-09-16 MED ORDER — VENLAFAXINE HCL ER 150 MG PO CP24: 150.0000 mg | ORAL_CAPSULE | Freq: Every day | ORAL | 1 refills | Status: DC

## 2020-09-16 NOTE — Progress Notes (Signed)
Virtual Visit via Video Note  I connected with [name] on 09/16/20 at  2:30 PM EDT by a video enabled telemedicine application and verified that I am speaking with the correct person using two identifiers.  Location Provider Location : [facility] Patient Location : Home  Participants: Patient , Spouse, Provider   I discussed the limitations of evaluation and management by telemedicine and the availability of in person appointments. The patient expressed understanding and agreed to proceed.    I discussed the assessment and treatment plan with the patient. The patient was provided an opportunity to ask questions and all were answered. The patient agreed with the plan and demonstrated an understanding of the instructions.   The patient was advised to call back or seek an in-person evaluation if the symptoms worsen or if the condition fails to improve as anticipated.   [place] MD OP Progress Note  09/16/2020 2:53 PM Zylan Almquist  MRN:  [id number]  Chief Complaint:  Chief Complaint    Follow-up; Anxiety; Depression     HPI: [name] is a 60 year old Caucasian male, married, lives in [place], has a history of MDD, GAD, insomnia, history of tracheal stenosis status post multiple reintubation, respiratory failure due to opioid overdose, history of CVA, right-sided hemiparesis, diabetes, hypertension, NSTEMI, hypothyroidism, chronic pain was evaluated by telemedicine today.  Patient today reports he has noticed improvement in his sleep.  The current dosage of Belsomra and olanzapine has made a huge difference.  Patient however reports he continues to struggle with depressive symptoms although making progress.  He does have sadness on and off, lack of motivation, he tries not to go outside until it is necessary.  Patient denies any suicidality.  Patient denies side effects to medications.  He does take the Klonopin at least 3-4 times a week and that helps.  He is  trying to limit use.  He continues to follow-up with his therapist and reports therapy sessions are beneficial.  Patient denies any other concerns today.  Visit Diagnosis:    ICD-10-CM   1. MDD (major depressive disorder), recurrent, in partial remission (HCC)  F33.41 venlafaxine XR (EFFEXOR XR) 150 MG 24 hr capsule  2. GAD (generalized anxiety disorder)  F41.1 venlafaxine XR (EFFEXOR XR) 150 MG 24 hr capsule  3. Primary insomnia  F51.01     Past Psychiatric History: I have reviewed past psychiatric history from progress note on 01/15/2019.  Past trials of fluoxetine, Rexulti, Xanax, Abilify.  Patient completed TMS-04/05/2020.  Patient with multiple [place] sessions in the past.  Patient with history of ECT however did not tolerate it.  Patient with multiple inpatient mental health admissions in 2020.  Past suicide attempt in 2014.  Past Medical History:  Past Medical History:  Diagnosis Date  . Depression   . Diabetes (HCC)    Insulin Pump  . Diabetes mellitus type I ([place])   . Diabetes mellitus without complication ([place])   . GERD (gastroesophageal reflux disease)   . H/O laryngectomy   . Heel bone fracture   . Hyperlipidemia   . Hypertension   . Radicular pain of right lower extremity   . Stroke ([place])   . Suicide attempt [facility]) 2014   damaged larynx - tracheostomy  . Thyroid disease     Past Surgical History:  Procedure Laterality Date  . COLONOSCOPY WITH PROPOFOL N/A 05/15/2018   Procedure: COLONOSCOPY WITH PROPOFOL;  Surgeon: [name], [name], MD;  Location: [facility] ENDOSCOPY;  Service: Gastroenterology;  Laterality:  N/A;  . ESOPHAGOGASTRODUODENOSCOPY N/A 05/15/2018   Procedure: ESOPHAGOGASTRODUODENOSCOPY (EGD);  Surgeon: [name], [name], MD;  Location: [facility] ENDOSCOPY;  Service: Gastroenterology;  Laterality: N/A;  . FRACTURE SURGERY    . Heel bone reconstruction Left   . HERNIA REPAIR  [id number]   Umbilical hernia repair   . LARYNGECTOMY    . NECK SURGERY     fusion  . SPINE  SURGERY    . TRACHEOSTOMY  2014   from [place] attempt    Family Psychiatric History: I have reviewed family psychiatric history from progress note on 01/15/2019  Family History:  Family History  Problem Relation Age of Onset  . Osteoporosis Mother   . Diabetes Mother   . Hypertension Father   . Mental illness Neg Hx     Social History: Reviewed social history from progress note on 01/15/2019 Social History   Socioeconomic History  . Marital status: Married    Spouse name: Not on file  . Number of children: Not on file  . Years of education: Not on file  . Highest education level: Not on file  Occupational History  . Not on file  Tobacco Use  . Smoking status: Former Smoker    Packs/day: 0.00    Types: Cigarettes    Quit date: 04/09/2013    Years since quitting: 7.4  . Smokeless tobacco: Never Used  Vaping Use  . Vaping Use: Never used  Substance and Sexual Activity  . Alcohol use: Yes    Alcohol/week: 2.0 standard drinks    Types: 2 Shots of liquor per week    Comment: rare  . Drug use: No    Comment: Pt denied; UDS not available  . Sexual activity: Yes    Partners: Female    Birth control/protection: Condom  Other Topics Concern  . Not on file  Social History Narrative  . Not on file   Social Determinants of Health   Financial Resource Strain: Not on file  Food Insecurity: Not on file  Transportation Needs: Not on file  Physical Activity: Not on file  Stress: Not on file  Social Connections: Not on file    Allergies:  Allergies  Allergen Reactions  . Buspar [Buspirone]     Makes the patient "flip out"  . Depakote [Valproic Acid]     Causes excessive drowsiness  . Clopidogrel Rash  . Gabapentin Itching and Rash    Metabolic Disorder Labs: Lab Results  Component Value Date   HGBA1C 7.8 (H) 01/01/2019   MPG 177.16 01/01/2019   MPG 174.29 12/23/2018   No results found for: PROLACTIN Lab Results  Component Value Date   CHOL 161 09/15/2016   TRIG  190 (H) 09/15/2016   HDL 41 09/15/2016   CHOLHDL 3.9 09/15/2016   VLDL 38 09/15/2016   LDLCALC 82 09/15/2016   Lab Results  Component Value Date   TSH 1.952 09/15/2016    Therapeutic Level Labs: No results found for: LITHIUM Lab Results  Component Value Date   VALPROATE 19 (L) 09/12/2016   No components found for:  CBMZ  Current Medications: Current Outpatient Medications  Medication Sig Dispense Refill  . venlafaxine XR (EFFEXOR XR) 150 MG 24 hr capsule Take 1 capsule (150 mg total) by mouth daily with breakfast. 30 capsule 1  . amLODipine (NORVASC) 5 MG tablet Take 1 tablet (5 mg total) by mouth daily. 30 tablet 1  . buPROPion (WELLBUTRIN XL) 300 MG 24 hr tablet Take 1 tablet (300 mg total)  by mouth daily. 90 tablet 0  . clonazePAM (KLONOPIN) 1 MG tablet Take 0.5-1 tablets (0.5-1 mg total) by mouth daily as needed for anxiety. For severe anxiety attacks 30 tablet 1  . empagliflozin (JARDIANCE) 25 MG TABS tablet Take 25 mg by mouth daily. 30 tablet 1  . FLUCELVAX QUADRIVALENT 0.5 ML injection     . glycopyrrolate (ROBINUL) 1 MG tablet Take by mouth.    . insulin aspart (NOVOLOG) 100 UNIT/ML injection INJECT UP TO 120 UNITS UNDER THE SKIN AS DIRECTED DAILY VIA INSULIN PUMP    . levothyroxine (SYNTHROID) 75 MCG tablet TAKE 1 TABLET BY MOUTH DAILY AT 6 AM 90 tablet 0  . losartan-hydrochlorothiazide (HYZAAR) 100-25 MG tablet TAKE 1 TABLET BY MOUTH EVERY DAY    . OLANZapine (ZYPREXA) 15 MG tablet Take 1 tablet (15 mg total) by mouth at bedtime. 30 tablet 1  . pantoprazole (PROTONIX) 40 MG tablet TAKE 1 TABLET(40 MG) BY MOUTH TWICE DAILY    . rosuvastatin (CRESTOR) 20 MG tablet Take 20 mg by mouth at bedtime.    . Suvorexant (BELSOMRA) 20 MG TABS Take 20 mg by mouth at bedtime. 30 tablet 1   No current facility-administered medications for this visit.     Musculoskeletal: Strength & Muscle Tone: UTA Gait & Station: UTA Patient leans: N/A  Psychiatric Specialty Exam: Review  of Systems  Psychiatric/Behavioral: Positive for dysphoric mood and sleep disturbance (improving).  All other systems reviewed and are negative.   There were no vitals taken for this visit.There is no height or weight on file to calculate BMI.  General Appearance: Casual  Eye Contact:  Fair  Speech:  Slow Hx of tracheostomy  Volume:  Normal  Mood:  Depressed  Affect:  Congruent  Thought Process:  Goal Directed and Descriptions of Associations: Intact  Orientation:  Full (Time, Place, and Person)  Thought Content: Logical   Suicidal Thoughts:  No  Homicidal Thoughts:  No  Memory:  Immediate;   Fair Recent;   Fair Remote;   Fair  Judgement:  Fair  Insight:  Fair  Psychomotor Activity:  Normal  Concentration:  Concentration: Fair and Attention Span: Fair  Recall:  [employer] of Knowledge: Fair  Language: Fair  Akathisia:  No  Handed:  Right  AIMS (if indicated): UTA  Assets:  Communication Skills Desire for Improvement Housing Social Support  ADL's:  Intact  Cognition: WNL  Sleep:  Improving   Screenings: [place] Office Visit from 08/03/2020 in [place] Total Score 0    AUDIT   Flowsheet Row Admission (Discharged) from 01/22/2019 in [place] Admission (Discharged) from 12/31/2018 in [place] Admission (Discharged) from 09/14/2016 in [place]  Alcohol Use Disorder Identification Test Final Score (AUDIT) 0 0 1    ECT-MADRS   Flowsheet Row Admission (Discharged) from 01/22/2019 in [place] Total Score 27    Mini-Mental   [place] Admission (Discharged) from 01/22/2019 in [place]  Total Score (max 30 points ) 30    PHQ2-9   Flowsheet Row Video Visit from 09/16/2020 in [place] Video Visit from 08/26/2020 in [place] Office  Visit from 08/03/2020 in [place] Video Visit from 07/02/2020 in [place] Video Visit from 05/11/2020 in [facility]  PHQ-2 Total Score 3 3 1 1 2   PHQ-9  Total Score 10 12 -- -- 7    Flowsheet Row Office Visit from 08/03/2020 in [place] Video Visit from 07/02/2020 in [place] ED from 04/28/2020 in [place] No Risk Low Risk High Risk       Assessment and Plan: Engelbert Sevin is a 60 year old Caucasian male, married, on disability, history of multiple medical problems, MDD, GAD was evaluated by telemedicine today.  Patient is biologically predisposed given his multiple health issues.  Patient with psychosocial stressors of pandemic, his own health issues currently struggling with depressive symptoms although making progress.  Plan as noted below.  Plan MDD in partial remission PHQ-9 today equals 10 Continue Wellbutrin XL 300 mg p.o. daily Increase Effexor extended release to 150 mg p.o. daily Continue Zyprexa at reduced dose to 15 mg p.o. nightly   GAD-stable Venlafaxine as prescribed Klonopin 0.5-1 mg as needed for severe anxiety attacks only.  He has been limiting use. Continue CBT  Insomnia-improving Continue sleep hygiene techniques Belsomra 20 mg p.o. nightly  Patient to continue to follow-up with Mr. [name] for CBT  Patient to continue to follow-up with primary care provider for elevated hemoglobin A1c, currently making lifestyle changes.  Follow-up in clinic in 4 weeks or sooner if needed.  This note was generated in part or whole with voice recognition software. Voice recognition is usually quite accurate but there are transcription errors that can and very often do occur. I apologize for any typographical errors that were not detected and  corrected.       Ursula Alert, MD 09/17/2020, 8:41 AM

## 2020-09-27 ENCOUNTER — Other Ambulatory Visit: Payer: Self-pay

## 2020-09-27 ENCOUNTER — Ambulatory Visit (HOSPITAL_COMMUNITY): Payer: HMO | Admitting: Clinical

## 2020-09-27 ENCOUNTER — Telehealth (HOSPITAL_COMMUNITY): Payer: Self-pay | Admitting: Clinical

## 2020-09-27 NOTE — Telephone Encounter (Signed)
The patient did not respond to attempts to contact and missed his scheduled appointment

## 2020-09-28 DIAGNOSIS — R809 Proteinuria, unspecified: Secondary | ICD-10-CM | POA: Diagnosis not present

## 2020-09-28 DIAGNOSIS — E1029 Type 1 diabetes mellitus with other diabetic kidney complication: Secondary | ICD-10-CM | POA: Diagnosis not present

## 2020-10-07 ENCOUNTER — Ambulatory Visit (INDEPENDENT_AMBULATORY_CARE_PROVIDER_SITE_OTHER): Payer: HMO | Admitting: Clinical

## 2020-10-07 ENCOUNTER — Other Ambulatory Visit: Payer: Self-pay

## 2020-10-07 DIAGNOSIS — F332 Major depressive disorder, recurrent severe without psychotic features: Secondary | ICD-10-CM | POA: Diagnosis not present

## 2020-10-07 DIAGNOSIS — F411 Generalized anxiety disorder: Secondary | ICD-10-CM | POA: Diagnosis not present

## 2020-10-07 NOTE — Progress Notes (Signed)
Virtual Visit via Telephone Note  I connected withDaniel Kadeen Holder on 10/07/20 at 1:00 PM EDT by telephoneand verified that I am speaking with the correct person using two identifiers.  Location: Patient: Home Provider: Office  I discussed the limitations, risks, security and privacy concerns of performing an evaluation and management service by telephone and the availability of in person appointments. I also discussed with the patient that there may be a patient responsible charge related to this service. The patient expressed understanding and agreed to proceed.   THERAPIST PROGRESS NOTE  Session Time:1:00PM-1:20PM  Participation Level:Active  Behavioral Response:CasualAlertIrratible  Type of Therapy:Individual Therapy  Treatment Goals addressed:Coping  Interventions:CBT, Solution Focused, Strength-based and Supportive  Summary:[name] a 59y.o.malewho presents with Major Depression and Generalized Anxiety.The OPT therapist worked with thepatientfor his ongoing [place] treatment. The OPT therapist utilized Motivational Interviewing to assist in creating therapeutic repore. The patient in the session was engaged and work in collaboration giving feedback about his triggers and symptoms over the past few weeksincludingongoing including difficulty regulating sleep cycle and currently being under the weather. The OPT therapist utilized Cognitive Behavioral Therapy through cognitive restructuring as well as worked with the patient on coping strategies to assist in management of Anxietyand Depression. The OPT therapist inquired for holistic care about the patients adherence to medication therapy. The patient intends to continue to give feedback about his sleep difficulty with his psychiatrist at his upcoming appointment.  Suicidal/Homicidal:Nowithout intent/plan  Therapist Response:The OPT therapist worked with the patient for the patients  scheduled session. The patient was engaged in his session and gave feedback in relation to triggers, symptoms, and behavior responses over the past fewweeks. The OPT therapist worked with the patient utilizing an in session Cognitive Behavioral Therapy exercise. The patient was responsive in the session and verbalized, "I still having trouble with my sleep my medicine seems like it takes to long to kick in and then I have a hard time getting up the next day".The OPT therapist reviewed with the patient his appointment with Dr. Shea Evans.The OPT therapist additionally noted the patient may need to be seen by his PCP since he reported being sick/under the weather. The OPT therapist will continue treatment work with the patient in his next scheduled session.  Plan: Return again in2/3weeks.  Diagnosis:Axis I:Generalized Anxiety Disorder and Major Depression, Recurrent severew/o psy features  Axis II:No diagnosis   I discussed the assessment and treatment plan with the patient. The patient was provided an opportunity to ask questions and all were answered. The patient agreed with the plan and demonstrated an understanding of the instructions.  The patient was advised to call back or seek an in-person evaluation if the symptoms worsen or if the condition fails to improve as anticipated.  I provided60minutes of non-face-to-face time during this encounter.   [name], LCSW  10/07/2020

## 2020-10-11 ENCOUNTER — Other Ambulatory Visit: Payer: Self-pay | Admitting: Psychiatry

## 2020-10-11 DIAGNOSIS — F411 Generalized anxiety disorder: Secondary | ICD-10-CM

## 2020-10-13 DIAGNOSIS — J189 Pneumonia, unspecified organism: Secondary | ICD-10-CM | POA: Diagnosis not present

## 2020-10-18 ENCOUNTER — Telehealth (INDEPENDENT_AMBULATORY_CARE_PROVIDER_SITE_OTHER): Payer: HMO | Admitting: Psychiatry

## 2020-10-18 ENCOUNTER — Other Ambulatory Visit: Payer: Self-pay

## 2020-10-18 DIAGNOSIS — Z5329 Procedure and treatment not carried out because of patient's decision for other reasons: Secondary | ICD-10-CM

## 2020-10-18 NOTE — Progress Notes (Signed)
No response to call or text or video invite  

## 2020-10-21 DIAGNOSIS — Z430 Encounter for attention to tracheostomy: Secondary | ICD-10-CM | POA: Diagnosis not present

## 2020-10-25 ENCOUNTER — Other Ambulatory Visit: Payer: Self-pay

## 2020-10-25 ENCOUNTER — Telehealth (INDEPENDENT_AMBULATORY_CARE_PROVIDER_SITE_OTHER): Payer: HMO | Admitting: Psychiatry

## 2020-10-25 DIAGNOSIS — F332 Major depressive disorder, recurrent severe without psychotic features: Secondary | ICD-10-CM

## 2020-10-25 NOTE — Progress Notes (Signed)
Patient picked up the phone and said his Internet is not working.  Will reschedule for tomorrow.

## 2020-10-26 ENCOUNTER — Telehealth (INDEPENDENT_AMBULATORY_CARE_PROVIDER_SITE_OTHER): Payer: HMO | Admitting: Psychiatry

## 2020-10-26 ENCOUNTER — Encounter: Payer: Self-pay | Admitting: Psychiatry

## 2020-10-26 DIAGNOSIS — T50905A Adverse effect of unspecified drugs, medicaments and biological substances, initial encounter: Secondary | ICD-10-CM | POA: Diagnosis not present

## 2020-10-26 DIAGNOSIS — F5101 Primary insomnia: Secondary | ICD-10-CM

## 2020-10-26 DIAGNOSIS — R635 Abnormal weight gain: Secondary | ICD-10-CM

## 2020-10-26 DIAGNOSIS — F331 Major depressive disorder, recurrent, moderate: Secondary | ICD-10-CM

## 2020-10-26 DIAGNOSIS — F3341 Major depressive disorder, recurrent, in partial remission: Secondary | ICD-10-CM

## 2020-10-26 DIAGNOSIS — F411 Generalized anxiety disorder: Secondary | ICD-10-CM

## 2020-10-26 MED ORDER — BUPROPION HCL ER (XL) 300 MG PO TB24: 300.0000 mg | ORAL_TABLET | Freq: Every day | ORAL | 0 refills | Status: DC

## 2020-10-26 MED ORDER — OLANZAPINE 15 MG PO TABS: 15.0000 mg | ORAL_TABLET | Freq: Every day | ORAL | 1 refills | Status: DC

## 2020-10-26 MED ORDER — VENLAFAXINE HCL ER 150 MG PO CP24: 150.0000 mg | ORAL_CAPSULE | Freq: Every day | ORAL | 1 refills | Status: DC

## 2020-10-26 MED ORDER — BELSOMRA 20 MG PO TABS: 20.0000 mg | ORAL_TABLET | Freq: Every day | ORAL | 1 refills | Status: DC

## 2020-10-26 NOTE — Progress Notes (Signed)
Virtual Visit via Video Note  I connected with [name] on 10/26/20 at 10:30 AM EDT by a video enabled telemedicine application and verified that I am speaking with the correct person using two identifiers.  Location Provider Location : [facility] Patient Location : Home  Participants: Patient , Wife,Provider    I discussed the limitations of evaluation and management by telemedicine and the availability of in person appointments. The patient expressed understanding and agreed to proceed.   I discussed the assessment and treatment plan with the patient. The patient was provided an opportunity to ask questions and all were answered. The patient agreed with the plan and demonstrated an understanding of the instructions.   The patient was advised to call back or seek an in-person evaluation if the symptoms worsen or if the condition fails to improve as anticipated.   [place] MD OP Progress Note  10/26/2020 1:08 PM [name]  MRN:  [id number]  Chief Complaint:  Chief Complaint   Follow-up; Anxiety; Depression    HPI: [name] is a 60 year old Caucasian male, married, lives in [place], has a history of MDD, GAD, insomnia, history of tracheal stenosis status post multiple reintubation, respiratory failure due to opioid overdose, history of CVA, right-sided hemiparesis, diabetes, hypertension, NSTEMI, hypothyroidism, chronic pain was evaluated by telemedicine today.  Patient today reports sleep is improved at night.  However he is sleeping too much during the day.  He reports it is likely due to boredom and not having enough activities to focus on during the day.  He however is planning to make an attempt to keep himself occupied with certain chores around the house, certain work he has to do in his yard as well as businesses that he has to focus on.  He reports he does not want to make changes with his medications yet and wants to work on sleep hygiene.  Patient reports  his depressive symptoms as improving on the current combination of medication.  He does have passive death wish, chronic however denies any active suicidal thoughts or plan.  Patient denies any homicidality or perceptual disturbances.  Patient reports he is worried about his weight gain and may have gained 40 pounds in the past 3 months.  He reports he tends to snack more at the end of the day.  He however is not interested in reducing the olanzapine dosage yet.  He is willing to work on his diet.  Patient denies any other concerns today.  Visit Diagnosis:    ICD-10-CM   1. MDD (major depressive disorder), recurrent, in partial remission (HCC)  F33.41 OLANZapine (ZYPREXA) 15 MG tablet    2. GAD (generalized anxiety disorder)  F41.1 venlafaxine XR (EFFEXOR XR) 150 MG 24 hr capsule    3. Primary insomnia  F51.01 Suvorexant (BELSOMRA) 20 MG TABS    4. Weight gain due to medication  R63.5 buPROPion (WELLBUTRIN XL) 300 MG 24 hr tablet   T50.905A       Past Psychiatric History: Reviewed past psychiatric history from progress note on 01/15/2019.  Past trials of medications- Fluoxetine Rexulti Xanax Abilify Completed TMS-04/05/2020.  Multiple TMS sessions in the past. Suicide attempt-in 2014. Multiple inpatient mental health admissions in 2020.  Past Medical History:  Past Medical History:  Diagnosis Date   Depression    Diabetes ([place])    Insulin Pump   Diabetes mellitus type I ([place])    Diabetes mellitus without complication (HCC)    GERD (gastroesophageal reflux disease)  H/O laryngectomy    Heel bone fracture    Hyperlipidemia    Hypertension    Radicular pain of right lower extremity    Stroke [facility])    Suicide attempt [facility]) 2014   damaged larynx - tracheostomy   Thyroid disease     Past Surgical History:  Procedure Laterality Date   COLONOSCOPY WITH PROPOFOL N/A 05/15/2018   Procedure: COLONOSCOPY WITH PROPOFOL;  Surgeon: [name], [name], MD;  Location: [facility]  ENDOSCOPY;  Service: Gastroenterology;  Laterality: N/A;   ESOPHAGOGASTRODUODENOSCOPY N/A 05/15/2018   Procedure: ESOPHAGOGASTRODUODENOSCOPY (EGD);  Surgeon: [name], [name], MD;  Location: [facility] ENDOSCOPY;  Service: Gastroenterology;  Laterality: N/A;   FRACTURE SURGERY     Heel bone reconstruction Left    HERNIA REPAIR  00/1749   Umbilical hernia repair    LARYNGECTOMY     NECK SURGERY     fusion   SPINE SURGERY     TRACHEOSTOMY  2014   from  attempt    Family Psychiatric History: Reviewed past family psychiatric history and progress note on 01/15/2019  Family History:  Family History  Problem Relation Age of Onset   Osteoporosis Mother    Diabetes Mother    Hypertension Father    Mental illness Neg Hx     Social History: Reviewed social history from progress note on 01/15/2019 Social History   Socioeconomic History   Marital status: Married    Spouse name: Not on file   Number of children: Not on file   Years of education: Not on file   Highest education level: Not on file  Occupational History   Not on file  Tobacco Use   Smoking status: Former    Packs/day: 0.00    Pack years: 0.00    Types: Cigarettes    Quit date: 04/09/2013    Years since quitting: 7.5   Smokeless tobacco: Never  Vaping Use   Vaping Use: Never used  Substance and Sexual Activity   Alcohol use: Yes    Alcohol/week: 2.0 standard drinks    Types: 2 Shots of liquor per week    Comment: rare   Drug use: No    Comment: Pt denied; UDS not available   Sexual activity: Yes    Partners: Female    Birth control/protection: Condom  Other Topics Concern   Not on file  Social History Narrative   Not on file   Social Determinants of Health   Financial Resource Strain: Not on file  Food Insecurity: Not on file  Transportation Needs: Not on file  Physical Activity: Not on file  Stress: Not on file  Social Connections: Not on file    Allergies:  Allergies  Allergen Reactions   Buspar  [Buspirone]     Makes the patient "flip out"   Depakote [Valproic Acid]     Causes excessive drowsiness   Clopidogrel Rash   Gabapentin Itching and Rash    Metabolic Disorder Labs: Lab Results  Component Value Date   HGBA1C 7.8 (H) 01/01/2019   MPG 177.16 01/01/2019   MPG 174.29 12/23/2018   No results found for: PROLACTIN Lab Results  Component Value Date   CHOL 161 09/15/2016   TRIG 190 (H) 09/15/2016   HDL 41 09/15/2016   CHOLHDL 3.9 09/15/2016   VLDL 38 09/15/2016   [place] 82 09/15/2016   Lab Results  Component Value Date   TSH 1.952 09/15/2016    Therapeutic Level Labs: No results found for: LITHIUM Lab Results  Component Value Date   VALPROATE 19 (L) 09/12/2016   No components found for:  CBMZ  Current Medications: Current Outpatient Medications  Medication Sig Dispense Refill   amLODipine (NORVASC) 5 MG tablet Take 1 tablet (5 mg total) by mouth daily. 30 tablet 1   buPROPion (WELLBUTRIN XL) 300 MG 24 hr tablet Take 1 tablet (300 mg total) by mouth daily. 90 tablet 0   clonazePAM (KLONOPIN) 1 MG tablet TAKE 1/2 TO 1 TABLET(0.5 TO 1 MG) BY MOUTH DAILY AS NEEDED FOR ANXIETY OR SEVERE ANXIETY 30 tablet 1   empagliflozin (JARDIANCE) 25 MG TABS tablet Take 25 mg by mouth daily. 30 tablet 1   FLUCELVAX QUADRIVALENT 0.5 ML injection      glycopyrrolate (ROBINUL) 1 MG tablet Take by mouth.     insulin aspart (NOVOLOG) 100 UNIT/ML injection INJECT UP TO 120 UNITS UNDER THE SKIN AS DIRECTED DAILY VIA INSULIN PUMP     levothyroxine (SYNTHROID) 75 MCG tablet TAKE 1 TABLET BY MOUTH DAILY AT 6 AM 90 tablet 0   losartan-hydrochlorothiazide (HYZAAR) 100-25 MG tablet TAKE 1 TABLET BY MOUTH EVERY DAY     OLANZapine (ZYPREXA) 15 MG tablet Take 1 tablet (15 mg total) by mouth at bedtime. 30 tablet 1   pantoprazole (PROTONIX) 40 MG tablet TAKE 1 TABLET(40 MG) BY MOUTH TWICE DAILY     rosuvastatin (CRESTOR) 20 MG tablet Take 20 mg by mouth at bedtime.     Suvorexant (BELSOMRA)  20 MG TABS Take 20 mg by mouth at bedtime. 30 tablet 1   venlafaxine XR (EFFEXOR XR) 150 MG 24 hr capsule Take 1 capsule (150 mg total) by mouth daily with breakfast. 30 capsule 1   No current facility-administered medications for this visit.     Musculoskeletal: Strength & Muscle Tone:  UTA Gait & Station:  UTA Patient leans: N/A  Psychiatric Specialty Exam: Review of Systems  Constitutional:  Positive for appetite change and fatigue.  Musculoskeletal:  Positive for back pain.  Psychiatric/Behavioral:  Positive for sleep disturbance. The patient is nervous/anxious.   All other systems reviewed and are negative.  There were no vitals taken for this visit.There is no height or weight on file to calculate BMI.  General Appearance: Casual  Eye Contact:  Fair  Speech:  Slow  Volume:  Decreased  Mood:  Anxious  Affect:  Appropriate  Thought Process:  Goal Directed and Descriptions of Associations: Intact  Orientation:  Full (Time, Place, and Person)  Thought Content: Logical   Suicidal Thoughts:  No  Homicidal Thoughts:  No  Memory:  Immediate;   Fair Recent;   Fair Remote;   Fair  Judgement:  Fair  Insight:  Fair  Psychomotor Activity:  Normal  Concentration:  Concentration: Fair and Attention Span: Fair  Recall:  Good  Fund of Knowledge: Fair  Language: Fair  Akathisia:  No  Handed:  Right  AIMS (if indicated): not done  Assets:  Communication Skills Desire for Improvement Financial Resources/Insurance Housing Intimacy Social Support Transportation Vocational/Educational  ADL's:  Intact  Cognition: WNL  Sleep:  Excessive   Screenings: [facility] Visit from 08/03/2020 in [facility] Total Score 0      AUDIT    Flowsheet Row Admission (Discharged) from 01/22/2019 in [facility] Admission (Discharged) from 12/31/2018 in [place] Admission (Discharged) from  09/14/2016 in [facility]  Alcohol Use Disorder Identification Test Final Score (  AUDIT) 0 0 1      ECT-MADRS    Flowsheet Row Admission (Discharged) from 01/22/2019 in [place] Total Score 27      Mini-Mental    Flowsheet Row Admission (Discharged) from 01/22/2019 in [facility]  Total Score (max 30 points ) 30      PHQ2-9    Flowsheet Row Video Visit from 10/26/2020 in [facility] Video Visit from 09/16/2020 in [facility] Video Visit from 08/26/2020 in [facility] Office Visit from 08/03/2020 in [facility] Video Visit from 07/02/2020 in [place]  PHQ-2 Total Score 3 3 3 1 1   PHQ-9 Total Score 10 10 12  -- --      Flowsheet Row Video Visit from 10/26/2020 in [facility] Office Visit from 08/03/2020 in [facility] Video Visit from 07/02/2020 in [facility] Low Risk No Risk Low Risk        Assessment and Plan: Omair Dettmer is a 60 year old Caucasian male, married, on disability, history of multiple medical problems, MDD, GAD was evaluated by telemedicine today.  Patient is biologically predisposed given multiple health issues.  Patient with psychosocial stressors of the pandemic, his own health problems continues to manage medication management.  Plan  MDD in partial remission PHQ-9 today equals 10 Wellbutrin XL 300 mg p.o. daily Effexor extended release 150 mg p.o. daily Continue Zyprexa 15 mg p.o. nightly.  Patient is not interested in reducing the dosage further at this time although he does have possible increased appetite and weight gain.  He wants to give it more time.  He wants to work on sleep hygiene as well as work on his  diet.  GAD-stable Venlafaxine as prescribed Klonopin 0.5-1 mg as needed for severe anxiety attacks only. Continue CBT.  Insomnia-improving Belsomra 20 mg p.o nightly Patient currently struggles with excessive sleep during the day and will need to work on sleep hygiene. He is not interested in changing his medications at this time.  Weight gain-likely secondary to psychotropics-provided education.  Discussed referral to weight loss clinic.  Patient will discuss with primary care provider.  We will consider reducing the dosage of his olanzapine or tapering him off of it gradually.  Patient advised to be more active, get into an exercise program.  Follow-up in clinic in 4 weeks or sooner if needed.  This note was generated in part or whole with voice recognition software. Voice recognition is usually quite accurate but there are transcription errors that can and very often do occur. I apologize for any typographical errors that were not detected and corrected.      Ursula Alert, MD 10/27/2020, 6:22 PM

## 2020-10-28 ENCOUNTER — Other Ambulatory Visit: Payer: Self-pay

## 2020-10-28 ENCOUNTER — Ambulatory Visit (HOSPITAL_COMMUNITY): Payer: HMO | Admitting: Clinical

## 2020-10-28 ENCOUNTER — Telehealth (HOSPITAL_COMMUNITY): Payer: Self-pay | Admitting: Clinical

## 2020-10-28 NOTE — Telephone Encounter (Signed)
The patient did not respond to attempts to contact.

## 2020-10-29 ENCOUNTER — Other Ambulatory Visit: Payer: Self-pay | Admitting: Psychiatry

## 2020-10-29 DIAGNOSIS — F331 Major depressive disorder, recurrent, moderate: Secondary | ICD-10-CM

## 2020-10-29 DIAGNOSIS — F411 Generalized anxiety disorder: Secondary | ICD-10-CM

## 2020-11-02 DIAGNOSIS — E1029 Type 1 diabetes mellitus with other diabetic kidney complication: Secondary | ICD-10-CM | POA: Diagnosis not present

## 2020-11-02 DIAGNOSIS — R809 Proteinuria, unspecified: Secondary | ICD-10-CM | POA: Diagnosis not present

## 2020-11-03 ENCOUNTER — Telehealth (HOSPITAL_COMMUNITY): Payer: Self-pay | Admitting: Clinical

## 2020-11-03 NOTE — Telephone Encounter (Signed)
Called to schedule f/u, left vm

## 2020-11-11 ENCOUNTER — Ambulatory Visit (INDEPENDENT_AMBULATORY_CARE_PROVIDER_SITE_OTHER): Payer: HMO | Admitting: Clinical

## 2020-11-11 ENCOUNTER — Other Ambulatory Visit: Payer: Self-pay

## 2020-11-11 DIAGNOSIS — F332 Major depressive disorder, recurrent severe without psychotic features: Secondary | ICD-10-CM | POA: Diagnosis not present

## 2020-11-11 DIAGNOSIS — F411 Generalized anxiety disorder: Secondary | ICD-10-CM | POA: Diagnosis not present

## 2020-11-11 NOTE — Progress Notes (Signed)
Virtual Visit via Telephone Note   I connected with [name] on 11/11/20 at 3:00 PM EDT by telephone and verified that I am speaking with the correct person using two identifiers.   Location: Patient: Home Provider: Office   I discussed the limitations, risks, security and privacy concerns of performing an evaluation and management service by telephone and the availability of in person appointments. I also discussed with the patient that there may be a patient responsible charge related to this service. The patient expressed understanding and agreed to proceed.     THERAPIST PROGRESS NOTE   Session Time: 3:00PM-3:20PM   Participation Level: Active   Behavioral Response: CasualAlertIrratible   Type of Therapy: Individual Therapy   Treatment Goals addressed: Coping   Interventions: CBT, Solution Focused, Strength-based and Supportive   Summary: [name] is a 60 y.o. male who presents with  Major Depression and Generalized Anxiety. The OPT therapist worked with the patient for his ongoing OPT treatment. The OPT therapist utilized Motivational Interviewing to assist in creating therapeutic repore. The patient in the session was engaged and work in collaboration giving feedback about his triggers and symptoms over the past few weeks ir. The OPT therapist utilized Cognitive Behavioral Therapy through cognitive restructuring as well as worked with the patient on coping strategies to assist in management of Anxiety and Depression. The OPT therapist inquired for holistic care about the patients adherence to medication therapy. The patient intends to continue to give feedback about his sleep difficulty with his psychiatrist at his upcoming appointment.   Suicidal/Homicidal: Nowithout intent/plan   Therapist Response: The OPT therapist worked with the patient for the patients  scheduled session. The patient was engaged in his session and gave feedback in relation to triggers,  symptoms, and behavior responses over the past few weeks. The OPT therapist worked with the patient utilizing an in session Cognitive Behavioral Therapy exercise. The patient was responsive in the session and verbalized, " I still having trouble with my sleep my medicine seems like it takes to long to kick in and then I have a hard time getting up the next day".  The OPT therapist reviewed with the patient his appointment with Dr. Shea Evans .The OPT therapist additionally noted the patient may need to be seen by his PCP since he reported being sick/under the weather. The OPT therapist will continue treatment work with the patient in his next scheduled session.   Plan: Return again in 2/3 weeks.   Diagnosis:      Axis I:  Generalized Anxiety Disorder and Major Depression, Recurrent severe w/o psy features                           Axis II: No diagnosis     I discussed the assessment and treatment plan with the patient. The patient was provided an opportunity to ask questions and all were answered. The patient agreed with the plan and demonstrated an understanding of the instructions.   The patient was advised to call back or seek an in-person evaluation if the symptoms worsen or if the condition fails to improve as anticipated.   I provided 20 minutes of non-face-to-face time during this encounter.     [name], LCSW   11/11/2020

## 2020-11-14 ENCOUNTER — Other Ambulatory Visit: Payer: Self-pay | Admitting: Psychiatry

## 2020-11-14 DIAGNOSIS — F411 Generalized anxiety disorder: Secondary | ICD-10-CM

## 2020-11-18 DIAGNOSIS — E1029 Type 1 diabetes mellitus with other diabetic kidney complication: Secondary | ICD-10-CM | POA: Diagnosis not present

## 2020-11-18 DIAGNOSIS — R809 Proteinuria, unspecified: Secondary | ICD-10-CM | POA: Diagnosis not present

## 2020-11-19 DIAGNOSIS — Z430 Encounter for attention to tracheostomy: Secondary | ICD-10-CM | POA: Diagnosis not present

## 2020-11-25 ENCOUNTER — Other Ambulatory Visit: Payer: Self-pay

## 2020-11-25 ENCOUNTER — Telehealth (HOSPITAL_BASED_OUTPATIENT_CLINIC_OR_DEPARTMENT_OTHER): Payer: HMO | Admitting: Psychiatry

## 2020-11-25 ENCOUNTER — Encounter: Payer: Self-pay | Admitting: Psychiatry

## 2020-11-25 DIAGNOSIS — F5101 Primary insomnia: Secondary | ICD-10-CM | POA: Diagnosis not present

## 2020-11-25 DIAGNOSIS — R635 Abnormal weight gain: Secondary | ICD-10-CM

## 2020-11-25 DIAGNOSIS — F411 Generalized anxiety disorder: Secondary | ICD-10-CM

## 2020-11-25 DIAGNOSIS — F3342 Major depressive disorder, recurrent, in full remission: Secondary | ICD-10-CM | POA: Diagnosis not present

## 2020-11-25 DIAGNOSIS — T50905A Adverse effect of unspecified drugs, medicaments and biological substances, initial encounter: Secondary | ICD-10-CM | POA: Diagnosis not present

## 2020-11-25 MED ORDER — ZOLPIDEM TARTRATE 5 MG PO TABS: 5.0000 mg | ORAL_TABLET | Freq: Every evening | ORAL | 1 refills | Status: DC | PRN

## 2020-11-25 MED ORDER — CLONAZEPAM 1 MG PO TABS: ORAL_TABLET | ORAL | 1 refills | Status: DC

## 2020-11-25 NOTE — Progress Notes (Signed)
Virtual Visit via Video Note  I connected with [name] on 11/25/20 at  4:00 PM EDT by a video enabled telemedicine application and verified that I am speaking with the correct person using two identifiers.  Location Provider Location : [facility] Patient Location : Home  Participants: Patient , Wife,Provider   I discussed the limitations of evaluation and management by telemedicine and the availability of in person appointments. The patient expressed understanding and agreed to proceed.     I discussed the assessment and treatment plan with the patient. The patient was provided an opportunity to ask questions and all were answered. The patient agreed with the plan and demonstrated an understanding of the instructions.   The patient was advised to call back or seek an in-person evaluation if the symptoms worsen or if the condition fails to improve as anticipated.   [place] MD OP Progress Note  11/26/2020 9:54 AM [name]  MRN:  [id number]  Chief Complaint:  Chief Complaint   Follow-up; Depression; Anxiety    HPI: [name] is a 60 year old Caucasian male, married, lives in [place], has a history of MDD, GAD, insomnia, history of tracheal stenosis status post multiple reintubation, respiratory failure due to opioid overdose, history of CVA, right-sided hemiparesis, diabetes, hypertension, NSTEMI, hypothyroidism, chronic pain was evaluated by telemedicine today.  Patient today reports he is currently doing well with regards to his mood.  Denies any significant sadness.  Denies any appetite changes.  Reports concentration is overall okay.  Patient is compliant on medications.  Denies side effects.  Patient however reports he has been struggling with sleep.  He takes the Belsomra at around 10 or 11 however it takes him until 3 AM to fall asleep.  He tried taking it early and that also does not seem to work.  Patient however reports since he is sleeping late some  days he has been taking the Wellbutrin late.  There has been days when he took the Wellbutrin as late as 1 in the afternoon.  Unknown if this is likely contributing to his insomnia.  Patient denies any suicidality, homicidality or perceptual disturbances.  Patient continues to be motivated to stay in therapy.  Patient reports he spends his time working in the yard and is  planning to take a trip to the beach with his wife next week.  Patient denies any other concerns today.  Visit Diagnosis:    ICD-10-CM   1. MDD (major depressive disorder), recurrent, in full remission ([place])  F33.42     2. GAD (generalized anxiety disorder)  F41.1 clonazePAM (KLONOPIN) 1 MG tablet    3. Primary insomnia  F51.01 zolpidem (AMBIEN) 5 MG tablet    4. Weight gain due to medication  R63.5    T50.905A       Past Psychiatric History: Reviewed past psychiatric history from progress note on 01/15/2019.  Past trials of fluoxetine, Rexulti, Xanax, Abilify.  Patient completed TMS-04/05/2020.  Patient with multiple [place] sessions in the past.  Patient with history of ECT however did not tolerate it.  Patient with multiple inpatient mental health admissions in 2020.  Past suicide attempt in 2014.  Past Medical History:  Past Medical History:  Diagnosis Date   Depression    Diabetes ([place])    Insulin Pump   Diabetes mellitus type I ([place])    Diabetes mellitus without complication (HCC)    GERD (gastroesophageal reflux disease)    H/O laryngectomy    Heel bone fracture  Hyperlipidemia    Hypertension    Radicular pain of right lower extremity    Stroke [facility])    Suicide attempt [facility]) 2014   damaged larynx - tracheostomy   Thyroid disease     Past Surgical History:  Procedure Laterality Date   COLONOSCOPY WITH PROPOFOL N/A 05/15/2018   Procedure: COLONOSCOPY WITH PROPOFOL;  Surgeon: [name], [name], MD;  Location: [facility] ENDOSCOPY;  Service: Gastroenterology;  Laterality: N/A;   ESOPHAGOGASTRODUODENOSCOPY N/A  05/15/2018   Procedure: ESOPHAGOGASTRODUODENOSCOPY (EGD);  Surgeon: [name], [name], MD;  Location: [facility] ENDOSCOPY;  Service: Gastroenterology;  Laterality: N/A;   FRACTURE SURGERY     Heel bone reconstruction Left    HERNIA REPAIR  [id number]   Umbilical hernia repair    LARYNGECTOMY     NECK SURGERY     fusion   SPINE SURGERY     TRACHEOSTOMY  2014   from [place] attempt    Family Psychiatric History: Reviewed family psychiatric history from progress note on 01/15/2019  Family History:  Family History  Problem Relation Age of Onset   Osteoporosis Mother    Diabetes Mother    Hypertension Father    Mental illness Neg Hx     Social History: Reviewed social history from progress note on 01/15/2019 Social History   Socioeconomic History   Marital status: Married    Spouse name: Not on file   Number of children: Not on file   Years of education: Not on file   Highest education level: Not on file  Occupational History   Not on file  Tobacco Use   Smoking status: Former    Packs/day: 0.00    Types: Cigarettes    Quit date: 04/09/2013    Years since quitting: 7.6   Smokeless tobacco: Never  Vaping Use   Vaping Use: Never used  Substance and Sexual Activity   Alcohol use: Yes    Alcohol/week: 2.0 standard drinks    Types: 2 Shots of liquor per week    Comment: rare   Drug use: No    Comment: Pt denied; UDS not available   Sexual activity: Yes    Partners: Female    Birth control/protection: Condom  Other Topics Concern   Not on file  Social History Narrative   Not on file   Social Determinants of Health   Financial Resource Strain: Not on file  Food Insecurity: Not on file  Transportation Needs: Not on file  Physical Activity: Not on file  Stress: Not on file  Social Connections: Not on file    Allergies:  Allergies  Allergen Reactions   Buspar [Buspirone]     Makes the patient "flip out"   Depakote [Valproic Acid]     Causes excessive drowsiness   Clopidogrel  Rash   Gabapentin Itching and Rash    Metabolic Disorder Labs: Lab Results  Component Value Date   HGBA1C 7.8 (H) 01/01/2019   MPG 177.16 01/01/2019   MPG 174.29 12/23/2018   No results found for: PROLACTIN Lab Results  Component Value Date   CHOL 161 09/15/2016   TRIG 190 (H) 09/15/2016   HDL 41 09/15/2016   CHOLHDL 3.9 09/15/2016   VLDL 38 09/15/2016   [place] 82 09/15/2016   Lab Results  Component Value Date   TSH 1.952 09/15/2016    Therapeutic Level Labs: No results found for: LITHIUM Lab Results  Component Value Date   VALPROATE 19 (L) 09/12/2016   No components found for:  CBMZ  Current Medications: Current Outpatient Medications  Medication Sig Dispense Refill   zolpidem (AMBIEN) 5 MG tablet Take 1 tablet (5 mg total) by mouth at bedtime as needed for sleep. 15 tablet 1   amLODipine (NORVASC) 5 MG tablet Take 1 tablet (5 mg total) by mouth daily. 30 tablet 1   buPROPion (WELLBUTRIN XL) 300 MG 24 hr tablet Take 1 tablet (300 mg total) by mouth daily. 90 tablet 0   [START ON 12/09/2020] clonazePAM (KLONOPIN) 1 MG tablet TAKE 1/2 TO 1 TABLET(0.5 TO 1 MG) BY MOUTH DAILY AS NEEDED FOR ANXIETY OR SEVERE ANXIETY 30 tablet 1   empagliflozin (JARDIANCE) 25 MG TABS tablet Take 25 mg by mouth daily. 30 tablet 1   FLUCELVAX QUADRIVALENT 0.5 ML injection      glycopyrrolate (ROBINUL) 1 MG tablet Take by mouth.     insulin aspart (NOVOLOG) 100 UNIT/ML injection INJECT UP TO 120 UNITS UNDER THE SKIN AS DIRECTED DAILY VIA INSULIN PUMP     levothyroxine (SYNTHROID) 75 MCG tablet TAKE 1 TABLET BY MOUTH DAILY AT 6 AM 90 tablet 0   losartan-hydrochlorothiazide (HYZAAR) 100-25 MG tablet TAKE 1 TABLET BY MOUTH EVERY DAY     OLANZapine (ZYPREXA) 15 MG tablet Take 1 tablet (15 mg total) by mouth at bedtime. 30 tablet 1   pantoprazole (PROTONIX) 40 MG tablet TAKE 1 TABLET(40 MG) BY MOUTH TWICE DAILY     rosuvastatin (CRESTOR) 20 MG tablet Take 20 mg by mouth at bedtime.     venlafaxine  XR (EFFEXOR XR) 150 MG 24 hr capsule Take 1 capsule (150 mg total) by mouth daily with breakfast. 30 capsule 1   No current facility-administered medications for this visit.     Musculoskeletal: Strength & Muscle Tone:  UTA Gait & Station:  UTA Patient leans: N/A  Psychiatric Specialty Exam: Review of Systems  Psychiatric/Behavioral:  Positive for sleep disturbance.   All other systems reviewed and are negative.  There were no vitals taken for this visit.There is no height or weight on file to calculate BMI.  General Appearance: Fairly Groomed  Eye Contact:  Fair  Speech:  Slow- Baseline  Volume:  Decreased  Mood:  Euthymic  Affect:  Congruent  Thought Process:  Goal Directed and Descriptions of Associations: Intact  Orientation:  Full (Time, Place, and Person)  Thought Content: Logical   Suicidal Thoughts:  No  Homicidal Thoughts:  No  Memory:  Immediate;   Fair Recent;   Fair Remote;   Fair  Judgement:  Fair  Insight:  Fair  Psychomotor Activity:  Normal  Concentration:  Concentration: Fair and Attention Span: Fair  Recall:  [employer] of Knowledge: Fair  Language: Fair  Akathisia:  No  Handed:  Right  AIMS (if indicated): not done  Assets:  Communication Skills Desire for Improvement Housing Intimacy Social Support Talents/Skills  ADL's:  Intact  Cognition: WNL  Sleep:  Poor   Screenings: [place] Office Visit from 08/03/2020 in [place] Total Score 0      AUDIT    Flowsheet Row Admission (Discharged) from 01/22/2019 in [place] Admission (Discharged) from 12/31/2018 in [place] Admission (Discharged) from 09/14/2016 in [place]  Alcohol Use Disorder Identification Test Final Score (AUDIT) 0 0 1      ECT-MADRS    Flowsheet Row Admission (Discharged) from 01/22/2019 in [place] Total Score 27  Mini-Mental    Flowsheet Row Admission (Discharged) from 01/22/2019 in [name]  Total Score (max 30 points ) 30      PHQ2-9    Flowsheet Row Video Visit from 11/25/2020 in [place] Video Visit from 10/26/2020 in [place] Video Visit from 09/16/2020 in [place] Video Visit from 08/26/2020 in [place] Office Visit from 08/03/2020 in [place]  PHQ-2 Total Score 0 '3 3 3 1  '$ PHQ-9 Total Score '5 10 10 12 '$ --      Flowsheet Row Video Visit from 11/25/2020 in [place] Video Visit from 10/26/2020 in [place] Office Visit from 08/03/2020 in [place] Low Risk Low Risk No Risk        Assessment and Plan: [name] is a 60 year old Caucasian male, married, on disability, history of multiple medical problems, MDD, GAD was evaluated by telemedicine today.  Patient is biologically predisposed given multiple health issues.  Patient with psychosocial stressors of the pandemic, his own health problems currently struggling with sleep.  Plan as noted below.  Plan MDD in remission PHQ-9 today equals 5 Wellbutrin XL 300 mg p.o. daily Effexor extended release 150 mg p.o. daily Continue Zyprexa at reduced dosage of 15 mg p.o. nightly   GAD-stable Venlafaxine as prescribed Klonopin 0.5-1 mg as needed for severe anxiety attacks only I have reviewed [place] PMP aware Continue CBT  Insomnia-unstable Discussed with patient to take the Wellbutrin XL early in the morning since it can contribute to insomnia. We will consider reducing the dosage of Wellbutrin if sleep continues to be a problem. Discontinue Belsomra. Start Ambien 5 mg p.o. nightly Provided medication education. Patient to continue to work  on sleep hygiene.  Weight gain-likely secondary to psychotropics-provided education. We will consider reducing the dosage of olanzapine if patient continues to remain stable.  Follow-up in clinic in 2 to 3 weeks or sooner if needed.  This note was generated in part or whole with voice recognition software. Voice recognition is usually quite accurate but there are transcription errors that can and very often do occur. I apologize for any typographical errors that were not detected and corrected.    Ursula Alert, MD 11/26/2020, 9:54 AM

## 2020-12-09 ENCOUNTER — Ambulatory Visit (INDEPENDENT_AMBULATORY_CARE_PROVIDER_SITE_OTHER): Payer: HMO | Admitting: Clinical

## 2020-12-09 ENCOUNTER — Other Ambulatory Visit: Payer: Self-pay

## 2020-12-09 DIAGNOSIS — F332 Major depressive disorder, recurrent severe without psychotic features: Secondary | ICD-10-CM

## 2020-12-09 DIAGNOSIS — F411 Generalized anxiety disorder: Secondary | ICD-10-CM

## 2020-12-09 NOTE — Progress Notes (Signed)
Virtual Visit via Telephone Note   I connected with [name] on 12/09/20 at 2:00 PM EDT by telephone and verified that I am speaking with the correct person using two identifiers.   Location: Patient: Home Provider: Office   I discussed the limitations, risks, security and privacy concerns of performing an evaluation and management service by telephone and the availability of in person appointments. I also discussed with the patient that there may be a patient responsible charge related to this service. The patient expressed understanding and agreed to proceed.     THERAPIST PROGRESS NOTE   Session Time: 2:15PM-2:45PM   Participation Level: Active   Behavioral Response: CasualAlertIrratible   Type of Therapy: Individual Therapy   Treatment Goals addressed: Coping   Interventions: CBT, Solution Focused, Strength-based and Supportive   Summary: [name] is a 60 y.o. male who presents with  Major Depression and Generalized Anxiety. The OPT therapist worked with the patient for his ongoing OPT treatment. The OPT therapist utilized Motivational Interviewing to assist in creating therapeutic repore. The patient in the session was engaged and work in collaboration giving feedback about his triggers and symptoms over the past few weeks. The patient spoke about his recent vacation to [facility] and celebrating of his wife's birthday The OPT therapist utilized Cognitive Behavioral Therapy through cognitive restructuring as well as worked with the patient on coping strategies to assist in management of Anxiety and Depression. The OPT therapist inquired for holistic care about the patients adherence to medication therapy. The patient spoke about using Ambien and this change working well for his difficulty with sleep.   Suicidal/Homicidal: Nowithout intent/plan   Therapist Response: The OPT therapist worked with the patient for the patients  scheduled session. The patient was  engaged in his session and gave feedback in relation to triggers, symptoms, and behavior responses over the past few weeks. The OPT therapist worked with the patient utilizing an in session Cognitive Behavioral Therapy exercise. The patient was responsive in the session and verbalized, " I have been doing better we just got back from the beach, the Ambien has been helping with my sleep so I have more energy".  The OPT therapist reviewed with the patient his upcoming appointment with Dr. Shea Evans .The OPT therapist reviewed with the patient his ongoing work to find part time employment. The OPT therapist will continue treatment work with the patient in his next scheduled session.   Plan: Return again in 2/3 weeks.   Diagnosis:      Axis I:  Generalized Anxiety Disorder and Major Depression, Recurrent severe w/o psy features                           Axis II: No diagnosis     I discussed the assessment and treatment plan with the patient. The patient was provided an opportunity to ask questions and all were answered. The patient agreed with the plan and demonstrated an understanding of the instructions.   The patient was advised to call back or seek an in-person evaluation if the symptoms worsen or if the condition fails to improve as anticipated.   I provided 30 minutes of non-face-to-face time during this encounter.     [name], LCSW   12/09/2020

## 2020-12-15 ENCOUNTER — Telehealth (INDEPENDENT_AMBULATORY_CARE_PROVIDER_SITE_OTHER): Payer: HMO | Admitting: Psychiatry

## 2020-12-15 ENCOUNTER — Other Ambulatory Visit: Payer: Self-pay

## 2020-12-15 DIAGNOSIS — F411 Generalized anxiety disorder: Secondary | ICD-10-CM

## 2020-12-15 DIAGNOSIS — F332 Major depressive disorder, recurrent severe without psychotic features: Secondary | ICD-10-CM

## 2020-12-15 NOTE — Progress Notes (Signed)
Patient advised to reschedule this appointment.  Patient had connection problem.

## 2020-12-17 ENCOUNTER — Other Ambulatory Visit: Payer: Self-pay | Admitting: Psychiatry

## 2020-12-17 DIAGNOSIS — F3341 Major depressive disorder, recurrent, in partial remission: Secondary | ICD-10-CM

## 2020-12-17 DIAGNOSIS — F411 Generalized anxiety disorder: Secondary | ICD-10-CM

## 2020-12-21 ENCOUNTER — Other Ambulatory Visit: Payer: Self-pay | Admitting: Psychiatry

## 2020-12-21 DIAGNOSIS — F5101 Primary insomnia: Secondary | ICD-10-CM

## 2020-12-24 DIAGNOSIS — Z430 Encounter for attention to tracheostomy: Secondary | ICD-10-CM | POA: Diagnosis not present

## 2020-12-28 DIAGNOSIS — E1029 Type 1 diabetes mellitus with other diabetic kidney complication: Secondary | ICD-10-CM | POA: Diagnosis not present

## 2020-12-28 DIAGNOSIS — R809 Proteinuria, unspecified: Secondary | ICD-10-CM | POA: Diagnosis not present

## 2020-12-30 DIAGNOSIS — Z03818 Encounter for observation for suspected exposure to other biological agents ruled out: Secondary | ICD-10-CM | POA: Diagnosis not present

## 2020-12-30 DIAGNOSIS — R509 Fever, unspecified: Secondary | ICD-10-CM | POA: Diagnosis not present

## 2021-01-06 ENCOUNTER — Encounter: Payer: Self-pay | Admitting: Psychiatry

## 2021-01-06 ENCOUNTER — Telehealth (HOSPITAL_BASED_OUTPATIENT_CLINIC_OR_DEPARTMENT_OTHER): Payer: HMO | Admitting: Psychiatry

## 2021-01-06 ENCOUNTER — Other Ambulatory Visit: Payer: Self-pay

## 2021-01-06 DIAGNOSIS — T50905A Adverse effect of unspecified drugs, medicaments and biological substances, initial encounter: Secondary | ICD-10-CM | POA: Diagnosis not present

## 2021-01-06 DIAGNOSIS — R635 Abnormal weight gain: Secondary | ICD-10-CM | POA: Diagnosis not present

## 2021-01-06 DIAGNOSIS — F411 Generalized anxiety disorder: Secondary | ICD-10-CM

## 2021-01-06 DIAGNOSIS — F5101 Primary insomnia: Secondary | ICD-10-CM

## 2021-01-06 DIAGNOSIS — F3342 Major depressive disorder, recurrent, in full remission: Secondary | ICD-10-CM

## 2021-01-06 MED ORDER — BUPROPION HCL ER (XL) 300 MG PO TB24: 300.0000 mg | ORAL_TABLET | Freq: Every day | ORAL | 1 refills | Status: DC

## 2021-01-06 MED ORDER — ZOLPIDEM TARTRATE 10 MG PO TABS: 10.0000 mg | ORAL_TABLET | Freq: Every evening | ORAL | 1 refills | Status: DC | PRN

## 2021-01-06 NOTE — Progress Notes (Signed)
Virtual Visit via Video Note  I connected with [name] on 01/06/21 at 11:00 AM EDT by a video enabled telemedicine application and verified that I am speaking with the correct person using two identifiers.  Location Provider Location : [facility] Patient Location : Home  Participants: Patient ,Spouse, Provider    I discussed the limitations of evaluation and management by telemedicine and the availability of in person appointments. The patient expressed understanding and agreed to proceed.    I discussed the assessment and treatment plan with the patient. The patient was provided an opportunity to ask questions and all were answered. The patient agreed with the plan and demonstrated an understanding of the instructions.   The patient was advised to call back or seek an in-person evaluation if the symptoms worsen or if the condition fails to improve as anticipated.   [place] MD OP Progress Note  01/07/2021 8:12 AM [name]  MRN:  [id number]  Chief Complaint:  Chief Complaint   Follow-up; Anxiety; Insomnia    HPI: [name] is a 60 year old Caucasian male, married, lives in [place], has a history of MDD, GAD, insomnia, history of tracheal stenosis status post multiple reintubation, respiratory failure due to opioid overdose, history of CVA, right-sided hemiparesis, diabetes, hypertension, NSTEMI, hypothyroidism, chronic pain was evaluated by telemedicine today.  Patient today reports overall mood symptoms are stable.  This is the best he has felt in a long time.  He reports he has been active doing a lot of things.  However he has been struggling with left foot numbness since the past few weeks, getting worse.  Patient reports he is planning to follow up with his primary provider regarding the same.  He does continue to struggle with sleep.  He reports the Ambien 5 mg helps to some extent and he is interested in a dose increase.  He also ran out of Ambien the past  few days since he reports he could not get it refilled.  Patient reports he continues to follow-up with therapist and therapy sessions are beneficial.  Patient recently had fever, tested negative for COVID-19.  Reports he is currently better.  Denies suicidality.  Denies homicidality.  Denies perceptual disturbances.  Visit Diagnosis:    ICD-10-CM   1. MDD (major depressive disorder), recurrent, in full remission ([place])  F33.42     2. GAD (generalized anxiety disorder)  F41.1     3. Primary insomnia  F51.01 zolpidem (AMBIEN) 10 MG tablet    4. Weight gain due to medication  R63.5 buPROPion (WELLBUTRIN XL) 300 MG 24 hr tablet   T50.905A    likely due to psychotropics      Past Psychiatric History: Reviewed past psychiatric history from progress note on 01/15/2019.  Past trials of fluoxetine, Rexulti, Xanax, Abilify.  Patient completed TMS-04/05/2020.  Patient with [place] sessions in the past.  Patient with history of ECT-did not tolerate it.  Patient with multiple inpatient mental health admissions in 2020.  Past suicide attempt-2014.  Past Medical History:  Past Medical History:  Diagnosis Date   Depression    Diabetes ([place])    Insulin Pump   Diabetes mellitus type I ([place])    Diabetes mellitus without complication (HCC)    GERD (gastroesophageal reflux disease)    H/O laryngectomy    Heel bone fracture    Hyperlipidemia    Hypertension    Radicular pain of right lower extremity    Stroke [facility])    Suicide attempt (  [place]) 2014   damaged larynx - tracheostomy   Thyroid disease     Past Surgical History:  Procedure Laterality Date   COLONOSCOPY WITH PROPOFOL N/A 05/15/2018   Procedure: COLONOSCOPY WITH PROPOFOL;  Surgeon: [name], [name], MD;  Location: [facility] ENDOSCOPY;  Service: Gastroenterology;  Laterality: N/A;   ESOPHAGOGASTRODUODENOSCOPY N/A 05/15/2018   Procedure: ESOPHAGOGASTRODUODENOSCOPY (EGD);  Surgeon: [name], [name], MD;  Location: [facility] ENDOSCOPY;  Service:  Gastroenterology;  Laterality: N/A;   FRACTURE SURGERY     Heel bone reconstruction Left    HERNIA REPAIR  [id number]   Umbilical hernia repair    LARYNGECTOMY     NECK SURGERY     fusion   SPINE SURGERY     TRACHEOSTOMY  2014   from [place] attempt    Family Psychiatric History: Reviewed family psychiatric history from progress note on 01/15/2019  Family History:  Family History  Problem Relation Age of Onset   Osteoporosis Mother    Diabetes Mother    Hypertension Father    Mental illness Neg Hx     Social History: Reviewed social history from progress note on 01/15/2019 Social History   Socioeconomic History   Marital status: Married    Spouse name: Not on file   Number of children: Not on file   Years of education: Not on file   Highest education level: Not on file  Occupational History   Not on file  Tobacco Use   Smoking status: Former    Packs/day: 0.00    Types: Cigarettes    Quit date: 04/09/2013    Years since quitting: 7.7   Smokeless tobacco: Never  Vaping Use   Vaping Use: Never used  Substance and Sexual Activity   Alcohol use: Yes    Alcohol/week: 2.0 standard drinks    Types: 2 Shots of liquor per week    Comment: rare   Drug use: No    Comment: Pt denied; UDS not available   Sexual activity: Yes    Partners: Female    Birth control/protection: Condom  Other Topics Concern   Not on file  Social History Narrative   Not on file   Social Determinants of Health   Financial Resource Strain: Not on file  Food Insecurity: Not on file  Transportation Needs: Not on file  Physical Activity: Not on file  Stress: Not on file  Social Connections: Not on file    Allergies:  Allergies  Allergen Reactions   Buspar [Buspirone]     Makes the patient "flip out"   Depakote [Valproic Acid]     Causes excessive drowsiness   Clopidogrel Rash   Gabapentin Itching and Rash    Metabolic Disorder Labs: Lab Results  Component Value Date   HGBA1C 7.8 (H)  01/01/2019   MPG 177.16 01/01/2019   MPG 174.29 12/23/2018   No results found for: PROLACTIN Lab Results  Component Value Date   CHOL 161 09/15/2016   TRIG 190 (H) 09/15/2016   HDL 41 09/15/2016   CHOLHDL 3.9 09/15/2016   VLDL 38 09/15/2016   LDLCALC 82 09/15/2016   Lab Results  Component Value Date   TSH 1.952 09/15/2016    Therapeutic Level Labs: No results found for: LITHIUM Lab Results  Component Value Date   VALPROATE 19 (L) 09/12/2016   No components found for:  CBMZ  Current Medications: Current Outpatient Medications  Medication Sig Dispense Refill   zolpidem (AMBIEN) 10 MG tablet Take 1 tablet (10 mg total)  by mouth at bedtime as needed for sleep. 30 tablet 1   amLODipine (NORVASC) 5 MG tablet Take 1 tablet (5 mg total) by mouth daily. 30 tablet 1   buPROPion (WELLBUTRIN XL) 300 MG 24 hr tablet Take 1 tablet (300 mg total) by mouth daily. 90 tablet 1   chlorhexidine (PERIDEX) 0.12 % solution 2 (two) times daily.     clonazePAM (KLONOPIN) 1 MG tablet TAKE 1/2 TO 1 TABLET(0.5 TO 1 MG) BY MOUTH DAILY AS NEEDED FOR ANXIETY OR SEVERE ANXIETY 30 tablet 1   empagliflozin (JARDIANCE) 25 MG TABS tablet Take 25 mg by mouth daily. 30 tablet 1   FLUCELVAX QUADRIVALENT 0.5 ML injection      glycopyrrolate (ROBINUL) 1 MG tablet Take by mouth.     insulin aspart (NOVOLOG) 100 UNIT/ML injection INJECT UP TO 120 UNITS UNDER THE SKIN AS DIRECTED DAILY VIA INSULIN PUMP     levothyroxine (SYNTHROID) 75 MCG tablet TAKE 1 TABLET BY MOUTH DAILY AT 6 AM 90 tablet 0   losartan-hydrochlorothiazide (HYZAAR) 100-25 MG tablet TAKE 1 TABLET BY MOUTH EVERY DAY     OLANZapine (ZYPREXA) 15 MG tablet TAKE 1 TABLET(15 MG) BY MOUTH AT BEDTIME 30 tablet 1   pantoprazole (PROTONIX) 40 MG tablet TAKE 1 TABLET(40 MG) BY MOUTH TWICE DAILY     rosuvastatin (CRESTOR) 20 MG tablet Take 20 mg by mouth at bedtime.     venlafaxine XR (EFFEXOR-XR) 150 MG 24 hr capsule TAKE 1 CAPSULE(150 MG) BY MOUTH DAILY WITH  BREAKFAST 30 capsule 1   No current facility-administered medications for this visit.     Musculoskeletal: Strength & Muscle Tone:  UTA Gait & Station:  UTA Patient leans: N/A  Psychiatric Specialty Exam: Review of Systems  Neurological:  Positive for numbness (left foot).  Psychiatric/Behavioral:  Positive for sleep disturbance.    There were no vitals taken for this visit.There is no height or weight on file to calculate BMI.  General Appearance: Casual  Eye Contact:  Fair  Speech:  slow - baseline  Volume:  decreased  Mood:  Euthymic  Affect:  Congruent  Thought Process:  Goal Directed and Descriptions of Associations: Intact  Orientation:  Full (Time, Place, and Person)  Thought Content: Logical   Suicidal Thoughts:  No  Homicidal Thoughts:  No  Memory:  Immediate;   Fair Recent;   Fair Remote;   Fair  Judgement:  Fair  Insight:  Fair  Psychomotor Activity:  Normal  Concentration:  Concentration: Fair and Attention Span: Fair  Recall:  [employer] of Knowledge: Fair  Language: Fair  Akathisia:  No  Handed:  Right  AIMS (if indicated): done  Assets:  Communication Skills Desire for Improvement Housing Social Support  ADL's:  Intact  Cognition: WNL  Sleep:  Poor   Screenings: [place] Office Visit from 08/03/2020 in [place] Total Score 0      AUDIT    Flowsheet Row Admission (Discharged) from 01/22/2019 in [place] Admission (Discharged) from 12/31/2018 in [place] Admission (Discharged) from 09/14/2016 in [place]  Alcohol Use Disorder Identification Test Final Score (AUDIT) 0 0 1      ECT-MADRS    Flowsheet Row Admission (Discharged) from 01/22/2019 in [place] Total Score 27      Mini-Mental    [place] Admission (Discharged) from 01/22/2019 in [place]  Total Score (max 30 points ) 30      PHQ2-9    Flowsheet Row Video Visit from 11/25/2020 in [place] Video Visit from 10/26/2020 in [place] Video Visit from 09/16/2020 in [place] Video Visit from 08/26/2020 in [place] Office Visit from 08/03/2020 in [place]  PHQ-2 Total Score 0 '3 3 3 1  '$ PHQ-9 Total Score '5 10 10 12 '$ --      Flowsheet Row Video Visit from 11/25/2020 in [place] Video Visit from 10/26/2020 in [place] Office Visit from 08/03/2020 in [place] Low Risk Low Risk No Risk        Assessment and Plan: Cam Sadusky is a 60 year old Caucasian male, married, on disability, history of multiple medical problems, MDD, GAD, was evaluated by telemedicine today.  Patient with psychosocial stressors of the pandemic, continues to struggle with sleep although his depression is currently stable.  Plan as noted below.  Plan MDD in remission Wellbutrin XL 300 mg p.o. daily Effexor extended release 150 mg p.o. daily Zyprexa 15 mg p.o. nightly AIMS - 0  GAD-stable Venlafaxine as prescribed Klonopin 0.5-1 mg as needed for severe anxiety attacks only I have reviewed [place] PMP AWARE. Continue CBT  Insomnia-unstable Increase Ambien to 10 mg p.o. nightly Reviewed [place] PMP aware Wellbutrin XL 300 mg p.o. daily to be taken in the morning.   Weight gain-likely due to psychotropics-unstable Provided education. Patient to manage diet, stay active.  Reviewed notes for internal medicine provider-Ms.Bounvilay -dated 12/30/2020-patient treated for fever, unspecified fever.  Follow-up in clinic in 3 -4 weeks or sooner if needed.  This note was generated in part or whole with voice recognition software. Voice recognition is  usually quite accurate but there are transcription errors that can and very often do occur. I apologize for any typographical errors that were not detected and corrected.      Ursula Alert, MD 01/07/2021, 8:12 AM

## 2021-01-11 DIAGNOSIS — I739 Peripheral vascular disease, unspecified: Secondary | ICD-10-CM | POA: Diagnosis not present

## 2021-01-12 DIAGNOSIS — E1069 Type 1 diabetes mellitus with other specified complication: Secondary | ICD-10-CM | POA: Diagnosis not present

## 2021-01-12 DIAGNOSIS — I152 Hypertension secondary to endocrine disorders: Secondary | ICD-10-CM | POA: Diagnosis not present

## 2021-01-12 DIAGNOSIS — E1029 Type 1 diabetes mellitus with other diabetic kidney complication: Secondary | ICD-10-CM | POA: Diagnosis not present

## 2021-01-12 DIAGNOSIS — I739 Peripheral vascular disease, unspecified: Secondary | ICD-10-CM | POA: Diagnosis not present

## 2021-01-12 DIAGNOSIS — E785 Hyperlipidemia, unspecified: Secondary | ICD-10-CM | POA: Diagnosis not present

## 2021-01-12 DIAGNOSIS — E1159 Type 2 diabetes mellitus with other circulatory complications: Secondary | ICD-10-CM | POA: Diagnosis not present

## 2021-01-12 DIAGNOSIS — R809 Proteinuria, unspecified: Secondary | ICD-10-CM | POA: Diagnosis not present

## 2021-01-24 ENCOUNTER — Other Ambulatory Visit (INDEPENDENT_AMBULATORY_CARE_PROVIDER_SITE_OTHER): Payer: Self-pay | Admitting: Nurse Practitioner

## 2021-01-24 DIAGNOSIS — I739 Peripheral vascular disease, unspecified: Secondary | ICD-10-CM

## 2021-01-25 ENCOUNTER — Other Ambulatory Visit: Payer: Self-pay

## 2021-01-25 ENCOUNTER — Ambulatory Visit (INDEPENDENT_AMBULATORY_CARE_PROVIDER_SITE_OTHER): Payer: HMO | Admitting: Nurse Practitioner

## 2021-01-25 ENCOUNTER — Other Ambulatory Visit (INDEPENDENT_AMBULATORY_CARE_PROVIDER_SITE_OTHER): Payer: Self-pay | Admitting: Vascular Surgery

## 2021-01-25 ENCOUNTER — Ambulatory Visit (INDEPENDENT_AMBULATORY_CARE_PROVIDER_SITE_OTHER): Payer: HMO

## 2021-01-25 VITALS — BP 142/85 | HR 89 | Ht 72.0 in | Wt 281.0 lb

## 2021-01-25 DIAGNOSIS — Z430 Encounter for attention to tracheostomy: Secondary | ICD-10-CM | POA: Diagnosis not present

## 2021-01-25 DIAGNOSIS — I10 Essential (primary) hypertension: Secondary | ICD-10-CM

## 2021-01-25 DIAGNOSIS — I739 Peripheral vascular disease, unspecified: Secondary | ICD-10-CM

## 2021-01-25 DIAGNOSIS — E785 Hyperlipidemia, unspecified: Secondary | ICD-10-CM | POA: Diagnosis not present

## 2021-01-25 DIAGNOSIS — I70223 Atherosclerosis of native arteries of extremities with rest pain, bilateral legs: Secondary | ICD-10-CM

## 2021-01-25 MED ORDER — OXYCODONE-ACETAMINOPHEN 10-325 MG PO TABS
1.0000 | ORAL_TABLET | ORAL | 0 refills | Status: DC | PRN
Start: 2021-01-25 — End: 2021-02-15

## 2021-01-26 ENCOUNTER — Telehealth (INDEPENDENT_AMBULATORY_CARE_PROVIDER_SITE_OTHER): Payer: Self-pay

## 2021-01-26 ENCOUNTER — Encounter (INDEPENDENT_AMBULATORY_CARE_PROVIDER_SITE_OTHER): Payer: Self-pay

## 2021-01-26 NOTE — Telephone Encounter (Signed)
Patient return call and was made aware with pre-procedure instructions. Pre-procedure instructions will mailed out to the patient.

## 2021-01-26 NOTE — Telephone Encounter (Signed)
Patient is schedule left le angiogram with Dr Lucky Cowboy on 01/31/2021 arrival time 9:15 am and right le angio on 02/14/2021 with arrival time at 6:45 am at the [facility]. I left a message on patient voicemail to return a call to the office.

## 2021-01-27 ENCOUNTER — Other Ambulatory Visit: Payer: Self-pay | Admitting: Psychiatry

## 2021-01-27 DIAGNOSIS — F411 Generalized anxiety disorder: Secondary | ICD-10-CM

## 2021-01-27 DIAGNOSIS — E1029 Type 1 diabetes mellitus with other diabetic kidney complication: Secondary | ICD-10-CM | POA: Diagnosis not present

## 2021-01-27 DIAGNOSIS — R809 Proteinuria, unspecified: Secondary | ICD-10-CM | POA: Diagnosis not present

## 2021-01-28 NOTE — Telephone Encounter (Signed)
I have sent Klonopin to pharmacy. 

## 2021-01-31 ENCOUNTER — Other Ambulatory Visit: Payer: Self-pay

## 2021-01-31 ENCOUNTER — Encounter: Payer: Self-pay | Admitting: Vascular Surgery

## 2021-01-31 ENCOUNTER — Ambulatory Visit
Admission: RE | Admit: 2021-01-31 | Discharge: 2021-01-31 | Disposition: A | Discharge status: Home / Self Care | Payer: HMO | Source: Ambulatory Visit | Attending: Vascular Surgery | Admitting: Vascular Surgery

## 2021-01-31 ENCOUNTER — Encounter
Admission: RE | Disposition: A | Discharge status: Home / Self Care | Payer: Self-pay | Source: Ambulatory Visit | Attending: Vascular Surgery

## 2021-01-31 ENCOUNTER — Other Ambulatory Visit (INDEPENDENT_AMBULATORY_CARE_PROVIDER_SITE_OTHER): Payer: Self-pay | Admitting: Nurse Practitioner

## 2021-01-31 ENCOUNTER — Encounter (INDEPENDENT_AMBULATORY_CARE_PROVIDER_SITE_OTHER): Payer: Self-pay | Admitting: Nurse Practitioner

## 2021-01-31 DIAGNOSIS — Z7984 Long term (current) use of oral hypoglycemic drugs: Secondary | ICD-10-CM | POA: Diagnosis not present

## 2021-01-31 DIAGNOSIS — I70222 Atherosclerosis of native arteries of extremities with rest pain, left leg: Secondary | ICD-10-CM | POA: Diagnosis not present

## 2021-01-31 DIAGNOSIS — I10 Essential (primary) hypertension: Secondary | ICD-10-CM | POA: Insufficient documentation

## 2021-01-31 DIAGNOSIS — Z87891 Personal history of nicotine dependence: Secondary | ICD-10-CM | POA: Diagnosis not present

## 2021-01-31 DIAGNOSIS — Z794 Long term (current) use of insulin: Secondary | ICD-10-CM | POA: Diagnosis not present

## 2021-01-31 DIAGNOSIS — E785 Hyperlipidemia, unspecified: Secondary | ICD-10-CM | POA: Insufficient documentation

## 2021-01-31 DIAGNOSIS — I70223 Atherosclerosis of native arteries of extremities with rest pain, bilateral legs: Secondary | ICD-10-CM | POA: Insufficient documentation

## 2021-01-31 DIAGNOSIS — E1051 Type 1 diabetes mellitus with diabetic peripheral angiopathy without gangrene: Secondary | ICD-10-CM | POA: Diagnosis not present

## 2021-01-31 DIAGNOSIS — Z8249 Family history of ischemic heart disease and other diseases of the circulatory system: Secondary | ICD-10-CM | POA: Insufficient documentation

## 2021-01-31 DIAGNOSIS — Z888 Allergy status to other drugs, medicaments and biological substances status: Secondary | ICD-10-CM | POA: Insufficient documentation

## 2021-01-31 DIAGNOSIS — Z79899 Other long term (current) drug therapy: Secondary | ICD-10-CM | POA: Insufficient documentation

## 2021-01-31 DIAGNOSIS — I70229 Atherosclerosis of native arteries of extremities with rest pain, unspecified extremity: Secondary | ICD-10-CM

## 2021-01-31 HISTORY — PX: LOWER EXTREMITY ANGIOGRAPHY: CATH118251

## 2021-01-31 LAB — GLUCOSE, CAPILLARY
Glucose-Capillary: 236 mg/dL — ABNORMAL HIGH (ref 70–99)
Glucose-Capillary: 139 mg/dL — ABNORMAL HIGH (ref 70–99)

## 2021-01-31 LAB — CREATININE, SERUM
Creatinine, Ser: 1.54 mg/dL — ABNORMAL HIGH (ref 0.61–1.24)
GFR, Estimated: 52 mL/min — ABNORMAL LOW (ref >60)

## 2021-01-31 LAB — BUN: BUN: 35 mg/dL — ABNORMAL HIGH (ref 6–20)

## 2021-01-31 SURGERY — LOWER EXTREMITY ANGIOGRAPHY
Anesthesia: Moderate Sedation | Site: Leg Lower | Laterality: Left

## 2021-01-31 MED ORDER — SODIUM CHLORIDE 0.9 % IV SOLN: INTRAVENOUS | Status: DC

## 2021-01-31 MED ORDER — FENTANYL CITRATE PF 50 MCG/ML IJ SOSY
PREFILLED_SYRINGE | INTRAMUSCULAR | Status: AC
  Filled 2021-01-31: qty 1

## 2021-01-31 MED ORDER — SODIUM CHLORIDE 0.9% FLUSH: 3.0000 mL | INTRAVENOUS | Status: DC | PRN

## 2021-01-31 MED ORDER — MIDAZOLAM HCL 2 MG/2ML IJ SOLN
INTRAMUSCULAR | Status: AC
  Filled 2021-01-31: qty 2

## 2021-01-31 MED ORDER — APIXABAN 2.5 MG PO TABS: 2.5000 mg | ORAL_TABLET | Freq: Two times a day (BID) | ORAL | 1 refills | Status: DC

## 2021-01-31 MED ORDER — ONDANSETRON HCL 4 MG/2ML IJ SOLN: 4.0000 mg | Freq: Four times a day (QID) | INTRAMUSCULAR | Status: DC | PRN

## 2021-01-31 MED ORDER — ASPIRIN EC 81 MG PO TBEC
DELAYED_RELEASE_TABLET | ORAL | Status: AC
  Filled 2021-01-31: qty 1

## 2021-01-31 MED ORDER — IODIXANOL 320 MG/ML IV SOLN
INTRAVENOUS | Status: DC | PRN
  Administered 2021-01-31: 95 mL via INTRA_ARTERIAL

## 2021-01-31 MED ORDER — HYDROMORPHONE HCL 1 MG/ML IJ SOLN: 1.0000 mg | Freq: Once | INTRAMUSCULAR | Status: DC | PRN

## 2021-01-31 MED ORDER — SODIUM CHLORIDE 0.9 % IV SOLN: 250.0000 mL | INTRAVENOUS | Status: DC | PRN

## 2021-01-31 MED ORDER — HEPARIN SODIUM (PORCINE) 1000 UNIT/ML IJ SOLN
INTRAMUSCULAR | Status: DC | PRN
  Administered 2021-01-31: 6000 [IU] via INTRAVENOUS

## 2021-01-31 MED ORDER — HEPARIN SODIUM (PORCINE) 1000 UNIT/ML IJ SOLN
INTRAMUSCULAR | Status: AC
  Filled 2021-01-31: qty 1

## 2021-01-31 MED ORDER — NITROGLYCERIN 1 MG/10 ML FOR IR/CATH LAB
INTRA_ARTERIAL | Status: AC
  Filled 2021-01-31: qty 10

## 2021-01-31 MED ORDER — MIDAZOLAM HCL 2 MG/2ML IJ SOLN
INTRAMUSCULAR | Status: DC | PRN
  Administered 2021-01-31 (×4): 1 mg via INTRAVENOUS
  Administered 2021-01-31: 2 mg via INTRAVENOUS

## 2021-01-31 MED ORDER — FENTANYL CITRATE (PF) 100 MCG/2ML IJ SOLN
INTRAMUSCULAR | Status: DC | PRN
  Administered 2021-01-31 (×4): 25 ug via INTRAVENOUS
  Administered 2021-01-31: 50 ug via INTRAVENOUS

## 2021-01-31 MED ORDER — CEFAZOLIN SODIUM-DEXTROSE 2-4 GM/100ML-% IV SOLN
INTRAVENOUS | Status: AC
  Administered 2021-01-31: 2 g via INTRAVENOUS
  Filled 2021-01-31: qty 100

## 2021-01-31 MED ORDER — ASPIRIN EC 81 MG PO TBEC: 81.0000 mg | DELAYED_RELEASE_TABLET | Freq: Every day | ORAL | Status: DC

## 2021-01-31 MED ORDER — SODIUM CHLORIDE 0.9% FLUSH: 3.0000 mL | Freq: Two times a day (BID) | INTRAVENOUS | Status: DC

## 2021-01-31 MED ORDER — METHYLPREDNISOLONE SODIUM SUCC 125 MG IJ SOLR: 125.0000 mg | Freq: Once | INTRAMUSCULAR | Status: DC | PRN

## 2021-01-31 MED ORDER — ASPIRIN EC 81 MG PO TBEC: 81.0000 mg | DELAYED_RELEASE_TABLET | Freq: Every day | ORAL | 2 refills | Status: DC

## 2021-01-31 MED ORDER — ASPIRIN EC 325 MG PO TBEC
DELAYED_RELEASE_TABLET | ORAL | Status: AC
  Administered 2021-01-31: 81 mg via ORAL
  Filled 2021-01-31: qty 1

## 2021-01-31 MED ORDER — MIDAZOLAM HCL 2 MG/ML PO SYRP: 8.0000 mg | ORAL_SOLUTION | Freq: Once | ORAL | Status: DC | PRN

## 2021-01-31 MED ORDER — ACETAMINOPHEN 325 MG PO TABS: 650.0000 mg | ORAL_TABLET | ORAL | Status: DC | PRN

## 2021-01-31 MED ORDER — FAMOTIDINE 20 MG PO TABS: 40.0000 mg | ORAL_TABLET | Freq: Once | ORAL | Status: DC | PRN

## 2021-01-31 MED ORDER — DIPHENHYDRAMINE HCL 50 MG/ML IJ SOLN: 50.0000 mg | Freq: Once | INTRAMUSCULAR | Status: DC | PRN

## 2021-01-31 MED ORDER — CEFAZOLIN SODIUM-DEXTROSE 2-4 GM/100ML-% IV SOLN: 2.0000 g | Freq: Once | INTRAVENOUS | Status: AC

## 2021-01-31 MED ORDER — NITROGLYCERIN 1 MG/10 ML FOR IR/CATH LAB
INTRA_ARTERIAL | Status: DC | PRN
  Administered 2021-01-31: 300 ug via INTRA_ARTERIAL

## 2021-01-31 MED ORDER — LABETALOL HCL 5 MG/ML IV SOLN: 10.0000 mg | INTRAVENOUS | Status: DC | PRN

## 2021-01-31 MED ORDER — HYDRALAZINE HCL 20 MG/ML IJ SOLN: 5.0000 mg | INTRAMUSCULAR | Status: DC | PRN

## 2021-01-31 SURGICAL SUPPLY — 29 items
BALLN LUTONIX 018 5X220X130 (BALLOONS) ×2
BALLN LUTONIX DCB 4X60X130 (BALLOONS) ×2
BALLN LUTONIX DCB 5X100X130 (BALLOONS) ×2
BALLN ULTRVRSE 3X100X150 (BALLOONS) ×2
BALLN ULTRVRSE 3X300X150 (BALLOONS) ×1
BALLN ULTRVRSE 3X300X150 OTW (BALLOONS) ×1
BALLOON LUTONIX 018 5X220X130 (BALLOONS) IMPLANT
BALLOON LUTONIX DCB 4X60X130 (BALLOONS) IMPLANT
BALLOON LUTONIX DCB 5X100X130 (BALLOONS) IMPLANT
BALLOON ULTRVRSE 3X100X150 (BALLOONS) IMPLANT
BALLOON ULTRVRSE 3X300X150 OTW (BALLOONS) IMPLANT
CATH ANGIO 5F PIGTAIL 65CM (CATHETERS) ×1 IMPLANT
CATH NAVICROSS ANGLED 135CM (MICROCATHETER) ×1 IMPLANT
CATH ROTAREX 135 6FR (CATHETERS) ×1 IMPLANT
CATH VERT 5X100 (CATHETERS) ×1 IMPLANT
COVER PROBE U/S 5X48 (MISCELLANEOUS) ×1 IMPLANT
DEVICE STARCLOSE SE CLOSURE (Vascular Products) ×1 IMPLANT
GLIDEWIRE ADV .035X180CM (WIRE) ×1 IMPLANT
GLIDEWIRE ADV .035X260CM (WIRE) ×1 IMPLANT
GUIDEWIRE PFTE-COATED .018X300 (WIRE) ×1 IMPLANT
KIT ENCORE 26 ADVANTAGE (KITS) ×1 IMPLANT
PACK ANGIOGRAPHY (CUSTOM PROCEDURE TRAY) ×2 IMPLANT
SHEATH ANL2 6FRX45 HC (SHEATH) ×1 IMPLANT
SHEATH BRITE TIP 5FRX11 (SHEATH) ×1 IMPLANT
STENT LIFESTENT 5F 6X120X135 (Permanent Stent) ×1 IMPLANT
SYR MEDRAD MARK 7 150ML (SYRINGE) ×1 IMPLANT
TUBING CONTRAST HIGH PRESS 48 (TUBING) ×2 IMPLANT
WIRE G V18X300CM (WIRE) ×1 IMPLANT
WIRE GUIDERIGHT .035X150 (WIRE) ×1 IMPLANT

## 2021-01-31 NOTE — H&P (View-Only) (Signed)
Subjective:    Patient ID: [name], male    DOB: 1960-10-29, 60 y.o.   MRN: [id number] Chief Complaint  Patient presents with   New Patient (Initial Visit)    NP [place] [facility] abi    [name] is a 60 year old male that presents today for concern for possible peripheral arterial disease.  The patient has type 1 diabetes and has had diabetes since he was 60 years old.  The patient recently began having pain in his left lower extremity with burning.  He also gets calf pain with walking.  Recently his left lower extremity has began to have discoloration.  The symptoms are concerning for the patient's endocrinologist Dr. Honor Junes and feels the may be related to peripheral arterial disease.  Outside ABIs indicate that the patient may have severe peripheral arterial disease.  The patient also has concerning symptoms today for rest pain.  He denies any open wounds or ulcerations.  Today noninvasive studies show a noncompressible ABI on the right with a TBI of 0.32.  Left has an ABI of 0.41 with a TBI of 0.  The right lower extremity arterial duplex shows monophasic waveforms beginning in the right mid SFA with monophasic waveforms throughout the distal posterior tibial waveform is absent with blunted peroneal waveforms.  The left lower extremity has triphasic/biphasic waveforms to the level of the distal popliteal where it becomes absent at the distal anterior tibial with reduced posterior tibial waveforms.  The left toe waveform is flat with dampened right toe waveforms.   Review of Systems  Cardiovascular:        Rest pain  Skin:  Positive for color change.  All other systems reviewed and are negative.     Objective:   Physical Exam Vitals reviewed.  HENT:     Head: Normocephalic.  Cardiovascular:     Rate and Rhythm: Normal rate.     Pulses:          Dorsalis pedis pulses are detected w/ Doppler on the right side and 0 on the left side.       Posterior tibial pulses are 0 on the  left side.  Pulmonary:     Effort: Pulmonary effort is normal.  Skin:    Coloration: Skin is pale.  Neurological:     Mental Status: He is alert and oriented to person, place, and time.  Psychiatric:        Mood and Affect: Mood normal.        Behavior: Behavior normal.        Thought Content: Thought content normal.        Judgment: Judgment normal.    BP (!) 142/85   Pulse 89   Ht 6' (1.829 m)   Wt 281 lb (127.5 kg)   BMI 38.11 kg/m   Past Medical History:  Diagnosis Date   Depression    Diabetes ([place])    Insulin Pump   Diabetes mellitus type I ([place])    Diabetes mellitus without complication (HCC)    GERD (gastroesophageal reflux disease)    H/O laryngectomy    Heel bone fracture    Hyperlipidemia    Hypertension    Radicular pain of right lower extremity    Stroke ([place])    Suicide attempt ([place]) 2014   damaged larynx - tracheostomy   Thyroid disease     Social History   Socioeconomic History   Marital status: Married    Spouse name: Not on file  Number of children: Not on file   Years of education: Not on file   Highest education level: Not on file  Occupational History   Not on file  Tobacco Use   Smoking status: Former    Packs/day: 0.00    Types: Cigarettes    Quit date: 04/09/2013    Years since quitting: 7.8   Smokeless tobacco: Never  Vaping Use   Vaping Use: Never used  Substance and Sexual Activity   Alcohol use: Yes    Alcohol/week: 2.0 standard drinks    Types: 2 Shots of liquor per week    Comment: rare   Drug use: No    Comment: Pt denied; UDS not available   Sexual activity: Yes    Partners: Female    Birth control/protection: Condom  Other Topics Concern   Not on file  Social History Narrative   Not on file   Social Determinants of Health   Financial Resource Strain: Not on file  Food Insecurity: Not on file  Transportation Needs: Not on file  Physical Activity: Not on file  Stress: Not on file  Social Connections: Not  on file  Intimate Partner Violence: Not on file    Past Surgical History:  Procedure Laterality Date   COLONOSCOPY WITH PROPOFOL N/A 05/15/2018   Procedure: COLONOSCOPY WITH PROPOFOL;  Surgeon: [name], [name], MD;  Location: [facility] ENDOSCOPY;  Service: Gastroenterology;  Laterality: N/A;   ESOPHAGOGASTRODUODENOSCOPY N/A 05/15/2018   Procedure: ESOPHAGOGASTRODUODENOSCOPY (EGD);  Surgeon: [name], [name], MD;  Location: [facility] ENDOSCOPY;  Service: Gastroenterology;  Laterality: N/A;   FRACTURE SURGERY     Heel bone reconstruction Left    HERNIA REPAIR  [id number]   Umbilical hernia repair    LARYNGECTOMY     NECK SURGERY     fusion   SPINE SURGERY     TRACHEOSTOMY  2014   from SI attempt    Family History  Problem Relation Age of Onset   Osteoporosis Mother    Diabetes Mother    Hypertension Father    Mental illness Neg Hx     Allergies  Allergen Reactions   Buspar [Buspirone]     Makes the patient "flip out"   Depakote [Valproic Acid]     Causes excessive drowsiness   Clopidogrel Rash   Gabapentin Itching and Rash    CBC Latest Ref Rng & Units 01/21/2019 12/31/2018 12/23/2018  WBC 4.0 - 10.5 K/uL 9.0 9.7 8.3  Hemoglobin 13.0 - 17.0 g/dL 15.9 16.3 16.2  Hematocrit 39.0 - 52.0 % 47.3 48.4 48.4  Platelets 150 - 400 K/uL 291 303 293      CMP     Component Value Date/Time   NA 135 01/22/2019 1431   NA 140 02/20/2014 0806   K 3.9 01/22/2019 1431   K 4.5 02/20/2014 0806   CL 100 01/22/2019 1431   CL 107 02/20/2014 0806   CO2 20 (L) 01/22/2019 1431   CO2 28 02/20/2014 0806   GLUCOSE 510 (HH) 01/22/2019 1431   GLUCOSE 201 (H) 02/20/2014 0806   BUN 28 (H) 01/22/2019 1431   BUN 18 02/20/2014 0806   CREATININE 1.80 (H) 01/22/2019 1431   CREATININE 1.26 02/20/2014 0806   CALCIUM 8.8 (L) 01/22/2019 1431   CALCIUM 8.7 02/20/2014 0806   PROT 6.9 01/21/2019 1457   PROT 6.9 02/17/2014 1106   ALBUMIN 3.7 01/21/2019 1457   ALBUMIN 3.4 02/17/2014 1106   AST 24 01/21/2019 1457    AST 20 02/17/2014  1106   ALT 16 01/21/2019 1457   ALT 25 02/17/2014 1106   ALKPHOS 116 01/21/2019 1457   ALKPHOS 115 02/17/2014 1106   BILITOT 0.8 01/21/2019 1457   BILITOT 0.3 02/17/2014 1106   GFRNONAA 41 (L) 01/22/2019 1431   GFRNONAA >60 02/20/2014 0806   GFRNONAA >60 07/14/2013 0959   GFRAA 48 (L) 01/22/2019 1431   GFRAA >60 02/20/2014 0806   GFRAA >60 07/14/2013 0959     VAS [place] ABI WITH/WO TBI  Result Date: 01/25/2021  LOWER EXTREMITY DOPPLER STUDY Patient Name:  Dierks Wach  Date of Exam:   01/25/2021 Medical Rec #: [id number]           Accession #:    [id number] Date of Birth: 1960-11-27           Patient Gender: M Patient Age:   28 years Exam Location:  [facility] Procedure:      VAS [place] ABI WITH/WO TBI Referring Phys: Eulogio Ditch --------------------------------------------------------------------------------  Indications: Peripheral artery disease.  Performing Technologist: Almira Coaster RVS  Examination Guidelines: A complete evaluation includes at minimum, Doppler waveform signals and systolic blood pressure reading at the level of bilateral brachial, anterior tibial, and posterior tibial arteries, when vessel segments are accessible. Bilateral testing is considered an integral part of a complete examination. Photoelectric Plethysmograph (PPG) waveforms and toe systolic pressure readings are included as required and additional duplex testing as needed. Limited examinations for reoccurring indications may be performed as noted.  ABI Findings: +---------+------------------+-----+----------+--------+ Right    Rt Pressure (mmHg)IndexWaveform  Comment  +---------+------------------+-----+----------+--------+ Brachial 179                                       +---------+------------------+-----+----------+--------+ ATA      250                    monophasicNC       +---------+------------------+-----+----------+--------+ PTA      113                0.63 monophasic         +---------+------------------+-----+----------+--------+ Great Toe57                0.32 Abnormal           +---------+------------------+-----+----------+--------+ +---------+------------------+-----+----------+-------+ Left     Lt Pressure (mmHg)IndexWaveform  Comment +---------+------------------+-----+----------+-------+ Brachial 177                                      +---------+------------------+-----+----------+-------+ ATA      73                     monophasic.41     +---------+------------------+-----+----------+-------+ PTA      65                0.36 monophasic        +---------+------------------+-----+----------+-------+ Great Toe0                 0.00 Absent            +---------+------------------+-----+----------+-------+ +-------+-----------+-----------+------------+------------+ ABI/TBIToday's ABIToday's TBIPrevious ABIPrevious TBI +-------+-----------+-----------+------------+------------+ Right  >1.0 [place]    .32                                 +-------+-----------+-----------+------------+------------+  Left   .41        0.0                                 +-------+-----------+-----------+------------+------------+  Summary: Right: Resting right ankle-brachial index indicates noncompressible right lower extremity arteries. The right toe-brachial index is abnormal. Left: Resting left ankle-brachial index indicates severe left lower extremity arterial disease. The left toe-brachial index is abnormal.  *See table(s) above for measurements and observations.  Electronically signed by [name] MD on 01/25/2021 at 4:41:49 PM.    Final        Assessment & Plan:   1. 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  This represents a limb threatening ischemia and places the patient at the risk for limb loss.  Patient should undergo angiography of the lower extremities with the hope for intervention for limb salvage.  The risks and benefits as well as the alternative therapies was discussed in detail with the patient.  All questions were answered.  Patient agrees to proceed with angiography.  The patient will follow up with me in the office after the procedure.      - oxyCODONE-acetaminophen (PERCOCET) 10-325 MG tablet; Take 1 tablet by mouth every 4 (four) hours as needed for pain.  Dispense: 30 tablet; Refill: 0  2. Hyperlipidemia, unspecified hyperlipidemia type Continue statin as ordered and reviewed, no changes at this time   3. Primary hypertension Continue antihypertensive medications as already ordered, these medications have been reviewed and there are no changes at this time.    Current Outpatient Medications on File Prior to Visit  Medication Sig Dispense Refill   amLODipine (NORVASC) 5 MG tablet Take 1 tablet (5 mg total) by mouth daily. 30 tablet 1   amLODipine (NORVASC) 5 MG tablet Take 1 tablet by mouth daily.     buPROPion (WELLBUTRIN XL) 300 MG 24 hr tablet Take 1 tablet (300 mg total) by mouth daily. 90 tablet 1   chlorhexidine (PERIDEX) 0.12 % solution 2 (two) times daily.     empagliflozin (JARDIANCE) 25 MG TABS tablet Take 25 mg by mouth daily. 30 tablet 1   FLUCELVAX QUADRIVALENT 0.5 ML injection      insulin aspart (NOVOLOG) 100 UNIT/ML injection INJECT UP TO 120 UNITS UNDER THE SKIN AS DIRECTED DAILY VIA INSULIN PUMP     levothyroxine (SYNTHROID) 75 MCG tablet TAKE 1 TABLET BY MOUTH DAILY AT 6 AM 90 tablet 0   losartan-hydrochlorothiazide (HYZAAR) 100-25 MG tablet TAKE 1 TABLET BY MOUTH EVERY DAY     OLANZapine (ZYPREXA) 15 MG tablet TAKE 1 TABLET(15 MG) BY MOUTH AT BEDTIME 30 tablet 1   pantoprazole (PROTONIX) 40 MG tablet TAKE 1 TABLET(40 MG) BY MOUTH TWICE DAILY      rosuvastatin (CRESTOR) 20 MG tablet Take 20 mg by mouth at bedtime.     venlafaxine XR (EFFEXOR-XR) 150 MG 24 hr capsule TAKE 1 CAPSULE(150 MG) BY MOUTH DAILY WITH BREAKFAST 30 capsule 1   zolpidem (AMBIEN) 10 MG tablet Take 1 tablet (10 mg total) by mouth at bedtime as needed for sleep. 30 tablet 1   glycopyrrolate (ROBINUL) 1 MG tablet Take by mouth.     No current facility-administered medications on file prior to  visit.    There are no Patient Instructions on file for this visit. No follow-ups on file.   Kris Hartmann, NP

## 2021-01-31 NOTE — Interval H&P Note (Signed)
History and Physical Interval Note:  01/31/2021 9:22 AM  [name]  has presented today for surgery, with the diagnosis of LLE Angiography   ASO with rest pain   BARD Rep  cc: [name].  The various methods of treatment have been discussed with the patient and family. After consideration of risks, benefits and other options for treatment, the patient has consented to  Procedure(s): LOWER EXTREMITY ANGIOGRAPHY (Left) as a surgical intervention.  The patient's history has been reviewed, patient examined, no change in status, stable for surgery.  I have reviewed the patient's chart and labs.  Questions were answered to the patient's satisfaction.     [name]

## 2021-01-31 NOTE — Op Note (Signed)
[facility]  Percutaneous Study/Intervention Procedural Note   Date of Surgery: 01/31/2021  Surgeon(s):Halsey Persaud    Assistants:none  Pre-operative Diagnosis: PAD with rest pain BLE  Post-operative diagnosis:  Same  Procedure(s) Performed:             1.  Ultrasound guidance for vascular access right femoral artery             2.  Catheter placement into left common femoral artery from right femoral approach             3.  Aortogram and selective left lower extremity angiogram             4.  Percutaneous transluminal angioplasty of left anterior tibial artery with 3 mm diameter by 10 cm length angioplasty balloon             5.  Mechanical thrombectomy with the [place] Rex device to the left popliteal artery  6.  Percutaneous transluminal angioplasty of the left popliteal artery with 5 mm diameter by 22 cm length Lutonix drug-coated angioplasty balloon  7.  Percutaneous transluminal angioplasty of the left tibioperoneal trunk and peroneal artery with 3 mm diameter by 30 cm length angioplasty balloon  8.  Stent placement to the left popliteal artery with 6 mm diameter by 12 cm length life stent             9.  StarClose closure device right femoral artery  EBL: 10 cc  Contrast: 95 cc  Fluoro Time: 13.6 minutes  Moderate Conscious Sedation Time: approximately 85 minutes using 6 mg of Versed and 125 mcg of Fentanyl              Indications:  Patient is a 60 y.o.male with rest pain and severe PAD. The patient has noninvasive study showing non-compressible vessels on the right and markedly reduced ABI and flat digital tracings on the left. The patient is brought in for angiography for further evaluation and potential treatment.  Due to the limb threatening nature of the situation, angiogram was performed for attempted limb salvage. The patient is aware that if the procedure fails, amputation would be expected.  The patient also understands that even with successful  revascularization, amputation may still be required due to the severity of the situation.  Risks and benefits are discussed and informed consent is obtained.   Procedure:  The patient was identified and appropriate procedural time out was performed.  The patient was then placed supine on the table and prepped and draped in the usual sterile fashion. Moderate conscious sedation was administered during a face to face encounter with the patient throughout the procedure with my supervision of the RN administering medicines and monitoring the patient's vital signs, pulse oximetry, telemetry and mental status throughout from the start of the procedure until the patient was taken to the recovery room. Ultrasound was used to evaluate the right common femoral artery.  It was patent .  A digital ultrasound image was acquired.  A Seldinger needle was used to access the right common femoral artery under direct ultrasound guidance and a permanent image was performed.  A 0.035 J wire was advanced without resistance and a 5Fr sheath was placed.  Pigtail catheter was placed into the aorta and an AP aortogram was performed. This demonstrated normal renal arteries.  The aorta appeared normal.  The iliac arteries had no hemodynamically significant stenosis although there was mild disease in the left external iliac artery in the  30 to 40% range. I then crossed the aortic bifurcation and advanced to the left femoral head. Selective left lower extremity angiogram was then performed. This demonstrated fairly normal common femoral artery, profunda femoris artery, superficial femoral artery with an abrupt occlusion of the popliteal artery just above the knee.  There was severe tibial disease.  All 3 tibial vessels had areas of occlusion.  The posterior tibial artery was not seen and appear to be chronically occluded.  The tibioperoneal trunk and peroneal artery were occluded to the mid segment.  The anterior tibial artery had a proximal  occlusion and then a short segment that was patent and then reoccluded in the mid to distal segment. It was felt that it was in the patient's best interest to proceed with intervention after these images to avoid a second procedure and a larger amount of contrast and fluoroscopy based off of the findings from the initial angiogram. The patient was systemically heparinized and a 6 [place] Ansell sheath was then placed over the [employer] wire. I then used a Kumpe catheter and the advantage wire to cross the occlusion in the popliteal artery and initially get down into the anterior tibial artery with minimal difficulty.  Intraluminal flow was confirmed, but the anterior tibial artery occluded in the mid to distal segment and was not continuous to the foot.  I performed mechanical thrombectomy to debulk the thrombus as this appeared to be a fairly thrombotic process in the popliteal artery with the [place] Rex device.  Several passes were made with removal of some chronic thrombus, but there remained high-grade residual stenosis in the popliteal artery and poor flow distally.  I then proceeded with angioplasty.  A 3 mm diameter by 10 cm length angioplasty balloon was inflated in the proximal anterior tibial artery up to 12 atm for 1 minute.  The popliteal artery was then treated with a 5 mm diameter by 22 cm length Lutonix drug-coated angioplasty balloon inflated to 8 atm for 1 minute.  Completion imaging showed 2 areas of residual stenosis greater than 50% in the popliteal artery.  The anterior tibial artery was improved proximally, but remained occluded distally so I turned my attention to trying to get the peroneal artery open which was actually the best runoff distally.  Tediously, using the Ferd Hibbs cross catheter and then a 0.018 advantage wire was able to get across the occlusion in the tibioperoneal trunk and peroneal artery and get the 0.018 advantage wire all the way down to the ankle.  I then performed  angioplasty with a 3 mm diameter by 30 cm length angioplasty balloon from the mid to distal peroneal artery up through the tibioperoneal trunk.  Significant waist was seen which resolved at about 10 atm.  I elected to stent the popliteal artery but due to the large number of collaterals as used a noncovered stent.  A 6 mm diameter by 12 cm length life stent was deployed in the popliteal artery and was postdilated to 4 mm with Lutonix drug-coated balloon distally and 5 mm with Lutonix drug-coated balloon proximally.  Completion imaging showed less than 10% residual stenosis in the popliteal artery but there remained narrowing in the proximal peroneal artery.  Was difficult to discern if this was spasm versus residual disease so we gave intra-arterial nitroglycerin but ultimately retreated the peroneal artery and tibioperoneal trunk with an inflation up to 12 atm with a 3 mm diameter angioplasty balloon for 1 minute.  Completion imaging showed improved flow with  only about a 25 to 30% residual stenosis in the peroneal artery that did not appear flow-limiting. I elected to terminate the procedure. The sheath was removed and StarClose closure device was deployed in the right femoral artery with excellent hemostatic result. The patient was taken to the recovery room in stable condition having tolerated the procedure well.  Findings:               Aortogram: Renal arteries appeared normal.  The aorta appeared normal.  The iliac arteries had no hemodynamically significant stenosis although there was mild disease in the left external iliac artery in the 30 to 40% range.             Left Lower Extremity:  This demonstrated fairly normal common femoral artery, profunda femoris artery, superficial femoral artery with an abrupt occlusion of the popliteal artery just above the knee.  There was severe tibial disease.  All 3 tibial vessels had areas of occlusion.  The posterior tibial artery was not seen and appear to be  chronically occluded.  The tibioperoneal trunk and peroneal artery were occluded to the mid segment.  The anterior tibial artery had a proximal occlusion and then a short segment that was patent and then reoccluded in the mid to distal segment   Disposition: Patient was taken to the recovery room in stable condition having tolerated the procedure well.  Complications: None  [name] 01/31/2021 12:09 PM   This note was created with Dragon Medical transcription system. Any errors in dictation are purely unintentional.

## 2021-01-31 NOTE — H&P (View-Only) (Signed)
Subjective:    Patient ID: [name], male    DOB: March 12, 1961, 60 y.o.   MRN: [id number] Chief Complaint  Patient presents with   New Patient (Initial Visit)    NP Ladell Pier PVD abi    [name] is a 60 year old male that presents today for concern for possible peripheral arterial disease.  The patient has type 1 diabetes and has had diabetes since he was 60 years old.  The patient recently began having pain in his left lower extremity with burning.  He also gets calf pain with walking.  Recently his left lower extremity has began to have discoloration.  The symptoms are concerning for the patient's endocrinologist Dr. Honor Junes and feels the may be related to peripheral arterial disease.  Outside ABIs indicate that the patient may have severe peripheral arterial disease.  The patient also has concerning symptoms today for rest pain.  He denies any open wounds or ulcerations.  Today noninvasive studies show a noncompressible ABI on the right with a TBI of 0.32.  Left has an ABI of 0.41 with a TBI of 0.  The right lower extremity arterial duplex shows monophasic waveforms beginning in the right mid SFA with monophasic waveforms throughout the distal posterior tibial waveform is absent with blunted peroneal waveforms.  The left lower extremity has triphasic/biphasic waveforms to the level of the distal popliteal where it becomes absent at the distal anterior tibial with reduced posterior tibial waveforms.  The left toe waveform is flat with dampened right toe waveforms.   Review of Systems  Cardiovascular:        Rest pain  Skin:  Positive for color change.  All other systems reviewed and are negative.     Objective:   Physical Exam Vitals reviewed.  HENT:     Head: Normocephalic.  Cardiovascular:     Rate and Rhythm: Normal rate.     Pulses:          Dorsalis pedis pulses are detected w/ Doppler on the right side and 0 on the left side.       Posterior tibial pulses are 0 on the  left side.  Pulmonary:     Effort: Pulmonary effort is normal.  Skin:    Coloration: Skin is pale.  Neurological:     Mental Status: He is alert and oriented to person, place, and time.  Psychiatric:        Mood and Affect: Mood normal.        Behavior: Behavior normal.        Thought Content: Thought content normal.        Judgment: Judgment normal.    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   Thyroid disease     Social History   Socioeconomic History   Marital status: Married    Spouse name: Not on file  Number of children: Not on file   Years of education: Not on file   Highest education level: Not on file  Occupational History   Not on file  Tobacco Use   Smoking status: Former    Packs/day: 0.00    Types: Cigarettes    Quit date: 04/09/2013    Years since quitting: 7.8   Smokeless tobacco: Never  Vaping Use   Vaping Use: Never used  Substance and Sexual Activity   Alcohol use: Yes    Alcohol/week: 2.0 standard drinks    Types: 2 Shots of liquor per week    Comment: rare   Drug use: No    Comment: Pt denied; UDS not available   Sexual activity: Yes    Partners: Female    Birth control/protection: Condom  Other Topics Concern   Not on file  Social History Narrative   Not on file   Social Determinants of Health   Financial Resource Strain: Not on file  Food Insecurity: Not on file  Transportation Needs: Not on file  Physical Activity: Not on file  Stress: Not on file  Social Connections: Not  on file  Intimate Partner Violence: Not on file    Past Surgical History:  Procedure Laterality Date   COLONOSCOPY WITH PROPOFOL N/A 05/15/2018   Procedure: COLONOSCOPY WITH PROPOFOL;  Surgeon: [name], [name], MD;  Location: [facility] ENDOSCOPY;  Service: Gastroenterology;  Laterality: N/A;   ESOPHAGOGASTRODUODENOSCOPY N/A 05/15/2018   Procedure: ESOPHAGOGASTRODUODENOSCOPY (EGD);  Surgeon: [name], [name], MD;  Location: [facility] ENDOSCOPY;  Service: Gastroenterology;  Laterality: N/A;   FRACTURE SURGERY     Heel bone reconstruction Left    HERNIA REPAIR  [id number]/[id number]   Umbilical hernia repair    LARYNGECTOMY     NECK SURGERY     fusion   SPINE SURGERY     TRACHEOSTOMY  2014   from SI attempt    Family History  Problem Relation Age of Onset   Osteoporosis Mother    Diabetes Mother    Hypertension Father    Mental illness Neg Hx     Allergies  Allergen Reactions   Buspar [Buspirone]     Makes the patient "flip out"   Depakote [Valproic Acid]     Causes excessive drowsiness   Clopidogrel Rash   Gabapentin Itching and Rash    CBC Latest Ref Rng & Units 01/21/2019 12/31/2018 12/23/2018  WBC 4.0 - 10.5 K/uL 9.0 9.7 8.3  Hemoglobin 13.0 - 17.0 g/dL 15.9 16.3 16.2  Hematocrit 39.0 - 52.0 % 47.3 48.4 48.4  Platelets 150 - 400 K/uL 291 303 293      CMP     Component Value Date/Time   NA 135 01/22/2019 1431   NA 140 02/20/2014 0806   K 3.9 01/22/2019 1431   K 4.5 02/20/2014 0806   CL 100 01/22/2019 1431   CL 107 02/20/2014 0806   CO2 20 (L) 01/22/2019 1431   CO2 28 02/20/2014 0806   GLUCOSE 510 (HH) 01/22/2019 1431   GLUCOSE 201 (H) 02/20/2014 0806   BUN 28 (H) 01/22/2019 1431   BUN 18 02/20/2014 0806   CREATININE 1.80 (H) 01/22/2019 1431   CREATININE 1.26 02/20/2014 0806   CALCIUM 8.8 (L) 01/22/2019 1431   CALCIUM 8.7 02/20/2014 0806   PROT 6.9 01/21/2019 1457   PROT 6.9 02/17/2014 1106   ALBUMIN 3.7 01/21/2019 1457   ALBUMIN 3.4 02/17/2014 1106   AST 24 01/21/2019 1457    AST 20 02/17/2014  1106   ALT 16 01/21/2019 1457   ALT 25 02/17/2014 1106   ALKPHOS 116 01/21/2019 1457   ALKPHOS 115 02/17/2014 1106   BILITOT 0.8 01/21/2019 1457   BILITOT 0.3 02/17/2014 1106   GFRNONAA 41 (L) 01/22/2019 1431   GFRNONAA >60 02/20/2014 0806   GFRNONAA >60 07/14/2013 0959   GFRAA 48 (L) 01/22/2019 1431   GFRAA >60 02/20/2014 0806   GFRAA >60 07/14/2013 0959     VAS [place] ABI WITH/WO TBI  Result Date: 01/25/2021  LOWER EXTREMITY DOPPLER STUDY Patient Name:  [name]  Date of Exam:   01/25/2021 Medical Rec #: [id number]           Accession #:    [id number] Date of Birth: Nov 01, 1960           Patient Gender: M Patient Age:   66 years Exam Location:  [facility] Procedure:      VAS [place] ABI WITH/WO TBI Referring Phys: Eulogio Ditch --------------------------------------------------------------------------------  Indications: Peripheral artery disease.  Performing Technologist: Almira Coaster RVS  Examination Guidelines: A complete evaluation includes at minimum, Doppler waveform signals and systolic blood pressure reading at the level of bilateral brachial, anterior tibial, and posterior tibial arteries, when vessel segments are accessible. Bilateral testing is considered an integral part of a complete examination. Photoelectric Plethysmograph (PPG) waveforms and toe systolic pressure readings are included as required and additional duplex testing as needed. Limited examinations for reoccurring indications may be performed as noted.  ABI Findings: +---------+------------------+-----+----------+--------+ Right    Rt Pressure (mmHg)IndexWaveform  Comment  +---------+------------------+-----+----------+--------+ Brachial 179                                       +---------+------------------+-----+----------+--------+ ATA      250                    monophasicNC       +---------+------------------+-----+----------+--------+ PTA      113                0.63 monophasic         +---------+------------------+-----+----------+--------+ Great Toe57                0.32 Abnormal           +---------+------------------+-----+----------+--------+ +---------+------------------+-----+----------+-------+ Left     Lt Pressure (mmHg)IndexWaveform  Comment +---------+------------------+-----+----------+-------+ Brachial 177                                      +---------+------------------+-----+----------+-------+ ATA      73                     monophasic.41     +---------+------------------+-----+----------+-------+ PTA      65                0.36 monophasic        +---------+------------------+-----+----------+-------+ Great Toe0                 0.00 Absent            +---------+------------------+-----+----------+-------+ +-------+-----------+-----------+------------+------------+ ABI/TBIToday's ABIToday's TBIPrevious ABIPrevious TBI +-------+-----------+-----------+------------+------------+ Right  >1.0 [place]    .32                                 +-------+-----------+-----------+------------+------------+  Left   .41        0.0                                 +-------+-----------+-----------+------------+------------+  Summary: Right: Resting right ankle-brachial index indicates noncompressible right lower extremity arteries. The right toe-brachial index is abnormal. Left: Resting left ankle-brachial index indicates severe left lower extremity arterial disease. The left toe-brachial index is abnormal.  *See table(s) above for measurements and observations.  Electronically signed by [name] MD on 01/25/2021 at 4:41:49 PM.    Final        Assessment & Plan:   1. Rest pain of both lower extremities due to atherosclerosis [facility]) Recommend:  The patient has evidence of severe atherosclerotic changes of both lower extremities with rest pain that is associated with preulcerative changes and impending tissue loss of  the foot.  This represents a limb threatening ischemia and places the patient at the risk for limb loss.  Patient should undergo angiography of the lower extremities with the hope for intervention for limb salvage.  The risks and benefits as well as the alternative therapies was discussed in detail with the patient.  All questions were answered.  Patient agrees to proceed with angiography.  The patient will follow up with me in the office after the procedure.      - oxyCODONE-acetaminophen (PERCOCET) 10-325 MG tablet; Take 1 tablet by mouth every 4 (four) hours as needed for pain.  Dispense: 30 tablet; Refill: 0  2. Hyperlipidemia, unspecified hyperlipidemia type Continue statin as ordered and reviewed, no changes at this time   3. Primary hypertension Continue antihypertensive medications as already ordered, these medications have been reviewed and there are no changes at this time.    Current Outpatient Medications on File Prior to Visit  Medication Sig Dispense Refill   amLODipine (NORVASC) 5 MG tablet Take 1 tablet (5 mg total) by mouth daily. 30 tablet 1   amLODipine (NORVASC) 5 MG tablet Take 1 tablet by mouth daily.     buPROPion (WELLBUTRIN XL) 300 MG 24 hr tablet Take 1 tablet (300 mg total) by mouth daily. 90 tablet 1   chlorhexidine (PERIDEX) 0.12 % solution 2 (two) times daily.     empagliflozin (JARDIANCE) 25 MG TABS tablet Take 25 mg by mouth daily. 30 tablet 1   FLUCELVAX QUADRIVALENT 0.5 ML injection      insulin aspart (NOVOLOG) 100 UNIT/ML injection INJECT UP TO 120 UNITS UNDER THE SKIN AS DIRECTED DAILY VIA INSULIN PUMP     levothyroxine (SYNTHROID) 75 MCG tablet TAKE 1 TABLET BY MOUTH DAILY AT 6 AM 90 tablet 0   losartan-hydrochlorothiazide (HYZAAR) 100-25 MG tablet TAKE 1 TABLET BY MOUTH EVERY DAY     OLANZapine (ZYPREXA) 15 MG tablet TAKE 1 TABLET(15 MG) BY MOUTH AT BEDTIME 30 tablet 1   pantoprazole (PROTONIX) 40 MG tablet TAKE 1 TABLET(40 MG) BY MOUTH TWICE DAILY      rosuvastatin (CRESTOR) 20 MG tablet Take 20 mg by mouth at bedtime.     venlafaxine XR (EFFEXOR-XR) 150 MG 24 hr capsule TAKE 1 CAPSULE(150 MG) BY MOUTH DAILY WITH BREAKFAST 30 capsule 1   zolpidem (AMBIEN) 10 MG tablet Take 1 tablet (10 mg total) by mouth at bedtime as needed for sleep. 30 tablet 1   glycopyrrolate (ROBINUL) 1 MG tablet Take by mouth.     No current facility-administered medications on file prior to  visit.    There are no Patient Instructions on file for this visit. No follow-ups on file.   Kris Hartmann, NP

## 2021-01-31 NOTE — Progress Notes (Signed)
Subjective:    Patient ID: [name], male    DOB: Dec 10, 1960, 60 y.o.   MRN: [id number] Chief Complaint  Patient presents with   New Patient (Initial Visit)    NP [place] [facility] abi    [name] is a 60 year old male that presents today for concern for possible peripheral arterial disease.  The patient has type 1 diabetes and has had diabetes since he was 60 years old.  The patient recently began having pain in his left lower extremity with burning.  He also gets calf pain with walking.  Recently his left lower extremity has began to have discoloration.  The symptoms are concerning for the patient's endocrinologist Dr. Honor Junes and feels the may be related to peripheral arterial disease.  Outside ABIs indicate that the patient may have severe peripheral arterial disease.  The patient also has concerning symptoms today for rest pain.  He denies any open wounds or ulcerations.  Today noninvasive studies show a noncompressible ABI on the right with a TBI of 0.32.  Left has an ABI of 0.41 with a TBI of 0.  The right lower extremity arterial duplex shows monophasic waveforms beginning in the right mid SFA with monophasic waveforms throughout the distal posterior tibial waveform is absent with blunted peroneal waveforms.  The left lower extremity has triphasic/biphasic waveforms to the level of the distal popliteal where it becomes absent at the distal anterior tibial with reduced posterior tibial waveforms.  The left toe waveform is flat with dampened right toe waveforms.   Review of Systems  Cardiovascular:        Rest pain  Skin:  Positive for color change.  All other systems reviewed and are negative.     Objective:   Physical Exam Vitals reviewed.  HENT:     Head: Normocephalic.  Cardiovascular:     Rate and Rhythm: Normal rate.     Pulses:          Dorsalis pedis pulses are detected w/ Doppler on the right side and 0 on the left side.       Posterior tibial pulses are 0 on the  left side.  Pulmonary:     Effort: Pulmonary effort is normal.  Skin:    Coloration: Skin is pale.  Neurological:     Mental Status: He is alert and oriented to person, place, and time.  Psychiatric:        Mood and Affect: Mood normal.        Behavior: Behavior normal.        Thought Content: Thought content normal.        Judgment: Judgment normal.    BP (!) 142/85   Pulse 89   Ht 6' (1.829 m)   Wt 281 lb (127.5 kg)   BMI 38.11 kg/m   Past Medical History:  Diagnosis Date   Depression    Diabetes ([place])    Insulin Pump   Diabetes mellitus type I ([place])    Diabetes mellitus without complication (HCC)    GERD (gastroesophageal reflux disease)    H/O laryngectomy    Heel bone fracture    Hyperlipidemia    Hypertension    Radicular pain of right lower extremity    Stroke ([place])    Suicide attempt ([place]) 2014   damaged larynx - tracheostomy   Thyroid disease     Social History   Socioeconomic History   Marital status: Married    Spouse name: Not on file  Number of children: Not on file   Years of education: Not on file   Highest education level: Not on file  Occupational History   Not on file  Tobacco Use   Smoking status: Former    Packs/day: 0.00    Types: Cigarettes    Quit date: 04/09/2013    Years since quitting: 7.8   Smokeless tobacco: Never  Vaping Use   Vaping Use: Never used  Substance and Sexual Activity   Alcohol use: Yes    Alcohol/week: 2.0 standard drinks    Types: 2 Shots of liquor per week    Comment: rare   Drug use: No    Comment: Pt denied; UDS not available   Sexual activity: Yes    Partners: Female    Birth control/protection: Condom  Other Topics Concern   Not on file  Social History Narrative   Not on file   Social Determinants of Health   Financial Resource Strain: Not on file  Food Insecurity: Not on file  Transportation Needs: Not on file  Physical Activity: Not on file  Stress: Not on file  Social Connections: Not  on file  Intimate Partner Violence: Not on file    Past Surgical History:  Procedure Laterality Date   COLONOSCOPY WITH PROPOFOL N/A 05/15/2018   Procedure: COLONOSCOPY WITH PROPOFOL;  Surgeon: [name], [name], MD;  Location: [facility] ENDOSCOPY;  Service: Gastroenterology;  Laterality: N/A;   ESOPHAGOGASTRODUODENOSCOPY N/A 05/15/2018   Procedure: ESOPHAGOGASTRODUODENOSCOPY (EGD);  Surgeon: [name], [name], MD;  Location: [facility] ENDOSCOPY;  Service: Gastroenterology;  Laterality: N/A;   FRACTURE SURGERY     Heel bone reconstruction Left    HERNIA REPAIR  61/6073   Umbilical hernia repair    LARYNGECTOMY     NECK SURGERY     fusion   SPINE SURGERY     TRACHEOSTOMY  2014   from SI attempt    Family History  Problem Relation Age of Onset   Osteoporosis Mother    Diabetes Mother    Hypertension Father    Mental illness Neg Hx     Allergies  Allergen Reactions   Buspar [Buspirone]     Makes the patient "flip out"   Depakote [Valproic Acid]     Causes excessive drowsiness   Clopidogrel Rash   Gabapentin Itching and Rash    CBC Latest Ref Rng & Units 01/21/2019 12/31/2018 12/23/2018  WBC 4.0 - 10.5 K/uL 9.0 9.7 8.3  Hemoglobin 13.0 - 17.0 g/dL 15.9 16.3 16.2  Hematocrit 39.0 - 52.0 % 47.3 48.4 48.4  Platelets 150 - 400 K/uL 291 303 293      CMP     Component Value Date/Time   NA 135 01/22/2019 1431   NA 140 02/20/2014 0806   K 3.9 01/22/2019 1431   K 4.5 02/20/2014 0806   CL 100 01/22/2019 1431   CL 107 02/20/2014 0806   CO2 20 (L) 01/22/2019 1431   CO2 28 02/20/2014 0806   GLUCOSE 510 (HH) 01/22/2019 1431   GLUCOSE 201 (H) 02/20/2014 0806   BUN 28 (H) 01/22/2019 1431   BUN 18 02/20/2014 0806   CREATININE 1.80 (H) 01/22/2019 1431   CREATININE 1.26 02/20/2014 0806   CALCIUM 8.8 (L) 01/22/2019 1431   CALCIUM 8.7 02/20/2014 0806   PROT 6.9 01/21/2019 1457   PROT 6.9 02/17/2014 1106   ALBUMIN 3.7 01/21/2019 1457   ALBUMIN 3.4 02/17/2014 1106   AST 24 01/21/2019 1457    AST 20 02/17/2014  1106   ALT 16 01/21/2019 1457   ALT 25 02/17/2014 1106   ALKPHOS 116 01/21/2019 1457   ALKPHOS 115 02/17/2014 1106   BILITOT 0.8 01/21/2019 1457   BILITOT 0.3 02/17/2014 1106   GFRNONAA 41 (L) 01/22/2019 1431   GFRNONAA >60 02/20/2014 0806   GFRNONAA >60 07/14/2013 0959   GFRAA 48 (L) 01/22/2019 1431   GFRAA >60 02/20/2014 0806   GFRAA >60 07/14/2013 0959     VAS [place] ABI WITH/WO TBI  Result Date: 01/25/2021  LOWER EXTREMITY DOPPLER STUDY Patient Name:  Broderic Bara  Date of Exam:   01/25/2021 Medical Rec #: [id number]           Accession #:    [id number] Date of Birth: 05-May-1961           Patient Gender: M Patient Age:   24 years Exam Location:  [facility] Procedure:      VAS [place] ABI WITH/WO TBI Referring Phys: Eulogio Ditch --------------------------------------------------------------------------------  Indications: Peripheral artery disease.  Performing Technologist: Almira Coaster RVS  Examination Guidelines: A complete evaluation includes at minimum, Doppler waveform signals and systolic blood pressure reading at the level of bilateral brachial, anterior tibial, and posterior tibial arteries, when vessel segments are accessible. Bilateral testing is considered an integral part of a complete examination. Photoelectric Plethysmograph (PPG) waveforms and toe systolic pressure readings are included as required and additional duplex testing as needed. Limited examinations for reoccurring indications may be performed as noted.  ABI Findings: +---------+------------------+-----+----------+--------+ Right    Rt Pressure (mmHg)IndexWaveform  Comment  +---------+------------------+-----+----------+--------+ Brachial 179                                       +---------+------------------+-----+----------+--------+ ATA      250                    monophasicNC       +---------+------------------+-----+----------+--------+ PTA      113                0.63 monophasic         +---------+------------------+-----+----------+--------+ Great Toe57                0.32 Abnormal           +---------+------------------+-----+----------+--------+ +---------+------------------+-----+----------+-------+ Left     Lt Pressure (mmHg)IndexWaveform  Comment +---------+------------------+-----+----------+-------+ Brachial 177                                      +---------+------------------+-----+----------+-------+ ATA      73                     monophasic.41     +---------+------------------+-----+----------+-------+ PTA      65                0.36 monophasic        +---------+------------------+-----+----------+-------+ Great Toe0                 0.00 Absent            +---------+------------------+-----+----------+-------+ +-------+-----------+-----------+------------+------------+ ABI/TBIToday's ABIToday's TBIPrevious ABIPrevious TBI +-------+-----------+-----------+------------+------------+ Right  >1.0 [place]    .32                                 +-------+-----------+-----------+------------+------------+  Left   .41        0.0                                 +-------+-----------+-----------+------------+------------+  Summary: Right: Resting right ankle-brachial index indicates noncompressible right lower extremity arteries. The right toe-brachial index is abnormal. Left: Resting left ankle-brachial index indicates severe left lower extremity arterial disease. The left toe-brachial index is abnormal.  *See table(s) above for measurements and observations.  Electronically signed by [name] MD on 01/25/2021 at 4:41:49 PM.    Final        Assessment & Plan:   1. Rest pain of both lower extremities due to atherosclerosis [facility]) Recommend:  The patient has evidence of severe atherosclerotic changes of both lower extremities with rest pain that is associated with preulcerative changes and impending tissue loss of  the foot.  This represents a limb threatening ischemia and places the patient at the risk for limb loss.  Patient should undergo angiography of the lower extremities with the hope for intervention for limb salvage.  The risks and benefits as well as the alternative therapies was discussed in detail with the patient.  All questions were answered.  Patient agrees to proceed with angiography.  The patient will follow up with me in the office after the procedure.      - oxyCODONE-acetaminophen (PERCOCET) 10-325 MG tablet; Take 1 tablet by mouth every 4 (four) hours as needed for pain.  Dispense: 30 tablet; Refill: 0  2. Hyperlipidemia, unspecified hyperlipidemia type Continue statin as ordered and reviewed, no changes at this time   3. Primary hypertension Continue antihypertensive medications as already ordered, these medications have been reviewed and there are no changes at this time.    Current Outpatient Medications on File Prior to Visit  Medication Sig Dispense Refill   amLODipine (NORVASC) 5 MG tablet Take 1 tablet (5 mg total) by mouth daily. 30 tablet 1   amLODipine (NORVASC) 5 MG tablet Take 1 tablet by mouth daily.     buPROPion (WELLBUTRIN XL) 300 MG 24 hr tablet Take 1 tablet (300 mg total) by mouth daily. 90 tablet 1   chlorhexidine (PERIDEX) 0.12 % solution 2 (two) times daily.     empagliflozin (JARDIANCE) 25 MG TABS tablet Take 25 mg by mouth daily. 30 tablet 1   FLUCELVAX QUADRIVALENT 0.5 ML injection      insulin aspart (NOVOLOG) 100 UNIT/ML injection INJECT UP TO 120 UNITS UNDER THE SKIN AS DIRECTED DAILY VIA INSULIN PUMP     levothyroxine (SYNTHROID) 75 MCG tablet TAKE 1 TABLET BY MOUTH DAILY AT 6 AM 90 tablet 0   losartan-hydrochlorothiazide (HYZAAR) 100-25 MG tablet TAKE 1 TABLET BY MOUTH EVERY DAY     OLANZapine (ZYPREXA) 15 MG tablet TAKE 1 TABLET(15 MG) BY MOUTH AT BEDTIME 30 tablet 1   pantoprazole (PROTONIX) 40 MG tablet TAKE 1 TABLET(40 MG) BY MOUTH TWICE DAILY      rosuvastatin (CRESTOR) 20 MG tablet Take 20 mg by mouth at bedtime.     venlafaxine XR (EFFEXOR-XR) 150 MG 24 hr capsule TAKE 1 CAPSULE(150 MG) BY MOUTH DAILY WITH BREAKFAST 30 capsule 1   zolpidem (AMBIEN) 10 MG tablet Take 1 tablet (10 mg total) by mouth at bedtime as needed for sleep. 30 tablet 1   glycopyrrolate (ROBINUL) 1 MG tablet Take by mouth.     No current facility-administered medications on file prior to  visit.    There are no Patient Instructions on file for this visit. No follow-ups on file.   Kris Hartmann, NP

## 2021-02-03 ENCOUNTER — Encounter: Payer: Self-pay | Admitting: Psychiatry

## 2021-02-03 ENCOUNTER — Other Ambulatory Visit: Payer: Self-pay

## 2021-02-03 ENCOUNTER — Telehealth (INDEPENDENT_AMBULATORY_CARE_PROVIDER_SITE_OTHER): Payer: HMO | Admitting: Psychiatry

## 2021-02-03 DIAGNOSIS — T50905A Adverse effect of unspecified drugs, medicaments and biological substances, initial encounter: Secondary | ICD-10-CM | POA: Diagnosis not present

## 2021-02-03 DIAGNOSIS — F5101 Primary insomnia: Secondary | ICD-10-CM

## 2021-02-03 DIAGNOSIS — R635 Abnormal weight gain: Secondary | ICD-10-CM

## 2021-02-03 DIAGNOSIS — F411 Generalized anxiety disorder: Secondary | ICD-10-CM

## 2021-02-03 DIAGNOSIS — F3342 Major depressive disorder, recurrent, in full remission: Secondary | ICD-10-CM | POA: Diagnosis not present

## 2021-02-03 MED ORDER — OLANZAPINE 15 MG PO TABS: ORAL_TABLET | ORAL | 1 refills | Status: DC

## 2021-02-03 MED ORDER — VENLAFAXINE HCL ER 150 MG PO CP24: ORAL_CAPSULE | ORAL | 1 refills | Status: DC

## 2021-02-03 NOTE — Progress Notes (Signed)
Virtual Visit via Video Note  I connected with [name] on 02/03/21 at  4:00 PM EDT by a video enabled telemedicine application and verified that I am speaking with the correct person using two identifiers.   Location Provider Location : [facility] Patient Location : Home  Participants: Patient , Provider  I discussed the limitations of evaluation and management by telemedicine and the availability of in person appointments. The patient expressed understanding and agreed to proceed.    I discussed the assessment and treatment plan with the patient. The patient was provided an opportunity to ask questions and all were answered. The patient agreed with the plan and demonstrated an understanding of the instructions.   The patient was advised to call back or seek an in-person evaluation if the symptoms worsen or if the condition fails to improve as anticipated.    [place] MD OP Progress Note  02/03/2021 4:17 PM SINCERE LIUZZI  MRN:  [id number]  Chief Complaint:  Chief Complaint   Follow-up; Anxiety; Depression; Insomnia    HPI: [name] is a 60 year old Caucasian male, married, lives in [place], has a history of MDD, GAD, insomnia, history of tracheal stenosis, status post multiple reintubation, respiratory failure due to opioid overdose, history of CVA, right-sided hemiparesis, diabetes, hypertension, NSTEMI, hypothyroidism, chronic pain was evaluated by telemedicine today.  Patient today reports since being on the Ambien higher dosage his sleep is better.  He sleeps around 8 hours.  He wakes up feeling rested.  He reports his mood symptoms continues to be stable.  Patient however recently went through lower extremity angiography-reports it was on the left side and his left leg is still sore.  Patient with history of peripheral vascular disease is currently under the care of vascular surgery.  Patient denies any side effects to medications.  Denies any suicidality, homicidality  or perceptual disturbances.  Patient denies any other concerns today.  Visit Diagnosis:    ICD-10-CM   1. MDD (major depressive disorder), recurrent, in full remission ([place])  F33.42 OLANZapine (ZYPREXA) 15 MG tablet    2. GAD (generalized anxiety disorder)  F41.1 venlafaxine XR (EFFEXOR-XR) 150 MG 24 hr capsule    3. Primary insomnia  F51.01     4. Weight gain due to medication  R63.5    T50.905A    likely due to psychotropics      Past Psychiatric History: Reviewed past psychiatric history from progress note on 01/15/2019.  Past trials of fluoxetine, Rexulti, Xanax, Abilify.  Patient completed TMS-04/05/2020.  Patient with [place] sessions in the past prior to that.  Patient with history of ECT-did not tolerate it.  Patient with multiple inpatient mental health admissions in 2020.  Past suicide attempt-2014.  Past Medical History:  Past Medical History:  Diagnosis Date   Depression    Diabetes ([place])    Insulin Pump   Diabetes mellitus type I ([place])    Diabetes mellitus without complication ([place])    GERD (gastroesophageal reflux disease)    H/O laryngectomy    Heel bone fracture    Hyperlipidemia    Hypertension    Radicular pain of right lower extremity    Stroke ([place])    Suicide attempt ([place]) 2014   damaged larynx - tracheostomy   Thyroid disease     Past Surgical History:  Procedure Laterality Date   COLONOSCOPY WITH PROPOFOL N/A 05/15/2018   Procedure: COLONOSCOPY WITH PROPOFOL;  Surgeon: [name], [name], MD;  Location: [facility] ENDOSCOPY;  Service: Gastroenterology;  Laterality: N/A;   ESOPHAGOGASTRODUODENOSCOPY N/A 05/15/2018   Procedure: ESOPHAGOGASTRODUODENOSCOPY (EGD);  Surgeon: [name], [name], MD;  Location: [facility] ENDOSCOPY;  Service: Gastroenterology;  Laterality: N/A;   FRACTURE SURGERY     Heel bone reconstruction Left    HERNIA REPAIR  99/3716   Umbilical hernia repair    LARYNGECTOMY     LOWER EXTREMITY ANGIOGRAPHY Left 01/31/2021   Procedure: LOWER EXTREMITY  ANGIOGRAPHY;  Surgeon: Algernon Huxley, MD;  Location: [facility];  Service: Cardiovascular;  Laterality: Left;   NECK SURGERY     fusion   SPINE SURGERY     TRACHEOSTOMY  2014   from [place] attempt    Family Psychiatric History: Reviewed past family psychiatric history from progress note on 01/15/2019  Family History:  Family History  Problem Relation Age of Onset   Osteoporosis Mother    Diabetes Mother    Hypertension Father    Mental illness Neg Hx     Social History: Reviewed social history from progress note on 01/15/2019 Social History   Socioeconomic History   Marital status: Married    Spouse name: Not on file   Number of children: Not on file   Years of education: Not on file   Highest education level: Not on file  Occupational History   Not on file  Tobacco Use   Smoking status: Former    Packs/day: 0.00    Types: Cigarettes    Quit date: 04/09/2013    Years since quitting: 7.8   Smokeless tobacco: Never  Vaping Use   Vaping Use: Never used  Substance and Sexual Activity   Alcohol use: Yes    Alcohol/week: 2.0 standard drinks    Types: 2 Shots of liquor per week    Comment: rare   Drug use: No    Comment: Pt denied; UDS not available   Sexual activity: Yes    Partners: Female    Birth control/protection: Condom  Other Topics Concern   Not on file  Social History Narrative   Not on file   Social Determinants of Health   Financial Resource Strain: Not on file  Food Insecurity: Not on file  Transportation Needs: Not on file  Physical Activity: Not on file  Stress: Not on file  Social Connections: Not on file    Allergies:  Allergies  Allergen Reactions   Buspar [Buspirone]     Makes the patient "flip out"   Depakote [Valproic Acid]     Causes excessive drowsiness   Clopidogrel Rash   Gabapentin Itching and Rash    Metabolic Disorder Labs: Lab Results  Component Value Date   HGBA1C 7.8 (H) 01/01/2019   MPG 177.16 01/01/2019   MPG  174.29 12/23/2018   No results found for: PROLACTIN Lab Results  Component Value Date   CHOL 161 09/15/2016   TRIG 190 (H) 09/15/2016   HDL 41 09/15/2016   CHOLHDL 3.9 09/15/2016   VLDL 38 09/15/2016   LDLCALC 82 09/15/2016   Lab Results  Component Value Date   TSH 1.952 09/15/2016    Therapeutic Level Labs: No results found for: LITHIUM Lab Results  Component Value Date   VALPROATE 19 (L) 09/12/2016   No components found for:  CBMZ  Current Medications: Current Outpatient Medications  Medication Sig Dispense Refill   amLODipine (NORVASC) 5 MG tablet Take 1 tablet (5 mg total) by mouth daily. 30 tablet 1   amLODipine (NORVASC) 5 MG tablet Take 1 tablet by mouth  daily.     apixaban (ELIQUIS) 2.5 MG TABS tablet Take 1 tablet (2.5 mg total) by mouth 2 (two) times daily. 60 tablet 1   aspirin EC 81 MG tablet Take 1 tablet (81 mg total) by mouth daily. 150 tablet 2   buPROPion (WELLBUTRIN XL) 300 MG 24 hr tablet Take 1 tablet (300 mg total) by mouth daily. 90 tablet 1   chlorhexidine (PERIDEX) 0.12 % solution 2 (two) times daily.     clonazePAM (KLONOPIN) 1 MG tablet TAKE 1/2 TO 1 TABLET(0.5 TO 1 MG) BY MOUTH DAILY AS NEEDED FOR ANXIETY OR SEVERE ANXIETY 30 tablet 2   empagliflozin (JARDIANCE) 25 MG TABS tablet Take 25 mg by mouth daily. 30 tablet 1   FLUCELVAX QUADRIVALENT 0.5 ML injection      glycopyrrolate (ROBINUL) 1 MG tablet Take by mouth.     insulin aspart (NOVOLOG) 100 UNIT/ML injection INJECT UP TO 120 UNITS UNDER THE SKIN AS DIRECTED DAILY VIA INSULIN PUMP     levothyroxine (SYNTHROID) 75 MCG tablet TAKE 1 TABLET BY MOUTH DAILY AT 6 AM 90 tablet 0   losartan-hydrochlorothiazide (HYZAAR) 100-25 MG tablet TAKE 1 TABLET BY MOUTH EVERY DAY     OLANZapine (ZYPREXA) 15 MG tablet TAKE 1 TABLET(15 MG) BY MOUTH AT BEDTIME 30 tablet 1   oxyCODONE-acetaminophen (PERCOCET) 10-325 MG tablet Take 1 tablet by mouth every 4 (four) hours as needed for pain. 30 tablet 0   pantoprazole  (PROTONIX) 40 MG tablet TAKE 1 TABLET(40 MG) BY MOUTH TWICE DAILY     rosuvastatin (CRESTOR) 20 MG tablet Take 20 mg by mouth at bedtime.     venlafaxine XR (EFFEXOR-XR) 150 MG 24 hr capsule TAKE 1 CAPSULE(150 MG) BY MOUTH DAILY WITH BREAKFAST 30 capsule 1   zolpidem (AMBIEN) 10 MG tablet Take 1 tablet (10 mg total) by mouth at bedtime as needed for sleep. 30 tablet 1   No current facility-administered medications for this visit.     Musculoskeletal: Strength & Muscle Tone:  UTA Gait & Station:  UTA Patient leans: N/A  Psychiatric Specialty Exam: Review of Systems  Musculoskeletal:        Left LE - behind knee - sore - S/P angiography  Psychiatric/Behavioral:  Negative for agitation, behavioral problems, confusion, dysphoric mood, self-injury, sleep disturbance and suicidal ideas. The patient is not nervous/anxious and is not hyperactive.   All other systems reviewed and are negative.  There were no vitals taken for this visit.There is no Holder or weight on file to calculate BMI.  General Appearance: Casual  Eye Contact:  Fair  Speech:  Slow baseline  Volume:  Normal  Mood:  Euthymic  Affect:  Congruent  Thought Process:  Goal Directed and Descriptions of Associations: Intact  Orientation:  Full (Time, Place, and Person)  Thought Content: Logical   Suicidal Thoughts:  No  Homicidal Thoughts:  No  Memory:  Immediate;   Fair Recent;   Fair Remote;   Fair  Judgement:  Fair  Insight:  Fair  Psychomotor Activity:  Normal  Concentration:  Concentration: Fair and Attention Span: Fair  Recall:  [employer] of Knowledge: Fair  Language: Fair  Akathisia:  No  Handed:  Right  AIMS (if indicated): done  Assets:  Communication Skills Desire for Improvement Housing Intimacy Social Support  ADL's:  Intact  Cognition: WNL  Sleep:   Improved   Screenings: [place] Office Visit from 08/03/2020 in [facility]  AIMS Total Score 0       AUDIT    Flowsheet Row Admission (Discharged) from 01/22/2019 in [facility] Admission (Discharged) from 12/31/2018 in [facility] Admission (Discharged) from 09/14/2016 in [facility]  Alcohol Use Disorder Identification Test Final Score (AUDIT) 0 0 1      ECT-MADRS    Flowsheet Row Admission (Discharged) from 01/22/2019 in [facility] Total Score 27      Mini-Mental    [place] Admission (Discharged) from 01/22/2019 in [facility]  Total Score (max 30 points ) 30      PHQ2-9    Flowsheet Row Video Visit from 11/25/2020 in [facility] Video Visit from 10/26/2020 in [facility] Video Visit from 09/16/2020 in [facility] Video Visit from 08/26/2020 in [facility] Office Visit from 08/03/2020 in [facility]  PHQ-2 Total Score 0 3 3 3 1   PHQ-9 Total Score 5 10 10 12  --      Flowsheet Row Video Visit from 11/25/2020 in [facility] Video Visit from 10/26/2020 in [facility] Office Visit from 08/03/2020 in [facility] Low Risk Low Risk No Risk        Assessment and Plan: WASYL DORNFELD is a 60 year old Caucasian male, married, on disability, history of multiple medical problems, MDD, GAD was evaluated by telemedicine today.  Patient is currently making progress.  Plan as noted below.  Plan MDD in remission Wellbutrin XL 300 mg p.o. daily Effexor extended release 150 mg p.o. daily Zyprexa 15 mg p.o. nightly AIMS - 0   GAD-stable Venlafaxine as prescribed Klonopin 0.5-1 mg as needed for severe anxiety attacks. Reviewed [place] PMP aware Continue CBT  Insomnia-improving Ambien 10 mg p.o. nightly  Follow-up in  clinic in 6 to 8 weeks in person.  This note was generated in part or whole with voice recognition software. Voice recognition is usually quite accurate but there are transcription errors that can and very often do occur. I apologize for any typographical errors that were not detected and corrected.      Ursula Alert, MD 02/04/2021, 8:23 AM

## 2021-02-07 ENCOUNTER — Telehealth (INDEPENDENT_AMBULATORY_CARE_PROVIDER_SITE_OTHER): Payer: Self-pay

## 2021-02-07 NOTE — Telephone Encounter (Signed)
Patient stated that she having burning sensation with right knee to the toes and left ankle to toes is numb with discoloration (purple).

## 2021-02-07 NOTE — Telephone Encounter (Signed)
Let's bring him in for ABIs to ensure we don't have any distal embolization.  Some of the numb toes could be due to him having decreased blood glow for an extended period.

## 2021-02-07 NOTE — Telephone Encounter (Signed)
Can we get a description of the pain?  Some of this may be expected

## 2021-02-08 ENCOUNTER — Other Ambulatory Visit (INDEPENDENT_AMBULATORY_CARE_PROVIDER_SITE_OTHER): Payer: Self-pay | Admitting: Nurse Practitioner

## 2021-02-08 NOTE — Telephone Encounter (Signed)
Patient was called and scheduled ; patient requested pain Rx. Please advise

## 2021-02-09 NOTE — Telephone Encounter (Signed)
Tramadol 50 mg take 1 tablet every 6 hours as needed for pain #30 was left on pharmacy voicemail. Patient is aware that Tramadol will be called into pharmacy

## 2021-02-11 ENCOUNTER — Telehealth (INDEPENDENT_AMBULATORY_CARE_PROVIDER_SITE_OTHER): Payer: Self-pay

## 2021-02-11 NOTE — Telephone Encounter (Signed)
Patient right lower extremity angio new arrival time will be 12:30 pm at the [facility]. Patient spouse has been made aware with new time.

## 2021-02-14 ENCOUNTER — Encounter: Payer: Self-pay | Admitting: Vascular Surgery

## 2021-02-14 ENCOUNTER — Encounter
Admission: RE | Disposition: A | Discharge status: Home / Self Care | Payer: Self-pay | Source: Ambulatory Visit | Attending: Vascular Surgery

## 2021-02-14 ENCOUNTER — Other Ambulatory Visit: Payer: Self-pay

## 2021-02-14 ENCOUNTER — Observation Stay
Admission: RE | Admit: 2021-02-14 | Discharge: 2021-02-15 | Disposition: A | Discharge status: Home / Self Care | Payer: HMO | Source: Ambulatory Visit | Attending: Vascular Surgery | Admitting: Vascular Surgery

## 2021-02-14 ENCOUNTER — Other Ambulatory Visit (INDEPENDENT_AMBULATORY_CARE_PROVIDER_SITE_OTHER): Payer: Self-pay | Admitting: Nurse Practitioner

## 2021-02-14 DIAGNOSIS — Z8249 Family history of ischemic heart disease and other diseases of the circulatory system: Secondary | ICD-10-CM | POA: Insufficient documentation

## 2021-02-14 DIAGNOSIS — Z87891 Personal history of nicotine dependence: Secondary | ICD-10-CM | POA: Diagnosis not present

## 2021-02-14 DIAGNOSIS — Z8673 Personal history of transient ischemic attack (TIA), and cerebral infarction without residual deficits: Secondary | ICD-10-CM | POA: Diagnosis not present

## 2021-02-14 DIAGNOSIS — E1051 Type 1 diabetes mellitus with diabetic peripheral angiopathy without gangrene: Secondary | ICD-10-CM | POA: Diagnosis not present

## 2021-02-14 DIAGNOSIS — I70222 Atherosclerosis of native arteries of extremities with rest pain, left leg: Secondary | ICD-10-CM | POA: Diagnosis not present

## 2021-02-14 DIAGNOSIS — I70223 Atherosclerosis of native arteries of extremities with rest pain, bilateral legs: Secondary | ICD-10-CM

## 2021-02-14 DIAGNOSIS — Z794 Long term (current) use of insulin: Secondary | ICD-10-CM | POA: Diagnosis not present

## 2021-02-14 DIAGNOSIS — Z888 Allergy status to other drugs, medicaments and biological substances status: Secondary | ICD-10-CM | POA: Diagnosis not present

## 2021-02-14 DIAGNOSIS — Z79890 Hormone replacement therapy (postmenopausal): Secondary | ICD-10-CM | POA: Insufficient documentation

## 2021-02-14 DIAGNOSIS — Z7982 Long term (current) use of aspirin: Secondary | ICD-10-CM | POA: Diagnosis not present

## 2021-02-14 DIAGNOSIS — I70229 Atherosclerosis of native arteries of extremities with rest pain, unspecified extremity: Secondary | ICD-10-CM | POA: Diagnosis present

## 2021-02-14 DIAGNOSIS — Z833 Family history of diabetes mellitus: Secondary | ICD-10-CM | POA: Insufficient documentation

## 2021-02-14 DIAGNOSIS — E785 Hyperlipidemia, unspecified: Secondary | ICD-10-CM | POA: Insufficient documentation

## 2021-02-14 DIAGNOSIS — Z79899 Other long term (current) drug therapy: Secondary | ICD-10-CM | POA: Insufficient documentation

## 2021-02-14 DIAGNOSIS — I70219 Atherosclerosis of native arteries of extremities with intermittent claudication, unspecified extremity: Secondary | ICD-10-CM

## 2021-02-14 DIAGNOSIS — I10 Essential (primary) hypertension: Secondary | ICD-10-CM | POA: Diagnosis not present

## 2021-02-14 HISTORY — PX: LOWER EXTREMITY ANGIOGRAPHY: CATH118251

## 2021-02-14 LAB — CBC
HCT: 42.4 % (ref 39.0–52.0)
HCT: 41.7 % (ref 39.0–52.0)
Hemoglobin: 13.9 g/dL (ref 13.0–17.0)
Hemoglobin: 13.7 g/dL (ref 13.0–17.0)
MCH: 30.2 pg (ref 26.0–34.0)
MCH: 30.5 pg (ref 26.0–34.0)
MCHC: 32.8 g/dL (ref 30.0–36.0)
MCHC: 32.9 g/dL (ref 30.0–36.0)
MCV: 92 fL (ref 80.0–100.0)
MCV: 92.9 fL (ref 80.0–100.0)
Platelets: 396 10*3/uL (ref 150–400)
Platelets: 405 10*3/uL — ABNORMAL HIGH (ref 150–400)
RBC: 4.61 MIL/uL (ref 4.22–5.81)
RBC: 4.49 MIL/uL (ref 4.22–5.81)
RDW: 14.2 % (ref 11.5–15.5)
RDW: 14.3 % (ref 11.5–15.5)
WBC: 8.5 10*3/uL (ref 4.0–10.5)
WBC: 10.4 10*3/uL (ref 4.0–10.5)
nRBC: 0 % (ref 0.0–0.2)
nRBC: 0 % (ref 0.0–0.2)

## 2021-02-14 LAB — COMPREHENSIVE METABOLIC PANEL
ALT: 16 U/L (ref 0–44)
AST: 15 U/L (ref 15–41)
Albumin: 3.3 g/dL — ABNORMAL LOW (ref 3.5–5.0)
Alkaline Phosphatase: 117 U/L (ref 38–126)
Anion gap: 11 (ref 5–15)
BUN: 37 mg/dL — ABNORMAL HIGH (ref 6–20)
CO2: 25 mmol/L (ref 22–32)
Calcium: 9.4 mg/dL (ref 8.9–10.3)
Chloride: 103 mmol/L (ref 98–111)
Creatinine, Ser: 1.47 mg/dL — ABNORMAL HIGH (ref 0.61–1.24)
GFR, Estimated: 55 mL/min — ABNORMAL LOW (ref >60)
Glucose, Bld: 117 mg/dL — ABNORMAL HIGH (ref 70–99)
Potassium: 3.9 mmol/L (ref 3.5–5.1)
Sodium: 139 mmol/L (ref 135–145)
Total Bilirubin: 0.5 mg/dL (ref 0.3–1.2)
Total Protein: 7.1 g/dL (ref 6.5–8.1)

## 2021-02-14 LAB — PROTIME-INR
INR: 1.1 (ref 0.8–1.2)
Prothrombin Time: 14.5 seconds (ref 11.4–15.2)

## 2021-02-14 LAB — BUN: BUN: 39 mg/dL — ABNORMAL HIGH (ref 6–20)

## 2021-02-14 LAB — HIV ANTIBODY (ROUTINE TESTING W REFLEX): HIV Screen 4th Generation wRfx: NONREACTIVE

## 2021-02-14 LAB — CREATININE, SERUM
Creatinine, Ser: 1.59 mg/dL — ABNORMAL HIGH (ref 0.61–1.24)
GFR, Estimated: 50 mL/min — ABNORMAL LOW (ref >60)

## 2021-02-14 SURGERY — LOWER EXTREMITY ANGIOGRAPHY
Anesthesia: Moderate Sedation | Site: Leg Lower | Laterality: Right

## 2021-02-14 MED ORDER — TIROFIBAN HCL IN NACL 5-0.9 MG/100ML-% IV SOLN
0.1500 ug/kg/min | INTRAVENOUS | Status: DC
  Administered 2021-02-15 (×2): 0.15 ug/kg/min via INTRAVENOUS
  Filled 2021-02-14 (×4): qty 100

## 2021-02-14 MED ORDER — OLANZAPINE 7.5 MG PO TABS
15.0000 mg | ORAL_TABLET | Freq: Every day | ORAL | Status: DC
  Administered 2021-02-14: 15 mg via ORAL
  Filled 2021-02-14: qty 2
  Filled 2021-02-14: qty 3

## 2021-02-14 MED ORDER — LEVOTHYROXINE SODIUM 50 MCG PO TABS
75.0000 ug | ORAL_TABLET | Freq: Every day | ORAL | Status: DC
  Administered 2021-02-15: 75 ug via ORAL
  Filled 2021-02-14 (×2): qty 1

## 2021-02-14 MED ORDER — PHENOL 1.4 % MT LIQD
1.0000 | OROMUCOSAL | Status: DC | PRN
  Filled 2021-02-14: qty 177

## 2021-02-14 MED ORDER — AMLODIPINE BESYLATE 5 MG PO TABS
5.0000 mg | ORAL_TABLET | Freq: Every day | ORAL | Status: DC
  Administered 2021-02-15: 5 mg via ORAL
  Filled 2021-02-14: qty 1

## 2021-02-14 MED ORDER — HEPARIN SODIUM (PORCINE) 1000 UNIT/ML IJ SOLN
INTRAMUSCULAR | Status: DC | PRN
  Administered 2021-02-14: 5000 [IU] via INTRAVENOUS

## 2021-02-14 MED ORDER — CEFAZOLIN SODIUM-DEXTROSE 2-4 GM/100ML-% IV SOLN: 2.0000 g | Freq: Once | INTRAVENOUS | Status: AC

## 2021-02-14 MED ORDER — MIDAZOLAM HCL 2 MG/2ML IJ SOLN
INTRAMUSCULAR | Status: AC
  Filled 2021-02-14: qty 4

## 2021-02-14 MED ORDER — MIDAZOLAM HCL 2 MG/2ML IJ SOLN
INTRAMUSCULAR | Status: DC | PRN
  Administered 2021-02-14: 1 mg via INTRAVENOUS
  Administered 2021-02-14: 2 mg via INTRAVENOUS
  Administered 2021-02-14: 1 mg via INTRAVENOUS

## 2021-02-14 MED ORDER — HEPARIN SODIUM (PORCINE) 1000 UNIT/ML IJ SOLN
INTRAMUSCULAR | Status: AC
  Filled 2021-02-14: qty 1

## 2021-02-14 MED ORDER — CLONAZEPAM 0.5 MG PO TABS
0.5000 mg | ORAL_TABLET | Freq: Three times a day (TID) | ORAL | Status: DC | PRN
  Filled 2021-02-14: qty 1

## 2021-02-14 MED ORDER — MIDAZOLAM HCL 2 MG/ML PO SYRP: 8.0000 mg | ORAL_SOLUTION | Freq: Once | ORAL | Status: DC | PRN

## 2021-02-14 MED ORDER — PANTOPRAZOLE SODIUM 40 MG PO TBEC
40.0000 mg | DELAYED_RELEASE_TABLET | Freq: Every day | ORAL | Status: DC
  Administered 2021-02-15: 40 mg via ORAL
  Filled 2021-02-14 (×2): qty 1

## 2021-02-14 MED ORDER — ALUM & MAG HYDROXIDE-SIMETH 200-200-20 MG/5ML PO SUSP: 15.0000 mL | ORAL | Status: DC | PRN

## 2021-02-14 MED ORDER — CEFAZOLIN SODIUM-DEXTROSE 2-4 GM/100ML-% IV SOLN
INTRAVENOUS | Status: AC
  Administered 2021-02-14: 2 g via INTRAVENOUS
  Filled 2021-02-14: qty 100

## 2021-02-14 MED ORDER — LOSARTAN POTASSIUM 50 MG PO TABS
100.0000 mg | ORAL_TABLET | Freq: Every day | ORAL | Status: DC
  Administered 2021-02-15: 100 mg via ORAL
  Filled 2021-02-14 (×2): qty 2

## 2021-02-14 MED ORDER — TIROFIBAN HCL IV 12.5 MG/250 ML
INTRAVENOUS | Status: AC
  Filled 2021-02-14: qty 250

## 2021-02-14 MED ORDER — INSULIN ASPART 100 UNIT/ML IJ SOLN: 0.0000 [IU] | Freq: Three times a day (TID) | INTRAMUSCULAR | Status: DC

## 2021-02-14 MED ORDER — LOSARTAN POTASSIUM-HCTZ 100-25 MG PO TABS: 1.0000 | ORAL_TABLET | Freq: Every day | ORAL | Status: DC

## 2021-02-14 MED ORDER — HYDROCHLOROTHIAZIDE 25 MG PO TABS
25.0000 mg | ORAL_TABLET | Freq: Every day | ORAL | Status: DC
  Administered 2021-02-15: 25 mg via ORAL
  Filled 2021-02-14 (×2): qty 1

## 2021-02-14 MED ORDER — GUAIFENESIN-DM 100-10 MG/5ML PO SYRP
15.0000 mL | ORAL_SOLUTION | ORAL | Status: DC | PRN
  Filled 2021-02-14: qty 15

## 2021-02-14 MED ORDER — OXYCODONE-ACETAMINOPHEN 10-325 MG PO TABS: 1.0000 | ORAL_TABLET | ORAL | Status: DC | PRN

## 2021-02-14 MED ORDER — FENTANYL CITRATE PF 50 MCG/ML IJ SOSY
PREFILLED_SYRINGE | INTRAMUSCULAR | Status: AC
  Filled 2021-02-14: qty 1

## 2021-02-14 MED ORDER — PANTOPRAZOLE SODIUM 40 MG PO TBEC: 40.0000 mg | DELAYED_RELEASE_TABLET | Freq: Every day | ORAL | Status: DC

## 2021-02-14 MED ORDER — ASPIRIN EC 81 MG PO TBEC
81.0000 mg | DELAYED_RELEASE_TABLET | Freq: Every day | ORAL | Status: DC
  Administered 2021-02-15: 81 mg via ORAL
  Filled 2021-02-14: qty 1

## 2021-02-14 MED ORDER — METOPROLOL TARTRATE 5 MG/5ML IV SOLN: 2.0000 mg | INTRAVENOUS | Status: DC | PRN

## 2021-02-14 MED ORDER — TIROFIBAN (AGGRASTAT) BOLUS VIA INFUSION
25.0000 ug/kg | Freq: Once | INTRAVENOUS | Status: DC
  Administered 2021-02-15: 3187.5 ug via INTRAVENOUS
  Filled 2021-02-14: qty 64

## 2021-02-14 MED ORDER — DIPHENHYDRAMINE HCL 50 MG/ML IJ SOLN: 50.0000 mg | Freq: Once | INTRAMUSCULAR | Status: DC | PRN

## 2021-02-14 MED ORDER — ZOLPIDEM TARTRATE 5 MG PO TABS
10.0000 mg | ORAL_TABLET | Freq: Every evening | ORAL | Status: DC | PRN
  Filled 2021-02-14: qty 1

## 2021-02-14 MED ORDER — METHYLPREDNISOLONE SODIUM SUCC 125 MG IJ SOLR: 125.0000 mg | Freq: Once | INTRAMUSCULAR | Status: DC | PRN

## 2021-02-14 MED ORDER — ONDANSETRON HCL 4 MG/2ML IJ SOLN: 4.0000 mg | Freq: Four times a day (QID) | INTRAMUSCULAR | Status: DC | PRN

## 2021-02-14 MED ORDER — ALTEPLASE 1 MG/ML SYRINGE FOR VASCULAR PROCEDURE
INTRAMUSCULAR | Status: DC | PRN
  Administered 2021-02-14: 4 mg via INTRA_ARTERIAL

## 2021-02-14 MED ORDER — ROSUVASTATIN CALCIUM 10 MG PO TABS
20.0000 mg | ORAL_TABLET | Freq: Every day | ORAL | Status: DC
  Administered 2021-02-14: 20 mg via ORAL
  Filled 2021-02-14: qty 2

## 2021-02-14 MED ORDER — OXYCODONE HCL 5 MG PO TABS
10.0000 mg | ORAL_TABLET | ORAL | Status: DC | PRN
  Administered 2021-02-14 – 2021-02-15 (×5): 10 mg via ORAL
  Filled 2021-02-14 (×5): qty 2

## 2021-02-14 MED ORDER — FENTANYL CITRATE (PF) 100 MCG/2ML IJ SOLN
INTRAMUSCULAR | Status: DC | PRN
  Administered 2021-02-14: 50 ug via INTRAVENOUS
  Administered 2021-02-14 (×2): 25 ug via INTRAVENOUS
  Administered 2021-02-14: 50 ug via INTRAVENOUS

## 2021-02-14 MED ORDER — POTASSIUM CHLORIDE CRYS ER 20 MEQ PO TBCR: 20.0000 meq | EXTENDED_RELEASE_TABLET | Freq: Once | ORAL | Status: DC

## 2021-02-14 MED ORDER — NITROGLYCERIN 1 MG/10 ML FOR IR/CATH LAB
INTRA_ARTERIAL | Status: DC | PRN
  Administered 2021-02-14: 300 ug via INTRA_ARTERIAL

## 2021-02-14 MED ORDER — FAMOTIDINE 20 MG PO TABS: 40.0000 mg | ORAL_TABLET | Freq: Once | ORAL | Status: DC | PRN

## 2021-02-14 MED ORDER — ACETAMINOPHEN 325 MG PO TABS
325.0000 mg | ORAL_TABLET | ORAL | Status: DC | PRN
  Administered 2021-02-15 (×4): 325 mg via ORAL
  Filled 2021-02-14 (×4): qty 1

## 2021-02-14 MED ORDER — VENLAFAXINE HCL ER 75 MG PO CP24
75.0000 mg | ORAL_CAPSULE | Freq: Every day | ORAL | Status: DC
  Administered 2021-02-15: 75 mg via ORAL
  Filled 2021-02-14 (×2): qty 1

## 2021-02-14 MED ORDER — EMPAGLIFLOZIN 25 MG PO TABS
25.0000 mg | ORAL_TABLET | Freq: Every day | ORAL | Status: DC
Start: 2021-02-15 — End: 2021-02-15
  Administered 2021-02-15: 25 mg via ORAL
  Filled 2021-02-14: qty 1

## 2021-02-14 MED ORDER — SODIUM CHLORIDE 0.9 % IV SOLN: INTRAVENOUS | Status: DC

## 2021-02-14 MED ORDER — HYDROMORPHONE HCL 1 MG/ML IJ SOLN
1.0000 mg | Freq: Once | INTRAMUSCULAR | Status: AC | PRN
  Administered 2021-02-14: 1 mg via INTRAVENOUS
  Filled 2021-02-14: qty 1

## 2021-02-14 MED ORDER — HYDRALAZINE HCL 20 MG/ML IJ SOLN
5.0000 mg | INTRAMUSCULAR | Status: DC | PRN
Start: 2021-02-14 — End: 2021-02-15

## 2021-02-14 MED ORDER — FENTANYL CITRATE (PF) 100 MCG/2ML IJ SOLN
INTRAMUSCULAR | Status: AC
  Filled 2021-02-14: qty 2

## 2021-02-14 MED ORDER — LABETALOL HCL 5 MG/ML IV SOLN: 10.0000 mg | INTRAVENOUS | Status: DC | PRN

## 2021-02-14 MED ORDER — ALTEPLASE 2 MG IJ SOLR
INTRAMUSCULAR | Status: AC
  Filled 2021-02-14: qty 4

## 2021-02-14 MED ORDER — BUPROPION HCL ER (XL) 150 MG PO TB24
300.0000 mg | ORAL_TABLET | Freq: Every day | ORAL | Status: DC
  Administered 2021-02-15: 300 mg via ORAL
  Filled 2021-02-14: qty 1
  Filled 2021-02-14: qty 2

## 2021-02-14 SURGICAL SUPPLY — 25 items
BALLN LUTONIX 018 4X220X130 (BALLOONS) ×2
BALLN LUTONIX 018 6X150X130 (BALLOONS) ×2
BALLN ULTRVRSE 2.5X300X150 (BALLOONS) ×2
BALLN ULTRVRSE 3X150X150 (BALLOONS) ×2
BALLOON LUTONIX 018 4X220X130 (BALLOONS) ×1 IMPLANT
BALLOON LUTONIX 018 6X150X130 (BALLOONS) ×1 IMPLANT
BALLOON ULTRVRSE 2.5X300X150 (BALLOONS) ×1 IMPLANT
BALLOON ULTRVRSE 3X150X150 (BALLOONS) ×1 IMPLANT
CANISTER PENUMBRA ENGINE (MISCELLANEOUS) ×2 IMPLANT
CATH 0.018 NAVICROSS ANG 135 (CATHETERS) ×2 IMPLANT
CATH INDIGO CAT6 KIT (CATHETERS) ×2 IMPLANT
CATH SEEKER .035X135CM (CATHETERS) ×2 IMPLANT
CATH TEMPO 5F RIM 65CM (CATHETERS) ×2 IMPLANT
CATH VERT 5FR 125CM (CATHETERS) ×2 IMPLANT
COVER PROBE U/S 5X48 (MISCELLANEOUS) ×2 IMPLANT
DEVICE SAFEGUARD 24CM (GAUZE/BANDAGES/DRESSINGS) ×2 IMPLANT
DEVICE STARCLOSE SE CLOSURE (Vascular Products) ×2 IMPLANT
GLIDEWIRE ADV .035X260CM (WIRE) ×2 IMPLANT
KIT ENCORE 26 ADVANTAGE (KITS) ×2 IMPLANT
PACK ANGIOGRAPHY (CUSTOM PROCEDURE TRAY) ×2 IMPLANT
SHEATH BRITE TIP 5FRX11 (SHEATH) ×2 IMPLANT
SHEATH PINNACLE ST 6F 45CM (SHEATH) ×2 IMPLANT
STENT VIABAHN 6X150X120 (Permanent Stent) ×2 IMPLANT
WIRE G V18X300CM (WIRE) ×2 IMPLANT
WIRE GUIDERIGHT .035X150 (WIRE) ×2 IMPLANT

## 2021-02-14 NOTE — Progress Notes (Signed)
Respiratory at bedside to suction patient's trach. Suctioned x 2 with removal of thick secretions. Patient tolerated well. Will continue to monitor.

## 2021-02-14 NOTE — Interval H&P Note (Signed)
History and Physical Interval Note:  02/14/2021 12:52 PM  [name]  has presented today for surgery, with the diagnosis of RLE Angiography   ASO with claudication   BARD Rep  cc: [name].  The various methods of treatment have been discussed with the patient and family. After consideration of risks, benefits and other options for treatment, the patient has consented to  Procedure(s): LOWER EXTREMITY ANGIOGRAPHY (Right) as a surgical intervention.  The patient's history has been reviewed, patient examined, no change in status, stable for surgery.  I have reviewed the patient's chart and labs.  Questions were answered to the patient's satisfaction.     [name]

## 2021-02-14 NOTE — Progress Notes (Signed)
Bambi, resp. Therapist here . She suctioned pt. X 1 via trach. Pt. Tolerated well. Thick white secretions removed. Unable to obtain CO2 level secondary to trach/stoma. In no resp. Distress on arrival. Pt. Requesting left leg to be worked on today instead of right , stating "my left leg has been hurting me since I left last time." Dr. Lucky Cowboy made aware. No consent obtained for left leg angio and possible Percutaneous intervention.

## 2021-02-14 NOTE — Progress Notes (Signed)
Pt. Has his own BG monitor: BG now=132. Pt. Without any cardiac, or subjective c/o. Left foot mottled(purplish) per previous. On admission. Pt. States left leg "achy" but not painful.

## 2021-02-14 NOTE — Op Note (Signed)
[facility]  Percutaneous Study/Intervention Procedural Note   Date of Surgery: 02/14/2021  Surgeon(s):Marcelles Clinard    Assistants:none  Pre-operative Diagnosis: PAD with rest Holder left lower extremity with acute on chronic ischemia on top of recent intervention  Post-operative diagnosis:  Same  Procedure(s) Performed:             1.  Ultrasound guidance for vascular access right femoral artery             2.  Catheter placement into left SFA from right femoral approach             3.  Selective left lower extremity angiogram             4.  Catheter directed thrombolytic therapy with 4 mg of tPA to the left popliteal artery and tibioperoneal trunk             5.  Mechanical thrombectomy to the left popliteal artery, tibioperoneal trunk, posterior tibial artery, and peroneal arteries  6.  Percutaneous transluminal angioplasty of the left posterior tibial arteries with 2-1/2 and 3 mm diameter angioplasty balloons  7.  Percutaneous transluminal angioplasty of left popliteal artery with 4 mm diameter Lutonix drug-coated angioplasty balloon  8.  Viabahn stent placement to the left popliteal artery with 6 mm diameter by 15 cm length stent for residual thrombus and stenosis after above procedures  9.  Percutaneous transluminal angioplasty of left peroneal artery with 2.5 mm diameter angioplasty balloon             10.  StarClose closure device right femoral artery  EBL: 100 cc  Contrast: 45 cc  Fluoro Time: 14.4 minutes  Moderate Conscious Sedation Time: approximately 65 minutes using 4 mg of Versed and 150 mcg of Fentanyl              Indications:  Patient is a 60 y.o.male with recurrent ischemia of the left foot after an intervention last month. The patient is brought in for angiography for further evaluation and potential treatment.  Due to the limb threatening nature of the situation, angiogram was performed for attempted limb salvage. The patient is aware that if  the procedure fails, amputation would be expected.  The patient also understands that even with successful revascularization, amputation may still be required due to the severity of the situation.  Risks and benefits are discussed and informed consent is obtained.   Procedure:  The patient was identified and appropriate procedural time out was performed.  The patient was then placed supine on the table and prepped and draped in the usual sterile fashion. Moderate conscious sedation was administered during a face to face encounter with the patient throughout the procedure with my supervision of the RN administering medicines and monitoring the patient's vital signs, pulse oximetry, telemetry and mental status throughout from the start of the procedure until the patient was taken to the recovery room. Ultrasound was used to evaluate the right common femoral artery.  It was patent .  A digital ultrasound image was acquired.  A Seldinger needle was used to access the right common femoral artery under direct ultrasound guidance and a permanent image was performed.  A 0.035 J wire was advanced without resistance and a 5Fr sheath was placed. I then crossed the aortic bifurcation and advanced to the left femoral head and then into the proximal left SFA. Selective left lower extremity angiogram was then performed. This demonstrated fairly normal common femoral artery, profunda femoris  artery, and superficial femoral artery with an abrupt occlusion in the above-knee popliteal artery few centimeters above the previously placed stent.  The stent went down to the tibioperoneal trunk and remained occluded with the only distal reconstitution initially seen being a distal peroneal artery. It was felt that it was in the patient's best interest to proceed with intervention after these images to avoid a second procedure and a larger amount of contrast and fluoroscopy based off of the findings from the initial angiogram. The patient  was systemically heparinized and a 6 [place] destination sheath was then placed over the Terumo Advantage wire. I then used a Kumpe catheter and the advantage wire to navigate into the popliteal occlusion and the wire easily tracked down through the occlusion and into the distal posterior tibial artery.  I then placed a Kumpe catheter and exchanged for a V 18 wire which was parked in the foot.  Through the Kumpe catheter, 4 mg tPA were given in the left popliteal artery and tibioperoneal trunk.  After this dwelled, passes with the penumbra CAT 6 device performed for mechanical thrombectomy to the left popliteal artery, tibioperoneal trunk, and posterior tibial arteries.  Several passes were made with only a small amount of thrombus removed.  There still remained minimal flow.  I then used a 2.5 mm diameter by 30 cm length angioplasty balloon inflating this from the foot up to the distal popliteal artery at the bottom of the previously placed stent.  I then used 4 mm diameter by 22 cm length Lutonix drug-coated angioplasty balloon to inflate from the tibioperoneal trunk up through the popliteal artery encompassing the entirety the previously placed stent.  Following this, there remained high-grade residual stenosis at the proximal edge of the previously placed stent as well as thrombus within the proximal portion of the stent.  The distal portion of the stent was now patent the peroneal artery at this point was still occluded in the posterior tibial artery continued to have occlusion distally and high-grade stenosis in the mid and proximal segments.  A 6 mm diameter by 15 cm length Viabahn stent was deployed in the popliteal artery going several centimeters proximal previously placed stent and then postdilated with a 6 mm diameter Lutonix drug-coated angioplasty balloon.  Upsized to a 3 mm diameter angioplasty balloon for the posterior tibial artery down from the proximal foot up to the origin inflating this from 4 to  6 atm.  Completion imaging still showed residual occlusion of the distal posterior tibial artery with collateral flow now filling the peroneal artery.  I then turned my attention to the peroneal artery.  Using the seeker catheter and the V 18 wire was able to gain access to the peroneal artery and parked the wire distally.  I then perform mechanical thrombectomy as well in the peroneal artery using the penumbra CAT 6 device as far as it would go which was the mid peroneal artery.  Following this there still remained occlusion of the peroneal artery and I used a 2.5 mm diameter by 30 cm length angioplasty balloon and inflated this from the distal peroneal artery up to the tibioperoneal trunk.  Completion imaging showed now the peroneal artery to have flow although did remain some residual distal debris and I felt the only way to treat this would be with Aggrastat.  Less than 30% residual stenosis was present throughout the majority of the peroneal artery and tibioperoneal trunk after angioplasty and thrombectomy.  Less than 10% residual stenosis  was seen in the popliteal artery. I elected to terminate the procedure. The sheath was removed and StarClose closure device was deployed in the right femoral artery with excellent hemostatic result. The patient was taken to the recovery room in stable condition having tolerated the procedure well.  Findings:                        Left Lower Extremity: Fairly normal common femoral artery, profunda femoris artery, and superficial femoral artery with an abrupt occlusion in the above-knee popliteal artery few centimeters above the previously placed stent.  The stent went down to the tibioperoneal trunk and remained occluded with the only distal reconstitution initially seen being a distal peroneal artery.   Disposition: Patient was taken to the recovery room in stable condition having tolerated the procedure well.  Complications: None  [name] 02/14/2021 3:37  PM   This note was created with Dragon Medical transcription system. Any errors in dictation are purely unintentional.

## 2021-02-14 NOTE — Progress Notes (Signed)
Dr. Lucky Cowboy in recovery now, speaking with pt. Re: procedural results. Pt. Verbalized understanding of conversation. Aggrastat gtt. Started now per protocol. Phoned pharmacy to confirm Creatinine clearance of 69.9 and confirm Aggrastat gtt settings.

## 2021-02-15 DIAGNOSIS — E1051 Type 1 diabetes mellitus with diabetic peripheral angiopathy without gangrene: Secondary | ICD-10-CM | POA: Diagnosis not present

## 2021-02-15 DIAGNOSIS — I70229 Atherosclerosis of native arteries of extremities with rest pain, unspecified extremity: Secondary | ICD-10-CM

## 2021-02-15 LAB — CBC
HCT: 41.6 % (ref 39.0–52.0)
Hemoglobin: 13.8 g/dL (ref 13.0–17.0)
MCH: 31 pg (ref 26.0–34.0)
MCHC: 33.2 g/dL (ref 30.0–36.0)
MCV: 93.5 fL (ref 80.0–100.0)
Platelets: 412 10*3/uL — ABNORMAL HIGH (ref 150–400)
RBC: 4.45 MIL/uL (ref 4.22–5.81)
RDW: 14.3 % (ref 11.5–15.5)
WBC: 10.8 10*3/uL — ABNORMAL HIGH (ref 4.0–10.5)
nRBC: 0 % (ref 0.0–0.2)

## 2021-02-15 LAB — HEMOGLOBIN A1C
Hgb A1c MFr Bld: 7.8 % — ABNORMAL HIGH (ref 4.8–5.6)
Mean Plasma Glucose: 177 mg/dL

## 2021-02-15 MED ORDER — APIXABAN 5 MG PO TABS: 5.0000 mg | ORAL_TABLET | Freq: Two times a day (BID) | ORAL | 5 refills | Status: AC

## 2021-02-15 MED ORDER — OXYCODONE-ACETAMINOPHEN 10-325 MG PO TABS: 1.0000 | ORAL_TABLET | ORAL | 0 refills | Status: DC | PRN

## 2021-02-15 MED ORDER — APIXABAN 5 MG PO TABS
5.0000 mg | ORAL_TABLET | Freq: Two times a day (BID) | ORAL | Status: DC
  Administered 2021-02-15: 5 mg via ORAL
  Filled 2021-02-15: qty 1

## 2021-02-15 NOTE — Discharge Instructions (Signed)
Vascular Surgery Discharge Instructions:  1) you may shower.  Please keep your groins clean and dry.  Gently clean your groins with soap and water.  Gently pat dry. 2) please do not engage in strenuous activity or lifting greater than 10 pounds until you are cleared at your first post procedure follow-up. 3) please note that we increase the dosage of your Eliquis.

## 2021-02-15 NOTE — Progress Notes (Signed)
Discharge instructions (including medications, follow up appointments and Catheter site care) discussed with and copy provided to patient/caregiver

## 2021-02-15 NOTE — Discharge Summary (Signed)
[facility]    Discharge Summary  Patient ID:  [name] MRN: [id number] DOB/AGE: Sep 27, 1960 60 y.o.  Admit date: 02/14/2021 Discharge date: 02/15/2021 Date of Surgery: 02/14/2021 Surgeon: Surgeon(s): Lucky Cowboy Erskine Squibb, MD  Admission Diagnosis: Atherosclerosis of artery of extremity with rest pain [facility]) [I70.229]  Discharge Diagnoses:  Atherosclerosis of artery of extremity with rest pain [facility]) [I70.229]  Secondary Diagnoses: Past Medical History:  Diagnosis Date   Depression    Diabetes ([place])    Insulin Pump   Diabetes mellitus type I ([place])    Diabetes mellitus without complication ([place])    GERD (gastroesophageal reflux disease)    H/O laryngectomy    Heel bone fracture    Hyperlipidemia    Hypertension    Radicular pain of right lower extremity    Stroke ([place])    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  Ultrasound guidance for vascular access right femoral artery             2.  Catheter placement into left SFA from right femoral approach             3.  Selective left lower extremity angiogram             4.  Catheter directed thrombolytic therapy with 4 mg of tPA to the left popliteal artery and tibioperoneal trunk             5.  Mechanical thrombectomy to the left popliteal artery, tibioperoneal trunk, posterior tibial artery, and peroneal arteries             6.  Percutaneous transluminal angioplasty of the left posterior tibial arteries with 2-1/2 and 3 mm diameter angioplasty balloons             7.  Percutaneous transluminal angioplasty of left popliteal artery with 4 mm diameter Lutonix drug-coated angioplasty balloon             8.  Viabahn stent placement to the left popliteal artery with 6 mm diameter by 15 cm length stent for residual thrombus and stenosis after above procedures             9.  Percutaneous transluminal angioplasty of left peroneal  artery with 2.5 mm diameter angioplasty balloon             10.  StarClose closure device right femoral artery  Discharged Condition: Good  HPI / Hospital Course:  Patient is a 61 year old male with recurrent ischemia of the left foot after an intervention last month. The patient is brought in for angiography for further evaluation and potential treatment.  Due to the limb threatening nature of the situation, angiogram was performed for attempted limb salvage. The patient is aware that if the procedure fails, amputation would be expected.  The patient also understands that even with successful revascularization, amputation may still be required due to the severity of the situation.  Risks and benefits are discussed and informed consent is obtained. On 02/14/21, the patient underwent:              1.  Ultrasound guidance for vascular access right femoral artery             2.  Catheter placement into left SFA from right femoral approach  3.  Selective left lower extremity angiogram             4.  Catheter directed thrombolytic therapy with 4 mg of tPA to the left popliteal artery and tibioperoneal trunk             5.  Mechanical thrombectomy to the left popliteal artery, tibioperoneal trunk, posterior tibial artery, and peroneal arteries             6.  Percutaneous transluminal angioplasty of the left posterior tibial arteries with 2-1/2 and 3 mm diameter angioplasty balloons             7.  Percutaneous transluminal angioplasty of left popliteal artery with 4 mm diameter Lutonix drug-coated angioplasty balloon             8.  Viabahn stent placement to the left popliteal artery with 6 mm diameter by 15 cm length stent for residual thrombus and stenosis after above procedures             9.  Percutaneous transluminal angioplasty of left peroneal artery with 2.5 mm diameter angioplasty balloon             10.  StarClose closure device right femoral artery  Patient tolerated the  procedure and was transferred from the angiography suite to the surgical floor for administration of Aggrastat overnight.  Patient's night of surgery was unremarkable.  During his brief stay, his diet was advanced, his discomfort was controlled with the use of p.o. pain medication, he was urinating and he was ambulating at baseline.  Day of discharge, the patient was afebrile with an improved physical exam.  Labs: As below  Complications: None  Consults: None  Significant Diagnostic Studies: CBC Lab Results  Component Value Date   WBC 10.8 (H) 02/15/2021   HGB 13.8 02/15/2021   HCT 41.6 02/15/2021   MCV 93.5 02/15/2021   PLT 412 (H) 02/15/2021   BMET    Component Value Date/Time   NA 139 02/14/2021 1731   NA 140 02/20/2014 0806   K 3.9 02/14/2021 1731   K 4.5 02/20/2014 0806   CL 103 02/14/2021 1731   CL 107 02/20/2014 0806   CO2 25 02/14/2021 1731   CO2 28 02/20/2014 0806   GLUCOSE 117 (H) 02/14/2021 1731   GLUCOSE 201 (H) 02/20/2014 0806   BUN 37 (H) 02/14/2021 1731   BUN 18 02/20/2014 0806   CREATININE 1.47 (H) 02/14/2021 1731   CREATININE 1.26 02/20/2014 0806   CALCIUM 9.4 02/14/2021 1731   CALCIUM 8.7 02/20/2014 0806   GFRNONAA 55 (L) 02/14/2021 1731   GFRNONAA >60 02/20/2014 0806   GFRNONAA >60 07/14/2013 0959   GFRAA 48 (L) 01/22/2019 1431   GFRAA >60 02/20/2014 0806   GFRAA >60 07/14/2013 0959   COAG Lab Results  Component Value Date   INR 1.1 02/14/2021   INR 1.0 02/17/2014   INR 1.3 05/04/2013   Disposition:  Discharge to :Home  Allergies as of 02/15/2021       Reactions   Buspar [buspirone]    Makes the patient "flip out"   Depakote [valproic Acid]    Causes excessive drowsiness   Clopidogrel Rash   Gabapentin Itching, Rash        Medication List     TAKE these medications    amLODipine 5 MG tablet Commonly known as: NORVASC Take 1 tablet (5 mg total) by mouth daily.   amLODipine 5 MG tablet Commonly known as: NORVASC  Take 1  tablet by mouth daily.   apixaban 5 MG Tabs tablet Commonly known as: ELIQUIS Take 1 tablet (5 mg total) by mouth 2 (two) times daily. What changed:  medication strength how much to take   aspirin EC 81 MG tablet Take 1 tablet (81 mg total) by mouth daily.   buPROPion 300 MG 24 hr tablet Commonly known as: Wellbutrin XL Take 1 tablet (300 mg total) by mouth daily.   chlorhexidine 0.12 % solution Commonly known as: PERIDEX 2 (two) times daily.   clonazePAM 1 MG tablet Commonly known as: KLONOPIN TAKE 1/2 TO 1 TABLET(0.5 TO 1 MG) BY MOUTH DAILY AS NEEDED FOR ANXIETY OR SEVERE ANXIETY   empagliflozin 25 MG Tabs tablet Commonly known as: JARDIANCE Take 25 mg by mouth daily.   Flucelvax Quadrivalent 0.5 ML injection Generic drug: influenza vaccine   glycopyrrolate 1 MG tablet Commonly known as: ROBINUL Take by mouth.   insulin aspart 100 UNIT/ML injection Commonly known as: novoLOG INJECT UP TO 120 UNITS UNDER THE SKIN AS DIRECTED DAILY VIA INSULIN PUMP   levothyroxine 75 MCG tablet Commonly known as: SYNTHROID TAKE 1 TABLET BY MOUTH DAILY AT 6 AM   losartan-hydrochlorothiazide 100-25 MG tablet Commonly known as: HYZAAR TAKE 1 TABLET BY MOUTH EVERY DAY   OLANZapine 15 MG tablet Commonly known as: ZYPREXA TAKE 1 TABLET(15 MG) BY MOUTH AT BEDTIME   oxyCODONE-acetaminophen 10-325 MG tablet Commonly known as: Percocet Take 1 tablet by mouth every 4 (four) hours as needed for pain.   pantoprazole 40 MG tablet Commonly known as: PROTONIX TAKE 1 TABLET(40 MG) BY MOUTH TWICE DAILY   rosuvastatin 20 MG tablet Commonly known as: CRESTOR Take 20 mg by mouth at bedtime.   venlafaxine XR 150 MG 24 hr capsule Commonly known as: EFFEXOR-XR TAKE 1 CAPSULE(150 MG) BY MOUTH DAILY WITH BREAKFAST   zolpidem 10 MG tablet Commonly known as: AMBIEN Take 1 tablet (10 mg total) by mouth at bedtime as needed for sleep.       Verbal and written Discharge instructions given  to the patient. Wound care per Discharge AVS  Follow-up Information     Dew, Erskine Squibb, MD Follow up in 2 week(s).   Specialties: Vascular Surgery, Radiology, Interventional Cardiology Why: Can see Dew or Arna Medici. Will need ABI with visit. Contact information: [place] [address] [phone]                Signed: Sela Hua, PA-C 02/15/2021, 12:24 PM

## 2021-02-18 ENCOUNTER — Other Ambulatory Visit (INDEPENDENT_AMBULATORY_CARE_PROVIDER_SITE_OTHER): Payer: Self-pay | Admitting: Nurse Practitioner

## 2021-02-18 DIAGNOSIS — R809 Proteinuria, unspecified: Secondary | ICD-10-CM | POA: Diagnosis not present

## 2021-02-18 DIAGNOSIS — I70223 Atherosclerosis of native arteries of extremities with rest pain, bilateral legs: Secondary | ICD-10-CM

## 2021-02-18 DIAGNOSIS — Z95820 Peripheral vascular angioplasty status with implants and grafts: Secondary | ICD-10-CM

## 2021-02-18 DIAGNOSIS — E1029 Type 1 diabetes mellitus with other diabetic kidney complication: Secondary | ICD-10-CM | POA: Diagnosis not present

## 2021-02-21 ENCOUNTER — Ambulatory Visit (INDEPENDENT_AMBULATORY_CARE_PROVIDER_SITE_OTHER): Payer: HMO

## 2021-02-21 ENCOUNTER — Ambulatory Visit (INDEPENDENT_AMBULATORY_CARE_PROVIDER_SITE_OTHER): Payer: HMO | Admitting: Nurse Practitioner

## 2021-02-21 ENCOUNTER — Other Ambulatory Visit: Payer: Self-pay

## 2021-02-21 ENCOUNTER — Other Ambulatory Visit: Payer: Self-pay | Admitting: Psychiatry

## 2021-02-21 VITALS — BP 162/80 | HR 101 | Resp 18 | Ht 72.0 in | Wt 274.0 lb

## 2021-02-21 DIAGNOSIS — Z95820 Peripheral vascular angioplasty status with implants and grafts: Secondary | ICD-10-CM | POA: Diagnosis not present

## 2021-02-21 DIAGNOSIS — E785 Hyperlipidemia, unspecified: Secondary | ICD-10-CM | POA: Diagnosis not present

## 2021-02-21 DIAGNOSIS — I96 Gangrene, not elsewhere classified: Secondary | ICD-10-CM | POA: Diagnosis not present

## 2021-02-21 DIAGNOSIS — I70223 Atherosclerosis of native arteries of extremities with rest pain, bilateral legs: Secondary | ICD-10-CM

## 2021-02-21 DIAGNOSIS — I10 Essential (primary) hypertension: Secondary | ICD-10-CM

## 2021-02-21 DIAGNOSIS — F5101 Primary insomnia: Secondary | ICD-10-CM

## 2021-02-21 MED ORDER — AMOXICILLIN-POT CLAVULANATE 875-125 MG PO TABS: 1.0000 | ORAL_TABLET | Freq: Two times a day (BID) | ORAL | 0 refills | Status: DC

## 2021-02-21 MED ORDER — OXYCODONE-ACETAMINOPHEN 10-325 MG PO TABS: 1.0000 | ORAL_TABLET | ORAL | 0 refills | Status: DC | PRN

## 2021-02-22 DIAGNOSIS — I152 Hypertension secondary to endocrine disorders: Secondary | ICD-10-CM | POA: Diagnosis not present

## 2021-02-22 DIAGNOSIS — E1159 Type 2 diabetes mellitus with other circulatory complications: Secondary | ICD-10-CM | POA: Diagnosis not present

## 2021-02-22 DIAGNOSIS — E1029 Type 1 diabetes mellitus with other diabetic kidney complication: Secondary | ICD-10-CM | POA: Diagnosis not present

## 2021-02-22 DIAGNOSIS — R809 Proteinuria, unspecified: Secondary | ICD-10-CM | POA: Diagnosis not present

## 2021-02-27 ENCOUNTER — Encounter (INDEPENDENT_AMBULATORY_CARE_PROVIDER_SITE_OTHER): Payer: Self-pay | Admitting: Nurse Practitioner

## 2021-02-27 NOTE — Progress Notes (Signed)
Subjective:    Patient ID: [name], male    DOB: 04/07/1961, 60 y.o.   MRN: [id number] No chief complaint on file.   [name] is a 60 year old male that returns to the office for followup and review status post angiogram with intervention. The patient notes improvement in the lower extremity symptoms but he continues to have some swelling and aching in his toes.  There is now a new wound in his great toe as well as some changes on his third toe of his left footThe patient denies amaurosis fugax or recent TIA symptoms. There are no recent neurological changes noted. The patient denies history of DVT, PE or superficial thrombophlebitis. The patient denies recent episodes of angina or shortness of breath.   ABI's Rt=1.12 and Lt=0.92  (previous ABI's Rt=[place] and Lt=0.41) Duplex US of the bilateral tibial arteries shows biphasic/monophasic waveforms with slightly dampened toe waveforms bilaterally.   Review of Systems  Cardiovascular:  Positive for leg swelling.  Skin:  Positive for wound.  All other systems reviewed and are negative.     Objective:   Physical Exam Vitals reviewed.  HENT:     Head: Normocephalic.  Cardiovascular:     Rate and Rhythm: Normal rate.     Pulses:          Dorsalis pedis pulses are 1+ on the right side and 1+ on the left side.       Posterior tibial pulses are 1+ on the right side and 1+ on the left side.  Pulmonary:     Effort: Pulmonary effort is normal.  Skin:    General: Skin is warm and dry.     Findings: Wound present.  Neurological:     Mental Status: He is alert and oriented to person, place, and time.  Psychiatric:        Mood and Affect: Mood normal.        Behavior: Behavior normal.        Thought Content: Thought content normal.        Judgment: Judgment normal.    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   Thyroid disease     Social History   Socioeconomic History   Marital status: Married    Spouse name: [name]   Number of children: Not on file   Years of education: Not on file   Highest education level: Not on file  Occupational History   Not on file  Tobacco Use   Smoking status: Former    Packs/day: 0.00    Types: Cigarettes    Quit date: 04/09/2013    Years since quitting: 7.8   Smokeless tobacco: Never  Vaping Use   Vaping Use: Never used  Substance and Sexual Activity   Alcohol use: Yes    Alcohol/week:  2.0 standard drinks    Types: 2 Shots of liquor per week    Comment: rare   Drug use: No    Comment: Pt denied; UDS not available   Sexual activity: Yes    Partners: Female    Birth control/protection: Condom  Other Topics Concern   Not on file  Social History Narrative   Not on file   Social Determinants of Health   Financial Resource Strain: Not on file  Food Insecurity: Not on file  Transportation Needs: Not on file  Physical Activity: Not on file  Stress: Not on file  Social Connections: Not on file  Intimate Partner Violence: Not on file    Past Surgical History:  Procedure Laterality Date   COLONOSCOPY WITH PROPOFOL N/A 05/15/2018   Procedure: COLONOSCOPY WITH PROPOFOL;  Surgeon: [name], [name], MD;  Location: [facility] END[facility]COPY;  Service: Gastroenterology;  Laterality: N/A;   ESOPHAGOGASTRODUODENOSCOPY N/A 05/15/2018   Procedure: ESOPHAGOGASTRODUODENOSCOPY (EGD);  Surgeon: [name], [name], MD;  Location: [facility] ENDOSCOPY;  Service: Gastroenterology;  Laterality: N/A;   FRACTURE  SURGERY     Heel bone reconstruction Left    HERNIA REPAIR  16/1096   Umbilical hernia repair    LARYNGECTOMY     LOWER EXTREMITY ANGIOGRAPHY Left 01/31/2021   Procedure: LOWER EXTREMITY ANGIOGRAPHY;  Surgeon: Algernon Huxley, MD;  Location: [place] [facility];  Service: Cardiovascular;  Laterality: Left;   LOWER EXTREMITY ANGIOGRAPHY Right 02/14/2021   Procedure: LOWER EXTREMITY ANGIOGRAPHY;  Surgeon: Algernon Huxley, MD;  Location: [place] [facility];  Service: Cardiovascular;  Laterality: Right;   NECK SURGERY     fusion   SPINE SURGERY     TRACHEOSTOMY  2014   from [place] attempt    Family History  Problem Relation Age of Onset   Osteoporosis Mother    Diabetes Mother    Hypertension Father    Mental illness Neg Hx     Allergies  Allergen Reactions   Buspar [Buspirone]     Makes the patient "flip out"   Depakote [Valproic Acid]     Causes excessive drowsiness   Clopidogrel Rash   Gabapentin Itching and Rash    CBC Latest Ref Rng & Units 02/15/2021 02/14/2021 02/14/2021  WBC 4.0 - 10.5 K/uL 10.8(H) 10.4 8.5  Hemoglobin 13.0 - 17.0 g/dL 13.8 13.7 13.9  Hematocrit 39.0 - 52.0 % 41.6 41.7 42.4  Platelets 150 - 400 K/uL 412(H) 405(H) 396      CMP     Component Value Date/Time   NA 139 02/14/2021 1731   NA 140 02/20/2014 0806   K 3.9 02/14/2021 1731   K 4.5 02/20/2014 0806   CL 103 02/14/2021 1731   CL 107 02/20/2014 0806   CO2 25 02/14/2021 1731   CO2 28 02/20/2014 0806   GLUCOSE 117 (H) 02/14/2021 1731   GLUCOSE 201 (H) 02/20/2014 0806   BUN 37 (H) 02/14/2021 1731   BUN 18 02/20/2014 0806   CREATININE 1.47 (H) 02/14/2021 1731   CREATININE 1.26 02/20/2014 0806   CALCIUM 9.4 02/14/2021 1731   CALCIUM 8.7 02/20/2014 0806   PROT 7.1 02/14/2021 1731   PROT 6.9 02/17/2014 1106   ALBUMIN 3.3 (L) 02/14/2021 1731   ALBUMIN 3.4 02/17/2014 1106   AST 15 02/14/2021 1731   AST 20 02/17/2014 1106   ALT 16 02/14/2021 1731   ALT 25 02/17/2014 1106   ALKPHOS 117  02/14/2021 1731   ALKPHOS 115 02/17/2014 1106   BILITOT  0.5 02/14/2021 1731   BILITOT 0.3 02/17/2014 1106   GFRNONAA 55 (L) 02/14/2021 1731   GFRNONAA >60 02/20/2014 0806   GFRNONAA >60 07/14/2013 0959   GFRAA 48 (L) 01/22/2019 1431   GFRAA >60 02/20/2014 0806   GFRAA >60 07/14/2013 0959     VAS [place] ABI WITH/WO TBI  Result Date: 02/22/2021  LOWER EXTREMITY DOPPLER STUDY Patient Name:  [name]  Date of Exam:   02/21/2021 Medical Rec #: [id number]      Accession #:    [id number] Date of Birth: 28-Oct-1960      Patient Gender: M Patient Age:   35 years Exam Location:  [facility] Procedure:      VAS [place] ABI WITH/WO TBI Referring Phys: --------------------------------------------------------------------------------  Indications: Peripheral artery disease.  Vascular Interventions: 01/31/2021 Left thrombectomy and SFA-Pop stents. Comparison Study: 01/25/2021 Performing Technologist: Concha Norway RVT  Examination Guidelines: A complete evaluation includes at minimum, Doppler waveform signals and systolic blood pressure reading at the level of bilateral brachial, anterior tibial, and posterior tibial arteries, when vessel segments are accessible. Bilateral testing is considered an integral part of a complete examination. Photoelectric Plethysmograph (PPG) waveforms and toe systolic pressure readings are included as required and additional duplex testing as needed. Limited examinations for reoccurring indications may be performed as noted.  ABI Findings: +---------+------------------+-----+----------+--------+ Right    Rt Pressure (mmHg)IndexWaveform  Comment  +---------+------------------+-----+----------+--------+ Brachial 173                                       +---------+------------------+-----+----------+--------+ ATA      194                    biphasic  1.12     +---------+------------------+-----+----------+--------+ PTA      122               0.71 monophasic          +---------+------------------+-----+----------+--------+ Great Toe120               0.69 Abnormal           +---------+------------------+-----+----------+--------+ +---------+------------------+-----+----------+------------------+ Left     Lt Pressure (mmHg)IndexWaveform  Comment            +---------+------------------+-----+----------+------------------+ ATA      123                    monophasic.71                +---------+------------------+-----+----------+------------------+ PTA      160               0.92 biphasic                     +---------+------------------+-----+----------+------------------+ Great Toe                       Abnormal  Wound on great toe +---------+------------------+-----+----------+------------------+ +-------+-----------+-----------+------------+------------+ ABI/TBIToday's ABIToday's TBIPrevious ABIPrevious TBI +-------+-----------+-----------+------------+------------+ Right  1.12       .69        NonComp     .32          +-------+-----------+-----------+------------+------------+ Left   .92        wound      .41         absent       +-------+-----------+-----------+------------+------------+  Left ABIs appear increased  compared to prior study on 02/05/2021.  Summary: Right: Resting right ankle-brachial index is within normal range. No evidence of significant right lower extremity arterial disease. The right toe-brachial index is normal. Left: Resting left ankle-brachial index indicates mild left lower extremity arterial disease. The left toe-brachial index is abnormal. Left great toe has wound. No pressure done. Waveform seen but is abnormal.  *See table(s) above for measurements and observations.  Electronically signed by [name] MD on 02/22/2021 at 9:16:00 AM.    Final    VAS [place] ABI WITH/WO TBI  Result Date: 01/25/2021  LOWER EXTREMITY DOPPLER STUDY Patient Name:  [name]  Date of Exam:   01/25/2021 Medical Rec  #: [id number]           Accession #:    [id number] Date of Birth: 15-Nov-1960           Patient Gender: M Patient Age:   22 years Exam Location:  [facility] Procedure:      VAS [place] ABI WITH/WO TBI Referring Phys: Eulogio Ditch --------------------------------------------------------------------------------  Indications: Peripheral artery disease.  Performing Technologist: Almira Coaster RVS  Examination Guidelines: A complete evaluation includes at minimum, Doppler waveform signals and systolic blood pressure reading at the level of bilateral brachial, anterior tibial, and posterior tibial arteries, when vessel segments are accessible. Bilateral testing is considered an integral part of a complete examination. Photoelectric Plethysmograph (PPG) waveforms and toe systolic pressure readings are included as required and additional duplex testing as needed. Limited examinations for reoccurring indications may be performed as noted.  ABI Findings: +---------+------------------+-----+----------+--------+ Right    Rt Pressure (mmHg)IndexWaveform  Comment  +---------+------------------+-----+----------+--------+ Brachial 179                                       +---------+------------------+-----+----------+--------+ ATA      250                    monophasicNC       +---------+------------------+-----+----------+--------+ PTA      113               0.63 monophasic         +---------+------------------+-----+----------+--------+ Great Toe57                0.32 Abnormal           +---------+------------------+-----+----------+--------+ +---------+------------------+-----+----------+-------+ Left     Lt Pressure (mmHg)IndexWaveform  Comment +---------+------------------+-----+----------+-------+ Brachial 177                                      +---------+------------------+-----+----------+-------+ ATA      73                     monophasic.41      +---------+------------------+-----+----------+-------+ PTA      65                0.36 monophasic        +---------+------------------+-----+----------+-------+ Great Toe0                 0.00 Absent            +---------+------------------+-----+----------+-------+ +-------+-----------+-----------+------------+------------+ ABI/TBIToday's ABIToday's TBIPrevious ABIPrevious TBI +-------+-----------+-----------+------------+------------+ Right  >1.0 [place]    .32                                 +-------+-----------+-----------+------------+------------+  Left   .41        0.0                                 +-------+-----------+-----------+------------+------------+  Summary: Right: Resting right ankle-brachial index indicates noncompressible right lower extremity arteries. The right toe-brachial index is abnormal. Left: Resting left ankle-brachial index indicates severe left lower extremity arterial disease. The left toe-brachial index is abnormal.  *See table(s) above for measurements and observations.  Electronically signed by [name] MD on 01/25/2021 at 4:41:49 PM.    Final        Assessment & Plan:   1. Rest pain of both lower extremities due to atherosclerosis [facility]) Recommend:  The patient is status post successful angiogram with intervention.  The patient reports that the claudication symptoms and leg pain is essentially gone.   The patient denies lifestyle limiting changes at this point in time.  No further invasive studies, angiography or surgery at this time The patient should continue walking and begin a more formal exercise program.  The patient should continue antiplatelet therapy and aggressive treatment of the lipid abnormalities   Patient should undergo noninvasive studies as ordered. The patient will follow up with me after the studies.  We will have the patient return in 2 weeks to check the status of wounds. - oxyCODONE-acetaminophen (PERCOCET) 10-325 MG  tablet; Take 1 tablet by mouth every 4 (four) hours as needed for pain.  Dispense: 30 tablet; Refill: 0  2. Gangrene ([place]) The patient has evidence of gangrenous changes of his first toe as well as some beginning on his third digit.  We have instructed the patient to keep the wound clean and to utilize Aquacel on it to be changed every other day however we will place an urgent referral to podiatry for evaluation and treatment. - Ambulatory referral to Podiatry  3. Hyperlipidemia, unspecified hyperlipidemia type Continue statin as ordered and reviewed, no changes at this time   4. Primary hypertension Continue antihypertensive medications as already ordered, these medications have been reviewed and there are no changes at this time.    Current Outpatient Medications on File Prior to Visit  Medication Sig Dispense Refill   amLODipine (NORVASC) 5 MG tablet Take 1 tablet (5 mg total) by mouth daily. 30 tablet 1   amLODipine (NORVASC) 5 MG tablet Take 1 tablet by mouth daily.     apixaban (ELIQUIS) 5 MG TABS tablet Take 1 tablet (5 mg total) by mouth 2 (two) times daily. 60 tablet 5   aspirin EC 81 MG tablet Take 1 tablet (81 mg total) by mouth daily. 150 tablet 2   buPROPion (WELLBUTRIN XL) 300 MG 24 hr tablet Take 1 tablet (300 mg total) by mouth daily. 90 tablet 1   clonazePAM (KLONOPIN) 1 MG tablet TAKE 1/2 TO 1 TABLET(0.5 TO 1 MG) BY MOUTH DAILY AS NEEDED FOR ANXIETY OR SEVERE ANXIETY 30 tablet 2   empagliflozin (JARDIANCE) 25 MG TABS tablet Take 25 mg by mouth daily. 30 tablet 1   glycopyrrolate (ROBINUL) 1 MG tablet TAKE 1 TABLET(1 MG) BY MOUTH TWICE DAILY     insulin aspart (NOVOLOG) 100 UNIT/ML injection INJECT UP TO 120 UNITS UNDER THE SKIN AS DIRECTED DAILY VIA INSULIN PUMP     levothyroxine (SYNTHROID) 75 MCG tablet TAKE 1 TABLET BY MOUTH DAILY AT 6 AM 90 tablet 0   losartan-hydrochlorothiazide (HYZAAR) 100-25 MG tablet TAKE 1 TABLET  BY MOUTH EVERY DAY     OLANZapine (ZYPREXA) 15 MG  tablet TAKE 1 TABLET(15 MG) BY MOUTH AT BEDTIME 30 tablet 1   pantoprazole (PROTONIX) 40 MG tablet TAKE 1 TABLET(40 MG) BY MOUTH TWICE DAILY     rosuvastatin (CRESTOR) 20 MG tablet Take 20 mg by mouth at bedtime.     traMADol (ULTRAM) 50 MG tablet Take 50 mg by mouth every 6 (six) hours as needed.     venlafaxine XR (EFFEXOR-XR) 150 MG 24 hr capsule TAKE 1 CAPSULE(150 MG) BY MOUTH DAILY WITH BREAKFAST 30 capsule 1   chlorhexidine (PERIDEX) 0.12 % solution 2 (two) times daily. (Patient not taking: No sig reported)     FLUCELVAX QUADRIVALENT 0.5 ML injection  (Patient not taking: Reported on 02/21/2021)     glycopyrrolate (ROBINUL) 1 MG tablet Take by mouth.     No current facility-administered medications on file prior to visit.    There are no Patient Instructions on file for this visit. Return in about 2 weeks (around 03/07/2021) for wound check .   Kris Hartmann, NP

## 2021-02-28 DIAGNOSIS — E1029 Type 1 diabetes mellitus with other diabetic kidney complication: Secondary | ICD-10-CM | POA: Diagnosis not present

## 2021-02-28 DIAGNOSIS — R809 Proteinuria, unspecified: Secondary | ICD-10-CM | POA: Diagnosis not present

## 2021-03-01 DIAGNOSIS — I739 Peripheral vascular disease, unspecified: Secondary | ICD-10-CM | POA: Diagnosis not present

## 2021-03-01 DIAGNOSIS — I96 Gangrene, not elsewhere classified: Secondary | ICD-10-CM | POA: Diagnosis not present

## 2021-03-01 DIAGNOSIS — L97521 Non-pressure chronic ulcer of other part of left foot limited to breakdown of skin: Secondary | ICD-10-CM | POA: Diagnosis not present

## 2021-03-01 DIAGNOSIS — E1042 Type 1 diabetes mellitus with diabetic polyneuropathy: Secondary | ICD-10-CM | POA: Diagnosis not present

## 2021-03-01 DIAGNOSIS — B351 Tinea unguium: Secondary | ICD-10-CM | POA: Diagnosis not present

## 2021-03-02 ENCOUNTER — Encounter (INDEPENDENT_AMBULATORY_CARE_PROVIDER_SITE_OTHER): Payer: HMO

## 2021-03-02 ENCOUNTER — Ambulatory Visit (INDEPENDENT_AMBULATORY_CARE_PROVIDER_SITE_OTHER): Payer: HMO | Admitting: Nurse Practitioner

## 2021-03-07 DIAGNOSIS — Z23 Encounter for immunization: Secondary | ICD-10-CM | POA: Diagnosis not present

## 2021-03-07 DIAGNOSIS — Z79899 Other long term (current) drug therapy: Secondary | ICD-10-CM | POA: Diagnosis not present

## 2021-03-07 DIAGNOSIS — E039 Hypothyroidism, unspecified: Secondary | ICD-10-CM | POA: Diagnosis not present

## 2021-03-07 DIAGNOSIS — N1832 Chronic kidney disease, stage 3b: Secondary | ICD-10-CM | POA: Diagnosis not present

## 2021-03-07 DIAGNOSIS — Z930 Tracheostomy status: Secondary | ICD-10-CM | POA: Diagnosis not present

## 2021-03-07 DIAGNOSIS — I739 Peripheral vascular disease, unspecified: Secondary | ICD-10-CM | POA: Diagnosis not present

## 2021-03-07 DIAGNOSIS — I152 Hypertension secondary to endocrine disorders: Secondary | ICD-10-CM | POA: Diagnosis not present

## 2021-03-07 DIAGNOSIS — E1159 Type 2 diabetes mellitus with other circulatory complications: Secondary | ICD-10-CM | POA: Diagnosis not present

## 2021-03-07 DIAGNOSIS — F331 Major depressive disorder, recurrent, moderate: Secondary | ICD-10-CM | POA: Diagnosis not present

## 2021-03-07 DIAGNOSIS — Z125 Encounter for screening for malignant neoplasm of prostate: Secondary | ICD-10-CM | POA: Diagnosis not present

## 2021-03-07 DIAGNOSIS — E7800 Pure hypercholesterolemia, unspecified: Secondary | ICD-10-CM | POA: Diagnosis not present

## 2021-03-07 DIAGNOSIS — E1022 Type 1 diabetes mellitus with diabetic chronic kidney disease: Secondary | ICD-10-CM | POA: Diagnosis not present

## 2021-03-08 ENCOUNTER — Ambulatory Visit (INDEPENDENT_AMBULATORY_CARE_PROVIDER_SITE_OTHER): Payer: HMO | Admitting: Vascular Surgery

## 2021-03-08 ENCOUNTER — Other Ambulatory Visit: Payer: Self-pay

## 2021-03-08 ENCOUNTER — Encounter (INDEPENDENT_AMBULATORY_CARE_PROVIDER_SITE_OTHER): Payer: Self-pay | Admitting: Vascular Surgery

## 2021-03-08 VITALS — BP 136/74 | HR 109 | Ht 72.0 in | Wt 266.0 lb

## 2021-03-08 DIAGNOSIS — I70269 Atherosclerosis of native arteries of extremities with gangrene, unspecified extremity: Secondary | ICD-10-CM | POA: Insufficient documentation

## 2021-03-08 DIAGNOSIS — I70262 Atherosclerosis of native arteries of extremities with gangrene, left leg: Secondary | ICD-10-CM

## 2021-03-08 DIAGNOSIS — E1052 Type 1 diabetes mellitus with diabetic peripheral angiopathy with gangrene: Secondary | ICD-10-CM

## 2021-03-08 DIAGNOSIS — E785 Hyperlipidemia, unspecified: Secondary | ICD-10-CM | POA: Diagnosis not present

## 2021-03-08 DIAGNOSIS — I10 Essential (primary) hypertension: Secondary | ICD-10-CM

## 2021-03-08 NOTE — Progress Notes (Signed)
MRN : [id number]  [name] is a 59 y.o. (1961/04/30) male who presents with chief complaint of  Chief Complaint  Patient presents with   Follow-up    2 wk wound check  .  History of Present Illness: Patient returns today in follow up of his PAD.  He had revascularization done about a month ago.  He has gangrenous changes to the tips of the first and third toes.  He has seen podiatry since his last visit and they are managing this conservatively initially.  Those 2 toes are painful.  His claudication symptoms are resolved.  His perfusion was normal on check a few weeks ago.  Current Outpatient Medications  Medication Sig Dispense Refill   amLODipine (NORVASC) 5 MG tablet Take 1 tablet (5 mg total) by mouth daily. 30 tablet 1   amLODipine (NORVASC) 5 MG tablet Take 1 tablet by mouth daily.     amoxicillin-clavulanate (AUGMENTIN) 875-125 MG tablet Take 1 tablet by mouth 2 (two) times daily. 20 tablet 0   amoxicillin-clavulanate (AUGMENTIN) 875-125 MG tablet Take by mouth.     apixaban (ELIQUIS) 5 MG TABS tablet Take 1 tablet (5 mg total) by mouth 2 (two) times daily. 60 tablet 5   aspirin EC 81 MG tablet Take 1 tablet (81 mg total) by mouth daily. 150 tablet 2   buPROPion (WELLBUTRIN XL) 300 MG 24 hr tablet Take 1 tablet (300 mg total) by mouth daily. 90 tablet 1   chlorhexidine (PERIDEX) 0.12 % solution 2 (two) times daily.     clonazePAM (KLONOPIN) 1 MG tablet TAKE 1/2 TO 1 TABLET(0.5 TO 1 MG) BY MOUTH DAILY AS NEEDED FOR ANXIETY OR SEVERE ANXIETY 30 tablet 2   empagliflozin (JARDIANCE) 25 MG TABS tablet Take 25 mg by mouth daily. 30 tablet 1   FLUCELVAX QUADRIVALENT 0.5 ML injection      glycopyrrolate (ROBINUL) 1 MG tablet TAKE 1 TABLET(1 MG) BY MOUTH TWICE DAILY     insulin aspart (NOVOLOG) 100 UNIT/ML injection INJECT UP TO 120 UNITS UNDER THE SKIN AS DIRECTED DAILY VIA INSULIN PUMP     levothyroxine (SYNTHROID) 75 MCG tablet TAKE 1 TABLET BY MOUTH DAILY AT 6 AM 90 tablet 0    losartan-hydrochlorothiazide (HYZAAR) 100-25 MG tablet TAKE 1 TABLET BY MOUTH EVERY DAY     OLANZapine (ZYPREXA) 15 MG tablet TAKE 1 TABLET(15 MG) BY MOUTH AT BEDTIME 30 tablet 1   oxyCODONE-acetaminophen (PERCOCET) 10-325 MG tablet Take 1 tablet by mouth every 4 (four) hours as needed for pain. 30 tablet 0   pantoprazole (PROTONIX) 40 MG tablet TAKE 1 TABLET(40 MG) BY MOUTH TWICE DAILY     rosuvastatin (CRESTOR) 20 MG tablet Take 20 mg by mouth at bedtime.     traMADol (ULTRAM) 50 MG tablet Take 50 mg by mouth every 6 (six) hours as needed.     venlafaxine XR (EFFEXOR-XR) 150 MG 24 hr capsule TAKE 1 CAPSULE(150 MG) BY MOUTH DAILY WITH BREAKFAST 30 capsule 1   zolpidem (AMBIEN) 10 MG tablet TAKE 1 TABLET(10 MG) BY MOUTH AT BEDTIME AS NEEDED FOR SLEEP 30 tablet 1   glycopyrrolate (ROBINUL) 1 MG tablet Take by mouth.     No current facility-administered medications for this visit.    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   Thyroid disease     Past Surgical History:  Procedure Laterality Date   COLONOSCOPY WITH PROPOFOL N/A 05/15/2018   Procedure: COLONOSCOPY WITH PROPOFOL;  Surgeon: [name], [name], MD;  Location: [facility] ENDOSCOPY;  Service: Gastroenterology;  Laterality: N/A;   ESOPHAGOGASTRODUODENOSCOPY N/A 05/15/2018   Procedure: ESOPHAGOGASTRODUODENOSCOPY (EGD);  Surgeon: [name], [name], MD;  Location: [facility] ENDOSCOPY;  Service: Gastroenterology;  Laterality: N/A;   FRACTURE SURGERY     Heel bone reconstruction Left    HERNIA REPAIR  17/5102   Umbilical hernia repair    LARYNGECTOMY     LOWER EXTREMITY ANGIOGRAPHY  Left 01/31/2021   Procedure: LOWER EXTREMITY ANGIOGRAPHY;  Surgeon: Algernon Huxley, MD;  Location: [place] [facility];  Service: Cardiovascular;  Laterality: Left;   LOWER EXTREMITY ANGIOGRAPHY Right 02/14/2021   Procedure: LOWER EXTREMITY ANGIOGRAPHY;  Surgeon: Algernon Huxley, MD;  Location: [place] [facility];  Service: Cardiovascular;  Laterality: Right;   NECK SURGERY     fusion   [place]  2014   from [place] attempt     Social History   Tobacco Use   Smoking status: Former    Packs/day: 0.00    Types: Cigarettes    Quit date: 04/09/2013    Years since quitting: 7.9   Smokeless tobacco: Never  Vaping Use   Vaping Use: Never used  Substance Use Topics   Alcohol use: Yes    Alcohol/week: 2.0 standard drinks    Types: 2 Shots of liquor per week    Comment: rare   Drug use: No    Comment: Pt denied; UDS not available      Family History  Problem Relation Age of Onset   Osteoporosis Mother    Diabetes Mother    Hypertension Father    Mental illness Neg Hx      Allergies  Allergen Reactions   Buspar [Buspirone]     Makes the patient "flip out"   Depakote [Valproic Acid]     Causes excessive drowsiness   Clopidogrel Rash   Gabapentin Itching and Rash     REVIEW OF SYSTEMS (Negative unless checked)  Constitutional: [] Weight loss  [] Fever  [] Chills Cardiac: [] Chest pain   [] Chest pressure   [] Palpitations   [] Shortness of breath when laying flat   [] Shortness of breath at rest   [] Shortness of breath with exertion. Vascular:  [x] Pain in legs with walking   [] Pain in legs at rest   [] Pain in legs when laying flat   [] Claudication   [] Pain in feet when walking  [] Pain in feet at rest  [] Pain in feet when laying flat   [] History of DVT   [] Phlebitis   [x] Swelling in legs   [] Varicose veins   [x] Non-healing ulcers Pulmonary:   [] Uses home oxygen   [] Productive cough   [] Hemoptysis   [] Wheeze  [] COPD   [] Asthma Neurologic:  [] Dizziness  [] Blackouts    [] Seizures   [] History of stroke   [] History of TIA  [] Aphasia   [] Temporary blindness   [] Dysphagia   [] Weakness or numbness in arms   [] Weakness or numbness in legs Musculoskeletal:  [] Arthritis   [] Joint swelling   [] Joint  pain   [] Low back pain Hematologic:  [] Easy bruising  [] Easy bleeding   [] Hypercoagulable state   [] Anemic   Gastrointestinal:  [] Blood in stool   [] Vomiting blood  [] Gastroesophageal reflux/heartburn   [] Abdominal pain Genitourinary:  [] Chronic kidney disease   [] Difficult urination  [] Frequent urination  [] Burning with urination   [] Hematuria Skin:  [] Rashes   [x] Ulcers   [x] Wounds Psychological:  [] History of anxiety   []  History of major depression.  Physical Examination  BP 136/74   Pulse (!) 109   Ht 6' (1.829 m)   Wt 266 lb (120.7 kg)   BMI 36.08 kg/m  Gen:  WD/WN, NAD Head: [place]/AT, No temporalis wasting. Ear/Nose/Throat: Hearing grossly intact, nares w/o erythema or drainage Eyes: Conjunctiva clear. Sclera non-icteric Neck: Supple.  Tracheostomy in place Pulmonary:  Good air movement, no use of accessory muscles.  Cardiac: RRR, no JVD Vascular:  Vessel Right Left  Radial Palpable Palpable                          PT Palpable Palpable  DP Palpable Palpable   Gastrointestinal: soft, non-tender/non-distended. No guarding/reflex.  Musculoskeletal: M/S 5/5 throughout.  Gangrenous changes on the top and distal aspect of the left great toe and on the tip of the left third toe.  Good capillary refill other than the skin wounds.  Trace lower extremity edema Neurologic: Sensation grossly intact in extremities.  Symmetrical.   Psychiatric: Judgment intact, Mood & affect appropriate for pt's clinical situation. Dermatologic: No rashes or ulcers noted.  No cellulitis or open wounds.      Labs Recent Results (from the past 2160 hour(s))  BUN     Status: Abnormal   Collection Time: 01/31/21  9:50 AM  Result Value Ref Range   BUN 35 (H) 6 - 20 mg/dL     Comment: Performed at [facility], [address] [place], [place] [address]  Creatinine, serum     Status: Abnormal   Collection Time: 01/31/21  9:50 AM  Result Value Ref Range   Creatinine, Ser 1.54 (H) 0.61 - 1.24 mg/dL   GFR, Estimated 52 (L) >60 mL/min    Comment: (NOTE) Calculated using the CKD-EPI Creatinine Equation (2021) Performed at [facility], [address] [place], [place] [address]   Glucose, capillary     Status: Abnormal   Collection Time: 01/31/21  9:51 AM  Result Value Ref Range   Glucose-Capillary 236 (H) 70 - 99 mg/dL    Comment: Glucose reference range applies only to samples taken after fasting for at least 8 hours.  Glucose, capillary     Status: Abnormal   Collection Time: 01/31/21 12:11 PM  Result Value Ref Range   Glucose-Capillary 139 (H) 70 - 99 mg/dL    Comment: Glucose reference range applies only to samples taken after fasting for at least 8 hours.  BUN     Status: Abnormal   Collection Time: 02/14/21  1:14 PM  Result Value Ref Range   BUN 39 (H) 6 - 20 mg/dL    Comment: Performed at [facility], [address] [place], [place] [address]  Creatinine, serum     Status: Abnormal   Collection Time: 02/14/21  1:14 PM  Result Value Ref Range   Creatinine, Ser 1.59 (H) 0.61 - 1.24 mg/dL   GFR, Estimated 50 (L) >60 mL/min    Comment: (NOTE) Calculated using the CKD-EPI Creatinine Equation (2021) Performed at [facility], [address] [place], [place] [address]   Hemoglobin A1c     Status: Abnormal   Collection Time: 02/14/21  5:31 PM  Result Value Ref Range   Hgb A1c MFr Bld 7.8 (H) 4.8 - 5.6 %    Comment: (NOTE)         Prediabetes: 5.7 - 6.4         Diabetes: >6.4         Glycemic control for adults with diabetes: <7.0    Mean Plasma Glucose 177 mg/dL    Comment: (NOTE) Performed At: [facility], [place] [id number] Rush Farmer MD WC:[id number]   HIV  Antibody (routine testing w rflx)     Status: None   Collection Time: 02/14/21  5:31 PM  Result Value Ref Range   HIV Screen 4th Generation wRfx Non Reactive Non Reactive    Comment: Performed at [facility], [place] [address] [place], [place] [address]  CBC     Status: None   Collection Time: 02/14/21  5:31 PM  Result Value Ref Range   WBC 8.5 4.0 - 10.5 K/uL   RBC 4.61 4.22 - 5.81 MIL/uL   Hemoglobin 13.9 13.0 - 17.0 g/dL   HCT 42.4 39.0 - 52.0 %   MCV 92.0 80.0 - 100.0 fL   MCH 30.2 26.0 - 34.0 pg   MCHC 32.8 30.0 - 36.0 g/dL   RDW 14.2 11.5 - 15.5 %   Platelets 396 150 - 400 K/uL   nRBC 0.0 0.0 - 0.2 %    Comment: Performed at [facility], [address] [place], [place] [address]  Comprehensive metabolic panel     Status: Abnormal   Collection Time: 02/14/21  5:31 PM  Result Value Ref Range   Sodium 139 135 - 145 mmol/L   Potassium 3.9 3.5 - 5.1 mmol/L   Chloride 103 98 - 111 mmol/L   CO2 25 22 - 32 mmol/L   Glucose, Bld 117 (H) 70 - 99 mg/dL    Comment: Glucose reference range applies only to samples taken after fasting for at least 8 hours.   BUN 37 (H) 6 - 20 mg/dL   Creatinine, Ser 1.47 (H) 0.61 - 1.24 mg/dL   Calcium 9.4 8.9 - 10.3 mg/dL   Total Protein 7.1 6.5 - 8.1 g/dL   Albumin 3.3 (L) 3.5 - 5.0 g/dL   AST 15 15 - 41 U/L   ALT 16 0 - 44 U/L   Alkaline Phosphatase 117 38 - 126 U/L   Total Bilirubin 0.5 0.3 - 1.2 mg/dL   GFR, Estimated 55 (L) >60 mL/min    Comment: (NOTE) Calculated using the CKD-EPI Creatinine Equation (2021)    Anion gap 11 5 - 15    Comment: Performed at [facility], [address] [place], [place] [address]  Protime-INR     Status: None   Collection Time: 02/14/21  5:31 PM  Result Value Ref Range   Prothrombin Time 14.5 11.4 - 15.2 seconds   INR 1.1 0.8 - 1.2    Comment: (NOTE) INR goal varies based on device and disease states. Performed at [facility], [address] [place],   [address]   CBC     Status: Abnormal   Collection Time: 02/14/21 10:32 PM  Result Value Ref Range   WBC 10.4 4.0 - 10.5 K/uL   RBC 4.49 4.22 - 5.81 MIL/uL   Hemoglobin 13.7  13.0 - 17.0 g/dL   HCT 41.7 39.0 - 52.0 %   MCV 92.9 80.0 - 100.0 fL   MCH 30.5 26.0 - 34.0 pg   MCHC 32.9 30.0 - 36.0 g/dL   RDW 14.3 11.5 - 15.5 %   Platelets 405 (H) 150 - 400 K/uL   nRBC 0.0 0.0 - 0.2 %    Comment: Performed at [facility], [address] [place], [place] [address]  CBC     Status: Abnormal   Collection Time: 02/15/21  5:00 AM  Result Value Ref Range   WBC 10.8 (H) 4.0 - 10.5 K/uL   RBC 4.45 4.22 - 5.81 MIL/uL   Hemoglobin 13.8 13.0 - 17.0 g/dL   HCT 41.6 39.0 - 52.0 %   MCV 93.5 80.0 - 100.0 fL   MCH 31.0 26.0 - 34.0 pg   MCHC 33.2 30.0 - 36.0 g/dL   RDW 14.3 11.5 - 15.5 %   Platelets 412 (H) 150 - 400 K/uL   nRBC 0.0 0.0 - 0.2 %    Comment: Performed at [facility], [address], [place], [place] [address]    Radiology PERIPHERAL VASCULAR CATHETERIZATION  Result Date: 02/14/2021 See surgical note for result.  VAS [place] ABI WITH/WO TBI  Result Date: 02/22/2021  LOWER EXTREMITY DOPPLER STUDY Patient Name:  TORREN MAFFEO  Date of Exam:   02/21/2021 Medical Rec #: [id number]      Accession #:    [id number] Date of Birth: 11/17/1960      Patient Gender: M Patient Age:   49 years Exam Location:  [facility] Procedure:      VAS [place] ABI WITH/WO TBI Referring Phys: --------------------------------------------------------------------------------  Indications: Peripheral artery disease.  Vascular Interventions: 01/31/2021 Left thrombectomy and SFA-Pop stents. Comparison Study: 01/25/2021 Performing Technologist: Concha Norway RVT  Examination Guidelines: A complete evaluation includes at minimum, Doppler waveform signals and systolic blood pressure reading at the level of bilateral brachial, anterior tibial, and posterior tibial arteries, when vessel segments are  accessible. Bilateral testing is considered an integral part of a complete examination. Photoelectric Plethysmograph (PPG) waveforms and toe systolic pressure readings are included as required and additional duplex testing as needed. Limited examinations for reoccurring indications may be performed as noted.  ABI Findings: +---------+------------------+-----+----------+--------+ Right    Rt Pressure (mmHg)IndexWaveform  Comment  +---------+------------------+-----+----------+--------+ Brachial 173                                       +---------+------------------+-----+----------+--------+ ATA      194                    biphasic  1.12     +---------+------------------+-----+----------+--------+ PTA      122               0.71 monophasic         +---------+------------------+-----+----------+--------+ Great Toe120               0.69 Abnormal           +---------+------------------+-----+----------+--------+ +---------+------------------+-----+----------+------------------+ Left     Lt Pressure (mmHg)IndexWaveform  Comment            +---------+------------------+-----+----------+------------------+ ATA      123                    monophasic.71                +---------+------------------+-----+----------+------------------+  PTA      160               0.92 biphasic                     +---------+------------------+-----+----------+------------------+ Great Toe                       Abnormal  Wound on great toe +---------+------------------+-----+----------+------------------+ +-------+-----------+-----------+------------+------------+ ABI/TBIToday's ABIToday's TBIPrevious ABIPrevious TBI +-------+-----------+-----------+------------+------------+ Right  1.12       .69        NonComp     .32          +-------+-----------+-----------+------------+------------+ Left   .92        wound      .41         absent        +-------+-----------+-----------+------------+------------+  Left ABIs appear increased compared to prior study on 02/05/2021.  Summary: Right: Resting right ankle-brachial index is within normal range. No evidence of significant right lower extremity arterial disease. The right toe-brachial index is normal. Left: Resting left ankle-brachial index indicates mild left lower extremity arterial disease. The left toe-brachial index is abnormal. Left great toe has wound. No pressure done. Waveform seen but is abnormal.  *See table(s) above for measurements and observations.  Electronically signed by [name] MD on 02/22/2021 at 9:16:00 AM.    Final     Assessment/Plan  Atherosclerosis of native arteries of the extremities with gangrene [facility]) He has gangrenous changes on the first and third toes on the left foot.  He has seen podiatry and is following up with them in a week or 2.  His perfusion was checked a couple of weeks ago and was back to normal.  We will see him back in a few weeks with noninvasive studies to ensure the perfusion is adequate and follow his wounds along with podiatry.  HTN (hypertension) blood pressure control important in reducing the progression of atherosclerotic disease. On appropriate oral medications.   Type 1 diabetes mellitus (HCC) blood glucose control important in reducing the progression of atherosclerotic disease. Also, involved in wound healing. On appropriate medications.   Hyperlipidemia lipid control important in reducing the progression of atherosclerotic disease. Continue statin therapy    [name], MD  03/08/2021 2:23 PM    This note was created with Dragon medical transcription system.  Any errors from dictation are purely unintentional

## 2021-03-08 NOTE — Assessment & Plan Note (Signed)
He has gangrenous changes on the first and third toes on the left foot.  He has seen podiatry and is following up with them in a week or 2.  His perfusion was checked a couple of weeks ago and was back to normal.  We will see him back in a few weeks with noninvasive studies to ensure the perfusion is adequate and follow his wounds along with podiatry.

## 2021-03-08 NOTE — Assessment & Plan Note (Signed)
lipid control important in reducing the progression of atherosclerotic disease. Continue statin therapy  

## 2021-03-08 NOTE — Assessment & Plan Note (Signed)
blood glucose control important in reducing the progression of atherosclerotic disease. Also, involved in wound healing. On appropriate medications.  

## 2021-03-08 NOTE — Assessment & Plan Note (Signed)
blood pressure control important in reducing the progression of atherosclerotic disease. On appropriate oral medications.  

## 2021-03-10 DIAGNOSIS — Z430 Encounter for attention to tracheostomy: Secondary | ICD-10-CM | POA: Diagnosis not present

## 2021-03-17 DIAGNOSIS — I739 Peripheral vascular disease, unspecified: Secondary | ICD-10-CM | POA: Diagnosis not present

## 2021-03-17 DIAGNOSIS — I96 Gangrene, not elsewhere classified: Secondary | ICD-10-CM | POA: Diagnosis not present

## 2021-03-17 DIAGNOSIS — E1042 Type 1 diabetes mellitus with diabetic polyneuropathy: Secondary | ICD-10-CM | POA: Diagnosis not present

## 2021-03-17 DIAGNOSIS — L97522 Non-pressure chronic ulcer of other part of left foot with fat layer exposed: Secondary | ICD-10-CM | POA: Diagnosis not present

## 2021-03-18 ENCOUNTER — Other Ambulatory Visit (INDEPENDENT_AMBULATORY_CARE_PROVIDER_SITE_OTHER): Payer: Self-pay | Admitting: Vascular Surgery

## 2021-03-22 ENCOUNTER — Encounter: Payer: Self-pay | Admitting: Psychiatry

## 2021-03-22 ENCOUNTER — Ambulatory Visit (INDEPENDENT_AMBULATORY_CARE_PROVIDER_SITE_OTHER): Payer: HMO | Admitting: Psychiatry

## 2021-03-22 ENCOUNTER — Other Ambulatory Visit: Payer: Self-pay

## 2021-03-22 VITALS — BP 123/79 | HR 103 | Temp 98.3°F | Wt 276.0 lb

## 2021-03-22 DIAGNOSIS — R635 Abnormal weight gain: Secondary | ICD-10-CM

## 2021-03-22 DIAGNOSIS — F5101 Primary insomnia: Secondary | ICD-10-CM

## 2021-03-22 DIAGNOSIS — T50905A Adverse effect of unspecified drugs, medicaments and biological substances, initial encounter: Secondary | ICD-10-CM

## 2021-03-22 DIAGNOSIS — F331 Major depressive disorder, recurrent, moderate: Secondary | ICD-10-CM

## 2021-03-22 DIAGNOSIS — F411 Generalized anxiety disorder: Secondary | ICD-10-CM

## 2021-03-22 DIAGNOSIS — Z430 Encounter for attention to tracheostomy: Secondary | ICD-10-CM | POA: Diagnosis not present

## 2021-03-22 MED ORDER — OLANZAPINE 15 MG PO TABS: ORAL_TABLET | ORAL | 0 refills | Status: DC

## 2021-03-22 NOTE — Progress Notes (Signed)
[facility] MD OP Progress Note  03/22/2021 3:47 PM [name]  MRN:  [id number]  Chief Complaint:  Chief Complaint   Follow-up    HPI: [name] is a 60 year old Caucasian male, married, lives in [place], has a history of MDD, GAD, insomnia, history of tracheal stenosis status post multiple reintubation, respiratory failure due to opioid overdose, history of CVA, right-sided hemiparesis, diabetes mellitus, hypertension, NSTEMI, hypothyroidism, chronic pain was evaluated in office today.  Patient with peripheral vascular disease, skin ulcer on top of left foot, gangrene of toe, currently under the care of Dr. Cleda Mccreedy , Podiatry, reviewed notes from 03/17/2021, patient with erythematous appearance of the left foot, continued black gangriness changes noted especially distal aspect of the left third toe and new full-thickness ulcerated area on the left fifth toe and lateral aspect of the fourth toe.  Status post debridement.Patient started on collagenase ointment.'  Patient today reports he is currently depressed about the fact that he continues to have gangrene of his left sided foot.  He was told by his provider that he may need amputation if it gets worse.  This is concerning to him and makes him depressed.  Patient reports when he was first told that his foot was getting worse, he went through an episode of depression and hence his primary care provider increased his venlafaxine to 300 mg daily-this was on 03/07/2021 at his last visit.  Patient reports he has noticed excessive sweatiness especially when he gets out of shower since being on the venlafaxine 300 mg.  He is currently coping with it and would like to give the 300 mg more time.  It does help with his depression symptoms.  Patient reports he currently does not have any significant sadness, anhedonia, lack of motivation or suicidality.  Patient reports sleep is overall okay.  Denies any significant anxiety.  Patient reports he looks  forward to Thanksgiving holidays and plans to spend with his family.  Continues to have good support system from his wife and parents.  His step daughter currently is visiting and that is a stressor however so far he has been coping okay.  He continues to follow-up with his therapist on a regular basis.  Patient denies any suicidality, homicidality or perceptual disturbances.  Patient denies any other concerns today.    Visit Diagnosis:    ICD-10-CM   1. MDD (major depressive disorder), recurrent episode, moderate (HCC)  F33.1 OLANZapine (ZYPREXA) 15 MG tablet   improving    2. GAD (generalized anxiety disorder)  F41.1     3. Primary insomnia  F51.01     4. Weight gain due to medication  R63.5    T50.905A    likely due to psychotropics      Past Psychiatric History: Reviewed past psychiatric history from progress note on 01/15/2019.  Past trials of fluoxetine, Rexulti, Xanax, Abilify.  Patient completed TMS-04/05/2020.  Patient with [facility] sessions in the past prior to that.  Patient with history of ECT-did not tolerate it.  Patient with multiple inpatient mental health admission in 2020.  Past suicide attempt-2014.  Past Medical History:  Past Medical History:  Diagnosis Date   Depression    Diabetes ([place])    Insulin Pump   Diabetes mellitus type I ([place])    Diabetes mellitus without complication (HCC)    GERD (gastroesophageal reflux disease)    H/O laryngectomy    Heel bone fracture    Hyperlipidemia    Hypertension    Radicular  pain of right lower extremity    Stroke [facility])    Suicide attempt [facility]) 2014   damaged larynx - tracheostomy   Thyroid disease     Past Surgical History:  Procedure Laterality Date   COLONOSCOPY WITH PROPOFOL N/A 05/15/2018   Procedure: COLONOSCOPY WITH PROPOFOL;  Surgeon: [name], [name], MD;  Location: [facility] ENDOSCOPY;  Service: Gastroenterology;  Laterality: N/A;   ESOPHAGOGASTRODUODENOSCOPY N/A 05/15/2018   Procedure: ESOPHAGOGASTRODUODENOSCOPY  (EGD);  Surgeon: [name], [name], MD;  Location: [facility] ENDOSCOPY;  Service: Gastroenterology;  Laterality: N/A;   FRACTURE SURGERY     Heel bone reconstruction Left    HERNIA REPAIR  [id number]   Umbilical hernia repair    LARYNGECTOMY     LOWER EXTREMITY ANGIOGRAPHY Left 01/31/2021   Procedure: LOWER EXTREMITY ANGIOGRAPHY;  Surgeon: Algernon Huxley, MD;  Location: [facility] CV LAB;  Service: Cardiovascular;  Laterality: Left;   LOWER EXTREMITY ANGIOGRAPHY Right 02/14/2021   Procedure: LOWER EXTREMITY ANGIOGRAPHY;  Surgeon: Algernon Huxley, MD;  Location: [place] CV LAB;  Service: Cardiovascular;  Laterality: Right;   NECK SURGERY     fusion   SPINE SURGERY     TRACHEOSTOMY  2014   from [place] attempt    Family Psychiatric History: Reviewed past psychiatric history from progress note on 01/15/2019  Family History:  Family History  Problem Relation Age of Onset   Osteoporosis Mother    Diabetes Mother    Hypertension Father    Mental illness Neg Hx     Social History: Reviewed social history from progress note on 01/15/2019 Social History   Socioeconomic History   Marital status: Married    Spouse name: [name]   Number of children: Not on file   Years of education: Not on file   Highest education level: Not on file  Occupational History   Not on file  Tobacco Use   Smoking status: Former    Packs/day: 0.00    Types: Cigarettes    Quit date: 04/09/2013    Years since quitting: 7.9   Smokeless tobacco: Never  Vaping Use   Vaping Use: Never used  Substance and Sexual Activity   Alcohol use: Yes    Alcohol/week: 2.0 standard drinks    Types: 2 Shots of liquor per week    Comment: rare   Drug use: No    Comment: Pt denied; UDS not available   Sexual activity: Yes    Partners: Female    Birth control/protection: Condom  Other Topics Concern   Not on file  Social History Narrative   Not on file   Social Determinants of Health   Financial Resource Strain: Not on file   Food Insecurity: Not on file  Transportation Needs: Not on file  Physical Activity: Not on file  Stress: Not on file  Social Connections: Not on file    Allergies:  Allergies  Allergen Reactions   Buspar [Buspirone]     Makes the patient "flip out"   Depakote [Valproic Acid]     Causes excessive drowsiness   Clopidogrel Rash   Gabapentin Itching and Rash    Metabolic Disorder Labs: Lab Results  Component Value Date   HGBA1C 7.8 (H) 02/14/2021   MPG 177 02/14/2021   MPG 177.16 01/01/2019   No results found for: PROLACTIN Lab Results  Component Value Date   CHOL 161 09/15/2016   TRIG 190 (H) 09/15/2016   HDL 41 09/15/2016   CHOLHDL 3.9 09/15/2016   VLDL  38 09/15/2016   LDLCALC 82 09/15/2016   Lab Results  Component Value Date   TSH 1.952 09/15/2016    Therapeutic Level Labs: No results found for: LITHIUM Lab Results  Component Value Date   VALPROATE 19 (L) 09/12/2016   No components found for:  CBMZ  Current Medications: Current Outpatient Medications  Medication Sig Dispense Refill   amLODipine (NORVASC) 5 MG tablet Take 1 tablet (5 mg total) by mouth daily. 30 tablet 1   amoxicillin-clavulanate (AUGMENTIN) 875-125 MG tablet Take 1 tablet by mouth 2 (two) times daily. 20 tablet 0   apixaban (ELIQUIS) 5 MG TABS tablet Take 1 tablet (5 mg total) by mouth 2 (two) times daily. 60 tablet 5   aspirin EC 81 MG tablet Take 1 tablet (81 mg total) by mouth daily. 150 tablet 2   buPROPion (WELLBUTRIN XL) 300 MG 24 hr tablet Take 1 tablet (300 mg total) by mouth daily. 90 tablet 1   chlorhexidine (PERIDEX) 0.12 % solution 2 (two) times daily.     clonazePAM (KLONOPIN) 1 MG tablet TAKE 1/2 TO 1 TABLET(0.5 TO 1 MG) BY MOUTH DAILY AS NEEDED FOR ANXIETY OR SEVERE ANXIETY 30 tablet 2   empagliflozin (JARDIANCE) 25 MG TABS tablet Take 25 mg by mouth daily. 30 tablet 1   FLUCELVAX QUADRIVALENT 0.5 ML injection      glycopyrrolate (ROBINUL) 1 MG tablet TAKE 1 TABLET(1 MG) BY  MOUTH TWICE DAILY     insulin aspart (NOVOLOG) 100 UNIT/ML injection INJECT UP TO 120 UNITS UNDER THE SKIN AS DIRECTED DAILY VIA INSULIN PUMP     levothyroxine (SYNTHROID) 75 MCG tablet TAKE 1 TABLET BY MOUTH DAILY AT 6 AM 90 tablet 0   losartan-hydrochlorothiazide (HYZAAR) 100-25 MG tablet TAKE 1 TABLET BY MOUTH EVERY DAY     pantoprazole (PROTONIX) 40 MG tablet TAKE 1 TABLET(40 MG) BY MOUTH TWICE DAILY     venlafaxine XR (EFFEXOR-XR) 150 MG 24 hr capsule Take 300 mg by mouth daily with breakfast.     zolpidem (AMBIEN) 10 MG tablet TAKE 1 TABLET(10 MG) BY MOUTH AT BEDTIME AS NEEDED FOR SLEEP 30 tablet 1   OLANZapine (ZYPREXA) 15 MG tablet TAKE 1 TABLET(15 MG) BY MOUTH AT BEDTIME 30 tablet 0   No current facility-administered medications for this visit.     Musculoskeletal: Strength & Muscle Tone: within normal limits Gait & Station: normal Patient leans: N/A  Psychiatric Specialty Exam: Review of Systems  Musculoskeletal:        Left foot pain - gangrene of toe - currently undergoing treatment  Psychiatric/Behavioral:  Negative for agitation, behavioral problems, confusion, decreased concentration, dysphoric mood, hallucinations, self-injury, sleep disturbance and suicidal ideas. The patient is not nervous/anxious and is not hyperactive.   All other systems reviewed and are negative.  Blood pressure 123/79, pulse (!) 103, temperature 98.3 F (36.8 C), temperature source Temporal, weight 276 lb (125.2 kg).Body mass index is 37.43 kg/m.  General Appearance: Casual  Eye Contact:  Fair  Speech:  slow, baseline, history of tracheostomy  Volume:  Normal  Mood:  Euthymic  Affect:  Full Range  Thought Process:  Goal Directed and Descriptions of Associations: Intact  Orientation:  Full (Time, Place, and Person)  Thought Content: Logical   Suicidal Thoughts:  No  Homicidal Thoughts:  No  Memory:  Immediate;   Fair Recent;   Fair Remote;   Fair  Judgement:  Fair  Insight:  Fair   Psychomotor Activity:  Normal  Concentration:  Concentration: Fair and Attention Span: Fair  Recall:  [employer] of Knowledge: Fair  Language: Fair  Akathisia:  No  Handed:  Right  AIMS (if indicated): done, 0  Assets:  Communication Skills Desire for Improvement Housing Social Support  ADL's:  Intact  Cognition: WNL  Sleep:  Fair   Screenings: [place] Office Visit from 08/03/2020 in [facility] Total Score 0      AUDIT    Flowsheet Row Admission (Discharged) from 01/22/2019 in [facility] Admission (Discharged) from 12/31/2018 in [facility] Admission (Discharged) from 09/14/2016 in [facility]  Alcohol Use Disorder Identification Test Final Score (AUDIT) 0 0 1      ECT-MADRS    Flowsheet Row Admission (Discharged) from 01/22/2019 in [facility] Total Score 27      Mini-Mental    [place] Admission (Discharged) from 01/22/2019 in [facility]  Total Score (max 30 points ) 30      PHQ2-9    [place] Visit from 03/22/2021 in [facility] Video Visit from 11/25/2020 in [facility] Video Visit from 10/26/2020 in [facility] Video Visit from 09/16/2020 in [facility] Video Visit from 08/26/2020 in [facility]  PHQ-2 Total Score 0 0 3 3 3   PHQ-9 Total Score -- 5 10 10 12       Flowsheet Row Admission (Discharged) from 02/14/2021 in [facility] Video Visit from 11/25/2020 in [facility] Video Visit from 10/26/2020 in [facility] No Risk Low Risk Low Risk        Assessment and Plan: [name] is a 60 year old Caucasian male, married, on  disability, history of multiple medical problems, MDD, GAD was evaluated in office today.  Patient with recent episode of depression with dosage increase of venlafaxine by his primary care provider on 03/07/2021, reports improvement although he does have concerns about adverse side effects.  Plan as noted below.  Plan MDD-improving Wellbutrin XL 300 mg p.o. daily Effexor extended release 300 mg p.o. daily.  Patient with side effects of sweatiness, would like to give the medication more time.  We will consider readjusting the dosage as needed. Zyprexa 15 mg p.o. nightly  GAD-stable Venlafaxine as prescribed Klonopin 0.5-1 mg as needed for severe anxiety attacks Reviewed [place] PMP aware Continue CBT  Insomnia-stable Ambien 10 mg p.o. nightly  Weight gain secondary to psychotropics-unstable Patient to monitor his diet, exercise. We will consider reducing the dosage of olanzapine once he is stable.  Reviewed notes from his providers-Dr.Cline - podiatry - 03/17/2021., Dr. Jimmie Molly 03/07/2021-patient's venlafaxine dosage was increased to address his depression.  Follow-up in clinic in 3 weeks or sooner in person.  This note was generated in part or whole with voice recognition software. Voice recognition is usually quite accurate but there are transcription errors that can and very often do occur. I apologize for any typographical errors that were not detected and corrected.       Ursula Alert, MD 03/23/2021, 8:19 AM

## 2021-03-23 ENCOUNTER — Telehealth (INDEPENDENT_AMBULATORY_CARE_PROVIDER_SITE_OTHER): Payer: Self-pay | Admitting: Nurse Practitioner

## 2021-03-23 ENCOUNTER — Ambulatory Visit (INDEPENDENT_AMBULATORY_CARE_PROVIDER_SITE_OTHER): Payer: HMO | Admitting: Nurse Practitioner

## 2021-03-23 ENCOUNTER — Ambulatory Visit (INDEPENDENT_AMBULATORY_CARE_PROVIDER_SITE_OTHER): Payer: HMO

## 2021-03-23 VITALS — BP 138/87 | HR 100 | Ht 72.0 in | Wt 271.0 lb

## 2021-03-23 DIAGNOSIS — I10 Essential (primary) hypertension: Secondary | ICD-10-CM

## 2021-03-23 DIAGNOSIS — I70262 Atherosclerosis of native arteries of extremities with gangrene, left leg: Secondary | ICD-10-CM

## 2021-03-23 DIAGNOSIS — E785 Hyperlipidemia, unspecified: Secondary | ICD-10-CM | POA: Diagnosis not present

## 2021-03-24 MED ORDER — OXYCODONE-ACETAMINOPHEN 10-325 MG PO TABS: 1.0000 | ORAL_TABLET | Freq: Three times a day (TID) | ORAL | 0 refills | Status: DC | PRN

## 2021-03-24 NOTE — Telephone Encounter (Signed)
Please refill oxycodone.

## 2021-03-24 NOTE — Telephone Encounter (Signed)
Pt called again this morning and asked for trhe refill to be called in. [facility] on [address]. He would like a call when the RX is sent in.

## 2021-03-24 NOTE — Telephone Encounter (Signed)
I sent it this morning

## 2021-03-24 NOTE — Telephone Encounter (Signed)
sent 

## 2021-03-27 ENCOUNTER — Encounter (INDEPENDENT_AMBULATORY_CARE_PROVIDER_SITE_OTHER): Payer: Self-pay | Admitting: Nurse Practitioner

## 2021-03-27 NOTE — Progress Notes (Signed)
Subjective:    Patient ID: [name], male    DOB: 1960-08-10, 60 y.o.   MRN: [id number] Chief Complaint  Patient presents with   Follow-up    Pt want 2 wk U/S    [name] is a 60 year old male that today in follow up of his PAD.  The patient has gangrenous changes to the tips of his first and third toes on his left lower extremity that are being treated currently by podiatry.  His toes remain painful.  He denies claudication symptoms however.  He notes that there has been some improvement with the wounds.  Today noninvasive studies show an ABI of 0.65 on the right and 0.89 on the left.  He has biphasic tibial artery waveforms bilaterally with good toe waveforms on the right.  Left toe waveforms were not done due to warmth.    Review of Systems  Skin:  Positive for wound.  All other systems reviewed and are negative.     Objective:   Physical Exam Vitals reviewed.  HENT:     Head: Normocephalic.  Cardiovascular:     Rate and Rhythm: Normal rate.     Pulses: Decreased pulses.  Pulmonary:     Effort: Pulmonary effort is normal.  Feet:     Left foot:     Skin integrity: Ulcer present.  Skin:    General: Skin is warm and dry.  Neurological:     Mental Status: He is alert and oriented to person, place, and time.  Psychiatric:        Mood and Affect: Mood normal.        Thought Content: Thought content normal.        Judgment: Judgment normal.    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   Thyroid disease      Social History   Socioeconomic History   Marital status: Married    Spouse name: [name]   Number of children: Not on file   Years of education: Not on file   Highest education level: Not on file  Occupational History   Not on file  Tobacco Use   Smoking status: Former    Packs/day: 0.00    Types: Cigarettes    Quit date: 04/09/2013    Years since quitting: 7.9   Smokeless tobacco: Never  Vaping Use   Vaping Use: Never used  Substance and Sexual Activity   Alcohol use: Yes    Alcohol/week: 2.0 standard drinks    Types: 2 Shots of liquor per week    Comment: rare   Drug use: No    Comment: Pt denied; UDS not available   Sexual activity: Yes    Partners: Female    Birth control/protection: Condom  Other Topics Concern   Not on file  Social History Narrative   Not on file   Social Determinants  of Health   Financial Resource Strain: Not on file  Food Insecurity: Not on file  Transportation Needs: Not on file  Physical Activity: Not on file  Stress: Not on file  Social Connections: Not on file  Intimate Partner Violence: Not on file    Past Surgical History:  Procedure Laterality Date   COLONOSCOPY WITH PROPOFOL N/A 05/15/2018   Procedure: COLONOSCOPY WITH PROPOFOL;  Surgeon: [name], [name], MD;  Location: [facility];  Service: Gastroenterology;  Laterality: N/A;   ESOPHAGOGASTRODUODENOSCOPY N/A 05/15/2018   Procedure: ESOPHAGOGASTRODUODENOSCOPY (EGD);  Surgeon: [name], [name], MD;  Location: [facility];  Service: Gastroenterology;  Laterality: N/A;   FRACTURE SURGERY     Heel bone reconstruction Left    HERNIA REPAIR  81/0175   Umbilical hernia repair    LARYNGECTOMY     LOWER EXTREMITY ANGIOGRAPHY Left 01/31/2021   Procedure: LOWER EXTREMITY ANGIOGRAPHY;  Surgeon: Algernon Huxley, MD;  Location: [facility];  Service: Cardiovascular;  Laterality: Left;   LOWER EXTREMITY ANGIOGRAPHY Right 02/14/2021   Procedure: LOWER EXTREMITY ANGIOGRAPHY;   Surgeon: Algernon Huxley, MD;  Location: [facility];  Service: Cardiovascular;  Laterality: Right;   NECK SURGERY     fusion   SPINE SURGERY     TRACHEOSTOMY  2014   from [place] attempt    Family History  Problem Relation Age of Onset   Osteoporosis Mother    Diabetes Mother    Hypertension Father    Mental illness Neg Hx     Allergies  Allergen Reactions   Buspar [Buspirone]     Makes the patient "flip out"   Depakote [Valproic Acid]     Causes excessive drowsiness   Clopidogrel Rash   Gabapentin Itching and Rash    CBC Latest Ref Rng & Units 02/15/2021 02/14/2021 02/14/2021  WBC 4.0 - 10.5 K/uL 10.8(H) 10.4 8.5  Hemoglobin 13.0 - 17.0 g/dL 13.8 13.7 13.9  Hematocrit 39.0 - 52.0 % 41.6 41.7 42.4  Platelets 150 - 400 K/uL 412(H) 405(H) 396      CMP     Component Value Date/Time   NA 139 02/14/2021 1731   NA 140 02/20/2014 0806   K 3.9 02/14/2021 1731   K 4.5 02/20/2014 0806   CL 103 02/14/2021 1731   CL 107 02/20/2014 0806   CO2 25 02/14/2021 1731   CO2 28 02/20/2014 0806   GLUCOSE 117 (H) 02/14/2021 1731   GLUCOSE 201 (H) 02/20/2014 0806   BUN 37 (H) 02/14/2021 1731   BUN 18 02/20/2014 0806   CREATININE 1.47 (H) 02/14/2021 1731   CREATININE 1.26 02/20/2014 0806   CALCIUM 9.4 02/14/2021 1731   CALCIUM 8.7 02/20/2014 0806   PROT 7.1 02/14/2021 1731   PROT 6.9 02/17/2014 1106   ALBUMIN 3.3 (L) 02/14/2021 1731   ALBUMIN 3.4 02/17/2014 1106   AST 15 02/14/2021 1731   AST 20 02/17/2014 1106   ALT 16 02/14/2021 1731   ALT 25 02/17/2014 1106   ALKPHOS 117 02/14/2021 1731   ALKPHOS 115 02/17/2014 1106   BILITOT 0.5 02/14/2021 1731   BILITOT 0.3 02/17/2014 1106   GFRNONAA 55 (L) 02/14/2021 1731   GFRNONAA >60 02/20/2014 0806   GFRNONAA >60 07/14/2013 0959   GFRAA 48 (L) 01/22/2019 1431   GFRAA >60 02/20/2014 0806   GFRAA >60 07/14/2013 0959     VAS [place] ABI WITH/WO TBI  Result Date: 02/22/2021  LOWER EXTREMITY DOPPLER STUDY Patient Name:  Joel Mogel  Holder  Date of Exam:   02/21/2021 Medical Rec #: [id number]      Accession #:    [id number] Date of Birth: [birth year]      Patient Gender: M Patient Age:   59 years Exam Location:  [facility] Procedure:      VAS [place] ABI WITH/WO TBI Referring Phys: --------------------------------------------------------------------------------  Indications: Peripheral artery disease.  Vascular Interventions: 01/31/2021 Left thrombectomy and SFA-Pop stents. Comparison Study: 01/25/2021 Performing Technologist: Concha Norway RVT  Examination Guidelines: A complete evaluation includes at minimum, Doppler waveform signals and systolic blood pressure reading at the level of bilateral brachial, anterior tibial, and posterior tibial arteries, when vessel segments are accessible. Bilateral testing is considered an integral part of a complete examination. Photoelectric Plethysmograph (PPG) waveforms and toe systolic pressure readings are included as required and additional duplex testing as needed. Limited examinations for reoccurring indications may be performed as noted.  ABI Findings: +---------+------------------+-----+----------+--------+ Right    Rt Pressure (mmHg)IndexWaveform  Comment  +---------+------------------+-----+----------+--------+ Brachial 173                                       +---------+------------------+-----+----------+--------+ ATA      194                    biphasic  1.12     +---------+------------------+-----+----------+--------+ PTA      122               0.71 monophasic         +---------+------------------+-----+----------+--------+ Great Toe120               0.69 Abnormal           +---------+------------------+-----+----------+--------+ +---------+------------------+-----+----------+------------------+ Left     Lt Pressure (mmHg)IndexWaveform  Comment            +---------+------------------+-----+----------+------------------+ ATA      123                     monophasic.71                +---------+------------------+-----+----------+------------------+ PTA      160               0.92 biphasic                     +---------+------------------+-----+----------+------------------+ Great Toe                       Abnormal  Wound on great toe +---------+------------------+-----+----------+------------------+ +-------+-----------+-----------+------------+------------+ ABI/TBIToday's ABIToday's TBIPrevious ABIPrevious TBI +-------+-----------+-----------+------------+------------+ Right  1.12       .69        NonComp     .32          +-------+-----------+-----------+------------+------------+ Left   .92        wound      .41         absent       +-------+-----------+-----------+------------+------------+  Left ABIs appear increased compared to prior study on 02/05/2021.  Summary: Right: Resting right ankle-brachial index is within normal range. No evidence of significant right lower extremity arterial disease. The right toe-brachial index is normal. Left: Resting left ankle-brachial index indicates mild left lower extremity arterial disease. The left toe-brachial index is abnormal. Left great toe has wound. No pressure done. Waveform seen but is abnormal.  *See table(s) above for measurements and  observations.  Electronically signed by [name] MD on 02/22/2021 at 9:16:00 AM.    Final        Assessment & Plan:   1. Atherosclerosis of native artery of left lower extremity with gangrene ([place]) The noninvasive studies today do show that the patient should have adequate perfusion for continued wound healing.  We will maintain a very close eye on the patient's perfusion due to numerous wounds and clinical treatment by podiatry.  We will have him return to the office in 4 weeks for noninvasive studies.  2. Primary hypertension Continue antihypertensive medications as already ordered, these medications have been reviewed and there are no  changes at this time.   3. Hyperlipidemia, unspecified hyperlipidemia type Continue statin as ordered and reviewed, no changes at this time    Current Outpatient Medications on File Prior to Visit  Medication Sig Dispense Refill   amLODipine (NORVASC) 5 MG tablet Take 1 tablet (5 mg total) by mouth daily. 30 tablet 1   amoxicillin-clavulanate (AUGMENTIN) 875-125 MG tablet Take 1 tablet by mouth 2 (two) times daily. 20 tablet 0   apixaban (ELIQUIS) 5 MG TABS tablet Take 1 tablet (5 mg total) by mouth 2 (two) times daily. 60 tablet 5   aspirin EC 81 MG tablet Take 1 tablet (81 mg total) by mouth daily. 150 tablet 2   buPROPion (WELLBUTRIN XL) 300 MG 24 hr tablet Take 1 tablet (300 mg total) by mouth daily. 90 tablet 1   chlorhexidine (PERIDEX) 0.12 % solution 2 (two) times daily.     clonazePAM (KLONOPIN) 1 MG tablet TAKE 1/2 TO 1 TABLET(0.5 TO 1 MG) BY MOUTH DAILY AS NEEDED FOR ANXIETY OR SEVERE ANXIETY 30 tablet 2   empagliflozin (JARDIANCE) 25 MG TABS tablet Take 25 mg by mouth daily. 30 tablet 1   FLUCELVAX QUADRIVALENT 0.5 ML injection      glycopyrrolate (ROBINUL) 1 MG tablet TAKE 1 TABLET(1 MG) BY MOUTH TWICE DAILY     insulin aspart (NOVOLOG) 100 UNIT/ML injection INJECT UP TO 120 UNITS UNDER THE SKIN AS DIRECTED DAILY VIA INSULIN PUMP     levothyroxine (SYNTHROID) 75 MCG tablet TAKE 1 TABLET BY MOUTH DAILY AT 6 AM 90 tablet 0   losartan-hydrochlorothiazide (HYZAAR) 100-25 MG tablet TAKE 1 TABLET BY MOUTH EVERY DAY     OLANZapine (ZYPREXA) 15 MG tablet TAKE 1 TABLET(15 MG) BY MOUTH AT BEDTIME 30 tablet 0   pantoprazole (PROTONIX) 40 MG tablet TAKE 1 TABLET(40 MG) BY MOUTH TWICE DAILY     venlafaxine XR (EFFEXOR-XR) 150 MG 24 hr capsule Take 300 mg by mouth daily with breakfast.     zolpidem (AMBIEN) 10 MG tablet TAKE 1 TABLET(10 MG) BY MOUTH AT BEDTIME AS NEEDED FOR SLEEP 30 tablet 1   SANTYL ointment Apply topically.     No current facility-administered medications on file prior to  visit.    There are no Patient Instructions on file for this visit. No follow-ups on file.   Kris Hartmann, NP

## 2021-03-30 DIAGNOSIS — M86172 Other acute osteomyelitis, left ankle and foot: Secondary | ICD-10-CM | POA: Diagnosis not present

## 2021-03-30 DIAGNOSIS — I96 Gangrene, not elsewhere classified: Secondary | ICD-10-CM | POA: Diagnosis not present

## 2021-03-30 DIAGNOSIS — I739 Peripheral vascular disease, unspecified: Secondary | ICD-10-CM | POA: Diagnosis not present

## 2021-03-30 DIAGNOSIS — L97522 Non-pressure chronic ulcer of other part of left foot with fat layer exposed: Secondary | ICD-10-CM | POA: Diagnosis not present

## 2021-03-30 DIAGNOSIS — R809 Proteinuria, unspecified: Secondary | ICD-10-CM | POA: Diagnosis not present

## 2021-03-30 DIAGNOSIS — L97521 Non-pressure chronic ulcer of other part of left foot limited to breakdown of skin: Secondary | ICD-10-CM | POA: Diagnosis not present

## 2021-03-30 DIAGNOSIS — E1029 Type 1 diabetes mellitus with other diabetic kidney complication: Secondary | ICD-10-CM | POA: Diagnosis not present

## 2021-04-01 DIAGNOSIS — E1165 Type 2 diabetes mellitus with hyperglycemia: Secondary | ICD-10-CM | POA: Diagnosis not present

## 2021-04-01 DIAGNOSIS — E1152 Type 2 diabetes mellitus with diabetic peripheral angiopathy with gangrene: Secondary | ICD-10-CM | POA: Diagnosis not present

## 2021-04-01 DIAGNOSIS — I10 Essential (primary) hypertension: Secondary | ICD-10-CM | POA: Diagnosis not present

## 2021-04-01 DIAGNOSIS — T82898A Other specified complication of vascular prosthetic devices, implants and grafts, initial encounter: Secondary | ICD-10-CM | POA: Diagnosis not present

## 2021-04-01 DIAGNOSIS — E10621 Type 1 diabetes mellitus with foot ulcer: Secondary | ICD-10-CM | POA: Diagnosis not present

## 2021-04-01 DIAGNOSIS — I2510 Atherosclerotic heart disease of native coronary artery without angina pectoris: Secondary | ICD-10-CM | POA: Diagnosis not present

## 2021-04-01 DIAGNOSIS — E1069 Type 1 diabetes mellitus with other specified complication: Secondary | ICD-10-CM | POA: Diagnosis not present

## 2021-04-01 DIAGNOSIS — E871 Hypo-osmolality and hyponatremia: Secondary | ICD-10-CM | POA: Diagnosis not present

## 2021-04-01 DIAGNOSIS — I96 Gangrene, not elsewhere classified: Secondary | ICD-10-CM | POA: Diagnosis not present

## 2021-04-01 DIAGNOSIS — L089 Local infection of the skin and subcutaneous tissue, unspecified: Secondary | ICD-10-CM | POA: Diagnosis not present

## 2021-04-01 DIAGNOSIS — E109 Type 1 diabetes mellitus without complications: Secondary | ICD-10-CM | POA: Diagnosis not present

## 2021-04-01 DIAGNOSIS — Z043 Encounter for examination and observation following other accident: Secondary | ICD-10-CM | POA: Diagnosis not present

## 2021-04-01 DIAGNOSIS — E1052 Type 1 diabetes mellitus with diabetic peripheral angiopathy with gangrene: Secondary | ICD-10-CM | POA: Diagnosis not present

## 2021-04-01 DIAGNOSIS — R0600 Dyspnea, unspecified: Secondary | ICD-10-CM | POA: Diagnosis not present

## 2021-04-01 DIAGNOSIS — N189 Chronic kidney disease, unspecified: Secondary | ICD-10-CM | POA: Diagnosis not present

## 2021-04-01 DIAGNOSIS — E1110 Type 2 diabetes mellitus with ketoacidosis without coma: Secondary | ICD-10-CM | POA: Diagnosis not present

## 2021-04-01 DIAGNOSIS — Z86718 Personal history of other venous thrombosis and embolism: Secondary | ICD-10-CM | POA: Diagnosis not present

## 2021-04-01 DIAGNOSIS — R410 Disorientation, unspecified: Secondary | ICD-10-CM | POA: Diagnosis not present

## 2021-04-01 DIAGNOSIS — M869 Osteomyelitis, unspecified: Secondary | ICD-10-CM | POA: Insufficient documentation

## 2021-04-01 DIAGNOSIS — Z789 Other specified health status: Secondary | ICD-10-CM | POA: Diagnosis not present

## 2021-04-01 DIAGNOSIS — E1022 Type 1 diabetes mellitus with diabetic chronic kidney disease: Secondary | ICD-10-CM | POA: Diagnosis not present

## 2021-04-01 DIAGNOSIS — F05 Delirium due to known physiological condition: Secondary | ICD-10-CM | POA: Diagnosis not present

## 2021-04-01 DIAGNOSIS — M79606 Pain in leg, unspecified: Secondary | ICD-10-CM | POA: Diagnosis not present

## 2021-04-01 DIAGNOSIS — Z6836 Body mass index (BMI) 36.0-36.9, adult: Secondary | ICD-10-CM | POA: Diagnosis not present

## 2021-04-01 DIAGNOSIS — F419 Anxiety disorder, unspecified: Secondary | ICD-10-CM | POA: Diagnosis not present

## 2021-04-01 DIAGNOSIS — R0602 Shortness of breath: Secondary | ICD-10-CM | POA: Diagnosis not present

## 2021-04-01 DIAGNOSIS — M868X7 Other osteomyelitis, ankle and foot: Secondary | ICD-10-CM | POA: Diagnosis not present

## 2021-04-01 DIAGNOSIS — R0902 Hypoxemia: Secondary | ICD-10-CM | POA: Diagnosis not present

## 2021-04-01 DIAGNOSIS — F1390 Sedative, hypnotic, or anxiolytic use, unspecified, uncomplicated: Secondary | ICD-10-CM | POA: Diagnosis not present

## 2021-04-01 DIAGNOSIS — D142 Benign neoplasm of trachea: Secondary | ICD-10-CM | POA: Diagnosis not present

## 2021-04-01 DIAGNOSIS — M86672 Other chronic osteomyelitis, left ankle and foot: Secondary | ICD-10-CM | POA: Diagnosis not present

## 2021-04-01 DIAGNOSIS — Z794 Long term (current) use of insulin: Secondary | ICD-10-CM | POA: Diagnosis not present

## 2021-04-01 DIAGNOSIS — M87072 Idiopathic aseptic necrosis of left ankle: Secondary | ICD-10-CM | POA: Diagnosis not present

## 2021-04-01 DIAGNOSIS — S91302A Unspecified open wound, left foot, initial encounter: Secondary | ICD-10-CM | POA: Diagnosis not present

## 2021-04-01 DIAGNOSIS — J381 Polyp of vocal cord and larynx: Secondary | ICD-10-CM | POA: Diagnosis not present

## 2021-04-01 DIAGNOSIS — I739 Peripheral vascular disease, unspecified: Secondary | ICD-10-CM | POA: Diagnosis not present

## 2021-04-01 DIAGNOSIS — N183 Chronic kidney disease, stage 3 unspecified: Secondary | ICD-10-CM | POA: Diagnosis not present

## 2021-04-01 DIAGNOSIS — E8721 Acute metabolic acidosis: Secondary | ICD-10-CM | POA: Diagnosis not present

## 2021-04-01 DIAGNOSIS — R609 Edema, unspecified: Secondary | ICD-10-CM | POA: Diagnosis not present

## 2021-04-01 DIAGNOSIS — E10649 Type 1 diabetes mellitus with hypoglycemia without coma: Secondary | ICD-10-CM | POA: Diagnosis not present

## 2021-04-01 DIAGNOSIS — Z01810 Encounter for preprocedural cardiovascular examination: Secondary | ICD-10-CM | POA: Diagnosis not present

## 2021-04-01 DIAGNOSIS — K5900 Constipation, unspecified: Secondary | ICD-10-CM | POA: Diagnosis not present

## 2021-04-01 DIAGNOSIS — Z87891 Personal history of nicotine dependence: Secondary | ICD-10-CM | POA: Diagnosis not present

## 2021-04-01 DIAGNOSIS — L97529 Non-pressure chronic ulcer of other part of left foot with unspecified severity: Secondary | ICD-10-CM | POA: Diagnosis not present

## 2021-04-01 DIAGNOSIS — R451 Restlessness and agitation: Secondary | ICD-10-CM | POA: Diagnosis not present

## 2021-04-01 DIAGNOSIS — Z20822 Contact with and (suspected) exposure to covid-19: Secondary | ICD-10-CM | POA: Diagnosis not present

## 2021-04-01 DIAGNOSIS — R638 Other symptoms and signs concerning food and fluid intake: Secondary | ICD-10-CM | POA: Diagnosis not present

## 2021-04-01 DIAGNOSIS — Z9641 Presence of insulin pump (external) (internal): Secondary | ICD-10-CM | POA: Diagnosis not present

## 2021-04-01 DIAGNOSIS — F39 Unspecified mood [affective] disorder: Secondary | ICD-10-CM | POA: Diagnosis not present

## 2021-04-01 DIAGNOSIS — N179 Acute kidney failure, unspecified: Secondary | ICD-10-CM | POA: Diagnosis not present

## 2021-04-01 DIAGNOSIS — J988 Other specified respiratory disorders: Secondary | ICD-10-CM | POA: Diagnosis not present

## 2021-04-01 DIAGNOSIS — Z7409 Other reduced mobility: Secondary | ICD-10-CM | POA: Diagnosis not present

## 2021-04-01 DIAGNOSIS — M86272 Subacute osteomyelitis, left ankle and foot: Secondary | ICD-10-CM | POA: Diagnosis not present

## 2021-04-01 DIAGNOSIS — E108 Type 1 diabetes mellitus with unspecified complications: Secondary | ICD-10-CM | POA: Diagnosis not present

## 2021-04-01 DIAGNOSIS — J398 Other specified diseases of upper respiratory tract: Secondary | ICD-10-CM | POA: Diagnosis not present

## 2021-04-01 DIAGNOSIS — E7800 Pure hypercholesterolemia, unspecified: Secondary | ICD-10-CM | POA: Diagnosis not present

## 2021-04-01 DIAGNOSIS — K219 Gastro-esophageal reflux disease without esophagitis: Secondary | ICD-10-CM | POA: Diagnosis not present

## 2021-04-01 DIAGNOSIS — E11628 Type 2 diabetes mellitus with other skin complications: Secondary | ICD-10-CM | POA: Diagnosis not present

## 2021-04-01 DIAGNOSIS — E1010 Type 1 diabetes mellitus with ketoacidosis without coma: Secondary | ICD-10-CM | POA: Diagnosis not present

## 2021-04-01 DIAGNOSIS — G8918 Other acute postprocedural pain: Secondary | ICD-10-CM | POA: Diagnosis not present

## 2021-04-01 DIAGNOSIS — J9503 Malfunction of tracheostomy stoma: Secondary | ICD-10-CM | POA: Diagnosis not present

## 2021-04-01 DIAGNOSIS — Z930 Tracheostomy status: Secondary | ICD-10-CM | POA: Diagnosis not present

## 2021-04-01 DIAGNOSIS — Z89512 Acquired absence of left leg below knee: Secondary | ICD-10-CM | POA: Diagnosis not present

## 2021-04-01 DIAGNOSIS — E1065 Type 1 diabetes mellitus with hyperglycemia: Secondary | ICD-10-CM | POA: Diagnosis not present

## 2021-04-01 DIAGNOSIS — I252 Old myocardial infarction: Secondary | ICD-10-CM | POA: Diagnosis not present

## 2021-04-01 DIAGNOSIS — E1042 Type 1 diabetes mellitus with diabetic polyneuropathy: Secondary | ICD-10-CM | POA: Diagnosis not present

## 2021-04-01 DIAGNOSIS — D72829 Elevated white blood cell count, unspecified: Secondary | ICD-10-CM | POA: Diagnosis not present

## 2021-04-01 DIAGNOSIS — M87875 Other osteonecrosis, left foot: Secondary | ICD-10-CM | POA: Diagnosis not present

## 2021-04-01 DIAGNOSIS — I129 Hypertensive chronic kidney disease with stage 1 through stage 4 chronic kidney disease, or unspecified chronic kidney disease: Secondary | ICD-10-CM | POA: Diagnosis not present

## 2021-04-01 DIAGNOSIS — E875 Hyperkalemia: Secondary | ICD-10-CM | POA: Diagnosis not present

## 2021-04-01 DIAGNOSIS — G4700 Insomnia, unspecified: Secondary | ICD-10-CM | POA: Diagnosis not present

## 2021-04-01 DIAGNOSIS — E039 Hypothyroidism, unspecified: Secondary | ICD-10-CM | POA: Diagnosis not present

## 2021-04-01 DIAGNOSIS — J386 Stenosis of larynx: Secondary | ICD-10-CM | POA: Diagnosis not present

## 2021-04-11 ENCOUNTER — Telehealth: Payer: Self-pay | Admitting: Psychiatry

## 2021-04-11 NOTE — Telephone Encounter (Signed)
Noted, thank you

## 2021-04-11 NOTE — Telephone Encounter (Signed)
Called to cancel appt (thurs) - has to have surgery and will call at some point to start appts again.   Pt wanted me to let you know

## 2021-04-12 NOTE — Telephone Encounter (Signed)
Dr. Deneise Lever called states that he needed to speak with you about medications you prescribed and the patient being in the hospital.

## 2021-04-12 NOTE — Telephone Encounter (Signed)
Attempted to contact Dr. Claybon Jabs to leave a voicemail.

## 2021-04-14 ENCOUNTER — Other Ambulatory Visit: Payer: Self-pay | Admitting: Psychiatry

## 2021-04-14 ENCOUNTER — Ambulatory Visit: Payer: HMO | Admitting: Psychiatry

## 2021-04-14 DIAGNOSIS — R635 Abnormal weight gain: Secondary | ICD-10-CM

## 2021-04-19 ENCOUNTER — Other Ambulatory Visit (INDEPENDENT_AMBULATORY_CARE_PROVIDER_SITE_OTHER): Payer: Self-pay | Admitting: Nurse Practitioner

## 2021-04-19 DIAGNOSIS — Z9889 Other specified postprocedural states: Secondary | ICD-10-CM

## 2021-04-19 DIAGNOSIS — I739 Peripheral vascular disease, unspecified: Secondary | ICD-10-CM

## 2021-04-20 ENCOUNTER — Encounter (INDEPENDENT_AMBULATORY_CARE_PROVIDER_SITE_OTHER): Payer: HMO

## 2021-04-20 ENCOUNTER — Ambulatory Visit (INDEPENDENT_AMBULATORY_CARE_PROVIDER_SITE_OTHER): Payer: HMO | Admitting: Nurse Practitioner

## 2021-04-23 ENCOUNTER — Other Ambulatory Visit: Payer: Self-pay | Admitting: Psychiatry

## 2021-04-23 DIAGNOSIS — E1042 Type 1 diabetes mellitus with diabetic polyneuropathy: Secondary | ICD-10-CM | POA: Diagnosis not present

## 2021-04-23 DIAGNOSIS — R140 Abdominal distension (gaseous): Secondary | ICD-10-CM | POA: Diagnosis not present

## 2021-04-23 DIAGNOSIS — I252 Old myocardial infarction: Secondary | ICD-10-CM | POA: Diagnosis not present

## 2021-04-23 DIAGNOSIS — Z4781 Encounter for orthopedic aftercare following surgical amputation: Secondary | ICD-10-CM | POA: Diagnosis not present

## 2021-04-23 DIAGNOSIS — Z89512 Acquired absence of left leg below knee: Secondary | ICD-10-CM | POA: Diagnosis not present

## 2021-04-23 DIAGNOSIS — T8781 Dehiscence of amputation stump: Secondary | ICD-10-CM | POA: Diagnosis not present

## 2021-04-23 DIAGNOSIS — T8789 Other complications of amputation stump: Secondary | ICD-10-CM | POA: Diagnosis not present

## 2021-04-23 DIAGNOSIS — E1051 Type 1 diabetes mellitus with diabetic peripheral angiopathy without gangrene: Secondary | ICD-10-CM | POA: Diagnosis not present

## 2021-04-23 DIAGNOSIS — N189 Chronic kidney disease, unspecified: Secondary | ICD-10-CM | POA: Diagnosis not present

## 2021-04-23 DIAGNOSIS — R0789 Other chest pain: Secondary | ICD-10-CM | POA: Diagnosis not present

## 2021-04-23 DIAGNOSIS — E1022 Type 1 diabetes mellitus with diabetic chronic kidney disease: Secondary | ICD-10-CM | POA: Diagnosis not present

## 2021-04-23 DIAGNOSIS — Z5320 Procedure and treatment not carried out because of patient's decision for unspecified reasons: Secondary | ICD-10-CM | POA: Diagnosis not present

## 2021-04-23 DIAGNOSIS — F419 Anxiety disorder, unspecified: Secondary | ICD-10-CM | POA: Diagnosis not present

## 2021-04-23 DIAGNOSIS — E109 Type 1 diabetes mellitus without complications: Secondary | ICD-10-CM | POA: Diagnosis not present

## 2021-04-23 DIAGNOSIS — Z430 Encounter for attention to tracheostomy: Secondary | ICD-10-CM | POA: Diagnosis not present

## 2021-04-23 DIAGNOSIS — W1839XA Other fall on same level, initial encounter: Secondary | ICD-10-CM | POA: Diagnosis not present

## 2021-04-23 DIAGNOSIS — Z9181 History of falling: Secondary | ICD-10-CM | POA: Diagnosis not present

## 2021-04-23 DIAGNOSIS — T8744 Infection of amputation stump, left lower extremity: Secondary | ICD-10-CM | POA: Diagnosis not present

## 2021-04-23 DIAGNOSIS — K219 Gastro-esophageal reflux disease without esophagitis: Secondary | ICD-10-CM | POA: Diagnosis not present

## 2021-04-23 DIAGNOSIS — Z9641 Presence of insulin pump (external) (internal): Secondary | ICD-10-CM | POA: Diagnosis not present

## 2021-04-23 DIAGNOSIS — E7800 Pure hypercholesterolemia, unspecified: Secondary | ICD-10-CM | POA: Diagnosis not present

## 2021-04-23 DIAGNOSIS — Z9002 Acquired absence of larynx: Secondary | ICD-10-CM | POA: Diagnosis not present

## 2021-04-23 DIAGNOSIS — E1065 Type 1 diabetes mellitus with hyperglycemia: Secondary | ICD-10-CM | POA: Diagnosis not present

## 2021-04-23 DIAGNOSIS — Z597 Insufficient social insurance and welfare support: Secondary | ICD-10-CM | POA: Diagnosis not present

## 2021-04-23 DIAGNOSIS — E10319 Type 1 diabetes mellitus with unspecified diabetic retinopathy without macular edema: Secondary | ICD-10-CM | POA: Diagnosis not present

## 2021-04-23 DIAGNOSIS — Z930 Tracheostomy status: Secondary | ICD-10-CM | POA: Diagnosis not present

## 2021-04-23 DIAGNOSIS — G478 Other sleep disorders: Secondary | ICD-10-CM | POA: Diagnosis not present

## 2021-04-23 DIAGNOSIS — I10 Essential (primary) hypertension: Secondary | ICD-10-CM | POA: Diagnosis not present

## 2021-04-23 DIAGNOSIS — E1169 Type 2 diabetes mellitus with other specified complication: Secondary | ICD-10-CM | POA: Diagnosis not present

## 2021-04-23 DIAGNOSIS — F32A Depression, unspecified: Secondary | ICD-10-CM | POA: Diagnosis not present

## 2021-04-23 DIAGNOSIS — L03116 Cellulitis of left lower limb: Secondary | ICD-10-CM | POA: Diagnosis not present

## 2021-04-23 DIAGNOSIS — R410 Disorientation, unspecified: Secondary | ICD-10-CM | POA: Diagnosis not present

## 2021-04-23 DIAGNOSIS — I129 Hypertensive chronic kidney disease with stage 1 through stage 4 chronic kidney disease, or unspecified chronic kidney disease: Secondary | ICD-10-CM | POA: Diagnosis not present

## 2021-04-23 DIAGNOSIS — Z8673 Personal history of transient ischemic attack (TIA), and cerebral infarction without residual deficits: Secondary | ICD-10-CM | POA: Diagnosis not present

## 2021-04-23 DIAGNOSIS — Z794 Long term (current) use of insulin: Secondary | ICD-10-CM | POA: Diagnosis not present

## 2021-04-23 DIAGNOSIS — Z87891 Personal history of nicotine dependence: Secondary | ICD-10-CM | POA: Diagnosis not present

## 2021-04-23 DIAGNOSIS — Z86718 Personal history of other venous thrombosis and embolism: Secondary | ICD-10-CM | POA: Diagnosis not present

## 2021-04-23 DIAGNOSIS — M869 Osteomyelitis, unspecified: Secondary | ICD-10-CM | POA: Diagnosis not present

## 2021-04-23 DIAGNOSIS — K5900 Constipation, unspecified: Secondary | ICD-10-CM | POA: Diagnosis not present

## 2021-04-23 DIAGNOSIS — R739 Hyperglycemia, unspecified: Secondary | ICD-10-CM | POA: Diagnosis not present

## 2021-04-23 DIAGNOSIS — Z7984 Long term (current) use of oral hypoglycemic drugs: Secondary | ICD-10-CM | POA: Diagnosis not present

## 2021-04-23 DIAGNOSIS — F17210 Nicotine dependence, cigarettes, uncomplicated: Secondary | ICD-10-CM | POA: Diagnosis not present

## 2021-04-23 DIAGNOSIS — E039 Hypothyroidism, unspecified: Secondary | ICD-10-CM | POA: Diagnosis not present

## 2021-04-26 DIAGNOSIS — Z89512 Acquired absence of left leg below knee: Secondary | ICD-10-CM | POA: Insufficient documentation

## 2021-05-05 DIAGNOSIS — Z89512 Acquired absence of left leg below knee: Secondary | ICD-10-CM | POA: Diagnosis not present

## 2021-05-06 DIAGNOSIS — E7800 Pure hypercholesterolemia, unspecified: Secondary | ICD-10-CM | POA: Diagnosis not present

## 2021-05-06 DIAGNOSIS — E1022 Type 1 diabetes mellitus with diabetic chronic kidney disease: Secondary | ICD-10-CM | POA: Diagnosis not present

## 2021-05-06 DIAGNOSIS — Z6834 Body mass index (BMI) 34.0-34.9, adult: Secondary | ICD-10-CM | POA: Diagnosis not present

## 2021-05-06 DIAGNOSIS — E10319 Type 1 diabetes mellitus with unspecified diabetic retinopathy without macular edema: Secondary | ICD-10-CM | POA: Diagnosis not present

## 2021-05-06 DIAGNOSIS — T8131XA Disruption of external operation (surgical) wound, not elsewhere classified, initial encounter: Secondary | ICD-10-CM | POA: Diagnosis not present

## 2021-05-06 DIAGNOSIS — Z9181 History of falling: Secondary | ICD-10-CM | POA: Diagnosis not present

## 2021-05-06 DIAGNOSIS — N189 Chronic kidney disease, unspecified: Secondary | ICD-10-CM | POA: Diagnosis not present

## 2021-05-06 DIAGNOSIS — Z430 Encounter for attention to tracheostomy: Secondary | ICD-10-CM | POA: Diagnosis not present

## 2021-05-06 DIAGNOSIS — Z7984 Long term (current) use of oral hypoglycemic drugs: Secondary | ICD-10-CM | POA: Diagnosis not present

## 2021-05-06 DIAGNOSIS — M96840 Postprocedural hematoma of a musculoskeletal structure following a musculoskeletal system procedure: Secondary | ICD-10-CM | POA: Diagnosis not present

## 2021-05-06 DIAGNOSIS — L7632 Postprocedural hematoma of skin and subcutaneous tissue following other procedure: Secondary | ICD-10-CM | POA: Diagnosis not present

## 2021-05-06 DIAGNOSIS — Z597 Insufficient social insurance and welfare support: Secondary | ICD-10-CM | POA: Diagnosis not present

## 2021-05-06 DIAGNOSIS — T8744 Infection of amputation stump, left lower extremity: Secondary | ICD-10-CM | POA: Diagnosis not present

## 2021-05-06 DIAGNOSIS — T8789 Other complications of amputation stump: Secondary | ICD-10-CM | POA: Diagnosis not present

## 2021-05-06 DIAGNOSIS — E1042 Type 1 diabetes mellitus with diabetic polyneuropathy: Secondary | ICD-10-CM | POA: Diagnosis not present

## 2021-05-06 DIAGNOSIS — I129 Hypertensive chronic kidney disease with stage 1 through stage 4 chronic kidney disease, or unspecified chronic kidney disease: Secondary | ICD-10-CM | POA: Diagnosis not present

## 2021-05-06 DIAGNOSIS — F17210 Nicotine dependence, cigarettes, uncomplicated: Secondary | ICD-10-CM | POA: Diagnosis not present

## 2021-05-06 DIAGNOSIS — R638 Other symptoms and signs concerning food and fluid intake: Secondary | ICD-10-CM | POA: Diagnosis not present

## 2021-05-06 DIAGNOSIS — E039 Hypothyroidism, unspecified: Secondary | ICD-10-CM | POA: Diagnosis not present

## 2021-05-06 DIAGNOSIS — L03116 Cellulitis of left lower limb: Secondary | ICD-10-CM | POA: Diagnosis not present

## 2021-05-06 DIAGNOSIS — T8781 Dehiscence of amputation stump: Secondary | ICD-10-CM | POA: Diagnosis not present

## 2021-05-06 DIAGNOSIS — E1065 Type 1 diabetes mellitus with hyperglycemia: Secondary | ICD-10-CM | POA: Diagnosis not present

## 2021-05-06 DIAGNOSIS — Z9641 Presence of insulin pump (external) (internal): Secondary | ICD-10-CM | POA: Diagnosis not present

## 2021-05-14 DIAGNOSIS — R296 Repeated falls: Secondary | ICD-10-CM | POA: Diagnosis not present

## 2021-05-14 DIAGNOSIS — T8789 Other complications of amputation stump: Secondary | ICD-10-CM | POA: Diagnosis not present

## 2021-05-14 DIAGNOSIS — E7800 Pure hypercholesterolemia, unspecified: Secondary | ICD-10-CM | POA: Diagnosis not present

## 2021-05-14 DIAGNOSIS — Z7951 Long term (current) use of inhaled steroids: Secondary | ICD-10-CM | POA: Diagnosis not present

## 2021-05-14 DIAGNOSIS — Z79899 Other long term (current) drug therapy: Secondary | ICD-10-CM | POA: Diagnosis not present

## 2021-05-14 DIAGNOSIS — S88012A Complete traumatic amputation at knee level, left lower leg, initial encounter: Secondary | ICD-10-CM | POA: Diagnosis not present

## 2021-05-14 DIAGNOSIS — Z8673 Personal history of transient ischemic attack (TIA), and cerebral infarction without residual deficits: Secondary | ICD-10-CM | POA: Diagnosis not present

## 2021-05-14 DIAGNOSIS — Z87891 Personal history of nicotine dependence: Secondary | ICD-10-CM | POA: Diagnosis not present

## 2021-05-14 DIAGNOSIS — Z89512 Acquired absence of left leg below knee: Secondary | ICD-10-CM | POA: Diagnosis not present

## 2021-05-14 DIAGNOSIS — I152 Hypertension secondary to endocrine disorders: Secondary | ICD-10-CM | POA: Diagnosis not present

## 2021-05-14 DIAGNOSIS — Z8249 Family history of ischemic heart disease and other diseases of the circulatory system: Secondary | ICD-10-CM | POA: Diagnosis not present

## 2021-05-14 DIAGNOSIS — Z5189 Encounter for other specified aftercare: Secondary | ICD-10-CM | POA: Diagnosis not present

## 2021-05-14 DIAGNOSIS — E1159 Type 2 diabetes mellitus with other circulatory complications: Secondary | ICD-10-CM | POA: Diagnosis not present

## 2021-05-14 DIAGNOSIS — Z794 Long term (current) use of insulin: Secondary | ICD-10-CM | POA: Diagnosis not present

## 2021-05-14 DIAGNOSIS — J398 Other specified diseases of upper respiratory tract: Secondary | ICD-10-CM | POA: Diagnosis not present

## 2021-05-14 DIAGNOSIS — Z833 Family history of diabetes mellitus: Secondary | ICD-10-CM | POA: Diagnosis not present

## 2021-05-14 DIAGNOSIS — E039 Hypothyroidism, unspecified: Secondary | ICD-10-CM | POA: Diagnosis not present

## 2021-05-14 DIAGNOSIS — Z79890 Hormone replacement therapy (postmenopausal): Secondary | ICD-10-CM | POA: Diagnosis not present

## 2021-05-14 DIAGNOSIS — F32A Depression, unspecified: Secondary | ICD-10-CM | POA: Diagnosis not present

## 2021-05-16 ENCOUNTER — Other Ambulatory Visit: Payer: Self-pay | Admitting: Psychiatry

## 2021-05-16 DIAGNOSIS — F411 Generalized anxiety disorder: Secondary | ICD-10-CM

## 2021-05-18 DIAGNOSIS — I10 Essential (primary) hypertension: Secondary | ICD-10-CM | POA: Diagnosis not present

## 2021-05-18 DIAGNOSIS — Z89512 Acquired absence of left leg below knee: Secondary | ICD-10-CM | POA: Diagnosis not present

## 2021-05-18 DIAGNOSIS — Z792 Long term (current) use of antibiotics: Secondary | ICD-10-CM | POA: Diagnosis not present

## 2021-05-18 DIAGNOSIS — Z430 Encounter for attention to tracheostomy: Secondary | ICD-10-CM | POA: Diagnosis not present

## 2021-05-18 DIAGNOSIS — F419 Anxiety disorder, unspecified: Secondary | ICD-10-CM | POA: Diagnosis not present

## 2021-05-18 DIAGNOSIS — Z4781 Encounter for orthopedic aftercare following surgical amputation: Secondary | ICD-10-CM | POA: Diagnosis not present

## 2021-05-18 DIAGNOSIS — I739 Peripheral vascular disease, unspecified: Secondary | ICD-10-CM | POA: Diagnosis not present

## 2021-05-18 DIAGNOSIS — E1022 Type 1 diabetes mellitus with diabetic chronic kidney disease: Secondary | ICD-10-CM | POA: Diagnosis not present

## 2021-05-18 DIAGNOSIS — E119 Type 2 diabetes mellitus without complications: Secondary | ICD-10-CM | POA: Diagnosis not present

## 2021-05-18 DIAGNOSIS — T8149XD Infection following a procedure, other surgical site, subsequent encounter: Secondary | ICD-10-CM | POA: Diagnosis not present

## 2021-05-18 DIAGNOSIS — F32A Depression, unspecified: Secondary | ICD-10-CM | POA: Diagnosis not present

## 2021-05-18 DIAGNOSIS — N1832 Chronic kidney disease, stage 3b: Secondary | ICD-10-CM | POA: Diagnosis not present

## 2021-05-18 DIAGNOSIS — T148XXD Other injury of unspecified body region, subsequent encounter: Secondary | ICD-10-CM | POA: Diagnosis not present

## 2021-05-19 DIAGNOSIS — E1159 Type 2 diabetes mellitus with other circulatory complications: Secondary | ICD-10-CM | POA: Diagnosis not present

## 2021-05-19 DIAGNOSIS — E1029 Type 1 diabetes mellitus with other diabetic kidney complication: Secondary | ICD-10-CM | POA: Diagnosis not present

## 2021-05-19 DIAGNOSIS — R809 Proteinuria, unspecified: Secondary | ICD-10-CM | POA: Diagnosis not present

## 2021-05-19 DIAGNOSIS — I152 Hypertension secondary to endocrine disorders: Secondary | ICD-10-CM | POA: Diagnosis not present

## 2021-05-19 DIAGNOSIS — E785 Hyperlipidemia, unspecified: Secondary | ICD-10-CM | POA: Diagnosis not present

## 2021-05-19 DIAGNOSIS — E1069 Type 1 diabetes mellitus with other specified complication: Secondary | ICD-10-CM | POA: Diagnosis not present

## 2021-05-20 DIAGNOSIS — Z89512 Acquired absence of left leg below knee: Secondary | ICD-10-CM | POA: Diagnosis not present

## 2021-05-20 DIAGNOSIS — Z09 Encounter for follow-up examination after completed treatment for conditions other than malignant neoplasm: Secondary | ICD-10-CM | POA: Diagnosis not present

## 2021-05-25 ENCOUNTER — Other Ambulatory Visit: Payer: Self-pay

## 2021-05-25 ENCOUNTER — Encounter: Payer: HMO | Attending: Internal Medicine | Admitting: Internal Medicine

## 2021-05-25 DIAGNOSIS — T8131XA Disruption of external operation (surgical) wound, not elsewhere classified, initial encounter: Secondary | ICD-10-CM | POA: Diagnosis not present

## 2021-05-25 DIAGNOSIS — L97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: Secondary | ICD-10-CM | POA: Diagnosis not present

## 2021-05-25 DIAGNOSIS — Z89512 Acquired absence of left leg below knee: Secondary | ICD-10-CM | POA: Insufficient documentation

## 2021-05-25 DIAGNOSIS — E1029 Type 1 diabetes mellitus with other diabetic kidney complication: Secondary | ICD-10-CM | POA: Diagnosis not present

## 2021-05-25 DIAGNOSIS — R809 Proteinuria, unspecified: Secondary | ICD-10-CM | POA: Diagnosis not present

## 2021-05-25 DIAGNOSIS — Y838 Other surgical procedures as the cause of abnormal reaction of the patient, or of later complication, without mention of misadventure at the time of the procedure: Secondary | ICD-10-CM | POA: Insufficient documentation

## 2021-05-25 DIAGNOSIS — E10622 Type 1 diabetes mellitus with other skin ulcer: Secondary | ICD-10-CM | POA: Diagnosis not present

## 2021-05-25 DIAGNOSIS — E1051 Type 1 diabetes mellitus with diabetic peripheral angiopathy without gangrene: Secondary | ICD-10-CM | POA: Insufficient documentation

## 2021-05-25 NOTE — Progress Notes (Signed)
STRATTON, VILLWOCK ([id number]) Visit Report for 05/25/2021 Chief Complaint Document Details Patient Name: [name], [name]. Date of Service: 05/25/2021 10:00 AM Medical Record Number: [id number] Patient Account Number: [id number] Date of Birth/Sex: March 10, 1961 (61 y.o. M) Treating RN: Donnamarie Poag Primary Care Provider: Derinda Late Other Clinician: Referring Provider: Referral, Self Treating Provider/Extender: Yaakov Guthrie in Treatment: 0 Information Obtained from: Patient Chief Complaint Left lower extremity amputation site wound Electronic Signature(s) Signed: 05/25/2021 11:41:33 AM By: Kalman Shan DO Entered By: Kalman Shan on 05/25/2021 11:20:26 [name] ([id number]) -------------------------------------------------------------------------------- Debridement Details Patient Name: [name] Date of Service: 05/25/2021 10:00 AM Medical Record Number: [id number] Patient Account Number: [id number] Date of Birth/Sex: 04/06/1961 (61 y.o. M) Treating RN: Donnamarie Poag Primary Care Provider: Derinda Late Other Clinician: Referring Provider: Referral, Self Treating Provider/Extender: Yaakov Guthrie in Treatment: 0 Debridement Performed for Wound #1 Left Amputation Site - Below Knee Assessment: Performed By: Physician Kalman Shan, MD Debridement Type: Debridement Severity of Tissue Pre Debridement: Bone involvement without necrosis Level of Consciousness (Pre- Awake and Alert procedure): Pre-procedure Verification/Time Out Yes - 11:10 Taken: Start Time: 11:11 Pain Control: Lidocaine Total Area Debrided (L x W): 3 (cm) x 3 (cm) = 9 (cm) Tissue and other material Slough, Subcutaneous, Slough debrided: Level: Skin/Subcutaneous Tissue Debridement Description: Excisional Instrument: Curette, Forceps, Scissors Bleeding: Minimum Hemostasis Achieved: Pressure Response to Treatment: Procedure was tolerated well Level of Consciousness  (Post- Awake and Alert procedure): Post Debridement Measurements of Total Wound Length: (cm) 7.5 Width: (cm) 12 Depth: (cm) 2.9 Volume: (cm) 204.989 Character of Wound/Ulcer Post Debridement: Improved Severity of Tissue Post Debridement: Bone involvement without necrosis Post Procedure Diagnosis Same as Pre-procedure Electronic Signature(s) Signed: 05/25/2021 11:41:33 AM By: Kalman Shan DO Signed: 05/25/2021 4:37:11 PM By: Donnamarie Poag Entered By: Donnamarie Poag on 05/25/2021 11:14:24 [name] ([id number]) -------------------------------------------------------------------------------- HPI Details Patient Name: [name] Date of Service: 05/25/2021 10:00 AM Medical Record Number: [id number] Patient Account Number: [id number] Date of Birth/Sex: 1960-07-15 (61 y.o. M) Treating RN: Donnamarie Poag Primary Care Provider: Derinda Late Other Clinician: Referring Provider: Referral, Self Treating Provider/Extender: Yaakov Guthrie in Treatment: 0 History of Present Illness HPI Description: Admission 05/25/2021 [name] is a 61 year old male with a past medical history of uncontrolled type 1 diabetes on insulin with last hemoglobin A1c of 8.8, peripheral arterial disease, status post left BKA that presents to the clinic for a left BKA amputation site wound. Patient developed a diabetic foot infection on 04/01/2021 and ultimately underwent a left BKA on 04/12/2021. The wound site became infected and he underwent an IandD on 05/07/2021. The wound was left open at that time. He has been using wet-to-dry dressings for the past 3 weeks. He currently denies Systemic signs of infection. Electronic Signature(s) Signed: 05/25/2021 11:41:33 AM By: Kalman Shan DO Entered By: Kalman Shan on 05/25/2021 11:22:52 [name] ([id number]) -------------------------------------------------------------------------------- Physical Exam Details Patient Name: [name]. Date of Service: 05/25/2021 10:00 AM Medical Record Number: [id number] Patient Account Number: [id number] Date of Birth/Sex: 07-Dec-1960 (61 y.o. M) Treating RN: Donnamarie Poag Primary Care Provider: Derinda Late Other Clinician: Referring Provider: Referral, Self Treating Provider/Extender: Yaakov Guthrie in Treatment: 0 Constitutional . Psychiatric . Notes Left BKA stump with wound at the amputation site that probes close to bone. Mostly granulation tissue present with some nonviable tissue. No surrounding soft tissue infection. Serous drainage noted. Electronic Signature(s) Signed: 05/25/2021 11:41:33 AM By: Kalman Shan DO Entered By: Kalman Shan on  05/25/2021 11:23:37 JONERIC, STREIGHT ([id number]) -------------------------------------------------------------------------------- Physician Orders Details Patient Name: [name]. Date of Service: 05/25/2021 10:00 AM Medical Record Number: [id number] Patient Account Number: [id number] Date of Birth/Sex: 09-12-1960 (61 y.o. M) Treating RN: Donnamarie Poag Primary Care Provider: Derinda Late Other Clinician: Referring Provider: Referral, Self Treating Provider/Extender: Yaakov Guthrie in Treatment: 0 Verbal / Phone Orders: No Diagnosis Coding ICD-10 Coding Code Description E10.622 Type 1 diabetes mellitus with other skin ulcer Z89.512 Acquired absence of left leg below knee T81.31XA Disruption of external operation (surgical) wound, not elsewhere classified, initial encounter Follow-up Appointments o Return Appointment in 1 week. [place] for wound care. May utilize formulary equivalent dressing for wound treatment orders unless otherwise specified. Home Health Nurse may visit PRN to address patientos wound care needs. o Scheduled days for dressing changes to be completed; exception, patient has scheduled wound care visit that day. o  **Please direct any NON-WOUND related issues/requests for orders to patient's Primary Care Physician. **If current dressing causes regression in wound condition, may D/C ordered dressing product/s and apply Normal Saline Moist Dressing daily until next [place] or Other MD appointment. **Notify [facility] of regression in wound condition at [phone]. Bathing/ Shower/ Hygiene o No tub bath. Anesthetic (Use 'Patient Medications' Section for Anesthetic Order Entry) o Lidocaine applied to wound bed Edema Control - Lymphedema / Segmental Compressive Device / Other o DO YOUR BEST to sleep in the bed at night. DO NOT sleep in your recliner. Long hours of sitting in a recliner leads to swelling of the legs and/or potential wounds on your backside. Additional Orders / Instructions o Follow Nutritious Diet and Increase Protein Intake - monitor blood sugar to promote wound healing Wound Treatment Wound #1 - Amputation Site - Below Knee Wound Laterality: Left Cleanser: Dakin 16 (oz) 0.25 2 x Per Day/15 Days Discharge Instructions: Use as directed. Cleanser: Normal Saline 2 x Per QIO/96 Days Discharge Instructions: Wash your hands with soap and water. Remove old dressing, discard into plastic bag and place into trash. Cleanse the wound with Normal Saline prior to applying a clean dressing using gauze sponges, not tissues or cotton balls. Do not scrub or use excessive force. Pat dry using gauze sponges, not tissue or cotton balls. Cleanser: Wound Cleanser 2 x Per Day/15 Days Discharge Instructions: Wash your hands with soap and water. Remove old dressing, discard into plastic bag and place into trash. Cleanse the wound with Wound Cleanser prior to applying a clean dressing using gauze sponges, not tissues or cotton balls. Do not scrub or use excessive force. Pat dry using gauze sponges, not tissue or cotton balls. Primary Dressing: Gauze 2 x Per Day/15 Days Discharge  Instructions: Dakins lightly soaked gauze lightly packed-As directed: dry, moistened with saline or moistened with Dakins Solution Secondary Dressing: ABD Pad 5x9 (in/in) 2 x Per Day/15 Days Discharge Instructions: Cover with ABD pad Secured With: 4M ACE Elastic Bandage With VELCRO Brand Closure, 4 (in) 2 x Per Day/15 Days JACARI, IANNELLO ([id number]) Discharge Instructions: PRN to keep dressing in place Secured With: 4M Medipore H Soft Cloth Surgical Tape, 2x2 (in/yd) 2 x Per Day/15 Days Secured With: Kerlix Roll Sterile or Non-Sterile 6-ply 4.5x4 (yd/yd) 2 x Per Day/15 Days Discharge Instructions: Apply Kerlix as directed Patient Medications Allergies: Depakote, Plavix, gabapentin, BuSpar Notifications Medication Indication Start End Dakin's Solution 05/25/2021 DOSE 1 - miscellaneous 0.125 % solution - use in  wet to dry dressings Electronic Signature(s) Signed: 05/25/2021 11:41:33 AM By: Kalman Shan DO Signed: 05/25/2021 4:37:11 PM By: Donnamarie Poag Previous Signature: 05/25/2021 11:24:39 AM Version By: Kalman Shan DO Entered By: Donnamarie Poag on 05/25/2021 11:41:17 [name] ([id number]) -------------------------------------------------------------------------------- Problem List Details Patient Name: DEEN, DEGUIA. Date of Service: 05/25/2021 10:00 AM Medical Record Number: [id number] Patient Account Number: [id number] Date of Birth/Sex: Nov 29, 1960 (61 y.o. 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 M) Treating RN: Donnamarie Poag Primary Care Provider: Derinda Late Other Clinician: Referring Provider: Referral, Self Treating Provider/Extender: Yaakov Guthrie in Treatment: 0 Subjective Chief Complaint Information obtained from Patient Left lower extremity amputation site wound History of Present Illness (HPI) Admission 05/25/2021 Mr. Juanantonio Stolar is a 61 year old male with a past medical history of uncontrolled type 1 diabetes on insulin with last hemoglobin A1c of 8.8, peripheral arterial disease, status post left BKA that presents to the clinic for a left BKA amputation site wound. Patient developed a diabetic foot infection on 04/01/2021 and ultimately underwent a left BKA on 04/12/2021. The wound site became infected and he underwent an IandD on 05/07/2021. The wound was left open at that time. He has been using wet-to-dry dressings for the past 3 weeks. He currently denies Systemic signs of infection. Patient History Information obtained from Patient. Allergies Depakote (Severity: Moderate), BuSpar (Severity: Mild), Plavix (Severity: Moderate), gabapentin (Severity: Moderate) Social History Former smoker - ended on 05/08/2013, Marital Status - Married, Alcohol Use - Never, Drug Use - No History, Caffeine Use - Rarely. Medical History Cardiovascular Patient has history  of Coronary Artery Disease, Hypertension Endocrine Patient has history of Type I Diabetes Neurologic Denies history of Seizure Disorder Patient is treated with Insulin. Blood sugar is tested. Blood sugar results noted at the following times: Breakfast - 138, Lunch - 85. Review of Systems (ROS) Eyes Denies complaints or symptoms of Dry Eyes, Vision Changes, Glasses / Contacts. Ear/Nose/Mouth/Throat tracheostomy/laryngectomy Hematologic/Lymphatic Denies complaints or symptoms of Bleeding / Clotting Disorders, Human Immunodeficiency Virus. Respiratory Complains or has symptoms of Shortness of Breath. Gastrointestinal Denies complaints or symptoms of Frequent diarrhea, Nausea, Vomiting. Endocrine Complains or has symptoms of Thyroid disease. Genitourinary Denies complaints or symptoms of Kidney failure/ Dialysis, Incontinence/dribbling. Immunological Denies complaints or symptoms of Hives, Itching. Integumentary (Skin) Denies complaints or symptoms of Wounds, Bleeding or bruising tendency, Breakdown, Swelling. Musculoskeletal Denies complaints or symptoms of Muscle Pain, Muscle Weakness. Neurologic Denies complaints or symptoms of Numbness/parasthesias, Focal/Weakness. Psychiatric Complains or has symptoms of Anxiety, hx suicide attempt TYLIK, TREESE ([id number]) Objective Constitutional Vitals Time Taken: 10:20 AM, Holder: 71 in, Source: Stated, Weight: 250 lbs, Source: Stated, BMI: 34.9, Temperature: 98.3 F, Pulse:  57 bpm, Respiratory Rate: 18 breaths/min, Blood Pressure: 149/75 mmHg. General Notes: stated last hospital weight/unable to stand on scales with LBKA General Notes: Left BKA stump with wound at the amputation site that probes close to bone. Mostly granulation tissue present with some nonviable tissue. No surrounding soft tissue infection. Serous drainage noted. Integumentary (Hair, Skin) Wound #1 status is Open. Original cause of wound was Surgical Injury. The date  acquired was: 04/12/2021. The wound is located on the Left Amputation Site - Below Knee. The wound measures 7.5cm length x 12cm width x 2.9cm depth; 70.686cm^2 area and 204.989cm^3 volume. There is bone, muscle, tendon, and Fat Layer (Subcutaneous Tissue) exposed. There is no undermining noted, however, there is tunneling at 1:00 with a maximum distance of 4cm. There is additional tunneling at 11:00 with a maximum distance of 3.5cm, and at 12:00 with a maximum distance of 3.5cm. There is a large amount of serosanguineous drainage noted. There is large (67-100%) red, pink granulation within the wound bed. There is a small (1-33%) amount of necrotic tissue within the wound bed including Eschar and Adherent Slough. Assessment Active Problems ICD-10 Acquired absence of left leg below knee Disruption of external operation (surgical) wound, not elsewhere classified, initial encounter Type 1 diabetes mellitus with other skin ulcer Patient presents with a 1 month history of nonhealing wound to the left BKA amputation site. Patient had a left BKA on 04/12/2021 that unfortunately got infected and required further wound debridement in the OR on 05/07/2021. He has been using wet-to-dry dressings. There was nonviable tissue on exam. I debrided this and recommended Dakin's wet-to-dry dressings. Unfortunately the wound goes very close to bone. Ideally we would put a wound VAC once the wound bed appears cleaner And there is no concern for osteomyelitis. 47 minutes was spent on the encounter including face-to-face, EMR review and coordination of care. Procedures Wound #1 Pre-procedure diagnosis of Wound #1 is an Open Surgical Wound located on the Left Amputation Site - Below Knee .Severity of Tissue Pre Debridement is: Bone involvement without necrosis. There was a Excisional Skin/Subcutaneous Tissue Debridement with a total area of 9 sq cm performed by Kalman Shan, MD. With the following instrument(s):  Curette, Forceps, and Scissors Material removed includes Subcutaneous Tissue and Slough and after achieving pain control using Lidocaine. A time out was conducted at 11:10, prior to the start of the procedure. A Minimum amount of bleeding was controlled with Pressure. The procedure was tolerated well. Post Debridement Measurements: 7.5cm length x 12cm width x 2.9cm depth; 204.989cm^3 volume. Character of Wound/Ulcer Post Debridement is improved. Severity of Tissue Post Debridement is: Bone involvement without necrosis. Post procedure Diagnosis Wound #1: Same as Pre-Procedure Plan Follow-up Appointments: Return Appointment in 1 week. Home Health: [place]: - [place] for wound care. May utilize formulary equivalent dressing for wound treatment orders unless otherwise specified. Home Health Nurse may visit PRN to address patient s wound care needs. Scheduled days for dressing changes to be completed; exception, patient has scheduled wound care visit that day. MAISEN, KLINGLER ([id number]) **Please direct any NON-WOUND related issues/requests for orders to patient's Primary Care Physician. **If current dressing causes regression in wound condition, may D/C ordered dressing product/s and apply Normal Saline Moist Dressing daily until next [place] or Other MD appointment. **Notify W[facility]aling Center of regression in wound condition at [phone]. Bathing/ Shower/ Hygiene: No tub bath. Anesthetic (Use 'Patient Medications' Section for Anesthetic Order Entry): Lidocaine applied to wound  bed Edema Control - Lymphedema / Segmental Compressive Device / Other: DO YOUR BEST to sleep in the bed at night. DO NOT sleep in your recliner. Long hours of sitting in a recliner leads to swelling of the legs and/or potential wounds on your backside. Additional Orders / Instructions: Follow Nutritious Diet and Increase Protein Intake - monitor blood sugar to promote  wound healing The following medication(s) was prescribed: Dakin's Solution miscellaneous 0.125 % solution 1 use in wet to dry dressings starting 05/25/2021 WOUND #1: - Amputation Site - Below Knee Wound Laterality: Left Cleanser: Dakin 16 (oz) 0.25 2 x Per Day/15 Days Discharge Instructions: Use as directed. Cleanser: Normal Saline 2 x Per TTS/17 Days Discharge Instructions: Wash your hands with soap and water. Remove old dressing, discard into plastic bag and place into trash. Cleanse the wound with Normal Saline prior to applying a clean dressing using gauze sponges, not tissues or cotton balls. Do not scrub or use excessive force. Pat dry using gauze sponges, not tissue or cotton balls. Cleanser: Wound Cleanser 2 x Per Day/15 Days Discharge Instructions: Wash your hands with soap and water. Remove old dressing, discard into plastic bag and place into trash. Cleanse the wound with Wound Cleanser prior to applying a clean dressing using gauze sponges, not tissues or cotton balls. Do not scrub or use excessive force. Pat dry using gauze sponges, not tissue or cotton balls. Primary Dressing: Gauze 2 x Per Day/15 Days Discharge Instructions: Dakins lightly soaked gauze lightly packed-As directed: dry, moistened with saline or moistened with Dakins Solution Secondary Dressing: ABD Pad 5x9 (in/in) 2 x Per Day/15 Days Discharge Instructions: Cover with ABD pad Secured With: 22M Medipore H Soft Cloth Surgical Tape, 2x2 (in/yd) 2 x Per Day/15 Days Secured With: Kerlix Roll Sterile or Non-Sterile 6-ply 4.5x4 (yd/yd) 2 x Per Day/15 Days Discharge Instructions: Apply Kerlix as directed 1. In office sharp debridement 2. Dakin's wet-to-dry 3. Follow-up in 1 week Electronic Signature(s) Signed: 05/25/2021 11:41:33 AM By: Kalman Shan DO Entered By: Kalman Shan on 05/25/2021 11:39:30 [name]  ([id number]) -------------------------------------------------------------------------------- ROS/PFSH Details Patient Name: [name] Date of Service: 05/25/2021 10:00 AM Medical Record Number: [id number] Patient Account Number: [id number] Date of Birth/Sex: 1961-02-02 (61 y.o. M) Treating RN: Donnamarie Poag Primary Care Provider: Derinda Late Other Clinician: Referring Provider: Referral, Self Treating Provider/Extender: Yaakov Guthrie in Treatment: 0 Information Obtained From Patient Eyes Complaints and Symptoms: Negative for: Dry Eyes; Vision Changes; Glasses / Contacts Hematologic/Lymphatic Complaints and Symptoms: Negative for: Bleeding / Clotting Disorders; Human Immunodeficiency Virus Respiratory Complaints and Symptoms: Positive for: Shortness of Breath Gastrointestinal Complaints and Symptoms: Negative for: Frequent diarrhea; Nausea; Vomiting Endocrine Complaints and Symptoms: Positive for: Thyroid disease Medical History: Positive for: Type I Diabetes Time with diabetes: 71 years Treated with: Insulin Blood sugar tested every day: Yes Tested : daily Blood sugar testing results: Breakfast: 138; Lunch: 85 Genitourinary Complaints and Symptoms: Negative for: Kidney failure/ Dialysis; Incontinence/dribbling Immunological Complaints and Symptoms: Negative for: Hives; Itching Integumentary (Skin) Complaints and Symptoms: Negative for: Wounds; Bleeding or bruising tendency; Breakdown; Swelling Musculoskeletal Complaints and Symptoms: Negative for: Muscle Pain; Muscle Weakness Neurologic Che[place]ront, Lamont G. ([id number]) Complaints and Symptoms: Negative for: Numbness/parasthesias; Focal/Weakness Medical History: Negative for: Seizure Disorder Psychiatric Complaints and Symptoms: Positive for: Anxiety Review of System Notes: hx suicide attempt Ear/Nose/Mouth/Throat Complaints and Symptoms: Review of System  Notes: tracheostomy/laryngectomy Cardiovascular Medical History: Positive for: Coronary Artery Disease; Hypertension Oncologic Immunizations Pneumococcal Vaccine: Received Pneumococcal Vaccination: No Implantable  Devices None Family and Social History Former smoker - ended on 05/08/2013; Marital Status - Married; Alcohol Use: Never; Drug Use: No History; Caffeine Use: Rarely; Financial Concerns: No; Food, Clothing or Shelter Needs: No; Support System Lacking: No; Transportation Concerns: No Electronic Signature(s) Signed: 05/25/2021 11:41:33 AM By: Kalman Shan DO Signed: 05/25/2021 4:37:11 PM By: Donnamarie Poag Entered By: Donnamarie Poag on 05/25/2021 10:36:51 [name] ([id number]) -------------------------------------------------------------------------------- SuperBill Details Patient Name: [name] Date of Service: 05/25/2021 Medical Record Number: [id number] Patient Account Number: [id number] Date of Birth/Sex: 1960/07/08 (61 y.o. 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 Facility Procedures CPT4 Code: [id number] Description: [place] VISIT-LEV 3 EST PT Modifier: Quantity: 1 CPT4 Code: [id number] Description: 11042 - DEB SUBQ TISSUE 20 SQ CM/< Modifier: Quantity: 1 CPT4 Code: Description: ICD-10 Diagnosis Description T81.31XA Disruption of external operation (surgical) wound, not elsewhere classified Modifier: , initial  encounter Quantity: Physician Procedures CPT4: Description Modifier Quantity Code [id number] 58850 - WC PHYS LEVEL 4 - NEW PT 1 CPT4: ICD-10 Diagnosis Description 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

## 2021-05-25 NOTE — Progress Notes (Signed)
HAIDER, HORNADAY ([id number]) Visit Report for 05/25/2021 Abuse/Suicide Risk Screen Details Patient Name: [name], [name]. Date of Service: 05/25/2021 10:00 AM Medical Record Number: [id number] Patient Account Number: [id number] Date of Birth/Sex: 1960-06-03 (61 y.o. M) Treating RN: Donnamarie Poag Primary Care Ashayla Subia: Derinda Late Other Clinician: Referring Keller Bounds: Referral, Self Treating Zygmund Passero/Extender: Yaakov Guthrie in Treatment: 0 Abuse/Suicide Risk Screen Items Answer ABUSE RISK SCREEN: Has anyone close to you tried to hurt or harm you recentlyo No Do you feel uncomfortable with anyone in your familyo No Has anyone forced you do things that you didnot want to doo No Electronic Signature(s) Signed: 05/25/2021 4:37:11 PM By: Donnamarie Poag Entered By: Donnamarie Poag on 05/25/2021 10:37:01 [name] ([id number]) -------------------------------------------------------------------------------- Activities of Daily Living Details Patient Name: [name]. Date of Service: 05/25/2021 10:00 AM Medical Record Number: [id number] Patient Account Number: [id number] Date of Birth/Sex: 1961/03/18 (61 y.o. M) Treating RN: Donnamarie Poag Primary Care Mansel Strother: Derinda Late Other Clinician: Referring Traylon Schimming: Referral, Self Treating Jovani Colquhoun/Extender: Yaakov Guthrie in Treatment: 0 Activities of Daily Living Items Answer Activities of Daily Living (Please select one for each item) Drive Automobile Completely Able Take Medications Completely Able Use Telephone Completely Able Care for Appearance Need Assistance Use Toilet Need Assistance Bath / Shower Need Assistance Dress Self Need Assistance Feed Self Completely Able Walk Not Able Get In / Out Bed Need Assistance Housework Not Able Prepare Meals Need Assistance Handle Money Completely Able Shop for Self Need Assistance Notes no prosthesis yet for left leg Electronic Signature(s) Signed: 05/25/2021 4:37:11 PM  By: Donnamarie Poag Entered By: Donnamarie Poag on 05/25/2021 10:38:02 [name] ([id number]) -------------------------------------------------------------------------------- Education Screening Details Patient Name: [name] Date of Service: 05/25/2021 10:00 AM Medical Record Number: [id number] Patient Account Number: [id number] Date of Birth/Sex: October 22, 1960 (61 y.o. M) Treating RN: Donnamarie Poag Primary Care Deashia Soule: Derinda Late Other Clinician: Referring Arun Herrod: Referral, Self Treating Aylen Stradford/Extender: Yaakov Guthrie in Treatment: 0 Primary Learner Assessed: Patient Learning Preferences/Education Level/Primary Language Learning Preference: Explanation Highest Education Level: College or Above Preferred Language: English Cognitive Barrier Language Barrier: No Translator Needed: No Memory Deficit: No Emotional Barrier: No Cultural/Religious Beliefs Affecting Medical Care: No Physical Barrier Impaired Vision: No Impaired Hearing: No Decreased Hand dexterity: No Knowledge/Comprehension Knowledge Level: High Comprehension Level: High Ability to understand written instructions: High Ability to understand verbal instructions: High Motivation Anxiety Level: Calm Cooperation: Cooperative Education Importance: Acknowledges Need Interest in Health Problems: Asks Questions Perception: Coherent Willingness to Engage in Self-Management High Activities: Readiness to Engage in Self-Management High Activities: Electronic Signature(s) Signed: 05/25/2021 4:37:11 PM By: Donnamarie Poag Entered By: Donnamarie Poag on 05/25/2021 10:38:30 [name] ([id number]) -------------------------------------------------------------------------------- Fall Risk Assessment Details Patient Name: [name] Date of Service: 05/25/2021 10:00 AM Medical Record Number: [id number] Patient Account Number: [id number] Date of Birth/Sex: February 09, 1961 (61 y.o. M) Treating RN: Donnamarie Poag Primary Care Alysha Doolan: Derinda Late Other Clinician: Referring Jillian Warth: Referral, Self Treating Stanislaus Kaltenbach/Extender: Yaakov Guthrie in Treatment: 0 Fall Risk Assessment Items Have you had 2 or more falls in the last 12 monthso 0 Yes Have you had any fall that resulted in injury in the last 12 monthso 0 Yes FALLS RISK SCREEN History of falling - immediate or within 3 months 25 Yes Secondary diagnosis (Do you have 2 or more medical diagnoseso) 15 Yes Ambulatory aid None/bed rest/wheelchair/nurse 0 Yes Crutches/cane/walker 0 No Furniture 0 No Intravenous therapy Access/Saline/Heparin Lock 0 No Gait/Transferring Normal/ bed rest/ wheelchair 0 Yes  Weak (short steps with or without shuffle, stooped but able to lift head while walking, may 0 No seek support from furniture) Impaired (short steps with shuffle, may have difficulty arising from chair, head down, impaired 0 No balance) Mental Status Oriented to own ability 0 No Electronic Signature(s) Signed: 05/25/2021 4:37:11 PM By: Donnamarie Poag Entered By: Donnamarie Poag on 05/25/2021 10:39:17 [name] ([id number]) -------------------------------------------------------------------------------- Foot Assessment Details Patient Name: [name] Date of Service: 05/25/2021 10:00 AM Medical Record Number: [id number] Patient Account Number: [id number] Date of Birth/Sex: Jan 16, 1961 (61 y.o. M) Treating RN: Donnamarie Poag Primary Care Lashae Wollenberg: Derinda Late Other Clinician: Referring Shanetta Nicolls: Referral, Self Treating Tarell Schollmeyer/Extender: Yaakov Guthrie in Treatment: 0 Foot Assessment Items Site Locations + = Sensation present, - = Sensation absent, C = Callus, U = Ulcer R = Redness, W = Warmth, M = Maceration, PU = Pre-ulcerative lesion F = Fissure, S = Swelling, D = Dryness Assessment Right: Left: Other Deformity: No No Prior Foot Ulcer: No No Prior Amputation: No Yes Charcot Joint: No No Ambulatory Status:  Non-ambulatory Assistance Device: Wheelchair Gait: Electronic Signature(s) Signed: 05/25/2021 4:37:11 PM By: Donnamarie Poag Entered By: Donnamarie Poag on 05/25/2021 10:40:21 [name] ([id number]) -------------------------------------------------------------------------------- Nutrition Risk Screening Details Patient Name: [name] Date of Service: 05/25/2021 10:00 AM Medical Record Number: [id number] Patient Account Number: [id number] Date of Birth/Sex: 1960/05/28 (61 y.o. M) Treating RN: Donnamarie Poag Primary Care Chanae Gemma: Derinda Late Other Clinician: Referring Adisa Vigeant: Referral, Self Treating Stefani Baik/Extender: Yaakov Guthrie in Treatment: 0 Holder (in): 71 Weight (lbs): 250 Body Mass Index (BMI): 34.9 Nutrition Risk Screening Items Score Screening NUTRITION RISK SCREEN: I have an illness or condition that made me change the kind and/or amount of food I eat 0 No I eat fewer than two meals per day 0 No I eat few fruits and vegetables, or milk products 0 No I have three or more drinks of beer, liquor or wine almost every day 0 No I have tooth or mouth problems that make it hard for me to eat 0 No I don't always have enough money to buy the food I need 0 No I eat alone most of the time 0 No I take three or more different prescribed or over-the-counter drugs a day 1 Yes Without wanting to, I have lost or gained 10 pounds in the last six months 0 No I am not always physically able to shop, cook and/or feed myself 0 No Nutrition Protocols Good Risk Protocol 0 No interventions needed Moderate Risk Protocol High Risk Proctocol Risk Level: Good Risk Score: 1 Electronic Signature(s) Signed: 05/25/2021 4:37:11 PM By: Donnamarie Poag Entered ByDonnamarie Poag on 05/25/2021 10:39:25

## 2021-05-25 NOTE — Progress Notes (Signed)
[name], [name] ([id number]) Visit Report for 05/25/2021 Allergy List Details Patient Name: [name], [name]. Date of Service: 05/25/2021 10:00 AM Medical Record Number: [id number] Patient Account Number: [id number] Date of Birth/Sex: 1960/07/25 (61 y.o. M) Treating RN: Donnamarie Poag Primary Care Goldie Tregoning: Derinda Late Other Clinician: Referring Alexis Mizuno: Referral, Self Treating Larken Urias/Extender: Yaakov Guthrie in Treatment: 0 Allergies Active Allergies Depakote Severity: Moderate BuSpar Severity: Mild Plavix Severity: Moderate gabapentin Severity: Moderate Allergy Notes Electronic Signature(s) Signed: 05/25/2021 4:37:11 PM By: Donnamarie Poag Entered By: Donnamarie Poag on 05/25/2021 10:31:31 [name] ([id number]) -------------------------------------------------------------------------------- Arrival Information Details Patient Name: [name], [name] Date of Service: 05/25/2021 10:00 AM Medical Record Number: [id number] Patient Account Number: [id number] Date of Birth/Sex: 13-Sep-1960 (61 y.o. M) Treating RN: Donnamarie Poag Primary Care Ellamay Fors: Derinda Late Other Clinician: Referring Caston Coopersmith: Referral, Self Treating Bibiana Gillean/Extender: Yaakov Guthrie in Treatment: 0 Visit Information Patient Arrived: Wheel Chair Arrival Time: 10:20 Accompanied By: MOTHER Transfer Assistance: None Patient Identification Verified: Yes Secondary Verification Process Completed: Yes Patient Requires Transmission-Based No Precautions: Patient Has Alerts: Yes Patient Alerts: Patient on Blood Thinner Eliquis Diabetic Electronic Signature(s) Signed: 05/25/2021 4:37:11 PM By: Donnamarie Poag Entered By: Donnamarie Poag on 05/25/2021 10:25:17 [name] ([id number]) -------------------------------------------------------------------------------- Clinic Level of Care Assessment Details Patient Name: [name] Date of Service: 05/25/2021 10:00 AM Medical Record Number:  [id number] Patient Account Number: [id number] Date of Birth/Sex: [birth year] (61 y.o. M) Treating RN: Donnamarie Poag Primary Care Adaliah Hiegel: Derinda Late Other Clinician: Referring Darin Redmann: Referral, Self Treating Isella Slatten/Extender: Yaakov Guthrie in Treatment: 0 Clinic Level of Care Assessment Items TOOL 1 Quantity Score []  - Use when EandM and Procedure is performed on INITIAL visit 0 ASSESSMENTS - Nursing Assessment / Reassessment X - General Physical Exam (combine w/ comprehensive assessment (listed just below) when performed on new 1 20 pt. evals) X- 1 25 Comprehensive Assessment (HX, ROS, Risk Assessments, Wounds Hx, etc.) ASSESSMENTS - Wound and Skin Assessment / Reassessment []  - Dermatologic / Skin Assessment (not related to wound area) 0 ASSESSMENTS - Ostomy and/or Continence Assessment and Care []  - Incontinence Assessment and Management 0 []  - 0 Ostomy Care Assessment and Management (repouching, etc.) PROCESS - Coordination of Care X - Simple Patient / Family Education for ongoing care 1 15 []  - 0 Complex (extensive) Patient / Family Education for ongoing care X- 1 10 Staff obtains Programmer, systems, Records, Test Results / Process Orders X- 1 10 Staff telephones HHA, Nursing Homes / Clarify orders / etc []  - 0 Routine Transfer to another Facility (non-emergent condition) []  - 0 Routine Hospital Admission (non-emergent condition) X- 1 15 New Admissions / Biomedical engineer / Ordering NPWT, Apligraf, etc. []  - 0 Emergency Hospital Admission (emergent condition) PROCESS - Special Needs []  - Pediatric / Minor Patient Management 0 []  - 0 Isolation Patient Management []  - 0 Hearing / Language / Visual special needs []  - 0 Assessment of Community assistance (transportation, D/C planning, etc.) []  - 0 Additional assistance / Altered mentation []  - 0 Support Surface(s) Assessment (bed, cushion, seat, etc.) INTERVENTIONS - Miscellaneous []  - External ear exam  0 []  - 0 Patient Transfer (multiple staff / Civil Service fast streamer / Similar devices) []  - 0 Simple Staple / Suture removal (25 or less) []  - 0 Complex Staple / Suture removal (26 or more) []  - 0 Hypo/Hyperglycemic Management (do not check if billed separately) []  - 0 Ankle / Brachial Index (ABI) - do not check if billed separately Has the patient been seen  at the hospital within the last three years: Yes Total Score: 95 Level Of Care: New/Established - Level 3 [name], [name] ([id number]) Electronic Signature(s) Signed: 05/25/2021 4:37:11 PM By: Donnamarie Poag Entered By: Donnamarie Poag on 05/25/2021 11:36:16 [name] ([id number]) -------------------------------------------------------------------------------- Encounter Discharge Information Details Patient Name: [name], [name]. Date of Service: 05/25/2021 10:00 AM Medical Record Number: [id number] Patient Account Number: [id number] Date of Birth/Sex: 1960-05-23 (61 y.o. M) Treating RN: Donnamarie Poag Primary Care Aloria Looper: Derinda Late Other Clinician: Referring Sunni Richardson: Referral, Self Treating Sumayyah Custodio/Extender: Yaakov Guthrie in Treatment: 0 Encounter Discharge Information Items Post Procedure Vitals Discharge Condition: Stable Temperature (F): 98.3 Ambulatory Status: Wheelchair Pulse (bpm): 90 Discharge Destination: Home Respiratory Rate (breaths/min): 18 Transportation: Private Auto Blood Pressure (mmHg): 139/79 Accompanied By: mother Schedule Follow-up Appointment: Yes Clinical Summary of Care: Electronic Signature(s) Signed: 05/25/2021 4:37:11 PM By: Donnamarie Poag Entered By: Donnamarie Poag on 05/25/2021 11:38:07 [name] ([id number]) -------------------------------------------------------------------------------- Lower Extremity Assessment Details Patient Name: [name] Date of Service: 05/25/2021 10:00 AM Medical Record Number: [id number] Patient Account Number: [id number] Date of Birth/Sex: 10/14/1960 (61  y.o. M) Treating RN: Donnamarie Poag Primary Care Tressie Ragin: Derinda Late Other Clinician: Referring Oaklen Thiam: Referral, Self Treating Orlena Garmon/Extender: Yaakov Guthrie in Treatment: 0 Edema Assessment Assessed: [Left: Yes] [Right: No] Edema: [Left: N] [Right: o] Notes Left BKA Electronic Signature(s) Signed: 05/25/2021 4:37:11 PM By: Donnamarie Poag Entered By: Donnamarie Poag on 05/25/2021 10:55:13 [name] ([id number]) -------------------------------------------------------------------------------- Multi Wound Chart Details Patient Name: [name] Date of Service: 05/25/2021 10:00 AM Medical Record Number: [id number] Patient Account Number: [id number] Date of Birth/Sex: 19-Mar-1961 (61 y.o. M) Treating RN: Donnamarie Poag Primary Care Vito [name]: Derinda Late Other Clinician: Referring Boone Gear: Referral, Self Treating Charmaine Placido/Extender: Yaakov Guthrie in Treatment: 0 Vital Signs Holder(in): 71 Pulse(bpm): 50 Weight(lbs): 250 Blood Pressure(mmHg): 149/75 Body Mass Index(BMI): 35 Temperature(F): 98.3 Respiratory Rate(breaths/min): 18 Photos: [N/A:N/A] Wound Location: Left Amputation Site - Below Knee N/A N/A Wounding Event: Surgical Injury N/A N/A Primary Etiology: Open Surgical Wound N/A N/A Secondary Etiology: Diabetic Wound/Ulcer of the Lower N/A N/A Extremity Comorbid History: Coronary Artery Disease, N/A N/A Hypertension, Type I Diabetes Date Acquired: 04/12/2021 N/A N/A Weeks of Treatment: 0 N/A N/A Wound Status: Open N/A N/A Measurements L x W x D (cm) 7.5x12x2.9 N/A N/A Area (cm) : 70.686 N/A N/A Volume (cm) : 204.989 N/A N/A % Reduction in Area: 0.00% N/A N/A % Reduction in Volume: 0.00% N/A N/A Position 1 (o'clock): 1 Maximum Distance 1 (cm): 4 Position 2 (o'clock): 11 Maximum Distance 2 (cm): 3.5 Position 3 (o'clock): 12 Maximum Distance 3 (cm): 3.5 Tunneling: Yes N/A N/A Classification: Full Thickness With Exposed N/A N/A Support  Structures Exudate Amount: Large N/A N/A Exudate Type: Serosanguineous N/A N/A Exudate Color: red, brown N/A N/A Granulation Amount: Large (67-100%) N/A N/A Granulation Quality: Red, Pink N/A N/A Necrotic Amount: Small (1-33%) N/A N/A Necrotic Tissue: Eschar, Adherent Slough N/A N/A Exposed Structures: Fat Layer (Subcutaneous Tissue): N/A N/A Yes Tendon: Yes Muscle: Yes Bone: Yes Fascia: No Joint: No Debridement: Debridement - Excisional N/A N/A Pre-procedure Verification/Time 11:10 N/A N/A Out Taken: Pain Control: Lidocaine N/A N/A [name], [name] ([id number]) Tissue Debrided: Subcutaneous, Slough N/A N/A Level: Skin/Subcutaneous Tissue N/A N/A Debridement Area (sq cm): 9 N/A N/A Instrument: Curette, Forceps, Scissors N/A N/A Bleeding: Minimum N/A N/A Hemostasis Achieved: Pressure N/A N/A Debridement Treatment Procedure was tolerated well N/A N/A Response: Post Debridement 7.5x12x2.9 N/A N/A Measurements L x W x D (cm)  Post Debridement Volume: 204.989 N/A N/A (cm) Procedures Performed: Debridement N/A N/A Treatment Notes Electronic Signature(s) Signed: 05/25/2021 11:41:33 AM By: Kalman Shan DO Entered By: Kalman Shan on 05/25/2021 11:20:02 [name] ([id number]) -------------------------------------------------------------------------------- Multi-Disciplinary Care Plan Details Patient Name: [name], [name] Date of Service: 05/25/2021 10:00 AM Medical Record Number: [id number] Patient Account Number: [id number] Date of Birth/Sex: Mar 05, 1961 (61 y.o. M) Treating RN: Donnamarie Poag Primary Care [name] [name]: Derinda Late Other Clinician: Referring Aaniya Sterba: Referral, Self Treating Babatunde Seago/Extender: Yaakov Guthrie in Treatment: 0 Active Inactive Orientation to the Wound Care Program Nursing Diagnoses: Knowledge deficit related to the wound healing center program Goals: Patient/caregiver will verbalize understanding of the [place]  Program Date Initiated: 05/25/2021 Target Resolution Date: 06/03/2021 Goal Status: Active Interventions: Provide education on orientation to the wound center Notes: Wound/Skin Impairment Nursing Diagnoses: Impaired tissue integrity Knowledge deficit related to smoking impact on wound healing Knowledge deficit related to ulceration/compromised skin integrity Goals: Patient/caregiver will verbalize understanding of skin care regimen Date Initiated: 05/25/2021 Target Resolution Date: 06/03/2021 Goal Status: Active Ulcer/skin breakdown will have a volume reduction of 30% by week 4 Date Initiated: 05/25/2021 Target Resolution Date: 06/22/2021 Goal Status: Active Ulcer/skin breakdown will have a volume reduction of 50% by week 8 Date Initiated: 05/25/2021 Target Resolution Date: 07/20/2021 Goal Status: Active Ulcer/skin breakdown will have a volume reduction of 80% by week 12 Date Initiated: 05/25/2021 Target Resolution Date: 08/10/2021 Goal Status: Active Ulcer/skin breakdown will heal within 14 weeks Date Initiated: 05/25/2021 Target Resolution Date: 09/07/2021 Goal Status: Active Interventions: Assess patient/caregiver ability to obtain necessary supplies Assess patient/caregiver ability to perform ulcer/skin care regimen upon admission and as needed Assess ulceration(s) every visit Notes: Electronic Signature(s) Signed: 05/25/2021 4:37:11 PM By: Donnamarie Poag Entered By: Donnamarie Poag on 05/25/2021 11:08:54 [name] ([id number]) -------------------------------------------------------------------------------- Pain Assessment Details Patient Name: [name] Date of Service: 05/25/2021 10:00 AM Medical Record Number: [id number] Patient Account Number: [id number] Date of Birth/Sex: 1961-01-28 (61 y.o. M) Treating RN: Donnamarie Poag Primary Care Raeonna Milo: Derinda Late Other Clinician: Referring Jaeleah Smyser: Referral, Self Treating Shivaay Stormont/Extender: Yaakov Guthrie in  Treatment: 0 Active Problems Location of Pain Severity and Description of Pain Patient Has Paino Yes Site Locations Pain Location: Pain in Ulcers Rate the pain. Current Pain Level: 7 Pain Management and Medication Current Pain Management: Electronic Signature(s) Signed: 05/25/2021 4:37:11 PM By: Donnamarie Poag Entered By: Donnamarie Poag on 05/25/2021 10:29:15 [name] ([id number]) -------------------------------------------------------------------------------- Patient/Caregiver Education Details Patient Name: [name] Date of Service: 05/25/2021 10:00 AM Medical Record Number: [id number] Patient Account Number: [id number] Date of Birth/Gender: 31-Oct-1960 (61 y.o. M) Treating RN: Donnamarie Poag Primary Care Physician: Derinda Late Other Clinician: Referring Physician: Referral, Self Treating Physician/Extender: Yaakov Guthrie in Treatment: 0 Education Assessment Education Provided To: Patient and Caregiver Education Topics Provided Basic Hygiene: Elevated Blood Sugar/ Impact on Healing: Infection: Welcome To [facility] [place]: Wound Debridement: Wound/Skin Impairment: Electronic Signature(s) Signed: 05/25/2021 4:37:11 PM By: Donnamarie Poag Entered By: Donnamarie Poag on 05/25/2021 11:36:53 [name] ([id number]) -------------------------------------------------------------------------------- Wound Assessment Details Patient Name: [name] Date of Service: 05/25/2021 10:00 AM Medical Record Number: [id number] Patient Account Number: [id number] Date of Birth/Sex: [birth year] (61 y.o. 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 Etiology: Wounding Event: Surgical Injury Wound Status: Open Date Acquired: 04/12/2021 Comorbid History: Coronary Artery Disease, Hypertension, Type I Weeks Of Treatment: 0 Diabetes Clustered Wound: No Pending Amputation On Presentation Photos Wound Measurements Length: (cm) 7.5 Width: (cm) 12 Depth: (cm) 2.9 Area: (cm) 70.686 Volume: (cm) 204.989 % Reduction in Area: 0% % Reduction in Volume: 0% Tunneling: Yes Location 1 Position (o'clock): 1 Maximum Distance: (cm) 4 Location 2 Position (o'clock): 11 Maximum Distance: (cm) 3.5 Location 3 Position (o'clock): 12 Maximum Distance: (cm) 3.5 Undermining: No Wound Description Classification: Full Thickness With Exposed Support Structures Exudate Amount: Large Exudate Type: Serosanguineous Exudate Color: red, brown Foul Odor After Cleansing: No Slough/Fibrino Yes Wound Bed Granulation Amount: Large (67-100%) Exposed Structure Granulation Quality: Red, Pink Fascia Exposed: No Necrotic Amount: Small (1-33%) Fat Layer (Subcutaneous Tissue) Exposed: Yes Necrotic Quality: Eschar, Adherent Slough Tendon Exposed: Yes Muscle Exposed: Yes Necrosis of Muscle: No Joint Exposed: No Bone Exposed: Yes [name], [name] ([id number]) Assessment Notes staples dry and intact without sign of infection next to open surgical wound 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 Normal Saline Discharge Instruction: Wash your hands with soap and water. Remove old dressing, discard into plastic bag and place into trash. Cleanse the wound with Normal Saline prior to applying a clean dressing using gauze sponges, not tissues or cotton balls. Do not scrub or use excessive force. Pat dry using gauze sponges, not  tissue or cotton balls. Wound Cleanser Discharge Instruction: Wash your hands with soap and water. Remove old dressing, discard into plastic bag and place into trash. Cleanse the wound with Wound Cleanser prior to applying a clean dressing using gauze sponges, not tissues or cotton balls. Do not scrub or use excessive force. Pat dry using gauze sponges, not tissue or cotton balls. Peri-Wound Care Topical Primary Dressing Gauze Discharge Instruction: Dakins lightly soaked gauze lightly packed-As directed: dry, moistened with saline or moistened with Dakins Solution Secondary Dressing ABD Pad 5x9 (in/in) Discharge Instruction: Cover with ABD pad Secured With 79M [place] Surgical Tape, 2x2 (in/yd) Kerlix Roll Sterile or Non-Sterile 6-ply 4.5x4 (yd/yd) Discharge Instruction: Apply Kerlix as directed Compression Wrap Compression Stockings Add-Ons Electronic Signature(s) Signed: 05/25/2021 3:06:05 PM By: Donnamarie Poag Entered By: Donnamarie Poag on 05/25/2021 15:06:04 [name] ([id number]) -------------------------------------------------------------------------------- Powers Details Patient Name: [name] Date of Service: 05/25/2021 10:00 AM Medical Record Number: [id number] Patient Account Number: [id number] Date of Birth/Sex: 10/10/1960 (61 y.o. M) Treating RN: Donnamarie Poag Primary Care Trica Usery: Derinda Late Other Clinician: Referring Samya Siciliano: Referral, Self Treating Yalda Herd/Extender: Yaakov Guthrie in Treatment: 0 Vital Signs Time Taken: 10:20 Temperature (F): 98.3 Holder (in): 71 Pulse (bpm): 57 Source: Stated Respiratory Rate (breaths/min): 18 Weight (lbs): 250 Blood Pressure (mmHg): 149/75 Source: Stated Reference Range: 80 - 120 mg / dl Body Mass Index (BMI): 34.9 Notes stated last hospital weight/unable to stand on scales with [facility] Electronic Signature(s) Signed: 05/25/2021 4:37:11 PM By: Donnamarie Poag Entered ByDonnamarie Poag on  05/25/2021 10:30:01

## 2021-06-01 ENCOUNTER — Encounter (HOSPITAL_BASED_OUTPATIENT_CLINIC_OR_DEPARTMENT_OTHER): Payer: HMO | Admitting: Internal Medicine

## 2021-06-01 ENCOUNTER — Other Ambulatory Visit: Payer: Self-pay

## 2021-06-01 DIAGNOSIS — L97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: Secondary | ICD-10-CM | POA: Diagnosis not present

## 2021-06-01 DIAGNOSIS — E10622 Type 1 diabetes mellitus with other skin ulcer: Secondary | ICD-10-CM | POA: Diagnosis not present

## 2021-06-01 DIAGNOSIS — Z4781 Encounter for orthopedic aftercare following surgical amputation: Secondary | ICD-10-CM | POA: Diagnosis not present

## 2021-06-01 NOTE — Progress Notes (Signed)
DIOR, STEPTER ([id number]) Visit Report for 06/01/2021 Chief Complaint Document Details Patient Name: [name], [name]. Date of Service: 06/01/2021 11:00 AM Medical Record Number: [id number] Patient Account Number: [id number] Date of Birth/Sex: 1960-06-26 (61 y.o. M) Treating RN: Carlene Coria Primary Care Provider: Derinda Late Other Clinician: Referring Provider: Derinda Late Treating Provider/Extender: Yaakov Guthrie in Treatment: 1 Information Obtained from: Patient Chief Complaint Left lower extremity amputation site wound Electronic Signature(s) Signed: 06/01/2021 11:53:15 AM By: Kalman Shan DO Entered By: Kalman Shan on 06/01/2021 11:45:18 [name] ([id number]) -------------------------------------------------------------------------------- Debridement Details Patient Name: [name] Date of Service: 06/01/2021 11:00 AM Medical Record Number: [id number] Patient Account Number: [id number] Date of Birth/Sex: [birth year] (61 y.o. M) Treating RN: Carlene Coria Primary Care Provider: Derinda Late Other Clinician: Referring Provider: Derinda Late Treating Provider/Extender: Yaakov Guthrie in Treatment: 1 Debridement Performed for Wound #1 Left Amputation Site - Below Knee Assessment: Performed By: Physician Kalman Shan, MD Debridement Type: Debridement Severity of Tissue Pre Debridement: Fat layer exposed Level of Consciousness (Pre- Awake and Alert procedure): Pre-procedure Verification/Time Out Yes - 11:30 Taken: Start Time: 11:30 Total Area Debrided (L x W): 3 (cm) x 3 (cm) = 9 (cm) Tissue and other material Viable, Non-Viable, Eschar, Subcutaneous debrided: Level: Skin/Subcutaneous Tissue Debridement Description: Excisional Instrument: Curette Bleeding: Minimum Hemostasis Achieved: Pressure End Time: 11:33 Procedural Pain: 0 Post Procedural Pain: 0 Response to Treatment: Procedure was tolerated well Level of  Consciousness (Post- Awake and Alert procedure): Post Debridement Measurements of Total Wound Length: (cm) 5.5 Width: (cm) 14.5 Depth: (cm) 2.9 Volume: (cm) 181.643 Character of Wound/Ulcer Post Debridement: Improved Severity of Tissue Post Debridement: Fat layer exposed Post Procedure Diagnosis Same as Pre-procedure Electronic Signature(s) Signed: 06/01/2021 11:53:15 AM By: Kalman Shan DO Signed: 06/01/2021 3:33:48 PM By: Carlene Coria RN Entered By: Carlene Coria on 06/01/2021 11:32:50 [name] ([id number]) -------------------------------------------------------------------------------- HPI Details Patient Name: [name] Date of Service: 06/01/2021 11:00 AM Medical Record Number: [id number] Patient Account Number: [id number] Date of Birth/Sex: [birth year] (61 y.o. M) Treating RN: Carlene Coria Primary Care Provider: Derinda Late Other Clinician: Referring Provider: Derinda Late Treating Provider/Extender: Yaakov Guthrie in Treatment: 1 History of Present Illness HPI Description: Admission 05/25/2021 [name] is a 61 year old male with a past medical history of uncontrolled type 1 diabetes on insulin with last hemoglobin A1c of 8.8, peripheral arterial disease, status post left BKA that presents to the clinic for a left BKA amputation site wound. Patient developed a diabetic foot infection on 04/01/2021 and ultimately underwent a left BKA on 04/12/2021. The wound site became infected and he underwent an IandD on 05/07/2021. The wound was left open at that time. He has been using wet-to-dry dressings for the past 3 weeks. He currently denies Systemic signs of infection. 1/25; patient presents for follow-up. He has been using Dakin's wet-to-dry dressings with improvement in wound healing. He has no issues or complaints today. He denies signs of infection. Electronic Signature(s) Signed: 06/01/2021 11:53:15 AM By: Kalman Shan DO Entered By:  Kalman Shan on 06/01/2021 11:45:46 [name] ([id number]) -------------------------------------------------------------------------------- Physical Exam Details Patient Name: [name], [name]. Date of Service: 06/01/2021 11:00 AM Medical Record Number: [id number] Patient Account Number: [id number] Date of Birth/Sex: 1960-12-31 (61 y.o. M) Treating RN: Carlene Coria Primary Care Provider: Derinda Late Other Clinician: Referring Provider: Derinda Late Treating Provider/Extender: Yaakov Guthrie in Treatment: 1 Constitutional . Psychiatric . Notes Left BKA stump with wound at the amputation site that probes  close to bone. Mostly granulation tissue present with some nonviable tissue. No surrounding soft tissue infection. No drainage noted. Electronic Signature(s) Signed: 06/01/2021 11:53:15 AM By: Kalman Shan DO Entered By: Kalman Shan on 06/01/2021 11:46:46 [name] ([id number]) -------------------------------------------------------------------------------- Physician Orders Details Patient Name: [name] Date of Service: 06/01/2021 11:00 AM Medical Record Number: [id number] Patient Account Number: [id number] Date of Birth/Sex: Jan 31, 1961 (61 y.o. M) Treating RN: Carlene Coria Primary Care Provider: Derinda Late Other Clinician: Referring Provider: Derinda Late Treating Provider/Extender: Yaakov Guthrie in Treatment: 1 Verbal / Phone Orders: No Diagnosis Coding Follow-up Appointments o Return Appointment in 1 week. [place] for wound care. May utilize formulary equivalent dressing for wound treatment orders unless otherwise specified. Home Health Nurse may visit PRN to address patientos wound care needs. o Scheduled days for dressing changes to be completed; exception, patient has scheduled wound care visit that day. o **Please direct any NON-WOUND related  issues/requests for orders to patient's Primary Care Physician. **If current dressing causes regression in wound condition, may D/C ordered dressing product/s and apply Normal Saline Moist Dressing daily until next [place] or Other MD appointment. **Notify [facility] of regression in wound condition at [phone]. Bathing/ Shower/ Hygiene o No tub bath. Anesthetic (Use 'Patient Medications' Section for Anesthetic Order Entry) o Lidocaine applied to wound bed Edema Control - Lymphedema / Segmental Compressive Device / Other o DO YOUR BEST to sleep in the bed at night. DO NOT sleep in your recliner. Long hours of sitting in a recliner leads to swelling of the legs and/or potential wounds on your backside. Additional Orders / Instructions o Follow Nutritious Diet and Increase Protein Intake - monitor blood sugar to promote wound healing Wound Treatment Wound #1 - Amputation Site - Below Knee Wound Laterality: Left Cleanser: Dakin 16 (oz) 0.25 2 x Per Day/15 Days Discharge Instructions: Use as directed. Cleanser: Normal Saline 2 x Per LOV/56 Days Discharge Instructions: Wash your hands with soap and water. Remove old dressing, discard into plastic bag and place into trash. Cleanse the wound with Normal Saline prior to applying a clean dressing using gauze sponges, not tissues or cotton balls. Do not scrub or use excessive force. Pat dry using gauze sponges, not tissue or cotton balls. Cleanser: Wound Cleanser 2 x Per Day/15 Days Discharge Instructions: Wash your hands with soap and water. Remove old dressing, discard into plastic bag and place into trash. Cleanse the wound with Wound Cleanser prior to applying a clean dressing using gauze sponges, not tissues or cotton balls. Do not scrub or use excessive force. Pat dry using gauze sponges, not tissue or cotton balls. Primary Dressing: Gauze 2 x Per EPP/29 Days Discharge Instructions: Dakins lightly soaked gauze  lightly packed-As directed: dry, moistened with saline or moistened with Dakins Solution Secondary Dressing: ABD Pad 5x9 (in/in) 2 x Per Day/15 Days Discharge Instructions: Cover with ABD pad Secured With: 1035M ACE Elastic Bandage With VELCRO Brand Closure, 4 (in) 2 x Per Day/15 Days Discharge Instructions: PRN to keep dressing in place Secured With: 1035M Medipore H Soft Cloth Surgical Tape, 2x2 (in/yd) 2 x Per Day/15 Days Secured With: [employer] Sterile or Non-Sterile 6-ply 4.5x4 (yd/yd) 2 x Per Day/15 Days Discharge Instructions: Apply Kerlix as directed [name], [name] ([id number]) Electronic Signature(s) Signed: 06/01/2021 11:53:15 AM By: Kalman Shan DO Entered By: Kalman Shan on 06/01/2021 11:52:51 [name] ([id number]) -------------------------------------------------------------------------------- Problem [name]  Details Patient Name: [name], [name]. Date of Service: 06/01/2021 11:00 AM Medical Record Number: [id number] Patient Account Number: [id number] Date of Birth/Sex: [birth year] (61 y.o. 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 M) Treating RN: Carlene Coria Primary Care Provider: Derinda Late Other Clinician: Referring Provider: Derinda Late Treating Provider/Extender: Yaakov Guthrie in Treatment: 1 Subjective Chief Complaint Information obtained from Patient Left lower extremity amputation site wound History of Present Illness (HPI) Admission 05/25/2021 Mr. Arin Peral is a 62 year old male with a past medical history of uncontrolled type 1 diabetes on insulin with last hemoglobin A1c of 8.8, peripheral arterial disease, status post left BKA that presents to the clinic for a left BKA amputation site wound. Patient developed a diabetic foot infection on 04/01/2021 and ultimately underwent a left BKA on 04/12/2021. The wound site became infected and he underwent an IandD on 05/07/2021. The wound was left open at that time. He has been using wet-to-dry dressings for the past 3 weeks. He currently denies Systemic signs of infection. 1/25; patient presents for follow-up. He has been using Dakin's wet-to-dry dressings with improvement in wound healing. He has no issues or complaints today. He denies signs of infection. Objective Constitutional Vitals Time Taken: 11:02 AM, Holder: 71 in, Weight: 250 lbs, BMI: 34.9, Temperature: 98.4 F, Pulse: 75 bpm, Respiratory Rate: 18 breaths/min, Blood Pressure: 118/72 mmHg. General Notes: Left BKA stump with wound at the amputation site that probes close to bone. Mostly granulation tissue present with some nonviable tissue. No surrounding soft tissue infection. No drainage noted. Integumentary (Hair, Skin) Wound #1 status is Open. Original cause of wound was Surgical Injury. The  date acquired was: 04/12/2021. The wound has been in treatment 1 weeks. The wound is located on the Left Amputation Site - Below Knee. The wound measures 5.5cm length x 14.5cm width x 2.9cm depth; 62.636cm^2 area and 181.643cm^3 volume. There is bone, muscle, tendon, and Fat Layer (Subcutaneous Tissue) exposed. There is no tunneling noted, however, there is undermining starting at 9:00 and ending at 3:00 with a maximum distance of 7cm. There is a large amount of serosanguineous drainage noted. There is large (67-100%) red, pink granulation within the wound bed. There is a small (1-33%) amount of necrotic tissue within the wound bed including Eschar. 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 I debrided nonviable tissue. I recommended continuing Dakin's wet-to-dry dressings. No  surrounding signs of infection. [name], [name] ([id number]) Procedures Wound #1 Pre-procedure diagnosis of Wound #1 is an Open Surgical Wound located on the Left Amputation Site - Below Knee .Severity of Tissue Pre Debridement is: Fat layer exposed. There was a Excisional Skin/Subcutaneous Tissue Debridement with a total area of 9 sq cm performed by Kalman Shan, MD. With the following instrument(s): Curette to remove Viable and Non-Viable tissue/material. Material removed includes Eschar and Subcutaneous Tissue and. No specimens were taken. A time out was conducted at 11:30, prior to the start of the procedure. A Minimum amount of bleeding was controlled with Pressure. The procedure was tolerated well with a pain level of 0 throughout and a pain level of 0  following the procedure. Post Debridement Measurements: 5.5cm length x 14.5cm width x 2.9cm depth; 181.643cm^3 volume. Character of Wound/Ulcer Post Debridement is improved. Severity of Tissue Post Debridement is: Fat layer exposed. Post procedure Diagnosis Wound #1: Same as Pre-Procedure Plan Follow-up Appointments: Return Appointment in 1 week. Home Health: [place]: [place] for wound care. May utilize formulary equivalent dressing for wound treatment orders unless otherwise specified. Home Health Nurse may visit PRN to address patient s wound care needs. Scheduled days for dressing changes to be completed; exception, patient has scheduled wound care visit that day. **Please direct any NON-WOUND related issues/requests for orders to patient's Primary Care Physician. **If current dressing causes regression in wound condition, may D/C ordered dressing product/s and apply Normal Saline Moist Dressing daily until next [place] or Other MD appointment. **Notify [facility] of regression in wound condition at [phone]. Bathing/ Shower/ Hygiene: No tub bath. Anesthetic (Use 'Patient Medications' Section for Anesthetic Order Entry): Lidocaine applied to wound bed Edema Control - Lymphedema / Segmental Compressive Device / Other: DO YOUR BEST to sleep in the bed at night. DO NOT sleep in your recliner. Long hours of sitting in a recliner leads to swelling of the legs and/or potential wounds on your backside. Additional Orders / Instructions: Follow Nutritious Diet and Increase Protein Intake - monitor blood sugar to promote wound healing WOUND #1: - Amputation Site - Below Knee Wound Laterality: Left Cleanser: Dakin 16 (oz) 0.25 2 x Per Day/15 Days Discharge Instructions: Use as directed. Cleanser: Normal Saline 2 x Per QQP/61 Days Discharge Instructions: Wash your hands with soap and water. Remove old dressing, discard into plastic bag  and place into trash. Cleanse the wound with Normal Saline prior to applying a clean dressing using gauze sponges, not tissues or cotton balls. Do not scrub or use excessive force. Pat dry using gauze sponges, not tissue or cotton balls. Cleanser: Wound Cleanser 2 x Per Day/15 Days Discharge Instructions: Wash your hands with soap and water. Remove old dressing, discard into plastic bag and place into trash. Cleanse the wound with Wound Cleanser prior to applying a clean dressing using gauze sponges, not tissues or cotton balls. Do not scrub or use excessive force. Pat dry using gauze sponges, not tissue or cotton balls. Primary Dressing: Gauze 2 x Per Day/15 Days Discharge Instructions: Dakins lightly soaked gauze lightly packed-As directed: dry, moistened with saline or moistened with Dakins Solution Secondary Dressing: ABD Pad 5x9 (in/in) 2 x Per Day/15 Days Discharge Instructions: Cover with ABD pad Secured With: 11M ACE Elastic Bandage With VELCRO Brand Closure, 4 (in) 2 x Per Day/15 Days Discharge Instructions: PRN to keep dressing in place Secured With: 11M Medipore H Soft Cloth Surgical Tape, 2x2 (in/yd)  2 x Per Day/15 Days Secured With: Kerlix Roll Sterile or Non-Sterile 6-ply 4.5x4 (yd/yd) 2 x Per Day/15 Days Discharge Instructions: Apply Kerlix as directed 1. In office sharp debridement 2. Dakin's wet-to-dry dressings 3. Follow-up in 1 week Electronic Signature(s) Signed: 06/01/2021 11:53:15 AM By: Kalman Shan DO Entered By: Kalman Shan on 06/01/2021 11:52:09 [name] ([id number]) -------------------------------------------------------------------------------- SuperBill Details Patient Name: [name] Date of Service: 06/01/2021 Medical Record Number: [id number] Patient Account Number: [id number] Date of Birth/Sex: [birth year] (61 y.o. 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 Facility Procedures CPT4: Description Modifier Quantity Code [id number] - DEB SUBQ TISSUE 20 SQ CM/< 1 CPT4: ICD-10 Diagnosis Description L97.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

## 2021-06-01 NOTE — Progress Notes (Signed)
HERU, MONTZ ([id number]) Visit Report for 06/01/2021 Arrival Information Details Patient Name: [name], [name]. Date of Service: 06/01/2021 11:00 AM Medical Record Number: [id number] Patient Account Number: [id number] Date of Birth/Sex: [birth year] (61 y.o. M) Treating RN: Carlene Coria Primary Care Arkeem Harts: Derinda Late Other Clinician: Referring Gumaro Brightbill: Derinda Late Treating Rilan Eiland/Extender: Yaakov Guthrie in Treatment: 1 Visit Information History Since Last Visit All ordered tests and consults were completed: No Patient Arrived: Wheel Chair Added or deleted any medications: No Arrival Time: 10:58 Any new allergies or adverse reactions: No Accompanied By: mother Had a fall or experienced change in No Transfer Assistance: None activities of daily living that may affect Patient Identification Verified: Yes risk of falls: Secondary Verification Process Completed: Yes Signs or symptoms of abuse/neglect since last visito No Patient Requires Transmission-Based No Hospitalized since last visit: No Precautions: Implantable device outside of the clinic excluding No Patient Has Alerts: Yes cellular tissue based products placed in the center Patient Alerts: Patient on Blood since last visit: Thinner Has Dressing in Place as Prescribed: Yes Eliquis Pain Present Now: Yes Diabetic Electronic Signature(s) Signed: 06/01/2021 3:33:48 PM By: Carlene Coria RN Entered By: Carlene Coria on 06/01/2021 11:02:32 [name] ([id number]) -------------------------------------------------------------------------------- Clinic Level of Care Assessment Details Patient Name: [name] Date of Service: 06/01/2021 11:00 AM Medical Record Number: [id number] Patient Account Number: [id number] Date of Birth/Sex: 10/30/1960 (61 y.o. M) Treating RN: Carlene Coria Primary Care Dody Smartt: Derinda Late Other Clinician: Referring Athanasius Kesling: Derinda Late Treating Tyniesha Howald/Extender:  Yaakov Guthrie in Treatment: 1 Clinic Level of Care Assessment Items TOOL 1 Quantity Score []  - Use when EandM and Procedure is performed on INITIAL visit 0 ASSESSMENTS - Nursing Assessment / Reassessment []  - General Physical Exam (combine w/ comprehensive assessment (listed just below) when performed on new 0 pt. evals) []  - 0 Comprehensive Assessment (HX, ROS, Risk Assessments, Wounds Hx, etc.) ASSESSMENTS - Wound and Skin Assessment / Reassessment []  - Dermatologic / Skin Assessment (not related to wound area) 0 ASSESSMENTS - Ostomy and/or Continence Assessment and Care []  - Incontinence Assessment and Management 0 []  - 0 Ostomy Care Assessment and Management (repouching, etc.) PROCESS - Coordination of Care []  - Simple Patient / Family Education for ongoing care 0 []  - 0 Complex (extensive) Patient / Family Education for ongoing care []  - 0 Staff obtains Programmer, systems, Records, Test Results / Process Orders []  - 0 Staff telephones HHA, Nursing Homes / Clarify orders / etc []  - 0 Routine Transfer to another Facility (non-emergent condition) []  - 0 Routine Hospital Admission (non-emergent condition) []  - 0 New Admissions / Biomedical engineer / Ordering NPWT, Apligraf, etc. []  - 0 Emergency Hospital Admission (emergent condition) PROCESS - Special Needs []  - Pediatric / Minor Patient Management 0 []  - 0 Isolation Patient Management []  - 0 Hearing / Language / Visual special needs []  - 0 Assessment of Community assistance (transportation, D/C planning, etc.) []  - 0 Additional assistance / Altered mentation []  - 0 Support Surface(s) Assessment (bed, cushion, seat, etc.) INTERVENTIONS - Miscellaneous []  - External ear exam 0 []  - 0 Patient Transfer (multiple staff / Civil Service fast streamer / Similar devices) []  - 0 Simple Staple / Suture removal (25 or less) []  - 0 Complex Staple / Suture removal (26 or more) []  - 0 Hypo/Hyperglycemic Management (do not check if  billed separately) []  - 0 Ankle / Brachial Index (ABI) - do not check if billed separately Has the patient been seen at the hospital within  the last three years: Yes Total Score: 0 Level Of Care: ____ [name] ([id number]) Electronic Signature(s) Signed: 06/01/2021 3:33:48 PM By: Carlene Coria RN Entered By: Carlene Coria on 06/01/2021 11:34:50 [name] ([id number]) -------------------------------------------------------------------------------- Encounter Discharge Information Details Patient Name: [name], [name] Date of Service: 06/01/2021 11:00 AM Medical Record Number: [id number] Patient Account Number: [id number] Date of Birth/Sex: June 21, 1960 (61 y.o. M) Treating RN: Carlene Coria Primary Care Lenzi Marmo: Derinda Late Other Clinician: Referring Aleksei Goodlin: Derinda Late Treating Jasraj Lappe/Extender: Yaakov Guthrie in Treatment: 1 Encounter Discharge Information Items Post Procedure Vitals Discharge Condition: Stable Temperature (F): 98.4 Ambulatory Status: Ambulatory Pulse (bpm): 75 Discharge Destination: Home Respiratory Rate (breaths/min): 18 Transportation: Private Auto Blood Pressure (mmHg): 118/72 Accompanied By: mother Schedule Follow-up Appointment: Yes Clinical Summary of Care: Patient Declined Electronic Signature(s) Signed: 06/01/2021 3:33:48 PM By: Carlene Coria RN Entered By: Carlene Coria on 06/01/2021 11:36:41 [name] ([id number]) -------------------------------------------------------------------------------- Lower Extremity Assessment Details Patient Name: [name] Date of Service: 06/01/2021 11:00 AM Medical Record Number: [id number] Patient Account Number: [id number] Date of Birth/Sex: 1960/12/13 (61 y.o. M) Treating RN: Carlene Coria Primary Care Reiana Poteet: Derinda Late Other Clinician: Referring Teon Hudnall: Derinda Late Treating Aiko Belko/Extender: Yaakov Guthrie in Treatment: 1 Electronic Signature(s) Signed:  06/01/2021 3:33:48 PM By: Carlene Coria RN Entered By: Carlene Coria on 06/01/2021 11:16:05 [name] ([id number]) -------------------------------------------------------------------------------- Multi Wound Chart Details Patient Name: [name], [name]. Date of Service: 06/01/2021 11:00 AM Medical Record Number: [id number] Patient Account Number: [id number] Date of Birth/Sex: July 09, 1960 (61 y.o. 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 N/A N/A Wounding Event: Surgical Injury N/A N/A Primary Etiology: Open Surgical Wound N/A N/A Secondary Etiology: Diabetic Wound/Ulcer of the Lower N/A N/A Extremity Comorbid History: Coronary Artery Disease, N/A N/A Hypertension, Type I Diabetes Date Acquired: 04/12/2021 N/A N/A Weeks of Treatment: 1 N/A N/A Wound Status: Open N/A N/A Wound Recurrence: No N/A N/A Pending Amputation on Yes N/A N/A Presentation: Measurements L x W x D (cm) 5.5x14.5x29 N/A N/A Area (cm) : 62.636 N/A N/A Volume (cm) : 1816.43 N/A N/A % Reduction in Area: 11.40% N/A N/A % Reduction in Volume: -786.10% N/A N/A Starting Position 1 (o'clock): 9 Ending Position 1 (o'clock): 3 Maximum Distance 1 (cm): 7 Undermining: Yes N/A N/A Classification: Full Thickness With Exposed N/A N/A Support Structures Exudate Amount: Large N/A N/A Exudate Type: Serosanguineous N/A N/A Exudate Color: red, brown N/A N/A Granulation Amount: Large (67-100%) N/A N/A Granulation Quality: Red, Pink N/A N/A Necrotic Amount: Small (1-33%) N/A N/A Necrotic Tissue: Eschar, Adherent Slough N/A N/A Exposed  Structures: Fat Layer (Subcutaneous Tissue): N/A N/A Yes Tendon: Yes Muscle: Yes Bone: Yes Fascia: No Joint: No Epithelialization: None N/A N/A Treatment Notes [name], [name] ([id number]) Electronic Signature(s) Signed: 06/01/2021 3:33:48 PM By: Carlene Coria RN Entered By: Carlene Coria on 06/01/2021 11:30:04 [name] ([id number]) -------------------------------------------------------------------------------- [place] Details Patient Name: [name] Date of Service: 06/01/2021 11:00 AM Medical Record Number: [id number] Patient Account Number: [id number] Date of Birth/Sex: 1960/10/07 (61 y.o. M) Treating RN: Carlene Coria Primary Care Delcia Spitzley: Derinda Late Other Clinician: Referring Jax Kentner: Derinda Late Treating Ulah Olmo/Extender: Yaakov Guthrie in Treatment: 1 Active Inactive Wound/Skin Impairment Nursing Diagnoses: Impaired tissue  integrity Knowledge deficit related to smoking impact on wound healing Knowledge deficit related to ulceration/compromised skin integrity Goals: Patient/caregiver will verbalize understanding of skin care regimen Date Initiated: 05/25/2021 Target Resolution Date: 07/04/2021 Goal Status: Active Ulcer/skin breakdown will have a volume reduction of 30% by week 4 Date Initiated: 05/25/2021 Target Resolution Date: 06/22/2021 Goal Status: Active Ulcer/skin breakdown will have a volume reduction of 50% by week 8 Date Initiated: 05/25/2021 Target Resolution Date: 07/20/2021 Goal Status: Active Ulcer/skin breakdown will have a volume reduction of 80% by week 12 Date Initiated: 05/25/2021 Target Resolution Date: 08/10/2021 Goal Status: Active Ulcer/skin breakdown will heal within 14 weeks Date Initiated: 05/25/2021 Target Resolution Date: 09/07/2021 Goal Status: Active Interventions: Assess patient/caregiver ability to obtain necessary supplies Assess patient/caregiver ability to perform ulcer/skin care regimen  upon admission and as needed Assess ulceration(s) every visit Notes: Electronic Signature(s) Signed: 06/01/2021 3:33:48 PM By: Carlene Coria RN Entered By: Carlene Coria on 06/01/2021 11:29:29 [name] ([id number]) -------------------------------------------------------------------------------- Pain Assessment Details Patient Name: [name] Date of Service: 06/01/2021 11:00 AM Medical Record Number: [id number] Patient Account Number: [id number] Date of Birth/Sex: March 07, 1961 (61 y.o. M) Treating RN: Carlene Coria Primary Care Kregg Cihlar: Derinda Late Other Clinician: Referring Jessa Stinson: Derinda Late Treating Manami Tutor/Extender: Yaakov Guthrie in Treatment: 1 Active Problems Location of Pain Severity and Description of Pain Patient Has Paino Yes Site Locations With Dressing Change: Yes Duration of the Pain. Constant / Intermittento Constant Rate the pain. Current Pain Level: 4 Worst Pain Level: 6 Least Pain Level: 2 Tolerable Pain Level: 5 Character of Pain Describe the Pain: Aching, Sharp Pain Management and Medication Current Pain Management: Medication: Yes Cold Application: No Rest: Yes Massage: No Activity: No T.E.N.S.: No Heat Application: No Leg drop or elevation: No Is the Current Pain Management Adequate: Inadequate How does your wound impact your activities of daily livingo Sleep: Yes Bathing: No Appetite: Yes Relationship With Others: No Bladder Continence: No Emotions: No Bowel Continence: No Work: No Toileting: No Drive: No Dressing: No Hobbies: No Electronic Signature(s) Signed: 06/01/2021 3:33:48 PM By: Carlene Coria RN Entered By: Carlene Coria on 06/01/2021 11:03:42 [name] ([id number]) -------------------------------------------------------------------------------- Patient/Caregiver Education Details Patient Name: [name] Date of Service: 06/01/2021 11:00 AM Medical Record Number: [id number] Patient Account  Number: [id number] Date of Birth/Gender: 04/17/1961 (61 y.o. M) Treating RN: Carlene Coria Primary Care Physician: Derinda Late Other Clinician: Referring Physician: Derinda Late Treating Physician/Extender: Yaakov Guthrie in Treatment: 1 Education Assessment Education Provided To: Patient Education Topics Provided Wound/Skin Impairment: Methods: Explain/Verbal Responses: State content correctly Electronic Signature(s) Signed: 06/01/2021 3:33:48 PM By: Carlene Coria RN Entered By: Carlene Coria on 06/01/2021 11:35:32 [name] ([id number]) -------------------------------------------------------------------------------- Wound Assessment Details Patient Name: [name] Date of Service: 06/01/2021 11:00 AM Medical Record Number: [id number] Patient Account Number: [id number] Date of Birth/Sex: November 13, 1960 (61 y.o. 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 Etiology: Wounding Event: Surgical Injury Wound Status: Open Date Acquired: 04/12/2021 Comorbid History: Coronary Artery Disease, Hypertension, Type I Weeks Of Treatment: 1 Diabetes Clustered Wound: No Pending Amputation On Presentation Photos Wound Measurements Length: (cm) 5.5 Width: (cm) 14.5 Depth: (cm) 2.9 Area: (cm) 62.636 Volume: (cm) 181.643 % Reduction in Area: 11.4% % Reduction in Volume: 11.4% Epithelialization: None  Tunneling: No Undermining: Yes Starting Position (o'clock): 9 Ending Position (o'clock): 3 Maximum Distance: (cm) 7 Wound Description Classification: Full Thickness With Exposed Support Structures Exudate Amount: Large Exudate Type: Serosanguineous Exudate  Color: red, brown Foul Odor After Cleansing: No Slough/Fibrino Yes Wound Bed Granulation Amount: Large (67-100%) Exposed Structure Granulation Quality: Red, Pink Fascia Exposed: No Necrotic Amount: Small (1-33%) Fat Layer (Subcutaneous Tissue) Exposed: Yes Necrotic Quality: Eschar Tendon Exposed: Yes Muscle Exposed: Yes Necrosis of Muscle: Joint Exposed: No Bone Exposed: Yes Treatment Notes Wound #1 (Amputation Site - Below Knee) Wound Laterality: Left [name], [name] ([id number]) Cleanser Dakin 16 (oz) 0.25 Discharge Instruction: Use as directed. Normal Saline Discharge Instruction: Wash your hands with soap and water. Remove old dressing, discard into plastic bag and place into trash. Cleanse the wound with Normal Saline prior to applying a clean dressing using gauze sponges, not tissues or cotton balls. Do not scrub or use excessive force. Pat dry using gauze sponges, not tissue or cotton balls. Wound Cleanser Discharge Instruction: Wash your hands with soap and water. Remove old dressing, discard into plastic bag and place into trash. Cleanse the wound with Wound Cleanser prior to applying a clean dressing using gauze sponges, not tissues or cotton balls. Do not scrub or use excessive force. Pat dry using gauze sponges, not tissue or cotton balls. Peri-Wound Care Topical Primary Dressing Gauze Discharge Instruction: Dakins lightly soaked gauze lightly packed-As directed: dry, moistened with saline or moistened with Dakins Solution Secondary Dressing ABD Pad 5x9 (in/in) Discharge Instruction: Cover with ABD pad Secured With 69M ACE Elastic Bandage With VELCRO Brand Closure, 4 (in) Discharge Instruction: PRN to keep dressing in place 69M [place] Surgical Tape, 2x2 (in/yd) Kerlix Roll Sterile or Non-Sterile 6-ply 4.5x4 (yd/yd) Discharge Instruction: Apply Kerlix as directed Compression Wrap Compression Stockings Add-Ons Electronic Signature(s) Signed:  06/01/2021 3:33:48 PM By: Carlene Coria RN Entered By: Carlene Coria on 06/01/2021 11:33:07 [name] ([id number]) -------------------------------------------------------------------------------- [place] Details Patient Name: [name] Date of Service: 06/01/2021 11:00 AM Medical Record Number: [id number] Patient Account Number: [id number] Date of Birth/Sex: [birth year] (61 y.o. M) Treating RN: Carlene Coria Primary Care Braylin Formby: Derinda Late Other Clinician: Referring Kroy Sprung: Derinda Late Treating Kamran Coker/Extender: Yaakov Guthrie in Treatment: 1 Vital Signs Time Taken: 11:02 Temperature (F): 98.4 Holder (in): 71 Pulse (bpm): 75 Weight (lbs): 250 Respiratory Rate (breaths/min): 18 Body Mass Index (BMI): 34.9 Blood Pressure (mmHg): 118/72 Reference Range: 80 - 120 mg / dl Electronic Signature(s) Signed: 06/01/2021 3:33:48 PM By: Carlene Coria RN Entered By: Carlene Coria on 06/01/2021 11:02:56

## 2021-06-02 ENCOUNTER — Encounter: Payer: Self-pay | Admitting: Psychiatry

## 2021-06-02 ENCOUNTER — Telehealth (HOSPITAL_COMMUNITY): Payer: Self-pay | Admitting: Clinical

## 2021-06-02 ENCOUNTER — Ambulatory Visit (INDEPENDENT_AMBULATORY_CARE_PROVIDER_SITE_OTHER): Payer: HMO | Admitting: Psychiatry

## 2021-06-02 VITALS — BP 140/77 | HR 76 | Temp 97.9°F

## 2021-06-02 DIAGNOSIS — F411 Generalized anxiety disorder: Secondary | ICD-10-CM

## 2021-06-02 DIAGNOSIS — F331 Major depressive disorder, recurrent, moderate: Secondary | ICD-10-CM | POA: Diagnosis not present

## 2021-06-02 DIAGNOSIS — R635 Abnormal weight gain: Secondary | ICD-10-CM

## 2021-06-02 DIAGNOSIS — T50905A Adverse effect of unspecified drugs, medicaments and biological substances, initial encounter: Secondary | ICD-10-CM | POA: Diagnosis not present

## 2021-06-02 DIAGNOSIS — F5101 Primary insomnia: Secondary | ICD-10-CM | POA: Diagnosis not present

## 2021-06-02 MED ORDER — CLONAZEPAM 1 MG PO TABS: 1.0000 mg | ORAL_TABLET | Freq: Two times a day (BID) | ORAL | 1 refills | Status: DC | PRN

## 2021-06-02 MED ORDER — BUPROPION HCL ER (XL) 300 MG PO TB24: 300.0000 mg | ORAL_TABLET | Freq: Every day | ORAL | 0 refills | Status: DC

## 2021-06-02 MED ORDER — VENLAFAXINE HCL ER 150 MG PO CP24: 150.0000 mg | ORAL_CAPSULE | Freq: Every day | ORAL | 0 refills | Status: DC

## 2021-06-02 MED ORDER — OLANZAPINE 7.5 MG PO TABS: ORAL_TABLET | ORAL | 1 refills | Status: DC

## 2021-06-02 MED ORDER — ZOLPIDEM TARTRATE 10 MG PO TABS: ORAL_TABLET | ORAL | 1 refills | Status: DC

## 2021-06-02 NOTE — Progress Notes (Addendum)
[place] MD OP Progress Note  06/02/2021 1:05 PM [name]  MRN:  [id number]  Chief Complaint:  Chief Complaint   Follow-up     61 year old Caucasian male, married, has a history of MDD, GAD, insomnia, multiple medical problems including history of tracheal stenosis status post multiple reintubation, recent below-knee amputation, presents for follow-up medication management.   HPI: [name] is a 61 year old Caucasian male, married, lives in [place], has a history of MDD, GAD, insomnia, history of tracheal stenosis status post multiple reintubation, respiratory failure due to opioid overdose, history of CVA, right-sided hemiparesis, diabetes mellitus, hypertension, NSTEMI, hypothyroidism, chronic pain was evaluated in office today.  Patient as well as mother participated in the evaluation.  Patient appeared to be depressed, had multiple hospitalization, status post BKA of his left lower extremity.  Patient is currently struggling to accept this.  Patient also has stressors of relationship struggles with his wife's daughter who moved in with them.  That has been a constant struggle as well.  Patient reports sadness, low energy, anxiety symptoms as well as passive thoughts of not wanting to be here at times.  However denies any active suicidal thoughts or plan.  Reviewed notes per vascular surgery-Dr. Jennet Maduro 05/20/2021.  Patient with history of bilateral extremity PAD with lower left extremity CLTI for tissue loss with no revascularization option, status post left BKA 04/07/2021 status post OR wound debridement 05/07/2021.   Per mother who provided collateral information, patient was taken off of some of his psychotropic medications while he was admitted to the hospital at [facility].  However he was restarted on several of his psychotropic medications after his discharge by his primary care provider.  Mother brought in a list of medications which he takes now.  It was updated in the  system. According to mother patient's wife is also currently struggling with health problems and hence has been unable to care for him.  Hence she and his dad are currently trying to support, help with wound care dressing and so on on a daily basis.  Home health nurse comes in couple of times a week.  PT and OT also comes in once a week.        Visit Diagnosis:    ICD-10-CM   1. MDD (major depressive disorder), recurrent episode, moderate (HCC)  F33.1 OLANZapine (ZYPREXA) 7.5 MG tablet    venlafaxine XR (EFFEXOR-XR) 150 MG 24 hr capsule    buPROPion (WELLBUTRIN XL) 300 MG 24 hr tablet    2. GAD (generalized anxiety disorder)  F41.1 clonazePAM (KLONOPIN) 1 MG tablet    3. Primary insomnia  F51.01 zolpidem (AMBIEN) 10 MG tablet    4. Weight gain due to medication  R63.5    T50.905A    likely due to psychotropics      Past Psychiatric History: Reviewed past psychiatric history from progress note on 01/15/2019.  Past trials of fluoxetine, Rexulti, Xanax, Abilify.  Patient completed TMS-04/05/2020.  Patient with [place] sessions in the past prior to that.  Patient with history of ECT-did not tolerate it.  Patient with multiple inpatient mental health admission in 2020.  Past suicide attempt-2014.  Past Medical History:  Past Medical History:  Diagnosis Date   Depression    Diabetes ([place])    Insulin Pump   Diabetes mellitus type I ([place])    Diabetes mellitus without complication (HCC)    GERD (gastroesophageal reflux disease)    H/O laryngectomy    Heel bone fracture    Hyperlipidemia  Hypertension    Radicular pain of right lower extremity    Stroke [facility])    Suicide attempt [facility]) 2014   damaged larynx - tracheostomy   Thyroid disease     Past Surgical History:  Procedure Laterality Date   COLONOSCOPY WITH PROPOFOL N/A 05/15/2018   Procedure: COLONOSCOPY WITH PROPOFOL;  Surgeon: [name], [name], MD;  Location: [facility] ENDOSCOPY;  Service: Gastroenterology;  Laterality: N/A;    ESOPHAGOGASTRODUODENOSCOPY N/A 05/15/2018   Procedure: ESOPHAGOGASTRODUODENOSCOPY (EGD);  Surgeon: [name], [name], MD;  Location: [facility] ENDOSCOPY;  Service: Gastroenterology;  Laterality: N/A;   FRACTURE SURGERY     Heel bone reconstruction Left    HERNIA REPAIR  37/3428   Umbilical hernia repair    LARYNGECTOMY     LOWER EXTREMITY ANGIOGRAPHY Left 01/31/2021   Procedure: LOWER EXTREMITY ANGIOGRAPHY;  Surgeon: Algernon Huxley, MD;  Location: [place] CV LAB;  Service: Cardiovascular;  Laterality: Left;   LOWER EXTREMITY ANGIOGRAPHY Right 02/14/2021   Procedure: LOWER EXTREMITY ANGIOGRAPHY;  Surgeon: Algernon Huxley, MD;  Location: [place] CV LAB;  Service: Cardiovascular;  Laterality: Right;   NECK SURGERY     fusion   SPINE SURGERY     TRACHEOSTOMY  2014   from [place] attempt    Family Psychiatric History: Reviewed family psychiatric history from progress note on 01/15/2019.  Family History:  Family History  Problem Relation Age of Onset   Osteoporosis Mother    Diabetes Mother    Hypertension Father    Mental illness Neg Hx     Social History: Reviewed social history from progress note on 01/15/2019. Social History   Socioeconomic History   Marital status: Married    Spouse name: [name]   Number of children: Not on file   Years of education: Not on file   Highest education level: Not on file  Occupational History   Not on file  Tobacco Use   Smoking status: Former    Packs/day: 0.00    Types: Cigarettes    Quit date: 04/09/2013    Years since quitting: 8.1   Smokeless tobacco: Never  Vaping Use   Vaping Use: Never used  Substance and Sexual Activity   Alcohol use: Yes    Alcohol/week: 2.0 standard drinks    Types: 2 Shots of liquor per week    Comment: rare   Drug use: No    Comment: Pt denied; UDS not available   Sexual activity: Yes    Partners: Female    Birth control/protection: Condom  Other Topics Concern   Not on file  Social History Narrative   Not on  file   Social Determinants of Health   Financial Resource Strain: Not on file  Food Insecurity: Not on file  Transportation Needs: Not on file  Physical Activity: Not on file  Stress: Not on file  Social Connections: Not on file    Allergies:  Allergies  Allergen Reactions   Buspar [Buspirone]     Makes the patient "flip out"   Depakote [Valproic Acid]     Causes excessive drowsiness   Clopidogrel Rash   Gabapentin Itching and Rash    Metabolic Disorder Labs: Lab Results  Component Value Date   HGBA1C 7.8 (H) 02/14/2021   MPG 177 02/14/2021   MPG 177.16 01/01/2019   No results found for: PROLACTIN Lab Results  Component Value Date   CHOL 161 09/15/2016   TRIG 190 (H) 09/15/2016   HDL 41 09/15/2016   CHOLHDL  3.9 09/15/2016   VLDL 38 09/15/2016   LDLCALC 82 09/15/2016   Lab Results  Component Value Date   TSH 1.952 09/15/2016    Therapeutic Level Labs: No results found for: LITHIUM Lab Results  Component Value Date   VALPROATE 19 (L) 09/12/2016   No components found for:  CBMZ  Current Medications: Current Outpatient Medications  Medication Sig Dispense Refill   amLODipine (NORVASC) 5 MG tablet Take 1 tablet (5 mg total) by mouth daily. 30 tablet 1   apixaban (ELIQUIS) 5 MG TABS tablet Take 1 tablet (5 mg total) by mouth 2 (two) times daily. 60 tablet 5   empagliflozin (JARDIANCE) 25 MG TABS tablet Take 25 mg by mouth daily. 30 tablet 1   insulin aspart (NOVOLOG) 100 UNIT/ML injection INJECT UP TO 120 UNITS UNDER THE SKIN AS DIRECTED DAILY VIA INSULIN PUMP     levothyroxine (SYNTHROID) 75 MCG tablet TAKE 1 TABLET BY MOUTH DAILY AT 6 AM 90 tablet 0   pantoprazole (PROTONIX) 40 MG tablet TAKE 1 TABLET(40 MG) BY MOUTH TWICE DAILY     buPROPion (WELLBUTRIN XL) 300 MG 24 hr tablet Take 1 tablet (300 mg total) by mouth daily with breakfast. Next refill please 90 tablet 0   clonazePAM (KLONOPIN) 1 MG tablet Take 1 tablet (1 mg total) by mouth 2 (two) times daily  as needed. Take 1 tablet daily AM and 1 tablet daily at bedtime 60 tablet 1   OLANZapine (ZYPREXA) 7.5 MG tablet Take half tablet ( 7.5 mg ) at bedtime - dose reduction 15 tablet 1   oxyCODONE-acetaminophen (PERCOCET) 10-325 MG tablet Take 1 tablet by mouth every 8 (eight) hours as needed for pain. (Patient not taking: Reported on 06/02/2021) 30 tablet 0   venlafaxine XR (EFFEXOR-XR) 150 MG 24 hr capsule Take 1 capsule (150 mg total) by mouth daily with breakfast. 90 capsule 0   zolpidem (AMBIEN) 10 MG tablet TAKE 1 TABLET(10 MG) BY MOUTH AT BEDTIME AS NEEDED FOR SLEEP 30 tablet 1   No current facility-administered medications for this visit.     Musculoskeletal: Strength & Muscle Tone:  baseline Gait & Station:  Wheel chair - S/P recent BKA left sided Patient leans: N/A  Psychiatric Specialty Exam: Review of Systems  Psychiatric/Behavioral:  Positive for dysphoric mood. The patient is nervous/anxious.   All other systems reviewed and are negative.  Blood pressure 140/77, pulse 76, temperature 97.9 F (36.6 C), temperature source Temporal.There is no height or weight on file to calculate BMI.  General Appearance: Casual  Eye Contact:  Fair  Speech:   slow,baseline , history of tracheostomy  Volume:  Decreased  Mood:  Anxious and Depressed  Affect:  Congruent  Thought Process:  Goal Directed and Descriptions of Associations: Intact  Orientation:  Full (Time, Place, and Person)  Thought Content: Logical   Suicidal Thoughts:  No  Homicidal Thoughts:  No  Memory:  Immediate;   Fair Recent;   Fair Remote;   Fair  Judgement:  Fair  Insight:  Fair  Psychomotor Activity:  Normal  Concentration:  Concentration: Fair and Attention Span: Fair  Recall:  [employer] of Knowledge: Fair  Language: Fair  Akathisia:  No  Handed:  Right  AIMS (if indicated): done, 0  Assets:  Communication Skills Desire for Improvement Housing Social Support  ADL's:  Intact  Cognition: WNL  Sleep:   Fair   Screenings: [place] Office Visit [facility]03/[facility] in [facility]  AIMS Total Score 0      AUDIT    Flowsheet Row Admission (Discharged) from 01/22/2019 in [place] Admission (Discharged) from 12/31/2018 in [place] Admission (Discharged) from 09/14/2016 in [place]  Alcohol Use Disorder Identification Test Final Score (AUDIT) 0 0 1      ECT-MADRS    Flowsheet Row Admission (Discharged) from 01/22/2019 in [place] Total Score 27      Mini-Mental    [place] Admission (Discharged) from 01/22/2019 in [place]  Total Score (max 30 points ) 30      PHQ2-9    [place] Visit from 06/02/2021 in [place] Office Visit from 03/22/2021 in [place] Video Visit from 11/25/2020 in [place] Video Visit from 10/26/2020 in [place] Video Visit from 09/16/2020 in [place]  PHQ-2 Total Score 5 0 0 3 3  PHQ-9 Total Score 15 -- 5 10 10       [place] Office Visit from 06/02/2021 in [place] Admission (Discharged) from 02/14/2021 in [facility] Video Visit from 11/25/2020 in [place] Low Risk No Risk Low Risk        Assessment and Plan: [name] is a 61 year old Caucasian male, married, on disability, history of multiple medical problems, MDD, GAD was evaluated in office today.  Patient with recent below-knee amputation, status post hospitalization and discharge, presents with worsening depression, anxiety and will benefit from medication readjustment and continued psychotherapy sessions.  Plan MDD-unstable Recently restarted on  Wellbutrin XL 300 mg p.o. daily Recently restarted on Effexor extended release 300 mg p.o. daily Reduce Zyprexa to 7.5 mg p.o. nightly  GAD-unstable Effexor extended release 300 mg p.o. daily Increase Klonopin to 1 mg p.o. twice daily as needed. Reviewed  PMP AWARE. Continue CBT with Mr. [name].  I have coordinated care.  Insomnia-improving Ambien 10 mg p.o. nightly  Weight gain secondary to psychotropics-unstable Patient to monitor his diet.  We will monitor closely. I have reduced the dosage of Zyprexa as noted above.   Reviewed notes per vascular surgery-dated 06/06/2021-as noted above-patient status post BKA left-sided.  I have obtained collateral information from mother who presented with patient today.  Follow-up in clinic in 4 weeks or sooner in person.  This note was generated in part or whole with voice recognition software. Voice recognition is usually quite accurate but there are transcription errors that can and very often do occur. I apologize for any typographical errors that were not detected and corrected.        Ursula Alert, MD 06/03/2021, 11:19 AM

## 2021-06-02 NOTE — Telephone Encounter (Signed)
Called to schedul f/u appt for counseling, left detailed voicemail

## 2021-06-03 ENCOUNTER — Telehealth (HOSPITAL_COMMUNITY): Payer: Self-pay | Admitting: Psychiatry

## 2021-06-03 NOTE — Telephone Encounter (Signed)
Called patient x2 left 2 voicemails requesting to schedule a f/u appt with [name], LCSW.

## 2021-06-05 DIAGNOSIS — Z89512 Acquired absence of left leg below knee: Secondary | ICD-10-CM | POA: Diagnosis not present

## 2021-06-08 ENCOUNTER — Encounter: Payer: HMO | Admitting: Internal Medicine

## 2021-06-10 DIAGNOSIS — Z792 Long term (current) use of antibiotics: Secondary | ICD-10-CM | POA: Diagnosis not present

## 2021-06-10 DIAGNOSIS — E119 Type 2 diabetes mellitus without complications: Secondary | ICD-10-CM | POA: Diagnosis not present

## 2021-06-10 DIAGNOSIS — I10 Essential (primary) hypertension: Secondary | ICD-10-CM | POA: Diagnosis not present

## 2021-06-10 DIAGNOSIS — T148XXD Other injury of unspecified body region, subsequent encounter: Secondary | ICD-10-CM | POA: Diagnosis not present

## 2021-06-10 DIAGNOSIS — F32A Depression, unspecified: Secondary | ICD-10-CM | POA: Diagnosis not present

## 2021-06-10 DIAGNOSIS — I739 Peripheral vascular disease, unspecified: Secondary | ICD-10-CM | POA: Diagnosis not present

## 2021-06-10 DIAGNOSIS — Z4781 Encounter for orthopedic aftercare following surgical amputation: Secondary | ICD-10-CM | POA: Diagnosis not present

## 2021-06-10 DIAGNOSIS — F419 Anxiety disorder, unspecified: Secondary | ICD-10-CM | POA: Diagnosis not present

## 2021-06-10 DIAGNOSIS — T8149XD Infection following a procedure, other surgical site, subsequent encounter: Secondary | ICD-10-CM | POA: Diagnosis not present

## 2021-06-10 DIAGNOSIS — Z89512 Acquired absence of left leg below knee: Secondary | ICD-10-CM | POA: Diagnosis not present

## 2021-06-13 ENCOUNTER — Telehealth: Payer: Self-pay

## 2021-06-13 NOTE — Telephone Encounter (Signed)
pt mother called she left a message states that she believe that he took more than he was suppose to of the zolpidem and  the klonopin

## 2021-06-13 NOTE — Telephone Encounter (Signed)
Returned call to mother-Ms. Radene Knee a message that patient needs to be evaluated in an emergency department if he has taken more than prescribed . Also left VM to call us back to discuss concerns.

## 2021-06-13 NOTE — Telephone Encounter (Signed)
I have left 2 message to call back. to get more info

## 2021-06-15 ENCOUNTER — Encounter: Payer: HMO | Attending: Internal Medicine | Admitting: Internal Medicine

## 2021-06-15 ENCOUNTER — Other Ambulatory Visit: Payer: Self-pay

## 2021-06-15 DIAGNOSIS — X58XXXA Exposure to other specified factors, initial encounter: Secondary | ICD-10-CM | POA: Diagnosis not present

## 2021-06-15 DIAGNOSIS — Z89512 Acquired absence of left leg below knee: Secondary | ICD-10-CM | POA: Diagnosis not present

## 2021-06-15 DIAGNOSIS — Z794 Long term (current) use of insulin: Secondary | ICD-10-CM | POA: Diagnosis not present

## 2021-06-15 DIAGNOSIS — L97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: Secondary | ICD-10-CM | POA: Diagnosis not present

## 2021-06-15 DIAGNOSIS — E10622 Type 1 diabetes mellitus with other skin ulcer: Secondary | ICD-10-CM | POA: Insufficient documentation

## 2021-06-15 DIAGNOSIS — T8131XA Disruption of external operation (surgical) wound, not elsewhere classified, initial encounter: Secondary | ICD-10-CM | POA: Insufficient documentation

## 2021-06-15 NOTE — Telephone Encounter (Signed)
Noted, thank you

## 2021-06-15 NOTE — Progress Notes (Addendum)
JERVIS, TRAPANI ([id number]) Visit Report for 06/15/2021 Arrival Information Details Patient Name: [name], [name]. Date of Service: 06/15/2021 3:15 PM Medical Record Number: [id number] Patient Account Number: [id number] Date of Birth/Sex: 29-Sep-1960 (61 y.o. M) Treating RN: Donnamarie Poag Primary Care Sarabella Caprio: Derinda Late Other Clinician: Referring Jisela Merlino: Derinda Late Treating Aolanis Crispen/Extender: Yaakov Guthrie in Treatment: 3 Visit Information History Since Last Visit Added or deleted any medications: No Patient Arrived: Wheel Chair Had a fall or experienced change in No Arrival Time: 15:16 activities of daily living that may affect Accompanied By: mother risk of falls: Transfer Assistance: EasyPivot Patient Lift Hospitalized since last visit: No Patient Identification Verified: Yes Has Dressing in Place as Prescribed: Yes Secondary Verification Process Completed: Yes Pain Present Now: No Patient Requires Transmission-Based No Precautions: Patient Has Alerts: Yes Patient Alerts: Patient on Blood Thinner Eliquis Diabetic Electronic Signature(s) Signed: 06/15/2021 4:26:12 PM By: Donnamarie Poag Entered By: Donnamarie Poag on 06/15/2021 15:20:10 [name] ([id number]) -------------------------------------------------------------------------------- Clinic Level of Care Assessment Details Patient Name: [name] Date of Service: 06/15/2021 3:15 PM Medical Record Number: [id number] Patient Account Number: [id number] Date of Birth/Sex: 1960/09/29 (61 y.o. M) Treating RN: Donnamarie Poag Primary Care Khup Sapia: Derinda Late Other Clinician: Referring Kaytlyn Din: Derinda Late Treating Doralene Glanz/Extender: Yaakov Guthrie in Treatment: 3 Clinic Level of Care Assessment Items TOOL 4 Quantity Score _0  - Use when only an EandM is performed on FOLLOW-UP visit 0 ASSESSMENTS - Nursing Assessment / Reassessment _1  - Reassessment of Co-morbidities (includes updates in  patient status) 0 _2  - 0 Reassessment of Adherence to Treatment Plan ASSESSMENTS - Wound and Skin Assessment / Reassessment X - Simple Wound Assessment / Reassessment - one wound 1 5 _3  - 0 Complex Wound Assessment / Reassessment - multiple wounds _4  - 0 Dermatologic / Skin Assessment (not related to wound area) ASSESSMENTS - Focused Assessment _5  - Circumferential Edema Measurements - multi extremities 0 _6  - 0 Nutritional Assessment / Counseling / Intervention _7  - 0 Lower Extremity Assessment (monofilament, tuning fork, pulses) _8  - 0 Peripheral Arterial Disease Assessment (using hand held doppler) ASSESSMENTS - Ostomy and/or Continence Assessment and Care _9  - Incontinence Assessment and Management 0 _10  - 0 Ostomy Care Assessment and Management (repouching, etc.) PROCESS - Coordination of Care X - Simple Patient / Family Education for ongoing care 1 15 _11  - 0 Complex (extensive) Patient / Family Education for ongoing care X- 1 10 Staff obtains Programmer, systems, Records, Test Results / Process Orders _12  - 0 Staff telephones HHA, Nursing Homes / Clarify orders / etc _13  - 0 Routine Transfer to another Facility (non-emergent condition) _14  - 0 Routine Hospital Admission (non-emergent condition) _15  - 0 New Admissions / Biomedical engineer / Ordering NPWT, Apligraf, etc. _16  - 0 Emergency Hospital Admission (emergent condition) X- 1 10 Simple Discharge Coordination _17  - 0 Complex (extensive) Discharge Coordination PROCESS - Special Needs _18  - Pediatric / Minor Patient Management 0 _19  - 0 Isolation Patient Management _20  - 0 Hearing / Language / Visual special needs _21  - 0 Assessment of Community assistance (transportation, D/C planning, etc.) _22  - 0 Additional assistance / Altered mentation _23  - 0 Support Surface(s) Assessment (bed, cushion, seat, etc.) INTERVENTIONS - Wound Cleansing / Measurement [name], [name]. ([id number]) X- 1 5 Simple Wound Cleansing - one  wound _24  - 0 Complex Wound Cleansing - multiple wounds X- 1 5 Wound Imaging (photographs - any number of wounds) _25  - 0 Wound Tracing (instead of photographs) X- 1 5 Simple Wound  Measurement - one wound _0  - 0 Complex Wound Measurement - multiple wounds INTERVENTIONS - Wound Dressings _1  - Small Wound Dressing one or multiple wounds 0 _2  - 0 Medium Wound Dressing one or multiple wounds X- 1 20 Large Wound Dressing one or multiple wounds X- 1 5 Application of Medications - topical <[id number]>_0<\/[id number]>_4  - 0 Application of Medications - injection INTERVENTIONS - Miscellaneous _4  - External ear exam 0 _5  - 0 Specimen Collection (cultures, biopsies, blood, body fluids, etc.) _6  - 0 Specimen(s) / Culture(s) sent or taken to Lab for analysis _7  - 0 Patient Transfer (multiple staff / Harrel Lemon Lift / Similar devices) _8  - 0 Simple Staple / Suture removal (25 or less) _9  - 0 Complex Staple / Suture removal (26 or more) _10  - 0 Hypo / Hyperglycemic Management (close monitor of Blood Glucose) _11  - 0 Ankle / Brachial Index (ABI) - do not check if billed separately X- 1 5 Vital Signs Has the patient been seen at the hospital within the last three years: Yes Total Score: 85 Level Of Care: New/Established - Level 3 Electronic Signature(s) Signed: 06/15/2021 4:26:12 PM By: Donnamarie Poag Entered By: Donnamarie Poag on 06/15/2021 15:56:03 [name] ([id number]) -------------------------------------------------------------------------------- Encounter Discharge Information Details Patient Name: [name] Date of Service: 06/15/2021 3:15 PM Medical Record Number: [id number] Patient Account Number: [id number] Date of Birth/Sex: 1960/11/01 (61 y.o. M) Treating RN: Donnamarie Poag Primary Care Milley Vining: Derinda Late Other Clinician: Referring Azalie Harbeck: Derinda Late Treating Mishawn Didion/Extender: Yaakov Guthrie in Treatment: 3 Encounter Discharge Information Items Discharge Condition:  Stable Ambulatory Status: Wheelchair Discharge Destination: Home Transportation: Private Auto Accompanied By: mother/caregiver Schedule Follow-up Appointment: Yes Clinical Summary of Care: Electronic Signature(s) Signed: 06/15/2021 4:26:12 PM By: Donnamarie Poag Entered By: Donnamarie Poag on 06/15/2021 15:58:42 [name] ([id number]) -------------------------------------------------------------------------------- Lower Extremity Assessment Details Patient Name: [name] Date of Service: 06/15/2021 3:15 PM Medical Record Number: [id number] Patient Account Number: [id number] Date of Birth/Sex: Jul 20, 1960 (61 y.o. M) Treating RN: Donnamarie Poag Primary Care Madalena Kesecker: Derinda Late Other Clinician: Referring Dezeray Puccio: Derinda Late Treating Bexleigh Theriault/Extender: Yaakov Guthrie in Treatment: 3 Edema Assessment Assessed: [Left: Yes] [Right: No] [Left: Edema] [Right: :] Electronic Signature(s) Signed: 06/15/2021 4:26:12 PM By: Donnamarie Poag Entered By: Donnamarie Poag on 06/15/2021 15:28:43 [name] ([id number]) -------------------------------------------------------------------------------- Multi Wound Chart Details Patient Name: [name] Date of Service: 06/15/2021 3:15 PM Medical Record Number: [id number] Patient Account Number: [id number] Date of Birth/Sex: 07-20-1960 (61 y.o. M) Treating RN: Donnamarie Poag Primary Care Lundon Verdejo: Derinda Late Other Clinician: Referring Xandria Gallaga: Derinda Late Treating Kizzie Cotten/Extender: Yaakov Guthrie in Treatment: 3 Vital Signs Holder(in): 71 Pulse(bpm): 71 Weight(lbs): 250 Blood Pressure(mmHg): 121/73 Body Mass Index(BMI): 34.9 Temperature(F): 98.4 Respiratory Rate(breaths/min): 18 Photos: [N/A:N/A] Wound Location: Left Amputation Site - Below Knee N/A N/A Wounding Event: Surgical Injury N/A N/A Primary Etiology: Open Surgical Wound N/A N/A Secondary Etiology: Diabetic Wound/Ulcer of the Lower N/A  N/A Extremity Comorbid History: Coronary Artery Disease, N/A N/A Hypertension, Type I Diabetes Date Acquired: 04/12/2021 N/A N/A Weeks of Treatment: 3 N/A N/A Wound Status: Open N/A N/A Wound Recurrence: No N/A N/A Pending Amputation on Yes N/A N/A Presentation: Measurements L x W x D (cm) 4.7x9x2.2 N/A N/A Area (cm) : 33.222 N/A N/A Volume (cm) : 73.089 N/A N/A % Reduction in Area: 53.00% N/A N/A % Reduction in Volume: 64.30% N/A N/A Starting Position 1 (o'clock): 11 Ending Position 1 (o'clock): 1 Maximum Distance 1 (cm): 3.5 Undermining: Yes N/A N/A Classification: Full  Thickness With Exposed N/A N/A Support Structures Exudate Amount: Large N/A N/A Exudate Type: Serosanguineous N/A N/A Exudate Color: red, brown N/A N/A Granulation Amount: Large (67-100%) N/A N/A Granulation Quality: Red N/A N/A Necrotic Amount: Small (1-33%) N/A N/A Exposed Structures: N/A N/A ESPN, ZEMAN ([id number]) Fat Layer (Subcutaneous Tissue): Yes Tendon: Yes Muscle: Yes Bone: Yes Fascia: No Joint: No Epithelialization: None N/A N/A Treatment Notes Electronic Signature(s) Signed: 06/15/2021 4:26:12 PM By: Donnamarie Poag Entered By: Donnamarie Poag on 06/15/2021 15:30:42 [name] ([id number]) -------------------------------------------------------------------------------- [place] Details Patient Name: [name] Date of Service: 06/15/2021 3:15 PM Medical Record Number: [id number] Patient Account Number: [id number] Date of Birth/Sex: 1960/06/11 (61 y.o. M) Treating RN: Donnamarie Poag Primary Care Mazi Brailsford: Derinda Late Other Clinician: Referring Kaynen Minner: Derinda Late Treating Kaytelyn Glore/Extender: Yaakov Guthrie in Treatment: 3 Active Inactive Wound/Skin Impairment Nursing Diagnoses: Impaired tissue integrity Knowledge deficit related to smoking impact on wound healing Knowledge deficit related to ulceration/compromised skin  integrity Goals: Patient/caregiver will verbalize understanding of skin care regimen Date Initiated: 05/25/2021 Date Inactivated: 06/15/2021 Target Resolution Date: 07/04/2021 Goal Status: Met Ulcer/skin breakdown will have a volume reduction of 30% by week 4 Date Initiated: 05/25/2021 Target Resolution Date: 06/22/2021 Goal Status: Active Ulcer/skin breakdown will have a volume reduction of 50% by week 8 Date Initiated: 05/25/2021 Target Resolution Date: 07/20/2021 Goal Status: Active Ulcer/skin breakdown will have a volume reduction of 80% by week 12 Date Initiated: 05/25/2021 Target Resolution Date: 08/10/2021 Goal Status: Active Ulcer/skin breakdown will heal within 14 weeks Date Initiated: 05/25/2021 Target Resolution Date: 09/07/2021 Goal Status: Active Interventions: Assess patient/caregiver ability to obtain necessary supplies Assess patient/caregiver ability to perform ulcer/skin care regimen upon admission and as needed Assess ulceration(s) every visit Notes: Electronic Signature(s) Signed: 06/15/2021 4:26:12 PM By: Donnamarie Poag Entered By: Donnamarie Poag on 06/15/2021 15:29:40 [name] ([id number]) -------------------------------------------------------------------------------- Pain Assessment Details Patient Name: [name] Date of Service: 06/15/2021 3:15 PM Medical Record Number: [id number] Patient Account Number: [id number] Date of Birth/Sex: [birth year] (61 y.o. M) Treating RN: Donnamarie Poag Primary Care Zeenat Jeanbaptiste: Derinda Late Other Clinician: Referring Leven Hoel: Derinda Late Treating Kristapher [place]/Extender: Yaakov Guthrie in Treatment: 3 Active Problems Location of Pain Severity and Description of Pain Patient Has Paino No Site Locations Rate the pain. Current Pain Level: 0 Pain Management and Medication Current Pain Management: Electronic Signature(s) Signed: 06/15/2021 4:26:12 PM By: Donnamarie Poag Entered By: Donnamarie Poag on 06/15/2021 15:23:52 [name] ([id number]) -------------------------------------------------------------------------------- Patient/Caregiver Education Details Patient Name: [name] Date of Service: 06/15/2021 3:15 PM Medical Record Number: [id number] Patient Account Number: [id number] Date of Birth/Gender: 1960/12/22 (61 y.o. M) Treating RN: Donnamarie Poag Primary Care Physician: Derinda Late Other Clinician: Referring Physician: Derinda Late Treating Physician/Extender: Yaakov Guthrie in Treatment: 3 Education Assessment Education Provided To: Patient Education Topics Provided Basic Hygiene: Wound/Skin Impairment: Electronic Signature(s) Signed: 06/15/2021 4:26:12 PM By: Donnamarie Poag Entered By: Donnamarie Poag on 06/15/2021 15:56:20 [name] ([id number]) -------------------------------------------------------------------------------- Wound Assessment Details Patient Name: [name] Date of Service: 06/15/2021 3:15 PM Medical Record Number: [id number] Patient Account Number: [id number] Date of Birth/Sex: 11-22-1960 (61 y.o. 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 Etiology: Wounding Event: Surgical Injury Wound Status: Open Date Acquired: 04/12/2021 Comorbid  History: Coronary Artery Disease, Hypertension, Type I Weeks Of Treatment: 3 Diabetes Clustered Wound: No Pending Amputation On Presentation Photos Wound Measurements Length: (cm) 4.7 Width: (cm) 9 Depth: (cm) 2.2 Area: (cm) 33.222 Volume: (cm) 73.089 % Reduction in Area: 53% % Reduction in Volume: 64.3% Epithelialization: None Tunneling: No Undermining: Yes Starting Position (o'clock):  11 Ending Position (o'clock): 1 Maximum Distance: (cm) 3.5 Wound Description Classification: Full Thickness With Exposed Support Structures Exudate Amount: Large Exudate Type: Serosanguineous Exudate Color: red, brown Foul Odor After Cleansing: No Slough/Fibrino Yes Wound Bed Granulation Amount: Large (67-100%) Exposed Structure Granulation Quality: Red Fascia Exposed: No Necrotic Amount: Small (1-33%) Fat Layer (Subcutaneous Tissue) Exposed: Yes Necrotic Quality: Adherent Slough Tendon Exposed: Yes Muscle Exposed: Yes Necrosis of Muscle: No Joint Exposed: No Bone Exposed: Yes Treatment Notes Wound #1 (Amputation Site - Below Knee) Wound Laterality: Left JASKARN, SCHWEER ([id number]) Cleanser Dakin 16 (oz) 0.25 Discharge Instruction: Use as directed. Normal Saline Discharge Instruction: Wash your hands with soap and water. Remove old dressing, discard into plastic bag and place into trash. Cleanse the wound with Normal Saline prior to applying a clean dressing using gauze sponges, not tissues or cotton balls. Do not scrub or use excessive force. Pat dry using gauze sponges, not tissue or cotton balls. Wound Cleanser Discharge Instruction: Wash your hands with soap and water. Remove old dressing, discard into plastic bag and place into trash. Cleanse the wound with Wound Cleanser prior to applying a clean dressing using gauze sponges, not tissues or cotton balls. Do not scrub or use excessive force. Pat dry using gauze sponges, not tissue or cotton balls. Peri-Wound Care Topical Primary Dressing Gauze Discharge Instruction: Dakins lightly soaked gauze lightly packed-As directed: dry, moistened with saline or moistened with Dakins Solution Secondary Dressing ABD Pad 5x9 (in/in) Discharge Instruction: Cover with ABD pad Secured With 292M ACE Elastic Bandage With VELCRO Brand Closure, 4 (in) Discharge Instruction: PRN to keep dressing in place 292M [place] Surgical  Tape, 2x2 (in/yd) Kerlix Roll Sterile or Non-Sterile 6-ply 4.5x4 (yd/yd) Discharge Instruction: Apply Kerlix as directed Compression Wrap Compression Stockings Add-Ons Electronic Signature(s) Signed: 06/15/2021 4:26:12 PM By: Donnamarie Poag Entered By: Donnamarie Poag on 06/15/2021 15:28:11 [name] ([id number]) -------------------------------------------------------------------------------- [place] Details Patient Name: [name] Date of Service: 06/15/2021 3:15 PM Medical Record Number: [id number] Patient Account Number: [id number] Date of Birth/Sex: 05/31/1960 (61 y.o. M) Treating RN: Donnamarie Poag Primary Care Nyisha Clippard: Derinda Late Other Clinician: Referring Antonious Omahoney: Derinda Late Treating Madysin Crisp/Extender: Yaakov Guthrie in Treatment: 3 Vital Signs Time Taken: 15:20 Temperature (F): 98.4 Holder (in): 71 Pulse (bpm): 76 Weight (lbs): 250 Respiratory Rate (breaths/min): 18 Body Mass Index (BMI): 34.9 Blood Pressure (mmHg): 121/73 Reference Range: 80 - 120 mg / dl Electronic Signature(s) Signed: 06/15/2021 4:26:12 PM By: Donnamarie Poag Entered ByDonnamarie Poag on 06/15/2021 15:20:45

## 2021-06-15 NOTE — Telephone Encounter (Signed)
pt mother called statees that his wife got into the zolpidem and that she took their medications and she is keeping them and putting them in a pill box for them.

## 2021-06-16 NOTE — Progress Notes (Signed)
[name] ([id number]) Visit Report for 06/15/2021 Chief Complaint Document Details Patient Name: [name], [name]. Date of Service: 06/15/2021 3:15 PM Medical Record Number: [id number] Patient Account Number: [id number] Date of Birth/Sex: [birth year] (61 y.o. M) Treating RN: Donnamarie Poag Primary Care Provider: Derinda Late Other Clinician: Referring Provider: Derinda Late Treating Provider/Extender: Yaakov Guthrie in Treatment: 3 Information Obtained from: Patient Chief Complaint Left lower extremity amputation site Joel Electronic Signature(s) Signed: 06/16/2021 12:44:19 PM By: Kalman Shan DO Entered By: Kalman Shan on 06/16/2021 12:36:25 [name] ([id number]) -------------------------------------------------------------------------------- HPI Details Patient Name: [name] Date of Service: 06/15/2021 3:15 PM Medical Record Number: [id number] Patient Account Number: [id number] Date of Birth/Sex: 1960-05-11 (61 y.o. M) Treating RN: Donnamarie Poag Primary Care Provider: Derinda Late Other Clinician: Referring Provider: Derinda Late Treating Provider/Extender: Yaakov Guthrie in Treatment: 3 History of Present Illness HPI Description: Admission 05/25/2021 [name] is a 61 year old male with a past medical history of uncontrolled type 1 diabetes on insulin with last hemoglobin A1c of 8.8, peripheral arterial disease, status post left BKA that presents to the clinic for a left BKA amputation site Joel. Patient developed a diabetic foot infection on 04/01/2021 and ultimately underwent a left BKA on 04/12/2021. The Joel site became infected and he underwent an IandD on 05/07/2021. The Joel was left open at that time. He has been using wet-to-dry dressings for the past 3 weeks. He currently denies Systemic signs of infection. 1/25; patient presents for follow-up. He has been using Dakin's wet-to-dry dressings with improvement in Joel  healing. He has no issues or complaints today. He denies signs of infection. 2/8; Patient presents for follow-up. He continues To use Dakin's wet-to-dry dressings. He has no issues or complaints today. He denies signs of infection. He follows up with his surgeon next week. Electronic Signature(s) Signed: 06/16/2021 12:44:19 PM By: Kalman Shan DO Entered By: Kalman Shan on 06/16/2021 12:40:08 [name] ([id number]) -------------------------------------------------------------------------------- Physical Exam Details Patient Name: [name]. Date of Service: 06/15/2021 3:15 PM Medical Record Number: [id number] Patient Account Number: [id number] Date of Birth/Sex: 13-Jan-1961 (61 y.o. M) Treating RN: Donnamarie Poag Primary Care Provider: Derinda Late Other Clinician: Referring Provider: Derinda Late Treating Provider/Extender: Yaakov Guthrie in Treatment: 3 Constitutional . Psychiatric . Notes Left BKA stump with Joel at the amputation site with mostly granulation tissue throughout. Improvement in depth. No longer probes close to bone. Scant nonviable tissue present. No signs of surrounding tissue infection. Electronic Signature(s) Signed: 06/16/2021 12:44:19 PM By: Kalman Shan DO Entered By: Kalman Shan on 06/16/2021 12:40:48 [name] ([id number]) -------------------------------------------------------------------------------- Physician Orders Details Patient Name: [name] Date of Service: 06/15/2021 3:15 PM Medical Record Number: [id number] Patient Account Number: [id number] Date of Birth/Sex: [birth year] (61 y.o. M) Treating RN: Donnamarie Poag Primary Care Provider: Derinda Late Other Clinician: Referring Provider: Derinda Late Treating Provider/Extender: Yaakov Guthrie in Treatment: 3 Verbal / Phone Orders: No Diagnosis Coding Follow-up Appointments o Return Appointment in 2 weeks. [place] for Joel care. May utilize formulary equivalent dressing for Joel treatment orders unless otherwise specified. Home Health Nurse may visit PRN to address patientos Joel care needs. o Scheduled days for dressing changes to be completed; exception, patient has scheduled Joel care visit that day. o **Please direct any NON-Joel related issues/requests for orders to patient's Primary Care Physician. **If current dressing causes regression in Joel condition, may D/C ordered dressing product/s  and apply Normal Saline Moist Dressing daily until next [place] or Other MD appointment. **Notify [facility] of regression in Joel condition at [phone]. Bathing/ Shower/ Hygiene o No tub bath. Anesthetic (Use 'Patient Medications' Section for Anesthetic Order Entry) o Lidocaine applied to Joel bed Edema Control - Lymphedema / Segmental Compressive Device / Other o DO YOUR BEST to sleep in the bed at night. DO NOT sleep in your recliner. Long hours of sitting in a recliner leads to swelling of the legs and/or potential wounds on your backside. Additional Orders / Instructions o Follow Nutritious Diet and Increase Protein Intake - monitor blood sugar to promote Joel healing o Other: - Keep appointment with surgeon as they had scheduled Medications-Please add to medication list. o Other: - TCA cream sent to pharmacy of choice for rash area on leg and arm Joel Treatment Joel #1 - Amputation Site - Below Holder Joel Laterality: Left Cleanser: Dakin 16 (oz) 0.25 2 x Per Day/15 Days Discharge Instructions: Use as directed. Cleanser: Normal Saline 2 x Per PJK/93 Days Discharge Instructions: Wash your hands with soap and water. Remove old dressing, discard into plastic bag and place into trash. Cleanse the Joel with Normal Saline prior to applying a clean dressing using gauze sponges, not tissues or cotton balls. Do not  scrub or use excessive force. Pat dry using gauze sponges, not tissue or cotton balls. Cleanser: Joel Cleanser 2 x Per Day/15 Days Discharge Instructions: Wash your hands with soap and water. Remove old dressing, discard into plastic bag and place into trash. Cleanse the Joel with Joel Cleanser prior to applying a clean dressing using gauze sponges, not tissues or cotton balls. Do not scrub or use excessive force. Pat dry using gauze sponges, not tissue or cotton balls. Primary Dressing: Gauze 2 x Per OIZ/12 Days Discharge Instructions: Dakins lightly soaked gauze lightly packed-As directed: dry, moistened with saline or moistened with Dakins Solution Secondary Dressing: ABD Pad 5x9 (in/in) 2 x Per Day/15 Days Discharge Instructions: Cover with ABD pad Secured With: 79M ACE Elastic Bandage With VELCRO Brand Closure, 4 (in) 2 x Per Day/15 Days Discharge Instructions: PRN to keep dressing in place Secured With: Strum Surgical Tape, 2x2 (in/yd) 2 x Per Day/15 Days MYERS, T[place]TEROW ([id number]) Secured With: Kerlix Roll Sterile or Non-Sterile 6-ply 4.5x4 (yd/yd) 2 x Per Day/15 Days Discharge Instructions: Apply Kerlix as directed Electronic Signature(s) Signed: 06/16/2021 12:44:19 PM By: Kalman Shan DO Previous Signature: 06/15/2021 4:26:12 PM Version By: Donnamarie Poag Entered By: Kalman Shan on 06/16/2021 12:43:51 [name] ([id number]) -------------------------------------------------------------------------------- Problem List Details Patient Name: [name] Date of Service: 06/15/2021 3:15 PM Medical Record Number: [id number] Patient Account Number: [id number] Date of Birth/Sex: 19-Feb-1961 (61 y.o. 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 Date of Service: 06/15/2021 3:15 PM Medical Record Number: [id number] Patient Account Number: [id number] Date of Birth/Sex: 1960-07-30 (61 y.o. M) Treating RN: Donnamarie Poag Primary Care Provider: Derinda Late Other Clinician: Referring Provider: Derinda Late Treating Provider/Extender: Yaakov Guthrie in Treatment: 3 Subjective Chief Complaint Information obtained from Patient Left lower extremity amputation site Joel History of Present Illness (HPI) Admission 05/25/2021 [name] is a 61 year old male with a past medical history of uncontrolled type 1 diabetes on insulin with last hemoglobin A1c of 8.8, peripheral arterial disease, status post left BKA that presents to the clinic for a left BKA amputation site Joel. Patient developed a diabetic foot infection on 04/01/2021 and ultimately underwent a left BKA on 04/12/2021. The Joel site became infected and he underwent an IandD on 05/07/2021. The Joel was left open at that time. He has been using wet-to-dry dressings for the past 3 weeks. He currently denies Systemic signs of infection. 1/25; patient presents for follow-up. He has been using Dakin's wet-to-dry dressings with improvement in Joel  healing. He has no issues or complaints today. He denies signs of infection. 2/8; Patient presents for follow-up. He continues To use Dakin's wet-to-dry dressings. He has no issues or complaints today. He denies signs of infection. He follows up with his surgeon next week. Objective Constitutional Vitals Time Taken: 3:20 PM, Holder: 71 in, Weight: 250 lbs, BMI: 34.9, Temperature: 98.4 F, Pulse: 76 bpm, Respiratory Rate: 18 breaths/min, Blood Pressure: 121/73 mmHg. General Notes: Left BKA stump with Joel at the amputation site with mostly granulation tissue throughout. Improvement in depth. No longer probes close to bone. Scant nonviable tissue present. No signs of surrounding tissue infection. Integumentary (Hair, Skin) Joel #1 status is Open. Original cause of Joel was Surgical Injury. The date acquired was: 04/12/2021. The Joel has been in treatment 3 weeks. The Joel is located on the Left Amputation Site - Below Holder. The Joel measures 4.7cm length x 9cm width x 2.2cm depth; 33.222cm^2 area and 73.089cm^3 volume. There is bone, muscle, tendon, and Fat Layer (Subcutaneous Tissue) exposed. There is no tunneling noted, however, there is undermining starting at 11:00 and ending at 1:00 with a maximum distance of 3.5cm. There is a large amount of serosanguineous drainage noted. There is large (67-100%) red granulation within the Joel bed. There is a small (1-33%) amount of necrotic tissue within the Joel bed including Adherent Slough. 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 ([id number]) Patient's Joel has shown improvement in size and appearance since last clinic visit. I recommended  continuing Dakin's wet-to-dry dressings. At next clinic visit he may be ready for a Joel VAC. He follows up with his surgeon next week and we will see him the following week for follow- up. Plan Follow-up Appointments: Return Appointment in 2 weeks. Home Health: [place]: - [place] for Joel care. May utilize formulary equivalent dressing for Joel treatment orders unless otherwise specified. Home Health Nurse may visit PRN to address patient s Joel care needs. Scheduled days for dressing changes to be completed; exception, patient has scheduled Joel care visit that day. **Please direct any NON-Joel related issues/requests for orders to patient's Primary Care Physician. **If current dressing causes regression in Joel  condition, may D/C ordered dressing product/s and apply Normal Saline Moist Dressing daily until next [place] or Other MD appointment. **Notify [facility] of regression in Joel condition at [phone]. Bathing/ Shower/ Hygiene: No tub bath. Anesthetic (Use 'Patient Medications' Section for Anesthetic Order Entry): Lidocaine applied to Joel bed Edema Control - Lymphedema / Segmental Compressive Device / Other: DO YOUR BEST to sleep in the bed at night. DO NOT sleep in your recliner. Long hours of sitting in a recliner leads to swelling of the legs and/or potential wounds on your backside. Additional Orders / Instructions: Follow Nutritious Diet and Increase Protein Intake - monitor blood sugar to promote Joel healing Other: - Keep appointment with surgeon as they had scheduled Medications-Please add to medication list.: Other: - TCA cream sent to pharmacy of choice for rash area on leg and arm [name] Joel Laterality: Left Cleanser: Dakin 16 (oz) 0.25 2 x Per Day/15 Days Discharge Instructions: Use as directed. Cleanser: Normal Saline 2 x Per GLO/75 Days Discharge  Instructions: Wash your hands with soap and water. Remove old dressing, discard into plastic bag and place into trash. Cleanse the Joel with Normal Saline prior to applying a clean dressing using gauze sponges, not tissues or cotton balls. Do not scrub or use excessive force. Pat dry using gauze sponges, not tissue or cotton balls. Cleanser: Joel Cleanser 2 x Per Day/15 Days Discharge Instructions: Wash your hands with soap and water. Remove old dressing, discard into plastic bag and place into trash. Cleanse the Joel with Joel Cleanser prior to applying a clean dressing using gauze sponges, not tissues or cotton balls. Do not scrub or use excessive force. Pat dry using gauze sponges, not tissue or cotton balls. Primary Dressing: Gauze 2 x Per Day/15 Days Discharge Instructions: Dakins lightly soaked gauze lightly packed-As directed: dry, moistened with saline or moistened with Dakins Solution Secondary Dressing: ABD Pad 5x9 (in/in) 2 x Per Day/15 Days Discharge Instructions: Cover with ABD pad Secured With: 29M ACE Elastic Bandage With VELCRO Brand Closure, 4 (in) 2 x Per Day/15 Days Discharge Instructions: PRN to keep dressing in place Secured With: 29M Medipore H Soft Cloth Surgical Tape, 2x2 (in/yd) 2 x Per Day/15 Days Secured With: Kerlix Roll Sterile or Non-Sterile 6-ply 4.5x4 (yd/yd) 2 x Per Day/15 Days Discharge Instructions: Apply Kerlix as directed 1. Dakins wet-to-dry dressings 2. Follow-up in 2 weeks Electronic Signature(s) Signed: 06/16/2021 12:44:19 PM By: Kalman Shan DO Entered By: Kalman Shan on 06/16/2021 12:42:53 [name] ([id number]) -------------------------------------------------------------------------------- SuperBill Details Patient Name: [name] Date of Service: 06/15/2021 Medical Record Number: [id number] Patient Account Number: [id number] Date of Birth/Sex: [birth year] (61 y.o. 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 Facility Procedures CPT4 Code: [id number] Description: 99213 - Joel CARE VISIT-LEV 3 EST PT Modifier: Quantity: 1 Physician Procedures CPT4: Description Modifier Quantity Code [id number] 99213 - WC PHYS LEVEL 3 - EST PT 1 CPT4: ICD-10 Diagnosis Description L97.826 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

## 2021-06-22 ENCOUNTER — Encounter: Payer: Self-pay | Admitting: Psychiatry

## 2021-06-22 ENCOUNTER — Telehealth: Payer: Self-pay | Admitting: Psychiatry

## 2021-06-22 ENCOUNTER — Encounter: Payer: HMO | Admitting: Internal Medicine

## 2021-06-22 DIAGNOSIS — F331 Major depressive disorder, recurrent, moderate: Secondary | ICD-10-CM

## 2021-06-22 MED ORDER — OLANZAPINE 7.5 MG PO TABS: 7.5000 mg | ORAL_TABLET | Freq: Every day | ORAL | 1 refills | Status: DC

## 2021-06-22 NOTE — Telephone Encounter (Signed)
Contacted mother since she requested a call back.  Discussed dosage of the olanzapine.  Made correction and sent a new prescription to the pharmacy for olanzapine 7.5 mg daily.

## 2021-06-22 NOTE — Telephone Encounter (Signed)
patient/mother came by, wants to know about medications. Please give them a call  [phone]

## 2021-06-24 DIAGNOSIS — E1029 Type 1 diabetes mellitus with other diabetic kidney complication: Secondary | ICD-10-CM | POA: Diagnosis not present

## 2021-06-24 DIAGNOSIS — R809 Proteinuria, unspecified: Secondary | ICD-10-CM | POA: Diagnosis not present

## 2021-06-24 DIAGNOSIS — Z89512 Acquired absence of left leg below knee: Secondary | ICD-10-CM | POA: Diagnosis not present

## 2021-06-29 ENCOUNTER — Other Ambulatory Visit: Payer: Self-pay

## 2021-06-29 ENCOUNTER — Encounter (HOSPITAL_BASED_OUTPATIENT_CLINIC_OR_DEPARTMENT_OTHER): Payer: HMO | Admitting: Internal Medicine

## 2021-06-29 DIAGNOSIS — T8131XA Disruption of external operation (surgical) wound, not elsewhere classified, initial encounter: Secondary | ICD-10-CM

## 2021-06-29 DIAGNOSIS — Z89512 Acquired absence of left leg below knee: Secondary | ICD-10-CM | POA: Diagnosis not present

## 2021-06-29 DIAGNOSIS — E10622 Type 1 diabetes mellitus with other skin ulcer: Secondary | ICD-10-CM | POA: Diagnosis not present

## 2021-06-29 DIAGNOSIS — L97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: Secondary | ICD-10-CM | POA: Diagnosis not present

## 2021-06-29 NOTE — Progress Notes (Signed)
MARCELLIS, FRAMPTON ([id number]) Visit Report for 06/29/2021 Chief Complaint Document Details Patient Name: [name], [name]. Date of Service: 06/29/2021 3:15 PM Medical Record Number: [id number] Patient Account Number: [id number] Date of Birth/Sex: 05/08/1961 (61 y.o. M) Treating RN: Donnamarie Poag Primary Care Provider: Derinda Late Other Clinician: Referring Provider: Derinda Late Treating Provider/Extender: Yaakov Guthrie in Treatment: 5 Information Obtained from: Patient Chief Complaint Left lower extremity amputation site wound Electronic Signature(s) Signed: 06/29/2021 4:21:22 PM By: Kalman Shan DO Entered By: Kalman Shan on 06/29/2021 15:59:36 [name] ([id number]) -------------------------------------------------------------------------------- HPI Details Patient Name: [name] Date of Service: 06/29/2021 3:15 PM Medical Record Number: [id number] Patient Account Number: [id number] Date of Birth/Sex: [birth year] (61 y.o. M) Treating RN: Donnamarie Poag Primary Care Provider: Derinda Late Other Clinician: Referring Provider: Derinda Late Treating Provider/Extender: Yaakov Guthrie in Treatment: 5 History of Present Illness HPI Description: Admission 05/25/2021 Mr. Thelonious Kauffmann is a 61 year old male with a past medical history of uncontrolled type 1 diabetes on insulin with last hemoglobin A1c of 8.8, peripheral arterial disease, status post left BKA that presents to the clinic for a left BKA amputation site wound. Patient developed a diabetic foot infection on 04/01/2021 and ultimately underwent a left BKA on 04/12/2021. The wound site became infected and he underwent an IandD on 05/07/2021. The wound was left open at that time. He has been using wet-to-dry dressings for the past 3 weeks. He currently denies Systemic signs of infection. 1/25; patient presents for follow-up. He has been using Dakin's wet-to-dry dressings with improvement in wound  healing. He has no issues or complaints today. He denies signs of infection. 2/8; Patient presents for follow-up. He continues To use Dakin's wet-to-dry dressings. He has no issues or complaints today. He denies signs of infection. He follows up with his surgeon next week. 2/22; patient presents for follow-up. He continues to use Dakin's wet-to-dry dressings. He denies signs of infection. He saw his vascular surgeon on 06/24/2021. They took out the staples. Nothing further to report on. Electronic Signature(s) Signed: 06/29/2021 4:21:22 PM By: Kalman Shan DO Entered By: Kalman Shan on 06/29/2021 16:00:57 [name] ([id number]) -------------------------------------------------------------------------------- Physical Exam Details Patient Name: KACYN, SOUDER Date of Service: 06/29/2021 3:15 PM Medical Record Number: [id number] Patient Account Number: [id number] Date of Birth/Sex: May 05, 1961 (61 y.o. M) Treating RN: Donnamarie Poag Primary Care Provider: Derinda Late Other Clinician: Referring Provider: Derinda Late Treating Provider/Extender: Yaakov Guthrie in Treatment: 5 Constitutional . Psychiatric . Notes Left BKA stump with wound at the amputation site with mostly granulation tissue throughout. Improvement in depth. No probing to bone. Scant nonviable tissue present To the areas of undermining. No signs of surrounding tissue infection. Electronic Signature(s) Signed: 06/29/2021 4:21:22 PM By: Kalman Shan DO Entered By: Kalman Shan on 06/29/2021 16:01:49 [name] ([id number]) -------------------------------------------------------------------------------- Physician Orders Details Patient Name: [name] Date of Service: 06/29/2021 3:15 PM Medical Record Number: [id number] Patient Account Number: [id number] Date of Birth/Sex: 1961-02-17 (61 y.o. M) Treating RN: Donnamarie Poag Primary Care Provider: Derinda Late Other Clinician: Referring  Provider: Derinda Late Treating Provider/Extender: Yaakov Guthrie in Treatment: 5 Verbal / Phone Orders: No Diagnosis Coding Follow-up Appointments o Return Appointment in 2 weeks. [place] for wound care. May utilize formulary equivalent dressing for wound treatment orders unless otherwise specified. Home Health Nurse may visit PRN to address patientos wound care needs. o Scheduled days for dressing changes to be  completed; exception, patient has scheduled wound care visit that day. o **Please direct any NON-WOUND related issues/requests for orders to patient's Primary Care Physician. **If current dressing causes regression in wound condition, may D/C ordered dressing product/s and apply Normal Saline Moist Dressing daily until next [place] or Other MD appointment. **Notify [facility] of regression in wound condition at [phone]. Bathing/ Shower/ Hygiene o No tub bath. Anesthetic (Use 'Patient Medications' Section for Anesthetic Order Entry) o Lidocaine applied to wound bed Edema Control - Lymphedema / Segmental Compressive Device / Other o DO YOUR BEST to sleep in the bed at night. DO NOT sleep in your recliner. Long hours of sitting in a recliner leads to swelling of the legs and/or potential wounds on your backside. Additional Orders / Instructions o Follow Nutritious Diet and Increase Protein Intake - monitor blood sugar to promote wound healing o Other: - Keep appointment with surgeon as they had scheduled Wound Treatment Wound #1 - Amputation Site - Below Knee Wound Laterality: Left Cleanser: Dakin 16 (oz) 0.25 2 x Per Day/15 Days Discharge Instructions: Use as directed. Cleanser: Normal Saline 2 x Per UXL/24 Days Discharge Instructions: Wash your hands with soap and water. Remove old dressing, discard into plastic bag and place into trash. Cleanse the wound with  Normal Saline prior to applying a clean dressing using gauze sponges, not tissues or cotton balls. Do not scrub or use excessive force. Pat dry using gauze sponges, not tissue or cotton balls. Cleanser: Wound Cleanser 2 x Per Day/15 Days Discharge Instructions: Wash your hands with soap and water. Remove old dressing, discard into plastic bag and place into trash. Cleanse the wound with Wound Cleanser prior to applying a clean dressing using gauze sponges, not tissues or cotton balls. Do not scrub or use excessive force. Pat dry using gauze sponges, not tissue or cotton balls. Primary Dressing: Gauze 2 x Per MWN/02 Days Discharge Instructions: Dakins lightly soaked gauze lightly packed-As directed: dry, moistened with saline or moistened with Dakins Solution Secondary Dressing: ABD Pad 5x9 (in/in) 2 x Per Day/15 Days Discharge Instructions: Cover with ABD pad Secured With: 229M ACE Elastic Bandage With VELCRO Brand Closure, 4 (in) 2 x Per Day/15 Days Discharge Instructions: PRN to keep dressing in place Secured With: 229M Medipore H Soft Cloth Surgical Tape, 2x2 (in/yd) 2 x Per Day/15 Days Secured With: [employer] Sterile or Non-Sterile 6-ply 4.5x4 (yd/yd) 2 x Per Day/15 Days Discharge Instructions: Apply Kerlix as directed NORWOOD, QUEZADA ([id number]) Electronic Signature(s) Signed: 06/29/2021 4:21:22 PM By: Kalman Shan DO Entered By: Kalman Shan on 06/29/2021 16:04:02 [name] ([id number]) -------------------------------------------------------------------------------- Problem List Details Patient Name: [name] Date of Service: 06/29/2021 3:15 PM Medical Record Number: [id number] Patient Account Number: [id number] Date of Birth/Sex: [birth year] (61 y.o. 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 Date of Service: 06/29/2021 3:15 PM Medical Record Number: [id number] Patient Account Number: [id number] Date of Birth/Sex: [birth year] (61 y.o. M) Treating RN: Donnamarie Poag Primary Care Provider: Derinda Late Other Clinician: Referring Provider: Derinda Late Treating Provider/Extender: Yaakov Guthrie in Treatment: 5 Subjective Chief Complaint Information obtained from Patient Left lower extremity amputation site wound History of Present Illness (HPI) Admission 05/25/2021 Mr. Brandy Kabat is a 61 year old male with a past medical history of uncontrolled type 1 diabetes on insulin with last hemoglobin A1c of 8.8, peripheral arterial disease, status post left BKA that presents to the clinic for a left BKA amputation site wound. Patient developed a diabetic foot infection on 04/01/2021 and ultimately underwent a left BKA on 04/12/2021. The wound site became infected and he underwent an IandD on 05/07/2021. The wound was left open at that time. He has been using wet-to-dry dressings for the past 3 weeks. He currently denies Systemic signs of infection. 1/25; patient presents for follow-up. He has been  using Dakin's wet-to-dry dressings with improvement in wound healing. He has no issues or complaints today. He denies signs of infection. 2/8; Patient presents for follow-up. He continues To use Dakin's wet-to-dry dressings. He has no issues or complaints today. He denies signs of infection. He follows up with his surgeon next week. 2/22; patient presents for follow-up. He continues to use Dakin's wet-to-dry dressings. He denies signs of infection. He saw his vascular surgeon on 06/24/2021. They took out the staples. Nothing further to report on. Objective Constitutional Vitals Time Taken: 3:03 PM, Height: 71 in, Weight: 250 lbs, BMI: 34.9, Temperature: 98.2 F, Pulse: 73 bpm, Respiratory Rate: 16 breaths/min, Blood Pressure: 134/76 mmHg. General Notes: Left BKA stump with wound at the amputation site with mostly granulation tissue throughout. Improvement in depth. No probing to bone. Scant nonviable tissue present To the areas of undermining. No signs of surrounding tissue infection. Integumentary (Hair, Skin) Wound #1 status is Open. Original cause of wound was Surgical Injury. The date acquired was: 04/12/2021. The wound has been in treatment 5 weeks. The wound is located on the Left Amputation Site - Below Knee. The wound measures 4cm length x 6.8cm width x 1.5cm depth; 21.363cm^2 area and 32.044cm^3 volume. There is bone, muscle, tendon, and Fat Layer (Subcutaneous Tissue) exposed. There is no tunneling noted, however, there is undermining starting at 11:00 and ending at 1:00 with a maximum distance of 2.4cm. There is a large amount of serosanguineous drainage noted. There is large (67-100%) red granulation within the wound bed. There is a small (1-33%) amount of necrotic tissue within the wound bed including Adherent Slough. 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 ([id number]) Patient's wound has shown improvement in size and appearance since last clinic visit. There is still some nonviable tissue to the areas of undermining and I recommended continuing with Dakin's wet-to-dry dressing. No signs of surrounding infection. I think we are very close and starting a wound VAC. Hopefully at follow-up in 2 weeks. Plan Follow-up Appointments: Return Appointment in 2 weeks. Home Health: [place]: - [place] for wound care. May utilize formulary equivalent dressing for wound treatment orders unless otherwise specified. Home Health Nurse may  visit PRN to address patient s wound care needs. Scheduled days for dressing changes to be completed; exception, patient has scheduled wound care visit that day. **Please direct any NON-WOUND related issues/requests for orders to patient's Primary Care Physician. **If current dressing causes regression in wound condition, may D/C ordered dressing product/s and apply Normal Saline Moist Dressing daily until next [place] or Other MD appointment. **Notify W[facility]aling Center of regression in wound condition at [phone]. Bathing/ Shower/ Hygiene: No tub bath. Anesthetic (Use 'Patient Medications' Section for Anesthetic Order Entry): Lidocaine applied to wound bed Edema Control - Lymphedema / Segmental Compressive Device / Other: DO YOUR BEST to sleep in the bed at night. DO NOT sleep in your recliner. Long hours of sitting in a recliner leads to swelling of the legs and/or potential wounds on your backside. Additional Orders / Instructions: Follow Nutritious Diet and Increase Protein Intake - monitor blood sugar to promote wound healing Other: - Keep appointment with surgeon as they had  scheduled WOUND #1: - Amputation Site - Below Knee Wound Laterality: Left Cleanser: Dakin 16 (oz) 0.25 2 x Per Day/15 Days Discharge Instructions: Use as directed. Cleanser: Normal Saline 2 x Per NAT/55 Days Discharge Instructions: Wash your hands with soap and water. Remove old dressing, discard into plastic bag and place into trash. Cleanse the wound with Normal Saline prior to applying a clean dressing using gauze sponges, not tissues or cotton balls. Do not scrub or use excessive force. Pat dry using gauze sponges, not tissue or cotton balls. Cleanser: Wound Cleanser 2 x Per Day/15 Days Discharge Instructions: Wash your hands with soap and water. Remove old dressing, discard into plastic bag and place into trash. Cleanse the wound with Wound Cleanser prior to applying a clean dressing using gauze sponges, not tissues or cotton balls. Do not scrub or use excessive force. Pat dry using gauze sponges, not tissue or cotton balls. Primary Dressing: Gauze 2 x Per Day/15 Days Discharge Instructions: Dakins lightly soaked gauze lightly packed-As directed: dry, moistened with saline or moistened with Dakins Solution Secondary Dressing: ABD Pad 5x9 (in/in) 2 x Per Day/15 Days Discharge Instructions: Cover with ABD pad Secured With: 65M ACE Elastic Bandage With VELCRO Brand Closure, 4 (in) 2 x Per Day/15 Days Discharge Instructions: PRN to keep dressing in place Secured With: 65M Medipore H Soft Cloth Surgical Tape, 2x2 (in/yd) 2 x Per Day/15 Days Secured With: Kerlix Roll Sterile or Non-Sterile 6-ply 4.5x4 (yd/yd) 2 x Per Day/15 Days Discharge Instructions: Apply Kerlix as directed 1. Dakin's wet-to-dry 2. Follow-up in 2 weeks Electronic Signature(s) Signed: 06/29/2021 4:21:22 PM By: Kalman Shan DO Entered By: Kalman Shan on 06/29/2021 16:03:15 [name] ([id number]) -------------------------------------------------------------------------------- SuperBill Details Patient Name:  [name] Date of Service: 06/29/2021 Medical Record Number: [id number] Patient Account Number: [id number] Date of Birth/Sex: 11-03-1960 (61 y.o. 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 Facility Procedures CPT4 Code: [id number] Description: 99213 - WOUND CARE VISIT-LEV 3 EST PT Modifier: Quantity: 1 Physician Procedures CPT4: Description Modifier Quantity Code [id number] - WC PHYS LEVEL 3 -  EST PT 1 CPT4: ICD-10 Diagnosis Description L97.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

## 2021-06-29 NOTE — Progress Notes (Signed)
QUINTARIUS, FERNS ([id number]) Visit Report for 06/29/2021 Arrival Information Details Patient Name: [name], [name]. Date of Service: 06/29/2021 3:15 PM Medical Record Number: [id number] Patient Account Number: [id number] Date of Birth/Sex: [name] (61 y.o. M) Treating RN: Donnamarie Poag Primary Care Hester Forget: Derinda Late Other Clinician: Referring Heidie Krall: Derinda Late Treating Tityana Pagan/Extender: Yaakov Guthrie in Treatment: 5 Visit Information History Since Last Visit Added or deleted any medications: No Patient Arrived: Wheel Chair Had a fall or experienced change in No Arrival Time: 15:01 activities of daily living that may affect Accompanied By: mother risk of falls: Transfer Assistance: EasyPivot Patient Lift Hospitalized since last visit: No Patient Identification Verified: Yes Has Dressing in Place as Prescribed: Yes Secondary Verification Process Completed: Yes Pain Present Now: No Patient Requires Transmission-Based No Precautions: Patient Has Alerts: Yes Patient Alerts: Patient on Blood Thinner Eliquis Diabetic Electronic Signature(s) Signed: 06/29/2021 4:40:06 PM By: Donnamarie Poag Entered By: Donnamarie Poag on 06/29/2021 15:04:51 [name] ([id number]) -------------------------------------------------------------------------------- Clinic Level of Care Assessment Details Patient Name: [name] Date of Service: 06/29/2021 3:15 PM Medical Record Number: [id number] Patient Account Number: [id number] Date of Birth/Sex: [name] (61 y.o. M) Treating RN: Donnamarie Poag Primary Care Brooklin Rieger: Derinda Late Other Clinician: Referring Weber Monnier: Derinda Late Treating Kylyn Mcdade/Extender: Yaakov Guthrie in Treatment: 5 Clinic Level of Care Assessment Items TOOL 4 Quantity Score '[]'  - Use when only an EandM is performed on FOLLOW-UP visit 0 ASSESSMENTS - Nursing Assessment / Reassessment '[]'  - Reassessment of Co-morbidities (includes updates in  patient status) 0 '[]'  - 0 Reassessment of Adherence to Treatment Plan ASSESSMENTS - Wound and Skin Assessment / Reassessment X - Simple Wound Assessment / Reassessment - one wound 1 5 '[]'  - 0 Complex Wound Assessment / Reassessment - multiple wounds '[]'  - 0 Dermatologic / Skin Assessment (not related to wound area) ASSESSMENTS - Focused Assessment '[]'  - Circumferential Edema Measurements - multi extremities 0 '[]'  - 0 Nutritional Assessment / Counseling / Intervention '[]'  - 0 Lower Extremity Assessment (monofilament, tuning fork, pulses) '[]'  - 0 Peripheral Arterial Disease Assessment (using hand held doppler) ASSESSMENTS - Ostomy and/or Continence Assessment and Care '[]'  - Incontinence Assessment and Management 0 '[]'  - 0 Ostomy Care Assessment and Management (repouching, etc.) PROCESS - Coordination of Care X - Simple Patient / Family Education for ongoing care 1 15 '[]'  - 0 Complex (extensive) Patient / Family Education for ongoing care '[]'  - 0 Staff obtains Programmer, systems, Records, Test Results / Process Orders X- 1 10 Staff telephones HHA, Nursing Homes / Clarify orders / etc '[]'  - 0 Routine Transfer to another Facility (non-emergent condition) '[]'  - 0 Routine Hospital Admission (non-emergent condition) '[]'  - 0 New Admissions / Biomedical engineer / Ordering NPWT, Apligraf, etc. '[]'  - 0 Emergency Hospital Admission (emergent condition) X- 1 10 Simple Discharge Coordination '[]'  - 0 Complex (extensive) Discharge Coordination PROCESS - Special Needs '[]'  - Pediatric / Minor Patient Management 0 '[]'  - 0 Isolation Patient Management '[]'  - 0 Hearing / Language / Visual special needs '[]'  - 0 Assessment of Community assistance (transportation, D/C planning, etc.) '[]'  - 0 Additional assistance / Altered mentation '[]'  - 0 Support Surface(s) Assessment (bed, cushion, seat, etc.) INTERVENTIONS - Wound Cleansing / Measurement [name], [name]. ([id number]) X- 1 5 Simple Wound Cleansing - one  wound '[]'  - 0 Complex Wound Cleansing - multiple wounds X- 1 5 Wound Imaging (photographs - any number of wounds) '[]'  - 0 Wound Tracing (instead of photographs) X- 1 5 Simple Wound  Measurement - one wound '[]'  - 0 Complex Wound Measurement - multiple wounds INTERVENTIONS - Wound Dressings '[]'  - Small Wound Dressing one or multiple wounds 0 '[]'  - 0 Medium Wound Dressing one or multiple wounds X- 1 20 Large Wound Dressing one or multiple wounds '[]'  - 0 Application of Medications - topical '[]'  - 0 Application of Medications - injection INTERVENTIONS - Miscellaneous '[]'  - External ear exam 0 '[]'  - 0 Specimen Collection (cultures, biopsies, blood, body fluids, etc.) '[]'  - 0 Specimen(s) / Culture(s) sent or taken to Lab for analysis '[]'  - 0 Patient Transfer (multiple staff / Civil Service fast streamer / Similar devices) '[]'  - 0 Simple Staple / Suture removal (25 or less) '[]'  - 0 Complex Staple / Suture removal (26 or more) '[]'  - 0 Hypo / Hyperglycemic Management (close monitor of Blood Glucose) '[]'  - 0 Ankle / Brachial Index (ABI) - do not check if billed separately X- 1 5 Vital Signs Has the patient been seen at the hospital within the last three years: Yes Total Score: 80 Level Of Care: New/Established - Level 3 Electronic Signature(s) Signed: 06/29/2021 4:40:06 PM By: Donnamarie Poag Entered By: Donnamarie Poag on 06/29/2021 15:41:12 [name] ([id number]) -------------------------------------------------------------------------------- Encounter Discharge Information Details Patient Name: [name] Date of Service: 06/29/2021 3:15 PM Medical Record Number: [id number] Patient Account Number: [id number] Date of Birth/Sex: [birth year] (61 y.o. M) Treating RN: Donnamarie Poag Primary Care Bernis Stecher: Derinda Late Other Clinician: Referring Jeric Slagel: Derinda Late Treating Shatoya Roets/Extender: Yaakov Guthrie in Treatment: 5 Encounter Discharge Information Items Discharge Condition:  Stable Ambulatory Status: Wheelchair Discharge Destination: Home Transportation: Private Auto Accompanied By: mother Schedule Follow-up Appointment: Yes Clinical Summary of Care: Electronic Signature(s) Signed: 06/29/2021 4:40:06 PM By: Donnamarie Poag Entered By: Donnamarie Poag on 06/29/2021 15:43:07 [name] ([id number]) -------------------------------------------------------------------------------- Lower Extremity Assessment Details Patient Name: [name] Date of Service: 06/29/2021 3:15 PM Medical Record Number: [id number] Patient Account Number: [id number] Date of Birth/Sex: [name] (61 y.o. M) Treating RN: Donnamarie Poag Primary Care Karlon Schlafer: Derinda Late Other Clinician: Referring Polina Burmaster: Derinda Late Treating Azizah Lisle/Extender: Yaakov Guthrie in Treatment: 5 Edema Assessment Assessed: [name]: Yes] [Right: No] Edema: [Left: N] [Right: o] Notes L BKA Electronic Signature(s) Signed: 06/29/2021 4:40:06 PM By: Donnamarie Poag Entered By: Donnamarie Poag on 06/29/2021 15:11:27 [name] ([id number]) -------------------------------------------------------------------------------- Multi Wound Chart Details Patient Name: [name] Date of Service: 06/29/2021 3:15 PM Medical Record Number: [id number] Patient Account Number: [id number] Date of Birth/Sex: [name], [name] (61 y.o. 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 N/A N/A Wounding Event: Surgical Injury N/A N/A Primary Etiology: Open Surgical Wound N/A N/A Secondary Etiology: Diabetic Wound/Ulcer of the Lower N/A  N/A Extremity Comorbid History: Coronary Artery Disease, N/A N/A Hypertension, Type I Diabetes Date Acquired: 04/12/2021 N/A N/A Weeks of Treatment: 5 N/A N/A Wound Status: Open N/A N/A Wound Recurrence: No N/A N/A Pending Amputation on Yes N/A N/A Presentation: Measurements L x W x D (cm) 4x6.8x1.5 N/A N/A Area (cm) : 21.363 N/A N/A Volume (cm) : 32.044 N/A N/A % Reduction in Area: 69.80% N/A N/A % Reduction in Volume: 84.40% N/A N/A Starting Position 1 (o'clock): 11 Ending Position 1 (o'clock): 1 Maximum Distance 1 (cm): 2.4 Undermining: Yes  N/A N/A Classification: Full Thickness With Exposed N/A N/A Support Structures Exudate Amount: Large N/A N/A Exudate Type: Serosanguineous N/A N/A Exudate Color: red, brown N/A N/A Granulation Amount: Large (67-100%) N/A N/A Granulation Quality: Red N/A N/A Necrotic Amount: Small (1-33%) N/A N/A Exposed Structures: Fat Layer (Subcutaneous Tissue): N/A N/A Yes Tendon: Yes Muscle: Yes Bone: Yes Fascia: No Joint: No Epithelialization: None N/A N/A Treatment Notes [name], [name] ([id number]) Electronic Signature(s) Signed: 06/29/2021 4:40:06 PM By: Donnamarie Poag Entered By: Donnamarie Poag on 06/29/2021 15:37:35 [name] ([id number]) -------------------------------------------------------------------------------- Multi-Disciplinary Care Plan Details Patient Name: [name] Date of Service: 06/29/2021 3:15 PM Medical Record Number: [id number] Patient Account Number: [id number] Date of Birth/Sex: [name] (61 y.o. M) Treating RN: Donnamarie Poag Primary Care Emely Fahy: Derinda Late Other Clinician: Referring Emmanuelle Coxe: Derinda Late Treating Laretha Luepke/Extender: Yaakov Guthrie in Treatment: 5 Active Inactive Wound/Skin Impairment Nursing Diagnoses: Impaired tissue integrity Knowledge deficit related to smoking impact on wound healing Knowledge deficit related to ulceration/compromised skin  integrity Goals: Patient/caregiver will verbalize understanding of skin care regimen Date Initiated: 05/25/2021 Date Inactivated: 06/15/2021 Target Resolution Date: 07/04/2021 Goal Status: Met Ulcer/skin breakdown will have a volume reduction of 30% by week 4 Date Initiated: 05/25/2021 Date Inactivated: 06/29/2021 Target Resolution Date: 06/22/2021 Goal Status: Met Ulcer/skin breakdown will have a volume reduction of 50% by week 8 Date Initiated: 05/25/2021 Target Resolution Date: 07/20/2021 Goal Status: Active Ulcer/skin breakdown will have a volume reduction of 80% by week 12 Date Initiated: 05/25/2021 Target Resolution Date: 08/10/2021 Goal Status: Active Ulcer/skin breakdown will heal within 14 weeks Date Initiated: 05/25/2021 Target Resolution Date: 09/07/2021 Goal Status: Active Interventions: Assess patient/caregiver ability to obtain necessary supplies Assess patient/caregiver ability to perform ulcer/skin care regimen upon admission and as needed Assess ulceration(s) every visit Notes: Electronic Signature(s) Signed: 06/29/2021 4:40:06 PM By: Donnamarie Poag Entered By: Donnamarie Poag on 06/29/2021 15:12:02 [name] ([id number]) -------------------------------------------------------------------------------- Pain Assessment Details Patient Name: [name] Date of Service: 06/29/2021 3:15 PM Medical Record Number: [id number] Patient Account Number: [id number] Date of Birth/Sex: [name] (61 y.o. M) Treating RN: Donnamarie Poag Primary Care Bridgid Printz: Derinda Late Other Clinician: Referring Arno Cullers: Derinda Late Treating Benino Korinek/Extender: Yaakov Guthrie in Treatment: 5 Active Problems Location of Pain Severity and Description of Pain Patient Has Paino No Site Locations Rate the pain. Current Pain Level: 0 Pain Management and Medication Current Pain Management: Electronic Signature(s) Signed: 06/29/2021 4:40:06 PM By: Donnamarie Poag Entered By: Donnamarie Poag on  06/29/2021 15:05:26 [name] ([id number]) -------------------------------------------------------------------------------- Patient/Caregiver Education Details Patient Name: [name] Date of Service: 06/29/2021 3:15 PM Medical Record Number: [id number] Patient Account Number: [id number] Date of Birth/Gender: [name] (61 y.o. M) Treating RN: Donnamarie Poag Primary Care Physician: Derinda Late Other Clinician: Referring Physician: Derinda Late Treating Physician/Extender: Yaakov Guthrie in Treatment: 5 Education Assessment Education Provided To: Patient Education Topics Provided Basic Hygiene: Wound/Skin Impairment: Electronic Signature(s) Signed: 06/29/2021 4:40:06 PM By: Donnamarie Poag Entered By: Donnamarie Poag on 06/29/2021 15:41:31 [name] ([id number]) -------------------------------------------------------------------------------- Wound Assessment Details Patient Name: [name] Date of Service: 06/29/2021 3:15 PM Medical Record Number: [id number] Patient Account Number: [id number] Date of Birth/Sex: [birth year] [name] (61 y.o. 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 Etiology: Wounding Event: Surgical Injury  Wound Status: Open Date Acquired: 04/12/2021 Comorbid History: Coronary Artery Disease, Hypertension, Type I Weeks Of Treatment: 5 Diabetes Clustered Wound: No Pending Amputation On Presentation Photos Wound Measurements Length: (cm) 4 Width: (cm) 6.8 Depth: (cm) 1.5 Area: (cm) 21.363 Volume: (cm) 32.044 % Reduction in Area: 69.8% % Reduction in Volume: 84.4% Epithelialization: None Tunneling: No Undermining:  Yes Starting Position (o'clock): 11 Ending Position (o'clock): 1 Maximum Distance: (cm) 2.4 Wound Description Classification: Full Thickness With Exposed Support Structures Exudate Amount: Large Exudate Type: Serosanguineous Exudate Color: red, brown Foul Odor After Cleansing: No Slough/Fibrino Yes Wound Bed Granulation Amount: Large (67-100%) Exposed Structure Granulation Quality: Red Fascia Exposed: No Necrotic Amount: Small (1-33%) Fat Layer (Subcutaneous Tissue) Exposed: Yes Necrotic Quality: Adherent Slough Tendon Exposed: Yes Muscle Exposed: Yes Necrosis of Muscle: No Joint Exposed: No Bone Exposed: Yes Treatment Notes Wound #1 (Amputation Site - Below Knee) Wound Laterality: Left [name], [name] ([id number]) Cleanser Dakin 16 (oz) 0.25 Discharge Instruction: Use as directed. Normal Saline Discharge Instruction: Wash your hands with soap and water. Remove old dressing, discard into plastic bag and place into trash. Cleanse the wound with Normal Saline prior to applying a clean dressing using gauze sponges, not tissues or cotton balls. Do not scrub or use excessive force. Pat dry using gauze sponges, not tissue or cotton balls. Wound Cleanser Discharge Instruction: Wash your hands with soap and water. Remove old dressing, discard into plastic bag and place into trash. Cleanse the wound with Wound Cleanser prior to applying a clean dressing using gauze sponges, not tissues or cotton balls. Do not scrub or use excessive force. Pat dry using gauze sponges, not tissue or cotton balls. Peri-Wound Care Topical Primary Dressing Gauze Discharge Instruction: Dakins lightly soaked gauze lightly packed-As directed: dry, moistened with saline or moistened with Dakins Solution Secondary Dressing ABD Pad 5x9 (in/in) Discharge Instruction: Cover with ABD pad Secured With 172M ACE Elastic Bandage With VELCRO Brand Closure, 4 (in) Discharge Instruction: PRN to keep dressing in  place 172M [place] Surgical Tape, 2x2 (in/yd) Kerlix Roll Sterile or Non-Sterile 6-ply 4.5x4 (yd/yd) Discharge Instruction: Apply Kerlix as directed Compression Wrap Compression Stockings Add-Ons Electronic Signature(s) Signed: 06/29/2021 4:40:06 PM By: Donnamarie Poag Entered By: Donnamarie Poag on 06/29/2021 15:11:00 [name] ([id number]) -------------------------------------------------------------------------------- [place] Details Patient Name: [name] Date of Service: 06/29/2021 3:15 PM Medical Record Number: [id number] Patient Account Number: [id number] Date of Birth/Sex: [birth year] (61 y.o. M) Treating RN: Donnamarie Poag Primary Care Billyjack Trompeter: Derinda Late Other Clinician: Referring Emer Onnen: Derinda Late Treating Naleah Kofoed/Extender: Yaakov Guthrie in Treatment: 5 Vital Signs Time Taken: 15:03 Temperature (F): 98.2 Holder (in): 71 Pulse (bpm): 73 Weight (lbs): 250 Respiratory Rate (breaths/min): 16 Body Mass Index (BMI): 34.9 Blood Pressure (mmHg): 134/76 Reference Range: 80 - 120 mg / dl Electronic Signature(s) Signed: 06/29/2021 4:40:06 PM By: Donnamarie Poag Entered ByDonnamarie Poag on 06/29/2021 15:05:16

## 2021-07-04 DIAGNOSIS — Z8679 Personal history of other diseases of the circulatory system: Secondary | ICD-10-CM | POA: Diagnosis not present

## 2021-07-04 DIAGNOSIS — Z930 Tracheostomy status: Secondary | ICD-10-CM | POA: Diagnosis not present

## 2021-07-04 DIAGNOSIS — Z89512 Acquired absence of left leg below knee: Secondary | ICD-10-CM | POA: Diagnosis not present

## 2021-07-04 DIAGNOSIS — I70202 Unspecified atherosclerosis of native arteries of extremities, left leg: Secondary | ICD-10-CM | POA: Diagnosis not present

## 2021-07-05 ENCOUNTER — Ambulatory Visit (INDEPENDENT_AMBULATORY_CARE_PROVIDER_SITE_OTHER): Payer: HMO | Admitting: Psychiatry

## 2021-07-05 ENCOUNTER — Encounter: Payer: Self-pay | Admitting: Psychiatry

## 2021-07-05 ENCOUNTER — Other Ambulatory Visit: Payer: Self-pay

## 2021-07-05 VITALS — BP 123/68 | HR 68 | Temp 97.9°F

## 2021-07-05 DIAGNOSIS — I152 Hypertension secondary to endocrine disorders: Secondary | ICD-10-CM | POA: Diagnosis not present

## 2021-07-05 DIAGNOSIS — F5101 Primary insomnia: Secondary | ICD-10-CM

## 2021-07-05 DIAGNOSIS — F331 Major depressive disorder, recurrent, moderate: Secondary | ICD-10-CM

## 2021-07-05 DIAGNOSIS — Z430 Encounter for attention to tracheostomy: Secondary | ICD-10-CM | POA: Diagnosis not present

## 2021-07-05 DIAGNOSIS — T50905A Adverse effect of unspecified drugs, medicaments and biological substances, initial encounter: Secondary | ICD-10-CM

## 2021-07-05 DIAGNOSIS — F3342 Major depressive disorder, recurrent, in full remission: Secondary | ICD-10-CM

## 2021-07-05 DIAGNOSIS — N1832 Chronic kidney disease, stage 3b: Secondary | ICD-10-CM | POA: Diagnosis not present

## 2021-07-05 DIAGNOSIS — E1022 Type 1 diabetes mellitus with diabetic chronic kidney disease: Secondary | ICD-10-CM | POA: Diagnosis not present

## 2021-07-05 DIAGNOSIS — Z4781 Encounter for orthopedic aftercare following surgical amputation: Secondary | ICD-10-CM | POA: Diagnosis not present

## 2021-07-05 DIAGNOSIS — R635 Abnormal weight gain: Secondary | ICD-10-CM | POA: Diagnosis not present

## 2021-07-05 DIAGNOSIS — E1159 Type 2 diabetes mellitus with other circulatory complications: Secondary | ICD-10-CM | POA: Diagnosis not present

## 2021-07-05 DIAGNOSIS — Z930 Tracheostomy status: Secondary | ICD-10-CM | POA: Diagnosis not present

## 2021-07-05 DIAGNOSIS — F411 Generalized anxiety disorder: Secondary | ICD-10-CM

## 2021-07-05 DIAGNOSIS — I739 Peripheral vascular disease, unspecified: Secondary | ICD-10-CM | POA: Diagnosis not present

## 2021-07-05 DIAGNOSIS — Z89512 Acquired absence of left leg below knee: Secondary | ICD-10-CM | POA: Diagnosis not present

## 2021-07-05 NOTE — Progress Notes (Signed)
[facility] MD OP Progress Note  07/05/2021 1:34 PM [name]  MRN:  [id number]  Chief Complaint:  Chief Complaint  Patient presents with   Follow-up; 61 year old Caucasian male, married, has a history of MDD, GAD, insomnia, multiple medical problems including history of tracheal stenosis status post multiple reintubation, recent below-knee amputation-presented for follow-up medication management.   HPI: [name] is a 61 year old Caucasian male, married, lives in [place], has a history of MDD, GAD, insomnia, history of tracheal stenosis status post multiple reintubation, respiratory failure due to opioid overdose, history of CVA, right-sided hemiparesis, diabetes mellitus, hypertension, NSTEMI, hypothyroidism, chronic pain was evaluated in office today.  Patient as well as mother participated in the evaluation today.  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  Currently under the care of wound care reports wound is healing.  Patient today appeared to be pleasant, cooperative.  Reports his mood symptoms are improving.  His wife's daughter who lives in the same home is going to move out today and that does help with his mood.  Patient does report sadness and low mood due to not being able to get out of the house more often.  He does not have a lot going on activity wise and that does make him sad.  However reports the current medications as beneficial.  Started psychotherapy sessions again and reports therapy sessions are beneficial.  Reports he has difficulty falling asleep however the Ambien does help.  He was unable to pick up the Ambien prescription from the pharmacy.  Does have olanzapine at night which also helps with sleep.  Patient has been compliant on medications and denies side effects.  Denies suicidality although he does report some thoughts about not wanting to be here or better off dead.  However denies any active suicidal thoughts or plan.  Denies  any homicidality or perceptual disturbances.  As per collateral information obtained from [facility] -patient is currently doing well.  She has been setting up his medications in a pillbox on a weekly basis.  She also has safely locked his controlled substances like Klonopin and zolpidem.  Denies any other concerns today.  Visit Diagnosis:    ICD-10-CM   1. MDD (major depressive disorder), recurrent, in full remission ([place])  F33.42     2. GAD (generalized anxiety disorder)  F41.1     3. Primary insomnia  F51.01     4. Weight gain due to medication  R63.5    T50.905A    psychotropics likely      Past Psychiatric History: Reviewed past psychiatric history from progress note on 01/15/2019.  Past trials of fluoxetine, Rexulti, Xanax, Abilify.  Completed TMS-04/05/2020.  Patient with [place] sessions in the past prior to that.  Patient with history of ECT-did not tolerate it.  Patient with multiple inpatient mental health admissions in 2020.  Possible remote history of suicide attempt in 2014 however reports it was accidental and he did not intend to kill himself.  Past Medical History:  Past Medical History:  Diagnosis Date   Depression    Diabetes ([place])    Insulin Pump   Diabetes mellitus type I ([place])    Diabetes mellitus without complication ([place])    GERD (gastroesophageal reflux disease)    H/O laryngectomy    Heel bone fracture    Hyperlipidemia    Hypertension    Radicular pain of right lower extremity    Stroke [facility])    Suicide attempt ([place]) 2014  damaged larynx - tracheostomy   Thyroid disease     Past Surgical History:  Procedure Laterality Date   COLONOSCOPY WITH PROPOFOL N/A 05/15/2018   Procedure: COLONOSCOPY WITH PROPOFOL;  Surgeon: [name], [name], MD;  Location: [facility] ENDOSCOPY;  Service: Gastroenterology;  Laterality: N/A;   ESOPHAGOGASTRODUODENOSCOPY N/A 05/15/2018   Procedure: ESOPHAGOGASTRODUODENOSCOPY (EGD);  Surgeon: [name], [name], MD;  Location: [facility]  ENDOSCOPY;  Service: Gastroenterology;  Laterality: N/A;   FRACTURE SURGERY     Heel bone reconstruction Left    HERNIA REPAIR  50/0938   Umbilical hernia repair    LARYNGECTOMY     LOWER EXTREMITY ANGIOGRAPHY Left 01/31/2021   Procedure: LOWER EXTREMITY ANGIOGRAPHY;  Surgeon: Algernon Huxley, MD;  Location: [facility] CV LAB;  Service: Cardiovascular;  Laterality: Left;   LOWER EXTREMITY ANGIOGRAPHY Right 02/14/2021   Procedure: LOWER EXTREMITY ANGIOGRAPHY;  Surgeon: Algernon Huxley, MD;  Location: [facility] CV LAB;  Service: Cardiovascular;  Laterality: Right;   NECK SURGERY     fusion   SPINE SURGERY     TRACHEOSTOMY  2014   from [place] attempt    Family Psychiatric History: Reviewed family psychiatric history from progress note on 01/15/2019.  Family History:  Family History  Problem Relation Age of Onset   Osteoporosis Mother    Diabetes Mother    Hypertension Father    Mental illness Neg Hx     Social History: Reviewed social history from progress note on 01/15/2019. Social History   Socioeconomic History   Marital status: Married    Spouse name: [name]   Number of children: Not on file   Years of education: Not on file   Highest education level: Not on file  Occupational History   Not on file  Tobacco Use   Smoking status: Former    Packs/day: 0.00    Types: Cigarettes    Quit date: 04/09/2013    Years since quitting: 8.2   Smokeless tobacco: Never  Vaping Use   Vaping Use: Never used  Substance and Sexual Activity   Alcohol use: Yes    Alcohol/week: 2.0 standard drinks    Types: 2 Shots of liquor per week    Comment: rare   Drug use: No    Comment: Pt denied; UDS not available   Sexual activity: Yes    Partners: Female    Birth control/protection: Condom  Other Topics Concern   Not on file  Social History Narrative   Not on file   Social Determinants of Health   Financial Resource Strain: Not on file  Food Insecurity: Not on file  Transportation Needs:  Not on file  Physical Activity: Not on file  Stress: Not on file  Social Connections: Not on file    Allergies:  Allergies  Allergen Reactions   Buspar [Buspirone]     Makes the patient "flip out"   Depakote [Valproic Acid]     Causes excessive drowsiness   Clopidogrel Rash   Gabapentin Itching and Rash    Metabolic Disorder Labs: Lab Results  Component Value Date   HGBA1C 7.8 (H) 02/14/2021   MPG 177 02/14/2021   MPG 177.16 01/01/2019   No results found for: PROLACTIN Lab Results  Component Value Date   CHOL 161 09/15/2016   TRIG 190 (H) 09/15/2016   HDL 41 09/15/2016   CHOLHDL 3.9 09/15/2016   VLDL 38 09/15/2016   LDLCALC 82 09/15/2016   Lab Results  Component Value Date   TSH 1.952  09/15/2016    Therapeutic Level Labs: No results found for: LITHIUM Lab Results  Component Value Date   VALPROATE 19 (L) 09/12/2016   No components found for:  CBMZ  Current Medications: Current Outpatient Medications  Medication Sig Dispense Refill   amLODipine (NORVASC) 5 MG tablet Take 1 tablet (5 mg total) by mouth daily. 30 tablet 1   apixaban (ELIQUIS) 5 MG TABS tablet Take 1 tablet (5 mg total) by mouth 2 (two) times daily. 60 tablet 5   buPROPion (WELLBUTRIN XL) 300 MG 24 hr tablet Take 1 tablet (300 mg total) by mouth daily with breakfast. Next refill please 90 tablet 0   carvedilol (COREG) 25 MG tablet Take 1 tablet by mouth 2 (two) times daily.     clonazePAM (KLONOPIN) 1 MG tablet Take 1 tablet (1 mg total) by mouth 2 (two) times daily as needed. Take 1 tablet daily AM and 1 tablet daily at bedtime 60 tablet 1   empagliflozin (JARDIANCE) 25 MG TABS tablet Take 25 mg by mouth daily. 30 tablet 1   insulin aspart (NOVOLOG) 100 UNIT/ML injection INJECT UP TO 120 UNITS UNDER THE SKIN AS DIRECTED DAILY VIA INSULIN PUMP     levothyroxine (SYNTHROID) 75 MCG tablet TAKE 1 TABLET BY MOUTH DAILY AT 6 AM 90 tablet 0   OLANZapine (ZYPREXA) 7.5 MG tablet Take 1 tablet (7.5 mg  total) by mouth at bedtime. 30 tablet 1   pantoprazole (PROTONIX) 40 MG tablet TAKE 1 TABLET(40 MG) BY MOUTH TWICE DAILY     venlafaxine XR (EFFEXOR-XR) 150 MG 24 hr capsule Take 1 capsule (150 mg total) by mouth daily with breakfast. 90 capsule 0   zolpidem (AMBIEN) 10 MG tablet TAKE 1 TABLET(10 MG) BY MOUTH AT BEDTIME AS NEEDED FOR SLEEP 30 tablet 1   oxyCODONE-acetaminophen (PERCOCET) 10-325 MG tablet Take 1 tablet by mouth every 8 (eight) hours as needed for pain. (Patient not taking: Reported on 07/05/2021) 30 tablet 0   sodium hypochlorite (DAKIN'S 1/4 STRENGTH) 0.125 % SOLN USE IN WET TO DRY DRESSINGS     No current facility-administered medications for this visit.     Musculoskeletal: Strength & Muscle Tone:  UTA Gait & Station:  Wheelchair bound Patient leans: N/A  Psychiatric Specialty Exam: Review of Systems  Musculoskeletal:        Left lower leg - S/P BKA - wound healing  Psychiatric/Behavioral:  Positive for dysphoric mood and sleep disturbance.   All other systems reviewed and are negative.  Blood pressure 123/68, pulse 68, temperature 97.9 F (36.6 C), temperature source Temporal.There is no height or weight on file to calculate BMI.  General Appearance: Casual  Eye Contact:  Fair  Speech:  Clear and Coherent  Volume:  Normal  Mood:  Depressed improving  Affect:  Full Range  Thought Process:  Goal Directed and Descriptions of Associations: Intact  Orientation:  Full (Time, Place, and Person)  Thought Content: Logical   Suicidal Thoughts:  No  Homicidal Thoughts:  No  Memory:  Immediate;   Fair Recent;   Fair Remote;   Fair  Judgement:  Fair  Insight:  Fair  Psychomotor Activity:  Normal  Concentration:  Concentration: Fair and Attention Span: Fair  Recall:  [employer] of Knowledge: Fair  Language: Fair  Akathisia:  No  Handed:  Right  AIMS (if indicated): done, 0  Assets:  Communication Skills Desire for Improvement Housing Social  Support Transportation  ADL's:  Intact  Cognition: WNL  Sleep:   restless   Screenings: AIMS    Flowsheet Row Office Visit from 07/05/2021 in [facility] Office Visit from 08/03/2020 in [name] Total Score 0 0      AUDIT    Flowsheet Row Admission (Discharged) from 01/22/2019 in [facility] Admission (Discharged) from 12/31/2018 in [facility] Admission (Discharged) from 09/14/2016 in [facility]  Alcohol Use Disorder Identification Test Final Score (AUDIT) 0 0 1      ECT-MADRS    Flowsheet Row Admission (Discharged) from 01/22/2019 in [facility] Total Score 27      Mini-Mental    [place] Admission (Discharged) from 01/22/2019 in [facility]  Total Score (max 30 points ) 30      PHQ2-9    [place] Visit from 07/05/2021 in [facility] Office Visit from 06/02/2021 in [facility] Office Visit from 03/22/2021 in [place] Video Visit from 11/25/2020 in [facility] Video Visit from 10/26/2020 in [place]  PHQ-2 Total Score 3 5 0 0 3  PHQ-9 Total Score 8 15 -- 5 10      [place] Visit from 07/05/2021 in [facility] Office Visit from 06/02/2021 in [facility] Admission (Discharged) from 02/14/2021 in [facility] PCU  C-SSRS RISK CATEGORY Error: Q3, 4, or 5 should not be populated when Q2 is No Low Risk No Risk        Assessment and Plan: [name] is a 61 year old Caucasian male, married, on disability, history of multiple medical problems, MDD, GAD was evaluated in office today.  Patient status post below-knee amputation-left lower extremity,  currently is improving with regards to his mood.  Discussed plan as noted below.  Plan MDD-improving Wellbutrin XL 300 mg p.o. daily Effexor extended release 150 mg p.o. daily Continue olanzapine 7.5 mg p.o. nightly  GAD-improving Effexor extended release 150 mg p.o. daily Klonopin 1 mg p.o. twice daily as needed Reviewed [place] PMP aware Continue CBT with [name].  Insomnia-restless Currently does not have Ambien available however agrees to pick it up from pharmacy. Continue Ambien 10 mg p.o. nightly  Weight gain secondary to psychotropics-unstable Patient currently wheelchair-bound, currently working with physical therapist. We will continue to reduce the dosage of Zyprexa. Diet management recommended.  Collateral information obtained from mother who reports patient is improving as noted above.  Follow-up in clinic in 2 months or sooner if needed.    Collaboration of Care: Collaboration of Care: Referral or follow-up with counselor/therapist AEB encouraged to continue to follow up with [name]-therapist-has upcoming appointment.  Patient/Guardian was advised Release of Information must be obtained prior to any record release in order to collaborate their care with an outside provider. Patient/Guardian was advised if they have not already done so to contact the registration department to sign all necessary forms in order for [place] to release information regarding their care.   Consent: Patient/Guardian gives verbal consent for treatment and assignment of benefits for services provided during this visit. Patient/Guardian expressed understanding and agreed to proceed.   This note was generated in part or whole with voice recognition software. Voice recognition is usually quite accurate but there are transcription errors that can and very often do occur. I apologize for any typographical errors that were not detected and corrected.  Ursula Alert, MD 07/06/2021,  12:51 PM

## 2021-07-06 ENCOUNTER — Encounter: Payer: HMO | Admitting: Internal Medicine

## 2021-07-06 DIAGNOSIS — F419 Anxiety disorder, unspecified: Secondary | ICD-10-CM | POA: Diagnosis not present

## 2021-07-06 DIAGNOSIS — I10 Essential (primary) hypertension: Secondary | ICD-10-CM | POA: Diagnosis not present

## 2021-07-06 DIAGNOSIS — I739 Peripheral vascular disease, unspecified: Secondary | ICD-10-CM | POA: Diagnosis not present

## 2021-07-06 DIAGNOSIS — Z792 Long term (current) use of antibiotics: Secondary | ICD-10-CM | POA: Diagnosis not present

## 2021-07-06 DIAGNOSIS — Z4781 Encounter for orthopedic aftercare following surgical amputation: Secondary | ICD-10-CM | POA: Diagnosis not present

## 2021-07-06 DIAGNOSIS — E119 Type 2 diabetes mellitus without complications: Secondary | ICD-10-CM | POA: Diagnosis not present

## 2021-07-06 DIAGNOSIS — T148XXD Other injury of unspecified body region, subsequent encounter: Secondary | ICD-10-CM | POA: Diagnosis not present

## 2021-07-06 DIAGNOSIS — Z89512 Acquired absence of left leg below knee: Secondary | ICD-10-CM | POA: Diagnosis not present

## 2021-07-06 DIAGNOSIS — F32A Depression, unspecified: Secondary | ICD-10-CM | POA: Diagnosis not present

## 2021-07-06 DIAGNOSIS — Z7901 Long term (current) use of anticoagulants: Secondary | ICD-10-CM | POA: Diagnosis not present

## 2021-07-07 DIAGNOSIS — Z7902 Long term (current) use of antithrombotics/antiplatelets: Secondary | ICD-10-CM | POA: Diagnosis not present

## 2021-07-07 DIAGNOSIS — E039 Hypothyroidism, unspecified: Secondary | ICD-10-CM | POA: Diagnosis not present

## 2021-07-07 DIAGNOSIS — Z9002 Acquired absence of larynx: Secondary | ICD-10-CM | POA: Diagnosis not present

## 2021-07-07 DIAGNOSIS — I69351 Hemiplegia and hemiparesis following cerebral infarction affecting right dominant side: Secondary | ICD-10-CM | POA: Diagnosis not present

## 2021-07-07 DIAGNOSIS — Z448 Encounter for fitting and adjustment of other external prosthetic devices: Secondary | ICD-10-CM | POA: Diagnosis not present

## 2021-07-07 DIAGNOSIS — Z888 Allergy status to other drugs, medicaments and biological substances status: Secondary | ICD-10-CM | POA: Diagnosis not present

## 2021-07-07 DIAGNOSIS — Z794 Long term (current) use of insulin: Secondary | ICD-10-CM | POA: Diagnosis not present

## 2021-07-07 DIAGNOSIS — Z86718 Personal history of other venous thrombosis and embolism: Secondary | ICD-10-CM | POA: Diagnosis not present

## 2021-07-07 DIAGNOSIS — Z79899 Other long term (current) drug therapy: Secondary | ICD-10-CM | POA: Diagnosis not present

## 2021-07-07 DIAGNOSIS — E119 Type 2 diabetes mellitus without complications: Secondary | ICD-10-CM | POA: Diagnosis not present

## 2021-07-07 DIAGNOSIS — Z87891 Personal history of nicotine dependence: Secondary | ICD-10-CM | POA: Diagnosis not present

## 2021-07-07 DIAGNOSIS — I10 Essential (primary) hypertension: Secondary | ICD-10-CM | POA: Diagnosis not present

## 2021-07-07 DIAGNOSIS — Z8521 Personal history of malignant neoplasm of larynx: Secondary | ICD-10-CM | POA: Diagnosis not present

## 2021-07-13 ENCOUNTER — Other Ambulatory Visit: Payer: Self-pay

## 2021-07-13 ENCOUNTER — Encounter: Payer: HMO | Attending: Internal Medicine | Admitting: Internal Medicine

## 2021-07-13 DIAGNOSIS — L97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: Secondary | ICD-10-CM

## 2021-07-13 DIAGNOSIS — Z89512 Acquired absence of left leg below knee: Secondary | ICD-10-CM | POA: Insufficient documentation

## 2021-07-13 DIAGNOSIS — T8131XA Disruption of external operation (surgical) wound, not elsewhere classified, initial encounter: Secondary | ICD-10-CM | POA: Diagnosis not present

## 2021-07-13 DIAGNOSIS — B356 Tinea cruris: Secondary | ICD-10-CM | POA: Diagnosis not present

## 2021-07-13 DIAGNOSIS — E10622 Type 1 diabetes mellitus with other skin ulcer: Secondary | ICD-10-CM | POA: Diagnosis not present

## 2021-07-13 DIAGNOSIS — Z794 Long term (current) use of insulin: Secondary | ICD-10-CM | POA: Insufficient documentation

## 2021-07-13 DIAGNOSIS — L089 Local infection of the skin and subcutaneous tissue, unspecified: Secondary | ICD-10-CM | POA: Diagnosis not present

## 2021-07-13 NOTE — Progress Notes (Signed)
KINDRED, REIDINGER ([id number]) Visit Report for 07/13/2021 Arrival Information Details Patient Name: [name], [name]. Date of Service: 07/13/2021 2:30 PM Medical Record Number: [id number] Patient Account Number: [id number] Date of Birth/Sex: 10/30/1960 (61 y.o. M) Treating RN: Donnamarie Poag Primary Care Tramaine Sauls: Derinda Late Other Clinician: Referring Cornell Gaber: Derinda Late Treating Duchess Armendarez/Extender: Yaakov Guthrie in Treatment: 7 Visit Information History Since Last Visit Added or deleted any medications: No Patient Arrived: Wheel Chair Had a fall or experienced change in No Arrival Time: 14:41 activities of daily living that may affect Accompanied By: mother/caregiver risk of falls: Transfer Assistance: [employer] Patient Lift Hospitalized since last visit: No Patient Identification Verified: Yes Has Dressing in Place as Prescribed: Yes Secondary Verification Process Completed: Yes Pain Present Now: No Patient Requires Transmission-Based No Precautions: Patient Has Alerts: Yes Patient Alerts: Patient on Blood Thinner Eliquis Diabetic Electronic Signature(s) Signed: 07/13/2021 4:28:58 PM By: Donnamarie Poag Entered By: Donnamarie Poag on 07/13/2021 14:51:43 [name] ([id number]) -------------------------------------------------------------------------------- Encounter Discharge Information Details Patient Name: [name] Date of Service: 07/13/2021 2:30 PM Medical Record Number: [id number] Patient Account Number: [id number] Date of Birth/Sex: 1961/02/13 (61 y.o. M) Treating RN: Donnamarie Poag Primary Care Alphonso Gregson: Derinda Late Other Clinician: Referring Azarel Banner: Derinda Late Treating Treonna Klee/Extender: Yaakov Guthrie in Treatment: 7 Encounter Discharge Information Items Post Procedure Vitals Discharge Condition: Stable Temperature (F): 98.3 Ambulatory Status: Wheelchair Pulse (bpm): 75 Discharge Destination: Home Health Respiratory Rate  (breaths/min): 16 Telephoned: No Blood Pressure (mmHg): 133/81 Orders Sent: Yes Transportation: Private Auto Accompanied By: mother/caregiver Schedule Follow-up Appointment: Yes Clinical Summary of Care: Electronic Signature(s) Signed: 07/13/2021 4:28:58 PM By: Donnamarie Poag Entered By: Donnamarie Poag on 07/13/2021 15:28:25 [name] ([id number]) -------------------------------------------------------------------------------- Lower Extremity Assessment Details Patient Name: [name] Date of Service: 07/13/2021 2:30 PM Medical Record Number: [id number] Patient Account Number: [id number] Date of Birth/Sex: 22-Nov-1960 (61 y.o. M) Treating RN: Donnamarie Poag Primary Care Virgin Zellers: Derinda Late Other Clinician: Referring Etty Isaac: Derinda Late Treating Lafonda Patron/Extender: Yaakov Guthrie in Treatment: 7 Edema Assessment Assessed: Shirlyn Goltz: Yes] [Right: No] [Left: Edema] [Right: :] Notes L BKA Electronic Signature(s) Signed: 07/13/2021 4:28:58 PM By: Donnamarie Poag Entered By: Donnamarie Poag on 07/13/2021 15:01:47 [name] ([id number]) -------------------------------------------------------------------------------- Multi Wound Chart Details Patient Name: [name] Date of Service: 07/13/2021 2:30 PM Medical Record Number: [id number] Patient Account Number: [id number] Date of Birth/Sex: [birth year] (61 y.o. 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 Comorbid History: Coronary Artery Disease, N/A N/A Hypertension, Type I Diabetes Date Acquired: 04/12/2021 N/A N/A Weeks of Treatment: 7 N/A N/A Wound Status: Open N/A N/A Wound Recurrence: No N/A N/A Pending Amputation on Yes N/A N/A Presentation: Measurements L x W x D (cm) 4x5.5x1.5 N/A N/A Area (cm) : 17.279 N/A N/A Volume (cm) : 25.918 N/A N/A % Reduction in Area: 75.60% N/A N/A % Reduction in Volume: 87.40% N/A N/A Starting Position 1 (o'clock): 11 Ending Position 1 (o'clock): 1 Maximum Distance 1 (cm): 1.9 Undermining: Yes N/A N/A Classification: Full Thickness With Exposed N/A N/A Support Structures Exudate Amount: Large N/A N/A Exudate Type:  Serosanguineous N/A N/A Exudate Color: red, brown N/A N/A Granulation Amount: Large (67-100%) N/A N/A Granulation Quality: Red N/A N/A Necrotic Amount: Small (1-33%) N/A N/A Exposed Structures: Fat Layer (Subcutaneous Tissue): N/A N/A Yes Tendon: Yes Muscle: Yes Bone: Yes Fascia: No Joint: No Epithelialization: None N/A N/A Treatment Notes WORTH, KOBER ([id number]) Electronic Signature(s) Signed: 07/13/2021 4:28:58 PM By: Donnamarie Poag Entered By: Donnamarie Poag on 07/13/2021 15:20:13 [name] ([id number]) -------------------------------------------------------------------------------- [place] Details Patient Name: [name] Date of Service: 07/13/2021 2:30 PM Medical Record Number: [id number] Patient Account Number: [id number] Date of Birth/Sex: 1960/10/17 (61 y.o. M) Treating RN: Donnamarie Poag Primary Care Tyke Outman: Derinda Late Other Clinician: Referring Teyanna Thielman: Derinda Late Treating Saleen Peden/Extender: Yaakov Guthrie in Treatment: 7 Active Inactive Wound/Skin Impairment Nursing Diagnoses: Impaired tissue integrity Knowledge deficit related to smoking impact on wound healing Knowledge deficit related to ulceration/compromised skin  integrity Goals: Patient/caregiver will verbalize understanding of skin care regimen Date Initiated: 05/25/2021 Date Inactivated: 06/15/2021 Target Resolution Date: 07/04/2021 Goal Status: Met Ulcer/skin breakdown will have a volume reduction of 30% by week 4 Date Initiated: 05/25/2021 Date Inactivated: 06/29/2021 Target Resolution Date: 06/22/2021 Goal Status: Met Ulcer/skin breakdown will have a volume reduction of 50% by week 8 Date Initiated: 05/25/2021 Date Inactivated: 07/13/2021 Target Resolution Date: 07/20/2021 Goal Status: Met Ulcer/skin breakdown will have a volume reduction of 80% by week 12 Date Initiated: 05/25/2021 Target Resolution Date: 08/10/2021 Goal Status: Active Ulcer/skin breakdown will heal within 14 weeks Date Initiated: 05/25/2021 Target Resolution Date: 09/07/2021 Goal Status: Active Interventions: Assess patient/caregiver ability to obtain necessary supplies Assess patient/caregiver ability to perform ulcer/skin care regimen upon admission and as needed Assess ulceration(s) every visit Notes: Electronic Signature(s) Signed: 07/13/2021 4:28:58 PM By: Donnamarie Poag Entered By: Donnamarie Poag on 07/13/2021 15:02:12 [name] ([id number]) -------------------------------------------------------------------------------- Pain Assessment Details Patient Name: [name] Date of Service: 07/13/2021 2:30 PM Medical Record Number: [id number] Patient Account Number: [id number] Date of Birth/Sex: 09-Oct-1960 (61 y.o. M) Treating RN: Donnamarie Poag Primary Care Emmaclaire Switala: Derinda Late Other Clinician: Referring Lasheena Frieze: Derinda Late Treating Delno Blaisdell/Extender: Yaakov Guthrie in Treatment: 7 Active Problems Location of Pain Severity and Description of Pain Patient Has Paino No Site Locations Rate the pain. Current Pain Level: 0 Pain Management and Medication Current Pain Management: Electronic Signature(s) Signed: 07/13/2021 4:28:58 PM By: Donnamarie Poag Entered  By: Donnamarie Poag on 07/13/2021 14:54:20 [name] ([id number]) -------------------------------------------------------------------------------- Patient/Caregiver Education Details Patient Name: [name] Date of Service: 07/13/2021 2:30 PM Medical Record Number: [id number] Patient Account Number: [id number] Date of Birth/Gender: August 30, 1960 (61 y.o. M) Treating RN: Donnamarie Poag Primary Care Physician: Derinda Late Other Clinician: Referring Physician: Derinda Late Treating Physician/Extender: Yaakov Guthrie in Treatment: 7 Education Assessment Education Provided To: Patient and Caregiver Education Topics Provided Basic Hygiene: Elevated Blood Sugar/ Impact on Healing: Nutrition: Wound Debridement: Wound/Skin Impairment: Electronic Signature(s) Signed: 07/13/2021 4:28:58 PM By: Donnamarie Poag Entered By: Donnamarie Poag on 07/13/2021 15:26:33 [name] ([id number]) -------------------------------------------------------------------------------- Wound Assessment Details Patient Name: [name] Date of Service: 07/13/2021 2:30 PM Medical Record Number: [id number] Patient Account Number: [id number] Date of Birth/Sex: Jul 13, 1960 (61 y.o. 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 Etiology: Wounding Event: Surgical Injury Wound Status: Open Date  Acquired: 04/12/2021 Comorbid History: Coronary Artery Disease, Hypertension, Type I Weeks Of Treatment: 7 Diabetes Clustered Wound: No Pending Amputation On Presentation Photos Wound Measurements Length: (cm) 4 Width: (cm) 5.5 Depth: (cm) 1.5 Area: (cm) 17.279 Volume: (cm) 25.918 % Reduction in  Area: 75.6% % Reduction in Volume: 87.4% Epithelialization: None Tunneling: No Undermining: Yes Starting Position (o'clock): 11 Ending Position (o'clock): 1 Maximum Distance: (cm) 1.9 Wound Description Classification: Full Thickness With Exposed Support Structures Exudate Amount: Large Exudate Type: Serosanguineous Exudate Color: red, brown Foul Odor After Cleansing: No Slough/Fibrino Yes Wound Bed Granulation Amount: Large (67-100%) Exposed Structure Granulation Quality: Red Fascia Exposed: No Necrotic Amount: Small (1-33%) Fat Layer (Subcutaneous Tissue) Exposed: Yes Necrotic Quality: Adherent Slough Tendon Exposed: No Muscle Exposed: Yes Necrosis of Muscle: No Joint Exposed: No Bone Exposed: Yes Treatment Notes Wound #1 (Amputation Site - Below Knee) Wound Laterality: Left HENRY, [place] ([id number]) Cleanser Dakin 16 (oz) 0.25 Discharge Instruction: Use as directed. Normal Saline Discharge Instruction: Wash your hands with soap and water. Remove old dressing, discard into plastic bag and place into trash. Cleanse the wound with Normal Saline prior to applying a clean dressing using gauze sponges, not tissues or cotton balls. Do not scrub or use excessive force. Pat dry using gauze sponges, not tissue or cotton balls. Wound Cleanser Discharge Instruction: Wash your hands with soap and water. Remove old dressing, discard into plastic bag and place into trash. Cleanse the wound with Wound Cleanser prior to applying a clean dressing using gauze sponges, not tissues or cotton balls. Do not scrub or use excessive force. Pat dry using gauze sponges, not tissue or cotton balls. Peri-Wound Care Topical Primary Dressing Gauze Discharge Instruction: Dakins lightly soaked gauze lightly packed-As directed: dry, moistened with saline or moistened with Dakins Solution Secondary Dressing ABD Pad 5x9 (in/in) Discharge Instruction: Cover with ABD pad Secured With ACE WRAP - 24M  ACE Elastic Bandage With VELCRO Brand Closure, 4 (in) Discharge Instruction: PRN to keep dressing in place Medipore Tape - 24M [place] Surgical Tape, 2x2 (in/yd) Kerlix Roll Sterile or Non-Sterile 6-ply 4.5x4 (yd/yd) Discharge Instruction: Apply Kerlix as directed Compression Wrap Compression Stockings Add-Ons Electronic Signature(s) Signed: 07/13/2021 4:28:58 PM By: Donnamarie Poag Entered By: Donnamarie Poag on 07/13/2021 15:27:17 [name] ([id number]) -------------------------------------------------------------------------------- [place] Details Patient Name: [name] Date of Service: 07/13/2021 2:30 PM Medical Record Number: [id number] Patient Account Number: [id number] Date of Birth/Sex: 1960-08-28 (61 y.o. M) Treating RN: Donnamarie Poag Primary Care Malayshia All: Derinda Late Other Clinician: Referring Rahaf Carbonell: Derinda Late Treating Davidson Palmieri/Extender: Yaakov Guthrie in Treatment: 7 Vital Signs Time Taken: 14:50 Temperature (F): 98.3 Holder (in): 71 Pulse (bpm): 75 Weight (lbs): 250 Respiratory Rate (breaths/min): 16 Body Mass Index (BMI): 34.9 Blood Pressure (mmHg): 133/81 Reference Range: 80 - 120 mg / dl Electronic Signature(s) Signed: 07/13/2021 4:28:58 PM By: Donnamarie Poag Entered ByDonnamarie Poag on 07/13/2021 14:54:12

## 2021-07-13 NOTE — Progress Notes (Signed)
[name] ([id number]) Visit Report for 07/13/2021 Chief Complaint Document Details Patient Name: [name]. Date of Service: 07/13/2021 2:30 PM Medical Record Number: [id number] Patient Account Number: [id number] Date of Birth/Sex: 1960/06/16 (61 y.o. M) Treating RN: [name] Primary Care Provider: Derinda Late Other Clinician: Referring Provider: Derinda Late Treating Provider/Extender: [name] in Treatment: 7 Information Obtained from: Patient Chief Complaint Left lower extremity amputation site wound Electronic Signature(s) Signed: 07/13/2021 3:57:46 PM By: Kalman Shan DO Entered By: Kalman Shan on 07/13/2021 15:52:19 [name] ([id number]) -------------------------------------------------------------------------------- Debridement Details Patient Name: [name] Date of Service: 07/13/2021 2:30 PM Medical Record Number: [id number] Patient Account Number: [id number] Date of Birth/Sex: 09-Jul-1960 (61 y.o. M) Treating RN: [name] Primary Care Provider: Derinda Late Other Clinician: Referring Provider: Derinda Late Treating Provider/Extender: [name] in Treatment: 7 Debridement Performed for Wound #1 Left Amputation Site - Below Knee Assessment: Performed By: Physician Kalman Shan, MD Debridement Type: Debridement Severity of Tissue Pre Debridement: Fat layer exposed Level of Consciousness (Pre- Awake and Alert procedure): Pre-procedure Verification/Time Out Yes - 15:18 Taken: Start Time: 15:19 Pain Control: Lidocaine Total Area Debrided (L x W): 4 (cm) x 5.5 (cm) = 22 (cm) Tissue and other material Viable, Non-Viable, Slough, Subcutaneous, Slough debrided: Level: Skin/Subcutaneous Tissue Debridement Description: Excisional Instrument: Curette Bleeding: Moderate Hemostasis Achieved: Pressure Response to Treatment: Procedure was tolerated well Level of Consciousness (Post- Awake and  Alert procedure): Post Debridement Measurements of Total Wound Length: (cm) 4 Width: (cm) 5.5 Depth: (cm) 1.5 Volume: (cm) 25.918 Character of Wound/Ulcer Post Debridement: Improved Severity of Tissue Post Debridement: Fat layer exposed Post Procedure Diagnosis Same as Pre-procedure Electronic Signature(s) Signed: 07/13/2021 3:57:46 PM By: Kalman Shan DO Signed: 07/13/2021 4:28:58 PM By: [name] Entered By: [name] on 07/13/2021 15:21:35 [name] ([id number]) -------------------------------------------------------------------------------- HPI Details Patient Name: [name] Date of Service: 07/13/2021 2:30 PM Medical Record Number: [id number] Patient Account Number: [id number] Date of Birth/Sex: [birth year] (61 y.o. M) Treating RN: [name] Primary Care Provider: Derinda Late Other Clinician: Referring Provider: Derinda Late Treating Provider/Extender: [name] in Treatment: 7 History of Present Illness HPI Description: Admission 05/25/2021 [name] is a 61 year old male with a past medical history of uncontrolled type 1 diabetes on insulin with last hemoglobin A1c of 8.8, peripheral arterial disease, status post left BKA that presents to the clinic for a left BKA amputation site wound. Patient developed a diabetic foot infection on 04/01/2021 and ultimately underwent a left BKA on 04/12/2021. The wound site became infected and he underwent an IandD on 05/07/2021. The wound was left open at that time. He has been using wet-to-dry dressings for the past 3 weeks. He currently denies Systemic signs of infection. 1/25; patient presents for follow-up. He has been using Dakin's wet-to-dry dressings with improvement in wound healing. He has no issues or complaints today. He denies signs of infection. 2/8; Patient presents for follow-up. He continues To use Dakin's wet-to-dry dressings. He has no issues or complaints today. He denies signs  of infection. He follows up with his surgeon next week. 2/22; patient presents for follow-up. He continues to use Dakin's wet-to-dry dressings. He denies signs of infection. He saw his vascular surgeon on 06/24/2021. They took out the staples. Nothing further to report on. 3/8; patient presents for follow-up. He has been using Dakin's wet-to-dry dressings without issues. He denies signs of infection. Electronic Signature(s) Signed: 07/13/2021 3:57:46 PM By: Kalman Shan DO Entered By:  Kalman Shan on 07/13/2021 15:52:40 [name] ([id number]) -------------------------------------------------------------------------------- Physical Exam Details Patient Name: [name]. Date of Service: 07/13/2021 2:30 PM Medical Record Number: [id number] Patient Account Number: [id number] Date of Birth/Sex: [birth year] (61 y.o. M) Treating RN: [name] Primary Care Provider: Derinda Late Other Clinician: Referring Provider: Derinda Late Treating Provider/Extender: [name] in Treatment: 7 Constitutional . Psychiatric . Notes Left BKA with wound at the amputation site with mostly granulation tissue and some nonviable tissue. Postdebridement only granulation tissue present. No probing to bone. No signs of surrounding infection. Electronic Signature(s) Signed: 07/13/2021 3:57:46 PM By: Kalman Shan DO Entered By: Kalman Shan on 07/13/2021 15:53:32 [name] ([id number]) -------------------------------------------------------------------------------- Physician Orders Details Patient Name: [name] Date of Service: 07/13/2021 2:30 PM Medical Record Number: [id number] Patient Account Number: [id number] Date of Birth/Sex: 1960/08/03 (61 y.o. M) Treating RN: [name] Primary Care Provider: Derinda Late Other Clinician: Referring Provider: Derinda Late Treating Provider/Extender: [name] in Treatment: 7 Verbal / Phone Orders:  No Diagnosis Coding Follow-up Appointments o Return Appointment in 2 weeks. [facility] for wound care. May utilize formulary equivalent dressing for wound treatment orders unless otherwise specified. Home Health Nurse may visit [facility] to address patientos wound care needs. - [facility] WOUND VAC ORDER SENT TODAY AT WOUND CARE APPT-PLEASE INITIATE WITH ENHABIT AND FOLLOW ORDERS WHEN IT ARRIVES-CONTACT [facility] IF ANY PROBLEMS OR QUESTIONS OR CONCERNS OF USE o Scheduled days for dressing changes to be completed; exception, patient has scheduled wound care visit that day. - Next appt at wound care 07/27/21 o **Please direct any NON-WOUND related issues/requests for orders to patient's Primary Care Physician. **If current dressing causes regression in wound condition, may D/C ordered dressing product/s and apply Normal Saline Moist Dressing daily until next [place] or Other MD appointment. **Notify Wound Healing Center of regression in wound condition at [phone]. Bathing/ Shower/ Hygiene o No tub bath. Anesthetic (Use 'Patient Medications' Section for Anesthetic Order Entry) o Lidocaine applied to wound bed Edema Control - Lymphedema / Segmental Compressive Device / Other o DO YOUR BEST to sleep in the bed at night. DO NOT sleep in your recliner. Long hours of sitting in a recliner leads to swelling of the legs and/or potential wounds on your backside. Additional Orders / Instructions o Follow Nutritious Diet and Increase Protein Intake - monitor blood sugar to promote wound healing o Other: - Keep appointment with surgeon as they had scheduled Negative Pressure Wound Therapy o Wound VAC settings at 182mHg continuous pressure. Use foam to wound cavity. Please order WHITE foam to fill any tunnel/s and/or undermining when necessary. Change VAC dressing 3 X WEEK. Change canister as indicated when full. o Home  Health Nurse may d/c VAC for s/s of increased infection, significant wound regression, or uncontrolled drainage. NAuroraat [phone] o Number of foam/gauze pieces used in the dressing = o Other: Wound Treatment Wound #1 - Amputation Site - Below Knee Wound Laterality: Left Cleanser: Dakin 16 (oz) 0.25 2 x Per Day/15 Days Discharge Instructions: Use as directed. Cleanser: Normal Saline 2 x Per DIDP/82Days Discharge Instructions: Wash your hands with soap and water. Remove old dressing, discard into plastic bag and place into trash. Cleanse the wound with Normal Saline prior to applying a clean dressing using gauze sponges, not tissues or cotton balls. Do not scrub or use excessive force. Pat dry using gauze sponges,  not tissue or cotton balls. Cleanser: Wound Cleanser 2 x Per Day/15 Days Discharge Instructions: Wash your hands with soap and water. Remove old dressing, discard into plastic bag and place into trash. Cleanse the wound with Wound Cleanser prior to applying a clean dressing using gauze sponges, not tissues or cotton balls. Do not scrub or use excessive force. Pat dry using gauze sponges, not tissue or cotton balls. Primary Dressing: Gauze 2 x Per QMV/78 Days Discharge Instructions: Dakins lightly soaked gauze lightly packed-As directed: dry, moistened with saline or moistened with Dakins Solution Secondary Dressing: ABD Pad 5x9 (in/in) 2 x Per Day/15 Days ALECZANDER, FANDINO ([id number]) Discharge Instructions: Cover with ABD pad Secured With: ACE WRAP - 52M ACE Elastic Bandage With VELCRO Brand Closure, 4 (in) 2 x Per Day/15 Days Discharge Instructions: PRN to keep dressing in place Secured With: Peconic Surgical Tape, 2x2 (in/yd) 2 x Per Day/15 Days Secured With: Kerlix Roll Sterile or Non-Sterile 6-ply 4.5x4 (yd/yd) 2 x Per Day/15 Days Discharge Instructions: Apply Kerlix as directed Electronic Signature(s) Signed:  07/13/2021 3:57:46 PM By: Kalman Shan DO Entered By: Kalman Shan on 07/13/2021 15:56:45 [name] ([id number]) -------------------------------------------------------------------------------- Problem List Details Patient Name: [name] Date of Service: 07/13/2021 2:30 PM Medical Record Number: [id number] Patient Account Number: [id number] Date of Birth/Sex: [birth year] (61 y.o. 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 M) Treating RN: [name] Primary Care Provider: Derinda Late Other Clinician: Referring Provider: Derinda Late Treating Provider/Extender: [name] in Treatment: 7 Subjective Chief Complaint Information obtained from Patient Left lower extremity amputation site wound History of Present Illness (HPI) Admission 05/25/2021 Mr. Nello Corro is a 61 year old male  with a past medical history of uncontrolled type 1 diabetes on insulin with last hemoglobin A1c of 8.8, peripheral arterial disease, status post left BKA that presents to the clinic for a left BKA amputation site wound. Patient developed a diabetic foot infection on 04/01/2021 and ultimately underwent a left BKA on 04/12/2021. The wound site became infected and he underwent an IandD on 05/07/2021. The wound was left open at that time. He has been using wet-to-dry dressings for the past 3 weeks. He currently denies Systemic signs of infection. 1/25; patient presents for follow-up. He has been using Dakin's wet-to-dry dressings with improvement in wound healing. He has no issues or complaints today. He denies signs of infection. 2/8; Patient presents for follow-up. He continues To use Dakin's wet-to-dry dressings. He has no issues or complaints today. He denies signs of infection. He follows up with his surgeon next week. 2/22; patient presents for follow-up. He continues to use Dakin's wet-to-dry dressings. He  denies signs of infection. He saw his vascular surgeon on 06/24/2021. They took out the staples. Nothing further to report on. 3/8; patient presents for follow-up. He has been using Dakin's wet-to-dry dressings without issues. He denies signs of infection. Objective Constitutional Vitals Time Taken: 2:50 PM, Holder: 71 in, Weight: 250 lbs, BMI: 34.9, Temperature: 98.3 F, Pulse: 75 bpm, Respiratory Rate: 16 breaths/min, Blood Pressure: 133/81 mmHg. General Notes: Left BKA with wound at the amputation site with mostly granulation tissue and some nonviable tissue. Postdebridement only granulation tissue present. No probing to bone. No signs of surrounding infection. Integumentary (Hair, Skin) Wound #1 status is Open. Original cause of wound was Surgical Injury. The date acquired was: 04/12/2021. The wound has been in treatment 7 weeks. The wound is located on the Left Amputation Site - Below  Knee. The wound measures 4cm length x 5.5cm width x 1.5cm depth; 17.279cm^2 area and 25.918cm^3 volume. There is bone, muscle, and Fat Layer (Subcutaneous Tissue) exposed. There is no tunneling noted, however, there is undermining starting at 11:00 and ending at 1:00 with a maximum distance of 1.9cm. There is a large amount of serosanguineous drainage noted. There is large (67-100%) red granulation within the wound bed. There is a small (1-33%) amount of necrotic tissue within the wound bed including Adherent Slough. 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 ([id number]) Patient's wound has shown improvement in size and appearance since last clinic visit. No signs of surrounding infection. I debrided nonviable tissue. I recommended starting a wound VAC. For now he can use Dakin's wet-to-dry dressings. Follow-up in 2 weeks. He has home health to help initiate the wound VAC. Procedures Wound #1 Pre-procedure diagnosis of Wound #1 is an Open Surgical Wound located on the Left Amputation Site - Below Knee .Severity of Tissue Pre Debridement is: Fat layer exposed. There was a Excisional Skin/Subcutaneous Tissue Debridement with a total area of 22 sq cm performed by Kalman Shan, MD. With the following instrument(s): Curette to remove Viable and Non-Viable tissue/material. Material removed includes Subcutaneous Tissue and Slough and after achieving pain control using Lidocaine. A time out was conducted at 15:18, prior to the start of the procedure. A Moderate amount of bleeding was controlled with Pressure. The procedure was tolerated well. Post Debridement Measurements: 4cm length x 5.5cm width x  1.5cm depth; 25.918cm^3 volume. Character of Wound/Ulcer Post Debridement is improved. Severity of Tissue Post Debridement is: Fat layer exposed. Post procedure Diagnosis Wound #1: Same as Pre-Procedure Plan Follow-up Appointments: Return Appointment in 2 weeks. Home Health: [facility]: - [facility] for wound care. May utilize formulary equivalent dressing for wound treatment orders unless otherwise specified. Home Health Nurse may visit [facility] to address patient s wound care needs. - [facility] WOUND VAC ORDER SENT TODAY AT WOUND CARE APPT-PLEASE INITIATE WITH [facility]IT AND FOLLOW ORDERS WHEN IT ARRIVES-CONTACT [facility] IF ANY PROBLEMS OR QUESTIONS OR CONCERNS OF USE Scheduled days for dressing changes to be completed; exception, patient has scheduled wound care visit that day. - Next appt at wound care 07/27/21 **Please direct any NON-WOUND related issues/requests for orders to patient's Primary Care Physician. **If current dressing causes regression in wound condition, may D/C ordered dressing product/s and apply Normal Saline Moist Dressing  daily until next [facility] or Other MD appointment. **Notify Wound Healing Center of regression in wound condition at [phone]. Bathing/ Shower/ Hygiene: No tub bath. Anesthetic (Use 'Patient Medications' Section for Anesthetic Order Entry): Lidocaine applied to wound bed Edema Control - Lymphedema / Segmental Compressive Device / Other: DO YOUR BEST to sleep in the bed at night. DO NOT sleep in your recliner. Long hours of sitting in a recliner leads to swelling of the legs and/or potential wounds on your backside. Additional Orders / Instructions: Follow Nutritious Diet and Increase Protein Intake - monitor blood sugar to promote wound healing Other: - Keep appointment with surgeon as they had scheduled Negative Pressure Wound Therapy: Wound VAC settings at 194mHg continuous pressure. Use foam to wound cavity. Please order  WHITE foam to fill any tunnel/s and/or undermining when necessary. Change VAC dressing 3 X WEEK. Change canister as indicated when full. Home Health Nurse may d/c VAC for s/s of increased infection, significant wound regression, or uncontrolled drainage. NLickingat [phone] Number of foam/gauze pieces used in the dressing = Other: WOUND #1: - Amputation Site - Below Knee Wound Laterality: Left Cleanser: Dakin 16 (oz) 0.25 2 x Per Day/15 Days Discharge Instructions: Use as directed. Cleanser: Normal Saline 2 x Per DBOF/75Days Discharge Instructions: Wash your hands with soap and water. Remove old dressing, discard into plastic bag and place into trash. Cleanse the wound with Normal Saline prior to applying a clean dressing using gauze sponges, not tissues or cotton balls. Do not scrub or use excessive force. Pat dry using gauze sponges, not tissue or cotton balls. Cleanser: Wound Cleanser 2 x Per Day/15 Days Discharge Instructions: Wash your hands with soap and water. Remove old dressing, discard into plastic bag and place into trash. Cleanse the wound with Wound Cleanser prior to applying a clean dressing using gauze sponges, not tissues or cotton balls. Do not scrub or use excessive force. Pat dry using gauze sponges, not tissue or cotton balls. Primary Dressing: Gauze 2 x Per Day/15 Days Discharge Instructions: Dakins lightly soaked gauze lightly packed-As directed: dry, moistened with saline or moistened with Dakins Solution Secondary Dressing: ABD Pad 5x9 (in/in) 2 x Per Day/15 Days Discharge Instructions: Cover with ABD pad Secured With: ACE WRAP - 28M ACE Elastic Bandage With VELCRO Brand Closure, 4 (in) 2 x Per Day/15 Days Discharge Instructions: PRN to keep dressing in place BCORIAN, HANDLEY([id number] Secured With: Medipore Tape - 28M Medipore H Soft Cloth Surgical Tape, 2x2 (in/yd) 2 x Per Day/15 Days Secured With: Kerlix Roll Sterile or Non-Sterile 6-ply  4.5x4 (yd/yd) 2 x Per Day/15 Days Discharge Instructions: Apply Kerlix as directed 1. In office sharp debridement 2. Order wound VAC can continue Dakin's wet-to-dry dressing until this is initiated by home health Electronic Signature(s) Signed: 07/13/2021 3:57:46 PM By: HKalman ShanDO Entered By: HKalman Shanon 07/13/2021 15:56:07 BKaralee Holder([id number] -------------------------------------------------------------------------------- SuperBill Details Patient Name: BKaralee HeightDate of Service: 07/13/2021 Medical Record Number: [id number] Account Number: [id number] of Birth/Sex: [birth year](61y.o. M) Treating RN: BDonnamarie PoagPrimary Care Provider: BDerinda LateOther Clinician: Referring Provider: BDerinda LateTreating Provider/Extender: HYaakov Guthriein Treatment: 7 Diagnosis Coding ICD-10 Codes Code Description [phone] absence of left leg below knee T81.31XA Disruption of external operation (surgical) wound, not elsewhere classified, initial encounter E10.622 Type 1 diabetes mellitus with other skin ulcer L97.826 Non-pressure chronic ulcer of other part of left lower leg with  bone involvement without evidence of necrosis Facility Procedures CPT4: Description Modifier Quantity Code [id number] 11042 - DEB SUBQ TISSUE 20 SQ CM/< 1 CPT4: ICD-10 Diagnosis Description L97.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 CPT4: [id number] 11045 - DEB SUBQ TISS EA ADDL 20CM 1 CPT4: ICD-10 Diagnosis Description L97.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

## 2021-07-14 DIAGNOSIS — E1069 Type 1 diabetes mellitus with other specified complication: Secondary | ICD-10-CM | POA: Diagnosis not present

## 2021-07-14 DIAGNOSIS — E1159 Type 2 diabetes mellitus with other circulatory complications: Secondary | ICD-10-CM | POA: Diagnosis not present

## 2021-07-14 DIAGNOSIS — E1029 Type 1 diabetes mellitus with other diabetic kidney complication: Secondary | ICD-10-CM | POA: Diagnosis not present

## 2021-07-14 DIAGNOSIS — E785 Hyperlipidemia, unspecified: Secondary | ICD-10-CM | POA: Diagnosis not present

## 2021-07-14 DIAGNOSIS — R809 Proteinuria, unspecified: Secondary | ICD-10-CM | POA: Diagnosis not present

## 2021-07-14 DIAGNOSIS — I152 Hypertension secondary to endocrine disorders: Secondary | ICD-10-CM | POA: Diagnosis not present

## 2021-07-18 DIAGNOSIS — Z930 Tracheostomy status: Secondary | ICD-10-CM | POA: Diagnosis not present

## 2021-07-18 DIAGNOSIS — I639 Cerebral infarction, unspecified: Secondary | ICD-10-CM | POA: Diagnosis not present

## 2021-07-18 DIAGNOSIS — T8189XA Other complications of procedures, not elsewhere classified, initial encounter: Secondary | ICD-10-CM | POA: Diagnosis not present

## 2021-07-18 DIAGNOSIS — R220 Localized swelling, mass and lump, head: Secondary | ICD-10-CM | POA: Diagnosis not present

## 2021-07-18 DIAGNOSIS — J9620 Acute and chronic respiratory failure, unspecified whether with hypoxia or hypercapnia: Secondary | ICD-10-CM | POA: Diagnosis not present

## 2021-07-20 ENCOUNTER — Encounter: Payer: HMO | Admitting: Internal Medicine

## 2021-07-26 ENCOUNTER — Other Ambulatory Visit: Payer: Self-pay | Admitting: Psychiatry

## 2021-07-26 DIAGNOSIS — F32A Depression, unspecified: Secondary | ICD-10-CM | POA: Diagnosis not present

## 2021-07-26 DIAGNOSIS — E7800 Pure hypercholesterolemia, unspecified: Secondary | ICD-10-CM | POA: Diagnosis not present

## 2021-07-26 DIAGNOSIS — Z448 Encounter for fitting and adjustment of other external prosthetic devices: Secondary | ICD-10-CM | POA: Diagnosis not present

## 2021-07-26 DIAGNOSIS — Z9002 Acquired absence of larynx: Secondary | ICD-10-CM | POA: Diagnosis not present

## 2021-07-26 DIAGNOSIS — Z79899 Other long term (current) drug therapy: Secondary | ICD-10-CM | POA: Diagnosis not present

## 2021-07-26 DIAGNOSIS — E039 Hypothyroidism, unspecified: Secondary | ICD-10-CM | POA: Diagnosis not present

## 2021-07-26 DIAGNOSIS — Z888 Allergy status to other drugs, medicaments and biological substances status: Secondary | ICD-10-CM | POA: Diagnosis not present

## 2021-07-26 DIAGNOSIS — I10 Essential (primary) hypertension: Secondary | ICD-10-CM | POA: Diagnosis not present

## 2021-07-26 DIAGNOSIS — Z86718 Personal history of other venous thrombosis and embolism: Secondary | ICD-10-CM | POA: Diagnosis not present

## 2021-07-26 DIAGNOSIS — Z87891 Personal history of nicotine dependence: Secondary | ICD-10-CM | POA: Diagnosis not present

## 2021-07-26 DIAGNOSIS — Z794 Long term (current) use of insulin: Secondary | ICD-10-CM | POA: Diagnosis not present

## 2021-07-26 DIAGNOSIS — Z79890 Hormone replacement therapy (postmenopausal): Secondary | ICD-10-CM | POA: Diagnosis not present

## 2021-07-26 DIAGNOSIS — F5101 Primary insomnia: Secondary | ICD-10-CM

## 2021-07-26 DIAGNOSIS — Z7901 Long term (current) use of anticoagulants: Secondary | ICD-10-CM | POA: Diagnosis not present

## 2021-07-26 DIAGNOSIS — E119 Type 2 diabetes mellitus without complications: Secondary | ICD-10-CM | POA: Diagnosis not present

## 2021-07-27 ENCOUNTER — Other Ambulatory Visit: Payer: Self-pay

## 2021-07-27 ENCOUNTER — Encounter (HOSPITAL_BASED_OUTPATIENT_CLINIC_OR_DEPARTMENT_OTHER): Payer: HMO | Admitting: Internal Medicine

## 2021-07-27 DIAGNOSIS — E10622 Type 1 diabetes mellitus with other skin ulcer: Secondary | ICD-10-CM

## 2021-07-27 DIAGNOSIS — L97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: Secondary | ICD-10-CM

## 2021-07-27 DIAGNOSIS — T8131XA Disruption of external operation (surgical) wound, not elsewhere classified, initial encounter: Secondary | ICD-10-CM | POA: Diagnosis not present

## 2021-07-27 NOTE — Progress Notes (Signed)
KHRIZ, [name] ([id number]) ?Visit Report for 07/27/2021 ?Chief Complaint Document Details ?Patient Name: [name], [name] ?Date of Service: 07/27/2021 3:15 PM ?Medical Record Number: [id number] ?Patient Account Number: [id number] ?Date of Birth/Sex: 12/08/1960 (61 y.o. M) ?Treating RN: Donnamarie Poag ?Primary Care Provider: Derinda Late Other Clinician: ?Referring Provider: Derinda Late ?Treating Provider/Extender: Kalman Shan ?Weeks in Treatment: 9 ?Information Obtained from: Patient ?Chief Complaint ?Left lower extremity amputation site wound ?Electronic Signature(s) ?Signed: 07/27/2021 3:55:42 PM By: Kalman Shan DO ?Entered By: Kalman Shan on 07/27/2021 15:51:54 ?[name], [name] ([id number]) ?-------------------------------------------------------------------------------- ?Debridement Details ?Patient Name: [name], [name] ?Date of Service: 07/27/2021 3:15 PM ?Medical Record Number: [id number] ?Patient Account Number: [id number] ?Date of Birth/Sex: 06/21/1960 (61 y.o. M) ?Treating RN: Donnamarie Poag ?Primary Care Provider: Derinda Late Other Clinician: ?Referring Provider: Derinda Late ?Treating Provider/Extender: Kalman Shan ?Weeks in Treatment: 9 ?Debridement Performed for ?Wound #1 Left Amputation Site - Below Knee ?Assessment: ?Performed By: Physician Kalman Shan, MD ?Debridement Type: Debridement ?Severity of Tissue Pre Debridement: Fat layer exposed ?Level of Consciousness (Pre- ?Awake and Alert ?procedure): ?Pre-procedure Verification/Time Out ?Yes - 15:39 ?Taken: ?Start Time: 15:40 ?Pain Control: Lidocaine ?Total Area Debrided (L x W): 1.5 (cm) x 3 (cm) = 4.5 (cm?) ?Tissue and other material ?Viable, Non-Viable, Slough, Subcutaneous, [place] ?debrided: ?Level: Skin/Subcutaneous Tissue ?Debridement Description: Excisional ?Instrument: Curette ?Bleeding: Minimum ?Hemostasis Achieved: Pressure ?Response to Treatment: Procedure was tolerated well ?Level of Consciousness (Post- ?Awake and  Alert ?procedure): ?Post Debridement Measurements of Total Wound ?Length: (cm) 3 ?Width: (cm) 5.8 ?Depth: (cm) 0.8 ?Volume: (cm?) 10.933 ?Character of Wound/Ulcer Post Debridement: Improved ?Severity of Tissue Post Debridement: Fat layer exposed ?Post Procedure Diagnosis ?Same as Pre-procedure ?Electronic Signature(s) ?Signed: 07/27/2021 3:55:42 PM By: Kalman Shan DO ?Signed: 07/27/2021 4:03:21 PM By: Donnamarie Poag ?Entered ByDonnamarie Poag on 07/27/2021 15:44:50 ?[name], [name] ([id number]) ?-------------------------------------------------------------------------------- ?HPI Details ?Patient Name: [name], [name] ?Date of Service: 07/27/2021 3:15 PM ?Medical Record Number: [id number] ?Patient Account Number: [id number] ?Date of Birth/Sex: 06/29/1960 (61 y.o. M) ?Treating RN: Donnamarie Poag ?Primary Care Provider: Derinda Late Other Clinician: ?Referring Provider: Derinda Late ?Treating Provider/Extender: Kalman Shan ?Weeks in Treatment: 9 ?History of Present Illness ?HPI Description: Admission 05/25/2021 ?[name] is a 61 year old male with a past medical history of uncontrolled type 1 diabetes on insulin with last hemoglobin A1c of 8.8, ?peripheral arterial disease, status post left BKA that presents to the clinic for a left BKA amputation site wound. 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 The wound site became infected and he underwent an IandD on ?05/07/2021. The wound was left open at that time. He has been using wet-to-dry dressings for the past 3 weeks. He currently denies Systemic ?signs of infection. ?1/25; patient presents for follow-up. He has been using Dakin's wet-to-dry dressings with improvement in wound healing. He has no issues or ?complaints today. He denies signs of infection. ?2/8; Patient presents for follow-up. He continues To use Dakin's wet-to-dry dressings. He has no issues or complaints today. He denies signs  of ?infection. He follows up with his surgeon next week. ?2/22; patient presents for follow-up. He continues to use Dakin's wet-to-dry dressings. He denies signs of infection. He saw his vascular surgeon ?on 06/24/2021. They took out the staples. Nothing further to report on. ?3/8; patient presents for follow-up. He has been using Dakin's wet-to-dry dressings without issues. He denies signs of infection. ?3/22; patient presents for follow-up. She has been using Medihoney without any  issues. She states that in the past 2 days she has had more ?tenderness to the wound bed. She denies signs of infection. ?Electronic Signature(s) ?Signed: 07/27/2021 3:55:42 PM By: Kalman Shan DO ?Entered By: Kalman Shan on 07/27/2021 15:52:18 ?[name], [name] ([id number]) ?-------------------------------------------------------------------------------- ?Physical Exam Details ?Patient Name: [name], [name] ?Date of Service: 07/27/2021 3:15 PM ?Medical Record Number: [id number] ?Patient Account Number: [id number] ?Date of Birth/Sex: 01-02-1961 (61 y.o. M) ?Treating RN: Donnamarie Poag ?Primary Care Provider: Derinda Late Other Clinician: ?Referring Provider: Derinda Late ?Treating Provider/Extender: Kalman Shan ?Weeks in Treatment: 9 ?Constitutional ?. ?Psychiatric ?Marland Kitchen ?Notes ?Left BKA with wound at the amputation site with mostly granulation tissue and some slough. There is some undermining with this does not probe ?to bone. No signs of surrounding infection. Overall appears well-healing. ?Electronic Signature(s) ?Signed: 07/27/2021 3:55:42 PM By: Kalman Shan DO ?Entered By: Kalman Shan on 07/27/2021 15:53:08 ?[name], [name] ([id number]) ?-------------------------------------------------------------------------------- ?Physician Orders Details ?Patient Name: [name], [name] ?Date of Service: 07/27/2021 3:15 PM ?Medical Record Number: [id number] ?Patient Account Number: [id number] ?Date of Birth/Sex: 06/27/1960 (61 y.o.  M) ?Treating RN: Donnamarie Poag ?Primary Care Provider: Derinda Late Other Clinician: ?Referring Provider: Derinda Late ?Treating Provider/Extender: Kalman Shan ?Weeks in Treatment: 9 ?Verbal / Phone Orders: No ?Diagnosis Coding ?Follow-up Appointments ?o Return Appointment in 2 weeks. ?Home Health ?[place] ?o [place] for wound care. May utilize formulary equivalent dressing for wound treatment orders unless ?otherwise specified. Home Health Nurse may visit [facility] to address patientos wound care needs. - CONTACT [facility] IF ANY ?PROBLEMS OR QUESTIONS OR CONCERNS OF USE ?o Scheduled days for dressing changes to be completed; exception, patient has scheduled wound care visit that day. ?o **Please direct any NON-WOUND related issues/requests for orders to patient's Primary Care Physician. **If current ?dressing causes regression in wound condition, may D/C ordered dressing product/s and apply Normal Saline Moist ?Dressing daily until next [place] or Other MD appointment. **Notify [facility] of regression in ?wound condition at [phone]. ?Bathing/ Shower/ Hygiene ?o No tub bath. ?Anesthetic (Use 'Patient Medications' Section for Anesthetic Order Entry) ?o Lidocaine applied to wound bed ?Edema Control - Lymphedema / Segmental Compressive Device / Other ?o DO YOUR BEST to sleep in the bed at night. DO NOT sleep in your recliner. Long hours of sitting in a recliner leads to ?swelling of the legs and/or potential wounds on your backside. ?Additional Orders / Instructions ?o Follow Nutritious Diet and Increase Protein Intake - monitor blood sugar to promote wound healing ?o Other: - Keep appointment with surgeon as they had scheduled ?Negative Pressure Wound Therapy ?o Wound VAC settings at 110mHg continuous pressure. Use foam to wound cavity. Please order WHITE foam to fill any ?tunnel/s and/or undermining when necessary. Change VAC  dressing 3 X WEEK. Change canister as indicated when full. ?o Home Health Nurse may d/c VAC for s/s of increased infection, significant wound regression, or uncontrolled drainage. ?[place]

## 2021-07-27 NOTE — Progress Notes (Signed)
NICKI, GRACY ([id number]) ?Visit Report for 07/27/2021 ?Arrival Information Details ?Patient Name: DONALDSON, RICHTER ?Date of Service: 07/27/2021 3:15 PM ?Medical Record Number: [id number] ?Patient Account Number: [id number] ?Date of Birth/Sex: 16-Jul-1960 (61 y.o. M) ?Treating RN: Donnamarie Poag ?Primary Care Elnore Cosens: Derinda Late Other Clinician: ?Referring Carder Yin: Derinda Late ?Treating Zaydn Gutridge/Extender: Kalman Shan ?Weeks in Treatment: 9 ?Visit Information History Since Last Visit ?Added or deleted any medications: No ?Patient Arrived: Wheel Chair ?Had a fall or experienced change in No ?Arrival Time: 15:11 ?activities of daily living that may affect ?Accompanied By: mother ?risk of falls: ?Transfer Assistance: Easy[employer] Patient Lift ?Hospitalized since last visit: No ?Patient Identification Verified: Yes ?Has Dressing in Place as Prescribed: Yes ?Secondary Verification Process Completed: Yes ?Pain Present Now: No ?Patient Requires Transmission-Based No ?Precautions: ?Patient Has Alerts: Yes ?Patient Alerts: Patient on Blood ?Thinner ?Eliquis ?Diabetic ?Electronic Signature(s) ?Signed: 07/27/2021 4:03:21 PM By: Donnamarie Poag ?Entered ByDonnamarie Poag on 07/27/2021 15:19:31 ?NAOL, ONTIVEROS ([id number]) ?-------------------------------------------------------------------------------- ?Encounter Discharge Information Details ?Patient Name: SUDEEP, SCHEIBEL ?Date of Service: 07/27/2021 3:15 PM ?Medical Record Number: [id number] ?Patient Account Number: [id number] ?Date of Birth/Sex: [birth year] (61 y.o. M) ?Treating RN: Donnamarie Poag ?Primary Care Valton Schwartz: Derinda Late Other Clinician: ?Referring Torie Towle: Derinda Late ?Treating Shilee Biggs/Extender: Kalman Shan ?Weeks in Treatment: 9 ?Encounter Discharge Information Items Post Procedure Vitals ?Discharge Condition: Stable ?Temperature (?F): 98.3 ?Ambulatory Status: Wheelchair ?Pulse (bpm): 73 ?Discharge Destination: Home Health ?Respiratory Rate (breaths/min):  16 ?Telephoned: No ?Blood Pressure (mmHg): 150/76 ?Orders Sent: Yes ?Transportation: Private Auto ?Accompanied By: mother ?Schedule Follow-up Appointment: Yes ?Clinical Summary of Care: ?Electronic Signature(s) ?Signed: 07/27/2021 4:03:21 PM By: Donnamarie Poag ?Entered ByDonnamarie Poag on 07/27/2021 15:55:30 ?KALAN, YELEY ([id number]) ?-------------------------------------------------------------------------------- ?Lower Extremity Assessment Details ?Patient Name: JAEVEN, WANZER ?Date of Service: 07/27/2021 3:15 PM ?Medical Record Number: [id number] ?Patient Account Number: [id number] ?Date of Birth/Sex: 12-24-1960 (61 y.o. M) ?Treating RN: Donnamarie Poag ?Primary Care Mikell Kazlauskas: Derinda Late Other Clinician: ?Referring Olia Hinderliter: Derinda Late ?Treating Gertude Benito/Extender: Kalman Shan ?Weeks in Treatment: 9 ?Edema Assessment ?Assessed: [Left: Yes] [Right: No] ?Edema: [Left: N] [Right: o] ?Notes ?L BKA ?Electronic Signature(s) ?Signed: 07/27/2021 4:03:21 PM By: Donnamarie Poag ?Entered ByDonnamarie Poag on 07/27/2021 15:23:11 ?DAYMON, HORA ([id number]) ?-------------------------------------------------------------------------------- ?Multi Wound Chart Details ?Patient Name: LEONTE, HORRIGAN ?Date of Service: 07/27/2021 3:15 PM ?Medical Record Number: [id number] ?Patient Account Number: [id number] ?Date of Birth/Sex: 01-02-1961 (61 y.o. M) ?Treating RN: Donnamarie Poag ?Primary Care Tyriq Moragne: Derinda Late Other Clinician: ?Referring Gerre Ranum: Derinda Late ?Treating Eldredge Veldhuizen/Extender: Kalman Shan ?Weeks in Treatment: 9 ?Vital Signs ?Height(in): 71 ?Pulse(bpm): 73 ?Weight(lbs): 250 ?Blood Pressure(mmHg): 150/76 ?Body Mass Index(BMI): 34.9 ?Temperature(??F): 98.3 ?Respiratory Rate(breaths/min): 16 ?Photos: [N/A:N/A] ?Wound Location: Left Amputation Site - Below Knee N/A N/A ?Wounding Event: Surgical Injury N/A N/A ?Primary Etiology: Open Surgical Wound N/A N/A ?Secondary Etiology: Diabetic Wound/Ulcer of the Lower N/A  N/A ?Extremity ?Comorbid History: Coronary Artery Disease, N/A N/A ?Hypertension, Type I Diabetes ?Date Acquired: 04/12/2021 N/A N/A ?Weeks of Treatment: 9 N/A N/A ?Wound Status: Open N/A N/A ?Wound Recurrence: No N/A N/A ?Pending Amputation on Yes N/A N/A ?Presentation: ?Measurements L x W x D (cm) 3x5.8x0.8 N/A N/A ?Area (cm?) : 13.666 N/A N/A ?Volume (cm?) : 10.933 N/A N/A ?% Reduction in Area: 80.70% N/A N/A ?% Reduction in Volume: 94.70% N/A N/A ?Starting Position 1 (o'clock): 12 ?Ending Position 1 (o'clock): 1 ?Maximum Distance 1 (cm): 0.8 ?Undermining: Yes N/A N/A ?Classification: Full Thickness With Exposed N/A N/A ?Support Structures ?Exudate Amount: Large N/A N/A ?Exudate  Type: Serosanguineous N/A N/A ?Exudate Color: red, brown N/A N/A ?Granulation Amount: Large (67-100%) N/A N/A ?Granulation Quality: Red, Hyper-granulation N/A N/A ?Necrotic Amount: Small (1-33%) N/A N/A ?Exposed Structures: ?Fat Layer (Subcutaneous Tissue): N/A N/A ?Yes ?Bone: Yes ?Fascia: No ?Tendon: No ?Muscle: No ?Joint: No ?Epithelialization: None N/A N/A ?Treatment Notes ?DONSHAY, LUPINSKI ([id number]) ?Electronic Signature(s) ?Signed: 07/27/2021 4:03:21 PM By: Donnamarie Poag ?Entered ByDonnamarie Poag on 07/27/2021 15:39:00 ?JAMAR, WEATHERALL ([id number]) ?-------------------------------------------------------------------------------- ?Multi-Disciplinary Care Plan Details ?Patient Name: ROONEY, SWAILS ?Date of Service: 07/27/2021 3:15 PM ?Medical Record Number: [id number] ?Patient Account Number: [id number] ?Date of Birth/Sex: 01/19/1961 (61 y.o. M) ?Treating RN: Donnamarie Poag ?Primary Care Deborah Dondero: Derinda Late Other Clinician: ?Referring Pavan Bring: Derinda Late ?Treating Ronson Hagins/Extender: Kalman Shan ?Weeks in Treatment: 9 ?Active Inactive ?Wound/Skin Impairment ?Nursing Diagnoses: ?Impaired tissue integrity ?Knowledge deficit related to smoking impact on wound healing ?Knowledge deficit related to ulceration/compromised skin  integrity ?Goals: ?Patient/caregiver will verbalize understanding of skin care regimen ?Date Initiated: 05/25/2021 ?Date Inactivated: 06/15/2021 ?Target Resolution Date: 07/04/2021 ?Goal Status: Met ?Ulcer/skin breakdown will have a volume reduction of 30% by week 4 ?Date Initiated: 05/25/2021 ?Date Inactivated: 06/29/2021 ?Target Resolution Date: 06/22/2021 ?Goal Status: Met ?Ulcer/skin breakdown will have a volume reduction of 50% by week 8 ?Date Initiated: 05/25/2021 ?Date Inactivated: 07/13/2021 ?Target Resolution Date: 07/20/2021 ?Goal Status: Met ?Ulcer/skin breakdown will have a volume reduction of 80% by week 12 ?Date Initiated: 05/25/2021 ?Target Resolution Date: 08/10/2021 ?Goal Status: Active ?Ulcer/skin breakdown will heal within 14 weeks ?Date Initiated: 05/25/2021 ?Target Resolution Date: 09/07/2021 ?Goal Status: Active ?Interventions: ?Assess patient/caregiver ability to obtain necessary supplies ?Assess patient/caregiver ability to perform ulcer/skin care regimen upon admission and as needed ?Assess ulceration(s) every visit ?Notes: ?Electronic Signature(s) ?Signed: 07/27/2021 4:03:21 PM By: Donnamarie Poag ?Entered ByDonnamarie Poag on 07/27/2021 15:38:51 ?DEKLIN, BIELER ([id number]) ?-------------------------------------------------------------------------------- ?Pain Assessment Details ?Patient Name: JHAMAL, PLUCINSKI ?Date of Service: 07/27/2021 3:15 PM ?Medical Record Number: [id number] ?Patient Account Number: [id number] ?Date of Birth/Sex: 09/02/1960 (61 y.o. M) ?Treating RN: Donnamarie Poag ?Primary Care Lorrane Mccay: Derinda Late Other Clinician: ?Referring Makar Slatter: Derinda Late ?Treating Chelsea Pedretti/Extender: Kalman Shan ?Weeks in Treatment: 9 ?Active Problems ?Location of Pain Severity and Description of Pain ?Patient Has Paino No ?Site Locations ?Rate the pain. ?Current Pain Level: 0 ?Pain Management and Medication ?Current Pain Management: ?Electronic Signature(s) ?Signed: 07/27/2021 4:03:21 PM By: Donnamarie Poag ?Entered ByDonnamarie Poag on 07/27/2021 15:20:20 ?MANFORD, SPRONG ([id number]) ?-------------------------------------------------------------------------------- ?Patient/Caregiver Education Details ?Patient Name: SAVANT, [place]

## 2021-08-02 DIAGNOSIS — Z430 Encounter for attention to tracheostomy: Secondary | ICD-10-CM | POA: Diagnosis not present

## 2021-08-03 ENCOUNTER — Encounter: Payer: HMO | Admitting: Internal Medicine

## 2021-08-03 DIAGNOSIS — Z89512 Acquired absence of left leg below knee: Secondary | ICD-10-CM | POA: Diagnosis not present

## 2021-08-05 ENCOUNTER — Other Ambulatory Visit: Payer: Self-pay | Admitting: Psychiatry

## 2021-08-05 DIAGNOSIS — R809 Proteinuria, unspecified: Secondary | ICD-10-CM | POA: Diagnosis not present

## 2021-08-05 DIAGNOSIS — E1029 Type 1 diabetes mellitus with other diabetic kidney complication: Secondary | ICD-10-CM | POA: Diagnosis not present

## 2021-08-05 DIAGNOSIS — F411 Generalized anxiety disorder: Secondary | ICD-10-CM

## 2021-08-05 DIAGNOSIS — F331 Major depressive disorder, recurrent, moderate: Secondary | ICD-10-CM

## 2021-08-06 ENCOUNTER — Other Ambulatory Visit: Payer: Self-pay | Admitting: Psychiatry

## 2021-08-06 DIAGNOSIS — T8189XA Other complications of procedures, not elsewhere classified, initial encounter: Secondary | ICD-10-CM | POA: Diagnosis not present

## 2021-08-06 DIAGNOSIS — F411 Generalized anxiety disorder: Secondary | ICD-10-CM

## 2021-08-08 DIAGNOSIS — T148XXD Other injury of unspecified body region, subsequent encounter: Secondary | ICD-10-CM | POA: Diagnosis not present

## 2021-08-08 DIAGNOSIS — Z792 Long term (current) use of antibiotics: Secondary | ICD-10-CM | POA: Diagnosis not present

## 2021-08-08 DIAGNOSIS — F419 Anxiety disorder, unspecified: Secondary | ICD-10-CM | POA: Diagnosis not present

## 2021-08-08 DIAGNOSIS — F32A Depression, unspecified: Secondary | ICD-10-CM | POA: Diagnosis not present

## 2021-08-08 DIAGNOSIS — I10 Essential (primary) hypertension: Secondary | ICD-10-CM | POA: Diagnosis not present

## 2021-08-08 DIAGNOSIS — I739 Peripheral vascular disease, unspecified: Secondary | ICD-10-CM | POA: Diagnosis not present

## 2021-08-08 DIAGNOSIS — Z7901 Long term (current) use of anticoagulants: Secondary | ICD-10-CM | POA: Diagnosis not present

## 2021-08-08 DIAGNOSIS — E119 Type 2 diabetes mellitus without complications: Secondary | ICD-10-CM | POA: Diagnosis not present

## 2021-08-08 DIAGNOSIS — Z4781 Encounter for orthopedic aftercare following surgical amputation: Secondary | ICD-10-CM | POA: Diagnosis not present

## 2021-08-08 DIAGNOSIS — Z89512 Acquired absence of left leg below knee: Secondary | ICD-10-CM | POA: Diagnosis not present

## 2021-08-09 DIAGNOSIS — I739 Peripheral vascular disease, unspecified: Secondary | ICD-10-CM | POA: Diagnosis not present

## 2021-08-09 DIAGNOSIS — T148XXD Other injury of unspecified body region, subsequent encounter: Secondary | ICD-10-CM | POA: Diagnosis not present

## 2021-08-09 DIAGNOSIS — I10 Essential (primary) hypertension: Secondary | ICD-10-CM | POA: Diagnosis not present

## 2021-08-09 DIAGNOSIS — F32A Depression, unspecified: Secondary | ICD-10-CM | POA: Diagnosis not present

## 2021-08-09 DIAGNOSIS — E119 Type 2 diabetes mellitus without complications: Secondary | ICD-10-CM | POA: Diagnosis not present

## 2021-08-09 DIAGNOSIS — F419 Anxiety disorder, unspecified: Secondary | ICD-10-CM | POA: Diagnosis not present

## 2021-08-09 DIAGNOSIS — Z4781 Encounter for orthopedic aftercare following surgical amputation: Secondary | ICD-10-CM | POA: Diagnosis not present

## 2021-08-10 ENCOUNTER — Encounter: Payer: HMO | Attending: Internal Medicine | Admitting: Internal Medicine

## 2021-08-10 DIAGNOSIS — E1051 Type 1 diabetes mellitus with diabetic peripheral angiopathy without gangrene: Secondary | ICD-10-CM | POA: Insufficient documentation

## 2021-08-10 DIAGNOSIS — Z89512 Acquired absence of left leg below knee: Secondary | ICD-10-CM | POA: Diagnosis not present

## 2021-08-10 DIAGNOSIS — L97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: Secondary | ICD-10-CM | POA: Insufficient documentation

## 2021-08-10 DIAGNOSIS — T8131XA Disruption of external operation (surgical) wound, not elsewhere classified, initial encounter: Secondary | ICD-10-CM | POA: Diagnosis not present

## 2021-08-10 DIAGNOSIS — Z794 Long term (current) use of insulin: Secondary | ICD-10-CM | POA: Diagnosis not present

## 2021-08-10 DIAGNOSIS — E10622 Type 1 diabetes mellitus with other skin ulcer: Secondary | ICD-10-CM | POA: Diagnosis not present

## 2021-08-10 NOTE — Progress Notes (Signed)
BRITTANY, AMIRAULT ([id number]) ?Visit Report for 08/10/2021 ?Chief Complaint Document Details ?Patient Name: JACHIN, COURY ?Date of Service: 08/10/2021 3:15 PM ?Medical Record Number: [id number] ?Patient Account Number: [id number] ?Date of Birth/Sex: 05-17-1960 (61 y.o. M) ?Treating RN: Donnamarie Poag ?Primary Care Provider: Derinda Late Other Clinician: ?Referring Provider: Derinda Late ?Treating Provider/Extender: Kalman Shan ?Weeks in Treatment: 11 ?Information Obtained from: Patient ?Chief Complaint ?Left lower extremity amputation site wound ?Electronic Signature(s) ?Signed: 08/10/2021 4:22:23 PM By: Kalman Shan DO ?Entered By: Kalman Shan on 08/10/2021 15:53:58 ?ANAIS, KOENEN ([id number]) ?-------------------------------------------------------------------------------- ?Debridement Details ?Patient Name: JOHNNELL, LIOU ?Date of Service: 08/10/2021 3:15 PM ?Medical Record Number: [id number] ?Patient Account Number: [id number] ?Date of Birth/Sex: 1960-08-01 (61 y.o. M) ?Treating RN: Donnamarie Poag ?Primary Care Provider: Derinda Late Other Clinician: ?Referring Provider: Derinda Late ?Treating Provider/Extender: Kalman Shan ?Weeks in Treatment: 11 ?Debridement Performed for ?Wound #1 Left Amputation Site - Below Knee ?Assessment: ?Performed By: Physician Kalman Shan, MD ?Debridement Type: Debridement ?Severity of Tissue Pre Debridement: Fat layer exposed ?Level of Consciousness (Pre- ?Awake and Alert ?procedure): ?Pre-procedure Verification/Time Out ?Yes - 15:35 ?Taken: ?Start Time: 15:36 ?Pain Control: Lidocaine ?Total Area Debrided (L x W): 2.8 (cm) x 5 (cm) = 14 (cm?) ?Tissue and other material ?Viable, Non-Viable, Slough, Subcutaneous, [place]um ?debrided: ?Level: Skin/Subcutaneous Tissue ?Debridement Description: Excisional ?Instrument: Curette ?Bleeding: Minimum ?Hemostasis Achieved: Pressure ?Response to Treatment: Procedure was tolerated well ?Level of Consciousness (Post- ?Awake and  Alert ?procedure): ?Post Debridement Measurements of Total Wound ?Length: (cm) 2.8 ?Width: (cm) 5 ?Depth: (cm) 0.5 ?Volume: (cm?) 5.498 ?Character of Wound/Ulcer Post Debridement: Improved ?Severity of Tissue Post Debridement: Fat layer exposed ?Post Procedure Diagnosis ?Same as Pre-procedure ?Electronic Signature(s) ?Signed: 08/10/2021 4:03:35 PM By: Donnamarie Poag ?Signed: 08/10/2021 4:22:23 PM By: Kalman Shan DO ?Entered ByDonnamarie Poag on 08/10/2021 15:40:01 ?ELISHA, COOKSEY ([id number]) ?-------------------------------------------------------------------------------- ?HPI Details ?Patient Name: CULLIN, DISHMAN ?Date of Service: 08/10/2021 3:15 PM ?Medical Record Number: [id number] ?Patient Account Number: [id number] ?Date of Birth/Sex: 15-Jan-1961 (61 y.o. M) ?Treating RN: Donnamarie Poag ?Primary Care Provider: Derinda Late Other Clinician: ?Referring Provider: Derinda Late ?Treating Provider/Extender: Kalman Shan ?Weeks in Treatment: 11 ?History of Present Illness ?HPI Description: Admission 05/25/2021 ?Mr. Joeanthony Seeling is a 61 year old male with a past medical history of uncontrolled type 1 diabetes on insulin with last hemoglobin A1c of 8.8, ?peripheral arterial disease, status post left BKA that presents to the clinic for a left BKA amputation site wound. 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 The wound site became infected and he underwent an IandD on ?05/07/2021. The wound was left open at that time. He has been using wet-to-dry dressings for the past 3 weeks. He currently denies Systemic ?signs of infection. ?1/25; patient presents for follow-up. He has been using Dakin's wet-to-dry dressings with improvement in wound healing. He has no issues or ?complaints today. He denies signs of infection. ?2/8; Patient presents for follow-up. He continues To use Dakin's wet-to-dry dressings. He has no issues or complaints today. He denies signs  of ?infection. He follows up with his surgeon next week. ?2/22; patient presents for follow-up. He continues to use Dakin's wet-to-dry dressings. He denies signs of infection. He saw his vascular surgeon ?on 06/24/2021. They took out the staples. Nothing further to report on. ?3/8; patient presents for follow-up. He has been using Dakin's wet-to-dry dressings without issues. He denies signs of infection. ?3/22; patient presents for follow-up. She has been using the wound vac  without any issues. She states that in the past 2 days she has had more ?tenderness to the wound bed. She denies signs of infection. ?4/5; patient presents for follow-up. He has been using the wound VAC without issues. He has noticed improvement in wound healing. ?Electronic Signature(s) ?Signed: 08/10/2021 4:22:23 PM By: Kalman Shan DO ?Entered By: Kalman Shan on 08/10/2021 15:55:45 ?JUVENTINO, PAVONE ([id number]) ?-------------------------------------------------------------------------------- ?Physical Exam Details ?Patient Name: SALMAAN, PATCHIN ?Date of Service: 08/10/2021 3:15 PM ?Medical Record Number: [id number] ?Patient Account Number: [id number] ?Date of Birth/Sex: [birth year] (61 y.o. M) ?Treating RN: Donnamarie Poag ?Primary Care Provider: Derinda Late Other Clinician: ?Referring Provider: Derinda Late ?Treating Provider/Extender: Kalman Shan ?Weeks in Treatment: 11 ?Constitutional ?. ?Psychiatric ?Marland Kitchen ?Notes ?Left BKA with wound at the amputation site with mostly granulation tissue and some slough. There is some undermining with this does not probe ?to bone. No signs of surrounding infection. Overall appears well-healing. ?Electronic Signature(s) ?Signed: 08/10/2021 4:22:23 PM By: Kalman Shan DO ?Entered By: Kalman Shan on 08/10/2021 15:56:21 ?DENCIL, CAYSON ([id number]) ?-------------------------------------------------------------------------------- ?Physician Orders Details ?Patient Name: ZAMARION, LONGEST ?Date of  Service: 08/10/2021 3:15 PM ?Medical Record Number: [id number] ?Patient Account Number: [id number] ?Date of Birth/Sex: 1960-09-01 (61 y.o. M) ?Treating RN: Donnamarie Poag ?Primary Care Provider: Derinda Late Other Clinician: ?Referring Provider: Derinda Late ?Treating Provider/Extender: Kalman Shan ?Weeks in Treatment: 11 ?Verbal / Phone Orders: No ?Diagnosis Coding ?Follow-up Appointments ?o Return Appointment in 2 weeks. ?Home Health ?[facility] ?o [place] for wound care. May utilize formulary equivalent dressing for wound treatment orders unless ?otherwise specified. Home Health Nurse may visit [facility] to address patientos wound care needs. - CONTACT [facility] IF ANY ?PROBLEMS OR QUESTIONS OR CONCERNS OF USE ?o Scheduled days for dressing changes to be completed; exception, patient has scheduled wound care visit that day. ?o **Please direct any NON-WOUND related issues/requests for orders to patient's Primary Care Physician. **If current ?dressing causes regression in wound condition, may D/C ordered dressing product/s and apply Normal Saline Moist ?Dressing daily until next [place] or Other MD appointment. **Notify [facility] of regression in ?wound condition at [phone]. ?Bathing/ Shower/ Hygiene ?o No tub bath. ?Anesthetic (Use 'Patient Medications' Section for Anesthetic Order Entry) ?o Lidocaine applied to wound bed ?Edema Control - Lymphedema / Segmental Compressive Device / Other ?o DO YOUR BEST to sleep in the bed at night. DO NOT sleep in your recliner. Long hours of sitting in a recliner leads to ?swelling of the legs and/or potential wounds on your backside. ?Additional Orders / Instructions ?o Follow Nutritious Diet and Increase Protein Intake - monitor blood sugar to promote wound healing ?o Other: - Keep appointment with surgeon and PM and R after healed for prostheis as they had scheduled ?Negative Pressure Wound  Therapy ?o Wound VAC settings at 158mHg continuous pressure. Use foam to wound cavity. Please order WHITE foam to fill any ?tunnel/s and/or undermining when necessary. Change VAC dressing 3 X WEEK. Change canister as indica

## 2021-08-10 NOTE — Progress Notes (Signed)
TARANCE, BALAN ([id number]) ?Visit Report for 08/10/2021 ?Arrival Information Details ?Patient Name: [name], [name] ?Date of Service: 08/10/2021 3:15 PM ?Medical Record Number: [id number] ?Patient Account Number: [id number] ?Date of Birth/Sex: [birth year] (61 y.o. M) ?Treating RN: Donnamarie Poag ?Primary Care Marvie Brevik: Derinda Late Other Clinician: ?Referring Azhane Eckart: Derinda Late ?Treating Catilyn Boggus/Extender: Kalman Shan ?Weeks in Treatment: 11 ?Visit Information History Since Last Visit ?Added or deleted any medications: No ?Patient Arrived: Wheel Chair ?Had a fall or experienced change in No ?Arrival Time: 15:21 ?activities of daily living that may affect ?Accompanied By: mother ?risk of falls: ?Transfer Assistance: [employer] Patient Lift ?Hospitalized since last visit: No ?Patient Identification Verified: Yes ?Has Dressing in Place as Prescribed: Yes ?Secondary Verification Process Completed: Yes ?Pain Present Now: No ?Patient Requires Transmission-Based No ?Precautions: ?Patient Has Alerts: Yes ?Patient Alerts: Patient on Blood ?Thinner ?Eliquis ?Diabetic ?Electronic Signature(s) ?Signed: 08/10/2021 4:03:35 PM By: Donnamarie Poag ?Entered ByDonnamarie Poag on 08/10/2021 15:25:03 ?JERMAYNE, SWEENEY ([id number]) ?-------------------------------------------------------------------------------- ?Encounter Discharge Information Details ?Patient Name: [name], [name] ?Date of Service: 08/10/2021 3:15 PM ?Medical Record Number: [id number] ?Patient Account Number: [id number] ?Date of Birth/Sex: Sep 17, 1960 (61 y.o. M) ?Treating RN: Donnamarie Poag ?Primary Care Lehi Phifer: Derinda Late Other Clinician: ?Referring Zhane Bluitt: Derinda Late ?Treating Tattiana Fakhouri/Extender: Kalman Shan ?Weeks in Treatment: 11 ?Encounter Discharge Information Items Post Procedure Vitals ?Discharge Condition: Stable ?Temperature (?F): 98.6 ?Ambulatory Status: Wheelchair ?Pulse (bpm): 78 ?Discharge Destination: Home Health ?Respiratory Rate (breaths/min):  16 ?Telephoned: No ?Blood Pressure (mmHg): 135/70 ?Orders Sent: Yes ?Transportation: Private Auto ?Accompanied By: mother ?Schedule Follow-up Appointment: Yes ?Clinical Summary of Care: ?Electronic Signature(s) ?Signed: 08/10/2021 4:03:35 PM By: Donnamarie Poag ?Entered ByDonnamarie Poag on 08/10/2021 15:53:06 ?ADITHYA, DIFRANCESCO ([id number]) ?-------------------------------------------------------------------------------- ?Lower Extremity Assessment Details ?Patient Name: [name], [name] ?Date of Service: 08/10/2021 3:15 PM ?Medical Record Number: [id number] ?Patient Account Number: [id number] ?Date of Birth/Sex: May 24, 1960 (61 y.o. M) ?Treating RN: Donnamarie Poag ?Primary Care Rip Hawes: Derinda Late Other Clinician: ?Referring Karry Barrilleaux: Derinda Late ?Treating Nichalas Coin/Extender: Kalman Shan ?Weeks in Treatment: 11 ?Notes ?L BKA ?Electronic Signature(s) ?Signed: 08/10/2021 4:03:35 PM By: Donnamarie Poag ?Entered ByDonnamarie Poag on 08/10/2021 15:33:32 ?MAHER, SHON ([id number]) ?-------------------------------------------------------------------------------- ?Multi Wound Chart Details ?Patient Name: [name], [name] ?Date of Service: 08/10/2021 3:15 PM ?Medical Record Number: [id number] ?Patient Account Number: [id number] ?Date of Birth/Sex: [birth year] (61 y.o. M) ?Treating RN: Donnamarie Poag ?Primary Care Malacai Grantz: Derinda Late Other Clinician: ?Referring Garvin Ellena: Derinda Late ?Treating Zarin Hagmann/Extender: Kalman Shan ?Weeks in Treatment: 11 ?Vital Signs ?Height(in): 71 ?Pulse(bpm): 78 ?Weight(lbs): 250 ?Blood Pressure(mmHg): 135/70 ?Body Mass Index(BMI): 34.9 ?Temperature(??F): 98.6 ?Respiratory Rate(breaths/min): 18 ?Photos: [N/A:N/A] ?Wound Location: Left Amputation Site - Below Knee N/A N/A ?Wounding Event: Surgical Injury N/A N/A ?Primary Etiology: Open Surgical Wound N/A N/A ?Secondary Etiology: Diabetic Wound/Ulcer of the Lower N/A N/A ?Extremity ?Comorbid History: Coronary Artery Disease, N/A N/A ?Hypertension, Type I  Diabetes ?Date Acquired: 04/12/2021 N/A N/A ?Weeks of Treatment: 11 N/A N/A ?Wound Status: Open N/A N/A ?Wound Recurrence: No N/A N/A ?Pending Amputation on Yes N/A N/A ?Presentation: ?Measurements L x W x D (cm) 2.8x5x0.5 N/A N/A ?Area (cm?) : 10.996 N/A N/A ?Volume (cm?) : 5.498 N/A N/A ?% Reduction in Area: 84.40% N/A N/A ?% Reduction in Volume: 97.30% N/A N/A ?Starting Position 1 (o'clock): 11 ?Ending Position 1 (o'clock): 1 ?Maximum Distance 1 (cm): 0.8 ?Undermining: Yes N/A N/A ?Classification: Full Thickness With Exposed N/A N/A ?Support Structures ?Exudate Amount: Large N/A N/A ?Exudate Type: Serosanguineous N/A N/A ?Exudate Color: red, brown N/A N/A ?Granulation Amount:  Large (67-100%) N/A N/A ?Granulation Quality: Red, Hyper-granulation N/A N/A ?Necrotic Amount: Small (1-33%) N/A N/A ?Exposed Structures: ?Fat Layer (Subcutaneous Tissue): N/A N/A ?Yes ?Bone: Yes ?Fascia: No ?Tendon: No ?Muscle: No ?Joint: No ?Epithelialization: None N/A N/A ?Treatment Notes ?[name], [name] ([id number]) ?Electronic Signature(s) ?Signed: 08/10/2021 4:03:35 PM By: Donnamarie Poag ?Entered ByDonnamarie Poag on 08/10/2021 15:34:12 ?[name], [name] ([id number]) ?-------------------------------------------------------------------------------- ?Multi-Disciplinary Care Plan Details ?Patient Name: [name], [name] ?Date of Service: 08/10/2021 3:15 PM ?Medical Record Number: [id number] ?Patient Account Number: [id number] ?Date of Birth/Sex: 1961/03/23 (61 y.o. M) ?Treating RN: Donnamarie Poag ?Primary Care Suzzane Quilter: Derinda Late Other Clinician: ?Referring Rumaldo Difatta: Derinda Late ?Treating Caylynn Minchew/Extender: Kalman Shan ?Weeks in Treatment: 11 ?Active Inactive ?Wound/Skin Impairment ?Nursing Diagnoses: ?Impaired tissue integrity ?Knowledge deficit related to smoking impact on wound healing ?Knowledge deficit related to ulceration/compromised skin integrity ?Goals: ?Patient/caregiver will verbalize understanding of skin care regimen ?Date  Initiated: 05/25/2021 ?Date Inactivated: 06/15/2021 ?Target Resolution Date: 07/04/2021 ?Goal Status: Met ?Ulcer/skin breakdown will have a volume reduction of 30% by week 4 ?Date Initiated: 05/25/2021 ?Date Inactivated: 06/29/2021 ?Target Resolution Date: 06/22/2021 ?Goal Status: Met ?Ulcer/skin breakdown will have a volume reduction of 50% by week 8 ?Date Initiated: 05/25/2021 ?Date Inactivated: 07/13/2021 ?Target Resolution Date: 07/20/2021 ?Goal Status: Met ?Ulcer/skin breakdown will have a volume reduction of 80% by week 12 ?Date Initiated: 05/25/2021 ?Target Resolution Date: 08/10/2021 ?Goal Status: Active ?Ulcer/skin breakdown will heal within 14 weeks ?Date Initiated: 05/25/2021 ?Target Resolution Date: 09/07/2021 ?Goal Status: Active ?Interventions: ?Assess patient/caregiver ability to obtain necessary supplies ?Assess patient/caregiver ability to perform ulcer/skin care regimen upon admission and as needed ?Assess ulceration(s) every visit ?Notes: ?Electronic Signature(s) ?Signed: 08/10/2021 4:03:35 PM By: Donnamarie Poag ?Entered ByDonnamarie Poag on 08/10/2021 15:33:45 ?[name], [name] ([id number]) ?-------------------------------------------------------------------------------- ?Pain Assessment Details ?Patient Name: [name], [name] ?Date of Service: 08/10/2021 3:15 PM ?Medical Record Number: [id number] ?Patient Account Number: [id number] ?Date of Birth/Sex: 08/31/1960 (61 y.o. M) ?Treating RN: Donnamarie Poag ?Primary Care Lionel Woodberry: Derinda Late Other Clinician: ?Referring Blayden Conwell: Derinda Late ?Treating Ethel Veronica/Extender: Kalman Shan ?Weeks in Treatment: 11 ?Active Problems ?Location of Pain Severity and Description of Pain ?Patient Has Paino No ?Site Locations ?Rate the pain. ?Current Pain Level: 0 ?Pain Management and Medication ?Current Pain Management: ?Electronic Signature(s) ?Signed: 08/10/2021 4:03:35 PM By: Donnamarie Poag ?Entered ByDonnamarie Poag on 08/10/2021 15:30:48 ?[name], [name]  ([id number]) ?-------------------------------------------------------------------------------- ?Patient/Caregiver Education Details ?Patient Name: [name], [name] ?Date of Service: 08/10/2021 3:15 PM ?Medical Record Number: [id number] ?Patient Acco

## 2021-08-12 DIAGNOSIS — Z430 Encounter for attention to tracheostomy: Secondary | ICD-10-CM | POA: Diagnosis not present

## 2021-08-13 DIAGNOSIS — T8189XA Other complications of procedures, not elsewhere classified, initial encounter: Secondary | ICD-10-CM | POA: Diagnosis not present

## 2021-08-17 ENCOUNTER — Encounter: Payer: HMO | Admitting: Internal Medicine

## 2021-08-18 ENCOUNTER — Other Ambulatory Visit: Payer: Self-pay | Admitting: Psychiatry

## 2021-08-18 DIAGNOSIS — F331 Major depressive disorder, recurrent, moderate: Secondary | ICD-10-CM

## 2021-08-18 DIAGNOSIS — T8189XA Other complications of procedures, not elsewhere classified, initial encounter: Secondary | ICD-10-CM | POA: Diagnosis not present

## 2021-08-23 DIAGNOSIS — R809 Proteinuria, unspecified: Secondary | ICD-10-CM | POA: Diagnosis not present

## 2021-08-23 DIAGNOSIS — E1029 Type 1 diabetes mellitus with other diabetic kidney complication: Secondary | ICD-10-CM | POA: Diagnosis not present

## 2021-08-24 ENCOUNTER — Encounter (HOSPITAL_BASED_OUTPATIENT_CLINIC_OR_DEPARTMENT_OTHER): Payer: HMO | Admitting: Internal Medicine

## 2021-08-24 DIAGNOSIS — T8131XA Disruption of external operation (surgical) wound, not elsewhere classified, initial encounter: Secondary | ICD-10-CM | POA: Diagnosis not present

## 2021-08-24 DIAGNOSIS — L97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: Secondary | ICD-10-CM | POA: Diagnosis not present

## 2021-08-24 NOTE — Progress Notes (Signed)
[name] ([id number]) ?Visit Report for 08/24/2021 ?Arrival Information Details ?Patient Name: [name], [name] ?Date of Service: 08/24/2021 2:15 PM ?Medical Record Number: [id number] ?Patient Account Number: [id number] ?Date of Birth/Sex: Aug 09, 1960 (61 y.o. M) ?Treating RN: Donnamarie Poag ?Primary Care Khamia Stambaugh: Derinda Late Other Clinician: ?Referring Maren Wiesen: Derinda Late ?Treating Yamna Mackel/Extender: Kalman Shan ?Weeks in Treatment: 13 ?Visit Information History Since Last Visit ?Added or deleted any medications: No ?Patient Arrived: Wheel Chair ?Had a fall or experienced change in No ?Arrival Time: 14:06 ?activities of daily living that may affect ?Accompanied By: self ?risk of falls: ?Transfer Assistance: EasyPivot Patient Lift ?Hospitalized since last visit: No ?Patient Identification Verified: Yes ?Has Dressing in Place as Prescribed: Yes ?Secondary Verification Process Completed: Yes ?Pain Present Now: No ?Patient Requires Transmission-Based No ?Precautions: ?Patient Has Alerts: Yes ?Patient Alerts: Patient on Blood ?Thinner ?Eliquis ?Diabetic ?Electronic Signature(s) ?Signed: 08/24/2021 3:02:04 PM By: Donnamarie Poag ?Entered ByDonnamarie Poag on 08/24/2021 14:12:54 ?[name] ([id number]) ?-------------------------------------------------------------------------------- ?Encounter Discharge Information Details ?Patient Name: [name], [name] ?Date of Service: 08/24/2021 2:15 PM ?Medical Record Number: [id number] ?Patient Account Number: [id number] ?Date of Birth/Sex: [birth year] (61 y.o. M) ?Treating RN: Donnamarie Poag ?Primary Care Isauro Skelley: Derinda Late Other Clinician: ?Referring Adian Jablonowski: Derinda Late ?Treating Antionio Negron/Extender: Kalman Shan ?Weeks in Treatment: 13 ?Encounter Discharge Information Items Post Procedure Vitals ?Discharge Condition: Stable ?Temperature (?F): 98.1 ?Ambulatory Status: Wheelchair ?Pulse (bpm): 70 ?Discharge Destination: Home Health ?Respiratory Rate (breaths/min):  16 ?Telephoned: No ?Blood Pressure (mmHg): 137/74 ?Orders Sent: Yes ?Transportation: Private Auto ?Accompanied By: caregiver ?Schedule Follow-up Appointment: Yes ?Clinical Summary of Care: ?Electronic Signature(s) ?Signed: 08/24/2021 3:02:04 PM By: Donnamarie Poag ?Entered ByDonnamarie Poag on 08/24/2021 14:42:26 ?[name] ([id number]) ?-------------------------------------------------------------------------------- ?Lower Extremity Assessment Details ?Patient Name: [name], [name] ?Date of Service: 08/24/2021 2:15 PM ?Medical Record Number: [id number] ?Patient Account Number: [id number] ?Date of Birth/Sex: 1961/01/03 (61 y.o. M) ?Treating RN: Donnamarie Poag ?Primary Care Kellyn Mccary: Derinda Late Other Clinician: ?Referring Letishia Elliott: Derinda Late ?Treating Desta Bujak/Extender: Kalman Shan ?Weeks in Treatment: 13 ?Edema Assessment ?Assessed: [Left: Yes] [Right: No] ?Edema: [Left: N] [Right: o] ?Notes ?L BKA ?Electronic Signature(s) ?Signed: 08/24/2021 3:02:04 PM By: Donnamarie Poag ?Entered ByDonnamarie Poag on 08/24/2021 14:18:15 ?[name], [name] ([id number]) ?-------------------------------------------------------------------------------- ?Multi Wound Chart Details ?Patient Name: [name], [name] ?Date of Service: 08/24/2021 2:15 PM ?Medical Record Number: [id number] ?Patient Account Number: [id number] ?Date of Birth/Sex: [birth year] (61 y.o. M) ?Treating RN: Donnamarie Poag ?Primary Care Jahnya Trindade: Derinda Late Other Clinician: ?Referring Janette Harvie: Derinda Late ?Treating Joseth Weigel/Extender: Kalman Shan ?Weeks in Treatment: 13 ?Vital Signs ?Height(in): 71 ?Pulse(bpm): 70 ?Weight(lbs): 250 ?Blood Pressure(mmHg): 137/74 ?Body Mass Index(BMI): 34.9 ?Temperature(??F): 98.1 ?Respiratory Rate(breaths/min): 16 ?Photos: [N/A:N/A] ?Wound Location: Left Amputation Site - Below Knee N/A N/A ?Wounding Event: Surgical Injury N/A N/A ?Primary Etiology: Open Surgical Wound N/A N/A ?Secondary Etiology: Diabetic Wound/Ulcer of the Lower N/A  N/A ?Extremity ?Comorbid History: Coronary Artery Disease, N/A N/A ?Hypertension, Type I Diabetes ?Date Acquired: 04/12/2021 N/A N/A ?Weeks of Treatment: 13 N/A N/A ?Wound Status: Open N/A N/A ?Wound Recurrence: No N/A N/A ?Pending Amputation on Yes N/A N/A ?Presentation: ?Measurements L x W x D (cm) 2x5x0.5 N/A N/A ?Area (cm?) : 7.854 N/A N/A ?Volume (cm?) : 3.927 N/A N/A ?% Reduction in Area: 88.90% N/A N/A ?% Reduction in Volume: 98.10% N/A N/A ?Starting Position 1 (o'clock): 11 ?Ending Position 1 (o'clock): 1 ?Maximum Distance 1 (cm): 0.5 ?Undermining: Yes N/A N/A ?Classification: Full Thickness With Exposed N/A N/A ?Support Structures ?Exudate Amount: Large N/A N/A ?Exudate  Type: Serosanguineous N/A N/A ?Exudate Color: red, brown N/A N/A ?Granulation Amount: Large (67-100%) N/A N/A ?Granulation Quality: Red, Hyper-granulation N/A N/A ?Necrotic Amount: Small (1-33%) N/A N/A ?Exposed Structures: ?Fat Layer (Subcutaneous Tissue): N/A N/A ?Yes ?Bone: Yes ?Fascia: No ?Tendon: No ?Muscle: No ?Joint: No ?Epithelialization: None N/A N/A ?Treatment Notes ?[name], [name] ([id number]) ?Electronic Signature(s) ?Signed: 08/24/2021 3:02:04 PM By: Donnamarie Poag ?Entered ByDonnamarie Poag on 08/24/2021 14:31:11 ?[name], [name] ([id number]) ?-------------------------------------------------------------------------------- ?Multi-Disciplinary Care Plan Details ?Patient Name: [name], [name] ?Date of Service: 08/24/2021 2:15 PM ?Medical Record Number: [id number] ?Patient Account Number: [id number] ?Date of Birth/Sex: [birth year] (61 y.o. M) ?Treating RN: Donnamarie Poag ?Primary Care Lundy Cozart: Derinda Late Other Clinician: ?Referring Dasean Brow: Derinda Late ?Treating Damiel Barthold/Extender: Kalman Shan ?Weeks in Treatment: 13 ?Active Inactive ?Wound/Skin Impairment ?Nursing Diagnoses: ?Impaired tissue integrity ?Knowledge deficit related to smoking impact on wound healing ?Knowledge deficit related to ulceration/compromised skin  integrity ?Goals: ?Patient/caregiver will verbalize understanding of skin care regimen ?Date Initiated: 05/25/2021 ?Date Inactivated: 06/15/2021 ?Target Resolution Date: 07/04/2021 ?Goal Status: Met ?Ulcer/skin breakdown will have a volume reduction of 30% by week 4 ?Date Initiated: 05/25/2021 ?Date Inactivated: 06/29/2021 ?Target Resolution Date: 06/22/2021 ?Goal Status: Met ?Ulcer/skin breakdown will have a volume reduction of 50% by week 8 ?Date Initiated: 05/25/2021 ?Date Inactivated: 07/13/2021 ?Target Resolution Date: 07/20/2021 ?Goal Status: Met ?Ulcer/skin breakdown will have a volume reduction of 80% by week 12 ?Date Initiated: 05/25/2021 ?Target Resolution Date: 08/10/2021 ?Goal Status: Active ?Ulcer/skin breakdown will heal within 14 weeks ?Date Initiated: 05/25/2021 ?Target Resolution Date: 09/07/2021 ?Goal Status: Active ?Interventions: ?Assess patient/caregiver ability to obtain necessary supplies ?Assess patient/caregiver ability to perform ulcer/skin care regimen upon admission and as needed ?Assess ulceration(s) every visit ?Notes: ?Electronic Signature(s) ?Signed: 08/24/2021 3:02:04 PM By: Donnamarie Poag ?Entered ByDonnamarie Poag on 08/24/2021 14:30:15 ?[name], [name] ([id number]) ?-------------------------------------------------------------------------------- ?Pain Assessment Details ?Patient Name: [name], [name] ?Date of Service: 08/24/2021 2:15 PM ?Medical Record Number: [id number] ?Patient Account Number: [id number] ?Date of Birth/Sex: 1960-09-19 (61 y.o. M) ?Treating RN: Donnamarie Poag ?Primary Care Dalaney Needle: Derinda Late Other Clinician: ?Referring Edith Groleau: Derinda Late ?Treating Breken Nazari/Extender: Kalman Shan ?Weeks in Treatment: 13 ?Active Problems ?Location of Pain Severity and Description of Pain ?Patient Has Paino No ?Site Locations ?Rate the pain. ?Current Pain Level: 0 ?Pain Management and Medication ?Current Pain Management: ?Electronic Signature(s) ?Signed: 08/24/2021 3:02:04 PM By: Donnamarie Poag ?Entered ByDonnamarie Poag on 08/24/2021 14:15:14 ?YOVANY, CLOCK ([id number]) ?-------------------------------------------------------------------------------- ?Patient/Caregiver Education Details ?Patient Name: BEJAR,

## 2021-08-24 NOTE — Progress Notes (Signed)
FINNICK, OROSZ ([id number]) ?Visit Report for 08/24/2021 ?Chief Complaint Document Details ?Patient Name: [name], [name] ?Date of Service: 08/24/2021 2:15 PM ?Medical Record Number: [id number] ?Patient Account Number: [id number] ?Date of Birth/Sex: May 17, 1960 (61 y.o. M) ?Treating RN: Donnamarie Poag ?Primary Care Provider: Derinda Late Other Clinician: ?Referring Provider: Derinda Late ?Treating Provider/Extender: Kalman Shan ?Weeks in Treatment: 13 ?Information Obtained from: Patient ?Chief Complaint ?Left lower extremity amputation site wound ?Electronic Signature(s) ?Signed: 08/24/2021 4:30:07 PM By: Kalman Shan DO ?Entered By: Kalman Shan on 08/24/2021 16:07:29 ?[name], [name] ([id number]) ?-------------------------------------------------------------------------------- ?Debridement Details ?Patient Name: [name], [name] ?Date of Service: 08/24/2021 2:15 PM ?Medical Record Number: [id number] ?Patient Account Number: [id number] ?Date of Birth/Sex: 03/17/1961 (61 y.o. M) ?Treating RN: Donnamarie Poag ?Primary Care Provider: Derinda Late Other Clinician: ?Referring Provider: Derinda Late ?Treating Provider/Extender: Kalman Shan ?Weeks in Treatment: 13 ?Debridement Performed for ?Wound #1 Left Amputation Site - Below Knee ?Assessment: ?Performed By: Physician Kalman Shan, MD ?Debridement Type: Debridement ?Severity of Tissue Pre Debridement: Fat layer exposed ?Level of Consciousness (Pre- ?Awake and Alert ?procedure): ?Pre-procedure Verification/Time Out ?Yes - 14:29 ?Taken: ?Start Time: 14:30 ?Pain Control: Lidocaine ?Total Area Debrided (L x W): 2 (cm) x 5 (cm) = 10 (cm?) ?Tissue and other material ?Viable, Non-Viable, Slough, Subcutaneous, [place] ?debrided: ?Level: Skin/Subcutaneous Tissue ?Debridement Description: Excisional ?Instrument: Curette ?Bleeding: Moderate ?Hemostasis Achieved: Pressure ?Response to Treatment: Procedure was tolerated well ?Level of Consciousness (Post- ?Awake and  Alert ?procedure): ?Post Debridement Measurements of Total Wound ?Length: (cm) 2 ?Width: (cm) 5 ?Depth: (cm) 0.5 ?Volume: (cm?) 3.927 ?Character of Wound/Ulcer Post Debridement: Improved ?Severity of Tissue Post Debridement: Fat layer exposed ?Post Procedure Diagnosis ?Same as Pre-procedure ?Electronic Signature(s) ?Signed: 08/24/2021 3:02:04 PM By: Donnamarie Poag ?Signed: 08/24/2021 4:30:07 PM By: Kalman Shan DO ?Entered ByDonnamarie Poag on 08/24/2021 14:34:08 ?[name], [name] ([id number]) ?-------------------------------------------------------------------------------- ?HPI Details ?Patient Name: [name], [name] ?Date of Service: 08/24/2021 2:15 PM ?Medical Record Number: [id number] ?Patient Account Number: [id number] ?Date of Birth/Sex: 06-16-1960 (61 y.o. M) ?Treating RN: Donnamarie Poag ?Primary Care Provider: Derinda Late Other Clinician: ?Referring Provider: Derinda Late ?Treating Provider/Extender: Kalman Shan ?Weeks in Treatment: 13 ?History of Present Illness ?HPI Description: Admission 05/25/2021 ?Mr. Breeze Angell is a 61 year old male with a past medical history of uncontrolled type 1 diabetes on insulin with last hemoglobin A1c of 8.8, ?peripheral arterial disease, status post left BKA that presents to the clinic for a left BKA amputation site wound. 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 The wound site became infected and he underwent an IandD on ?05/07/2021. The wound was left open at that time. He has been using wet-to-dry dressings for the past 3 weeks. He currently denies Systemic ?signs of infection. ?1/25; patient presents for follow-up. He has been using Dakin's wet-to-dry dressings with improvement in wound healing. He has no issues or ?complaints today. He denies signs of infection. ?2/8; Patient presents for follow-up. He continues To use Dakin's wet-to-dry dressings. He has no issues or complaints today. He denies signs  of ?infection. He follows up with his surgeon next week. ?2/22; patient presents for follow-up. He continues to use Dakin's wet-to-dry dressings. He denies signs of infection. He saw his vascular surgeon ?on 06/24/2021. They took out the staples. Nothing further to report on. ?3/8; patient presents for follow-up. He has been using Dakin's wet-to-dry dressings without issues. He denies signs of infection. ?3/22; patient presents for follow-up. She has been using the wound vac  without any issues. She states that in the past 2 days she has had more ?tenderness to the wound bed. She denies signs of infection. ?4/5; patient presents for follow-up. He has been using the wound VAC without issues. He has noticed improvement in wound healing. ?4/19; patient presents for follow-up. He continues to use the wound VAC without issues. ?Electronic Signature(s) ?Signed: 08/24/2021 4:30:07 PM By: Kalman Shan DO ?Entered By: Kalman Shan on 08/24/2021 16:07:48 ?[name], [name] ([id number]) ?-------------------------------------------------------------------------------- ?Physical Exam Details ?Patient Name: [name], [name] ?Date of Service: 08/24/2021 2:15 PM ?Medical Record Number: [id number] ?Patient Account Number: [id number] ?Date of Birth/Sex: [birth year] (61 y.o. M) ?Treating RN: Donnamarie Poag ?Primary Care Provider: Derinda Late Other Clinician: ?Referring Provider: Derinda Late ?Treating Provider/Extender: Kalman Shan ?Weeks in Treatment: 13 ?Constitutional ?. ?Psychiatric ?Marland Kitchen ?Notes ?Left BKA with wound at the amputation site with mostly granulation tissue and some slough. There is underming from the 11-3 o'clock position. ?No signs of surrounding infection. Overall appears well-healing. ?Electronic Signature(s) ?Signed: 08/24/2021 4:30:07 PM By: Kalman Shan DO ?Entered By: Kalman Shan on 08/24/2021 16:10:59 ?[name], [name]  ([id number]) ?-------------------------------------------------------------------------------- ?Physician Orders Details ?Patient Name: [name], [name] ?Date of Service: 08/24/2021 2:15 PM ?Medical Record Number: [id number] ?Patient Account Number: [id number] ?Date of Birth/Sex: 11/21/1960 (61 y.o. M) ?Treating RN: Donnamarie Poag ?Primary Care Provider: Derinda Late Other Clinician: ?Referring Provider: Derinda Late ?Treating Provider/Extender: Kalman Shan ?Weeks in Treatment: 13 ?Verbal / Phone Orders: No ?Diagnosis Coding ?Follow-up Appointments ?o Return Appointment in 2 weeks. - at request ?Home Health ?[place] ?o [place] for wound care. May utilize formulary equivalent dressing for wound treatment orders unless ?otherwise specified. Home Health Nurse may visit [facility] to address patientos wound care needs. - CONTACT [facility] IF ANY ?PROBLEMS OR QUESTIONS OR CONCERNS OF USE ?o Scheduled days for dressing changes to be completed; exception, patient has scheduled wound care visit that day. ?o **Please direct any NON-WOUND related issues/requests for orders to patient's Primary Care Physician. **If current ?dressing causes regression in wound condition, may D/C ordered dressing product/s and apply Normal Saline Moist ?Dressing daily until next [place] or Other MD appointment. **Notify [facility] of regression in ?wound condition at [phone]. ?Bathing/ Shower/ Hygiene ?o No tub bath. ?Anesthetic (Use 'Patient Medications' Section for Anesthetic Order Entry) ?o Lidocaine applied to wound bed ?Edema Control - Lymphedema / Segmental Compressive Device / Other ?o DO YOUR BEST to sleep in the bed at night. DO NOT sleep in your recliner. Long hours of sitting in a recliner leads to ?swelling of the legs and/or potential wounds on your backside. ?Additional Orders / Instructions ?o Follow Nutritious Diet and Increase Protein Intake - monitor blood  sugar to promote wound healing ?o Other: - Keep appointment with surgeon and PM and R after healed for prostheis as they had scheduled ?Negative Pressure Wound Therapy ?o Wound VAC settings at 167mHg continuous pressure. Use foam to wound cavity. Please order WHITE foam to fill any

## 2021-08-29 DIAGNOSIS — E1165 Type 2 diabetes mellitus with hyperglycemia: Secondary | ICD-10-CM | POA: Diagnosis not present

## 2021-08-29 DIAGNOSIS — Z794 Long term (current) use of insulin: Secondary | ICD-10-CM | POA: Diagnosis not present

## 2021-08-29 DIAGNOSIS — F332 Major depressive disorder, recurrent severe without psychotic features: Secondary | ICD-10-CM | POA: Diagnosis not present

## 2021-08-29 DIAGNOSIS — D6869 Other thrombophilia: Secondary | ICD-10-CM | POA: Diagnosis not present

## 2021-08-29 DIAGNOSIS — Z125 Encounter for screening for malignant neoplasm of prostate: Secondary | ICD-10-CM | POA: Diagnosis not present

## 2021-08-29 DIAGNOSIS — E1159 Type 2 diabetes mellitus with other circulatory complications: Secondary | ICD-10-CM | POA: Diagnosis not present

## 2021-08-29 DIAGNOSIS — I70202 Unspecified atherosclerosis of native arteries of extremities, left leg: Secondary | ICD-10-CM | POA: Diagnosis not present

## 2021-08-29 DIAGNOSIS — Z8739 Personal history of other diseases of the musculoskeletal system and connective tissue: Secondary | ICD-10-CM | POA: Diagnosis not present

## 2021-08-29 DIAGNOSIS — E1151 Type 2 diabetes mellitus with diabetic peripheral angiopathy without gangrene: Secondary | ICD-10-CM | POA: Diagnosis not present

## 2021-08-29 DIAGNOSIS — Z79899 Other long term (current) drug therapy: Secondary | ICD-10-CM | POA: Diagnosis not present

## 2021-08-29 DIAGNOSIS — Z86718 Personal history of other venous thrombosis and embolism: Secondary | ICD-10-CM | POA: Diagnosis not present

## 2021-08-29 DIAGNOSIS — Z89512 Acquired absence of left leg below knee: Secondary | ICD-10-CM | POA: Diagnosis not present

## 2021-08-29 DIAGNOSIS — Z930 Tracheostomy status: Secondary | ICD-10-CM | POA: Diagnosis not present

## 2021-08-29 DIAGNOSIS — E7800 Pure hypercholesterolemia, unspecified: Secondary | ICD-10-CM | POA: Diagnosis not present

## 2021-08-29 DIAGNOSIS — I10 Essential (primary) hypertension: Secondary | ICD-10-CM | POA: Diagnosis not present

## 2021-08-30 ENCOUNTER — Ambulatory Visit (INDEPENDENT_AMBULATORY_CARE_PROVIDER_SITE_OTHER): Payer: HMO | Admitting: Psychiatry

## 2021-08-30 ENCOUNTER — Other Ambulatory Visit: Payer: Self-pay | Admitting: Psychiatry

## 2021-08-30 ENCOUNTER — Encounter: Payer: Self-pay | Admitting: Psychiatry

## 2021-08-30 VITALS — BP 136/82 | HR 65 | Temp 92.2°F | Ht 71.0 in

## 2021-08-30 DIAGNOSIS — F5101 Primary insomnia: Secondary | ICD-10-CM | POA: Diagnosis not present

## 2021-08-30 DIAGNOSIS — Z634 Disappearance and death of family member: Secondary | ICD-10-CM

## 2021-08-30 DIAGNOSIS — F331 Major depressive disorder, recurrent, moderate: Secondary | ICD-10-CM

## 2021-08-30 DIAGNOSIS — T50905A Adverse effect of unspecified drugs, medicaments and biological substances, initial encounter: Secondary | ICD-10-CM

## 2021-08-30 DIAGNOSIS — R635 Abnormal weight gain: Secondary | ICD-10-CM

## 2021-08-30 DIAGNOSIS — F3342 Major depressive disorder, recurrent, in full remission: Secondary | ICD-10-CM | POA: Diagnosis not present

## 2021-08-30 DIAGNOSIS — F411 Generalized anxiety disorder: Secondary | ICD-10-CM

## 2021-08-30 MED ORDER — OLANZAPINE 7.5 MG PO TABS: ORAL_TABLET | ORAL | 0 refills | Status: DC

## 2021-08-30 MED ORDER — BUPROPION HCL ER (XL) 300 MG PO TB24: 300.0000 mg | ORAL_TABLET | Freq: Every day | ORAL | 0 refills | Status: DC

## 2021-08-30 MED ORDER — CLONAZEPAM 1 MG PO TABS: 1.0000 mg | ORAL_TABLET | Freq: Two times a day (BID) | ORAL | 1 refills | Status: DC | PRN

## 2021-08-30 MED ORDER — VENLAFAXINE HCL ER 150 MG PO CP24: 150.0000 mg | ORAL_CAPSULE | Freq: Every day | ORAL | 0 refills | Status: DC

## 2021-08-30 NOTE — Progress Notes (Signed)
[facility] OP Progress Note ? ?08/30/2021 3:49 PM ?[name]  ?MRN:  [id number] ? ?Chief Complaint:  ?Chief Complaint  ?Patient presents with  ? Follow-up : 61 year old Caucasian male, married, has a history of MDD, GAD, insomnia, multiple medical problems including history of tracheal stenosis status post multiple reintubation, recent below-knee amputation, recent loss of his wife, presented for medication management.  ? ?HPI: [name] is a 61 year old Caucasian male, married, lives in [place], has a history of MDD, GAD, insomnia, history of tracheal stenosis status post multiple reintubation, respiratory failure due to opioid overdose, history of CVA, right-sided hemiparesis, diabetes mellitus, hypertension, NSTEMI, hypothyroidism, chronic pain was evaluated in office today. ? ?Patient reports he recently lost his wife, she had medical problems.  Patient reports it has been hard however he has been coping okay.  He does have good support system from his family as well as friends.  He also has been talking to his therapist and that has helped.  Patient reports he has been praying a lot about it and that has definitely helped. ? ?Patient reports overall mood symptoms as okay.  Current medications are beneficial. ? ?Reports sleep is fairly good. ? ?Reports he continues to use the Klonopin twice a day and that helps.  Would like to stay on this regimen for now.  Denies side effects. ? 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  Currently under the care of his providers, wound care, reports once it heals he will be able to get fitted for prosthetics.  He looks forward to that.  Patient reports he however has been doing a lot of activities around the house, cooking as well as cleaning as much as he is able to. ? ?Patient appeared to be alert, oriented to person place time situation. ? ?Denied any suicidality, homicidality or perceptual disturbances. ? ?Patient with rash on left side of his neck and  lower extremities ,agrees to follow up with primary care provider.ongoing since past few days. ? ?Patient denies any other concerns today. ? ?Visit Diagnosis:  ?  ICD-10-CM   ?1. MDD (major depressive disorder), recurrent, in full remission ([place])  F33.42   ?  ?2. GAD (generalized anxiety disorder)  F41.1 clonazePAM (KLONOPIN) 1 MG tablet  ?  ?3. Primary insomnia  F51.01   ?  ?4. Weight gain due to medication  R63.5   ? T50.905A   ? Psychotropics  ?  ?5. Bereavement  Z63.4 venlafaxine XR (EFFEXOR-XR) 150 MG 24 hr capsule  ?  OLANZapine (ZYPREXA) 7.5 MG tablet  ?  buPROPion (WELLBUTRIN XL) 300 MG 24 hr tablet  ?  ? ? ?Past Psychiatric History: Reviewed past psychiatric history from progress note on 01/15/2019.  Past trials of fluoxetine, Rexulti, Xanax, Abilify.  Completed TMS-04/05/2020.  Patient with [place] sessions in the past prior to that.  Patient with history of ECT-did not tolerate it.  Patient with multiple inpatient mental health admissions in the past.  Last one was in 2020.  Possible remote history of suicide attempt in 2014-however reports it was accidental and he did not intend to kill himself. ? ?Past Medical History:  ?Past Medical History:  ?Diagnosis Date  ? Depression   ? Diabetes ([place])   ? Insulin Pump  ? Diabetes mellitus type I ([place])   ? Diabetes mellitus without complication ([place])   ? GERD (gastroesophageal reflux disease)   ? H/O laryngectomy   ? Heel bone fracture   ? Hyperlipidemia   ?  Hypertension   ? Radicular pain of right lower extremity   ? Stroke [facility])   ? Suicide attempt [facility]) 2014  ? damaged larynx - tracheostomy  ? Thyroid disease   ?  ?Past Surgical History:  ?Procedure Laterality Date  ? COLONOSCOPY WITH PROPOFOL N/A 05/15/2018  ? Procedure: COLONOSCOPY WITH PROPOFOL;  Surgeon: [name], [name], MD;  Location: [facility] ENDOSCOPY;  Service: Gastroenterology;  Laterality: N/A;  ? ESOPHAGOGASTRODUODENOSCOPY N/A 05/15/2018  ? Procedure: ESOPHAGOGASTRODUODENOSCOPY (EGD);  Surgeon: [name], [name], MD;  Location: [facility] ENDOSCOPY;  Service: Gastroenterology;  Laterality: N/A;  ? FRACTURE SURGERY    ? Heel bone reconstruction Left   ? HERNIA REPAIR  02/2011  ? Umbilical hernia repair   ? LARYNGECTOMY    ? LOWER EXTREMITY ANGIOGRAPHY Left 01/31/2021  ? Procedure: LOWER EXTREMITY ANGIOGRAPHY;  Surgeon: Algernon Huxley, MD;  Location: [place] CV LAB;  Service: Cardiovascular;  Laterality: Left;  ? LOWER EXTREMITY ANGIOGRAPHY Right 02/14/2021  ? Procedure: LOWER EXTREMITY ANGIOGRAPHY;  Surgeon: Algernon Huxley, MD;  Location: [place] [facility] LAB;  Service: Cardiovascular;  Laterality: Right;  ? NECK SURGERY    ? fusion  ? SPINE SURGERY    ? TRACHEOSTOMY  2014  ? from SI attempt  ? ? ?Family Psychiatric History: Reviewed family psychiatric history from progress note on 01/15/2019. ? ?Family History:  ?Family History  ?Problem Relation Age of Onset  ? Osteoporosis Mother   ? Diabetes Mother   ? Hypertension Father   ? Mental illness Neg Hx   ? ? ?Social History: Reviewed social history from progress note on 01/15/2019. ?Social History  ? ?Socioeconomic History  ? Marital status: Married  ?  Spouse name: [name]  ? Number of children: Not on file  ? Years of education: Not on file  ? Highest education level: Not on file  ?Occupational History  ? Not on file  ?Tobacco Use  ? Smoking status: Former  ?  Packs/day: 0.00  ?  Types: Cigarettes  ?  Quit date: 04/09/2013  ?  Years since quitting: 8.4  ? Smokeless tobacco: Never  ?Vaping Use  ? Vaping Use: Never used  ?Substance and Sexual Activity  ? Alcohol use: Yes  ?  Alcohol/week: 2.0 standard drinks  ?  Types: 2 Shots of liquor per week  ?  Comment: rare  ? Drug use: No  ?  Comment: Pt denied; UDS not available  ? Sexual activity: Yes  ?  Partners: Female  ?  Birth control/protection: Condom  ?Other Topics Concern  ? Not on file  ?Social History Narrative  ? Not on file  ? ?Social Determinants of Health  ? ?Financial Resource Strain: Not on file  ?Food Insecurity: Not on file   ?Transportation Needs: Not on file  ?Physical Activity: Not on file  ?Stress: Not on file  ?Social Connections: Not on file  ? ? ?Allergies:  ?Allergies  ?Allergen Reactions  ? Buspar [Buspirone]   ?  Makes the patient "flip out"  ? Depakote [Valproic Acid]   ?  Causes excessive drowsiness  ? Clopidogrel Rash  ? Gabapentin Itching and Rash  ? ? ?Metabolic Disorder Labs: ?Lab Results  ?Component Value Date  ? HGBA1C 7.8 (H) 02/14/2021  ? MPG 177 02/14/2021  ? MPG 177.16 01/01/2019  ? ?No results found for: PROLACTIN ?Lab Results  ?Component Value Date  ? CHOL 161 09/15/2016  ? TRIG 190 (H) 09/15/2016  ? HDL 41 09/15/2016  ? CHOLHDL  3.9 09/15/2016  ? VLDL 38 09/15/2016  ? [place] 82 09/15/2016  ? ?Lab Results  ?Component Value Date  ? TSH 1.952 09/15/2016  ? ? ?Therapeutic Level Labs: ?No results found for: LITHIUM ?Lab Results  ?Component Value Date  ? VALPROATE 19 (L) 09/12/2016  ? ?No components found for:  CBMZ ? ?Current Medications: ?Current Outpatient Medications  ?Medication Sig Dispense Refill  ? amLODipine (NORVASC) 5 MG tablet Take 1 tablet (5 mg total) by mouth daily. 30 tablet 1  ? apixaban (ELIQUIS) 5 MG TABS tablet Take 1 tablet (5 mg total) by mouth 2 (two) times daily. 60 tablet 5  ? buPROPion (WELLBUTRIN XL) 300 MG 24 hr tablet Take 1 tablet (300 mg total) by mouth daily with breakfast. Next refill please 90 tablet 0  ? carvedilol (COREG) 25 MG tablet Take 1 tablet by mouth 2 (two) times daily.    ? clonazePAM (KLONOPIN) 1 MG tablet Take 1 tablet (1 mg total) by mouth 2 (two) times daily as needed for anxiety. 60 tablet 1  ? empagliflozin (JARDIANCE) 25 MG TABS tablet Take 25 mg by mouth daily. 30 tablet 1  ? insulin aspart (NOVOLOG) 100 UNIT/ML injection INJECT UP TO 120 UNITS UNDER THE SKIN AS DIRECTED DAILY VIA INSULIN PUMP    ? levothyroxine (SYNTHROID) 75 MCG tablet TAKE 1 TABLET BY MOUTH DAILY AT 6 AM 90 tablet 0  ? OLANZapine (ZYPREXA) 7.5 MG tablet TAKE 1 TABLET(7.5 MG) BY MOUTH AT BEDTIME  90 tablet 0  ? oxyCODONE-acetaminophen (PERCOCET) 10-325 MG tablet Take 1 tablet by mouth every 8 (eight) hours as needed for pain. (Patient not taking: Reported on 07/05/2021) 30 tablet 0  ? pantoprazole

## 2021-08-31 ENCOUNTER — Encounter: Payer: HMO | Admitting: Internal Medicine

## 2021-09-02 DIAGNOSIS — Z430 Encounter for attention to tracheostomy: Secondary | ICD-10-CM | POA: Diagnosis not present

## 2021-09-03 DIAGNOSIS — Z89512 Acquired absence of left leg below knee: Secondary | ICD-10-CM | POA: Diagnosis not present

## 2021-09-05 ENCOUNTER — Other Ambulatory Visit: Payer: Self-pay | Admitting: Psychiatry

## 2021-09-05 DIAGNOSIS — E119 Type 2 diabetes mellitus without complications: Secondary | ICD-10-CM | POA: Diagnosis not present

## 2021-09-05 DIAGNOSIS — F32A Depression, unspecified: Secondary | ICD-10-CM | POA: Diagnosis not present

## 2021-09-05 DIAGNOSIS — N1832 Chronic kidney disease, stage 3b: Secondary | ICD-10-CM | POA: Diagnosis not present

## 2021-09-05 DIAGNOSIS — T8189XA Other complications of procedures, not elsewhere classified, initial encounter: Secondary | ICD-10-CM | POA: Diagnosis not present

## 2021-09-05 DIAGNOSIS — Z0000 Encounter for general adult medical examination without abnormal findings: Secondary | ICD-10-CM | POA: Diagnosis not present

## 2021-09-05 DIAGNOSIS — Z634 Disappearance and death of family member: Secondary | ICD-10-CM

## 2021-09-05 DIAGNOSIS — Z7984 Long term (current) use of oral hypoglycemic drugs: Secondary | ICD-10-CM | POA: Diagnosis not present

## 2021-09-05 DIAGNOSIS — F331 Major depressive disorder, recurrent, moderate: Secondary | ICD-10-CM | POA: Diagnosis not present

## 2021-09-05 DIAGNOSIS — Z794 Long term (current) use of insulin: Secondary | ICD-10-CM | POA: Diagnosis not present

## 2021-09-05 DIAGNOSIS — Z1331 Encounter for screening for depression: Secondary | ICD-10-CM | POA: Diagnosis not present

## 2021-09-05 DIAGNOSIS — Z930 Tracheostomy status: Secondary | ICD-10-CM | POA: Diagnosis not present

## 2021-09-05 DIAGNOSIS — Z89512 Acquired absence of left leg below knee: Secondary | ICD-10-CM | POA: Diagnosis not present

## 2021-09-05 DIAGNOSIS — Z79899 Other long term (current) drug therapy: Secondary | ICD-10-CM | POA: Diagnosis not present

## 2021-09-05 DIAGNOSIS — E7800 Pure hypercholesterolemia, unspecified: Secondary | ICD-10-CM | POA: Diagnosis not present

## 2021-09-05 DIAGNOSIS — F419 Anxiety disorder, unspecified: Secondary | ICD-10-CM | POA: Diagnosis not present

## 2021-09-05 DIAGNOSIS — I10 Essential (primary) hypertension: Secondary | ICD-10-CM | POA: Diagnosis not present

## 2021-09-05 DIAGNOSIS — I739 Peripheral vascular disease, unspecified: Secondary | ICD-10-CM | POA: Diagnosis not present

## 2021-09-05 DIAGNOSIS — Z4781 Encounter for orthopedic aftercare following surgical amputation: Secondary | ICD-10-CM | POA: Diagnosis not present

## 2021-09-05 DIAGNOSIS — E1022 Type 1 diabetes mellitus with diabetic chronic kidney disease: Secondary | ICD-10-CM | POA: Diagnosis not present

## 2021-09-05 DIAGNOSIS — T148XXD Other injury of unspecified body region, subsequent encounter: Secondary | ICD-10-CM | POA: Diagnosis not present

## 2021-09-07 ENCOUNTER — Encounter: Payer: HMO | Attending: Internal Medicine | Admitting: Internal Medicine

## 2021-09-07 DIAGNOSIS — Z89512 Acquired absence of left leg below knee: Secondary | ICD-10-CM | POA: Insufficient documentation

## 2021-09-07 DIAGNOSIS — L97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: Secondary | ICD-10-CM

## 2021-09-07 DIAGNOSIS — E1051 Type 1 diabetes mellitus with diabetic peripheral angiopathy without gangrene: Secondary | ICD-10-CM | POA: Diagnosis not present

## 2021-09-07 DIAGNOSIS — Y835 Amputation of limb(s) as the cause of abnormal reaction of the patient, or of later complication, without mention of misadventure at the time of the procedure: Secondary | ICD-10-CM | POA: Diagnosis not present

## 2021-09-07 DIAGNOSIS — E10622 Type 1 diabetes mellitus with other skin ulcer: Secondary | ICD-10-CM | POA: Diagnosis not present

## 2021-09-07 DIAGNOSIS — T8781 Dehiscence of amputation stump: Secondary | ICD-10-CM | POA: Diagnosis not present

## 2021-09-07 NOTE — Progress Notes (Signed)
SARON, TWEED ([id number]) ?Visit Report for 09/07/2021 ?Arrival Information Details ?Patient Name: [name], [name] ?Date of Service: 09/07/2021 2:30 PM ?Medical Record Number: [id number] ?Patient Account Number: [id number] ?Date of Birth/Sex: [birth year] (61 y.o. M) ?Treating RN: Donnamarie Poag ?Primary Care : Derinda Late Other Clinician: ?Referring : Derinda Late ?Treating /Extender: Kalman Shan ?Weeks in Treatment: 15 ?Visit Information History Since Last Visit ?Added or deleted any medications: No ?Patient Arrived: Wheel Chair ?Had a fall or experienced change in No ?Arrival Time: 14:40 ?activities of daily living that may affect ?Accompanied By: father ?risk of falls: ?Transfer Assistance: EasyPivot Patient Lift ?Hospitalized since last visit: No ?Patient Identification Verified: Yes ?Has Dressing in Place as Prescribed: Yes ?Secondary Verification Process Completed: Yes ?Pain Present Now: No ?Patient Requires Transmission-Based No ?Precautions: ?Patient Has Alerts: Yes ?Patient Alerts: Patient on Blood ?Thinner ?Eliquis ?Diabetic ?Electronic Signature(s) ?Signed: 09/07/2021 3:40:38 PM By: Donnamarie Poag ?Entered ByDonnamarie Poag on 09/07/2021 14:43:21 ?[name], [name] ([id number]) ?-------------------------------------------------------------------------------- ?Encounter Discharge Information Details ?Patient Name: [name], [name] ?Date of Service: 09/07/2021 2:30 PM ?Medical Record Number: [id number] ?Patient Account Number: [id number] ?Date of Birth/Sex: 24-Aug-1960 (61 y.o. M) ?Treating RN: Donnamarie Poag ?Primary Care : Derinda Late Other Clinician: ?Referring : Derinda Late ?Treating /Extender: Kalman Shan ?Weeks in Treatment: 15 ?Encounter Discharge Information Items Post Procedure Vitals ?Discharge Condition: Stable ?Temperature (?F): 98.3 ?Ambulatory Status: Wheelchair ?Pulse (bpm): 75 ?Discharge Destination: Home Health ?Respiratory Rate (breaths/min):  16 ?Telephoned: No ?Blood Pressure (mmHg): 152/83 ?Orders Sent: Yes ?Transportation: Private Auto ?Accompanied By: father ?Schedule Follow-up Appointment: Yes ?Clinical Summary of Care: ?Electronic Signature(s) ?Signed: 09/07/2021 3:40:38 PM By: Donnamarie Poag ?Entered ByDonnamarie Poag on 09/07/2021 15:11:31 ?[name], [name] ([id number]) ?-------------------------------------------------------------------------------- ?Lower Extremity Assessment Details ?Patient Name: [name], [name] ?Date of Service: 09/07/2021 2:30 PM ?Medical Record Number: [id number] ?Patient Account Number: [id number] ?Date of Birth/Sex: [birth year] (61 y.o. M) ?Treating RN: Donnamarie Poag ?Primary Care : Derinda Late Other Clinician: ?Referring : Derinda Late ?Treating /Extender: Kalman Shan ?Weeks in Treatment: 15 ?Edema Assessment ?Assessed: [Left: Yes] [Right: No] ?Edema: [Left: N] [Right: o] ?Notes ?L BKA ?Electronic Signature(s) ?Signed: 09/07/2021 3:40:38 PM By: Donnamarie Poag ?Entered ByDonnamarie Poag on 09/07/2021 14:49:37 ?[name], [name] ([id number]) ?-------------------------------------------------------------------------------- ?Multi Wound Chart Details ?Patient Name: [name], [name] ?Date of Service: 09/07/2021 2:30 PM ?Medical Record Number: [id number] ?Patient Account Number: [id number] ?Date of Birth/Sex: 1960/10/13 (60 y.o. M) ?Treating RN: Donnamarie Poag ?Primary Care : Derinda Late Other Clinician: ?Referring : Derinda Late ?Treating /Extender: Kalman Shan ?Weeks in Treatment: 15 ?Vital Signs ?Height(in): 71 ?Pulse(bpm): 75 ?Weight(lbs): 250 ?Blood Pressure(mmHg): 152/83 ?Body Mass Index(BMI): 34.9 ?Temperature(??F): 98.5 ?Respiratory Rate(breaths/min): 16 ?Photos: [N/A:N/A] ?Wound Location: Left Amputation Site - Below Knee N/A N/A ?Wounding Event: Surgical Injury N/A N/A ?Primary Etiology: Open Surgical Wound N/A N/A ?Secondary Etiology: Diabetic Wound/Ulcer of the Lower N/A  N/A ?Extremity ?Comorbid History: Coronary Artery Disease, N/A N/A ?Hypertension, Type I Diabetes ?Date Acquired: 04/12/2021 N/A N/A ?Weeks of Treatment: 15 N/A N/A ?Wound Status: Open N/A N/A ?Wound Recurrence: No N/A N/A ?Pending Amputation on Yes N/A N/A ?Presentation: ?Measurements L x W x D (cm) 1.9x4x0.4 N/A N/A ?Area (cm?) : 5.969 N/A N/A ?Volume (cm?) : 2.388 N/A N/A ?% Reduction in Area: 91.60% N/A N/A ?% Reduction in Volume: 98.80% N/A N/A ?Starting Position 1 (o'clock): 11 ?Ending Position 1 (o'clock): 12 ?Maximum Distance 1 (cm): 0.4 ?Undermining: Yes N/A N/A ?Classification: Full Thickness With Exposed N/A N/A ?Support Structures ?Exudate Amount: Large N/A N/A ?Exudate  Type: Serosanguineous N/A N/A ?Exudate Color: red, brown N/A N/A ?Granulation Amount: Large (67-100%) N/A N/A ?Granulation Quality: Red, Hyper-granulation N/A N/A ?Necrotic Amount: Small (1-33%) N/A N/A ?Exposed Structures: ?Fat Layer (Subcutaneous Tissue): N/A N/A ?Yes ?Bone: Yes ?Fascia: No ?Tendon: No ?Muscle: No ?Joint: No ?Epithelialization: None N/A N/A ?Treatment Notes ?[name], [name] ([id number]) ?Electronic Signature(s) ?Signed: 09/07/2021 3:40:38 PM By: Donnamarie Poag ?Entered ByDonnamarie Poag on 09/07/2021 14:50:04 ?[name], [name] ([id number]) ?-------------------------------------------------------------------------------- ?Multi-Disciplinary Care Plan Details ?Patient Name: [name], [name] ?Date of Service: 09/07/2021 2:30 PM ?Medical Record Number: [id number] ?Patient Account Number: [id number] ?Date of Birth/Sex: 06-Aug-1960 (61 y.o. M) ?Treating RN: Donnamarie Poag ?Primary Care : Derinda Late Other Clinician: ?Referring : Derinda Late ?Treating /Extender: Kalman Shan ?Weeks in Treatment: 15 ?Active Inactive ?Wound/Skin Impairment ?Nursing Diagnoses: ?Impaired tissue integrity ?Knowledge deficit related to smoking impact on wound healing ?Knowledge deficit related to ulceration/compromised skin  integrity ?Goals: ?Patient/caregiver will verbalize understanding of skin care regimen ?Date Initiated: 05/25/2021 ?Date Inactivated: 06/15/2021 ?Target Resolution Date: 07/04/2021 ?Goal Status: Met ?Ulcer/skin breakdown will have a volume reduction of 30% by week 4 ?Date Initiated: 05/25/2021 ?Date Inactivated: 06/29/2021 ?Target Resolution Date: 06/22/2021 ?Goal Status: Met ?Ulcer/skin breakdown will have a volume reduction of 50% by week 8 ?Date Initiated: 05/25/2021 ?Date Inactivated: 07/13/2021 ?Target Resolution Date: 07/20/2021 ?Goal Status: Met ?Ulcer/skin breakdown will have a volume reduction of 80% by week 12 ?Date Initiated: 05/25/2021 ?Target Resolution Date: 08/10/2021 ?Goal Status: Active ?Ulcer/skin breakdown will heal within 14 weeks ?Date Initiated: 05/25/2021 ?Target Resolution Date: 09/07/2021 ?Goal Status: Active ?Interventions: ?Assess patient/caregiver ability to obtain necessary supplies ?Assess patient/caregiver ability to perform ulcer/skin care regimen upon admission and as needed ?Assess ulceration(s) every visit ?Notes: ?Electronic Signature(s) ?Signed: 09/07/2021 3:40:38 PM By: Donnamarie Poag ?Entered ByDonnamarie Poag on 09/07/2021 14:49:46 ?[name], DUFFEY ([id number]) ?-------------------------------------------------------------------------------- ?Pain Assessment Details ?Patient Name: [name], [name] ?Date of Service: 09/07/2021 2:30 PM ?Medical Record Number: [id number] ?Patient Account Number: [id number] ?Date of Birth/Sex: [birth year] (61 y.o. M) ?Treating RN: Donnamarie Poag ?Primary Care : Derinda Late Other Clinician: ?Referring : Derinda Late ?Treating /Extender: Kalman Shan ?Weeks in Treatment: 15 ?Active Problems ?Location of Pain Severity and Description of Pain ?Patient Has Paino No ?Site Locations ?Rate the pain. ?Current Pain Level: 0 ?Pain Management and Medication ?Current Pain Management: ?Electronic Signature(s) ?Signed: 09/07/2021 3:40:38 PM By: Donnamarie Poag ?Entered ByDonnamarie Poag on 09/07/2021 14:43:54 ?WILLIAMSON, CAVANAH ([id number]) ?-------------------------------------------------------------------------------- ?Patient/Caregiver Education Details ?Patient Name: CORLEONE, BIEGLER.

## 2021-09-07 NOTE — Progress Notes (Signed)
TION, TSE ([id number]) ?Visit Report for 09/07/2021 ?Chief Complaint Document Details ?Patient Name: Joel, Holder ?Date of Service: 09/07/2021 2:30 PM ?Medical Record Number: [id number] ?Patient Account Number: [id number] ?Date of Birth/Sex: 1960/07/01 (61 y.o. M) ?Treating RN: Donnamarie Poag ?Primary Care Provider: Derinda Late Other Clinician: ?Referring Provider: Derinda Late ?Treating Provider/Extender: Kalman Shan ?Weeks in Treatment: 15 ?Information Obtained from: Patient ?Chief Complaint ?Left lower extremity amputation site wound ?Electronic Signature(s) ?Signed: 09/07/2021 3:24:58 PM By: Kalman Shan DO ?Entered By: Kalman Shan on 09/07/2021 15:21:42 ?[name], [name] ([id number]) ?-------------------------------------------------------------------------------- ?Debridement Details ?Patient Name: [name], [name] ?Date of Service: 09/07/2021 2:30 PM ?Medical Record Number: [id number] ?Patient Account Number: [id number] ?Date of Birth/Sex: [birth year] (61 y.o. M) ?Treating RN: Donnamarie Poag ?Primary Care Provider: Derinda Late Other Clinician: ?Referring Provider: Derinda Late ?Treating Provider/Extender: Kalman Shan ?Weeks in Treatment: 15 ?Debridement Performed for ?Wound #1 Left Amputation Site - Below Knee ?Assessment: ?Performed By: Physician Kalman Shan, MD ?Debridement Type: Debridement ?Severity of Tissue Pre Debridement: Fat layer exposed ?Level of Consciousness (Pre- ?Awake and Alert ?procedure): ?Pre-procedure Verification/Time Out ?Yes - 15:08 ?Taken: ?Start Time: 15:09 ?Pain Control: Lidocaine ?Total Area Debrided (L x W): 1.9 (cm) x 4 (cm) = 7.6 (cm?) ?Tissue and other material ?Viable, Non-Viable, Slough, Subcutaneous, Limestone ?debrided: ?Level: Skin/Subcutaneous Tissue ?Debridement Description: Excisional ?Instrument: Curette ?Bleeding: Minimum ?Hemostasis Achieved: Pressure ?Response to Treatment: Procedure was tolerated well ?Level of Consciousness (Post- ?Awake and  Alert ?procedure): ?Post Debridement Measurements of Total Wound ?Length: (cm) 1.9 ?Width: (cm) 4 ?Depth: (cm) 0.4 ?Volume: (cm?) 2.388 ?Character of Wound/Ulcer Post Debridement: Improved ?Severity of Tissue Post Debridement: Fat layer exposed ?Post Procedure Diagnosis ?Same as Pre-procedure ?Electronic Signature(s) ?Signed: 09/07/2021 3:24:58 PM By: Kalman Shan DO ?Signed: 09/07/2021 3:40:38 PM By: Donnamarie Poag ?Entered ByDonnamarie Poag on 09/07/2021 15:09:54 ?[name], [name] ([id number]) ?-------------------------------------------------------------------------------- ?HPI Details ?Patient Name: [name], [name] ?Date of Service: 09/07/2021 2:30 PM ?Medical Record Number: [id number] ?Patient Account Number: [id number] ?Date of Birth/Sex: [birth year] (61 y.o. M) ?Treating RN: Donnamarie Poag ?Primary Care Provider: Derinda Late Other Clinician: ?Referring Provider: Derinda Late ?Treating Provider/Extender: Kalman Shan ?Weeks in Treatment: 15 ?History of Present Illness ?HPI Description: Admission 05/25/2021 ?[name] is a 61 year old male with a past medical history of uncontrolled type 1 diabetes on insulin with last hemoglobin A1c of 8.8, ?peripheral arterial disease, status post left BKA that presents to the clinic for a left BKA amputation site wound. 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 The wound site became infected and he underwent an IandD on ?05/07/2021. The wound was left open at that time. He has been using wet-to-dry dressings for the past 3 weeks. He currently denies Systemic ?signs of infection. ?1/25; patient presents for follow-up. He has been using Dakin's wet-to-dry dressings with improvement in wound healing. He has no issues or ?complaints today. He denies signs of infection. ?2/8; Patient presents for follow-up. He continues To use Dakin's wet-to-dry dressings. He has no issues or complaints today. He denies signs  of ?infection. He follows up with his surgeon next week. ?2/22; patient presents for follow-up. He continues to use Dakin's wet-to-dry dressings. He denies signs of infection. He saw his vascular surgeon ?on 06/24/2021. They took out the staples. Nothing further to report on. ?3/8; patient presents for follow-up. He has been using Dakin's wet-to-dry dressings without issues. He denies signs of infection. ?3/22; patient presents for follow-up. She has been using the wound vac  without any issues. She states that in the past 2 days she has had more ?tenderness to the wound bed. She denies signs of infection. ?4/5; patient presents for follow-up. He has been using the wound VAC without issues. He has noticed improvement in wound healing. ?4/19; patient presents for follow-up. He continues to use the wound VAC without issues. ?5/3; patient presents for follow-up. He continues to use the wound VAC without issues. He has no complaints today. ?Electronic Signature(s) ?Signed: 09/07/2021 3:24:58 PM By: Kalman Shan DO ?Entered By: Kalman Shan on 09/07/2021 15:22:54 ?[name], [name] ([id number]) ?-------------------------------------------------------------------------------- ?Physical Exam Details ?Patient Name: [name], [name] ?Date of Service: 09/07/2021 2:30 PM ?Medical Record Number: [id number] ?Patient Account Number: [id number] ?Date of Birth/Sex: 17-Aug-1960 (61 y.o. M) ?Treating RN: Donnamarie Poag ?Primary Care Provider: Derinda Late Other Clinician: ?Referring Provider: Derinda Late ?Treating Provider/Extender: Kalman Shan ?Weeks in Treatment: 15 ?Constitutional ?. ?Psychiatric ?Marland Kitchen ?Notes ?Left BKA with wound at the amputation site with mostly granulation tissue and some slough. There is underming from the 11-3 o'clock position. ?No signs of surrounding infection. Overall appears well-healing. ?Electronic Signature(s) ?Signed: 09/07/2021 3:24:58 PM By: Kalman Shan DO ?Entered By: Kalman Shan on  09/07/2021 15:23:18 ?[name], [name] ([id number]) ?-------------------------------------------------------------------------------- ?Physician Orders Details ?Patient Name: [name], [name] ?Date of Service: 09/07/2021 2:30 PM ?Medical Record Number: [id number] ?Patient Account Number: [id number] ?Date of Birth/Sex: [birth year] (60 y.o. M) ?Treating RN: Donnamarie Poag ?Primary Care Provider: Derinda Late Other Clinician: ?Referring Provider: Derinda Late ?Treating Provider/Extender: Kalman Shan ?Weeks in Treatment: 15 ?Verbal / Phone Orders: No ?Diagnosis Coding ?Follow-up Appointments ?o Return Appointment in 2 weeks. - at request ?Home Health ?Crete only foam ok to use with wound vac ?o [place] for wound care. May utilize formulary equivalent dressing for wound treatment orders unless ?otherwise specified. Home Health Nurse may visit PR[facility] to address patientos wound care needs. - CONTACT [facility] IF ANY ?PROBLEMS OR QUESTIONS OR CONCERNS OF USE ?o Scheduled days for dressing changes to be completed; exception, patient has scheduled wound care visit that day. ?o **Please direct any NON-WOUND related issues/requests for orders to patient's Primary Care Physician. **If current ?dressing causes regression in wound condition, may D/C ordered dressing product/s and apply Normal Saline Moist ?Dressing daily until next [place] or Other MD appointment. **Notify [facility] of regression in ?wound condition at [phone]. ?Bathing/ Shower/ Hygiene ?o No tub bath. ?Anesthetic (Use 'Patient Medications' Section for Anesthetic Order Entry) ?o Lidocaine applied to wound bed ?Edema Control - Lymphedema / Segmental Compressive Device / Other ?o DO YOUR BEST to sleep in the bed at night. DO NOT sleep in your recliner. Long hours of sitting in a recliner leads to ?swelling of the legs and/or potential wounds on your backside. ?Additional Orders /  Instructions ?o Follow Nutritious Diet and Increase Protein Intake - monitor blood sugar to promote wound healing ?o Other: - Keep appointment with surgeon and PM and R after healed for prostheis as they had scheduled

## 2021-09-09 DIAGNOSIS — T8189XA Other complications of procedures, not elsewhere classified, initial encounter: Secondary | ICD-10-CM | POA: Diagnosis not present

## 2021-09-13 DIAGNOSIS — Z4781 Encounter for orthopedic aftercare following surgical amputation: Secondary | ICD-10-CM | POA: Diagnosis not present

## 2021-09-15 DIAGNOSIS — E1029 Type 1 diabetes mellitus with other diabetic kidney complication: Secondary | ICD-10-CM | POA: Diagnosis not present

## 2021-09-15 DIAGNOSIS — R809 Proteinuria, unspecified: Secondary | ICD-10-CM | POA: Diagnosis not present

## 2021-09-15 DIAGNOSIS — E103293 Type 1 diabetes mellitus with mild nonproliferative diabetic retinopathy without macular edema, bilateral: Secondary | ICD-10-CM | POA: Diagnosis not present

## 2021-09-15 DIAGNOSIS — H2513 Age-related nuclear cataract, bilateral: Secondary | ICD-10-CM | POA: Diagnosis not present

## 2021-09-17 DIAGNOSIS — T8189XA Other complications of procedures, not elsewhere classified, initial encounter: Secondary | ICD-10-CM | POA: Diagnosis not present

## 2021-09-19 ENCOUNTER — Other Ambulatory Visit: Payer: Self-pay | Admitting: Psychiatry

## 2021-09-19 DIAGNOSIS — Z634 Disappearance and death of family member: Secondary | ICD-10-CM

## 2021-09-21 ENCOUNTER — Encounter (HOSPITAL_BASED_OUTPATIENT_CLINIC_OR_DEPARTMENT_OTHER): Payer: HMO | Admitting: Internal Medicine

## 2021-09-21 DIAGNOSIS — L97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: Secondary | ICD-10-CM | POA: Diagnosis not present

## 2021-09-21 DIAGNOSIS — T8781 Dehiscence of amputation stump: Secondary | ICD-10-CM | POA: Diagnosis not present

## 2021-09-22 DIAGNOSIS — T8189XA Other complications of procedures, not elsewhere classified, initial encounter: Secondary | ICD-10-CM | POA: Diagnosis not present

## 2021-09-22 DIAGNOSIS — Z430 Encounter for attention to tracheostomy: Secondary | ICD-10-CM | POA: Diagnosis not present

## 2021-09-22 NOTE — Progress Notes (Signed)
KOBI, MARIO ([id number]) Visit Report for 09/21/2021 Arrival Information Details Patient Name: [name], [name]. Date of Service: 09/21/2021 2:30 PM Medical Record Number: [id number] Patient Account Number: [id number] Date of Birth/Sex: September 03, 1960 (61 y.o. M) Treating RN: Donnamarie Poag Primary Care Jerard Bays: Derinda Late Other Clinician: Referring Tarik Teixeira: Derinda Late Treating Rune Mendez/Extender: Yaakov Guthrie in Treatment: 27 Visit Information History Since Last Visit Added or deleted any medications: No Patient Arrived: Wheel Chair Had a fall or experienced change in No Arrival Time: 14:39 activities of daily living that may affect Accompanied By: self risk of falls: Transfer Assistance: [employer] Patient Lift Hospitalized since last visit: No Patient Identification Verified: Yes Has Dressing in Place as Prescribed: Yes Secondary Verification Process Completed: Yes Pain Present Now: No Patient Requires Transmission-Based No Precautions: Patient Has Alerts: Yes Patient Alerts: Patient on Blood Thinner Eliquis Diabetic Electronic Signature(s) Signed: 09/21/2021 4:22:24 PM By: Donnamarie Poag Entered By: Donnamarie Poag on 09/21/2021 14:47:58 [name] ([id number]) -------------------------------------------------------------------------------- Encounter Discharge Information Details Patient Name: [name] Date of Service: 09/21/2021 2:30 PM Medical Record Number: [id number] Patient Account Number: [id number] Date of Birth/Sex: [birth year] (61 y.o. M) Treating RN: Donnamarie Poag Primary Care Bodhi Stenglein: Derinda Late Other Clinician: Referring Denney Shein: Derinda Late Treating Anniece Bleiler/Extender: Yaakov Guthrie in Treatment: 44 Encounter Discharge Information Items Post Procedure Vitals Discharge Condition: Stable Temperature (F): 98.5 Ambulatory Status: Wheelchair Pulse (bpm): 110 Discharge Destination: Home Health Respiratory Rate (breaths/min):  16 Telephoned: No Blood Pressure (mmHg): 144/84 Orders Sent: Yes Transportation: Private Auto Accompanied By: self Schedule Follow-up Appointment: Yes Clinical Summary of Care: Electronic Signature(s) Signed: 09/21/2021 4:22:24 PM By: Donnamarie Poag Entered By: Donnamarie Poag on 09/21/2021 15:23:28 [name] ([id number]) -------------------------------------------------------------------------------- Lower Extremity Assessment Details Patient Name: [name] Date of Service: 09/21/2021 2:30 PM Medical Record Number: [id number] Patient Account Number: [id number] Date of Birth/Sex: [birth year] (61 y.o. M) Treating RN: Donnamarie Poag Primary Care Chinedum Vanhouten: Derinda Late Other Clinician: Referring Calleen Alvis: Derinda Late Treating Mysti Haley/Extender: Yaakov Guthrie in Treatment: 17 Edema Assessment Assessed: [name]: Yes] [Right: No] Edema: [Left: N] [Right: o] Notes L BKA Electronic Signature(s) Signed: 09/21/2021 4:22:24 PM By: Donnamarie Poag Entered By: Donnamarie Poag on 09/21/2021 14:54:17 [name] ([id number]) -------------------------------------------------------------------------------- Multi Wound Chart Details Patient Name: [name] Date of Service: 09/21/2021 2:30 PM Medical Record Number: [id number] Patient Account Number: [id number] Date of Birth/Sex: [birth year] (61 y.o. M) Treating RN: Donnamarie Poag Primary Care Deja Pisarski: Derinda Late Other Clinician: Referring Jaystin Mcgarvey: Derinda Late Treating Alaa Mullally/Extender: Yaakov Guthrie in Treatment: 17 Vital Signs Holder(in): 71 Pulse(bpm): 112 Weight(lbs): 250 Blood Pressure(mmHg): 144/84 Body Mass Index(BMI): 34.9 Temperature(F): 98.5 Respiratory Rate(breaths/min): 16 Photos: [N/A:N/A] Wound Location: Left Amputation Site - Below Knee N/A N/A Wounding Event: Surgical Injury N/A N/A Primary Etiology: Open Surgical Wound N/A N/A Secondary Etiology: Diabetic Wound/Ulcer of the Lower N/A  N/A Extremity Comorbid History: Coronary Artery Disease, N/A N/A Hypertension, Type I Diabetes Date Acquired: 04/12/2021 N/A N/A Weeks of Treatment: 17 N/A N/A Wound Status: Open N/A N/A Wound Recurrence: No N/A N/A Pending Amputation on Yes N/A N/A Presentation: Measurements L x W x D (cm) 1.7x4x0.4 N/A N/A Area (cm) : 5.341 N/A N/A Volume (cm) : 2.136 N/A N/A % Reduction in Area: 92.40% N/A N/A % Reduction in Volume: 99.00% N/A N/A Starting Position 1 (o'clock): 11 Ending Position 1 (o'clock): 12 Maximum Distance 1 (cm): 0.4 Undermining: Yes N/A N/A Classification: Full Thickness With Exposed N/A N/A Support Structures Exudate Amount: Large N/A N/A Exudate  Type: Serosanguineous N/A N/A Exudate Color: red, brown N/A N/A Granulation Amount: Large (67-100%) N/A N/A Granulation Quality: Red, Hyper-granulation N/A N/A Necrotic Amount: Small (1-33%) N/A N/A Exposed Structures: Fat Layer (Subcutaneous Tissue): N/A N/A Yes Fascia: No Tendon: No Muscle: No Joint: No Bone: No Epithelialization: None N/A N/A Assessment Notes: [facility] wound vac removed N/A N/A Treatment Notes [name], [name] ([id number]) Electronic Signature(s) Signed: 09/21/2021 4:22:24 PM By: Donnamarie Poag Entered By: Donnamarie Poag on 09/21/2021 14:55:10 [name] ([id number]) -------------------------------------------------------------------------------- Multi-Disciplinary Care Plan Details Patient Name: [name] Date of Service: 09/21/2021 2:30 PM Medical Record Number: [id number] Patient Account Number: [id number] Date of Birth/Sex: 09-24-1960 (61 y.o. M) Treating RN: Donnamarie Poag Primary Care Earsie Humm: Derinda Late Other Clinician: Referring Linden Mikes: Derinda Late Treating Ram Haugan/Extender: Yaakov Guthrie in Treatment: 18 Active Inactive Wound/Skin Impairment Nursing Diagnoses: Impaired tissue integrity Knowledge deficit related to smoking impact on wound healing Knowledge deficit  related to ulceration/compromised skin integrity Goals: Patient/caregiver will verbalize understanding of skin care regimen Date Initiated: 05/25/2021 Date Inactivated: 06/15/2021 Target Resolution Date: 07/04/2021 Goal Status: Met Ulcer/skin breakdown will have a volume reduction of 30% by week 4 Date Initiated: 05/25/2021 Date Inactivated: 06/29/2021 Target Resolution Date: 06/22/2021 Goal Status: Met Ulcer/skin breakdown will have a volume reduction of 50% by week 8 Date Initiated: 05/25/2021 Date Inactivated: 07/13/2021 Target Resolution Date: 07/20/2021 Goal Status: Met Ulcer/skin breakdown will have a volume reduction of 80% by week 12 Date Initiated: 05/25/2021 Target Resolution Date: 08/10/2021 Goal Status: Active Ulcer/skin breakdown will heal within 14 weeks Date Initiated: 05/25/2021 Target Resolution Date: 09/07/2021 Goal Status: Active Interventions: Assess patient/caregiver ability to obtain necessary supplies Assess patient/caregiver ability to perform ulcer/skin care regimen upon admission and as needed Assess ulceration(s) every visit Notes: Electronic Signature(s) Signed: 09/21/2021 4:22:24 PM By: Donnamarie Poag Entered By: Donnamarie Poag on 09/21/2021 14:54:29 [name] ([id number]) -------------------------------------------------------------------------------- Pain Assessment Details Patient Name: [name] Date of Service: 09/21/2021 2:30 PM Medical Record Number: [id number] Patient Account Number: [id number] Date of Birth/Sex: 21-Apr-1961 (61 y.o. M) Treating RN: Donnamarie Poag Primary Care Nainika Newlun: Derinda Late Other Clinician: Referring Daysen Gundrum: Derinda Late Treating Brit Wernette/Extender: Yaakov Guthrie in Treatment: 17 Active Problems Location of Pain Severity and Description of Pain Patient Has Paino No Site Locations Rate the pain. Current Pain Level: 0 Pain Management and Medication Current Pain Management: Electronic Signature(s) Signed:  09/21/2021 4:22:24 PM By: Donnamarie Poag Entered By: Donnamarie Poag on 09/21/2021 14:49:29 [name] ([id number]) -------------------------------------------------------------------------------- Patient/Caregiver Education Details Patient Name: [name] Date of Service: 09/21/2021 2:30 PM Medical Record Number: [id number] Patient Account Number: [id number] Date of Birth/Gender: June 28, 1960 (61 y.o. M) Treating RN: Donnamarie Poag Primary Care Physician: Derinda Late Other Clinician: Referring Physician: Derinda Late Treating Physician/Extender: Yaakov Guthrie in Treatment: 17 Education Assessment Education Provided To: Patient Education Topics Provided Basic Hygiene: Elevated Blood Sugar/ Impact on Healing: Pressure: Wound Debridement: Wound/Skin Impairment: Electronic Signature(s) Signed: 09/21/2021 4:22:24 PM By: Donnamarie Poag Entered By: Donnamarie Poag on 09/21/2021 14:57:17 [name] ([id number]) -------------------------------------------------------------------------------- Wound Assessment Details Patient Name: [name] Date of Service: 09/21/2021 2:30 PM Medical Record Number: [id number] Patient Account Number: [id number] Date of Birth/Sex: 20-Nov-1960 (61 y.o. M) Treating RN: Donnamarie Poag Primary Care Dontavian Marchi: Derinda Late Other Clinician: Referring Shaft Corigliano: Derinda Late Treating Nyrah Demos/Extender: Yaakov Guthrie in Treatment: 17 Wound Status Wound Number: 1 Primary Etiology: Open Surgical Wound Wound Location: Left Amputation Site - Below Knee Secondary Diabetic Wound/Ulcer of the Lower Extremity Etiology:  Wounding Event: Surgical Injury Wound Status: Open Date Acquired: 04/12/2021 Comorbid History: Coronary Artery Disease, Hypertension, Type I Weeks Of Treatment: 17 Diabetes Clustered Wound: No Pending Amputation On Presentation Photos Wound Measurements Length: (cm) 1.7 Width: (cm) 4 Depth: (cm) 0.4 Area: (cm)  5.341 Volume: (cm) 2.136 % Reduction in Area: 92.4% % Reduction in Volume: 99% Epithelialization: None Tunneling: Yes Position (o'clock): 11 Maximum Distance: (cm) 0.5 Undermining: Yes Starting Position (o'clock): 11 Ending Position (o'clock): 12 Maximum Distance: (cm) 0.4 Wound Description Classification: Full Thickness With Exposed Support Structures Exudate Amount: Large Exudate Type: Serosanguineous Exudate Color: red, brown Foul Odor After Cleansing: No Slough/Fibrino Yes Wound Bed Granulation Amount: Large (67-100%) Exposed Structure Granulation Quality: Red, Hyper-granulation Fascia Exposed: No Necrotic Amount: Small (1-33%) Fat Layer (Subcutaneous Tissue) Exposed: Yes Necrotic Quality: Adherent Slough Tendon Exposed: No Muscle Exposed: No Joint Exposed: No Bone Exposed: No Assessment Notes [facility] wound vac removed [name], [name] ([id number]) Treatment Notes Wound #1 (Amputation Site - Below Knee) Wound Laterality: Left Cleanser Dakin 16 (oz) 0.25 Discharge Instruction: Use as directed. Normal Saline Discharge Instruction: Wash your hands with soap and water. Remove old dressing, discard into plastic bag and place into trash. Cleanse the wound with Normal Saline prior to applying a clean dressing using gauze sponges, not tissues or cotton balls. Do not scrub or use excessive force. Pat dry using gauze sponges, not tissue or cotton balls. Wound Cleanser Discharge Instruction: Wash your hands with soap and water. Remove old dressing, discard into plastic bag and place into trash. Cleanse the wound with Wound Cleanser prior to applying a clean dressing using gauze sponges, not tissues or cotton balls. Do not scrub or use excessive force. Pat dry using gauze sponges, not tissue or cotton balls. Peri-Wound Care Topical Primary Dressing Iodoform 1/4x 5 (in/yd) Discharge Instruction: At wound appt to tunnel--Apply Iodoform Packing Strip as instructed. Prisma 4.34  (in) Discharge Instruction: For HH-Collagen to tunnel above BKA-Moisten w/normal saline or sterile water; Cover wound as directed. Do not remove from wound bed. Secondary Dressing (NON-BORDER) Zetuvit Plus Silicone NON-BORDER 5x5 (in/in) Discharge Instruction: Please do not put silicone bordered dressings under wraps. Use non-bordered dressing only under wraps. Secured With ACE WRAP - 51M ACE Elastic Bandage With VELCRO Brand Closure, 4 (in) Discharge Instruction: PRN to keep dressing in place Medipore Tape - 51M [place] Surgical Tape, 2x2 (in/yd) Kerlix Roll Sterile or Non-Sterile 6-ply 4.5x4 (yd/yd) Discharge Instruction: Apply Kerlix as directed Compression Wrap Compression Stockings Add-Ons Electronic Signature(s) Signed: 09/21/2021 4:22:24 PM By: Donnamarie Poag Entered By: Donnamarie Poag on 09/21/2021 15:27:08 [name] ([id number]) -------------------------------------------------------------------------------- [place] Details Patient Name: [name] Date of Service: 09/21/2021 2:30 PM Medical Record Number: [id number] Patient Account Number: [id number] Date of Birth/Sex: October 08, 1960 (61 y.o. M) Treating RN: Donnamarie Poag Primary Care Sumner Boesch: Derinda Late Other Clinician: Referring Kayli Beal: Derinda Late Treating Luanne Krzyzanowski/Extender: Yaakov Guthrie in Treatment: 17 Vital Signs Time Taken: 14:48 Temperature (F): 98.5 Holder (in): 71 Pulse (bpm): 112 Weight (lbs): 250 Respiratory Rate (breaths/min): 16 Body Mass Index (BMI): 34.9 Blood Pressure (mmHg): 144/84 Reference Range: 80 - 120 mg / dl Electronic Signature(s) Signed: 09/21/2021 4:22:24 PM By: Donnamarie Poag Entered ByDonnamarie Poag on 09/21/2021 14:49:22

## 2021-09-22 NOTE — Progress Notes (Signed)
[name], [name] ([id number]) Visit Report for 09/21/2021 Chief Complaint Document Details Patient Name: [name], [name]. Date of Service: 09/21/2021 2:30 PM Medical Record Number: [id number] Patient Account Number: [id number] Date of Birth/Sex: 05-03-1961 (61 y.o. M) Treating RN: Donnamarie Poag Primary Care Provider: Derinda Late Other Clinician: Referring Provider: Derinda Late Treating Provider/Extender: Yaakov Guthrie in Treatment: 17 Information Obtained from: Patient Chief Complaint Left lower extremity amputation site wound Electronic Signature(s) Signed: 09/21/2021 3:38:39 PM By: Kalman Shan DO Entered By: Kalman Shan on 09/21/2021 15:30:27 [name] ([id number]) -------------------------------------------------------------------------------- Debridement Details Patient Name: [name] Date of Service: 09/21/2021 2:30 PM Medical Record Number: [id number] Patient Account Number: [id number] Date of Birth/Sex: December 25, 1960 (61 y.o. M) Treating RN: Donnamarie Poag Primary Care Provider: Derinda Late Other Clinician: Referring Provider: Derinda Late Treating Provider/Extender: Yaakov Guthrie in Treatment: 17 Debridement Performed for Wound #1 Left Amputation Site - Below Knee Assessment: Performed By: Physician Kalman Shan, MD Debridement Type: Debridement Severity of Tissue Pre Debridement: Fat layer exposed Level of Consciousness (Pre- Awake and Alert procedure): Pre-procedure Verification/Time Out Yes - 15:05 Taken: Start Time: 15:06 Pain Control: Lidocaine Total Area Debrided (L x W): 1.7 (cm) x 4 (cm) = 6.8 (cm) Tissue and other material Viable, Non-Viable, Slough, Subcutaneous, Slough debrided: Level: Skin/Subcutaneous Tissue Debridement Description: Excisional Instrument: Curette Bleeding: Minimum Hemostasis Achieved: Pressure Response to Treatment: Procedure was tolerated well Level of Consciousness (Post- Awake and  Alert procedure): Post Debridement Measurements of Total Wound Length: (cm) 1.7 Width: (cm) 4 Depth: (cm) 0.4 Volume: (cm) 2.136 Character of Wound/Ulcer Post Debridement: Improved Severity of Tissue Post Debridement: Fat layer exposed Post Procedure Diagnosis Same as Pre-procedure Electronic Signature(s) Signed: 09/21/2021 3:38:39 PM By: Kalman Shan DO Signed: 09/21/2021 4:22:24 PM By: Donnamarie Poag Entered By: Donnamarie Poag on 09/21/2021 15:07:40 [name] ([id number]) -------------------------------------------------------------------------------- HPI Details Patient Name: [name] Date of Service: 09/21/2021 2:30 PM Medical Record Number: [id number] Patient Account Number: [id number] Date of Birth/Sex: 1960-09-27 (61 y.o. M) Treating RN: Donnamarie Poag Primary Care Provider: Derinda Late Other Clinician: Referring Provider: Derinda Late Treating Provider/Extender: Yaakov Guthrie in Treatment: 17 History of Present Illness HPI Description: Admission 05/25/2021 Mr. Hughes Wyndham is a 61 year old male with a past medical history of uncontrolled type 1 diabetes on insulin with last hemoglobin A1c of 8.8, peripheral arterial disease, status post left BKA that presents to the clinic for a left BKA amputation site wound. Patient developed a diabetic foot infection on 04/01/2021 and ultimately underwent a left BKA on 04/12/2021. The wound site became infected and he underwent an IandD on 05/07/2021. The wound was left open at that time. He has been using wet-to-dry dressings for the past 3 weeks. He currently denies Systemic signs of infection. 1/25; patient presents for follow-up. He has been using Dakin's wet-to-dry dressings with improvement in wound healing. He has no issues or complaints today. He denies signs of infection. 2/8; Patient presents for follow-up. He continues To use Dakin's wet-to-dry dressings. He has no issues or complaints today. He denies signs  of infection. He follows up with his surgeon next week. 2/22; patient presents for follow-up. He continues to use Dakin's wet-to-dry dressings. He denies signs of infection. He saw his vascular surgeon on 06/24/2021. They took out the staples. Nothing further to report on. 3/8; patient presents for follow-up. He has been using Dakin's wet-to-dry dressings without issues. He denies signs of infection. 3/22; patient presents for follow-up. She has been using the wound vac  without any issues. She states that in the past 2 days she has had more tenderness to the wound bed. She denies signs of infection. 4/5; patient presents for follow-up. He has been using the wound VAC without issues. He has noticed improvement in wound healing. 4/19; patient presents for follow-up. He continues to use the wound VAC without issues. 5/3; patient presents for follow-up. He continues to use the wound VAC without issues. He has no complaints today. 5/17; patient presents for follow-up. He has been using the wound VAC without issues. There is a small open wound more medial to the original wound that does tunnel slightly. He currently denies signs of infection. Electronic Signature(s) Signed: 09/21/2021 3:38:39 PM By: Kalman Shan DO Entered By: Kalman Shan on 09/21/2021 15:31:09 [name] ([id number]) -------------------------------------------------------------------------------- Physical Exam Details Patient Name: [name], [name]. Date of Service: 09/21/2021 2:30 PM Medical Record Number: [id number] Patient Account Number: [id number] Date of Birth/Sex: 05-Feb-1961 (61 y.o. M) Treating RN: Donnamarie Poag Primary Care Provider: Derinda Late Other Clinician: Referring Provider: Derinda Late Treating Provider/Extender: Yaakov Guthrie in Treatment: 17 Constitutional . Cardiovascular . Psychiatric . Notes Left BKA with wound at the amputation site with granulation tissue and slough. No  undermining noted. Overall appears well-healing. Medial to this wound there is a small open area that tunnels. No signs of surrounding infection. Electronic Signature(s) Signed: 09/21/2021 3:38:39 PM By: Kalman Shan DO Entered By: Kalman Shan on 09/21/2021 15:32:14 [name] ([id number]) -------------------------------------------------------------------------------- Physician Orders Details Patient Name: [name] Date of Service: 09/21/2021 2:30 PM Medical Record Number: [id number] Patient Account Number: [id number] Date of Birth/Sex: 17-Nov-1960 (61 y.o. M) Treating RN: Donnamarie Poag Primary Care Provider: Derinda Late Other Clinician: Referring Provider: Derinda Late Treating Provider/Extender: Yaakov Guthrie in Treatment: 54 Verbal / Phone Orders: No Diagnosis Coding Follow-up Appointments o Return Appointment in 1 week. [place] only foam ok to use with wound [place] for wound care. May utilize formulary equivalent dressing for wound treatment orders unless otherwise specified. Home Health Nurse may visit PRN to address patientos wound care needs. - CONTACT [facility] IF ANY PROBLEMS OR QUESTIONS OR CONCERNS OF USE o Scheduled days for dressing changes to be completed; exception, patient has scheduled wound care visit that day. o **Please direct any NON-WOUND related issues/requests for orders to patient's Primary Care Physician. **If current dressing causes regression in wound condition, may D/C ordered dressing product/s and apply Normal Saline Moist Dressing daily until next [place] or Other MD appointment. **Notify [facility] Center of regression in wound condition at [phone]. Bathing/ Shower/ Hygiene o No tub bath. Anesthetic (Use 'Patient Medications' Section for Anesthetic Order Entry) o Lidocaine applied to wound bed Edema Control - Lymphedema /  Segmental Compressive Device / Other o DO YOUR BEST to sleep in the bed at night. DO NOT sleep in your recliner. Long hours of sitting in a recliner leads to swelling of the legs and/or potential wounds on your backside. Additional Orders / Instructions o Follow Nutritious Diet and Increase Protein Intake - monitor blood sugar to promote wound healing o Other: - Keep appointment with surgeon and PM and R after healed for prostheis as they had scheduled Negative Pressure Wound Therapy o Wound VAC settings at 174mHg continuous pressure. Use foam to wound cavity. Please order WHITE foam to fill any tunnel/s and/or undermining when necessary. Change VAC dressing 3 X WEEK. Change canister as indicated  when full. - Make sure wound vac foam is in contact with the complete wound service to prevent the slough formation on edges-wet to dry dressing applied at wound appt until [facility] can replace wound [place] Nurse may d/c VAC for s/s of increased infection, significant wound regression, or uncontrolled drainage. [place] at [phone]. o Number of foam/gauze pieces used in the dressing = o Other: - Dakins wet to dry applied at wound care visit until [facility] can replace wound vac Wound Treatment Wound #1 - Amputation Site - Below Knee Wound Laterality: Left Cleanser: Dakin 16 (oz) 0.25 3 x Per Week/15 Days Discharge Instructions: Use as directed. Cleanser: Normal Saline 3 x Per Week/15 Days Discharge Instructions: Wash your hands with soap and water. Remove old dressing, discard into plastic bag and place into trash. Cleanse the wound with Normal Saline prior to applying a clean dressing using gauze sponges, not tissues or cotton balls. Do not scrub or use excessive force. Pat dry using gauze sponges, not tissue or cotton balls. Cleanser: Wound Cleanser 3 x Per Week/15 Days Discharge Instructions: Wash your hands with soap and water. Remove old dressing, discard into  plastic bag and place into trash. Cleanse the wound with Wound Cleanser prior to applying a clean dressing using gauze sponges, not tissues or cotton balls. Do not scrub or use excessive force. Pat dry using gauze sponges, not tissue or cotton balls. Primary Dressing: Iodoform 1/4x 5 (in/yd) 3 x Per Week/15 Days Discharge Instructions: At wound appt to tunnel--Apply Iodoform Packing Strip as instructed. Primary Dressing: Prisma 4.34 (in) 3 x Per Week/15 Days [name], [name] ([id number]) Discharge Instructions: For HH-Collagen to tunnel above BKA-Moisten w/normal saline or sterile water; Cover wound as directed. Do not remove from wound bed. Secondary Dressing: (NON-BORDER) Zetuvit Plus Silicone NON-BORDER 5x5 (in/in) 3 x Per Week/15 Days Discharge Instructions: Please do not put silicone bordered dressings under wraps. Use non-bordered dressing only under wraps. Secured With: ACE WRAP - 57M ACE Elastic Bandage With VELCRO Brand Closure, 4 (in) 3 x Per Week/15 Days Discharge Instructions: PRN to keep dressing in place Secured With: [place] Surgical Tape, 2x2 (in/yd) 3 x Per Week/15 Days Secured With: [employer] or Non-Sterile 6-ply 4.5x4 (yd/yd) 3 x Per Week/15 Days Discharge Instructions: Apply Kerlix as directed Electronic Signature(s) Signed: 09/21/2021 3:38:39 PM By: Kalman Shan DO Entered By: Kalman Shan on 09/21/2021 15:38:05 [name] ([id number]) -------------------------------------------------------------------------------- Problem List Details Patient Name: [name] Date of Service: 09/21/2021 2:30 PM Medical Record Number: [id number] Patient Account Number: [id number] Date of Birth/Sex: [birth year] (61 y.o. 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 M) Treating RN: Donnamarie Poag Primary Care Provider: Derinda Late Other Clinician: Referring Provider: Derinda Late Treating Provider/Extender: Yaakov Guthrie in Treatment: 17 Subjective Chief Complaint Information obtained from Patient Left lower extremity amputation site wound History of Present Illness (HPI) Admission 05/25/2021 Mr. Enrique Manganaro is a 61 year old male with a past medical history of uncontrolled type 1 diabetes on insulin with last hemoglobin A1c of 8.8, peripheral arterial disease, status post left BKA that presents to the clinic for a left BKA amputation site wound. Patient developed a diabetic foot infection on 04/01/2021 and ultimately underwent a left BKA on 04/12/2021. The wound site became infected and he underwent an IandD on 05/07/2021. The wound was left open at that time. He has been using wet-to-dry dressings for the past 3 weeks. He currently denies Systemic signs of infection. 1/25; patient presents for follow-up. He has been  using Dakin's wet-to-dry dressings with improvement in wound healing. He has no issues or complaints today. He denies signs of infection. 2/8; Patient presents for follow-up. He continues To use Dakin's wet-to-dry dressings. He has no issues or complaints today. He denies signs of infection. He follows up with his surgeon next week. 2/22; patient presents for follow-up. He continues to use Dakin's wet-to-dry dressings. He denies signs of infection. He saw his vascular surgeon on 06/24/2021. They took out the staples. Nothing further to report on. 3/8; patient presents for follow-up. He has been using Dakin's wet-to-dry dressings without issues. He denies signs of infection. 3/22; patient presents for follow-up. She has been using the wound vac without any issues. She states that in the past 2 days she has had more tenderness to the wound bed. She denies signs of infection. 4/5; patient presents for follow-up. He has been using the wound VAC without issues. He has noticed improvement in wound healing. 4/19; patient presents for follow-up. He continues to use the wound VAC without issues. 5/3; patient presents for follow-up. He continues to use the wound VAC without issues. He has no complaints today. 5/17; patient presents for follow-up. He has been using the wound VAC without issues. There is a small open wound more medial to the original wound that does tunnel slightly. He currently denies signs of infection. Objective Constitutional Vitals Time Taken: 2:48 PM, Holder: 71 in, Weight: 250 lbs, BMI: 34.9, Temperature: 98.5 F, Pulse: 112 bpm, Respiratory Rate: 16 breaths/min, Blood Pressure: 144/84 mmHg. General Notes: Left BKA with wound at the amputation site with granulation tissue and slough. No undermining noted. Overall appears well- healing. Medial to this wound there is a small open area that tunnels. No signs of surrounding infection. Integumentary (Hair, Skin) Wound #1 status is Open.  Original cause of wound was Surgical Injury. The date acquired was: 04/12/2021. The wound has been in treatment 17 weeks. The wound is located on the Left Amputation Site - Below Knee. The wound measures 1.7cm length x 4cm width x 0.4cm depth; 5.341cm^2 area and 2.136cm^3 volume. There is Fat Layer (Subcutaneous Tissue) exposed. Tunneling has been noted at 11:00 with a maximum distance of 0.5cm. Undermining begins at 11:00 and ends at 12:00 with a maximum distance of 0.4cm. There is a large amount of serosanguineous drainage noted. There is large (67-100%) red, hyper - granulation within the wound bed. There is a small (1-33%) amount of necrotic tissue within the wound bed including Adherent Slough. 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 I debrided nonviable tissue. I recommended continuing the wound VAC. Unfortunately he has developed a small open area that tunnels Just medial to this wound. He would likely benefit from collagen when he has the [facility] in place. For now when he is in between [facility] changes he can do Dakin's wet-to-dry dressing to all areas. We will place mupirocin ointment and gentamicin to the tunneled area in office only To address any bioburden. Follow-up in 1 week. Procedures Wound #1 Pre-procedure diagnosis of Wound #1 is an Open Surgical Wound located on the Left Amputation Site - Below Knee .Severity of Tissue Pre Debridement is: Fat layer exposed. There was a Excisional Skin/Subcutaneous Tissue Debridement with a total area of 6.8 sq cm performed  by Kalman Shan, MD. With the following instrument(s): Curette to remove Viable and Non-Viable tissue/material. Material removed includes Subcutaneous Tissue and Slough and after achieving pain control using Lidocaine. A time out was conducted at 15:05, prior to the start of the procedure. A Minimum amount of bleeding was controlled with Pressure. The procedure was tolerated well. Post Debridement Measurements: 1.7cm length x 4cm width x 0.4cm depth; 2.136cm^3 volume. Character of Wound/Ulcer Post Debridement is improved. Severity of Tissue Post Debridement is: Fat layer exposed. Post procedure Diagnosis Wound #1: Same as Pre-Procedure Plan Follow-up Appointments: Return Appointment in 1 week. Home Health: [place] only foam ok to use with wound [place] for wound care. May utilize formulary equivalent dressing for wound treatment orders unless otherwise specified. Home Health Nurse may visit PRN to address patient s wound care needs. - CONTACT [facility] IF ANY PROBLEMS OR QUESTIONS OR CONCERNS OF USE Scheduled days for dressing changes to be completed; exception, patient has scheduled wound care visit that day. **Please direct any NON-WOUND related issues/requests for orders to patient's Primary Care Physician. **If current dressing causes regression in wound condition, may D/C ordered dressing product/s and apply Normal Saline Moist Dressing daily until next [place] or Other MD appointment. **Notify Wound Healing Center of regression in wound condition at [phone]. Bathing/ Shower/ Hygiene: No tub bath. Anesthetic (Use 'Patient Medications' Section for Anesthetic Order Entry): Lidocaine applied to wound bed Edema Control - Lymphedema / Segmental Compressive Device / Other: DO YOUR BEST to sleep in the bed at night. DO NOT sleep in your recliner. Long hours of sitting in a recliner leads to swelling of the legs and/or potential  wounds on your backside. Additional Orders / Instructions: Follow Nutritious Diet and Increase Protein Intake - monitor blood sugar to promote wound healing Other: - Keep appointment with surgeon and PM and R after healed for prostheis as they had scheduled Negative Pressure Wound Therapy: Wound VAC settings at 151mHg continuous pressure. Use foam to wound cavity. Please order WHITE foam to fill any tunnel/s and/or undermining when necessary. Change VAC dressing 3 X WEEK. Change canister as indicated when full. - Make sure wound vac foam is in contact with the complete wound service to prevent the slough formation on edges-wet to dry dressing applied at wound appt [facility]Nurse may d/c VAC for s/s of increased infection, significant wound regression,  or uncontrolled drainage. [place] at [phone]. Number of foam/gauze pieces used in the dressing = Other: - Dakins wet to dry applied at wound care visit until [facility] can replace wound vac WOUND #1: - Amputation Site - Below Knee Wound Laterality: Left Cleanser: Dakin 16 (oz) 0.25 3 x Per Week/15 Days Discharge Instructions: Use as directed. Cleanser: Normal Saline 3 x Per Week/15 Days [name], [name] ([id number]) Discharge Instructions: Wash your hands with soap and water. Remove old dressing, discard into plastic bag and place into trash. Cleanse the wound with Normal Saline prior to applying a clean dressing using gauze sponges, not tissues or cotton balls. Do not scrub or use excessive force. Pat dry using gauze sponges, not tissue or cotton balls. Cleanser: Wound Cleanser 3 x Per Week/15 Days Discharge Instructions: Wash your hands with soap and water. Remove old dressing, discard into plastic bag and place into trash. Cleanse the wound with Wound Cleanser prior to applying a clean dressing using gauze sponges, not tissues or cotton balls. Do not scrub or use excessive force. Pat dry  using gauze sponges, not tissue or cotton balls. Primary Dressing: Iodoform 1/4x 5 (in/yd) 3 x Per Week/15 Days Discharge Instructions: At wound appt to tunnel--Apply Iodoform Packing Strip as instructed. Primary Dressing: Prisma 4.34 (in) 3 x Per Week/15 Days Discharge Instructions: For HH-Collagen to tunnel above BKA-Moisten w/normal saline or sterile water; Cover wound as directed. Do not remove from wound bed. Secondary Dressing: (NON-BORDER) Zetuvit Plus Silicone NON-BORDER 5x5 (in/in) 3 x Per Week/15 Days Discharge Instructions: Please do not put silicone bordered dressings under wraps. Use non-bordered dressing only under wraps. Secured With: ACE WRAP - 17M ACE Elastic Bandage With VELCRO Brand Closure, 4 (in) 3 x Per Week/15 Days Discharge Instructions: PRN to keep dressing in place Secured With: [place] Surgical Tape, 2x2 (in/yd) 3 x Per Week/15 Days Secured With: [employer] Sterile or Non-Sterile 6-ply 4.5x4 (yd/yd) 3 x Per Week/15 Days Discharge Instructions: Apply Kerlix as directed 1. Continue wound VAC, can use collagen to the tunneled areas 2. Use Dakin's wet-to-dry dressing in between VAC changes 3. Follow-up in 1 week Electronic Signature(s) Signed: 09/21/2021 3:38:39 PM By: Kalman Shan DO Entered By: Kalman Shan on 09/21/2021 15:37:47 [name], [name] ([id number]) -------------------------------------------------------------------------------- SuperBill Details Patient Name: [name] Date of Service: 09/21/2021 Medical Record Number: [id number] Patient Account Number: [id number] Date of Birth/Sex: Sep 22, 1960 (61 y.o. 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 Facility Procedures CPT4: Description Modifier Quantity Code [id number] 11042 - DEB SUBQ TISSUE 20 SQ CM/< 1 CPT4: ICD-10 Diagnosis Description L97.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

## 2021-09-28 ENCOUNTER — Encounter (HOSPITAL_BASED_OUTPATIENT_CLINIC_OR_DEPARTMENT_OTHER): Payer: HMO | Admitting: Internal Medicine

## 2021-09-28 DIAGNOSIS — T8131XA Disruption of external operation (surgical) wound, not elsewhere classified, initial encounter: Secondary | ICD-10-CM

## 2021-09-28 DIAGNOSIS — T8781 Dehiscence of amputation stump: Secondary | ICD-10-CM | POA: Diagnosis not present

## 2021-09-28 DIAGNOSIS — L97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: Secondary | ICD-10-CM | POA: Diagnosis not present

## 2021-09-28 NOTE — Progress Notes (Signed)
[name], [name] ([id number]) Visit Report for 09/28/2021 Arrival Information Details Patient Name: [name], [name]. Date of Service: 09/28/2021 1:00 PM Medical Record Number: [id number] Patient Account Number: [id number] Date of Birth/Sex: 10/06/1960 (61 y.o. M) Treating RN: Levora Dredge Primary Care Duvall Comes: Derinda Late Other Clinician: Referring Kayliee Atienza: Derinda Late Treating Fateh Kindle/Extender: Yaakov Guthrie in Treatment: 18 Visit Information History Since Last Visit Added or deleted any medications: No Patient Arrived: Wheel Chair Any new allergies or adverse reactions: No Arrival Time: 13:02 Had a fall or experienced change in No Accompanied By: family activities of daily living that may affect Transfer Assistance: None risk of falls: Patient Identification Verified: Yes Hospitalized since last visit: No Secondary Verification Process Completed: Yes Has Dressing in Place as Prescribed: Yes Patient Requires Transmission-Based No Pain Present Now: No Precautions: Patient Has Alerts: Yes Patient Alerts: Patient on Blood Thinner Eliquis Diabetic Electronic Signature(s) Signed: 09/28/2021 4:35:14 PM By: Levora Dredge Entered By: Levora Dredge on 09/28/2021 13:05:08 [name] ([id number]) -------------------------------------------------------------------------------- Clinic Level of Care Assessment Details Patient Name: [name] Date of Service: 09/28/2021 1:00 PM Medical Record Number: [id number] Patient Account Number: [id number] Date of Birth/Sex: 01-24-1961 (61 y.o. M) Treating RN: Levora Dredge Primary Care Armani Gawlik: Derinda Late Other Clinician: Referring Jobina Maita: Derinda Late Treating Dennis Hegeman/Extender: Yaakov Guthrie in Treatment: 18 Clinic Level of Care Assessment Items TOOL 1 Quantity Score [] - Use when EandM and Procedure is performed on INITIAL visit 0 ASSESSMENTS - Nursing Assessment / Reassessment [] -  General Physical Exam (combine w/ comprehensive assessment (listed just below) when performed on new 0 pt. evals) [] - 0 Comprehensive Assessment (HX, ROS, Risk Assessments, Wounds Hx, etc.) ASSESSMENTS - Wound and Skin Assessment / Reassessment [] - Dermatologic / Skin Assessment (not related to wound area) 0 ASSESSMENTS - Ostomy and/or Continence Assessment and Care [] - Incontinence Assessment and Management 0 [] - 0 Ostomy Care Assessment and Management (repouching, etc.) PROCESS - Coordination of Care [] - Simple Patient / Family Education for ongoing care 0 [] - 0 Complex (extensive) Patient / Family Education for ongoing care [] - 0 Staff obtains Consents, Records, Test Results / Process Orders [] - 0 Staff telephones HHA, Nursing Homes / Clarify orders / etc [] - 0 Routine Transfer to another Facility (non-emergent condition) [] - 0 Routine Hospital Admission (non-emergent condition) [] - 0 New Admissions / Biomedical engineer / Ordering NPWT, Apligraf, etc. [] - 0 Emergency Hospital Admission (emergent condition) PROCESS - Special Needs [] - Pediatric / Minor Patient Management 0 [] - 0 Isolation Patient Management [] - 0 Hearing / Language / Visual special needs [] - 0 Assessment of Community assistance (transportation, D/C planning, etc.) [] - 0 Additional assistance / Altered mentation [] - 0 Support Surface(s) Assessment (bed, cushion, seat, etc.) INTERVENTIONS - Miscellaneous [] - External ear exam 0 [] - 0 Patient Transfer (multiple staff / Civil Service fast streamer / Similar devices) [] - 0 Simple Staple / Suture removal (25 or less) [] - 0 Complex Staple / Suture removal (26 or more) [] - 0 Hypo/Hyperglycemic Management (do not check if billed separately) [] - 0 Ankle / Brachial Index (ABI) - do not check if billed separately Has the patient been seen at the hospital within the last three years: Yes Total Score: 0 Level Of Care: ____ [name]  ([id number]) Electronic Signature(s) Signed: 09/28/2021 4:35:14 PM By: Levora Dredge Entered By: Levora Dredge on 09/28/2021 14:13:56 [name] ([id number]) --------------------------------------------------------------------------------  Encounter Discharge Information Details Patient Name: [name], [name]. Date of Service: 09/28/2021 1:00 PM Medical Record Number: [id number] Patient Account Number: [id number] Date of Birth/Sex: [birth year] (61 y.o. M) Treating RN: Levora Dredge Primary Care : Derinda Late Other Clinician: Referring : Derinda Late Treating /Extender: Yaakov Guthrie in Treatment: 23 Encounter Discharge Information Items Post Procedure Vitals Discharge Condition: Stable Temperature (F): 98.4 Ambulatory Status: Cane Pulse (bpm): 88 Discharge Destination: Home Respiratory Rate (breaths/min): 20 Transportation: Private Auto Blood Pressure (mmHg): 142/78 Accompanied By: self Schedule Follow-up Appointment: Yes Clinical Summary of Care: Electronic Signature(s) Signed: 09/28/2021 2:28:08 PM By: Levora Dredge Entered By: Levora Dredge on 09/28/2021 14:28:08 [name] ([id number]) -------------------------------------------------------------------------------- Lower Extremity Assessment Details Patient Name: [name] Date of Service: 09/28/2021 1:00 PM Medical Record Number: [id number] Patient Account Number: [id number] Date of Birth/Sex: December 04, 1960 (61 y.o. M) Treating RN: Levora Dredge Primary Care : Derinda Late Other Clinician: Referring : Derinda Late Treating /Extender: Yaakov Guthrie in Treatment: 18 Electronic Signature(s) Signed: 09/28/2021 4:35:14 PM By: Levora Dredge Entered By: Levora Dredge on 09/28/2021 13:19:05 [name] ([id number]) -------------------------------------------------------------------------------- Multi Wound Chart  Details Patient Name: [name], [name]. Date of Service: 09/28/2021 1:00 PM Medical Record Number: [id number] Patient Account Number: [id number] Date of Birth/Sex: 1960-05-29 (61 y.o. M) Treating RN: Levora Dredge Primary Care : Derinda Late Other Clinician: Referring : Derinda Late Treating /Extender: Yaakov Guthrie in Treatment: 18 Vital Signs Holder(in): 71 Pulse(bpm): 57 Weight(lbs): 250 Blood Pressure(mmHg): 142/78 Body Mass Index(BMI): 34.9 Temperature(F): 98.4 Respiratory Rate(breaths/min): 18 Photos: [N/A:N/A] Wound Location: Left Amputation Site - Below [name] Left, Medial Amputation Site - N/A Below [name] Wounding Event: Surgical Injury Gradually Appeared N/A Primary Etiology: Open Surgical Wound To be determined N/A Secondary Etiology: Diabetic Wound/Ulcer of the Lower N/A N/A Extremity Comorbid History: Coronary Artery Disease, Coronary Artery Disease, N/A Hypertension, Type I Diabetes Hypertension, Type I Diabetes Date Acquired: 04/12/2021 09/21/2021 N/A Weeks of Treatment: 18 0 N/A Wound Status: Open Open N/A Wound Recurrence: No No N/A Pending Amputation on Yes No N/A Presentation: Measurements L x W x D (cm) 1.7x3.5x0.7 0.5x0.5x0.8 N/A Area (cm) : 4.673 0.196 N/A Volume (cm) : 3.271 0.157 N/A % Reduction in Area: 93.40% N/A N/A % Reduction in Volume: 98.40% N/A N/A Position 1 (o'clock): 10 Maximum Distance 1 (cm): 0.8 Tunneling: No Yes N/A Classification: Full Thickness With Exposed Full Thickness Without Exposed N/A Support Structures Support Structures Exudate Amount: Large Medium N/A Exudate Type: Serosanguineous Serosanguineous N/A Exudate Color: red, brown red, brown N/A Granulation Amount: Large (67-100%) Medium (34-66%) N/A Granulation Quality: Red, Hyper-granulation Red N/A Necrotic Amount: Small (1-33%) Medium (34-66%) N/A Exposed Structures: Fat Layer (Subcutaneous Tissue): Fat Layer (Subcutaneous Tissue):  N/A Yes Yes Fascia: No Tendon: No Muscle: No Joint: No Bone: No Epithelialization: Small (1-33%) None N/A Treatment Notes [name], [name] ([id number]) Electronic Signature(s) Signed: 09/28/2021 4:35:14 PM By: Levora Dredge Entered By: Levora Dredge on 09/28/2021 13:19:22 [name] ([id number]) -------------------------------------------------------------------------------- [place] Details Patient Name: [name], [name]. Date of Service: 09/28/2021 1:00 PM Medical Record Number: [id number] Patient Account Number: [id number] Date of Birth/Sex: [birth year] (61 y.o. M) Treating RN: Levora Dredge Primary Care : Derinda Late Other Clinician: Referring : Derinda Late Treating /Extender: Yaakov Guthrie in Treatment: 74 Active Inactive Wound/Skin Impairment Nursing Diagnoses: Impaired tissue integrity Knowledge deficit related to smoking impact on wound healing Knowledge deficit related to ulceration/compromised skin integrity Goals: Patient/caregiver will verbalize understanding of skin care regimen Date Initiated:  05/25/2021 Date Inactivated: 06/15/2021 Target Resolution Date: 07/04/2021 Goal Status: Met Ulcer/skin breakdown will have a volume reduction of 30% by week 4 Date Initiated: 05/25/2021 Date Inactivated: 06/29/2021 Target Resolution Date: 06/22/2021 Goal Status: Met Ulcer/skin breakdown will have a volume reduction of 50% by week 8 Date Initiated: 05/25/2021 Date Inactivated: 07/13/2021 Target Resolution Date: 07/20/2021 Goal Status: Met Ulcer/skin breakdown will have a volume reduction of 80% by week 12 Date Initiated: 05/25/2021 Target Resolution Date: 08/10/2021 Goal Status: Active Ulcer/skin breakdown will heal within 14 weeks Date Initiated: 05/25/2021 Target Resolution Date: 09/07/2021 Goal Status: Active Interventions: Assess patient/caregiver ability to obtain necessary supplies Assess patient/caregiver  ability to perform ulcer/skin care regimen upon admission and as needed Assess ulceration(s) every visit Notes: Electronic Signature(s) Signed: 09/28/2021 4:35:14 PM By: Levora Dredge Entered By: Levora Dredge on 09/28/2021 13:19:14 [name] ([id number]) -------------------------------------------------------------------------------- Pain Assessment Details Patient Name: [name] Date of Service: 09/28/2021 1:00 PM Medical Record Number: [id number] Patient Account Number: [id number] Date of Birth/Sex: [birth year] (61 y.o. M) Treating RN: Levora Dredge Primary Care Heloise Gordan: Derinda Late Other Clinician: Referring Clarion Mooneyhan: Derinda Late Treating Lori-Ann Lindfors/Extender: Yaakov Guthrie in Treatment: 39 Active Problems Location of Pain Severity and Description of Pain Patient Has Paino No Site Locations Rate the pain. Current Pain Level: 0 Pain Management and Medication Current Pain Management: Electronic Signature(s) Signed: 09/28/2021 4:35:14 PM By: Levora Dredge Entered By: Levora Dredge on 09/28/2021 13:07:40 [name] ([id number]) -------------------------------------------------------------------------------- Patient/Caregiver Education Details Patient Name: [name] Date of Service: 09/28/2021 1:00 PM Medical Record Number: [id number] Patient Account Number: [id number] Date of Birth/Gender: May 21, 1960 (61 y.o. M) Treating RN: Levora Dredge Primary Care Physician: Derinda Late Other Clinician: Referring Physician: Derinda Late Treating Physician/Extender: Yaakov Guthrie in Treatment: 28 Education Assessment Education Provided To: Patient Education Topics Provided Wound Debridement: Handouts: Wound Debridement Methods: Explain/Verbal Responses: State content correctly Wound/Skin Impairment: Handouts: Caring for Your Ulcer Methods: Explain/Verbal Responses: State content correctly Electronic Signature(s) Signed:  09/28/2021 4:35:14 PM By: Levora Dredge Entered By: Levora Dredge on 09/28/2021 14:14:30 [name] ([id number]) -------------------------------------------------------------------------------- Wound Assessment Details Patient Name: [name] Date of Service: 09/28/2021 1:00 PM Medical Record Number: [id number] Patient Account Number: [id number] Date of Birth/Sex: 12-09-1960 (61 y.o. 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 [name] Secondary Diabetic Wound/Ulcer of the Lower Extremity Etiology: Wounding Event: Surgical Injury Wound Status: Open Date Acquired: 04/12/2021 Comorbid History: Coronary Artery Disease, Hypertension, Type I Weeks Of Treatment: 18 Diabetes Clustered Wound: No Pending Amputation On Presentation Photos Wound Measurements Length: (cm) 1.7 Width: (cm) 3.5 Depth: (cm) 0.7 Area: (cm) 4.673 Volume: (cm) 3.271 % Reduction in Area: 93.4% % Reduction in Volume: 98.4% Epithelialization: Small (1-33%) Tunneling: No Undermining: No Wound Description Classification: Full Thickness With Exposed Support Structure Exudate Amount: Large Exudate Type: Serosanguineous Exudate Color: red, brown s Foul Odor After Cleansing: No Slough/Fibrino Yes Wound Bed Granulation Amount: Large (67-100%) Exposed Structure Granulation Quality: Red, Hyper-granulation Fascia Exposed: No Necrotic Amount: Small (1-33%) Fat Layer (Subcutaneous Tissue) Exposed: Yes Necrotic Quality: Adherent Slough Tendon Exposed: No Muscle Exposed: No Joint Exposed: No Bone Exposed: No Treatment Notes Wound #1 (Amputation Site - Below [name]) Wound Laterality: Left Cleanser Dakin 16 (oz) 0.25 Discharge Instruction: Use as  directed. Normal Saline [name], [name] ([id number]) Discharge Instruction:  Wash your hands with soap and water. Remove old dressing, discard into plastic bag and place into trash. Cleanse the wound with Normal Saline prior to applying a clean dressing using gauze sponges, not tissues or cotton balls. Do not scrub or use excessive force. Pat dry using gauze sponges, not tissue or cotton balls. Wound Cleanser Discharge Instruction: Wash your hands with soap and water. Remove old dressing, discard into plastic bag and place into trash. Cleanse the wound with Wound Cleanser prior to applying a clean dressing using gauze sponges, not tissues or cotton balls. Do not scrub or use excessive force. Pat dry using gauze sponges, not tissue or cotton balls. Peri-Wound Care Topical Primary Dressing Gauze Discharge Instruction: As directed: moistened with Dakins Solution Secondary Dressing (NON-BORDER) Zetuvit Plus Silicone NON-BORDER 5x5 (in/in) Discharge Instruction: Please do not put silicone bordered dressings under wraps. Use non-bordered dressing only under wraps. Secured With ACE WRAP - 66M ACE Elastic Bandage With VELCRO Brand Closure, 4 (in) Discharge Instruction: PRN to keep dressing in place [place] Surgical Tape, 2x2 (in/yd) Kerlix Roll Sterile or Non-Sterile 6-ply 4.5x4 (yd/yd) Discharge Instruction: Apply Kerlix as directed Compression Wrap Compression Stockings Add-Ons Electronic Signature(s) Signed: 09/28/2021 4:35:14 PM By: Levora Dredge Entered By: Levora Dredge on 09/28/2021 13:17:22 [name] ([id number]) -------------------------------------------------------------------------------- Wound Assessment Details Patient Name: [name] Date of Service: 09/28/2021 1:00 PM Medical Record Number: [id number] Patient Account Number: [id number] Date of Birth/Sex: 06-29-1960 (61 y.o. M) Treating RN: Levora Dredge Primary Care :  Derinda Late Other Clinician: Referring : Derinda Late Treating /Extender: Yaakov Guthrie in Treatment: 18 Wound Status Wound Number: 2 Primary Etiology: To be determined Wound Location: Left, Medial Amputation Site - Below [name] Wound Status: Open Wounding Event: Gradually Appeared Comorbid Coronary Artery Disease, Hypertension, Type I History: Diabetes Date Acquired: 09/21/2021 Weeks Of Treatment: 0 Clustered Wound: No Photos Wound Measurements Length: (cm) 0.5 Width: (cm) 0.5 Depth: (cm) 0.8 Area: (cm) 0.196 Volume: (cm) 0.157 % Reduction in Area: % Reduction in Volume: Epithelialization: None Tunneling: Yes Position (o'clock): 10 Maximum Distance: (cm) 0.8 Undermining: No Wound Description Classification: Full Thickness Without Exposed Support Structures Exudate Amount: Medium Exudate Type: Serosanguineous Exudate Color: red, brown Foul Odor After Cleansing: No Slough/Fibrino Yes Wound Bed Granulation Amount: Medium (34-66%) Exposed Structure Granulation Quality: Red Fat Layer (Subcutaneous Tissue) Exposed: Yes Necrotic Amount: Medium (34-66%) Necrotic Quality: Adherent Slough Treatment Notes Wound #2 (Amputation Site - Below [name]) Wound Laterality: Left, Medial Cleanser Dakin 16 (oz) 0.25 Discharge Instruction: Use as directed. Soap and Water Discharge Instruction: Gently cleanse wound with antibacterial soap, rinse and pat dry prior to dressing wounds [name], [name] ([id number]) Wound Cleanser Discharge Instruction: Wash your hands with soap and water. Remove old dressing, discard into plastic bag and place into trash. Cleanse the wound with Wound Cleanser prior to applying a clean dressing using gauze sponges, not tissues or cotton balls. Do not scrub or use excessive force. Pat dry using gauze sponges, not tissue or cotton balls. Peri-Wound Care Topical Gentamicin Discharge Instruction: Apply as directed by . Mixed  1:1 with Mupirocin IN OFFICE ONLY Mupirocin Ointment Discharge Instruction: Apply as directed by . MIxed 1:1 with gent IN OFFICE ONLY Primary Dressing Prisma 4.34 (in) Discharge Instruction: Moisten w/normal saline or sterile water; Cover wound as directed. Do not remove from wound bed. Please change this when wound vac changed Secondary Dressing (NON-BORDER) Zetuvit Plus Silicone NON-BORDER 5x5 (in/in) Discharge Instruction: Please do  not put silicone bordered dressings under wraps. Use non-bordered dressing only under wraps. Secured With ACE WRAP - 40M ACE Elastic Bandage With VELCRO Brand Closure, 4 (in) Medipore Tape - 40M Medipore H Soft Cloth Surgical Tape, 2x2 (in/yd) Kerlix Roll Sterile or Non-Sterile 6-ply 4.5x4 (yd/yd) Discharge Instruction: Apply Kerlix as directed Compression Wrap Compression Stockings Add-Ons Electronic Signature(s) Signed: 09/28/2021 4:35:14 PM By: Levora Dredge Entered By: Levora Dredge on 09/28/2021 13:18:55 [name] ([id number]) -------------------------------------------------------------------------------- Vitals Details Patient Name: [name] Date of Service: 09/28/2021 1:00 PM Medical Record Number: [id number] Patient Account Number: [id number] Date of Birth/Sex: 09-29-1960 (61 y.o. M) Treating RN: Levora Dredge Primary Care : Derinda Late Other Clinician: Referring : Derinda Late Treating /Extender: Yaakov Guthrie in Treatment: 18 Vital Signs Time Taken: 13:05 Temperature (F): 98.4 Holder (in): 71 Pulse (bpm): 88 Weight (lbs): 250 Respiratory Rate (breaths/min): 18 Body Mass Index (BMI): 34.9 Blood Pressure (mmHg): 142/78 Reference Range: 80 - 120 mg / dl Electronic Signature(s) Signed: 09/28/2021 4:35:14 PM By: Levora Dredge Entered By: Levora Dredge on 09/28/2021 13:07:29

## 2021-09-28 NOTE — Progress Notes (Signed)
FLORA, RATZ ([id number]) Visit Report for 09/28/2021 Chief Complaint Document Details Patient Name: [name], [name]. Date of Service: 09/28/2021 1:00 PM Medical Record Number: [id number] Patient Account Number: [id number] Date of Birth/Sex: 10-22-1960 (61 y.o. M) Treating RN: [name] Primary Care Provider: Derinda Holder Other Clinician: Referring Provider: Derinda Holder Treating Provider/Extender: [name] in Treatment: 18 Information Obtained from: Patient Chief Complaint Left lower extremity amputation site wound Electronic Signature(s) Signed: 09/28/2021 2:19:48 PM By: Joel Shan DO Entered By: [name] on 09/28/2021 13:44:40 [name] ([id number]) -------------------------------------------------------------------------------- Debridement Details Patient Name: [name] Date of Service: 09/28/2021 1:00 PM Medical Record Number: [id number] Patient Account Number: [id number] Date of Birth/Sex: February 14, 1961 (61 y.o. M) Treating RN: [name] Primary Care Provider: Derinda Holder Other Clinician: Referring Provider: Derinda Holder Treating Provider/Extender: [name] in Treatment: 18 Debridement Performed for Wound #1 Left Amputation Site - Below Knee Assessment: Performed By: Physician Joel Shan, MD Debridement Type: Debridement Severity of Tissue Pre Debridement: Fat layer exposed Level of Consciousness (Pre- Awake and Alert procedure): Pre-procedure Verification/Time Out Yes - 13:38 Taken: Pain Control: Lidocaine 4% Topical Solution Total Area Debrided (L x W): 1.7 (cm) x 3.5 (cm) = 5.95 (cm) Tissue and other material Viable, Non-Viable, Slough, Subcutaneous, Slough debrided: Level: Skin/Subcutaneous Tissue Debridement Description: Excisional Instrument: Curette Bleeding: Minimum Hemostasis Achieved: Pressure Response to Treatment: Procedure was tolerated well Level of Consciousness  (Post- Awake and Alert procedure): Post Debridement Measurements of Total Wound Length: (cm) 1.7 Width: (cm) 3.5 Depth: (cm) 0.7 Volume: (cm) 3.271 Character of Wound/Ulcer Post Debridement: Stable Severity of Tissue Post Debridement: Fat layer exposed Post Procedure Diagnosis Same as Pre-procedure Electronic Signature(s) Signed: 09/28/2021 2:19:48 PM By: Joel Shan DO Signed: 09/28/2021 4:35:14 PM By: [name] Entered By: [name] on 09/28/2021 13:38:58 [name] ([id number]) -------------------------------------------------------------------------------- HPI Details Patient Name: [name] Date of Service: 09/28/2021 1:00 PM Medical Record Number: [id number] Patient Account Number: [id number] Date of Birth/Sex: 11/05/1960 (61 y.o. M) Treating RN: [name] Primary Care Provider: Derinda Holder Other Clinician: Referring Provider: Derinda Holder Treating Provider/Extender: [name] in Treatment: 18 History of Present Illness HPI Description: Admission 05/25/2021 [name] is a 61 year old male with a past medical history of uncontrolled type 1 diabetes on insulin with last hemoglobin A1c of 8.8, peripheral arterial disease, status post left BKA that presents to the clinic for a left BKA amputation site wound. Patient developed a diabetic foot infection on 04/01/2021 and ultimately underwent a left BKA on 04/12/2021. The wound site became infected and he underwent an IandD on 05/07/2021. The wound was left open at that time. He has been using wet-to-dry dressings for the past 3 weeks. He currently denies Systemic signs of infection. 1/25; patient presents for follow-up. He has been using Dakin's wet-to-dry dressings with improvement in wound healing. He has no issues or complaints today. He denies signs of infection. 2/8; Patient presents for follow-up. He continues To use Dakin's wet-to-dry dressings. He has no issues or  complaints today. He denies signs of infection. He follows up with his surgeon next week. 2/22; patient presents for follow-up. He continues to use Dakin's wet-to-dry dressings. He denies signs of infection. He saw his vascular surgeon on 06/24/2021. They took out the [name]. Nothing further to report on. 3/8; patient presents for follow-up. He has been using Dakin's wet-to-dry dressings without issues. He denies signs of infection. 3/22; patient presents for follow-up. She has been using the wound vac  without any issues. She states that in the past 2 days she has had more tenderness to the wound bed. She denies signs of infection. 4/5; patient presents for follow-up. He has been using the wound VAC without issues. He has noticed improvement in wound healing. 4/19; patient presents for follow-up. He continues to use the wound VAC without issues. 5/3; patient presents for follow-up. He continues to use the wound VAC without issues. He has no complaints today. 5/17; patient presents for follow-up. He has been using the wound VAC without issues. There is a small open wound more medial to the original wound that does tunnel slightly. He currently denies signs of infection. 5/24; patient presents for follow-up. Earlier in the week home health called to let us know that there was yellow-green drainage in the canister of the wound VAC. He has since stopped the wound VAC and has been using Dakin's wet-to-dry dressings. Electronic Signature(s) Signed: 09/28/2021 2:19:48 PM By: Joel Shan DO Entered By: [name] on 09/28/2021 14:04:21 [name] ([id number]) -------------------------------------------------------------------------------- Physical Exam Details Patient Name: [name], [name]. Date of Service: 09/28/2021 1:00 PM Medical Record Number: [id number] Patient Account Number: [id number] Date of Birth/Sex: 1960/12/10 (61 y.o. M) Treating RN: [name] Primary Care Provider:  Derinda Holder Other Clinician: Referring Provider: Derinda Holder Treating Provider/Extender: [name] in Treatment: 18 Constitutional . Psychiatric . Notes Left BKA with wound at the amputation site with granulation tissue and slough. No undermining noted. Overall appears well-healing. Medial to this wound there is a small open area that tunnels. Blue/green drainage on dressing today. Electronic Signature(s) Signed: 09/28/2021 2:19:48 PM By: Joel Shan DO Entered By: [name] on 09/28/2021 14:14:52 [name] ([id number]) -------------------------------------------------------------------------------- Physician Orders Details Patient Name: [name] Date of Service: 09/28/2021 1:00 PM Medical Record Number: [id number] Patient Account Number: [id number] Date of Birth/Sex: [birth year] (61 y.o. M) Treating RN: [name] Primary Care Provider: Derinda Holder Other Clinician: Referring Provider: Derinda Holder Treating Provider/Extender: [name] in Treatment: 56 Verbal / Phone Orders: No Diagnosis Coding Follow-up Appointments o Return Appointment in 1 week. [place] only foam ok to use with wound [place] for wound care. May utilize formulary equivalent dressing for wound treatment orders unless otherwise specified. Home Health Nurse may visit PRN to address patientos wound care needs. - CONTACT [facility] IF ANY PROBLEMS OR QUESTIONS OR CONCERNS OF USE o Scheduled days for dressing changes to be completed; exception, patient has scheduled wound care visit that day. o **Please direct any NON-WOUND related issues/requests for orders to patient's Primary Care Physician. **If current dressing causes regression in wound condition, may D/C ordered dressing product/s and apply Normal Saline Moist Dressing daily until next [place] or Other MD appointment.  **Notify Wound Healing Center of regression in wound condition at [phone]. Bathing/ Shower/ Hygiene o No tub bath. Anesthetic (Use 'Patient Medications' Section for Anesthetic Order Entry) o Lidocaine applied to wound bed Edema Control - Lymphedema / Segmental Compressive Device / Other o DO YOUR BEST to sleep in the bed at night. DO NOT sleep in your recliner. Long hours of sitting in a recliner leads to swelling of the legs and/or potential wounds on your backside. Additional Orders / Instructions o Follow Nutritious Diet and Increase Protein Intake - monitor blood sugar to promote wound healing o Other: - Keep appointment with surgeon and PM and R after healed for prostheis as they had  scheduled Negative Pressure Wound Therapy o Wound VAC settings at 154mHg continuous pressure. Use foam to wound cavity. Please order WHITE foam to fill any tunnel/s and/or undermining when necessary. Change VAC dressing 3 X WEEK. Change canister as indicated when full. - Make sure wound vac foam is in contact with the complete wound service to prevent the slough formation on edges-wet to dry dressing applied at wound appt [facility] vParisNurse may d/c VAC for s/s of increased infection, significant wound regression, or uncontrolled drainage. [place] [phone] o Number of foam/gauze pieces used in the dressing = o Other: - Dakins wet to dry applied at wound care visit [facility] Wound Treatment Wound #1 - Amputation Site - Below Knee Wound Laterality: Left Cleanser: Dakin 16 (oz) 0.25 1 x Per Day/15 Days Discharge Instructions: Use as directed. Cleanser: Normal Saline 1 x Per DUJW/11Days Discharge Instructions: Wash your hands with soap and water. Remove old dressing, discard into plastic bag and place into trash. Cleanse the wound with Normal Saline prior to applying a clean dressing using gauze sponges,  not tissues or cotton balls. Do not scrub or use excessive force. Pat dry using gauze sponges, not tissue or cotton balls. Cleanser: Wound Cleanser 1 x Per Day/15 Days Discharge Instructions: Wash your hands with soap and water. Remove old dressing, discard into plastic bag and place into trash. Cleanse the wound with Wound Cleanser prior to applying a clean dressing using gauze sponges, not tissues or cotton balls. Do not scrub or use excessive force. Pat dry using gauze sponges, not tissue or cotton balls. Primary Dressing: Gauze 1 x Per Day/15 Days Discharge Instructions: As directed: moistened with Dakins Solution Secondary Dressing: (NON-BORDER) Zetuvit Plus Silicone NON-BORDER 5x5 (in/in) 1 x Per Day/15 Days BJANIS, CUFFE([id number] Discharge Instructions: Please do not put silicone bordered dressings under wraps. Use non-bordered dressing only under wraps. Secured With: ACE WRAP - 88M ACE Elastic Bandage With VELCRO Brand Closure, 4 (in) 1 x Per Day/15 Days Discharge Instructions: PRN to keep dressing in place Secured With: MGrosse Pointe ParkSurgical Tape, 2x2 (in/yd) 1 x Per Day/15 Days Secured With: Kerlix Roll Sterile or Non-Sterile 6-ply 4.5x4 (yd/yd) 1 x Per Day/15 Days Discharge Instructions: Apply Kerlix as directed Wound #2 - Amputation Site - Below Knee Wound Laterality: Left, Medial Cleanser: Dakin 16 (oz) 0.25 3 x Per Week/30 Days Discharge Instructions: Use as directed. Cleanser: Soap and Water 3 x Per Week/30 Days Discharge Instructions: Gently cleanse wound with antibacterial soap, rinse and pat dry prior to dressing wounds Cleanser: Wound Cleanser 3 x Per Week/30 Days Discharge Instructions: Wash your hands with soap and water. Remove old dressing, discard into plastic bag and place into trash. Cleanse the wound with Wound Cleanser prior to applying a clean dressing using gauze sponges, not tissues or cotton balls. Do not scrub or use excessive  force. Pat dry using gauze sponges, not tissue or cotton balls. Topical: Gentamicin 3 x Per Week/30 Days Discharge Instructions: Apply as directed by provider. Mixed 1:1 with Mupirocin IN OFFICE ONLY Topical: Mupirocin Ointment 3 x Per Week/30 Days Discharge Instructions: Apply as directed by provider. MIxed 1:1 with gent IN OFFICE ONLY Primary Dressing: Prisma 4.34 (in) 3 x Per Week/30 Days Discharge Instructions: Moisten w/normal saline or sterile water; Cover wound as directed. Do not remove from wound bed. Please change this when wound  vac changed Secondary Dressing: (NON-BORDER) Zetuvit Plus Silicone NON-BORDER 5x5 (in/in) 3 x Per Week/30 Days Discharge Instructions: Please do not put silicone bordered dressings under wraps. Use non-bordered dressing only under wraps. Secured With: ACE WRAP - 58M ACE Elastic Bandage With VELCRO Brand Closure, 4 (in) 3 x Per Week/30 Days Secured With: Medipore Tape - 58M Medipore H Soft Cloth Surgical Tape, 2x2 (in/yd) 3 x Per Week/30 Days Secured With: [employer] Sterile or Non-Sterile 6-ply 4.5x4 (yd/yd) 3 x Per Week/30 Days Discharge Instructions: Apply Kerlix as directed Patient Medications Allergies: Depakote, Plavix, gabapentin, BuSpar Notifications Medication Indication Start End Dakin's Solution 09/28/2021 DOSE 1 - miscellaneous 0.25 % solution - moisten gauze for wet to dry dressings ciprofloxacin HCl 09/28/2021 DOSE 1 - oral 750 mg tablet - 1 tablet oral BID x 7 days Electronic Signature(s) Signed: 09/28/2021 2:19:48 PM By: Joel Shan DO Previous Signature: 09/28/2021 2:07:03 PM Version By: Joel Shan DO Previous Signature: 09/28/2021 1:51:23 PM Version By: Joel Shan DO Entered By: [name] on 09/28/2021 14:19:08 [name] ([id number]) -------------------------------------------------------------------------------- Problem List Details Patient Name: [name] Date of Service: 09/28/2021 1:00 PM Medical  Record Number: [id number] Patient Account Number: [id number] Date of Birth/Sex: [birth year] (61 y.o. 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 M) Treating RN: [name] Primary Care Provider: Derinda Holder Other Clinician: Referring Provider: Derinda Holder Treating Provider/Extender: [name] in Treatment: 33 Subjective Chief Complaint Information obtained from Patient Left lower extremity amputation site wound History of Present Illness (HPI) Admission 05/25/2021 Mr. Scotland Korver is a 61 year old male with a past medical history of uncontrolled type 1 diabetes on insulin with last hemoglobin A1c of 8.8, peripheral arterial disease, status post left BKA that presents to the clinic for a left BKA amputation site wound. Patient developed a diabetic foot infection on 04/01/2021 and ultimately  underwent a left BKA on 04/12/2021. The wound site became infected and he underwent an IandD on 05/07/2021. The wound was left open at that time. He has been using wet-to-dry dressings for the past 3 weeks. He currently denies Systemic signs of infection. 1/25; patient presents for follow-up. He has been using Dakin's wet-to-dry dressings with improvement in wound healing. He has no issues or complaints today. He denies signs of infection. 2/8; Patient presents for follow-up. He continues To use Dakin's wet-to-dry dressings. He has no issues or complaints today. He denies signs of infection. He follows up with his surgeon next week. 2/22; patient presents for follow-up. He continues to use Dakin's wet-to-dry dressings. He denies signs of infection. He saw his vascular surgeon on 06/24/2021. They took out the [name]. Nothing further to report on. 3/8; patient presents  for follow-up. He has been using Dakin's wet-to-dry dressings without issues. He denies signs of infection. 3/22; patient presents for follow-up. She has been using the wound vac without any issues. She states that in the past 2 days she has had more tenderness to the wound bed. She denies signs of infection. 4/5; patient presents for follow-up. He has been using the wound VAC without issues. He has noticed improvement in wound healing. 4/19; patient presents for follow-up. He continues to use the wound VAC without issues. 5/3; patient presents for follow-up. He continues to use the wound VAC without issues. He has no complaints today. 5/17; patient presents for follow-up. He has been using the wound VAC without issues. There is a small open wound more medial to the original wound that does tunnel slightly. He currently denies signs of infection. 5/24; patient presents for follow-up. Earlier in the week home health called to let us know that there was yellow-green drainage in the canister of the wound VAC. He has since stopped the wound  VAC and has been using Dakin's wet-to-dry dressings. Objective Constitutional Vitals Time Taken: 1:05 PM, Holder: 71 in, Weight: 250 lbs, BMI: 34.9, Temperature: 98.4 F, Pulse: 88 bpm, Respiratory Rate: 18 breaths/min, Blood Pressure: 142/78 mmHg. General Notes: Left BKA with wound at the amputation site with granulation tissue and slough. No undermining noted. Overall appears well- healing. Medial to this wound there is a small open area that tunnels. Blue/green drainage on dressing today. Integumentary (Hair, Skin) Wound #1 status is Open. Original cause of wound was Surgical Injury. The date acquired was: 04/12/2021. The wound has been in treatment 18 weeks. The wound is located on the Left Amputation Site - Below Knee. The wound measures 1.7cm length x 3.5cm width x 0.7cm depth; 4.673cm^2 area and 3.271cm^3 volume. There is Fat Layer (Subcutaneous Tissue) exposed. There is no tunneling or undermining noted. There is a large amount of serosanguineous drainage noted. There is large (67-100%) red, hyper - granulation within the wound bed. There is a small (1-33%) amount of necrotic tissue within the wound bed including Adherent Slough. [name], [name] ([id number]) Wound #2 status is Open. Original cause of wound was Gradually Appeared. The date acquired was: 09/21/2021. The wound is located on the Left,Medial Amputation Site - Below Knee. The wound measures 0.5cm length x 0.5cm width x 0.8cm depth; 0.196cm^2 area and 0.157cm^3 volume. There is Fat Layer (Subcutaneous Tissue) exposed. There is no undermining noted, however, there is tunneling at 10:00 with a maximum distance of 0.8cm. There is a medium amount of serosanguineous drainage noted. There is medium (34-66%) red granulation within the wound bed. There is a medium (34-66%) amount of necrotic tissue within the wound bed including Adherent Slough. 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 I debrided nonviable tissue. There was blue/green drainage on exam highly suspicious for Pseudomonas. I will go ahead and treat with ciprofloxacin. I recommended continuing Dakin's wet-to-dry dressings and having the wound VAC reinitiated on Friday by home health. I also recommended continuing collagen to the tunnel. Procedures Wound #1 Pre-procedure diagnosis of Wound #1 is an Open Surgical Wound located on the Left Amputation Site - Below Knee .Severity of Tissue Pre Debridement is: Fat layer exposed. There  was a Excisional Skin/Subcutaneous Tissue Debridement with a total area of 5.95 sq cm performed by Joel Shan, MD. With the following instrument(s): Curette to remove Viable and Non-Viable tissue/material. Material removed includes Subcutaneous Tissue and Slough and after achieving pain control using Lidocaine 4% Topical Solution. No specimens were taken. A time out was conducted at 13:38, prior to the start of the procedure. A Minimum amount of bleeding was controlled with Pressure. The procedure was tolerated well. Post Debridement Measurements: 1.7cm length x 3.5cm width x 0.7cm depth; 3.271cm^3 volume. Character of Wound/Ulcer Post Debridement is stable. Severity of Tissue Post Debridement is: Fat layer exposed. Post procedure Diagnosis Wound #1: Same as Pre-Procedure Plan Follow-up Appointments: Return Appointment in 1 week. Home Health: [place] only foam ok to use with wound [place] for wound care. May utilize formulary equivalent dressing for wound treatment orders unless otherwise specified. Home Health Nurse may visit PRN to address patient s wound care needs. -  CONTACT [facility] IF ANY PROBLEMS OR QUESTIONS OR CONCERNS OF USE Scheduled days for dressing changes to be completed; exception, patient has scheduled wound care visit that day. **Please direct any NON-WOUND related issues/requests for orders to patient's Primary Care Physician. **If current dressing causes regression in wound condition, may D/C ordered dressing product/s and apply Normal Saline Moist Dressing daily until next [place] or Other MD appointment. **Notify Wound Healing Center of regression in wound condition at [phone]. Bathing/ Shower/ Hygiene: No tub bath. Anesthetic (Use 'Patient Medications' Section for Anesthetic Order Entry): Lidocaine applied to wound bed Edema Control - Lymphedema / Segmental Compressive Device / Other: DO YOUR BEST to sleep in the bed at night. DO NOT sleep in your recliner. Long hours of sitting in a recliner leads to swelling of the legs and/or potential wounds on your backside. Additional Orders / Instructions: Follow Nutritious Diet and Increase Protein Intake - monitor blood sugar to promote wound healing Other: - Keep appointment with surgeon and PM and R after healed for prostheis as they had scheduled Negative Pressure Wound Therapy: Wound VAC settings at 168mHg continuous pressure. Use foam to wound cavity. Please order WHITE foam to fill any tunnel/s and/or undermining when necessary. Change VAC dressing 3 X WEEK. Change canister as indicated when full. - Make sure wound vac foam is in contact with the complete wound service to prevent the slough formation on edges-wet to dry dressing applied at wound appt until [facility] vClyde HillNurse may d/c VAC for s/s of increased infection, significant wound regression, or uncontrolled drainage. Notify Wound Healing [name], [name]([id number] Center at [phone] Number of foam/gauze pieces used in the dressing = Other: - Dakins wet to dry applied at wound care visit  [facility] wound vac The following medication(s) was prescribed: Dakin's Solution miscellaneous 0.25 % solution 1 moisten gauze for wet to dry dressings starting 09/28/2021 ciprofloxacin HCl oral 750 mg tablet 1 1 tablet oral BID x 7 days starting 09/28/2021 WOUND #1: - Amputation Site - Below Knee Wound Laterality: Left Cleanser: Dakin 16 (oz) 0.25 3 x Per Week/15 Days Discharge Instructions: Use as directed. Cleanser: Normal Saline 3 x Per Week/15 Days Discharge Instructions: Wash your hands with soap and water. Remove old dressing, discard into plastic bag and place into trash. Cleanse the wound with Normal Saline prior to applying a clean dressing using gauze sponges, not tissues or cotton balls. Do not scrub or use excessive  force. Pat dry using gauze sponges, not tissue or cotton balls. Cleanser: Wound Cleanser 3 x Per Week/15 Days Discharge Instructions: Wash your hands with soap and water. Remove old dressing, discard into plastic bag and place into trash. Cleanse the wound with Wound Cleanser prior to applying a clean dressing using gauze sponges, not tissues or cotton balls. Do not scrub or use excessive force. Pat dry using gauze sponges, not tissue or cotton balls. Primary Dressing: Gauze 3 x Per Week/15 Days Discharge Instructions: As directed: moistened with Dakins Solution Secondary Dressing: (NON-BORDER) Zetuvit Plus Silicone NON-BORDER 5x5 (in/in) 3 x Per Week/15 Days Discharge Instructions: Please do not put silicone bordered dressings under wraps. Use non-bordered dressing only under wraps. 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 Cleanser: Soap and Water 3 x Per Week/30 Days Discharge Instructions: Gently cleanse wound with antibacterial soap, rinse and pat dry prior to dressing wounds Cleanser: Wound Cleanser 3 x Per Week/30 Days Discharge Instructions: Wash your hands with soap and water. Remove old dressing, discard into plastic bag and place into trash. Cleanse the wound with Wound Cleanser prior to applying a clean dressing using gauze sponges, not tissues or cotton balls. Do not scrub or use excessive force. Pat dry using gauze sponges, not tissue or cotton balls. Topical: Gentamicin 3 x Per Week/30 Days Discharge Instructions: Apply as directed by provider. Mixed 1:1 with Mupirocin IN OFFICE ONLY Topical: Mupirocin Ointment 3 x Per Week/30 Days Discharge Instructions: Apply as directed by provider. MIxed 1:1 with gent IN OFFICE ONLY Primary Dressing: Prisma 4.34 (in) 3 x Per Week/30 Days Discharge Instructions: Moisten w/normal saline or sterile water; Cover wound as directed. Do not remove from wound bed. Please change this when wound vac changed Secondary Dressing: (NON-BORDER) Zetuvit Plus Silicone NON-BORDER 5x5 (in/in) 3 x Per Week/30 Days Discharge Instructions: Please do not put silicone bordered dressings under wraps. Use non-bordered dressing only under wraps. Secured With: ACE WRAP - 67M ACE Elastic Bandage With VELCRO Brand Closure, 4 (in) 3 x Per Week/30 Days Secured With: Medipore Tape - 67M Medipore H Soft Cloth Surgical Tape, 2x2 (in/yd) 3 x Per Week/30 Days Secured With: [employer] Sterile or Non-Sterile 6-ply 4.5x4 (yd/yd) 3 x Per Week/30 Days Discharge Instructions: Apply Kerlix as directed 1. In office sharp debridement 2. Ciprofloxacin 3. Dakin's wet-to-dry dressing until wound VAC is initiated 4. Collagen to  the tunnel 5. Follow-up in 1 week Electronic Signature(s) Signed: 09/28/2021 2:19:48 PM By: Joel Shan DO Entered By: [name] on 09/28/2021 14:18:26 [name] ([id number]) -------------------------------------------------------------------------------- SuperBill Details Patient Name: [name] Date of Service: 09/28/2021 Medical Record Number: [id number] Patient Account Number: [id number] Date of Birth/Sex: [birth year] (61 y.o. 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 Facility Procedures CPT4: Description Modifier Quantity Code [id number] 11042 - DEB SUBQ TISSUE 20 SQ CM/< 1 CPT4: ICD-10 Diagnosis Description 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

## 2021-10-03 DIAGNOSIS — Z89512 Acquired absence of left leg below knee: Secondary | ICD-10-CM | POA: Diagnosis not present

## 2021-10-04 DIAGNOSIS — E119 Type 2 diabetes mellitus without complications: Secondary | ICD-10-CM | POA: Diagnosis not present

## 2021-10-04 DIAGNOSIS — I739 Peripheral vascular disease, unspecified: Secondary | ICD-10-CM | POA: Diagnosis not present

## 2021-10-04 DIAGNOSIS — Z7984 Long term (current) use of oral hypoglycemic drugs: Secondary | ICD-10-CM | POA: Diagnosis not present

## 2021-10-04 DIAGNOSIS — T148XXD Other injury of unspecified body region, subsequent encounter: Secondary | ICD-10-CM | POA: Diagnosis not present

## 2021-10-04 DIAGNOSIS — I10 Essential (primary) hypertension: Secondary | ICD-10-CM | POA: Diagnosis not present

## 2021-10-04 DIAGNOSIS — Z794 Long term (current) use of insulin: Secondary | ICD-10-CM | POA: Diagnosis not present

## 2021-10-04 DIAGNOSIS — Z4781 Encounter for orthopedic aftercare following surgical amputation: Secondary | ICD-10-CM | POA: Diagnosis not present

## 2021-10-05 ENCOUNTER — Encounter (HOSPITAL_BASED_OUTPATIENT_CLINIC_OR_DEPARTMENT_OTHER): Payer: HMO | Admitting: Internal Medicine

## 2021-10-05 DIAGNOSIS — T8781 Dehiscence of amputation stump: Secondary | ICD-10-CM | POA: Diagnosis not present

## 2021-10-05 DIAGNOSIS — E10622 Type 1 diabetes mellitus with other skin ulcer: Secondary | ICD-10-CM

## 2021-10-05 DIAGNOSIS — L97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: Secondary | ICD-10-CM

## 2021-10-06 DIAGNOSIS — T8189XA Other complications of procedures, not elsewhere classified, initial encounter: Secondary | ICD-10-CM | POA: Diagnosis not present

## 2021-10-06 NOTE — Progress Notes (Signed)
SCHYLAR, WUEBKER ([id number]) Visit Report for 10/05/2021 Chief Complaint Document Details Patient Name: [name], [name]. Date of Service: 10/05/2021 2:30 PM Medical Record Number: [id number] Patient Account Number: [id number] Date of Birth/Sex: Sep 30, 1960 (61 y.o. M) Treating RN: Cornell Barman Primary Care Provider: Derinda Late Other Clinician: Referring Provider: Derinda Late Treating Provider/Extender: Yaakov Guthrie in Treatment: 71 Information Obtained from: Patient Chief Complaint Left lower extremity amputation site wound Electronic Signature(s) Signed: 10/05/2021 3:50:43 PM By: Kalman Shan DO Entered By: Kalman Shan on 10/05/2021 15:32:37 [name] ([id number]) -------------------------------------------------------------------------------- Debridement Details Patient Name: [name] Date of Service: 10/05/2021 2:30 PM Medical Record Number: [id number] Patient Account Number: [id number] Date of Birth/Sex: [birth year] (61 y.o. M) Treating RN: Cornell Barman Primary Care Provider: Derinda Late Other Clinician: Referring Provider: Derinda Late Treating Provider/Extender: Yaakov Guthrie in Treatment: 19 Debridement Performed for Wound #1 Left Amputation Site - Below Knee Assessment: Performed By: Physician Kalman Shan, MD Debridement Type: Debridement Severity of Tissue Pre Debridement: Fat layer exposed Level of Consciousness (Pre- Awake and Alert procedure): Pre-procedure Verification/Time Out Yes - 14:54 Taken: Total Area Debrided (L x W): 1 (cm) x 2.4 (cm) = 2.4 (cm) Tissue and other material Viable, Slough, Subcutaneous, Slough debrided: Level: Skin/Subcutaneous Tissue Debridement Description: Excisional Instrument: Curette Bleeding: Minimum Hemostasis Achieved: Pressure Response to Treatment: Procedure was tolerated well Level of Consciousness (Post- Awake and Alert procedure): Post Debridement Measurements of Total  Wound Length: (cm) 1 Width: (cm) 2.4 Depth: (cm) 0.5 Volume: (cm) 0.942 Character of Wound/Ulcer Post Debridement: Stable Severity of Tissue Post Debridement: Fat layer exposed Post Procedure Diagnosis Same as Pre-procedure Electronic Signature(s) Signed: 10/05/2021 3:50:43 PM By: Kalman Shan DO Signed: 10/06/2021 9:49:48 AM By: Gretta Cool, BSN, RN, CWS, Kim RN, BSN Entered By: Gretta Cool, BSN, RN, CWS, Kim on 10/05/2021 14:55:10 [name] ([id number]) -------------------------------------------------------------------------------- HPI Details Patient Name: [name], [name]. Date of Service: 10/05/2021 2:30 PM Medical Record Number: [id number] Patient Account Number: [id number] Date of Birth/Sex: [birth year] (61 y.o. M) Treating RN: Cornell Barman Primary Care Provider: Derinda Late Other Clinician: Referring Provider: Derinda Late Treating Provider/Extender: Yaakov Guthrie in Treatment: 68 History of Present Illness HPI Description: Admission 05/25/2021 [name] is a 61 year old male with a past medical history of uncontrolled type 1 diabetes on insulin with last hemoglobin A1c of 8.8, peripheral arterial disease, status post left BKA that presents to the clinic for a left BKA amputation site wound. Patient developed a diabetic foot infection on 04/01/2021 and ultimately underwent a left BKA on 04/12/2021. The wound site became infected and he underwent an IandD on 05/07/2021. The wound was left open at that time. He has been using wet-to-dry dressings for the past 3 weeks. He currently denies Systemic signs of infection. 1/25; patient presents for follow-up. He has been using Dakin's wet-to-dry dressings with improvement in wound healing. He has no issues or complaints today. He denies signs of infection. 2/8; Patient presents for follow-up. He continues To use Dakin's wet-to-dry dressings. He has no issues or complaints today. He denies signs of infection. He follows up  with his surgeon next week. 2/22; patient presents for follow-up. He continues to use Dakin's wet-to-dry dressings. He denies signs of infection. He saw his vascular surgeon on 06/24/2021. They took out the staples. Nothing further to report on. 3/8; patient presents for follow-up. He has been using Dakin's wet-to-dry dressings without issues. He denies signs of infection. 3/22; patient presents for follow-up. She has been using the wound  vac without any issues. She states that in the past 2 days she has had more tenderness to the wound bed. She denies signs of infection. 4/5; patient presents for follow-up. He has been using the wound VAC without issues. He has noticed improvement in wound healing. 4/19; patient presents for follow-up. He continues to use the wound VAC without issues. 5/3; patient presents for follow-up. He continues to use the wound VAC without issues. He has no complaints today. 5/17; patient presents for follow-up. He has been using the wound VAC without issues. There is a small open wound more medial to the original wound that does tunnel slightly. He currently denies signs of infection. 5/24; patient presents for follow-up. Earlier in the week home health called to let us know that there was yellow-green drainage in the canister of the wound VAC. He has since stopped the wound VAC and has been using Dakin's wet-to-dry dressings. 5/31; patient presents for follow-up. He tolerated the wound VAC well over the past week. He has been taking ciprofloxacin with improvement to yellow-green drainage. He also states that the small tunneled wound is not sore anymore. Electronic Signature(s) Signed: 10/05/2021 3:50:43 PM By: Kalman Shan DO Entered By: Kalman Shan on 10/05/2021 15:33:18 [name] ([id number]) -------------------------------------------------------------------------------- Physical Exam Details Patient Name: [name], [name]. Date of Service: 10/05/2021 2:30  PM Medical Record Number: [id number] Patient Account Number: [id number] Date of Birth/Sex: [birth year] (61 y.o. M) Treating RN: Cornell Barman Primary Care Provider: Derinda Late Other Clinician: Referring Provider: Derinda Late Treating Provider/Extender: Yaakov Guthrie in Treatment: 19 Constitutional . Cardiovascular . Psychiatric . Notes Left BKA with wound at the amputation site with granulation tissue and slough. No undermining noted. Overall appears well-healing. Medial to this wound there is a small open area that tunnels. Electronic Signature(s) Signed: 10/05/2021 3:50:43 PM By: Kalman Shan DO Entered By: Kalman Shan on 10/05/2021 15:33:44 [name] ([id number]) -------------------------------------------------------------------------------- Physician Orders Details Patient Name: [name] Date of Service: 10/05/2021 2:30 PM Medical Record Number: [id number] Patient Account Number: [id number] Date of Birth/Sex: 1960-09-06 (62 y.o. M) Treating RN: Cornell Barman Primary Care Provider: Derinda Late Other Clinician: Referring Provider: Derinda Late Treating Provider/Extender: Yaakov Guthrie in Treatment: 45 Verbal / Phone Orders: No Diagnosis Coding Follow-up Appointments o Return Appointment in 1 week. [place] only foam ok to use with wound [place] for wound care. May utilize formulary equivalent dressing for wound treatment orders unless otherwise specified. Home Health Nurse may visit PRN to address patientos wound care needs. - CONTACT [facility] IF ANY PROBLEMS OR QUESTIONS OR CONCERNS OF USE o Scheduled days for dressing changes to be completed; exception, patient has scheduled wound care visit that day. o **Please direct any NON-WOUND related issues/requests for orders to patient's Primary Care Physician. **If current dressing causes regression in wound condition,  may D/C ordered dressing product/s and apply Normal Saline Moist Dressing daily until next [place] or Other MD appointment. **Notify [facility] of regression in wound condition at [phone]. Bathing/ Shower/ Hygiene o No tub bath. Anesthetic (Use 'Patient Medications' Section for Anesthetic Order Entry) o Benzocaine applied to wound bed. Edema Control - Lymphedema / Segmental Compressive Device / Other o DO YOUR BEST to sleep in the bed at night. DO NOT sleep in your recliner. Long hours of sitting in a recliner leads to swelling of the legs and/or potential wounds on your backside. Additional Orders /  Instructions o Follow Nutritious Diet and Increase Protein Intake - monitor blood sugar to promote wound healing o Other: - Keep appointment with surgeon and PM and R after healed for prostheis as they had scheduled Negative Pressure Wound Therapy o Wound VAC settings at 157mHg continuous pressure. Use foam to wound cavity. Please order WHITE foam to fill any tunnel/s and/or undermining when necessary. Change VAC dressing 3 X WEEK. Change canister as indicated when full. - Make sure wound vac foam is in contact with the complete wound service to prevent the slough formation on edges-wet to dry dressing applied at wound app[facility] [place] may d/c VAC for s/s of increased infection, significant wound regression, or uncontrolled drainage. [place] [phone] o Number of foam/gauze pieces used in the dressing = o Other: - Dakins wet to dry applied at wound care visit until [facility] Wound Treatment Wound #1 - Amputation Site - Below Knee Wound Laterality: Left Cleanser: Dakin 16 (oz) 0.25 1 x Per Day/15 Days Discharge Instructions: Use as directed. Cleanser: Normal Saline 1 x Per DEXB/28Days Discharge Instructions: Wash your hands with soap and water. Remove old dressing,  discard into plastic bag and place into trash. Cleanse the wound with Normal Saline prior to applying a clean dressing using gauze sponges, not tissues or cotton balls. Do not scrub or use excessive force. Pat dry using gauze sponges, not tissue or cotton balls. Cleanser: Wound Cleanser 1 x Per Day/15 Days Discharge Instructions: Wash your hands with soap and water. Remove old dressing, discard into plastic bag and place into trash. Cleanse the wound with Wound Cleanser prior to applying a clean dressing using gauze sponges, not tissues or cotton balls. Do not scrub or use excessive force. Pat dry using gauze sponges, not tissue or cotton balls. Primary Dressing: Gauze 1 x Per DUXL/24Days Discharge Instructions: In office: moistened with Dakins Solution Primary Dressing: Prisma 4.34 (in) 1 x Per Day/15 Days BLYSANDER, CALIXTE([id number] Discharge Instructions: Under wound vac: Moisten w/normal saline or sterile water; Cover wound as directed. Do not remove from wound bed. Secondary Dressing: (NON-BORDER) Zetuvit Plus Silicone NON-BORDER 5x5 (in/in) 1 x Per Day/15 Days Discharge Instructions: Please do not put silicone bordered dressings under wraps. Use non-bordered dressing only under wraps. Secured With: ACE WRAP - 74M ACE Elastic Bandage With VELCRO Brand Closure, 4 (in) 1 x Per Day/15 Days Discharge Instructions: PRN to keep dressing in place Secured With: MOliverSurgical Tape, 2x2 (in/yd) 1 x Per Day/15 Days Secured With: Kerlix Roll Sterile or Non-Sterile 6-ply 4.5x4 (yd/yd) 1 x Per Day/15 Days Discharge Instructions: Apply Kerlix as directed Wound #2 - Amputation Site - Below Knee Wound Laterality: Left, Medial Cleanser: Dakin 16 (oz) 0.25 3 x Per Week/30 Days Discharge Instructions: Use as directed. Cleanser: Soap and Water 3 x Per Week/30 Days Discharge Instructions: Gently cleanse wound with antibacterial soap, rinse and pat dry prior to dressing  wounds Cleanser: Wound Cleanser 3 x Per Week/30 Days Discharge Instructions: Wash your hands with soap and water. Remove old dressing, discard into plastic bag and place into trash. Cleanse the wound with Wound Cleanser prior to applying a clean dressing using gauze sponges, not tissues or cotton balls. Do not scrub or use excessive force. Pat dry using gauze sponges, not tissue or cotton balls. Primary Dressing: Prisma 4.34 (in) 3 x Per Week/30 Days  Discharge Instructions: Under Wound Vac: Moisten w/normal saline or sterile water; Cover wound as directed. Do not remove from wound bed. Please change this when wound vac changed Secondary Dressing: (NON-BORDER) Zetuvit Plus Silicone NON-BORDER 5x5 (in/in) 3 x Per Week/30 Days Discharge Instructions: Please do not put silicone bordered dressings under wraps. Use non-bordered dressing only under wraps. Secured With: ACE WRAP - 37M ACE Elastic Bandage With VELCRO Brand Closure, 4 (in) 3 x Per Week/30 Days Secured With: Medipore Tape - 37M Medipore H Soft Cloth Surgical Tape, 2x2 (in/yd) 3 x Per Week/30 Days Secured With: [employer] or Non-Sterile 6-ply 4.5x4 (yd/yd) 3 x Per Week/30 Days Discharge Instructions: Apply Kerlix as directed Electronic Signature(s) Signed: 10/05/2021 3:50:43 PM By: Kalman Shan DO Entered By: Kalman Shan on 10/05/2021 15:50:24 [name] ([id number]) -------------------------------------------------------------------------------- Problem List Details Patient Name: [name] Date of Service: 10/05/2021 2:30 PM Medical Record Number: [id number] Patient Account Number: [id number] Date of Birth/Sex: Jun 14, 1960 (61 y.o. 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 M) Treating RN: Cornell Barman Primary Care Provider: Derinda Late Other Clinician: Referring Provider: Derinda Late Treating Provider/Extender: Yaakov Guthrie in Treatment: 33 Subjective Chief Complaint Information obtained from Patient Left lower extremity amputation site wound History of Present Illness (HPI) Admission 05/25/2021 [name] is a 61 year old male with a past medical history of uncontrolled type 1 diabetes on insulin with last hemoglobin A1c of 8.8, peripheral arterial disease, status post left BKA that presents to the clinic for a left BKA amputation site wound. Patient developed a diabetic foot infection on 04/01/2021 and ultimately underwent a left BKA on 04/12/2021. The wound site became infected and he underwent an IandD on 05/07/2021. The wound was left open at that time. He has been using wet-to-dry dressings for the past 3 weeks. He currently denies Systemic signs of infection. 1/25; patient presents for follow-up. He has been using Dakin's wet-to-dry dressings with improvement in wound healing. He has no  issues or complaints today. He denies signs of infection. 2/8; Patient presents for follow-up. He continues To use Dakin's wet-to-dry dressings. He has no issues or complaints today. He denies signs of infection. He follows up with his surgeon next week. 2/22; patient presents for follow-up. He continues to use Dakin's wet-to-dry dressings. He denies signs of infection. He saw his vascular surgeon on 06/24/2021. They took out the staples. Nothing further to report on. 3/8; patient presents for follow-up. He has been using Dakin's wet-to-dry dressings without issues. He denies signs of infection. 3/22; patient presents for follow-up. She has been using the wound vac without any issues. She states that in the past 2 days she has had  more tenderness to the wound bed. She denies signs of infection. 4/5; patient presents for follow-up. He has been using the wound VAC without issues. He has noticed improvement in wound healing. 4/19; patient presents for follow-up. He continues to use the wound VAC without issues. 5/3; patient presents for follow-up. He continues to use the wound VAC without issues. He has no complaints today. 5/17; patient presents for follow-up. He has been using the wound VAC without issues. There is a small open wound more medial to the original wound that does tunnel slightly. He currently denies signs of infection. 5/24; patient presents for follow-up. Earlier in the week home health called to let us know that there was yellow-green drainage in the canister of the wound VAC. He has since stopped the wound VAC and has been using Dakin's wet-to-dry dressings. 5/31; patient presents for follow-up. He tolerated the wound VAC well over the past week. He has been taking ciprofloxacin with improvement to yellow-green drainage. He also states that the small tunneled wound is not sore anymore. Objective Constitutional Vitals Time Taken: 2:31 PM, Holder: 71 in, Weight: 250 lbs, BMI: 34.9,  Temperature: 98.4 F, Pulse: 83 bpm, Respiratory Rate: 18 breaths/min, Blood Pressure: 138/75 mmHg. General Notes: Left BKA with wound at the amputation site with granulation tissue and slough. No undermining noted. Overall appears well- healing. Medial to this wound there is a small open area that tunnels. Integumentary (Hair, Skin) Wound #1 status is Open. Original cause of wound was Surgical Injury. The date acquired was: 04/12/2021. The wound has been in treatment 19 weeks. The wound is located on the Left Amputation Site - Below Knee. The wound measures 1cm length x 2.4cm width x 0.4cm depth; 1.885cm^2 area and 0.754cm^3 volume. There is Fat Layer (Subcutaneous Tissue) exposed. There is a large amount of serosanguineous [name], [name]. ([id number]) drainage noted. There is large (67-100%) red, hyper - granulation within the wound bed. There is a small (1-33%) amount of necrotic tissue within the wound bed including Adherent Slough. Wound #2 status is Open. Original cause of wound was Gradually Appeared. The date acquired was: 09/21/2021. The wound has been in treatment 1 weeks. The wound is located on the Left,Medial Amputation Site - Below Knee. The wound measures 0.5cm length x 0.6cm width x 1.2cm depth; 0.236cm^2 area and 0.283cm^3 volume. There is Fat Layer (Subcutaneous Tissue) exposed. There is a medium amount of serosanguineous drainage noted. There is medium (34-66%) red granulation within the wound bed. There is a medium (34-66%) amount of necrotic tissue within the wound bed including Adherent Slough. 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 The tunneled wound is stable however there is no more green/yellow drainage to any of the dressings. I debrided nonviable tissue. I recommended continuing the wound VAC but now we will add collagen to both wound beds. No signs of infection on exam. Home health comes out and changes the wound VAC in the week. Follow-up in 2 weeks. Procedures Wound #1 Pre-procedure diagnosis of Wound #1 is an Open Surgical Wound located on the Left Amputation Site - Below Knee .Severity of Tissue Pre Debridement is: Fat layer exposed. There was a Excisional  Skin/Subcutaneous Tissue Debridement with a total area of 2.4 sq cm performed by Kalman Shan, MD. With the following instrument(s): Curette to remove Viable tissue/material. Material removed includes Subcutaneous Tissue and Slough and. No specimens were taken. A time out was conducted at 14:54, prior to the start of the procedure. A Minimum amount of bleeding was controlled with Pressure. The procedure was tolerated well. Post Debridement Measurements: 1cm length x 2.4cm width x 0.5cm depth; 0.942cm^3 volume. Character of Wound/Ulcer Post Debridement is stable. Severity of Tissue Post Debridement is: Fat layer exposed. Post procedure Diagnosis Wound #1: Same as Pre-Procedure Plan Follow-up Appointments: Return Appointment in 1 week. Home Health: [place] only foam ok to use with wound [place] for wound care. May utilize formulary equivalent dressing for wound treatment orders unless otherwise specified. Home Health Nurse may visit PRN to address patient s wound care needs. - CONTACT [facility] IF ANY PROBLEMS OR QUESTIONS OR CONCERNS OF USE Scheduled days for dressing changes to be completed; exception, patient has scheduled wound care visit that day. **Please direct any NON-WOUND related issues/requests for orders to patient's Primary Care Physician. **If current dressing  causes regression in wound condition, may D/C ordered dressing product/s and apply Normal Saline Moist Dressing daily until next [place] or Other MD appointment. **Notify [facility] of regression in wound condition at [phone]. Bathing/ Shower/ Hygiene: No tub bath. Anesthetic (Use 'Patient Medications' Section for Anesthetic Order Entry): Benzocaine applied to wound bed. Edema Control - Lymphedema / Segmental Compressive Device / Other: DO YOUR BEST to sleep in the bed at night. DO NOT sleep in your recliner. Long hours of sitting in a recliner leads to swelling of the legs and/or potential wounds on your backside. Additional Orders / Instructions: Follow Nutritious Diet and Increase Protein Intake - monitor blood sugar to promote wound healing Other: - Keep appointment with surgeon and PM and R after healed for prostheis as they had scheduled Negative Pressure Wound Therapy: Wound VAC settings at 110mHg continuous pressure. Use foam to wound cavity. Please order WHITE foam to fill any tunnel/s and/or undermining when necessary. Change VAC dressing 3 X WEEK. Change canister as indicated when full. - Make sure wound vac foam is in contact [name], [name] ([id number] with the complete wound service to prevent the slough formation on edges-wet to dry dressing applied at wound appt [facility] may d/c VAC for s/s of increased infection, significant wound regression, or uncontrolled drainage. [place]at [phone] Number of foam/gauze pieces used in the dressing = Other: - Dakins wet to dry applied at wound care visit [facility] wound [facility] WOUND #1: - Amputation Site - Below Knee Wound Laterality: Left Cleanser: Dakin 16 (oz) 0.25 1 x Per Day/15 Days Discharge Instructions: Use as directed. Cleanser: Normal Saline 1 x Per DEPP/29Days Discharge Instructions: Wash your hands with soap and water.  Remove old dressing, discard into plastic bag and place into trash. Cleanse the wound with Normal Saline prior to applying a clean dressing using gauze sponges, not tissues or cotton balls. Do not scrub or use excessive force. Pat dry using gauze sponges, not tissue or cotton balls. Cleanser: Wound Cleanser 1 x Per Day/15 Days Discharge Instructions: Wash your hands with soap and water. Remove old dressing, discard into plastic bag and place into trash. Cleanse the wound with Wound Cleanser prior to applying a clean dressing  using gauze sponges, not tissues or cotton balls. Do not scrub or use excessive force. Pat dry using gauze sponges, not tissue or cotton balls. Primary Dressing: Gauze 1 x Per SEG/31 Days Discharge Instructions: In office: moistened with Dakins Solution Primary Dressing: Prisma 4.34 (in) 1 x Per Day/15 Days Discharge Instructions: Under wound vac: Moisten w/normal saline or sterile water; Cover wound as directed. Do not remove from wound bed. Secondary Dressing: (NON-BORDER) Zetuvit Plus Silicone NON-BORDER 5x5 (in/in) 1 x Per Day/15 Days Discharge Instructions: Please do not put silicone bordered dressings under wraps. Use non-bordered dressing only under wraps. Secured With: ACE WRAP - 52M ACE Elastic Bandage With VELCRO Brand Closure, 4 (in) 1 x Per Day/15 Days Discharge Instructions: PRN to keep dressing in place Secured With: [place] [employer] Tape, 2x2 (in/yd) 1 x Per Day/15 Days Secured With: Kerlix Roll Sterile or Non-Sterile 6-ply 4.5x4 (yd/yd) 1 x Per Day/15 Days Discharge Instructions: Apply Kerlix as directed WOUND #2: - Amputation Site - Below Knee Wound Laterality: Left, Medial Cleanser: Dakin 16 (oz) 0.25 3 x Per Week/30 Days Discharge Instructions: Use as directed. Cleanser: Soap and Water 3 x Per Week/30 Days Discharge Instructions: Gently cleanse wound with antibacterial soap, rinse and pat dry prior to dressing  wounds Cleanser: Wound Cleanser 3 x Per Week/30 Days Discharge Instructions: Wash your hands with soap and water. Remove old dressing, discard into plastic bag and place into trash. Cleanse the wound with Wound Cleanser prior to applying a clean dressing using gauze sponges, not tissues or cotton balls. Do not scrub or use excessive force. Pat dry using gauze sponges, not tissue or cotton balls. Primary Dressing: Prisma 4.34 (in) 3 x Per Week/30 Days Discharge Instructions: Under Wound Vac: Moisten w/normal saline or sterile water; Cover wound as directed. Do not remove from wound bed. Please change this when wound vac changed Secondary Dressing: (NON-BORDER) Zetuvit Plus Silicone NON-BORDER 5x5 (in/in) 3 x Per Week/30 Days Discharge Instructions: Please do not put silicone bordered dressings under wraps. Use non-bordered dressing only under wraps. Secured With: ACE WRAP - 52M ACE Elastic Bandage With VELCRO Brand Closure, 4 (in) 3 x Per Week/30 Days Secured With: Medipore Tape - 52M Medipore H Soft Cloth Surgical Tape, 2x2 (in/yd) 3 x Per Week/30 Days Secured With: [employer] Sterile or Non-Sterile 6-ply 4.5x4 (yd/yd) 3 x Per Week/30 Days Discharge Instructions: Apply Kerlix as directed 1. In office sharp debridement 2. Wound VAC with collagen 3. Dakin's wet-to-dry by the time the wound VAC is replaced 4. Follow-up in 2 weeks Electronic Signature(s) Signed: 10/05/2021 3:50:43 PM By: Kalman Shan DO Entered By: Kalman Shan on 10/05/2021 15:49:49 [name] ([id number]) -------------------------------------------------------------------------------- SuperBill Details Patient Name: [name] Date of Service: 10/05/2021 Medical Record Number: [id number] Patient Account Number: [id number] Date of Birth/Sex: 03-21-1961 (61 y.o. 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 Facility Procedures CPT4: Description Modifier Quantity Code [id number] 11042 - DEB SUBQ TISSUE 20 SQ CM/< 1 CPT4: ICD-10 Diagnosis Description L97.826 Non-pressure chronic ulcer of other part of left lower  leg with bone involvement without evidence of necrosis E10.622 Type 1 diabetes mellitus with other skin ulcer Physician Procedures CPT4: Description Modifier Quantity Code [id number] 62952 - WC PHYS SUBQ TISS 20 SQ CM 1 CPT4: ICD-10 Diagnosis Description 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

## 2021-10-06 NOTE — Progress Notes (Signed)
[name] ([id number]) Visit Report for 10/05/2021 Arrival Information Details Patient Name: [name], [name]. Date of Service: 10/05/2021 2:30 PM Medical Record Number: [id number] Patient Account Number: [id number] Date of Birth/Sex: 1961/03/02 (61 y.o. M) Treating RN: Cornell Barman Primary Care Verdelle Valtierra: Derinda Late Other Clinician: Referring Virgilene Stryker: Derinda Late Treating Ameliah Baskins/Extender: Yaakov Guthrie in Treatment: 76 Visit Information History Since Last Visit Added or deleted any medications: No Patient Arrived: Wheel Chair Has Dressing in Place as Prescribed: Yes Arrival Time: 14:30 Has Compression in Place as Prescribed: Yes Accompanied By: mom Pain Present Now: No Transfer Assistance: None Patient Identification Verified: Yes Secondary Verification Process Completed: Yes Patient Requires Transmission-Based No Precautions: Patient Has Alerts: Yes Patient Alerts: Patient on Blood Thinner Eliquis Diabetic Electronic Signature(s) Signed: 10/06/2021 9:49:48 AM By: Gretta Cool, BSN, RN, CWS, Kim RN, BSN Entered By: Gretta Cool, BSN, RN, CWS, Kim on 10/05/2021 14:31:14 [name] ([id number]) -------------------------------------------------------------------------------- Encounter Discharge Information Details Patient Name: [name] Date of Service: 10/05/2021 2:30 PM Medical Record Number: [id number] Patient Account Number: [id number] Date of Birth/Sex: 1961/02/19 (61 y.o. M) Treating RN: Cornell Barman Primary Care Deontre Allsup: Derinda Late Other Clinician: Referring Nichollas Perusse: Derinda Late Treating Jozi Malachi/Extender: Yaakov Guthrie in Treatment: 6 Encounter Discharge Information Items Post Procedure Vitals Discharge Condition: Stable Temperature (F): 98.1 Ambulatory Status: Wheelchair Pulse (bpm): 88 Discharge Destination: Home Respiratory Rate (breaths/min): 16 Transportation: Private Auto Blood Pressure (mmHg): 138/75 Accompanied By:  mom Schedule Follow-up Appointment: No Clinical Summary of Care: Electronic Signature(s) Signed: 10/06/2021 9:49:48 AM By: Gretta Cool, BSN, RN, CWS, Kim RN, BSN Entered By: Gretta Cool, BSN, RN, CWS, Kim on 10/05/2021 15:17:49 [name] ([id number]) -------------------------------------------------------------------------------- Lower Extremity Assessment Details Patient Name: [name]. Date of Service: 10/05/2021 2:30 PM Medical Record Number: [id number] Patient Account Number: [id number] Date of Birth/Sex: December 29, 1960 (61 y.o. M) Treating RN: Cornell Barman Primary Care Summit Arroyave: Derinda Late Other Clinician: Referring Roshonda Sperl: Derinda Late Treating Doloras Tellado/Extender: Yaakov Guthrie in Treatment: 75 Notes Patient has left BKA. Electronic Signature(s) Signed: 10/06/2021 9:49:48 AM By: Gretta Cool, BSN, RN, CWS, Kim RN, BSN Entered By: Gretta Cool, BSN, RN, CWS, Kim on 10/05/2021 14:47:22 [name] ([id number]) -------------------------------------------------------------------------------- Multi Wound Chart Details Patient Name: [name] Date of Service: 10/05/2021 2:30 PM Medical Record Number: [id number] Patient Account Number: [id number] Date of Birth/Sex: 09/17/1960 (61 y.o. M) Treating RN: Cornell Barman Primary Care Clebert Wenger: Derinda Late Other Clinician: Referring Arwa Yero: Derinda Late Treating Alyha Marines/Extender: Yaakov Guthrie in Treatment: 58 Vital Signs Holder(in): 71 Pulse(bpm): 69 Weight(lbs): 250 Blood Pressure(mmHg): 138/75 Body Mass Index(BMI): 34.9 Temperature(F): 98.4 Respiratory Rate(breaths/min): 18 Photos: [N/A:N/A] Wound Location: Left Amputation Site - Below Knee Left, Medial Amputation Site - N/A Below Knee Wounding Event: Surgical Injury Gradually Appeared N/A Primary Etiology: Open Surgical Wound To be determined N/A Secondary Etiology: Diabetic Wound/Ulcer of the Lower N/A N/A Extremity Comorbid History: Coronary Artery  Disease, Coronary Artery Disease, N/A Hypertension, Type I Diabetes Hypertension, Type I Diabetes Date Acquired: 04/12/2021 09/21/2021 N/A Weeks of Treatment: 19 1 N/A Wound Status: Open Open N/A Wound Recurrence: No No N/A Pending Amputation on Yes No N/A Presentation: Measurements L x W x D (cm) 1x2.4x0.4 0.5x0.6x1.2 N/A Area (cm) : 1.885 0.236 N/A Volume (cm) : 0.754 0.283 N/A % Reduction in Area: 97.30% -20.40% N/A % Reduction in Volume: 99.60% -80.30% N/A Classification: Full Thickness With Exposed Full Thickness Without Exposed N/A Support Structures Support Structures Exudate Amount: Large Medium N/A Exudate Type: Serosanguineous Serosanguineous N/A Exudate Color: red, brown red,  brown N/A Granulation Amount: Large (67-100%) Medium (34-66%) N/A Granulation Quality: Red, Hyper-granulation Red N/A Necrotic Amount: Small (1-33%) Medium (34-66%) N/A Exposed Structures: Fat Layer (Subcutaneous Tissue): Fat Layer (Subcutaneous Tissue): N/A Yes Yes Fascia: No Tendon: No Muscle: No Joint: No Bone: No Epithelialization: Small (1-33%) None N/A Treatment Notes Electronic Signature(s) Signed: 10/06/2021 9:49:48 AM By: Gretta Cool, BSN, RN, CWS, Kim RN, BSN [address] [place] ([id number]) Entered By: Gretta Cool, BSN, RN, CWS, Kim on 10/05/2021 14:53:48 [name] ([id number]) -------------------------------------------------------------------------------- [place] Details Patient Name: [name]. Date of Service: 10/05/2021 2:30 PM Medical Record Number: [id number] Patient Account Number: [id number] Date of Birth/Sex: October 20, 1960 (61 y.o. M) Treating RN: Cornell Barman Primary Care Deitra Craine: Derinda Late Other Clinician: Referring Gerron Guidotti: Derinda Late Treating Glenyce Randle/Extender: Yaakov Guthrie in Treatment: 64 Active Inactive Necrotic Tissue Nursing Diagnoses: Impaired tissue integrity related to necrotic/devitalized tissue Knowledge deficit related  to management of necrotic/devitalized tissue Goals: Necrotic/devitalized tissue will be minimized in the wound bed Date Initiated: 10/05/2021 Target Resolution Date: 10/05/2021 Goal Status: Active Patient/caregiver will verbalize understanding of reason and process for debridement of necrotic tissue Date Initiated: 10/05/2021 Target Resolution Date: 10/05/2021 Goal Status: Active Interventions: Assess patient pain level pre-, during and post procedure and prior to discharge Provide education on necrotic tissue and debridement process Notes: Wound/Skin Impairment Nursing Diagnoses: Impaired tissue integrity Knowledge deficit related to smoking impact on wound healing Knowledge deficit related to ulceration/compromised skin integrity Goals: Patient/caregiver will verbalize understanding of skin care regimen Date Initiated: 05/25/2021 Date Inactivated: 06/15/2021 Target Resolution Date: 07/04/2021 Goal Status: Met Ulcer/skin breakdown will have a volume reduction of 30% by week 4 Date Initiated: 05/25/2021 Date Inactivated: 06/29/2021 Target Resolution Date: 06/22/2021 Goal Status: Met Ulcer/skin breakdown will have a volume reduction of 50% by week 8 Date Initiated: 05/25/2021 Date Inactivated: 07/13/2021 Target Resolution Date: 07/20/2021 Goal Status: Met Ulcer/skin breakdown will have a volume reduction of 80% by week 12 Date Initiated: 05/25/2021 Target Resolution Date: 08/10/2021 Goal Status: Active Ulcer/skin breakdown will heal within 14 weeks Date Initiated: 05/25/2021 Target Resolution Date: 09/07/2021 Goal Status: Active Interventions: Assess patient/caregiver ability to obtain necessary supplies Assess patient/caregiver ability to perform ulcer/skin care regimen upon admission and as needed Assess ulceration(s) every visit Notes: Electronic Signature(s) WIRT, HEMMERICH ([id number]) Signed: 10/06/2021 9:49:48 AM By: Gretta Cool, BSN, RN, CWS, Kim RN, BSN Entered By: Gretta Cool, BSN, RN, CWS,  Kim on 10/05/2021 14:53:37 [name] ([id number]) -------------------------------------------------------------------------------- Pain Assessment Details Patient Name: TEVIS, DUNAVAN. Date of Service: 10/05/2021 2:30 PM Medical Record Number: [id number] Patient Account Number: [id number] Date of Birth/Sex: [birth year] (61 y.o. M) Treating RN: Cornell Barman Primary Care Sheriff Rodenberg: Derinda Late Other Clinician: Referring Anab Vivar: Derinda Late Treating Avarey Yaeger/Extender: Yaakov Guthrie in Treatment: 50 Active Problems Location of Pain Severity and Description of Pain Patient Has Paino No Site Locations Pain Management and Medication Current Pain Management: Electronic Signature(s) Signed: 10/06/2021 9:49:48 AM By: Gretta Cool, BSN, RN, CWS, Kim RN, BSN Entered By: Gretta Cool, BSN, RN, CWS, Kim on 10/05/2021 14:32:06 [name] ([id number]) -------------------------------------------------------------------------------- Patient/Caregiver Education Details Patient Name: [name] Date of Service: 10/05/2021 2:30 PM Medical Record Number: [id number] Patient Account Number: [id number] Date of Birth/Gender: Dec 15, 1960 (62 y.o. M) Treating RN: Cornell Barman Primary Care Physician: Derinda Late Other Clinician: Referring Physician: Derinda Late Treating Physician/Extender: Yaakov Guthrie in Treatment: 13 Education Assessment Education Provided To: Patient Education Topics Provided Wound Debridement: Handouts: Wound Debridement Methods: Demonstration, Explain/Verbal Responses: State content correctly Electronic Signature(s) Signed:  10/06/2021 9:49:48 AM By: Gretta Cool, BSN, RN, CWS, Kim RN, BSN Entered By: Gretta Cool, BSN, RN, CWS, Kim on 10/05/2021 15:16:17 [name] ([id number]) -------------------------------------------------------------------------------- Wound Assessment Details Patient Name: NICKOLAS, CHALFIN Date of Service: 10/05/2021 2:30 PM Medical Record  Number: [id number] Patient Account Number: [id number] Date of Birth/Sex: 01-19-1961 (61 y.o. M) Treating RN: Cornell Barman Primary Care Nochum Fenter: Derinda Late Other Clinician: Referring Harriet Bollen: Derinda Late Treating Dianne Whelchel/Extender: Yaakov Guthrie in Treatment: 19 Wound Status Wound Number: 1 Primary Etiology: Open Surgical Wound Wound Location: Left Amputation Site - Below Knee Secondary Diabetic Wound/Ulcer of the Lower Extremity Etiology: Wounding Event: Surgical Injury Wound Status: Open Date Acquired: 04/12/2021 Comorbid History: Coronary Artery Disease, Hypertension, Type I Weeks Of Treatment: 19 Diabetes Clustered Wound: No Pending Amputation On Presentation Photos Wound Measurements Length: (cm) 1 Width: (cm) 2.4 Depth: (cm) 0.4 Area: (cm) 1.885 Volume: (cm) 0.754 % Reduction in Area: 97.3% % Reduction in Volume: 99.6% Epithelialization: Small (1-33%) Wound Description Classification: Full Thickness With Exposed Support Structure Exudate Amount: Large Exudate Type: Serosanguineous Exudate Color: red, brown s Foul Odor After Cleansing: No Slough/Fibrino Yes Wound Bed Granulation Amount: Large (67-100%) Exposed Structure Granulation Quality: Red, Hyper-granulation Fascia Exposed: No Necrotic Amount: Small (1-33%) Fat Layer (Subcutaneous Tissue) Exposed: Yes Necrotic Quality: Adherent Slough Tendon Exposed: No Muscle Exposed: No Joint Exposed: No Bone Exposed: No Treatment Notes Wound #1 (Amputation Site - Below Knee) Wound Laterality: Left Cleanser Dakin 16 (oz) 0.25 Discharge Instruction: Use as directed. Normal Saline CHIPPER, KOUDELKA ([id number]) Discharge Instruction: Wash your hands with soap and water. Remove old dressing, discard into plastic bag and place into trash. Cleanse the wound with Normal Saline prior to applying a clean dressing using gauze sponges, not tissues or cotton balls. Do not scrub or use excessive force. Pat dry  using gauze sponges, not tissue or cotton balls. Wound Cleanser Discharge Instruction: Wash your hands with soap and water. Remove old dressing, discard into plastic bag and place into trash. Cleanse the wound with Wound Cleanser prior to applying a clean dressing using gauze sponges, not tissues or cotton balls. Do not scrub or use excessive force. Pat dry using gauze sponges, not tissue or cotton balls. Peri-Wound Care Topical Primary Dressing Gauze Discharge Instruction: In office: moistened with Dakins Solution Prisma 4.34 (in) Discharge Instruction: Under wound vac: Moisten w/normal saline or sterile water; Cover wound as directed. Do not remove from wound bed. Secondary Dressing (NON-BORDER) Zetuvit Plus Silicone NON-BORDER 5x5 (in/in) Discharge Instruction: Please do not put silicone bordered dressings under wraps. Use non-bordered dressing only under wraps. Secured With ACE WRAP - 29M ACE Elastic Bandage With VELCRO Brand Closure, 4 (in) Discharge Instruction: PRN to keep dressing in place [place] [employer] Tape, 2x2 (in/yd) Kerlix Roll Sterile or Non-Sterile 6-ply 4.5x4 (yd/yd) Discharge Instruction: Apply Kerlix as directed Compression Wrap Compression Stockings Add-Ons Electronic Signature(s) Signed: 10/06/2021 9:49:48 AM By: Gretta Cool, BSN, RN, CWS, Kim RN, BSN Entered By: Gretta Cool, BSN, RN, CWS, Kim on 10/05/2021 14:44:55 [name] ([id number]) -------------------------------------------------------------------------------- Wound Assessment Details Patient Name: PHINEAS, MCENROE. Date of Service: 10/05/2021 2:30 PM Medical Record Number: [id number] Patient Account Number: [id number] Date of Birth/Sex: 05/11/1960 (61 y.o. M) Treating RN: Cornell Barman Primary Care [name] [name]: Derinda Late Other Clinician: Referring Mailen Newborn: Derinda Late Treating Bryson Gavia/Extender: Yaakov Guthrie in Treatment: 19 Wound Status Wound Number: 2 Primary  Etiology: To be determined Wound Location: Left, Medial Amputation Site - Below Knee Wound Status:  Open Wounding Event: Gradually Appeared Comorbid Coronary Artery Disease, Hypertension, Type I History: Diabetes Date Acquired: 09/21/2021 Weeks Of Treatment: 1 Clustered Wound: No Photos Wound Measurements Length: (cm) 0.5 Width: (cm) 0.6 Depth: (cm) 1.2 Area: (cm) 0.236 Volume: (cm) 0.283 % Reduction in Area: -20.4% % Reduction in Volume: -80.3% Epithelialization: None Wound Description Classification: Full Thickness Without Exposed Support Structures Exudate Amount: Medium Exudate Type: Serosanguineous Exudate Color: red, brown Foul Odor After Cleansing: No Slough/Fibrino Yes Wound Bed Granulation Amount: Medium (34-66%) Exposed Structure Granulation Quality: Red Fat Layer (Subcutaneous Tissue) Exposed: Yes Necrotic Amount: Medium (34-66%) Necrotic Quality: Adherent Slough Treatment Notes Wound #2 (Amputation Site - Below Knee) Wound Laterality: Left, Medial Cleanser Dakin 16 (oz) 0.25 Discharge Instruction: Use as directed. Soap and Water Discharge Instruction: Gently cleanse wound with antibacterial soap, rinse and pat dry prior to dressing wounds Wound Cleanser Discharge Instruction: Wash your hands with soap and water. Remove old dressing, discard into plastic bag and place into trash. Cleanse the wound with Wound Cleanser prior to applying a clean dressing using gauze sponges, not tissues or cotton balls. Do not EDIBERTO, SENS. ([id number]) scrub or use excessive force. Pat dry using gauze sponges, not tissue or cotton balls. Peri-Wound Care Topical Primary Dressing Prisma 4.34 (in) Discharge Instruction: Under Wound Vac: Moisten w/normal saline or sterile water; Cover wound as directed. Do not remove from wound bed. Please change this when wound vac changed Secondary Dressing (NON-BORDER) Zetuvit Plus Silicone NON-BORDER 5x5 (in/in) Discharge Instruction:  Please do not put silicone bordered dressings under wraps. Use non-bordered dressing only under wraps. Secured With ACE WRAP - 32M ACE Elastic Bandage With VELCRO Brand Closure, 4 (in) Medipore Tape - 32M Medipore H Soft Cloth Surgical Tape, 2x2 (in/yd) Kerlix Roll Sterile or Non-Sterile 6-ply 4.5x4 (yd/yd) Discharge Instruction: Apply Kerlix as directed Compression Wrap Compression Stockings Add-Ons Electronic Signature(s) Signed: 10/06/2021 9:49:48 AM By: Gretta Cool, BSN, RN, CWS, Kim RN, BSN Entered By: Gretta Cool, BSN, RN, CWS, Kim on 10/05/2021 14:42:17 [name] ([id number]) -------------------------------------------------------------------------------- Vitals Details Patient Name: [name] Date of Service: 10/05/2021 2:30 PM Medical Record Number: [id number] Patient Account Number: [id number] Date of Birth/Sex: 12-13-1960 (61 y.o. M) Treating RN: Cornell Barman Primary Care Kayelee Herbig: Derinda Late Other Clinician: Referring Gwendola Hornaday: Derinda Late Treating Elige Shouse/Extender: Yaakov Guthrie in Treatment: 17 Vital Signs Time Taken: 14:31 Temperature (F): 98.4 Holder (in): 71 Pulse (bpm): 83 Weight (lbs): 250 Respiratory Rate (breaths/min): 18 Body Mass Index (BMI): 34.9 Blood Pressure (mmHg): 138/75 Reference Range: 80 - 120 mg / dl Electronic Signature(s) Signed: 10/06/2021 9:49:48 AM By: Gretta Cool, BSN, RN, CWS, Kim RN, BSN Entered By: Gretta Cool, BSN, RN, CWS, Kim on 10/05/2021 14:32:01

## 2021-10-07 DIAGNOSIS — F32A Depression, unspecified: Secondary | ICD-10-CM | POA: Diagnosis not present

## 2021-10-07 DIAGNOSIS — I739 Peripheral vascular disease, unspecified: Secondary | ICD-10-CM | POA: Diagnosis not present

## 2021-10-07 DIAGNOSIS — Z4781 Encounter for orthopedic aftercare following surgical amputation: Secondary | ICD-10-CM | POA: Diagnosis not present

## 2021-10-07 DIAGNOSIS — Z7984 Long term (current) use of oral hypoglycemic drugs: Secondary | ICD-10-CM | POA: Diagnosis not present

## 2021-10-07 DIAGNOSIS — Z794 Long term (current) use of insulin: Secondary | ICD-10-CM | POA: Diagnosis not present

## 2021-10-07 DIAGNOSIS — Z89512 Acquired absence of left leg below knee: Secondary | ICD-10-CM | POA: Diagnosis not present

## 2021-10-07 DIAGNOSIS — F419 Anxiety disorder, unspecified: Secondary | ICD-10-CM | POA: Diagnosis not present

## 2021-10-07 DIAGNOSIS — I10 Essential (primary) hypertension: Secondary | ICD-10-CM | POA: Diagnosis not present

## 2021-10-07 DIAGNOSIS — T148XXD Other injury of unspecified body region, subsequent encounter: Secondary | ICD-10-CM | POA: Diagnosis not present

## 2021-10-07 DIAGNOSIS — E119 Type 2 diabetes mellitus without complications: Secondary | ICD-10-CM | POA: Diagnosis not present

## 2021-10-10 DIAGNOSIS — T148XXD Other injury of unspecified body region, subsequent encounter: Secondary | ICD-10-CM | POA: Diagnosis not present

## 2021-10-10 DIAGNOSIS — Z4781 Encounter for orthopedic aftercare following surgical amputation: Secondary | ICD-10-CM | POA: Diagnosis not present

## 2021-10-10 DIAGNOSIS — Z7984 Long term (current) use of oral hypoglycemic drugs: Secondary | ICD-10-CM | POA: Diagnosis not present

## 2021-10-10 DIAGNOSIS — E119 Type 2 diabetes mellitus without complications: Secondary | ICD-10-CM | POA: Diagnosis not present

## 2021-10-10 DIAGNOSIS — I10 Essential (primary) hypertension: Secondary | ICD-10-CM | POA: Diagnosis not present

## 2021-10-10 DIAGNOSIS — I739 Peripheral vascular disease, unspecified: Secondary | ICD-10-CM | POA: Diagnosis not present

## 2021-10-10 DIAGNOSIS — Z794 Long term (current) use of insulin: Secondary | ICD-10-CM | POA: Diagnosis not present

## 2021-10-12 ENCOUNTER — Ambulatory Visit: Payer: HMO | Admitting: Physician Assistant

## 2021-10-14 ENCOUNTER — Other Ambulatory Visit: Payer: Self-pay | Admitting: Psychiatry

## 2021-10-14 DIAGNOSIS — F331 Major depressive disorder, recurrent, moderate: Secondary | ICD-10-CM

## 2021-10-17 DIAGNOSIS — E1029 Type 1 diabetes mellitus with other diabetic kidney complication: Secondary | ICD-10-CM | POA: Diagnosis not present

## 2021-10-17 DIAGNOSIS — T8189XA Other complications of procedures, not elsewhere classified, initial encounter: Secondary | ICD-10-CM | POA: Diagnosis not present

## 2021-10-17 DIAGNOSIS — R809 Proteinuria, unspecified: Secondary | ICD-10-CM | POA: Diagnosis not present

## 2021-10-19 ENCOUNTER — Encounter: Payer: HMO | Attending: Internal Medicine | Admitting: Internal Medicine

## 2021-10-19 DIAGNOSIS — E1051 Type 1 diabetes mellitus with diabetic peripheral angiopathy without gangrene: Secondary | ICD-10-CM | POA: Insufficient documentation

## 2021-10-19 DIAGNOSIS — X58XXXA Exposure to other specified factors, initial encounter: Secondary | ICD-10-CM | POA: Diagnosis not present

## 2021-10-19 DIAGNOSIS — L97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: Secondary | ICD-10-CM | POA: Diagnosis not present

## 2021-10-19 DIAGNOSIS — E10622 Type 1 diabetes mellitus with other skin ulcer: Secondary | ICD-10-CM | POA: Insufficient documentation

## 2021-10-19 DIAGNOSIS — T8131XA Disruption of external operation (surgical) wound, not elsewhere classified, initial encounter: Secondary | ICD-10-CM | POA: Insufficient documentation

## 2021-10-19 DIAGNOSIS — Z89512 Acquired absence of left leg below knee: Secondary | ICD-10-CM | POA: Diagnosis not present

## 2021-10-19 DIAGNOSIS — Z430 Encounter for attention to tracheostomy: Secondary | ICD-10-CM | POA: Diagnosis not present

## 2021-10-19 NOTE — Progress Notes (Signed)
KARIM, AIELLO ([id number]) Visit Report for 10/19/2021 Arrival Information Details Patient Name: [name], [name]. Date of Service: 10/19/2021 2:30 PM Medical Record Number: [id number] Patient Account Number: [id number] Date of Birth/Sex: 25-Nov-1960 (61 y.o. M) Treating RN: Levora Dredge Primary Care Willette Mudry: Derinda Late Other Clinician: Referring Hurshel Bouillon: Derinda Late Treating Morrell Fluke/Extender: Yaakov Guthrie in Treatment: 21 Visit Information History Since Last Visit Added or deleted any medications: No Patient Arrived: Wheel Chair Any new allergies or adverse reactions: No Arrival Time: 14:49 Had a fall or experienced change in No Accompanied By: mother activities of daily living that may affect Transfer Assistance: EasyPivot Patient Lift risk of falls: Patient Identification Verified: Yes Hospitalized since last visit: No Secondary Verification Process Completed: Yes Has Dressing in Place as Prescribed: Yes Patient Requires Transmission-Based No Pain Present Now: No Precautions: Patient Has Alerts: Yes Patient Alerts: Patient on Blood Thinner Eliquis Diabetic Electronic Signature(s) Signed: 10/19/2021 4:18:56 PM By: Levora Dredge Entered By: Levora Dredge on 10/19/2021 14:52:56 [name] ([id number]) -------------------------------------------------------------------------------- Clinic Level of Care Assessment Details Patient Name: [name] Date of Service: 10/19/2021 2:30 PM Medical Record Number: [id number] Patient Account Number: [id number] Date of Birth/Sex: [birth year] (61 y.o. M) Treating RN: Levora Dredge Primary Care Saman Umstead: Derinda Late Other Clinician: Referring Emerie Vanderkolk: Derinda Late Treating Windsor Goeken/Extender: Yaakov Guthrie in Treatment: 21 Clinic Level of Care Assessment Items TOOL 1 Quantity Score '[]'  - Use when EandM and Procedure is performed on INITIAL visit 0 ASSESSMENTS - Nursing Assessment /  Reassessment '[]'  - General Physical Exam (combine w/ comprehensive assessment (listed just below) when performed on new 0 pt. evals) '[]'  - 0 Comprehensive Assessment (HX, ROS, Risk Assessments, Wounds Hx, etc.) ASSESSMENTS - Wound and Skin Assessment / Reassessment '[]'  - Dermatologic / Skin Assessment (not related to wound area) 0 ASSESSMENTS - Ostomy and/or Continence Assessment and Care '[]'  - Incontinence Assessment and Management 0 '[]'  - 0 Ostomy Care Assessment and Management (repouching, etc.) PROCESS - Coordination of Care '[]'  - Simple Patient / Family Education for ongoing care 0 '[]'  - 0 Complex (extensive) Patient / Family Education for ongoing care '[]'  - 0 Staff obtains Programmer, systems, Records, Test Results / Process Orders '[]'  - 0 Staff telephones HHA, Nursing Homes / Clarify orders / etc '[]'  - 0 Routine Transfer to another Facility (non-emergent condition) '[]'  - 0 Routine Hospital Admission (non-emergent condition) '[]'  - 0 New Admissions / Biomedical engineer / Ordering NPWT, Apligraf, etc. '[]'  - 0 Emergency Hospital Admission (emergent condition) PROCESS - Special Needs '[]'  - Pediatric / Minor Patient Management 0 '[]'  - 0 Isolation Patient Management '[]'  - 0 Hearing / Language / Visual special needs '[]'  - 0 Assessment of Community assistance (transportation, D/C planning, etc.) '[]'  - 0 Additional assistance / Altered mentation '[]'  - 0 Support Surface(s) Assessment (bed, cushion, seat, etc.) INTERVENTIONS - Miscellaneous '[]'  - External ear exam 0 '[]'  - 0 Patient Transfer (multiple staff / Civil Service fast streamer / Similar devices) '[]'  - 0 Simple Staple / Suture removal (25 or less) '[]'  - 0 Complex Staple / Suture removal (26 or more) '[]'  - 0 Hypo/Hyperglycemic Management (do not check if billed separately) '[]'  - 0 Ankle / Brachial Index (ABI) - do not check if billed separately Has the patient been seen at the hospital within the last three years: Yes Total Score: 0 Level Of Care:  ____ [name] ([id number]) Electronic Signature(s) Signed: 10/19/2021 4:18:56 PM By: Levora Dredge Entered By: Levora Dredge on 10/19/2021 16:03:21 Hagin, Kyley G. (  [id number]) -------------------------------------------------------------------------------- Encounter Discharge Information Details Patient Name: THOMSON, HERBERS. Date of Service: 10/19/2021 2:30 PM Medical Record Number: [id number] Patient Account Number: [id number] Date of Birth/Sex: February 12, 1961 (62 y.o. M) Treating RN: Levora Dredge Primary Care Chandrika Sandles: Derinda Late Other Clinician: Referring Corrigan Kretschmer: Derinda Late Treating Clarisse Rodriges/Extender: Yaakov Guthrie in Treatment: 21 Encounter Discharge Information Items Post Procedure Vitals Discharge Condition: Stable Temperature (F): 97.9 Ambulatory Status: Wheelchair Pulse (bpm): 74 Discharge Destination: Home Respiratory Rate (breaths/min): 18 Transportation: Private Auto Blood Pressure (mmHg): 132/78 Accompanied By: mother Schedule Follow-up Appointment: Yes Clinical Summary of Care: Electronic Signature(s) Signed: 10/19/2021 4:04:35 PM By: Levora Dredge Entered By: Levora Dredge on 10/19/2021 16:04:35 [name] ([id number]) -------------------------------------------------------------------------------- Lower Extremity Assessment Details Patient Name: [name] Date of Service: 10/19/2021 2:30 PM Medical Record Number: [id number] Patient Account Number: [id number] Date of Birth/Sex: 29-Oct-1960 (62 y.o. M) Treating RN: Levora Dredge Primary Care Stephens Shreve: Derinda Late Other Clinician: Referring Mattix Imhof: Derinda Late Treating Treyven Lafauci/Extender: Yaakov Guthrie in Treatment: 21 Electronic Signature(s) Signed: 10/19/2021 4:18:56 PM By: Levora Dredge Entered By: Levora Dredge on 10/19/2021 15:04:27 [name]  ([id number]) -------------------------------------------------------------------------------- Multi Wound Chart Details Patient Name: JESPER, STIREWALT. Date of Service: 10/19/2021 2:30 PM Medical Record Number: [id number] Patient Account Number: [id number] Date of Birth/Sex: 1960/07/31 (61 y.o. M) Treating RN: Levora Dredge Primary Care Tabita Corbo: Derinda Late Other Clinician: Referring Ilhan Madan: Derinda Late Treating Marra Fraga/Extender: Yaakov Guthrie in Treatment: 21 Vital Signs Height(in): 71 Pulse(bpm): 20 Weight(lbs): 250 Blood Pressure(mmHg): 132/78 Body Mass Index(BMI): 34.9 Temperature(F): 97.9 Respiratory Rate(breaths/min): 18 Photos: [N/A:N/A] Wound Location: Left Amputation Site - Below Knee Left, Medial Amputation Site - N/A Below Knee Wounding Event: Surgical Injury Gradually Appeared N/A Primary Etiology: Open Surgical Wound To be determined N/A Secondary Etiology: Diabetic Wound/Ulcer of the Lower N/A N/A Extremity Comorbid History: Coronary Artery Disease, Coronary Artery Disease, N/A Hypertension, Type I Diabetes Hypertension, Type I Diabetes Date Acquired: 04/12/2021 09/21/2021 N/A Weeks of Treatment: 21 3 N/A Wound Status: Open Open N/A Wound Recurrence: No No N/A Pending Amputation on Yes No N/A Presentation: Measurements L x W x D (cm) 0.9x2.2x0.2 0.1x0.2x0.1 N/A Area (cm) : 1.555 0.016 N/A Volume (cm) : 0.311 0.002 N/A % Reduction in Area: 97.80% 91.80% N/A % Reduction in Volume: 99.80% 98.70% N/A Classification: Full Thickness With Exposed Full Thickness Without Exposed N/A Support Structures Support Structures Exudate Amount: Large Medium N/A Exudate Type: Serosanguineous Serosanguineous N/A Exudate Color: red, brown red, brown N/A Granulation Amount: Medium (34-66%) Small (1-33%) N/A Granulation Quality: Red, Hyper-granulation Red N/A Necrotic Amount: Medium (34-66%) Medium (34-66%) N/A Exposed Structures: Fat Layer (Subcutaneous  Tissue): Fat Layer (Subcutaneous Tissue): N/A Yes Yes Fascia: No Tendon: No Muscle: No Joint: No Bone: No Epithelialization: Medium (34-66%) Small (1-33%) N/A Treatment Notes Electronic Signature(s) Signed: 10/19/2021 4:18:56 PM By: Jackelyn Poling ([id number]) Entered By: Levora Dredge on 10/19/2021 15:31:33 [name] ([id number]) -------------------------------------------------------------------------------- Multi-Disciplinary Care Plan Details Patient Name: NOELL, SHULAR. Date of Service: 10/19/2021 2:30 PM Medical Record Number: [id number] Patient Account Number: [id number] Date of Birth/Sex: Dec 30, 1960 (61 y.o. M) Treating RN: Levora Dredge Primary Care Jomes Giraldo: Derinda Late Other Clinician: Referring Destony Prevost: Derinda Late Treating Sharrie Self/Extender: Yaakov Guthrie in Treatment: 21 Active Inactive Necrotic Tissue Nursing Diagnoses: Impaired tissue integrity related to necrotic/devitalized tissue Knowledge deficit related to management of necrotic/devitalized tissue Goals: Necrotic/devitalized tissue will be minimized in the wound bed Date Initiated: 10/05/2021 Target Resolution Date: 10/05/2021 Goal Status: Active Patient/caregiver will verbalize understanding of  reason and process for debridement of necrotic tissue Date Initiated: 10/05/2021 Target Resolution Date: 10/05/2021 Goal Status: Active Interventions: Assess patient pain level pre-, during and post procedure and prior to discharge Provide education on necrotic tissue and debridement process Notes: Wound/Skin Impairment Nursing Diagnoses: Impaired tissue integrity Knowledge deficit related to smoking impact on wound healing Knowledge deficit related to ulceration/compromised skin integrity Goals: Patient/caregiver will verbalize understanding of skin care regimen Date Initiated: 05/25/2021 Date Inactivated: 06/15/2021 Target Resolution Date: 07/04/2021 Goal Status:  Met Ulcer/skin breakdown will have a volume reduction of 30% by week 4 Date Initiated: 05/25/2021 Date Inactivated: 06/29/2021 Target Resolution Date: 06/22/2021 Goal Status: Met Ulcer/skin breakdown will have a volume reduction of 50% by week 8 Date Initiated: 05/25/2021 Date Inactivated: 07/13/2021 Target Resolution Date: 07/20/2021 Goal Status: Met Ulcer/skin breakdown will have a volume reduction of 80% by week 12 Date Initiated: 05/25/2021 Target Resolution Date: 08/10/2021 Goal Status: Active Ulcer/skin breakdown will heal within 14 weeks Date Initiated: 05/25/2021 Target Resolution Date: 09/07/2021 Goal Status: Active Interventions: Assess patient/caregiver ability to obtain necessary supplies Assess patient/caregiver ability to perform ulcer/skin care regimen upon admission and as needed Assess ulceration(s) every visit Notes: Electronic Signature(s) KHING, BELCHER ([id number]) Signed: 10/19/2021 4:18:56 PM By: Levora Dredge Entered By: Levora Dredge on 10/19/2021 15:31:23 [name] ([id number]) -------------------------------------------------------------------------------- Pain Assessment Details Patient Name: [name] Date of Service: 10/19/2021 2:30 PM Medical Record Number: [id number] Patient Account Number: [id number] Date of Birth/Sex: 02/24/1961 (61 y.o. M) Treating RN: Levora Dredge Primary Care Mardel Grudzien: Derinda Late Other Clinician: Referring Caileen Veracruz: Derinda Late Treating Amayah Staheli/Extender: Yaakov Guthrie in Treatment: 21 Active Problems Location of Pain Severity and Description of Pain Patient Has Paino No Site Locations Rate the pain. Current Pain Level: 0 Pain Management and Medication Current Pain Management: Electronic Signature(s) Signed: 10/19/2021 4:18:56 PM By: Levora Dredge Entered By: Levora Dredge on 10/19/2021 14:54:54 [name]  ([id number]) -------------------------------------------------------------------------------- Patient/Caregiver Education Details Patient Name: [name] Date of Service: 10/19/2021 2:30 PM Medical Record Number: [id number] Patient Account Number: [id number] Date of Birth/Gender: 26-Oct-1960 (61 y.o. M) Treating RN: Levora Dredge Primary Care Physician: Derinda Late Other Clinician: Referring Physician: Derinda Late Treating Physician/Extender: Yaakov Guthrie in Treatment: 21 Education Assessment Education Provided To: Patient and Caregiver Education Topics Provided Wound Debridement: Handouts: Wound Debridement Methods: Explain/Verbal Responses: State content correctly Wound/Skin Impairment: Handouts: Caring for Your Ulcer Methods: Explain/Verbal Responses: State content correctly Electronic Signature(s) Signed: 10/19/2021 4:18:56 PM By: Levora Dredge Entered By: Levora Dredge on 10/19/2021 16:03:43 [name] ([id number]) -------------------------------------------------------------------------------- Wound Assessment Details Patient Name: [name] Date of Service: 10/19/2021 2:30 PM Medical Record Number: [id number] Patient Account Number: [id number] Date of Birth/Sex: December 19, 1960 (61 y.o. 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 Etiology: Wounding Event: Surgical Injury Wound Status: Open Date Acquired: 04/12/2021 Comorbid History: Coronary Artery Disease, Hypertension, Type I Weeks Of Treatment: 21 Diabetes Clustered Wound: No Pending Amputation On Presentation Photos Wound Measurements Length: (cm) 0.9 Width: (cm)  2.2 Depth: (cm) 0.2 Area: (cm) 1.555 Volume: (cm) 0.311 % Reduction in Area: 97.8% % Reduction in Volume: 99.8% Epithelialization: Medium (34-66%) Tunneling: No Undermining: No Wound Description Classification: Full Thickness With Exposed Support Structure Exudate Amount: Large Exudate Type: Serosanguineous Exudate Color:  red, brown s Foul Odor After Cleansing: No Slough/Fibrino Yes Wound Bed Granulation Amount: Medium (34-66%) Exposed Structure Granulation Quality: Red, Hyper-granulation Fascia Exposed: No Necrotic Amount: Medium (34-66%) Fat Layer (Subcutaneous Tissue) Exposed: Yes Necrotic Quality: Adherent Slough Tendon Exposed: No Muscle Exposed: No Joint Exposed: No Bone Exposed: No Treatment Notes Wound #1 (Amputation Site - Below Knee) Wound Laterality: Left Cleanser Dakin 16 (oz) 0.25 Discharge Instruction: Use as directed. for cleaning Normal Saline MESSIAH, AHR ([id number]) Discharge Instruction: Wash your hands with soap and water. Remove old dressing, discard into plastic bag and place into trash. Cleanse the wound with Normal Saline prior to applying a clean dressing using gauze sponges, not tissues or cotton balls. Do not scrub or use excessive force. Pat dry using gauze sponges, not tissue or cotton balls. Wound Cleanser Discharge Instruction: Wash your hands with soap and water. Remove old dressing, discard into plastic bag and place into trash. Cleanse the wound with Wound Cleanser prior to applying a clean dressing using gauze sponges, not tissues or cotton balls. Do not scrub or use excessive force. Pat dry using gauze sponges, not tissue or cotton balls. Peri-Wound Care Topical Primary Dressing Hydrofera Blue Ready Transfer Foam, 4x5 (in/in) Discharge Instruction: Apply Hydrofera Blue Ready to wound bed as directed Secondary Dressing (SILCONE BORDER) Zetuvit Plus SILICONE BORDER Dressing 5x5 (in/in) Discharge Instruction: Please do not put  silicone bordered dressings under wraps. Use non-bordered dressing only. Secured With Compression Wrap Compression Stockings Add-Ons Electronic Signature(s) Signed: 10/19/2021 4:18:56 PM By: Levora Dredge Entered By: Levora Dredge on 10/19/2021 15:03:41 [name] ([id number]) -------------------------------------------------------------------------------- Wound Assessment Details Patient Name: [name] Date of Service: 10/19/2021 2:30 PM Medical Record Number: [id number] Patient Account Number: [id number] Date of Birth/Sex: 12-29-1960 (61 y.o. M) Treating RN: Levora Dredge Primary Care Hilda Rynders: Derinda Late Other Clinician: Referring Tram Wrenn: Derinda Late Treating Xochitl Egle/Extender: Yaakov Guthrie in Treatment: 21 Wound Status Wound Number: 2 Primary Etiology: To be determined Wound Location: Left, Medial Amputation Site - Below Knee Wound Status: Open Wounding Event: Gradually Appeared Comorbid Coronary Artery Disease, Hypertension, Type I History: Diabetes Date Acquired: 09/21/2021 Weeks Of Treatment: 3 Clustered Wound: No Photos Wound Measurements Length: (cm) 0.1 Width: (cm) 0.2 Depth: (cm) 0.1 Area: (cm) 0.016 Volume: (cm) 0.002 % Reduction in Area: 91.8% % Reduction in Volume: 98.7% Epithelialization: Small (1-33%) Tunneling: No Undermining: No Wound Description Classification: Full Thickness Without Exposed Support Structures Exudate Amount: Medium Exudate Type: Serosanguineous Exudate Color: red, brown Foul Odor After Cleansing: No Slough/Fibrino Yes Wound Bed Granulation Amount: Small (1-33%) Exposed Structure Granulation Quality: Red Fat Layer (Subcutaneous Tissue) Exposed: Yes Necrotic Amount: Medium (34-66%) Necrotic Quality: Adherent Slough Treatment Notes Wound #2 (Amputation Site - Below Knee) Wound Laterality: Left, Medial Cleanser Dakin 16 (oz) 0.25 Discharge Instruction: Use as directed. for cleaning Normal  Saline Discharge Instruction: Wash your hands with soap and water. Remove old dressing, discard into plastic bag and place into trash. Cleanse the wound with Normal Saline prior to applying a clean dressing using gauze sponges, not tissues or cotton balls. Do not scrub or use excessive force. Pat dry using gauze sponges, not tissue or cotton balls. Wound Cleanser GERRY, [place] ([id number]) Discharge Instruction: Wash your hands with soap and water. Remove old dressing, discard into plastic bag and place into trash. Cleanse the wound with Wound Cleanser prior to applying a clean dressing using gauze sponges, not tissues or cotton balls. Do not scrub or use excessive force. Pat dry using gauze  sponges, not tissue or cotton balls. Peri-Wound Care Topical Primary Dressing Hydrofera Blue Ready Transfer Foam, 4x5 (in/in) Discharge Instruction: Apply Hydrofera Blue Ready to wound bed as directed Secondary Dressing (SILCONE BORDER) Zetuvit Plus SILICONE BORDER Dressing 5x5 (in/in) Discharge Instruction: Please do not put silicone bordered dressings under wraps. Use non-bordered dressing only. Secured With Compression Wrap Compression Stockings Add-Ons Electronic Signature(s) Signed: 10/19/2021 4:18:56 PM By: Levora Dredge Entered By: Levora Dredge on 10/19/2021 15:04:16 [name] ([id number]) -------------------------------------------------------------------------------- Vitals Details Patient Name: [name] Date of Service: 10/19/2021 2:30 PM Medical Record Number: [id number] Patient Account Number: [id number] Date of Birth/Sex: 25-Nov-1960 (61 y.o. M) Treating RN: Levora Dredge Primary Care Leticia Mcdiarmid: Derinda Late Other Clinician: Referring Leaman Abe: Derinda Late Treating Jarom Govan/Extender: Yaakov Guthrie in Treatment: 21 Vital Signs Time Taken: 14:52 Temperature (F): 97.9 Height (in): 71 Pulse (bpm): 74 Weight (lbs): 250 Respiratory Rate  (breaths/min): 18 Body Mass Index (BMI): 34.9 Blood Pressure (mmHg): 132/78 Reference Range: 80 - 120 mg / dl Electronic Signature(s) Signed: 10/19/2021 4:18:56 PM By: Levora Dredge Entered By: Levora Dredge on 10/19/2021 14:54:47

## 2021-10-19 NOTE — Progress Notes (Signed)
RAFFERTY, POSTLEWAIT ([id number]) Visit Report for 10/19/2021 Chief Complaint Document Details Patient Name: BARAKA, KLATT. Date of Service: 10/19/2021 2:30 PM Medical Record Number: [id number] Patient Account Number: [id number] Date of Birth/Sex: 12/31/1960 (61 y.o. M) Treating RN: Levora Dredge Primary Care Provider: Derinda Late Other Clinician: Referring Provider: Derinda Late Treating Provider/Extender: Yaakov Guthrie in Treatment: 21 Information Obtained from: Patient Chief Complaint Left lower extremity amputation site wound Electronic Signature(s) Signed: 10/19/2021 4:11:59 PM By: Kalman Shan DO Entered By: Kalman Shan on 10/19/2021 15:54:22 [name] ([id number]) -------------------------------------------------------------------------------- Debridement Details Patient Name: [name] Date of Service: 10/19/2021 2:30 PM Medical Record Number: [id number] Patient Account Number: [id number] Date of Birth/Sex: 01-20-1961 (61 y.o. M) Treating RN: Levora Dredge Primary Care Provider: Derinda Late Other Clinician: Referring Provider: Derinda Late Treating Provider/Extender: Yaakov Guthrie in Treatment: 21 Debridement Performed for Wound #1 Left Amputation Site - Below Knee Assessment: Performed By: Physician Kalman Shan, MD Debridement Type: Debridement Severity of Tissue Pre Debridement: Fat layer exposed Level of Consciousness (Pre- Awake and Alert procedure): Pre-procedure Verification/Time Out Yes - 15:30 Taken: Total Area Debrided (L x W): 0.9 (cm) x 2.2 (cm) = 1.98 (cm) Tissue and other material Viable, Non-Viable, Slough, Subcutaneous, Slough debrided: Level: Skin/Subcutaneous Tissue Debridement Description: Excisional Instrument: Curette Bleeding: Moderate Hemostasis Achieved: Pressure Response to Treatment: Procedure was tolerated well Level of Consciousness (Post- Awake and Alert procedure): Post  Debridement Measurements of Total Wound Length: (cm) 0.9 Width: (cm) 2.2 Depth: (cm) 0.2 Volume: (cm) 0.311 Character of Wound/Ulcer Post Debridement: Stable Severity of Tissue Post Debridement: Fat layer exposed Post Procedure Diagnosis Same as Pre-procedure Electronic Signature(s) Signed: 10/19/2021 4:11:59 PM By: Kalman Shan DO Signed: 10/19/2021 4:18:56 PM By: Levora Dredge Entered By: Levora Dredge on 10/19/2021 15:32:19 [name] ([id number]) -------------------------------------------------------------------------------- HPI Details Patient Name: [name] Date of Service: 10/19/2021 2:30 PM Medical Record Number: [id number] Patient Account Number: [id number] Date of Birth/Sex: [birth year] (61 y.o. M) Treating RN: Levora Dredge Primary Care Provider: Derinda Late Other Clinician: Referring Provider: Derinda Late Treating Provider/Extender: Yaakov Guthrie in Treatment: 21 History of Present Illness HPI Description: Admission 05/25/2021 Mr. Claudius Mich is a 61 year old male with a past medical history of uncontrolled type 1 diabetes on insulin with last hemoglobin A1c of 8.8, peripheral arterial disease, status post left BKA that presents to the clinic for a left BKA amputation site wound. Patient developed a diabetic foot infection on 04/01/2021 and ultimately underwent a left BKA on 04/12/2021. The wound site became infected and he underwent an IandD on 05/07/2021. The wound was left open at that time. He has been using wet-to-dry dressings for the past 3 weeks. He currently denies Systemic signs of infection. 1/25; patient presents for follow-up. He has been using Dakin's wet-to-dry dressings with improvement in wound healing. He has no issues or complaints today. He denies signs of infection. 2/8; Patient presents for follow-up. He continues To use Dakin's wet-to-dry dressings. He has no issues or complaints today. He denies signs of infection.  He follows up with his surgeon next week. 2/22; patient presents for follow-up. He continues to use Dakin's wet-to-dry dressings. He denies signs of infection. He saw his vascular surgeon on 06/24/2021. They took out the staples. Nothing further to report on. 3/8; patient presents for follow-up. He has been using Dakin's wet-to-dry dressings without issues. He denies signs of infection. 3/22; patient presents for follow-up. She has been using the wound vac without any issues. She states that  in the past 2 days she has had more tenderness to the wound bed. She denies signs of infection. 4/5; patient presents for follow-up. He has been using the wound VAC without issues. He has noticed improvement in wound healing. 4/19; patient presents for follow-up. He continues to use the wound VAC without issues. 5/3; patient presents for follow-up. He continues to use the wound VAC without issues. He has no complaints today. 5/17; patient presents for follow-up. He has been using the wound VAC without issues. There is a small open wound more medial to the original wound that does tunnel slightly. He currently denies signs of infection. 5/24; patient presents for follow-up. Earlier in the week home health called to let us know that there was yellow-green drainage in the canister of the wound VAC. He has since stopped the wound VAC and has been using Dakin's wet-to-dry dressings. 5/31; patient presents for follow-up. He tolerated the wound VAC well over the past week. He has been taking ciprofloxacin with improvement to yellow-green drainage. He also states that the small tunneled wound is not sore anymore. 6/14; patient presents for follow-up. He has been using the wound VAC without issues. We have been placing collagen under the wound VAC. He finished his course of ciprofloxacin. The previous tunneled wound has filled in. He denies signs of infection. Electronic Signature(s) Signed: 10/19/2021 4:11:59 PM By:  Kalman Shan DO Entered By: Kalman Shan on 10/19/2021 15:55:02 [name] ([id number]) -------------------------------------------------------------------------------- Physical Exam Details Patient Name: JOHNGABRIEL, VERDE. Date of Service: 10/19/2021 2:30 PM Medical Record Number: [id number] Patient Account Number: [id number] Date of Birth/Sex: 1960/08/29 (61 y.o. M) Treating RN: Levora Dredge Primary Care Provider: Derinda Late Other Clinician: Referring Provider: Derinda Late Treating Provider/Extender: Yaakov Guthrie in Treatment: 21 Constitutional . Psychiatric . Notes Left BKA with wound at the amputation site with granulation tissue and slough. No undermining noted. Overall appears well-healing. Medial to this wound there is a small open area that Has granulation tissue throughout the surface and no longer Tunnels. No signs of surrounding infection. Electronic Signature(s) Signed: 10/19/2021 4:11:59 PM By: Kalman Shan DO Entered By: Kalman Shan on 10/19/2021 15:56:04 [name] ([id number]) -------------------------------------------------------------------------------- Physician Orders Details Patient Name: [name] Date of Service: 10/19/2021 2:30 PM Medical Record Number: [id number] Patient Account Number: [id number] Date of Birth/Sex: 1961-04-12 (61 y.o. M) Treating RN: Levora Dredge Primary Care Provider: Derinda Late Other Clinician: Referring Provider: Derinda Late Treating Provider/Extender: Yaakov Guthrie in Treatment: 26 Verbal / Phone Orders: No Diagnosis Coding Follow-up Appointments o Return Appointment in 1 week. [place] only foam ok to use with wound [place] for wound care. May utilize formulary equivalent dressing for wound treatment orders unless otherwise specified. Home Health Nurse may visit PRN to address patientos wound care  needs. - CONTACT [facility] IF ANY PROBLEMS OR QUESTIONS OR CONCERNS OF USE o Scheduled days for dressing changes to be completed; exception, patient has scheduled wound care visit that day. o **Please direct any NON-WOUND related issues/requests for orders to patient's Primary Care Physician. **If current dressing causes regression in wound condition, may D/C ordered dressing product/s and apply Normal Saline Moist Dressing daily until next [place] or Other MD appointment. **Notify Wound Healing Center of regression in wound condition at [phone]. Bathing/ Shower/ Hygiene o No tub bath. Anesthetic (Use 'Patient Medications' Section for Anesthetic Order Entry) o Benzocaine applied to wound bed. Edema Control -  Lymphedema / Segmental Compressive Device / Other o DO YOUR BEST to sleep in the bed at night. DO NOT sleep in your recliner. Long hours of sitting in a recliner leads to swelling of the legs and/or potential wounds on your backside. Additional Orders / Instructions o Follow Nutritious Diet and Increase Protein Intake - monitor blood sugar to promote wound healing o Other: - Keep appointment with surgeon and PM and R after healed for prostheis as they had scheduled Negative Pressure Wound Therapy o Wound VAC settings at 111mHg continuous pressure. Use foam to wound cavity. Please order WHITE foam to fill any tunnel/s and/or undermining when necessary. Change VAC dressing 3 X WEEK. Change canister as indicated when full. - Make sure wound vac foam is in contact with the complete wound service to prevent the slough formation on edges-wet to dry dressing applied at wound appt [name] wound vRocaNurse may d/c VAC for s/s of increased infection, significant wound regression, or uncontrolled drainage. NMesaat [phone] o Number of foam/gauze pieces used in the dressing = o Place NPWT on HOLD. - as of  10/19/21 placing wound vac on hold and completing daily dressing. o Other: - Dakins wet to dry applied at wound care visit [facility] Wound Treatment Wound #1 - Amputation Site - Below Knee Wound Laterality: Left Cleanser: Dakin 16 (oz) 0.25 1 x Per Day/15 Days Discharge Instructions: Use as directed. for cleaning Cleanser: Normal Saline 1 x Per DBWG/66Days Discharge Instructions: Wash your hands with soap and water. Remove old dressing, discard into plastic bag and place into trash. Cleanse the wound with Normal Saline prior to applying a clean dressing using gauze sponges, not tissues or cotton balls. Do not scrub or use excessive force. Pat dry using gauze sponges, not tissue or cotton balls. Cleanser: Wound Cleanser 1 x Per Day/15 Days Discharge Instructions: Wash your hands with soap and water. Remove old dressing, discard into plastic bag and place into trash. Cleanse the wound with Wound Cleanser prior to applying a clean dressing using gauze sponges, not tissues or cotton balls. Do not scrub or use excessive force. Pat dry using gauze sponges, not tissue or cotton balls. Primary Dressing: Hydrofera Blue Ready Transfer Foam, 4x5 (in/in) 1 x Per Day/15 Days Discharge Instructions: Apply Hydrofera Blue Ready to wound bed as directed Secondary Dressing: (SILCONE BORDER) Zetuvit Plus SILICONE BORDER Dressing 5x5 (in/in) 1 x Per Day/15 Days BVAHE, PIENTA([id number] Discharge Instructions: Please do not put silicone bordered dressings under wraps. Use non-bordered dressing only. Wound #2 - Amputation Site - Below Knee Wound Laterality: Left, Medial Cleanser: Dakin 16 (oz) 0.25 1 x Per Day/15 Days Discharge Instructions: Use as directed. for cleaning Cleanser: Normal Saline 1 x Per DBLT/90Days Discharge Instructions: Wash your hands with soap and water. Remove old dressing, discard into plastic bag and place into trash. Cleanse the wound with Normal Saline prior to  applying a clean dressing using gauze sponges, not tissues or cotton balls. Do not scrub or use excessive force. Pat dry using gauze sponges, not tissue or cotton balls. Cleanser: Wound Cleanser 1 x Per Day/15 Days Discharge Instructions: Wash your hands with soap and water. Remove old dressing, discard into plastic bag and place into trash. Cleanse the wound with Wound Cleanser prior to applying a clean dressing using gauze sponges, not tissues or cotton balls. Do not scrub or use excessive force. Pat dry  using gauze sponges, not tissue or cotton balls. Primary Dressing: Hydrofera Blue Ready Transfer Foam, 4x5 (in/in) 1 x Per Day/15 Days Discharge Instructions: Apply Hydrofera Blue Ready to wound bed as directed Secondary Dressing: (SILCONE BORDER) Zetuvit Plus SILICONE BORDER Dressing 5x5 (in/in) 1 x Per Day/15 Days Discharge Instructions: Please do not put silicone bordered dressings under wraps. Use non-bordered dressing only. Electronic Signature(s) Signed: 10/19/2021 4:11:59 PM By: Kalman Shan DO Entered By: Kalman Shan on 10/19/2021 15:58:50 [name] ([id number]) -------------------------------------------------------------------------------- Problem List Details Patient Name: DONNIE, PANIK. Date of Service: 10/19/2021 2:30 PM Medical Record Number: [id number] Patient Account Number: [id number] Date of Birth/Sex: 04-16-1961 (61 y.o. 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 M) Treating RN: Levora Dredge Primary Care Provider: Derinda Late Other Clinician: Referring Provider: Derinda Late Treating Provider/Extender: Yaakov Guthrie in Treatment: 21 Subjective Chief Complaint Information obtained from Patient Left lower extremity amputation site wound History of Present Illness (HPI) Admission 05/25/2021 Mr. Gildardo Tickner is a 61 year old male with a past medical history of uncontrolled type 1 diabetes on insulin with last hemoglobin A1c of 8.8, peripheral arterial disease, status post left BKA that presents to the clinic for a left BKA amputation site wound. Patient developed a diabetic foot infection on 04/01/2021 and ultimately underwent a left BKA on 04/12/2021. The wound site became infected and he underwent an IandD on 05/07/2021. The wound was left open at that time. He has been using wet-to-dry dressings for the past 3 weeks. He currently denies Systemic signs of infection. 1/25; patient presents for follow-up. He has been using Dakin's wet-to-dry dressings with improvement in wound healing. He has no issues or complaints today. He denies signs of infection. 2/8; Patient presents for follow-up. He continues To use Dakin's wet-to-dry dressings. He has no issues or complaints today. He denies signs of infection. He follows up with his surgeon next  week. 2/22; patient presents for follow-up. He continues to use Dakin's wet-to-dry dressings. He denies signs of infection. He saw his vascular surgeon on 06/24/2021. They took out the staples. Nothing further to report on. 3/8; patient presents for follow-up. He has been using Dakin's wet-to-dry dressings without issues. He denies signs of infection. 3/22; patient presents for follow-up. She has been using the wound vac without any issues. She states that in the past 2 days she has had more tenderness to the wound bed. She denies signs of infection. 4/5; patient presents for follow-up. He has been using the wound VAC without issues. He has noticed improvement in wound healing. 4/19; patient presents for follow-up. He continues to use the wound VAC without issues. 5/3; patient presents for follow-up. He continues to use  the wound VAC without issues. He has no complaints today. 5/17; patient presents for follow-up. He has been using the wound VAC without issues. There is a small open wound more medial to the original wound that does tunnel slightly. He currently denies signs of infection. 5/24; patient presents for follow-up. Earlier in the week home health called to let us know that there was yellow-green drainage in the canister of the wound VAC. He has since stopped the wound VAC and has been using Dakin's wet-to-dry dressings. 5/31; patient presents for follow-up. He tolerated the wound VAC well over the past week. He has been taking ciprofloxacin with improvement to yellow-green drainage. He also states that the small tunneled wound is not sore anymore. 6/14; patient presents for follow-up. He has been using the wound VAC without issues. We have been placing collagen under the wound VAC. He finished his course of ciprofloxacin. The previous tunneled wound has filled in. He denies signs of infection. Objective Constitutional Vitals Time Taken: 2:52 PM, Height: 71 in, Weight: 250 lbs, BMI: 34.9,  Temperature: 97.9 F, Pulse: 74 bpm, Respiratory Rate: 18 breaths/min, Blood Pressure: 132/78 mmHg. General Notes: Left BKA with wound at the amputation site with granulation tissue and slough. No undermining noted. Overall appears well- healing. Medial to this wound there is a small open area that Has granulation tissue throughout the surface and no longer Tunnels. No signs of surrounding infection. IFEANYICHUKWU, WICKHAM ([id number]) Integumentary (Hair, Skin) Wound #1 status is Open. Original cause of wound was Surgical Injury. The date acquired was: 04/12/2021. The wound has been in treatment 21 weeks. The wound is located on the Left Amputation Site - Below Knee. The wound measures 0.9cm length x 2.2cm width x 0.2cm depth; 1.555cm^2 area and 0.311cm^3 volume. There is Fat Layer (Subcutaneous Tissue) exposed. There is no tunneling or undermining noted. There is a large amount of serosanguineous drainage noted. There is medium (34-66%) red, hyper - granulation within the wound bed. There is a medium (34-66%) amount of necrotic tissue within the wound bed including Adherent Slough. Wound #2 status is Open. Original cause of wound was Gradually Appeared. The date acquired was: 09/21/2021. The wound has been in treatment 3 weeks. The wound is located on the Left,Medial Amputation Site - Below Knee. The wound measures 0.1cm length x 0.2cm width x 0.1cm depth; 0.016cm^2 area and 0.002cm^3 volume. There is Fat Layer (Subcutaneous Tissue) exposed. There is no tunneling or undermining noted. There is a medium amount of serosanguineous drainage noted. There is small (1-33%) red granulation within the wound bed. There is a medium (34-66%) amount of necrotic tissue within the wound bed including Adherent Slough. 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 The medial wound that used to tunnel is flushed with granulation tissue. The more lateral wound has slough and nonviable tissue and this was debrided. At this time I think we can stop the wound VAC. We will place it on hold for right now. I recommended doing daily dressing changes with Hydrofera Blue. No signs of infection. Follow-up in 1 week. Procedures Wound #1 Pre-procedure diagnosis of Wound #1 is an Open Surgical Wound located on the Left Amputation Site - Below  Knee .Severity of Tissue Pre Debridement is: Fat layer exposed. There was a Excisional Skin/Subcutaneous Tissue Debridement with a total area of 1.98 sq cm performed by Kalman Shan, MD. With the following instrument(s): Curette to remove Viable and Non-Viable tissue/material. Material removed includes Subcutaneous Tissue and Slough and. No specimens were taken. A time out was conducted at 15:30, prior to the start of the procedure. A Moderate amount of bleeding was controlled with Pressure. The procedure was tolerated well. Post Debridement Measurements: 0.9cm length x 2.2cm width x 0.2cm depth; 0.311cm^3 volume. Character of Wound/Ulcer Post Debridement is stable. Severity of Tissue Post Debridement is: Fat layer exposed. Post procedure Diagnosis Wound #1: Same as Pre-Procedure Plan Follow-up Appointments: Return Appointment in 1 week. Home Health: [place] only foam ok to use with wound [name] for wound care. May utilize formulary equivalent dressing for wound treatment orders unless otherwise specified. Home Health Nurse may visit PRN to address patient s wound care needs. - CONTACT [facility] IF ANY PROBLEMS OR QUESTIONS OR CONCERNS OF USE Scheduled days for dressing changes to be  completed; exception, patient has scheduled wound care visit that day. **Please direct any NON-WOUND related issues/requests for orders to patient's Primary Care Physician. **If current dressing causes regression in wound condition, may D/C ordered dressing product/s and apply Normal Saline Moist Dressing daily until next [place] or Other MD appointment. **Notify W[facility]aling Center of regression in wound condition at [phone]. Bathing/ Shower/ Hygiene: No tub bath. Anesthetic (Use 'Patient Medications' Section for Anesthetic Order Entry): Benzocaine applied to wound bed. Edema Control - Lymphedema / Segmental Compressive Device / Other: DO YOUR BEST to sleep in the bed at night. DO NOT sleep in your recliner. Long hours of sitting in a recliner leads to swelling of the legs and/or potential wounds on your backside. Additional Orders / Instructions: Follow Nutritious Diet and Increase Protein Intake - monitor blood sugar to promote wound healing TASHAN, KREITZER ([id number]) Other: - Keep appointment with surgeon and PM and R after healed for prostheis as they had scheduled Negative Pressure Wound Therapy: Wound VAC settings at 158mHg continuous pressure. Use foam to wound cavity. Please order WHITE foam to fill any tunnel/s and/or undermining when necessary. Change VAC dressing 3 X WEEK. Change canister as indicated when full. - Make sure wound vac foam is in contact with the complete wound service to prevent the slough formation on edges-wet to dry dressing applied at wound appt [facility] wound v[place]Nurse may d/c VAC for s/s of increased infection, significant wound regression, or uncontrolled drainage. [place]at [phone] Number of foam/gauze pieces used in the dressing = Place NPWT on HOLD. - as of 10/19/21 placing wound vac on hold and completing daily dressing. Other: - Dakins wet to dry applied at wound care visit [facility] WOUND #1: - Amputation Site - Below Knee Wound Laterality: Left Cleanser: Dakin 16 (oz) 0.25 1 x Per Day/15 Days Discharge Instructions: Use as directed. for cleaning Cleanser: Normal Saline 1 x Per DTKP/54Days Discharge Instructions: Wash your hands with soap and water. Remove old dressing, discard into plastic bag and place into trash. Cleanse the wound with Normal Saline prior to applying a clean dressing using gauze sponges, not tissues or cotton balls. Do not scrub or use excessive force. Pat dry using gauze sponges, not tissue or cotton balls. Cleanser: Wound Cleanser 1 x  Per Day/15 Days Discharge Instructions: Wash your hands with soap and water. Remove old dressing, discard into plastic bag and place into trash. Cleanse the wound with Wound Cleanser prior to applying a clean dressing using gauze sponges, not tissues or cotton balls. Do not scrub or use excessive force. Pat dry using gauze sponges, not tissue or cotton balls. Primary Dressing: Hydrofera Blue Ready Transfer Foam, 4x5 (in/in) 1 x Per Day/15 Days Discharge Instructions: Apply Hydrofera Blue Ready to wound bed as directed Secondary Dressing: (SILCONE BORDER) Zetuvit Plus SILICONE BORDER Dressing 5x5 (in/in) 1 x Per Day/15 Days Discharge Instructions: Please do not put silicone bordered dressings under wraps. Use non-bordered dressing only. WOUND #2: - Amputation Site - Below Knee Wound Laterality: Left, Medial Cleanser: Dakin 16 (oz) 0.25 1 x Per Day/15 Days Discharge Instructions: Use as directed. for cleaning Cleanser: Normal Saline 1 x Per HEN/27 Days Discharge Instructions: Wash your hands with soap and water. Remove old dressing, discard into plastic bag and place into trash. Cleanse the wound with Normal Saline prior to applying a clean dressing using gauze sponges, not tissues or cotton balls. Do not scrub or use excessive force. Pat dry using gauze sponges, not tissue or cotton  balls. Cleanser: Wound Cleanser 1 x Per Day/15 Days Discharge Instructions: Wash your hands with soap and water. Remove old dressing, discard into plastic bag and place into trash. Cleanse the wound with Wound Cleanser prior to applying a clean dressing using gauze sponges, not tissues or cotton balls. Do not scrub or use excessive force. Pat dry using gauze sponges, not tissue or cotton balls. Primary Dressing: Hydrofera Blue Ready Transfer Foam, 4x5 (in/in) 1 x Per Day/15 Days Discharge Instructions: Apply Hydrofera Blue Ready to wound bed as directed Secondary Dressing: (SILCONE BORDER) Zetuvit Plus SILICONE BORDER Dressing 5x5 (in/in) 1 x Per Day/15 Days Discharge Instructions: Please do not put silicone bordered dressings under wraps. Use non-bordered dressing only. 1. In office sharp debridement 2. Stop wound VAC 3. Hydrofera Blue 4. Follow-up in 1 week Electronic Signature(s) Signed: 10/19/2021 4:11:59 PM By: Kalman Shan DO Entered By: Kalman Shan on 10/19/2021 15:57:51 Loughry, [name] ([id number]) -------------------------------------------------------------------------------- SuperBill Details Patient Name: [name] Date of Service: 10/19/2021 Medical Record Number: [id number] Patient Account Number: [id number] Date of Birth/Sex: [birth year] (61 y.o. 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 Facility Procedures CPT4: Description Modifier Quantity  Code [id number] 11042 - DEB SUBQ TISSUE 20 SQ CM/< 1 CPT4: ICD-10 Diagnosis Description L97.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 E10.622 Type 1 diabetes mellitus with other skin ulcer Physician Procedures CPT4: Description Modifier Quantity Code [id number] 83382 - WC PHYS LEVEL 2 - EST PT 1 CPT4: ICD-10 Diagnosis Description L97.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

## 2021-10-21 DIAGNOSIS — R21 Rash and other nonspecific skin eruption: Secondary | ICD-10-CM | POA: Diagnosis not present

## 2021-10-21 DIAGNOSIS — F330 Major depressive disorder, recurrent, mild: Secondary | ICD-10-CM | POA: Diagnosis not present

## 2021-10-21 DIAGNOSIS — Z8679 Personal history of other diseases of the circulatory system: Secondary | ICD-10-CM | POA: Diagnosis not present

## 2021-10-21 DIAGNOSIS — Z89512 Acquired absence of left leg below knee: Secondary | ICD-10-CM | POA: Diagnosis not present

## 2021-10-21 DIAGNOSIS — Z7984 Long term (current) use of oral hypoglycemic drugs: Secondary | ICD-10-CM | POA: Diagnosis not present

## 2021-10-21 DIAGNOSIS — Z8739 Personal history of other diseases of the musculoskeletal system and connective tissue: Secondary | ICD-10-CM | POA: Diagnosis not present

## 2021-10-21 DIAGNOSIS — Z930 Tracheostomy status: Secondary | ICD-10-CM | POA: Diagnosis not present

## 2021-10-21 DIAGNOSIS — E1165 Type 2 diabetes mellitus with hyperglycemia: Secondary | ICD-10-CM | POA: Diagnosis not present

## 2021-10-21 DIAGNOSIS — Z794 Long term (current) use of insulin: Secondary | ICD-10-CM | POA: Diagnosis not present

## 2021-10-21 DIAGNOSIS — Z4781 Encounter for orthopedic aftercare following surgical amputation: Secondary | ICD-10-CM | POA: Diagnosis not present

## 2021-10-21 DIAGNOSIS — I10 Essential (primary) hypertension: Secondary | ICD-10-CM | POA: Diagnosis not present

## 2021-10-26 ENCOUNTER — Encounter (HOSPITAL_BASED_OUTPATIENT_CLINIC_OR_DEPARTMENT_OTHER): Payer: HMO | Admitting: Internal Medicine

## 2021-10-26 DIAGNOSIS — Z89512 Acquired absence of left leg below knee: Secondary | ICD-10-CM

## 2021-10-26 DIAGNOSIS — L97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: Secondary | ICD-10-CM

## 2021-10-26 DIAGNOSIS — E10622 Type 1 diabetes mellitus with other skin ulcer: Secondary | ICD-10-CM

## 2021-10-26 DIAGNOSIS — T8131XA Disruption of external operation (surgical) wound, not elsewhere classified, initial encounter: Secondary | ICD-10-CM | POA: Diagnosis not present

## 2021-10-31 ENCOUNTER — Encounter: Payer: Self-pay | Admitting: Psychiatry

## 2021-10-31 ENCOUNTER — Telehealth (INDEPENDENT_AMBULATORY_CARE_PROVIDER_SITE_OTHER): Payer: HMO | Admitting: Psychiatry

## 2021-10-31 DIAGNOSIS — F5101 Primary insomnia: Secondary | ICD-10-CM | POA: Diagnosis not present

## 2021-10-31 DIAGNOSIS — T50905A Adverse effect of unspecified drugs, medicaments and biological substances, initial encounter: Secondary | ICD-10-CM

## 2021-10-31 DIAGNOSIS — Z634 Disappearance and death of family member: Secondary | ICD-10-CM

## 2021-10-31 DIAGNOSIS — F411 Generalized anxiety disorder: Secondary | ICD-10-CM

## 2021-10-31 DIAGNOSIS — F3342 Major depressive disorder, recurrent, in full remission: Secondary | ICD-10-CM

## 2021-10-31 DIAGNOSIS — R635 Abnormal weight gain: Secondary | ICD-10-CM

## 2021-10-31 MED ORDER — VENLAFAXINE HCL ER 150 MG PO CP24: 150.0000 mg | ORAL_CAPSULE | Freq: Every day | ORAL | 0 refills | Status: DC

## 2021-10-31 MED ORDER — OLANZAPINE 7.5 MG PO TABS: ORAL_TABLET | ORAL | 0 refills | Status: DC

## 2021-10-31 MED ORDER — ZOLPIDEM TARTRATE 10 MG PO TABS: 10.0000 mg | ORAL_TABLET | Freq: Every evening | ORAL | 2 refills | Status: DC | PRN

## 2021-10-31 MED ORDER — BUPROPION HCL ER (XL) 300 MG PO TB24: ORAL_TABLET | ORAL | 0 refills | Status: DC

## 2021-10-31 MED ORDER — CLONAZEPAM 1 MG PO TABS: 1.0000 mg | ORAL_TABLET | Freq: Two times a day (BID) | ORAL | 2 refills | Status: DC | PRN

## 2021-11-01 ENCOUNTER — Other Ambulatory Visit: Payer: Self-pay | Admitting: Psychiatry

## 2021-11-01 DIAGNOSIS — F5101 Primary insomnia: Secondary | ICD-10-CM

## 2021-11-02 ENCOUNTER — Encounter (HOSPITAL_BASED_OUTPATIENT_CLINIC_OR_DEPARTMENT_OTHER): Payer: HMO | Admitting: Internal Medicine

## 2021-11-02 DIAGNOSIS — E10622 Type 1 diabetes mellitus with other skin ulcer: Secondary | ICD-10-CM | POA: Diagnosis not present

## 2021-11-02 DIAGNOSIS — Z89512 Acquired absence of left leg below knee: Secondary | ICD-10-CM

## 2021-11-02 DIAGNOSIS — T8131XA Disruption of external operation (surgical) wound, not elsewhere classified, initial encounter: Secondary | ICD-10-CM | POA: Diagnosis not present

## 2021-11-02 DIAGNOSIS — L97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: Secondary | ICD-10-CM

## 2021-11-03 DIAGNOSIS — Z89512 Acquired absence of left leg below knee: Secondary | ICD-10-CM | POA: Diagnosis not present

## 2021-11-09 ENCOUNTER — Ambulatory Visit: Payer: HMO | Admitting: Internal Medicine

## 2021-11-12 ENCOUNTER — Other Ambulatory Visit: Payer: Self-pay | Admitting: Psychiatry

## 2021-11-12 DIAGNOSIS — F411 Generalized anxiety disorder: Secondary | ICD-10-CM

## 2021-11-14 DIAGNOSIS — I10 Essential (primary) hypertension: Secondary | ICD-10-CM | POA: Diagnosis not present

## 2021-11-14 DIAGNOSIS — Z89512 Acquired absence of left leg below knee: Secondary | ICD-10-CM | POA: Diagnosis not present

## 2021-11-14 DIAGNOSIS — E119 Type 2 diabetes mellitus without complications: Secondary | ICD-10-CM | POA: Diagnosis not present

## 2021-11-14 DIAGNOSIS — Z4781 Encounter for orthopedic aftercare following surgical amputation: Secondary | ICD-10-CM | POA: Diagnosis not present

## 2021-11-14 DIAGNOSIS — Z794 Long term (current) use of insulin: Secondary | ICD-10-CM | POA: Diagnosis not present

## 2021-11-14 DIAGNOSIS — I739 Peripheral vascular disease, unspecified: Secondary | ICD-10-CM | POA: Diagnosis not present

## 2021-11-14 DIAGNOSIS — F419 Anxiety disorder, unspecified: Secondary | ICD-10-CM | POA: Diagnosis not present

## 2021-11-14 DIAGNOSIS — F32A Depression, unspecified: Secondary | ICD-10-CM | POA: Diagnosis not present

## 2021-11-14 DIAGNOSIS — Z7984 Long term (current) use of oral hypoglycemic drugs: Secondary | ICD-10-CM | POA: Diagnosis not present

## 2021-11-14 DIAGNOSIS — T148XXD Other injury of unspecified body region, subsequent encounter: Secondary | ICD-10-CM | POA: Diagnosis not present

## 2021-11-16 ENCOUNTER — Encounter: Payer: HMO | Attending: Internal Medicine | Admitting: Internal Medicine

## 2021-11-16 ENCOUNTER — Telehealth: Payer: Self-pay | Admitting: Psychiatry

## 2021-11-16 DIAGNOSIS — E10622 Type 1 diabetes mellitus with other skin ulcer: Secondary | ICD-10-CM | POA: Insufficient documentation

## 2021-11-16 DIAGNOSIS — T8131XA Disruption of external operation (surgical) wound, not elsewhere classified, initial encounter: Secondary | ICD-10-CM | POA: Diagnosis not present

## 2021-11-16 DIAGNOSIS — R809 Proteinuria, unspecified: Secondary | ICD-10-CM | POA: Diagnosis not present

## 2021-11-16 DIAGNOSIS — Z89512 Acquired absence of left leg below knee: Secondary | ICD-10-CM | POA: Insufficient documentation

## 2021-11-16 DIAGNOSIS — E1051 Type 1 diabetes mellitus with diabetic peripheral angiopathy without gangrene: Secondary | ICD-10-CM | POA: Insufficient documentation

## 2021-11-16 DIAGNOSIS — Z794 Long term (current) use of insulin: Secondary | ICD-10-CM | POA: Diagnosis not present

## 2021-11-16 DIAGNOSIS — L97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: Secondary | ICD-10-CM | POA: Insufficient documentation

## 2021-11-16 DIAGNOSIS — E1029 Type 1 diabetes mellitus with other diabetic kidney complication: Secondary | ICD-10-CM | POA: Diagnosis not present

## 2021-11-16 DIAGNOSIS — F411 Generalized anxiety disorder: Secondary | ICD-10-CM

## 2021-11-16 MED ORDER — CLONAZEPAM 1 MG PO TABS: 1.0000 mg | ORAL_TABLET | Freq: Two times a day (BID) | ORAL | 2 refills | Status: DC | PRN

## 2021-11-16 NOTE — Telephone Encounter (Signed)
Patient left a voice message stating pharmacy was waiting on order for one of his medications to be refilled. He did not say which one. He can be reached at [phone]

## 2021-11-16 NOTE — Progress Notes (Signed)
[name], [name] ([id number]) Visit Report for 11/16/2021 Chief Complaint Document Details Patient Name: [name], [name]. Date of Service: 11/16/2021 2:30 PM Medical Record Number: [id number] Patient Account Number: [id number] Date of Birth/Sex: 17-Oct-1960 (61 y.o. M) Treating RN: Levora Dredge Primary Care Provider: Derinda Late Other Clinician: Referring Provider: Derinda Late Treating Provider/Extender: Yaakov Guthrie in Treatment: 25 Information Obtained from: Patient Chief Complaint Left lower extremity amputation site wound Electronic Signature(s) Signed: 11/16/2021 3:56:24 PM By: Kalman Shan DO Entered By: Kalman Shan on 11/16/2021 15:45:59 [name] ([id number]) -------------------------------------------------------------------------------- HPI Details Patient Name: [name] Date of Service: 11/16/2021 2:30 PM Medical Record Number: [id number] Patient Account Number: [id number] Date of Birth/Sex: [birth year] (61 y.o. M) Treating RN: Levora Dredge Primary Care Provider: Derinda Late Other Clinician: Referring Provider: Derinda Late Treating Provider/Extender: Yaakov Guthrie in Treatment: 25 History of Present Illness HPI Description: Admission 05/25/2021 Mr. Avari Gelles is a 61 year old male with a past medical history of uncontrolled type 1 diabetes on insulin with last hemoglobin A1c of 8.8, peripheral arterial disease, status post left BKA that presents to the clinic for a left BKA amputation site wound. Patient developed a diabetic foot infection on 04/01/2021 and ultimately underwent a left BKA on 04/12/2021. The wound site became infected and he underwent an IandD on 05/07/2021. The wound was left open at that time. He has been using wet-to-dry dressings for the past 3 weeks. He currently denies Systemic signs of infection. 1/25; patient presents for follow-up. He has been using Dakin's wet-to-dry dressings with improvement in  wound healing. He has no issues or complaints today. He denies signs of infection. 2/8; Patient presents for follow-up. He continues To use Dakin's wet-to-dry dressings. He has no issues or complaints today. He denies signs of infection. He follows up with his surgeon next week. 2/22; patient presents for follow-up. He continues to use Dakin's wet-to-dry dressings. He denies signs of infection. He saw his vascular surgeon on 06/24/2021. They took out the staples. Nothing further to report on. 3/8; patient presents for follow-up. He has been using Dakin's wet-to-dry dressings without issues. He denies signs of infection. 3/22; patient presents for follow-up. She has been using the wound vac without any issues. She states that in the past 2 days she has had more tenderness to the wound bed. She denies signs of infection. 4/5; patient presents for follow-up. He has been using the wound VAC without issues. He has noticed improvement in wound healing. 4/19; patient presents for follow-up. He continues to use the wound VAC without issues. 5/3; patient presents for follow-up. He continues to use the wound VAC without issues. He has no complaints today. 5/17; patient presents for follow-up. He has been using the wound VAC without issues. There is a small open wound more medial to the original wound that does tunnel slightly. He currently denies signs of infection. 5/24; patient presents for follow-up. Earlier in the week home health called to let us know that there was yellow-green drainage in the canister of the wound VAC. He has since stopped the wound VAC and has been using Dakin's wet-to-dry dressings. 5/31; patient presents for follow-up. He tolerated the wound VAC well over the past week. He has been taking ciprofloxacin with improvement to yellow-green drainage. He also states that the small tunneled wound is not sore anymore. 6/14; patient presents for follow-up. He has been using the wound VAC  without issues. We have been placing collagen under the wound VAC. He finished his  course of ciprofloxacin. The previous tunneled wound has filled in. He denies signs of infection. 6/21; patient presents for follow-up. He has been using Hydrofera Blue to the wound bed with benefit. He has no issues or complaints today. 6/28; patient presents for follow-up. He has been using Hydrofera Blue to the wound bed with benefit. He mentioned that the blister just distal to this has ruptured and he has been putting antibiotic ointment on it. He denies signs of infection. 7/12; patient presents for follow-up. He has been using Hydrofera Blue to the wound bed. He states that the wound is healed. He denies any drainage. Electronic Signature(s) Signed: 11/16/2021 3:56:24 PM By: Kalman Shan DO Entered By: Kalman Shan on 11/16/2021 15:46:39 [name] ([id number]) -------------------------------------------------------------------------------- Physical Exam Details Patient Name: [name], [name]. Date of Service: 11/16/2021 2:30 PM Medical Record Number: [id number] Patient Account Number: [id number] Date of Birth/Sex: Sep 18, 1960 (61 y.o. M) Treating RN: Levora Dredge Primary Care Provider: Derinda Late Other Clinician: Referring Provider: Derinda Late Treating Provider/Extender: Yaakov Guthrie in Treatment: 25 Constitutional . Psychiatric . Notes Left BKA with epithelization to the previous wound site. Electronic Signature(s) Signed: 11/16/2021 3:56:24 PM By: Kalman Shan DO Entered By: Kalman Shan on 11/16/2021 15:47:12 [name] ([id number]) -------------------------------------------------------------------------------- Physician Orders Details Patient Name: [name] Date of Service: 11/16/2021 2:30 PM Medical Record Number: [id number] Patient Account Number: [id number] Date of Birth/Sex: 08/09/1960 (61 y.o. M) Treating RN: Levora Dredge Primary  Care Provider: Derinda Late Other Clinician: Referring Provider: Derinda Late Treating Provider/Extender: Yaakov Guthrie in Treatment: 65 Verbal / Phone Orders: No Diagnosis Coding Discharge From [facility] Services o Discharge from [place] is closed, keep healed wound protected for at least the next week or two. Please do call with any issues to healed wound. If area appears dry and cracking you may apply Vaseline. You are ok to go ahead with proceeding with new prosthetic. Electronic Signature(s) Signed: 11/16/2021 3:56:24 PM By: Kalman Shan DO Previous Signature: 11/16/2021 3:48:03 PM Version By: Levora Dredge Entered By: Kalman Shan on 11/16/2021 15:48:25 [name] ([id number]) -------------------------------------------------------------------------------- Problem List Details Patient Name: ISSAI, WERLING. Date of Service: 11/16/2021 2:30 PM Medical Record Number: [id number] Patient Account Number: [id number] Date of Birth/Sex: April 23, 1961 (61 y.o. M) Treating RN: Levora Dredge Primary Care Provider: Derinda Late Other Clinician: Referring Provider: Derinda Late Treating Provider/Extender: Yaakov Guthrie in Treatment: 25 Active Problems ICD-10 Encounter Code Description Active Date MDM Diagnosis [id number] Acquired absence of left leg below knee 05/25/2021 No Yes T81.31XA Disruption of external operation (surgical) wound, not elsewhere 05/25/2021 No Yes classified, initial encounter E10.622 Type 1 diabetes mellitus with other skin ulcer 05/25/2021 No Yes L97.826 Non-pressure chronic ulcer of other part of left lower leg with bone 05/25/2021 No Yes involvement without evidence of necrosis Inactive Problems Resolved Problems Electronic Signature(s) Signed: 11/16/2021 3:56:24 PM By: Kalman Shan DO Entered By: Kalman Shan on 11/16/2021 15:45:55 [name]  ([id number]) -------------------------------------------------------------------------------- Progress Note Details Patient Name: [name] Date of Service: 11/16/2021 2:30 PM Medical Record Number: [id number] Patient Account Number: [id number] Date of Birth/Sex: [birth year] (61 y.o. M) Treating RN: Levora Dredge Primary Care Provider: Derinda Late Other Clinician: Referring Provider: Derinda Late Treating Provider/Extender: Yaakov Guthrie in Treatment: 25 Subjective Chief Complaint Information obtained from Patient Left lower extremity amputation site wound History of Present Illness (HPI) Admission 05/25/2021 Mr. Dejion Grillo is a 61 year old male with a past medical history of uncontrolled  type 1 diabetes on insulin with last hemoglobin A1c of 8.8, peripheral arterial disease, status post left BKA that presents to the clinic for a left BKA amputation site wound. Patient developed a diabetic foot infection on 04/01/2021 and ultimately underwent a left BKA on 04/12/2021. The wound site became infected and he underwent an IandD on 05/07/2021. The wound was left open at that time. He has been using wet-to-dry dressings for the past 3 weeks. He currently denies Systemic signs of infection. 1/25; patient presents for follow-up. He has been using Dakin's wet-to-dry dressings with improvement in wound healing. He has no issues or complaints today. He denies signs of infection. 2/8; Patient presents for follow-up. He continues To use Dakin's wet-to-dry dressings. He has no issues or complaints today. He denies signs of infection. He follows up with his surgeon next week. 2/22; patient presents for follow-up. He continues to use Dakin's wet-to-dry dressings. He denies signs of infection. He saw his vascular surgeon on 06/24/2021. They took out the staples. Nothing further to report on. 3/8; patient presents for follow-up. He has been using Dakin's wet-to-dry dressings without  issues. He denies signs of infection. 3/22; patient presents for follow-up. She has been using the wound vac without any issues. She states that in the past 2 days she has had more tenderness to the wound bed. She denies signs of infection. 4/5; patient presents for follow-up. He has been using the wound VAC without issues. He has noticed improvement in wound healing. 4/19; patient presents for follow-up. He continues to use the wound VAC without issues. 5/3; patient presents for follow-up. He continues to use the wound VAC without issues. He has no complaints today. 5/17; patient presents for follow-up. He has been using the wound VAC without issues. There is a small open wound more medial to the original wound that does tunnel slightly. He currently denies signs of infection. 5/24; patient presents for follow-up. Earlier in the week home health called to let us know that there was yellow-green drainage in the canister of the wound VAC. He has since stopped the wound VAC and has been using Dakin's wet-to-dry dressings. 5/31; patient presents for follow-up. He tolerated the wound VAC well over the past week. He has been taking ciprofloxacin with improvement to yellow-green drainage. He also states that the small tunneled wound is not sore anymore. 6/14; patient presents for follow-up. He has been using the wound VAC without issues. We have been placing collagen under the wound VAC. He finished his course of ciprofloxacin. The previous tunneled wound has filled in. He denies signs of infection. 6/21; patient presents for follow-up. He has been using Hydrofera Blue to the wound bed with benefit. He has no issues or complaints today. 6/28; patient presents for follow-up. He has been using Hydrofera Blue to the wound bed with benefit. He mentioned that the blister just distal to this has ruptured and he has been putting antibiotic ointment on it. He denies signs of infection. 7/12; patient presents for  follow-up. He has been using Hydrofera Blue to the wound bed. He states that the wound is healed. He denies any drainage. Objective Constitutional [name], [name] ([id number]) Vitals Time Taken: 2:26 PM, Holder: 71 in, Weight: 250 lbs, BMI: 34.9, Temperature: 97.6 F, Pulse: 70 bpm, Respiratory Rate: 20 breaths/min, Blood Pressure: 149/82 mmHg. General Notes: Left BKA with epithelization to the previous wound site. Integumentary (Hair, Skin) Wound #1 status is Healed - Epithelialized. Original cause of wound was Surgical Injury.  The date acquired was: 04/12/2021. The wound has been in treatment 25 weeks. The wound is located on the Left Amputation Site - Below Knee. The wound measures 0cm length x 0cm width x 0cm depth; 0.008cm^2 area and 0.001cm^3 volume. There is no tunneling or undermining noted. There is a none present amount of drainage noted. There is no granulation within the wound bed. There is no necrotic tissue within the wound bed. 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 The wound has healed. I recommended he protect this area for the next 1 to 2 weeks. Follow-up as needed. Plan Discharge From [facility] Services: Discharge from [place] Treatment Complete - Wound is closed, keep healed wound protected for at least the next week or two. Please do call with any issues to healed wound. If area appears dry and cracking you may apply Vaseline. You are ok to go ahead with proceeding with new prosthetic. 1. Discharge from clinic due to closed wound 2. Follow-up as needed Electronic Signature(s) Signed: 11/16/2021 3:56:24 PM By: Kalman Shan DO Entered  By: Kalman Shan on 11/16/2021 15:47:47 [name], [name] ([id number]) -------------------------------------------------------------------------------- SuperBill Details Patient Name: [name] Date of Service: 11/16/2021 Medical Record Number: [id number] Patient Account Number: [id number] Date of Birth/Sex: 1960-12-08 (61 y.o. 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 Facility Procedures CPT4 Code: [id number] Description: [phone] - WOUND CARE VISIT-LEV 2 EST PT Modifier: Quantity: 1 Physician Procedures CPT4: Description Modifier Quantity Code [id number] 99213 - WC PHYS LEVEL 3 - EST PT 1 CPT4: ICD-10 Diagnosis Description L97.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

## 2021-11-16 NOTE — Telephone Encounter (Signed)
pt left a message that the pharmacy does not have a rx for the klonopin.

## 2021-11-16 NOTE — Telephone Encounter (Signed)
Done

## 2021-11-16 NOTE — Progress Notes (Signed)
[name], [name] ([id number]) Visit Report for 11/16/2021 Arrival Information Details Patient Name: [name], [name]. Date of Service: 11/16/2021 2:30 PM Medical Record Number: [id number] Patient Account Number: [id number] Date of Birth/Sex: 1960/08/02 (61 y.o. M) Treating RN: Levora Dredge Primary Care Cortez Flippen: Derinda Late Other Clinician: Referring Gerson Fauth: Derinda Late Treating Keeshawn Fakhouri/Extender: Yaakov Guthrie in Treatment: 25 Visit Information History Since Last Visit Added or deleted any medications: No Patient Arrived: Wheel Chair Any new allergies or adverse reactions: No Arrival Time: 14:25 Had a fall or experienced change in No Accompanied By: self mother activities of daily living that may affect Transfer Assistance: None risk of falls: Patient Identification Verified: Yes Hospitalized since last visit: No Secondary Verification Process Completed: Yes Has Dressing in Place as Prescribed: Yes Patient Requires Transmission-Based No Pain Present Now: No Precautions: Patient Has Alerts: Yes Patient Alerts: Patient on Blood Thinner Eliquis Diabetic Electronic Signature(s) Signed: 11/16/2021 3:48:03 PM By: Levora Dredge Entered By: Levora Dredge on 11/16/2021 14:26:40 [name] ([id number]) -------------------------------------------------------------------------------- Clinic Level of Care Assessment Details Patient Name: [name] Date of Service: 11/16/2021 2:30 PM Medical Record Number: [id number] Patient Account Number: [id number] Date of Birth/Sex: 1960/12/23 (61 y.o. M) Treating RN: Levora Dredge Primary Care Isatou Agredano: Derinda Late Other Clinician: Referring Kary Colaizzi: Derinda Late Treating Evely Gainey/Extender: Yaakov Guthrie in Treatment: 25 Clinic Level of Care Assessment Items TOOL 4 Quantity Score '[]'$  - Use when only an EandM is performed on FOLLOW-UP visit 0 ASSESSMENTS - Nursing Assessment / Reassessment X -  Reassessment of Co-morbidities (includes updates in patient status) 1 10 X- 1 5 Reassessment of Adherence to Treatment Plan ASSESSMENTS - Wound and Skin Assessment / Reassessment X - Simple Wound Assessment / Reassessment - one wound 1 5 '[]'$  - 0 Complex Wound Assessment / Reassessment - multiple wounds '[]'$  - 0 Dermatologic / Skin Assessment (not related to wound area) ASSESSMENTS - Focused Assessment '[]'$  - Circumferential Edema Measurements - multi extremities 0 '[]'$  - 0 Nutritional Assessment / Counseling / Intervention '[]'$  - 0 Lower Extremity Assessment (monofilament, tuning fork, pulses) '[]'$  - 0 Peripheral Arterial Disease Assessment (using hand held doppler) ASSESSMENTS - Ostomy and/or Continence Assessment and Care '[]'$  - Incontinence Assessment and Management 0 '[]'$  - 0 Ostomy Care Assessment and Management (repouching, etc.) PROCESS - Coordination of Care X - Simple Patient / Family Education for ongoing care 1 15 '[]'$  - 0 Complex (extensive) Patient / Family Education for ongoing care '[]'$  - 0 Staff obtains Programmer, systems, Records, Test Results / Process Orders '[]'$  - 0 Staff telephones HHA, Nursing Homes / Clarify orders / etc '[]'$  - 0 Routine Transfer to another Facility (non-emergent condition) '[]'$  - 0 Routine Hospital Admission (non-emergent condition) '[]'$  - 0 New Admissions / Biomedical engineer / Ordering NPWT, Apligraf, etc. '[]'$  - 0 Emergency Hospital Admission (emergent condition) X- 1 10 Simple Discharge Coordination '[]'$  - 0 Complex (extensive) Discharge Coordination PROCESS - Special Needs '[]'$  - Pediatric / Minor Patient Management 0 '[]'$  - 0 Isolation Patient Management '[]'$  - 0 Hearing / Language / Visual special needs '[]'$  - 0 Assessment of Community assistance (transportation, D/C planning, etc.) '[]'$  - 0 Additional assistance / Altered mentation '[]'$  - 0 Support Surface(s) Assessment (bed, cushion, seat, etc.) INTERVENTIONS - Wound Cleansing / Measurement [name], [name].  ([id number]) X- 1 5 Simple Wound Cleansing - one wound '[]'$  - 0 Complex Wound Cleansing - multiple wounds X- 1 5 Wound Imaging (photographs - any number of wounds) '[]'$  - 0 Wound Tracing (instead  of photographs) X- 1 5 Simple Wound Measurement - one wound '[]'$  - 0 Complex Wound Measurement - multiple wounds INTERVENTIONS - Wound Dressings '[]'$  - Small Wound Dressing one or multiple wounds 0 '[]'$  - 0 Medium Wound Dressing one or multiple wounds '[]'$  - 0 Large Wound Dressing one or multiple wounds '[]'$  - 0 Application of Medications - topical '[]'$  - 0 Application of Medications - injection INTERVENTIONS - Miscellaneous '[]'$  - External ear exam 0 '[]'$  - 0 Specimen Collection (cultures, biopsies, blood, body fluids, etc.) '[]'$  - 0 Specimen(s) / Culture(s) sent or taken to Lab for analysis '[]'$  - 0 Patient Transfer (multiple staff / Harrel Lemon Lift / Similar devices) '[]'$  - 0 Simple Staple / Suture removal (25 or less) '[]'$  - 0 Complex Staple / Suture removal (26 or more) '[]'$  - 0 Hypo / Hyperglycemic Management (close monitor of Blood Glucose) '[]'$  - 0 Ankle / Brachial Index (ABI) - do not check if billed separately X- 1 5 Vital Signs Has the patient been seen at the hospital within the last three years: Yes Total Score: 65 Level Of Care: New/Established - Level 2 Electronic Signature(s) Signed: 11/16/2021 3:48:03 PM By: Levora Dredge Entered By: Levora Dredge on 11/16/2021 15:28:25 [name] ([id number]) -------------------------------------------------------------------------------- Encounter Discharge Information Details Patient Name: [name] Date of Service: 11/16/2021 2:30 PM Medical Record Number: [id number] Patient Account Number: [id number] Date of Birth/Sex: July 16, 1960 (61 y.o. M) Treating RN: Levora Dredge Primary Care Janesa Dockery: Derinda Late Other Clinician: Referring Jazmina Muhlenkamp: Derinda Late Treating Reizel Calzada/Extender: Yaakov Guthrie in Treatment: 25 Encounter  Discharge Information Items Discharge Condition: Stable Ambulatory Status: Wheelchair Discharge Destination: Home Transportation: Private Auto Accompanied By: mother Schedule Follow-up Appointment: No Clinical Summary of Care: Electronic Signature(s) Signed: 11/16/2021 3:29:28 PM By: Levora Dredge Entered By: Levora Dredge on 11/16/2021 15:29:28 [name] ([id number]) -------------------------------------------------------------------------------- Lower Extremity Assessment Details Patient Name: [name], [name]. Date of Service: 11/16/2021 2:30 PM Medical Record Number: [id number] Patient Account Number: [id number] Date of Birth/Sex: [birth year] (61 y.o. M) Treating RN: Levora Dredge Primary Care Shadonna Benedick: Derinda Late Other Clinician: Referring Cardarius Senat: Derinda Late Treating Osmin Welz/Extender: Yaakov Guthrie in Treatment: 25 Electronic Signature(s) Signed: 11/16/2021 3:48:03 PM By: Levora Dredge Entered By: Levora Dredge on 11/16/2021 14:32:56 [name] ([id number]) -------------------------------------------------------------------------------- Multi Wound Chart Details Patient Name: [name], [name]. Date of Service: 11/16/2021 2:30 PM Medical Record Number: [id number] Patient Account Number: [id number] Date of Birth/Sex: [birth year] (61 y.o. 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 Comorbid  History: Coronary Artery Disease, N/A N/A Hypertension, Type I Diabetes Date Acquired: 04/12/2021 N/A N/A Weeks of Treatment: 25 N/A N/A Wound Status: Open N/A N/A Wound Recurrence: No N/A N/A Pending Amputation on Yes N/A N/A Presentation: Measurements L x W x D (cm) 0.1x0.1x0.1 N/A N/A Area (cm) : 0.008 N/A N/A Volume (cm) : 0.001 N/A N/A % Reduction in Area: 100.00% N/A N/A % Reduction in Volume: 100.00% N/A N/A Classification: Full Thickness With Exposed N/A N/A Support Structures Exudate Amount: None Present N/A N/A Granulation Amount: None Present (0%) N/A N/A Necrotic Amount: None  Present (0%) N/A N/A Exposed Structures: Fascia: No N/A N/A Fat Layer (Subcutaneous Tissue): No Tendon: No Muscle: No Joint: No Bone: No Epithelialization: Large (67-100%) N/A N/A Treatment Notes Electronic Signature(s) Signed: 11/16/2021 3:48:03 PM By: Levora Dredge Entered By: Levora Dredge on 11/16/2021 14:59:08 [name] ([id number]) -------------------------------------------------------------------------------- [place] Details Patient Name: [name] Date of Service: 11/16/2021 2:30 PM Medical Record Number: [id number] Patient Account Number: [id number] Date of Birth/Sex: [birth year] (61 y.o. M) Treating RN: Levora Dredge Primary Care Wali Reinheimer: Derinda Late Other Clinician: Referring Tavoris Brisk: Derinda Late Treating Madora Barletta/Extender: Yaakov Guthrie in Treatment: 25 Active Inactive Electronic Signature(s) Signed: 11/16/2021 3:27:53 PM By: Levora Dredge Entered By: Levora Dredge on 11/16/2021 15:27:53 [name] ([id number]) -------------------------------------------------------------------------------- Pain Assessment Details Patient Name: [name], [name] Date of Service: 11/16/2021 2:30 PM Medical Record Number: [id number] Patient Account Number: [id number] Date of Birth/Sex: 06-03-1960 (61 y.o. M) Treating RN: Levora Dredge Primary Care Javohn Basey: Derinda Late Other Clinician: Referring Pamlea Finder: Derinda Late Treating Jasai Sorg/Extender: Yaakov Guthrie in Treatment: 25 Active Problems Location of Pain Severity and Description of Pain Patient Has Paino No Site Locations Rate the pain. Current Pain Level: 0 Pain Management and Medication Current Pain Management: Electronic Signature(s) Signed: 11/16/2021 3:48:03 PM By: Levora Dredge Entered By: Levora Dredge on 11/16/2021 14:28:27 [name] ([id number]) -------------------------------------------------------------------------------- Patient/Caregiver Education Details Patient Name: [name] Date of Service: 11/16/2021 2:30 PM Medical Record Number: [id number] Patient Account Number: [id number] Date of Birth/Gender: 14-Jul-1960 (61 y.o. M) Treating RN: Levora Dredge Primary Care Physician: Derinda Late Other Clinician: Referring Physician: Derinda Late Treating Physician/Extender: Yaakov Guthrie in Treatment: 25 Education Assessment Education Provided To: Patient Education Topics Provided Wound/Skin Impairment: Handouts: Other: care of healed wound Methods: Explain/Verbal Responses: State content correctly Electronic Signature(s) Signed: 11/16/2021 3:48:03 PM By: Levora Dredge Entered By: Levora Dredge on 11/16/2021 15:28:44 [name] ([id number]) -------------------------------------------------------------------------------- Wound Assessment Details Patient Name: [name] Date of Service: 11/16/2021 2:30 PM Medical Record Number: [id number] Patient Account Number: [id number] Date of Birth/Sex: 02/03/1961 (61 y.o. 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 Etiology: Wounding Event: Surgical Injury Wound Status: Healed - Epithelialized Date Acquired: 04/12/2021 Comorbid History: Coronary Artery Disease, Hypertension, Type I Weeks Of Treatment: 25 Diabetes Clustered Wound: No Pending Amputation On Presentation Photos Wound Measurements Length: (cm) 0 Width: (cm) 0 Depth: (cm) 0 Area: (cm) 0.008 Volume: (cm) 0.001 % Reduction in Area: 100% % Reduction in Volume: 100% Epithelialization: Large (67-100%) Tunneling: No Undermining: No Wound Description Classification: Full Thickness With Exposed Support Structures Exudate Amount: None Present Foul Odor After Cleansing: No Slough/Fibrino No Wound Bed Granulation Amount: None Present (0%) Exposed Structure Necrotic Amount: None Present (0%) Fascia Exposed: No Fat Layer (Subcutaneous Tissue) Exposed: No Tendon Exposed: No Muscle Exposed: No Joint Exposed: No Bone Exposed: No Treatment Notes Wound #1 (Amputation Site - Below Knee) Wound Laterality: Left Cleanser Peri-Wound Care Topical Primary Dressing [name], [name] ([id number]) Secondary Dressing Secured With Compression Wrap Compression Stockings Add-Ons Electronic Signature(s) Signed: 11/16/2021 3:48:03 PM By: Levora Dredge Entered By: Levora Dredge on 11/16/2021 15:30:40 [name] ([id number]) -------------------------------------------------------------------------------- Vitals Details Patient Name: [name] Date of Service: 11/16/2021 2:30 PM Medical Record Number: [id number] Patient Account Number:  [id number] Date of Birth/Sex: [birth year] (61 y.o. M) Treating RN: Levora Dredge Primary Care Netasha Wehrli: Derinda Late Other Clinician: Referring Arriana Lohmann: Derinda Late Treating Tamber Burtch/Extender: Yaakov Guthrie in Treatment: 25 Vital Signs Time Taken: 14:26 Temperature (F): 97.6 Holder  (in): 71 Pulse (bpm): 70 Weight (lbs): 250 Respiratory Rate (breaths/min): 20 Body Mass Index (BMI): 34.9 Blood Pressure (mmHg): 149/82 Reference Range: 80 - 120 mg / dl Electronic Signature(s) Signed: 11/16/2021 3:48:03 PM By: Levora Dredge Entered By: Levora Dredge on 11/16/2021 14:28:01

## 2021-11-16 NOTE — Telephone Encounter (Signed)
I have sent another prescription for clonazepam 1 mg twice a day. Also spoke to a pharmacist-gave a verbal order.  Contacted patient back and discussed with him to contact his pharmacy.

## 2021-11-16 NOTE — Telephone Encounter (Signed)
Called the pharmacy and she states that they did not receive the rx for the klonopin  clonazePAM (KLONOPIN) 1 MG tablet Medication Date: 10/31/2021 Department: [facility] Ordering/Authorizing: Ursula Alert, MD   Order Providers  Prescribing Provider Encounter Provider  Ursula Alert, MD Ursula Alert, MD   Outpatient Medication Detail   Disp Refills Start End   clonazePAM (KLONOPIN) 1 MG tablet 60 tablet 2 10/31/2021    Sig - Route: Take 1 tablet (1 mg total) by mouth 2 (two) times daily as needed for anxiety. - Oral   Sent to pharmacy as: clonazePAM (KLONOPIN) 1 MG tablet   E-Prescribing Status: Receipt confirmed by pharmacy (10/31/2021  3:47 PM EDT)    Associated Diagnoses  GAD (generalized anxiety disorder)      Pharmacy  [id number] - [address], [place]

## 2021-11-17 DIAGNOSIS — E1029 Type 1 diabetes mellitus with other diabetic kidney complication: Secondary | ICD-10-CM | POA: Diagnosis not present

## 2021-11-17 DIAGNOSIS — E1069 Type 1 diabetes mellitus with other specified complication: Secondary | ICD-10-CM | POA: Diagnosis not present

## 2021-11-17 DIAGNOSIS — R809 Proteinuria, unspecified: Secondary | ICD-10-CM | POA: Diagnosis not present

## 2021-11-17 DIAGNOSIS — I152 Hypertension secondary to endocrine disorders: Secondary | ICD-10-CM | POA: Diagnosis not present

## 2021-11-17 DIAGNOSIS — E785 Hyperlipidemia, unspecified: Secondary | ICD-10-CM | POA: Diagnosis not present

## 2021-11-17 DIAGNOSIS — E039 Hypothyroidism, unspecified: Secondary | ICD-10-CM | POA: Diagnosis not present

## 2021-11-18 DIAGNOSIS — Z430 Encounter for attention to tracheostomy: Secondary | ICD-10-CM | POA: Diagnosis not present

## 2021-11-21 DIAGNOSIS — E119 Type 2 diabetes mellitus without complications: Secondary | ICD-10-CM | POA: Diagnosis not present

## 2021-11-21 DIAGNOSIS — I739 Peripheral vascular disease, unspecified: Secondary | ICD-10-CM | POA: Diagnosis not present

## 2021-11-21 DIAGNOSIS — Z7984 Long term (current) use of oral hypoglycemic drugs: Secondary | ICD-10-CM | POA: Diagnosis not present

## 2021-11-21 DIAGNOSIS — E1029 Type 1 diabetes mellitus with other diabetic kidney complication: Secondary | ICD-10-CM | POA: Diagnosis not present

## 2021-11-21 DIAGNOSIS — Z4781 Encounter for orthopedic aftercare following surgical amputation: Secondary | ICD-10-CM | POA: Diagnosis not present

## 2021-11-21 DIAGNOSIS — Z794 Long term (current) use of insulin: Secondary | ICD-10-CM | POA: Diagnosis not present

## 2021-11-21 DIAGNOSIS — T148XXD Other injury of unspecified body region, subsequent encounter: Secondary | ICD-10-CM | POA: Diagnosis not present

## 2021-11-21 DIAGNOSIS — I10 Essential (primary) hypertension: Secondary | ICD-10-CM | POA: Diagnosis not present

## 2021-11-21 DIAGNOSIS — R809 Proteinuria, unspecified: Secondary | ICD-10-CM | POA: Diagnosis not present

## 2021-11-23 DIAGNOSIS — I739 Peripheral vascular disease, unspecified: Secondary | ICD-10-CM | POA: Diagnosis not present

## 2021-11-23 DIAGNOSIS — Z794 Long term (current) use of insulin: Secondary | ICD-10-CM | POA: Diagnosis not present

## 2021-11-23 DIAGNOSIS — Z4781 Encounter for orthopedic aftercare following surgical amputation: Secondary | ICD-10-CM | POA: Diagnosis not present

## 2021-11-23 DIAGNOSIS — I10 Essential (primary) hypertension: Secondary | ICD-10-CM | POA: Diagnosis not present

## 2021-11-23 DIAGNOSIS — Z7984 Long term (current) use of oral hypoglycemic drugs: Secondary | ICD-10-CM | POA: Diagnosis not present

## 2021-11-23 DIAGNOSIS — T148XXD Other injury of unspecified body region, subsequent encounter: Secondary | ICD-10-CM | POA: Diagnosis not present

## 2021-11-23 DIAGNOSIS — E119 Type 2 diabetes mellitus without complications: Secondary | ICD-10-CM | POA: Diagnosis not present

## 2021-11-28 ENCOUNTER — Other Ambulatory Visit: Payer: Self-pay | Admitting: Psychiatry

## 2021-11-28 DIAGNOSIS — Z634 Disappearance and death of family member: Secondary | ICD-10-CM

## 2021-12-03 DIAGNOSIS — Z89512 Acquired absence of left leg below knee: Secondary | ICD-10-CM | POA: Diagnosis not present

## 2021-12-06 DIAGNOSIS — Z6835 Body mass index (BMI) 35.0-35.9, adult: Secondary | ICD-10-CM | POA: Diagnosis not present

## 2021-12-06 DIAGNOSIS — Z89512 Acquired absence of left leg below knee: Secondary | ICD-10-CM | POA: Diagnosis not present

## 2021-12-07 ENCOUNTER — Other Ambulatory Visit: Payer: Self-pay | Admitting: Psychiatry

## 2021-12-07 DIAGNOSIS — I739 Peripheral vascular disease, unspecified: Secondary | ICD-10-CM | POA: Diagnosis not present

## 2021-12-07 DIAGNOSIS — Z634 Disappearance and death of family member: Secondary | ICD-10-CM

## 2021-12-07 DIAGNOSIS — Z4781 Encounter for orthopedic aftercare following surgical amputation: Secondary | ICD-10-CM | POA: Diagnosis not present

## 2021-12-07 DIAGNOSIS — F32A Depression, unspecified: Secondary | ICD-10-CM | POA: Diagnosis not present

## 2021-12-07 DIAGNOSIS — I10 Essential (primary) hypertension: Secondary | ICD-10-CM | POA: Diagnosis not present

## 2021-12-07 DIAGNOSIS — Z7984 Long term (current) use of oral hypoglycemic drugs: Secondary | ICD-10-CM | POA: Diagnosis not present

## 2021-12-07 DIAGNOSIS — F419 Anxiety disorder, unspecified: Secondary | ICD-10-CM | POA: Diagnosis not present

## 2021-12-07 DIAGNOSIS — T148XXD Other injury of unspecified body region, subsequent encounter: Secondary | ICD-10-CM | POA: Diagnosis not present

## 2021-12-07 DIAGNOSIS — Z89512 Acquired absence of left leg below knee: Secondary | ICD-10-CM | POA: Diagnosis not present

## 2021-12-07 DIAGNOSIS — E119 Type 2 diabetes mellitus without complications: Secondary | ICD-10-CM | POA: Diagnosis not present

## 2021-12-07 DIAGNOSIS — Z794 Long term (current) use of insulin: Secondary | ICD-10-CM | POA: Diagnosis not present

## 2021-12-13 DIAGNOSIS — E119 Type 2 diabetes mellitus without complications: Secondary | ICD-10-CM | POA: Diagnosis not present

## 2021-12-13 DIAGNOSIS — Z7984 Long term (current) use of oral hypoglycemic drugs: Secondary | ICD-10-CM | POA: Diagnosis not present

## 2021-12-13 DIAGNOSIS — Z993 Dependence on wheelchair: Secondary | ICD-10-CM | POA: Diagnosis not present

## 2021-12-13 DIAGNOSIS — I10 Essential (primary) hypertension: Secondary | ICD-10-CM | POA: Diagnosis not present

## 2021-12-13 DIAGNOSIS — Z8679 Personal history of other diseases of the circulatory system: Secondary | ICD-10-CM | POA: Diagnosis not present

## 2021-12-13 DIAGNOSIS — Z794 Long term (current) use of insulin: Secondary | ICD-10-CM | POA: Diagnosis not present

## 2021-12-13 DIAGNOSIS — Z930 Tracheostomy status: Secondary | ICD-10-CM | POA: Diagnosis not present

## 2021-12-13 DIAGNOSIS — Z89512 Acquired absence of left leg below knee: Secondary | ICD-10-CM | POA: Diagnosis not present

## 2021-12-13 DIAGNOSIS — F330 Major depressive disorder, recurrent, mild: Secondary | ICD-10-CM | POA: Diagnosis not present

## 2021-12-13 DIAGNOSIS — Z6835 Body mass index (BMI) 35.0-35.9, adult: Secondary | ICD-10-CM | POA: Diagnosis not present

## 2021-12-19 DIAGNOSIS — Z430 Encounter for attention to tracheostomy: Secondary | ICD-10-CM | POA: Diagnosis not present

## 2021-12-20 DIAGNOSIS — R809 Proteinuria, unspecified: Secondary | ICD-10-CM | POA: Diagnosis not present

## 2021-12-20 DIAGNOSIS — E1029 Type 1 diabetes mellitus with other diabetic kidney complication: Secondary | ICD-10-CM | POA: Diagnosis not present

## 2021-12-26 ENCOUNTER — Ambulatory Visit (INDEPENDENT_AMBULATORY_CARE_PROVIDER_SITE_OTHER): Payer: HMO | Admitting: Dermatology

## 2021-12-26 DIAGNOSIS — L309 Dermatitis, unspecified: Secondary | ICD-10-CM | POA: Diagnosis not present

## 2021-12-26 DIAGNOSIS — B356 Tinea cruris: Secondary | ICD-10-CM

## 2021-12-26 MED ORDER — MOMETASONE FUROATE 0.1 % EX CREA: TOPICAL_CREAM | CUTANEOUS | 1 refills | Status: AC

## 2021-12-26 MED ORDER — KETOCONAZOLE 2 % EX CREA: TOPICAL_CREAM | CUTANEOUS | 3 refills | Status: DC

## 2021-12-26 NOTE — Progress Notes (Signed)
   New Patient Visit  Subjective  [name] is a 61 y.o. male who presents for the following: New Patient (Initial Visit).  Patient presents for rash that initially started on the inner thighs and groin 6 months ago, which comes and goes. Rash was itchy. He tried treatment of TMC, Nystatin, Bactroban mixed, as well as Cephalexin x 5 days. Some improvement with TMC 0.1% Cream only. Patient is not currently using any treatments for rash. Rash started to improve about a week ago and states that rash in the groin is now clear. He does have a few itchy bumps on his forearms, chest, and right ear.   Referral from Dr Baldemar Lenis, [facility].   The following portions of the chart were reviewed this encounter and updated as appropriate:       Review of Systems:  No other skin or systemic complaints except as noted in HPI or Assessment and Plan.  Objective  Well appearing patient in no apparent distress; mood and affect are within normal limits.  A focused examination was performed including face, arms. Relevant physical exam findings are noted in the Assessment and Plan.  arms, inner thighs Small pink papules of the left extensor forearm; pink papules on the left clavicle; pink scaly patch of the right preauricular. Itchy per pt  groin Clear today.    Assessment & Plan  Dermatitis arms, inner thighs  Chronic and persistent condition with duration or expected duration over one year. Condition is symptomatic / bothersome to patient. Improving but not to goal.  Start mometasone cream Apply to rash on arms, chest, ear BID until improved dsp 45g 0Rf.  Recommend mild soap and moisturizing cream 1-2 times daily.  Gentle skin care handout provided.  Avoid Axe soap, and use Dove soap instead  Recommend OTC Gold Bond Rapid Relief Anti-Itch cream (pramoxine + menthol), CeraVe Anti-itch cream or lotion (pramoxine), Sarna lotion (Original- menthol + camphor or Sensitive- pramoxine) or  Eucerin 12 hour Itch Relief lotion (menthol) up to 3 times per day to areas on body that are itchy.   mometasone (ELOCON) 0.1 % cream - arms, inner thighs Apply to rash on arms and chest twice daily until improved.  Tinea cruris groin  Chronic condition with duration or expected duration over one year. Currently improved.  Start ketoconazole 2% cream Apply to groin area and inner thighs QHS dsp 60g 3Rf.  Recommend Zeasorb AF Powder Apply to groin area QAM. Avoid using at the same time as ketoconazole.  If flares, patient will contact office for appointment.  Do not recommend TMC cream to groin area due to risk of skin thinning  ketoconazole (NIZORAL) 2 % cream - groin Apply to groin and inner thighs once a day for rash.   Return if symptoms worsen or fail to improve.  IJamesetta Orleans, CMA, am acting as scribe for Brendolyn Patty, MD .  Documentation: I have reviewed the above documentation for accuracy and completeness, and I agree with the above.  Brendolyn Patty MD

## 2021-12-26 NOTE — Patient Instructions (Addendum)
Start Zeasorb AF Powder (OTC) Apply to groin area every morning. Start Ketoconazole 2% Cream Apply to groin and inner thighs every night.  Start Mometasone Cream Apply to rash on arms, chest, right ear twice a day until rash improved. May use in groin for short periods of time for severe flares.  Topical steroids (such as triamcinolone, fluocinolone, fluocinonide, mometasone, clobetasol, halobetasol, betamethasone, hydrocortisone) can cause thinning and lightening of the skin if they are used for too long in the same area. Your physician has selected the right strength medicine for your problem and area affected on the body. Please use your medication only as directed by your physician to prevent side effects.   Recommend mild soap and moisturizing cream 1-2 times daily.  Gentle skin care handout provided.   Recommend OTC Gold Bond Rapid Relief Anti-Itch cream (pramoxine + menthol), CeraVe Anti-itch cream or lotion (pramoxine), Sarna lotion (Original- menthol + camphor or Sensitive- pramoxine) or Eucerin 12 hour Itch Relief lotion (menthol) up to 3 times per day to areas on body that are itchy.   Due to recent changes in healthcare laws, you may see results of your pathology and/or laboratory studies on MyChart before the doctors have had a chance to review them. We understand that in some cases there may be results that are confusing or concerning to you. Please understand that not all results are received at the same time and often the doctors may need to interpret multiple results in order to provide you with the best plan of care or course of treatment. Therefore, we ask that you please give [place] 2 business days to thoroughly review all your results before contacting the office for clarification. Should we see a critical lab result, you will be contacted sooner.   If You Need Anything After Your Visit  If you have any questions or concerns for your doctor, please call our main line at [phone]  and press option 4 to reach your doctor's medical assistant. If no one answers, please leave a voicemail as directed and we will return your call as soon as possible. Messages left after 4 pm will be answered the following business day.   You may also send [place] a message via [place]. We typically respond to MyChart messages within 1-2 business days.  For prescription refills, please ask your pharmacy to contact our office. Our fax number is [phone].  If you have an urgent issue when the clinic is closed that cannot wait until the next business day, you can page your doctor at the number below.    Please note that while we do our best to be available for urgent issues outside of office hours, we are not available 24/7.   If you have an urgent issue and are unable to reach [place], you may choose to seek medical care at your doctor's office, retail clinic, urgent care center, or emergency room.  If you have a medical emergency, please immediately call 911 or go to the emergency department.  Pager Numbers  - Dr. Nehemiah Massed: [phone]  - Dr. Laurence Ferrari: [phone]  - Dr. Nicole Kindred: [phone]  In the event of inclement weather, please call our main line at [phone] for an update on the status of any delays or closures.  Dermatology Medication Tips: Please keep the boxes that topical medications come in in order to help keep track of the instructions about where and how to use these. Pharmacies typically print the medication instructions only on the boxes and not directly  on the medication tubes.   If your medication is too expensive, please contact our office at [phone] option 4 or send [place] a message through [place].   We are unable to tell what your co-pay for medications will be in advance as this is different depending on your insurance coverage. However, we may be able to find a substitute medication at lower cost or fill out paperwork to get insurance to cover a needed medication.    If a prior authorization is required to get your medication covered by your insurance company, please allow [place] 1-2 business days to complete this process.  Drug prices often vary depending on where the prescription is filled and some pharmacies may offer cheaper prices.  The website www.goodrx.com contains coupons for medications through different pharmacies. The prices here do not account for what the cost may be with help from insurance (it may be cheaper with your insurance), but the website can give you the price if you did not use any insurance.  - You can print the associated coupon and take it with your prescription to the pharmacy.  - You may also stop by our office during regular business hours and pick up a GoodRx coupon card.  - If you need your prescription sent electronically to a different pharmacy, notify our office through [facility] or by phone at [phone] option 4.     Si Usted Necesita Algo Despus de Su Visita  Tambin puede enviarnos un mensaje a travs de Pharmacist, community. Por lo general respondemos a los mensajes de MyChart en el transcurso de 1 a 2 das hbiles.  Para renovar recetas, por favor pida a su farmacia que se ponga en contacto con nuestra oficina. Harland Dingwall de fax es [place] [phone].  Si tiene un asunto urgente cuando la clnica est cerrada y que no puede esperar hasta el siguiente da hbil, puede llamar/localizar a su doctor(a) al nmero que aparece a continuacin.   Por favor, tenga en cuenta que aunque hacemos todo lo posible para estar disponibles para asuntos urgentes fuera del horario de [place], no estamos disponibles las 24 horas del da, los 7 das de la [place].   Si tiene un problema urgente y no puede comunicarse con nosotros, puede optar por buscar atencin mdica  en el consultorio de su doctor(a), en una clnica privada, en un centro de atencin urgente o en una sala de emergencias.  Si tiene Engineering geologist, por favor  llame inmediatamente al 911 o vaya a la sala de emergencias.  Nmeros de bper  - Dr. Nehemiah Massed: [phone]  - Dra. Moye: [phone]  - Dra. Nicole Kindred: [phone]  En caso de inclemencias del [place], por favor llame a Johnsie Kindred principal al [phone] para una actualizacin sobre el [place] de cualquier retraso o cierre.  Consejos para la medicacin en dermatologa: Por favor, guarde las cajas en las que vienen los medicamentos de uso tpico para ayudarle a seguir las instrucciones sobre dnde y cmo usarlos. Las farmacias generalmente imprimen las instrucciones del medicamento slo en las cajas y no directamente en los tubos del [place].   Si su medicamento es muy caro, por favor, pngase en contacto con Zigmund Adil llamando al [phone] y presione la opcin 4 o envenos un mensaje a travs de Pharmacist, community.   No podemos decirle cul ser su copago por los medicamentos por adelantado ya que esto es diferente dependiendo de la cobertura de su seguro. Sin embargo, es posible que podamos encontrar un medicamento sustituto  a Electrical engineer un formulario para que el seguro cubra el medicamento que se considera necesario.   Si se requiere una autorizacin previa para que su compaa de seguros [place] su medicamento, por favor permtanos de 1 a 2 das hbiles para completar este proceso.  Los precios de los medicamentos varan con frecuencia dependiendo del Environmental consultant de dnde se surte la receta y alguna farmacias pueden ofrecer precios ms baratos.  El sitio web www.goodrx.com tiene cupones para medicamentos de Airline pilot. Los precios aqu no tienen en cuenta lo que podra costar con la ayuda del seguro (puede ser ms barato con su seguro), pero el sitio web puede darle el precio si no utiliz Research scientist (physical sciences).  - Puede imprimir el cupn correspondiente y llevarlo con su receta a la farmacia.  - Tambin puede pasar por nuestra oficina durante el horario de atencin regular y  Charity fundraiser una tarjeta de cupones de GoodRx.  - Si necesita que su receta se enve electrnicamente a una farmacia diferente, informe a nuestra oficina a travs de MyChart de [place] o por telfono llamando al [phone] y presione la opcin 4.

## 2022-01-03 DIAGNOSIS — Z89512 Acquired absence of left leg below knee: Secondary | ICD-10-CM | POA: Diagnosis not present

## 2022-01-12 DIAGNOSIS — Z794 Long term (current) use of insulin: Secondary | ICD-10-CM | POA: Diagnosis not present

## 2022-01-12 DIAGNOSIS — T148XXD Other injury of unspecified body region, subsequent encounter: Secondary | ICD-10-CM | POA: Diagnosis not present

## 2022-01-12 DIAGNOSIS — F419 Anxiety disorder, unspecified: Secondary | ICD-10-CM | POA: Diagnosis not present

## 2022-01-12 DIAGNOSIS — I10 Essential (primary) hypertension: Secondary | ICD-10-CM | POA: Diagnosis not present

## 2022-01-12 DIAGNOSIS — Z7984 Long term (current) use of oral hypoglycemic drugs: Secondary | ICD-10-CM | POA: Diagnosis not present

## 2022-01-12 DIAGNOSIS — Z89512 Acquired absence of left leg below knee: Secondary | ICD-10-CM | POA: Diagnosis not present

## 2022-01-12 DIAGNOSIS — E119 Type 2 diabetes mellitus without complications: Secondary | ICD-10-CM | POA: Diagnosis not present

## 2022-01-12 DIAGNOSIS — Z4781 Encounter for orthopedic aftercare following surgical amputation: Secondary | ICD-10-CM | POA: Diagnosis not present

## 2022-01-12 DIAGNOSIS — I739 Peripheral vascular disease, unspecified: Secondary | ICD-10-CM | POA: Diagnosis not present

## 2022-01-12 DIAGNOSIS — F32A Depression, unspecified: Secondary | ICD-10-CM | POA: Diagnosis not present

## 2022-01-16 ENCOUNTER — Telehealth (INDEPENDENT_AMBULATORY_CARE_PROVIDER_SITE_OTHER): Payer: HMO | Admitting: Psychiatry

## 2022-01-16 ENCOUNTER — Encounter: Payer: Self-pay | Admitting: Psychiatry

## 2022-01-16 DIAGNOSIS — F411 Generalized anxiety disorder: Secondary | ICD-10-CM

## 2022-01-16 DIAGNOSIS — F5101 Primary insomnia: Secondary | ICD-10-CM

## 2022-01-16 DIAGNOSIS — Z634 Disappearance and death of family member: Secondary | ICD-10-CM | POA: Diagnosis not present

## 2022-01-16 DIAGNOSIS — F3342 Major depressive disorder, recurrent, in full remission: Secondary | ICD-10-CM | POA: Diagnosis not present

## 2022-01-16 MED ORDER — OLANZAPINE 7.5 MG PO TABS: ORAL_TABLET | ORAL | 0 refills | Status: DC

## 2022-01-16 MED ORDER — VENLAFAXINE HCL ER 150 MG PO CP24: 150.0000 mg | ORAL_CAPSULE | Freq: Every day | ORAL | 0 refills | Status: DC

## 2022-01-16 MED ORDER — BUPROPION HCL ER (XL) 300 MG PO TB24: ORAL_TABLET | ORAL | 0 refills | Status: DC

## 2022-01-16 MED ORDER — ZOLPIDEM TARTRATE 10 MG PO TABS: 10.0000 mg | ORAL_TABLET | Freq: Every evening | ORAL | 2 refills | Status: DC | PRN

## 2022-01-16 MED ORDER — CLONAZEPAM 1 MG PO TABS: 1.0000 mg | ORAL_TABLET | Freq: Two times a day (BID) | ORAL | 2 refills | Status: DC | PRN

## 2022-01-16 NOTE — Progress Notes (Unsigned)
Virtual Visit via Video Note  I connected with [name] on 01/16/22 at  3:30 PM EDT by a video enabled telemedicine application and verified that I am speaking with the correct person using two identifiers.   Location Provider Location : [facility] Patient Location : Home  Participants: Patient , Provider  I discussed the limitations of evaluation and management by telemedicine and the availability of in person appointments. The patient expressed understanding and agreed to proceed.   I discussed the assessment and treatment plan with the patient. The patient was provided an opportunity to ask questions and all were answered. The patient agreed with the plan and demonstrated an understanding of the instructions.   The patient was advised to call back or seek an in-person evaluation if the symptoms worsen or if the condition fails to improve as anticipated.  Video connection was lost at less than 50% of the duration of the visit, at which time the remainder of the visit was completed through audio only      [facility] MD OP Progress Note  01/16/2022 4:03 PM RECE ZECHMAN  MRN:  [id number]  Chief Complaint:  Chief Complaint  Patient presents with   Follow-up: 61 year old Caucasian male, widowed, has a history of depression, anxiety, multiple medical problems including history of tracheal stenosis, bilateral below-knee amputation, presented for medication management.   HPI: [name] is a Caucasian male, widowed, lives in [place], has a history of depression, insomnia, history of tracheal stenosis status post multiple reintubation, respiratory failure due to opioid overdose, history of CVA, right-sided hemiparesis, diabetes mellitus, hypertension, NSTEMI, hypothyroidism, chronic pain was evaluated by telemedicine today.  Patient today reports overall mood symptoms are stable.  Reports he does worry about the things that he is unable to do due to his bilateral below-knee amputation.   Patient however reports he looks forward to getting his prosthetic fitted next week.  He is also starting physical therapy soon.  Continues to have good support system from his parents.  Patient reports sleep as restless however unable to elaborate further, unable to give his bedtime or his wake-up time.  Agrees to keep a log of his sleep and bring it to next session.  Does have zolpidem and olanzapine available both of which helps with sleep.  Denies side effects.  Reports once he is awake in the morning he does not take any naps during the day.  Patient currently denies any suicidality, homicidality or perceptual disturbances.  Reports compliance with medications, denies side effects.  Patient denies any other concerns today.  Visit Diagnosis:    ICD-10-CM   1. MDD (major depressive disorder), recurrent, in full remission ([place])  F33.42     2. GAD (generalized anxiety disorder)  F41.1 clonazePAM (KLONOPIN) 1 MG tablet    3. Primary insomnia  F51.01 zolpidem (AMBIEN) 10 MG tablet    4. Bereavement  Z63.4 venlafaxine XR (EFFEXOR-XR) 150 MG 24 hr capsule    OLANZapine (ZYPREXA) 7.5 MG tablet    buPROPion (WELLBUTRIN XL) 300 MG 24 hr tablet      Past Psychiatric History: Reviewed past psychiatric history from progress note on 01/15/2019.  Past trials of fluoxetine, Rexulti, Xanax, Abilify.  Completed TMS-04/05/2020.  Patient with [facility] sessions in the past prior to that.  History of ECT-did not tolerate it.  Multiple inpatient mental health admissions in 2020.  Possible remote history of suicide attempt in 2014 however reports it was accidental and did not intend to kill himself.  Past Medical History:  Past Medical History:  Diagnosis Date   Depression    Diabetes ([place])    Insulin Pump   Diabetes mellitus type I ([place])    Diabetes mellitus without complication ([place])    GERD (gastroesophageal reflux disease)    H/O laryngectomy    Heel bone fracture    Hyperlipidemia    Hypertension     Radicular pain of right lower extremity    Stroke ([place])    Suicide attempt ([place]) 2014   damaged larynx - tracheostomy   Thyroid disease     Past Surgical History:  Procedure Laterality Date   COLONOSCOPY WITH PROPOFOL N/A 05/15/2018   Procedure: COLONOSCOPY WITH PROPOFOL;  Surgeon: [name], [name], MD;  Location: [facility] ENDOSCOPY;  Service: Gastroenterology;  Laterality: N/A;   ESOPHAGOGASTRODUODENOSCOPY N/A 05/15/2018   Procedure: ESOPHAGOGASTRODUODENOSCOPY (EGD);  Surgeon: [name], [name], MD;  Location: [facility] ENDOSCOPY;  Service: Gastroenterology;  Laterality: N/A;   FRACTURE SURGERY     Heel bone reconstruction Left    HERNIA REPAIR  [id number]/2778   Umbilical hernia repair    LARYNGECTOMY     LOWER EXTREMITY ANGIOGRAPHY Left 01/31/2021   Procedure: LOWER EXTREMITY ANGIOGRAPHY;  Surgeon: Algernon Huxley, MD;  Location: [place] [facility];  Service: Cardiovascular;  Laterality: Left;   LOWER EXTREMITY ANGIOGRAPHY Right 02/14/2021   Procedure: LOWER EXTREMITY ANGIOGRAPHY;  Surgeon: Algernon Huxley, MD;  Location: [place] [facility];  Service: Cardiovascular;  Laterality: Right;   NECK SURGERY     fusion   SPINE SURGERY     TRACHEOSTOMY  2014   from [place] attempt    Family Psychiatric History: Reviewed family psychiatric history from progress note on 01/15/2019.  Family History:  Family History  Problem Relation Age of Onset   Osteoporosis Mother    Diabetes Mother    Hypertension Father    Mental illness Neg Hx     Social History: Reviewed social history from progress note on 01/15/2019. Social History   Socioeconomic History   Marital status: Widowed    Spouse name: [name]   Number of children: Not on file   Years of education: Not on file   Highest education level: Not on file  Occupational History   Not on file  Tobacco Use   Smoking status: Former    Packs/day: 0.00    Types: Cigarettes    Quit date: 04/09/2013    Years since quitting: 8.7   Smokeless tobacco: Never  Vaping  Use   Vaping Use: Never used  Substance and Sexual Activity   Alcohol use: Yes    Alcohol/week: 2.0 standard drinks of alcohol    Types: 2 Shots of liquor per week    Comment: rare   Drug use: No    Comment: Pt denied; UDS not available   Sexual activity: Yes    Partners: Female    Birth control/protection: Condom  Other Topics Concern   Not on file  Social History Narrative   Not on file   Social Determinants of Health   Financial Resource Strain: Not on file  Food Insecurity: Not on file  Transportation Needs: Not on file  Physical Activity: Not on file  Stress: Not on file  Social Connections: Not on file    Allergies:  Allergies  Allergen Reactions   Buspar [Buspirone]     Makes the patient "flip out"   Depakote [Valproic Acid]     Causes excessive drowsiness   Clopidogrel Rash  Gabapentin Itching and Rash    Metabolic Disorder Labs: Lab Results  Component Value Date   HGBA1C 7.8 (H) 02/14/2021   MPG 177 02/14/2021   MPG 177.16 01/01/2019   No results found for: "PROLACTIN" Lab Results  Component Value Date   CHOL 161 09/15/2016   TRIG 190 (H) 09/15/2016   HDL 41 09/15/2016   CHOLHDL 3.9 09/15/2016   VLDL 38 09/15/2016   LDLCALC 82 09/15/2016   Lab Results  Component Value Date   TSH 1.952 09/15/2016    Therapeutic Level Labs: No results found for: "LITHIUM" Lab Results  Component Value Date   VALPROATE 19 (L) 09/12/2016   No results found for: "CBMZ"  Current Medications: Current Outpatient Medications  Medication Sig Dispense Refill   amLODipine (NORVASC) 5 MG tablet Take 1 tablet (5 mg total) by mouth daily. 30 tablet 1   apixaban (ELIQUIS) 5 MG TABS tablet Take 1 tablet (5 mg total) by mouth 2 (two) times daily. 60 tablet 5   carvedilol (COREG) 25 MG tablet Take 1 tablet by mouth 2 (two) times daily.     empagliflozin (JARDIANCE) 25 MG TABS tablet Take 25 mg by mouth daily. 30 tablet 1   insulin aspart (NOVOLOG) 100 UNIT/ML injection  INJECT UP TO 120 UNITS UNDER THE SKIN AS DIRECTED DAILY VIA INSULIN PUMP     ketoconazole (NIZORAL) 2 % cream Apply topically.     levothyroxine (SYNTHROID) 75 MCG tablet TAKE 1 TABLET BY MOUTH DAILY AT 6 AM 90 tablet 0   metoprolol succinate (TOPROL-XL) 25 MG 24 hr tablet TAKE 1 TABLET(25 MG) BY MOUTH EVERY DAY     mometasone (ELOCON) 0.1 % cream Apply to rash on arms and chest twice daily until improved. 45 g 1   nystatin cream (MYCOSTATIN) Apply topically.     pantoprazole (PROTONIX) 40 MG tablet TAKE 1 TABLET(40 MG) BY MOUTH TWICE DAILY     rosuvastatin (CRESTOR) 20 MG tablet Take 20 mg by mouth at bedtime.     triamcinolone cream (KENALOG) 0.1 % Apply topically 2 (two) times daily.     buPROPion (WELLBUTRIN XL) 300 MG 24 hr tablet TAKE 1 TABLET(300 MG) BY MOUTH DAILY WITH BREAKFAST 90 tablet 0   [START ON 02/09/2022] clonazePAM (KLONOPIN) 1 MG tablet Take 1 tablet (1 mg total) by mouth 2 (two) times daily as needed for anxiety. 60 tablet 2   ketoconazole (NIZORAL) 2 % cream Apply to groin and inner thighs once a day for rash. (Patient not taking: Reported on 01/16/2022) 60 g 3   OLANZapine (ZYPREXA) 7.5 MG tablet TAKE 1 TABLET(7.5 MG) BY MOUTH AT BEDTIME 90 tablet 0   sodium hypochlorite (DAKIN'S 1/4 STRENGTH) 0.125 % SOLN USE IN WET TO DRY DRESSINGS (Patient not taking: Reported on 01/16/2022)     venlafaxine XR (EFFEXOR-XR) 150 MG 24 hr capsule Take 1 capsule (150 mg total) by mouth daily with breakfast. 90 capsule 0   [START ON 01/25/2022] zolpidem (AMBIEN) 10 MG tablet Take 1 tablet (10 mg total) by mouth at bedtime as needed for sleep. 30 tablet 2   No current facility-administered medications for this visit.     Musculoskeletal: Strength & Muscle Tone:  UTA Gait & Station:  UTA Patient leans: N/A  Psychiatric Specialty Exam: Review of Systems  Musculoskeletal:        Status post left below-knee amputation.  Psychiatric/Behavioral:  Positive for sleep disturbance. The patient is  nervous/anxious.   All other systems reviewed and are  negative.   There were no vitals taken for this visit.There is no Holder or weight on file to calculate BMI.  General Appearance:  UTA  Eye Contact:   UTA  Speech:  Slow  Volume:  Normal  Mood:  Anxious coping well  Affect:   UTA  Thought Process:  Goal Directed and Descriptions of Associations: Intact  Orientation:  Full (Time, Place, and Person)  Thought Content: Logical   Suicidal Thoughts:  No  Homicidal Thoughts:  No  Memory:  Immediate;   Fair Recent;   Fair Remote;   Fair  Judgement:  Fair  Insight:  Fair  Psychomotor Activity:   UTA  Concentration:  Concentration: Fair and Attention Span: Fair  Recall:  [employer] of Knowledge: Fair  Language: Fair  Akathisia:  No  Handed:  Right  AIMS (if indicated): not done  Assets:  Communication Skills Desire for Improvement Social Support Talents/Skills Transportation  ADL's:  Intact  Cognition: WNL  Sleep:   Restless   Screenings: AIMS    Flowsheet Row Video Visit from 10/31/2021 in [place] Office Visit from 08/30/2021 in [place] Visit from 07/05/2021 in [place] Office Visit from 08/03/2020 in [place] Total Score 0 0 0 0      AUDIT    Flowsheet Row Admission (Discharged) from 01/22/2019 in [place] Admission (Discharged) from 12/31/2018 in [facility] Admission (Discharged) from 09/14/2016 in [facility]  Alcohol Use Disorder Identification Test Final Score (AUDIT) 0 0 1      ECT-MADRS    Flowsheet Row Admission (Discharged) from 01/22/2019 in [facility] Total Score 27      GAD-7    Flowsheet Row Video Visit from 01/16/2022 in [facility]  Total GAD-7 Score 0      [place] Admission (Discharged) from 01/22/2019 in [place]  Total Score (max 30 points ) 30      PHQ2-9    Flowsheet Row Video Visit from 01/16/2022 in [place] [facility] Video Visit from 10/31/2021 in [place] Visit from 08/30/2021 in [place] Office Visit from 07/05/2021 in [place] Office Visit from 06/02/2021 in [facility]  PHQ-2 Total Score 0 '4 1 3 5  '$ PHQ-9 Total Score -- '4 4 8 15      '$ [place] Office Visit from 07/05/2021 in [place] Office Visit from 06/02/2021 in [facility] Admission (Discharged) from 02/14/2021 in [facility]  C-SSRS RISK CATEGORY Error: Q3, 4, or 5 should not be populated when Q2 is No Low Risk No Risk        Assessment and Plan: JAHLANI LORENTZ is a 61 year old Caucasian male, married, disability, history of multiple medical problems, MDD, GAD was evaluated by telemedicine today.  Patient with sleep problems, will benefit from the following plan.  Plan MDD in remission Effexor extended release 150 mg p.o. daily Wellbutrin XL 300 mg p.o. daily Olanzapine 7.5 mg p.o. nightly  GAD-stable Effexor extended release 150 mg p.o. daily Klonopin 1 mg p.o. twice daily as needed advised to limit use Reviewed [place] PMP aware Continue CBT as needed.  Insomnia-unstable Patient to keep a sleep diary. Unable to elaborate much about what time he goes to bed, what  time he is wakes up and so on. Discussed sleep hygiene techniques. Continue Ambien 10 mg.  Nightly  Bereavement-improving Continue CBT  Collateral information obtained from mother who denies any concerns.  Follow-up in clinic in 2 to 3 months or sooner in person.   I have spent at least 23 minutes non face to face with patient today  .      Collaboration of Care: Collaboration of Care: Referral or follow-up with counselor/therapist AEB encouraged to follow up with therapist.  Patient/Guardian was advised Release of Information must be obtained prior to any record release in order to collaborate their care with an outside provider. Patient/Guardian was advised if they have not already done so to contact the registration department to sign all necessary forms in order for [place] to release information regarding their care.   Consent: Patient/Guardian gives verbal consent for treatment and assignment of benefits for services provided during this visit. Patient/Guardian expressed understanding and agreed to proceed.   This note was generated in part or whole with voice recognition software. Voice recognition is usually quite accurate but there are transcription errors that can and very often do occur. I apologize for any typographical errors that were not detected and corrected.      Ursula Alert, MD 01/16/2022, 4:03 PM

## 2022-01-19 DIAGNOSIS — R809 Proteinuria, unspecified: Secondary | ICD-10-CM | POA: Diagnosis not present

## 2022-01-19 DIAGNOSIS — E1029 Type 1 diabetes mellitus with other diabetic kidney complication: Secondary | ICD-10-CM | POA: Diagnosis not present

## 2022-01-24 ENCOUNTER — Other Ambulatory Visit: Payer: Self-pay | Admitting: Psychiatry

## 2022-01-24 DIAGNOSIS — F5101 Primary insomnia: Secondary | ICD-10-CM

## 2022-01-27 ENCOUNTER — Telehealth: Payer: Self-pay

## 2022-01-27 DIAGNOSIS — Z89512 Acquired absence of left leg below knee: Secondary | ICD-10-CM | POA: Diagnosis not present

## 2022-01-27 NOTE — Telephone Encounter (Signed)
left message that all his medicatio were sent to the pharmacy on 9-11and that he needed to contact pharmacy and speak with someone to see if they put on hold.

## 2022-01-27 NOTE — Telephone Encounter (Signed)
looked into EPIC pt should have refills on all his medication that is given by dr. Shea Evans. the rxs' were sent on 01-27-22

## 2022-01-27 NOTE — Telephone Encounter (Signed)
pt called left message that he needed refills on all his medications.

## 2022-02-01 DIAGNOSIS — Z89512 Acquired absence of left leg below knee: Secondary | ICD-10-CM | POA: Diagnosis not present

## 2022-02-02 DIAGNOSIS — Z0289 Encounter for other administrative examinations: Secondary | ICD-10-CM | POA: Diagnosis not present

## 2022-02-03 DIAGNOSIS — Z89512 Acquired absence of left leg below knee: Secondary | ICD-10-CM | POA: Diagnosis not present

## 2022-02-07 DIAGNOSIS — Z89512 Acquired absence of left leg below knee: Secondary | ICD-10-CM | POA: Diagnosis not present

## 2022-02-10 DIAGNOSIS — Z89512 Acquired absence of left leg below knee: Secondary | ICD-10-CM | POA: Diagnosis not present

## 2022-02-13 DIAGNOSIS — Z09 Encounter for follow-up examination after completed treatment for conditions other than malignant neoplasm: Secondary | ICD-10-CM | POA: Diagnosis not present

## 2022-02-13 DIAGNOSIS — Z89512 Acquired absence of left leg below knee: Secondary | ICD-10-CM | POA: Diagnosis not present

## 2022-02-13 DIAGNOSIS — Z8679 Personal history of other diseases of the circulatory system: Secondary | ICD-10-CM | POA: Diagnosis not present

## 2022-02-13 DIAGNOSIS — Z8739 Personal history of other diseases of the musculoskeletal system and connective tissue: Secondary | ICD-10-CM | POA: Diagnosis not present

## 2022-02-13 DIAGNOSIS — Z930 Tracheostomy status: Secondary | ICD-10-CM | POA: Diagnosis not present

## 2022-02-13 DIAGNOSIS — Z9714 Presence of artificial left leg (complete) (partial): Secondary | ICD-10-CM | POA: Diagnosis not present

## 2022-03-06 ENCOUNTER — Encounter (INDEPENDENT_AMBULATORY_CARE_PROVIDER_SITE_OTHER): Payer: Self-pay

## 2022-04-05 ENCOUNTER — Ambulatory Visit (INDEPENDENT_AMBULATORY_CARE_PROVIDER_SITE_OTHER): Payer: HMO | Admitting: Psychiatry

## 2022-04-05 ENCOUNTER — Encounter: Payer: Self-pay | Admitting: Psychiatry

## 2022-04-05 VITALS — BP 164/74 | HR 82 | Temp 98.5°F | Ht 71.0 in | Wt 297.0 lb

## 2022-04-05 DIAGNOSIS — F411 Generalized anxiety disorder: Secondary | ICD-10-CM | POA: Diagnosis not present

## 2022-04-05 DIAGNOSIS — F5101 Primary insomnia: Secondary | ICD-10-CM | POA: Diagnosis not present

## 2022-04-05 DIAGNOSIS — F3342 Major depressive disorder, recurrent, in full remission: Secondary | ICD-10-CM | POA: Diagnosis not present

## 2022-04-05 DIAGNOSIS — Z634 Disappearance and death of family member: Secondary | ICD-10-CM

## 2022-04-05 MED ORDER — OLANZAPINE 7.5 MG PO TABS: 7.5000 mg | ORAL_TABLET | Freq: Every evening | ORAL | 0 refills | Status: DC | PRN

## 2022-04-05 MED ORDER — CLONAZEPAM 1 MG PO TABS: 1.0000 mg | ORAL_TABLET | ORAL | 2 refills | Status: DC

## 2022-04-05 MED ORDER — ZOLPIDEM TARTRATE 10 MG PO TABS: 10.0000 mg | ORAL_TABLET | Freq: Every evening | ORAL | 2 refills | Status: DC | PRN

## 2022-04-05 MED ORDER — VENLAFAXINE HCL ER 150 MG PO CP24: 150.0000 mg | ORAL_CAPSULE | Freq: Every day | ORAL | 0 refills | Status: DC

## 2022-04-05 MED ORDER — BUPROPION HCL ER (XL) 300 MG PO TB24: ORAL_TABLET | ORAL | 0 refills | Status: DC

## 2022-04-05 NOTE — Progress Notes (Signed)
[facility] MD OP Progress Note  04/05/2022 2:55 PM [name]  MRN:  [id number]  Chief Complaint:  Chief Complaint  Patient presents with   Follow-up   Anxiety   Depression   HPI: [name] is a 61 year old Caucasian male, widowed, lives in [place], has a history of depression, anxiety, insomnia, history of tracheal stenosis status post multiple reintubation, respiratory failure due to opioid overdose, history of CVA with right-sided hemiparesis, diabetes mellitus, hypertension, NSTEMI, hypothyroidism, chronic pain was evaluated in office today.  Patient reports he is currently trying to walk on his new prosthetics.  Reports he gets tired very easily however he is trying his best.  Patient reports overall mood symptoms as good.  Denies any significant sadness.  He is able to manage his anxiety better.  Reports he has good support system from a friend and someone from church who comes over and provides counseling.  His mother is also supportive.  He however reports his dad has been diagnosed with cancer and his mother is currently trying to support him.  Patient reports he is currently coping okay with his grief.  Reports sleep is overall good.  He was able to get a new mattress and that helped.  Reports he would like to lose some weight and his primary care provider brought to his attention that olanzapine could cause weight gain.  He is interested in being tapered off of the olanzapine.  Currently compliant on medications, denies side effects.  Reports he has been using clonazepam a few times a week only.  Currently trying to limit use.  Patient appeared to be alert, oriented to person place time and situation in session.  3 word memory immediate 3 out of 3, after 5 minutes, 2 out of 3.  Patient was able to do serial sevens, attention and focus seem to be good.  Visit Diagnosis:    ICD-10-CM   1. MDD (major depressive disorder), recurrent, in full remission ([place])  F33.42     2.  GAD (generalized anxiety disorder)  F41.1 clonazePAM (KLONOPIN) 1 MG tablet    3. Primary insomnia  F51.01 zolpidem (AMBIEN) 10 MG tablet    4. Bereavement  Z63.4 OLANZapine (ZYPREXA) 7.5 MG tablet    venlafaxine XR (EFFEXOR-XR) 150 MG 24 hr capsule    buPROPion (WELLBUTRIN XL) 300 MG 24 hr tablet      Past Psychiatric History: Reviewed past psychiatric history from progress note on 01/15/2019.  Past trials of fluoxetine, Rexulti, Xanax, Abilify.  Completed TMS-04/05/2020.  Patient with [facility] session in the past prior to that.  History of ECT-did not tolerate it.  Multiple inpatient mental health admissions in 2020.  Possible remote history of suicide attempt in 2014 however reports it was accidental and did not intend to kill himself.  Past Medical History:  Past Medical History:  Diagnosis Date   Depression    Diabetes ([place])    Insulin Pump   Diabetes mellitus type I ([place])    Diabetes mellitus without complication ([place])    GERD (gastroesophageal reflux disease)    H/O laryngectomy    Heel bone fracture    Hyperlipidemia    Hypertension    Radicular pain of right lower extremity    Stroke ([place])    Suicide attempt ([place]) 2014   damaged larynx - tracheostomy   Thyroid disease     Past Surgical History:  Procedure Laterality Date   COLONOSCOPY WITH PROPOFOL N/A 05/15/2018   Procedure: COLONOSCOPY WITH PROPOFOL;  Surgeon: [name], [name], MD;  Location: [facility] ENDOSCOPY;  Service: Gastroenterology;  Laterality: N/A;   ESOPHAGOGASTRODUODENOSCOPY N/A 05/15/2018   Procedure: ESOPHAGOGASTRODUODENOSCOPY (EGD);  Surgeon: [name], [name], MD;  Location: [facility] ENDOSCOPY;  Service: Gastroenterology;  Laterality: N/A;   FRACTURE SURGERY     Heel bone reconstruction Left    HERNIA REPAIR  [id number]   Umbilical hernia repair    LARYNGECTOMY     LOWER EXTREMITY ANGIOGRAPHY Left 01/31/2021   Procedure: LOWER EXTREMITY ANGIOGRAPHY;  Surgeon: Algernon Huxley, MD;  Location: [facility];  Service:  Cardiovascular;  Laterality: Left;   LOWER EXTREMITY ANGIOGRAPHY Right 02/14/2021   Procedure: LOWER EXTREMITY ANGIOGRAPHY;  Surgeon: Algernon Huxley, MD;  Location: [facility];  Service: Cardiovascular;  Laterality: Right;   NECK SURGERY     fusion   SPINE SURGERY     TRACHEOSTOMY  2014   from [place] attempt    Family Psychiatric History: Reviewed family psychiatric history from progress note on 01/15/2019.  Family History:  Family History  Problem Relation Age of Onset   Osteoporosis Mother    Diabetes Mother    Hypertension Father    Mental illness Neg Hx     Social History: Reviewed social history from progress note on 01/15/2019. Social History   Socioeconomic History   Marital status: Widowed    Spouse name: [name]   Number of children: Not on file   Years of education: Not on file   Highest education level: Not on file  Occupational History   Not on file  Tobacco Use   Smoking status: Former    Packs/day: 0.00    Types: Cigarettes    Quit date: 04/09/2013    Years since quitting: 8.9   Smokeless tobacco: Never  Vaping Use   Vaping Use: Never used  Substance and Sexual Activity   Alcohol use: Yes    Alcohol/week: 2.0 standard drinks of alcohol    Types: 2 Shots of liquor per week    Comment: rare   Drug use: No    Comment: Pt denied; UDS not available   Sexual activity: Yes    Partners: Female    Birth control/protection: Condom  Other Topics Concern   Not on file  Social History Narrative   Not on file   Social Determinants of Health   Financial Resource Strain: Not on file  Food Insecurity: Not on file  Transportation Needs: Not on file  Physical Activity: Not on file  Stress: Not on file  Social Connections: Not on file    Allergies:  Allergies  Allergen Reactions   Buspar [Buspirone]     Makes the patient "flip out"   Depakote [Valproic Acid]     Causes excessive drowsiness   Clopidogrel Rash   Gabapentin Itching and Rash     Metabolic Disorder Labs: Lab Results  Component Value Date   HGBA1C 7.8 (H) 02/14/2021   MPG 177 02/14/2021   MPG 177.16 01/01/2019   No results found for: "PROLACTIN" Lab Results  Component Value Date   CHOL 161 09/15/2016   TRIG 190 (H) 09/15/2016   HDL 41 09/15/2016   CHOLHDL 3.9 09/15/2016   VLDL 38 09/15/2016   LDLCALC 82 09/15/2016   Lab Results  Component Value Date   TSH 1.952 09/15/2016    Therapeutic Level Labs: No results found for: "LITHIUM" Lab Results  Component Value Date   VALPROATE 19 (L) 09/12/2016   No results found for: "CBMZ"  Current Medications: Current Outpatient Medications  Medication Sig Dispense Refill   amLODipine (NORVASC) 5 MG tablet Take 1 tablet (5 mg total) by mouth daily. 30 tablet 1   apixaban (ELIQUIS) 5 MG TABS tablet Take 1 tablet (5 mg total) by mouth 2 (two) times daily. 60 tablet 5   carvedilol (COREG) 25 MG tablet Take 1 tablet by mouth 2 (two) times daily.     empagliflozin (JARDIANCE) 25 MG TABS tablet Take 25 mg by mouth daily. 30 tablet 1   HUMALOG 100 UNIT/ML injection SMARTSIG:0-120 Unit(s) SUB-Q Daily     insulin aspart (NOVOLOG) 100 UNIT/ML injection INJECT UP TO 120 UNITS UNDER THE SKIN AS DIRECTED DAILY VIA INSULIN PUMP     ketoconazole (NIZORAL) 2 % cream Apply topically.     ketoconazole (NIZORAL) 2 % cream Apply to groin and inner thighs once a day for rash. 60 g 3   levothyroxine (SYNTHROID) 75 MCG tablet TAKE 1 TABLET BY MOUTH DAILY AT 6 AM 90 tablet 0   losartan-hydrochlorothiazide (HYZAAR) 100-25 MG tablet Take 1 tablet by mouth daily.     metoprolol succinate (TOPROL-XL) 25 MG 24 hr tablet TAKE 1 TABLET(25 MG) BY MOUTH EVERY DAY     mometasone (ELOCON) 0.1 % cream Apply to rash on arms and chest twice daily until improved. 45 g 1   nystatin cream (MYCOSTATIN) Apply topically.     pantoprazole (PROTONIX) 40 MG tablet TAKE 1 TABLET(40 MG) BY MOUTH TWICE DAILY     pregabalin (LYRICA) 50 MG capsule Take by  mouth.     rosuvastatin (CRESTOR) 20 MG tablet Take 20 mg by mouth at bedtime.     sodium hypochlorite (DAKIN'S 1/4 STRENGTH) 0.125 % SOLN      triamcinolone cream (KENALOG) 0.1 % Apply topically 2 (two) times daily.     buPROPion (WELLBUTRIN XL) 300 MG 24 hr tablet TAKE 1 TABLET(300 MG) BY MOUTH DAILY WITH BREAKFAST 90 tablet 0   clonazePAM (KLONOPIN) 1 MG tablet Take 1 tablet (1 mg total) by mouth as directed. Take 1 tablet once a day as needed up to 3 times a week 21 tablet 2   OLANZapine (ZYPREXA) 7.5 MG tablet Take 1 tablet (7.5 mg total) by mouth at bedtime as needed. 90 tablet 0   venlafaxine XR (EFFEXOR-XR) 150 MG 24 hr capsule Take 1 capsule (150 mg total) by mouth daily with breakfast. 90 capsule 0   zolpidem (AMBIEN) 10 MG tablet Take 1 tablet (10 mg total) by mouth at bedtime as needed for sleep. 30 tablet 2   No current facility-administered medications for this visit.     Musculoskeletal: Strength & Muscle Tone: within normal limits Gait & Station:  walks slow , with prosthetics on left side, has a cane  Patient leans: N/A  Psychiatric Specialty Exam: Review of Systems  Psychiatric/Behavioral:  The patient is nervous/anxious.   All other systems reviewed and are negative.   Blood pressure (!) 164/74, pulse 82, temperature 98.5 F (36.9 C), temperature source Oral, height '5\' 11"'$  (1.803 m), weight 297 lb (134.7 kg).Body mass index is 41.42 kg/m.  General Appearance: Casual  Eye Contact:  Fair  Speech:  Clear and Coherent  Volume:  Decreased status post tracheostomy-chronic  Mood:  Anxious coping well  Affect:  Appropriate  Thought Process:  Goal Directed and Descriptions of Associations: Intact  Orientation:  Full (Time, Place, and Person)  Thought Content: Logical   Suicidal Thoughts:  No  Homicidal Thoughts:  No  Memory:  Immediate;   Fair Recent;   Fair Remote;   Fair  Judgement:  Fair  Insight:  Fair  Psychomotor Activity:  Normal  Concentration:   Concentration: Fair and Attention Span: Fair  Recall:  [employer] of Knowledge: Fair  Language: Fair  Akathisia:  No  Handed:  Right  AIMS (if indicated): done  Assets:  Desire for Improvement Housing Social Support Transportation  ADL's:  Intact  Cognition: WNL  Sleep:  Fair   Screenings: [facility] Office Visit from 04/05/2022 in [facility] Video Visit from 10/31/2021 in [facility] Visit from 08/30/2021 in [facility] Visit from 07/05/2021 in [facility] Office Visit from 08/03/2020 in [facility] Total Score 0 0 0 0 0      AUDIT    Flowsheet Row Admission (Discharged) from 01/22/2019 in [facility] Admission (Discharged) from 12/31/2018 in [facility] Admission (Discharged) from 09/14/2016 in [facility]  Alcohol Use Disorder Identification Test Final Score (AUDIT) 0 0 1      ECT-MADRS    Flowsheet Row Admission (Discharged) from 01/22/2019 in [facility] Total Score 27      [place] Office Visit from 04/05/2022 in [facility] Video Visit from 01/16/2022 in [facility]  Total GAD-7 Score 3 0      Mini-Mental    Flowsheet Row Admission (Discharged) from 01/22/2019 in [facility]  Total Score (max 30 points ) 30      PHQ2-9    [place] Visit from 04/05/2022 in [facility] Video Visit from 01/16/2022 in [place] [facility] Video Visit from 10/31/2021 in [facility] Visit from 08/30/2021 in [facility] Office Visit from 07/05/2021 in [facility]  PHQ-2 Total  Score 2 0 '4 1 3  '$ PHQ-9 Total Score 6 -- '4 4 8      '$ [place] [facility] Visit from 04/05/2022 in [facility] Video Visit from 01/16/2022 in [facility] Office Visit from 07/05/2021 in [employer] Low Risk Low Risk Error: Q3, 4, or 5 should not be populated when Q2 is No        Assessment and Plan: [name] is a 61 year old Caucasian male, married, on disability, history of multiple medical problems, MDD, GAD was evaluated in office today.  Patient is currently stable, interested in coming off of the olanzapine due to weight gain side effects.  Patient will benefit from the following plan.  Plan MDD in remission Effexor extended release 150 mg p.o. daily Wellbutrin XL 300 mg p.o. daily Taper off olanzapine, patient advised to take 1/2 tablet of 7.5 mg for 2 weeks and then he could use it as needed.  GAD-stable Effexor extended release 150 mg p.o. daily Klonopin 1 mg p.o. up to 3 times a week as needed Continue CBT Reviewed [place] PMP AWARxE  Insomnia-stable Ambien 10 mg p.o. nightly  Weight gain secondary to psychotropics-unstable Will taper off olanzapine as noted above. Patient advised to be more active, diet management.  Bereavement-improving Continue grief counseling.  Follow-up in clinic in 2 months or sooner if needed.   This note was generated in part or whole with voice recognition software. Voice  recognition is usually quite accurate but there are transcription errors that can and very often do occur. I apologize for any typographical errors that were not detected and corrected.    Ursula Alert, MD 04/05/2022, 2:55 PM

## 2022-04-14 ENCOUNTER — Other Ambulatory Visit: Payer: Self-pay | Admitting: Psychiatry

## 2022-04-14 DIAGNOSIS — F5101 Primary insomnia: Secondary | ICD-10-CM

## 2022-04-17 ENCOUNTER — Other Ambulatory Visit: Payer: Self-pay | Admitting: Psychiatry

## 2022-04-17 DIAGNOSIS — F5101 Primary insomnia: Secondary | ICD-10-CM

## 2022-05-11 DIAGNOSIS — Z89512 Acquired absence of left leg below knee: Secondary | ICD-10-CM | POA: Diagnosis not present

## 2022-05-16 DIAGNOSIS — Z89512 Acquired absence of left leg below knee: Secondary | ICD-10-CM | POA: Diagnosis not present

## 2022-05-16 DIAGNOSIS — Z430 Encounter for attention to tracheostomy: Secondary | ICD-10-CM | POA: Diagnosis not present

## 2022-05-17 ENCOUNTER — Telehealth: Payer: Self-pay

## 2022-05-17 DIAGNOSIS — F5101 Primary insomnia: Secondary | ICD-10-CM

## 2022-05-17 DIAGNOSIS — F411 Generalized anxiety disorder: Secondary | ICD-10-CM

## 2022-05-17 MED ORDER — CLONAZEPAM 1 MG PO TABS: 1.0000 mg | ORAL_TABLET | ORAL | 2 refills | Status: DC

## 2022-05-17 MED ORDER — ZOLPIDEM TARTRATE 10 MG PO TABS: ORAL_TABLET | ORAL | 2 refills | Status: DC

## 2022-05-17 NOTE — Telephone Encounter (Signed)
Patient is not currently due for Wellbutrin or venlafaxine refills.  I have sent clonazepam and Ambien to [facility] pharmacy at [facility].

## 2022-05-17 NOTE — Telephone Encounter (Signed)
pt mother called states that with insurance change they also had to change pharmacy. [facility] [address]

## 2022-05-18 NOTE — Telephone Encounter (Signed)
pt was notified that rx for medication was sent to the pharmacy

## 2022-05-22 DIAGNOSIS — Z89512 Acquired absence of left leg below knee: Secondary | ICD-10-CM | POA: Diagnosis not present

## 2022-05-25 DIAGNOSIS — E1029 Type 1 diabetes mellitus with other diabetic kidney complication: Secondary | ICD-10-CM | POA: Diagnosis not present

## 2022-05-25 DIAGNOSIS — R809 Proteinuria, unspecified: Secondary | ICD-10-CM | POA: Diagnosis not present

## 2022-05-29 DIAGNOSIS — R809 Proteinuria, unspecified: Secondary | ICD-10-CM | POA: Diagnosis not present

## 2022-05-29 DIAGNOSIS — E1029 Type 1 diabetes mellitus with other diabetic kidney complication: Secondary | ICD-10-CM | POA: Diagnosis not present

## 2022-05-30 ENCOUNTER — Telehealth: Payer: Self-pay | Admitting: Psychiatry

## 2022-05-30 NOTE — Telephone Encounter (Signed)
Completed [employer] [facility] clinical pharmacist, clarification requested about medication-olanzapine.  Completed form provided to [facility] CMA to fax.

## 2022-05-31 ENCOUNTER — Other Ambulatory Visit: Payer: Self-pay | Admitting: Psychiatry

## 2022-05-31 DIAGNOSIS — Z634 Disappearance and death of family member: Secondary | ICD-10-CM

## 2022-06-06 ENCOUNTER — Encounter: Payer: Self-pay | Admitting: Psychiatry

## 2022-06-06 ENCOUNTER — Ambulatory Visit (INDEPENDENT_AMBULATORY_CARE_PROVIDER_SITE_OTHER): Payer: HMO | Admitting: Psychiatry

## 2022-06-06 ENCOUNTER — Telehealth: Payer: Self-pay

## 2022-06-06 VITALS — BP 192/72 | HR 79 | Temp 97.9°F | Ht 71.0 in | Wt 283.2 lb

## 2022-06-06 DIAGNOSIS — F5101 Primary insomnia: Secondary | ICD-10-CM | POA: Diagnosis not present

## 2022-06-06 DIAGNOSIS — W010XXA Fall on same level from slipping, tripping and stumbling without subsequent striking against object, initial encounter: Secondary | ICD-10-CM | POA: Diagnosis not present

## 2022-06-06 DIAGNOSIS — F411 Generalized anxiety disorder: Secondary | ICD-10-CM

## 2022-06-06 DIAGNOSIS — S2241XA Multiple fractures of ribs, right side, initial encounter for closed fracture: Secondary | ICD-10-CM | POA: Diagnosis not present

## 2022-06-06 DIAGNOSIS — R0781 Pleurodynia: Secondary | ICD-10-CM | POA: Diagnosis not present

## 2022-06-06 DIAGNOSIS — F3342 Major depressive disorder, recurrent, in full remission: Secondary | ICD-10-CM

## 2022-06-06 DIAGNOSIS — J9811 Atelectasis: Secondary | ICD-10-CM | POA: Diagnosis not present

## 2022-06-06 DIAGNOSIS — R110 Nausea: Secondary | ICD-10-CM | POA: Diagnosis not present

## 2022-06-06 DIAGNOSIS — S2231XA Fracture of one rib, right side, initial encounter for closed fracture: Secondary | ICD-10-CM | POA: Diagnosis not present

## 2022-06-06 DIAGNOSIS — R635 Abnormal weight gain: Secondary | ICD-10-CM

## 2022-06-06 DIAGNOSIS — I517 Cardiomegaly: Secondary | ICD-10-CM | POA: Diagnosis not present

## 2022-06-06 DIAGNOSIS — T50905A Adverse effect of unspecified drugs, medicaments and biological substances, initial encounter: Secondary | ICD-10-CM | POA: Diagnosis not present

## 2022-06-06 DIAGNOSIS — R918 Other nonspecific abnormal finding of lung field: Secondary | ICD-10-CM | POA: Diagnosis not present

## 2022-06-06 DIAGNOSIS — Z634 Disappearance and death of family member: Secondary | ICD-10-CM | POA: Diagnosis not present

## 2022-06-06 DIAGNOSIS — Y92009 Unspecified place in unspecified non-institutional (private) residence as the place of occurrence of the external cause: Secondary | ICD-10-CM | POA: Diagnosis not present

## 2022-06-06 MED ORDER — OLANZAPINE 7.5 MG PO TABS: 7.5000 mg | ORAL_TABLET | Freq: Every evening | ORAL | 0 refills | Status: DC | PRN

## 2022-06-06 MED ORDER — BUPROPION HCL ER (XL) 300 MG PO TB24: ORAL_TABLET | ORAL | 0 refills | Status: DC

## 2022-06-06 NOTE — Telephone Encounter (Signed)
fax completed form to [facility] form put in the scan basket

## 2022-06-06 NOTE — Telephone Encounter (Signed)
received fax medication review form from [facility] that was for the olanzapine for consideration of switching to antipscychotic with minimal risk of weigh gain

## 2022-06-06 NOTE — Progress Notes (Unsigned)
[facility] MD OP Progress Note  06/06/2022 5:36 PM [name]  MRN:  [id number]  Chief Complaint:  Chief Complaint  Patient presents with   Follow-up   Depression   Anxiety   Medication Refill   HPI: [name] is a 62 year old Caucasian male, widowed, lives in [place], has a history of depression, anxiety, insomnia, history of tracheal stenosis status post multiple reintubation, respiratory failure due to opiate overdose, history of CVA with right-sided hemiparesis, diabetes mellitus, hypertension,NSTEMI, hypothyroidism, chronic pain was evaluated in office today.  Patient today reports he recently had a fall since his prosthetics/legs gave away.  Patient reports he went to urgent care, was diagnosed with rib fracture.  He is currently in a lot of pain.  Patient reports due to the pain last night he could not sleep at all.  He also has not been taking his antihypertensive medications, did not take it last night or today.  His blood pressure also was elevated in session today.  Patient reports overall he was doing okay with regard to his mood until the fall.  He reports he was unable to taper off of the olanzapine completely and currently taking half tablet of the 7.5 mg.  He needs that for sleep now.  Patient denies any suicidality, homicidality or perceptual disturbances.  Patient denies any other concerns today.  Visit Diagnosis:    ICD-10-CM   1. MDD (major depressive disorder), recurrent, in full remission ([place])  F33.42     2. GAD (generalized anxiety disorder)  F41.1     3. Primary insomnia  F51.01     4. Bereavement  Z63.4 OLANZapine (ZYPREXA) 7.5 MG tablet    buPROPion (WELLBUTRIN XL) 300 MG 24 hr tablet    5. Weight gain due to medication  R63.5    T50.905A       Past Psychiatric History: Reviewed past psychiatric history from progress note on 01/15/2019.  Past trials of fluoxetine, Rexulti, Xanax, Abilify.  Completed TMS-04/05/2020.  Patient with [facility] sessions in the  past prior to 04/05/2020.  History of ECT-did not tolerate it.  Multiple inpatient mental health admissions in 2020.  Possible remote history of suicide attempts in 2014 however reports it was accidental and did not intend to kill himself.  Past Medical History:  Past Medical History:  Diagnosis Date   Depression    Diabetes ([place])    Insulin Pump   Diabetes mellitus type I ([place])    Diabetes mellitus without complication ([place])    GERD (gastroesophageal reflux disease)    H/O laryngectomy    Heel bone fracture    Hyperlipidemia    Hypertension    Radicular pain of right lower extremity    Stroke ([place])    Suicide attempt ([place]) 2014   damaged larynx - tracheostomy   Thyroid disease     Past Surgical History:  Procedure Laterality Date   COLONOSCOPY WITH PROPOFOL N/A 05/15/2018   Procedure: COLONOSCOPY WITH PROPOFOL;  Surgeon: [name], [name], MD;  Location: [facility] ENDOSCOPY;  Service: Gastroenterology;  Laterality: N/A;   ESOPHAGOGASTRODUODENOSCOPY N/A 05/15/2018   Procedure: ESOPHAGOGASTRODUODENOSCOPY (EGD);  Surgeon: [name], [name], MD;  Location: [facility] ENDOSCOPY;  Service: Gastroenterology;  Laterality: N/A;   FRACTURE SURGERY     Heel bone reconstruction Left    HERNIA REPAIR  [id number]   Umbilical hernia repair    LARYNGECTOMY     LOWER EXTREMITY ANGIOGRAPHY Left 01/31/2021   Procedure: LOWER EXTREMITY ANGIOGRAPHY;  Surgeon: Algernon Huxley, MD;  Location: [facility];  Service: Cardiovascular;  Laterality: Left;   LOWER EXTREMITY ANGIOGRAPHY Right 02/14/2021   Procedure: LOWER EXTREMITY ANGIOGRAPHY;  Surgeon: Algernon Huxley, MD;  Location: [facility];  Service: Cardiovascular;  Laterality: Right;   NECK SURGERY     fusion   SPINE SURGERY     TRACHEOSTOMY  2014   from [place] attempt    Family Psychiatric History: Reviewed family psychiatric history from progress note on 01/15/2019.  Family History:  Family History  Problem Relation Age of Onset   Osteoporosis  Mother    Diabetes Mother    Hypertension Father    Mental illness Neg Hx     Social History: Reviewed social history from progress note on 01/15/2019. Social History   Socioeconomic History   Marital status: Widowed    Spouse name: [name]   Number of children: Not on file   Years of education: Not on file   Highest education level: Not on file  Occupational History   Not on file  Tobacco Use   Smoking status: Former    Packs/day: 0.00    Types: Cigarettes    Quit date: 04/09/2013    Years since quitting: 9.1   Smokeless tobacco: Never  Vaping Use   Vaping Use: Never used  Substance and Sexual Activity   Alcohol use: Yes    Alcohol/week: 2.0 standard drinks of alcohol    Types: 2 Shots of liquor per week    Comment: rare   Drug use: No    Comment: Pt denied; UDS not available   Sexual activity: Yes    Partners: Female    Birth control/protection: Condom  Other Topics Concern   Not on file  Social History Narrative   Not on file   Social Determinants of Health   Financial Resource Strain: Not on file  Food Insecurity: Not on file  Transportation Needs: Not on file  Physical Activity: Not on file  Stress: Not on file  Social Connections: Not on file    Allergies:  Allergies  Allergen Reactions   Buspar [Buspirone]     Makes the patient "flip out"   Depakote [Valproic Acid]     Causes excessive drowsiness   Clopidogrel Rash   Gabapentin Itching and Rash    Metabolic Disorder Labs: Lab Results  Component Value Date   HGBA1C 7.8 (H) 02/14/2021   MPG 177 02/14/2021   MPG 177.16 01/01/2019   No results found for: "PROLACTIN" Lab Results  Component Value Date   CHOL 161 09/15/2016   TRIG 190 (H) 09/15/2016   HDL 41 09/15/2016   CHOLHDL 3.9 09/15/2016   VLDL 38 09/15/2016   LDLCALC 82 09/15/2016   Lab Results  Component Value Date   TSH 1.952 09/15/2016    Therapeutic Level Labs: No results found for: "LITHIUM" Lab Results  Component Value  Date   VALPROATE 19 (L) 09/12/2016   No results found for: "CBMZ"  Current Medications: Current Outpatient Medications  Medication Sig Dispense Refill   amLODipine (NORVASC) 5 MG tablet Take 1 tablet (5 mg total) by mouth daily. 30 tablet 1   apixaban (ELIQUIS) 5 MG TABS tablet Take 1 tablet (5 mg total) by mouth 2 (two) times daily. 60 tablet 5   carvedilol (COREG) 25 MG tablet Take 1 tablet by mouth 2 (two) times daily.     clonazePAM (KLONOPIN) 1 MG tablet Take 1 tablet (1 mg total) by mouth as directed. Take 1 tablet  once a day as needed up to 3 times a week 12 tablet 2   empagliflozin (JARDIANCE) 25 MG TABS tablet Take 25 mg by mouth daily. 30 tablet 1   HUMALOG 100 UNIT/ML injection SMARTSIG:0-120 Unit(s) SUB-Q Daily     insulin aspart (NOVOLOG) 100 UNIT/ML injection INJECT UP TO 120 UNITS UNDER THE SKIN AS DIRECTED DAILY VIA INSULIN PUMP     ketoconazole (NIZORAL) 2 % cream Apply topically.     ketoconazole (NIZORAL) 2 % cream Apply to groin and inner thighs once a day for rash. 60 g 3   levothyroxine (SYNTHROID) 75 MCG tablet TAKE 1 TABLET BY MOUTH DAILY AT 6 AM 90 tablet 0   losartan-hydrochlorothiazide (HYZAAR) 100-25 MG tablet Take 1 tablet by mouth daily.     metoprolol succinate (TOPROL-XL) 25 MG 24 hr tablet TAKE 1 TABLET(25 MG) BY MOUTH EVERY DAY     mometasone (ELOCON) 0.1 % cream Apply to rash on arms and chest twice daily until improved. 45 g 1   nystatin cream (MYCOSTATIN) Apply topically.     ondansetron (ZOFRAN-ODT) 4 MG disintegrating tablet Take 4 mg by mouth every 8 (eight) hours as needed for nausea or vomiting.     oxyCODONE-acetaminophen (PERCOCET/ROXICET) 5-325 MG tablet Take by mouth every 4 (four) hours as needed for severe pain.     pantoprazole (PROTONIX) 40 MG tablet TAKE 1 TABLET(40 MG) BY MOUTH TWICE DAILY     pregabalin (LYRICA) 50 MG capsule Take by mouth.     rosuvastatin (CRESTOR) 20 MG tablet Take 20 mg by mouth at bedtime.     sodium hypochlorite  (DAKIN'S 1/4 STRENGTH) 0.125 % SOLN      triamcinolone cream (KENALOG) 0.1 % Apply topically 2 (two) times daily.     venlafaxine XR (EFFEXOR-XR) 150 MG 24 hr capsule Take 1 capsule (150 mg total) by mouth daily with breakfast. 90 capsule 0   zolpidem (AMBIEN) 10 MG tablet TAKE 1 TABLET(10 MG) BY MOUTH AT BEDTIME AS NEEDED FOR SLEEP 30 tablet 2   buPROPion (WELLBUTRIN XL) 300 MG 24 hr tablet TAKE 1 TABLET(300 MG) BY MOUTH DAILY WITH BREAKFAST 90 tablet 0   OLANZapine (ZYPREXA) 7.5 MG tablet Take 1 tablet (7.5 mg total) by mouth at bedtime as needed. 90 tablet 0   No current facility-administered medications for this visit.     Musculoskeletal: Strength & Muscle Tone: within normal limits Gait & Station:  walks slow , with left sided prosthetics Patient leans: N/A  Psychiatric Specialty Exam: Review of Systems  Musculoskeletal:        Rib pain, s/p fall  Psychiatric/Behavioral:  Positive for sleep disturbance. The patient is nervous/anxious.   All other systems reviewed and are negative.   Blood pressure (S) (!) 192/72, pulse 79, temperature 97.9 F (36.6 C), temperature source Skin, height '5\' 11"'$  (1.803 m), weight 283 lb 3.2 oz (128.5 kg).Body mass index is 39.5 kg/m.  General Appearance: Casual  Eye Contact:  Good  Speech:  Normal Rate  Volume:  Decreased status post tracheostomy-chronic  Mood:  Anxious  Affect:  Appropriate  Thought Process:  Goal Directed and Descriptions of Associations: Intact  Orientation:  Full (Time, Place, and Person)  Thought Content: Logical   Suicidal Thoughts:  No  Homicidal Thoughts:  No  Memory:  Immediate;   Fair Recent;   Fair Remote;   Fair  Judgement:  Fair  Insight:  Fair  Psychomotor Activity:  Normal  Concentration:  Concentration: Fair and Attention  Span: Fair  Recall:  [employer] of Knowledge: Fair  Language: Fair  Akathisia:  No  Handed:  Right  AIMS (if indicated): not done  Assets:  Communication Skills Desire for  Improvement Housing Social Support  ADL's:  Intact  Cognition: WNL  Sleep:   restless due to pain   Screenings: [facility] [facility] Office Visit from 04/05/2022 in [facility] Video Visit from 10/31/2021 in [facility] Office Visit from 08/30/2021 in [facility] Office Visit from 07/05/2021 in [facility] Office Visit from 08/03/2020 in [facility] Total Score 0 0 0 0 0      AUDIT    Flowsheet Row Admission (Discharged) from 01/22/2019 in [facility] Admission (Discharged) from 12/31/2018 in [facility] Admission (Discharged) from 09/14/2016 in [facility]  Alcohol Use Disorder Identification Test Final Score (AUDIT) 0 0 1      ECT-MADRS    Flowsheet Row Admission (Discharged) from 01/22/2019 in [facility] Total Score [place] Office Visit from 06/06/2022 in [facility] Office Visit from 04/05/2022 in [facility] Video Visit from 01/16/2022 in [facility]  Total GAD-7 Score 3 3 0      Mini-Mental    Flowsheet Row Admission (Discharged) from 01/22/2019 in [facility]  Total Score (max 30 points ) 30      PHQ2-9    [place] Office Visit from 06/06/2022 in [facility] Office Visit from 04/05/2022 in [facility] Video Visit from 01/16/2022 in [facility] Video Visit from 10/31/2021 in [facility] Office Visit from 08/30/2021 in [facility]  PHQ-2 Total Score 2 2 0 4 1  PHQ-9 Total Score 6 6 -- 4 4      [facility] Office Visit from 06/06/2022 in [facility] Office Visit from 04/05/2022 in [facility] Video Visit from 01/16/2022 in [facility] CATEGORY Low Risk Low Risk Low Risk        Assessment and Plan: [name] is a 63 year old Caucasian male, married, on disability, history of multiple medical problems, MDD, GAD was evaluated in office today.  Patient is currently in pain due to recent fall, status post rib fracture currently recovering from the same, which does have an impact on his mood.  Discussed plan as noted below.  Plan MDD in remission Effexor extended release 150 mg p.o. daily Wellbutrin XL 300 mg p.o. daily Continue olanzapine 7.5 mg p.o. nightly-currently using half tablet.  Patient reports he tried tapering it off and could not sleep without it.  Will continue the medication for now.  However once patient recovers from his rib fracture could discuss further options for sleep and taper off olanzapine.  GAD-stable Continue Effexor extended release 150 mg p.o. daily Klonopin 1 mg up to 3 times a week as needed.  Provided education about drug to drug interaction including medications like oxycodone.  Patient recently had to start taking oxycodone for his pain from rib fracture. Reviewed [place] PMP  AWARxE  Insomnia-unstable due to pain Patient will need sufficient pain management Continue Ambien 10 mg p.o. nightly  Bereavement-improving Continue grief counseling.  Weight gain likely due to medications-olanzapine-improving Patient provided education.  Long-term plan is to taper off olanzapine.  Diet management, exercise to be continued.  Patient with elevated blood pressure reading in session today, patient reports he has been noncompliant  with antihypertensive medications since the past couple of days and also he is in pain from his rib fracture.  Patient to get back on his medication, monitor blood pressure, contact primary care provider.  Patient agrees to do so.  Follow-up in clinic in 8 weeks or sooner if needed.  This note was generated in part or whole with voice recognition software. Voice recognition is usually quite accurate but there are transcription errors that can and very often do occur. I apologize for any typographical errors that were not detected and corrected.     Ursula Alert, MD 06/07/2022, 8:34 AM

## 2022-06-12 DIAGNOSIS — Z9002 Acquired absence of larynx: Secondary | ICD-10-CM | POA: Diagnosis not present

## 2022-06-12 DIAGNOSIS — Z4689 Encounter for fitting and adjustment of other specified devices: Secondary | ICD-10-CM | POA: Diagnosis not present

## 2022-06-12 DIAGNOSIS — Z963 Presence of artificial larynx: Secondary | ICD-10-CM | POA: Diagnosis not present

## 2022-06-13 DIAGNOSIS — S2241XA Multiple fractures of ribs, right side, initial encounter for closed fracture: Secondary | ICD-10-CM | POA: Diagnosis not present

## 2022-06-13 DIAGNOSIS — Y92009 Unspecified place in unspecified non-institutional (private) residence as the place of occurrence of the external cause: Secondary | ICD-10-CM | POA: Diagnosis not present

## 2022-06-13 DIAGNOSIS — R0781 Pleurodynia: Secondary | ICD-10-CM | POA: Diagnosis not present

## 2022-06-13 DIAGNOSIS — R079 Chest pain, unspecified: Secondary | ICD-10-CM | POA: Diagnosis not present

## 2022-06-13 DIAGNOSIS — W19XXXD Unspecified fall, subsequent encounter: Secondary | ICD-10-CM | POA: Diagnosis not present

## 2022-06-13 DIAGNOSIS — S2231XK Fracture of one rib, right side, subsequent encounter for fracture with nonunion: Secondary | ICD-10-CM | POA: Diagnosis not present

## 2022-06-17 ENCOUNTER — Other Ambulatory Visit: Payer: Self-pay | Admitting: Psychiatry

## 2022-06-17 DIAGNOSIS — Z634 Disappearance and death of family member: Secondary | ICD-10-CM

## 2022-06-23 DIAGNOSIS — Z8739 Personal history of other diseases of the musculoskeletal system and connective tissue: Secondary | ICD-10-CM | POA: Diagnosis not present

## 2022-06-23 DIAGNOSIS — F330 Major depressive disorder, recurrent, mild: Secondary | ICD-10-CM | POA: Diagnosis not present

## 2022-06-23 DIAGNOSIS — Z794 Long term (current) use of insulin: Secondary | ICD-10-CM | POA: Diagnosis not present

## 2022-06-23 DIAGNOSIS — N1831 Chronic kidney disease, stage 3a: Secondary | ICD-10-CM | POA: Diagnosis not present

## 2022-06-23 DIAGNOSIS — Z930 Tracheostomy status: Secondary | ICD-10-CM | POA: Diagnosis not present

## 2022-06-23 DIAGNOSIS — I129 Hypertensive chronic kidney disease with stage 1 through stage 4 chronic kidney disease, or unspecified chronic kidney disease: Secondary | ICD-10-CM | POA: Diagnosis not present

## 2022-06-23 DIAGNOSIS — Z89512 Acquired absence of left leg below knee: Secondary | ICD-10-CM | POA: Diagnosis not present

## 2022-06-23 DIAGNOSIS — J449 Chronic obstructive pulmonary disease, unspecified: Secondary | ICD-10-CM | POA: Diagnosis not present

## 2022-06-23 DIAGNOSIS — Z430 Encounter for attention to tracheostomy: Secondary | ICD-10-CM | POA: Diagnosis not present

## 2022-06-23 DIAGNOSIS — E1122 Type 2 diabetes mellitus with diabetic chronic kidney disease: Secondary | ICD-10-CM | POA: Diagnosis not present

## 2022-06-23 DIAGNOSIS — Z7984 Long term (current) use of oral hypoglycemic drugs: Secondary | ICD-10-CM | POA: Diagnosis not present

## 2022-06-27 DIAGNOSIS — G629 Polyneuropathy, unspecified: Secondary | ICD-10-CM | POA: Diagnosis not present

## 2022-06-29 DIAGNOSIS — R809 Proteinuria, unspecified: Secondary | ICD-10-CM | POA: Diagnosis not present

## 2022-06-29 DIAGNOSIS — E1029 Type 1 diabetes mellitus with other diabetic kidney complication: Secondary | ICD-10-CM | POA: Diagnosis not present

## 2022-06-29 DIAGNOSIS — I152 Hypertension secondary to endocrine disorders: Secondary | ICD-10-CM | POA: Diagnosis not present

## 2022-06-29 DIAGNOSIS — Z89512 Acquired absence of left leg below knee: Secondary | ICD-10-CM | POA: Diagnosis not present

## 2022-06-29 DIAGNOSIS — I739 Peripheral vascular disease, unspecified: Secondary | ICD-10-CM | POA: Diagnosis not present

## 2022-06-29 DIAGNOSIS — B351 Tinea unguium: Secondary | ICD-10-CM | POA: Diagnosis not present

## 2022-06-29 DIAGNOSIS — S90211A Contusion of right great toe with damage to nail, initial encounter: Secondary | ICD-10-CM | POA: Diagnosis not present

## 2022-06-29 DIAGNOSIS — E1159 Type 2 diabetes mellitus with other circulatory complications: Secondary | ICD-10-CM | POA: Diagnosis not present

## 2022-07-02 ENCOUNTER — Other Ambulatory Visit: Payer: Self-pay | Admitting: Psychiatry

## 2022-07-02 DIAGNOSIS — Z634 Disappearance and death of family member: Secondary | ICD-10-CM

## 2022-07-04 DIAGNOSIS — Z89512 Acquired absence of left leg below knee: Secondary | ICD-10-CM | POA: Diagnosis not present

## 2022-07-04 DIAGNOSIS — Y92009 Unspecified place in unspecified non-institutional (private) residence as the place of occurrence of the external cause: Secondary | ICD-10-CM | POA: Diagnosis not present

## 2022-07-04 DIAGNOSIS — S2231XK Fracture of one rib, right side, subsequent encounter for fracture with nonunion: Secondary | ICD-10-CM | POA: Diagnosis not present

## 2022-07-04 DIAGNOSIS — E1022 Type 1 diabetes mellitus with diabetic chronic kidney disease: Secondary | ICD-10-CM | POA: Diagnosis not present

## 2022-07-04 DIAGNOSIS — N1832 Chronic kidney disease, stage 3b: Secondary | ICD-10-CM | POA: Diagnosis not present

## 2022-07-04 DIAGNOSIS — Z930 Tracheostomy status: Secondary | ICD-10-CM | POA: Diagnosis not present

## 2022-07-04 DIAGNOSIS — R194 Change in bowel habit: Secondary | ICD-10-CM | POA: Diagnosis not present

## 2022-07-04 DIAGNOSIS — Z23 Encounter for immunization: Secondary | ICD-10-CM | POA: Diagnosis not present

## 2022-07-04 DIAGNOSIS — W19XXXD Unspecified fall, subsequent encounter: Secondary | ICD-10-CM | POA: Diagnosis not present

## 2022-07-17 ENCOUNTER — Telehealth: Payer: Self-pay

## 2022-07-17 NOTE — Telephone Encounter (Signed)
clonazePAM (KLONOPIN) 1 MG tablet12 tablet21/10/2024Sig - Route: Take 1 tablet (1 mg total) by mouth as directed. Take 1 tablet once a day as needed up to 3 times a week - OralSent to pharmacy as: clonazePAM (KLONOPIN) 1 MG tabletE-Prescribing Status: Receipt confirmed by pharmacy (05/17/2022  5:17 PM EST)   Patient was advised to take a lower dosage of clonazepam 2-3 times a week only last visit.  Patient with comorbid medical issues, I do not recommend taking clonazepam daily.  However please advise patient to follow-up with his therapist more regularly. And I will attach Otila Kluver to this so patient can be contacted for a sooner appointment if he needs further medication changes for his anxiety.

## 2022-07-17 NOTE — Telephone Encounter (Signed)
pt left a message that he needed a refill on the clonazepam. he states he will not have enough to get to his appt. pt states that usually you give him 60 tablets but this time you gave him 12. he states he needs 30 pills to get him to his next appt.   Pt last seen on 06-06-22 next appt  08-01-22   clonazePAM (KLONOPIN) 1 MG tablet Medication Date: 05/17/2022 Department: [facility] Ordering/Authorizing: Ursula Alert, MD   Order Providers  Prescribing Provider Encounter Provider  Ursula Alert, MD Carlynn Purl, CMA   Outpatient Medication Detail   Disp Refills Start End   clonazePAM (KLONOPIN) 1 MG tablet 12 tablet 2 05/17/2022    Sig - Route: Take 1 tablet (1 mg total) by mouth as directed. Take 1 tablet once a day as needed up to 3 times a week - Oral   Sent to pharmacy as: clonazePAM (KLONOPIN) 1 MG tablet   E-Prescribing Status: Receipt confirmed by pharmacy (05/17/2022  5:17 PM EST)

## 2022-07-19 NOTE — Telephone Encounter (Signed)
Noted  

## 2022-07-24 DIAGNOSIS — Z9002 Acquired absence of larynx: Secondary | ICD-10-CM | POA: Diagnosis not present

## 2022-07-24 DIAGNOSIS — R491 Aphonia: Secondary | ICD-10-CM | POA: Diagnosis not present

## 2022-07-31 DIAGNOSIS — R809 Proteinuria, unspecified: Secondary | ICD-10-CM | POA: Diagnosis not present

## 2022-07-31 DIAGNOSIS — E1029 Type 1 diabetes mellitus with other diabetic kidney complication: Secondary | ICD-10-CM | POA: Diagnosis not present

## 2022-08-01 ENCOUNTER — Telehealth (INDEPENDENT_AMBULATORY_CARE_PROVIDER_SITE_OTHER): Payer: HMO | Admitting: Psychiatry

## 2022-08-01 ENCOUNTER — Encounter: Payer: Self-pay | Admitting: Psychiatry

## 2022-08-01 DIAGNOSIS — F3342 Major depressive disorder, recurrent, in full remission: Secondary | ICD-10-CM | POA: Diagnosis not present

## 2022-08-01 DIAGNOSIS — F411 Generalized anxiety disorder: Secondary | ICD-10-CM | POA: Diagnosis not present

## 2022-08-01 DIAGNOSIS — Z634 Disappearance and death of family member: Secondary | ICD-10-CM | POA: Diagnosis not present

## 2022-08-01 DIAGNOSIS — T43595A Adverse effect of other antipsychotics and neuroleptics, initial encounter: Secondary | ICD-10-CM | POA: Diagnosis not present

## 2022-08-01 DIAGNOSIS — F5101 Primary insomnia: Secondary | ICD-10-CM | POA: Diagnosis not present

## 2022-08-01 DIAGNOSIS — T50905A Adverse effect of unspecified drugs, medicaments and biological substances, initial encounter: Secondary | ICD-10-CM

## 2022-08-01 DIAGNOSIS — R635 Abnormal weight gain: Secondary | ICD-10-CM

## 2022-08-01 MED ORDER — ZOLPIDEM TARTRATE 10 MG PO TABS: ORAL_TABLET | ORAL | 2 refills | Status: DC

## 2022-08-01 MED ORDER — CLONAZEPAM 1 MG PO TABS: 1.0000 mg | ORAL_TABLET | Freq: Every day | ORAL | 2 refills | Status: DC | PRN
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
## 2022-08-02 DIAGNOSIS — S31139A Puncture wound of abdominal wall without foreign body, unspecified quadrant without penetration into peritoneal cavity, initial encounter: Secondary | ICD-10-CM | POA: Diagnosis not present

## 2022-08-02 DIAGNOSIS — Z23 Encounter for immunization: Secondary | ICD-10-CM | POA: Diagnosis not present

## 2022-08-02 DIAGNOSIS — L03311 Cellulitis of abdominal wall: Secondary | ICD-10-CM | POA: Diagnosis not present

## 2022-08-09 ENCOUNTER — Other Ambulatory Visit: Payer: Self-pay | Admitting: Psychiatry

## 2022-08-09 DIAGNOSIS — Z634 Disappearance and death of family member: Secondary | ICD-10-CM

## 2022-08-10 DIAGNOSIS — E785 Hyperlipidemia, unspecified: Secondary | ICD-10-CM | POA: Diagnosis not present

## 2022-08-10 DIAGNOSIS — I152 Hypertension secondary to endocrine disorders: Secondary | ICD-10-CM | POA: Diagnosis not present

## 2022-08-10 DIAGNOSIS — E1069 Type 1 diabetes mellitus with other specified complication: Secondary | ICD-10-CM | POA: Diagnosis not present

## 2022-08-10 DIAGNOSIS — E039 Hypothyroidism, unspecified: Secondary | ICD-10-CM | POA: Diagnosis not present

## 2022-08-10 DIAGNOSIS — R809 Proteinuria, unspecified: Secondary | ICD-10-CM | POA: Diagnosis not present

## 2022-08-10 DIAGNOSIS — E1029 Type 1 diabetes mellitus with other diabetic kidney complication: Secondary | ICD-10-CM | POA: Diagnosis not present

## 2022-08-11 DIAGNOSIS — Z430 Encounter for attention to tracheostomy: Secondary | ICD-10-CM | POA: Diagnosis not present

## 2022-08-17 DIAGNOSIS — Z79899 Other long term (current) drug therapy: Secondary | ICD-10-CM | POA: Diagnosis not present

## 2022-08-17 DIAGNOSIS — J069 Acute upper respiratory infection, unspecified: Secondary | ICD-10-CM | POA: Diagnosis not present

## 2022-08-17 DIAGNOSIS — R6883 Chills (without fever): Secondary | ICD-10-CM | POA: Diagnosis not present

## 2022-08-21 DIAGNOSIS — Z89512 Acquired absence of left leg below knee: Secondary | ICD-10-CM | POA: Diagnosis not present

## 2022-08-30 DIAGNOSIS — R809 Proteinuria, unspecified: Secondary | ICD-10-CM | POA: Diagnosis not present

## 2022-08-30 DIAGNOSIS — E1029 Type 1 diabetes mellitus with other diabetic kidney complication: Secondary | ICD-10-CM | POA: Diagnosis not present

## 2022-09-05 DIAGNOSIS — E1022 Type 1 diabetes mellitus with diabetic chronic kidney disease: Secondary | ICD-10-CM | POA: Diagnosis not present

## 2022-09-05 DIAGNOSIS — Z125 Encounter for screening for malignant neoplasm of prostate: Secondary | ICD-10-CM | POA: Diagnosis not present

## 2022-09-05 DIAGNOSIS — E7800 Pure hypercholesterolemia, unspecified: Secondary | ICD-10-CM | POA: Diagnosis not present

## 2022-09-05 DIAGNOSIS — Z79899 Other long term (current) drug therapy: Secondary | ICD-10-CM | POA: Diagnosis not present

## 2022-09-05 DIAGNOSIS — N1832 Chronic kidney disease, stage 3b: Secondary | ICD-10-CM | POA: Diagnosis not present

## 2022-09-11 DIAGNOSIS — S88119D Complete traumatic amputation at level between knee and ankle, unspecified lower leg, subsequent encounter: Secondary | ICD-10-CM | POA: Diagnosis not present

## 2022-09-12 DIAGNOSIS — E1022 Type 1 diabetes mellitus with diabetic chronic kidney disease: Secondary | ICD-10-CM | POA: Diagnosis not present

## 2022-09-12 DIAGNOSIS — R972 Elevated prostate specific antigen [PSA]: Secondary | ICD-10-CM | POA: Diagnosis not present

## 2022-09-12 DIAGNOSIS — N1832 Chronic kidney disease, stage 3b: Secondary | ICD-10-CM | POA: Diagnosis not present

## 2022-09-12 DIAGNOSIS — Z930 Tracheostomy status: Secondary | ICD-10-CM | POA: Diagnosis not present

## 2022-09-12 DIAGNOSIS — Z79899 Other long term (current) drug therapy: Secondary | ICD-10-CM | POA: Diagnosis not present

## 2022-09-12 DIAGNOSIS — Z89512 Acquired absence of left leg below knee: Secondary | ICD-10-CM | POA: Diagnosis not present

## 2022-09-12 DIAGNOSIS — Z1331 Encounter for screening for depression: Secondary | ICD-10-CM | POA: Diagnosis not present

## 2022-09-12 DIAGNOSIS — Z0000 Encounter for general adult medical examination without abnormal findings: Secondary | ICD-10-CM | POA: Diagnosis not present

## 2022-09-12 DIAGNOSIS — F3342 Major depressive disorder, recurrent, in full remission: Secondary | ICD-10-CM | POA: Diagnosis not present

## 2022-09-14 DIAGNOSIS — Z430 Encounter for attention to tracheostomy: Secondary | ICD-10-CM | POA: Diagnosis not present

## 2022-09-19 DIAGNOSIS — E113293 Type 2 diabetes mellitus with mild nonproliferative diabetic retinopathy without macular edema, bilateral: Secondary | ICD-10-CM | POA: Diagnosis not present

## 2022-09-19 DIAGNOSIS — H2513 Age-related nuclear cataract, bilateral: Secondary | ICD-10-CM | POA: Diagnosis not present

## 2022-09-19 DIAGNOSIS — H43811 Vitreous degeneration, right eye: Secondary | ICD-10-CM | POA: Diagnosis not present

## 2022-09-27 ENCOUNTER — Other Ambulatory Visit: Payer: Self-pay | Admitting: Psychiatry

## 2022-09-27 DIAGNOSIS — Z634 Disappearance and death of family member: Secondary | ICD-10-CM

## 2022-09-28 ENCOUNTER — Encounter: Payer: Self-pay | Admitting: Psychiatry

## 2022-09-28 ENCOUNTER — Telehealth (INDEPENDENT_AMBULATORY_CARE_PROVIDER_SITE_OTHER): Payer: HMO | Admitting: Psychiatry

## 2022-09-28 DIAGNOSIS — F5101 Primary insomnia: Secondary | ICD-10-CM

## 2022-09-28 DIAGNOSIS — F331 Major depressive disorder, recurrent, moderate: Secondary | ICD-10-CM | POA: Diagnosis not present

## 2022-09-28 DIAGNOSIS — R635 Abnormal weight gain: Secondary | ICD-10-CM

## 2022-09-28 DIAGNOSIS — F411 Generalized anxiety disorder: Secondary | ICD-10-CM

## 2022-09-28 DIAGNOSIS — T50905A Adverse effect of unspecified drugs, medicaments and biological substances, initial encounter: Secondary | ICD-10-CM

## 2022-09-28 DIAGNOSIS — Z634 Disappearance and death of family member: Secondary | ICD-10-CM | POA: Diagnosis not present

## 2022-09-28 MED ORDER — VENLAFAXINE HCL ER 150 MG PO CP24
150.0000 mg | ORAL_CAPSULE | Freq: Every day | ORAL | 0 refills | Status: DC
Start: 2022-09-28 — End: 2022-12-19

## 2022-09-28 MED ORDER — VENLAFAXINE HCL ER 75 MG PO CP24
75.0000 mg | ORAL_CAPSULE | Freq: Every day | ORAL | 0 refills | Status: DC
Start: 2022-09-28 — End: 2022-12-19

## 2022-09-28 NOTE — Progress Notes (Signed)
Virtual Visit via Video Note  I connected with [name] on 09/28/22 at  3:30 PM EDT by a video enabled telemedicine application and verified that I am speaking with the correct person using two identifiers.  Location Provider Location : [facility] Patient Location : Home  Participants: Patient , Provider    I discussed the limitations of evaluation and management by telemedicine and the availability of in person appointments. The patient expressed understanding and agreed to proceed.   I discussed the assessment and treatment plan with the patient. The patient was provided an opportunity to ask questions and all were answered. The patient agreed with the plan and demonstrated an understanding of the instructions.   The patient was advised to call back or seek an in-person evaluation if the symptoms worsen or if the condition fails to improve as anticipated.   BH MD OP Progress Note  09/28/2022 3:58 PM [name]  MRN:  [id number]  Chief Complaint:  Chief Complaint  Patient presents with   Follow-up   Anxiety   Depression   Medication Refill   HPI: [name] is a 62 year old Caucasian male, widowed, lives in [place], has a history of MDD, GAD, primary insomnia, bereavement, history of tracheal stenosis status post multiple reintubation, respiratory failure due to opioid overdose, history of CVA with right-sided hemiparesis, diabetes mellitus, status post BKA unilateral, left, stage IIIb chronic kidney disease, hypertension, NSTEMI, hypothyroidism, chronic pain was evaluated by telemedicine today.  Patient today reports he is currently struggling with significant pain in his left lower extremity, status post BKA.  Patient reports his primary care provider tried pregabalin however he developed side effects to it.  He reports the pain does limit his ability to function and hence he has been staying home a lot.  Although he is interested in doing things he is unable to do so and  that has been making him depressed.  He hence has been spending his time sleeping a lot.  Patient reports his primary care suggested increasing the dosage of venlafaxine, he would like to try it.  He is currently not on the olanzapine.  He stopped using it due to concerns of weight gain side effects.  However he has had good response to olanzapine with regards to his depression in the past.  Patient agreeable to trial in the future if needed.  Patient denies any suicidality, homicidality or perceptual disturbances.  Patient continues to have good support system from family.  Patient reports he is currently following up with a therapist at [facility] and that has been beneficial.  Patient denies any other concerns today.  Visit Diagnosis:    ICD-10-CM   1. MDD (major depressive disorder), recurrent episode, moderate (HCC)  F33.1 venlafaxine XR (EFFEXOR XR) 75 MG 24 hr capsule    2. GAD (generalized anxiety disorder)  F41.1 venlafaxine XR (EFFEXOR XR) 75 MG 24 hr capsule    3. Primary insomnia  F51.01     4. Bereavement  Z63.4 venlafaxine XR (EFFEXOR-XR) 150 MG 24 hr capsule    5. Weight gain due to medication  R63.5    T50.905A    Psychotropic      Past Psychiatric History: I have reviewed past psychiatric history from progress note on 01/15/2019.  Past trials of fluoxetine, Rexulti, Xanax, Abilify.  Completed TMS-04/05/2020.  Patient also had TMS sessions prior to 04/05/2020.  History of ECT-did not tolerate it.  Multiple inpatient behavioral health admissions in 2020.  Possible remote  history of suicide attempts in 2014 however reports it was accidental and did not intend to kill himself.  Past Medical History:  Past Medical History:  Diagnosis Date   Depression    Diabetes (HCC)    Insulin Pump   Diabetes mellitus type I (HCC)    Diabetes mellitus without complication (HCC)    GERD (gastroesophageal reflux disease)    H/O laryngectomy    Heel bone fracture    Hyperlipidemia     Hypertension    Radicular pain of right lower extremity    Stroke (HCC)    Suicide attempt (HCC) 2014   damaged larynx - tracheostomy   Thyroid disease     Past Surgical History:  Procedure Laterality Date   COLONOSCOPY WITH PROPOFOL N/A 05/15/2018   Procedure: COLONOSCOPY WITH PROPOFOL;  Surgeon: [name], Boykin Nearing, MD;  Location: [facility] END[facility];  Service: Gastroenterology;  Laterality: N/A;   ESOPHAGOGASTRODUODENOSCOPY N/A 05/15/2018   Procedure: ESOPHAGOGASTRODUODENOSCOPY (EGD);  Surgeon: [name], Boykin Nearing, MD;  Location: [facility] ENDOS[facility];  Service: Gastroenterology;  Laterality: N/A;   FRACTURE SURGERY     Heel bone reconstruction Left    HERNIA REPAIR  02/2011   Umbilical hernia repair    LARYNGECTOMY     LOWER EXTREMITY ANGIOGRAPHY Left 01/31/2021   Procedure: LOWER EXTREMITY ANGIOGRAPHY;  Surgeon: Annice Needy, MD;  Location: [facility] INVASIVE CV LAB;  Service: Cardiovascular;  Laterality: Left;   LOWER EXTREMITY ANGIOGRAPHY Right 02/14/2021   Procedure: LOWER EXTREMITY ANGIOGRAPHY;  Surgeon: Annice Needy, MD;  Location: [facility] INVASIVE CV LAB;  Service: Cardiovascular;  Laterality: Right;   NECK SURGERY     fusion   SPINE SURGERY     TRACHEOSTOMY  2014   from SI attempt    Family Psychiatric History: I have reviewed family psychiatric history from progress note on 01/15/2019.  Family History:  Family History  Problem Relation Age of Onset   Osteoporosis Mother    Diabetes Mother    Hypertension Father    Mental illness Neg Hx     Social History: I have reviewed social history from progress note on 01/15/2019. Social History   Socioeconomic History   Marital status: Widowed    Spouse name: Toniann Fail   Number of children: Not on file   Years of education: Not on file   Highest education level: Not on file  Occupational History   Not on file  Tobacco Use   Smoking status: Former    Packs/day: 0    Types: Cigarettes    Quit date: 04/09/2013    Years since quitting: 9.4    Smokeless tobacco: Never  Vaping Use   Vaping Use: Never used  Substance and Sexual Activity   Alcohol use: Yes    Alcohol/week: 2.0 standard drinks of alcohol    Types: 2 Shots of liquor per week    Comment: rare   Drug use: No    Comment: Pt denied; UDS not available   Sexual activity: Yes    Partners: Female    Birth control/protection: Condom  Other Topics Concern   Not on file  Social History Narrative   Not on file   Social Determinants of Health   Financial Resource Strain: Not on file  Food Insecurity: Not on file  Transportation Needs: Not on file  Physical Activity: Not on file  Stress: Not on file  Social Connections: Not on file    Allergies:  Allergies  Allergen Reactions   Buspar [Buspirone]  Makes the patient "flip out"   Depakote [Valproic Acid]     Causes excessive drowsiness   Clopidogrel Rash   Gabapentin Itching and Rash    Metabolic Disorder Labs: Lab Results  Component Value Date   HGBA1C 7.8 (H) 02/14/2021   MPG 177 02/14/2021   MPG 177.16 01/01/2019   No results found for: "PROLACTIN" Lab Results  Component Value Date   CHOL 161 09/15/2016   TRIG 190 (H) 09/15/2016   HDL 41 09/15/2016   CHOLHDL 3.9 09/15/2016   VLDL 38 09/15/2016   LDLCALC 82 09/15/2016   Lab Results  Component Value Date   TSH 1.952 09/15/2016    Therapeutic Level Labs: No results found for: "LITHIUM" Lab Results  Component Value Date   VALPROATE 19 (L) 09/12/2016   No results found for: "CBMZ"  Current Medications: Current Outpatient Medications  Medication Sig Dispense Refill   venlafaxine XR (EFFEXOR XR) 75 MG 24 hr capsule Take 1 capsule (75 mg total) by mouth daily with breakfast. Take along with 150 mg daily - total of 225 mg 90 capsule 0   amLODipine (NORVASC) 5 MG tablet Take 1 tablet (5 mg total) by mouth daily. 30 tablet 1   apixaban (ELIQUIS) 5 MG TABS tablet Take 1 tablet (5 mg total) by mouth 2 (two) times daily. 60 tablet 5    buPROPion (WELLBUTRIN XL) 300 MG 24 hr tablet TAKE 1 TABLET(300 MG) BY MOUTH DAILY WITH BREAKFAST 90 tablet 0   carvedilol (COREG) 25 MG tablet Take 1 tablet by mouth 2 (two) times daily.     clonazePAM (KLONOPIN) 1 MG tablet Take 1 tablet (1 mg total) by mouth daily as needed for anxiety. Please limit use 30 tablet 2   empagliflozin (JARDIANCE) 25 MG TABS tablet Take 25 mg by mouth daily. 30 tablet 1   HUMALOG 100 UNIT/ML injection SMARTSIG:0-120 Unit(s) SUB-Q Daily     insulin aspart (NOVOLOG) 100 UNIT/ML injection INJECT UP TO 120 UNITS UNDER THE SKIN AS DIRECTED DAILY VIA INSULIN PUMP     levothyroxine (SYNTHROID) 75 MCG tablet TAKE 1 TABLET BY MOUTH DAILY AT 6 AM 90 tablet 0   losartan-hydrochlorothiazide (HYZAAR) 100-25 MG tablet Take 1 tablet by mouth daily.     metoprolol succinate (TOPROL-XL) 25 MG 24 hr tablet TAKE 1 TABLET(25 MG) BY MOUTH EVERY DAY     mometasone (ELOCON) 0.1 % cream Apply to rash on arms and chest twice daily until improved. 45 g 1   nystatin cream (MYCOSTATIN) Apply topically. (Patient not taking: Reported on 08/01/2022)     ondansetron (ZOFRAN-ODT) 4 MG disintegrating tablet Take 4 mg by mouth every 8 (eight) hours as needed for nausea or vomiting.     pantoprazole (PROTONIX) 40 MG tablet TAKE 1 TABLET(40 MG) BY MOUTH TWICE DAILY     rosuvastatin (CRESTOR) 20 MG tablet Take 20 mg by mouth at bedtime.     sodium hypochlorite (DAKIN'S 1/4 STRENGTH) 0.125 % SOLN      triamcinolone cream (KENALOG) 0.1 % Apply topically 2 (two) times daily. (Patient not taking: Reported on 08/01/2022)     venlafaxine XR (EFFEXOR-XR) 150 MG 24 hr capsule Take 1 capsule (150 mg total) by mouth daily. Take along with 75 mg daily 90 capsule 0   zolpidem (AMBIEN) 10 MG tablet TAKE 1 TABLET(10 MG) BY MOUTH AT BEDTIME AS NEEDED FOR SLEEP 30 tablet 2   No current facility-administered medications for this visit.     Musculoskeletal: Strength & Muscle  Tone:  UTA Gait & Station:   Seated Patient leans: N/A  Psychiatric Specialty Exam: Review of Systems  Unable to perform ROS: Psychiatric disorder  All other systems reviewed and are negative.   There were no vitals taken for this visit.There is no height or weight on file to calculate BMI.  General Appearance: Casual  Eye Contact:  Fair  Speech:  Normal Rate  Volume:  Normal, status post tracheostomy-chronic  Mood:  Anxious and Depressed  Affect:  Congruent  Thought Process:  Goal Directed and Descriptions of Associations: Intact  Orientation:  Full (Time, Place, and Person)  Thought Content: Logical   Suicidal Thoughts:  No  Homicidal Thoughts:  No  Memory:  Immediate;   Fair Recent;   Fair Remote;   Fair  Judgement:  Fair  Insight:  Fair  Psychomotor Activity:  Normal  Concentration:  Concentration: Fair and Attention Span: Fair  Recall:  [employer] of Knowledge: Fair  Language: Fair  Akathisia:  No  Handed:  Right  AIMS (if indicated): not done  Assets:  Communication Skills Desire for Improvement Housing Social Support  ADL's:  Intact  Cognition: WNL  Sleep:   Excessive   Screenings: AIMS    Flowsheet Row Office Visit from 04/05/2022 in [facility] Video Visit from 10/31/2021 in [facility] Office Visit from 08/30/2021 in [facility] Office Visit from 07/05/2021 in [facility] Office Visit from 08/03/2020 in [facility]  AIMS Total Score 0 0 0 0 0      AUDIT    Flowsheet Row Admission (Discharged) from 01/22/2019 in [facility] Admission (Discharged) from 12/31/2018 in [facility] BE[facility] Admission (Discharged) from 09/14/2016 in [facility]PATIENT BEHAV[facility] MEDICINE  Alcohol Use Disorder Identification Test Final Score (AUDIT) 0 0 1      ECT-MADRS     Flowsheet Row Admission (Discharged) from 01/22/2019 in [facility] BE[facility]IORAL MEDICINE  MADRS Total Score 27      GAD-7    Flowsheet Row Video Visit from 08/01/2022 in [facility] Office Visit from 06/06/2022 in [facility] Office Visit from 04/05/2022 in [facility] Video Visit from 01/16/2022 in [facility]  Total GAD-7 Score 6 3 3  0      Mini-Mental    Flowsheet Row Admission (Discharged) from 01/22/2019 in [facility]AL MEDICINE  Total Score (max 30 points ) 30      PHQ2-9    Flowsheet Row Video Visit from 09/28/2022 in [facility] Video Visit from 08/01/2022 in [facility] Office Visit from 06/06/2022 in [facility] Office Visit from 04/05/2022 in [facility] Video Visit from 01/16/2022 in [facility]  PHQ-2 Total Score 3 0 2 2 0  PHQ-9 Total Score 13 0 6 6 --      Flowsheet Row Video Visit from 09/28/2022 in [facility] Video Visit from 08/01/2022 in [facility] Office Visit from 06/06/2022 in [facility]  C-SSRS RISK CATEGORY Low Risk Low Risk Low Risk        Assessment and Plan: ERSKIN FEDDER is a 62 year old Caucasian male, married, on disability,  history of multiple medical problems, MDD, GAD was evaluated by telemedicine today.  Patient is currently struggling with lower extremity pain, status post BKA, worsening depression symptoms, will benefit from following plan.  Plan MDD-unstable Increase Effexor extended release to 225 milligram p.o. daily Wellbutrin XL 300 mg p.o.  daily Patient currently not on olanzapine, however will consider restarting olanzapine or another atypical antipsychotic like Vraylar.  GAD-unstable Increase Effexor extended release to 225 mg p.o. daily Patient to continue CBT with therapist. Klonopin 1 mg p.o. daily as needed Reviewed [place] PMP AWARxE  Insomnia-stable Patient does have Ambien 10 mg p.o. nightly available Patient currently reports sleep is excessive as noted above.  Bereavement-improving Continue psychotherapy  Weight gain likely due to medication-olanzapine-patient currently not on olanzapine.  Follow-up in clinic in 2 weeks or sooner if needed.  Crisis plan discussed with patient.  Patient to go to nearest emergency department or urgent care if in a crisis.  Collaboration of Care: Collaboration of Care: Referral or follow-up with counselor/therapist AEB patient encouraged to continue CBT.  Patient/Guardian was advised Release of Information must be obtained prior to any record release in order to collaborate their care with an outside provider. Patient/Guardian was advised if they have not already done so to contact the registration department to sign all necessary forms in order for [place] to release information regarding their care.   Consent: Patient/Guardian gives verbal consent for treatment and assignment of benefits for services provided during this visit. Patient/Guardian expressed understanding and agreed to proceed.  This note was generated in part or whole with voice recognition software. Voice recognition is usually quite accurate but there are transcription errors that can and very often do occur. I apologize for any typographical errors that were not detected and corrected.  This note was generated in part or whole with voice recognition software. Voice recognition is usually quite accurate but there are transcription errors that can and very often do occur. I apologize for any typographical errors that were not  detected and corrected.      Jomarie Longs, MD 09/28/2022, 3:58 PM

## 2022-09-29 DIAGNOSIS — R809 Proteinuria, unspecified: Secondary | ICD-10-CM | POA: Diagnosis not present

## 2022-09-29 DIAGNOSIS — E1029 Type 1 diabetes mellitus with other diabetic kidney complication: Secondary | ICD-10-CM | POA: Diagnosis not present

## 2022-10-05 ENCOUNTER — Other Ambulatory Visit: Payer: Self-pay | Admitting: Psychiatry

## 2022-10-05 DIAGNOSIS — Z634 Disappearance and death of family member: Secondary | ICD-10-CM

## 2022-10-17 DIAGNOSIS — Z448 Encounter for fitting and adjustment of other external prosthetic devices: Secondary | ICD-10-CM | POA: Diagnosis not present

## 2022-10-17 DIAGNOSIS — Z9002 Acquired absence of larynx: Secondary | ICD-10-CM | POA: Diagnosis not present

## 2022-10-17 DIAGNOSIS — Z963 Presence of artificial larynx: Secondary | ICD-10-CM | POA: Diagnosis not present

## 2022-10-19 ENCOUNTER — Telehealth: Payer: HMO | Admitting: Psychiatry

## 2022-10-20 DIAGNOSIS — R051 Acute cough: Secondary | ICD-10-CM | POA: Diagnosis not present

## 2022-10-20 DIAGNOSIS — J019 Acute sinusitis, unspecified: Secondary | ICD-10-CM | POA: Diagnosis not present

## 2022-10-20 DIAGNOSIS — Z03818 Encounter for observation for suspected exposure to other biological agents ruled out: Secondary | ICD-10-CM | POA: Diagnosis not present

## 2022-10-20 DIAGNOSIS — R0989 Other specified symptoms and signs involving the circulatory and respiratory systems: Secondary | ICD-10-CM | POA: Diagnosis not present

## 2022-10-20 DIAGNOSIS — S2231XA Fracture of one rib, right side, initial encounter for closed fracture: Secondary | ICD-10-CM | POA: Diagnosis not present

## 2022-10-20 DIAGNOSIS — B9689 Other specified bacterial agents as the cause of diseases classified elsewhere: Secondary | ICD-10-CM | POA: Diagnosis not present

## 2022-10-20 DIAGNOSIS — R062 Wheezing: Secondary | ICD-10-CM | POA: Diagnosis not present

## 2022-10-20 DIAGNOSIS — R059 Cough, unspecified: Secondary | ICD-10-CM | POA: Diagnosis not present

## 2022-10-27 ENCOUNTER — Emergency Department: Payer: HMO

## 2022-10-27 ENCOUNTER — Inpatient Hospital Stay
Admission: EM | Admit: 2022-10-27 | Discharge: 2022-10-30 | DRG: 194 | Disposition: A | Discharge status: Home / Self Care | Payer: HMO | Attending: Student | Admitting: Student

## 2022-10-27 DIAGNOSIS — Z79890 Hormone replacement therapy (postmenopausal): Secondary | ICD-10-CM | POA: Diagnosis not present

## 2022-10-27 DIAGNOSIS — Z7984 Long term (current) use of oral hypoglycemic drugs: Secondary | ICD-10-CM | POA: Diagnosis not present

## 2022-10-27 DIAGNOSIS — N179 Acute kidney failure, unspecified: Secondary | ICD-10-CM | POA: Diagnosis present

## 2022-10-27 DIAGNOSIS — R072 Precordial pain: Secondary | ICD-10-CM | POA: Diagnosis not present

## 2022-10-27 DIAGNOSIS — I10 Essential (primary) hypertension: Secondary | ICD-10-CM | POA: Diagnosis present

## 2022-10-27 DIAGNOSIS — R5383 Other fatigue: Secondary | ICD-10-CM | POA: Diagnosis not present

## 2022-10-27 DIAGNOSIS — Z87891 Personal history of nicotine dependence: Secondary | ICD-10-CM | POA: Diagnosis not present

## 2022-10-27 DIAGNOSIS — R0789 Other chest pain: Secondary | ICD-10-CM | POA: Diagnosis not present

## 2022-10-27 DIAGNOSIS — R1084 Generalized abdominal pain: Secondary | ICD-10-CM | POA: Diagnosis not present

## 2022-10-27 DIAGNOSIS — Z9641 Presence of insulin pump (external) (internal): Secondary | ICD-10-CM | POA: Diagnosis present

## 2022-10-27 DIAGNOSIS — R1011 Right upper quadrant pain: Secondary | ICD-10-CM | POA: Diagnosis not present

## 2022-10-27 DIAGNOSIS — Z8249 Family history of ischemic heart disease and other diseases of the circulatory system: Secondary | ICD-10-CM | POA: Diagnosis not present

## 2022-10-27 DIAGNOSIS — R051 Acute cough: Secondary | ICD-10-CM | POA: Diagnosis not present

## 2022-10-27 DIAGNOSIS — I82409 Acute embolism and thrombosis of unspecified deep veins of unspecified lower extremity: Secondary | ICD-10-CM | POA: Diagnosis present

## 2022-10-27 DIAGNOSIS — J180 Bronchopneumonia, unspecified organism: Secondary | ICD-10-CM | POA: Diagnosis not present

## 2022-10-27 DIAGNOSIS — Z6837 Body mass index (BMI) 37.0-37.9, adult: Secondary | ICD-10-CM

## 2022-10-27 DIAGNOSIS — E1065 Type 1 diabetes mellitus with hyperglycemia: Secondary | ICD-10-CM | POA: Diagnosis present

## 2022-10-27 DIAGNOSIS — J189 Pneumonia, unspecified organism: Secondary | ICD-10-CM | POA: Diagnosis present

## 2022-10-27 DIAGNOSIS — Z794 Long term (current) use of insulin: Secondary | ICD-10-CM

## 2022-10-27 DIAGNOSIS — Z8673 Personal history of transient ischemic attack (TIA), and cerebral infarction without residual deficits: Secondary | ICD-10-CM | POA: Diagnosis not present

## 2022-10-27 DIAGNOSIS — D72829 Elevated white blood cell count, unspecified: Secondary | ICD-10-CM | POA: Diagnosis present

## 2022-10-27 DIAGNOSIS — Z888 Allergy status to other drugs, medicaments and biological substances status: Secondary | ICD-10-CM

## 2022-10-27 DIAGNOSIS — Z930 Tracheostomy status: Secondary | ICD-10-CM

## 2022-10-27 DIAGNOSIS — R918 Other nonspecific abnormal finding of lung field: Secondary | ICD-10-CM | POA: Diagnosis not present

## 2022-10-27 DIAGNOSIS — R42 Dizziness and giddiness: Secondary | ICD-10-CM | POA: Diagnosis not present

## 2022-10-27 DIAGNOSIS — R630 Anorexia: Secondary | ICD-10-CM | POA: Diagnosis not present

## 2022-10-27 DIAGNOSIS — R109 Unspecified abdominal pain: Secondary | ICD-10-CM | POA: Diagnosis present

## 2022-10-27 DIAGNOSIS — Z7901 Long term (current) use of anticoagulants: Secondary | ICD-10-CM | POA: Diagnosis not present

## 2022-10-27 DIAGNOSIS — E109 Type 1 diabetes mellitus without complications: Secondary | ICD-10-CM | POA: Diagnosis present

## 2022-10-27 DIAGNOSIS — R112 Nausea with vomiting, unspecified: Secondary | ICD-10-CM | POA: Diagnosis not present

## 2022-10-27 DIAGNOSIS — Z89512 Acquired absence of left leg below knee: Secondary | ICD-10-CM | POA: Diagnosis not present

## 2022-10-27 DIAGNOSIS — R1310 Dysphagia, unspecified: Secondary | ICD-10-CM

## 2022-10-27 DIAGNOSIS — E785 Hyperlipidemia, unspecified: Secondary | ICD-10-CM | POA: Diagnosis present

## 2022-10-27 DIAGNOSIS — K8020 Calculus of gallbladder without cholecystitis without obstruction: Secondary | ICD-10-CM | POA: Diagnosis present

## 2022-10-27 DIAGNOSIS — Z79899 Other long term (current) drug therapy: Secondary | ICD-10-CM

## 2022-10-27 DIAGNOSIS — K219 Gastro-esophageal reflux disease without esophagitis: Secondary | ICD-10-CM | POA: Diagnosis present

## 2022-10-27 DIAGNOSIS — F332 Major depressive disorder, recurrent severe without psychotic features: Secondary | ICD-10-CM | POA: Diagnosis present

## 2022-10-27 DIAGNOSIS — Z86711 Personal history of pulmonary embolism: Secondary | ICD-10-CM | POA: Diagnosis not present

## 2022-10-27 DIAGNOSIS — R079 Chest pain, unspecified: Secondary | ICD-10-CM | POA: Diagnosis not present

## 2022-10-27 DIAGNOSIS — E039 Hypothyroidism, unspecified: Secondary | ICD-10-CM | POA: Diagnosis not present

## 2022-10-27 DIAGNOSIS — E860 Dehydration: Secondary | ICD-10-CM | POA: Diagnosis not present

## 2022-10-27 DIAGNOSIS — I2510 Atherosclerotic heart disease of native coronary artery without angina pectoris: Secondary | ICD-10-CM | POA: Diagnosis not present

## 2022-10-27 DIAGNOSIS — Z8262 Family history of osteoporosis: Secondary | ICD-10-CM

## 2022-10-27 DIAGNOSIS — Z833 Family history of diabetes mellitus: Secondary | ICD-10-CM

## 2022-10-27 LAB — URINALYSIS, ROUTINE W REFLEX MICROSCOPIC
Bacteria, UA: NONE SEEN
Bilirubin Urine: NEGATIVE
Glucose, UA: >=500 mg/dL — AB
Hgb urine dipstick: NEGATIVE
Ketones, ur: NEGATIVE mg/dL
Leukocytes,Ua: NEGATIVE
Nitrite: NEGATIVE
Protein, ur: 30 mg/dL — AB
Specific Gravity, Urine: 1.03 (ref 1.005–1.030)
pH: 6 (ref 5.0–8.0)

## 2022-10-27 LAB — LIPASE, BLOOD: Lipase: 20 U/L (ref 11–51)

## 2022-10-27 LAB — URINE DRUG SCREEN, QUALITATIVE (ARMC ONLY)
Amphetamines, Ur Screen: NOT DETECTED
Barbiturates, Ur Screen: NOT DETECTED
Benzodiazepine, Ur Scrn: NOT DETECTED
Cannabinoid 50 Ng, Ur ~~LOC~~: NOT DETECTED
Cocaine Metabolite,Ur ~~LOC~~: NOT DETECTED
MDMA (Ecstasy)Ur Screen: NOT DETECTED
Methadone Scn, Ur: NOT DETECTED
Opiate, Ur Screen: NOT DETECTED
Phencyclidine (PCP) Ur S: NOT DETECTED
Tricyclic, Ur Screen: NOT DETECTED

## 2022-10-27 LAB — BRAIN NATRIURETIC PEPTIDE: B Natriuretic Peptide: 38.5 pg/mL (ref 0.0–100.0)

## 2022-10-27 LAB — HEPATIC FUNCTION PANEL
ALT: 17 U/L (ref 0–44)
AST: 17 U/L (ref 15–41)
Albumin: 3.8 g/dL (ref 3.5–5.0)
Alkaline Phosphatase: 140 U/L — ABNORMAL HIGH (ref 38–126)
Bilirubin, Direct: <0.1 mg/dL (ref 0.0–0.2)
Total Bilirubin: 1 mg/dL (ref 0.3–1.2)
Total Protein: 7.2 g/dL (ref 6.5–8.1)

## 2022-10-27 LAB — MAGNESIUM: Magnesium: 2.5 mg/dL — ABNORMAL HIGH (ref 1.7–2.4)

## 2022-10-27 LAB — TROPONIN I (HIGH SENSITIVITY)
Troponin I (High Sensitivity): 8 ng/L (ref <18)
Troponin I (High Sensitivity): 7 ng/L (ref <18)

## 2022-10-27 LAB — LACTIC ACID, PLASMA: Lactic Acid, Venous: 1.9 mmol/L (ref 0.5–1.9)

## 2022-10-27 MED ORDER — MORPHINE SULFATE (PF) 2 MG/ML IV SOLN
2.0000 mg | INTRAVENOUS | Status: DC | PRN
  Administered 2022-10-28 – 2022-10-29 (×2): 2 mg via INTRAVENOUS
  Filled 2022-10-27 (×2): qty 1

## 2022-10-27 MED ORDER — SODIUM CHLORIDE 0.9 % IV SOLN
2.0000 g | Freq: Once | INTRAVENOUS | Status: AC
  Administered 2022-10-27: 2 g via INTRAVENOUS
  Filled 2022-10-27: qty 12.5

## 2022-10-27 MED ORDER — BUPROPION HCL ER (XL) 150 MG PO TB24
300.0000 mg | ORAL_TABLET | Freq: Every day | ORAL | Status: DC
  Administered 2022-10-28 – 2022-10-30 (×3): 300 mg via ORAL
  Filled 2022-10-27 (×3): qty 2

## 2022-10-27 MED ORDER — HYDROCODONE-ACETAMINOPHEN 5-325 MG PO TABS
1.0000 | ORAL_TABLET | ORAL | Status: DC | PRN
  Administered 2022-10-28 – 2022-10-29 (×3): 1 via ORAL
  Filled 2022-10-27 (×3): qty 1

## 2022-10-27 MED ORDER — AMLODIPINE BESYLATE 5 MG PO TABS
5.0000 mg | ORAL_TABLET | Freq: Every day | ORAL | Status: DC
  Administered 2022-10-28 – 2022-10-30 (×3): 5 mg via ORAL
  Filled 2022-10-27 (×3): qty 1

## 2022-10-27 MED ORDER — MORPHINE SULFATE (PF) 4 MG/ML IV SOLN
4.0000 mg | Freq: Once | INTRAVENOUS | Status: AC
  Administered 2022-10-27: 4 mg via INTRAVENOUS
  Filled 2022-10-27: qty 1

## 2022-10-27 MED ORDER — SODIUM CHLORIDE 0.9% FLUSH
3.0000 mL | Freq: Two times a day (BID) | INTRAVENOUS | Status: DC
  Administered 2022-10-28 – 2022-10-30 (×5): 3 mL via INTRAVENOUS

## 2022-10-27 MED ORDER — LACTATED RINGERS IV SOLN: INTRAVENOUS | Status: AC

## 2022-10-27 MED ORDER — ACETAMINOPHEN 650 MG RE SUPP: 650.0000 mg | Freq: Four times a day (QID) | RECTAL | Status: DC | PRN

## 2022-10-27 MED ORDER — MORPHINE SULFATE (PF) 4 MG/ML IV SOLN
4.0000 mg | Freq: Once | INTRAVENOUS | Status: AC
  Administered 2022-10-28: 4 mg via INTRAVENOUS
  Filled 2022-10-27: qty 1

## 2022-10-27 MED ORDER — IOHEXOL 350 MG/ML SOLN
100.0000 mL | Freq: Once | INTRAVENOUS | Status: AC | PRN
  Administered 2022-10-27: 100 mL via INTRAVENOUS

## 2022-10-27 MED ORDER — CLONAZEPAM 1 MG PO TABS: 1.0000 mg | ORAL_TABLET | Freq: Every day | ORAL | Status: DC | PRN

## 2022-10-27 MED ORDER — ONDANSETRON HCL 4 MG/2ML IJ SOLN
4.0000 mg | INTRAMUSCULAR | Status: AC
  Administered 2022-10-27: 4 mg via INTRAVENOUS
  Filled 2022-10-27: qty 2

## 2022-10-27 MED ORDER — ACETAMINOPHEN 325 MG PO TABS: 650.0000 mg | ORAL_TABLET | Freq: Four times a day (QID) | ORAL | Status: DC | PRN

## 2022-10-27 MED ORDER — APIXABAN 5 MG PO TABS
5.0000 mg | ORAL_TABLET | Freq: Two times a day (BID) | ORAL | Status: DC
  Administered 2022-10-28 – 2022-10-30 (×5): 5 mg via ORAL
  Filled 2022-10-27 (×5): qty 1

## 2022-10-27 MED ORDER — LEVOTHYROXINE SODIUM 50 MCG PO TABS
75.0000 ug | ORAL_TABLET | Freq: Every day | ORAL | Status: DC
  Administered 2022-10-28 – 2022-10-30 (×3): 75 ug via ORAL
  Filled 2022-10-27 (×3): qty 1

## 2022-10-27 MED ORDER — VANCOMYCIN HCL IN DEXTROSE 1-5 GM/200ML-% IV SOLN
1000.0000 mg | Freq: Once | INTRAVENOUS | Status: AC
  Administered 2022-10-27: 1000 mg via INTRAVENOUS
  Filled 2022-10-27: qty 200

## 2022-10-27 MED ORDER — CARVEDILOL 25 MG PO TABS
25.0000 mg | ORAL_TABLET | Freq: Two times a day (BID) | ORAL | Status: DC
  Administered 2022-10-28 – 2022-10-30 (×5): 25 mg via ORAL
  Filled 2022-10-27 (×5): qty 1

## 2022-10-27 NOTE — Consult Note (Signed)
CODE SEPSIS - PHARMACY COMMUNICATION  **Broad Spectrum Antibiotics should be administered within 1 hour of Sepsis diagnosis**  Time Code Sepsis Called/Page Received: 0807  Antibiotics Ordered: cefepime and vancomycin   Time of 1st antibiotic administration: 2046  Additional action taken by pharmacy: Messaged RN  If necessary, Name of Provider/Nurse Contacted: Gerilyn Pilgrim.     Ronnald Ramp ,PharmD Clinical Pharmacist  10/27/2022  8:53 PM

## 2022-10-27 NOTE — ED Triage Notes (Signed)
Pt sts that he has been having chest pain and abd pain. Pt sts that he also has been having a lot of chest congestion. Pt has a trach.

## 2022-10-27 NOTE — Progress Notes (Signed)
RT to patient bedside for Laryngectomy care and assessment. Patient has speaking valve on. Laryngectomy site appears clean and dry. No issues to report. Patient declined TC for humidity at this time. SAT 92% on room air, no distress noted.  Emergency trach box at bedside. Family member and MD at bedside.

## 2022-10-27 NOTE — ED Notes (Signed)
First Nurse note:  Patient was brought over from [facility] with c/o congestion/Tachypnea. Labs were drawn that showed an elevated WBC count with left shift. EKG done at [facility].

## 2022-10-27 NOTE — ED Notes (Signed)
Pt to CT

## 2022-10-27 NOTE — Progress Notes (Signed)
Elink monitoring for the code sepsis protocol.  

## 2022-10-27 NOTE — Consult Note (Signed)
PHARMACY -  BRIEF ANTIBIOTIC NOTE   Pharmacy has received consult(s) for sepsis from an ED provider.  The patient's profile has been reviewed for ht/wt/allergies/indication/available labs.    One time order(s) placed for cefepime and vancomycin  Further antibiotics/pharmacy consults should be ordered by admitting physician if indicated.                       Thank you, Ronnald Ramp 10/27/2022  8:35 PM

## 2022-10-27 NOTE — ED Notes (Signed)
ED Provider at bedside. 

## 2022-10-27 NOTE — H&P (Addendum)
History and Physical    Patient: [name] UJW:[id number] DOB: Mar 24, 1961 DOA: 10/27/2022 DOS: the patient was seen and examined on 10/28/2022 PCP: Kandyce Rud, MD  Patient coming from: Home  Chief Complaint:  Chief Complaint  Patient presents with   Chest Pain    HPI: [name] is a 62 y.o. male with medical history significant for diabetes mellitus type 1, hyperlipidemia, hypertension, stroke, suicidal attempt. Abdominal pain/ chest pain midsternal for the past few weeks, sent over from  [place] [facility] for congestion and tachypnea white count.  She reports night sweats. Chills cough.  Per report his symptoms have been going on for about a week chest pain has been going on off and on for about a month.  Patient is compliant with his medication and takes Eliquis twice daily.  Troponin in the emergency room within normal limits.  EKG is sinus rhythm with heart rate of 70 and no  ST-T wave changes CTA chest done in the emergency room shows for lobar pneumonia and cholelithiasis without cholecystitis.  She met sepsis criteria and was given vancomycin and cefepime.  Review of Systems: Review of Systems  Gastrointestinal:  Positive for abdominal pain.   Past Medical History:  Diagnosis Date   Depression    Diabetes (HCC)    Insulin Pump   Diabetes mellitus type I (HCC)    Diabetes mellitus without complication (HCC)    GERD (gastroesophageal reflux disease)    H/O laryngectomy    Heel bone fracture    Hyperlipidemia    Hypertension    Radicular pain of right lower extremity    Stroke (HCC)    Suicide attempt (HCC) 2014   damaged larynx - tracheostomy   Thyroid disease    Past Surgical History:  Procedure Laterality Date   COLONOSCOPY WITH PROPOFOL N/A 05/15/2018   Procedure: COLONOSCOPY WITH PROPOFOL;  Surgeon: [name], Boykin Nearing, MD;  Location: [facility] ENDOSCOPY;  Service: Gastroenterology;  Laterality: N/A;   ESOPHAGOGASTRODUODENOSCOPY N/A 05/15/2018   Procedure:  ESOPHAGOGASTRODUODENOSCOPY (EGD);  Surgeon: [name], Boykin Nearing, MD;  Location: [facility] ENDOSCOPY;  Service: Gastroenterology;  Laterality: N/A;   FRACTURE SURGERY     Heel bone reconstruction Left    HERNIA REPAIR  02/2011   Umbilical hernia repair    LARYNGECTOMY     LOWER EXTREMITY ANGIOGRAPHY Left 01/31/2021   Procedure: LOWER EXTREMITY ANGIOGRAPHY;  Surgeon: Annice Needy, MD;  Location: [facility];  Service: Cardiovascular;  Laterality: Left;   LOWER EXTREMITY ANGIOGRAPHY Right 02/14/2021   Procedure: LOWER EXTREMITY ANGIOGRAPHY;  Surgeon: Annice Needy, MD;  Location: [facility];  Service: Cardiovascular;  Laterality: Right;   NECK SURGERY     fusion   SPINE SURGERY     TRACHEOSTOMY  2014   from SI attempt   Social History:  reports that he quit smoking about 9 years ago. His smoking use included cigarettes. He has never used smokeless tobacco. He reports current alcohol use of about 2.0 standard drinks of alcohol per week. He reports that he does not use drugs.  Allergies  Allergen Reactions   Buspar [Buspirone]     Makes the patient "flip out"   Depakote [Valproic Acid]     Causes excessive drowsiness   Pregabalin Other (See Comments)    Gum Bleeding   Clopidogrel Rash   Gabapentin Itching and Rash    Family History  Problem Relation Age of Onset   Osteoporosis Mother    Diabetes Mother  Hypertension Father    Mental illness Neg Hx     Prior to Admission medications   Medication Sig Start Date End Date Taking? Authorizing Provider  amLODipine (NORVASC) 5 MG tablet Take 1 tablet (5 mg total) by mouth daily. 01/29/19   Clapacs, Jackquline Denmark, MD  apixaban (ELIQUIS) 5 MG TABS tablet Take 1 tablet (5 mg total) by mouth 2 (two) times daily. 02/15/21   Stegmayer, Cala Bradford A, PA-C  buPROPion (WELLBUTRIN XL) 300 MG 24 hr tablet TAKE 1 TABLET(300 MG) BY MOUTH DAILY WITH BREAKFAST 09/27/22   Jomarie Longs, MD  carvedilol (COREG) 25 MG tablet Take 1 tablet by mouth 2  (two) times daily. 06/09/21   [provider]  clonazePAM (KLONOPIN) 1 MG tablet Take 1 tablet (1 mg total) by mouth daily as needed for anxiety. Please limit use 08/01/22   Jomarie Longs, MD  empagliflozin (JARDIANCE) 25 MG TABS tablet Take 25 mg by mouth daily. 01/07/19   Clapacs, Jackquline Denmark, MD  HUMALOG 100 UNIT/ML injection SMARTSIG:0-120 Unit(s) SUB-Q Daily 03/27/22   [provider]  insulin aspart (NOVOLOG) 100 UNIT/ML injection INJECT UP TO 120 UNITS UNDER THE SKIN AS DIRECTED DAILY VIA INSULIN PUMP 02/07/19   [provider]  levothyroxine (SYNTHROID) 75 MCG tablet TAKE 1 TABLET BY MOUTH DAILY AT 6 AM 04/25/19   Clapacs, Jackquline Denmark, MD  losartan-hydrochlorothiazide (HYZAAR) 100-25 MG tablet Take 1 tablet by mouth daily. 01/16/22   [provider]  metoprolol succinate (TOPROL-XL) 25 MG 24 hr tablet TAKE 1 TABLET(25 MG) BY MOUTH EVERY DAY 10/27/21   [provider]  mometasone (ELOCON) 0.1 % cream Apply to rash on arms and chest twice daily until improved. 12/26/21   Willeen Niece, MD  ondansetron (ZOFRAN-ODT) 4 MG disintegrating tablet Take 4 mg by mouth every 8 (eight) hours as needed for nausea or vomiting.    [provider]  pantoprazole (PROTONIX) 40 MG tablet TAKE 1 TABLET(40 MG) BY MOUTH TWICE DAILY 04/29/19   [provider]  rosuvastatin (CRESTOR) 20 MG tablet Take 20 mg by mouth at bedtime. 01/03/22   [provider]  sodium hypochlorite (DAKIN'S 1/4 STRENGTH) 0.125 % SOLN  06/02/21   [provider]  venlafaxine XR (EFFEXOR XR) 75 MG 24 hr capsule Take 1 capsule (75 mg total) by mouth daily with breakfast. Take along with 150 mg daily - total of 225 mg 09/28/22   Jomarie Longs, MD  venlafaxine XR (EFFEXOR-XR) 150 MG 24 hr capsule Take 1 capsule (150 mg total) by mouth daily. Take along with 75 mg daily 09/28/22 12/27/22  Jomarie Longs, MD  zolpidem (AMBIEN) 10 MG tablet TAKE 1 TABLET(10 MG) BY MOUTH AT BEDTIME AS  NEEDED FOR SLEEP 08/15/22   Jomarie Longs, MD   Vitals:   10/27/22 1800 10/27/22 1933 10/27/22 2000 10/27/22 2200  BP: (!) 155/76 (!) 158/92 (!) 150/83 (!) 156/87  Pulse: 66 66 64 70  Resp:    (!) 22  Temp:  98 F (36.7 C)    TempSrc:  Oral    SpO2:    95%  Weight:      Height:       Physical Exam Vitals and nursing note reviewed.  Constitutional:      General: He is not in acute distress. HENT:     Head: Normocephalic and atraumatic.     Right Ear: Hearing normal.     Left Ear: Hearing normal.     Nose: Nose normal. No nasal  deformity.     Mouth/Throat:     Lips: Pink.     Tongue: No lesions.     Pharynx: Oropharynx is clear.  Eyes:     General: Lids are normal.     Extraocular Movements: Extraocular movements intact.  Cardiovascular:     Rate and Rhythm: Normal rate and regular rhythm.     Heart sounds: Normal heart sounds.  Pulmonary:     Effort: Pulmonary effort is normal.     Breath sounds: Normal breath sounds.  Abdominal:     General: Bowel sounds are normal. There is no distension.     Palpations: Abdomen is soft. There is no mass.     Tenderness: There is no abdominal tenderness. There is no guarding.  Musculoskeletal:     Right lower leg: No edema.     Left lower leg: No edema.  Skin:    General: Skin is warm.  Neurological:     General: No focal deficit present.     Mental Status: He is alert and oriented to person, place, and time.     Cranial Nerves: Cranial nerves 2-12 are intact.  Psychiatric:        Attention and Perception: Attention normal.        Mood and Affect: Mood normal.        Speech: Speech normal.        Behavior: Behavior normal. Behavior is cooperative.     Unresulted Labs (From admission, onward)     Start     Ordered   10/28/22 0500  Comprehensive metabolic panel  Tomorrow morning,   R        10/27/22 2323   10/28/22 0500  CBC  Tomorrow morning,   R        10/27/22 2323   10/27/22 2322  CBC  (heparin)  Once,   R        Comments: Baseline for heparin therapy IF NOT ALREADY DRAWN.  Notify MD if PLT < 100 K.    10/27/22 2323   10/27/22 2322  Creatinine, serum  (heparin)  Once,   R       Comments: Baseline for heparin therapy IF NOT ALREADY DRAWN.    10/27/22 2323   10/27/22 2322  HIV Antibody (routine testing w rflx)  (HIV Antibody (Routine testing w reflex) panel)  Once,   R        10/27/22 2323   10/27/22 1839  Blood culture (routine x 2)  BLOOD CULTURE X 2,   STAT      10/27/22 1838             Labs on Admission: I have personally reviewed following labs and imaging studies  CBC: No results for input(s): "WBC", "NEUTROABS", "HGB", "HCT", "MCV", "PLT" in the last 168 hours. Basic Metabolic Panel: Recent Labs  Lab 10/27/22 2051  MG 2.5*   GFR: CrCl cannot be calculated (Patient's most recent lab result is older than the maximum 21 days allowed.). Liver Function Tests: Recent Labs  Lab 10/27/22 1905  AST 17  ALT 17  ALKPHOS 140*  BILITOT 1.0  PROT 7.2  ALBUMIN 3.8   Recent Labs  Lab 10/27/22 1905  LIPASE 20   No results for input(s): "AMMONIA" in the last 168 hours. Coagulation Profile: No results for input(s): "INR", "PROTIME" in the last 168 hours. Cardiac Enzymes: No results for input(s): "CKTOTAL", "CKMB", "CKMBINDEX", "TROPONINI" in the last 168 hours. BNP (last 3 results) No results  for input(s): "PROBNP" in the last 8760 hours. HbA1C: No results for input(s): "HGBA1C" in the last 72 hours. CBG: No results for input(s): "GLUCAP" in the last 168 hours. Lipid Profile: No results for input(s): "CHOL", "HDL", "LDLCALC", "TRIG", "CHOLHDL", "LDLDIRECT" in the last 72 hours. Thyroid Function Tests: No results for input(s): "TSH", "T4TOTAL", "FREET4", "T3FREE", "THYROIDAB" in the last 72 hours. Anemia Panel: No results for input(s): "VITAMINB12", "FOLATE", "FERRITIN", "TIBC", "IRON", "RETICCTPCT" in the last 72 hours. Urine analysis:    Component Value Date/Time    COLORURINE YELLOW (A) 10/27/2022 1905   APPEARANCEUR HAZY (A) 10/27/2022 1905   APPEARANCEUR Clear 02/19/2014 1439   LABSPEC 1.030 10/27/2022 1905   LABSPEC 1.010 02/19/2014 1439   PHURINE 6.0 10/27/2022 1905   GLUCOSEU >=500 (A) 10/27/2022 1905   GLUCOSEU >=500 02/19/2014 1439   HGBUR NEGATIVE 10/27/2022 1905   BILIRUBINUR NEGATIVE 10/27/2022 1905   BILIRUBINUR Negative 02/19/2014 1439   KETONESUR NEGATIVE 10/27/2022 1905   PROTEINUR 30 (A) 10/27/2022 1905   NITRITE NEGATIVE 10/27/2022 1905   LEUKOCYTESUR NEGATIVE 10/27/2022 1905   LEUKOCYTESUR Negative 02/19/2014 1439    Radiological Exams on Admission: US ABDOMEN LIMITED RUQ (LIVER/GB)  Result Date: 10/27/2022 CLINICAL DATA:  Abdomen pain EXAM: ULTRASOUND ABDOMEN LIMITED RIGHT UPPER QUADRANT COMPARISON:  None CT 10/27/2022 FINDINGS: Gallbladder: No gallstones or wall thickening visualized. No sonographic Murphy sign noted by sonographer. Common bile duct: Diameter: 5.2 mm Liver: Slightly limited assessment for focal parenchymal abnormality. Portal vein is patent on color Doppler imaging with normal direction of blood flow towards the liver. IMPRESSION: Negative right upper quadrant ultrasound, small stones seen on CT are not well seen sonographically Electronically Signed   By: Jasmine Pang M.D.   On: 10/27/2022 21:26   CT Angio Chest PE W and/or Wo Contrast  Result Date: 10/27/2022 CLINICAL DATA:  Midsternal chest pain, right upper quadrant pain, elevated white blood cell count EXAM: CT ANGIOGRAPHY CHEST CT ABDOMEN AND PELVIS WITH CONTRAST TECHNIQUE: Multidetector CT imaging of the chest was performed using the standard protocol during bolus administration of intravenous contrast. Multiplanar CT image reconstructions and MIPs were obtained to evaluate the vascular anatomy. Multidetector CT imaging of the abdomen and pelvis was performed using the standard protocol during bolus administration of intravenous contrast. RADIATION DOSE  REDUCTION: This exam was performed according to the departmental dose-optimization program which includes automated exposure control, adjustment of the mA and/or kV according to patient size and/or use of iterative reconstruction technique. CONTRAST:  OMNIPAQUE IOHEXOL 350 MG/ML SOLN COMPARISON:  10/27/2022, 06/17/2011 FINDINGS: CTA CHEST FINDINGS Cardiovascular: This is a technically adequate evaluation of the pulmonary vasculature. No filling defects or pulmonary emboli. The heart is unremarkable without pericardial effusion. Extensive atherosclerosis of the coronary vasculature. No evidence of thoracic aortic aneurysm or dissection. Mediastinum/Nodes: No pathologic adenopathy. Tracheostomy. Otherwise the trachea is unremarkable. Segmental air esophagram is nonspecific. No esophageal wall thickening. Lungs/Pleura: Mild emphysema. Scattered tree in bud nodular opacities are seen within the bilateral upper lobes, left greater than right. There is mild bilateral bronchial wall thickening. No effusion or pneumothorax. Central airways are patent. Musculoskeletal: No acute or destructive bony abnormalities. Prior healed right posterolateral ninth through eleventh rib fractures. Reconstructed images demonstrate no additional findings. Review of the MIP images confirms the above findings. CT ABDOMEN and PELVIS FINDINGS Hepatobiliary: Small calcified gallstone without cholecystitis. The liver is unremarkable. No biliary duct dilation. Pancreas: Unremarkable. No pancreatic ductal dilatation or surrounding inflammatory changes. Spleen: Normal in size without focal abnormality.  Adrenals/Urinary Tract: Adrenal glands are unremarkable. Kidneys are normal, without renal calculi, focal lesion, or hydronephrosis. Bladder is unremarkable. Stomach/Bowel: No bowel obstruction or ileus. Normal appendix right lower quadrant. No bowel wall thickening or inflammatory change. Vascular/Lymphatic: Aortic atherosclerosis. No enlarged  abdominal or pelvic lymph nodes. Reproductive: Prostate is unremarkable. Other: No free fluid or free intraperitoneal gas. Small fat containing umbilical hernia. No bowel herniation. Musculoskeletal: No acute or destructive bony abnormalities. Reconstructed images demonstrate no additional findings. Review of the MIP images confirms the above findings. IMPRESSION: 1. No evidence of pulmonary embolus. 2. Patchy tree in bud ground-glass airspace disease within the bilateral upper lobes, as well as bilateral bronchial wall thickening, consistent with bronchopneumonia. 3. Cholelithiasis without cholecystitis. 4. Otherwise no acute intra-abdominal or intrapelvic process. 5. Fat containing umbilical hernia. 6. Aortic Atherosclerosis (ICD10-I70.0). Coronary artery atherosclerosis. Electronically Signed   By: Sharlet Salina M.D.   On: 10/27/2022 19:40   CT ABDOMEN PELVIS W CONTRAST  Result Date: 10/27/2022 CLINICAL DATA:  Midsternal chest pain, right upper quadrant pain, elevated white blood cell count EXAM: CT ANGIOGRAPHY CHEST CT ABDOMEN AND PELVIS WITH CONTRAST TECHNIQUE: Multidetector CT imaging of the chest was performed using the standard protocol during bolus administration of intravenous contrast. Multiplanar CT image reconstructions and MIPs were obtained to evaluate the vascular anatomy. Multidetector CT imaging of the abdomen and pelvis was performed using the standard protocol during bolus administration of intravenous contrast. RADIATION DOSE REDUCTION: This exam was performed according to the departmental dose-optimization program which includes automated exposure control, adjustment of the mA and/or kV according to patient size and/or use of iterative reconstruction technique. CONTRAST:  OMNIPAQUE IOHEXOL 350 MG/ML SOLN COMPARISON:  10/27/2022, 06/17/2011 FINDINGS: CTA CHEST FINDINGS Cardiovascular: This is a technically adequate evaluation of the pulmonary vasculature. No filling defects or  pulmonary emboli. The heart is unremarkable without pericardial effusion. Extensive atherosclerosis of the coronary vasculature. No evidence of thoracic aortic aneurysm or dissection. Mediastinum/Nodes: No pathologic adenopathy. Tracheostomy. Otherwise the trachea is unremarkable. Segmental air esophagram is nonspecific. No esophageal wall thickening. Lungs/Pleura: Mild emphysema. Scattered tree in bud nodular opacities are seen within the bilateral upper lobes, left greater than right. There is mild bilateral bronchial wall thickening. No effusion or pneumothorax. Central airways are patent. Musculoskeletal: No acute or destructive bony abnormalities. Prior healed right posterolateral ninth through eleventh rib fractures. Reconstructed images demonstrate no additional findings. Review of the MIP images confirms the above findings. CT ABDOMEN and PELVIS FINDINGS Hepatobiliary: Small calcified gallstone without cholecystitis. The liver is unremarkable. No biliary duct dilation. Pancreas: Unremarkable. No pancreatic ductal dilatation or surrounding inflammatory changes. Spleen: Normal in size without focal abnormality. Adrenals/Urinary Tract: Adrenal glands are unremarkable. Kidneys are normal, without renal calculi, focal lesion, or hydronephrosis. Bladder is unremarkable. Stomach/Bowel: No bowel obstruction or ileus. Normal appendix right lower quadrant. No bowel wall thickening or inflammatory change. Vascular/Lymphatic: Aortic atherosclerosis. No enlarged abdominal or pelvic lymph nodes. Reproductive: Prostate is unremarkable. Other: No free fluid or free intraperitoneal gas. Small fat containing umbilical hernia. No bowel herniation. Musculoskeletal: No acute or destructive bony abnormalities. Reconstructed images demonstrate no additional findings. Review of the MIP images confirms the above findings. IMPRESSION: 1. No evidence of pulmonary embolus. 2. Patchy tree in bud ground-glass airspace disease within the  bilateral upper lobes, as well as bilateral bronchial wall thickening, consistent with bronchopneumonia. 3. Cholelithiasis without cholecystitis. 4. Otherwise no acute intra-abdominal or intrapelvic process. 5. Fat containing umbilical hernia. 6. Aortic Atherosclerosis (ICD10-I70.0). Coronary artery atherosclerosis.  Electronically Signed   By: Sharlet Salina M.D.   On: 10/27/2022 19:40   DG Chest 2 View  Result Date: 10/27/2022 CLINICAL DATA:  Chest pain EXAM: CHEST - 2 VIEW COMPARISON:  12/31/2018 x-ray FINDINGS: No consolidation, pneumothorax or effusion. No edema. Normal cardiopericardial silhouette. IMPRESSION: No acute cardiopulmonary disease. Electronically Signed   By: Karen Kays M.D.   On: 10/27/2022 17:12     Data Reviewed: Relevant notes from primary care and specialist visits, past discharge summaries as available in EHR, including Care Everywhere. Prior diagnostic testing as pertinent to current admission diagnoses Updated medications and problem lists for reconciliation ED course, including vitals, labs, imaging, treatment and response to treatment Triage notes, nursing and pharmacy notes and ED provider's notes Notable results as noted in HPI Assessment and Plan: * Abdominal pain RUQ is negative for murphy and or stones.  Pain control may need outpatient gi eval if abd pain is persistent  and may be PUD related.   Pneumonia Cont pt on sepsis based abx protocol per pharmacy. Prn antitussives.     Dysphagia Suspect patient may have dysphagia from chronic scarring reflux or peptic ulcer disease.  Continue with PPI therapy.    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   Tracheostomy in place [facility]) Trach in place and trach care ordered.  Type 1 diabetes mellitus (HCC) Cont with carb consistent diet and Glycemic protocol.    GERD (gastroesophageal reflux  disease) Continue with PPI therapy.  HTN (hypertension) Patient continued on amlodipine and Coreg and metoprolol.  ACE inhibitor and diuretic therapy held secondary to abnormal kidney function.  Acquired hypothyroidism Continue patient on levothyroxine 75 mcg.  Major depressive disorder, recurrent, severe without psychotic features (HCC) Patient continued on bupropion and venlafaxine.   DVT prophylaxis:  Eliquis  Consults:  None  Advance Care Planning:    Code Status: Full Code   Family Communication:  None  Disposition Plan:  Back to previous home environment  Severity of Illness: The appropriate patient status for this patient is INPATIENT. Inpatient status is judged to be reasonable and necessary in order to provide the required intensity of service to ensure the patient's safety. The patient's presenting symptoms, physical exam findings, and initial radiographic and laboratory data in the context of their chronic comorbidities is felt to place them at high risk for further clinical deterioration. Furthermore, it is not anticipated that the patient will be medically stable for discharge from the hospital within 2 midnights of admission.   * I certify that at the point of admission it is my clinical judgment that the patient will require inpatient hospital care spanning beyond 2 midnights from the point of admission due to high intensity of service, high risk for further deterioration and high frequency of surveillance required.*  Author: Gertha Calkin, MD 10/28/2022 12:40 AM  For on call review [email or web address].

## 2022-10-27 NOTE — Hospital Course (Signed)
Hcap. Cough/ weakness/ feverish x 1 month.  Sepsis

## 2022-10-27 NOTE — ED Provider Notes (Signed)
[facility] Provider Note    Event Date/Time   First MD Initiated Contact with Patient 10/27/22 1824     (approximate)   History   Chest Pain   HPI  [name] is a 62 y.o. male history of laryngectomy, PE, multiple previous hospitalizations, suicidal ideations, etc.  Patient reports he had about 1 month now of intermittent chills and fatigue.  He notices that he will have night sweats.  He saw his PCP in the last month and inform them but he said that he does not feel that he had really made a recommendation for further follow-up.  He went to urgent care today because at times when this happens he will feel like he is sweaty, he is a little nauseated.   He also has noticed that he is having at times an achiness below his breastbone he says it is very mild reports to be just along the left side of his sternum.  He reports that also has been off and on for about a month  On further discussion with him it seems all the symptoms he described having have been present for about 1 month's time.  His blood sugars will vary, but he detailed in managing it  He thought he might have been developing a sinus infection about a week ago.  Took antibiotic for that.  Does not feel like anything changed    He went to urgent care today, reports they recommended he come here  Urgent care note is not very specific but just states "referring you to the emergency department for further evaluation of your symptoms and condition.  Your lab results show an elevated white blood cell count with a left shift.  This indicates a persistent infection despite outpatient therapy"  He is compliant with his blood thinner and takes Eliquis twice a day  Physical Exam   Triage Vital Signs: ED Triage Vitals  Enc Vitals Group     BP 10/27/22 1654 (!) 140/88     Pulse Rate 10/27/22 1654 68     Resp 10/27/22 1654 17     Temp 10/27/22 1654 98.6 F (37 C)     Temp Source 10/27/22 1654 Oral      SpO2 10/27/22 1654 97 %     Weight 10/27/22 1648 265 lb (120.2 kg)     Height 10/27/22 1648 6' (1.829 m)     Head Circumference --      Peak Flow --      Pain Score 10/27/22 1648 4     Pain Loc --      Pain Edu? --      Excl. in GC? --     Most recent vital signs: Vitals:   10/27/22 1800 10/27/22 1933  BP: (!) 155/76 (!) 158/92  Pulse: 66 66  Resp:    Temp:  98 F (36.7 C)  SpO2:       General: Awake, no distress.  Pleasant.  In no distress. Laryngectomy site clean dry intact.  Speaks easily with a speaking valve CV:  Good peripheral perfusion.  Normal tones and rate Resp:  Normal effort.  Clear bilateral.  No crackles rales or rhonchi. Abd:  No distention.  Protuberant but soft.  Soft nontender nondistended throughout.  Patient reports that time she will get a bit of an achy discomfort he points towards his mid to right upper abdomen that goes off-and-on but presently not active.  Negative Murphy Other:  Left  lower extremity above-the-knee amputation.  No noted rashes or open lesions on examination of skin.   ED Results / Procedures / Treatments   Labs (all labs ordered are listed, but only abnormal results are displayed) Labs Reviewed  URINALYSIS, ROUTINE W REFLEX MICROSCOPIC - Abnormal; Notable for the following components:      Result Value   Color, Urine YELLOW (*)    APPearance HAZY (*)    Glucose, UA >=500 (*)    Protein, ur 30 (*)    All other components within normal limits  HEPATIC FUNCTION PANEL - Abnormal; Notable for the following components:   Alkaline Phosphatase 140 (*)    All other components within normal limits  CULTURE, BLOOD (ROUTINE X 2)  CULTURE, BLOOD (ROUTINE X 2)  LACTIC ACID, PLASMA  BRAIN NATRIURETIC PEPTIDE  LIPASE, BLOOD  URINE DRUG SCREEN, QUALITATIVE ([facility] ONLY)  TROPONIN I (HIGH SENSITIVITY)  TROPONIN I (HIGH SENSITIVITY)     EKG  Interpreted by me at 1650 heart rate 70 QRS 80 QTc 420 Normal sinus rhythm no evidence  of acute ischemia   RADIOLOGY Chest x-ray interpreted by me as negative for acute finding   CT Angio Chest PE W and/or Wo Contrast  Result Date: 10/27/2022 CLINICAL DATA:  Midsternal chest pain, right upper quadrant pain, elevated white blood cell count EXAM: CT ANGIOGRAPHY CHEST CT ABDOMEN AND PELVIS WITH CONTRAST TECHNIQUE: Multidetector CT imaging of the chest was performed using the standard protocol during bolus administration of intravenous contrast. Multiplanar CT image reconstructions and MIPs were obtained to evaluate the vascular anatomy. Multidetector CT imaging of the abdomen and pelvis was performed using the standard protocol during bolus administration of intravenous contrast. RADIATION DOSE REDUCTION: This exam was performed according to the departmental dose-optimization program which includes automated exposure control, adjustment of the mA and/or kV according to patient size and/or use of iterative reconstruction technique. CONTRAST:  OMNIPAQUE IOHEXOL 350 MG/ML SOLN COMPARISON:  10/27/2022, 06/17/2011 FINDINGS: CTA CHEST FINDINGS Cardiovascular: This is a technically adequate evaluation of the pulmonary vasculature. No filling defects or pulmonary emboli. The heart is unremarkable without pericardial effusion. Extensive atherosclerosis of the coronary vasculature. No evidence of thoracic aortic aneurysm or dissection. Mediastinum/Nodes: No pathologic adenopathy. Tracheostomy. Otherwise the trachea is unremarkable. Segmental air esophagram is nonspecific. No esophageal wall thickening. Lungs/Pleura: Mild emphysema. Scattered tree in bud nodular opacities are seen within the bilateral upper lobes, left greater than right. There is mild bilateral bronchial wall thickening. No effusion or pneumothorax. Central airways are patent. Musculoskeletal: No acute or destructive bony abnormalities. Prior healed right posterolateral ninth through eleventh rib fractures. Reconstructed images  demonstrate no additional findings. Review of the MIP images confirms the above findings. CT ABDOMEN and PELVIS FINDINGS Hepatobiliary: Small calcified gallstone without cholecystitis. The liver is unremarkable. No biliary duct dilation. Pancreas: Unremarkable. No pancreatic ductal dilatation or surrounding inflammatory changes. Spleen: Normal in size without focal abnormality. Adrenals/Urinary Tract: Adrenal glands are unremarkable. Kidneys are normal, without renal calculi, focal lesion, or hydronephrosis. Bladder is unremarkable. Stomach/Bowel: No bowel obstruction or ileus. Normal appendix right lower quadrant. No bowel wall thickening or inflammatory change. Vascular/Lymphatic: Aortic atherosclerosis. No enlarged abdominal or pelvic lymph nodes. Reproductive: Prostate is unremarkable. Other: No free fluid or free intraperitoneal gas. Small fat containing umbilical hernia. No bowel herniation. Musculoskeletal: No acute or destructive bony abnormalities. Reconstructed images demonstrate no additional findings. Review of the MIP images confirms the above findings. IMPRESSION: 1. No evidence of pulmonary embolus. 2. Patchy tree  in bud ground-glass airspace disease within the bilateral upper lobes, as well as bilateral bronchial wall thickening, consistent with bronchopneumonia. 3. Cholelithiasis without cholecystitis. 4. Otherwise no acute intra-abdominal or intrapelvic process. 5. Fat containing umbilical hernia. 6. Aortic Atherosclerosis (ICD10-I70.0). Coronary artery atherosclerosis. Electronically Signed   By: Sharlet Salina M.D.   On: 10/27/2022 19:40   CT ABDOMEN PELVIS W CONTRAST  Result Date: 10/27/2022 CLINICAL DATA:  Midsternal chest pain, right upper quadrant pain, elevated white blood cell count EXAM: CT ANGIOGRAPHY CHEST CT ABDOMEN AND PELVIS WITH CONTRAST TECHNIQUE: Multidetector CT imaging of the chest was performed using the standard protocol during bolus administration of intravenous contrast.  Multiplanar CT image reconstructions and MIPs were obtained to evaluate the vascular anatomy. Multidetector CT imaging of the abdomen and pelvis was performed using the standard protocol during bolus administration of intravenous contrast. RADIATION DOSE REDUCTION: This exam was performed according to the departmental dose-optimization program which includes automated exposure control, adjustment of the mA and/or kV according to patient size and/or use of iterative reconstruction technique. CONTRAST:  OMNIPAQUE IOHEXOL 350 MG/ML SOLN COMPARISON:  10/27/2022, 06/17/2011 FINDINGS: CTA CHEST FINDINGS Cardiovascular: This is a technically adequate evaluation of the pulmonary vasculature. No filling defects or pulmonary emboli. The heart is unremarkable without pericardial effusion. Extensive atherosclerosis of the coronary vasculature. No evidence of thoracic aortic aneurysm or dissection. Mediastinum/Nodes: No pathologic adenopathy. Tracheostomy. Otherwise the trachea is unremarkable. Segmental air esophagram is nonspecific. No esophageal wall thickening. Lungs/Pleura: Mild emphysema. Scattered tree in bud nodular opacities are seen within the bilateral upper lobes, left greater than right. There is mild bilateral bronchial wall thickening. No effusion or pneumothorax. Central airways are patent. Musculoskeletal: No acute or destructive bony abnormalities. Prior healed right posterolateral ninth through eleventh rib fractures. Reconstructed images demonstrate no additional findings. Review of the MIP images confirms the above findings. CT ABDOMEN and PELVIS FINDINGS Hepatobiliary: Small calcified gallstone without cholecystitis. The liver is unremarkable. No biliary duct dilation. Pancreas: Unremarkable. No pancreatic ductal dilatation or surrounding inflammatory changes. Spleen: Normal in size without focal abnormality. Adrenals/Urinary Tract: Adrenal glands are unremarkable. Kidneys are normal, without renal  calculi, focal lesion, or hydronephrosis. Bladder is unremarkable. Stomach/Bowel: No bowel obstruction or ileus. Normal appendix right lower quadrant. No bowel wall thickening or inflammatory change. Vascular/Lymphatic: Aortic atherosclerosis. No enlarged abdominal or pelvic lymph nodes. Reproductive: Prostate is unremarkable. Other: No free fluid or free intraperitoneal gas. Small fat containing umbilical hernia. No bowel herniation. Musculoskeletal: No acute or destructive bony abnormalities. Reconstructed images demonstrate no additional findings. Review of the MIP images confirms the above findings. IMPRESSION: 1. No evidence of pulmonary embolus. 2. Patchy tree in bud ground-glass airspace disease within the bilateral upper lobes, as well as bilateral bronchial wall thickening, consistent with bronchopneumonia. 3. Cholelithiasis without cholecystitis. 4. Otherwise no acute intra-abdominal or intrapelvic process. 5. Fat containing umbilical hernia. 6. Aortic Atherosclerosis (ICD10-I70.0). Coronary artery atherosclerosis. Electronically Signed   By: Sharlet Salina M.D.   On: 10/27/2022 19:40   DG Chest 2 View  Result Date: 10/27/2022 CLINICAL DATA:  Chest pain EXAM: CHEST - 2 VIEW COMPARISON:  12/31/2018 x-ray FINDINGS: No consolidation, pneumothorax or effusion. No edema. Normal cardiopericardial silhouette. IMPRESSION: No acute cardiopulmonary disease. Electronically Signed   By: Karen Kays M.D.   On: 10/27/2022 17:12      PROCEDURES:  Critical Care performed: No  Procedures   MEDICATIONS ORDERED IN ED: Medications  vancomycin (VANCOCIN) IVPB 1000 mg/200 mL premix (has no administration in  time range)  ceFEPIme (MAXIPIME) 2 g in sodium chloride 0.9 % 100 mL IVPB (has no administration in time range)  ondansetron (ZOFRAN) injection 4 mg (4 mg Intravenous Given 10/27/22 1905)  morphine (PF) 4 MG/ML injection 4 mg (4 mg Intravenous Given 10/27/22 1905)  iohexol (OMNIPAQUE) 350 MG/ML injection  100 mL (100 mLs Intravenous Contrast Given 10/27/22 1915)     IMPRESSION / MDM / ASSESSMENT AND PLAN / ED COURSE  I reviewed the triage vital signs and the nursing notes.                              Differential diagnosis includes, but is not limited to, possible indolent or ongoing infection, reports very slight cough no shortness of breath slight achiness in the chest with a history of previous thromboembolism but no evidence of clear ACS and he reports it is just a slight achiness has been present off-and-on for a month, seems unlikely to represent an acute ACS like picture.  His chest x-ray is normal, but given his history of pulmonary disease, etc. prior PE I think it would be prudent to obtain imaging to exclude PE and evaluate for possible other causes such as a small amount of pneumonia or bronchopneumonia etc. not evidence on chest x-ray.  He also reports intermittent abdominal discomfort with nausea.  Will obtain imaging of the upper abdomen as well to evaluate for other cause such as cholelithiasis, hepatitis, choledocholithiasis, chronic pancreatitis, etc. indolent infection, etc.  He has recently been on steroid as well, was treated with steroids when he had a sinus infection which may explain his elevated white blood cell count and perhaps does not necessarily mean that he has an active severe or bacterial infection.  Will further his workup with imaging.  He has no headache no signs or symptoms of manage meningitis encephalitis or obvious severe bacterial sinusitis at this point.    Patient's presentation is most consistent with acute complicated illness / injury requiring diagnostic workup.        Labs reviewed including CBC from urgent care via care everywhere.  White count 13.5.  Hemoglobin appropriate.  Platelet count appropriate  Metabolic panel creatinine 1.6.  Glucose 169 otherwise normal.  Normal CO2   ----------------------------------------- 8:42 PM on  10/27/2022 ----------------------------------------- Given the nature of the patient's symptoms cough elevated white count, complicated medical history including pulmonary disease and his recent use and completion of amoxicillin without improvement, suspect potential for resistant bacterium or atypical/resistant pneumonia.  Will start on broad-spectrum antibiotic.  Cultures ordered.  Additionally noted gallstones but no evidence of acute cholecystitis on imaging, will obtain right upper quadrant ultrasound to further detail though at this point given his chest achiness, cough and some intermittent abdominal discomfort he will likely active processes pneumonia and he may or may not have symptomatic cholelithiasis, but at the present time no evidence of acute cholecystitis  Consulted with and patient accepted to hospital service by Dr. Renaldo Reel.  Patient agreeable with plan for admission.  Reports pain and symptoms improved after morphine and Zofran  FINAL CLINICAL IMPRESSION(S) / ED DIAGNOSES   Final diagnoses:  RUQ abdominal pain  Bronchopneumonia     Rx / DC Orders   ED Discharge Orders     None        Note:  This document was prepared using Dragon voice recognition software and may include unintentional dictation errors.   Sharyn Creamer, MD  10/27/22 2044  

## 2022-10-28 ENCOUNTER — Inpatient Hospital Stay: Payer: HMO

## 2022-10-28 ENCOUNTER — Other Ambulatory Visit: Payer: Self-pay

## 2022-10-28 ENCOUNTER — Encounter: Payer: Self-pay | Admitting: Internal Medicine

## 2022-10-28 DIAGNOSIS — J180 Bronchopneumonia, unspecified organism: Secondary | ICD-10-CM | POA: Diagnosis not present

## 2022-10-28 LAB — CBC
HCT: 49.8 % (ref 39.0–52.0)
HCT: 52.3 % — ABNORMAL HIGH (ref 39.0–52.0)
Hemoglobin: 15.8 g/dL (ref 13.0–17.0)
Hemoglobin: 16.2 g/dL (ref 13.0–17.0)
MCH: 29 pg (ref 26.0–34.0)
MCH: 29.1 pg (ref 26.0–34.0)
MCHC: 31 g/dL (ref 30.0–36.0)
MCHC: 31.7 g/dL (ref 30.0–36.0)
MCV: 91.5 fL (ref 80.0–100.0)
MCV: 93.9 fL (ref 80.0–100.0)
Platelets: 308 10*3/uL (ref 150–400)
Platelets: 314 10*3/uL (ref 150–400)
RBC: 5.44 MIL/uL (ref 4.22–5.81)
RBC: 5.57 MIL/uL (ref 4.22–5.81)
RDW: 14.6 % (ref 11.5–15.5)
RDW: 14.6 % (ref 11.5–15.5)
WBC: 16.9 10*3/uL — ABNORMAL HIGH (ref 4.0–10.5)
WBC: 7.5 10*3/uL (ref 4.0–10.5)
nRBC: 0 % (ref 0.0–0.2)
nRBC: 0 % (ref 0.0–0.2)

## 2022-10-28 LAB — GLUCOSE, CAPILLARY
Glucose-Capillary: 74 mg/dL (ref 70–99)
Glucose-Capillary: 468 mg/dL — ABNORMAL HIGH (ref 70–99)
Glucose-Capillary: 352 mg/dL — ABNORMAL HIGH (ref 70–99)
Glucose-Capillary: 272 mg/dL — ABNORMAL HIGH (ref 70–99)
Glucose-Capillary: 235 mg/dL — ABNORMAL HIGH (ref 70–99)
Glucose-Capillary: 261 mg/dL — ABNORMAL HIGH (ref 70–99)

## 2022-10-28 LAB — PROCALCITONIN: Procalcitonin: 0.13 ng/mL

## 2022-10-28 LAB — HEMOGLOBIN A1C
Hgb A1c MFr Bld: 7.4 % — ABNORMAL HIGH (ref 4.8–5.6)
Mean Plasma Glucose: 165.68 mg/dL

## 2022-10-28 LAB — RESPIRATORY PANEL BY PCR

## 2022-10-28 LAB — COMPREHENSIVE METABOLIC PANEL
ALT: 17 U/L (ref 0–44)
AST: 23 U/L (ref 15–41)
Albumin: 3.3 g/dL — ABNORMAL LOW (ref 3.5–5.0)
Alkaline Phosphatase: 129 U/L — ABNORMAL HIGH (ref 38–126)
Anion gap: 14 (ref 5–15)
BUN: 25 mg/dL — ABNORMAL HIGH (ref 6–20)
CO2: 24 mmol/L (ref 22–32)
Calcium: 8.6 mg/dL — ABNORMAL LOW (ref 8.9–10.3)
Chloride: 97 mmol/L — ABNORMAL LOW (ref 98–111)
Creatinine, Ser: 1.8 mg/dL — ABNORMAL HIGH (ref 0.61–1.24)
GFR, Estimated: 43 mL/min — ABNORMAL LOW (ref >60)
Glucose, Bld: 211 mg/dL — ABNORMAL HIGH (ref 70–99)
Potassium: 4.1 mmol/L (ref 3.5–5.1)
Sodium: 135 mmol/L (ref 135–145)
Total Bilirubin: 0.7 mg/dL (ref 0.3–1.2)
Total Protein: 6.5 g/dL (ref 6.5–8.1)

## 2022-10-28 LAB — CREATININE, SERUM
Creatinine, Ser: 1.78 mg/dL — ABNORMAL HIGH (ref 0.61–1.24)
GFR, Estimated: 43 mL/min — ABNORMAL LOW (ref >60)

## 2022-10-28 LAB — VITAMIN D 25 HYDROXY (VIT D DEFICIENCY, FRACTURES): Vit D, 25-Hydroxy: 30.26 ng/mL (ref 30–100)

## 2022-10-28 LAB — HIV ANTIBODY (ROUTINE TESTING W REFLEX): HIV Screen 4th Generation wRfx: NONREACTIVE

## 2022-10-28 MED ORDER — ZOLPIDEM TARTRATE 5 MG PO TABS
10.0000 mg | ORAL_TABLET | Freq: Every evening | ORAL | Status: DC | PRN
  Administered 2022-10-28 – 2022-10-29 (×3): 10 mg via ORAL
  Filled 2022-10-28 (×3): qty 2

## 2022-10-28 MED ORDER — PANTOPRAZOLE SODIUM 40 MG PO TBEC
40.0000 mg | DELAYED_RELEASE_TABLET | Freq: Two times a day (BID) | ORAL | Status: DC
  Administered 2022-10-28 – 2022-10-30 (×6): 40 mg via ORAL
  Filled 2022-10-28 (×6): qty 1

## 2022-10-28 MED ORDER — IPRATROPIUM-ALBUTEROL 0.5-2.5 (3) MG/3ML IN SOLN
3.0000 mL | Freq: Four times a day (QID) | RESPIRATORY_TRACT | Status: DC
  Administered 2022-10-28 – 2022-10-29 (×5): 3 mL via RESPIRATORY_TRACT
  Filled 2022-10-28 (×6): qty 3

## 2022-10-28 MED ORDER — SODIUM CHLORIDE 0.9 % IV SOLN
500.0000 mg | INTRAVENOUS | Status: AC
  Administered 2022-10-28 – 2022-10-30 (×3): 500 mg via INTRAVENOUS
  Filled 2022-10-28 (×4): qty 5

## 2022-10-28 MED ORDER — VENLAFAXINE HCL ER 75 MG PO CP24
75.0000 mg | ORAL_CAPSULE | Freq: Every day | ORAL | Status: DC
  Administered 2022-10-28 – 2022-10-30 (×3): 75 mg via ORAL
  Filled 2022-10-28 (×3): qty 1

## 2022-10-28 MED ORDER — SODIUM CHLORIDE 0.9 % IV SOLN
12.5000 mg | Freq: Four times a day (QID) | INTRAVENOUS | Status: DC | PRN
  Administered 2022-10-28: 12.5 mg via INTRAVENOUS
  Filled 2022-10-28: qty 12.5

## 2022-10-28 MED ORDER — ONDANSETRON HCL 4 MG/2ML IJ SOLN
4.0000 mg | Freq: Once | INTRAMUSCULAR | Status: AC
  Administered 2022-10-28: 4 mg via INTRAVENOUS
  Filled 2022-10-28: qty 2

## 2022-10-28 MED ORDER — ONDANSETRON HCL 4 MG/2ML IJ SOLN
4.0000 mg | Freq: Four times a day (QID) | INTRAMUSCULAR | Status: DC | PRN
  Administered 2022-10-28 (×2): 4 mg via INTRAVENOUS
  Filled 2022-10-28 (×2): qty 2

## 2022-10-28 MED ORDER — BUDESONIDE 0.25 MG/2ML IN SUSP
0.2500 mg | Freq: Two times a day (BID) | RESPIRATORY_TRACT | Status: DC
  Administered 2022-10-28 – 2022-10-30 (×5): 0.25 mg via RESPIRATORY_TRACT
  Filled 2022-10-28 (×5): qty 2

## 2022-10-28 MED ORDER — METOPROLOL SUCCINATE ER 25 MG PO TB24
25.0000 mg | ORAL_TABLET | Freq: Every day | ORAL | Status: DC
  Administered 2022-10-28 – 2022-10-30 (×3): 25 mg via ORAL
  Filled 2022-10-28 (×3): qty 1

## 2022-10-28 MED ORDER — GUAIFENESIN ER 600 MG PO TB12
600.0000 mg | ORAL_TABLET | Freq: Two times a day (BID) | ORAL | Status: DC
  Administered 2022-10-28 – 2022-10-30 (×5): 600 mg via ORAL
  Filled 2022-10-28 (×5): qty 1

## 2022-10-28 MED ORDER — INSULIN PUMP
Freq: Three times a day (TID) | SUBCUTANEOUS | Status: DC
  Filled 2022-10-28: qty 1

## 2022-10-28 MED ORDER — SODIUM CHLORIDE 0.9 % IV SOLN
2.0000 g | INTRAVENOUS | Status: DC
  Administered 2022-10-28 – 2022-10-30 (×3): 2 g via INTRAVENOUS
  Filled 2022-10-28 (×3): qty 20

## 2022-10-28 NOTE — Assessment & Plan Note (Signed)
RUQ is negative for murphy and or stones.  Pain control may need outpatient gi eval if abd pain is persistent  and may be PUD related.

## 2022-10-28 NOTE — Assessment & Plan Note (Signed)
Suspect patient may have dysphagia from chronic scarring reflux or peptic ulcer disease.  Continue with PPI therapy.

## 2022-10-28 NOTE — Assessment & Plan Note (Signed)
Continued on Eliquis.   

## 2022-10-28 NOTE — Assessment & Plan Note (Signed)
Patient continued on bupropion and venlafaxine.

## 2022-10-28 NOTE — Assessment & Plan Note (Signed)
Patient continued on amlodipine and Coreg and metoprolol.  ACE inhibitor and diuretic therapy held secondary to abnormal kidney function.

## 2022-10-28 NOTE — Assessment & Plan Note (Signed)
Cont pt on sepsis based abx protocol per pharmacy. Prn antitussives.

## 2022-10-28 NOTE — Inpatient Diabetes Management (Addendum)
Inpatient Diabetes Program Recommendations  AACE/ADA: New Consensus Statement on Inpatient Glycemic Control  Target Ranges:  Prepandial:   less than 140 mg/dL      Peak postprandial:   less than 180 mg/dL (1-2 hours)      Critically ill patients:  140 - 180 mg/dL    Latest Reference Range & Units 10/28/22 02:09 10/28/22 08:01  Glucose-Capillary 70 - 99 mg/dL 74 130 (H)    Latest Reference Range & Units 10/28/22 06:25  CO2 22 - 32 mmol/L 24  Glucose 70 - 99 mg/dL 865 (H)  Anion gap 5 - 15  14   Review of Glycemic Control  Diabetes history: Type 1 DM (does NOT make any insulin; requires basal, correction, and carb coverage insulin) Outpatient Diabetes medications: T-Slim insulin pump with Humalog (0.9 unit/hour; 1:CR 1:4 grams, I:SF 1:25 mg/dl), Jardiance 25 mg daily, Dexcom G6 CGM Current orders for Inpatient glycemic control: None  Inpatient Diabetes Program Recommendations:    Insulin:  If glucose does not improve, may need to have patient remove insulin pump and order basal, correction, and meal coverage SQ insulin regimen. IF insulin pump is removed, consider ordering Semglee 20 units Q24H, CBGs AC&HS, Novolog 0-9 units TID with meals, Novolog 0-5 units at bedtime, and Novolog 5 units TID with meals for meal coverage if patient eats at least 50% of meals.   NOTE: Noted consult for Diabetes Coordinator. Diabetes Coordinator is not on campus over the weekend but available by pager from 8am to 5pm for questions or concerns. Sent chat message to [place], [place] and Dr. Lucianne Muss to ask if patient has insulin pump on. RN reports that patient currently has insulin pump on and patient done a correction for glucose of 468 mg/dl this am. Patient admitted with abdominal pain, pneumonia, dysphagia, acute embolism and thrombosis. Patient has Type 1 DM and uses an insulin pump outpatient for DM control. Per chart review, noted patient sees Dr. Gershon Crane (Endocrinologist) and was last seen on 08/10/22. Per office  note on 08/10/22, A1C was 7.8% and patient uses T-Slim insulin pump with Novolog insulin and the following should be pump settings based on note: Basal 12A  0.9 units/hour Total basal: 21.6 units per day Insulin to Carb Ratio 1:4 grams (1 unit for every 4 grams of carbs) Insulin Sensitivity Factor 1:25 mg/dl (1 unit drops glucose 25 mg/dl) Target Glucose  784 mg/dl  If glucose does not improve this morning, would recommend to have patient remove the insulin pump and order SQ insulin regimen. Patient has trach so not able to communicate over the phone.  Ana, RN rechecked glucose at 9:23 am and CBG down to 352 mg/dl currently. Patient reports that at home he gives himself small boluses of insulin when glucose is elevated so he doesn't drop too much. Patient reports that he will give himself 4.5 units now for correction of 352 mg/dl. RN will plan to call me back shortly so she can communicate between me and the patient.  Addendum 10/28/22@10 :35-Spoke with patient and his mother Karen Kitchens) over the phone (with the help of his mother at bedside). Patient confirms that his uses T-Slim insulin pump with Humalog insulin and Dexcom G6 CGM and also takes Jardiance 25 mg daily.  Patient was able to confirm basal and carb correction factor (same as noted above) but not able to find the sensitivity factor to confirm the above setting. Patient reports that he typically does small boluses for correction because he has been having an issue  with hypoglycemia and then rebound hyperglycemia (likely from over treating hypoglycemia). Explained that if he is experiencing hypoglycemia frequently, he may need to reach out to Dr. Gershon Crane so they can help with making adjustments to pump settings if needed. Encouraged patient to treat hypoglycemia with 15 grams of carbs and wait 15 mins and recheck glucose to help with over treating which leads to hyperglycemia. Patient reports that his mother is going to be bringing him a new  infusion site so he will have extra pump supplies at bedside. Discussed importance of keeping glucose fairly well controlled and encouraged patient to pay close attention to his glucose on Dexcom CGM and be sure he is doing correction boluses when needed. RN in room with patient and reports that current CBG 211 mg/dl. Explained to patient that if CBGs remain consistently elevated, we may need to discontinue insulin pump and use SQ insulin regimen. Patient would prefer to keep insulin pump on. Informed patient that inpatient diabetes team will follow along while inpatient and if he has any issues or concerns regarding DM to ask nursing staff to page our team. Patient verbalized understanding of information discussed and he has no questions at this time.  Thanks, Orlando Penner, RN, MSN, CDCES Diabetes Coordinator Inpatient Diabetes Program [phone] (Team Pager from 8am to 5pm)

## 2022-10-28 NOTE — Assessment & Plan Note (Signed)
Continue with PPI therapy 

## 2022-10-28 NOTE — Progress Notes (Signed)
BG 468, MD Kumar notified. MD placed Diabetes coordinator consult. Pt administered 3 units form insulin pump. Recheck 352, MD Notified, no new orders received.

## 2022-10-28 NOTE — Progress Notes (Addendum)
[facility] Progress Note  Patient: [name]    NWG:[id number]  DOA: 10/27/2022     Date of Service: the patient was seen and examined on 10/28/2022  Chief Complaint  Patient presents with   Chest Pain   Brief hospital course:  [name] is a 62 y.o. male with PMH of  Diabetes mellitus type 1, hyperlipidemia, hypertension, stroke, suicidal attempt. Abdominal pain/ chest pain midsternal for the past few weeks, sent over from  [place] [facility] for productive cough, chest congestion, tachypneic, night sweats and chills.  As per patient his symptoms have been going on for about a week chest pain has been going on off and on for about a month.    ED workup:-Tachypneic and tachycardia, BP 140/88 WBC within normal range, procalcitonin 0.13 lactic acid 1.9 within normal range, BNP 38.5 within normal range UDS negative,  CTA chest negative for PE, groundglass opacity bilateral upper lobes and bilateral bronchial wall thickening consistent with bronchopneumonia CT abd: Cholelithiasis, no any other acute findings Tried hospitalist consulted for admission and further management as below  Assessment and Plan:  # Bronchopneumonia  S/p cefepime and vancomycin Started azithromycin and ceftriaxone Started Mucinex twice daily Started up every 6 hourly scheduled, transition to as needed after improvement Started budesonide nebulizer twice daily Follow RVP to rule out viral infection   Diabetes mellitus type 1, HbA1c 7.4 well-controlled Patient is using insulin pump 0.9 units (from Endocrinology note) every hour for his basal needs.  His correction is 1 unit for every 4 grams of carbs  Continue to monitor CBG Continue diabetic diet  AKI Could be secondary to dehydration Monitor renal functions and urine output Continue gentle hydration RN was advised to do bladder scan to rule out urinary retention Follow renal sonogram  Abdominal pain, mostly patient is complaining of midsternal  discomfort. CT A/P positive for cholelithiasis, no any other findings Recommend to follow with PCP and general surgery as an outpatient Hypertension, continued amlodipine 5 mg p.o. daily, Coreg 25 mg p.o. twice daily, Toprol-XL 25 mg GERD, continue PPI 40 mg p.o. twice daily History of PE, continued Eliquis 5 mg p.o. twice daily Hypothyroid, continued Synthroid Depression, continued Wellbutrin and Effexor home dose Generalized weakness and lethargy, vitamin D level 30.2 wnl, at lower end   Body mass index is 35.94 kg/m.  Interventions:  Diet: Continue diabetic diet DVT Prophylaxis: Therapeutic Anticoagulation with Eliquis    Advance goals of care discussion: Full code  Family Communication: family was not present at bedside, at the time of interview.  The pt provided permission to discuss medical plan with the family. Opportunity was given to ask question and all questions were answered satisfactorily.   Disposition:  Pt is from Home, admitted with PNA, still has sob, which precludes a safe discharge. Discharge to home, when stable.  Subjective: No significant events overnight, patient was admitted with pneumonia, still has shortness of breath, congestion and phlegm production.  Denies any other active issues.  Physical Exam: General: NAD, lying comfortably Appear in no distress, affect appropriate Eyes: PERRLA ENT: Oral Mucosa Clear, moist  Neck: no JVD,  Cardiovascular: S1 and S2 Present, no Murmur,  Respiratory: good air entry bilaterally, mild  bilateral crackles, mild wheezing. S/p tracheostomy, intact Abdomen: Bowel Sound present, Soft and no tenderness,  Skin: no rashes Extremities: no Pedal edema, no calf tenderness, s/p left BKA Neurologic: without any new focal findings Gait not checked due to patient safety concerns  Vitals:  10/28/22 0851 10/28/22 1142 10/28/22 1340 10/28/22 1446  BP:      Pulse:    80  Resp: 20     Temp:      TempSrc:      SpO2:  92% 95%    Weight:      Height:        Intake/Output Summary (Last 24 hours) at 10/28/2022 1533 Last data filed at 10/28/2022 1439 Gross per 24 hour  Intake 300 ml  Output 800 ml  Net -500 ml   Filed Weights   10/27/22 1648  Weight: 120.2 kg    Data Reviewed: I have personally reviewed and interpreted daily labs, tele strips, imagings as discussed above. I reviewed all nursing notes, pharmacy notes, vitals, pertinent old records I have discussed plan of care as described above with RN and patient/family.  CBC: Recent Labs  Lab 10/28/22 0012 10/28/22 0625  WBC 7.5 16.9*  HGB 15.8 16.2  HCT 49.8 52.3*  MCV 91.5 93.9  PLT 314 308   Basic Metabolic Panel: Recent Labs  Lab 10/27/22 2051 10/28/22 0012 10/28/22 0625  NA  --   --  135  K  --   --  4.1  CL  --   --  97*  CO2  --   --  24  GLUCOSE  --   --  211*  BUN  --   --  25*  CREATININE  --  1.78* 1.80*  CALCIUM  --   --  8.6*  MG 2.5*  --   --     Studies: US ABDOMEN LIMITED RUQ (LIVER/GB)  Result Date: 10/27/2022 CLINICAL DATA:  Abdomen pain EXAM: ULTRASOUND ABDOMEN LIMITED RIGHT UPPER QUADRANT COMPARISON:  None CT 10/27/2022 FINDINGS: Gallbladder: No gallstones or wall thickening visualized. No sonographic Murphy sign noted by sonographer. Common bile duct: Diameter: 5.2 mm Liver: Slightly limited assessment for focal parenchymal abnormality. Portal vein is patent on color Doppler imaging with normal direction of blood flow towards the liver. IMPRESSION: Negative right upper quadrant ultrasound, small stones seen on CT are not well seen sonographically Electronically Signed   By: Jasmine Pang M.D.   On: 10/27/2022 21:26   CT Angio Chest PE W and/or Wo Contrast  Result Date: 10/27/2022 CLINICAL DATA:  Midsternal chest pain, right upper quadrant pain, elevated white blood cell count EXAM: CT ANGIOGRAPHY CHEST CT ABDOMEN AND PELVIS WITH CONTRAST TECHNIQUE: Multidetector CT imaging of the chest was performed using the  standard protocol during bolus administration of intravenous contrast. Multiplanar CT image reconstructions and MIPs were obtained to evaluate the vascular anatomy. Multidetector CT imaging of the abdomen and pelvis was performed using the standard protocol during bolus administration of intravenous contrast. RADIATION DOSE REDUCTION: This exam was performed according to the departmental dose-optimization program which includes automated exposure control, adjustment of the mA and/or kV according to patient size and/or use of iterative reconstruction technique. CONTRAST:  OMNIPAQUE IOHEXOL 350 MG/ML SOLN COMPARISON:  10/27/2022, 06/17/2011 FINDINGS: CTA CHEST FINDINGS Cardiovascular: This is a technically adequate evaluation of the pulmonary vasculature. No filling defects or pulmonary emboli. The heart is unremarkable without pericardial effusion. Extensive atherosclerosis of the coronary vasculature. No evidence of thoracic aortic aneurysm or dissection. Mediastinum/Nodes: No pathologic adenopathy. Tracheostomy. Otherwise the trachea is unremarkable. Segmental air esophagram is nonspecific. No esophageal wall thickening. Lungs/Pleura: Mild emphysema. Scattered tree in bud nodular opacities are seen within the bilateral upper lobes, left greater than right. There is mild bilateral bronchial wall  thickening. No effusion or pneumothorax. Central airways are patent. Musculoskeletal: No acute or destructive bony abnormalities. Prior healed right posterolateral ninth through eleventh rib fractures. Reconstructed images demonstrate no additional findings. Review of the MIP images confirms the above findings. CT ABDOMEN and PELVIS FINDINGS Hepatobiliary: Small calcified gallstone without cholecystitis. The liver is unremarkable. No biliary duct dilation. Pancreas: Unremarkable. No pancreatic ductal dilatation or surrounding inflammatory changes. Spleen: Normal in size without focal abnormality. Adrenals/Urinary  Tract: Adrenal glands are unremarkable. Kidneys are normal, without renal calculi, focal lesion, or hydronephrosis. Bladder is unremarkable. Stomach/Bowel: No bowel obstruction or ileus. Normal appendix right lower quadrant. No bowel wall thickening or inflammatory change. Vascular/Lymphatic: Aortic atherosclerosis. No enlarged abdominal or pelvic lymph nodes. Reproductive: Prostate is unremarkable. Other: No free fluid or free intraperitoneal gas. Small fat containing umbilical hernia. No bowel herniation. Musculoskeletal: No acute or destructive bony abnormalities. Reconstructed images demonstrate no additional findings. Review of the MIP images confirms the above findings. IMPRESSION: 1. No evidence of pulmonary embolus. 2. Patchy tree in bud ground-glass airspace disease within the bilateral upper lobes, as well as bilateral bronchial wall thickening, consistent with bronchopneumonia. 3. Cholelithiasis without cholecystitis. 4. Otherwise no acute intra-abdominal or intrapelvic process. 5. Fat containing umbilical hernia. 6. Aortic Atherosclerosis (ICD10-I70.0). Coronary artery atherosclerosis. Electronically Signed   By: Sharlet Salina M.D.   On: 10/27/2022 19:40   CT ABDOMEN PELVIS W CONTRAST  Result Date: 10/27/2022 CLINICAL DATA:  Midsternal chest pain, right upper quadrant pain, elevated white blood cell count EXAM: CT ANGIOGRAPHY CHEST CT ABDOMEN AND PELVIS WITH CONTRAST TECHNIQUE: Multidetector CT imaging of the chest was performed using the standard protocol during bolus administration of intravenous contrast. Multiplanar CT image reconstructions and MIPs were obtained to evaluate the vascular anatomy. Multidetector CT imaging of the abdomen and pelvis was performed using the standard protocol during bolus administration of intravenous contrast. RADIATION DOSE REDUCTION: This exam was performed according to the departmental dose-optimization program which includes automated exposure control,  adjustment of the mA and/or kV according to patient size and/or use of iterative reconstruction technique. CONTRAST:  OMNIPAQUE IOHEXOL 350 MG/ML SOLN COMPARISON:  10/27/2022, 06/17/2011 FINDINGS: CTA CHEST FINDINGS Cardiovascular: This is a technically adequate evaluation of the pulmonary vasculature. No filling defects or pulmonary emboli. The heart is unremarkable without pericardial effusion. Extensive atherosclerosis of the coronary vasculature. No evidence of thoracic aortic aneurysm or dissection. Mediastinum/Nodes: No pathologic adenopathy. Tracheostomy. Otherwise the trachea is unremarkable. Segmental air esophagram is nonspecific. No esophageal wall thickening. Lungs/Pleura: Mild emphysema. Scattered tree in bud nodular opacities are seen within the bilateral upper lobes, left greater than right. There is mild bilateral bronchial wall thickening. No effusion or pneumothorax. Central airways are patent. Musculoskeletal: No acute or destructive bony abnormalities. Prior healed right posterolateral ninth through eleventh rib fractures. Reconstructed images demonstrate no additional findings. Review of the MIP images confirms the above findings. CT ABDOMEN and PELVIS FINDINGS Hepatobiliary: Small calcified gallstone without cholecystitis. The liver is unremarkable. No biliary duct dilation. Pancreas: Unremarkable. No pancreatic ductal dilatation or surrounding inflammatory changes. Spleen: Normal in size without focal abnormality. Adrenals/Urinary Tract: Adrenal glands are unremarkable. Kidneys are normal, without renal calculi, focal lesion, or hydronephrosis. Bladder is unremarkable. Stomach/Bowel: No bowel obstruction or ileus. Normal appendix right lower quadrant. No bowel wall thickening or inflammatory change. Vascular/Lymphatic: Aortic atherosclerosis. No enlarged abdominal or pelvic lymph nodes. Reproductive: Prostate is unremarkable. Other: No free fluid or free intraperitoneal gas. Small fat  containing umbilical hernia. No bowel herniation.  Musculoskeletal: No acute or destructive bony abnormalities. Reconstructed images demonstrate no additional findings. Review of the MIP images confirms the above findings. IMPRESSION: 1. No evidence of pulmonary embolus. 2. Patchy tree in bud ground-glass airspace disease within the bilateral upper lobes, as well as bilateral bronchial wall thickening, consistent with bronchopneumonia. 3. Cholelithiasis without cholecystitis. 4. Otherwise no acute intra-abdominal or intrapelvic process. 5. Fat containing umbilical hernia. 6. Aortic Atherosclerosis (ICD10-I70.0). Coronary artery atherosclerosis. Electronically Signed   By: Sharlet Salina M.D.   On: 10/27/2022 19:40   DG Chest 2 View  Result Date: 10/27/2022 CLINICAL DATA:  Chest pain EXAM: CHEST - 2 VIEW COMPARISON:  12/31/2018 x-ray FINDINGS: No consolidation, pneumothorax or effusion. No edema. Normal cardiopericardial silhouette. IMPRESSION: No acute cardiopulmonary disease. Electronically Signed   By: Karen Kays M.D.   On: 10/27/2022 17:12    Scheduled Meds:  amLODipine  5 mg Oral Daily   apixaban  5 mg Oral BID   budesonide (PULMICORT) nebulizer solution  0.25 mg Nebulization BID   buPROPion  300 mg Oral Daily   carvedilol  25 mg Oral BID WC   guaiFENesin  600 mg Oral BID   insulin pump   Subcutaneous TID WC, HS, 0200   ipratropium-albuterol  3 mL Nebulization Q6H   levothyroxine  75 mcg Oral Q0600   metoprolol succinate  25 mg Oral Daily   pantoprazole  40 mg Oral BID   sodium chloride flush  3 mL Intravenous Q12H   venlafaxine XR  75 mg Oral Q breakfast   Continuous Infusions:  azithromycin 500 mg (10/28/22 1111)   cefTRIAXone (ROCEPHIN)  IV Stopped (10/28/22 1109)   lactated ringers 50 mL/hr at 10/28/22 0020   PRN Meds: acetaminophen **OR** acetaminophen, clonazePAM, HYDROcodone-acetaminophen, morphine injection, ondansetron (ZOFRAN) IV, zolpidem  Time spent: 55  minutes  Author: Gillis Santa. MD [facility] 10/28/2022 3:33 PM  To reach On-call, see care teams to locate the attending and reach out to them via [email or web address]. If 7PM-7AM, please contact night-coverage If you still have difficulty reaching the attending provider, please page the [facility] (Director on Call) for [facility] on amion for assistance.

## 2022-10-28 NOTE — Assessment & Plan Note (Signed)
Cont with carb consistent diet and Glycemic protocol.

## 2022-10-28 NOTE — Assessment & Plan Note (Signed)
Trach in place and trach care ordered.

## 2022-10-28 NOTE — Assessment & Plan Note (Signed)
Continue patient on levothyroxine 75 mcg.

## 2022-10-29 DIAGNOSIS — J180 Bronchopneumonia, unspecified organism: Secondary | ICD-10-CM | POA: Diagnosis not present

## 2022-10-29 LAB — CBC
HCT: 42.8 % (ref 39.0–52.0)
Hemoglobin: 13.9 g/dL (ref 13.0–17.0)
MCH: 29.7 pg (ref 26.0–34.0)
MCHC: 32.5 g/dL (ref 30.0–36.0)
MCV: 91.5 fL (ref 80.0–100.0)
Platelets: 217 10*3/uL (ref 150–400)
RBC: 4.68 MIL/uL (ref 4.22–5.81)
RDW: 14.7 % (ref 11.5–15.5)
WBC: 11.3 10*3/uL — ABNORMAL HIGH (ref 4.0–10.5)
nRBC: 0 % (ref 0.0–0.2)

## 2022-10-29 LAB — BASIC METABOLIC PANEL
Anion gap: 11 (ref 5–15)
BUN: 34 mg/dL — ABNORMAL HIGH (ref 6–20)
CO2: 21 mmol/L — ABNORMAL LOW (ref 22–32)
Calcium: 7.8 mg/dL — ABNORMAL LOW (ref 8.9–10.3)
Chloride: 94 mmol/L — ABNORMAL LOW (ref 98–111)
Creatinine, Ser: 1.89 mg/dL — ABNORMAL HIGH (ref 0.61–1.24)
GFR, Estimated: 40 mL/min — ABNORMAL LOW (ref >60)
Glucose, Bld: 289 mg/dL — ABNORMAL HIGH (ref 70–99)
Potassium: 4 mmol/L (ref 3.5–5.1)
Sodium: 133 mmol/L — ABNORMAL LOW (ref 135–145)

## 2022-10-29 LAB — CULTURE, BLOOD (ROUTINE X 2): Special Requests: ADEQUATE

## 2022-10-29 LAB — GLUCOSE, CAPILLARY
Glucose-Capillary: 246 mg/dL — ABNORMAL HIGH (ref 70–99)
Glucose-Capillary: 360 mg/dL — ABNORMAL HIGH (ref 70–99)
Glucose-Capillary: 282 mg/dL — ABNORMAL HIGH (ref 70–99)
Glucose-Capillary: 153 mg/dL — ABNORMAL HIGH (ref 70–99)
Glucose-Capillary: 146 mg/dL — ABNORMAL HIGH (ref 70–99)

## 2022-10-29 LAB — MAGNESIUM: Magnesium: 2.1 mg/dL (ref 1.7–2.4)

## 2022-10-29 LAB — PHOSPHORUS: Phosphorus: 2.6 mg/dL (ref 2.5–4.6)

## 2022-10-29 MED ORDER — TAMSULOSIN HCL 0.4 MG PO CAPS
0.4000 mg | ORAL_CAPSULE | Freq: Every day | ORAL | Status: DC
  Administered 2022-10-29 (×2): 0.4 mg via ORAL
  Filled 2022-10-29 (×2): qty 1

## 2022-10-29 MED ORDER — INSULIN GLARGINE-YFGN 100 UNIT/ML ~~LOC~~ SOLN
20.0000 [IU] | Freq: Every day | SUBCUTANEOUS | Status: DC
  Filled 2022-10-29: qty 0.2

## 2022-10-29 MED ORDER — INSULIN ASPART 100 UNIT/ML IJ SOLN: 0.0000 [IU] | Freq: Three times a day (TID) | INTRAMUSCULAR | Status: DC

## 2022-10-29 MED ORDER — BENZOCAINE 10 % MT GEL
Freq: Three times a day (TID) | OROMUCOSAL | Status: DC | PRN
  Filled 2022-10-29: qty 9

## 2022-10-29 MED ORDER — SODIUM CHLORIDE 0.9 % IV SOLN: INTRAVENOUS | Status: AC

## 2022-10-29 NOTE — Progress Notes (Signed)
[facility] Progress Note  Patient: [name]    WGN:[id number]  DOA: 10/27/2022     Date of Service: the patient was seen and examined on 10/29/2022  Chief Complaint  Patient presents with   Chest Pain   Brief hospital course:  [name] is a 62 y.o. male with PMH of  Diabetes mellitus type 1, hyperlipidemia, hypertension, stroke, suicidal attempt. Abdominal pain/ chest pain midsternal for the past few weeks, sent over from  [place] [facility] for productive cough, chest congestion, tachypneic, night sweats and chills.  As per patient his symptoms have been going on for about a week chest pain has been going on off and on for about a month.    ED workup:-Tachypneic and tachycardia, BP 140/88 WBC within normal range, procalcitonin 0.13 lactic acid 1.9 within normal range, BNP 38.5 within normal range UDS negative,  CTA chest negative for PE, groundglass opacity bilateral upper lobes and bilateral bronchial wall thickening consistent with bronchopneumonia CT abd: Cholelithiasis, no any other acute findings Tried hospitalist consulted for admission and further management as below  Assessment and Plan:  # Bronchopneumonia  S/p cefepime and vancomycin Started azithromycin and ceftriaxone Started Mucinex twice daily Started up every 6 hourly scheduled, transition to as needed after improvement Started budesonide nebulizer twice daily RVP negative for any viral infection    Diabetes mellitus type 1, HbA1c 7.4 well-controlled Patient is using insulin pump 0.9 units (from Endocrinology note) every hour for his basal needs.  His correction is 1 unit for every 4 grams of carbs  Continue to monitor CBG Continue diabetic diet  AKI Could be secondary to dehydration Monitor renal functions and urine output Continue gentle hydration NS 75 mill per hour US renal Unremarkable sonographic appearance of the kidneys.  Bladder scan done, patient is voiding well.  No urinary  retention  Abdominal pain, mostly patient is complaining of midsternal discomfort. CT A/P positive for cholelithiasis, no any other findings Recommend to follow with PCP and general surgery as an outpatient Hypertension, continued amlodipine 5 mg p.o. daily, Coreg 25 mg p.o. twice daily, Toprol-XL 25 mg GERD, continue PPI 40 mg p.o. twice daily History of PE, continued Eliquis 5 mg p.o. twice daily Hypothyroid, continued Synthroid Depression, continued Wellbutrin and Effexor home dose Generalized weakness and lethargy, vitamin D level 30.2 wnl, at lower end   Body mass index is 35.94 kg/m.  Interventions:  Diet: Continue diabetic diet DVT Prophylaxis: Therapeutic Anticoagulation with Eliquis    Advance goals of care discussion: Full code  Family Communication: family was not present at bedside, at the time of interview.  The pt provided permission to discuss medical plan with the family. Opportunity was given to ask question and all questions were answered satisfactorily.   Disposition:  Pt is from Home, admitted with PNA, still has sob, which precludes a safe discharge. Discharge to home, when stable.  Subjective: No significant events overnight, patient feels improvement in the shortness of breath, still has productive cough.  Abdominal pain resolved.  Denies any chest pain or palpitation, no any other active issues. Patient's insulin pump got dislodged, and he applied it back.  Patient would like to continue to use insulin pump.  Physical Exam: General: NAD, lying comfortably Appear in no distress, affect appropriate Eyes: PERRLA ENT: Oral Mucosa Clear, moist  Neck: no JVD,  Cardiovascular: S1 and S2 Present, no Murmur,  Respiratory: good air entry bilaterally, mild  bilateral crackles, mild wheezing. S/p tracheostomy, intact Abdomen: Bowel  Sound present, Soft and no tenderness,  Skin: no rashes Extremities: no Pedal edema, no calf tenderness, s/p left BKA Neurologic:  without any new focal findings Gait not checked due to patient safety concerns  Vitals:   10/28/22 1552 10/29/22 0027 10/29/22 0806 10/29/22 0808  BP: (!) 142/62 133/73 133/74   Pulse: 77 74 77   Resp: 20 18 16    Temp: 99.3 F (37.4 C) 98.8 F (37.1 C) 98.9 F (37.2 C)   TempSrc:      SpO2: 92% 95% 94% 94%  Weight:      Height:        Intake/Output Summary (Last 24 hours) at 10/29/2022 1323 Last data filed at 10/29/2022 3329 Gross per 24 hour  Intake 400 ml  Output 1500 ml  Net -1100 ml   Filed Weights   10/27/22 1648  Weight: 120.2 kg    Data Reviewed: I have personally reviewed and interpreted daily labs, tele strips, imagings as discussed above. I reviewed all nursing notes, pharmacy notes, vitals, pertinent old records I have discussed plan of care as described above with RN and patient/family.  CBC: Recent Labs  Lab 10/28/22 0012 10/28/22 0625 10/29/22 0435  WBC 7.5 16.9* 11.3*  HGB 15.8 16.2 13.9  HCT 49.8 52.3* 42.8  MCV 91.5 93.9 91.5  PLT 314 308 217   Basic Metabolic Panel: Recent Labs  Lab 10/27/22 2051 10/28/22 0012 10/28/22 0625 10/29/22 0435  NA  --   --  135 133*  K  --   --  4.1 4.0  CL  --   --  97* 94*  CO2  --   --  24 21*  GLUCOSE  --   --  211* 289*  BUN  --   --  25* 34*  CREATININE  --  1.78* 1.80* 1.89*  CALCIUM  --   --  8.6* 7.8*  MG 2.5*  --   --  2.1  PHOS  --   --   --  2.6    Studies: US RENAL  Result Date: 10/28/2022 CLINICAL DATA:  [id number] AKI (acute kidney injury) (HCC) [id number] EXAM: RENAL / URINARY TRACT ULTRASOUND COMPLETE COMPARISON:  CT 10/27/2022 FINDINGS: Right Kidney: Renal measurements: 11.6 x 6.9 x 6.2 cm = volume: 261 mL. Echogenicity within normal limits. No mass or hydronephrosis visualized. Left Kidney: Renal measurements: 11.8 x 6.4 x 5.7 cm = volume: 223 mL. Echogenicity within normal limits. No mass or hydronephrosis visualized. Bladder: Appears normal for degree of bladder distention. Other: None.  IMPRESSION: Unremarkable sonographic appearance of the kidneys. Electronically Signed   By: Duanne Guess D.O.   On: 10/28/2022 16:51    Scheduled Meds:  amLODipine  5 mg Oral Daily   apixaban  5 mg Oral BID   budesonide (PULMICORT) nebulizer solution  0.25 mg Nebulization BID   buPROPion  300 mg Oral Daily   carvedilol  25 mg Oral BID WC   guaiFENesin  600 mg Oral BID   insulin pump   Subcutaneous TID WC, HS, 0200   ipratropium-albuterol  3 mL Nebulization Q6H   levothyroxine  75 mcg Oral Q0600   metoprolol succinate  25 mg Oral Daily   pantoprazole  40 mg Oral BID   sodium chloride flush  3 mL Intravenous Q12H   tamsulosin  0.4 mg Oral QPC supper   venlafaxine XR  75 mg Oral Q breakfast   Continuous Infusions:  azithromycin 500 mg (10/29/22 1036)   cefTRIAXone (ROCEPHIN)  IV 2 g (10/29/22 1013)   promethazine (PHENERGAN) injection (IM or IVPB) 12.5 mg (10/28/22 2122)   PRN Meds: acetaminophen **OR** acetaminophen, clonazePAM, HYDROcodone-acetaminophen, morphine injection, ondansetron (ZOFRAN) IV, promethazine (PHENERGAN) injection (IM or IVPB), zolpidem  Time spent: 40 minutes  Author: Gillis Santa. MD [facility] 10/29/2022 1:23 PM  To reach On-call, see care teams to locate the attending and reach out to them via [email or web address]. If 7PM-7AM, please contact night-coverage If you still have difficulty reaching the attending provider, please page the [facility] (Director on Call) for [facility] on amion for assistance.

## 2022-10-29 NOTE — Inpatient Diabetes Management (Signed)
Inpatient Diabetes Program Recommendations  AACE/ADA: New Consensus Statement on Inpatient Glycemic Control   Target Ranges:  Prepandial:   less than 140 mg/dL      Peak postprandial:   less than 180 mg/dL (1-2 hours)      Critically ill patients:  140 - 180 mg/dL    Latest Reference Range & Units 10/29/22 03:00 10/29/22 07:40  Glucose-Capillary 70 - 99 mg/dL 409 (H) 811 (H)    Latest Reference Range & Units 10/28/22 08:01 10/28/22 09:19 10/28/22 11:32 10/28/22 17:14 10/28/22 21:19  Glucose-Capillary 70 - 99 mg/dL 914 (H) 782 (H) 956 (H) 235 (H) 261 (H)   Review of Glycemic Control  Diabetes history: Type 1 DM (does NOT make any insulin; requires basal, correction, and carb coverage insulin) Outpatient Diabetes medications: T-Slim insulin pump with Humalog (0.9 unit/hour; 1:CR 1:4 grams, I:SF 1:25 mg/dl), Jardiance 25 mg daily, Dexcom G6 CGM Current orders for Inpatient glycemic control: Semglee 20 units daily, Novolog 0-15 units TID with meals, insulin pump  Inpatient Diabetes Program Recommendations:    Insulin: Please consider discontinuing insulin pump orders, adding Novolog 0-5 units at bedtime and Novolog 7 units TID with meals for meal coverage if patient eats at least 50% of meals.  NOTE: Noted consult for Diabetes Coordinator. Diabetes Coordinator is not on campus over the weekend but available by pager from 8am to 5pm for questions or concerns. CBGs have been consistently elevated (235-468 mg/dl over the past 24 hours). Noted orders for [facility] and Novolog correction this morning. Due to consistently elevated glucose, will need to manage DM inpatient with SQ insulin regimen.  Thanks, Orlando Penner, RN, MSN, CDCES Diabetes Coordinator Inpatient Diabetes Program [phone] (Team Pager from 8am to 5pm)

## 2022-10-30 ENCOUNTER — Other Ambulatory Visit: Payer: Self-pay | Admitting: *Deleted

## 2022-10-30 DIAGNOSIS — J180 Bronchopneumonia, unspecified organism: Secondary | ICD-10-CM | POA: Diagnosis not present

## 2022-10-30 LAB — BASIC METABOLIC PANEL
Anion gap: 9 (ref 5–15)
BUN: 33 mg/dL — ABNORMAL HIGH (ref 6–20)
CO2: 23 mmol/L (ref 22–32)
Calcium: 8.2 mg/dL — ABNORMAL LOW (ref 8.9–10.3)
Chloride: 105 mmol/L (ref 98–111)
Creatinine, Ser: 1.38 mg/dL — ABNORMAL HIGH (ref 0.61–1.24)
GFR, Estimated: 59 mL/min — ABNORMAL LOW (ref >60)
Glucose, Bld: 164 mg/dL — ABNORMAL HIGH (ref 70–99)
Potassium: 3.8 mmol/L (ref 3.5–5.1)
Sodium: 137 mmol/L (ref 135–145)

## 2022-10-30 LAB — CBC
HCT: 42.1 % (ref 39.0–52.0)
Hemoglobin: 13.7 g/dL (ref 13.0–17.0)
MCH: 29.7 pg (ref 26.0–34.0)
MCHC: 32.5 g/dL (ref 30.0–36.0)
MCV: 91.1 fL (ref 80.0–100.0)
Platelets: 215 10*3/uL (ref 150–400)
RBC: 4.62 MIL/uL (ref 4.22–5.81)
RDW: 14.6 % (ref 11.5–15.5)
WBC: 6.8 10*3/uL (ref 4.0–10.5)
nRBC: 0 % (ref 0.0–0.2)

## 2022-10-30 LAB — GLUCOSE, CAPILLARY
Glucose-Capillary: 171 mg/dL — ABNORMAL HIGH (ref 70–99)
Glucose-Capillary: 156 mg/dL — ABNORMAL HIGH (ref 70–99)
Glucose-Capillary: 163 mg/dL — ABNORMAL HIGH (ref 70–99)
Glucose-Capillary: 259 mg/dL — ABNORMAL HIGH (ref 70–99)

## 2022-10-30 LAB — MAGNESIUM: Magnesium: 2.4 mg/dL (ref 1.7–2.4)

## 2022-10-30 LAB — PHOSPHORUS: Phosphorus: 2.4 mg/dL — ABNORMAL LOW (ref 2.5–4.6)

## 2022-10-30 MED ORDER — FLUTICASONE FUROATE-VILANTEROL 200-25 MCG/ACT IN AEPB: 1.0000 | INHALATION_SPRAY | Freq: Every day | RESPIRATORY_TRACT | 0 refills | Status: DC

## 2022-10-30 MED ORDER — IPRATROPIUM-ALBUTEROL 0.5-2.5 (3) MG/3ML IN SOLN: 3.0000 mL | Freq: Four times a day (QID) | RESPIRATORY_TRACT | Status: DC | PRN

## 2022-10-30 MED ORDER — IPRATROPIUM-ALBUTEROL 0.5-2.5 (3) MG/3ML IN SOLN
3.0000 mL | Freq: Three times a day (TID) | RESPIRATORY_TRACT | Status: DC
  Administered 2022-10-30 (×2): 3 mL via RESPIRATORY_TRACT
  Filled 2022-10-30 (×2): qty 3

## 2022-10-30 MED ORDER — GUAIFENESIN ER 600 MG PO TB12: 600.0000 mg | ORAL_TABLET | Freq: Two times a day (BID) | ORAL | 0 refills | Status: AC

## 2022-10-30 MED ORDER — ALBUTEROL SULFATE HFA 108 (90 BASE) MCG/ACT IN AERS: 2.0000 | INHALATION_SPRAY | Freq: Four times a day (QID) | RESPIRATORY_TRACT | 2 refills | Status: DC | PRN

## 2022-10-30 MED ORDER — LEVOFLOXACIN 750 MG PO TABS: 750.0000 mg | ORAL_TABLET | Freq: Every day | ORAL | 0 refills | Status: AC

## 2022-10-30 NOTE — Discharge Summary (Signed)
[facility] Discharge Summary   Patient: [name] AVW:[id number]  PCP: Kandyce Rud, MD  Date of admission: 10/27/2022   Date of discharge:  10/30/2022     Discharge Diagnoses:  Principal Problem:   Bronchopneumonia Active Problems:   Abdominal pain   Major depressive disorder, recurrent, severe without psychotic features (HCC)   Acquired hypothyroidism   HTN (hypertension)   GERD (gastroesophageal reflux disease)   Type 1 diabetes mellitus (HCC)   Tracheostomy in place [facility])   Acute embolism and thrombosis of unspecified deep veins of unspecified lower extremity (HCC)   Dysphagia   Admitted From: Home Disposition:  Home  Recommendations for Outpatient Follow-up:  Follow-up with PCP in 1 week, repeat BMP after 1 week to check renal functions.  Continue oral hydration. Follow up LABS/TEST: Repeat chest x-ray after 4 weeks for resolution of bronchopneumonia   Diet recommendation: Cardiac and Carb modified diet  Activity: The patient is advised to gradually reintroduce usual activities, as tolerated  Discharge Condition: stable  Code Status: Full code   History of present illness: As per the H and P dictated on admission Hospital Course:   [name] is a 62 y.o. male with PMH of  Diabetes mellitus type 1, hyperlipidemia, hypertension, stroke, suicidal attempt. Abdominal pain/ chest pain midsternal for the past few weeks, sent over from  [facility] [facility] for productive cough, chest congestion, tachypneic, night sweats and chills.  As per patient his symptoms have been going on for about a week chest pain has been going on off and on for about a month.   ED workup:-Tachypneic and tachycardia, BP 140/88 WBC within normal range, procalcitonin 0.13 lactic acid 1.9 within normal range, BNP 38.5 within normal range UDS negative,  CTA chest negative for PE, groundglass opacity bilateral upper lobes and bilateral bronchial wall thickening consistent with  bronchopneumonia CT abd: Cholelithiasis, no any other acute findings Tried hospitalist consulted for admission and further management as below   Assessment and Plan: # Bronchopneumonia  S/p cefepime and vancomycin.  Switched to azithromycin and ceftriaxone. S/p Mucinex twice daily, DuoNeb every 6 hourly scheduled, transition to as needed after improvement. S/p budesonide nebulizer twice daily. RVP negative for any viral infection.  Patient's breathing improved and patient was discharged on Levaquin 750 mg p.o. daily for 5 days.  Follow with PCP and repeat chest x-ray after 4 weeks. # Diabetes mellitus type 1, HbA1c 7.4 well-controlled. Patient was using insulin pump 0.9 units (from Endocrinology note) every hour for his basal needs. His correction is 1 unit for every 4 grams of carbs.  Patient was advised to continue diabetic diet and monitor CBG at home. # AKI, Could be secondary to dehydration.  Creatinine 1.8--- 1.38, improved after IV fluid given for hydration. US renal Unremarkable sonographic appearance of the kidneys.  Bladder scan done, patient is voiding well.  No urinary retention.  Patient was advised to continue oral hydration. # Abdominal pain, mostly patient is complaining of midsternal discomfort. CT A/P positive for cholelithiasis, no any other findings. Recommend to follow with PCP and general surgery as an outpatient Hypertension, continued amlodipine 5 mg p.o. daily, Coreg 25 mg p.o. twice daily, Toprol-XL 25 mg GERD, continue PPI 40 mg p.o. twice daily History of PE, continued Eliquis 5 mg p.o. twice daily Hypothyroid, continued Synthroid Depression, continued Wellbutrin and Effexor home dose Generalized weakness and lethargy, vitamin D level 30.2 wnl, at lower end   Body mass index is 37.14 kg/m.  Nutrition Interventions:  Patient was ambulatory without any assistance. On the day of the discharge the patient's vitals were stable, and no other acute medical condition were  reported by patient. the patient was felt safe to be discharge at Home.  Consultants: None Procedures: NOne  Discharge Exam: General: Appear in no distress, no Rash; Oral Mucosa Clear, moist. Cardiovascular: S1 and S2 Present, no Murmur, Respiratory: normal respiratory effort, Bilateral Air entry present and no Crackles, no wheezes Abdomen: Bowel Sound present, Soft and no tenderness, no hernia Extremities: no Pedal edema, no calf tenderness Neurology: alert and oriented to time, place, and person affect appropriate.  Filed Weights   10/27/22 1648 10/30/22 0458  Weight: 120.2 kg 124.2 kg   Vitals:   10/30/22 0013 10/30/22 0749  BP: (!) 142/77 134/63  Pulse: 65 62  Resp: 16 18  Temp: 98.1 F (36.7 C) 97.9 F (36.6 C)  SpO2: 92% 97%    DISCHARGE MEDICATION: Allergies as of 10/30/2022       Reactions   Buspar [buspirone]    Makes the patient "flip out"   Depakote [valproic Acid]    Causes excessive drowsiness   Pregabalin Other (See Comments)   Gum Bleeding   Clopidogrel Rash   Gabapentin Itching, Rash        Medication List     STOP taking these medications    carvedilol 25 MG tablet Commonly known as: COREG   losartan-hydrochlorothiazide 100-25 MG tablet Commonly known as: HYZAAR       TAKE these medications    albuterol 108 (90 Base) MCG/ACT inhaler Commonly known as: VENTOLIN HFA Inhale 2 puffs into the lungs every 6 (six) hours as needed for wheezing or shortness of breath.   amLODipine 5 MG tablet Commonly known as: NORVASC Take 1 tablet (5 mg total) by mouth daily.   apixaban 5 MG Tabs tablet Commonly known as: ELIQUIS Take 1 tablet (5 mg total) by mouth 2 (two) times daily.   buPROPion 300 MG 24 hr tablet Commonly known as: WELLBUTRIN XL TAKE 1 TABLET(300 MG) BY MOUTH DAILY WITH BREAKFAST   clonazePAM 1 MG tablet Commonly known as: KLONOPIN Take 1 tablet (1 mg total) by mouth daily as needed for anxiety. Please limit use    empagliflozin 25 MG Tabs tablet Commonly known as: JARDIANCE Take 25 mg by mouth daily.   fluticasone furoate-vilanterol 200-25 MCG/ACT Aepb Commonly known as: BREO ELLIPTA Inhale 1 puff into the lungs daily.   guaiFENesin 600 MG 12 hr tablet Commonly known as: MUCINEX Take 1 tablet (600 mg total) by mouth 2 (two) times daily for 7 days.   HumaLOG 100 UNIT/ML injection Generic drug: insulin lispro SMARTSIG:0-120 Unit(s) SUB-Q Daily   insulin aspart 100 UNIT/ML injection Commonly known as: novoLOG INJECT UP TO 120 UNITS UNDER THE SKIN AS DIRECTED DAILY VIA INSULIN PUMP   levofloxacin 750 MG tablet Commonly known as: Levaquin Take 1 tablet (750 mg total) by mouth daily for 5 days.   levothyroxine 75 MCG tablet Commonly known as: SYNTHROID TAKE 1 TABLET BY MOUTH DAILY AT 6 AM   metoprolol succinate 25 MG 24 hr tablet Commonly known as: TOPROL-XL TAKE 1 TABLET(25 MG) BY MOUTH EVERY DAY   mometasone 0.1 % cream Commonly known as: ELOCON Apply to rash on arms and chest twice daily until improved.   ondansetron 4 MG disintegrating tablet Commonly known as: ZOFRAN-ODT Take 4 mg by mouth every 8 (eight) hours as needed for nausea or vomiting.   pantoprazole 40 MG  tablet Commonly known as: PROTONIX TAKE 1 TABLET(40 MG) BY MOUTH TWICE DAILY   rosuvastatin 20 MG tablet Commonly known as: CRESTOR Take 20 mg by mouth at bedtime.   sodium hypochlorite 0.125 % Soln Commonly known as: DAKIN'S 1/4 STRENGTH   venlafaxine XR 75 MG 24 hr capsule Commonly known as: Effexor XR Take 1 capsule (75 mg total) by mouth daily with breakfast. Take along with 150 mg daily - total of 225 mg   venlafaxine XR 150 MG 24 hr capsule Commonly known as: EFFEXOR-XR Take 1 capsule (150 mg total) by mouth daily. Take along with 75 mg daily   zolpidem 10 MG tablet Commonly known as: AMBIEN TAKE 1 TABLET(10 MG) BY MOUTH AT BEDTIME AS NEEDED FOR SLEEP       Allergies  Allergen Reactions    Buspar [Buspirone]     Makes the patient "flip out"   Depakote [Valproic Acid]     Causes excessive drowsiness   Pregabalin Other (See Comments)    Gum Bleeding   Clopidogrel Rash   Gabapentin Itching and Rash   Discharge Instructions     Call MD for:  difficulty breathing, headache or visual disturbances   Complete by: As directed    Call MD for:  extreme fatigue   Complete by: As directed    Call MD for:  persistant dizziness or light-headedness   Complete by: As directed    Call MD for:  persistant nausea and vomiting   Complete by: As directed    Call MD for:  severe uncontrolled pain   Complete by: As directed    Call MD for:  temperature >100.4   Complete by: As directed    Diet - low sodium heart healthy   Complete by: As directed    Discharge instructions   Complete by: As directed    Follow-up with PCP in 1 week, repeat BMP after 1 week to check renal functions.  Continue oral hydration. Repeat chest x-ray after 4 weeks for resolution of bronchopneumonia   Increase activity slowly   Complete by: As directed        The results of significant diagnostics from this hospitalization (including imaging, microbiology, ancillary and laboratory) are listed below for reference.    Significant Diagnostic Studies: US RENAL  Result Date: 10/28/2022 CLINICAL DATA:  [id number] AKI (acute kidney injury) (HCC) [id number] EXAM: RENAL / URINARY TRACT ULTRASOUND COMPLETE COMPARISON:  CT 10/27/2022 FINDINGS: Right Kidney: Renal measurements: 11.6 x 6.9 x 6.2 cm = volume: 261 mL. Echogenicity within normal limits. No mass or hydronephrosis visualized. Left Kidney: Renal measurements: 11.8 x 6.4 x 5.7 cm = volume: 223 mL. Echogenicity within normal limits. No mass or hydronephrosis visualized. Bladder: Appears normal for degree of bladder distention. Other: None. IMPRESSION: Unremarkable sonographic appearance of the kidneys. Electronically Signed   By: Duanne Guess D.O.   On: 10/28/2022 16:51    US ABDOMEN LIMITED RUQ (LIVER/GB)  Result Date: 10/27/2022 CLINICAL DATA:  Abdomen pain EXAM: ULTRASOUND ABDOMEN LIMITED RIGHT UPPER QUADRANT COMPARISON:  None CT 10/27/2022 FINDINGS: Gallbladder: No gallstones or wall thickening visualized. No sonographic Murphy sign noted by sonographer. Common bile duct: Diameter: 5.2 mm Liver: Slightly limited assessment for focal parenchymal abnormality. Portal vein is patent on color Doppler imaging with normal direction of blood flow towards the liver. IMPRESSION: Negative right upper quadrant ultrasound, small stones seen on CT are not well seen sonographically Electronically Signed   By: Jasmine Pang M.D.   On: 10/27/2022  21:26   CT Angio Chest PE W and/or Wo Contrast  Result Date: 10/27/2022 CLINICAL DATA:  Midsternal chest pain, right upper quadrant pain, elevated white blood cell count EXAM: CT ANGIOGRAPHY CHEST CT ABDOMEN AND PELVIS WITH CONTRAST TECHNIQUE: Multidetector CT imaging of the chest was performed using the standard protocol during bolus administration of intravenous contrast. Multiplanar CT image reconstructions and MIPs were obtained to evaluate the vascular anatomy. Multidetector CT imaging of the abdomen and pelvis was performed using the standard protocol during bolus administration of intravenous contrast. RADIATION DOSE REDUCTION: This exam was performed according to the departmental dose-optimization program which includes automated exposure control, adjustment of the mA and/or kV according to patient size and/or use of iterative reconstruction technique. CONTRAST:  OMNIPAQUE IOHEXOL 350 MG/ML SOLN COMPARISON:  10/27/2022, 06/17/2011 FINDINGS: CTA CHEST FINDINGS Cardiovascular: This is a technically adequate evaluation of the pulmonary vasculature. No filling defects or pulmonary emboli. The heart is unremarkable without pericardial effusion. Extensive atherosclerosis of the coronary vasculature. No evidence of thoracic aortic  aneurysm or dissection. Mediastinum/Nodes: No pathologic adenopathy. Tracheostomy. Otherwise the trachea is unremarkable. Segmental air esophagram is nonspecific. No esophageal wall thickening. Lungs/Pleura: Mild emphysema. Scattered tree in bud nodular opacities are seen within the bilateral upper lobes, left greater than right. There is mild bilateral bronchial wall thickening. No effusion or pneumothorax. Central airways are patent. Musculoskeletal: No acute or destructive bony abnormalities. Prior healed right posterolateral ninth through eleventh rib fractures. Reconstructed images demonstrate no additional findings. Review of the MIP images confirms the above findings. CT ABDOMEN and PELVIS FINDINGS Hepatobiliary: Small calcified gallstone without cholecystitis. The liver is unremarkable. No biliary duct dilation. Pancreas: Unremarkable. No pancreatic ductal dilatation or surrounding inflammatory changes. Spleen: Normal in size without focal abnormality. Adrenals/Urinary Tract: Adrenal glands are unremarkable. Kidneys are normal, without renal calculi, focal lesion, or hydronephrosis. Bladder is unremarkable. Stomach/Bowel: No bowel obstruction or ileus. Normal appendix right lower quadrant. No bowel wall thickening or inflammatory change. Vascular/Lymphatic: Aortic atherosclerosis. No enlarged abdominal or pelvic lymph nodes. Reproductive: Prostate is unremarkable. Other: No free fluid or free intraperitoneal gas. Small fat containing umbilical hernia. No bowel herniation. Musculoskeletal: No acute or destructive bony abnormalities. Reconstructed images demonstrate no additional findings. Review of the MIP images confirms the above findings. IMPRESSION: 1. No evidence of pulmonary embolus. 2. Patchy tree in bud ground-glass airspace disease within the bilateral upper lobes, as well as bilateral bronchial wall thickening, consistent with bronchopneumonia. 3. Cholelithiasis without cholecystitis. 4. Otherwise  no acute intra-abdominal or intrapelvic process. 5. Fat containing umbilical hernia. 6. Aortic Atherosclerosis (ICD10-I70.0). Coronary artery atherosclerosis. Electronically Signed   By: Sharlet Salina M.D.   On: 10/27/2022 19:40   CT ABDOMEN PELVIS W CONTRAST  Result Date: 10/27/2022 CLINICAL DATA:  Midsternal chest pain, right upper quadrant pain, elevated white blood cell count EXAM: CT ANGIOGRAPHY CHEST CT ABDOMEN AND PELVIS WITH CONTRAST TECHNIQUE: Multidetector CT imaging of the chest was performed using the standard protocol during bolus administration of intravenous contrast. Multiplanar CT image reconstructions and MIPs were obtained to evaluate the vascular anatomy. Multidetector CT imaging of the abdomen and pelvis was performed using the standard protocol during bolus administration of intravenous contrast. RADIATION DOSE REDUCTION: This exam was performed according to the departmental dose-optimization program which includes automated exposure control, adjustment of the mA and/or kV according to patient size and/or use of iterative reconstruction technique. CONTRAST:  OMNIPAQUE IOHEXOL 350 MG/ML SOLN COMPARISON:  10/27/2022, 06/17/2011 FINDINGS: CTA CHEST FINDINGS Cardiovascular: This  is a technically adequate evaluation of the pulmonary vasculature. No filling defects or pulmonary emboli. The heart is unremarkable without pericardial effusion. Extensive atherosclerosis of the coronary vasculature. No evidence of thoracic aortic aneurysm or dissection. Mediastinum/Nodes: No pathologic adenopathy. Tracheostomy. Otherwise the trachea is unremarkable. Segmental air esophagram is nonspecific. No esophageal wall thickening. Lungs/Pleura: Mild emphysema. Scattered tree in bud nodular opacities are seen within the bilateral upper lobes, left greater than right. There is mild bilateral bronchial wall thickening. No effusion or pneumothorax. Central airways are patent. Musculoskeletal: No acute or  destructive bony abnormalities. Prior healed right posterolateral ninth through eleventh rib fractures. Reconstructed images demonstrate no additional findings. Review of the MIP images confirms the above findings. CT ABDOMEN and PELVIS FINDINGS Hepatobiliary: Small calcified gallstone without cholecystitis. The liver is unremarkable. No biliary duct dilation. Pancreas: Unremarkable. No pancreatic ductal dilatation or surrounding inflammatory changes. Spleen: Normal in size without focal abnormality. Adrenals/Urinary Tract: Adrenal glands are unremarkable. Kidneys are normal, without renal calculi, focal lesion, or hydronephrosis. Bladder is unremarkable. Stomach/Bowel: No bowel obstruction or ileus. Normal appendix right lower quadrant. No bowel wall thickening or inflammatory change. Vascular/Lymphatic: Aortic atherosclerosis. No enlarged abdominal or pelvic lymph nodes. Reproductive: Prostate is unremarkable. Other: No free fluid or free intraperitoneal gas. Small fat containing umbilical hernia. No bowel herniation. Musculoskeletal: No acute or destructive bony abnormalities. Reconstructed images demonstrate no additional findings. Review of the MIP images confirms the above findings. IMPRESSION: 1. No evidence of pulmonary embolus. 2. Patchy tree in bud ground-glass airspace disease within the bilateral upper lobes, as well as bilateral bronchial wall thickening, consistent with bronchopneumonia. 3. Cholelithiasis without cholecystitis. 4. Otherwise no acute intra-abdominal or intrapelvic process. 5. Fat containing umbilical hernia. 6. Aortic Atherosclerosis (ICD10-I70.0). Coronary artery atherosclerosis. Electronically Signed   By: Sharlet Salina M.D.   On: 10/27/2022 19:40   DG Chest 2 View  Result Date: 10/27/2022 CLINICAL DATA:  Chest pain EXAM: CHEST - 2 VIEW COMPARISON:  12/31/2018 x-ray FINDINGS: No consolidation, pneumothorax or effusion. No edema. Normal cardiopericardial silhouette. IMPRESSION: No  acute cardiopulmonary disease. Electronically Signed   By: Karen Kays M.D.   On: 10/27/2022 17:12    Microbiology: Recent Results (from the past 240 hour(s))  Blood culture (routine x 2)     Status: None (Preliminary result)   Collection Time: 10/27/22  6:39 PM   Specimen: BLOOD  Result Value Ref Range Status   Specimen Description BLOOD RIGHT ANTECUBITAL  Final   Special Requests   Final    BOTTLES DRAWN AEROBIC AND ANAEROBIC Blood Culture adequate volume   Culture   Final    NO GROWTH 3 DAYS Performed at [facility], [address], [place], [place] [address]    Report Status PENDING  Incomplete  Blood culture (routine x 2)     Status: None (Preliminary result)   Collection Time: 10/27/22  8:43 PM   Specimen: BLOOD  Result Value Ref Range Status   Specimen Description BLOOD BLOOD LEFT HAND  Final   Special Requests   Final    BOTTLES DRAWN AEROBIC AND ANAEROBIC Blood Culture results may not be optimal due to an excessive volume of blood received in culture bottles   Culture   Final    NO GROWTH 3 DAYS Performed at [facility], [address] [place], [place] [address]    Report Status PENDING  Incomplete  Respiratory (~20 pathogens) panel by PCR     Status: None   Collection Time: 10/28/22  3:15 PM   Specimen: Nasopharyngeal Swab; Respiratory  Result Value Ref Range Status   Adenovirus NOT DETECTED NOT DETECTED Final   Coronavirus 229E NOT DETECTED NOT DETECTED Final    Comment: (NOTE) The Coronavirus on the Respiratory Panel, DOES NOT test for the novel  Coronavirus (2019 nCoV)    Coronavirus HKU1 NOT DETECTED NOT DETECTED Final   Coronavirus NL63 NOT DETECTED NOT DETECTED Final   Coronavirus OC43 NOT DETECTED NOT DETECTED Final   Metapneumovirus NOT DETECTED NOT DETECTED Final   Rhinovirus / Enterovirus NOT DETECTED NOT DETECTED Final   Influenza A NOT DETECTED NOT DETECTED Final   Influenza B NOT DETECTED NOT DETECTED Final   Parainfluenza  Virus 1 NOT DETECTED NOT DETECTED Final   Parainfluenza Virus 2 NOT DETECTED NOT DETECTED Final   Parainfluenza Virus 3 NOT DETECTED NOT DETECTED Final   Parainfluenza Virus 4 NOT DETECTED NOT DETECTED Final   Respiratory Syncytial Virus NOT DETECTED NOT DETECTED Final   Bordetella pertussis NOT DETECTED NOT DETECTED Final   Bordetella Parapertussis NOT DETECTED NOT DETECTED Final   Chlamydophila pneumoniae NOT DETECTED NOT DETECTED Final   Mycoplasma pneumoniae NOT DETECTED NOT DETECTED Final    Comment: Performed at [facility], [address], [place], [place] [address]     Labs: CBC: Recent Labs  Lab 10/28/22 0012 10/28/22 0625 10/29/22 0435 10/30/22 0703  WBC 7.5 16.9* 11.3* 6.8  HGB 15.8 16.2 13.9 13.7  HCT 49.8 52.3* 42.8 42.1  MCV 91.5 93.9 91.5 91.1  PLT 314 308 217 215   Basic Metabolic Panel: Recent Labs  Lab 10/27/22 2051 10/28/22 0012 10/28/22 0625 10/29/22 0435 10/30/22 0703  NA  --   --  135 133* 137  K  --   --  4.1 4.0 3.8  CL  --   --  97* 94* 105  CO2  --   --  24 21* 23  GLUCOSE  --   --  211* 289* 164*  BUN  --   --  25* 34* 33*  CREATININE  --  1.78* 1.80* 1.89* 1.38*  CALCIUM  --   --  8.6* 7.8* 8.2*  MG 2.5*  --   --  2.1 2.4  PHOS  --   --   --  2.6 2.4*   Liver Function Tests: Recent Labs  Lab 10/27/22 1905 10/28/22 0625  AST 17 23  ALT 17 17  ALKPHOS 140* 129*  BILITOT 1.0 0.7  PROT 7.2 6.5  ALBUMIN 3.8 3.3*   Recent Labs  Lab 10/27/22 1905  LIPASE 20   No results for input(s): "AMMONIA" in the last 168 hours. Cardiac Enzymes: No results for input(s): "CKTOTAL", "CKMB", "CKMBINDEX", "TROPONINI" in the last 168 hours. BNP (last 3 results) Recent Labs    10/27/22 1905  BNP 38.5   CBG: Recent Labs  Lab 10/29/22 2107 10/30/22 0222 10/30/22 0750 10/30/22 0820 10/30/22 1146  GLUCAP 146* 171* 156* 163* 259*    Time spent: 35 minutes  Signed:  Gillis Santa  [facility] 10/30/2022 2:34 PM

## 2022-10-30 NOTE — Consult Note (Addendum)
[facility] Customer service manager [facility]) Accountable Care Organization (ACO) [facility] Liaison Note  10/30/2022  [name] 1961-04-23 [id number]  Location: [facility] RN Hospital Liaison screened the patient remotely at [facility].  Insurance: [employer]   [name] is a 62 y.o. male who is a Primary Care Patient of Kandyce Rud, MD [facility]). The patient was screened for  readmission hospitalization with noted medium risk score for unplanned readmission risk with 1 IP in 6 months.  The patient was assessed for potential [facility]) Care Management service needs for post hospital transition for care coordination. Review of patient's electronic medical record reveals patient was admitted with bronchopneumonia. Liaison attempted outreach call to pt's mobile device based upon his discharge today (unsuccessful and unable to leave a HIPAA voice message).  Pt is being followed by [facility] for care coordination services noted in [facility].   Plan: [facility] Liaison will continue to follow progress and disposition to asess for post hospital community care coordination/management needs.  Referral request for community care coordination: pending disposition.   [facility] Care Management/Population Health does not replace or interfere with any arrangements made by the Inpatient Transition of Care team.   For questions contact:   Elliot Cousin, RN, BSN [facility] Liaison [place]   [facility]  Population Health Office Hours MTWF  8:00 am-6:00 pm Off on Thursday [phone] mobile [phone] [Office toll free line][facility] Office Hours are M-F 8:30 - 5 pm 24 hour nurse advise line [phone] Concierge  [name]

## 2022-10-30 NOTE — Progress Notes (Signed)
AVS instructions and medication reviewed with pt. All questions answered prior to discharge.

## 2022-10-30 NOTE — Plan of Care (Signed)

## 2022-10-30 NOTE — Inpatient Diabetes Management (Signed)
Inpatient Diabetes Program Recommendations  AACE/ADA: New Consensus Statement on Inpatient Glycemic Control   Target Ranges:  Prepandial:   less than 140 mg/dL      Peak postprandial:   less than 180 mg/dL (1-2 hours)      Critically ill patients:  140 - 180 mg/dL    Latest Reference Range & Units 10/29/22 07:40 10/29/22 11:32 10/29/22 16:13 10/29/22 21:07 10/30/22 02:22 10/30/22 07:50 10/30/22 08:20  Glucose-Capillary 70 - 99 mg/dL 098 (H) 119 (H) 147 (H) 146 (H) 171 (H) 156 (H) 163 (H)   Review of Glycemic Control  Diabetes history: Type 1 DM (does NOT make any insulin; requires basal, correction, and carb coverage insulin) Outpatient Diabetes medications: T-Slim insulin pump with Humalog (0.9 unit/hour; 1:CR 1:4 grams, I:SF 1:25 mg/dl), Jardiance 25 mg daily, Dexcom G6 CGM Current orders for Inpatient glycemic control: Insulin Pump AC&HS and 2am  NOTE: Spoke with patient at bedside. Patient reports that the doctor wanted him to remove his insulin pump yesterday but he refused.  Explained that with his glucose being 360 mg/dl yesterday morning, it appeared his DM was not well controlled on the insulin pump so it was recommended to use SQ insulin to try to get glucose down.  Patient states he understood why and he worked yesterday to get it down. Patient states he was currently using Control IQ (auto mode) on his insulin pump. He states that he changed out his insulin pump infusion site, tubing, reservoir yesterday. Patient states he is doing boluses more often than usual to get his glucose down. Discussed that with infection, glucose is likely higher than usual and he may need more frequent corrections to keep glucose well controlled. Patient reports that his current glucose was 152 mg/dl on his Dexcom at time of discussion. Stressed importance of DM control to help clear infection and decrease risk of complications. Patient states he has all needed pump supplies at bedside. Will follow  along.  Thanks, Orlando Penner, RN, MSN, CDCES Diabetes Coordinator Inpatient Diabetes Program [phone] (Team Pager from 8am to 5pm)

## 2022-10-30 NOTE — Care Management Important Message (Signed)
Important Message  Patient Details  Name: [name] MRN: [id number] Date of Birth: 1960/06/30   Medicare Important Message Given:  Yes  I reviewed the form with the patient and obtained his signature. I will forward to Health Information Management to be scanned into the patients Medical Record.   Olegario Messier A Esra Frankowski 10/30/2022, 2:47 PM

## 2022-10-30 NOTE — Plan of Care (Signed)

## 2022-10-31 DIAGNOSIS — R809 Proteinuria, unspecified: Secondary | ICD-10-CM | POA: Diagnosis not present

## 2022-10-31 DIAGNOSIS — E1029 Type 1 diabetes mellitus with other diabetic kidney complication: Secondary | ICD-10-CM | POA: Diagnosis not present

## 2022-10-31 LAB — CULTURE, BLOOD (ROUTINE X 2): Culture: NO GROWTH

## 2022-11-01 DIAGNOSIS — Z430 Encounter for attention to tracheostomy: Secondary | ICD-10-CM | POA: Diagnosis not present

## 2022-11-01 LAB — CULTURE, BLOOD (ROUTINE X 2): Culture: NO GROWTH

## 2022-11-06 DIAGNOSIS — Z8701 Personal history of pneumonia (recurrent): Secondary | ICD-10-CM | POA: Diagnosis not present

## 2022-11-06 DIAGNOSIS — R110 Nausea: Secondary | ICD-10-CM | POA: Diagnosis not present

## 2022-11-07 DIAGNOSIS — I10 Essential (primary) hypertension: Secondary | ICD-10-CM | POA: Diagnosis not present

## 2022-11-07 DIAGNOSIS — N1832 Chronic kidney disease, stage 3b: Secondary | ICD-10-CM | POA: Diagnosis not present

## 2022-11-07 DIAGNOSIS — R110 Nausea: Secondary | ICD-10-CM | POA: Diagnosis not present

## 2022-11-07 DIAGNOSIS — Z79899 Other long term (current) drug therapy: Secondary | ICD-10-CM | POA: Diagnosis not present

## 2022-11-07 DIAGNOSIS — J189 Pneumonia, unspecified organism: Secondary | ICD-10-CM | POA: Diagnosis not present

## 2022-11-07 DIAGNOSIS — E1022 Type 1 diabetes mellitus with diabetic chronic kidney disease: Secondary | ICD-10-CM | POA: Diagnosis not present

## 2022-11-14 ENCOUNTER — Other Ambulatory Visit: Payer: Self-pay | Admitting: Psychiatry

## 2022-11-14 DIAGNOSIS — R6883 Chills (without fever): Secondary | ICD-10-CM | POA: Diagnosis not present

## 2022-11-14 DIAGNOSIS — R110 Nausea: Secondary | ICD-10-CM | POA: Diagnosis not present

## 2022-11-14 DIAGNOSIS — R630 Anorexia: Secondary | ICD-10-CM | POA: Diagnosis not present

## 2022-11-14 DIAGNOSIS — F5101 Primary insomnia: Secondary | ICD-10-CM

## 2022-11-21 ENCOUNTER — Other Ambulatory Visit: Payer: Self-pay | Admitting: Psychiatry

## 2022-11-21 DIAGNOSIS — F411 Generalized anxiety disorder: Secondary | ICD-10-CM

## 2022-11-28 DIAGNOSIS — Z888 Allergy status to other drugs, medicaments and biological substances status: Secondary | ICD-10-CM | POA: Diagnosis not present

## 2022-11-28 DIAGNOSIS — Z9002 Acquired absence of larynx: Secondary | ICD-10-CM | POA: Diagnosis not present

## 2022-11-28 DIAGNOSIS — E785 Hyperlipidemia, unspecified: Secondary | ICD-10-CM | POA: Diagnosis not present

## 2022-11-28 DIAGNOSIS — Z79890 Hormone replacement therapy (postmenopausal): Secondary | ICD-10-CM | POA: Diagnosis not present

## 2022-11-28 DIAGNOSIS — E039 Hypothyroidism, unspecified: Secondary | ICD-10-CM | POA: Diagnosis not present

## 2022-11-28 DIAGNOSIS — I10 Essential (primary) hypertension: Secondary | ICD-10-CM | POA: Diagnosis not present

## 2022-11-28 DIAGNOSIS — Z79899 Other long term (current) drug therapy: Secondary | ICD-10-CM | POA: Diagnosis not present

## 2022-11-28 DIAGNOSIS — Z794 Long term (current) use of insulin: Secondary | ICD-10-CM | POA: Diagnosis not present

## 2022-11-28 DIAGNOSIS — Z963 Presence of artificial larynx: Secondary | ICD-10-CM | POA: Diagnosis not present

## 2022-11-28 DIAGNOSIS — Z8521 Personal history of malignant neoplasm of larynx: Secondary | ICD-10-CM | POA: Diagnosis not present

## 2022-11-28 DIAGNOSIS — E119 Type 2 diabetes mellitus without complications: Secondary | ICD-10-CM | POA: Diagnosis not present

## 2022-11-28 DIAGNOSIS — Z7982 Long term (current) use of aspirin: Secondary | ICD-10-CM | POA: Diagnosis not present

## 2022-11-28 DIAGNOSIS — Z87891 Personal history of nicotine dependence: Secondary | ICD-10-CM | POA: Diagnosis not present

## 2022-11-28 DIAGNOSIS — Z448 Encounter for fitting and adjustment of other external prosthetic devices: Secondary | ICD-10-CM | POA: Diagnosis not present

## 2022-11-28 DIAGNOSIS — I69351 Hemiplegia and hemiparesis following cerebral infarction affecting right dominant side: Secondary | ICD-10-CM | POA: Diagnosis not present

## 2022-11-29 DIAGNOSIS — M19012 Primary osteoarthritis, left shoulder: Secondary | ICD-10-CM | POA: Diagnosis not present

## 2022-11-29 DIAGNOSIS — M25512 Pain in left shoulder: Secondary | ICD-10-CM | POA: Diagnosis not present

## 2022-11-29 DIAGNOSIS — J188 Other pneumonia, unspecified organism: Secondary | ICD-10-CM | POA: Diagnosis not present

## 2022-11-29 DIAGNOSIS — R0989 Other specified symptoms and signs involving the circulatory and respiratory systems: Secondary | ICD-10-CM | POA: Diagnosis not present

## 2022-12-01 DIAGNOSIS — Z430 Encounter for attention to tracheostomy: Secondary | ICD-10-CM | POA: Diagnosis not present

## 2022-12-04 ENCOUNTER — Telehealth (INDEPENDENT_AMBULATORY_CARE_PROVIDER_SITE_OTHER): Payer: HMO | Admitting: Psychiatry

## 2022-12-04 DIAGNOSIS — F331 Major depressive disorder, recurrent, moderate: Secondary | ICD-10-CM

## 2022-12-04 NOTE — Progress Notes (Signed)
No response to text or video invite Attempted to contact the phone number provided, unable to reach patient, phone number invalid.

## 2022-12-11 ENCOUNTER — Telehealth: Payer: Self-pay

## 2022-12-11 NOTE — Telephone Encounter (Signed)
pt states that he was in the hospital with pneumonia. he states that him and his pcp dr. Larwance Sachs feel that the venlafaxine is causing side effects. pt states that dr. Larwance Sachs told him to do 150mg  for one week which he did last week and then this week do 75mg  and then consult with dr. Elna Breslow about what to do next.  Pt was last seen on 5-23 next appt 8-22  Pt states that he had bleeding and swollen gums, and could not concentrate and he was shaking all over.

## 2022-12-11 NOTE — Telephone Encounter (Signed)
notified pt that since dr. Elna Breslow was out of the office this until thursday a message was sent to dr. Tenny Craw who stated that dr. Elna Breslow needed to handle when she gets back into the office.

## 2022-12-11 NOTE — Telephone Encounter (Signed)
Send this to Dr Elna Breslow on her return

## 2022-12-13 DIAGNOSIS — M25512 Pain in left shoulder: Secondary | ICD-10-CM | POA: Diagnosis not present

## 2022-12-13 DIAGNOSIS — I152 Hypertension secondary to endocrine disorders: Secondary | ICD-10-CM | POA: Diagnosis not present

## 2022-12-13 DIAGNOSIS — E1069 Type 1 diabetes mellitus with other specified complication: Secondary | ICD-10-CM | POA: Diagnosis not present

## 2022-12-13 DIAGNOSIS — E1159 Type 2 diabetes mellitus with other circulatory complications: Secondary | ICD-10-CM | POA: Diagnosis not present

## 2022-12-13 DIAGNOSIS — E039 Hypothyroidism, unspecified: Secondary | ICD-10-CM | POA: Diagnosis not present

## 2022-12-13 DIAGNOSIS — R809 Proteinuria, unspecified: Secondary | ICD-10-CM | POA: Diagnosis not present

## 2022-12-13 DIAGNOSIS — E1029 Type 1 diabetes mellitus with other diabetic kidney complication: Secondary | ICD-10-CM | POA: Diagnosis not present

## 2022-12-13 DIAGNOSIS — E785 Hyperlipidemia, unspecified: Secondary | ICD-10-CM | POA: Diagnosis not present

## 2022-12-19 ENCOUNTER — Other Ambulatory Visit: Payer: Self-pay | Admitting: Psychiatry

## 2022-12-19 DIAGNOSIS — Z634 Disappearance and death of family member: Secondary | ICD-10-CM

## 2022-12-19 DIAGNOSIS — F411 Generalized anxiety disorder: Secondary | ICD-10-CM

## 2022-12-19 DIAGNOSIS — F331 Major depressive disorder, recurrent, moderate: Secondary | ICD-10-CM

## 2022-12-20 ENCOUNTER — Telehealth: Payer: Self-pay | Admitting: Psychiatry

## 2022-12-20 ENCOUNTER — Telehealth: Payer: Self-pay

## 2022-12-20 DIAGNOSIS — F331 Major depressive disorder, recurrent, moderate: Secondary | ICD-10-CM

## 2022-12-20 DIAGNOSIS — Z1331 Encounter for screening for depression: Secondary | ICD-10-CM | POA: Diagnosis not present

## 2022-12-20 DIAGNOSIS — F32A Depression, unspecified: Secondary | ICD-10-CM | POA: Diagnosis not present

## 2022-12-20 MED ORDER — LURASIDONE HCL 20 MG PO TABS
20.0000 mg | ORAL_TABLET | Freq: Every day | ORAL | 0 refills | Status: DC
Start: 2022-12-20 — End: 2022-12-28

## 2022-12-20 NOTE — Telephone Encounter (Signed)
i had left a message asking her to return call to get more details because all she said was that she was concerned about him.

## 2022-12-20 NOTE — Telephone Encounter (Signed)
I spoke to mother-Ms. Elvera Lennox, mother appreciative of  recent medication changes.  She is willing to take patient for his TMS therapy if he gets it scheduled.  She also believes he will benefit from TMS since he responded to that the last couple of times he has had it.  She believes he has had side effects to venlafaxine-excessive sweatiness and that is why his medication dosage was reduced. She was able to pick up Latuda from the pharmacy. She agrees to bring patient for his in person visit with this provider next week.  Crisis plan discussed with patient's mother, if in a crisis to take him to the nearest emergency department.  She could also take out a petition if there is a safety issue and if he refuses care.

## 2022-12-20 NOTE — Telephone Encounter (Signed)
Mother stops in today stating she left voice message on nurse line 12/19/22 for someone to call her. She is concerned about Lori's mental health. He is in a deep state of depression, does not want to go to ED or [facility] at this time. Very lethargic, does not want to eat, stays in bed, afraid/scared. Does not want to harm himself. Please call to advise

## 2022-12-20 NOTE — Telephone Encounter (Signed)
Attempted to contact [facility]- Ms.Revonda Standard NP -had to leave a message.  Contacted patient, spoke to Mr. Stfleur, he is currently not suicidal however does report depression has worsened, excessive sleepiness, feels unmotivated, feels he has side effects to venlafaxine.  He is currently taking only a 75 mg daily.  Patient has a previous history of responding to transcranial magnetic stimulation in the past.  I have referred this patient to TMS with [facility] [place].  I have also communicated with Ms. Morrie Sheldon, [employer] coordinator.  We will start Latuda since currently he is not on the olanzapine.  Latuda 20 mg p.o. daily with the meals sent to pharmacy.  I have communicated with staff here to change patient's appointment next week to in person.  Patient also on a wait list for sooner appointment if needed.

## 2022-12-20 NOTE — Telephone Encounter (Signed)
she like to speak to you she states that [name] is having a mental health crisis and she needs to speak with you as soon as possible. [phone]

## 2022-12-21 ENCOUNTER — Telehealth (HOSPITAL_COMMUNITY): Payer: Self-pay

## 2022-12-21 NOTE — Telephone Encounter (Signed)
TMS C: Received incoming email with referral for patient from Dr. Elna Breslow on 12/20/2022.  Coordinator sent outgoing emailed response to Dr. Elna Breslow. Coordinator alerted Dr.Eappen that after reviewing pt's chart and speaking with TMS provider (Dr. Renaldo Fiddler), team determined patient is ineligible for tx d/t history of strokes and brain disease. Coordinator acknowledged patient qualified for TMS in the past, but emphasized that strict ethical and safety risks needed to be abided by. Coordinator agreed to review pt's chart again to evaluate all criterion, but likelihood of re-treatment would be low. Coordinator happy to speak with pt's mother once an official DPR or ROI has been signed & submitted into system. Coordinator concluded email by offering assistance in potentially referring pt to PHP or MH-IOP program. Will send another email by EOD (12/21/22) with final determination.

## 2022-12-21 NOTE — Telephone Encounter (Signed)
TMS C: After conducting thorough review of pt's chart and speaking with TMS provider [name]), Coordinator sent outgoing email to Dr. Elna Holder with the final determination.   Referral/request for TMS treatment is denied due to high safety risks via contraindications of stroke and brain damage history, along with lack of supporting evidence that patient showed improvement after receiving TMS.   Lack of supporting evidence was shown via encounter with Dr. Elna Holder on 04/28/20: patient reported he "still does not feel any better in regard to symptoms and may be feeling worse than he did before TMS."   Coordinator offered recommendations of referring patient to [facility] for Esketamine tx, (since treatment is highly monitored and less likely to induce opioid relapse), along with referral to [facility] PHP/MH-IOP programs for additional support.

## 2022-12-25 DIAGNOSIS — M7502 Adhesive capsulitis of left shoulder: Secondary | ICD-10-CM | POA: Diagnosis not present

## 2022-12-28 ENCOUNTER — Ambulatory Visit (INDEPENDENT_AMBULATORY_CARE_PROVIDER_SITE_OTHER): Payer: HMO | Admitting: Psychiatry

## 2022-12-28 ENCOUNTER — Encounter: Payer: Self-pay | Admitting: Psychiatry

## 2022-12-28 VITALS — BP 192/81 | HR 93 | Temp 97.4°F | Ht 72.0 in | Wt 246.6 lb

## 2022-12-28 DIAGNOSIS — Z634 Disappearance and death of family member: Secondary | ICD-10-CM | POA: Diagnosis not present

## 2022-12-28 DIAGNOSIS — F411 Generalized anxiety disorder: Secondary | ICD-10-CM | POA: Diagnosis not present

## 2022-12-28 DIAGNOSIS — F332 Major depressive disorder, recurrent severe without psychotic features: Secondary | ICD-10-CM

## 2022-12-28 DIAGNOSIS — F5101 Primary insomnia: Secondary | ICD-10-CM

## 2022-12-28 MED ORDER — LURASIDONE HCL 40 MG PO TABS
40.0000 mg | ORAL_TABLET | Freq: Every day | ORAL | 1 refills | Status: DC
Start: 2022-12-28 — End: 2023-02-20

## 2022-12-28 MED ORDER — VENLAFAXINE HCL ER 37.5 MG PO CP24
37.5000 mg | ORAL_CAPSULE | Freq: Every day | ORAL | 1 refills | Status: DC
Start: 2022-12-28 — End: 2023-01-19

## 2022-12-28 NOTE — Patient Instructions (Addendum)
[email or web address]  www.[email or web address]  [email or web address] [facility] [address], [place], [place] [address]   [phone]  [facility] [email or web address] [address] [place], [place] [address]  [phone]  [facility] [email or web address] [address], [place], [place] [address]  [phone]   [facility] - [phone]  [facility] - [phone]  [facility] - [phone] counseling [phone]  [facility] [phone]   [email or web address] this clinician can offer telehealth and has a sliding scale option  [email or web address] this group also offers sliding scale rates and is based out of [place]  Dr. Liborio Nixon with the [facility] specializes in divorce  [employer] [facility] has interns who offer sliding scale rates and some of the full time clinicians do, as well. You complete their contact form on their website and the referrals coordinator will help to get connected to someone   Medicaid below :  [facility] [address] [place], [place] [address]  [phone]    [employer] [address] [place], [place] [address]  [phone]    [facility] [address] [place], [place] [address]  [phone]     Lurasidone Tablets What is this medication? LURASIDONE (loo RAS i done) treats schizophrenia and bipolar depression. It works by balancing the levels of dopamine and serotonin in your brain, substances that help regulate mood, behaviors, and thoughts. It belongs to a group of medications called antipsychotics. Antipsychotic medications can be used to treat several kinds of mental health  conditions. This medicine may be used for other purposes; ask your health care provider or pharmacist if you have questions. COMMON BRAND NAME(S): Latuda What should I tell my care team before I take this medication? They need to know if you have any of these conditions: Dementia Diabetes Difficulty swallowing Have trouble controlling your muscles Heart disease High cholesterol History of breast cancer History of high prolactin levels History of stroke Kidney disease Liver disease Low blood counts, like low white cell, platelet, or red cell counts Low blood pressure Parkinson's disease Seizures Suicidal thoughts, plans, or attempt; a previous suicide attempt by you or a family member An unusual or allergic reaction to lurasidone, other medications, foods, dyes, or preservatives Pregnant or trying to get pregnant Breast-feeding How should I use this medication? Take this medication by mouth with a glass of water. Follow the directions on the prescription label. Take this medication with food. Take your medication at regular intervals. Do not take it more often than directed. Do not stop taking except on your care team's advice. A special MedGuide will be given to you by the pharmacist with each prescription and refill. Be sure to read this information carefully each time. Talk to your care team about the use of this medication in children. While this medication may be prescribed for children as young as 10 years for selected conditions, precautions do apply. Overdosage: If you think you have taken too much of this medicine contact a poison control center or emergency room at once. NOTE: This medicine is only for you. Do not share this medicine with others. What if I miss a dose? If you miss a dose, take it as soon as you can. If it is almost time for your next dose, take only that dose. Do  not take double or extra doses. What may interact with this medication? Do not take this  medication with any of the following: Adagrasib Avasimibe Carbamazepine Certain medications for fungal infections, such as ketoconazole, voriconazole Clarithromycin Metoclopramide Mibefradil Phenytoin Rifampin Ritonavir St. John's wort This medication may also interact with the following: Antihistamines for allergy, cough, and cold Bosentan Certain medications for anxiety or sleep Certain medications for depression, such as amitriptyline, fluoxetine, sertraline Certain medications for HIV or hepatitis Erythromycin Fluconazole General anesthetics, such as halothane, isoflurane, methoxyflurane, propofol Grapefruit juice Levodopa or other medications for Parkinson disease Medications for blood pressure Medications for seizures Medications that relax muscles for surgery Modafinil Nafcillin Opioid medications for pain Phenothiazines, such as chlorpromazine, prochlorperazine, thioridazine This list may not describe all possible interactions. Give your health care provider a list of all the medicines, herbs, non-prescription drugs, or dietary supplements you use. Also tell them if you smoke, drink alcohol, or use illegal drugs. Some items may interact with your medicine. What should I watch for while using this medication? Visit your care team for regular checks on your progress. Tell your care team if symptoms do not start to get better or if they get worse. Do not stop taking except on your care team's advice. You may develop a severe reaction. Your care team will tell you how much medication to take. Patients and their families should watch out for new or worsening depression or thoughts of suicide. Also watch out for sudden changes in feelings such as feeling anxious, agitated, panicky, irritable, hostile, aggressive, impulsive, severely restless, overly excited and hyperactive, or not being able to sleep. If this happens, especially at the beginning of treatment or after a change in  dose, call your care team. This medication may increase blood sugar. Ask your care team if changes in diet or medications are needed if you have diabetes. You may get dizzy or drowsy. Do not drive, use machinery, or do anything that needs mental alertness until you know how this medication affects you. Do not stand or sit up quickly, especially if you are an older patient. This reduces the risk of dizzy or fainting spells. Alcohol may interfere with the effect of this medication. Avoid alcoholic drinks. This medication may cause dry eyes and blurred vision. If you wear contact lenses you may feel some discomfort. Lubricating drops may help. See your eye care team if the problem does not go away or is severe. This medication can cause problems with controlling your body temperature. It can lower the response of your body to cold temperatures. If possible, stay indoors during cold weather. If you must go outdoors, wear warm clothes. It can also lower the response of your body to heat. Do not overheat. Do not over-exercise. Stay out of the sun when possible. If you must be in the sun, wear cool clothing. Drink plenty of water. If you have trouble controlling your body temperature, call your care team right away. What side effects may I notice from receiving this medication? Side effects that you should report to your care team as soon as possible: Allergic reactions--skin rash, itching, hives, swelling of the face, lips, tongue, or throat High blood sugar (hyperglycemia)--increased thirst or amount of urine, unusual weakness or fatigue, blurry vision High fever, stiff muscles, increased sweating, fast or irregular heartbeat, and confusion, which may be signs of neuroleptic malignant syndrome High prolactin level--unexpected breast tissue growth, discharge from the nipple, change in sex drive or performance, irregular menstrual cycle Infection--fever,  chills, cough, or sore throat Low blood  pressure--dizziness, feeling faint or lightheaded, blurry vision Pain or trouble swallowing Seizures Stroke--sudden numbness or weakness of the face, arm, or leg, trouble speaking, confusion, trouble walking, loss of balance or coordination, dizziness, severe headache, change in vision Thoughts of suicide or self-harm, worsening mood, feelings of depression Uncontrolled and repetitive body movements, muscle stiffness or spasms, tremors or shaking, loss of balance or coordination, restlessness, shuffling walk, which may be signs of extrapyramidal symptoms (EPS) Side effects that usually do not require medical attention (report to your care team if they continue or are bothersome): Drowsiness Nausea Restlessness Runny or stuffy nose Trouble sleeping Weight gain This list may not describe all possible side effects. Call your doctor for medical advice about side effects. You may report side effects to FDA at 1-800-FDA-1088. Where should I keep my medication? Keep out of the reach of children. Store at room temperature between 15 and 30 degrees C (59 and 86 degrees F). Throw away any unused medication after the expiration date. NOTE: This sheet is a summary. It may not cover all possible information. If you have questions about this medicine, talk to your doctor, pharmacist, or health care provider.  2024 Elsevier/Gold Standard (2021-05-05 00:00:00)

## 2022-12-28 NOTE — Progress Notes (Signed)
BH MD OP Progress Note  12/28/2022 1:54 PM [name]  MRN:  [id number]  Chief Complaint:  Chief Complaint  Patient presents with   Follow-up   Depression   Anxiety   memory changes   Medication Refill   HPI: [name] is a 62 year old Caucasian male, widowed, lives in [place], has a history of MDD, GAD, primary insomnia, bereavement, history of tracheal stenosis status post multiple reintubation, respiratory failure due to opiate overdose, history of CVA with right-sided hemiparesis, diabetes mellitus status post BKA unilateral, left, stage IIIb chronic kidney disease, hypertension, NSTEMI, hypothyroidism, chronic pain was evaluated in office today.  Patient as well as mother presented for the evaluation.  Patient today appeared to be depressed.  Reports he does not feel motivated to do anything.  Reports he likes to stay to himself and does not go out much.  Patient reports he has started taking the Latuda however has not seen a difference yet.  He would like to come off of the venlafaxine since he believes it is not beneficial and likely contributing to his worsening depression symptoms.  He also reports it is causing him night sweats and he hence does not want to be on it.  Patient reports he had forgotten he had Ambien available and hence was not taking it.  However he started using it a couple of nights ago and sleep is better.  He continues to use the clonazepam on a daily basis.  That does help.  Patient reports he is currently not in therapy.  He does not believe that therapy sessions were beneficial.  He had someone from church coming to talk to him previously.  However that person is not coming in anymore.  According to collateral information obtained from mother patient has been decompensating since the past several months.  She kept encouraging him to follow up with this writer however when he did not do it that is when she decided to call the office.  Mother reports  she has been checking in on him on a daily basis.  He is socially withdrawn and appears to be sad.  Mother and patient agreeable to pursuing TMS.  Also discussed ketamine nasal spray with them.  Agrees to discuss with [facility] consult team.  Patient currently denies any suicidality, homicidality or perceptual disturbances.  Patient does report memory changes since the past several weeks.  He reports he struggles with short-term memory problems.  Patient today in session appeared to be alert, oriented to person place time situation.  3 word memory immediate 3 out of 3, after 5 minutes 2 out of 3.  Patient was able to do serial sevens with some support.  Attention and concentration seems to be limited.  Likely due to his depression symptoms.    Visit Diagnosis:    ICD-10-CM   1. Severe episode of recurrent major depressive disorder, without psychotic features (HCC)  F33.2 lurasidone (LATUDA) 40 MG TABS tablet    venlafaxine XR (EFFEXOR XR) 37.5 MG 24 hr capsule    2. GAD (generalized anxiety disorder)  F41.1 venlafaxine XR (EFFEXOR XR) 37.5 MG 24 hr capsule    3. Primary insomnia  F51.01     4. Bereavement  Z63.4       Past Psychiatric History: I have reviewed past psychiatric history from progress note on 01/15/2019.  Past trials of fluoxetine, Rexulti, fluvoxamine, Celexa, Lexapro, Wellbutrin, Zyprexa, Seroquel, Depakote Xanax, Abilify.  Completed TMS-04/05/2020.  Patient also had TMS session prior  to that.  History of ECT-did not tolerate it.  Multiple inpatient behavioral health admissions in 2020.  Possible remote history of suicide attempts in 2014, however reports it was accidental and he did not intend to kill himself.  Past Medical History:  Past Medical History:  Diagnosis Date   Depression    Diabetes (HCC)    Insulin Pump   Diabetes mellitus type I (HCC)    Diabetes mellitus without complication (HCC)    GERD (gastroesophageal reflux disease)    H/O laryngectomy    Heel  bone fracture    Hyperlipidemia    Hypertension    Radicular pain of right lower extremity    Stroke (HCC)    Suicide attempt (HCC) 2014   damaged larynx - tracheostomy   Thyroid disease     Past Surgical History:  Procedure Laterality Date   COLONOSCOPY WITH PROPOFOL N/A 05/15/2018   Procedure: COLONOSCOPY WITH PROPOFOL;  Surgeon: [name], Boykin Nearing, MD;  Location: [facility] ENDOSCOPY;  Service: Gastroenterology;  Laterality: N/A;   ESOPHAGOGASTRODUODENOSCOPY N/A 05/15/2018   Procedure: ESOPHAGOGASTRODUODENOSCOPY (EGD);  Surgeon: [name], Boykin Nearing, MD;  Location: [facility] ENDOSCOPY;  Service: Gastroenterology;  Laterality: N/A;   FRACTURE SURGERY     Heel bone reconstruction Left    HERNIA REPAIR  02/2011   Umbilical hernia repair    LARYNGECTOMY     LOWER EXTREMITY ANGIOGRAPHY Left 01/31/2021   Procedure: LOWER EXTREMITY ANGIOGRAPHY;  Surgeon: Annice Needy, MD;  Location: [facility] INVASIVE CV LAB;  Service: Cardiovascular;  Laterality: Left;   LOWER EXTREMITY ANGIOGRAPHY Right 02/14/2021   Procedure: LOWER EXTREMITY ANGIOGRAPHY;  Surgeon: Annice Needy, MD;  Location: [facility] INVASIVE CV LAB;  Service: Cardiovascular;  Laterality: Right;   NECK SURGERY     fusion   SPINE SURGERY     TRACHEOSTOMY  2014   from SI attempt    Family Psychiatric History: I have reviewed family psychiatric history from progress note on 01/15/2019.  Family History:  Family History  Problem Relation Age of Onset   Osteoporosis Mother    Diabetes Mother    Hypertension Father    Mental illness Neg Hx     Social History: I have reviewed social history from progress note on 01/15/2019. Social History   Socioeconomic History   Marital status: Widowed    Spouse name: [name]   Number of children: Not on file   Years of education: Not on file   Highest education level: Not on file  Occupational History   Not on file  Tobacco Use   Smoking status: Former    Current packs/day: 0.00    Types: Cigarettes    Quit date:  04/09/2013    Years since quitting: 9.7   Smokeless tobacco: Never  Vaping Use   Vaping status: Never Used  Substance and Sexual Activity   Alcohol use: Yes    Alcohol/week: 2.0 standard drinks of alcohol    Types: 2 Shots of liquor per week    Comment: rare   Drug use: No    Comment: Pt denied; UDS not available   Sexual activity: Yes    Partners: Female    Birth control/protection: Condom  Other Topics Concern   Not on file  Social History Narrative   Not on file   Social Determinants of Health   Financial Resource Strain: Medium Risk (09/12/2022)   [facility], [facility]   Overall Financial Resource Strain (CARDIA)    Difficulty  of Paying Living Expenses: Somewhat hard  Food Insecurity: No Food Insecurity (10/28/2022)   Hunger Vital Sign    Worried About Running Out of Food in the Last Year: Never true    Ran Out of Food in the Last Year: Never true  Recent Concern: Food Insecurity - Food Insecurity Present (09/12/2022)   Received from [facility], [facility]   Hunger Vital Sign    Worried About Running Out of Food in the Last Year: Sometimes true    Ran Out of Food in the Last Year: Never true  Transportation Needs: No Transportation Needs (10/28/2022)   PRAPARE - Administrator, Civil Service (Medical): No    Lack of Transportation (Non-Medical): No  Physical Activity: Not on file  Stress: Not on file  Social Connections: Not on file    Allergies:  Allergies  Allergen Reactions   Buspar [Buspirone]     Makes the patient "flip out"   Depakote [Valproic Acid]     Causes excessive drowsiness   Pregabalin Other (See Comments)    Gum Bleeding   Clopidogrel Rash   Gabapentin Itching and Rash    Metabolic Disorder Labs: Lab Results  Component Value Date   HGBA1C 7.4 (H) 10/28/2022   MPG 165.68 10/28/2022   MPG 177 02/14/2021   No results found for:  "PROLACTIN" Lab Results  Component Value Date   CHOL 161 09/15/2016   TRIG 190 (H) 09/15/2016   HDL 41 09/15/2016   CHOLHDL 3.9 09/15/2016   VLDL 38 09/15/2016   LDLCALC 82 09/15/2016   Lab Results  Component Value Date   TSH 1.952 09/15/2016    Therapeutic Level Labs: No results found for: "LITHIUM" Lab Results  Component Value Date   VALPROATE 19 (L) 09/12/2016   No results found for: "CBMZ"  Current Medications: Current Outpatient Medications  Medication Sig Dispense Refill   albuterol (VENTOLIN HFA) 108 (90 Base) MCG/ACT inhaler Inhale 2 puffs into the lungs every 6 (six) hours as needed for wheezing or shortness of breath. 8 g 2   amLODipine (NORVASC) 5 MG tablet Take 1 tablet (5 mg total) by mouth daily. 30 tablet 1   apixaban (ELIQUIS) 5 MG TABS tablet Take 1 tablet (5 mg total) by mouth 2 (two) times daily. 60 tablet 5   buPROPion (WELLBUTRIN XL) 300 MG 24 hr tablet TAKE 1 TABLET(300 MG) BY MOUTH DAILY WITH BREAKFAST 90 tablet 0   clonazePAM (KLONOPIN) 1 MG tablet TAKE 1 TABLET (1 MG TOTAL) BY MOUTH DAILY AS NEEDED FOR ANXIETY. PLEASE LIMIT USE 30 tablet 2   empagliflozin (JARDIANCE) 25 MG TABS tablet Take 25 mg by mouth daily. 30 tablet 1   fluticasone furoate-vilanterol (BREO ELLIPTA) 200-25 MCG/ACT AEPB Inhale 1 puff into the lungs daily. 1 each 0   HUMALOG 100 UNIT/ML injection SMARTSIG:0-120 Unit(s) SUB-Q Daily     insulin aspart (NOVOLOG) 100 UNIT/ML injection INJECT UP TO 120 UNITS UNDER THE SKIN AS DIRECTED DAILY VIA INSULIN PUMP     levothyroxine (SYNTHROID) 75 MCG tablet TAKE 1 TABLET BY MOUTH DAILY AT 6 AM 90 tablet 0   lurasidone (LATUDA) 40 MG TABS tablet Take 1 tablet (40 mg total) by mouth daily with supper. 30 tablet 1   metoprolol succinate (TOPROL-XL) 25 MG 24 hr tablet TAKE 1 TABLET(25 MG) BY MOUTH EVERY DAY     mometasone (ELOCON) 0.1 % cream Apply to rash on arms and chest twice daily until improved.  45 g 1   ondansetron (ZOFRAN-ODT) 4 MG  disintegrating tablet Take 4 mg by mouth every 8 (eight) hours as needed for nausea or vomiting.     pantoprazole (PROTONIX) 40 MG tablet TAKE 1 TABLET(40 MG) BY MOUTH TWICE DAILY     rosuvastatin (CRESTOR) 20 MG tablet Take 20 mg by mouth at bedtime.     sodium hypochlorite (DAKIN'S 1/4 STRENGTH) 0.125 % SOLN      venlafaxine XR (EFFEXOR XR) 37.5 MG 24 hr capsule Take 1 capsule (37.5 mg total) by mouth daily with breakfast. 30 capsule 1   zolpidem (AMBIEN) 10 MG tablet TAKE 1 TABLET(10 MG) BY MOUTH AT BEDTIME AS NEEDED FOR SLEEP 30 tablet 2   No current facility-administered medications for this visit.     Musculoskeletal: Strength & Muscle Tone: within normal limits Gait & Station: normal Patient leans: N/A  Psychiatric Specialty Exam: Review of Systems  Psychiatric/Behavioral:  Positive for decreased concentration, dysphoric mood and sleep disturbance.     Blood pressure (!) 192/81, pulse 93, temperature (!) 97.4 F (36.3 C), temperature source Skin, height 6' (1.829 m), weight 246 lb 9.6 oz (111.9 kg).Body mass index is 33.44 kg/m.  General Appearance: Fairly Groomed  Eye Contact:  Fair  Speech:  Normal Rate  Volume:   baseline , S/P Tracheostomy   Mood:  Depressed  Affect:  Depressed  Thought Process:  Goal Directed and Descriptions of Associations: Intact  Orientation:  Full (Time, Place, and Person)  Thought Content: Logical   Suicidal Thoughts:  No  Homicidal Thoughts:  No  Memory:  Immediate;   Fair Recent;   Fair Remote;   Fair  Judgement:  Fair  Insight:  Fair  Psychomotor Activity:  Normal  Concentration:  Concentration: Poor and Attention Span: Poor  Recall:  Poor  Fund of Knowledge: Fair  Language: Fair  Akathisia:  No  Handed:  Right  AIMS (if indicated):  done  Assets:  Communication Skills Desire for Improvement Housing Social Support Transportation  ADL's:  Intact  Cognition: WNL  Sleep:   restless   Screenings: Geneticist, molecular Office  Visit from 12/28/2022 in [facility] Office Visit from 04/05/2022 in [facility] Video Visit from 10/31/2021 in [facility] Office Visit from 08/30/2021 in [facility] Office Visit from 07/05/2021 in [facility]  AIMS Total Score 0 0 0 0 0      AUDIT    Flowsheet Row Admission (Discharged) from 01/22/2019 in [facility] Admission (Discharged) from 12/31/2018 in [facility] BEH[facility]AL MEDICINE Admission (Discharged) from 09/14/2016 in [facility] INPATIENT BEH[facility]AL MEDICINE  Alcohol Use Disorder Identification Test Final Score (AUDIT) 0 0 1      ECT-MADRS    Flowsheet Row Admission (Discharged) from 01/22/2019 in [facility] INPATIENT BEHAV[facility]AL MEDICINE  MADRS Total Score 27      GAD-7    Flowsheet Row Video Visit from 08/01/2022 in [facility] Office Visit from 06/06/2022 in [facility] Office Visit from 04/05/2022 in [facility] Video Visit from 01/16/2022 in [facility]  Total GAD-7 Score 6 3 3  0      Mini-Mental    Flowsheet Row Admission (Discharged) from 01/22/2019 in [facility] INPATIENT BEHAVIORAL MEDICINE  Total Score (max 30 points ) 30  PHQ2-9    Flowsheet Row Office Visit from 12/28/2022 in [facility] Video Visit from 09/28/2022 in [facility] Video Visit from 08/01/2022 in [facility] Office Visit from 06/06/2022 in [facility] Office Visit from 04/05/2022 in [facility]  PHQ-2 Total Score 6 3 0 2 2  PHQ-9  Total Score 22 13 0 6 6      Flowsheet Row Office Visit from 12/28/2022 in [facility] ED to Hosp-Admission (Discharged) from 10/27/2022 in [facility] ([facility] Video Visit from 09/28/2022 in [facility]  C-SSRS RISK CATEGORY Low Risk No Risk Low Risk        Assessment and Plan: AMORI MCNABB is a 62 year old Caucasian male, married, on disability, history of multiple medical problems including MDD, GAD, worsening depression symptoms, patient will benefit from medication readjustment, has been referred to Filomena Jungling to pursue TMS/Ketamine for treatment resistant depression, will benefit from the following plan.  Plan MDD-unstable Taper off Effexor, will reduce Effexor extended release to 37.5 mg p.o. daily Continue Wellbutrin XL 300 mg p.o. daily Increase Latuda to 40 mg p.o. daily with supper I have referred this patient for TMS/ketamine. Contacted [employer] coordinator Ms. Tiffany Deloatch at [phone], informed formal consent provided by patient-coordinated care, discussed previous trials of medications as well as discussed possible treatment with ketamine if he does not meet criteria for TMS.  GAD-unstable Patient to establish care with a therapist, this was discussed at length with patient.  Provided resources. Discussed referral to IOP/PHP patient declines Continue Klonopin 1 mg p.o. daily as needed Reviewed [place] PMP AWARxE  Insomnia-improving Patient currently back on Ambien 10 mg p.o. nightly as needed which is helpful. Patient to work on sleep hygiene avoid naps during the day.  Bereavement-improving Patient will benefit from psychotherapy sessions. Provided supportive therapy.  Collateral information obtained from mother who was in session today as noted above.      Collaboration of Care: Collaboration of Care: Other patient encouraged to follow up with  Filomena Jungling for further treatment for his depression.  Patient/Guardian was advised Release of Information must be obtained prior to any record release in order to collaborate their care with an outside provider. Patient/Guardian was advised if they have not already done so to contact the registration department to sign all necessary forms in order for [place] to release information regarding their care.   Consent: Patient/Guardian gives verbal consent for treatment and assignment of benefits for services provided during this visit. Patient/Guardian expressed understanding and agreed to proceed.   Follow-up in clinic in 2 to 3 weeks or sooner or after completion of TMS/ketamine at [facility].  I have spent atleast 40 minutes face to face with patient today which includes the time spent for preparing to see the patient ( e.g., review of test, records ), obtaining and to review and separately obtained history , coordinating care with Filomena Jungling, [employer] coordinator, obtaining collateral information from mother ordering medications ,psychoeducation and supportive psychotherapy,as well as documenting clinical information in electronic health record.  This note was generated in part or whole with voice recognition software. Voice recognition is usually quite accurate but there are transcription errors that can and very often do occur. I apologize for any typographical errors that were not detected and corrected.     Jomarie Longs, MD 12/29/2022,  8:19 AM

## 2022-12-29 DIAGNOSIS — F32A Depression, unspecified: Secondary | ICD-10-CM | POA: Diagnosis not present

## 2023-01-10 DIAGNOSIS — F332 Major depressive disorder, recurrent severe without psychotic features: Secondary | ICD-10-CM | POA: Diagnosis not present

## 2023-01-12 DIAGNOSIS — E1029 Type 1 diabetes mellitus with other diabetic kidney complication: Secondary | ICD-10-CM | POA: Diagnosis not present

## 2023-01-15 DIAGNOSIS — Z87891 Personal history of nicotine dependence: Secondary | ICD-10-CM | POA: Diagnosis not present

## 2023-01-15 DIAGNOSIS — Z79890 Hormone replacement therapy (postmenopausal): Secondary | ICD-10-CM | POA: Diagnosis not present

## 2023-01-15 DIAGNOSIS — Z794 Long term (current) use of insulin: Secondary | ICD-10-CM | POA: Diagnosis not present

## 2023-01-15 DIAGNOSIS — Z89512 Acquired absence of left leg below knee: Secondary | ICD-10-CM | POA: Diagnosis not present

## 2023-01-15 DIAGNOSIS — E119 Type 2 diabetes mellitus without complications: Secondary | ICD-10-CM | POA: Diagnosis not present

## 2023-01-15 DIAGNOSIS — Z7901 Long term (current) use of anticoagulants: Secondary | ICD-10-CM | POA: Diagnosis not present

## 2023-01-15 DIAGNOSIS — E7800 Pure hypercholesterolemia, unspecified: Secondary | ICD-10-CM | POA: Diagnosis not present

## 2023-01-15 DIAGNOSIS — Z9002 Acquired absence of larynx: Secondary | ICD-10-CM | POA: Diagnosis not present

## 2023-01-15 DIAGNOSIS — Z7982 Long term (current) use of aspirin: Secondary | ICD-10-CM | POA: Diagnosis not present

## 2023-01-15 DIAGNOSIS — Z86718 Personal history of other venous thrombosis and embolism: Secondary | ICD-10-CM | POA: Diagnosis not present

## 2023-01-15 DIAGNOSIS — J386 Stenosis of larynx: Secondary | ICD-10-CM | POA: Diagnosis not present

## 2023-01-15 DIAGNOSIS — Z79899 Other long term (current) drug therapy: Secondary | ICD-10-CM | POA: Diagnosis not present

## 2023-01-15 DIAGNOSIS — E039 Hypothyroidism, unspecified: Secondary | ICD-10-CM | POA: Diagnosis not present

## 2023-01-15 DIAGNOSIS — Z8673 Personal history of transient ischemic attack (TIA), and cerebral infarction without residual deficits: Secondary | ICD-10-CM | POA: Diagnosis not present

## 2023-01-15 DIAGNOSIS — I10 Essential (primary) hypertension: Secondary | ICD-10-CM | POA: Diagnosis not present

## 2023-01-16 DIAGNOSIS — Z430 Encounter for attention to tracheostomy: Secondary | ICD-10-CM | POA: Diagnosis not present

## 2023-01-17 DIAGNOSIS — F332 Major depressive disorder, recurrent severe without psychotic features: Secondary | ICD-10-CM | POA: Diagnosis not present

## 2023-01-18 DIAGNOSIS — M7502 Adhesive capsulitis of left shoulder: Secondary | ICD-10-CM | POA: Diagnosis not present

## 2023-01-18 DIAGNOSIS — M25512 Pain in left shoulder: Secondary | ICD-10-CM | POA: Diagnosis not present

## 2023-01-18 DIAGNOSIS — M6281 Muscle weakness (generalized): Secondary | ICD-10-CM | POA: Diagnosis not present

## 2023-01-18 DIAGNOSIS — F332 Major depressive disorder, recurrent severe without psychotic features: Secondary | ICD-10-CM | POA: Diagnosis not present

## 2023-01-19 ENCOUNTER — Other Ambulatory Visit: Payer: Self-pay | Admitting: Psychiatry

## 2023-01-19 DIAGNOSIS — F411 Generalized anxiety disorder: Secondary | ICD-10-CM

## 2023-01-19 DIAGNOSIS — F332 Major depressive disorder, recurrent severe without psychotic features: Secondary | ICD-10-CM

## 2023-01-22 ENCOUNTER — Telehealth: Payer: Self-pay | Admitting: Psychiatry

## 2023-01-22 DIAGNOSIS — F332 Major depressive disorder, recurrent severe without psychotic features: Secondary | ICD-10-CM | POA: Diagnosis not present

## 2023-01-22 NOTE — Telephone Encounter (Signed)
Patient mom came in stating he has had 2 TMS treatments. States he went in to a depressive state over the weekend while she was out of town over weekend. Also states the last medication you started him on does not make him feel quite right. Hasn't helped him sleep. He is wanting to know if he should stay on all the medication while on the TMS treatments. If you cannot reach him by his number call mother's cell.

## 2023-01-22 NOTE — Telephone Encounter (Signed)
I will have to evaluate him if he is having side effects . Please keep them on a list to be called if any cancellations. Please advice them to ask TMS provider what medications are OK and what is not to OK to continue while TMS. Please let them know he just started TMS , and it takes a few treatments before he will start feeling better.

## 2023-01-23 DIAGNOSIS — F332 Major depressive disorder, recurrent severe without psychotic features: Secondary | ICD-10-CM | POA: Diagnosis not present

## 2023-01-24 DIAGNOSIS — F332 Major depressive disorder, recurrent severe without psychotic features: Secondary | ICD-10-CM | POA: Diagnosis not present

## 2023-01-25 ENCOUNTER — Ambulatory Visit: Payer: HMO | Admitting: Psychiatry

## 2023-01-25 DIAGNOSIS — Z9002 Acquired absence of larynx: Secondary | ICD-10-CM | POA: Diagnosis not present

## 2023-01-25 DIAGNOSIS — Z930 Tracheostomy status: Secondary | ICD-10-CM | POA: Diagnosis not present

## 2023-01-25 DIAGNOSIS — F332 Major depressive disorder, recurrent severe without psychotic features: Secondary | ICD-10-CM | POA: Diagnosis not present

## 2023-01-26 DIAGNOSIS — F332 Major depressive disorder, recurrent severe without psychotic features: Secondary | ICD-10-CM | POA: Diagnosis not present

## 2023-01-29 DIAGNOSIS — E1029 Type 1 diabetes mellitus with other diabetic kidney complication: Secondary | ICD-10-CM | POA: Diagnosis not present

## 2023-01-30 DIAGNOSIS — Z430 Encounter for attention to tracheostomy: Secondary | ICD-10-CM | POA: Diagnosis not present

## 2023-01-30 DIAGNOSIS — F332 Major depressive disorder, recurrent severe without psychotic features: Secondary | ICD-10-CM | POA: Diagnosis not present

## 2023-01-31 DIAGNOSIS — F332 Major depressive disorder, recurrent severe without psychotic features: Secondary | ICD-10-CM | POA: Diagnosis not present

## 2023-02-01 DIAGNOSIS — F332 Major depressive disorder, recurrent severe without psychotic features: Secondary | ICD-10-CM | POA: Diagnosis not present

## 2023-02-02 DIAGNOSIS — F332 Major depressive disorder, recurrent severe without psychotic features: Secondary | ICD-10-CM | POA: Diagnosis not present

## 2023-02-05 DIAGNOSIS — Z9002 Acquired absence of larynx: Secondary | ICD-10-CM | POA: Diagnosis not present

## 2023-02-05 DIAGNOSIS — F332 Major depressive disorder, recurrent severe without psychotic features: Secondary | ICD-10-CM | POA: Diagnosis not present

## 2023-02-05 DIAGNOSIS — R220 Localized swelling, mass and lump, head: Secondary | ICD-10-CM | POA: Diagnosis not present

## 2023-02-05 DIAGNOSIS — I639 Cerebral infarction, unspecified: Secondary | ICD-10-CM | POA: Diagnosis not present

## 2023-02-05 DIAGNOSIS — J9620 Acute and chronic respiratory failure, unspecified whether with hypoxia or hypercapnia: Secondary | ICD-10-CM | POA: Diagnosis not present

## 2023-02-05 DIAGNOSIS — Z930 Tracheostomy status: Secondary | ICD-10-CM | POA: Diagnosis not present

## 2023-02-06 DIAGNOSIS — F332 Major depressive disorder, recurrent severe without psychotic features: Secondary | ICD-10-CM | POA: Diagnosis not present

## 2023-02-07 DIAGNOSIS — M25512 Pain in left shoulder: Secondary | ICD-10-CM | POA: Diagnosis not present

## 2023-02-07 DIAGNOSIS — M6281 Muscle weakness (generalized): Secondary | ICD-10-CM | POA: Diagnosis not present

## 2023-02-07 DIAGNOSIS — M7502 Adhesive capsulitis of left shoulder: Secondary | ICD-10-CM | POA: Diagnosis not present

## 2023-02-07 DIAGNOSIS — F332 Major depressive disorder, recurrent severe without psychotic features: Secondary | ICD-10-CM | POA: Diagnosis not present

## 2023-02-08 DIAGNOSIS — F332 Major depressive disorder, recurrent severe without psychotic features: Secondary | ICD-10-CM | POA: Diagnosis not present

## 2023-02-09 DIAGNOSIS — F332 Major depressive disorder, recurrent severe without psychotic features: Secondary | ICD-10-CM | POA: Diagnosis not present

## 2023-02-09 DIAGNOSIS — M7502 Adhesive capsulitis of left shoulder: Secondary | ICD-10-CM | POA: Diagnosis not present

## 2023-02-12 DIAGNOSIS — F332 Major depressive disorder, recurrent severe without psychotic features: Secondary | ICD-10-CM | POA: Diagnosis not present

## 2023-02-13 DIAGNOSIS — F332 Major depressive disorder, recurrent severe without psychotic features: Secondary | ICD-10-CM | POA: Diagnosis not present

## 2023-02-14 DIAGNOSIS — E1029 Type 1 diabetes mellitus with other diabetic kidney complication: Secondary | ICD-10-CM | POA: Diagnosis not present

## 2023-02-14 DIAGNOSIS — F332 Major depressive disorder, recurrent severe without psychotic features: Secondary | ICD-10-CM | POA: Diagnosis not present

## 2023-02-15 DIAGNOSIS — F332 Major depressive disorder, recurrent severe without psychotic features: Secondary | ICD-10-CM | POA: Diagnosis not present

## 2023-02-16 ENCOUNTER — Other Ambulatory Visit: Payer: Self-pay | Admitting: Psychiatry

## 2023-02-16 DIAGNOSIS — F411 Generalized anxiety disorder: Secondary | ICD-10-CM

## 2023-02-16 DIAGNOSIS — F332 Major depressive disorder, recurrent severe without psychotic features: Secondary | ICD-10-CM | POA: Diagnosis not present

## 2023-02-16 DIAGNOSIS — F5101 Primary insomnia: Secondary | ICD-10-CM

## 2023-02-19 DIAGNOSIS — F332 Major depressive disorder, recurrent severe without psychotic features: Secondary | ICD-10-CM | POA: Diagnosis not present

## 2023-02-20 ENCOUNTER — Other Ambulatory Visit: Payer: Self-pay | Admitting: Psychiatry

## 2023-02-20 DIAGNOSIS — F332 Major depressive disorder, recurrent severe without psychotic features: Secondary | ICD-10-CM | POA: Diagnosis not present

## 2023-02-20 DIAGNOSIS — Z430 Encounter for attention to tracheostomy: Secondary | ICD-10-CM | POA: Diagnosis not present

## 2023-02-21 DIAGNOSIS — F332 Major depressive disorder, recurrent severe without psychotic features: Secondary | ICD-10-CM | POA: Diagnosis not present

## 2023-02-22 DIAGNOSIS — F332 Major depressive disorder, recurrent severe without psychotic features: Secondary | ICD-10-CM | POA: Diagnosis not present

## 2023-02-23 DIAGNOSIS — F332 Major depressive disorder, recurrent severe without psychotic features: Secondary | ICD-10-CM | POA: Diagnosis not present

## 2023-02-24 DIAGNOSIS — Z930 Tracheostomy status: Secondary | ICD-10-CM | POA: Diagnosis not present

## 2023-02-24 DIAGNOSIS — Z9002 Acquired absence of larynx: Secondary | ICD-10-CM | POA: Diagnosis not present

## 2023-02-26 DIAGNOSIS — F332 Major depressive disorder, recurrent severe without psychotic features: Secondary | ICD-10-CM | POA: Diagnosis not present

## 2023-02-27 DIAGNOSIS — F332 Major depressive disorder, recurrent severe without psychotic features: Secondary | ICD-10-CM | POA: Diagnosis not present

## 2023-02-28 DIAGNOSIS — F332 Major depressive disorder, recurrent severe without psychotic features: Secondary | ICD-10-CM | POA: Diagnosis not present

## 2023-03-02 DIAGNOSIS — F332 Major depressive disorder, recurrent severe without psychotic features: Secondary | ICD-10-CM | POA: Diagnosis not present

## 2023-03-05 DIAGNOSIS — M7502 Adhesive capsulitis of left shoulder: Secondary | ICD-10-CM | POA: Diagnosis not present

## 2023-03-06 DIAGNOSIS — F332 Major depressive disorder, recurrent severe without psychotic features: Secondary | ICD-10-CM | POA: Diagnosis not present

## 2023-03-07 DIAGNOSIS — E1022 Type 1 diabetes mellitus with diabetic chronic kidney disease: Secondary | ICD-10-CM | POA: Diagnosis not present

## 2023-03-07 DIAGNOSIS — N1832 Chronic kidney disease, stage 3b: Secondary | ICD-10-CM | POA: Diagnosis not present

## 2023-03-07 DIAGNOSIS — Z79899 Other long term (current) drug therapy: Secondary | ICD-10-CM | POA: Diagnosis not present

## 2023-03-07 DIAGNOSIS — R972 Elevated prostate specific antigen [PSA]: Secondary | ICD-10-CM | POA: Diagnosis not present

## 2023-03-08 DIAGNOSIS — F332 Major depressive disorder, recurrent severe without psychotic features: Secondary | ICD-10-CM | POA: Diagnosis not present

## 2023-03-12 DIAGNOSIS — F332 Major depressive disorder, recurrent severe without psychotic features: Secondary | ICD-10-CM | POA: Diagnosis not present

## 2023-03-13 DIAGNOSIS — R198 Other specified symptoms and signs involving the digestive system and abdomen: Secondary | ICD-10-CM | POA: Diagnosis not present

## 2023-03-13 DIAGNOSIS — K449 Diaphragmatic hernia without obstruction or gangrene: Secondary | ICD-10-CM | POA: Diagnosis not present

## 2023-03-13 DIAGNOSIS — R61 Generalized hyperhidrosis: Secondary | ICD-10-CM | POA: Diagnosis not present

## 2023-03-13 DIAGNOSIS — R110 Nausea: Secondary | ICD-10-CM | POA: Diagnosis not present

## 2023-03-13 DIAGNOSIS — R1084 Generalized abdominal pain: Secondary | ICD-10-CM | POA: Diagnosis not present

## 2023-03-13 DIAGNOSIS — R634 Abnormal weight loss: Secondary | ICD-10-CM | POA: Diagnosis not present

## 2023-03-13 DIAGNOSIS — K219 Gastro-esophageal reflux disease without esophagitis: Secondary | ICD-10-CM | POA: Diagnosis not present

## 2023-03-14 DIAGNOSIS — F332 Major depressive disorder, recurrent severe without psychotic features: Secondary | ICD-10-CM | POA: Diagnosis not present

## 2023-03-16 DIAGNOSIS — F332 Major depressive disorder, recurrent severe without psychotic features: Secondary | ICD-10-CM | POA: Diagnosis not present

## 2023-03-16 DIAGNOSIS — E1029 Type 1 diabetes mellitus with other diabetic kidney complication: Secondary | ICD-10-CM | POA: Diagnosis not present

## 2023-03-20 ENCOUNTER — Encounter: Payer: Self-pay | Admitting: Psychiatry

## 2023-03-20 ENCOUNTER — Ambulatory Visit (INDEPENDENT_AMBULATORY_CARE_PROVIDER_SITE_OTHER): Payer: PPO | Admitting: Psychiatry

## 2023-03-20 VITALS — BP 138/98 | HR 78 | Temp 95.8°F | Ht 72.0 in | Wt 241.8 lb

## 2023-03-20 DIAGNOSIS — Z634 Disappearance and death of family member: Secondary | ICD-10-CM | POA: Diagnosis not present

## 2023-03-20 DIAGNOSIS — F3341 Major depressive disorder, recurrent, in partial remission: Secondary | ICD-10-CM

## 2023-03-20 DIAGNOSIS — Z9189 Other specified personal risk factors, not elsewhere classified: Secondary | ICD-10-CM | POA: Diagnosis not present

## 2023-03-20 DIAGNOSIS — R630 Anorexia: Secondary | ICD-10-CM | POA: Diagnosis not present

## 2023-03-20 DIAGNOSIS — E1022 Type 1 diabetes mellitus with diabetic chronic kidney disease: Secondary | ICD-10-CM | POA: Diagnosis not present

## 2023-03-20 DIAGNOSIS — F5101 Primary insomnia: Secondary | ICD-10-CM

## 2023-03-20 DIAGNOSIS — F411 Generalized anxiety disorder: Secondary | ICD-10-CM | POA: Diagnosis not present

## 2023-03-20 DIAGNOSIS — R110 Nausea: Secondary | ICD-10-CM | POA: Diagnosis not present

## 2023-03-20 DIAGNOSIS — N1832 Chronic kidney disease, stage 3b: Secondary | ICD-10-CM | POA: Diagnosis not present

## 2023-03-20 DIAGNOSIS — I10 Essential (primary) hypertension: Secondary | ICD-10-CM | POA: Diagnosis not present

## 2023-03-20 MED ORDER — BUPROPION HCL ER (XL) 300 MG PO TB24: ORAL_TABLET | ORAL | 0 refills | Status: DC

## 2023-03-20 MED ORDER — NORTRIPTYLINE HCL 25 MG PO CAPS: 25.0000 mg | ORAL_CAPSULE | Freq: Every day | ORAL | 1 refills | Status: DC

## 2023-03-20 NOTE — Patient Instructions (Signed)
Please call for EKG - [phone]    Nortriptyline Capsules What is this medication? NORTRIPTYLINE (nor TRIP ti leen) treats depression. It increases the amount of serotonin and norepinephrine in the brain, substances that help regulate mood. It belongs to a group of medications called tricyclic antidepressants (TCAs). This medicine may be used for other purposes; ask your health care provider or pharmacist if you have questions. COMMON BRAND NAME(S): Aventyl, Pamelor What should I tell my care team before I take this medication? They need to know if you have any of these conditions: Bipolar disorder Brugada syndrome Frequently drink alcohol Glaucoma Heart or blood vessel conditions History of heart attack or stroke Liver disease Schizophrenia Seizures Suicidal thoughts, plans, or attempt by you or a family member Thyroid disease Trouble passing urine An unusual or allergic reaction to nortriptyline, other medications, foods, dyes, or preservatives Pregnant or trying to get pregnant Breastfeeding How should I use this medication? Take this medication by mouth with a glass of water. Follow the directions on the prescription label. Take your doses at regular intervals. Do not take it more often than directed. Do not stop taking this medication suddenly except upon the advice of your care team. Stopping this medication too quickly may cause serious side effects or your condition may worsen. A special MedGuide will be given to you by the pharmacist with each prescription and refill. Be sure to read this information carefully each time. Talk to your care team about the use of this medication in children. Special care may be needed. Overdosage: If you think you have taken too much of this medicine contact a poison control center or emergency room at once. NOTE: This medicine is only for you. Do not share this medicine with others. What if I miss a dose? If you miss a dose, take it as  soon as you can. If it is almost time for your next dose, take only that dose. Do not take double or extra doses. What may interact with this medication? Do not take this medication with any of the following: Cisapride Dronedarone Linezolid MAOIs, such as Marplan, Nardil, and Parnate Methylene blue (injected into a vein) Pimozide Thioridazine This medication may also interact with the following: Alcohol Antihistamines for allergy, cough, and cold Atropine Buspirone Certain medications for bladder problems, such as oxybutynin or tolterodine Certain medications for depression, such as amitriptyline, fluoxetine, sertraline Certain medications for headaches, such as sumatriptan or rizatriptan Certain medications for Parkinson disease, such as benztropine or trihexyphenidyl Certain medications for stomach problems, such as dicyclomine or hyoscyamine Certain medications for travel sickness, such as scopolamine Chlorpropamide Cimetidine Fentanyl Ipratropium Lithium Other medications that cause heart rhythm changes, such as dofetilide Quinidine Reserpine St. John's wort Thyroid medication Tramadol Tryptophan This list may not describe all possible interactions. Give your health care provider a list of all the medicines, herbs, non-prescription drugs, or dietary supplements you use. Also tell them if you smoke, drink alcohol, or use illegal drugs. Some items may interact with your medicine. What should I watch for while using this medication? Visit your care team for regular checks on your progress. It may be some time before you see the benefit from this medication. This medication may cause thoughts of suicide or depression. This includes sudden changes in mood, behaviors, or thoughts. These changes can happen at any time but are more common in the beginning of treatment or after a change in dose. Call your care team right away if you experience these  thoughts or worsening  depression. This medication may affect your coordination, reaction time, or judgment. Do not drive or operate machinery until you know how this medication affects you. Sit up or stand slowly to reduce the risk of dizzy or fainting spells. Drinking alcohol with this medication can increase the risk of these side effects. Your mouth may get dry. Chewing sugarless gum or sucking hard candy and drinking plenty of water may help. Contact your care team if the problem does not go away or is severe. This medication may cause dry eyes and blurred vision. If you wear contact lenses, you may feel some discomfort. Lubricating eye drops may help. See your care team if the problem does not go away or is severe. This medication will cause constipation. If you do not have a bowel movement for 3 days, call your care team. This medication can make you more sensitive to the sun. Keep out of the sun. If you cannot avoid being in the sun, wear protective clothing and sunscreen. Do not use sun lamps, tanning beds, or tanning booths. What side effects may I notice from receiving this medication? Side effects that you should report to your care team as soon as possible: Allergic reactions--skin rash, itching, hives, swelling of the face, lips, tongue, or throat Heart rhythm changes--fast or irregular heartbeat, dizziness, feeling faint or lightheaded, chest pain, trouble breathing Irritability, confusion, fast or irregular heartbeat, muscle stiffness, twitching muscles, sweating, high fever, seizure, chills, vomiting, diarrhea, which may be signs of serotonin syndrome Seizures Sudden eye pain or change in vision such as blurry vision, seeing halos around lights, vision loss Thoughts of suicide or self-harm, worsening mood, feelings of depression Trouble passing urine Side effects that usually do not require medical attention (report to your care team if they continue or are bothersome): Change in sex drive or  performance Constipation Dizziness Drowsiness Dry mouth Tremors or shaking This list may not describe all possible side effects. Call your doctor for medical advice about side effects. You may report side effects to FDA at 1-800-FDA-1088. Where should I keep my medication? Keep out of the reach of children. Store at room temperature between 15 and 30 degrees C (59 and 86 degrees F). Keep container tightly closed. Throw away any unused medication after the expiration date. NOTE: This sheet is a summary. It may not cover all possible information. If you have questions about this medicine, talk to your doctor, pharmacist, or health care provider.  2024 Elsevier/Gold Standard (2022-08-02 00:00:00)

## 2023-03-21 NOTE — Progress Notes (Signed)
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  Chronic kidney disease, hypertension, NSTEMI, hypothyroidism, chronic pain was evaluated in office today.  Patient as well as mother presented together for the evaluation.  Mother provided collateral information.  Patient today appeared to be alert, cooperative.  Patient reports he completed TMS with Filomena Jungling.  Although his depression symptoms may have improved mildly he continues to struggle on a regular basis.  Patient reports he constantly feels tired, fatigued as well as feels nauseous.  He continues to have sweats at night.  The sweats happens without any triggers and has no correlation to his mood symptoms.  Patient unable to elaborate further about how he feels.  According to mother he does not do much, continues to struggle with motivation to get out of his house and participate in activities.  Sleep also likely excessive since he does take naps during the day.  Patient reports sleep at night is overall okay.  According to mother they did talk to primary care provider and he is currently referred to gastroenterology.  He has upcoming colonoscopy/endoscopy scheduled.  Patient also has upcoming appointment with endocrinologist.  Mother brought in a letter from primary care provider, which noted recommendations to start medications like nortriptyline or Seroquel.  Patient is no longer taking the venlafaxine, he tapered himself off of it.  Patient  agreeable to trial of nortriptyline.  He is not interested in retrial of Seroquel which he tried in the past.  Patient currently denies any suicidality, homicidality or perceptual disturbances.  Patient motivated to establish care with a therapist.  Patient denies any other concerns today.  Visit Diagnosis:    ICD-10-CM   1. MDD (major depressive disorder), recurrent, in partial remission (HCC)  F33.41 nortriptyline (PAMELOR) 25 MG capsule    2. GAD (generalized anxiety disorder)  F41.1     3. Primary insomnia  F51.01     4. Bereavement  Z63.4 buPROPion (WELLBUTRIN XL) 300 MG 24 hr tablet    5. At risk for prolonged QT interval syndrome  Z91.89 EKG 12-Lead      Past Psychiatric History: I have reviewed past psychiatric history from progress note on 01/15/2019.  Past trials of fluoxetine, Rexulti, fluoxetine, Celexa, Lexapro, Wellbutrin, Zyprexa, Seroquel, Depakote, Xanax, Abilify.  Completed TMS-04/05/2020, October 2024.  Patient also had a TMS session prior to 2021.  History of ECT-did not tolerate it.  Multiple inpatient behavioral health admissions in 2020.  Possible remote history of suicide attempts in 2014.  Patient however reports it was accidental and he did not intend to kill himself.  Past Medical History:  Past Medical History:  Diagnosis Date   Depression    Diabetes (HCC)    Insulin Pump   Diabetes mellitus type I (HCC)    Diabetes mellitus without complication (HCC)    GERD (gastroesophageal reflux disease)    H/O laryngectomy    Heel bone fracture    Hyperlipidemia    Hypertension    Radicular pain of right lower  extremity    Stroke [facility])    Suicide attempt [facility]) 2014   damaged larynx - tracheostomy   Thyroid disease     Past Surgical History:  Procedure Laterality Date   COLONOSCOPY WITH PROPOFOL N/A 05/15/2018   Procedure: COLONOSCOPY WITH PROPOFOL;  Surgeon: [name], Boykin Nearing, MD;  Location: [facility] ENDOSCOPY;  Service: Gastroenterology;  Laterality: N/A;    ESOPHAGOGASTRODUODENOSCOPY N/A 05/15/2018   Procedure: ESOPHAGOGASTRODUODENOSCOPY (EGD);  Surgeon: [name], Boykin Nearing, MD;  Location: [facility] ENDOSCOPY;  Service: Gastroenterology;  Laterality: N/A;   FRACTURE SURGERY     Heel bone reconstruction Left    HERNIA REPAIR  02/2011   Umbilical hernia repair    LARYNGECTOMY     LOWER EXTREMITY ANGIOGRAPHY Left 01/31/2021   Procedure: LOWER EXTREMITY ANGIOGRAPHY;  Surgeon: Annice Needy, MD;  Location: [facility] INVASIVE CV LAB;  Service: Cardiovascular;  Laterality: Left;   LOWER EXTREMITY ANGIOGRAPHY Right 02/14/2021   Procedure: LOWER EXTREMITY ANGIOGRAPHY;  Surgeon: Annice Needy, MD;  Location: [facility] INVASIVE CV LAB;  Service: Cardiovascular;  Laterality: Right;   NECK SURGERY     fusion   SPINE SURGERY     TRACHEOSTOMY  2014   from SI attempt    Family Psychiatric History: I have reviewed family psychiatric history from progress note on 01/15/2019.  Family History:  Family History  Problem Relation Age of Onset   Osteoporosis Mother    Diabetes Mother    Hypertension Father    Mental illness Neg Hx     Social History: I have social history from progress note on 01/15/2019. Social History   Socioeconomic History   Marital status: Widowed    Spouse name: Toniann Fail   Number of children: Not on file   Years of education: Not on file   Highest education level: Not on file  Occupational History   Not on file  Tobacco Use   Smoking status: Former    Current packs/day: 0.00    Types: Cigarettes    Quit date: 04/09/2013    Years since quitting: 9.9   Smokeless tobacco: Never  Vaping Use   Vaping status: Never Used  Substance and Sexual Activity   Alcohol use: Yes    Alcohol/week: 2.0 standard drinks of alcohol    Types: 2 Shots of liquor per week    Comment: rare   Drug use: No    Comment: Pt denied; UDS not available   Sexual activity: Yes    Partners: Female    Birth control/protection: Condom  Other Topics Concern   Not on file  Social  History Narrative   Not on file   Social Determinants of Health   Financial Resource Strain: Medium Risk (09/12/2022)   Received from [facility], [facility]   Overall Financial Resource Strain (CARDIA)    Difficulty of Paying Living Expenses: Somewhat hard  Food Insecurity: No Food Insecurity (10/28/2022)   Hunger Vital Sign    Worried About Running Out of Food in the Last Year: Never true    Ran Out of Food in the Last Year: Never true  Recent Concern: Food Insecurity - Food Insecurity Present (09/12/2022)   Received from [facility], [facility]   Hunger Vital Sign    Worried About Running Out of Food in the Last Year: Sometimes true    Ran Out of Food in the Last Year: Never true  Transportation Needs: No Transportation Needs (10/28/2022)   PRAPARE - Transportation  Lack of Transportation (Medical): No    Lack of Transportation (Non-Medical): No  Physical Activity: Not on file  Stress: Not on file  Social Connections: Not on file    Allergies:  Allergies  Allergen Reactions   Buspar [Buspirone]     Makes the patient "flip out"   Depakote [Valproic Acid]     Causes excessive drowsiness   Pregabalin Other (See Comments)    Gum Bleeding   Clopidogrel Rash   Gabapentin Itching and Rash    Metabolic Disorder Labs: Lab Results  Component Value Date   HGBA1C 7.4 (H) 10/28/2022   MPG 165.68 10/28/2022   MPG 177 02/14/2021   No results found for: "PROLACTIN" Lab Results  Component Value Date   CHOL 161 09/15/2016   TRIG 190 (H) 09/15/2016   HDL 41 09/15/2016   CHOLHDL 3.9 09/15/2016   VLDL 38 09/15/2016   LDLCALC 82 09/15/2016   Lab Results  Component Value Date   TSH 1.952 09/15/2016    Therapeutic Level Labs: No results found for: "LITHIUM" Lab Results  Component Value Date   VALPROATE 19 (L) 09/12/2016   No results found for: "CBMZ"  Current Medications: Current Outpatient  Medications  Medication Sig Dispense Refill   albuterol (VENTOLIN HFA) 108 (90 Base) MCG/ACT inhaler Inhale 2 puffs into the lungs every 6 (six) hours as needed for wheezing or shortness of breath. 8 g 2   amLODipine (NORVASC) 5 MG tablet Take 1 tablet (5 mg total) by mouth daily. 30 tablet 1   apixaban (ELIQUIS) 5 MG TABS tablet Take 1 tablet (5 mg total) by mouth 2 (two) times daily. 60 tablet 5   clonazePAM (KLONOPIN) 1 MG tablet TAKE 1 TABLET (1 MG TOTAL) BY MOUTH DAILY AS NEEDED FOR ANXIETY. PLEASE LIMIT USE 30 tablet 2   Continuous Glucose Sensor (DEXCOM G7 SENSOR) MISC Use 1 each every 10 (ten) days     empagliflozin (JARDIANCE) 25 MG TABS tablet Take 25 mg by mouth daily. 30 tablet 1   fluticasone furoate-vilanterol (BREO ELLIPTA) 200-25 MCG/ACT AEPB Inhale 1 puff into the lungs daily. 1 each 0   HUMALOG 100 UNIT/ML injection SMARTSIG:0-120 Unit(s) SUB-Q Daily     insulin aspart (NOVOLOG) 100 UNIT/ML injection INJECT UP TO 120 UNITS UNDER THE SKIN AS DIRECTED DAILY VIA INSULIN PUMP     levothyroxine (SYNTHROID) 75 MCG tablet TAKE 1 TABLET BY MOUTH DAILY AT 6 AM 90 tablet 0   metoprolol succinate (TOPROL-XL) 25 MG 24 hr tablet TAKE 1 TABLET(25 MG) BY MOUTH EVERY DAY     mometasone (ELOCON) 0.1 % cream Apply to rash on arms and chest twice daily until improved. 45 g 1   nortriptyline (PAMELOR) 25 MG capsule Take 1 capsule (25 mg total) by mouth at bedtime. 30 capsule 1   ondansetron (ZOFRAN-ODT) 4 MG disintegrating tablet Take 4 mg by mouth every 8 (eight) hours as needed for nausea or vomiting.     pantoprazole (PROTONIX) 40 MG tablet TAKE 1 TABLET(40 MG) BY MOUTH TWICE DAILY     rosuvastatin (CRESTOR) 20 MG tablet Take 20 mg by mouth at bedtime.     sodium hypochlorite (DAKIN'S 1/4 STRENGTH) 0.125 % SOLN      zolpidem (AMBIEN) 10 MG tablet TAKE 1 TABLET(10 MG) BY MOUTH AT BEDTIME AS NEEDED FOR SLEEP 30 tablet 2   buPROPion (WELLBUTRIN XL) 300 MG 24 hr tablet TAKE 1 TABLET(300 MG) BY MOUTH  DAILY WITH BREAKFAST 90 tablet 0   No  current facility-administered medications for this visit.     Musculoskeletal: Strength & Muscle Tone: within normal limits Gait & Station: normal Patient leans: N/A  Psychiatric Specialty Exam: Review of Systems  Psychiatric/Behavioral:  Positive for dysphoric mood.     Blood pressure (!) 138/98, pulse 78, temperature (!) 95.8 F (35.4 C), temperature source Skin, height 6' (1.829 m), weight 241 lb 12.8 oz (109.7 kg).Body mass index is 32.79 kg/m.  General Appearance: Fairly Groomed  Eye Contact:  Fair  Speech:  Clear and Coherent  Volume:  Normal  Mood:  Dysphoric  Affect:  Congruent  Thought Process:  Goal Directed and Descriptions of Associations: Intact  Orientation:  Full (Time, Place, and Person)  Thought Content: Logical   Suicidal Thoughts:  No  Homicidal Thoughts:  No  Memory:  Immediate;   Fair Recent;   Fair Remote;   Fair  Judgement:  Fair  Insight:  Fair  Psychomotor Activity:  Normal  Concentration:  Concentration: Fair and Attention Span: Fair  Recall:  [employer] of Knowledge: Fair  Language: Fair  Akathisia:  No  Handed:  Right  AIMS (if indicated): not done  Assets:  Desire for Improvement Housing Social Support Transportation  ADL's:  Intact  Cognition: WNL  Sleep:   excessive at times   Screenings: AIMS    Flowsheet Row Office Visit from 12/28/2022 in [facility] Office Visit from 04/05/2022 in [facility] Video Visit from 10/31/2021 in [facility] Office Visit from 08/30/2021 in [facility] Office Visit from 07/05/2021 in [facility]  AIMS Total Score 0 0 0 0 0      AUDIT    Flowsheet Row Admission (Discharged) from 01/22/2019 in [facility]IORAL [facility]IC[facility] Admission (Discharged) from 12/31/2018  in [facility]ICINE Admission (Discharged) from 09/14/2016 in [facility] IN[facility]IENT BEHAVIORAL MEDICINE  Alcohol Use Disorder Identification Test Final Score (AUDIT) 0 0 1      ECT-MADRS    Flowsheet Row Admission (Discharged) from 01/22/2019 in [facility] INPATIENT BEHAV[facility]AL MEDICINE  MADRS Total Score 27      GAD-7    Flowsheet Row Office Visit from 03/20/2023 in [facility] Video Visit from 08/01/2022 in [facility] Office Visit from 06/06/2022 in [facility] Office Visit from 04/05/2022 in [facility] Video Visit from 01/16/2022 in [facility]  Total GAD-7 Score 8 6 3 3  0      Mini-Mental    Flowsheet Row Admission (Discharged) from 01/22/2019 in [facility] INPATIENT BEHAVIORAL MEDICINE  Total Score (max 30 points ) 30      PHQ2-9    Flowsheet Row Office Visit from 03/20/2023 in [facility] Office Visit from 12/28/2022 in [facility] Video Visit from 09/28/2022 in [facility] Video Visit from 08/01/2022 in [facility] Office Visit from 06/06/2022 in [facility]  PHQ-2 Total Score 2 6 3  0 2  PHQ-9 Total Score 7 22 13  0 6      Flowsheet Row Office Visit from 03/20/2023 in [facility] Office Visit from 12/28/2022 in [facility] ED to Hosp-Admission (Discharged) from 10/27/2022 in [facility] (  1A)  C-SSRS RISK CATEGORY Moderate Risk Low Risk No Risk        Assessment and Plan: RAYN AMACKER is a 62 year old Caucasian male, with multiple medical problems, history of MDD,  GAD, primary insomnia, presented with continued depression symptoms, although recently completed TMS therapy again.  Patient will benefit from medication readjustment as well as reestablishing care with a therapist-plan as noted below.  Plan MDD-unstable Discontinue venlafaxine, patient tapered himself off of it due to side effects. Discontinue Latuda-patient tapered himself off of it. Wellbutrin XL 300 mg p.o. daily Start nortriptyline 25 mg p.o. daily at bedtime Patient advised to reestablish care with therapist. Patient completed TMS therapy October 2024.   GAD-improving Klonopin 1 mg p.o. daily as needed Reviewed [place] PMP AWARxE Start nortriptyline 25 mg at bedtime  Insomnia-stable Continue Ambien 10 mg p.o. nightly as needed  Bereavement-improving Patient to reestablish care with therapist.  At risk for prolonged QT syndrome-we will order EKG.  Patient to call- [phone]  Collateral information obtained from mother as noted above.  Collaboration of Care: Collaboration of Care: Referral or follow-up with counselor/therapist AEB patient encouraged to establish care with therapist.  Patient/Guardian was advised Release of Information must be obtained prior to any record release in order to collaborate their care with an outside provider. Patient/Guardian was advised if they have not already done so to contact the registration department to sign all necessary forms in order for [place] to release information regarding their care.   Consent: Patient/Guardian gives verbal consent for treatment and assignment of benefits for services provided during this visit. Patient/Guardian expressed understanding and agreed to proceed.   This note was generated in part or whole with voice recognition software. Voice recognition is usually quite accurate but there are transcription errors that can and very often do occur. I apologize for any typographical errors that were not detected and  corrected.    Jomarie Longs, MD 03/21/2023, 2:59 PM

## 2023-03-27 DIAGNOSIS — Z9002 Acquired absence of larynx: Secondary | ICD-10-CM | POA: Diagnosis not present

## 2023-03-27 DIAGNOSIS — S88119S Complete traumatic amputation at level between knee and ankle, unspecified lower leg, sequela: Secondary | ICD-10-CM | POA: Diagnosis not present

## 2023-03-27 DIAGNOSIS — Z794 Long term (current) use of insulin: Secondary | ICD-10-CM | POA: Diagnosis not present

## 2023-03-27 DIAGNOSIS — Z7982 Long term (current) use of aspirin: Secondary | ICD-10-CM | POA: Diagnosis not present

## 2023-03-27 DIAGNOSIS — E1029 Type 1 diabetes mellitus with other diabetic kidney complication: Secondary | ICD-10-CM | POA: Diagnosis not present

## 2023-03-27 DIAGNOSIS — Z888 Allergy status to other drugs, medicaments and biological substances status: Secondary | ICD-10-CM | POA: Diagnosis not present

## 2023-03-27 DIAGNOSIS — Z89512 Acquired absence of left leg below knee: Secondary | ICD-10-CM | POA: Diagnosis not present

## 2023-03-27 DIAGNOSIS — Z7901 Long term (current) use of anticoagulants: Secondary | ICD-10-CM | POA: Diagnosis not present

## 2023-03-27 DIAGNOSIS — E039 Hypothyroidism, unspecified: Secondary | ICD-10-CM | POA: Diagnosis not present

## 2023-03-27 DIAGNOSIS — Z448 Encounter for fitting and adjustment of other external prosthetic devices: Secondary | ICD-10-CM | POA: Diagnosis not present

## 2023-03-27 DIAGNOSIS — Z86718 Personal history of other venous thrombosis and embolism: Secondary | ICD-10-CM | POA: Diagnosis not present

## 2023-03-27 DIAGNOSIS — Z79890 Hormone replacement therapy (postmenopausal): Secondary | ICD-10-CM | POA: Diagnosis not present

## 2023-03-27 DIAGNOSIS — Z930 Tracheostomy status: Secondary | ICD-10-CM | POA: Diagnosis not present

## 2023-03-27 DIAGNOSIS — Z963 Presence of artificial larynx: Secondary | ICD-10-CM | POA: Diagnosis not present

## 2023-03-27 DIAGNOSIS — E119 Type 2 diabetes mellitus without complications: Secondary | ICD-10-CM | POA: Diagnosis not present

## 2023-03-27 DIAGNOSIS — Z87891 Personal history of nicotine dependence: Secondary | ICD-10-CM | POA: Diagnosis not present

## 2023-03-27 DIAGNOSIS — Z79899 Other long term (current) drug therapy: Secondary | ICD-10-CM | POA: Diagnosis not present

## 2023-03-27 DIAGNOSIS — I10 Essential (primary) hypertension: Secondary | ICD-10-CM | POA: Diagnosis not present

## 2023-03-27 DIAGNOSIS — Z8673 Personal history of transient ischemic attack (TIA), and cerebral infarction without residual deficits: Secondary | ICD-10-CM | POA: Diagnosis not present

## 2023-03-27 DIAGNOSIS — E7800 Pure hypercholesterolemia, unspecified: Secondary | ICD-10-CM | POA: Diagnosis not present

## 2023-03-29 DIAGNOSIS — Z9002 Acquired absence of larynx: Secondary | ICD-10-CM | POA: Diagnosis not present

## 2023-03-29 DIAGNOSIS — Z8521 Personal history of malignant neoplasm of larynx: Secondary | ICD-10-CM | POA: Diagnosis not present

## 2023-04-02 DIAGNOSIS — Z430 Encounter for attention to tracheostomy: Secondary | ICD-10-CM | POA: Diagnosis not present

## 2023-04-03 DIAGNOSIS — Z86718 Personal history of other venous thrombosis and embolism: Secondary | ICD-10-CM | POA: Diagnosis not present

## 2023-04-03 DIAGNOSIS — R918 Other nonspecific abnormal finding of lung field: Secondary | ICD-10-CM | POA: Diagnosis not present

## 2023-04-03 DIAGNOSIS — U071 COVID-19: Secondary | ICD-10-CM | POA: Diagnosis not present

## 2023-04-03 DIAGNOSIS — E7800 Pure hypercholesterolemia, unspecified: Secondary | ICD-10-CM | POA: Diagnosis not present

## 2023-04-03 DIAGNOSIS — Z89511 Acquired absence of right leg below knee: Secondary | ICD-10-CM | POA: Diagnosis not present

## 2023-04-03 DIAGNOSIS — I6340 Cerebral infarction due to embolism of unspecified cerebral artery: Secondary | ICD-10-CM | POA: Diagnosis not present

## 2023-04-03 DIAGNOSIS — L089 Local infection of the skin and subcutaneous tissue, unspecified: Secondary | ICD-10-CM | POA: Diagnosis not present

## 2023-04-03 DIAGNOSIS — N179 Acute kidney failure, unspecified: Secondary | ICD-10-CM | POA: Diagnosis not present

## 2023-04-03 DIAGNOSIS — Z6835 Body mass index (BMI) 35.0-35.9, adult: Secondary | ICD-10-CM | POA: Diagnosis not present

## 2023-04-03 DIAGNOSIS — E1069 Type 1 diabetes mellitus with other specified complication: Secondary | ICD-10-CM | POA: Diagnosis not present

## 2023-04-03 DIAGNOSIS — T17808A Unspecified foreign body in other parts of respiratory tract causing other injury, initial encounter: Secondary | ICD-10-CM | POA: Diagnosis not present

## 2023-04-03 DIAGNOSIS — E1022 Type 1 diabetes mellitus with diabetic chronic kidney disease: Secondary | ICD-10-CM | POA: Diagnosis not present

## 2023-04-03 DIAGNOSIS — E10649 Type 1 diabetes mellitus with hypoglycemia without coma: Secondary | ICD-10-CM | POA: Diagnosis not present

## 2023-04-03 DIAGNOSIS — T8740 Infection of amputation stump, unspecified extremity: Secondary | ICD-10-CM | POA: Diagnosis not present

## 2023-04-03 DIAGNOSIS — J1282 Pneumonia due to coronavirus disease 2019: Secondary | ICD-10-CM | POA: Diagnosis not present

## 2023-04-03 DIAGNOSIS — E109 Type 1 diabetes mellitus without complications: Secondary | ICD-10-CM | POA: Diagnosis not present

## 2023-04-03 DIAGNOSIS — E1051 Type 1 diabetes mellitus with diabetic peripheral angiopathy without gangrene: Secondary | ICD-10-CM | POA: Diagnosis not present

## 2023-04-03 DIAGNOSIS — E1065 Type 1 diabetes mellitus with hyperglycemia: Secondary | ICD-10-CM | POA: Diagnosis not present

## 2023-04-03 DIAGNOSIS — I480 Paroxysmal atrial fibrillation: Secondary | ICD-10-CM | POA: Diagnosis not present

## 2023-04-03 DIAGNOSIS — R410 Disorientation, unspecified: Secondary | ICD-10-CM | POA: Diagnosis not present

## 2023-04-03 DIAGNOSIS — G479 Sleep disorder, unspecified: Secondary | ICD-10-CM | POA: Diagnosis not present

## 2023-04-03 DIAGNOSIS — T85628A Displacement of other specified internal prosthetic devices, implants and grafts, initial encounter: Secondary | ICD-10-CM | POA: Diagnosis not present

## 2023-04-03 DIAGNOSIS — R638 Other symptoms and signs concerning food and fluid intake: Secondary | ICD-10-CM | POA: Diagnosis not present

## 2023-04-03 DIAGNOSIS — R21 Rash and other nonspecific skin eruption: Secondary | ICD-10-CM | POA: Diagnosis not present

## 2023-04-03 DIAGNOSIS — J9601 Acute respiratory failure with hypoxia: Secondary | ICD-10-CM | POA: Diagnosis not present

## 2023-04-03 DIAGNOSIS — E1029 Type 1 diabetes mellitus with other diabetic kidney complication: Secondary | ICD-10-CM | POA: Diagnosis not present

## 2023-04-03 DIAGNOSIS — E10628 Type 1 diabetes mellitus with other skin complications: Secondary | ICD-10-CM | POA: Diagnosis not present

## 2023-04-03 DIAGNOSIS — T8744 Infection of amputation stump, left lower extremity: Secondary | ICD-10-CM | POA: Diagnosis not present

## 2023-04-03 DIAGNOSIS — Z7409 Other reduced mobility: Secondary | ICD-10-CM | POA: Diagnosis not present

## 2023-04-03 DIAGNOSIS — F39 Unspecified mood [affective] disorder: Secondary | ICD-10-CM | POA: Diagnosis not present

## 2023-04-03 DIAGNOSIS — J9620 Acute and chronic respiratory failure, unspecified whether with hypoxia or hypercapnia: Secondary | ICD-10-CM | POA: Diagnosis not present

## 2023-04-03 DIAGNOSIS — R058 Other specified cough: Secondary | ICD-10-CM | POA: Diagnosis not present

## 2023-04-03 DIAGNOSIS — I4891 Unspecified atrial fibrillation: Secondary | ICD-10-CM | POA: Diagnosis not present

## 2023-04-03 DIAGNOSIS — Z8673 Personal history of transient ischemic attack (TIA), and cerebral infarction without residual deficits: Secondary | ICD-10-CM | POA: Diagnosis not present

## 2023-04-03 DIAGNOSIS — F419 Anxiety disorder, unspecified: Secondary | ICD-10-CM | POA: Diagnosis not present

## 2023-04-03 DIAGNOSIS — E039 Hypothyroidism, unspecified: Secondary | ICD-10-CM | POA: Diagnosis not present

## 2023-04-03 DIAGNOSIS — R059 Cough, unspecified: Secondary | ICD-10-CM | POA: Diagnosis not present

## 2023-04-03 DIAGNOSIS — E669 Obesity, unspecified: Secondary | ICD-10-CM | POA: Diagnosis not present

## 2023-04-03 DIAGNOSIS — Z89512 Acquired absence of left leg below knee: Secondary | ICD-10-CM | POA: Diagnosis not present

## 2023-04-03 DIAGNOSIS — E785 Hyperlipidemia, unspecified: Secondary | ICD-10-CM | POA: Diagnosis not present

## 2023-04-03 DIAGNOSIS — M7989 Other specified soft tissue disorders: Secondary | ICD-10-CM | POA: Diagnosis not present

## 2023-04-03 DIAGNOSIS — K529 Noninfective gastroenteritis and colitis, unspecified: Secondary | ICD-10-CM | POA: Diagnosis not present

## 2023-04-03 DIAGNOSIS — A419 Sepsis, unspecified organism: Secondary | ICD-10-CM | POA: Insufficient documentation

## 2023-04-03 DIAGNOSIS — Z930 Tracheostomy status: Secondary | ICD-10-CM | POA: Diagnosis not present

## 2023-04-03 DIAGNOSIS — K5900 Constipation, unspecified: Secondary | ICD-10-CM | POA: Diagnosis not present

## 2023-04-03 DIAGNOSIS — L0390 Cellulitis, unspecified: Secondary | ICD-10-CM | POA: Diagnosis not present

## 2023-04-03 DIAGNOSIS — E1040 Type 1 diabetes mellitus with diabetic neuropathy, unspecified: Secondary | ICD-10-CM | POA: Diagnosis not present

## 2023-04-03 DIAGNOSIS — Z79899 Other long term (current) drug therapy: Secondary | ICD-10-CM | POA: Diagnosis not present

## 2023-04-03 DIAGNOSIS — R197 Diarrhea, unspecified: Secondary | ICD-10-CM | POA: Diagnosis not present

## 2023-04-03 DIAGNOSIS — T8789 Other complications of amputation stump: Secondary | ICD-10-CM | POA: Diagnosis not present

## 2023-04-03 DIAGNOSIS — J386 Stenosis of larynx: Secondary | ICD-10-CM | POA: Diagnosis not present

## 2023-04-03 DIAGNOSIS — F32A Depression, unspecified: Secondary | ICD-10-CM | POA: Diagnosis not present

## 2023-04-03 DIAGNOSIS — Z9002 Acquired absence of larynx: Secondary | ICD-10-CM | POA: Diagnosis not present

## 2023-04-03 DIAGNOSIS — R600 Localized edema: Secondary | ICD-10-CM | POA: Diagnosis not present

## 2023-04-03 DIAGNOSIS — S81802A Unspecified open wound, left lower leg, initial encounter: Secondary | ICD-10-CM | POA: Diagnosis not present

## 2023-04-03 DIAGNOSIS — E1042 Type 1 diabetes mellitus with diabetic polyneuropathy: Secondary | ICD-10-CM | POA: Diagnosis not present

## 2023-04-03 DIAGNOSIS — E1010 Type 1 diabetes mellitus with ketoacidosis without coma: Secondary | ICD-10-CM | POA: Diagnosis not present

## 2023-04-03 DIAGNOSIS — R451 Restlessness and agitation: Secondary | ICD-10-CM | POA: Diagnosis not present

## 2023-04-03 DIAGNOSIS — R112 Nausea with vomiting, unspecified: Secondary | ICD-10-CM | POA: Diagnosis not present

## 2023-04-03 DIAGNOSIS — R220 Localized swelling, mass and lump, head: Secondary | ICD-10-CM | POA: Diagnosis not present

## 2023-04-03 DIAGNOSIS — F05 Delirium due to known physiological condition: Secondary | ICD-10-CM | POA: Diagnosis not present

## 2023-04-03 DIAGNOSIS — Z79890 Hormone replacement therapy (postmenopausal): Secondary | ICD-10-CM | POA: Diagnosis not present

## 2023-04-03 DIAGNOSIS — I129 Hypertensive chronic kidney disease with stage 1 through stage 4 chronic kidney disease, or unspecified chronic kidney disease: Secondary | ICD-10-CM | POA: Diagnosis not present

## 2023-04-03 DIAGNOSIS — Z792 Long term (current) use of antibiotics: Secondary | ICD-10-CM | POA: Diagnosis not present

## 2023-04-03 DIAGNOSIS — N189 Chronic kidney disease, unspecified: Secondary | ICD-10-CM | POA: Diagnosis not present

## 2023-04-03 DIAGNOSIS — I10 Essential (primary) hypertension: Secondary | ICD-10-CM | POA: Diagnosis not present

## 2023-04-03 DIAGNOSIS — Z66 Do not resuscitate: Secondary | ICD-10-CM | POA: Diagnosis not present

## 2023-04-03 DIAGNOSIS — L299 Pruritus, unspecified: Secondary | ICD-10-CM | POA: Diagnosis not present

## 2023-04-03 DIAGNOSIS — L03116 Cellulitis of left lower limb: Secondary | ICD-10-CM | POA: Diagnosis not present

## 2023-04-03 DIAGNOSIS — R509 Fever, unspecified: Secondary | ICD-10-CM | POA: Diagnosis not present

## 2023-04-03 DIAGNOSIS — R0600 Dyspnea, unspecified: Secondary | ICD-10-CM | POA: Diagnosis not present

## 2023-04-03 DIAGNOSIS — Z794 Long term (current) use of insulin: Secondary | ICD-10-CM | POA: Diagnosis not present

## 2023-04-03 DIAGNOSIS — I639 Cerebral infarction, unspecified: Secondary | ICD-10-CM | POA: Diagnosis not present

## 2023-04-03 DIAGNOSIS — Z6832 Body mass index (BMI) 32.0-32.9, adult: Secondary | ICD-10-CM | POA: Diagnosis not present

## 2023-04-03 DIAGNOSIS — R0603 Acute respiratory distress: Secondary | ICD-10-CM | POA: Diagnosis not present

## 2023-04-04 DIAGNOSIS — E1065 Type 1 diabetes mellitus with hyperglycemia: Secondary | ICD-10-CM | POA: Diagnosis not present

## 2023-04-04 DIAGNOSIS — I129 Hypertensive chronic kidney disease with stage 1 through stage 4 chronic kidney disease, or unspecified chronic kidney disease: Secondary | ICD-10-CM | POA: Diagnosis not present

## 2023-04-04 DIAGNOSIS — U071 COVID-19: Secondary | ICD-10-CM | POA: Diagnosis not present

## 2023-04-04 DIAGNOSIS — N179 Acute kidney failure, unspecified: Secondary | ICD-10-CM | POA: Diagnosis not present

## 2023-04-04 DIAGNOSIS — E10628 Type 1 diabetes mellitus with other skin complications: Secondary | ICD-10-CM | POA: Diagnosis not present

## 2023-04-04 DIAGNOSIS — E039 Hypothyroidism, unspecified: Secondary | ICD-10-CM | POA: Diagnosis not present

## 2023-04-04 DIAGNOSIS — E1010 Type 1 diabetes mellitus with ketoacidosis without coma: Secondary | ICD-10-CM | POA: Diagnosis not present

## 2023-04-04 DIAGNOSIS — E785 Hyperlipidemia, unspecified: Secondary | ICD-10-CM | POA: Diagnosis not present

## 2023-04-04 DIAGNOSIS — I10 Essential (primary) hypertension: Secondary | ICD-10-CM | POA: Diagnosis not present

## 2023-04-04 DIAGNOSIS — Z79899 Other long term (current) drug therapy: Secondary | ICD-10-CM | POA: Diagnosis not present

## 2023-04-04 DIAGNOSIS — J9601 Acute respiratory failure with hypoxia: Secondary | ICD-10-CM | POA: Diagnosis not present

## 2023-04-04 DIAGNOSIS — N189 Chronic kidney disease, unspecified: Secondary | ICD-10-CM | POA: Diagnosis not present

## 2023-04-04 DIAGNOSIS — E669 Obesity, unspecified: Secondary | ICD-10-CM | POA: Diagnosis not present

## 2023-04-04 DIAGNOSIS — T8744 Infection of amputation stump, left lower extremity: Secondary | ICD-10-CM | POA: Diagnosis not present

## 2023-04-04 DIAGNOSIS — E1022 Type 1 diabetes mellitus with diabetic chronic kidney disease: Secondary | ICD-10-CM | POA: Diagnosis not present

## 2023-04-04 DIAGNOSIS — Z8673 Personal history of transient ischemic attack (TIA), and cerebral infarction without residual deficits: Secondary | ICD-10-CM | POA: Diagnosis not present

## 2023-04-04 DIAGNOSIS — K5900 Constipation, unspecified: Secondary | ICD-10-CM | POA: Diagnosis not present

## 2023-04-04 DIAGNOSIS — R638 Other symptoms and signs concerning food and fluid intake: Secondary | ICD-10-CM | POA: Diagnosis not present

## 2023-04-04 DIAGNOSIS — J1282 Pneumonia due to coronavirus disease 2019: Secondary | ICD-10-CM | POA: Diagnosis not present

## 2023-04-04 DIAGNOSIS — F32A Depression, unspecified: Secondary | ICD-10-CM | POA: Diagnosis not present

## 2023-04-04 DIAGNOSIS — Z89511 Acquired absence of right leg below knee: Secondary | ICD-10-CM | POA: Diagnosis not present

## 2023-04-04 DIAGNOSIS — Z792 Long term (current) use of antibiotics: Secondary | ICD-10-CM | POA: Diagnosis not present

## 2023-04-04 DIAGNOSIS — T85628A Displacement of other specified internal prosthetic devices, implants and grafts, initial encounter: Secondary | ICD-10-CM | POA: Diagnosis not present

## 2023-04-04 DIAGNOSIS — R112 Nausea with vomiting, unspecified: Secondary | ICD-10-CM | POA: Diagnosis not present

## 2023-04-04 DIAGNOSIS — T8740 Infection of amputation stump, unspecified extremity: Secondary | ICD-10-CM | POA: Diagnosis not present

## 2023-04-04 DIAGNOSIS — Z6835 Body mass index (BMI) 35.0-35.9, adult: Secondary | ICD-10-CM | POA: Diagnosis not present

## 2023-04-04 DIAGNOSIS — R0600 Dyspnea, unspecified: Secondary | ICD-10-CM | POA: Diagnosis not present

## 2023-04-04 DIAGNOSIS — Z9002 Acquired absence of larynx: Secondary | ICD-10-CM | POA: Diagnosis not present

## 2023-04-04 DIAGNOSIS — R197 Diarrhea, unspecified: Secondary | ICD-10-CM | POA: Diagnosis not present

## 2023-04-04 DIAGNOSIS — L089 Local infection of the skin and subcutaneous tissue, unspecified: Secondary | ICD-10-CM | POA: Diagnosis not present

## 2023-04-05 DIAGNOSIS — K5900 Constipation, unspecified: Secondary | ICD-10-CM | POA: Diagnosis not present

## 2023-04-05 DIAGNOSIS — R918 Other nonspecific abnormal finding of lung field: Secondary | ICD-10-CM | POA: Diagnosis not present

## 2023-04-05 DIAGNOSIS — E1010 Type 1 diabetes mellitus with ketoacidosis without coma: Secondary | ICD-10-CM | POA: Diagnosis not present

## 2023-04-05 DIAGNOSIS — I10 Essential (primary) hypertension: Secondary | ICD-10-CM | POA: Diagnosis not present

## 2023-04-05 DIAGNOSIS — F32A Depression, unspecified: Secondary | ICD-10-CM | POA: Diagnosis not present

## 2023-04-05 DIAGNOSIS — J9601 Acute respiratory failure with hypoxia: Secondary | ICD-10-CM | POA: Diagnosis not present

## 2023-04-05 DIAGNOSIS — T8744 Infection of amputation stump, left lower extremity: Secondary | ICD-10-CM | POA: Diagnosis not present

## 2023-04-05 DIAGNOSIS — R0600 Dyspnea, unspecified: Secondary | ICD-10-CM | POA: Diagnosis not present

## 2023-04-05 DIAGNOSIS — E785 Hyperlipidemia, unspecified: Secondary | ICD-10-CM | POA: Diagnosis not present

## 2023-04-05 DIAGNOSIS — Z79890 Hormone replacement therapy (postmenopausal): Secondary | ICD-10-CM | POA: Diagnosis not present

## 2023-04-05 DIAGNOSIS — K529 Noninfective gastroenteritis and colitis, unspecified: Secondary | ICD-10-CM | POA: Diagnosis not present

## 2023-04-05 DIAGNOSIS — T8740 Infection of amputation stump, unspecified extremity: Secondary | ICD-10-CM | POA: Diagnosis not present

## 2023-04-05 DIAGNOSIS — N179 Acute kidney failure, unspecified: Secondary | ICD-10-CM | POA: Diagnosis not present

## 2023-04-05 DIAGNOSIS — E1022 Type 1 diabetes mellitus with diabetic chronic kidney disease: Secondary | ICD-10-CM | POA: Diagnosis not present

## 2023-04-05 DIAGNOSIS — L03116 Cellulitis of left lower limb: Secondary | ICD-10-CM | POA: Diagnosis not present

## 2023-04-05 DIAGNOSIS — Z86718 Personal history of other venous thrombosis and embolism: Secondary | ICD-10-CM | POA: Diagnosis not present

## 2023-04-05 DIAGNOSIS — Z8673 Personal history of transient ischemic attack (TIA), and cerebral infarction without residual deficits: Secondary | ICD-10-CM | POA: Diagnosis not present

## 2023-04-05 DIAGNOSIS — N189 Chronic kidney disease, unspecified: Secondary | ICD-10-CM | POA: Diagnosis not present

## 2023-04-05 DIAGNOSIS — I129 Hypertensive chronic kidney disease with stage 1 through stage 4 chronic kidney disease, or unspecified chronic kidney disease: Secondary | ICD-10-CM | POA: Diagnosis not present

## 2023-04-05 DIAGNOSIS — E039 Hypothyroidism, unspecified: Secondary | ICD-10-CM | POA: Diagnosis not present

## 2023-04-05 DIAGNOSIS — E1065 Type 1 diabetes mellitus with hyperglycemia: Secondary | ICD-10-CM | POA: Diagnosis not present

## 2023-04-06 DIAGNOSIS — K5900 Constipation, unspecified: Secondary | ICD-10-CM | POA: Diagnosis not present

## 2023-04-06 DIAGNOSIS — E1010 Type 1 diabetes mellitus with ketoacidosis without coma: Secondary | ICD-10-CM | POA: Diagnosis not present

## 2023-04-06 DIAGNOSIS — R410 Disorientation, unspecified: Secondary | ICD-10-CM | POA: Diagnosis not present

## 2023-04-06 DIAGNOSIS — Z86718 Personal history of other venous thrombosis and embolism: Secondary | ICD-10-CM | POA: Diagnosis not present

## 2023-04-06 DIAGNOSIS — L299 Pruritus, unspecified: Secondary | ICD-10-CM | POA: Diagnosis not present

## 2023-04-06 DIAGNOSIS — E1065 Type 1 diabetes mellitus with hyperglycemia: Secondary | ICD-10-CM | POA: Diagnosis not present

## 2023-04-06 DIAGNOSIS — I129 Hypertensive chronic kidney disease with stage 1 through stage 4 chronic kidney disease, or unspecified chronic kidney disease: Secondary | ICD-10-CM | POA: Diagnosis not present

## 2023-04-06 DIAGNOSIS — E669 Obesity, unspecified: Secondary | ICD-10-CM | POA: Diagnosis not present

## 2023-04-06 DIAGNOSIS — I10 Essential (primary) hypertension: Secondary | ICD-10-CM | POA: Diagnosis not present

## 2023-04-06 DIAGNOSIS — Z79899 Other long term (current) drug therapy: Secondary | ICD-10-CM | POA: Diagnosis not present

## 2023-04-06 DIAGNOSIS — E785 Hyperlipidemia, unspecified: Secondary | ICD-10-CM | POA: Diagnosis not present

## 2023-04-06 DIAGNOSIS — J9601 Acute respiratory failure with hypoxia: Secondary | ICD-10-CM | POA: Diagnosis not present

## 2023-04-06 DIAGNOSIS — N189 Chronic kidney disease, unspecified: Secondary | ICD-10-CM | POA: Diagnosis not present

## 2023-04-06 DIAGNOSIS — Z6835 Body mass index (BMI) 35.0-35.9, adult: Secondary | ICD-10-CM | POA: Diagnosis not present

## 2023-04-06 DIAGNOSIS — E039 Hypothyroidism, unspecified: Secondary | ICD-10-CM | POA: Diagnosis not present

## 2023-04-06 DIAGNOSIS — L03116 Cellulitis of left lower limb: Secondary | ICD-10-CM | POA: Diagnosis not present

## 2023-04-06 DIAGNOSIS — T8744 Infection of amputation stump, left lower extremity: Secondary | ICD-10-CM | POA: Diagnosis not present

## 2023-04-06 DIAGNOSIS — Z89511 Acquired absence of right leg below knee: Secondary | ICD-10-CM | POA: Diagnosis not present

## 2023-04-06 DIAGNOSIS — F32A Depression, unspecified: Secondary | ICD-10-CM | POA: Diagnosis not present

## 2023-04-06 DIAGNOSIS — E1022 Type 1 diabetes mellitus with diabetic chronic kidney disease: Secondary | ICD-10-CM | POA: Diagnosis not present

## 2023-04-07 DIAGNOSIS — E669 Obesity, unspecified: Secondary | ICD-10-CM | POA: Diagnosis not present

## 2023-04-07 DIAGNOSIS — E785 Hyperlipidemia, unspecified: Secondary | ICD-10-CM | POA: Diagnosis not present

## 2023-04-07 DIAGNOSIS — J9601 Acute respiratory failure with hypoxia: Secondary | ICD-10-CM | POA: Diagnosis not present

## 2023-04-07 DIAGNOSIS — Z79899 Other long term (current) drug therapy: Secondary | ICD-10-CM | POA: Diagnosis not present

## 2023-04-07 DIAGNOSIS — Z6835 Body mass index (BMI) 35.0-35.9, adult: Secondary | ICD-10-CM | POA: Diagnosis not present

## 2023-04-07 DIAGNOSIS — I10 Essential (primary) hypertension: Secondary | ICD-10-CM | POA: Diagnosis not present

## 2023-04-07 DIAGNOSIS — I129 Hypertensive chronic kidney disease with stage 1 through stage 4 chronic kidney disease, or unspecified chronic kidney disease: Secondary | ICD-10-CM | POA: Diagnosis not present

## 2023-04-07 DIAGNOSIS — E1065 Type 1 diabetes mellitus with hyperglycemia: Secondary | ICD-10-CM | POA: Diagnosis not present

## 2023-04-07 DIAGNOSIS — Z86718 Personal history of other venous thrombosis and embolism: Secondary | ICD-10-CM | POA: Diagnosis not present

## 2023-04-07 DIAGNOSIS — Z79890 Hormone replacement therapy (postmenopausal): Secondary | ICD-10-CM | POA: Diagnosis not present

## 2023-04-07 DIAGNOSIS — T8740 Infection of amputation stump, unspecified extremity: Secondary | ICD-10-CM | POA: Diagnosis not present

## 2023-04-07 DIAGNOSIS — K5900 Constipation, unspecified: Secondary | ICD-10-CM | POA: Diagnosis not present

## 2023-04-07 DIAGNOSIS — N189 Chronic kidney disease, unspecified: Secondary | ICD-10-CM | POA: Diagnosis not present

## 2023-04-07 DIAGNOSIS — F32A Depression, unspecified: Secondary | ICD-10-CM | POA: Diagnosis not present

## 2023-04-07 DIAGNOSIS — E1010 Type 1 diabetes mellitus with ketoacidosis without coma: Secondary | ICD-10-CM | POA: Diagnosis not present

## 2023-04-07 DIAGNOSIS — E1022 Type 1 diabetes mellitus with diabetic chronic kidney disease: Secondary | ICD-10-CM | POA: Diagnosis not present

## 2023-04-07 DIAGNOSIS — Z89511 Acquired absence of right leg below knee: Secondary | ICD-10-CM | POA: Diagnosis not present

## 2023-04-07 DIAGNOSIS — E039 Hypothyroidism, unspecified: Secondary | ICD-10-CM | POA: Diagnosis not present

## 2023-04-08 DIAGNOSIS — E1022 Type 1 diabetes mellitus with diabetic chronic kidney disease: Secondary | ICD-10-CM | POA: Diagnosis not present

## 2023-04-08 DIAGNOSIS — R21 Rash and other nonspecific skin eruption: Secondary | ICD-10-CM | POA: Diagnosis not present

## 2023-04-08 DIAGNOSIS — R600 Localized edema: Secondary | ICD-10-CM | POA: Diagnosis not present

## 2023-04-08 DIAGNOSIS — E669 Obesity, unspecified: Secondary | ICD-10-CM | POA: Diagnosis not present

## 2023-04-08 DIAGNOSIS — E1010 Type 1 diabetes mellitus with ketoacidosis without coma: Secondary | ICD-10-CM | POA: Diagnosis not present

## 2023-04-08 DIAGNOSIS — J9601 Acute respiratory failure with hypoxia: Secondary | ICD-10-CM | POA: Diagnosis not present

## 2023-04-08 DIAGNOSIS — T8740 Infection of amputation stump, unspecified extremity: Secondary | ICD-10-CM | POA: Diagnosis not present

## 2023-04-08 DIAGNOSIS — I10 Essential (primary) hypertension: Secondary | ICD-10-CM | POA: Diagnosis not present

## 2023-04-08 DIAGNOSIS — T8744 Infection of amputation stump, left lower extremity: Secondary | ICD-10-CM | POA: Diagnosis not present

## 2023-04-08 DIAGNOSIS — Z8673 Personal history of transient ischemic attack (TIA), and cerebral infarction without residual deficits: Secondary | ICD-10-CM | POA: Diagnosis not present

## 2023-04-08 DIAGNOSIS — E039 Hypothyroidism, unspecified: Secondary | ICD-10-CM | POA: Diagnosis not present

## 2023-04-08 DIAGNOSIS — L089 Local infection of the skin and subcutaneous tissue, unspecified: Secondary | ICD-10-CM | POA: Diagnosis not present

## 2023-04-08 DIAGNOSIS — E1065 Type 1 diabetes mellitus with hyperglycemia: Secondary | ICD-10-CM | POA: Diagnosis not present

## 2023-04-09 ENCOUNTER — Telehealth: Payer: Self-pay | Admitting: Psychiatry

## 2023-04-09 DIAGNOSIS — R21 Rash and other nonspecific skin eruption: Secondary | ICD-10-CM | POA: Diagnosis not present

## 2023-04-09 DIAGNOSIS — Z86718 Personal history of other venous thrombosis and embolism: Secondary | ICD-10-CM | POA: Diagnosis not present

## 2023-04-09 DIAGNOSIS — E1022 Type 1 diabetes mellitus with diabetic chronic kidney disease: Secondary | ICD-10-CM | POA: Diagnosis not present

## 2023-04-09 DIAGNOSIS — E1010 Type 1 diabetes mellitus with ketoacidosis without coma: Secondary | ICD-10-CM | POA: Diagnosis not present

## 2023-04-09 DIAGNOSIS — J9601 Acute respiratory failure with hypoxia: Secondary | ICD-10-CM | POA: Diagnosis not present

## 2023-04-09 DIAGNOSIS — Z6835 Body mass index (BMI) 35.0-35.9, adult: Secondary | ICD-10-CM | POA: Diagnosis not present

## 2023-04-09 DIAGNOSIS — N189 Chronic kidney disease, unspecified: Secondary | ICD-10-CM | POA: Diagnosis not present

## 2023-04-09 DIAGNOSIS — M7989 Other specified soft tissue disorders: Secondary | ICD-10-CM | POA: Diagnosis not present

## 2023-04-09 DIAGNOSIS — R600 Localized edema: Secondary | ICD-10-CM | POA: Diagnosis not present

## 2023-04-09 DIAGNOSIS — Z79899 Other long term (current) drug therapy: Secondary | ICD-10-CM | POA: Diagnosis not present

## 2023-04-09 DIAGNOSIS — I4891 Unspecified atrial fibrillation: Secondary | ICD-10-CM | POA: Insufficient documentation

## 2023-04-09 DIAGNOSIS — E669 Obesity, unspecified: Secondary | ICD-10-CM | POA: Diagnosis not present

## 2023-04-09 DIAGNOSIS — E1065 Type 1 diabetes mellitus with hyperglycemia: Secondary | ICD-10-CM | POA: Diagnosis not present

## 2023-04-09 DIAGNOSIS — I480 Paroxysmal atrial fibrillation: Secondary | ICD-10-CM | POA: Diagnosis not present

## 2023-04-09 DIAGNOSIS — I129 Hypertensive chronic kidney disease with stage 1 through stage 4 chronic kidney disease, or unspecified chronic kidney disease: Secondary | ICD-10-CM | POA: Diagnosis not present

## 2023-04-09 DIAGNOSIS — T8740 Infection of amputation stump, unspecified extremity: Secondary | ICD-10-CM | POA: Diagnosis not present

## 2023-04-09 DIAGNOSIS — E039 Hypothyroidism, unspecified: Secondary | ICD-10-CM | POA: Diagnosis not present

## 2023-04-09 NOTE — Telephone Encounter (Signed)
Patients mom called to cancel his appointment. He has been hospitalized at [facility] since November 19. They will call once discharged and not sure when due to he has multiple things that are wrong with him she said. Wanted you to be aware.

## 2023-04-09 NOTE — Telephone Encounter (Signed)
Noted, thank you for the update

## 2023-04-10 DIAGNOSIS — Z79899 Other long term (current) drug therapy: Secondary | ICD-10-CM | POA: Diagnosis not present

## 2023-04-10 DIAGNOSIS — T8740 Infection of amputation stump, unspecified extremity: Secondary | ICD-10-CM | POA: Diagnosis not present

## 2023-04-10 DIAGNOSIS — E1065 Type 1 diabetes mellitus with hyperglycemia: Secondary | ICD-10-CM | POA: Diagnosis not present

## 2023-04-10 DIAGNOSIS — E669 Obesity, unspecified: Secondary | ICD-10-CM | POA: Diagnosis not present

## 2023-04-10 DIAGNOSIS — I4891 Unspecified atrial fibrillation: Secondary | ICD-10-CM | POA: Diagnosis not present

## 2023-04-10 DIAGNOSIS — J9601 Acute respiratory failure with hypoxia: Secondary | ICD-10-CM | POA: Diagnosis not present

## 2023-04-10 DIAGNOSIS — I129 Hypertensive chronic kidney disease with stage 1 through stage 4 chronic kidney disease, or unspecified chronic kidney disease: Secondary | ICD-10-CM | POA: Diagnosis not present

## 2023-04-10 DIAGNOSIS — L03116 Cellulitis of left lower limb: Secondary | ICD-10-CM | POA: Diagnosis not present

## 2023-04-10 DIAGNOSIS — N189 Chronic kidney disease, unspecified: Secondary | ICD-10-CM | POA: Diagnosis not present

## 2023-04-10 DIAGNOSIS — E1022 Type 1 diabetes mellitus with diabetic chronic kidney disease: Secondary | ICD-10-CM | POA: Diagnosis not present

## 2023-04-10 DIAGNOSIS — Z6835 Body mass index (BMI) 35.0-35.9, adult: Secondary | ICD-10-CM | POA: Diagnosis not present

## 2023-04-10 DIAGNOSIS — E1010 Type 1 diabetes mellitus with ketoacidosis without coma: Secondary | ICD-10-CM | POA: Diagnosis not present

## 2023-04-10 DIAGNOSIS — R600 Localized edema: Secondary | ICD-10-CM | POA: Diagnosis not present

## 2023-04-11 ENCOUNTER — Other Ambulatory Visit: Payer: Self-pay | Admitting: Psychiatry

## 2023-04-11 DIAGNOSIS — R600 Localized edema: Secondary | ICD-10-CM | POA: Diagnosis not present

## 2023-04-11 DIAGNOSIS — J9601 Acute respiratory failure with hypoxia: Secondary | ICD-10-CM | POA: Diagnosis not present

## 2023-04-11 DIAGNOSIS — E1022 Type 1 diabetes mellitus with diabetic chronic kidney disease: Secondary | ICD-10-CM | POA: Diagnosis not present

## 2023-04-11 DIAGNOSIS — T8744 Infection of amputation stump, left lower extremity: Secondary | ICD-10-CM | POA: Diagnosis not present

## 2023-04-11 DIAGNOSIS — F3341 Major depressive disorder, recurrent, in partial remission: Secondary | ICD-10-CM

## 2023-04-11 DIAGNOSIS — I4891 Unspecified atrial fibrillation: Secondary | ICD-10-CM | POA: Diagnosis not present

## 2023-04-11 DIAGNOSIS — L089 Local infection of the skin and subcutaneous tissue, unspecified: Secondary | ICD-10-CM | POA: Diagnosis not present

## 2023-04-11 DIAGNOSIS — E109 Type 1 diabetes mellitus without complications: Secondary | ICD-10-CM | POA: Diagnosis not present

## 2023-04-11 DIAGNOSIS — I129 Hypertensive chronic kidney disease with stage 1 through stage 4 chronic kidney disease, or unspecified chronic kidney disease: Secondary | ICD-10-CM | POA: Diagnosis not present

## 2023-04-11 DIAGNOSIS — E1010 Type 1 diabetes mellitus with ketoacidosis without coma: Secondary | ICD-10-CM | POA: Diagnosis not present

## 2023-04-11 DIAGNOSIS — N189 Chronic kidney disease, unspecified: Secondary | ICD-10-CM | POA: Diagnosis not present

## 2023-04-11 DIAGNOSIS — E669 Obesity, unspecified: Secondary | ICD-10-CM | POA: Diagnosis not present

## 2023-04-11 DIAGNOSIS — E039 Hypothyroidism, unspecified: Secondary | ICD-10-CM | POA: Diagnosis not present

## 2023-04-11 DIAGNOSIS — T8740 Infection of amputation stump, unspecified extremity: Secondary | ICD-10-CM | POA: Diagnosis not present

## 2023-04-11 DIAGNOSIS — I6340 Cerebral infarction due to embolism of unspecified cerebral artery: Secondary | ICD-10-CM | POA: Diagnosis not present

## 2023-04-12 ENCOUNTER — Ambulatory Visit: Payer: Self-pay | Admitting: Psychiatry

## 2023-04-12 DIAGNOSIS — I129 Hypertensive chronic kidney disease with stage 1 through stage 4 chronic kidney disease, or unspecified chronic kidney disease: Secondary | ICD-10-CM | POA: Diagnosis not present

## 2023-04-12 DIAGNOSIS — T8740 Infection of amputation stump, unspecified extremity: Secondary | ICD-10-CM | POA: Diagnosis not present

## 2023-04-12 DIAGNOSIS — E039 Hypothyroidism, unspecified: Secondary | ICD-10-CM | POA: Diagnosis not present

## 2023-04-12 DIAGNOSIS — J9601 Acute respiratory failure with hypoxia: Secondary | ICD-10-CM | POA: Diagnosis not present

## 2023-04-12 DIAGNOSIS — R638 Other symptoms and signs concerning food and fluid intake: Secondary | ICD-10-CM | POA: Diagnosis not present

## 2023-04-12 DIAGNOSIS — E1065 Type 1 diabetes mellitus with hyperglycemia: Secondary | ICD-10-CM | POA: Diagnosis not present

## 2023-04-12 DIAGNOSIS — I4891 Unspecified atrial fibrillation: Secondary | ICD-10-CM | POA: Diagnosis not present

## 2023-04-12 DIAGNOSIS — U071 COVID-19: Secondary | ICD-10-CM | POA: Diagnosis not present

## 2023-04-12 DIAGNOSIS — R600 Localized edema: Secondary | ICD-10-CM | POA: Diagnosis not present

## 2023-04-12 DIAGNOSIS — E1022 Type 1 diabetes mellitus with diabetic chronic kidney disease: Secondary | ICD-10-CM | POA: Diagnosis not present

## 2023-04-12 DIAGNOSIS — L089 Local infection of the skin and subcutaneous tissue, unspecified: Secondary | ICD-10-CM | POA: Diagnosis not present

## 2023-04-12 DIAGNOSIS — E669 Obesity, unspecified: Secondary | ICD-10-CM | POA: Diagnosis not present

## 2023-04-12 DIAGNOSIS — T17808A Unspecified foreign body in other parts of respiratory tract causing other injury, initial encounter: Secondary | ICD-10-CM | POA: Diagnosis not present

## 2023-04-12 DIAGNOSIS — Z6835 Body mass index (BMI) 35.0-35.9, adult: Secondary | ICD-10-CM | POA: Diagnosis not present

## 2023-04-12 DIAGNOSIS — E10628 Type 1 diabetes mellitus with other skin complications: Secondary | ICD-10-CM | POA: Diagnosis not present

## 2023-04-12 DIAGNOSIS — E1010 Type 1 diabetes mellitus with ketoacidosis without coma: Secondary | ICD-10-CM | POA: Diagnosis not present

## 2023-04-12 DIAGNOSIS — N189 Chronic kidney disease, unspecified: Secondary | ICD-10-CM | POA: Diagnosis not present

## 2023-04-13 DIAGNOSIS — E10628 Type 1 diabetes mellitus with other skin complications: Secondary | ICD-10-CM | POA: Diagnosis not present

## 2023-04-13 DIAGNOSIS — L089 Local infection of the skin and subcutaneous tissue, unspecified: Secondary | ICD-10-CM | POA: Diagnosis not present

## 2023-04-13 DIAGNOSIS — E10649 Type 1 diabetes mellitus with hypoglycemia without coma: Secondary | ICD-10-CM | POA: Diagnosis not present

## 2023-04-13 DIAGNOSIS — F32A Depression, unspecified: Secondary | ICD-10-CM | POA: Diagnosis not present

## 2023-04-13 DIAGNOSIS — Z6835 Body mass index (BMI) 35.0-35.9, adult: Secondary | ICD-10-CM | POA: Diagnosis not present

## 2023-04-13 DIAGNOSIS — J9601 Acute respiratory failure with hypoxia: Secondary | ICD-10-CM | POA: Diagnosis not present

## 2023-04-13 DIAGNOSIS — E1010 Type 1 diabetes mellitus with ketoacidosis without coma: Secondary | ICD-10-CM | POA: Diagnosis not present

## 2023-04-13 DIAGNOSIS — E1022 Type 1 diabetes mellitus with diabetic chronic kidney disease: Secondary | ICD-10-CM | POA: Diagnosis not present

## 2023-04-13 DIAGNOSIS — T8740 Infection of amputation stump, unspecified extremity: Secondary | ICD-10-CM | POA: Diagnosis not present

## 2023-04-13 DIAGNOSIS — Z8673 Personal history of transient ischemic attack (TIA), and cerebral infarction without residual deficits: Secondary | ICD-10-CM | POA: Diagnosis not present

## 2023-04-13 DIAGNOSIS — L03116 Cellulitis of left lower limb: Secondary | ICD-10-CM | POA: Diagnosis not present

## 2023-04-13 DIAGNOSIS — R638 Other symptoms and signs concerning food and fluid intake: Secondary | ICD-10-CM | POA: Diagnosis not present

## 2023-04-13 DIAGNOSIS — R451 Restlessness and agitation: Secondary | ICD-10-CM | POA: Diagnosis not present

## 2023-04-13 DIAGNOSIS — G479 Sleep disorder, unspecified: Secondary | ICD-10-CM | POA: Diagnosis not present

## 2023-04-13 DIAGNOSIS — R410 Disorientation, unspecified: Secondary | ICD-10-CM | POA: Diagnosis not present

## 2023-04-13 DIAGNOSIS — E669 Obesity, unspecified: Secondary | ICD-10-CM | POA: Diagnosis not present

## 2023-04-13 DIAGNOSIS — F05 Delirium due to known physiological condition: Secondary | ICD-10-CM | POA: Diagnosis not present

## 2023-04-13 DIAGNOSIS — I4891 Unspecified atrial fibrillation: Secondary | ICD-10-CM | POA: Diagnosis not present

## 2023-04-13 DIAGNOSIS — R0600 Dyspnea, unspecified: Secondary | ICD-10-CM | POA: Diagnosis not present

## 2023-04-13 DIAGNOSIS — E1065 Type 1 diabetes mellitus with hyperglycemia: Secondary | ICD-10-CM | POA: Diagnosis not present

## 2023-04-13 DIAGNOSIS — E039 Hypothyroidism, unspecified: Secondary | ICD-10-CM | POA: Diagnosis not present

## 2023-04-13 DIAGNOSIS — U071 COVID-19: Secondary | ICD-10-CM | POA: Diagnosis not present

## 2023-04-13 DIAGNOSIS — R600 Localized edema: Secondary | ICD-10-CM | POA: Diagnosis not present

## 2023-04-14 DIAGNOSIS — G479 Sleep disorder, unspecified: Secondary | ICD-10-CM | POA: Diagnosis not present

## 2023-04-14 DIAGNOSIS — L089 Local infection of the skin and subcutaneous tissue, unspecified: Secondary | ICD-10-CM | POA: Diagnosis not present

## 2023-04-14 DIAGNOSIS — E10628 Type 1 diabetes mellitus with other skin complications: Secondary | ICD-10-CM | POA: Diagnosis not present

## 2023-04-14 DIAGNOSIS — J9601 Acute respiratory failure with hypoxia: Secondary | ICD-10-CM | POA: Diagnosis not present

## 2023-04-14 DIAGNOSIS — Z79899 Other long term (current) drug therapy: Secondary | ICD-10-CM | POA: Diagnosis not present

## 2023-04-14 DIAGNOSIS — Z8673 Personal history of transient ischemic attack (TIA), and cerebral infarction without residual deficits: Secondary | ICD-10-CM | POA: Diagnosis not present

## 2023-04-14 DIAGNOSIS — R410 Disorientation, unspecified: Secondary | ICD-10-CM | POA: Diagnosis not present

## 2023-04-14 DIAGNOSIS — T8740 Infection of amputation stump, unspecified extremity: Secondary | ICD-10-CM | POA: Diagnosis not present

## 2023-04-14 DIAGNOSIS — E109 Type 1 diabetes mellitus without complications: Secondary | ICD-10-CM | POA: Diagnosis not present

## 2023-04-14 DIAGNOSIS — E1022 Type 1 diabetes mellitus with diabetic chronic kidney disease: Secondary | ICD-10-CM | POA: Diagnosis not present

## 2023-04-14 DIAGNOSIS — E1065 Type 1 diabetes mellitus with hyperglycemia: Secondary | ICD-10-CM | POA: Diagnosis not present

## 2023-04-14 DIAGNOSIS — N189 Chronic kidney disease, unspecified: Secondary | ICD-10-CM | POA: Diagnosis not present

## 2023-04-14 DIAGNOSIS — F32A Depression, unspecified: Secondary | ICD-10-CM | POA: Diagnosis not present

## 2023-04-14 DIAGNOSIS — E039 Hypothyroidism, unspecified: Secondary | ICD-10-CM | POA: Diagnosis not present

## 2023-04-14 DIAGNOSIS — F05 Delirium due to known physiological condition: Secondary | ICD-10-CM | POA: Diagnosis not present

## 2023-04-14 DIAGNOSIS — R600 Localized edema: Secondary | ICD-10-CM | POA: Diagnosis not present

## 2023-04-14 DIAGNOSIS — R638 Other symptoms and signs concerning food and fluid intake: Secondary | ICD-10-CM | POA: Diagnosis not present

## 2023-04-14 DIAGNOSIS — I4891 Unspecified atrial fibrillation: Secondary | ICD-10-CM | POA: Diagnosis not present

## 2023-04-14 DIAGNOSIS — I129 Hypertensive chronic kidney disease with stage 1 through stage 4 chronic kidney disease, or unspecified chronic kidney disease: Secondary | ICD-10-CM | POA: Diagnosis not present

## 2023-04-14 DIAGNOSIS — E1010 Type 1 diabetes mellitus with ketoacidosis without coma: Secondary | ICD-10-CM | POA: Diagnosis not present

## 2023-04-14 DIAGNOSIS — U071 COVID-19: Secondary | ICD-10-CM | POA: Diagnosis not present

## 2023-04-15 DIAGNOSIS — T8740 Infection of amputation stump, unspecified extremity: Secondary | ICD-10-CM | POA: Diagnosis not present

## 2023-04-15 DIAGNOSIS — E1010 Type 1 diabetes mellitus with ketoacidosis without coma: Secondary | ICD-10-CM | POA: Diagnosis not present

## 2023-04-15 DIAGNOSIS — R600 Localized edema: Secondary | ICD-10-CM | POA: Diagnosis not present

## 2023-04-15 DIAGNOSIS — I4891 Unspecified atrial fibrillation: Secondary | ICD-10-CM | POA: Diagnosis not present

## 2023-04-15 DIAGNOSIS — F05 Delirium due to known physiological condition: Secondary | ICD-10-CM | POA: Diagnosis not present

## 2023-04-15 DIAGNOSIS — L03116 Cellulitis of left lower limb: Secondary | ICD-10-CM | POA: Diagnosis not present

## 2023-04-15 DIAGNOSIS — J9601 Acute respiratory failure with hypoxia: Secondary | ICD-10-CM | POA: Diagnosis not present

## 2023-04-15 DIAGNOSIS — E1029 Type 1 diabetes mellitus with other diabetic kidney complication: Secondary | ICD-10-CM | POA: Diagnosis not present

## 2023-04-16 DIAGNOSIS — G479 Sleep disorder, unspecified: Secondary | ICD-10-CM | POA: Diagnosis not present

## 2023-04-16 DIAGNOSIS — E1010 Type 1 diabetes mellitus with ketoacidosis without coma: Secondary | ICD-10-CM | POA: Diagnosis not present

## 2023-04-16 DIAGNOSIS — R600 Localized edema: Secondary | ICD-10-CM | POA: Diagnosis not present

## 2023-04-16 DIAGNOSIS — F39 Unspecified mood [affective] disorder: Secondary | ICD-10-CM | POA: Diagnosis not present

## 2023-04-16 DIAGNOSIS — R451 Restlessness and agitation: Secondary | ICD-10-CM | POA: Diagnosis not present

## 2023-04-16 DIAGNOSIS — J9601 Acute respiratory failure with hypoxia: Secondary | ICD-10-CM | POA: Diagnosis not present

## 2023-04-16 DIAGNOSIS — U071 COVID-19: Secondary | ICD-10-CM | POA: Diagnosis not present

## 2023-04-16 DIAGNOSIS — T8740 Infection of amputation stump, unspecified extremity: Secondary | ICD-10-CM | POA: Diagnosis not present

## 2023-04-16 DIAGNOSIS — R410 Disorientation, unspecified: Secondary | ICD-10-CM | POA: Diagnosis not present

## 2023-04-17 DIAGNOSIS — R410 Disorientation, unspecified: Secondary | ICD-10-CM | POA: Diagnosis not present

## 2023-04-17 DIAGNOSIS — J9601 Acute respiratory failure with hypoxia: Secondary | ICD-10-CM | POA: Diagnosis not present

## 2023-04-17 DIAGNOSIS — E1010 Type 1 diabetes mellitus with ketoacidosis without coma: Secondary | ICD-10-CM | POA: Diagnosis not present

## 2023-04-17 DIAGNOSIS — R600 Localized edema: Secondary | ICD-10-CM | POA: Diagnosis not present

## 2023-04-17 DIAGNOSIS — U071 COVID-19: Secondary | ICD-10-CM | POA: Diagnosis not present

## 2023-04-17 DIAGNOSIS — T8740 Infection of amputation stump, unspecified extremity: Secondary | ICD-10-CM | POA: Diagnosis not present

## 2023-04-18 DIAGNOSIS — E669 Obesity, unspecified: Secondary | ICD-10-CM | POA: Diagnosis not present

## 2023-04-18 DIAGNOSIS — F419 Anxiety disorder, unspecified: Secondary | ICD-10-CM | POA: Diagnosis not present

## 2023-04-18 DIAGNOSIS — E039 Hypothyroidism, unspecified: Secondary | ICD-10-CM | POA: Diagnosis not present

## 2023-04-18 DIAGNOSIS — N189 Chronic kidney disease, unspecified: Secondary | ICD-10-CM | POA: Diagnosis not present

## 2023-04-18 DIAGNOSIS — T8740 Infection of amputation stump, unspecified extremity: Secondary | ICD-10-CM | POA: Diagnosis not present

## 2023-04-18 DIAGNOSIS — I129 Hypertensive chronic kidney disease with stage 1 through stage 4 chronic kidney disease, or unspecified chronic kidney disease: Secondary | ICD-10-CM | POA: Diagnosis not present

## 2023-04-18 DIAGNOSIS — E1022 Type 1 diabetes mellitus with diabetic chronic kidney disease: Secondary | ICD-10-CM | POA: Diagnosis not present

## 2023-04-18 DIAGNOSIS — Z6835 Body mass index (BMI) 35.0-35.9, adult: Secondary | ICD-10-CM | POA: Diagnosis not present

## 2023-04-18 DIAGNOSIS — G479 Sleep disorder, unspecified: Secondary | ICD-10-CM | POA: Diagnosis not present

## 2023-04-18 DIAGNOSIS — Z79899 Other long term (current) drug therapy: Secondary | ICD-10-CM | POA: Diagnosis not present

## 2023-04-18 DIAGNOSIS — J9601 Acute respiratory failure with hypoxia: Secondary | ICD-10-CM | POA: Diagnosis not present

## 2023-04-18 DIAGNOSIS — R410 Disorientation, unspecified: Secondary | ICD-10-CM | POA: Diagnosis not present

## 2023-04-18 DIAGNOSIS — E1010 Type 1 diabetes mellitus with ketoacidosis without coma: Secondary | ICD-10-CM | POA: Diagnosis not present

## 2023-04-18 DIAGNOSIS — E1065 Type 1 diabetes mellitus with hyperglycemia: Secondary | ICD-10-CM | POA: Diagnosis not present

## 2023-04-18 DIAGNOSIS — F39 Unspecified mood [affective] disorder: Secondary | ICD-10-CM | POA: Diagnosis not present

## 2023-04-18 DIAGNOSIS — U071 COVID-19: Secondary | ICD-10-CM | POA: Diagnosis not present

## 2023-04-18 DIAGNOSIS — R600 Localized edema: Secondary | ICD-10-CM | POA: Diagnosis not present

## 2023-04-19 DIAGNOSIS — E1022 Type 1 diabetes mellitus with diabetic chronic kidney disease: Secondary | ICD-10-CM | POA: Diagnosis not present

## 2023-04-19 DIAGNOSIS — I129 Hypertensive chronic kidney disease with stage 1 through stage 4 chronic kidney disease, or unspecified chronic kidney disease: Secondary | ICD-10-CM | POA: Diagnosis not present

## 2023-04-19 DIAGNOSIS — T8789 Other complications of amputation stump: Secondary | ICD-10-CM | POA: Diagnosis not present

## 2023-04-19 DIAGNOSIS — N179 Acute kidney failure, unspecified: Secondary | ICD-10-CM | POA: Diagnosis not present

## 2023-04-19 DIAGNOSIS — J9601 Acute respiratory failure with hypoxia: Secondary | ICD-10-CM | POA: Diagnosis not present

## 2023-04-19 DIAGNOSIS — E669 Obesity, unspecified: Secondary | ICD-10-CM | POA: Diagnosis not present

## 2023-04-19 DIAGNOSIS — Z89512 Acquired absence of left leg below knee: Secondary | ICD-10-CM | POA: Diagnosis not present

## 2023-04-19 DIAGNOSIS — N189 Chronic kidney disease, unspecified: Secondary | ICD-10-CM | POA: Diagnosis not present

## 2023-04-19 DIAGNOSIS — I639 Cerebral infarction, unspecified: Secondary | ICD-10-CM | POA: Diagnosis not present

## 2023-04-19 DIAGNOSIS — E1010 Type 1 diabetes mellitus with ketoacidosis without coma: Secondary | ICD-10-CM | POA: Diagnosis not present

## 2023-04-19 DIAGNOSIS — R600 Localized edema: Secondary | ICD-10-CM | POA: Diagnosis not present

## 2023-04-19 DIAGNOSIS — T8740 Infection of amputation stump, unspecified extremity: Secondary | ICD-10-CM | POA: Diagnosis not present

## 2023-04-19 DIAGNOSIS — Z6832 Body mass index (BMI) 32.0-32.9, adult: Secondary | ICD-10-CM | POA: Diagnosis not present

## 2023-04-19 DIAGNOSIS — E10628 Type 1 diabetes mellitus with other skin complications: Secondary | ICD-10-CM | POA: Diagnosis not present

## 2023-04-19 DIAGNOSIS — R410 Disorientation, unspecified: Secondary | ICD-10-CM | POA: Diagnosis not present

## 2023-04-19 DIAGNOSIS — I10 Essential (primary) hypertension: Secondary | ICD-10-CM | POA: Diagnosis not present

## 2023-04-19 DIAGNOSIS — J386 Stenosis of larynx: Secondary | ICD-10-CM | POA: Diagnosis not present

## 2023-04-19 DIAGNOSIS — E1069 Type 1 diabetes mellitus with other specified complication: Secondary | ICD-10-CM | POA: Diagnosis not present

## 2023-04-19 DIAGNOSIS — E039 Hypothyroidism, unspecified: Secondary | ICD-10-CM | POA: Diagnosis not present

## 2023-04-19 DIAGNOSIS — S81802A Unspecified open wound, left lower leg, initial encounter: Secondary | ICD-10-CM | POA: Diagnosis not present

## 2023-04-19 DIAGNOSIS — T8744 Infection of amputation stump, left lower extremity: Secondary | ICD-10-CM | POA: Diagnosis not present

## 2023-04-20 DIAGNOSIS — E1022 Type 1 diabetes mellitus with diabetic chronic kidney disease: Secondary | ICD-10-CM | POA: Diagnosis not present

## 2023-04-20 DIAGNOSIS — T8740 Infection of amputation stump, unspecified extremity: Secondary | ICD-10-CM | POA: Diagnosis not present

## 2023-04-20 DIAGNOSIS — L03116 Cellulitis of left lower limb: Secondary | ICD-10-CM | POA: Diagnosis not present

## 2023-04-20 DIAGNOSIS — G479 Sleep disorder, unspecified: Secondary | ICD-10-CM | POA: Diagnosis not present

## 2023-04-20 DIAGNOSIS — E1010 Type 1 diabetes mellitus with ketoacidosis without coma: Secondary | ICD-10-CM | POA: Diagnosis not present

## 2023-04-20 DIAGNOSIS — J9601 Acute respiratory failure with hypoxia: Secondary | ICD-10-CM | POA: Diagnosis not present

## 2023-04-20 DIAGNOSIS — E1065 Type 1 diabetes mellitus with hyperglycemia: Secondary | ICD-10-CM | POA: Diagnosis not present

## 2023-04-20 DIAGNOSIS — N189 Chronic kidney disease, unspecified: Secondary | ICD-10-CM | POA: Diagnosis not present

## 2023-04-20 DIAGNOSIS — I129 Hypertensive chronic kidney disease with stage 1 through stage 4 chronic kidney disease, or unspecified chronic kidney disease: Secondary | ICD-10-CM | POA: Diagnosis not present

## 2023-04-20 DIAGNOSIS — Z8673 Personal history of transient ischemic attack (TIA), and cerebral infarction without residual deficits: Secondary | ICD-10-CM | POA: Diagnosis not present

## 2023-04-20 DIAGNOSIS — E039 Hypothyroidism, unspecified: Secondary | ICD-10-CM | POA: Diagnosis not present

## 2023-04-20 DIAGNOSIS — R410 Disorientation, unspecified: Secondary | ICD-10-CM | POA: Diagnosis not present

## 2023-04-20 DIAGNOSIS — F39 Unspecified mood [affective] disorder: Secondary | ICD-10-CM | POA: Diagnosis not present

## 2023-04-20 DIAGNOSIS — Z89511 Acquired absence of right leg below knee: Secondary | ICD-10-CM | POA: Diagnosis not present

## 2023-04-20 DIAGNOSIS — E669 Obesity, unspecified: Secondary | ICD-10-CM | POA: Diagnosis not present

## 2023-04-20 DIAGNOSIS — Z794 Long term (current) use of insulin: Secondary | ICD-10-CM | POA: Diagnosis not present

## 2023-04-20 DIAGNOSIS — I10 Essential (primary) hypertension: Secondary | ICD-10-CM | POA: Diagnosis not present

## 2023-04-20 DIAGNOSIS — R600 Localized edema: Secondary | ICD-10-CM | POA: Diagnosis not present

## 2023-04-20 DIAGNOSIS — F419 Anxiety disorder, unspecified: Secondary | ICD-10-CM | POA: Diagnosis not present

## 2023-04-21 DIAGNOSIS — I10 Essential (primary) hypertension: Secondary | ICD-10-CM | POA: Diagnosis not present

## 2023-04-21 DIAGNOSIS — T8740 Infection of amputation stump, unspecified extremity: Secondary | ICD-10-CM | POA: Diagnosis not present

## 2023-04-21 DIAGNOSIS — L03116 Cellulitis of left lower limb: Secondary | ICD-10-CM | POA: Diagnosis not present

## 2023-04-21 DIAGNOSIS — E039 Hypothyroidism, unspecified: Secondary | ICD-10-CM | POA: Diagnosis not present

## 2023-04-21 DIAGNOSIS — Z89512 Acquired absence of left leg below knee: Secondary | ICD-10-CM | POA: Diagnosis not present

## 2023-04-21 DIAGNOSIS — E1022 Type 1 diabetes mellitus with diabetic chronic kidney disease: Secondary | ICD-10-CM | POA: Diagnosis not present

## 2023-04-21 DIAGNOSIS — J9601 Acute respiratory failure with hypoxia: Secondary | ICD-10-CM | POA: Diagnosis not present

## 2023-04-21 DIAGNOSIS — Z6832 Body mass index (BMI) 32.0-32.9, adult: Secondary | ICD-10-CM | POA: Diagnosis not present

## 2023-04-21 DIAGNOSIS — E1069 Type 1 diabetes mellitus with other specified complication: Secondary | ICD-10-CM | POA: Diagnosis not present

## 2023-04-21 DIAGNOSIS — T8744 Infection of amputation stump, left lower extremity: Secondary | ICD-10-CM | POA: Diagnosis not present

## 2023-04-21 DIAGNOSIS — N189 Chronic kidney disease, unspecified: Secondary | ICD-10-CM | POA: Diagnosis not present

## 2023-04-21 DIAGNOSIS — E1010 Type 1 diabetes mellitus with ketoacidosis without coma: Secondary | ICD-10-CM | POA: Diagnosis not present

## 2023-04-21 DIAGNOSIS — J386 Stenosis of larynx: Secondary | ICD-10-CM | POA: Diagnosis not present

## 2023-04-21 DIAGNOSIS — I129 Hypertensive chronic kidney disease with stage 1 through stage 4 chronic kidney disease, or unspecified chronic kidney disease: Secondary | ICD-10-CM | POA: Diagnosis not present

## 2023-04-21 DIAGNOSIS — R600 Localized edema: Secondary | ICD-10-CM | POA: Diagnosis not present

## 2023-04-21 DIAGNOSIS — R410 Disorientation, unspecified: Secondary | ICD-10-CM | POA: Diagnosis not present

## 2023-04-21 DIAGNOSIS — I639 Cerebral infarction, unspecified: Secondary | ICD-10-CM | POA: Diagnosis not present

## 2023-04-21 DIAGNOSIS — E669 Obesity, unspecified: Secondary | ICD-10-CM | POA: Diagnosis not present

## 2023-04-21 DIAGNOSIS — N179 Acute kidney failure, unspecified: Secondary | ICD-10-CM | POA: Diagnosis not present

## 2023-04-22 DIAGNOSIS — N179 Acute kidney failure, unspecified: Secondary | ICD-10-CM | POA: Diagnosis not present

## 2023-04-22 DIAGNOSIS — J9601 Acute respiratory failure with hypoxia: Secondary | ICD-10-CM | POA: Diagnosis not present

## 2023-04-22 DIAGNOSIS — I129 Hypertensive chronic kidney disease with stage 1 through stage 4 chronic kidney disease, or unspecified chronic kidney disease: Secondary | ICD-10-CM | POA: Diagnosis not present

## 2023-04-22 DIAGNOSIS — E1022 Type 1 diabetes mellitus with diabetic chronic kidney disease: Secondary | ICD-10-CM | POA: Diagnosis not present

## 2023-04-22 DIAGNOSIS — J386 Stenosis of larynx: Secondary | ICD-10-CM | POA: Diagnosis not present

## 2023-04-22 DIAGNOSIS — E1010 Type 1 diabetes mellitus with ketoacidosis without coma: Secondary | ICD-10-CM | POA: Diagnosis not present

## 2023-04-22 DIAGNOSIS — E039 Hypothyroidism, unspecified: Secondary | ICD-10-CM | POA: Diagnosis not present

## 2023-04-22 DIAGNOSIS — E669 Obesity, unspecified: Secondary | ICD-10-CM | POA: Diagnosis not present

## 2023-04-22 DIAGNOSIS — I639 Cerebral infarction, unspecified: Secondary | ICD-10-CM | POA: Diagnosis not present

## 2023-04-22 DIAGNOSIS — T8740 Infection of amputation stump, unspecified extremity: Secondary | ICD-10-CM | POA: Diagnosis not present

## 2023-04-22 DIAGNOSIS — E1069 Type 1 diabetes mellitus with other specified complication: Secondary | ICD-10-CM | POA: Diagnosis not present

## 2023-04-22 DIAGNOSIS — I10 Essential (primary) hypertension: Secondary | ICD-10-CM | POA: Diagnosis not present

## 2023-04-22 DIAGNOSIS — R410 Disorientation, unspecified: Secondary | ICD-10-CM | POA: Diagnosis not present

## 2023-04-22 DIAGNOSIS — Z89512 Acquired absence of left leg below knee: Secondary | ICD-10-CM | POA: Diagnosis not present

## 2023-04-22 DIAGNOSIS — T8744 Infection of amputation stump, left lower extremity: Secondary | ICD-10-CM | POA: Diagnosis not present

## 2023-04-22 DIAGNOSIS — Z6832 Body mass index (BMI) 32.0-32.9, adult: Secondary | ICD-10-CM | POA: Diagnosis not present

## 2023-04-22 DIAGNOSIS — N189 Chronic kidney disease, unspecified: Secondary | ICD-10-CM | POA: Diagnosis not present

## 2023-04-22 DIAGNOSIS — R600 Localized edema: Secondary | ICD-10-CM | POA: Diagnosis not present

## 2023-04-22 DIAGNOSIS — L03116 Cellulitis of left lower limb: Secondary | ICD-10-CM | POA: Diagnosis not present

## 2023-04-23 DIAGNOSIS — I639 Cerebral infarction, unspecified: Secondary | ICD-10-CM | POA: Diagnosis not present

## 2023-04-23 DIAGNOSIS — J386 Stenosis of larynx: Secondary | ICD-10-CM | POA: Diagnosis not present

## 2023-04-23 DIAGNOSIS — E039 Hypothyroidism, unspecified: Secondary | ICD-10-CM | POA: Diagnosis not present

## 2023-04-23 DIAGNOSIS — Z89512 Acquired absence of left leg below knee: Secondary | ICD-10-CM | POA: Diagnosis not present

## 2023-04-23 DIAGNOSIS — E1010 Type 1 diabetes mellitus with ketoacidosis without coma: Secondary | ICD-10-CM | POA: Diagnosis not present

## 2023-04-23 DIAGNOSIS — E1022 Type 1 diabetes mellitus with diabetic chronic kidney disease: Secondary | ICD-10-CM | POA: Diagnosis not present

## 2023-04-23 DIAGNOSIS — Z6832 Body mass index (BMI) 32.0-32.9, adult: Secondary | ICD-10-CM | POA: Diagnosis not present

## 2023-04-23 DIAGNOSIS — F05 Delirium due to known physiological condition: Secondary | ICD-10-CM | POA: Diagnosis not present

## 2023-04-23 DIAGNOSIS — R600 Localized edema: Secondary | ICD-10-CM | POA: Diagnosis not present

## 2023-04-23 DIAGNOSIS — E1069 Type 1 diabetes mellitus with other specified complication: Secondary | ICD-10-CM | POA: Diagnosis not present

## 2023-04-23 DIAGNOSIS — I129 Hypertensive chronic kidney disease with stage 1 through stage 4 chronic kidney disease, or unspecified chronic kidney disease: Secondary | ICD-10-CM | POA: Diagnosis not present

## 2023-04-23 DIAGNOSIS — N189 Chronic kidney disease, unspecified: Secondary | ICD-10-CM | POA: Diagnosis not present

## 2023-04-23 DIAGNOSIS — N179 Acute kidney failure, unspecified: Secondary | ICD-10-CM | POA: Diagnosis not present

## 2023-04-23 DIAGNOSIS — J9601 Acute respiratory failure with hypoxia: Secondary | ICD-10-CM | POA: Diagnosis not present

## 2023-04-23 DIAGNOSIS — E669 Obesity, unspecified: Secondary | ICD-10-CM | POA: Diagnosis not present

## 2023-04-23 DIAGNOSIS — I10 Essential (primary) hypertension: Secondary | ICD-10-CM | POA: Diagnosis not present

## 2023-04-23 DIAGNOSIS — T8744 Infection of amputation stump, left lower extremity: Secondary | ICD-10-CM | POA: Diagnosis not present

## 2023-04-24 DIAGNOSIS — T8740 Infection of amputation stump, unspecified extremity: Secondary | ICD-10-CM | POA: Diagnosis not present

## 2023-04-24 DIAGNOSIS — I10 Essential (primary) hypertension: Secondary | ICD-10-CM | POA: Diagnosis not present

## 2023-04-24 DIAGNOSIS — J386 Stenosis of larynx: Secondary | ICD-10-CM | POA: Diagnosis not present

## 2023-04-24 DIAGNOSIS — N179 Acute kidney failure, unspecified: Secondary | ICD-10-CM | POA: Diagnosis not present

## 2023-04-24 DIAGNOSIS — E1069 Type 1 diabetes mellitus with other specified complication: Secondary | ICD-10-CM | POA: Diagnosis not present

## 2023-04-24 DIAGNOSIS — R600 Localized edema: Secondary | ICD-10-CM | POA: Diagnosis not present

## 2023-04-24 DIAGNOSIS — T8744 Infection of amputation stump, left lower extremity: Secondary | ICD-10-CM | POA: Diagnosis not present

## 2023-04-24 DIAGNOSIS — E669 Obesity, unspecified: Secondary | ICD-10-CM | POA: Diagnosis not present

## 2023-04-24 DIAGNOSIS — F419 Anxiety disorder, unspecified: Secondary | ICD-10-CM | POA: Diagnosis not present

## 2023-04-24 DIAGNOSIS — J9601 Acute respiratory failure with hypoxia: Secondary | ICD-10-CM | POA: Diagnosis not present

## 2023-04-24 DIAGNOSIS — E039 Hypothyroidism, unspecified: Secondary | ICD-10-CM | POA: Diagnosis not present

## 2023-04-24 DIAGNOSIS — E1010 Type 1 diabetes mellitus with ketoacidosis without coma: Secondary | ICD-10-CM | POA: Diagnosis not present

## 2023-04-24 DIAGNOSIS — F05 Delirium due to known physiological condition: Secondary | ICD-10-CM | POA: Diagnosis not present

## 2023-04-24 DIAGNOSIS — I639 Cerebral infarction, unspecified: Secondary | ICD-10-CM | POA: Diagnosis not present

## 2023-04-24 DIAGNOSIS — F32A Depression, unspecified: Secondary | ICD-10-CM | POA: Diagnosis not present

## 2023-04-24 DIAGNOSIS — Z6832 Body mass index (BMI) 32.0-32.9, adult: Secondary | ICD-10-CM | POA: Diagnosis not present

## 2023-04-24 DIAGNOSIS — Z89512 Acquired absence of left leg below knee: Secondary | ICD-10-CM | POA: Diagnosis not present

## 2023-04-24 DIAGNOSIS — I129 Hypertensive chronic kidney disease with stage 1 through stage 4 chronic kidney disease, or unspecified chronic kidney disease: Secondary | ICD-10-CM | POA: Diagnosis not present

## 2023-04-24 DIAGNOSIS — N189 Chronic kidney disease, unspecified: Secondary | ICD-10-CM | POA: Diagnosis not present

## 2023-04-24 DIAGNOSIS — E1022 Type 1 diabetes mellitus with diabetic chronic kidney disease: Secondary | ICD-10-CM | POA: Diagnosis not present

## 2023-04-25 DIAGNOSIS — E039 Hypothyroidism, unspecified: Secondary | ICD-10-CM | POA: Diagnosis not present

## 2023-04-25 DIAGNOSIS — I639 Cerebral infarction, unspecified: Secondary | ICD-10-CM | POA: Diagnosis not present

## 2023-04-25 DIAGNOSIS — N179 Acute kidney failure, unspecified: Secondary | ICD-10-CM | POA: Diagnosis not present

## 2023-04-25 DIAGNOSIS — E669 Obesity, unspecified: Secondary | ICD-10-CM | POA: Diagnosis not present

## 2023-04-25 DIAGNOSIS — R600 Localized edema: Secondary | ICD-10-CM | POA: Diagnosis not present

## 2023-04-25 DIAGNOSIS — E1022 Type 1 diabetes mellitus with diabetic chronic kidney disease: Secondary | ICD-10-CM | POA: Diagnosis not present

## 2023-04-25 DIAGNOSIS — N189 Chronic kidney disease, unspecified: Secondary | ICD-10-CM | POA: Diagnosis not present

## 2023-04-25 DIAGNOSIS — F05 Delirium due to known physiological condition: Secondary | ICD-10-CM | POA: Diagnosis not present

## 2023-04-25 DIAGNOSIS — Z6832 Body mass index (BMI) 32.0-32.9, adult: Secondary | ICD-10-CM | POA: Diagnosis not present

## 2023-04-25 DIAGNOSIS — I10 Essential (primary) hypertension: Secondary | ICD-10-CM | POA: Diagnosis not present

## 2023-04-25 DIAGNOSIS — Z89512 Acquired absence of left leg below knee: Secondary | ICD-10-CM | POA: Diagnosis not present

## 2023-04-25 DIAGNOSIS — Z7409 Other reduced mobility: Secondary | ICD-10-CM | POA: Diagnosis not present

## 2023-04-25 DIAGNOSIS — E1010 Type 1 diabetes mellitus with ketoacidosis without coma: Secondary | ICD-10-CM | POA: Diagnosis not present

## 2023-04-25 DIAGNOSIS — I129 Hypertensive chronic kidney disease with stage 1 through stage 4 chronic kidney disease, or unspecified chronic kidney disease: Secondary | ICD-10-CM | POA: Diagnosis not present

## 2023-04-25 DIAGNOSIS — T8744 Infection of amputation stump, left lower extremity: Secondary | ICD-10-CM | POA: Diagnosis not present

## 2023-04-25 DIAGNOSIS — J386 Stenosis of larynx: Secondary | ICD-10-CM | POA: Diagnosis not present

## 2023-04-25 DIAGNOSIS — E1069 Type 1 diabetes mellitus with other specified complication: Secondary | ICD-10-CM | POA: Diagnosis not present

## 2023-04-26 DIAGNOSIS — N189 Chronic kidney disease, unspecified: Secondary | ICD-10-CM | POA: Diagnosis not present

## 2023-04-26 DIAGNOSIS — T8744 Infection of amputation stump, left lower extremity: Secondary | ICD-10-CM | POA: Diagnosis not present

## 2023-04-26 DIAGNOSIS — Z9002 Acquired absence of larynx: Secondary | ICD-10-CM | POA: Diagnosis not present

## 2023-04-26 DIAGNOSIS — J9620 Acute and chronic respiratory failure, unspecified whether with hypoxia or hypercapnia: Secondary | ICD-10-CM | POA: Diagnosis not present

## 2023-04-26 DIAGNOSIS — E039 Hypothyroidism, unspecified: Secondary | ICD-10-CM | POA: Diagnosis not present

## 2023-04-26 DIAGNOSIS — Z6832 Body mass index (BMI) 32.0-32.9, adult: Secondary | ICD-10-CM | POA: Diagnosis not present

## 2023-04-26 DIAGNOSIS — N179 Acute kidney failure, unspecified: Secondary | ICD-10-CM | POA: Diagnosis not present

## 2023-04-26 DIAGNOSIS — J386 Stenosis of larynx: Secondary | ICD-10-CM | POA: Diagnosis not present

## 2023-04-26 DIAGNOSIS — E1069 Type 1 diabetes mellitus with other specified complication: Secondary | ICD-10-CM | POA: Diagnosis not present

## 2023-04-26 DIAGNOSIS — I129 Hypertensive chronic kidney disease with stage 1 through stage 4 chronic kidney disease, or unspecified chronic kidney disease: Secondary | ICD-10-CM | POA: Diagnosis not present

## 2023-04-26 DIAGNOSIS — Z930 Tracheostomy status: Secondary | ICD-10-CM | POA: Diagnosis not present

## 2023-04-26 DIAGNOSIS — E669 Obesity, unspecified: Secondary | ICD-10-CM | POA: Diagnosis not present

## 2023-04-26 DIAGNOSIS — I639 Cerebral infarction, unspecified: Secondary | ICD-10-CM | POA: Diagnosis not present

## 2023-04-26 DIAGNOSIS — E1022 Type 1 diabetes mellitus with diabetic chronic kidney disease: Secondary | ICD-10-CM | POA: Diagnosis not present

## 2023-04-26 DIAGNOSIS — R220 Localized swelling, mass and lump, head: Secondary | ICD-10-CM | POA: Diagnosis not present

## 2023-04-26 DIAGNOSIS — Z89512 Acquired absence of left leg below knee: Secondary | ICD-10-CM | POA: Diagnosis not present

## 2023-04-26 DIAGNOSIS — T8740 Infection of amputation stump, unspecified extremity: Secondary | ICD-10-CM | POA: Diagnosis not present

## 2023-04-27 DIAGNOSIS — Z89512 Acquired absence of left leg below knee: Secondary | ICD-10-CM | POA: Diagnosis not present

## 2023-04-27 DIAGNOSIS — Z930 Tracheostomy status: Secondary | ICD-10-CM | POA: Diagnosis not present

## 2023-04-27 DIAGNOSIS — E1029 Type 1 diabetes mellitus with other diabetic kidney complication: Secondary | ICD-10-CM | POA: Diagnosis not present

## 2023-04-27 DIAGNOSIS — I639 Cerebral infarction, unspecified: Secondary | ICD-10-CM | POA: Diagnosis not present

## 2023-04-27 DIAGNOSIS — Z9002 Acquired absence of larynx: Secondary | ICD-10-CM | POA: Diagnosis not present

## 2023-04-27 DIAGNOSIS — R220 Localized swelling, mass and lump, head: Secondary | ICD-10-CM | POA: Diagnosis not present

## 2023-04-27 DIAGNOSIS — J9620 Acute and chronic respiratory failure, unspecified whether with hypoxia or hypercapnia: Secondary | ICD-10-CM | POA: Diagnosis not present

## 2023-04-28 DIAGNOSIS — Z89512 Acquired absence of left leg below knee: Secondary | ICD-10-CM | POA: Diagnosis not present

## 2023-04-28 DIAGNOSIS — E119 Type 2 diabetes mellitus without complications: Secondary | ICD-10-CM | POA: Diagnosis not present

## 2023-04-28 DIAGNOSIS — I480 Paroxysmal atrial fibrillation: Secondary | ICD-10-CM | POA: Diagnosis not present

## 2023-05-03 DIAGNOSIS — I152 Hypertension secondary to endocrine disorders: Secondary | ICD-10-CM | POA: Diagnosis not present

## 2023-05-03 DIAGNOSIS — E1159 Type 2 diabetes mellitus with other circulatory complications: Secondary | ICD-10-CM | POA: Diagnosis not present

## 2023-05-03 DIAGNOSIS — E039 Hypothyroidism, unspecified: Secondary | ICD-10-CM | POA: Diagnosis not present

## 2023-05-03 DIAGNOSIS — L03116 Cellulitis of left lower limb: Secondary | ICD-10-CM | POA: Diagnosis not present

## 2023-05-03 DIAGNOSIS — E1029 Type 1 diabetes mellitus with other diabetic kidney complication: Secondary | ICD-10-CM | POA: Diagnosis not present

## 2023-05-03 DIAGNOSIS — R809 Proteinuria, unspecified: Secondary | ICD-10-CM | POA: Diagnosis not present

## 2023-05-03 DIAGNOSIS — E785 Hyperlipidemia, unspecified: Secondary | ICD-10-CM | POA: Diagnosis not present

## 2023-05-03 DIAGNOSIS — Z430 Encounter for attention to tracheostomy: Secondary | ICD-10-CM | POA: Diagnosis not present

## 2023-05-03 DIAGNOSIS — E1069 Type 1 diabetes mellitus with other specified complication: Secondary | ICD-10-CM | POA: Diagnosis not present

## 2023-05-04 DIAGNOSIS — J9600 Acute respiratory failure, unspecified whether with hypoxia or hypercapnia: Secondary | ICD-10-CM | POA: Diagnosis not present

## 2023-05-04 DIAGNOSIS — Z7901 Long term (current) use of anticoagulants: Secondary | ICD-10-CM | POA: Diagnosis not present

## 2023-05-04 DIAGNOSIS — I10 Essential (primary) hypertension: Secondary | ICD-10-CM | POA: Diagnosis not present

## 2023-05-04 DIAGNOSIS — T8781 Dehiscence of amputation stump: Secondary | ICD-10-CM | POA: Diagnosis not present

## 2023-05-04 DIAGNOSIS — E1010 Type 1 diabetes mellitus with ketoacidosis without coma: Secondary | ICD-10-CM | POA: Diagnosis not present

## 2023-05-04 DIAGNOSIS — Z930 Tracheostomy status: Secondary | ICD-10-CM | POA: Diagnosis not present

## 2023-05-04 DIAGNOSIS — U071 COVID-19: Secondary | ICD-10-CM | POA: Diagnosis not present

## 2023-05-04 DIAGNOSIS — R197 Diarrhea, unspecified: Secondary | ICD-10-CM | POA: Diagnosis not present

## 2023-05-04 DIAGNOSIS — Z8673 Personal history of transient ischemic attack (TIA), and cerebral infarction without residual deficits: Secondary | ICD-10-CM | POA: Diagnosis not present

## 2023-05-04 DIAGNOSIS — F32A Depression, unspecified: Secondary | ICD-10-CM | POA: Diagnosis not present

## 2023-05-04 DIAGNOSIS — T8744 Infection of amputation stump, left lower extremity: Secondary | ICD-10-CM | POA: Diagnosis not present

## 2023-05-04 DIAGNOSIS — I739 Peripheral vascular disease, unspecified: Secondary | ICD-10-CM | POA: Diagnosis not present

## 2023-05-04 DIAGNOSIS — K5900 Constipation, unspecified: Secondary | ICD-10-CM | POA: Diagnosis not present

## 2023-05-04 DIAGNOSIS — E785 Hyperlipidemia, unspecified: Secondary | ICD-10-CM | POA: Diagnosis not present

## 2023-05-04 DIAGNOSIS — E039 Hypothyroidism, unspecified: Secondary | ICD-10-CM | POA: Diagnosis not present

## 2023-05-04 DIAGNOSIS — E1051 Type 1 diabetes mellitus with diabetic peripheral angiopathy without gangrene: Secondary | ICD-10-CM | POA: Diagnosis not present

## 2023-05-04 DIAGNOSIS — I4891 Unspecified atrial fibrillation: Secondary | ICD-10-CM | POA: Diagnosis not present

## 2023-05-04 DIAGNOSIS — G8929 Other chronic pain: Secondary | ICD-10-CM | POA: Diagnosis not present

## 2023-05-04 DIAGNOSIS — F05 Delirium due to known physiological condition: Secondary | ICD-10-CM | POA: Diagnosis not present

## 2023-05-08 DIAGNOSIS — I152 Hypertension secondary to endocrine disorders: Secondary | ICD-10-CM | POA: Diagnosis not present

## 2023-05-08 DIAGNOSIS — T8740 Infection of amputation stump, unspecified extremity: Secondary | ICD-10-CM | POA: Diagnosis not present

## 2023-05-08 DIAGNOSIS — I480 Paroxysmal atrial fibrillation: Secondary | ICD-10-CM | POA: Diagnosis not present

## 2023-05-08 DIAGNOSIS — E1159 Type 2 diabetes mellitus with other circulatory complications: Secondary | ICD-10-CM | POA: Diagnosis not present

## 2023-05-08 DIAGNOSIS — Z89512 Acquired absence of left leg below knee: Secondary | ICD-10-CM | POA: Diagnosis not present

## 2023-05-15 DIAGNOSIS — S81802A Unspecified open wound, left lower leg, initial encounter: Secondary | ICD-10-CM | POA: Diagnosis not present

## 2023-05-16 DIAGNOSIS — E039 Hypothyroidism, unspecified: Secondary | ICD-10-CM | POA: Diagnosis not present

## 2023-05-16 DIAGNOSIS — I152 Hypertension secondary to endocrine disorders: Secondary | ICD-10-CM | POA: Diagnosis not present

## 2023-05-16 DIAGNOSIS — E785 Hyperlipidemia, unspecified: Secondary | ICD-10-CM | POA: Diagnosis not present

## 2023-05-16 DIAGNOSIS — R809 Proteinuria, unspecified: Secondary | ICD-10-CM | POA: Diagnosis not present

## 2023-05-16 DIAGNOSIS — E1029 Type 1 diabetes mellitus with other diabetic kidney complication: Secondary | ICD-10-CM | POA: Diagnosis not present

## 2023-05-16 DIAGNOSIS — E1069 Type 1 diabetes mellitus with other specified complication: Secondary | ICD-10-CM | POA: Diagnosis not present

## 2023-05-16 DIAGNOSIS — E1159 Type 2 diabetes mellitus with other circulatory complications: Secondary | ICD-10-CM | POA: Diagnosis not present

## 2023-05-17 DIAGNOSIS — M7502 Adhesive capsulitis of left shoulder: Secondary | ICD-10-CM | POA: Diagnosis not present

## 2023-05-17 DIAGNOSIS — M5412 Radiculopathy, cervical region: Secondary | ICD-10-CM | POA: Diagnosis not present

## 2023-05-21 ENCOUNTER — Emergency Department: Payer: HMO

## 2023-05-21 ENCOUNTER — Encounter: Payer: Self-pay | Admitting: Emergency Medicine

## 2023-05-21 ENCOUNTER — Other Ambulatory Visit: Payer: Self-pay

## 2023-05-21 ENCOUNTER — Encounter: Payer: HMO | Attending: Physician Assistant | Admitting: Physician Assistant

## 2023-05-21 ENCOUNTER — Emergency Department
Admission: EM | Admit: 2023-05-21 | Discharge: 2023-05-22 | Disposition: A | Discharge status: Home / Self Care | Payer: HMO | Attending: Student in an Organized Health Care Education/Training Program | Admitting: Student in an Organized Health Care Education/Training Program

## 2023-05-21 DIAGNOSIS — Z7901 Long term (current) use of anticoagulants: Secondary | ICD-10-CM | POA: Diagnosis not present

## 2023-05-21 DIAGNOSIS — Y838 Other surgical procedures as the cause of abnormal reaction of the patient, or of later complication, without mention of misadventure at the time of the procedure: Secondary | ICD-10-CM | POA: Insufficient documentation

## 2023-05-21 DIAGNOSIS — K8689 Other specified diseases of pancreas: Secondary | ICD-10-CM | POA: Diagnosis not present

## 2023-05-21 DIAGNOSIS — L97822 Non-pressure chronic ulcer of other part of left lower leg with fat layer exposed: Secondary | ICD-10-CM | POA: Insufficient documentation

## 2023-05-21 DIAGNOSIS — K8020 Calculus of gallbladder without cholecystitis without obstruction: Secondary | ICD-10-CM | POA: Diagnosis not present

## 2023-05-21 DIAGNOSIS — E10622 Type 1 diabetes mellitus with other skin ulcer: Secondary | ICD-10-CM | POA: Diagnosis not present

## 2023-05-21 DIAGNOSIS — N281 Cyst of kidney, acquired: Secondary | ICD-10-CM | POA: Diagnosis not present

## 2023-05-21 DIAGNOSIS — K5900 Constipation, unspecified: Secondary | ICD-10-CM

## 2023-05-21 DIAGNOSIS — R109 Unspecified abdominal pain: Secondary | ICD-10-CM | POA: Insufficient documentation

## 2023-05-21 DIAGNOSIS — Z89512 Acquired absence of left leg below knee: Secondary | ICD-10-CM | POA: Diagnosis not present

## 2023-05-21 DIAGNOSIS — R519 Headache, unspecified: Secondary | ICD-10-CM | POA: Insufficient documentation

## 2023-05-21 DIAGNOSIS — T8131XA Disruption of external operation (surgical) wound, not elsewhere classified, initial encounter: Secondary | ICD-10-CM | POA: Diagnosis not present

## 2023-05-21 DIAGNOSIS — R0602 Shortness of breath: Secondary | ICD-10-CM | POA: Insufficient documentation

## 2023-05-21 DIAGNOSIS — R079 Chest pain, unspecified: Secondary | ICD-10-CM | POA: Diagnosis not present

## 2023-05-21 DIAGNOSIS — E119 Type 2 diabetes mellitus without complications: Secondary | ICD-10-CM | POA: Insufficient documentation

## 2023-05-21 DIAGNOSIS — Z20822 Contact with and (suspected) exposure to covid-19: Secondary | ICD-10-CM | POA: Insufficient documentation

## 2023-05-21 DIAGNOSIS — T81328A Disruption or dehiscence of closure of other specified internal operation (surgical) wound, initial encounter: Secondary | ICD-10-CM | POA: Diagnosis not present

## 2023-05-21 DIAGNOSIS — R0600 Dyspnea, unspecified: Secondary | ICD-10-CM

## 2023-05-21 LAB — RESP PANEL BY RT-PCR (RSV, FLU A&B, COVID)  RVPGX2
Influenza A by PCR: NEGATIVE
Influenza B by PCR: NEGATIVE
Resp Syncytial Virus by PCR: NEGATIVE
SARS Coronavirus 2 by RT PCR: NEGATIVE

## 2023-05-21 LAB — CBC WITH DIFFERENTIAL/PLATELET
Abs Immature Granulocytes: 0.04 10*3/uL (ref 0.00–0.07)
Basophils Absolute: 0.1 10*3/uL (ref 0.0–0.1)
Basophils Relative: 1 %
Eosinophils Absolute: 0.1 10*3/uL (ref 0.0–0.5)
Eosinophils Relative: 1 %
HCT: 46.6 % (ref 39.0–52.0)
Hemoglobin: 15.4 g/dL (ref 13.0–17.0)
Immature Granulocytes: 1 %
Lymphocytes Relative: 20 %
Lymphs Abs: 1.8 10*3/uL (ref 0.7–4.0)
MCH: 28.9 pg (ref 26.0–34.0)
MCHC: 33 g/dL (ref 30.0–36.0)
MCV: 87.6 fL (ref 80.0–100.0)
Monocytes Absolute: 0.7 10*3/uL (ref 0.1–1.0)
Monocytes Relative: 8 %
Neutro Abs: 6.3 10*3/uL (ref 1.7–7.7)
Neutrophils Relative %: 69 %
Platelets: 389 10*3/uL (ref 150–400)
RBC: 5.32 MIL/uL (ref 4.22–5.81)
RDW: 13.4 % (ref 11.5–15.5)
WBC: 8.9 10*3/uL (ref 4.0–10.5)
nRBC: 0 % (ref 0.0–0.2)

## 2023-05-21 LAB — BASIC METABOLIC PANEL
Anion gap: 15 (ref 5–15)
BUN: 46 mg/dL — ABNORMAL HIGH (ref 6–20)
CO2: 19 mmol/L — ABNORMAL LOW (ref 22–32)
Calcium: 9.2 mg/dL (ref 8.9–10.3)
Chloride: 95 mmol/L — ABNORMAL LOW (ref 98–111)
Creatinine, Ser: 1.44 mg/dL — ABNORMAL HIGH (ref 0.61–1.24)
GFR, Estimated: 56 mL/min — ABNORMAL LOW (ref >60)
Glucose, Bld: 431 mg/dL — ABNORMAL HIGH (ref 70–99)
Potassium: 4.1 mmol/L (ref 3.5–5.1)
Sodium: 129 mmol/L — ABNORMAL LOW (ref 135–145)

## 2023-05-21 LAB — TROPONIN I (HIGH SENSITIVITY)
Troponin I (High Sensitivity): 5 ng/L (ref <18)
Troponin I (High Sensitivity): 5 ng/L (ref <18)

## 2023-05-21 LAB — CBG MONITORING, ED
Glucose-Capillary: 162 mg/dL — ABNORMAL HIGH (ref 70–99)
Glucose-Capillary: 241 mg/dL — ABNORMAL HIGH (ref 70–99)

## 2023-05-21 LAB — D-DIMER, QUANTITATIVE: D-Dimer, Quant: <0.27 ug{FEU}/mL (ref 0.00–0.50)

## 2023-05-21 LAB — BRAIN NATRIURETIC PEPTIDE: B Natriuretic Peptide: 19 pg/mL (ref 0.0–100.0)

## 2023-05-21 MED ORDER — SODIUM CHLORIDE 0.9 % IV BOLUS
500.0000 mL | Freq: Once | INTRAVENOUS | Status: AC
  Administered 2023-05-21: 500 mL via INTRAVENOUS

## 2023-05-21 MED ORDER — AZITHROMYCIN 250 MG PO TABS: ORAL_TABLET | ORAL | 0 refills | Status: AC

## 2023-05-21 MED ORDER — ACETAMINOPHEN 325 MG PO TABS
650.0000 mg | ORAL_TABLET | Freq: Once | ORAL | Status: AC
  Administered 2023-05-21: 650 mg via ORAL
  Filled 2023-05-21: qty 2

## 2023-05-21 MED ORDER — AZITHROMYCIN 500 MG PO TABS
500.0000 mg | ORAL_TABLET | Freq: Once | ORAL | Status: AC
  Administered 2023-05-21: 500 mg via ORAL
  Filled 2023-05-21: qty 1

## 2023-05-21 MED ORDER — LORAZEPAM 0.5 MG PO TABS
0.5000 mg | ORAL_TABLET | Freq: Once | ORAL | Status: AC
  Administered 2023-05-21: 0.5 mg via ORAL
  Filled 2023-05-21: qty 1

## 2023-05-21 MED ORDER — POLYETHYLENE GLYCOL 3350 17 GM/SCOOP PO POWD: 1.0000 | Freq: Once | ORAL | 0 refills | Status: AC

## 2023-05-21 NOTE — ED Provider Notes (Addendum)
 [facility] Provider Note    Event Date/Time   First MD Initiated Contact with Patient 05/21/23 1915     (approximate)   History    HPI  [name] is a 63 y.o. male who presents to the ER for evaluation of shortness of breath. States that he has been having shortness of breath some chills some headache mild in nature as well as some chest pain for several days.  He is a diabetic.  States that his blood sugars are running high because they were low prior to arrival and the patient drank a bottle of Dr. Nunzio.     Physical Exam   Triage Vital Signs: ED Triage Vitals  Encounter Vitals Group     BP 05/21/23 1743 (!) 163/109     Systolic BP Percentile --      Diastolic BP Percentile --      Pulse Rate 05/21/23 1743 84     Resp 05/21/23 1743 (!) 40     Temp 05/21/23 1743 98.3 F (36.8 C)     Temp Source 05/21/23 1743 Oral     SpO2 05/21/23 1743 95 %     Weight 05/21/23 1745 240 lb (108.9 kg)     Height 05/21/23 1745 6' (1.829 m)     Head Circumference --      Peak Flow --      Pain Score 05/21/23 1744 0     Pain Loc --      Pain Education --      Exclude from Growth Chart --     Most recent vital signs: Vitals:   05/21/23 1743 05/21/23 2030  BP: (!) 163/109 (!) 163/89  Pulse: 84 77  Resp: (!) 40 (!) 23  Temp: 98.3 F (36.8 C)   SpO2: 95% 95%     Constitutional: Alert  Eyes: Conjunctivae are normal.  Head: Atraumatic. Nose: No congestion/rhinnorhea. Mouth/Throat: Mucous membranes are moist.   Neck: Painless ROM.  Cardiovascular:   Good peripheral circulation. Respiratory: Normal respiratory effort.  No retractions.  Good air movement throughout  Gastrointestinal: Soft and nontender.  Musculoskeletal:  no deformity Neurologic:  MAE spontaneously. No gross focal neurologic deficits are appreciated.  Skin:  Skin is warm, dry and intact. No rash noted. Psychiatric: Mood and affect are mildly anxious    ED Results / Procedures /  Treatments   Labs (all labs ordered are listed, but only abnormal results are displayed) Labs Reviewed  BASIC METABOLIC PANEL - Abnormal; Notable for the following components:      Result Value   Sodium 129 (*)    Chloride 95 (*)    CO2 19 (*)    Glucose, Bld 431 (*)    BUN 46 (*)    Creatinine, Ser 1.44 (*)    GFR, Estimated 56 (*)    All other components within normal limits  CBG MONITORING, ED - Abnormal; Notable for the following components:   Glucose-Capillary 241 (*)    All other components within normal limits  RESP PANEL BY RT-PCR (RSV, FLU A&B, COVID)  RVPGX2  CBC WITH DIFFERENTIAL/PLATELET  D-DIMER, QUANTITATIVE  BRAIN NATRIURETIC PEPTIDE  TROPONIN I (HIGH SENSITIVITY)  TROPONIN I (HIGH SENSITIVITY)     EKG  ED ECG REPORT I, Belvie Essex, the attending physician, personally viewed and interpreted this ECG.   Date: 05/21/2023  EKG Time: 17:38  Rate: 90  Rhythm: sinus  Axis: normal  Intervals: normal  ST&T Change: no stemi,  no depressions    RADIOLOGY Please see ED Course for my review and interpretation.  I personally reviewed all radiographic images ordered to evaluate for the above acute complaints and reviewed radiology reports and findings.  These findings were personally discussed with the patient.  Please see medical record for radiology report.    PROCEDURES:  Critical Care performed: No  Procedures   MEDICATIONS ORDERED IN ED: Medications  azithromycin  (ZITHROMAX ) tablet 500 mg (has no administration in time range)  LORazepam  (ATIVAN ) tablet 0.5 mg (has no administration in time range)  sodium chloride  0.9 % bolus 500 mL (0 mLs Intravenous Stopped 05/21/23 2123)  acetaminophen  (TYLENOL ) tablet 650 mg (650 mg Oral Given 05/21/23 2127)     IMPRESSION / MDM / ASSESSMENT AND PLAN / ED COURSE  I reviewed the triage vital signs and the nursing notes.                              Differential diagnosis includes, but is not limited to,  ACS, pericarditis, esophagitis, boerhaaves, pe, dissection, pna, bronchitis, costochondritis  Patient presenting to the ER for evaluation of symptoms as described above.  Based on symptoms, risk factors and considered above differential, this presenting complaint could reflect a potentially life-threatening illness therefore the patient will be placed on continuous pulse oximetry and telemetry for monitoring.  Laboratory evaluation will be sent to evaluate for the above complaints.  Patient does not have any significant wheezing or crackles on exam.  He is satting well on room air.    Clinical Course as of 05/21/23 2152  Mon May 21, 2023  2140 Patient with mild pseudohyponatremia.  Repeat CBG improving.  Not consistent with DKA.  Patient receiving IV fluids.  RVP negative.  CBC without evidence of infection BNP negative D-dimer negative. [PR]  2147 Patient reassessed.  Patient nontoxic appearing further conversation patient does admit to significant anxiety and feeling scared.  States he lives at home doing quite a bit of stress.  He is denying any chest pain.  BNP is normal.  Troponin normal D-dimer negative no leukocytosis.  Will give small dose of Ativan  as I do think this is contributing to his symptoms.  As his chest x-ray is reassuring and patient feeling better after bolus of IV fluids do think that outpatient follow-up is an appropriate option.  We discussed supportive care and signs and symptoms which she should return to the ER. [PR]    Clinical Course User Index [PR] Lang Dover, MD   Discharge patient started complaining of abdominal pain states having trouble moving his bowels feels bloated and distended.  CT imaging was ordered which shows evidence of significant constipation no sign of stone.  Does have some cholelithiasis his exam is not consistent with cholecystitis.  He is appropriate for outpatient follow-up.  FINAL CLINICAL IMPRESSION(S) / ED DIAGNOSES   Final diagnoses:   Dyspnea, unspecified type     Rx / DC Orders   ED Discharge Orders          Ordered    azithromycin  (ZITHROMAX  Z-PAK) 250 MG tablet        05/21/23 2152             Note:  This document was prepared using Dragon voice recognition software and may include unintentional dictation errors.    Lang Dover, MD 05/21/23 2152    Lang Dover, MD 05/21/23 [phone]

## 2023-05-21 NOTE — ED Notes (Signed)
 Assisted pt to stand at the side of the bed to use urinal. Pt back to bed. Call light within reach. Family at bedside.

## 2023-05-21 NOTE — ED Triage Notes (Addendum)
 Pt in via POV, complaints of ongoing shortness of breath, chills, headache.  Also reports some chest pain.  Patient w/ tracheostomy at baseline since 2015.  Patient is also type 1 diabetic; pump in place and working properly.

## 2023-05-21 NOTE — ED Provider Triage Note (Signed)
 Emergency Medicine Provider Triage Evaluation Note  [name] , a 63 y.o. male  was evaluated in triage.  Pt complains of difficulty breathing, decreased appetite.  Patient has tracheostomy, and infection in left knee. Patient was referred from urgent care Review of Systems  Positive:  Negative:   Physical Exam  There were no vitals taken for this visit. Gen:   Awake, no distress   Resp:  Normal effort, mobilization of secretions. No wheezing. MSK:   Moves extremities without difficulty  Other:    Medical Decision Making  Medically screening exam initiated at 5:42 PM.  Appropriate orders placed.  [name] was informed that the remainder of the evaluation will be completed by another provider, this initial triage assessment does not replace that evaluation, and the importance of remaining in the ED until their evaluation is complete.  Patien with possible URI, ordered respiratory panel, CXR, CBC   Janit Kast, PA-C 05/21/23 1745

## 2023-05-22 DIAGNOSIS — S81802A Unspecified open wound, left lower leg, initial encounter: Secondary | ICD-10-CM | POA: Diagnosis not present

## 2023-05-23 NOTE — Progress Notes (Signed)
 RAFFERTY, POSTLEWAIT ([id number]) [id number] Page 1 of 10 Visit Report for 05/21/2023 Allergy List Details Patient Name: Date of Service: [name] 05/21/2023 2:00 PM Medical Record Number: [id number] Patient Account Number: [id number] Date of Birth/Sex: Treating RN: 1960-09-18 (63 y.o. [name] Claudene Blossom Primary Care Jeanmarie Mccowen: [name] Other Clinician: Referring Bryttani Blew: Treating Nithila Sumners/Extender: Bethena Andre [name] Weeks in Treatment: 0 Allergies Active Allergies Depakote  Severity: Moderate BuSpar Severity: Mild Plavix Severity: Moderate gabapentin Severity: Moderate Allergy Notes Electronic Signature(s) Signed: 05/21/2023 4:49:55 PM By: Claudene Blossom MSN RN CNS WTA Entered By: Claudene Blossom on 05/21/2023 14:35:11 -------------------------------------------------------------------------------- Arrival Information Details Patient Name: Date of Service: [name], Joel NIEL G. 05/21/2023 2:00 PM Medical Record Number: [id number] Patient Account Number: [id number] Date of Birth/Sex: Treating RN: 10/14/1960 (63 y.o. [name] Claudene Blossom Primary Care Akera Snowberger: [name] Other Clinician: Referring Antavius Sperbeck: Treating Arantza Darrington/Extender: Bethena Andre [name] Devra in Treatment: 0 Visit Information Patient Arrived: Wheel Chair Arrival Time: 14:32 Accompanied By: mother Transfer Assistance: None Patient Identification VerifiedWILLARD, [name] ([id number]) Patient Identification Verified: Yes Secondary Verification Process Completed: Yes Patient Requires Transmission-Based Precautions: No Patient Has Alerts: Yes Patient Alerts: Patient on Blood T Diabetic type 1 Eliquis  History Since Last Visit Added or deleted any medications: No Any new allergies or adverse reactions: No Had a fall or experienced change in activities of daily living that may affect risk of falls: No [id number] Page 2 of 10 Signs or  symptoms of abuse/neglect since last visito No Hospitalized since last visit: No Has Dressing in Place as Prescribed: Yes Pain Present Now: No hinner Electronic Signature(s) Signed: 05/21/2023 3:42:42 PM By: Claudene Blossom MSN RN CNS WTA Entered By: Claudene Blossom on 05/21/2023 15:42:42 -------------------------------------------------------------------------------- Clinic Level of Care Assessment Details Patient Name: Date of Service: [name], Joel NIEL G. 05/21/2023 2:00 PM Medical Record Number: [id number] Patient Account Number: [id number] Date of Birth/Sex: Treating RN: [birth year] (63 y.o. [name] Claudene Blossom Primary Care Janaye [employer]: [name] Other Clinician: Referring Carsten Carstarphen: Treating Tiegan Terpstra/Extender: Bethena Andre [name] Devra in Treatment: 0 Clinic Level of Care Assessment Items TOOL 1 Quantity Score X- 1 0 Use when EandM and Procedure is performed on INITIAL visit ASSESSMENTS - Nursing Assessment / Reassessment X- 1 20 General Physical Exam (combine w/ comprehensive assessment (listed just below) when performed on new pt. evals) X- 1 25 Comprehensive Assessment (HX, ROS, Risk Assessments, Wounds Hx, etc.) ASSESSMENTS - Wound and Skin Assessment / Reassessment []  - 0 Dermatologic / Skin Assessment (not related to wound area) ASSESSMENTS - Ostomy and/or Continence Assessment and Care []  - 0 Incontinence Assessment and Management []  - 0 Ostomy Care Assessment and Management (repouching, etc.) PROCESS - Coordination of Care X - Simple Patient / Family Education for ongoing care 1 15 []  - 0 Complex (extensive) Patient / Family Education for ongoing care X- 1 10 Staff obtains Chiropractor, Records, T Results / Process Orders est []  - 0 Staff telephones HHA, Nursing Homes / Clarify orders / etc []  - 0 Routine Transfer to another Facility (non-emergent condition) []  - 0 Routine Hospital Admission (non-emergent condition) X- 1 15 New Admissions / Manufacturing Engineer  / Ordering NPWT Apligraf, etc. , []  - 0 Emergency Hospital Admission (emergent condition) PROCESS - Special Needs []  - 0 Pediatric / Minor Patient Management []  - 0 Isolation Patient Management []  - 0 Hearing / Language / Visual special needs []  - 0 Assessment of Community assistance (transportation, D/C planning, etc.) []  - 0 Additional assistance /  Altered mentation [name], [name] ([id number]) [phone].pdf Page 3 of 10 []  - 0 Support Surface(s) Assessment (bed, cushion, seat, etc.) INTERVENTIONS - Miscellaneous []  - 0 External ear exam []  - 0 Patient Transfer (multiple staff / Nurse, Adult / Similar devices) []  - 0 Simple Staple / Suture removal (25 or less) []  - 0 Complex Staple / Suture removal (26 or more) []  - 0 Hypo/Hyperglycemic Management (do not check if billed separately) []  - 0 Ankle / Brachial Index (ABI) - do not check if billed separately Has the patient been seen at the hospital within the last three years: Yes Total Score: 85 Level Of Care: New/Established - Level 3 Electronic Signature(s) Signed: 05/21/2023 4:49:55 PM By: Claudene Blossom MSN RN CNS WTA Entered By: Claudene Blossom on 05/21/2023 15:48:37 -------------------------------------------------------------------------------- Encounter Discharge Information Details Patient Name: Date of Service: [name], Joel NIEL G. 05/21/2023 2:00 PM Medical Record Number: [id number] Patient Account Number: [id number] Date of Birth/Sex: Treating RN: 09/15/1960 (63 y.o. [name] Claudene Blossom Primary Care Gardner Servantes: [name] Other Clinician: Referring Philipe Laswell: Treating Dorthie Santini/Extender: Bethena Andre [name] Devra in Treatment: 0 Encounter Discharge Information Items Post Procedure Vitals Discharge Condition: Stable Temperature (F): 98.5 Ambulatory Status: Wheelchair Pulse (bpm): 80 Discharge Destination: Other (Note Required) Respiratory Rate (breaths/min): 18 Telephoned: Yes Blood  Pressure (mmHg): 148/80 Orders Sent: Yes Transportation: Private Auto Accompanied By: mother Schedule Follow-up Appointment: Yes Clinical Summary of Care: Notes Sending to the ER as a precaution because of the way patient describes his symptoms of not feeling well, nausea, sweats, shortness of breath Electronic Signature(s) Signed: 05/21/2023 3:52:17 PM By: Claudene Blossom MSN RN CNS WTA Entered By: Claudene Blossom on 05/21/2023 15:52:17 [name] ([id number]) [id number] Page 4 of 10 -------------------------------------------------------------------------------- Lower Extremity Assessment Details Patient Name: Date of Service: Joel CLARINE, Joel NIEL G. 05/21/2023 2:00 PM Medical Record Number: [id number] Patient Account Number: [id number] Date of Birth/Sex: Treating RN: [birth year] (63 y.o. [name] Claudene Blossom Primary Care Aniket Paye: [name] Other Clinician: Referring Kairos Panetta: Treating Kirtan Sada/Extender: Bethena Andre [name] Devra in Treatment: 0 Electronic Signature(s) Signed: 05/21/2023 3:43:35 PM By: Claudene Blossom MSN RN CNS WTA Entered By: Claudene Blossom on 05/21/2023 15:43:34 -------------------------------------------------------------------------------- Multi Wound Chart Details Patient Name: Date of Service: Joel CLARINE, Joel NIEL G. 05/21/2023 2:00 PM Medical Record Number: [id number] Patient Account Number: [id number] Date of Birth/Sex: Treating RN: 1961/03/04 (63 y.o. [name] Claudene Blossom Primary Care Yarixa Lightcap: [name] Other Clinician: Referring Erian Rosengren: Treating Mackenzi Krogh/Extender: Bethena Andre [name] Devra in Treatment: 0 Vital Signs Height(in): Pulse(bpm): 80 Weight(lbs): 240 Blood Pressure(mmHg): 148/80 Body Mass Index(BMI): Temperature(F): 98.5 Respiratory Rate(breaths/min): 18 [3:Photos:] [N/A:N/A] Left, Anterior Amputation Site - Below N/A N/A Wound Location: Knee Surgical Injury N/A N/A Wounding Event: Open Surgical Wound N/A  N/A Primary Etiology: Coronary Artery Disease, N/A N/A Comorbid History: Hypertension, Type I Diabetes 04/06/2023 N/A N/A Date Acquired: 0 N/A N/A Weeks of Treatment: Open N/A N/A Wound Status: [name], [name] ([id number]) [id number] Page 5 of 10 No N/A N/A Wound Recurrence: 3x7x0.1 N/A N/A Measurements L x W x D (cm) 16.493 N/A N/A A (cm) : rea 1.649 N/A N/A Volume (cm) : Full Thickness Without Exposed N/A N/A Classification: Support Structures Medium N/A N/A Exudate A mount: Serosanguineous N/A N/A Exudate Type: red, brown N/A N/A Exudate Color: Medium (34-66%) N/A N/A Granulation A mount: Red, Pink N/A N/A Granulation Quality: None Present (0%) N/A N/A Necrotic A mount: Fat Layer (Subcutaneous Tissue): Yes N/A N/A Exposed Structures: Fascia: No Tendon: No Muscle: No  Joint: No Bone: No Medium (34-66%) N/A N/A Epithelialization: Debridement - Excisional N/A N/A Debridement: Pre-procedure Verification/Time Out 15:09 N/A N/A Taken: Lidocaine 4% Topical Solution N/A N/A Pain Control: Subcutaneous, Slough N/A N/A Tissue Debrided: Skin/Subcutaneous Tissue N/A N/A Level: 16.48 N/A N/A Debridement A (sq cm): rea Curette N/A N/A Instrument: Moderate N/A N/A Bleeding: Pressure N/A N/A Hemostasis A chieved: 0 N/A N/A Procedural Pain: 0 N/A N/A Post Procedural Pain: Procedure was tolerated well N/A N/A Debridement Treatment Response: 3x7x0.1 N/A N/A Post Debridement Measurements L x W x D (cm) 1.649 N/A N/A Post Debridement Volume: (cm) Debridement N/A N/A Procedures Performed: Treatment Notes 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 Kerlix Roll Sterile or Non-Sterile 6-ply 4.5x4 (yd/yd) Discharge Instruction: Apply Kerlix as directed Compression Wrap Compression Stockings Add-Ons Electronic Signature(s) Signed: 05/21/2023 3:51:14 PM By: Claudene Blossom MSN RN CNS WTA Previous Signature: 05/21/2023 3:44:00 PM Version By: Claudene Blossom MSN RN CNS WTA Entered By: Claudene Blossom on 05/21/2023 15:51:14 [name] ([id number]) [id number] Page 6 of 10 -------------------------------------------------------------------------------- Multi-Disciplinary Care Plan Details Patient Name: Date of Service: Joel CLARINE ARLINE CELESTINO Holder 05/21/2023 2:00 PM Medical Record Number: [id number] Patient Account Number: [id number] Date of Birth/Sex: Treating RN: Aug 23, 1960 (63 y.o. [name] Claudene Blossom Primary Care Overton Boggus: [name] Other Clinician: Referring Naeemah Jasmer: Treating Estefanny Moler/Extender: Bethena Andre [name] Devra in Treatment: 0 Active Inactive Necrotic Tissue Nursing Diagnoses: Impaired tissue integrity related to necrotic/devitalized tissue Knowledge deficit related to management of necrotic/devitalized tissue Goals: Necrotic/devitalized tissue will be minimized in the wound bed Date Initiated: 05/21/2023 Target Resolution Date: 06/04/2023 Goal Status: Active Patient/caregiver will verbalize understanding of reason and process for debridement of necrotic tissue Date Initiated: 05/21/2023 Target Resolution Date: 06/04/2023 Goal Status: Active Interventions: Assess patient pain level pre-, during and post procedure and prior to discharge Provide education on necrotic tissue and debridement  process Treatment Activities: Apply topical anesthetic as ordered : 05/21/2023 Excisional debridement : 05/21/2023 Notes: Wound/Skin Impairment Nursing Diagnoses: Impaired tissue integrity Knowledge deficit related to ulceration/compromised skin integrity Goals: Ulcer/skin breakdown will have a volume reduction of 30% by week 4 Date Initiated: 05/21/2023 Target Resolution Date: 06/21/2023 Goal Status: Active Ulcer/skin breakdown will have a volume reduction of 50% by week 8 Date Initiated: 05/21/2023 Target Resolution Date: 07/19/2023 Goal Status: Active Ulcer/skin breakdown will have a volume reduction of 80% by week 12 Date Initiated: 05/21/2023 Target Resolution Date: 08/19/2023 Goal Status: Active Ulcer/skin breakdown will heal within 14 weeks Date Initiated: 05/21/2023 Target Resolution Date: 09/02/2023 Goal Status: Active Interventions: Assess patient/caregiver ability to obtain necessary supplies Assess patient/caregiver ability to perform ulcer/skin care regimen upon admission and as needed Assess ulceration(s) every visit Provide education on ulcer and skin care Notes: KABLE, HAYWOOD ([id number]) [phone].pdf Page 7 of 10 Electronic Signature(s) Signed: 05/21/2023 3:50:24 PM By: Claudene Blossom MSN RN CNS WTA Previous Signature: 05/21/2023 3:50:20 PM Version By: Claudene Blossom MSN RN CNS WTA Entered By: Claudene Blossom on 05/21/2023 15:50:24 -------------------------------------------------------------------------------- Pain Assessment Details Patient Name: Date of  Service: [name], Joel NIEL G. 05/21/2023 2:00 PM Medical Record Number: [id number] Patient Account Number: [id number] Date of Birth/Sex: Treating RN: [birth year] (63 y.o. [name] Claudene Blossom Primary Care Nealy Hickmon: [name] Other Clinician: Referring Shad Ledvina: Treating Yaphet Smethurst/Extender: Bethena Andre [name] Devra in Treatment: 0 Active Problems Location of Pain Severity and Description of  Pain Patient Has Paino No Site Locations Pain Management and Medication Current Pain Management: Electronic Signature(s) Signed: 05/21/2023 3:42:48 PM By: Claudene Blossom MSN RN CNS WTA Entered By: Claudene Blossom on 05/21/2023 15:42:48 Patient/Caregiver Education Details -------------------------------------------------------------------------------- [name] ([id number]) [id number] Page 8 of 10 Patient Name: Date of Service: [name] 1/13/2025andnbsp2:00 PM Medical Record Number: [id number] Patient Account Number: [id number] Date of Birth/Gender: Treating RN: 03-14-1961 (63 y.o. [name] Claudene Blossom Primary Care Physician: [name] Other Clinician: Referring Physician: Treating Physician/Extender: Bethena Andre [name] Devra in Treatment: 0 Education Assessment Education Provided To: Patient Education Topics Provided Wound Debridement: Handouts: Wound Debridement Methods: Explain/Verbal Responses: State content correctly Wound/Skin Impairment: Handouts: Caring for Your Ulcer Methods: Explain/Verbal Responses: State content correctly Electronic Signature(s) Signed: 05/21/2023 4:49:55 PM By: Claudene Blossom MSN RN CNS WTA Entered By: Claudene Blossom on 05/21/2023 15:50:41 -------------------------------------------------------------------------------- Wound Assessment Details Patient Name: Date of Service: [name], Joel NIEL G. 05/21/2023 2:00 PM Medical Record Number: [id number] Patient Account Number: [id number] Date of Birth/Sex: Treating RN: 04-19-1961 (63 y.o. [name] Claudene Blossom Primary Care Wilene Pharo: [name] Other Clinician: Referring Shera Laubach: Treating Imajean Mcdermid/Extender: Bethena Andre [name] Weeks in Treatment: 0 Wound Status Wound Number: 3 Primary Etiology: Open Surgical Wound Wound Location: Left, Anterior Amputation Site - Below Knee Wound Status: Open Wounding Event: Surgical Injury Comorbid History: Coronary  Artery Disease, Hypertension, Type I Diabetes Date Acquired: 04/06/2023 Weeks Of Treatment: 0 Clustered Wound: No Photos PER, BEAGLEY ([id number]) [id number] Page 9 of 10 Wound Measurements Length: (cm) 3 Width: (cm) 7 Depth: (cm) 0.1 Area: (cm) 16.493 Volume: (cm) 1.649 % Reduction in Area: % Reduction in Volume: Epithelialization: Medium (34-66%) Tunneling: No Undermining: No Wound Description Classification: Full Thickness Without Exposed Suppor Exudate Amount: Medium Exudate Type: Serosanguineous Exudate Color: red, brown t Structures Foul Odor After Cleansing: No Slough/Fibrino No Wound Bed Granulation Amount: Medium (34-66%) Exposed Structure Granulation Quality: Red, Pink Fascia Exposed: No Necrotic Amount: None Present (0%) Fat Layer (Subcutaneous Tissue) Exposed: Yes Tendon Exposed: No Muscle Exposed: No Joint Exposed: No Bone Exposed: No Treatment Notes 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) Wound Laterality: Left, Anterior Cleanser Soap and Water Discharge Instruction: Gently cleanse wound with antibacterial soap, rinse and pat dry prior to dressing wounds Vashe 5.8 (oz) Discharge Instruction: Use vashe 5.8 (oz) as directed Peri-Wound Care Topical Primary Dressing Hydrofera Blue Ready Transfer Foam, 4x5 (in/in) Discharge Instruction: Apply Hydrofera Blue Ready to wound bed as directed Mepilex/Ag Foam Dressing, 6x6 (in/in) Skin barrier wipes Secondary Dressing Secured With Compression Wrap Compression Stockings Add-Ons Electronic Signature(s) Signed: 05/21/2023 3:02:18 PM By: Bethena Andre PA-C Signed: 05/21/2023 4:49:55 PM By: Claudene Blossom MSN RN CNS WTA Entered By: Bethena Andre on 05/21/2023 15:02:18 -------------------------------------------------------------------------------- Vitals Details Patient Name: Date of Service: [name], Joel NIEL G. 05/21/2023 2:00 PM Medical Record Number: [id number] Patient Account Number:  [id number] Date of Birth/Sex: Treating RN: 1960-09-29 (62 y.o. Kalan, Yeley, TORIBIO Holder ([id number]) [id number] Page 10 of 10 Primary Care Shaylee Stanislawski: [name] Other Clinician: Referring Manal Kreutzer: Treating Rickita Forstner/Extender: Bethena Andre [name] Devra in Treatment: 0 Vital Signs Time Taken: 14:34 Temperature (F): 98.5 Weight (  lbs): 240 Pulse (bpm): 80 Source: Stated Respiratory Rate (breaths/min): 18 Blood Pressure (mmHg): 148/80 Reference Range: 80 - 120 mg / dl Electronic Signature(s) Signed: 05/21/2023 3:43:07 PM By: Claudene Blossom MSN RN CNS WTA Entered By: Claudene Blossom on 05/21/2023 15:43:07

## 2023-05-23 NOTE — Progress Notes (Signed)
 [name] ([id number]) [id number] Page 1 of 10 Visit Report for 05/21/2023 Chief Complaint Document Details Patient Name: Date of Service: [name] 05/21/2023 2:00 PM Medical Record Number: [id number] Patient Account Number: [id number] Date of Birth/Sex: Treating RN: 1961/02/13 (63 y.o. NETTY Claudene Blossom Primary Care Provider: [name] Other Clinician: Referring Provider: Treating Provider/Extender: Bethena Andre [name] Devra in Treatment: 0 Information Obtained from: Patient Chief Complaint Left lower extremity amputation site wound Electronic Signature(s) Signed: 05/21/2023 3:51:26 PM By: Claudene Blossom MSN RN CNS WTA Signed: 05/21/2023 4:43:18 PM By: Bethena Andre PA-C Previous Signature: 05/21/2023 2:58:54 PM Version By: Bethena Andre PA-C Entered By: Claudene Blossom on 05/21/2023 15:51:26 -------------------------------------------------------------------------------- Debridement Details Patient Name: Date of Service: [name], Joel NIEL G. 05/21/2023 2:00 PM Medical Record Number: [id number] Patient Account Number: [id number] Date of Birth/Sex: Treating RN: 05-17-1960 (62 y.o. NETTY Claudene Blossom Primary Care Provider: [name] Other Clinician: Referring Provider: Treating Provider/Extender: Bethena Andre [name] Devra in Treatment: 0 Debridement Performed for Assessment: Wound #3 Left,Anterior Amputation Site - Below Knee Performed By: Physician Bethena Andre, PA-C The following information was scribed by: Claudene Blossom The information was scribed for: Bethena Andre Debridement Type: Debridement Level of Consciousness (Pre-procedure): Awake and Alert Pre-procedure Verification/Time Out Yes - 15:09 Taken: Start Time: 15:09 Pain Control: Lidocaine 4% T opical Solution Percent of Wound Bed Debrided: 100% T Area Debrided (cm): otal 16.48 Tissue and other material debrided: Viable, Non-Viable, Slough, Subcutaneous, Slough Level:  Skin/Subcutaneous Tissue Debridement Description: Excisional Instrument: Curette Bleeding: Moderate [name], [name] ([id number]) [id number] Page 2 of 10 Hemostasis Achieved: Pressure Procedural Pain: 0 Post Procedural Pain: 0 Response to Treatment: Procedure was tolerated well Level of Consciousness (Post- Awake and Alert procedure): Post Debridement Measurements of Total Wound Length: (cm) 3 Width: (cm) 7 Depth: (cm) 0.1 Volume: (cm) 1.649 Character of Wound/Ulcer Post Debridement: Stable Post Procedure Diagnosis Same as Pre-procedure Electronic Signature(s) Signed: 05/21/2023 4:43:18 PM By: Bethena Andre PA-C Signed: 05/21/2023 4:49:55 PM By: Claudene Blossom MSN RN CNS WTA Entered By: Claudene Blossom on 05/21/2023 15:10:45 -------------------------------------------------------------------------------- HPI Details Patient Name: Date of Service: [name], Joel NIEL G. 05/21/2023 2:00 PM Medical Record Number: [id number] Patient Account Number: [id number] Date of Birth/Sex: Treating RN: May 26, 1960 (62 y.o. NETTY Claudene Blossom Primary Care Provider: [name] Other Clinician: Referring Provider: Treating Provider/Extender: Bethena Andre [name] Devra in Treatment: 0 History of Present Illness HPI Description: Admission 05/25/2021 [name] is a 63 year old male with a past medical history of uncontrolled type 1 diabetes on insulin  with last hemoglobin A1c of 8.8, peripheral arterial disease, status post left BKA that presents to the clinic for a left BKA amputation site wound. Patient developed a diabetic foot infection on 04/01/2021 and ultimately underwent a left BKA on 04/12/2021. The wound site became infected and he underwent an IandD on 05/07/2021. The wound was left open at that time. He has been using wet-to-dry dressings for the past 3 weeks. He currently denies Systemic signs of infection. Readmission: 05-21-2023 upon evaluation today patient  presents for reevaluation here in the clinic although it has actually been since 2023 when he was last seen here in the clinic. At that point we pretty much gotten his wound healed based on what I saw and he was discharged unfortunately he ended up with an abscess or infection of this area and he had a surgery to going to an open up and clean out the left below-knee amputation site. He is here  with his mother today. He tells me the wound seems to be doing well unfortunately he does not seem to be doing well in general. He tells me that he is having a lot of issues with feeling poorly and in fact his exact description of what was going on was that I feel like I am dying. The symptoms that he was describing was headache, nausea and abdominal pain without being able to really use the bathroom and he has not vomited. He also tells me that generally speaking he just feels very lethargic and poor overall. His vital signs were good today and did not show anything that seem to be problematic it was fairly reassuring as far as that was concerned. However in general I feel like that the symptoms and the way he is stating his issues at this point have me very concerned about the possibility of something more significant going on. It is just difficult to pinpoint. Patient did have his surgery to reopen this wound on November 29 and at that point he has been try to get this healed since that time. He also has history of diabetes mellitus type 1 he has a insulin  pump his most recent reading today was 175 does not seem to be way out of kilter. He also is on long-term anticoagulant therapy with Eliquis  and aspirin . Electronic Signature(s) Signed: 05/21/2023 3:23:54 PM By: Bethena Ferraris PA-C Entered By: Bethena Ferraris on 05/21/2023 15:23:54 [name] ([id number]) [id number] Page 3 of 10 -------------------------------------------------------------------------------- Physical Exam  Details Patient Name: Date of Service: [name], Joel NIEL G. 05/21/2023 2:00 PM Medical Record Number: [id number] Patient Account Number: [id number] Date of Birth/Sex: Treating RN: [birth year] (63 y.o. NETTY Claudene Blossom Primary Care Provider: [name] Other Clinician: Referring Provider: Treating Provider/Extender: Bethena Ferraris [name] Devra in Treatment: 0 Constitutional patient is hypertensive.. pulse regular and within target range for patient.[name] respirations regular, non-labored and within target range for patient.[name] temperature within target range for patient.. Well-nourished and well-hydrated in no acute distress. Eyes conjunctiva clear no eyelid edema noted. pupils equal round and reactive to light and accommodation. Ears, Nose, Mouth, and Throat no gross abnormality of ear auricles or external auditory canals. normal hearing noted during conversation. mucus membranes moist. Respiratory normal breathing without difficulty. Musculoskeletal Patient unable to walk. no significant deformity or arthritic changes, no loss or range of motion, no clubbing. Psychiatric this patient is able to make decisions and demonstrates good insight into disease process. Alert and Oriented x 3. pleasant and cooperative. Notes Upon inspection patient's wound bed actually showed signs of seemingly good granulation in fact he had some hypergranulation tissue noted. I do not see any signs of active infection at this time the only real abnormal finding was that the extremity on the left appeared to be very cool compared to his upper leg at the distal portion of the amputation site below the knee on the left. This was again cool to touch. Otherwise it was the concern that he had in general with just feeling very lethargic with the headaches and even stated to feel short of breath. Electronic Signature(s) Signed: 05/21/2023 3:24:45 PM By: Bethena Ferraris PA-C Entered By: Bethena Ferraris on 05/21/2023  15:24:45 -------------------------------------------------------------------------------- Physician Orders Details Patient Name: Date of Service: [name], Joel NIEL G. 05/21/2023 2:00 PM Medical Record Number: [id number] Patient Account Number: [id number] Date of Birth/Sex: Treating RN: 03-10-1961 (62 y.o. NETTY Claudene Blossom Primary Care Provider: [name] Other Clinician: Referring Provider: Treating Provider/Extender: Bethena,  Andre Sayre, Jerona Weeks in Treatment: 0 The following information was scribed by: Claudene Blossom The information was scribed for: Bethena Andre Verbal / Phone Orders: No LAKER, THOMPSON ([id number]) [id number] Page 4 of 10 Diagnosis Coding ICD-10 Coding Code Description T81.31XA Disruption of external operation (surgical) wound, not elsewhere classified, initial encounter [phone] Acquired absence of left leg below knee E10.622 Type 1 diabetes mellitus with other skin ulcer L97.822 Non-pressure chronic ulcer of other part of left lower leg with fat layer exposed Z79.01 Long term (current) use of anticoagulants Follow-up Appointments Return Appointment in 1 week. Home Health Home Health Company: - [facility] ([place]) CONTINUE Home Health for wound care. May utilize formulary equivalent dressing for wound treatment orders unless otherwise specified. Home Health Nurse may visit PRN to address patients wound care needs. Bathing/ Shower/ Hygiene Clean wound with Normal Saline or wound cleanser. - May use vashe Wash wounds with antibacterial soap and water. Anesthetic (Use 'Patient Medications' Section for Anesthetic Order Entry) Lidocaine applied to wound bed Wound Treatment Wound #3 - Amputation Site - Below Knee Wound Laterality: Left, Anterior Cleanser: Soap and Water 3 x Per Week/30 Days Discharge Instructions: Gently cleanse wound with antibacterial soap, rinse and pat dry prior to dressing wounds Cleanser: Vashe 5.8 (oz) 3 x Per  Week/30 Days Discharge Instructions: Use vashe 5.8 (oz) as directed Prim Dressing: Hydrofera Blue Ready Transfer Foam, 4x5 (in/in) 3 x Per Week/30 Days ary Discharge Instructions: Apply Hydrofera Blue Ready to wound bed as directed Prim Dressing: Mepilex/Ag Foam Dressing, 6x6 (in/in) ary 3 x Per Week/30 Days Prim Dressing: Skin barrier wipes ary 3 x Per Week/30 Days Electronic Signature(s) Signed: 05/21/2023 4:43:18 PM By: Bethena Andre PA-C Signed: 05/21/2023 4:49:55 PM By: Claudene Blossom MSN RN CNS WTA Entered By: Claudene Blossom on 05/21/2023 15:58:06 -------------------------------------------------------------------------------- Problem List Details Patient Name: Date of Service: [name], Joel NIEL G. 05/21/2023 2:00 PM Medical Record Number: [id number] Patient Account Number: [id number] Date of Birth/Sex: Treating RN: 02-Jun-1960 (62 y.o. NETTY Claudene Blossom Primary Care Provider: Sayre Jerona Other Clinician: Referring Provider: Treating Provider/Extender: Bethena Andre Sayre Jerona Devra in Treatment: 0 Active Problems ICD-10 Encounter Code Description Active Date MDM KEENEN, ROESSNER ([id number]) [id number] Page 5 of 10 Code Description Active Date MDM Diagnosis T81.31XA Disruption of external operation (surgical) wound, not elsewhere classified, 05/21/2023 No Yes initial encounter [phone] Acquired absence of left leg below knee 05/21/2023 No Yes E10.622 Type 1 diabetes mellitus with other skin ulcer 05/21/2023 No Yes L97.822 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/13/2025 No Yes Z79.01 Long term (current) use of anticoagulants 05/21/2023 No Yes Inactive Problems Resolved Problems Electronic Signature(s) Signed: 05/21/2023 3:51:00 PM By: Claudene Blossom MSN RN CNS WTA Signed: 05/21/2023 4:43:18 PM By: Bethena Andre PA-C Previous Signature: 05/21/2023 3:00:36 PM Version By: Bethena Andre PA-C Previous Signature: 05/21/2023  2:58:41 PM Version By: Bethena Andre PA-C Entered By: Claudene Blossom on 05/21/2023 15:51:00 -------------------------------------------------------------------------------- Progress Note Details Patient Name: Date of Service: [name], Joel NIEL G. 05/21/2023 2:00 PM Medical Record Number: [id number] Patient Account Number: [id number] Date of Birth/Sex: Treating RN: [birth year] (62 y.o. NETTY Claudene Blossom Primary Care Provider: Sayre Jerona Other Clinician: Referring Provider: Treating Provider/Extender: Bethena Andre Sayre Jerona Devra in Treatment: 0 Subjective Chief Complaint Information obtained from Patient Left lower extremity amputation site wound History of Present Illness (HPI) Admission 05/25/2021 Mr. Murdock Jellison is a 63 year old male with a past medical history of uncontrolled type 1 diabetes on insulin   with last hemoglobin A1c of 8.8, peripheral arterial disease, status post left BKA that presents to the clinic for a left BKA amputation site wound. Patient developed a diabetic foot infection on 04/01/2021 and ultimately underwent a left BKA on 04/12/2021. The wound site became infected and he underwent an IandD on 05/07/2021. The wound was left open at that time. He has been using wet-to-dry dressings for the past 3 weeks. He currently denies Systemic signs of infection. Readmission: 05-21-2023 upon evaluation today patient presents for reevaluation here in the clinic although it has actually been since 2023 when he was last seen here in the clinic. At that point we pretty much gotten his wound healed based on what I saw and he was discharged unfortunately he ended up with an abscess or infection of this area and he had a surgery to going to an open up and clean out the left below-knee amputation site. He is here with his mother today. He tells me the wound seems to be doing well unfortunately he does not seem to be doing well in general. He tells me that he is having a lot of issues with  feeling poorly and in fact his exact description of what was going on was that I feel like I am dying. The symptoms that he was describing was headache, nausea and abdominal pain without being able to really use the bathroom and he has not vomited. He also tells me that generally speaking he just feels very lethargic and poor [name], [name] ([id number]) [id number] Page 6 of 10 overall. His vital signs were good today and did not show anything that seem to be problematic it was fairly reassuring as far as that was concerned. However in general I feel like that the symptoms and the way he is stating his issues at this point have me very concerned about the possibility of something more significant going on. It is just difficult to pinpoint. Patient did have his surgery to reopen this wound on November 29 and at that point he has been try to get this healed since that time. He also has history of diabetes mellitus type 1 he has a insulin  pump his most recent reading today was 175 does not seem to be way out of kilter. He also is on long-term anticoagulant therapy with Eliquis  and aspirin . Patient History Information obtained from Patient. Allergies Depakote  (Severity: Moderate), BuSpar (Severity: Mild), Plavix (Severity: Moderate), gabapentin (Severity: Moderate) Social History Former smoker - ended on 05/08/2013, Marital Status - Married, Alcohol Use - Never, Drug Use - No History, Caffeine Use - Rarely. Medical History Cardiovascular Patient has history of Coronary Artery Disease, Hypertension Endocrine Patient has history of Type I Diabetes Neurologic Denies history of Seizure Disorder Patient is treated with Insulin . Blood sugar is tested. Blood sugar results noted at the following times: Lunch - 175. Review of Systems (ROS) Constitutional Symptoms (General Health) Denies complaints or symptoms of Fatigue, Fever, Chills, Marked Weight Change. Eyes Denies  complaints or symptoms of Dry Eyes, Vision Changes, Glasses / Contacts. Ear/Nose/Mouth/Throat Denies complaints or symptoms of Difficult clearing ears, Sinusitis. Hematologic/Lymphatic Denies complaints or symptoms of Bleeding / Clotting Disorders, Human Immunodeficiency Virus. Respiratory Denies complaints or symptoms of Chronic or frequent coughs, Shortness of Breath. Gastrointestinal Denies complaints or symptoms of Frequent diarrhea, Nausea, Vomiting. Endocrine Complains or has symptoms of Thyroid  disease. Genitourinary Denies complaints or symptoms of Kidney failure/ Dialysis, Incontinence/dribbling. Immunological Denies complaints or symptoms of Hives, Itching. Integumentary (Skin) Denies complaints  or symptoms of Wounds, Bleeding or bruising tendency, Breakdown, Swelling. Musculoskeletal Denies complaints or symptoms of Muscle Pain, Muscle Weakness. Psychiatric Denies complaints or symptoms of Anxiety, Claustrophobia. Objective Constitutional patient is hypertensive.. pulse regular and within target range for patient.[name] respirations regular, non-labored and within target range for patient.[name] temperature within target range for patient.. Well-nourished and well-hydrated in no acute distress. Vitals Time Taken: 2:34 PM, Weight: 240 lbs, Source: Stated, Temperature: 98.5 F, Pulse: 80 bpm, Respiratory Rate: 18 breaths/min, Blood Pressure: 148/80 mmHg. Eyes conjunctiva clear no eyelid edema noted. pupils equal round and reactive to light and accommodation. Ears, Nose, Mouth, and Throat no gross abnormality of ear auricles or external auditory canals. normal hearing noted during conversation. mucus membranes moist. Respiratory normal breathing without difficulty. Musculoskeletal Patient unable to walk. no significant deformity or arthritic changes, no loss or range of motion, no clubbing. Psychiatric this patient is able to make decisions and demonstrates good insight into disease  process. Alert and Oriented x 3. pleasant and cooperative. General Notes: Upon inspection patient's wound bed actually showed signs of seemingly good granulation in fact he had some hypergranulation tissue noted. I do not see any signs of active infection at this time the only real abnormal finding was that the extremity on the left appeared to be very cool compared to his [name], [name] ([id number]) [id number] Page 7 of 10 upper leg at the distal portion of the amputation site below the knee on the left. This was again cool to touch. Otherwise it was the concern that he had in general with just feeling very lethargic with the headaches and even stated to feel short of breath. Integumentary (Hair, Skin) Wound #3 status is Open. Original cause of wound was Surgical Injury. The date acquired was: 04/06/2023. The wound is located on the Left,Anterior Amputation Site - Below Knee. The wound measures 3cm length x 7cm width x 0.1cm depth; 16.493cm^2 area and 1.649cm^3 volume. There is Fat Layer (Subcutaneous Tissue) exposed. There is no tunneling or undermining noted. There is a medium amount of serosanguineous drainage noted. There is medium (34-66%) red, pink granulation within the wound bed. There is no necrotic tissue within the wound bed. Assessment Active Problems ICD-10 Disruption of external operation (surgical) wound, not elsewhere classified, initial encounter Acquired absence of left leg below knee Type 1 diabetes mellitus with other skin ulcer Non-pressure chronic ulcer of other part of left lower leg with fat layer exposed Long term (current) use of anticoagulants Procedures Wound #3 Pre-procedure diagnosis of Wound #3 is an Open Surgical Wound located on the Left,Anterior Amputation Site - Below Knee . There was a Excisional Skin/Subcutaneous Tissue Debridement with a total area of 16.48 sq cm performed by Bethena Ferraris, PA-C. With the following instrument(s):  Curette to remove Viable and Non-Viable tissue/material. Material removed includes Subcutaneous Tissue and Slough and after achieving pain control using Lidocaine 4% T opical Solution. No specimens were taken. A time out was conducted at 15:09, prior to the start of the procedure. A Moderate amount of bleeding was controlled with Pressure. The procedure was tolerated well with a pain level of 0 throughout and a pain level of 0 following the procedure. Post Debridement Measurements: 3cm length x 7cm width x 0.1cm depth; 1.649cm^3 volume. Character of Wound/Ulcer Post Debridement is stable. Post procedure Diagnosis Wound #3: Same as Pre-Procedure Plan Follow-up Appointments: Return Appointment in 1 week. Home Health: Home Health Company: [facility] for [facility] care. May utilize formulary equivalent dressing for  wound treatment orders unless otherwise specified. Home Health Nurse may visit PRN to address patients wound care needs. Bathing/ Shower/ Hygiene: Clean wound with Normal Saline or wound cleanser. - May use vashe Wash wounds with antibacterial soap and water. Anesthetic (Use 'Patient Medications' Section for Anesthetic Order Entry): Lidocaine applied to wound bed WOUND #3: - Amputation Site - Below Knee Wound Laterality: Left, Anterior Cleanser: Soap and Water 3 x Per Week/30 Days Discharge Instructions: Gently cleanse wound with antibacterial soap, rinse and pat dry prior to dressing wounds Cleanser: Vashe 5.8 (oz) 3 x Per Week/30 Days Discharge Instructions: Use vashe 5.8 (oz) as directed Prim Dressing: Hydrofera Blue Ready Transfer Foam, 2.5x2.5 (in/in) 3 x Per Week/30 Days ary Discharge Instructions: Apply Hydrofera Blue Ready to wound bed as directed Secondary Dressing: ABD Pad 5x9 (in/in) 3 x Per Week/30 Days Discharge Instructions: Cover with ABD pad Secured With: Coban Cohesive Bandage 4x5 (yds) Stretched 3 x Per Week/30 Days Discharge Instructions: Apply Coban as  directed. Secured With: [employer] or Non-Sterile 6-ply 4.5x4 (yd/yd) 3 x Per Week/30 Days Discharge Instructions: Apply Kerlix as directed 1. Based on what I am seeing I believe that the patient is unfortunately doing somewhat poorly with regard to his general state of being. He tells me that he is feeling very lethargic, has a excruciating headache, has felt short of breath, has had nausea without vomiting and stomach pain without being able to defecate. In general he states that he is overall feeling was that I am dying. Again with the severity of the symptoms and the way he feels this has been going on for about 4 days I told him that I felt he should get to the ER for further evaluation and treatment he voiced understanding he is going to go upon discharge today. 2. With regard to the wound bed itself, go ahead and see about getting started with Hydrofera Blue this did well for him in the past when back at his notes and I would going to get this started currently. 3. With regard to when he is good to go to the ER he is actually leaving here to go straight there to call the triage nurse as well to advise of the patient coming he was in agreement with this plan. We will see patient back for reevaluation in 1 week here in the clinic. If anything worsens or changes patient will contact our office for additional recommendations. [name], [name] ([id number]) [id number] Page 8 of 10 Electronic Signature(s) Signed: 05/21/2023 3:28:26 PM By: Bethena Ferraris PA-C Entered By: Bethena Ferraris on 05/21/2023 15:28:26 -------------------------------------------------------------------------------- ROS/PFSH Details Patient Name: Date of Service: [name], Joel NIEL G. 05/21/2023 2:00 PM Medical Record Number: [id number] Patient Account Number: [id number] Date of Birth/Sex: Treating RN: 1961-02-20 (63 y.o. NETTY Claudene Blossom Primary Care Provider: [name] Other  Clinician: Referring Provider: Treating Provider/Extender: Bethena Ferraris [name] Devra in Treatment: 0 Information Obtained From Patient Constitutional Symptoms (General Health) Complaints and Symptoms: Negative for: Fatigue; Fever; Chills; Marked Weight Change Eyes Complaints and Symptoms: Negative for: Dry Eyes; Vision Changes; Glasses / Contacts Ear/Nose/Mouth/Throat Complaints and Symptoms: Negative for: Difficult clearing ears; Sinusitis Hematologic/Lymphatic Complaints and Symptoms: Negative for: Bleeding / Clotting Disorders; Human Immunodeficiency Virus Respiratory Complaints and Symptoms: Negative for: Chronic or frequent coughs; Shortness of Breath Gastrointestinal Complaints and Symptoms: Negative for: Frequent diarrhea; Nausea; Vomiting Endocrine Complaints and Symptoms: Positive for: Thyroid  disease Medical History: Positive for: Type I Diabetes Time with diabetes: 49 years Treated  with: Insulin  Blood sugar tested every day: Yes Tested : daily Blood sugar testing results: Lunch: 175 Genitourinary Complaints and Symptoms: Negative for: Kidney failure/ Dialysis; Incontinence/dribbling [name], [name] ([id number]) [id number] Page 9 of 10 Immunological Complaints and Symptoms: Negative for: Hives; Itching Integumentary (Skin) Complaints and Symptoms: Negative for: Wounds; Bleeding or bruising tendency; Breakdown; Swelling Musculoskeletal Complaints and Symptoms: Negative for: Muscle Pain; Muscle Weakness Psychiatric Complaints and Symptoms: Negative for: Anxiety; Claustrophobia Cardiovascular Medical History: Positive for: Coronary Artery Disease; Hypertension Neurologic Medical History: Negative for: Seizure Disorder Oncologic Immunizations Pneumococcal Vaccine: Received Pneumococcal Vaccination: No Implantable Devices None Family and Social History Former smoker - ended on 05/08/2013; Marital Status - Married;  Alcohol Use: Never; Drug Use: No History; Caffeine Use: Rarely Social Determinants of Health (SDOH) 1. In the past 2 months, did you or others you live with eat smaller meals or skip meals because you didn't have money for foodo : No 2. Are you homeless or worried that you might be in the futureo : No 3. Do you have trouble paying for your utilities (gas, electricity, phone)o : No 4. Do you have trouble finding or paying for a rideo : No 5. Do you need daycare, or better daycare, for your kidso : No 6. Are you unemployed or without regular incomeo : No 7. Do you need help finding a better jobo : No 8. Do you need help getting more educationo : No 9. Are you concerned about someone in your home using drugs or alcoholo : No 10. Do you feel unsafe in your daily lifeo : No 11. Is anyone in your home threatening or abusing youo : No 12. Do you lack quality relationships that make you feel valued and supportedo : No 13. Do you need help getting cultural information in a language you understando : No 14. Do you need help getting internet accesso : No Advanced Directives and Instructions Spiritual or Cultural beliefs preclude asking about Advance Care Planning: No Advanced Directives: No Patient wants information on Advanced Directives: No Do not resuscitate: No Living Will: No Medical Power of Attorney: No Surrogate Decision Maker: No Electronic Signature(s) Signed: 05/21/2023 4:43:18 PM By: Bethena Ferraris PA-C Signed: 05/21/2023 4:49:55 PM By: Claudene Blossom MSN RN CNS WTA Entered By: Claudene Blossom on 05/21/2023 14:42:46 [name] ([id number]) [id number] Page 10 of 10 -------------------------------------------------------------------------------- SuperBill Details Patient Name: Date of Service: [name] 05/21/2023 Medical Record Number: [id number] Patient Account Number: [id number] Date of Birth/Sex: Treating RN: 09-02-1960 (63 y.o. NETTY Claudene Blossom Primary Care Provider: [name] Other Clinician: Referring Provider: Treating Provider/Extender: Bethena Ferraris [name] Devra in Treatment: 0 Diagnosis Coding ICD-10 Codes Code Description T81.31XA Disruption of external operation (surgical) wound, not elsewhere classified, initial encounter Z89.512 Acquired absence of left leg below knee E10.622 Type 1 diabetes mellitus with other skin ulcer L97.822 Non-pressure chronic ulcer of other part of left lower leg with fat layer exposed Z79.01 Long term (current) use of anticoagulants Facility Procedures : CPT4 Code: 23899826 Description: 99213 - WOUND CARE VISIT-LEV 3 EST PT Modifier: Quantity: 1 : CPT4 Code: 63899987 Description: 11042 - DEB SUBQ TISSUE 20 SQ CM/< ICD-10 Diagnosis Description L97.822 Non-pressure chronic ulcer of other part of left lower leg with fat layer exposed Modifier: Quantity: 1 Physician Procedures : CPT4 Code Description Modifier [id number] 99205 - WC PHYS LEVEL 5 - NEW PT 25 ICD-10 Diagnosis Description T81.31XA Disruption of external operation (surgical) wound, not elsewhere classified, initial encounter S10.487 Acquired  absence of left leg below  knee E10.622 Type 1 diabetes mellitus with other skin ulcer L97.822 Non-pressure chronic ulcer of other part of left lower leg with fat layer exposed Quantity: 1 : 11042 11042 - WC PHYS SUBQ TISS 20 SQ CM ICD-10 Diagnosis Description L97.822 Non-pressure chronic ulcer of other part of left lower leg with fat layer exposed Quantity: 1 Electronic Signature(s) Signed: 05/21/2023 3:48:45 PM By: Claudene Blossom MSN RN CNS WTA Signed: 05/21/2023 4:43:18 PM By: Bethena Ferraris PA-C Previous Signature: 05/21/2023 3:28:47 PM Version By: Bethena Ferraris PA-C Entered By: Claudene Blossom on 05/21/2023 15:48:45

## 2023-05-23 NOTE — Progress Notes (Signed)
 SAMER, DUTTON ([id number]) [phone] [id number] Page 1 of 5 Visit Report for 05/21/2023 Abuse Risk Screen Details Patient Name: Date of Service: [name] 05/21/2023 2:00 PM Medical Record Number: [id number] Patient Account Number: [id number] Date of Birth/Sex: Treating RN: [birth year] (63 y.o. [name] Claudene Blossom Primary Care Yordan Martindale: [name] Other Clinician: Referring Ixchel Duck: Treating Asia Favata/Extender: [name] [name] Devra in Treatment: 0 Abuse Risk Screen Items Answer Electronic Signature(s) Signed: 05/21/2023 4:49:55 PM By: Claudene Blossom MSN RN CNS WTA Entered By: Claudene Blossom on 05/21/2023 14:43:01 -------------------------------------------------------------------------------- Activities of Daily Living Details Patient Name: Date of Service: [name], Joel NIEL G. 05/21/2023 2:00 PM Medical Record Number: [id number] Patient Account Number: [id number] Date of Birth/Sex: Treating RN: 1960/11/17 (62 y.o. [name] Claudene Blossom Primary Care Janace Decker: [name] Other Clinician: Referring Gurtaj Ruz: Treating [name]/Extender: [name] [name] Devra in Treatment: 0 Activities of Daily Living Items Answer Activities of Daily Living (Please select one for each item) Drive Automobile Not Able T Medications ake Completely Able Use T elephone Completely Able Care for Appearance Completely Able Use T oilet Completely Able Bath / Shower Completely Able Dress Self Completely Able Feed Self Completely Able Walk Need Assistance Get In / Out Bed Completely Able Housework Completely Able Prepare Meals Need Assistance Handle Money Need Assistance Shop for Self Need Assistance [name], [name] ([id number]) [id number] Page 2 of 5 Electronic Signature(s) Signed: 05/21/2023 4:49:55 PM By: Claudene Blossom MSN RN CNS WTA Entered By: Claudene Blossom on 05/21/2023  14:43:36 -------------------------------------------------------------------------------- Education Screening Details Patient Name: Date of Service: [name], Joel NIEL G. 05/21/2023 2:00 PM Medical Record Number: [id number] Patient Account Number: [id number] Date of Birth/Sex: Treating RN: 11-13-1960 (62 y.o. [name] Claudene Blossom Primary Care Nyelli Samara: [name] Other Clinician: Referring Zakkiyya Barno: Treating Dreux Mcgroarty/Extender: [name] [name] Devra in Treatment: 0 Learning Preferences/Education Level/Primary Language Learning Preference: Explanation, Demonstration Preferred Language: English Cognitive Barrier Language Barrier: No Translator Needed: No Memory Deficit: No Emotional Barrier: No Cultural/Religious Beliefs Affecting Medical Care: No Physical Barrier Impaired Vision: No Impaired Hearing: No Decreased Hand dexterity: No Knowledge/Comprehension Knowledge Level: High Comprehension Level: High Ability to understand written instructions: High Ability to understand verbal instructions: High Motivation Anxiety Level: Calm Cooperation: Cooperative Education Importance: Acknowledges Need Interest in Health Problems: Asks Questions Perception: Coherent Willingness to Engage in Self-Management High Activities: Readiness to Engage in Self-Management High Activities: Electronic Signature(s) Signed: 05/21/2023 4:49:55 PM By: Claudene Blossom MSN RN CNS WTA Entered By: Claudene Blossom on 05/21/2023 14:43:58 [name] ([id number]) [id number] Page 3 of 5 -------------------------------------------------------------------------------- Fall Risk Assessment Details Patient Name: Date of Service: [name], Joel NIEL G. 05/21/2023 2:00 PM Medical Record Number: [id number] Patient Account Number: [id number] Date of Birth/Sex: Treating RN: [birth year] (63 y.o. [name] Claudene Blossom Primary Care Trenell Moxey: [name] Other Clinician: Referring  [name]: Treating [name]/Extender: [name] [name] Devra in Treatment: 0 Fall Risk Assessment Items Have you had 2 or more falls in the last 12 monthso 0 No Have you had any fall that resulted in injury in the last 12 monthso 0 No FALLS RISK SCREEN History of falling - immediate or within 3 months 0 No Secondary diagnosis (Do you have 2 or more medical diagnoseso) 0 No Ambulatory aid None/bed rest/wheelchair/nurse 0 No Crutches/cane/walker 0 No Furniture 0 No Intravenous therapy Access/Saline/Heparin  Lock 0 No Gait/Transferring Normal/ bed rest/ wheelchair 0 Yes Weak (short steps with or without shuffle, stooped but able to lift head while walking, may seek 0  No support from furniture) Impaired (short steps with shuffle, may have difficulty arising from chair, head down, impaired 0 No balance) Mental Status Oriented to own ability 0 Yes Electronic Signature(s) Signed: 05/21/2023 4:49:55 PM By: Claudene Blossom MSN RN CNS WTA Entered By: Claudene Blossom on 05/21/2023 14:48:27 -------------------------------------------------------------------------------- Foot Assessment Details Patient Name: Date of Service: [name], Joel NIEL G. 05/21/2023 2:00 PM Medical Record Number: [id number] Patient Account Number: [id number] Date of Birth/Sex: Treating RN: 1960-11-01 (62 y.o. [name] Claudene Blossom Primary Care Jamieson Hetland: [name] Other Clinician: Referring Arshdeep Bolger: Treating Greidys Deland/Extender: [name] [name] Devra in Treatment: 0 Foot Assessment Items Site Locations MONTRE, HAR[place] ([id number]) [id number] Page 4 of 5 + = Sensation present, - = Sensation absent, C = Callus, U = Ulcer R = Redness, W = Warmth, M = Maceration, PU = Pre-ulcerative lesion F = Fissure, S = Swelling, D = Dryness Assessment Right: Left: Other Deformity: No No Prior Foot Ulcer: No No Prior Amputation: No No Charcot Joint: No No Ambulatory Status:  Non-ambulatory Assistance Device: Wheelchair Gait: Surveyor, [employer]) Signed: 05/21/2023 4:49:55 PM By: Claudene Blossom MSN RN CNS WTA Entered By: Claudene Blossom on 05/21/2023 14:49:00 -------------------------------------------------------------------------------- Nutrition Risk Screening Details Patient Name: Date of Service: [name], Joel NIEL G. 05/21/2023 2:00 PM Medical Record Number: [id number] Patient Account Number: [id number] Date of Birth/Sex: Treating RN: 14-Oct-1960 (62 y.o. [name] Claudene Blossom Primary Care Mysty Kielty: [name] Other Clinician: Referring Labrisha Wuellner: Treating Jamerius Boeckman/Extender: [name] [name] Devra in Treatment: 0 Height (in): Weight (lbs): 240 Body Mass Index (BMI): Nutrition Risk Screening Items Score Screening NUTRITION RISK SCREEN: I have an illness or condition that made me change the kind and/or amount of food I eat 0 No I eat fewer than two meals per day 0 No I eat few fruits and vegetables, or milk products 0 No I have three or more drinks of beer, liquor or wine almost every day 0 No I have tooth or mouth problems that make it hard for me to eat 0 No SEIYA, SILSBY ([id number]) [phone] [id number] Page 5 of 5 I don't always have enough money to buy the food I need 0 No I eat alone most of the time 0 No I take three or more different prescribed or over-the-counter drugs a day 1 Yes Without wanting to, I have lost or gained 10 pounds in the last six months 0 No I am not always physically able to shop, cook and/or feed myself 0 No Nutrition Protocols Good Risk Protocol 0 No interventions needed Moderate Risk Protocol High Risk Proctocol Risk Level: Good Risk Score: 1 Electronic Signature(s) Signed: 05/21/2023 4:49:55 PM By: Claudene Blossom MSN RN CNS WTA Entered By: Claudene Blossom on 05/21/2023 14:48:44

## 2023-05-24 ENCOUNTER — Other Ambulatory Visit: Payer: Self-pay | Admitting: Psychiatry

## 2023-05-24 ENCOUNTER — Encounter: Payer: Self-pay | Admitting: Gastroenterology

## 2023-05-24 DIAGNOSIS — F5101 Primary insomnia: Secondary | ICD-10-CM

## 2023-05-25 ENCOUNTER — Telehealth: Payer: Self-pay | Admitting: Psychiatry

## 2023-05-25 DIAGNOSIS — F5101 Primary insomnia: Secondary | ICD-10-CM

## 2023-05-25 MED ORDER — ZOLPIDEM TARTRATE 10 MG PO TABS: ORAL_TABLET | ORAL | 2 refills | Status: DC

## 2023-05-25 NOTE — Telephone Encounter (Signed)
I have sent Ambien to pharmacy. °

## 2023-05-25 NOTE — Telephone Encounter (Signed)
pt called states that he needs a refill on the [facility]. pt was last seen on 11-12 next appt 2-24

## 2023-05-27 DIAGNOSIS — Z930 Tracheostomy status: Secondary | ICD-10-CM | POA: Diagnosis not present

## 2023-05-27 DIAGNOSIS — Z9002 Acquired absence of larynx: Secondary | ICD-10-CM | POA: Diagnosis not present

## 2023-05-28 DIAGNOSIS — J9600 Acute respiratory failure, unspecified whether with hypoxia or hypercapnia: Secondary | ICD-10-CM | POA: Diagnosis not present

## 2023-05-28 DIAGNOSIS — F05 Delirium due to known physiological condition: Secondary | ICD-10-CM | POA: Diagnosis not present

## 2023-05-28 DIAGNOSIS — T8744 Infection of amputation stump, left lower extremity: Secondary | ICD-10-CM | POA: Diagnosis not present

## 2023-05-28 DIAGNOSIS — E1010 Type 1 diabetes mellitus with ketoacidosis without coma: Secondary | ICD-10-CM | POA: Diagnosis not present

## 2023-05-28 DIAGNOSIS — I4891 Unspecified atrial fibrillation: Secondary | ICD-10-CM | POA: Diagnosis not present

## 2023-05-28 DIAGNOSIS — E1029 Type 1 diabetes mellitus with other diabetic kidney complication: Secondary | ICD-10-CM | POA: Diagnosis not present

## 2023-05-28 DIAGNOSIS — U071 COVID-19: Secondary | ICD-10-CM | POA: Diagnosis not present

## 2023-05-28 DIAGNOSIS — Z7901 Long term (current) use of anticoagulants: Secondary | ICD-10-CM | POA: Diagnosis not present

## 2023-05-28 NOTE — Telephone Encounter (Signed)
Pt had already been notified.  ?

## 2023-05-29 ENCOUNTER — Encounter: Payer: HMO | Admitting: Physician Assistant

## 2023-05-29 DIAGNOSIS — T8131XA Disruption of external operation (surgical) wound, not elsewhere classified, initial encounter: Secondary | ICD-10-CM | POA: Diagnosis not present

## 2023-05-29 DIAGNOSIS — S81802A Unspecified open wound, left lower leg, initial encounter: Secondary | ICD-10-CM | POA: Diagnosis not present

## 2023-05-29 DIAGNOSIS — T81328A Disruption or dehiscence of closure of other specified internal operation (surgical) wound, initial encounter: Secondary | ICD-10-CM | POA: Diagnosis not present

## 2023-05-29 NOTE — OR Nursing (Signed)
MOTHER REPORTS PT HAS BEEN UNSTABLE . 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 REPORTS SHE FEELS LIKE PT NEEDS TO RE-SCHEDULE UNTIL STABLE. OFFICE NOTIFIED FOR RE-SCHEDULE WHEN STABLE

## 2023-05-29 NOTE — Progress Notes (Addendum)
[name] ([id number]) [id number] Page 1 of 7 Visit Report for 05/29/2023 Chief Complaint Document Details Patient Name: Date of Service: [name] 05/29/2023 2:15 PM Medical Record Number: [id number] Patient Account Number: [id number] Date of Birth/Sex: Treating RN: 01/21/1961 (63 y.o. [name] Primary Care Provider: Kandyce Rud Other Clinician: Referring Provider: Treating Provider/Extender: Brandy Hale in Treatment: 1 Information Obtained from: Patient Chief Complaint Left lower extremity amputation site wound Electronic Signature(s) Signed: 05/29/2023 2:23:01 PM By: Allen Derry PA-C Entered By: Allen Derry on 05/29/2023 14:23:01 -------------------------------------------------------------------------------- Debridement Details Patient Name: Date of Service: Joel RR, Joel NIEL G. 05/29/2023 2:15 PM Medical Record Number: [id number] Patient Account Number: [id number] Date of Birth/Sex: Treating RN: 08-02-1960 (62 y.o. [name] Primary Care Provider: Kandyce Rud Other Clinician: Referring Provider: Treating Provider/Extender: Brandy Hale in Treatment: 1 Debridement Performed for Assessment: Wound #3 Left,Anterior Amputation Site - Below Knee Performed By: Physician Allen Derry, PA-C The following information was scribed by: Angelina Pih The information was scribed for: Allen Derry Debridement Type: Debridement Level of Consciousness (Pre-procedure): Awake and Alert Pre-procedure Verification/Time Out Yes - 14:44 Taken: Pain Control: Lidocaine 4% T opical Solution Percent of Wound Bed Debrided: 100% T Area Debrided (cm): otal 18.84 Tissue and other material debrided: Viable, Non-Viable, Slough, Subcutaneous, Slough Level: Skin/Subcutaneous Tissue Debridement Description: Excisional Instrument: Curette Bleeding: Moderate Hemostasis Achieved: Pressure Response to  Treatment: Procedure was tolerated well Level of Consciousness [name], [name] ([id number]) [id number] Page 2 of 7 Level of Consciousness (Post- Awake and Alert procedure): Post Debridement Measurements of Total Wound Length: (cm) 3.2 Width: (cm) 7.5 Depth: (cm) 0.1 Volume: (cm) 1.885 Character of Wound/Ulcer Post Debridement: Stable Post Procedure Diagnosis Same as Pre-procedure Electronic Signature(s) Signed: 05/29/2023 4:29:01 PM By: Angelina Pih Signed: 05/29/2023 6:09:39 PM By: Allen Derry PA-C Entered By: Angelina Pih on 05/29/2023 14:46:28 -------------------------------------------------------------------------------- HPI Details Patient Name: Date of Service: [name], Joel NIEL G. 05/29/2023 2:15 PM Medical Record Number: [id number] Patient Account Number: [id number] Date of Birth/Sex: Treating RN: 01-20-1961 (62 y.o. [name] Primary Care Provider: Kandyce Rud Other Clinician: Referring Provider: Treating Provider/Extender: Brandy Hale in Treatment: 1 History of Present Illness HPI Description: Admission 05/25/2021 [name] is a 63 year old male with a past medical history of uncontrolled type 1 diabetes on insulin with last hemoglobin A1c of 8.8, peripheral arterial disease, status post left BKA that presents to the clinic for a left BKA amputation site wound. Patient developed a diabetic foot infection on 04/01/2021 and ultimately underwent a left BKA on 04/12/2021. The wound site became infected and he underwent an IandD on 05/07/2021. The wound was left open at that time. He has been using wet-to-dry dressings for the past 3 weeks. He currently denies Systemic signs of infection. Readmission: 05-21-2023 upon evaluation today patient presents for reevaluation here in the clinic although it has actually been since 2023 when he was last seen here in the clinic. At that point we pretty much gotten  his wound healed based on what I saw and he was discharged unfortunately he ended up with an abscess or infection of this area and he had a surgery to going to an open up and clean out the left below-knee amputation site. He is here with his mother today. He tells me the wound seems to be doing well unfortunately he does not seem to be doing well in general. He tells me that he is  having a lot of issues with feeling poorly and in fact his exact description of what was going on was that "I feel like I am dying". The symptoms that he was describing was headache, nausea and abdominal pain without being able to really use the bathroom and he has not vomited. He also tells me that generally speaking he just feels very lethargic and poor overall. His vital signs were good today and did not show anything that seem to be problematic it was fairly reassuring as far as that was concerned. However in general I feel like that the symptoms and the way he is stating his issues at this point have me very concerned about the possibility of something more significant going on. It is just difficult to pinpoint. Patient did have his surgery to reopen this wound on November 29 and at that point he has been try to get this healed since that time. He also has history of diabetes mellitus type 1 he has a insulin pump his most recent reading today was 175 does not seem to be way out of kilter. He also is on long-term anticoagulant therapy with Eliquis and aspirin. 05-29-2023 upon evaluation today patient appears to be doing excellent in regard to his wound. Has been tolerating the dressing changes without complication. Fortunately I do not see any signs of active infection at this time which is great news. No fevers, chills, nausea, vomiting, or diarrhea. Electronic Signature(s) Signed: 05/29/2023 2:49:58 PM By: Allen Derry PA-C Entered By: Allen Derry on 05/29/2023 14:49:58 [name] ([id number])  [id number] Page 3 of 7 -------------------------------------------------------------------------------- Physical Exam Details Patient Name: Date of Service: [name] 05/29/2023 2:15 PM Medical Record Number: [id number] Patient Account Number: [id number] Date of Birth/Sex: Treating RN: 1961/01/02 (63 y.o. [name] Primary Care Provider: Kandyce Rud Other Clinician: Referring Provider: Treating Provider/Extender: Brandy Hale in Treatment: 1 Constitutional Well-nourished and well-hydrated in no acute distress. Respiratory normal breathing without difficulty. Psychiatric this patient is able to make decisions and demonstrates good insight into disease process. Alert and Oriented x 3. pleasant and cooperative. Notes Upon inspection patient's wound bed actually showed signs of some minimal hypergranulation nothing too significant and I feel like that he is making good headway here towards closure which is great news. I do not see any evidence of active infection currently. Electronic Signature(s) Signed: 05/29/2023 2:50:14 PM By: Allen Derry PA-C Entered By: Allen Derry on 05/29/2023 14:50:14 -------------------------------------------------------------------------------- Physician Orders Details Patient Name: Date of Service: Joel RR, Joel NIEL G. 05/29/2023 2:15 PM Medical Record Number: [id number] Patient Account Number: [id number] Date of Birth/Sex: Treating RN: 1960-10-17 (62 y.o. [name] Primary Care Provider: Kandyce Rud Other Clinician: Referring Provider: Treating Provider/Extender: Brandy Hale in Treatment: 1 The following information was scribed by: Angelina Pih The information was scribed for: Allen Derry Verbal / Phone Orders: No Diagnosis Coding ICD-10 Coding Code Description T81.31XA Disruption of external operation (surgical) wound, not elsewhere classified,  initial encounter Z89.512 Acquired absence of left leg below knee E10.622 Type 1 diabetes mellitus with other skin ulcer L97.822 Non-pressure chronic ulcer of other part of left lower leg with fat layer exposed Z79.01 Long term (current) use of anticoagulants [name], [name] ([id number]) [id number] Page 4 of 7 Follow-up Appointments Return Appointment in 1 week. Home Health Home Health Company: - [facility] ([place]) CONTINUE Home Health for wound care. May utilize formulary equivalent dressing for wound treatment orders unless otherwise specified.  Home Health Nurse may visit PRN to address patients wound care needs. Bathing/ Shower/ Hygiene Clean wound with Normal Saline or wound cleanser. - May use vashe Wash wounds with antibacterial soap and water. Anesthetic (Use 'Patient Medications' Section for Anesthetic Order Entry) Lidocaine applied to wound bed Wound Treatment Wound #3 - Amputation Site - Below Knee Wound Laterality: Left, Anterior Cleanser: Soap and Water 3 x Per Week/30 Days Discharge Instructions: Gently cleanse wound with antibacterial soap, rinse and pat dry prior to dressing wounds Cleanser: Vashe 5.8 (oz) 3 x Per Week/30 Days Discharge Instructions: Use vashe 5.8 (oz) as directed Prim Dressing: Hydrofera Blue Ready Transfer Foam, 4x5 (in/in) 3 x Per Week/30 Days ary Discharge Instructions: Apply Hydrofera Blue Ready to wound bed as directed Prim Dressing: Mepilex/Ag Foam Dressing, 6x6 (in/in) ary 3 x Per Week/30 Days Prim Dressing: Skin barrier wipes ary 3 x Per Week/30 Days Electronic Signature(s) Signed: 05/29/2023 4:29:01 PM By: Angelina Pih Signed: 05/29/2023 6:09:39 PM By: Allen Derry PA-C Entered By: Angelina Pih on 05/29/2023 14:47:21 -------------------------------------------------------------------------------- Problem List Details Patient Name: Date of Service: [name], Joel NIEL G. 05/29/2023 2:15 PM Medical Record Number:  [id number] Patient Account Number: [id number] Date of Birth/Sex: Treating RN: 07-Mar-1961 (62 y.o. 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 05/29/2023 2:15 PM Medical Record Number: [id number] Patient Account Number: [id number] Date of Birth/Sex: Treating RN: 23-Mar-1961 (63 y.o. [name] Primary Care Provider: Kandyce Rud Other Clinician: Referring Provider: Treating Provider/Extender: Brandy Hale in Treatment: 1 Subjective Chief Complaint Information obtained from Patient Left lower extremity amputation site wound History of Present Illness (HPI) Admission 05/25/2021 [name] is a 63 year old male with a past medical history of uncontrolled type 1 diabetes on insulin with last hemoglobin A1c of 8.8, peripheral arterial disease, status post left BKA that presents to the clinic for a left BKA amputation site wound. Patient developed  a diabetic foot infection on 04/01/2021 and ultimately underwent a left BKA on 04/12/2021. The wound site became infected and he underwent an IandD on 05/07/2021. The wound was left open at that time. He has been using wet-to-dry dressings for the past 3 weeks. He currently denies Systemic signs of infection. Readmission: 05-21-2023 upon evaluation today patient presents for reevaluation here in the clinic although it has actually been since 2023 when he was last seen here in the clinic. At that point we pretty much gotten his wound healed based on what I saw and he was discharged unfortunately he ended up with an abscess or infection of this area and he had a surgery to going to an open up and clean out the left below-knee amputation site. He is here with his mother today. He  tells me the wound seems to be doing well unfortunately he does not seem to be doing well in general. He tells me that he is having a lot of issues with feeling poorly and in fact his exact description of what was going on was that "I feel like I am dying". The symptoms that he was describing was headache, nausea and abdominal pain without being able to really use the bathroom and he has not vomited. He also tells me that generally speaking he just feels very lethargic and poor overall. His vital signs were good today and did not show anything that seem to be problematic it was fairly reassuring as far as that was concerned. However in general I feel like that the symptoms and the way he is stating his issues at this point have me very concerned about the possibility of something more significant going on. It is just difficult to pinpoint. Patient did have his surgery to reopen this wound on November 29 and at that point he has been try to get this healed since that time. He also has history of diabetes mellitus type 1 he has a insulin pump his most recent reading today was 175 does not seem to be way out of kilter. He also is on  long-term anticoagulant therapy with Eliquis and aspirin. 05-29-2023 upon evaluation today patient appears to be doing excellent in regard to his wound. Has been tolerating the dressing changes without complication. Fortunately I do not see any signs of active infection at this time which is great news. No fevers, chills, nausea, vomiting, or diarrhea. [name], [name] ([id number]) [id number] Page 6 of 7 Objective Constitutional Well-nourished and well-hydrated in no acute distress. Vitals Time Taken: 2:15 PM, Weight: 240 lbs, Temperature: 98.1 F, Pulse: 83 bpm, Respiratory Rate: 18 breaths/min, Blood Pressure: 147/84 mmHg. Respiratory normal breathing without difficulty. Psychiatric this patient is able to make decisions and demonstrates good insight into disease process. Alert and Oriented x 3. pleasant and cooperative. General Notes: Upon inspection patient's wound bed actually showed signs of some minimal hypergranulation nothing too significant and I feel like that he is making good headway here towards closure which is great news. I do not see any evidence of active infection currently. Integumentary (Hair, Skin) Wound #3 status is Open. Original cause of wound was Surgical Injury. The date acquired was: 04/06/2023. The wound has been in treatment 1 weeks. The wound is located on the Left,Anterior Amputation Site - Below Knee. The wound measures 3.2cm length x 7.5cm width x 0.1cm depth; 18.85cm^2 area and 1.885cm^3 volume. There is Fat Layer (Subcutaneous Tissue) exposed. There is no tunneling or undermining noted. There is a medium amount of serosanguineous drainage noted. There is medium (34-66%) red, pink granulation within the wound bed. There is a small (1-33%) amount of necrotic tissue within the wound bed including Adherent Slough. Assessment Active Problems ICD-10 Disruption of external operation (surgical) wound, not elsewhere classified, initial  encounter Acquired absence of left leg below knee Type 1 diabetes mellitus with other skin ulcer Non-pressure chronic ulcer of other part of left lower leg with fat layer exposed Long term (current) use of anticoagulants Procedures Wound #3 Pre-procedure diagnosis of Wound #3 is an Open Surgical Wound located on the Left,Anterior Amputation Site - Below Knee . There was a Excisional Skin/Subcutaneous Tissue Debridement with a total area of 18.84 sq cm performed by Allen Derry, PA-C. With the following instrument(s): Curette to remove Viable and Non-Viable tissue/material. Material removed includes  Subcutaneous Tissue and Slough and after achieving pain control using Lidocaine 4% T opical Solution. No specimens were taken. A time out was conducted at 14:44, prior to the start of the procedure. A Moderate amount of bleeding was controlled with Pressure. The procedure was tolerated well. Post Debridement Measurements: 3.2cm length x 7.5cm width x 0.1cm depth; 1.885cm^3 volume. Character of Wound/Ulcer Post Debridement is stable. Post procedure Diagnosis Wound #3: Same as Pre-Procedure Plan Follow-up Appointments: Return Appointment in 1 week. Home Health: Home Health Company: - [facility] ([place]) CONTINUE Home Health for wound care. May utilize formulary equivalent dressing for wound treatment orders unless otherwise specified. Home Health Nurse may visit PRN to address patients wound care needs. Bathing/ Shower/ Hygiene: Clean wound with Normal Saline or wound cleanser. - May use vashe Wash wounds with antibacterial soap and water. Anesthetic (Use 'Patient Medications' Section for Anesthetic Order Entry): Lidocaine applied to wound bed WOUND #3: - Amputation Site - Below Knee Wound Laterality: Left, Anterior Cleanser: Soap and Water 3 x Per Week/30 Days Discharge Instructions: Gently cleanse wound with antibacterial soap, rinse and pat dry prior to dressing wounds Cleanser: Vashe 5.8 (oz) 3  x Per Week/30 Days Discharge Instructions: Use vashe 5.8 (oz) as directed Prim Dressing: Hydrofera Blue Ready Transfer Foam, 4x5 (in/in) 3 x Per Week/30 Days ary Discharge Instructions: Apply Hydrofera Blue Ready to wound bed as directed Prim Dressing: Mepilex/Ag Foam Dressing, 6x6 (in/in) 3 x Per Week/30 Days ary Prim Dressing: Skin barrier wipes 3 x Per Week/30 Days [name], [name] ([id number]) [id number] Page 7 of 7 1. I would recommend based on what was seen postdebridement wound bed appears to be doing significantly better there is very little slough and biofilm noted at this point and in general I think that the patient is making excellent headway towards closure. We are gena continue currently with the [facility] is a feel like that is doing the best. 2. I would recommend as well patient should continue to monitor for any signs of infection if anything changes he knows contact the office let me know. We will see patient back for reevaluation in 1 week here in the clinic. If anything worsens or changes patient will contact our office for additional recommendations. Electronic Signature(s) Signed: 05/29/2023 2:50:42 PM By: Allen Derry PA-C Entered By: Allen Derry on 05/29/2023 14:50:42 -------------------------------------------------------------------------------- SuperBill Details Patient Name: Date of Service: Joel RR, Joel NIEL G. 05/29/2023 Medical Record Number: [id number] Patient Account Number: [id number] Date of Birth/Sex: Treating RN: 08/09/1960 (63 y.o. [name] Primary Care Provider: Kandyce Rud Other Clinician: Referring Provider: Treating Provider/Extender: Brandy Hale in Treatment: 1 Diagnosis Coding ICD-10 Codes Code Description T81.31XA Disruption of external operation (surgical) wound, not elsewhere classified, initial encounter Z89.512 Acquired absence of left leg below knee E10.622 Type 1  diabetes mellitus with other skin ulcer L97.822 Non-pressure chronic ulcer of other part of left lower leg with fat layer exposed Z79.01 Long term (current) use of anticoagulants Facility Procedures : CPT4 Code: [id number] Description: 11042 - DEB SUBQ TISSUE 20 SQ CM/< ICD-10 Diagnosis Description L97.822 Non-pressure chronic ulcer of other part of left lower leg with fat layer expos Modifier: ed Quantity: 1 Physician Procedures : CPT4 Code Description Modifier 11042 11042 - WC PHYS SUBQ TISS 20 SQ CM ICD-10 Diagnosis Description L97.822 Non-pressure chronic ulcer of other part of left lower leg with fat layer exposed Quantity: 1 Electronic Signature(s) Signed: 05/29/2023 2:53:08 PM By: Allen Derry PA-C Entered By: Larina Bras,  Tanea Moga on 05/29/2023 14:53:08

## 2023-05-29 NOTE — Progress Notes (Signed)
CINCH, RIPPLE ([id number]) [id number] Page 1 of 8 Visit Report for 05/29/2023 Arrival Information Details Patient Name: Date of Service: [name] 05/29/2023 2:15 PM Medical Record Number: [id number] Patient Account Number: [id number] Date of Birth/Sex: Treating RN: [birth year] (63 y.o. [name] Primary Care Tresha Muzio: Kandyce Rud Other Clinician: Referring Giana Castner: Treating Sherrel Ploch/Extender: Brandy Hale in Treatment: 1 Visit Information History Since Last Visit Added or deleted any medications: No Patient Arrived: Wheel Chair Any new allergies or adverse reactions: No Arrival Time: 14:15 Had a fall or experienced change in No Accompanied By: family activities of daily living that may affect Transfer Assistance: None risk of falls: Patient Identification Verified: Yes Hospitalized since last visit: No Secondary Verification Process Completed: Yes Has Dressing in Place as Prescribed: Yes Patient Requires Transmission-Based Precautions: No Pain Present Now: No Patient Has Alerts: Yes Patient Alerts: Patient on Blood Thinner Diabetic type 1 Eliquis Electronic Signature(s) Signed: 05/29/2023 4:29:01 PM By: Angelina Pih Entered By: Angelina Pih on 05/29/2023 14:15:44 -------------------------------------------------------------------------------- Clinic Level of Care Assessment Details Patient Name: Date of Service: [name] 05/29/2023 2:15 PM Medical Record Number: [id number] Patient Account Number: [id number] Date of Birth/Sex: Treating RN: 01-15-1961 (63 y.o. [name] Primary Care Hudsyn Barich: Kandyce Rud Other Clinician: Referring Anberlyn Feimster: Treating Arika Mainer/Extender: Brandy Hale in Treatment: 1 Clinic Level of Care Assessment Items TOOL 1 Quantity Score []  - 0 Use when EandM and Procedure is performed on INITIAL visit ASSESSMENTS - Nursing Assessment /  Reassessment []  - 0 General Physical Exam (combine w/ comprehensive assessment (listed just below) when performed on new pt. evals) []  - 0 Comprehensive Assessment (HX, ROS, Risk Assessments, Wounds Hx, etc.) ASSESSMENTS - Wound and Skin Assessment / Reassessment []  - 0 Dermatologic / Skin Assessment (not related to wound area) SA[facility], WIGGINTON ([id number]) [id number] Page 2 of 8 ASSESSMENTS - Ostomy and/or Continence Assessment and Care []  - 0 Incontinence Assessment and Management []  - 0 Ostomy Care Assessment and Management (repouching, etc.) PROCESS - Coordination of Care []  - 0 Simple Patient / Family Education for ongoing care []  - 0 Complex (extensive) Patient / Family Education for ongoing care []  - 0 Staff obtains Chiropractor, Records, T Results / Process Orders est []  - 0 Staff telephones HHA, Nursing Homes / Clarify orders / etc []  - 0 Routine Transfer to another Facility (non-emergent condition) []  - 0 Routine Hospital Admission (non-emergent condition) []  - 0 New Admissions / Manufacturing engineer / Ordering NPWT Apligraf, etc. , []  - 0 Emergency Hospital Admission (emergent condition) PROCESS - Special Needs []  - 0 Pediatric / Minor Patient Management []  - 0 Isolation Patient Management []  - 0 Hearing / Language / Visual special needs []  - 0 Assessment of Community assistance (transportation, D/C planning, etc.) []  - 0 Additional assistance / Altered mentation []  - 0 Support Surface(s) Assessment (bed, cushion, seat, etc.) INTERVENTIONS - Miscellaneous []  - 0 External ear exam []  - 0 Patient Transfer (multiple staff / Nurse, adult / Similar devices) []  - 0 Simple Staple / Suture removal (25 or less) []  - 0 Complex Staple / Suture removal (26 or more) []  - 0 Hypo/Hyperglycemic Management (do not check if billed separately) []  - 0 Ankle / Brachial Index (ABI) - do not check if billed separately Has the patient been seen at  the hospital within the last three years: Yes Total Score: 0 Level Of Care: ____ Electronic Signature(s) Signed: 05/29/2023 4:29:01 PM By:  Angelina Pih Entered By: Angelina Pih on 05/29/2023 16:26:56 -------------------------------------------------------------------------------- Encounter Discharge Information Details Patient Name: Date of Service: [name] 05/29/2023 2:15 PM Medical Record Number: [id number] Patient Account Number: [id number] Date of Birth/Sex: Treating RN: 05-Jul-1960 (63 y.o. [name] Primary Care Elonda Giuliano: Kandyce Rud Other Clinician: Referring Renezmae Canlas: Treating Kolin Erdahl/Extender: Brandy Hale in Treatment: 1 Encounter Discharge Information Items Post Procedure CARDIER, KOOB ([id number]) [id number] Page 3 of 8 Discharge Condition: Stable Temperature (F): 98.1 Ambulatory Status: Wheelchair Pulse (bpm): 83 Discharge Destination: Home Respiratory Rate (breaths/min): 18 Transportation: Private Auto Blood Pressure (mmHg): 147/84 Accompanied By: family Schedule Follow-up Appointment: Yes Clinical Summary of Care: Electronic Signature(s) Signed: 05/29/2023 4:28:18 PM By: Angelina Pih Entered By: Angelina Pih on 05/29/2023 16:28:18 -------------------------------------------------------------------------------- Lower Extremity Assessment Details Patient Name: Date of Service: [name], Joel NIEL G. 05/29/2023 2:15 PM Medical Record Number: [id number] Patient Account Number: [id number] Date of Birth/Sex: Treating RN: 07-01-1960 (62 y.o. [name] Primary Care Baileigh Modisette: Kandyce Rud Other Clinician: Referring Shaneque Merkle: Treating Gwenetta Devos/Extender: Cherre Huger Weeks in Treatment: 1 Notes LLL BKA Electronic Signature(s) Signed: 05/29/2023 4:29:01 PM By: Angelina Pih Entered By: Angelina Pih on 05/29/2023  14:42:00 -------------------------------------------------------------------------------- Multi Wound Chart Details Patient Name: Date of Service: [name], Joel NIEL G. 05/29/2023 2:15 PM Medical Record Number: [id number] Patient Account Number: [id number] Date of Birth/Sex: Treating RN: 1960/07/21 (62 y.o. [name] Primary Care Jacklyne Baik: Kandyce Rud Other Clinician: Referring Bhavik Cabiness: Treating Elmon Shader/Extender: Brandy Hale in Treatment: 1 Vital Signs Height(in): Pulse(bpm): 83 Weight(lbs): 240 Blood Pressure(mmHg): 147/84 Body Mass Index(BMI): Temperature(F): 98.1 Respiratory Rate(breaths/min): [address] JYE, MOOS ([id number]) [3:Photos:] [N/A:N/A] Left, Anterior Amputation Site - Below N/A N/A Wound Location: Knee Surgical Injury N/A N/A Wounding Event: Open Surgical Wound N/A N/A Primary Etiology: Coronary Artery Disease, N/A N/A Comorbid History: Hypertension, Type I Diabetes 04/06/2023 N/A N/A Date Acquired: 1 N/A N/A Weeks of Treatment: Open N/A N/A Wound Status: No N/A N/A Wound Recurrence: 3.2x7.5x0.1 N/A N/A Measurements L x W x D (cm) 18.85 N/A N/A A (cm) : rea 1.885 N/A N/A Volume (cm) : -14.30% N/A N/A % Reduction in Area: -14.30% N/A N/A % Reduction in Volume: Full Thickness Without Exposed N/A N/A Classification: Support Structures Medium N/A N/A Exudate Amount: Serosanguineous N/A N/A Exudate Type: red, brown N/A N/A Exudate Color: Medium (34-66%) N/A N/A Granulation Amount: Red, Pink N/A N/A Granulation Quality: Small (1-33%) N/A N/A Necrotic Amount: Fat Layer (Subcutaneous Tissue): Yes N/A N/A Exposed Structures: Fascia: No Tendon: No Muscle: No Joint: No Bone: No Medium (34-66%) N/A N/A Epithelialization: Treatment Notes Electronic Signature(s) Signed: 05/29/2023 4:29:01 PM By: Angelina Pih Entered By: Angelina Pih on 05/29/2023  14:42:13 -------------------------------------------------------------------------------- Multi-Disciplinary Care Plan Details Patient Name: Date of Service: [name], Joel NIEL G. 05/29/2023 2:15 PM Medical Record Number: [id number] Patient Account Number: [id number] Date of Birth/Sex: Treating RN: Dec 14, 1960 (62 y.o. [name] Primary Care Belem Hintze: Kandyce Rud Other Clinician: Referring Tommaso Cavitt: Treating Demetrie Borge/Extender: Brandy Hale in Treatment: 1 Active Inactive Necrotic Tissue Nursing Diagnoses: Impaired tissue integrity related to necrotic/devitalized tissue UTKARSH, RIESSEN ([id number]) [phone] Page 5 of 8 Knowledge deficit related to management of necrotic/devitalized tissue Goals: Necrotic/devitalized tissue will be minimized in the wound bed Date Initiated: 05/21/2023 Target Resolution Date: 06/04/2023 Goal Status: Active Patient/caregiver will verbalize understanding of reason and process for debridement of necrotic tissue Date Initiated: 05/21/2023 Target Resolution Date: 06/04/2023 Goal Status: Active Interventions: Assess  patient pain level pre-, during and post procedure and prior to discharge Provide education on necrotic tissue and debridement process Treatment Activities: Apply topical anesthetic as ordered : 05/21/2023 Excisional debridement : 05/21/2023 Notes: Wound/Skin Impairment Nursing Diagnoses: Impaired tissue integrity Knowledge deficit related to ulceration/compromised skin integrity Goals: Ulcer/skin breakdown will have a volume reduction of 30% by week 4 Date Initiated: 05/21/2023 Target Resolution Date: 06/21/2023 Goal Status: Active Ulcer/skin breakdown will have a volume reduction of 50% by week 8 Date Initiated: 05/21/2023 Target Resolution Date: 07/19/2023 Goal Status: Active Ulcer/skin breakdown will have a volume reduction of 80% by week 12 Date Initiated: 05/21/2023 Target Resolution  Date: 08/19/2023 Goal Status: Active Ulcer/skin breakdown will heal within 14 weeks Date Initiated: 05/21/2023 Target Resolution Date: 09/02/2023 Goal Status: Active Interventions: Assess patient/caregiver ability to obtain necessary supplies Assess patient/caregiver ability to perform ulcer/skin care regimen upon admission and as needed Assess ulceration(s) every visit Provide education on ulcer and skin care Notes: Electronic Signature(s) Signed: 05/29/2023 4:27:28 PM By: Angelina Pih Entered By: Angelina Pih on 05/29/2023 16:27:28 -------------------------------------------------------------------------------- Pain Assessment Details Patient Name: Date of Service: [name], Joel NIEL G. 05/29/2023 2:15 PM Medical Record Number: [id number] Patient Account Number: [id number] Date of Birth/Sex: Treating RN: 1960/07/21 (63 y.o. [name] Primary Care Breannah Kratt: Kandyce Rud Other Clinician: Referring Ambrie Carte: Treating Kinzleigh Kandler/Extender: Brandy Hale in Treatment: [address] JERYN, GALLIGHER ([id number]) [id number] Page 6 of 8 Location of Pain Severity and Description of Pain Patient Has Paino No Site Locations Pain Management and Medication Current Pain Management: Electronic Signature(s) Signed: 05/29/2023 4:29:01 PM By: Angelina Pih Entered By: Angelina Pih on 05/29/2023 14:16:08 -------------------------------------------------------------------------------- Patient/Caregiver Education Details Patient Name: Date of Service: [name], [name] 1/21/2025andnbsp2:15 PM Medical Record Number: [id number] Patient Account Number: [id number] Date of Birth/Gender: Treating RN: September 02, 1960 (63 y.o. [name] Primary Care Physician: Kandyce Rud Other Clinician: Referring Physician: Treating Physician/Extender: Brandy Hale in Treatment: 1 Education Assessment Education Provided  To: Patient Education Topics Provided Wound Debridement: Handouts: Wound Debridement Methods: Explain/Verbal Responses: State content correctly Wound/Skin Impairment: Handouts: Caring for Your Ulcer Methods: Explain/Verbal Responses: State content correctly Electronic Signature(s) Signed: 05/29/2023 4:29:01 PM By: Angelina Pih Entered By: Angelina Pih on 05/29/2023 16:27:41 Hansel Feinstein ([id number]) [id number] Page 7 of 8 -------------------------------------------------------------------------------- Wound Assessment Details Patient Name: Date of Service: [name] 05/29/2023 2:15 PM Medical Record Number: [id number] Patient Account Number: [id number] Date of Birth/Sex: Treating RN: 05/15/1960 (63 y.o. [name] Primary Care Loren Sawaya: Kandyce Rud Other Clinician: Referring Tejah Brekke: Treating Richie Bonanno/Extender: Brandy Hale in Treatment: 1 Wound Status Wound Number: 3 Primary Etiology: Open Surgical Wound Wound Location: Left, Anterior Amputation Site - Below Knee Wound Status: Open Wounding Event: Surgical Injury Comorbid History: Coronary Artery Disease, Hypertension, Type I Diabetes Date Acquired: 04/06/2023 Weeks Of Treatment: 1 Clustered Wound: No Photos Wound Measurements Length: (cm) 3. Width: (cm) 7. Depth: (cm) 0. Area: (cm) 1 Volume: (cm) 1 2 % Reduction in Area: -14.3% 5 % Reduction in Volume: -14.3% 1 Epithelialization: Medium (34-66%) 8.85 Tunneling: No .885 Undermining: No Wound Description Classification: Full Thickness Without Exposed Suppor Exudate Amount: Medium Exudate Type: Serosanguineous Exudate Color: red, brown t Structures Foul Odor After Cleansing: No Slough/Fibrino No Wound Bed Granulation Amount: Medium (34-66%) Exposed Structure Granulation Quality: Red, Pink Fascia Exposed: No Necrotic Amount: Small (1-33%) Fat Layer (Subcutaneous Tissue) Exposed:  Yes Necrotic Quality: Adherent Slough Tendon Exposed: No Muscle Exposed:  No Joint Exposed: No Bone Exposed: No Treatment Notes 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) Wound Laterality: Left, Anterior Cleanser Soap and [address] TRAITON, LINGG ([id number]) [id number] Page 8 of 8 Discharge Instruction: Gently cleanse wound with antibacterial soap, rinse and pat dry prior to dressing wounds Vashe 5.8 (oz) Discharge Instruction: Use vashe 5.8 (oz) as directed Peri-Wound Care Topical Primary Dressing Hydrofera Blue Ready Transfer Foam, 4x5 (in/in) Discharge Instruction: Apply Hydrofera Blue Ready to wound bed as directed Mepilex/Ag Foam Dressing, 6x6 (in/in) Skin barrier wipes Secondary Dressing Secured With Compression Wrap Compression Stockings Add-Ons Electronic Signature(s) Signed: 05/29/2023 4:29:01 PM By: Angelina Pih Entered By: Angelina Pih on 05/29/2023 14:22:49 -------------------------------------------------------------------------------- Vitals Details Patient Name: Date of Service: [name], Joel NIEL G. 05/29/2023 2:15 PM Medical Record Number: [id number] Patient Account Number: [id number] Date of Birth/Sex: Treating RN: 1961-02-17 (62 y.o. [name] Primary Care Lewin Pellow: Kandyce Rud Other Clinician: Referring Braun Rocca: Treating Posey Jasmin/Extender: Brandy Hale in Treatment: 1 Vital Signs Time Taken: 14:15 Temperature (F): 98.1 Weight (lbs): 240 Pulse (bpm): 83 Respiratory Rate (breaths/min): 18 Blood Pressure (mmHg): 147/84 Reference Range: 80 - 120 mg / dl Electronic Signature(s) Signed: 05/29/2023 4:29:01 PM By: Angelina Pih Entered By: Angelina Pih on 05/29/2023 14:16:03

## 2023-05-30 ENCOUNTER — Telehealth (HOSPITAL_COMMUNITY): Payer: Self-pay | Admitting: Licensed Clinical Social Worker

## 2023-05-30 ENCOUNTER — Telehealth: Payer: PPO | Admitting: Psychiatry

## 2023-05-30 ENCOUNTER — Other Ambulatory Visit: Payer: Self-pay | Admitting: Psychiatry

## 2023-05-30 ENCOUNTER — Encounter: Payer: Self-pay | Admitting: Psychiatry

## 2023-05-30 DIAGNOSIS — F411 Generalized anxiety disorder: Secondary | ICD-10-CM

## 2023-05-30 DIAGNOSIS — F332 Major depressive disorder, recurrent severe without psychotic features: Secondary | ICD-10-CM

## 2023-05-30 DIAGNOSIS — F5101 Primary insomnia: Secondary | ICD-10-CM | POA: Diagnosis not present

## 2023-05-30 DIAGNOSIS — Z634 Disappearance and death of family member: Secondary | ICD-10-CM

## 2023-05-30 MED ORDER — OLANZAPINE 5 MG PO TABS: 5.0000 mg | ORAL_TABLET | Freq: Every day | ORAL | 1 refills | Status: DC

## 2023-05-30 NOTE — Telephone Encounter (Signed)
Cln called to f/u on PHP referral. Cln oriented pt to PHP and pt expressed concern about being able to commit to the schedule and gave the phone to his mother. Pt's mother explained that pt gets upset when he feels like he has to do something and he can't do it. Cln explained that PHP is voluntary and that Dr. Elna Breslow thought it might be beneficial. Cln answered questions and provided PHP phone number [phone]) for them to call if and when pt is interested in being assessed.

## 2023-05-30 NOTE — Progress Notes (Unsigned)
This note is not being shared with the patient for the following reason: To prevent harm (release of this note would result in harm to the life or physical safety of the patient or another).  Virtual Visit via Video Note  I connected with [name] on 05/30/23 at  2:00 PM EST by a video enabled telemedicine application and verified that I am speaking with the correct person using two identifiers.  Location Provider Location : [facility] Patient Location : Home  Participants: Patient , Provider   I discussed the limitations of evaluation and management by telemedicine and the availability of in person appointments. The patient expressed understanding and agreed to proceed.    I discussed the assessment and treatment plan with the patient. The patient was provided an opportunity to ask questions and all were answered. The patient agreed with the plan and demonstrated an understanding of the instructions.   The patient was advised to call back or seek an in-person evaluation if the symptoms worsen or if the condition fails to improve as anticipated.   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  The patient presented with a recent history of hospital admission due to a leg infection and COVID-19. 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 The patient reported feeling confined at home, unable to ambulate or perform daily activities independently. This situation has significantly exacerbated the patient's pre-existing depressive symptoms, leading to feelings of overwhelming despair and frequent death wish, although the patient denied any active plans for self-harm.  The patient also reported persistent insomnia, stating he had not slept for four consecutive days despite having a prescription for Ambien. The patient admitted to occasionally taking more than the prescribed dose of Ambien in an attempt to induce sleep. The patient also reported excessive sweating, a symptom that remained unexplained despite multiple medical consultations and discontinuation of suspected medications.  She does not know if the current nortriptyline which was initiated for sweating as beneficial.  The patient had completed a course of transcranial magnetic stimulation for depression before the recent hospital admission and was on a regimen of Wellbutrin 300mg  daily. The patient also reported a history of taking Clonazepam for anxiety but had stopped on the advice of a previous doctor due to concerns about potential addictive nature.  Patient reports feeling anxious throughout the day as well as depressed due to his situational stressors.  He is interested in medication management for the same.  Patient used to be on olanzapine previously which worked well for his mood symptoms and it was discontinued due to weight gain side effects.  Patient agreeable to going back on the olanzapine.  Patient also agrees to referral to partial hospitalization program.  The patient's wound care was being managed at a wound center with weekly visits. The patient reported that the wound was healing, albeit slowly,  similar to his previous experience. The patient expressed frustration and despair over the prolonged healing process and  the prospect of needing a new prosthetic leg.  Patient at this time denies any active suicidality.  Denies any homicidality or perceptual disturbances.  Collateral information was obtained from mother Ms. Dorrene German at [phone]   Discussed with mother regarding patient's worsening mood symptoms, recurrent death wish although currently denies any active suicidality, as well as patient's inability to manage his medications with occasional overuse of Ambien.  Mother agrees to monitor patient and to assist with medication management on a day-to-day basis.  Mother also agrees to get patient to the nearest emergency department or call [phone] if in a crisis.    Visit Diagnosis:    ICD-10-CM   1. Severe episode of recurrent major depressive disorder, without psychotic features (HCC)  F33.2 OLANZapine (ZYPREXA) 5 MG tablet    2. GAD (generalized anxiety disorder)  F41.1     3. Primary insomnia  F51.01     4. Bereavement  Z63.4       Past Psychiatric History: I have reviewed past psychiatric history from progress note on 01/15/2019.  Past trials of fluoxetine, Rexulti, paroxetine, Celexa, Lexapro, Wellbutrin, Zyprexa, Seroquel, Depakote, Xanax, Abilify.  Completed TMS-2021, 04/05/2020, October 2024.  History of ECT-did not tolerate it.  Multiple inpatient behavioral health admissions in 2020.  Possible remote history of suicide attempt in 2014 although patient reports it was accidental and he did not intend to kill himself.    Past Medical History:  Past Medical History:  Diagnosis Date   Depression    Diabetes (HCC)    Insulin Pump   Diabetes mellitus type I (HCC)    Diabetes mellitus without complication (HCC)    GERD (gastroesophageal reflux disease)    H/O laryngectomy    Heel bone fracture    History of embolic stroke    Hyperlipidemia    Hypertension    Radicular pain of right lower extremity    Stroke (HCC)    Suicide attempt (HCC) 2014   damaged larynx - tracheostomy    Thyroid disease     Past Surgical History:  Procedure Laterality Date   COLONOSCOPY WITH PROPOFOL N/A 05/15/2018   Procedure: COLONOSCOPY WITH PROPOFOL;  Surgeon: [name], Boykin Nearing, MD;  Location: [facility] ENDOSCOPY;  Service: Gastroenterology;  Laterality: N/A;   ESOPHAGOGASTRODUODENOSCOPY N/A 05/15/2018   Procedure: ESOPHAGOGASTRODUODENOSCOPY (EGD);  Surgeon: [name], Boykin Nearing, MD;  Location: [facility] ENDOSCOPY;  Service: Gastroenterology;  Laterality: N/A;   FRACTURE SURGERY     Heel bone reconstruction Left    HERNIA REPAIR  02/2011   Umbilical hernia repair    LARYNGECTOMY     LOWER EXTREMITY ANGIOGRAPHY Left 01/31/2021   Procedure: LOWER EXTREMITY ANGIOGRAPHY;  Surgeon: Annice Needy, MD;  Location: [facility] INVASIVE CV LAB;  Service: Cardiovascular;  Laterality: Left;   LOWER EXTREMITY ANGIOGRAPHY Right 02/14/2021   Procedure: LOWER EXTREMITY ANGIOGRAPHY;  Surgeon: Annice Needy, MD;  Location: [facility] INVASIVE CV LAB;  Service: Cardiovascular;  Laterality: Right;   NECK SURGERY     fusion   SPINE SURGERY     TRACHEOSTOMY  2014   from SI attempt    Family Psychiatric History: I have reviewed family psychiatric history from progress note on 01/15/2019.  Family History:  Family History  Problem Relation Age of Onset   Osteoporosis Mother    Diabetes Mother    Hypertension Father    Mental illness Neg Hx  Social History: I have reviewed social history from progress note on 01/15/2019. Social History   Socioeconomic History   Marital status: Widowed    Spouse name: Toniann Fail   Number of children: Not on file   Years of education: Not on file   Highest education level: Not on file  Occupational History   Not on file  Tobacco Use   Smoking status: Former    Current packs/day: 0.00    Types: Cigarettes    Quit date: 04/09/2013    Years since quitting: 10.1   Smokeless tobacco: Never  Vaping Use   Vaping status: Never Used  Substance and Sexual Activity   Alcohol use: Yes    Alcohol/week:  2.0 standard drinks of alcohol    Types: 2 Shots of liquor per week   Drug use: No    Comment: Pt denied; UDS not available   Sexual activity: Yes    Partners: Female    Birth control/protection: Condom  Other Topics Concern   Not on file  Social History Narrative   Not on file   Social Drivers of Health   Financial Resource Strain: Medium Risk (09/12/2022)   [facility]   Overall Financial Resource Strain (CARDIA)    Difficulty of Paying Living Expenses: Somewhat hard  Food Insecurity: No Food Insecurity (04/10/2023)   Received from [facility]   Hunger Vital Sign    Worried About Running Out of Food in the Last Year: Never true    Ran Out of Food in the Last Year: Never true  Transportation Needs: No Transportation Needs (04/10/2023)   Received from [facility]    Lack of Transportation (Medical): No    Lack of Transportation (Non-Medical): No  Physical Activity: Not on file  Stress: Not on file  Social Connections: Not on file    Allergies:  Allergies  Allergen Reactions   Buspar [Buspirone]     Makes the patient "flip out"   Depakote [Valproic Acid]     Causes excessive drowsiness   Pregabalin Other (See Comments)    Gum Bleeding   Clopidogrel Rash   Gabapentin Itching and Rash    Metabolic Disorder Labs: Lab Results  Component Value Date   HGBA1C 7.4 (H) 10/28/2022   MPG 165.68 10/28/2022   MPG 177 02/14/2021   No results found for: "PROLACTIN" Lab Results  Component Value Date   CHOL 161 09/15/2016   TRIG 190 (H) 09/15/2016   HDL 41 09/15/2016   CHOLHDL 3.9 09/15/2016   VLDL 38 09/15/2016   LDLCALC 82 09/15/2016   Lab Results  Component Value Date   TSH 1.952 09/15/2016    Therapeutic Level Labs: No results found for: "LITHIUM" Lab Results  Component Value Date   VALPROATE 19 (L) 09/12/2016   No results found for: "CBMZ"  Current  Medications: Current Outpatient Medications  Medication Sig Dispense Refill   albuterol (VENTOLIN HFA) 108 (90 Base) MCG/ACT inhaler Inhale 2 puffs into the lungs every 6 (six) hours as needed for wheezing or shortness of breath. 8 g 2   amLODipine (NORVASC) 5 MG tablet Take 1 tablet (5 mg total) by mouth daily. 30 tablet 1   apixaban (ELIQUIS) 5 MG TABS tablet Take 1 tablet (5 mg total) by mouth 2 (two) times daily. 60 tablet 5   buPROPion (WELLBUTRIN XL) 300 MG 24 hr tablet TAKE 1 TABLET(300 MG) BY MOUTH DAILY WITH  BREAKFAST 90 tablet 0   Continuous Glucose Sensor (DEXCOM G7 SENSOR) MISC Use 1 each every 10 (ten) days     fluticasone furoate-vilanterol (BREO ELLIPTA) 200-25 MCG/ACT AEPB Inhale 1 puff into the lungs daily. 1 each 0   HUMALOG 100 UNIT/ML injection SMARTSIG:0-120 Unit(s) SUB-Q Daily     insulin aspart (NOVOLOG) 100 UNIT/ML injection INJECT UP TO 120 UNITS UNDER THE SKIN AS DIRECTED DAILY VIA INSULIN PUMP     levothyroxine (SYNTHROID) 75 MCG tablet TAKE 1 TABLET BY MOUTH DAILY AT 6 AM 90 tablet 0   metoprolol succinate (TOPROL-XL) 25 MG 24 hr tablet TAKE 1 TABLET(25 MG) BY MOUTH EVERY DAY     mometasone (ELOCON) 0.1 % cream Apply to rash on arms and chest twice daily until improved. 45 g 1   OLANZapine (ZYPREXA) 5 MG tablet Take 1 tablet (5 mg total) by mouth at bedtime. 30 tablet 1   ondansetron (ZOFRAN-ODT) 4 MG disintegrating tablet Take 4 mg by mouth every 8 (eight) hours as needed for nausea or vomiting.     pantoprazole (PROTONIX) 40 MG tablet TAKE 1 TABLET(40 MG) BY MOUTH TWICE DAILY     rosuvastatin (CRESTOR) 20 MG tablet Take 20 mg by mouth at bedtime.     sodium hypochlorite (DAKIN'S 1/4 STRENGTH) 0.125 % SOLN      telmisartan (MICARDIS) 80 MG tablet Take 80 mg by mouth daily.     zolpidem (AMBIEN) 10 MG tablet TAKE 1 TABLET(10 MG) BY MOUTH AT BEDTIME AS NEEDED FOR SLEEP 30 tablet 2   clonazePAM (KLONOPIN) 1 MG tablet TAKE 1 TABLET (1 MG TOTAL) BY MOUTH DAILY AS NEEDED  FOR ANXIETY. PLEASE LIMIT USE (Patient not taking: Reported on 05/30/2023) 30 tablet 2   empagliflozin (JARDIANCE) 25 MG TABS tablet Take 25 mg by mouth daily. (Patient not taking: Reported on 05/30/2023) 30 tablet 1   No current facility-administered medications for this visit.     Musculoskeletal: Strength & Muscle Tone:  UTA Gait & Station:  Seated Patient leans: N/A  Psychiatric Specialty Exam: Review of Systems  Psychiatric/Behavioral:  Positive for dysphoric mood and sleep disturbance. The patient is nervous/anxious.     There were no vitals taken for this visit.There is no height or weight on file to calculate BMI.  General Appearance: Casual  Eye Contact:  Fair  Speech:  Normal Rate  Volume:   Baseline, s/p trach  Mood:  Anxious and Depressed  Affect:  Appropriate  Thought Process:  Goal Directed and Descriptions of Associations: Intact  Orientation:  Full (Time, Place, and Person)  Thought Content: Logical   Suicidal Thoughts:   recurrent death wish , although denies active SI or plan  Homicidal Thoughts:  No  Memory:  Immediate;   Fair Recent;   Fair Remote;   Fair  Judgement:  Fair  Insight:  Fair  Psychomotor Activity:  Normal  Concentration:  Concentration: Fair and Attention Span: Fair  Recall:  [employer] of Knowledge: Fair  Language: Fair  Akathisia:  No  Handed:  Right  AIMS (if indicated): not done  Assets:  Desire for Improvement Housing Social Support Transportation  ADL's:  Intact  Cognition: WNL  Sleep:  Poor   Screenings: Geneticist, molecular Office Visit from 12/28/2022 in [facility] Office Visit from 04/05/2022 in [facility] Video Visit from 10/31/2021 in [facility] Office Visit from 08[facility]30/2021 in [facility] Office Visit from 07/05/2021 in [facility]  AIMS Total Score 0 0 0 0 0      AUDIT    Flowsheet Row Admission [facility]Discharged) [facility] Admission (Discharged) from 12/31/2018 in [facility] IN[facility] BEHAVIORAL MEDICINE Admission (Discharged) from 09/14/2016 in [facility] INPATIENT BEHAVIORAL MEDICINE  Alcohol Use Disorder Identification Test Final Score (AUDIT) 0 0 1      ECT-MADRS    Flowsheet Row Admission (Discharged) from 01/22/2019 in [facility]PATIENT BEH[facility]AL MEDICINE  MADRS Total Score 27      GAD-7    Flowsheet Row Office Visit from 03/20/2023 in [facility] Video Visit from 08/01/2022 in [facility] Office Visit from 06/06/2022 in [facility] Office Visit from 04/05/2022 in [facility] Video Visit from 01/16/2022 in [facility]  Total GAD-7 Score 8 6 3 3  0      Mini-Mental    Flowsheet Row Admission (Discharged) from 01/22/2019 in [facility] INPATIENT BEHAVIORAL MEDICINE  Total Score (max 30 points ) 30      PHQ2-9    Flowsheet Row Office Visit from 03/20/2023 in [facility] Office Visit from 12/28/2022 in [facility] Video Visit from 09/28/2022 in [facility] Video Visit from 08/01/2022 in [facility] Office Visit from 06/06/2022 in [facility]  PHQ-2 Total Score 2 6 3  0 2  PHQ-9 Total Score 7 22 13  0 6      Flowsheet Row Video Visit from 05/30/2023 in [facility] ED from 05/21/2023 in [facility] Visit from 03/20/2023 in [facility]  C-SSRS RISK CATEGORY  Moderate Risk No Risk Moderate Risk        Assessment and Plan: DEMEKO TRABUE is a 63 year old male with multiple medical problems including recent complications with his prosthesis of left leg which got infected requiring medical service admission, presents with worsening mood symptoms, recurrent death wish although currently denies any active suicidality or plan, discussed assessment and plan as noted below.  Depression-unstable Worsening depression due to recent medical setbacks, including a leg infection and subsequent surgery leading to the loss of his prosthetic leg. Completed transcranial magnetic stimulation in the past. Experiencing significant situational depression with passive death wish. Not sleeping well and has not slept in four days. Experiencing excessive sweating, previously thought to be related to venlafaxine, but persists despite discontinuation. Discussed potential benefits and risks of olanzapine for mood, sleep, and anxiety. Discussed PHP as an alternative to inpatient admission, which can be done from home via video. - Enroll in partial hospitalization program (PHP), I have referred for the same to [facility], [place]. - Discontinue nortriptyline for lack of benefit. - Prescribe olanzapine 5 mg at bedtime ( EKG reviewed 05/28/23- NSR, QTC 437) - Continue Wellbutrin XL 300 mg daily. - Discuss medication management with patient's mother - Schedule follow-up appointment in two weeks - Refer to individual therapist for one-on-one sessions after PHP  Insomnia-unstable Severe insomnia, not having slept in four days. Taking up to 20 mg of Ambien at night, which exceeds the prescribed dosage. Discussed proper use of Ambien and risks of overuse, including potential for dependency and overdose. - Prescribe olanzapine  5 mg at bedtime to aid with sleep - Discuss proper use of Ambien and risks of overuse with patient - Continue Ambien 10 mg at bedtime as needed. - Discuss  medication management with patient's mother, mother agrees to help manage his medication on a regular basis.  Anxiety-unstable Significant anxiety and agitation, exacerbated by current medical and situational stressors. Previously taken clonazepam for anxiety but discontinued due to concerns about addiction. Discussed potential benefits and risks of restarting clonazepam, including the risk of dependency. - Discuss potential use of clonazepam with patient - Restart clonazepam 1 mg daily as needed.  Patient to limit use.  Provided education regarding habit-forming potential.Reviewed [place] PMP AWARxE - Advise patient to contact pharmacy for clonazepam refill - Monitor anxiety symptoms and adjust treatment as needed  Obtained verbal consent from patient, contacted mother Ms. Dorrene German, obtained collateral information as well as discussed concerns regarding medication overuse as well as a referral to PHP.  Mother agrees to offer support and monitor patient as well as medications.    Follow-up - Schedule follow-up appointment on February 3rd at 11:30 AM - Ensure patient understands the importance of attending the follow-up appointment - Monitor patient's progress in PHP and adjust treatment plan as needed.   Collaboration of Care: Collaboration of Care: Other I have referred this patient for partial hospitalization program.  Patient/Guardian was advised Release of Information must be obtained prior to any record release in order to collaborate their care with an outside provider. Patient/Guardian was advised if they have not already done so to contact the registration department to sign all necessary forms in order for [place] to release information regarding their care.   Consent: Patient/Guardian gives verbal consent for treatment and assignment of benefits for services provided during this visit. Patient/Guardian expressed understanding and agreed to proceed.   I have spent atleast 40 minutes face  to face with patient today which includes the time spent for preparing to see the patient ( e.g., review of test, records ), obtaining and to review and separately obtained history , ordering medications  ,psychoeducation and supportive psychotherapy and care coordination,as well as documenting clinical information in electronic health record.   This note was generated in part or whole with voice recognition software. Voice recognition is usually quite accurate but there are transcription errors that can and very often do occur. I apologize for any typographical errors that were not detected and corrected.    Joel Longs, MD 05/31/2023, 7:36 PM

## 2023-06-04 ENCOUNTER — Ambulatory Visit: Admission: RE | Admit: 2023-06-04 | Payer: HMO | Source: Home / Self Care | Admitting: Gastroenterology

## 2023-06-04 HISTORY — DX: Personal history of transient ischemic attack (TIA), and cerebral infarction without residual deficits: Z86.73

## 2023-06-04 SURGERY — COLONOSCOPY WITH PROPOFOL
Anesthesia: General

## 2023-06-05 ENCOUNTER — Ambulatory Visit: Payer: Self-pay | Admitting: Physician Assistant

## 2023-06-06 ENCOUNTER — Ambulatory Visit: Payer: PPO | Admitting: Psychiatry

## 2023-06-06 ENCOUNTER — Encounter: Payer: HMO | Admitting: Physician Assistant

## 2023-06-06 DIAGNOSIS — T81328A Disruption or dehiscence of closure of other specified internal operation (surgical) wound, initial encounter: Secondary | ICD-10-CM | POA: Diagnosis not present

## 2023-06-06 DIAGNOSIS — T8131XA Disruption of external operation (surgical) wound, not elsewhere classified, initial encounter: Secondary | ICD-10-CM | POA: Diagnosis not present

## 2023-06-09 DIAGNOSIS — J9620 Acute and chronic respiratory failure, unspecified whether with hypoxia or hypercapnia: Secondary | ICD-10-CM | POA: Diagnosis not present

## 2023-06-09 DIAGNOSIS — Z930 Tracheostomy status: Secondary | ICD-10-CM | POA: Diagnosis not present

## 2023-06-09 DIAGNOSIS — R220 Localized swelling, mass and lump, head: Secondary | ICD-10-CM | POA: Diagnosis not present

## 2023-06-09 DIAGNOSIS — I639 Cerebral infarction, unspecified: Secondary | ICD-10-CM | POA: Diagnosis not present

## 2023-06-09 DIAGNOSIS — Z9002 Acquired absence of larynx: Secondary | ICD-10-CM | POA: Diagnosis not present

## 2023-06-09 DIAGNOSIS — Z89512 Acquired absence of left leg below knee: Secondary | ICD-10-CM | POA: Diagnosis not present

## 2023-06-11 ENCOUNTER — Ambulatory Visit (INDEPENDENT_AMBULATORY_CARE_PROVIDER_SITE_OTHER): Payer: PPO | Admitting: Psychiatry

## 2023-06-11 ENCOUNTER — Encounter: Payer: Self-pay | Admitting: Psychiatry

## 2023-06-11 VITALS — BP 132/78 | HR 78 | Temp 98.4°F

## 2023-06-11 DIAGNOSIS — F332 Major depressive disorder, recurrent severe without psychotic features: Secondary | ICD-10-CM | POA: Diagnosis not present

## 2023-06-11 DIAGNOSIS — F411 Generalized anxiety disorder: Secondary | ICD-10-CM

## 2023-06-11 DIAGNOSIS — Z430 Encounter for attention to tracheostomy: Secondary | ICD-10-CM | POA: Diagnosis not present

## 2023-06-11 DIAGNOSIS — F5101 Primary insomnia: Secondary | ICD-10-CM

## 2023-06-11 DIAGNOSIS — Z634 Disappearance and death of family member: Secondary | ICD-10-CM | POA: Diagnosis not present

## 2023-06-11 MED ORDER — BUPROPION HCL ER (XL) 300 MG PO TB24: ORAL_TABLET | ORAL | 0 refills | Status: DC

## 2023-06-11 NOTE — Progress Notes (Unsigned)
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  Patient today reports he feels better with the addition of olanzapine.  Since starting the olanzapine he has started sleeping better.  He denies any side effects to the olanzapine other than increased appetite.  He has been watching his diet.  He is diabetic and hence is aware he needs to watch his diet.  Since being discharged from the hospital his blood sugar control has been better especially now that he is back on his insulin pump.  Patient reports due to his current stressors he often has thoughts of ' it would have been better if I were not here', passive suicidality.  He however does not want to act on these thoughts, denies having any plans.  Reports he has had suicide attempt in the past and he regrets that to this day for doing something like that.  Patient also has a remote history of taking more medications, opioids several years ago to get high which resulted in opioid overdose, accidental.  Patient reports he does not want to do anything like that anymore and cause more stress to himself and his family.  Patient currently denies any psychosis, homicidality.  Patient is interested in establishing care with a therapist.  He was referred to  PHP/IOP however due to his current and medical problems he is unable to sit straight for several hours and participate in those kind of programs.  Collateral information obtained from mother who was present in session today.  Mother reports she and her spouse has been trying to support him the best way they can, checks in on him on a daily basis.  Mother agrees that he feels stuck at home since he does not have a lot of outlets.  She is willing to take him out for walks when weather allows.  She has been managing his medications on a regular basis.  She also agrees with the fact that he may benefit from one-on-one therapy.    Visit Diagnosis:    ICD-10-CM   1. Severe episode of recurrent major depressive disorder, without psychotic features (HCC)  F33.2     2. GAD (generalized anxiety disorder)  F41.1     3. Primary insomnia  F51.01     4. Bereavement  Z63.4 buPROPion (WELLBUTRIN XL) 300 MG 24 hr tablet      Past Psychiatric History: I have reviewed past psychiatric history from progress note on 01/15/2019.  Past trials of fluoxetine, Rexulti, Paxil, Celexa, Lexapro, Wellbutrin, Zyprexa, Seroquel, Depakote, Xanax, Abilify.  Completed TMS-2021, 04/05/2020, October 2024.  History of ECT-did not tolerate it.  Multiple inpatient behavioral health admissions in the past-2020.  Reports possible remote suicide attempts several years ago when he tried to overdose on bug spray as well as had plan to jump in front of train although did not go through with  it.  History of overdose on opioids in 2014 however reports it was not a suicide attempt and that he was trying to get high and it was accidental.  Past Medical History:  Past Medical History:  Diagnosis Date   Depression    Diabetes (HCC)    Insulin Pump   Diabetes mellitus type I (HCC)    Diabetes mellitus without complication (HCC)    GERD (gastroesophageal reflux disease)    H/O laryngectomy    Heel bone fracture    History of embolic stroke     Hyperlipidemia    Hypertension    Radicular pain of right lower extremity    Stroke (HCC)    Suicide attempt (HCC) 2014   damaged larynx - tracheostomy   Thyroid disease     Past Surgical History:  Procedure Laterality Date   COLONOSCOPY WITH PROPOFOL N/A 05/15/2018   Procedure: COLONOSCOPY WITH PROPOFOL;  Surgeon: [name], Boykin Nearing, MD;  Location: [facility] ENDOSCOPY;  Service: Gastroenterology;  Laterality: N/A;   ESOPHAGOGASTRODUODENOSCOPY N/A 05/15/2018   Procedure: ESOPHAGOGASTRODUODENOSCOPY (EGD);  Surgeon: [name], Boykin Nearing, MD;  Location: [facility] ENDOSCOPY;  Service: Gastroenterology;  Laterality: N/A;   FRACTURE SURGERY     Heel bone reconstruction Left    HERNIA REPAIR  02/2011   Umbilical hernia repair    LARYNGECTOMY     LOWER EXTREMITY ANGIOGRAPHY Left 01/31/2021   Procedure: LOWER EXTREMITY ANGIOGRAPHY;  Surgeon: Annice Needy, MD;  Location: [facility] INVASIVE CV LAB;  Service: Cardiovascular;  Laterality: Left;   LOWER EXTREMITY ANGIOGRAPHY Right 02/14/2021   Procedure: LOWER EXTREMITY ANGIOGRAPHY;  Surgeon: Annice Needy, MD;  Location: [facility] INVASIVE CV LAB;  Service: Cardiovascular;  Laterality: Right;   NECK SURGERY     fusion   SPINE SURGERY     TRACHEOSTOMY  2014   from SI attempt    Family Psychiatric History: I have reviewed family psychiatric history from progress note on 01/15/2019.  Family History:  Family History  Problem Relation Age of Onset   Osteoporosis Mother    Diabetes Mother    Hypertension Father    Mental illness Neg Hx     Social History: I have reviewed social history from progress note on 01/15/2019. Social History   Socioeconomic History   Marital status: Widowed    Spouse name: Toniann Fail   Number of children: Not on file   Years of education: Not on file   Highest education level: Not on file  Occupational History   Not on file  Tobacco Use   Smoking status: Former    Current packs/day: 0.00    Types: Cigarettes    Quit date: 04/09/2013     Years since quitting: 10.1   Smokeless tobacco: Never  Vaping Use   Vaping status: Never Used  Substance and Sexual Activity   Alcohol use: Yes    Alcohol/week: 2.0 standard drinks of alcohol    Types: 2 Shots of liquor per week   Drug use: No    Comment: Pt denied; UDS not available   Sexual activity: Yes    Partners: Female    Birth control/protection: Condom  Other Topics Concern   Not on file  Social History Narrative   Not on file   Social Drivers of Health   Financial Resource Strain: Medium Risk (09/12/2022)   Received from [facility]   Overall Financial Resource Strain (CARDIA)    Difficulty of Paying Living Expenses: Somewhat hard  Food  Insecurity: No Food Insecurity (04/10/2023)   Received from [facility]   Hunger Vital Sign    Worried About Running Out of Food in the Last Year: Never true    Ran Out of Food in the Last Year: Never true  Transportation Needs: No Transportation Needs (04/10/2023)   Received from [facility]    Lack of Transportation (Medical): No    Lack of Transportation (Non-Medical): No  Physical Activity: Not on file  Stress: Not on file  Social Connections: Not on file    Allergies:  Allergies  Allergen Reactions   Buspar [Buspirone]     Makes the patient "flip out"   Depakote [Valproic Acid]     Causes excessive drowsiness   Pregabalin Other (See Comments)    Gum Bleeding   Clopidogrel Rash   Gabapentin Itching and Rash    Metabolic Disorder Labs: Lab Results  Component Value Date   HGBA1C 7.4 (H) 10/28/2022   MPG 165.68 10/28/2022   MPG 177 02/14/2021   No results found for: "PROLACTIN" Lab Results  Component Value Date   CHOL 161 09/15/2016   TRIG 190 (H) 09/15/2016   HDL 41 09/15/2016   CHOLHDL 3.9 09/15/2016   VLDL 38 09/15/2016   LDLCALC 82 09/15/2016   Lab Results  Component Value Date   TSH 1.952 09/15/2016    Therapeutic  Level Labs: No results found for: "LITHIUM" Lab Results  Component Value Date   VALPROATE 19 (L) 09/12/2016   No results found for: "CBMZ"  Current Medications: Current Outpatient Medications  Medication Sig Dispense Refill   albuterol (VENTOLIN HFA) 108 (90 Base) MCG/ACT inhaler Inhale 2 puffs into the lungs every 6 (six) hours as needed for wheezing or shortness of breath. 8 g 2   amLODipine (NORVASC) 5 MG tablet Take 1 tablet (5 mg total) by mouth daily. 30 tablet 1   apixaban (ELIQUIS) 5 MG TABS tablet Take 1 tablet (5 mg total) by mouth 2 (two) times daily. 60 tablet 5   clonazePAM (KLONOPIN) 1 MG tablet TAKE 1 TABLET (1 MG TOTAL) BY MOUTH DAILY AS NEEDED FOR ANXIETY. PLEASE LIMIT USE 30 tablet 2   Continuous Glucose Sensor (DEXCOM G7 SENSOR) MISC Use 1 each every 10 (ten) days     empagliflozin (JARDIANCE) 25 MG TABS tablet Take 25 mg by mouth daily. 30 tablet 1   fluticasone furoate-vilanterol (BREO ELLIPTA) 200-25 MCG/ACT AEPB Inhale 1 puff into the lungs daily. 1 each 0   HUMALOG 100 UNIT/ML injection SMARTSIG:0-120 Unit(s) SUB-Q Daily     insulin aspart (NOVOLOG) 100 UNIT/ML injection INJECT UP TO 120 UNITS UNDER THE SKIN AS DIRECTED DAILY VIA INSULIN PUMP     levothyroxine (SYNTHROID) 75 MCG tablet TAKE 1 TABLET BY MOUTH DAILY AT 6 AM 90 tablet 0   metoprolol succinate (TOPROL-XL) 25 MG 24 hr tablet TAKE 1 TABLET(25 MG) BY MOUTH EVERY DAY     mometasone (ELOCON) 0.1 % cream Apply to rash on arms and chest twice daily until improved. 45 g 1   OLANZapine (ZYPREXA) 5 MG tablet Take 1 tablet (5 mg total) by mouth at bedtime. 30 tablet 1   ondansetron (ZOFRAN-ODT) 4 MG disintegrating tablet Take 4 mg by mouth every 8 (eight) hours as needed for nausea or vomiting.     pantoprazole (PROTONIX) 40 MG tablet TAKE 1 TABLET(40 MG) BY MOUTH TWICE DAILY     rosuvastatin (CRESTOR) 20 MG tablet Take 20 mg  by mouth at bedtime.     sodium hypochlorite (DAKIN'S 1/4 STRENGTH) 0.125 % SOLN       telmisartan (MICARDIS) 80 MG tablet Take 80 mg by mouth daily.     zolpidem (AMBIEN) 10 MG tablet TAKE 1 TABLET(10 MG) BY MOUTH AT BEDTIME AS NEEDED FOR SLEEP 30 tablet 2   buPROPion (WELLBUTRIN XL) 300 MG 24 hr tablet TAKE 1 TABLET(300 MG) BY MOUTH DAILY WITH BREAKFAST 90 tablet 0   No current facility-administered medications for this visit.     Musculoskeletal: Strength & Muscle Tone:  WNL , BKA left side Gait & Station:  In a wheelchair Patient leans: N/A  Psychiatric Specialty Exam: Review of Systems  Psychiatric/Behavioral:  Positive for dysphoric mood.     Blood pressure 132/78, pulse 78, temperature 98.4 F (36.9 C), temperature source Temporal, SpO2 100%.There is no height or weight on file to calculate BMI.  General Appearance: Casual  Eye Contact:  Fair  Speech:  Normal Rate  Volume:   Baseline status post trach  Mood:  Depressed  Affect:  Appropriate  Thought Process:  Goal Directed and Descriptions of Associations: Intact  Orientation:  Full (Time, Place, and Person)  Thought Content: Logical   Suicidal Thoughts:   Currently denies any suicidality although reports due to medical problems has a recurrent death wish although denies any active intent or plan  Homicidal Thoughts:  No  Memory:  Immediate;   Fair Recent;   Fair Remote;   Fair  Judgement:  Fair  Insight:  Fair  Psychomotor Activity:  Normal  Concentration:  Concentration: Fair and Attention Span: Fair  Recall:  [employer] of Knowledge: Fair  Language: Fair  Akathisia:  No  Handed:  Right  AIMS (if indicated): done  Assets:  Desire for Improvement Housing Social Support Transportation  ADL's:  Intact  Cognition: WNL  Sleep:  Fair   Screenings: Midwife Visit from 12/28/2022 in [facility] Office Visit from 04/05/2022 in [facility] Video Visit from 10/31/2021 in [facility] Office Visit from 08/30/2021 in [facility] Office Visit from 07/05/2021 in [facility]  AIMS Total Score 0 0 0 0 0      AUDIT    Flowsheet Row Admission (Discharged) from 01/22/2019 in [facility] INPATIENT BEHAVIORAL MEDICINE Admission (Discharged) from 12/31/2018 in [facility] BEHAVIORAL MEDICINE Admission (Discharged) from 09/14/2016 in [facility] INPATIENT BEHAVIORAL MEDICINE  Alcohol Use Disorder Identification Test Final Score (AUDIT) 0 0 1      ECT-MADRS    Flowsheet Row Admission (Discharged) from 01/22/2019 in [facility] INPATIENT BEHAV[facility]AL MEDICINE  MADRS Total Score 27      GAD-7    Flowsheet Row Office Visit from 03/20/2023 in [facility] Video Visit from 08/01/2022 in [facility] Office Visit from 06/06/2022 in [facility] Office Visit from 04/05/2022 in [facility] Video Visit from 01/16/2022 in [facility]  Total GAD-7 Score 8 6 3 3  0      Mini-Mental    Flowsheet Row Admission (Discharged) from 01/22/2019 in [facility] INPATIENT BEHAVIORAL MEDICINE  Total Score (max 30 points ) 30      PHQ2-9    Flowsheet Row Office Visit from 03/20/2023 in [facility] Office  Visit from 12/28/2022 in [facility] Video Visit from 09/28/2022 in [facility] Video Visit from 08/01/2022 in [facility] Office Visit from 06/06/2022 in [facility]  PHQ-2 Total Score 2 6 3  0 2  PHQ-9 Total Score 7 22 13  0 6      Flowsheet Row Office Visit from 06/11/2023 in [facility] Video  Visit from 05/30/2023 in [facility] ED from 05/21/2023 in [facility] Emergency Department at [facility]  C-SSRS RISK CATEGORY Moderate Risk Moderate Risk No Risk        Assessment and Plan: TAVEN STRITE is a 63 year old male with multiple medical problems including recent complications with his prosthetic left leg status post infection requiring medical service admission currently under the care of wound clinic,  with worsening mood symptoms recently although improving on the current medication regimen, discussed assessment and plan as noted below.  Depression-improving Reports improvement of depression symptoms with the addition of olanzapine.  Especially sleep has improved which has helped. Patient also was referred to partial hospitalization program did not participate in the program since due to medical issues was worried about setting straight for several hours every day to attend the program.  Patient is currently interested in referral to individual psychotherapy and to continue medication management to address his symptoms.  Mother who is present in session agrees with plan. - Continue Olanzapine 5 mg at bedtime.( EKG -reviewed 05/28/2023-NSR,[id number]) - Continue Wellbutrin XL 300 mg daily  Insomnia-improving Currently reports sleep as improving on the current addition of olanzapine. -Continue Olanzapine 5 mg at bedtime. -Continue Ambien 10 mg at bedtime as needed. -Patient provided information about combining multiple sedating medications.  Discussed risk, benefits.  Generalized anxiety disorder-improving Patient reports anxiety as improving on the current combination of medication.  Agreeable to establish care with therapist. - Refer for CBT.  I have communicated with staff to schedule this patient with in-house therapist. - Continue clonazepam 1 mg daily as needed. - Reviewed [place] PMP AWARxE    Collateral information was obtained  from mother Ms. Dorrene German who was present in session.  Mother agrees with plan that patient will benefit from individual therapy rather than PHP due to current comorbid medical issues and pain.   Follow-up in clinic in 3 to 4 weeks or sooner if needed.  Collaboration of Care: Collaboration of Care: Referral or follow-up with counselor/therapist AEB patient encouraged to establish care with therapist.  Patient/Guardian was advised Release of Information must be obtained prior to any record release in order to collaborate their care with an outside provider. Patient/Guardian was advised if they have not already done so to contact the registration department to sign all necessary forms in order for [place] to release information regarding their care.   Consent: Patient/Guardian gives verbal consent for treatment and assignment of benefits for services provided during this visit. Patient/Guardian expressed understanding and agreed to proceed.   This note was generated in part or whole with voice recognition software. Voice recognition is usually quite accurate but there are transcription errors that can and very often do occur. I apologize for any typographical errors that were not detected and corrected.    Jomarie Longs, MD 06/11/2023, 12:58 PM

## 2023-06-11 NOTE — Patient Instructions (Signed)
Insight Timer  - app store

## 2023-06-13 ENCOUNTER — Encounter: Payer: HMO | Attending: Physician Assistant | Admitting: Physician Assistant

## 2023-06-13 DIAGNOSIS — Z794 Long term (current) use of insulin: Secondary | ICD-10-CM | POA: Insufficient documentation

## 2023-06-13 DIAGNOSIS — Z89512 Acquired absence of left leg below knee: Secondary | ICD-10-CM | POA: Insufficient documentation

## 2023-06-13 DIAGNOSIS — T8131XA Disruption of external operation (surgical) wound, not elsewhere classified, initial encounter: Secondary | ICD-10-CM | POA: Insufficient documentation

## 2023-06-13 DIAGNOSIS — X58XXXA Exposure to other specified factors, initial encounter: Secondary | ICD-10-CM | POA: Diagnosis not present

## 2023-06-13 DIAGNOSIS — E10622 Type 1 diabetes mellitus with other skin ulcer: Secondary | ICD-10-CM | POA: Insufficient documentation

## 2023-06-13 DIAGNOSIS — L97822 Non-pressure chronic ulcer of other part of left lower leg with fat layer exposed: Secondary | ICD-10-CM | POA: Diagnosis not present

## 2023-06-13 DIAGNOSIS — Z7901 Long term (current) use of anticoagulants: Secondary | ICD-10-CM | POA: Diagnosis not present

## 2023-06-13 DIAGNOSIS — T81328A Disruption or dehiscence of closure of other specified internal operation (surgical) wound, initial encounter: Secondary | ICD-10-CM | POA: Diagnosis not present

## 2023-06-14 DIAGNOSIS — M5412 Radiculopathy, cervical region: Secondary | ICD-10-CM | POA: Diagnosis not present

## 2023-06-14 DIAGNOSIS — M7502 Adhesive capsulitis of left shoulder: Secondary | ICD-10-CM | POA: Diagnosis not present

## 2023-06-16 DIAGNOSIS — E1029 Type 1 diabetes mellitus with other diabetic kidney complication: Secondary | ICD-10-CM | POA: Diagnosis not present

## 2023-06-20 ENCOUNTER — Encounter: Payer: HMO | Admitting: Physician Assistant

## 2023-06-20 DIAGNOSIS — T8131XA Disruption of external operation (surgical) wound, not elsewhere classified, initial encounter: Secondary | ICD-10-CM | POA: Diagnosis not present

## 2023-06-20 DIAGNOSIS — M6281 Muscle weakness (generalized): Secondary | ICD-10-CM | POA: Diagnosis not present

## 2023-06-20 DIAGNOSIS — M5412 Radiculopathy, cervical region: Secondary | ICD-10-CM | POA: Diagnosis not present

## 2023-06-20 DIAGNOSIS — T81328A Disruption or dehiscence of closure of other specified internal operation (surgical) wound, initial encounter: Secondary | ICD-10-CM | POA: Diagnosis not present

## 2023-06-20 DIAGNOSIS — M542 Cervicalgia: Secondary | ICD-10-CM | POA: Diagnosis not present

## 2023-06-21 DIAGNOSIS — E1022 Type 1 diabetes mellitus with diabetic chronic kidney disease: Secondary | ICD-10-CM | POA: Diagnosis not present

## 2023-06-21 DIAGNOSIS — Z125 Encounter for screening for malignant neoplasm of prostate: Secondary | ICD-10-CM | POA: Diagnosis not present

## 2023-06-21 DIAGNOSIS — Z89512 Acquired absence of left leg below knee: Secondary | ICD-10-CM | POA: Diagnosis not present

## 2023-06-21 DIAGNOSIS — I10 Essential (primary) hypertension: Secondary | ICD-10-CM | POA: Diagnosis not present

## 2023-06-21 DIAGNOSIS — I70229 Atherosclerosis of native arteries of extremities with rest pain, unspecified extremity: Secondary | ICD-10-CM | POA: Diagnosis not present

## 2023-06-21 DIAGNOSIS — I480 Paroxysmal atrial fibrillation: Secondary | ICD-10-CM | POA: Diagnosis not present

## 2023-06-21 DIAGNOSIS — I152 Hypertension secondary to endocrine disorders: Secondary | ICD-10-CM | POA: Diagnosis not present

## 2023-06-21 DIAGNOSIS — N1832 Chronic kidney disease, stage 3b: Secondary | ICD-10-CM | POA: Diagnosis not present

## 2023-06-21 DIAGNOSIS — E1159 Type 2 diabetes mellitus with other circulatory complications: Secondary | ICD-10-CM | POA: Diagnosis not present

## 2023-06-21 DIAGNOSIS — Z79899 Other long term (current) drug therapy: Secondary | ICD-10-CM | POA: Diagnosis not present

## 2023-06-21 DIAGNOSIS — Z930 Tracheostomy status: Secondary | ICD-10-CM | POA: Diagnosis not present

## 2023-06-22 ENCOUNTER — Other Ambulatory Visit: Payer: Self-pay | Admitting: Psychiatry

## 2023-06-22 DIAGNOSIS — F332 Major depressive disorder, recurrent severe without psychotic features: Secondary | ICD-10-CM

## 2023-06-26 DIAGNOSIS — E785 Hyperlipidemia, unspecified: Secondary | ICD-10-CM | POA: Diagnosis not present

## 2023-06-26 DIAGNOSIS — E1029 Type 1 diabetes mellitus with other diabetic kidney complication: Secondary | ICD-10-CM | POA: Diagnosis not present

## 2023-06-26 DIAGNOSIS — E039 Hypothyroidism, unspecified: Secondary | ICD-10-CM | POA: Diagnosis not present

## 2023-06-26 DIAGNOSIS — E1069 Type 1 diabetes mellitus with other specified complication: Secondary | ICD-10-CM | POA: Diagnosis not present

## 2023-06-26 DIAGNOSIS — I152 Hypertension secondary to endocrine disorders: Secondary | ICD-10-CM | POA: Diagnosis not present

## 2023-06-26 DIAGNOSIS — E1159 Type 2 diabetes mellitus with other circulatory complications: Secondary | ICD-10-CM | POA: Diagnosis not present

## 2023-06-26 DIAGNOSIS — R809 Proteinuria, unspecified: Secondary | ICD-10-CM | POA: Diagnosis not present

## 2023-06-27 ENCOUNTER — Encounter: Payer: Self-pay | Admitting: Physician Assistant

## 2023-06-27 DIAGNOSIS — Z9002 Acquired absence of larynx: Secondary | ICD-10-CM | POA: Diagnosis not present

## 2023-06-27 DIAGNOSIS — Z89512 Acquired absence of left leg below knee: Secondary | ICD-10-CM | POA: Diagnosis not present

## 2023-06-27 DIAGNOSIS — T8131XA Disruption of external operation (surgical) wound, not elsewhere classified, initial encounter: Secondary | ICD-10-CM | POA: Diagnosis not present

## 2023-06-27 DIAGNOSIS — Z930 Tracheostomy status: Secondary | ICD-10-CM | POA: Diagnosis not present

## 2023-06-27 DIAGNOSIS — L97822 Non-pressure chronic ulcer of other part of left lower leg with fat layer exposed: Secondary | ICD-10-CM | POA: Diagnosis not present

## 2023-06-27 DIAGNOSIS — E10621 Type 1 diabetes mellitus with foot ulcer: Secondary | ICD-10-CM | POA: Diagnosis not present

## 2023-06-27 DIAGNOSIS — T8781 Dehiscence of amputation stump: Secondary | ICD-10-CM | POA: Diagnosis not present

## 2023-06-28 DIAGNOSIS — E1069 Type 1 diabetes mellitus with other specified complication: Secondary | ICD-10-CM | POA: Diagnosis not present

## 2023-06-29 DIAGNOSIS — E1029 Type 1 diabetes mellitus with other diabetic kidney complication: Secondary | ICD-10-CM | POA: Diagnosis not present

## 2023-07-02 ENCOUNTER — Other Ambulatory Visit: Payer: Self-pay | Admitting: Psychiatry

## 2023-07-02 ENCOUNTER — Ambulatory Visit: Payer: PPO | Admitting: Psychiatry

## 2023-07-02 DIAGNOSIS — F411 Generalized anxiety disorder: Secondary | ICD-10-CM

## 2023-07-03 NOTE — Telephone Encounter (Signed)
 Patient's clonazepam was approved.

## 2023-07-03 NOTE — Telephone Encounter (Signed)
 pt called states he needs a refill on the clonazepam. pt was last seen on 2-3 next appt 3-17

## 2023-07-04 ENCOUNTER — Encounter: Payer: HMO | Admitting: Physician Assistant

## 2023-07-04 DIAGNOSIS — T8131XA Disruption of external operation (surgical) wound, not elsewhere classified, initial encounter: Secondary | ICD-10-CM | POA: Diagnosis not present

## 2023-07-04 NOTE — Telephone Encounter (Signed)
left message that rx was sent to pharmacy.  

## 2023-07-09 DIAGNOSIS — M5412 Radiculopathy, cervical region: Secondary | ICD-10-CM | POA: Diagnosis not present

## 2023-07-09 DIAGNOSIS — M6281 Muscle weakness (generalized): Secondary | ICD-10-CM | POA: Diagnosis not present

## 2023-07-09 DIAGNOSIS — M542 Cervicalgia: Secondary | ICD-10-CM | POA: Diagnosis not present

## 2023-07-10 ENCOUNTER — Other Ambulatory Visit: Payer: Self-pay | Admitting: Psychiatry

## 2023-07-10 DIAGNOSIS — F3341 Major depressive disorder, recurrent, in partial remission: Secondary | ICD-10-CM

## 2023-07-11 ENCOUNTER — Encounter: Payer: Self-pay | Attending: Physician Assistant | Admitting: Physician Assistant

## 2023-07-11 DIAGNOSIS — L97822 Non-pressure chronic ulcer of other part of left lower leg with fat layer exposed: Secondary | ICD-10-CM | POA: Diagnosis not present

## 2023-07-11 DIAGNOSIS — Y835 Amputation of limb(s) as the cause of abnormal reaction of the patient, or of later complication, without mention of misadventure at the time of the procedure: Secondary | ICD-10-CM | POA: Diagnosis not present

## 2023-07-11 DIAGNOSIS — T81328A Disruption or dehiscence of closure of other specified internal operation (surgical) wound, initial encounter: Secondary | ICD-10-CM | POA: Diagnosis not present

## 2023-07-11 DIAGNOSIS — Z89512 Acquired absence of left leg below knee: Secondary | ICD-10-CM | POA: Diagnosis not present

## 2023-07-11 DIAGNOSIS — Z7901 Long term (current) use of anticoagulants: Secondary | ICD-10-CM | POA: Insufficient documentation

## 2023-07-11 DIAGNOSIS — E10622 Type 1 diabetes mellitus with other skin ulcer: Secondary | ICD-10-CM | POA: Diagnosis not present

## 2023-07-11 DIAGNOSIS — T8781 Dehiscence of amputation stump: Secondary | ICD-10-CM | POA: Insufficient documentation

## 2023-07-12 ENCOUNTER — Ambulatory Visit (INDEPENDENT_AMBULATORY_CARE_PROVIDER_SITE_OTHER): Payer: Self-pay | Admitting: Professional Counselor

## 2023-07-12 DIAGNOSIS — F332 Major depressive disorder, recurrent severe without psychotic features: Secondary | ICD-10-CM | POA: Diagnosis not present

## 2023-07-12 DIAGNOSIS — F411 Generalized anxiety disorder: Secondary | ICD-10-CM

## 2023-07-12 NOTE — Progress Notes (Signed)
 Comprehensive Clinical Assessment (CCA) Note  07/12/2023 ISAIA HASSELL [id number]  Chief Complaint:  Chief Complaint  Patient presents with   Establish Care    "I really don't know. Dr. Elna Breslow wanted me to come and that's why I'm here. 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     Visit Diagnosis:  MDD, recurrent, severe; GAD   CCA Screening, Triage and Referral (STR)  Patient Reported Information How did you hear about [place]? Other (Comment)  Referral name: Dr. Elna Breslow  Whom do you see for routine medical problems? Primary Care  Practice/Facility Name: [facility]  What Is the Reason for Your Visit/Call Today? Establish therapy  How Long Has This Been Causing You Problems? 1-6 months  What Do You Feel Would Help You the Most Today? Treatment for Depression or other mood problem  Have You Recently Been in Any Inpatient Treatment (Hospital/Detox/Crisis Center/28-Day Program)? Yes  Have You Ever Received Services From [facility] Before? Yes  Who Do You See at [facility]? Dr. Elna Breslow  Have You Recently Had Any Thoughts About Hurting Yourself? Yes  Are You Planning to Commit Suicide/Harm Yourself At This time? No  Have you Recently Had Thoughts About Hurting Someone Karolee Ohs? No  Have You Used Any Alcohol or Drugs in the Past 24 Hours? No  Do You Currently Have a Therapist/Psychiatrist? Yes  Name of Therapist/Psychiatrist: Dr. Elna Breslow  Have You Been Recently Discharged From Any Office Practice or Programs? No    CCA Screening Triage Referral Assessment Type of Contact: Face-to-Face  Is this Initial or Reassessment? Initial  Collateral Involvement: None  Does Patient Have a Automotive engineer Guardian? No  Is CPS involved or ever been involved? Never  Is APS involved or ever been involved? Never  Patient Determined To  Be At Risk for Harm To Self or Others Based on Review of Patient Reported Information or Presenting Complaint? Yes, for Self-Harm  Method: No Plan  Are There Guns or Other Weapons in Your Home? No  Do You Have any Outstanding Charges, Pending Court Dates, Parole/Probation? No  Location of Assessment: [facility]  Does Patient Present under Involuntary Commitment? No  [place] of Residence: [place]  Patient Currently Receiving the Following Services: Medication Management  Determination of Need: Routine (7 days)  Options For Referral: Outpatient Therapy   CCA Biopsychosocial Intake/Chief Complaint:  Depression  Current Symptoms/Problems: "I just don't even feel like I want to be here anymore. I'm just tired of fighting. Everybody keeps telling me I can do this, I can do that, but it's not as easy as everybody says. And I get down and all I want to do is get in the bed and go to sleep."   Patient Reported Schizophrenia/Schizoaffective Diagnosis in Past: No data recorded  Strengths: "I have no idea."  Preferences: "I prefer talking to a male and I'd rather be one-on-one talking to someone. I don't like a group. I've done that before and I feel like I just absorbed everybody else's problems."  Abilities: "Well I was good at [employer] work but I don't ever do that anymore because I can't walk."   Type of Services Patient Feels are Needed: "A miracle."   Initial Clinical Notes/Concerns: No data recorded  Mental Health Symptoms Depression:  Change in energy/activity; Fatigue; Hopelessness; Worthlessness; Sleep (too much or little); Irritability  Duration of Depressive symptoms: Greater than two weeks   Mania:  None   Anxiety:   Difficulty concentrating; Fatigue; Irritability; Worrying; Sleep   Psychosis:  None   Duration of Psychotic symptoms: No data recorded  Trauma:  Re-experience of traumatic event ("Sometimes I have nightmares about me and my wife.")   Obsessions:   None   Compulsions:  None   Inattention:  None   Hyperactivity/Impulsivity:  None   Oppositional/Defiant Behaviors:  None   Emotional Irregularity:  None   Other Mood/Personality Symptoms:  No data recorded   Mental Status Exam Appearance and self-care  Stature:  Average   Weight:  Overweight   Clothing:  Casual   Grooming:  Well-groomed   Cosmetic use:  None   Posture/gait:  Normal   Motor activity:  Not Remarkable   Sensorium  Attention:  Normal   Concentration:  Normal   Orientation:  X5   Recall/memory:  Normal   Affect and Mood  Affect:  Depressed   Mood:  Dysphoric   Relating  Eye contact:  Normal   Facial expression:  Sad   Attitude toward examiner:  Cooperative   Thought and Language  Speech flow: Clear and Coherent   Thought content:  Appropriate to Mood and Circumstances   Preoccupation:  None   Hallucinations:  None   Organization:  No data recorded  [employer] of Knowledge:  Good   Intelligence:  Average   Abstraction:  Functional   Judgement:  Fair   Dance movement psychotherapist:  Realistic   Insight:  Fair   Decision Making:  Normal   Social Functioning  Social Maturity:  Isolates   Social Judgement:  Normal   Stress  Stressors:  Grief/losses; Illness   Coping Ability:  Exhausted   Skill Deficits:  Activities of daily living   Supports:  Family (Mom and dad)       07/12/2023    1:09 PM 03/20/2023    3:42 PM 12/28/2022   11:21 AM  Depression screen PHQ 2/9  Decreased Interest 0 1 3  Down, Depressed, Hopeless 3 1 3   PHQ - 2 Score 3 2 6   Altered sleeping 3 1 3   Tired, decreased energy 0 2 3  Change in appetite 3 0 3  Feeling bad or failure about yourself  3 1 3   Trouble concentrating 1 0 3  Moving slowly or fidgety/restless 0 0 0  Suicidal thoughts 3 1 1   PHQ-9 Score 16 7 22   Difficult doing work/chores Somewhat difficult        07/12/2023    1:08 PM 03/20/2023    3:42 PM 08/01/2022    2:07 PM  06/06/2022    4:14 PM  GAD 7 : Generalized Anxiety Score  Nervous, Anxious, on Edge 3 2 0 0  Control/stop worrying 3 2 3 1   Worry too much - different things 3 2 3 1   Trouble relaxing 2 2 0 0  Restless 0 0 0 0  Easily annoyed or irritable 3 0 0 1  Afraid - awful might happen 3 0 0 0  Total GAD 7 Score 17 8 6 3   Anxiety Difficulty Extremely difficult Somewhat difficult Not difficult at all Not difficult at all   Religion: Religion/Spirituality Are You A Religious Person?: Yes What is Your Religious Affiliation?: Non-Denominational  Leisure/Recreation: Leisure / Recreation Do You Have Hobbies?: No  Exercise/Diet: Exercise/Diet Do You Exercise?: Yes How Many Times a Week Do You Exercise?: 6-7 times  a week Have You Gained or Lost A Significant Amount of Weight in the Past Six Months?: No Do You Follow a Special Diet?: No Do You Have Any Trouble Sleeping?: Yes Explanation of Sleeping Difficulties: Trouble falling and staying asleep and wanting to sleep too much sometimes   CCA Employment/Education Employment/Work Situation: Employment / Work Situation Employment Situation: On disability Why is Patient on Disability: Physical health issues How Long has Patient Been on Disability: 2015 What is the Longest Time Patient has Held a Job?: 16 years Where was the Patient Employed at that Time?: [employer] Has Patient ever Been in the [employer]?: No  Education: Education Is Patient Currently Attending School?: No Did Garment/textile technologist From [employer]?: Yes Did Theme park manager?: Yes What Type of College Degree Do you Have?: Finishes three years of college in computer information Did You Attend Graduate School?: No Did You Have An Individualized Education Program (IIEP): No Did You Have Any Difficulty At School?: No Patient's Education Has Been Impacted by Current Illness: No   CCA Family/Childhood History Family and Relationship History: Family history Marital status:  Widowed Widowed, when?: 3 years ago Are you sexually active?: No What is your sexual orientation?: Heterosexual Has your sexual activity been affected by drugs, alcohol, medication, or emotional stress?: No Does patient have children?: Yes How many children?: 4 How is patient's relationship with their children?: Two daughters and two sons, "Distant, very distant. They're so involved with working and school and their families, they don't have time for me anymore. They all live away from here."  Childhood History:  Childhood History By whom was/is the patient raised?: Both parents Additional childhood history information: Raised by both parents, "It was a good childhood." Description of patient's relationship with caregiver when they were a child: Mother - "It was good." Father - "Good." Patient's description of current relationship with people who raised him/her: "I feel like I'm closer to my mom than I am my dad." Does patient have siblings?: Yes Number of Siblings: 2 Description of patient's current relationship with siblings: 1 brother and 1 sister, "Me and my brother talk every once in a while but me and my sister hardly ever talk. She's got a business she's running, she just don't have time for anybody else." Did patient suffer any verbal/emotional/physical/sexual abuse as a child?: No Did patient suffer from severe childhood neglect?: No Has patient ever been sexually abused/assaulted/raped as an adolescent or adult?: No Was the patient ever a victim of a crime or a disaster?: No Witnessed domestic violence?: No Has patient been affected by domestic violence as an adult?: No   CCA Substance Use Alcohol/Drug Use: Alcohol / Drug Use Pain Medications: See MAR Prescriptions: See MAR Over the Counter: See MAR History of alcohol / drug use?: No history of alcohol / drug abuse  ASAM's:  Six Dimensions of Multidimensional Assessment  Dimension 1:  Acute Intoxication and/or Withdrawal  Potential:      Dimension 2:  Biomedical Conditions and Complications:      Dimension 3:  Emotional, Behavioral, or Cognitive Conditions and Complications:     Dimension 4:  Readiness to Change:     Dimension 5:  Relapse, Continued use, or Continued Problem Potential:     Dimension 6:  Recovery/Living Environment:     ASAM Severity Score:    ASAM Recommended Level of Treatment:     Substance use Disorder (SUD) N/A   Recommendations for Services/Supports/Treatments: N/A   DSM5 Diagnoses: Patient Active Problem List  Diagnosis Date Noted   Atrial fibrillation with rapid ventricular response (HCC) 04/09/2023   Amputation stump infection (HCC) 04/03/2023   Sepsis (HCC) 04/03/2023   Adhesive capsulitis of left shoulder 02/09/2023   Abdominal pain 10/27/2022   Bereavement 10/31/2021   Hx of BKA, left (HCC) 04/26/2021   Osteomyelitis of left foot (HCC) 04/01/2021   Atherosclerosis of native arteries of the extremities with gangrene (HCC) 03/08/2021   Atherosclerosis of artery of extremity with rest pain (HCC) 02/14/2021   Weight gain due to medication 01/06/2021   Primary insomnia 08/03/2020   No-show for appointment 06/22/2020   MDD (major depressive disorder), recurrent, in partial remission (HCC) 05/31/2020   At risk for prolonged QT interval syndrome 04/28/2020   Recurrent major depression resistant to treatment (HCC) 04/28/2020   MDD (major depressive disorder), recurrent, in full remission (HCC) 10/29/2019   Insomnia due to mental condition 07/30/2019   Obsessive compulsive disorder 01/23/2019   MDD (major depressive disorder), recurrent episode, severe (HCC) 01/22/2019   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   MDD (major depressive disorder), recurrent episode, moderate (HCC) 01/15/2019   GAD (generalized anxiety disorder) 01/15/2019   Toxic effect of petroleum products, intentional self-harm, sequela (HCC) 01/01/2019   Suicide attempt (HCC) 01/01/2019   Tracheostomy in  place [facility]) 01/01/2019   Closed fracture of lateral malleolus of right fibula 11/18/2018   Type 1 diabetes mellitus (HCC) 09/14/2016   Major depressive disorder, recurrent, severe without psychotic features (HCC) 09/13/2016   Acquired hypothyroidism 09/13/2016   HTN (hypertension) 09/13/2016   Dyslipidemia 09/13/2016   GERD (gastroesophageal reflux disease) 09/13/2016   H/O laryngectomy 06/30/2014   Dysphagia 06/22/2014   Anxiety state 12/22/2013   Acute embolism and thrombosis of unspecified deep veins of unspecified lower extremity (HCC) 09/15/2013   History of tracheostomy 09/15/2013   Hyperlipidemia 09/15/2013   Hyperlipidemia due to type 1 diabetes mellitus (HCC) 09/15/2013   Thromboembolism of deep veins of lower extremity (HCC) 09/15/2013   Airway obstruction 08/19/2013   Subglottic stenosis 08/19/2013   Laryngeal stridor 06/19/2013   NSTEMI (non-ST elevated myocardial infarction) (HCC) 06/19/2013   Tobacco dependence in remission 06/12/2013   Autonomic instability 06/04/2013   Encephalopathy 05/21/2013   Bronchopneumonia 05/21/2013   Stroke (HCC) 05/21/2013   Acute kidney injury (HCC) 05/09/2013   Disease characterized by destruction of skeletal muscle 05/09/2013   Leukocytosis 05/09/2013   Major depressive disorder, single episode, unspecified 05/09/2013   Transaminitis 05/09/2013    Referrals to Alternative Service(s): Referred to Alternative Service(s):   Place:   Date:   Time:    Referred to Alternative Service(s):   Place:   Date:   Time:    Referred to Alternative Service(s):   Place:   Date:   Time:    Referred to Alternative Service(s):   Place:   Date:   Time:      Collaboration of Care: Medication Management AEB chart review  Summary: Macrae is a 63 year old widowed Caucasian male.  He presents to [facility] to establish outpatient therapy services.  He is already engaged in medication management with Dr. Elna Breslow, last seen on 06/11/23.  He reports the following  concerns, "I just don't even feel like I want to be here anymore. I'm just tired of fighting. Everybody keeps telling me I can do this, I can do that, but it's not as easy as everybody says. 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 He is approximately 70% healed and will need to complete healing before he can get another prosthetic.    Reynolds appeared dysphoric but oriented x 5.  He was neatly dressed and appeared well-groomed.  He spoke through a tracheostomy, but his speech was otherwise normal in tone and volume.  His thought content/process was logical and linear.  He endorsed passive thoughts of SI.  He denied HI.  Kaleab has a history of suicide attempts with his last attempt being a few years ago.  He denied AVH and did not appear to be responding to internal stimuli.  He scored severe on anxiety screening and moderately severe on depression screening.  He does not denied concerns for mania, OCD, or ADHD.  Kaid was raised by both parents.  He stated "it was a good childhood." Deklen has 1 brother and 1 sister.  He reports he talks to his brother every once in a while but does not have much of a relationship with his sister. She is busy running a business.  Yunior currently resides with his parents and reports they have a good relationship.  Sharone has been a widow for 3 years.  He has 2 adult daughters and 2 adult sons.  He states their relationships are "distant, very distant.  Tyreece notes that he is lacking support.  Emmett completed high school without any issues.  He notes he had attended 3 years of college and was working towards a Production designer, theatre/television/film.  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  He has been on disability since 2015 due to multiple physical health issues.  He has an amputated leg and is needing another prostatic after suffering from an infection.   Vashon does not report or identify any hobbies and he struggles to identify strengths or abilities.  Rajvir meets criteria for the following: F33.2 Major depressive disorder, recurrent, severe, without psychotic features AEB depressed mood most of the day, nearly every day; feelings of hopelessness, worthlessness, or emptiness; significant weight changes; sleep disturbances of insomnia/hypersomnia; fatigue; diminished ability to think/concentrate; and recurrent thoughts of suicide or self-harm. F41.1 Generalized anxiety disorder AEB excessive anxiety or worry occurring more days than not for at least 6 months; restlessness, fatigue, difficulty concentrating, irritability, muscle tension, and sleep disturbance which causes significant distress or impairment in social, occupational, or other important areas of functioning.  Recommendations: Cephus is recommended to continue with medication management and engage in outpatient therapy.  He is in agreement with these recommendations. He has been advised of confidentiality limitations and no-show policy.   Patient/Guardian was advised Release of Information must be obtained prior to any record release in order to collaborate their care with an outside provider. Patient/Guardian was advised if they have not already done so to contact the registration department to sign all necessary forms in order for [place] to release information regarding their care.   Consent: Patient/Guardian gives verbal consent for treatment and assignment of benefits for services provided during this visit. Patient/Guardian expressed understanding and agreed to proceed.   [name], [facility]

## 2023-07-14 DIAGNOSIS — E1029 Type 1 diabetes mellitus with other diabetic kidney complication: Secondary | ICD-10-CM | POA: Diagnosis not present

## 2023-07-18 ENCOUNTER — Encounter: Payer: Self-pay | Admitting: Physician Assistant

## 2023-07-18 DIAGNOSIS — S81802A Unspecified open wound, left lower leg, initial encounter: Secondary | ICD-10-CM | POA: Diagnosis not present

## 2023-07-18 DIAGNOSIS — T81328A Disruption or dehiscence of closure of other specified internal operation (surgical) wound, initial encounter: Secondary | ICD-10-CM | POA: Diagnosis not present

## 2023-07-18 DIAGNOSIS — T8781 Dehiscence of amputation stump: Secondary | ICD-10-CM | POA: Diagnosis not present

## 2023-07-23 ENCOUNTER — Ambulatory Visit (INDEPENDENT_AMBULATORY_CARE_PROVIDER_SITE_OTHER): Payer: Self-pay | Admitting: Psychiatry

## 2023-07-23 ENCOUNTER — Encounter: Payer: Self-pay | Admitting: Psychiatry

## 2023-07-23 VITALS — BP 138/76 | HR 70 | Temp 98.6°F

## 2023-07-23 DIAGNOSIS — F3341 Major depressive disorder, recurrent, in partial remission: Secondary | ICD-10-CM | POA: Diagnosis not present

## 2023-07-23 DIAGNOSIS — F411 Generalized anxiety disorder: Secondary | ICD-10-CM | POA: Diagnosis not present

## 2023-07-23 DIAGNOSIS — Z634 Disappearance and death of family member: Secondary | ICD-10-CM | POA: Diagnosis not present

## 2023-07-23 DIAGNOSIS — F5101 Primary insomnia: Secondary | ICD-10-CM | POA: Diagnosis not present

## 2023-07-23 MED ORDER — OLANZAPINE 5 MG PO TABS: 5.0000 mg | ORAL_TABLET | Freq: Every day | ORAL | 5 refills | Status: DC

## 2023-07-23 NOTE — Progress Notes (Unsigned)
 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  Patient presented for a follow-up appointment.  He is managing major depression with olanzapine, Wellbutrin, and clonazepam. He feels better compared to previous visits and denies suicidal thoughts.  He is managing generalized anxiety disorder with clonazepam and has no significant anxiety symptoms at this time.  He experiences difficulty falling asleep but sleeps well once asleep. He is taking Ambien 10 mg as needed and reports improved sleep, though it sometimes takes up to half an hour to fall asleep.  He is currently taking olanzapine 5 mg, ambien 10 mg, wellbutrin 300 mg, and clonazepam 1 mg daily.   He spends time watching TV and basketball but notes limited social interaction due to others' work schedules. He occasionally reads magazines to engage his mind.  He reports left shoulder pain and is attending physical therapy for this issue. He is actively participating in exercises to manage the pain.  He is healing from the wound on his leg, s/p  amputation. The wound is healing, although slowly, and he attends wound care appointments where progress is monitored.  As per collateral information obtained from mother patient is currently doing  well.     Visit Diagnosis:    ICD-10-CM   1. MDD (major depressive disorder), recurrent, in partial remission (HCC)  F33.41 OLANZapine (ZYPREXA) 5 MG tablet    2. GAD (generalized anxiety disorder)  F41.1     3. Primary insomnia  F51.01     4. Bereavement  Z63.4       Past Psychiatric History: I have reviewed past psychiatric history from progress note on 01/15/2019.  Past trials of fluoxetine, Rexulti, Paxil, Celexa, Lexapro, Wellbutrin, Zyprexa, Seroquel, Depakote, Xanax, Abilify.  Completed TMS- Twice in 2021, 2024(October).  History of ECT-did not tolerate it.  Multiple inpatient behavioral health admissions in the past-2020.  Possible remote suicide attempt several years ago when he tried to overdose on bug spray as well as had plan to jump in front of a train although did not go through with it.  History of overdose on opioids in 2014 however reports it was not a suicide attempt and that he was trying to get high and it was accidental.  Past Medical History:  Past Medical History:  Diagnosis Date   Depression    Diabetes (HCC)    Insulin Pump   Diabetes mellitus type I (HCC)    Diabetes mellitus without complication (HCC)    GERD (gastroesophageal reflux disease)    H/O laryngectomy    Heel bone fracture    History of embolic stroke    Hyperlipidemia    Hypertension    Radicular pain of right lower extremity    Stroke (HCC)    Suicide attempt (HCC) 2014   damaged larynx - tracheostomy   Thyroid disease     Past  Surgical History:  Procedure Laterality Date   COLONOSCOPY WITH PROPOFOL N/A 05/15/2018   Procedure: COLONOSCOPY WITH PROPOFOL;  Surgeon: [name], Boykin Nearing, MD;  Location: [facility] ENDOSCOPY;  Service: Gastroenterology;  Laterality: N/A;   ESOPHAGOGASTRODUODENOSCOPY N/A 05/15/2018   Procedure: ESOPHAGOGASTRODUODENOSCOPY (EGD);  Surgeon: [name], Boykin Nearing, MD;  Location: [facility] ENDOSCOPY;  Service: Gastroenterology;  Laterality: N/A;   FRACTURE SURGERY     Heel bone  reconstruction Left    HERNIA REPAIR  02/2011   Umbilical hernia repair    LARYNGECTOMY     LOWER EXTREMITY ANGIOGRAPHY Left 01/31/2021   Procedure: LOWER EXTREMITY ANGIOGRAPHY;  Surgeon: Annice Needy, MD;  Location: [facility] INVASIVE CV LAB;  Service: Cardiovascular;  Laterality: Left;   LOWER EXTREMITY ANGIOGRAPHY Right 02/14/2021   Procedure: LOWER EXTREMITY ANGIOGRAPHY;  Surgeon: Annice Needy, MD;  Location: [facility] INVASIVE CV LAB;  Service: Cardiovascular;  Laterality: Right;   NECK SURGERY     fusion   SPINE SURGERY     TRACHEOSTOMY  2014   from SI attempt    Family Psychiatric History: I have reviewed family psychiatric history from progress note on 01/15/2019.  Family History:  Family History  Problem Relation Age of Onset   Osteoporosis Mother    Diabetes Mother    Hypertension Father    Mental illness Neg Hx     Social History: I have reviewed social history from my progress note on 01/15/2019. Social History   Socioeconomic History   Marital status: Widowed    Spouse name: Toniann Fail   Number of children: Not on file   Years of education: Not on file   Highest education level: Not on file  Occupational History   Not on file  Tobacco Use   Smoking status: Former    Current packs/day: 0.00    Types: Cigarettes    Quit date: 04/09/2013    Years since quitting: 10.2   Smokeless tobacco: Never  Vaping Use   Vaping status: Never Used  Substance and Sexual Activity   Alcohol use: Yes    Alcohol/week: 2.0 standard drinks of alcohol    Types: 2 Shots of liquor per week   Drug use: No    Comment: Pt denied; UDS not available   Sexual activity: Yes    Partners: Female    Birth control/protection: Condom  Other Topics Concern   Not on file  Social History Narrative   Not on file   Social Drivers of Health   Financial Resource Strain: Low Risk  (07/12/2023)   Overall Financial Resource Strain (CARDIA)    Difficulty of Paying Living Expenses: Not very hard  Food Insecurity:  No Food Insecurity (07/12/2023)   Hunger Vital Sign    Worried About Running Out of Food in the Last Year: Never true    Ran Out of Food in the Last Year: Never true  Transportation Needs: No Transportation Needs (07/12/2023)   PRAPARE - Administrator, Civil Service (Medical): No    Lack of Transportation (Non-Medical): No  Physical Activity: Sufficiently Active (07/12/2023)   Exercise Vital Sign    Days of Exercise per Week: 7 days    Minutes of Exercise per Session: 30 min  Stress: Stress Concern Present (07/12/2023)   [employer] of Occupational Health - Occupational Stress Questionnaire    Feeling of Stress : Very much  Social Connections: Socially Isolated (07/12/2023)   Social Connection and Isolation Panel [NHANES]    Frequency of Communication with Friends and  Family: More than three times a week    Frequency of Social Gatherings with Friends and Family: Never    Attends Religious Services: Never    Database administrator or Organizations: No    Attends Banker Meetings: Never    Marital Status: Widowed    Allergies:  Allergies  Allergen Reactions   Buspar [Buspirone]     Makes the patient "flip out"   Depakote [Valproic Acid]     Causes excessive drowsiness   Pregabalin Other (See Comments)    Gum Bleeding   Clopidogrel Rash   Gabapentin Itching and Rash    Metabolic Disorder Labs: Lab Results  Component Value Date   HGBA1C 7.4 (H) 10/28/2022   MPG 165.68 10/28/2022   MPG 177 02/14/2021   No results found for: "PROLACTIN" Lab Results  Component Value Date   CHOL 161 09/15/2016   TRIG 190 (H) 09/15/2016   HDL 41 09/15/2016   CHOLHDL 3.9 09/15/2016   VLDL 38 09/15/2016   LDLCALC 82 09/15/2016   Lab Results  Component Value Date   TSH 1.952 09/15/2016    Therapeutic Level Labs: No results found for: "LITHIUM" Lab Results  Component Value Date   VALPROATE 19 (L) 09/12/2016   No results found for: "CBMZ"  Current  Medications: Current Outpatient Medications  Medication Sig Dispense Refill   amLODipine (NORVASC) 5 MG tablet Take 1 tablet (5 mg total) by mouth daily. 30 tablet 1   apixaban (ELIQUIS) 5 MG TABS tablet Take 1 tablet (5 mg total) by mouth 2 (two) times daily. 60 tablet 5   buPROPion (WELLBUTRIN XL) 300 MG 24 hr tablet TAKE 1 TABLET(300 MG) BY MOUTH DAILY WITH BREAKFAST 90 tablet 0   clonazePAM (KLONOPIN) 1 MG tablet TAKE 1 TABLET (1 MG TOTAL) BY MOUTH DAILY AS NEEDED FOR ANXIETY. PLEASE LIMIT USE 30 tablet 2   Continuous Glucose Sensor (DEXCOM G7 SENSOR) MISC Use 1 each every 10 (ten) days     empagliflozin (JARDIANCE) 25 MG TABS tablet Take 25 mg by mouth daily. (Patient taking differently: Take 12.5 mg by mouth daily.) 30 tablet 1   HUMALOG 100 UNIT/ML injection SMARTSIG:0-120 Unit(s) SUB-Q Daily     insulin aspart (NOVOLOG) 100 UNIT/ML injection INJECT UP TO 120 UNITS UNDER THE SKIN AS DIRECTED DAILY VIA INSULIN PUMP     levothyroxine (SYNTHROID) 75 MCG tablet TAKE 1 TABLET BY MOUTH DAILY AT 6 AM 90 tablet 0   metoprolol succinate (TOPROL-XL) 25 MG 24 hr tablet TAKE 1 TABLET(25 MG) BY MOUTH EVERY DAY     ondansetron (ZOFRAN-ODT) 4 MG disintegrating tablet Take 4 mg by mouth every 8 (eight) hours as needed for nausea or vomiting.     pantoprazole (PROTONIX) 40 MG tablet TAKE 1 TABLET(40 MG) BY MOUTH TWICE DAILY     rosuvastatin (CRESTOR) 20 MG tablet Take 20 mg by mouth at bedtime.     sodium hypochlorite (DAKIN'S 1/4 STRENGTH) 0.125 % SOLN      telmisartan (MICARDIS) 80 MG tablet Take 80 mg by mouth daily.     zolpidem (AMBIEN) 10 MG tablet TAKE 1 TABLET(10 MG) BY MOUTH AT BEDTIME AS NEEDED FOR SLEEP 30 tablet 2   mometasone (ELOCON) 0.1 % cream Apply to rash on arms and chest twice daily until improved. (Patient not taking: Reported on 07/23/2023) 45 g 1   OLANZapine (ZYPREXA) 5 MG tablet Take 1 tablet (5 mg total) by mouth at bedtime. 30 tablet 5   No  current facility-administered  medications for this visit.     Musculoskeletal: Strength & Muscle Tone:  UTA Gait & Station:  In a wheelchair Patient leans: N/A  Psychiatric Specialty Exam: Review of Systems  Psychiatric/Behavioral: Negative.      Blood pressure 138/76, pulse 70, temperature 98.6 F (37 C), temperature source Temporal, SpO2 100%.There is no height or weight on file to calculate BMI.  General Appearance: Casual  Eye Contact:  Fair  Speech:  Clear and Coherent  Volume:  Normal  Mood:  Euthymic  Affect:  Full Range  Thought Process:  Goal Directed and Descriptions of Associations: Intact  Orientation:  Full (Time, Place, and Person)  Thought Content: Logical   Suicidal Thoughts:  No  Homicidal Thoughts:  No  Memory:  Immediate;   Fair Recent;   Fair Remote;   Fair  Judgement:  Fair  Insight:  Fair  Psychomotor Activity:  Normal  Concentration:  Concentration: Fair and Attention Span: Fair  Recall:  [employer] of Knowledge: Fair  Language: Fair  Akathisia:  No  Handed:  Right  AIMS (if indicated): denies tremors, muscle spasms  Assets:  Desire for Improvement Housing Social Support  ADL's:  Intact  Cognition: WNL  Sleep:  Fair   Screenings: Midwife Visit from 12/28/2022 in [facility] Office Visit from 04/05/2022 in [facility] Video Visit from 10/31/2021 in [facility] Office Visit from 08/30/2021 in [facility] Office Visit from 07/05/2021 in [facility]  AIMS Total Score 0 0 0 0 0      AUDIT    Flowsheet Row Admission (Discharged) from 01/22/2019 in [facility] BE[facility]IC[facility] Admission (Discharged) from 12/31/2018 in [facility] BEHAV[facility]AL MEDICINE Admission (Discharged) from 09/14/2016 in [facility] INPATIENT BEHAV[facility]AL MEDICINE  Alcohol Use Disorder  Identification Test Final Score (AUDIT) 0 0 1      ECT-MADRS    Flowsheet Row Admission (Discharged) from 01/22/2019 in [facility] INPATIENT BEHAVIORAL MEDICINE  MADRS Total Score 27      GAD-7    Flowsheet Row Office Visit from 07/23/2023 in [facility] Counselor from 07/12/2023 in [facility] Office Visit from 03/20/2023 in [facility] Video Visit from 08/01/2022 in [facility] Office Visit from 06/06/2022 in [facility]  Total GAD-7 Score 2 17 8 6 3       Mini-Mental    Flowsheet Row Admission (Discharged) from 01/22/2019 in [facility] INPATIENT BEHAVIORAL MEDICINE  Total Score (max 30 points ) 30      PHQ2-9    Flowsheet Row Office Visit from 07/23/2023 in [facility] Counselor from 07/12/2023 in [facility] Office Visit from 03/20/2023 in [facility] Office Visit from 12/28/2022 in [facility] Video Visit from 09/28/2022 in [name]  PHQ-2 Total Score 2 3 2 6 3   PHQ-9 Total Score 6 16 7 22 13       Flowsheet Row Office Visit from 07/23/2023 in [facility] Counselor from 07/12/2023 in [facility] Office Visit from 06/11/2023 in [facility]  C-SSRS RISK CATEGORY Moderate Risk Moderate Risk Moderate Risk  Assessment and Plan: [name] is a 63 year old male, has a history of multiple medical problems including depression, anxiety, insomnia status post recent infection of his prosthetic left leg currently healing, discussed assessment and plan as noted below.  Major  Depressive Disorder-in partial remission [name] reports improvement in depressive symptoms with current treatment regimen of olanzapine, Wellbutrin, and clonazepam. He engages in activities like watching TV and reading, indicating positive progress. No suicidal ideation reported. Therapy sessions are infrequent due to scheduling issues. - Continue Olanzapine 5 mg daily ( EKG - 05/28/23- Qtc- 437) - Continue Wellbutrin 300 mg daily - Continue Clonazepam 1 mg daily as needed - Reviewed [place] PMP AWARxE - Encourage reading and mental exercises to stimulate cognitive function - Patient to continue CBT with Ms. Pricilla Loveless has upcoming appointment scheduled.   Generalized Anxiety Disorder-improving [place] reports improvement in anxiety symptoms with clonazepam. He engages in activities that help manage anxiety. - Continue Clonazepam 1 mg daily as needed - Encourage engagement in relaxing activities such as watching TV and reading - Patient to continue CBT with Ms. Pricilla Loveless  Primary Insomnia-improving [name] experiences difficulty falling asleep but maintains sleep once achieved. Ambien 10 mg as needed is effective in managing insomnia. - Continue Ambien 10 mg as needed for sleep - Continue sleep hygiene techniques  Bereavement-stable Patient currently denies any significant symptoms related to grief. - Patient to continue CBT  Collateral information obtained from mother Ms. Dorrene German who reports patient is making progress and improving.  Follow-up - Follow-up in clinic in 8 weeks or sooner if needed.   Collaboration of Care: Collaboration of Care: Referral or follow-up with counselor/therapist AEB encouraged to continue CBT has scheduled appointments.  Patient/Guardian was advised Release of Information must be obtained prior to any record release in order to collaborate their care with an outside provider. Patient/Guardian was advised if they have not already done so to contact the registration  department to sign all necessary forms in order for [place] to release information regarding their care.   Consent: Patient/Guardian gives verbal consent for treatment and assignment of benefits for services provided during this visit. Patient/Guardian expressed understanding and agreed to proceed.  Discussed the use of a AI scribe software for clinical note transcription with the patient, who gave verbal consent to proceed.  This note was generated in part or whole with voice recognition software. Voice recognition is usually quite accurate but there are transcription errors that can and very often do occur. I apologize for any typographical errors that were not detected and corrected.     Jomarie Longs, MD 07/24/2023, 4:35 PM

## 2023-07-24 DIAGNOSIS — S88119S Complete traumatic amputation at level between knee and ankle, unspecified lower leg, sequela: Secondary | ICD-10-CM | POA: Diagnosis not present

## 2023-07-24 DIAGNOSIS — T8744 Infection of amputation stump, left lower extremity: Secondary | ICD-10-CM | POA: Diagnosis not present

## 2023-07-25 ENCOUNTER — Encounter: Payer: Self-pay | Admitting: Physician Assistant

## 2023-07-25 DIAGNOSIS — I639 Cerebral infarction, unspecified: Secondary | ICD-10-CM | POA: Diagnosis not present

## 2023-07-25 DIAGNOSIS — Z89512 Acquired absence of left leg below knee: Secondary | ICD-10-CM | POA: Diagnosis not present

## 2023-07-25 DIAGNOSIS — T81328A Disruption or dehiscence of closure of other specified internal operation (surgical) wound, initial encounter: Secondary | ICD-10-CM | POA: Diagnosis not present

## 2023-07-25 DIAGNOSIS — T8781 Dehiscence of amputation stump: Secondary | ICD-10-CM | POA: Diagnosis not present

## 2023-07-25 DIAGNOSIS — J9620 Acute and chronic respiratory failure, unspecified whether with hypoxia or hypercapnia: Secondary | ICD-10-CM | POA: Diagnosis not present

## 2023-07-25 DIAGNOSIS — Z930 Tracheostomy status: Secondary | ICD-10-CM | POA: Diagnosis not present

## 2023-07-25 DIAGNOSIS — Z9002 Acquired absence of larynx: Secondary | ICD-10-CM | POA: Diagnosis not present

## 2023-07-25 DIAGNOSIS — R220 Localized swelling, mass and lump, head: Secondary | ICD-10-CM | POA: Diagnosis not present

## 2023-07-26 DIAGNOSIS — Z9002 Acquired absence of larynx: Secondary | ICD-10-CM | POA: Diagnosis not present

## 2023-07-26 DIAGNOSIS — I639 Cerebral infarction, unspecified: Secondary | ICD-10-CM | POA: Diagnosis not present

## 2023-07-26 DIAGNOSIS — Z930 Tracheostomy status: Secondary | ICD-10-CM | POA: Diagnosis not present

## 2023-07-26 DIAGNOSIS — Z89512 Acquired absence of left leg below knee: Secondary | ICD-10-CM | POA: Diagnosis not present

## 2023-07-26 DIAGNOSIS — J9620 Acute and chronic respiratory failure, unspecified whether with hypoxia or hypercapnia: Secondary | ICD-10-CM | POA: Diagnosis not present

## 2023-07-26 DIAGNOSIS — R220 Localized swelling, mass and lump, head: Secondary | ICD-10-CM | POA: Diagnosis not present

## 2023-07-27 DIAGNOSIS — F32A Depression, unspecified: Secondary | ICD-10-CM | POA: Diagnosis not present

## 2023-07-27 DIAGNOSIS — E1029 Type 1 diabetes mellitus with other diabetic kidney complication: Secondary | ICD-10-CM | POA: Diagnosis not present

## 2023-07-27 DIAGNOSIS — Z930 Tracheostomy status: Secondary | ICD-10-CM | POA: Diagnosis not present

## 2023-07-27 DIAGNOSIS — T8781 Dehiscence of amputation stump: Secondary | ICD-10-CM | POA: Diagnosis not present

## 2023-07-27 DIAGNOSIS — I10 Essential (primary) hypertension: Secondary | ICD-10-CM | POA: Diagnosis not present

## 2023-07-27 DIAGNOSIS — G8929 Other chronic pain: Secondary | ICD-10-CM | POA: Diagnosis not present

## 2023-07-27 DIAGNOSIS — I4891 Unspecified atrial fibrillation: Secondary | ICD-10-CM | POA: Diagnosis not present

## 2023-07-27 DIAGNOSIS — E1051 Type 1 diabetes mellitus with diabetic peripheral angiopathy without gangrene: Secondary | ICD-10-CM | POA: Diagnosis not present

## 2023-08-01 ENCOUNTER — Encounter: Payer: Self-pay | Admitting: Internal Medicine

## 2023-08-01 DIAGNOSIS — T81328A Disruption or dehiscence of closure of other specified internal operation (surgical) wound, initial encounter: Secondary | ICD-10-CM | POA: Diagnosis not present

## 2023-08-01 DIAGNOSIS — T8781 Dehiscence of amputation stump: Secondary | ICD-10-CM | POA: Diagnosis not present

## 2023-08-08 ENCOUNTER — Encounter: Payer: Self-pay | Attending: Physician Assistant | Admitting: Physician Assistant

## 2023-08-08 DIAGNOSIS — Z89512 Acquired absence of left leg below knee: Secondary | ICD-10-CM | POA: Insufficient documentation

## 2023-08-08 DIAGNOSIS — E10622 Type 1 diabetes mellitus with other skin ulcer: Secondary | ICD-10-CM | POA: Diagnosis not present

## 2023-08-08 DIAGNOSIS — L97822 Non-pressure chronic ulcer of other part of left lower leg with fat layer exposed: Secondary | ICD-10-CM | POA: Insufficient documentation

## 2023-08-08 DIAGNOSIS — T8131XA Disruption of external operation (surgical) wound, not elsewhere classified, initial encounter: Secondary | ICD-10-CM | POA: Diagnosis not present

## 2023-08-08 DIAGNOSIS — T8781 Dehiscence of amputation stump: Secondary | ICD-10-CM | POA: Diagnosis not present

## 2023-08-08 DIAGNOSIS — Z7901 Long term (current) use of anticoagulants: Secondary | ICD-10-CM | POA: Insufficient documentation

## 2023-08-10 DIAGNOSIS — M5412 Radiculopathy, cervical region: Secondary | ICD-10-CM | POA: Diagnosis not present

## 2023-08-10 DIAGNOSIS — M542 Cervicalgia: Secondary | ICD-10-CM | POA: Diagnosis not present

## 2023-08-10 DIAGNOSIS — M6281 Muscle weakness (generalized): Secondary | ICD-10-CM | POA: Diagnosis not present

## 2023-08-13 DIAGNOSIS — E1029 Type 1 diabetes mellitus with other diabetic kidney complication: Secondary | ICD-10-CM | POA: Diagnosis not present

## 2023-08-14 DIAGNOSIS — E1029 Type 1 diabetes mellitus with other diabetic kidney complication: Secondary | ICD-10-CM | POA: Diagnosis not present

## 2023-08-15 ENCOUNTER — Encounter: Payer: Self-pay | Admitting: Physician Assistant

## 2023-08-15 DIAGNOSIS — T8131XA Disruption of external operation (surgical) wound, not elsewhere classified, initial encounter: Secondary | ICD-10-CM | POA: Diagnosis not present

## 2023-08-15 DIAGNOSIS — T8781 Dehiscence of amputation stump: Secondary | ICD-10-CM | POA: Diagnosis not present

## 2023-08-15 DIAGNOSIS — L97822 Non-pressure chronic ulcer of other part of left lower leg with fat layer exposed: Secondary | ICD-10-CM | POA: Diagnosis not present

## 2023-08-15 DIAGNOSIS — Z89512 Acquired absence of left leg below knee: Secondary | ICD-10-CM | POA: Diagnosis not present

## 2023-08-16 ENCOUNTER — Ambulatory Visit (INDEPENDENT_AMBULATORY_CARE_PROVIDER_SITE_OTHER): Admitting: Professional Counselor

## 2023-08-16 DIAGNOSIS — F411 Generalized anxiety disorder: Secondary | ICD-10-CM | POA: Diagnosis not present

## 2023-08-16 DIAGNOSIS — F331 Major depressive disorder, recurrent, moderate: Secondary | ICD-10-CM

## 2023-08-16 NOTE — Progress Notes (Signed)
   THERAPIST PROGRESS NOTE  Session Time: 1:00 PM - 1:56 PM  Participation Level: Active  Behavioral Response: Well Groomed, Alert, Depressed  Type of Therapy: Individual Therapy  Treatment Goals addressed: Active OP Depression  LTG: "I want to quit thinking about all these things I keep thinking about. I need to learn to deal with my laryngectomy and losing my leg. I need to stop having these negative thoughts. It's all just overwhelming to me."     Start:  08/16/23    Expected End:  08/14/24     STG: "I need to quit thinking negative." To improve thinking patterns AEB identifying and restructuring maladaptive patterns of thinking over the next 12 weeks.    STG: "I want to deal with my depression." To reduce symptoms of depression AEB reduction in PHQ9 scores by using coping mechanisms over the next 12 weeks.    STG: "Just being able to cope with my disability. That's really hard for me." To reduce the impact of disability AEB processing events and coming to a place of acceptance over the next 12 weeks.    ProgressTowards Goals: Initial  Interventions: CBT, Motivational Interviewing, and Supportive  Summary: [name] is a 63 y.o. male who presents with a history of anxiety and depression. He appeared somber but oriented x5. He stated he is going to the wound doctor weekly and it's slowly healing. He also does PT 2x a week and that seems to be helping also. However, [name] continues to struggle with depression and acceptance of his disability. He engaged in developing his treatment plan. He actively listened to coping skills and engaged in 5-4-3-2-1 grounding mechanism. [name] expressed concerns with his follow-thru on using coping skills but was open to ways to overcome obstacles to him practicing.   Therapist Response: Conducted session with [name]. Began session with check-in/update since previous session. Utilized empathetic and reflective listening. Developed treatment plan with  input from [name] on current strengths, needs, and progress towards goals. Explained various coping mechanisms (breathing exercises, STOP, TIP, RAIN, EFT tapping) and engaged [name] in 5-4-3-2-1 grounding mechanism. Highlighted the importance of practicing skills. Used Socratic questioning to challenge [name]'s thoughts on his lack of time to practice. Assisted with problem solving obstacles to practicing (visual reminders, phone alarm, habit stacking). Scheduled additional appointment and concluded session.   Suicidal/Homicidal: No  Plan: Return again in 2 weeks.  Diagnosis: MDD (major depressive disorder), recurrent episode, moderate (HCC)  GAD (generalized anxiety disorder)  Collaboration of Care: Medication Management AEB chart review  Patient/Guardian was advised Release of Information must be obtained prior to any record release in order to collaborate their care with an outside provider. Patient/Guardian was advised if they have not already done so to contact the registration department to sign all necessary forms in order for [place] to release information regarding their care.   Consent: Patient/Guardian gives verbal consent for treatment and assignment of benefits for services provided during this visit. Patient/Guardian expressed understanding and agreed to proceed.   [name], [facility] 08/16/2023

## 2023-08-22 ENCOUNTER — Encounter: Payer: Self-pay | Admitting: Physician Assistant

## 2023-08-22 DIAGNOSIS — T8781 Dehiscence of amputation stump: Secondary | ICD-10-CM | POA: Diagnosis not present

## 2023-08-22 DIAGNOSIS — L97822 Non-pressure chronic ulcer of other part of left lower leg with fat layer exposed: Secondary | ICD-10-CM | POA: Diagnosis not present

## 2023-08-22 DIAGNOSIS — Z89512 Acquired absence of left leg below knee: Secondary | ICD-10-CM | POA: Diagnosis not present

## 2023-08-22 DIAGNOSIS — T8131XA Disruption of external operation (surgical) wound, not elsewhere classified, initial encounter: Secondary | ICD-10-CM | POA: Diagnosis not present

## 2023-08-24 DIAGNOSIS — T85638A Leakage of other specified internal prosthetic devices, implants and grafts, initial encounter: Secondary | ICD-10-CM | POA: Diagnosis not present

## 2023-08-24 DIAGNOSIS — Z9002 Acquired absence of larynx: Secondary | ICD-10-CM | POA: Diagnosis not present

## 2023-08-25 DIAGNOSIS — Z9002 Acquired absence of larynx: Secondary | ICD-10-CM | POA: Diagnosis not present

## 2023-08-25 DIAGNOSIS — R220 Localized swelling, mass and lump, head: Secondary | ICD-10-CM | POA: Diagnosis not present

## 2023-08-25 DIAGNOSIS — Z89512 Acquired absence of left leg below knee: Secondary | ICD-10-CM | POA: Diagnosis not present

## 2023-08-25 DIAGNOSIS — I639 Cerebral infarction, unspecified: Secondary | ICD-10-CM | POA: Diagnosis not present

## 2023-08-25 DIAGNOSIS — J9620 Acute and chronic respiratory failure, unspecified whether with hypoxia or hypercapnia: Secondary | ICD-10-CM | POA: Diagnosis not present

## 2023-08-25 DIAGNOSIS — Z930 Tracheostomy status: Secondary | ICD-10-CM | POA: Diagnosis not present

## 2023-08-26 DIAGNOSIS — R220 Localized swelling, mass and lump, head: Secondary | ICD-10-CM | POA: Diagnosis not present

## 2023-08-26 DIAGNOSIS — J9620 Acute and chronic respiratory failure, unspecified whether with hypoxia or hypercapnia: Secondary | ICD-10-CM | POA: Diagnosis not present

## 2023-08-26 DIAGNOSIS — Z89512 Acquired absence of left leg below knee: Secondary | ICD-10-CM | POA: Diagnosis not present

## 2023-08-26 DIAGNOSIS — Z9002 Acquired absence of larynx: Secondary | ICD-10-CM | POA: Diagnosis not present

## 2023-08-26 DIAGNOSIS — I639 Cerebral infarction, unspecified: Secondary | ICD-10-CM | POA: Diagnosis not present

## 2023-08-26 DIAGNOSIS — Z930 Tracheostomy status: Secondary | ICD-10-CM | POA: Diagnosis not present

## 2023-08-27 DIAGNOSIS — E1069 Type 1 diabetes mellitus with other specified complication: Secondary | ICD-10-CM | POA: Diagnosis not present

## 2023-08-27 NOTE — Progress Notes (Signed)
 [facility] ADULT SPEECH THERAPY [place] OUTPATIENT SPEECH PATHOLOGY 08/24/2023   Patient Name: [name] Date of Birth:1960-06-20 Session Number: 1 Diagnosis: history of larynx cancer, s/p total laryngectomy/TEP  Date of Evaluation: 08/24/23 Date of Symptom Onset: 03/27/23 Referred by: Dr. Feliciano Archer Reason for Referral: Evaluation for use, Fit Voice Prosthesis  ASSESSMENT:  SLP donned gloves prior to physical contact with patient. Patient arrived in clinic due to contiuing granulation tissue burden and feeling air resistance to breathing esp. when lying on side. Distal redundant protrusions again noted along bilateral tracheal wall not obstructive to breathing. Further, mild redundant tissue (granulation tissue) encroaching over left side of TEP prosthesis flange remaining.He independently removed his 9/55 fenestrated LaryTube + HME combination from tracheostoma. No breathing complaint at rest. Mild leak is noted at TEP prosthesis. TEP prosthesis removed and replaced with 20 French, 10mm [employer] Dualvalve NS. No leaks noted post placement. Patient able to voice well. SLP then placed custom fenestrated 9/55 Larytube back insitu at tracheostoma using hemostat crimping method. Paient exited clinic with fluent TEP speech and fluent verbal communication. Will continue to work with Archer for resolution of tracheal wall granulation and possible tracheitis effect.  PLAN:    for   SLP Alaryngeal Prosthesis Recommendations: Return to clinic PRN with prosthesis leak or malfunction  Goals:  Patient and Family Goals: Maintain fluent TEP speech   SUBJECTIVE:  [name] is a 63 y.o. 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 Pt seen today for TEP/tracheostoma prosthesis assessment.  Communication Preference: Verbal   Barriers to Learning: No Barriers   Hearing  Exceptions: No hearing aid  Prior treatment for referral reason: No      Pain?: No     Precautions: None   Prior Function: Independent         Lives With: Mother Past Medical History:  Diagnosis Date  . CVA (cerebral vascular accident) 04/2013   initial right sided weakness  . Depression   . Diabetes mellitus   . DVT (deep venous thrombosis) 06/2013   R arm, L leg on Plavix for a period of time  . Hypercholesteremia   . Hypertension   . Hypothyroidism   . Tracheal stenosis 2015    Family History  Problem Relation Age of Onset  . Diabetes Mother   . Heart failure Maternal Grandfather   . Anesthesia problems Neg Hx   . Bleeding Disorder Neg Hx    Past Surgical History:  Procedure Laterality Date  . FOOT SURGERY    . NECK SURGERY    . PR AMPUTATION LOW LEG THRU TIB/FIB Left 04/12/2021   Procedure: AMPUTA LEG THRU TIBIA & FIBULA;  Surgeon: Lang Glean Devonshire, MD;  Location: MAIN OR [facility];  Service: Vascular  . PR BRONCHOSCOPY W/TRANSBRONCL NDL ASPIR BX EA LOBE Midline 07/15/2013   Procedure: BRONCHOSCOPY, RIGID OR FLEXIBLE, W/FLUORO; W/TRANSBRONCHIAL NEEDLE ASPIRATION BIOPSY(S), EACH ADD`L LOBE;  Surgeon: Adline LOISE Blanch, MD;  Location: MAIN OR [facility];  Service: ENT  . PR BRONCHOSCOPY,TRACH/BRONCH DILATN Bilateral 09/03/2013   Procedure: BRONCHOSCOPY, RIGID/FLEXIBLE, INCL FLUOROSCOPIC GUIDANCE; W/TRACHIAL/BRONCH DILATION OR CLOSED REDUCTION FX;  Surgeon: Genell LOISE Fairly, MD;  Location: MAIN OR [facility];  Service: ENT  . PR CREATE T-E FISTULA+SPEECH PROSTHESIS Midline 03/30/2014   Procedure: CONSTRUCTION OF TRACHEOESOPHAGEAL FISTULA & SUBSEQUENT INSERTION OF AN ALARYNGEAL SPEECH PROSTHESIS;  Surgeon: Adline LOISE Blanch, MD;  Location: MAIN OR [facility];  Service: ENT  . PR CRICOPHAYNGEAL MYOTOMY Midline 03/30/2014   Procedure: CRICOPHARYNGEAL MYOTOMY;  Surgeon: Adline LOISE Blanch, MD;  Location: MAIN OR [name];  Service: ENT  . PR DRAIN LOWER LEG DEEP ABSC/HEMATOMA Left 05/07/2021   Procedure: I&D LEG/ANK; DEEP  ABSCESS/HEMATOMA;  Surgeon: Pierce Maryelizabeth Finn, MD;  Location: MAIN OR [facility];  Service: Vascular  . PR ESOPHAGOSCOPY,DIAGNOSTIC Midline 07/15/2013   Procedure: ESOPHAGOSCOPY, RIGID OR FLEXIBLE; DIAGNOSTIC, W/WO COLLECTION OF SPECIMEN(S) BY BRUSHING OR WASHING;  Surgeon: Adline LOISE Blanch, MD;  Location: MAIN OR [facility];  Service: ENT  . PR INCISION & DRAINAGE COMPLEX PO WOUND INFECTION Left 04/06/2023   Procedure: INCISION AND DRAINAGE, COMPLEX, POSTOPERATIVE WOUND INFECTION; FOOT;  Surgeon: Luwanna Caretha Anis, MD;  Location: OR [facility];  Service: Vascular  . PR LARYNGOSCOPY,DIRCT,OP SCOP,EXC TUMR Bilateral 09/03/2013   Procedure: LARYNGOSCOPY, DIRECT, OPERATIVE, W/EXCISION TUMOR &/OR STRIPPING VOCAL CORD/EPIGLOTTIS; W/OPERA MICRO/TELES;  Surgeon: Genell LOISE Fairly, MD;  Location: MAIN OR [facility];  Service: ENT  . PR LARYNGOSCOPY,DIRCT,OP SCOPE,BIOPSY Midline 07/15/2013   Procedure: LARYNGOSCOPY, DIRECT, OPERATIVE, WITH BIOPSY; WITH OPERATING MICROSCOPE OR TELESCOPE;  Surgeon: Adline LOISE Blanch, MD;  Location: MAIN OR [facility];  Service: ENT  . PR LARYNGOSCOPY,DIRCT,OP SCOPE,BIOPSY N/A 10/29/2013   Procedure: LARYNGOSCOPY, DIRECT, OPERATIVE, WITH BIOPSY; WITH OPERATING MICROSCOPE OR TELESCOPE;  Surgeon: Genell LOISE Fairly, MD;  Location: MAIN OR [facility];  Service: ENT  . PR LARYNGOSCOPY,DIRECT,DX,OP MICROSCOP Midline 07/23/2013   Procedure: LARYNGOSCOPY DIRECT WITH OR WITHOUT TRACHEOSCPY; DIAGNOSTIC, WITH OPERATING MICROSCOPE OR TELESCOPE;  Surgeon: Adline LOISE Blanch, MD;  Location: MAIN OR [facility];  Service: ENT  . PR REMOVAL OF LARYNX N/A 03/30/2014   Procedure: LARYNGECTOMY; TOT WO RADICAL NECK DISSECTION;  Surgeon: Adline LOISE Blanch, MD;  Location: MAIN OR [facility];  Service: ENT  . PR REVISE TRACHEOSTOMY SCAR N/A 12/13/2017   Procedure: REVISION OF TRACHEOSTOMY SCAR;  Surgeon: Adline Micki Blanch, MD;  Location: MAIN OR [facility];  Service: ENT  . PR TRACH REVISION,SIMPLE N/A 12/13/2017   Procedure: TRACHEOSTOMA REVIS; SIMPL WO FLAP  ROTATION;  Surgeon: Adline Micki Blanch, MD;  Location: MAIN OR [facility];  Service: ENT  . PR TRACH REVISION,SIMPLE N/A 04/23/2018   Procedure: TRACHEOSTOMA REVIS; SIMPL WO FLAP ROTATION;  Surgeon: Adline Micki Blanch, MD;  Location: MAIN OR [facility];  Service: ENT  . PR TRACHEOSTOMY, PLANNED Midline 07/15/2013   Procedure: TRACHEOSTOMY PLANNED (SEPART PROC);  Surgeon: Adline LOISE Blanch, MD;  Location: MAIN OR [facility];  Service: ENT  . PR UPPER GI ENDOSCOPY,BIOPSY N/A 08/03/2014   Procedure: UGI ENDOSCOPY; WITH BIOPSY, SINGLE OR MULTIPLE;  Surgeon: Enos LILLETTE Bean, MD;  Location: GI [facility];  Service: Gastroenterology  . SPINAL FUSION      Allergies  Allergen Reactions  . Pregabalin Anaphylaxis    Mouth swelling and bleeding gums  . Buspirone     Makes the patient flip out Patient currently taking  . Valproic Acid      Causes excessive drowsiness  . Gabapentin Itching and Rash  . Plavix [Clopidogrel] Rash   Social History   Tobacco Use  . Smoking status: Former    Current packs/day: 0.00    Average packs/day: 0.5 packs/day for 25.0 years (12.5 ttl pk-yrs)    Types: Cigarettes    Start date: 04/07/1988    Quit date: 04/07/2013    Years since quitting: 10.3  . Smokeless tobacco: Never  Substance Use Topics  . Alcohol use: Not Currently    Current Outpatient Medications  Medication Sig Dispense Refill  .  acetaminophen  (TYLENOL ) 325 MG tablet Take 2 tablets (650 mg total) by mouth every six (6) hours as needed for pain. 30 tablet 0  . amLODIPine  (NORVASC ) 10 MG tablet Take 1 tablet (10 mg total) by mouth daily. 90 tablet 0  . aspirin  (ECOTRIN) 81 MG tablet Take 1 tablet (81 mg total) by mouth daily.    . buPROPion  (WELLBUTRIN  XL) 300 MG 24 hr tablet Take 1 tablet (300 mg total) by mouth every morning.    . chlorthalidone  (HYGROTON ) 25 MG tablet Take 1 tablet (25 mg total) by mouth daily. 30 tablet 0  . ciprofloxacin-dexAMETHasone (CIPRODEX) 0.3-0.1 % otic suspension Apply 5  drops into the lary stoma twice daily. Please place these drops without the Lary tube in place and replace the lary tube once finished. (Patient not taking: Reported on 07/24/2023) 12 mL 0  . ELIQUIS  5 mg Tab Take 1 tablet (5 mg total) by mouth two (2) times a day.    . empagliflozin  (JARDIANCE ) 25 mg tablet Take 0.5 tablets (12.5 mg total) by mouth daily.    . [Paused] HUMALOG U-100 INSULIN  100 unit/mL injection ADMINISTER UP TO 120 UNITS IN PUMP DAILY AS DIRECTED    . insulin  lispro (HUMALOG) 100 unit/mL injection pen Inject 8 Units under the skin Three (3) times a day before meals. Also use as sliding scale and inject additional insulin  based on blood glucose vales: BG 150-189: 1 unit; BG 190-229: 2 units; BG 230-269: 3 units; BG 270-309: 4 units; BG 310-349: 5 units; BG: 350-389: 6 units; BG >390: 7 units 15 mL 0  . insulin  NPH isoph U-100 human (HUMULIN) 100 unit/mL (3 mL) injection pen Inject 20 Units under the skin two (2) times a day. 12 mL 0  . levothyroxine  (SYNTHROID ) 75 MCG tablet Take 1 tablet (75 mcg total) by mouth daily. 30 tablet 0  . losartan  (COZAAR ) 100 MG tablet Take 1 tablet (100 mg total) by mouth daily.    . methocarbamol (ROBAXIN) 500 MG tablet Take 1 tablet (500 mg total) by mouth two (2) times a day.    . metoprolol  succinate (TOPROL -XL) 25 MG 24 hr tablet Take 1 tablet (25 mg total) by mouth daily.    . multivitamins, therapeutic with minerals 9 mg iron-400 mcg tablet Take 1 tablet by mouth daily. 30 tablet 0  . nortriptyline  (PAMELOR ) 25 MG capsule Take 1 capsule (25 mg total) by mouth nightly. (Patient not taking: Reported on 07/24/2023)    . OLANZapine  (ZYPREXA ) 5 MG tablet Take 1 tablet (5 mg total) by mouth nightly.    . pantoprazole  (PROTONIX ) 40 MG tablet Take 1 tablet (40 mg total) by mouth Two (2) times a day (30 minutes before a meal).    . pen needle, diabetic 32 gauge x 5/32 (4 mm) Ndle Use with insulin  up to 4 times/day as needed. 100 each 0  . rosuvastatin   (CRESTOR ) 20 MG tablet Take 1 tablet (20 mg total) by mouth in the morning.    SABRA spironolactone (ALDACTONE) 25 MG tablet Take 0.5 tablets (12.5 mg total) by mouth daily. (Patient not taking: Reported on 07/24/2023) 15 tablet 0  . telmisartan (MICARDIS) 80 MG tablet Take 1 tablet (80 mg total) by mouth daily.    . zolpidem  (AMBIEN ) 10 mg tablet Take 1 tablet (10 mg total) by mouth nightly.     No current facility-administered medications for this encounter.    OBJECTIVE: Prosthesis Placement Type: Replacement Prosthesis Brand: [employer] Prosthesis Type: Indwelling, Danney  Prosthesis Size: 20 FR Prosthesis Length: 10 Dilation Method: 22 FR Dilator, 18 FR Dilator Placement Confirmation: No leakage during water leak test, Rotation of prosthesis intact Stoma Support: HME, Lary Tube Stoma Hygiene - Change to Stoma Care: Lary clips TEP Support - Change to TEP accessories: TE Prosthesis brush/pipette Patient provided with: Insurance underwriter, S/SX of prosthesis failure  Duration of Visit: 30  I attest that I have reviewed the above information. Signed: Redell Monas, CCC-SLP 08/24/2023 5:35 PM

## 2023-08-28 ENCOUNTER — Other Ambulatory Visit: Payer: Self-pay | Admitting: Psychiatry

## 2023-08-28 DIAGNOSIS — F5101 Primary insomnia: Secondary | ICD-10-CM

## 2023-08-28 DIAGNOSIS — S81802A Unspecified open wound, left lower leg, initial encounter: Secondary | ICD-10-CM | POA: Diagnosis not present

## 2023-08-29 ENCOUNTER — Encounter: Payer: Self-pay | Admitting: Physician Assistant

## 2023-08-29 DIAGNOSIS — T8781 Dehiscence of amputation stump: Secondary | ICD-10-CM | POA: Diagnosis not present

## 2023-08-29 DIAGNOSIS — Z89512 Acquired absence of left leg below knee: Secondary | ICD-10-CM | POA: Diagnosis not present

## 2023-08-29 DIAGNOSIS — L97822 Non-pressure chronic ulcer of other part of left lower leg with fat layer exposed: Secondary | ICD-10-CM | POA: Diagnosis not present

## 2023-08-29 DIAGNOSIS — T8131XA Disruption of external operation (surgical) wound, not elsewhere classified, initial encounter: Secondary | ICD-10-CM | POA: Diagnosis not present

## 2023-08-30 ENCOUNTER — Ambulatory Visit (INDEPENDENT_AMBULATORY_CARE_PROVIDER_SITE_OTHER): Admitting: Professional Counselor

## 2023-08-30 DIAGNOSIS — F331 Major depressive disorder, recurrent, moderate: Secondary | ICD-10-CM

## 2023-08-30 NOTE — Progress Notes (Signed)
  THERAPIST PROGRESS NOTE  Session Time: 1:00 PM - 1:45 PM  Participation Level: Active  Behavioral Response: Well Groomed, Alert, Dysphoric  Type of Therapy: Individual Therapy  Treatment Goals addressed: Active OP Depression  LTG: "I want to quit thinking about all these things I keep thinking about. I need to learn to deal with my laryngectomy and losing my leg. I need to stop having these negative thoughts. It's all just overwhelming to me."                 Start:  08/16/23    Expected End:  08/14/24      STG: "I need to quit thinking negative." To improve thinking patterns AEB identifying and restructuring maladaptive patterns of thinking over the next 12 weeks.     STG: "I want to deal with my depression." To reduce symptoms of depression AEB reduction in PHQ9 scores by using coping mechanisms over the next 12 weeks.     STG: "Just being able to cope with my disability. That's really hard for me." To reduce the impact of disability AEB processing events and coming to a place of acceptance over the next 12 weeks.    ProgressTowards Goals: Progressing  Interventions: CBT, Motivational Interviewing, and Supportive  Summary: [name] is a 63 y.o. male who presents with a history of anxiety and depression. He appeared somber but oriented x5. He stated he has practiced box breathing but isn't sure about the other skills. He appeared receptive to review and expressed understanding about need to practice. [name] engaged in Diplomatic Services operational officer exercise. He was able to identify negative patterns in his thoughts and was able to restructure those thoughts with minimal assistance. [name] reported he can see the difference in the emotions attached to each set of thoughts. He will try to practice skills and reframe negative thinking.    Therapist Response: Conducted session with [name]. Began session with check-in/update since previous session. Utilized empathetic and reflective listening. Reviewed coping  skills to ensure understanding of skill and reminded [name] to practice consistently. Engaged [name] in writing exercise "Cracking the NUTS and eliminating the ANTS." Provided psychoeducation on cognitive model and cognitive distortions. Assisted with identifying patterns of thinking (fortune telling, emotional reasoning, and all/nothing thinking) and restructuring negative thoughts. Scheduled additional appointment and concluded session.   Suicidal/Homicidal: No  Plan: Return again in 2 weeks.  Diagnosis: MDD (major depressive disorder), recurrent episode, moderate (HCC)  Collaboration of Care: Medication Management AEB chart review  Patient/Guardian was advised Release of Information must be obtained prior to any record release in order to collaborate their care with an outside provider. Patient/Guardian was advised if they have not already done so to contact the registration department to sign all necessary forms in order for us  to release information regarding their care.   Consent: Patient/Guardian gives verbal consent for treatment and assignment of benefits for services provided during this visit. Patient/Guardian expressed understanding and agreed to proceed.   [name], [facility] 08/30/2023

## 2023-09-03 DIAGNOSIS — E1069 Type 1 diabetes mellitus with other specified complication: Secondary | ICD-10-CM | POA: Diagnosis not present

## 2023-09-05 ENCOUNTER — Encounter: Payer: Self-pay | Admitting: Physician Assistant

## 2023-09-05 DIAGNOSIS — L97822 Non-pressure chronic ulcer of other part of left lower leg with fat layer exposed: Secondary | ICD-10-CM | POA: Diagnosis not present

## 2023-09-05 DIAGNOSIS — T8131XA Disruption of external operation (surgical) wound, not elsewhere classified, initial encounter: Secondary | ICD-10-CM | POA: Diagnosis not present

## 2023-09-05 DIAGNOSIS — Z89512 Acquired absence of left leg below knee: Secondary | ICD-10-CM | POA: Diagnosis not present

## 2023-09-05 DIAGNOSIS — T8781 Dehiscence of amputation stump: Secondary | ICD-10-CM | POA: Diagnosis not present

## 2023-09-10 DIAGNOSIS — Z9002 Acquired absence of larynx: Secondary | ICD-10-CM | POA: Diagnosis not present

## 2023-09-10 DIAGNOSIS — E1069 Type 1 diabetes mellitus with other specified complication: Secondary | ICD-10-CM | POA: Diagnosis not present

## 2023-09-11 DIAGNOSIS — L739 Follicular disorder, unspecified: Secondary | ICD-10-CM | POA: Diagnosis not present

## 2023-09-12 ENCOUNTER — Encounter: Payer: Self-pay | Attending: Physician Assistant | Admitting: Physician Assistant

## 2023-09-12 DIAGNOSIS — E10622 Type 1 diabetes mellitus with other skin ulcer: Secondary | ICD-10-CM | POA: Diagnosis not present

## 2023-09-12 DIAGNOSIS — M542 Cervicalgia: Secondary | ICD-10-CM | POA: Diagnosis not present

## 2023-09-12 DIAGNOSIS — Z9641 Presence of insulin pump (external) (internal): Secondary | ICD-10-CM | POA: Insufficient documentation

## 2023-09-12 DIAGNOSIS — L97822 Non-pressure chronic ulcer of other part of left lower leg with fat layer exposed: Secondary | ICD-10-CM | POA: Diagnosis not present

## 2023-09-12 DIAGNOSIS — Y839 Surgical procedure, unspecified as the cause of abnormal reaction of the patient, or of later complication, without mention of misadventure at the time of the procedure: Secondary | ICD-10-CM | POA: Diagnosis not present

## 2023-09-12 DIAGNOSIS — Z794 Long term (current) use of insulin: Secondary | ICD-10-CM | POA: Diagnosis not present

## 2023-09-12 DIAGNOSIS — T8131XA Disruption of external operation (surgical) wound, not elsewhere classified, initial encounter: Secondary | ICD-10-CM | POA: Diagnosis not present

## 2023-09-12 DIAGNOSIS — T8781 Dehiscence of amputation stump: Secondary | ICD-10-CM | POA: Diagnosis not present

## 2023-09-12 DIAGNOSIS — M6281 Muscle weakness (generalized): Secondary | ICD-10-CM | POA: Diagnosis not present

## 2023-09-12 DIAGNOSIS — Z7901 Long term (current) use of anticoagulants: Secondary | ICD-10-CM | POA: Insufficient documentation

## 2023-09-12 DIAGNOSIS — Z89512 Acquired absence of left leg below knee: Secondary | ICD-10-CM | POA: Diagnosis not present

## 2023-09-12 DIAGNOSIS — M5412 Radiculopathy, cervical region: Secondary | ICD-10-CM | POA: Diagnosis not present

## 2023-09-13 ENCOUNTER — Ambulatory Visit (INDEPENDENT_AMBULATORY_CARE_PROVIDER_SITE_OTHER): Admitting: Professional Counselor

## 2023-09-13 DIAGNOSIS — F331 Major depressive disorder, recurrent, moderate: Secondary | ICD-10-CM | POA: Diagnosis not present

## 2023-09-13 DIAGNOSIS — E1029 Type 1 diabetes mellitus with other diabetic kidney complication: Secondary | ICD-10-CM | POA: Diagnosis not present

## 2023-09-13 NOTE — Progress Notes (Signed)
  THERAPIST PROGRESS NOTE  Session Time: 2:00 PM - 2:42 PM   Participation Level: Active  Behavioral Response: Casual, Alert, Dysphoric  Type of Therapy: Individual Therapy  Treatment Goals addressed: Active OP Depression  LTG: "I want to quit thinking about all these things I keep thinking about. I need to learn to deal with my laryngectomy and losing my leg. I need to stop having these negative thoughts. It's all just overwhelming to me."                 Start:  08/16/23    Expected End:  08/14/24      STG: "I need to quit thinking negative." To improve thinking patterns AEB identifying and restructuring maladaptive patterns of thinking over the next 12 weeks.     STG: "I want to deal with my depression." To reduce symptoms of depression AEB reduction in PHQ9 scores by using coping mechanisms over the next 12 weeks.     STG: "Just being able to cope with my disability. That's really hard for me." To reduce the impact of disability AEB processing events and coming to a place of acceptance over the next 12 weeks.    ProgressTowards Goals: Progressing  Interventions: Motivational Interviewing, Solution Focused, and Supportive  Summary: [name] is a 63 y.o. male who presents with a history of anxiety and depression. He appeared alert and oriented x5. He stated things are the same. He is trying to decide whether to continue PT or not. He engaged in discussing pros/cons. He also discussed whether or not to continue with this therapy. He continues to believe he will not fully progress until he is able to get a prosthetic and walk again. Laryan appeared ambivalent to other possible solutions to helping him be independent and active while he is waiting to heal for a prosthetic. He took note of another online support group that meets today.   Therapist Response: Conducted session with Bearl Limes. Began session with check-in/update since previous session. Utilized empathetic and reflective  listening. Explored pros/cons of continuing PT for shoulder. Reviewed treatment plan and discussed progress and obstacles to progress. Noted Sora's patterns of thinking and used Socratic questioning to help challenge thinking. Explored potential solutions to gaining independence and being more active. Provided additional online support group for amputees. Confirmed next appointment and concluded session.   Suicidal/Homicidal: No  Plan: Return again in 2 weeks.  Diagnosis: MDD (major depressive disorder), recurrent episode, moderate (HCC)  Collaboration of Care: Medication Management AEB chart review  Patient/Guardian was advised Release of Information must be obtained prior to any record release in order to collaborate their care with an outside provider. Patient/Guardian was advised if they have not already done so to contact the registration department to sign all necessary forms in order for us  to release information regarding their care.   Consent: Patient/Guardian gives verbal consent for treatment and assignment of benefits for services provided during this visit. Patient/Guardian expressed understanding and agreed to proceed.   [name], [facility] 09/13/2023

## 2023-09-19 ENCOUNTER — Encounter: Payer: Self-pay | Admitting: Physician Assistant

## 2023-09-19 DIAGNOSIS — T8781 Dehiscence of amputation stump: Secondary | ICD-10-CM | POA: Diagnosis not present

## 2023-09-19 DIAGNOSIS — T8131XA Disruption of external operation (surgical) wound, not elsewhere classified, initial encounter: Secondary | ICD-10-CM | POA: Diagnosis not present

## 2023-09-19 DIAGNOSIS — L97822 Non-pressure chronic ulcer of other part of left lower leg with fat layer exposed: Secondary | ICD-10-CM | POA: Diagnosis not present

## 2023-09-19 DIAGNOSIS — Z89512 Acquired absence of left leg below knee: Secondary | ICD-10-CM | POA: Diagnosis not present

## 2023-09-20 DIAGNOSIS — T8744 Infection of amputation stump, left lower extremity: Secondary | ICD-10-CM | POA: Diagnosis not present

## 2023-09-20 DIAGNOSIS — F32A Depression, unspecified: Secondary | ICD-10-CM | POA: Diagnosis not present

## 2023-09-20 DIAGNOSIS — I10 Essential (primary) hypertension: Secondary | ICD-10-CM | POA: Diagnosis not present

## 2023-09-20 DIAGNOSIS — T8781 Dehiscence of amputation stump: Secondary | ICD-10-CM | POA: Diagnosis not present

## 2023-09-20 DIAGNOSIS — G8929 Other chronic pain: Secondary | ICD-10-CM | POA: Diagnosis not present

## 2023-09-20 DIAGNOSIS — I4891 Unspecified atrial fibrillation: Secondary | ICD-10-CM | POA: Diagnosis not present

## 2023-09-20 DIAGNOSIS — E1051 Type 1 diabetes mellitus with diabetic peripheral angiopathy without gangrene: Secondary | ICD-10-CM | POA: Diagnosis not present

## 2023-09-21 DIAGNOSIS — Z430 Encounter for attention to tracheostomy: Secondary | ICD-10-CM | POA: Diagnosis not present

## 2023-09-21 DIAGNOSIS — H2513 Age-related nuclear cataract, bilateral: Secondary | ICD-10-CM | POA: Diagnosis not present

## 2023-09-21 DIAGNOSIS — E103293 Type 1 diabetes mellitus with mild nonproliferative diabetic retinopathy without macular edema, bilateral: Secondary | ICD-10-CM | POA: Diagnosis not present

## 2023-09-24 DIAGNOSIS — Z9002 Acquired absence of larynx: Secondary | ICD-10-CM | POA: Diagnosis not present

## 2023-09-24 DIAGNOSIS — Z89512 Acquired absence of left leg below knee: Secondary | ICD-10-CM | POA: Diagnosis not present

## 2023-09-24 DIAGNOSIS — R220 Localized swelling, mass and lump, head: Secondary | ICD-10-CM | POA: Diagnosis not present

## 2023-09-24 DIAGNOSIS — I639 Cerebral infarction, unspecified: Secondary | ICD-10-CM | POA: Diagnosis not present

## 2023-09-24 DIAGNOSIS — Z930 Tracheostomy status: Secondary | ICD-10-CM | POA: Diagnosis not present

## 2023-09-24 DIAGNOSIS — J9620 Acute and chronic respiratory failure, unspecified whether with hypoxia or hypercapnia: Secondary | ICD-10-CM | POA: Diagnosis not present

## 2023-09-25 DIAGNOSIS — Z89512 Acquired absence of left leg below knee: Secondary | ICD-10-CM | POA: Diagnosis not present

## 2023-09-25 DIAGNOSIS — Z930 Tracheostomy status: Secondary | ICD-10-CM | POA: Diagnosis not present

## 2023-09-25 DIAGNOSIS — J9620 Acute and chronic respiratory failure, unspecified whether with hypoxia or hypercapnia: Secondary | ICD-10-CM | POA: Diagnosis not present

## 2023-09-25 DIAGNOSIS — R220 Localized swelling, mass and lump, head: Secondary | ICD-10-CM | POA: Diagnosis not present

## 2023-09-25 DIAGNOSIS — I639 Cerebral infarction, unspecified: Secondary | ICD-10-CM | POA: Diagnosis not present

## 2023-09-25 DIAGNOSIS — Z9002 Acquired absence of larynx: Secondary | ICD-10-CM | POA: Diagnosis not present

## 2023-09-25 DIAGNOSIS — S81802A Unspecified open wound, left lower leg, initial encounter: Secondary | ICD-10-CM | POA: Diagnosis not present

## 2023-09-25 DIAGNOSIS — Z430 Encounter for attention to tracheostomy: Secondary | ICD-10-CM | POA: Diagnosis not present

## 2023-09-26 ENCOUNTER — Encounter: Payer: Self-pay | Admitting: Physician Assistant

## 2023-09-26 ENCOUNTER — Encounter: Payer: Self-pay | Admitting: Psychiatry

## 2023-09-26 ENCOUNTER — Other Ambulatory Visit: Payer: Self-pay | Admitting: Psychiatry

## 2023-09-26 ENCOUNTER — Ambulatory Visit (INDEPENDENT_AMBULATORY_CARE_PROVIDER_SITE_OTHER): Admitting: Psychiatry

## 2023-09-26 ENCOUNTER — Other Ambulatory Visit: Payer: Self-pay

## 2023-09-26 VITALS — BP 124/76 | HR 67 | Temp 97.4°F | Ht 72.0 in | Wt 240.0 lb

## 2023-09-26 DIAGNOSIS — F3341 Major depressive disorder, recurrent, in partial remission: Secondary | ICD-10-CM | POA: Diagnosis not present

## 2023-09-26 DIAGNOSIS — Z89512 Acquired absence of left leg below knee: Secondary | ICD-10-CM | POA: Diagnosis not present

## 2023-09-26 DIAGNOSIS — F411 Generalized anxiety disorder: Secondary | ICD-10-CM

## 2023-09-26 DIAGNOSIS — T8131XA Disruption of external operation (surgical) wound, not elsewhere classified, initial encounter: Secondary | ICD-10-CM | POA: Diagnosis not present

## 2023-09-26 DIAGNOSIS — F5101 Primary insomnia: Secondary | ICD-10-CM

## 2023-09-26 DIAGNOSIS — L97822 Non-pressure chronic ulcer of other part of left lower leg with fat layer exposed: Secondary | ICD-10-CM | POA: Diagnosis not present

## 2023-09-26 DIAGNOSIS — Z634 Disappearance and death of family member: Secondary | ICD-10-CM

## 2023-09-26 DIAGNOSIS — T8781 Dehiscence of amputation stump: Secondary | ICD-10-CM | POA: Diagnosis not present

## 2023-09-26 MED ORDER — BUPROPION HCL ER (XL) 300 MG PO TB24
ORAL_TABLET | ORAL | 1 refills | Status: DC
Start: 2023-09-26 — End: 2024-04-04

## 2023-09-26 NOTE — Progress Notes (Signed)
 BH MD OP Progress Note  09/26/2023 4:07 PM [name]  MRN:  [id number]  Chief Complaint:  Chief Complaint  Patient presents with   Follow-up   Depression   Anxiety   Medication Refill   Discussed the use of AI scribe software for clinical note transcription with the patient, who gave verbal consent to proceed.  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  He is currently taking clonazepam , wellbutrin  XL 300 mg daily, zolpidem  for sleep, and olanzapine  5 mg at night.  He believes his mood symptoms may have improved compared to a few months ago.  He is currently in psychotherapy however he does not know if therapy sessions are beneficial.  He has been referred to support groups however he has not found anything that is a good fit for him. He frequently feels down, depressed, or hopeless, attributing it to his inability to leave the house independently due to mobility issues. He is unable to drive or walk to a car, which limits his social interactions and contributes to his feelings of sadness. His mood improves when he is able to go out.  He experiences persistent shoulder pain despite completing physical therapy, which was paused due to insurance requirements. The pain is less severe than before but still present.  He has difficulty falling asleep but sleeps well once asleep with the help of Ambien . He feels tired and has low energy,  particularly in the afternoons and evenings.  He lives alone and manages his daily activities, including cooking and household tasks, despite being in a wheelchair. He has limited social interactions, primarily texting with family and occasionally receiving visits from friends. He feels uncomfortable attending church due to mobility challenges and a lack of suitable clothing.  He denies any current suicidality, homicidality or perceptual disturbances.   Visit Diagnosis:    ICD-10-CM   1. Recurrent major depressive disorder, in partial remission (HCC)  F33.41     2. GAD (generalized anxiety disorder)  F41.1     3. Primary insomnia  F51.01     4. Bereavement  Z63.4 buPROPion  (WELLBUTRIN  XL) 300 MG 24 hr tablet      Past Psychiatric History: I have reviewed past psychiatric history from progress note on 01/15/2019.  Past trials of fluoxetine, Rexulti , Paxil, Celexa , Lexapro , Wellbutrin , Zyprexa , Seroquel , Depakote , Xanax , Abilify .  Completed TMS-twice in 2021, 2024(October).  History of ECT-did not tolerate it.  Multiple inpatient behavioral health admissions in the past-2020.  Possible remote suicide attempt several years ago when he tried to overdose on bugs spray as well as had plan to jump in front of a train although did not go through with it.  History of overdose on opioids in 2014 however reports it was not a suicide attempt and that he was trying to get high and it was accidental.  Past Medical History:  Past Medical History:  Diagnosis Date   Depression    Diabetes (HCC)    Insulin  Pump   Diabetes mellitus type I (HCC)  Diabetes mellitus without complication (HCC)    GERD (gastroesophageal reflux disease)    H/O laryngectomy    Heel bone fracture    History of embolic stroke    Hyperlipidemia    Hypertension    Radicular pain of right lower extremity    Stroke [facility])    Suicide attempt (HCC) 2014   damaged larynx - tracheostomy   Thyroid  disease     Past Surgical  History:  Procedure Laterality Date   COLONOSCOPY WITH PROPOFOL  N/A 05/15/2018   Procedure: COLONOSCOPY WITH PROPOFOL ;  Surgeon: [name], Alphonsus Jeans, MD;  Location: [facility] ENDOSCOPY;  Service: Gastroenterology;  Laterality: N/A;   ESOPHAGOGASTRODUODENOSCOPY N/A 05/15/2018   Procedure: ESOPHAGOGASTRODUODENOSCOPY (EGD);  Surgeon: [name], Alphonsus Jeans, MD;  Location: [facility] ENDOSCOPY;  Service: Gastroenterology;  Laterality: N/A;   FRACTURE SURGERY     Heel bone reconstruction Left    HERNIA REPAIR  02/2011   Umbilical hernia repair    LARYNGECTOMY     LOWER EXTREMITY ANGIOGRAPHY Left 01/31/2021   Procedure: LOWER EXTREMITY ANGIOGRAPHY;  Surgeon: Celso College, MD;  Location: [facility] INVASIVE CV LAB;  Service: Cardiovascular;  Laterality: Left;   LOWER EXTREMITY ANGIOGRAPHY Right 02/14/2021   Procedure: LOWER EXTREMITY ANGIOGRAPHY;  Surgeon: Celso College, MD;  Location: [facility] INVASIVE CV LAB;  Service: Cardiovascular;  Laterality: Right;   NECK SURGERY     fusion   SPINE SURGERY     TRACHEOSTOMY  2014   from SI attempt    Family Psychiatric History: I have reviewed family psychiatric history from progress note on 01/15/2019.  Family History:  Family History  Problem Relation Age of Onset   Osteoporosis Mother    Diabetes Mother    Hypertension Father    Mental illness Neg Hx     Social History: I have reviewed social history from progress note on 01/15/2019. Social History   Socioeconomic History   Marital status: Widowed    Spouse name: Jenette Mitchell   Number of children: Not on file   Years of education: Not on file   Highest education level: Not on file  Occupational History   Not on file  Tobacco Use   Smoking status: Former    Current packs/day: 0.00    Types: Cigarettes    Quit date: 04/09/2013    Years since quitting: 10.4   Smokeless tobacco: Never  Vaping Use   Vaping status: Never Used  Substance and Sexual Activity   Alcohol use: Yes    Alcohol/week: 2.0 standard drinks of alcohol     Types: 2 Shots of liquor per week   Drug use: No    Comment: Pt denied; UDS not available   Sexual activity: Yes    Partners: Female    Birth control/protection: Condom  Other Topics Concern   Not on file  Social History Narrative   Not on file   Social Drivers of Health   Financial Resource Strain: Low Risk  (07/12/2023)   Overall Financial Resource Strain (CARDIA)    Difficulty of Paying Living Expenses: Not very hard  Food Insecurity: No Food Insecurity (07/12/2023)   Hunger Vital Sign    Worried About Running Out of Food in the Last Year: Never true    Ran Out of Food in the Last Year: Never true  Transportation Needs: No Transportation Needs (07/12/2023)   PRAPARE - Administrator, Civil Service (Medical): No    Lack of Transportation (Non-Medical): No  Physical Activity: Sufficiently Active (07/12/2023)  Exercise Vital Sign    Days of Exercise per Week: 7 days    Minutes of Exercise per Session: 30 min  Stress: Stress Concern Present (07/12/2023)   [employer] of Occupational Health - Occupational Stress Questionnaire    Feeling of Stress : Very much  Social Connections: Socially Isolated (07/12/2023)   Social Connection and Isolation Panel [NHANES]    Frequency of Communication with Friends and Family: More than three times a week    Frequency of Social Gatherings with Friends and Family: Never    Attends Religious Services: Never    Database administrator or Organizations: No    Attends Banker Meetings: Never    Marital Status: Widowed    Allergies:  Allergies  Allergen Reactions   Buspar [Buspirone]     Makes the patient "flip out"   Depakote  [Valproic Acid ]     Causes excessive drowsiness   Pregabalin Other (See Comments)    Gum Bleeding   Clopidogrel Rash   Gabapentin Itching and Rash    Metabolic Disorder Labs: Lab Results  Component Value Date   HGBA1C 7.4 (H) 10/28/2022   MPG 165.68 10/28/2022   MPG 177 02/14/2021   No  results found for: "PROLACTIN" Lab Results  Component Value Date   CHOL 161 09/15/2016   TRIG 190 (H) 09/15/2016   HDL 41 09/15/2016   CHOLHDL 3.9 09/15/2016   VLDL 38 09/15/2016   LDLCALC 82 09/15/2016   Lab Results  Component Value Date   TSH 1.952 09/15/2016    Therapeutic Level Labs: No results found for: "LITHIUM" Lab Results  Component Value Date   VALPROATE 19 (L) 09/12/2016   No results found for: "CBMZ"  Current Medications: Current Outpatient Medications  Medication Sig Dispense Refill   amLODipine  (NORVASC ) 5 MG tablet Take 1 tablet (5 mg total) by mouth daily. 30 tablet 1   apixaban  (ELIQUIS ) 5 MG TABS tablet Take 1 tablet (5 mg total) by mouth 2 (two) times daily. 60 tablet 5   Continuous Glucose Sensor (DEXCOM G7 SENSOR) MISC Use 1 each every 10 (ten) days     empagliflozin  (JARDIANCE ) 25 MG TABS tablet Take 25 mg by mouth daily. (Patient taking differently: Take 12.5 mg by mouth daily.) 30 tablet 1   HUMALOG 100 UNIT/ML injection SMARTSIG:0-120 Unit(s) SUB-Q Daily     insulin  aspart (NOVOLOG ) 100 UNIT/ML injection INJECT UP TO 120 UNITS UNDER THE SKIN AS DIRECTED DAILY VIA INSULIN  PUMP     levothyroxine  (SYNTHROID ) 75 MCG tablet TAKE 1 TABLET BY MOUTH DAILY AT 6 AM 90 tablet 0   metoprolol  succinate (TOPROL -XL) 25 MG 24 hr tablet TAKE 1 TABLET(25 MG) BY MOUTH EVERY DAY     mometasone  (ELOCON ) 0.1 % cream Apply to rash on arms and chest twice daily until improved. 45 g 1   OLANZapine  (ZYPREXA ) 5 MG tablet Take 1 tablet (5 mg total) by mouth at bedtime. 30 tablet 5   ondansetron  (ZOFRAN -ODT) 4 MG disintegrating tablet Take 4 mg by mouth every 8 (eight) hours as needed for nausea or vomiting.     pantoprazole  (PROTONIX ) 40 MG tablet TAKE 1 TABLET(40 MG) BY MOUTH TWICE DAILY     rosuvastatin  (CRESTOR ) 20 MG tablet Take 20 mg by mouth at bedtime.     sodium hypochlorite (DAKIN'S 1/4 STRENGTH) 0.125 % SOLN      telmisartan (MICARDIS) 80 MG tablet Take 80 mg by mouth  daily.     zolpidem  (AMBIEN ) 10 MG  tablet TAKE 1 TABLET(10 MG) BY MOUTH AT BEDTIME AS NEEDED FOR SLEEP 30 tablet 2   buPROPion  (WELLBUTRIN  XL) 300 MG 24 hr tablet TAKE 1 TABLET(300 MG) BY MOUTH DAILY WITH BREAKFAST 90 tablet 1   clonazePAM  (KLONOPIN ) 1 MG tablet TAKE 1 TABLET (1 MG TOTAL) BY MOUTH DAILY AS NEEDED FOR ANXIETY. PLEASE LIMIT USE 30 tablet 5   No current facility-administered medications for this visit.     Musculoskeletal: Strength & Muscle Tone: in a wheelchair Gait & Station: seated Patient leans: Backward  Psychiatric Specialty Exam: Review of Systems  Psychiatric/Behavioral:  The patient is nervous/anxious.        Sadness, situational    Blood pressure 124/76, pulse 67, temperature (!) 97.4 F (36.3 C), temperature source Temporal, height 6' (1.829 m), weight 240 lb (108.9 kg).Body mass index is 32.55 kg/m.  General Appearance: Casual  Eye Contact:  Fair  Speech:  Normal Rate  Volume:  baseline, S/P trach  Mood:  Anxious, situational sadness due to being stuck at home  Affect:  Appropriate  Thought Process:  Goal Directed and Descriptions of Associations: Intact  Orientation:  Full (Time, Place, and Person)  Thought Content: Logical   Suicidal Thoughts:  No  Homicidal Thoughts:  No  Memory:  Immediate;   Fair Recent;   Fair Remote;   Fair  Judgement:  Fair  Insight:  Fair  Psychomotor Activity:  Normal  Concentration:  Concentration: Fair and Attention Span: Fair  Recall:  fair  Fund of Knowledge: Fair  Language: Fair  Akathisia:  No  Handed:  Right  AIMS (if indicated): done  Assets:  Communication Skills Desire for Improvement Housing Social Support  ADL's:  Intact  Cognition: WNL  Sleep:  Fair   Screenings: Midwife Visit from 09/26/2023 in [facility] Office Visit from 12/28/2022 in [facility] Office Visit from 04/05/2022 in [facility] Video Visit from 10/31/2021 in [facility] Office Visit from 08/30/2021 in [facility]  AIMS Total Score 0 0 0 0 0      AUDIT    Flowsheet Row Admission (Discharged) from 01/22/2019 in [facility] Admission (Discharged) from 12/31/2018 in [facility] BEH[facility]ICINE Admission (Discharged) from 09/14/2016 in [facility] INPATIENT BEHAVIORAL MEDICINE  Alcohol Use Disorder Identification Test Final Score (AUDIT) 0 0 1      ECT-MADRS    Flowsheet Row Admission (Discharged) from 01/22/2019 in [facility] INPATIENT BEHAVIORAL MEDICINE  MADRS Total Score 27      GAD-7    Flowsheet Row Office Visit from 07/23/2023 in [facility] Counselor from 07/12/2023 in [facility] Office Visit from 03/20/2023 in [facility] Video Visit from 08/01/2022 in [facility] Office Visit from 06/06/2022 in [facility]  Total GAD-7 Score 2 17 8 6 3       Mini-Mental    Flowsheet Row Admission (Discharged) from 01/22/2019 in [facility] INPAT[facility] BE[facility]AVIORAL MEDICINE  Total Score (max 30 points ) 30      PHQ2-9    Flowsheet Row Office Visit from 09/26/2023 in [facility] Office Visit from 07/23/2023 in [facility] Counselor from 07/12/2023 in [facility] Office Visit from 03/20/2023 in [facility] Office Visit from 12/28/2022 in [facility]  PHQ-2 Total Score 2 2 3 2 6   PHQ-9 Total Score 6 6 16 7 22       Flowsheet Row Office Visit from 09/26/2023 in [facility] Office Visit from 07/23/2023 in [facility] Counselor from 07/12/2023 in [facility]  C-SSRS RISK CATEGORY Moderate Risk Moderate Risk Moderate Risk        Assessment and Plan: [name] is a 63 year old male, has a history of multiple medical problems including depression, anxiety, insomnia ,status post recent infection of his prosthetic leg was evaluated in office today, discussed assessment and plan as noted below.  Major depressive disorder in partial remission Currently depression symptoms are improved although continues to have situational sadness due to physical limitation and limited social interaction and the need to be dependent on others for transportation. - Continue Olanzapine  5 mg daily - Continue Wellbutrin  300 mg daily - Continue Clonazepam  1 mg daily as needed - Reviewed [place] PMP AWARxE - Continue CBT, have coordinated care with Ms. Deetta Farrow. - Discussed referral for support groups, patient to discuss with psychotherapist tomorrow at his visit.  Generalized anxiety disorder-improving Currently anxiety symptoms are manageable. - Continue Clonazepam  1 mg daily - Continue CBT  Primary insomnia-improving Although difficulty falling asleep overall sleep has been okay as long as he takes his medication. - Continue Ambien  10 mg as needed - Continue sleep hygiene techniques   Follow-up Follow-up in clinic in 3 months or sooner if needed.  Collaboration of Care: Collaboration of Care: Referral or follow-up with counselor/therapist AEB encouraged to continue CBT, coordinated care with Ms. Deetta Farrow, therapist.  Patient/Guardian was advised Release of Information must be obtained prior to any record release in order to collaborate their care with an outside provider. Patient/Guardian was advised if they have not already done so to contact the registration department to sign all necessary  forms in order for us  to release information regarding their care.   Consent: Patient/Guardian gives verbal consent for treatment and assignment of benefits for services provided during this visit. Patient/Guardian expressed understanding and agreed to proceed.  This note was generated in part or whole with voice recognition software. Voice recognition is usually quite accurate but there are transcription errors that can and very often do occur. I apologize for any typographical errors that were not detected and corrected.     Juliana Boling, MD 09/27/2023, 8:29 AM

## 2023-09-27 ENCOUNTER — Ambulatory Visit (INDEPENDENT_AMBULATORY_CARE_PROVIDER_SITE_OTHER): Admitting: Professional Counselor

## 2023-09-27 DIAGNOSIS — F331 Major depressive disorder, recurrent, moderate: Secondary | ICD-10-CM

## 2023-09-27 NOTE — Progress Notes (Signed)
  THERAPIST PROGRESS NOTE  Session Time: 3:10 PM -3:50 PM   Participation Level: Active  Behavioral Response: Well Groomed, Alert, Dysphoric  Type of Therapy: Individual Therapy  Treatment Goals addressed: Active OP Depression  LTG: "I want to quit thinking about all these things I keep thinking about. I need to learn to deal with my laryngectomy and losing my leg. I need to stop having these negative thoughts. It's all just overwhelming to me."                 Start:  08/16/23    Expected End:  08/14/24      STG: "I need to quit thinking negative." To improve thinking patterns AEB identifying and restructuring maladaptive patterns of thinking over the next 12 weeks.     STG: "I want to deal with my depression." To reduce symptoms of depression AEB reduction in PHQ9 scores by using coping mechanisms over the next 12 weeks.     STG: "Just being able to cope with my disability. That's really hard for me." To reduce the impact of disability AEB processing events and coming to a place of acceptance over the next 12 weeks.    ProgressTowards Goals: Progressing  Interventions: Motivational Interviewing, Solution Focused, and Supportive  Summary: [name] is a 63 y.o. male who presents with a history of anxiety and depression. He appeared somber but oriented x5. He discussed updates with his brother. [name] also reported his wound is 98% healed so he should be able to get fitted for a prosthetic soon. He reported he tried to do the online support group but it wasn't working on his phone. He has not called the other local group to sign up for it. [name] initially expressed frustration about his phone not working, but then divulged his feelings about not being comfortable joining the group. He engaged in discussion and noted he has done things before that he hasn't felt comfortable about. He was in agreement to call during session to get on their roster. [name] expressed fears he will forget  about it and was able to set a reminder in his phone calendar to help overcome this obstacle.   Therapist Response: Conducted session with [name]. Began session with check-in/update since previous session. Utilized empathetic and reflective listening. Used open-ended questions to facilitate discussion and summarized [name]'s thoughts/feelings. Used Socratic questioning to challenge [name]'s negative thoughts about attending a support group. Assisted with calling [name] and getting [name] on [employer]. Explored possible obstacles to attending this group and provided brief explanation about Teams Meetings and encouraged [name] to put a reminder in his phone to help him remember. Scheduled additional appointment and concluded session.   Suicidal/Homicidal: No  Plan: Return again in 3 weeks.  Diagnosis: MDD (major depressive disorder), recurrent episode, moderate (HCC)  Collaboration of Care: Medication Management AEB chart review  Patient/Guardian was advised Release of Information must be obtained prior to any record release in order to collaborate their care with an outside provider. Patient/Guardian was advised if they have not already done so to contact the registration department to sign all necessary forms in order for us  to release information regarding their care.   Consent: Patient/Guardian gives verbal consent for treatment and assignment of benefits for services provided during this visit. Patient/Guardian expressed understanding and agreed to proceed.   [name], [facility] 09/27/2023

## 2023-09-28 DIAGNOSIS — E1029 Type 1 diabetes mellitus with other diabetic kidney complication: Secondary | ICD-10-CM | POA: Diagnosis not present

## 2023-10-03 ENCOUNTER — Encounter: Payer: Self-pay | Admitting: Physician Assistant

## 2023-10-03 DIAGNOSIS — L97822 Non-pressure chronic ulcer of other part of left lower leg with fat layer exposed: Secondary | ICD-10-CM | POA: Diagnosis not present

## 2023-10-03 DIAGNOSIS — Z89512 Acquired absence of left leg below knee: Secondary | ICD-10-CM | POA: Diagnosis not present

## 2023-10-03 DIAGNOSIS — T8131XA Disruption of external operation (surgical) wound, not elsewhere classified, initial encounter: Secondary | ICD-10-CM | POA: Diagnosis not present

## 2023-10-03 DIAGNOSIS — T8781 Dehiscence of amputation stump: Secondary | ICD-10-CM | POA: Diagnosis not present

## 2023-10-09 DIAGNOSIS — Z125 Encounter for screening for malignant neoplasm of prostate: Secondary | ICD-10-CM | POA: Diagnosis not present

## 2023-10-09 DIAGNOSIS — N1832 Chronic kidney disease, stage 3b: Secondary | ICD-10-CM | POA: Diagnosis not present

## 2023-10-09 DIAGNOSIS — E1022 Type 1 diabetes mellitus with diabetic chronic kidney disease: Secondary | ICD-10-CM | POA: Diagnosis not present

## 2023-10-10 ENCOUNTER — Encounter: Payer: Self-pay | Attending: Physician Assistant | Admitting: Physician Assistant

## 2023-10-10 DIAGNOSIS — X58XXXA Exposure to other specified factors, initial encounter: Secondary | ICD-10-CM | POA: Insufficient documentation

## 2023-10-10 DIAGNOSIS — Z7982 Long term (current) use of aspirin: Secondary | ICD-10-CM | POA: Insufficient documentation

## 2023-10-10 DIAGNOSIS — E10622 Type 1 diabetes mellitus with other skin ulcer: Secondary | ICD-10-CM | POA: Insufficient documentation

## 2023-10-10 DIAGNOSIS — L97822 Non-pressure chronic ulcer of other part of left lower leg with fat layer exposed: Secondary | ICD-10-CM | POA: Diagnosis not present

## 2023-10-10 DIAGNOSIS — Z89512 Acquired absence of left leg below knee: Secondary | ICD-10-CM | POA: Insufficient documentation

## 2023-10-10 DIAGNOSIS — T8131XA Disruption of external operation (surgical) wound, not elsewhere classified, initial encounter: Secondary | ICD-10-CM | POA: Insufficient documentation

## 2023-10-10 DIAGNOSIS — Z7901 Long term (current) use of anticoagulants: Secondary | ICD-10-CM | POA: Diagnosis not present

## 2023-10-10 DIAGNOSIS — Z794 Long term (current) use of insulin: Secondary | ICD-10-CM | POA: Insufficient documentation

## 2023-10-10 DIAGNOSIS — E1065 Type 1 diabetes mellitus with hyperglycemia: Secondary | ICD-10-CM | POA: Insufficient documentation

## 2023-10-10 DIAGNOSIS — T8781 Dehiscence of amputation stump: Secondary | ICD-10-CM | POA: Diagnosis not present

## 2023-10-14 DIAGNOSIS — E1029 Type 1 diabetes mellitus with other diabetic kidney complication: Secondary | ICD-10-CM | POA: Diagnosis not present

## 2023-10-15 ENCOUNTER — Ambulatory Visit: Admitting: Professional Counselor

## 2023-10-16 DIAGNOSIS — F331 Major depressive disorder, recurrent, moderate: Secondary | ICD-10-CM | POA: Diagnosis not present

## 2023-10-16 DIAGNOSIS — M25512 Pain in left shoulder: Secondary | ICD-10-CM | POA: Diagnosis not present

## 2023-10-16 DIAGNOSIS — E1022 Type 1 diabetes mellitus with diabetic chronic kidney disease: Secondary | ICD-10-CM | POA: Diagnosis not present

## 2023-10-16 DIAGNOSIS — G8929 Other chronic pain: Secondary | ICD-10-CM | POA: Diagnosis not present

## 2023-10-16 DIAGNOSIS — Z0000 Encounter for general adult medical examination without abnormal findings: Secondary | ICD-10-CM | POA: Diagnosis not present

## 2023-10-16 DIAGNOSIS — N1832 Chronic kidney disease, stage 3b: Secondary | ICD-10-CM | POA: Diagnosis not present

## 2023-10-16 DIAGNOSIS — R972 Elevated prostate specific antigen [PSA]: Secondary | ICD-10-CM | POA: Diagnosis not present

## 2023-10-16 DIAGNOSIS — Z1331 Encounter for screening for depression: Secondary | ICD-10-CM | POA: Diagnosis not present

## 2023-10-16 DIAGNOSIS — Z89512 Acquired absence of left leg below knee: Secondary | ICD-10-CM | POA: Diagnosis not present

## 2023-10-16 DIAGNOSIS — I10 Essential (primary) hypertension: Secondary | ICD-10-CM | POA: Diagnosis not present

## 2023-10-17 ENCOUNTER — Encounter: Payer: Self-pay | Admitting: Physician Assistant

## 2023-10-17 DIAGNOSIS — T8131XA Disruption of external operation (surgical) wound, not elsewhere classified, initial encounter: Secondary | ICD-10-CM | POA: Diagnosis not present

## 2023-10-17 DIAGNOSIS — Z89512 Acquired absence of left leg below knee: Secondary | ICD-10-CM | POA: Diagnosis not present

## 2023-10-17 DIAGNOSIS — L97822 Non-pressure chronic ulcer of other part of left lower leg with fat layer exposed: Secondary | ICD-10-CM | POA: Diagnosis not present

## 2023-10-17 DIAGNOSIS — T8781 Dehiscence of amputation stump: Secondary | ICD-10-CM | POA: Diagnosis not present

## 2023-10-24 ENCOUNTER — Ambulatory Visit: Payer: Self-pay | Admitting: Internal Medicine

## 2023-10-24 DIAGNOSIS — Z430 Encounter for attention to tracheostomy: Secondary | ICD-10-CM | POA: Diagnosis not present

## 2023-10-25 DIAGNOSIS — Z89512 Acquired absence of left leg below knee: Secondary | ICD-10-CM | POA: Diagnosis not present

## 2023-10-25 DIAGNOSIS — Z930 Tracheostomy status: Secondary | ICD-10-CM | POA: Diagnosis not present

## 2023-10-25 DIAGNOSIS — J9620 Acute and chronic respiratory failure, unspecified whether with hypoxia or hypercapnia: Secondary | ICD-10-CM | POA: Diagnosis not present

## 2023-10-25 DIAGNOSIS — I639 Cerebral infarction, unspecified: Secondary | ICD-10-CM | POA: Diagnosis not present

## 2023-10-25 DIAGNOSIS — Z9002 Acquired absence of larynx: Secondary | ICD-10-CM | POA: Diagnosis not present

## 2023-10-25 DIAGNOSIS — R220 Localized swelling, mass and lump, head: Secondary | ICD-10-CM | POA: Diagnosis not present

## 2023-10-26 DIAGNOSIS — J9620 Acute and chronic respiratory failure, unspecified whether with hypoxia or hypercapnia: Secondary | ICD-10-CM | POA: Diagnosis not present

## 2023-10-26 DIAGNOSIS — R220 Localized swelling, mass and lump, head: Secondary | ICD-10-CM | POA: Diagnosis not present

## 2023-10-26 DIAGNOSIS — I639 Cerebral infarction, unspecified: Secondary | ICD-10-CM | POA: Diagnosis not present

## 2023-10-26 DIAGNOSIS — Z930 Tracheostomy status: Secondary | ICD-10-CM | POA: Diagnosis not present

## 2023-10-26 DIAGNOSIS — Z9002 Acquired absence of larynx: Secondary | ICD-10-CM | POA: Diagnosis not present

## 2023-10-26 DIAGNOSIS — Z89512 Acquired absence of left leg below knee: Secondary | ICD-10-CM | POA: Diagnosis not present

## 2023-10-29 DIAGNOSIS — E1069 Type 1 diabetes mellitus with other specified complication: Secondary | ICD-10-CM | POA: Diagnosis not present

## 2023-11-06 DIAGNOSIS — G8929 Other chronic pain: Secondary | ICD-10-CM | POA: Diagnosis not present

## 2023-11-06 DIAGNOSIS — M19012 Primary osteoarthritis, left shoulder: Secondary | ICD-10-CM | POA: Diagnosis not present

## 2023-11-06 DIAGNOSIS — W010XXD Fall on same level from slipping, tripping and stumbling without subsequent striking against object, subsequent encounter: Secondary | ICD-10-CM | POA: Diagnosis not present

## 2023-11-13 DIAGNOSIS — E1029 Type 1 diabetes mellitus with other diabetic kidney complication: Secondary | ICD-10-CM | POA: Diagnosis not present

## 2023-11-15 ENCOUNTER — Ambulatory Visit (INDEPENDENT_AMBULATORY_CARE_PROVIDER_SITE_OTHER): Admitting: Professional Counselor

## 2023-11-15 DIAGNOSIS — F3341 Major depressive disorder, recurrent, in partial remission: Secondary | ICD-10-CM | POA: Diagnosis not present

## 2023-11-15 NOTE — Progress Notes (Signed)
  THERAPIST PROGRESS NOTE  Session Time: 2:02 PM - 2:22 PM   Participation Level: Active  Behavioral Response: Well Groomed, Alert, Euthymic  Type of Therapy: Individual Therapy  Treatment Goals addressed: Active OP Depression  LTG: I want to quit thinking about all these things I keep thinking about. I need to learn to deal with my laryngectomy and losing my leg. I need to stop having these negative thoughts. It's all just overwhelming to me.  (Progressing)    Start:  08/16/23    Expected End:  08/14/24    Goal Note Reviewed 7/10/25I've been talking to my speech therapist about my laryngectomy and that's been helpful. I can call him anytime.   STG: I need to quit thinking negative. To improve thinking patterns AEB identifying and restructuring maladaptive patterns of thinking over the next 12 weeks.  (Progressing)   Goal Note Reviewed 11/15/23 I'm doing good at that. I think there's still some work to do but it's making me feel better.   STG: I want to deal with my depression. To reduce symptoms of depression AEB reduction in PHQ9 scores by using coping mechanisms over the next 12 weeks.  (Completed/Met)   Goal Note Reviewed 11/15/23 Scores have reduced to mild range  STG: Just being able to cope with my disability. That's really hard for me. To reduce the impact of disability AEB processing events and coming to a place of acceptance over the next 12 weeks.  (Progressing)   Goal Note Reviewed 11/15/23 - I'm doing better with that. Taking it day by day.  ProgressTowards Goals: Progressing  Interventions: Motivational Interviewing and Supportive  Summary: [name] is a 63 y.o. male who presents with a history of anxiety and depression. He appeared alert and oriented x5. He stated he is doing well. He reported his PCP added a medication that has worked wonders for him. [name] shared updates with his health and family. He noted progress on goals and minor areas for  continued improvement. He scored mild on depression screening and denied concerns for anxiety. He expressed a desire to discontinue therapy as he is doing well.   Therapist Response: Conducted session with [name]. Began session with check-in/update since previous session. Utilized empathetic and reflective listening. Used open-ended questions to facilitate discussion and summarized thoughts/feelings. Reviewed treatment plan with input from Pat on current strengths, needs, and progress towards goals. Administered PHQ9 screening. Discussed discharge and concluded session.  Suicidal/Homicidal: No  Plan: Successful discharge from OPT  Diagnosis: Recurrent major depressive disorder, in partial remission (HCC)  Collaboration of Care: Medication Management AEB chart review  Patient/Guardian was advised Release of Information must be obtained prior to any record release in order to collaborate their care with an outside provider. Patient/Guardian was advised if they have not already done so to contact the registration department to sign all necessary forms in order for us  to release information regarding their care.   Consent: Patient/Guardian gives verbal consent for treatment and assignment of benefits for services provided during this visit. Patient/Guardian expressed understanding and agreed to proceed.   [name], [facility] 11/15/2023

## 2023-11-20 ENCOUNTER — Other Ambulatory Visit: Payer: Self-pay | Admitting: Psychiatry

## 2023-11-20 DIAGNOSIS — F5101 Primary insomnia: Secondary | ICD-10-CM

## 2023-11-21 ENCOUNTER — Telehealth: Payer: Self-pay

## 2023-11-21 DIAGNOSIS — F5101 Primary insomnia: Secondary | ICD-10-CM

## 2023-11-21 DIAGNOSIS — C329 Malignant neoplasm of larynx, unspecified: Secondary | ICD-10-CM | POA: Diagnosis not present

## 2023-11-21 DIAGNOSIS — Z963 Presence of artificial larynx: Secondary | ICD-10-CM | POA: Diagnosis not present

## 2023-11-21 DIAGNOSIS — Z9002 Acquired absence of larynx: Secondary | ICD-10-CM | POA: Diagnosis not present

## 2023-11-21 MED ORDER — ZOLPIDEM TARTRATE 10 MG PO TABS: ORAL_TABLET | ORAL | 2 refills | Status: DC

## 2023-11-21 NOTE — Telephone Encounter (Signed)
 pt called states that he needs a refill on the ambien . pt was last seen on 5-21 next appt 8-21

## 2023-11-21 NOTE — Telephone Encounter (Signed)
I have sent Ambien to pharmacy. °

## 2023-11-22 DIAGNOSIS — Z430 Encounter for attention to tracheostomy: Secondary | ICD-10-CM | POA: Diagnosis not present

## 2023-11-22 NOTE — Telephone Encounter (Signed)
 left message that rx was sent to the pharmacy

## 2023-11-24 DIAGNOSIS — Z9002 Acquired absence of larynx: Secondary | ICD-10-CM | POA: Diagnosis not present

## 2023-11-24 DIAGNOSIS — Z89512 Acquired absence of left leg below knee: Secondary | ICD-10-CM | POA: Diagnosis not present

## 2023-11-24 DIAGNOSIS — I639 Cerebral infarction, unspecified: Secondary | ICD-10-CM | POA: Diagnosis not present

## 2023-11-24 DIAGNOSIS — R220 Localized swelling, mass and lump, head: Secondary | ICD-10-CM | POA: Diagnosis not present

## 2023-11-24 DIAGNOSIS — Z930 Tracheostomy status: Secondary | ICD-10-CM | POA: Diagnosis not present

## 2023-11-24 DIAGNOSIS — J9620 Acute and chronic respiratory failure, unspecified whether with hypoxia or hypercapnia: Secondary | ICD-10-CM | POA: Diagnosis not present

## 2023-11-25 DIAGNOSIS — I639 Cerebral infarction, unspecified: Secondary | ICD-10-CM | POA: Diagnosis not present

## 2023-11-25 DIAGNOSIS — R220 Localized swelling, mass and lump, head: Secondary | ICD-10-CM | POA: Diagnosis not present

## 2023-11-25 DIAGNOSIS — Z930 Tracheostomy status: Secondary | ICD-10-CM | POA: Diagnosis not present

## 2023-11-25 DIAGNOSIS — Z9002 Acquired absence of larynx: Secondary | ICD-10-CM | POA: Diagnosis not present

## 2023-11-25 DIAGNOSIS — J9620 Acute and chronic respiratory failure, unspecified whether with hypoxia or hypercapnia: Secondary | ICD-10-CM | POA: Diagnosis not present

## 2023-11-25 DIAGNOSIS — Z89512 Acquired absence of left leg below knee: Secondary | ICD-10-CM | POA: Diagnosis not present

## 2023-11-26 DIAGNOSIS — J386 Stenosis of larynx: Secondary | ICD-10-CM | POA: Diagnosis not present

## 2023-11-28 DIAGNOSIS — E1069 Type 1 diabetes mellitus with other specified complication: Secondary | ICD-10-CM | POA: Diagnosis not present

## 2023-12-04 DIAGNOSIS — Z89512 Acquired absence of left leg below knee: Secondary | ICD-10-CM | POA: Diagnosis not present

## 2023-12-07 ENCOUNTER — Other Ambulatory Visit: Payer: Self-pay | Admitting: Psychiatry

## 2023-12-07 DIAGNOSIS — F3341 Major depressive disorder, recurrent, in partial remission: Secondary | ICD-10-CM

## 2023-12-14 DIAGNOSIS — Z9002 Acquired absence of larynx: Secondary | ICD-10-CM | POA: Diagnosis not present

## 2023-12-14 DIAGNOSIS — Z930 Tracheostomy status: Secondary | ICD-10-CM | POA: Diagnosis not present

## 2023-12-14 DIAGNOSIS — E1029 Type 1 diabetes mellitus with other diabetic kidney complication: Secondary | ICD-10-CM | POA: Diagnosis not present

## 2023-12-19 DIAGNOSIS — M778 Other enthesopathies, not elsewhere classified: Secondary | ICD-10-CM | POA: Diagnosis not present

## 2023-12-19 DIAGNOSIS — G8929 Other chronic pain: Secondary | ICD-10-CM | POA: Diagnosis not present

## 2023-12-19 DIAGNOSIS — M7542 Impingement syndrome of left shoulder: Secondary | ICD-10-CM | POA: Diagnosis not present

## 2023-12-19 DIAGNOSIS — M7552 Bursitis of left shoulder: Secondary | ICD-10-CM | POA: Diagnosis not present

## 2023-12-19 DIAGNOSIS — M19012 Primary osteoarthritis, left shoulder: Secondary | ICD-10-CM | POA: Diagnosis not present

## 2023-12-24 DIAGNOSIS — E1069 Type 1 diabetes mellitus with other specified complication: Secondary | ICD-10-CM | POA: Diagnosis not present

## 2023-12-25 DIAGNOSIS — Z930 Tracheostomy status: Secondary | ICD-10-CM | POA: Diagnosis not present

## 2023-12-25 DIAGNOSIS — J9620 Acute and chronic respiratory failure, unspecified whether with hypoxia or hypercapnia: Secondary | ICD-10-CM | POA: Diagnosis not present

## 2023-12-25 DIAGNOSIS — Z89512 Acquired absence of left leg below knee: Secondary | ICD-10-CM | POA: Diagnosis not present

## 2023-12-25 DIAGNOSIS — I639 Cerebral infarction, unspecified: Secondary | ICD-10-CM | POA: Diagnosis not present

## 2023-12-25 DIAGNOSIS — R220 Localized swelling, mass and lump, head: Secondary | ICD-10-CM | POA: Diagnosis not present

## 2023-12-25 DIAGNOSIS — Z9002 Acquired absence of larynx: Secondary | ICD-10-CM | POA: Diagnosis not present

## 2023-12-26 DIAGNOSIS — Z89512 Acquired absence of left leg below knee: Secondary | ICD-10-CM | POA: Diagnosis not present

## 2023-12-26 DIAGNOSIS — Z9002 Acquired absence of larynx: Secondary | ICD-10-CM | POA: Diagnosis not present

## 2023-12-26 DIAGNOSIS — I639 Cerebral infarction, unspecified: Secondary | ICD-10-CM | POA: Diagnosis not present

## 2023-12-26 DIAGNOSIS — R220 Localized swelling, mass and lump, head: Secondary | ICD-10-CM | POA: Diagnosis not present

## 2023-12-26 DIAGNOSIS — Z930 Tracheostomy status: Secondary | ICD-10-CM | POA: Diagnosis not present

## 2023-12-26 DIAGNOSIS — J9620 Acute and chronic respiratory failure, unspecified whether with hypoxia or hypercapnia: Secondary | ICD-10-CM | POA: Diagnosis not present

## 2023-12-27 ENCOUNTER — Encounter: Payer: Self-pay | Admitting: Psychiatry

## 2023-12-27 ENCOUNTER — Telehealth (INDEPENDENT_AMBULATORY_CARE_PROVIDER_SITE_OTHER): Admitting: Psychiatry

## 2023-12-27 DIAGNOSIS — F411 Generalized anxiety disorder: Secondary | ICD-10-CM

## 2023-12-27 DIAGNOSIS — F5101 Primary insomnia: Secondary | ICD-10-CM

## 2023-12-27 DIAGNOSIS — F3341 Major depressive disorder, recurrent, in partial remission: Secondary | ICD-10-CM | POA: Diagnosis not present

## 2023-12-27 MED ORDER — ZALEPLON 10 MG PO CAPS: 10.0000 mg | ORAL_CAPSULE | Freq: Every evening | ORAL | 0 refills | Status: DC | PRN

## 2023-12-27 NOTE — Progress Notes (Signed)
 Virtual Visit via Video Note  I connected with [name] on 12/27/23 at  1:00 PM EDT by a video enabled telemedicine application and verified that I am speaking with the correct person using two identifiers.  Location Provider Location : [facility] Patient Location : Home  Participants: Patient , Provider   I discussed the limitations of evaluation and management by telemedicine and the availability of in person appointments. The patient expressed understanding and agreed to proceed.  I discussed the assessment and treatment plan with the patient. The patient was provided an opportunity to ask questions and all were answered. The patient agreed with the plan and demonstrated an understanding of the instructions.   The patient was advised to call back or seek an in-person evaluation if the symptoms worsen or if the condition fails to improve as anticipated.   BH MD OP Progress Note  12/27/2023 1:20 PM [name]  MRN:  [id number]  Chief Complaint:  Chief Complaint  Patient presents with   Follow-up   Depression   Anxiety   Insomnia   Medication Refill   Discussed the use of AI scribe software for clinical note transcription with the patient, who gave verbal consent to proceed.  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  He reports feeling good overall and describes improvement in his mood since the last visit, when he  experienced sadness related to physical restrictions. He denies current symptoms of significant anxiety or depression.  He continues to have ongoing difficulty with sleep onset, as he states he has trouble falling asleep but sleeps through the night once asleep. On average, he has 2 good nights of sleep per week and sometimes remains awake until early morning hours. He currently takes Ambien  for sleep and recently picked up a prescription, but notes persistent difficulty with sleep initiation.  He takes Lexapro  5 mg daily, which he started about a month ago after he initially started at 10 mg and reduced the dose because he did not feel right.  This was initiated by his primary care provider after he contacted them with worsening mood symptoms.  He states that he felt better after reducing the dose. He also takes olanzapine  5 mg at bedtime, bupropion  300 mg in the morning, and clonazepam  1 mg as needed.  Denies any suicidality, homicidality or perceptual disturbances.  Lives at home. Brother visits and stays for about 3 days at a time.  Continues to have good support system from his parents.  Reads as a regular activity. Goes out with friends or family.  He is awaiting fitting for prosthetic leg.   Visit Diagnosis:    ICD-10-CM   1. MDD (major depressive disorder), recurrent, in partial remission (HCC)  F33.41     2. GAD (generalized anxiety disorder)  F41.1     3. Primary insomnia  F51.01 zaleplon  (SONATA ) 10 MG capsule      Past Psychiatric History: I have reviewed past psychiatric history from progress note on 01/15/2019.  Past trials of fluoxetine, Rexulti , Paxil,  Celexa , Lexapro , Wellbutrin , Zyprexa , Seroquel , Depakote , Xanax , Abilify . Completed TMS-twice in 2021, 2024. History of ECT-did not tolerate it. Multiple inpatient behavioral health admissions in the past-2020. Possible remote suicide attempt several years ago when he tried to overdose on bug spray as well as had plans to jump  in front of a train although did not go through with it.  History of overdose on opioids in 2014 however reports it was not a suicide attempt and that he was trying to get high and it was accidental.  Past Medical History:  Past Medical History:  Diagnosis Date   Depression    Diabetes (HCC)    Insulin  Pump   Diabetes mellitus type I (HCC)    Diabetes mellitus without complication (HCC)    GERD (gastroesophageal reflux disease)    H/O laryngectomy    Heel bone fracture    History of embolic stroke    Hyperlipidemia    Hypertension    Radicular pain of right lower extremity    Stroke (HCC)    Suicide attempt (HCC) 2014   damaged larynx - tracheostomy   Thyroid  disease     Past Surgical History:  Procedure Laterality Date   COLONOSCOPY WITH PROPOFOL  N/A 05/15/2018   Procedure: COLONOSCOPY WITH PROPOFOL ;  Surgeon: [name], Ladell POUR, MD;  Location: [facility] ENDOSCOPY;  Service: Gastroenterology;  Laterality: N/A;   ESOPHAGOGASTRODUODENOSCOPY N/A 05/15/2018   Procedure: ESOPHAGOGASTRODUODENOSCOPY (EGD);  Surgeon: [name], Ladell POUR, MD;  Location: [facility] ENDOSCOPY;  Service: Gastroenterology;  Laterality: N/A;   FRACTURE SURGERY     Heel bone reconstruction Left    HERNIA REPAIR  02/2011   Umbilical hernia repair    LARYNGECTOMY     LOWER EXTREMITY ANGIOGRAPHY Left 01/31/2021   Procedure: LOWER EXTREMITY ANGIOGRAPHY;  Surgeon: Marea Selinda RAMAN, MD;  Location: [facility] INVASIVE CV LAB;  Service: Cardiovascular;  Laterality: Left;   LOWER EXTREMITY ANGIOGRAPHY Right 02/14/2021   Procedure: LOWER EXTREMITY ANGIOGRAPHY;  Surgeon: Marea Selinda RAMAN, MD;  Location: [facility] INVASIVE CV LAB;  Service: Cardiovascular;  Laterality: Right;   NECK SURGERY     fusion   SPINE SURGERY     TRACHEOSTOMY  2014   from SI attempt    Family Psychiatric History: I have reviewed family psychiatric history from progress note on 01/15/2019.  Family History:  Family History  Problem Relation Age of Onset   Osteoporosis Mother     Diabetes Mother    Hypertension Father    Mental illness Neg Hx     Social History: I have reviewed social history from progress note on 01/15/2019. Social History   Socioeconomic History   Marital status: Widowed    Spouse name: [name]   Number of children: Not on file   Years of education: Not on file   Highest education level: Not on file  Occupational History   Not on file  Tobacco Use   Smoking status: Former    Current packs/day: 0.00    Types: Cigarettes    Quit date: 04/09/2013    Years since quitting: 10.7   Smokeless tobacco: Never  Vaping Use   Vaping status: Never Used  Substance and Sexual Activity   Alcohol use: Yes    Alcohol/week: 2.0 standard drinks of alcohol    Types: 2 Shots of liquor per week   Drug use: No    Comment: Pt denied; UDS not available   Sexual activity: Yes    Partners: Female    Birth control/protection: Condom  Other Topics  Concern   Not on file  Social History Narrative   Not on file   Social Drivers of Health   Financial Resource Strain: Low Risk  (12/19/2023)   Received from [facility]   Overall Financial Resource Strain (CARDIA)    Difficulty of Paying Living Expenses: Not hard at all  Food Insecurity: No Food Insecurity (12/19/2023)   Received from [facility]   Hunger Vital Sign    Within the past 12 months, you worried that your food would run out before you got the money to buy more.: Never true    Within the past 12 months, the food you bought just didn't last and you didn't have money to get more.: Never true  Transportation Needs: No Transportation Needs (12/19/2023)   Received from [facility] - Transportation    In the past 12 months, has lack of transportation kept you from medical appointments or from getting medications?: No    Lack of Transportation (Non-Medical): No  Physical Activity: Sufficiently Active (07/12/2023)   Exercise Vital Sign    Days  of Exercise per Week: 7 days    Minutes of Exercise per Session: 30 min  Stress: Stress Concern Present (07/12/2023)   [employer] of Occupational Health - Occupational Stress Questionnaire    Feeling of Stress : Very much  Social Connections: Socially Isolated (07/12/2023)   Social Connection and Isolation Panel    Frequency of Communication with Friends and Family: More than three times a week    Frequency of Social Gatherings with Friends and Family: Never    Attends Religious Services: Never    Database administrator or Organizations: No    Attends Banker Meetings: Never    Marital Status: Widowed    Allergies:  Allergies  Allergen Reactions   Buspar [Buspirone]     Makes the patient flip out   Depakote  [Valproic Acid ]     Causes excessive drowsiness   Pregabalin Other (See Comments)    Gum Bleeding   Clopidogrel Rash   Gabapentin Itching and Rash    Metabolic Disorder Labs: Lab Results  Component Value Date   HGBA1C 7.4 (H) 10/28/2022   MPG 165.68 10/28/2022   MPG 177 02/14/2021   No results found for: PROLACTIN Lab Results  Component Value Date   CHOL 161 09/15/2016   TRIG 190 (H) 09/15/2016   HDL 41 09/15/2016   CHOLHDL 3.9 09/15/2016   VLDL 38 09/15/2016   LDLCALC 82 09/15/2016   Lab Results  Component Value Date   TSH 1.952 09/15/2016    Therapeutic Level Labs: No results found for: LITHIUM Lab Results  Component Value Date   VALPROATE 19 (L) 09/12/2016   No results found for: CBMZ  Current Medications: Current Outpatient Medications  Medication Sig Dispense Refill   escitalopram  (LEXAPRO ) 5 MG tablet Take 5 mg by mouth daily.     zaleplon  (SONATA ) 10 MG capsule Take 1 capsule (10 mg total) by mouth at bedtime as needed for up to 15 days for sleep. Stop Zolpidem  ( Ambien ) 15 capsule 0   amLODipine  (NORVASC ) 5 MG tablet Take 1 tablet (5 mg total) by mouth daily. 30 tablet 1   apixaban  (ELIQUIS ) 5 MG TABS tablet Take 1  tablet (5 mg total) by mouth 2 (two) times daily. 60 tablet 5   buPROPion  (WELLBUTRIN  XL) 300 MG 24 hr tablet TAKE 1 TABLET(300 MG) BY MOUTH DAILY WITH BREAKFAST  90 tablet 1   clonazePAM  (KLONOPIN ) 1 MG tablet TAKE 1 TABLET (1 MG TOTAL) BY MOUTH DAILY AS NEEDED FOR ANXIETY. PLEASE LIMIT USE 30 tablet 5   Continuous Glucose Sensor (DEXCOM G7 SENSOR) MISC Use 1 each every 10 (ten) days     empagliflozin  (JARDIANCE ) 25 MG TABS tablet Take 25 mg by mouth daily. (Patient taking differently: Take 12.5 mg by mouth daily.) 30 tablet 1   HUMALOG 100 UNIT/ML injection SMARTSIG:0-120 Unit(s) SUB-Q Daily     insulin  aspart (NOVOLOG ) 100 UNIT/ML injection INJECT UP TO 120 UNITS UNDER THE SKIN AS DIRECTED DAILY VIA INSULIN  PUMP     levothyroxine  (SYNTHROID ) 75 MCG tablet TAKE 1 TABLET BY MOUTH DAILY AT 6 AM 90 tablet 0   metoprolol  succinate (TOPROL -XL) 25 MG 24 hr tablet TAKE 1 TABLET(25 MG) BY MOUTH EVERY DAY     mometasone  (ELOCON ) 0.1 % cream Apply to rash on arms and chest twice daily until improved. 45 g 1   OLANZapine  (ZYPREXA ) 5 MG tablet TAKE 1 TABLET BY MOUTH EVERYDAY AT BEDTIME 90 tablet 1   ondansetron  (ZOFRAN -ODT) 4 MG disintegrating tablet Take 4 mg by mouth every 8 (eight) hours as needed for nausea or vomiting.     pantoprazole  (PROTONIX ) 40 MG tablet TAKE 1 TABLET(40 MG) BY MOUTH TWICE DAILY     rosuvastatin  (CRESTOR ) 20 MG tablet Take 20 mg by mouth at bedtime.     sodium hypochlorite (DAKIN'S 1/4 STRENGTH) 0.125 % SOLN      telmisartan (MICARDIS) 80 MG tablet Take 80 mg by mouth daily.     No current facility-administered medications for this visit.     Musculoskeletal: Strength & Muscle Tone: UTA Gait & Station: Seated Patient leans: N/A  Psychiatric Specialty Exam: Review of Systems  Psychiatric/Behavioral:  Positive for sleep disturbance. The patient is nervous/anxious.     There were no vitals taken for this visit.There is no height or weight on file to calculate BMI.  General  Appearance: Casual  Eye Contact:  Fair  Speech:  Normal Rate  Volume:  baseline , S/P Trach  Mood:  Anxious  Affect:  Congruent  Thought Process:  Goal Directed and Descriptions of Associations: Intact  Orientation:  Full (Time, Place, and Person)  Thought Content: Logical   Suicidal Thoughts:  No  Homicidal Thoughts:  No  Memory:  Immediate;   Fair Recent;   Fair Remote;   Fair  Judgement:  Fair  Insight:  Fair  Psychomotor Activity:  Normal  Concentration:  Concentration: Fair and Attention Span: Fair  Recall:  [employer] of Knowledge: Fair  Language: Fair  Akathisia:  No  Handed:  Right  AIMS (if indicated): not done  Assets:  Communication Skills Desire for Improvement Housing Social Support  ADL's:  Intact  Cognition: WNL  Sleep:  Poor   Screenings: AIMS    Flowsheet Row Office Visit from 09/26/2023 in [facility] Office Visit from 12/28/2022 in [facility] Office Visit from 04/05/2022 in [facility] Video Visit from 10/31/2021 in [facility] Office Visit from 08/30/2021 in [facility]  AIMS Total Score 0 0 0 0 0   AUDIT    Flowsheet Row Admission (Discharged) from 01/22/2019 in [facility] Admission (Discharged) from 12/31/2018 in [facility] BE[facility]AL MEDICINE Admission (Discharged) from 09/14/2016 in [facility] IN[facility]IENT BEH[facility]AL MEDICINE  Alcohol Use Disorder  Identification Test Final Score (AUDIT) 0 0 1   ECT-MADRS    Flowsheet Row Admission (Discharged) from 01/22/2019 in [facility] INPATIENT BEHAVIORAL MEDICINE  MADRS Total Score 27   GAD-7    Flowsheet Row Office Visit from 07/23/2023 in [facility] Counselor from 07/12/2023 in [facility] Office Visit from  03/20/2023 in [facility] Video Visit from 08/01/2022 in [facility] Office Visit from 06/06/2022 in [facility]  Total GAD-7 Score 2 17 8 6 3    Mini-Mental    Flowsheet Row Admission (Discharged) from 01/22/2019 in [facility] INPATIENT BEHAVIORAL MEDICINE  Total Score (max 30 points ) 30   PHQ2-9    Flowsheet Row Counselor from 11/15/2023 in [facility] Office Visit from 09/26/2023 in [facility] Office Visit from 07/23/2023 in [facility] Counselor from 07/12/2023 in [facility] Office Visit from 03/20/2023 in [facility]  PHQ-2 Total Score 1 2 2 3 2   PHQ-9 Total Score 6 6 6 16 7    Flowsheet Row Video Visit from 12/27/2023 in [facility] Office Visit from 09/26/2023 in [facility] Office Visit from 07/23/2023 in [facility]  C-SSRS RISK CATEGORY No Risk Moderate Risk Moderate Risk     Assessment and Plan: [name] is a 63 year old male, has a history of multiple medical problems including depression, anxiety, insomnia was evaluated by telemedicine today.  Discussed assessment and plan as noted below.  Major depression in partial remission Recently went through an episode of depression and was initiated on Lexapro  by primary care provider currently on 5 mg daily.  Currently reports depression symptoms as improving although continues to struggle with sleep. Continue Olanzapine  5 mg daily Continue Wellbutrin  300 mg daily Continue Lexapro  5 mg daily Continue Clonazepam  1 mg daily as needed Reviewed [place] PMP AWARxE Continue CBT with Ms. Veva  Generalized  anxiety disorder-improving Currently reports anxiety symptoms as better. Continue Lexapro  5 mg daily Continue Clonazepam  1 mg daily Continue CBT  Primary insomnia-unstable Currently reports sleep is affected and has more bad days than good days.  Ambien  is not beneficial. Discontinue Ambien  for lack of benefit. Start Sonata  10 mg at bedtime Provided medication education. Reviewed [place] PMP AWARxE Continue sleep hygiene techniques  Follow-up Follow-up in clinic in 4 weeks or sooner if needed.    Collaboration of Care: Collaboration of Care: Encouraged to follow-up with counselor/therapist AEB encouraged to continue CBT  Patient/Guardian was advised Release of Information must be obtained prior to any record release in order to collaborate their care with an outside provider. Patient/Guardian was advised if they have not already done so to contact the registration department to sign all necessary forms in order for us  to release information regarding their care.   Consent: Patient/Guardian gives verbal consent for treatment and assignment of benefits for services provided during this visit. Patient/Guardian expressed understanding and agreed to proceed.   This note was generated in part or whole with voice recognition software. Voice recognition is usually quite accurate but there are transcription errors that can and very often do occur. I apologize for any typographical errors that were not detected and corrected.    Reshanda Lewey, MD 12/28/2023, 7:04 AM

## 2023-12-27 NOTE — Patient Instructions (Signed)
Zaleplon Capsules What is this medication? ZALEPLON (ZAL e plon) treats insomnia. It helps you go to sleep faster. It is often used for a short time. This medicine may be used for other purposes; ask your health care provider or pharmacist if you have questions. COMMON BRAND NAME(S): Sonata What should I tell my care team before I take this medication? They need to know if you have any of these conditions: Depression Liver disease Lung or breathing disease Substance use disorder Suicidal thoughts, plans, or attempt by you or a family member Unusual sleep behaviors or activities you do not remember An unusual or allergic reaction to zaleplon, other medications, foods, dyes, or preservatives Pregnant or trying to get pregnant Breastfeeding How should I use this medication? Take this medication by mouth with water. Take it as directed on the label. Do not use it more often than directed. There may be unused or extra doses in the bottle after you finish your treatment. Talk to your care team if you have questions about your dose. A special MedGuide will be given to you by the pharmacist with each prescription and refill. Be sure to read this information carefully each time. Talk to your care team about the use of this medication in children. Special care may be needed. Overdosage: If you think you have taken too much of this medicine contact a poison control center or emergency room at once. NOTE: This medicine is only for you. Do not share this medicine with others. What if I miss a dose? This does not apply. This medication should only be taken immediately before going to sleep. Do not take double or extra doses. What may interact with this medication? Barbiturate medications for inducing sleep or treating seizures Carbamazepine Certain medications for allergies, such as azatadine, clemastine, diphenhydramine Certain medications for mental health conditions Certain medications for  pain Cimetidine Erythromycin Medications for fungal infections, such as ketoconazole, fluconazole, or itraconazole Other medications given for sleep Phenytoin Rifampin This list may not describe all possible interactions. Give your health care provider a list of all the medicines, herbs, non-prescription drugs, or dietary supplements you use. Also tell them if you smoke, drink alcohol, or use illegal drugs. Some items may interact with your medicine. What should I watch for while using this medication? Visit your care team for regular checks on your progress. Keep a regular sleep schedule by going to bed at about the same time each night. Avoid caffeine-containing drinks in the evening hours. When sleep medications are used every night for more than a few weeks, they may stop working. Talk to your care team if you still have trouble sleeping. You may do unusual sleep behaviors or activities you do not remember the day after taking this medication. Activities include driving, making or eating food, talking on the phone, sexual activity, or sleep walking. Stop taking this medication and call your care team right away if you find out you have done activities like this. Plan to go to bed and stay in bed for a full night (7 to 8 hours) after you take this medication. You may still be drowsy the morning after taking this medication. This medication may affect your coordination, reaction time, or judgment. Do not drive or operate machinery until you know how this medication affects you. Sit up or stand slowly to reduce the risk of dizzy or fainting spells. If you or your family notice any changes in your behavior, such as new or worsening depression, thoughts of  harming yourself, anxiety, other unusual or disturbing thoughts, or memory loss, call your care team right away. After you stop taking this medication, you may have trouble falling asleep. This is called rebound insomnia. This problem usually goes away  on its own after 1 or 2 nights. What side effects may I notice from receiving this medication? Side effects that you should report to your care team as soon as possible: Allergic reactions--skin rash, itching, hives, swelling of the face, lips, tongue, or throat Change in vision such as blurry vision, seeing halos around lights, vision loss CNS depression--slow or shallow breathing, shortness of breath, feeling faint, dizziness, confusion, difficulty staying awake Mood and behavior changes--anxiety, nervousness, confusion, hallucinations, irritability, hostility, thoughts of suicide or self-harm, worsening mood, feelings of depression Unusual sleep behaviors or activities you do not remember such as driving, eating, or sexual activity Side effects that usually do not require medical attention (report to your care team if they continue or are bothersome): Diarrhea Dizziness Drowsiness the day after use Pain, tingling, or numbness in the hands or feet This list may not describe all possible side effects. Call your doctor for medical advice about side effects. You may report side effects to FDA at 1-800-FDA-1088. Where should I keep my medication? Keep out of the reach of children and pets. This medication can be abused. Keep your medication in a safe place to protect it from theft. Do not share this medication with anyone. Selling or giving away this medication is dangerous and against the law. Store at room temperature between 20 and 25 degrees C (68 and 77 degrees F). Protect from light. This medication may cause accidental overdose and death if taken by other adults, children, or pets. Mix any unused medication with a substance like cat litter or coffee grounds. Then throw the medication away in a sealed container like a sealed bag or a coffee can with a lid. Do not use the medication after the expiration date. NOTE: This sheet is a summary. It may not cover all possible information. If you have  questions about this medicine, talk to your doctor, pharmacist, or health care provider.  2024 Elsevier/Gold Standard (2022-04-23 00:00:00)

## 2023-12-28 DIAGNOSIS — E1069 Type 1 diabetes mellitus with other specified complication: Secondary | ICD-10-CM | POA: Diagnosis not present

## 2024-01-08 DIAGNOSIS — E1029 Type 1 diabetes mellitus with other diabetic kidney complication: Secondary | ICD-10-CM | POA: Diagnosis not present

## 2024-01-08 DIAGNOSIS — E785 Hyperlipidemia, unspecified: Secondary | ICD-10-CM | POA: Diagnosis not present

## 2024-01-08 DIAGNOSIS — E039 Hypothyroidism, unspecified: Secondary | ICD-10-CM | POA: Diagnosis not present

## 2024-01-08 DIAGNOSIS — R809 Proteinuria, unspecified: Secondary | ICD-10-CM | POA: Diagnosis not present

## 2024-01-08 DIAGNOSIS — I152 Hypertension secondary to endocrine disorders: Secondary | ICD-10-CM | POA: Diagnosis not present

## 2024-01-08 DIAGNOSIS — E1069 Type 1 diabetes mellitus with other specified complication: Secondary | ICD-10-CM | POA: Diagnosis not present

## 2024-01-08 NOTE — Progress Notes (Addendum)
 HPI:  [name] is a 63 y.o. male who presents for follow up type 1 diabetes.  He was diagnosed around 63 at the age of 36.  I see his mother as well for type 1 diabetes Gavino Fouch).  His history is complicated by an embolic stroke in 2014.  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  I last saw him in 2/25.  He has done well since that time. He says he stopped taking Jardiance  since it was upsetting his stomach.   He is on Humalog via the T slim control IQ pump. He stopped Jardiance  as above.  Basal rates Midnight = 0.9 8 AM = 0.8 8 PM = 0.7 TDD basal: 19.6 units   Bolus settings I/C: 3.5 ISF: 30 Target Glucose: 110 Active insulin  time: 5 hours   Total Daily dose approximately 80-100 units.   His pump and sensor were downloaded and reviewed.  His 14 day average was 158 with SD of 49.  His TIR is 70%.  He tends to not bolus with meals.  He tries to avoid concentrated carbohydrate.  He does not do any specific exercise as he is limited. He says he does not snack at night.  He is down 9 pounds from last visit.  He is concerned about his diabetes.     ROS:  No chest pain.  No shortness of breath.    Medical History: Past Medical History:  Diagnosis Date  . Acute thromboembolism of deep veins of lower extremity (CMS/HHS-HCC) 09/15/2013  . COPD (chronic obstructive pulmonary disease) (CMS/HHS-HCC)   . Depression    Long standing depression.  . Diabetes mellitus type 2, uncomplicated (CMS/HHS-HCC)   . Heel bone fracture   . Hyperlipidemia   . Hypertension   . Radicular pain of right lower extremity   . Stroke (CMS/HHS-HCC)     Surgical History: Past Surgical History:  Procedure Laterality Date  . COLONOSCOPY  05/15/2018   Fair colon prep/Otherwise normal colon/Repeat 72yrs/TKT  . EGD  05/15/2018   Chronic inflammation/No Repeat/TKT  . BELOW KNEE AMPUTATION Left   . heel bone reconstruction  Left   . Neck  surgery     Fusion  . SPINE SURGERY    . TRACHEOSTOMY EMERGENT PEDIATRIC OVER TWO YEARS    . Umbilical hernia repair 10/12    . UNLISTED PROCEDURE LARYNX      Social History:  reports that he quit smoking about 10 years ago. His smoking use included cigarettes. He has been exposed to tobacco smoke. He has never used smokeless tobacco. He reports that he does not drink alcohol and does not use drugs.  He is married.   Family History: family history includes Diabetes type I in his mother; High blood pressure (Hypertension) in his father; Osteoporosis (Thinning of bones) in his mother.  Medications: Current Outpatient Medications  Medication Sig Dispense Refill  . amLODIPine  (NORVASC ) 5 MG tablet TAKE 1 TABLET BY MOUTH EVERY DAY 90 tablet 1  . aspirin  81 MG EC tablet Take 81 mg by mouth once daily    . blood glucose diagnostic (ONETOUCH VERIO TEST STRIPS) test strip 1 each (1 strip total) 3 (three) times daily Use as instructed. 100 each 11  . blood-glucose sensor (DEXCOM G7 SENSOR) Devi 1 each every 10 (ten) days    . buPROPion  (WELLBUTRIN  XL) 300 MG XL tablet TAKE 1 TABLET(300 MG) BY MOUTH DAILY WITH BREAKFAST    .  chlorthalidone  25 MG tablet TAKE 1 TABLET BY MOUTH EVERY DAY 90 tablet 0  . clindamycin-benzoyl peroxide (BENZACLIN) 1-5 % topical gel APPLY TOPICALLY TWICE A DAY 50 g 0  . clonazePAM  (KLONOPIN ) 1 MG tablet Take 1 mg by mouth at bedtime    . duke's magic mouthwash with lidocaine 1:1 Swish and spit 5 mLs 4 (four) times daily 200 mL 0  . ELIQUIS  5 mg tablet TAKE 1 TABLET BY MOUTH EVERY 12 HOURS 180 tablet 1  . empagliflozin  (JARDIANCE ) 25 mg tablet TAKE 1 TABLET BY MOUTH EVERY DAY 90 tablet 4  . escitalopram  oxalate (LEXAPRO ) 10 MG tablet Take 1 tablet (10 mg total) by mouth once daily for 90 days 90 tablet 0  . FREESTYLE PRECISION NEO STRIPS test strip TEST BLOOD SUGAR 7 TIMES AS DIRECTED 300 strip 3  . insulin  LISPRO (HUMALOG U-100 INSULIN ) injection (concentration 100  units/mL) ADMINISTER UP TO 120 UNITS IN PUMP DAILY AS DIRECTED 40 mL 12  . insulin  NPH (HUMULIN N PEN) pen injector (concentration 100 units/mL) Inject 20 Units subcutaneously 2 (two) times daily before meals    . levothyroxine  (SYNTHROID ) 75 MCG tablet TAKE 1 TABLET BY MOUTH EVERY DAY 30-60 MINUTES BEFORE BREAKFAST ON AN EMPTY STOMACH AND FULL GLASS OF WATER 90 tablet 3  . lurasidone  (LATUDA ) 40 mg tablet TAKE 1 TABLET (40 MG TOTAL) BY MOUTH DAILY WITH SUPPER.    . methocarbamoL (ROBAXIN) 500 MG tablet Take 500 mg by mouth as needed    . metoprolol  SUCCinate (TOPROL -XL) 25 MG XL tablet TAKE 1 TABLET BY MOUTH EVERY DAY 90 tablet 1  . multivitamin with iron-minerals (THERA-M) 9 mg iron-400 mcg tablet Take 1 tablet by mouth once daily    . nortriptyline  (PAMELOR ) 25 MG capsule Take 1 capsule by mouth at bedtime    . OLANZapine  (ZYPREXA ) 5 MG tablet Take 5 mg by mouth at bedtime    . pantoprazole  (PROTONIX ) 40 MG DR tablet TAKE 1 TABLET BY MOUTH TWICE A DAY 180 tablet 1  . pen needle, diabetic (BD ULTRA-FINE MINI PEN NEEDLE) 31 gauge x 3/16 needle USE WITH INSULIN  UP TO 4 TIMES/DAY AS NEEDED.    . rosuvastatin  (CRESTOR ) 20 MG tablet take 1 tablet by mouth every day 90 tablet 3  . spironolactone (ALDACTONE) 25 MG tablet Take 0.5 tablets by mouth once daily    . telmisartan (MICARDIS) 80 MG tablet TAKE 1 TABLET (80 MG TOTAL) BY MOUTH ONCE DAILY. 90 tablet 1  . venlafaxine  (EFFEXOR -XR) 37.5 MG XR capsule Take 1 capsule by mouth daily with breakfast    . zolpidem  (AMBIEN ) 10 mg tablet Take 1 tablet by mouth at bedtime     No current facility-administered medications for this visit.    Allergies: Allergies  Allergen Reactions  . Buspirone Other (See Comments)    Makes the patient flip out  . Gabapentin Itching and Rash  . Lyrica [Pregabalin] Swelling    Mouth/ gums swelling and bleeding  . Plavix [Clopidogrel] Rash  . Valproic Acid  Other (See Comments)    Causes excessive drowsiness     Physical Exam: There were no vitals filed for this visit.    There is no height or weight on file to calculate BMI. Gen:  WDWN in NAD    Physical exam otherwise deferred due to coronavirus precautions.   Labs: 12/26/2016: A1c = 7.9.  K/Cr/Ca = 4.3/1.3/9.1.  Microalbumin = 35.3.  LFTs normal.  TSH = 3.801.  Vitamin D  = 35.5.  06/28/2017: A1c = 8.0 01/10/2018: A1c = 8.8.  K/Cr/Ca = 4.3/1.3/9.1, eGFR = 57.  Cholesterol = 162/132/49.4/86.  LFTs normal.  TSH = 3.37. 01/23/2018: A1c = 8.7. 04/18/2018: A1c = 8.3 07/11/2018: A1c = 7.9.  K/Cr/Ca = 4.4/1.4/9.2, eGFR = 53.  Cholesterol = 148/138/42.9/78.  LFTs normal.  TSH = 4.265 01/16/2019: A1c = 8.0.  K/Cr/Ca = 3.8/1.8/9.5.  Chol. = 219/253/48/120.  LFTs nl, except alk. phos. 135.  TSH = 7.409.   03/25/2019: K/Cr/Ca = 4.1/1.6/9.6.  TSH = 1.674, FT4 = 0.88.  B12 = 305.   06/04/2019: A1c = 6.8.  K/Cr/Ca=4.3/1.4/9.9.  MA=25.6.  Chol=202/172/54.1/114.  LFTs nl except alk phos=132. 09/03/2019: A1c = 7.2 12/09/2019: A1c = 7.1.  K/Cr/Ca = 4.8/1.5/9.4. Cholesterol (nonfasting) = 154/235/44.7/62. LFTs normal except alk phos = 111. TSH = 1.687. 04/21/2020: A1c = 7.3  09/09/2020: A1c = 7.9. 01/12/2021: A1c = 7.8.  K/Cr/Ca= 5.7/1.8/9.8. 01/14/2021:  ABI's severely abn. Left =0.28, Right=1.73 (normal 0.9-1.25, mild 0.75-0.9, moderate 0.5-0.74, severe <0.5). 01/31/2021:  Left lower extremity angiography, multiple occlusions found.  Ended up having left popliteal artery thrombectomy with angioplasty of left popliteal artery, left tibial peroneal trunk and left anterior tibial artery. 02/14/2021: A1c = 7.8 02/22/2021:  K/Cr/Ca= 4.5/1.4/8.9.  Chol=157/338/42/47.  LFTs with AST=40, Alk phos=157.  05/13/2021:  K/Cr/Ca = 4.1/1.05/9.0. 05/19/2021: A1c = 8.8 08/29/2021:  K/Cr/Ca = 4.7/1.1/9.4.  Chol = 175/239/50.2/77.  LFTs nl except for alk phos = 133.  11/17/2021:  A1c = 7.7.  TSH=1.765 03/02/2022:  K/Cr/Ca = 4.5/1.4/9.3.  MA = 81.8.  Chol = 163/333/44.1/52.  LFTs nl except  for alk phos = 122.  03/23/2022:  A1c = 7.5  08/10/2022:  A1c = 7.8 10/28/2022: A1c = 7.4.  K/Cr/Ca = 4.1/1.8/8.6, eGFR = 43.  Vitamin D  = 30.26 04/03/2023: A1c = 7.3 04/09/2023: TSH = 0.853 04/26/2023:  K/Cr/Ca = 4.5/0.97/9.5.  LFTs nl except for alk phos = 124.  05/21/2023: K/Cr/Ca= 4.1/1.44/9.2 10/09/2023: K/Cr/Ca = 4.3/1.3/9.4.  Chol = 183/268/47.2/82.  LFTs nl except for alk phos = 107. 01/08/2024: A1c = 7.5.  Assessment/Plan: 1.  Type 1 diabetes.  His A1c today is 7.5.  Based on review of his CGM, I will make no changes to his pump settings.  I gave him instructions on how to bolus.  I told him to download the T connect app to make bolusing easier. I also put his pump on sleep mode over night.  I encouraged him to bolus ahead of meals and better lifestyle modifications.  2.  Hypertension/CKD/microalbuminuria associated diabetes.  His blood pressure today is good on Hyzaar and amlodipine .  3.  Hyperlipidemia associated with diabetes.  He is currently on rosuvastatin  20 mg daily.  LDL looked great in 6/25, but triglycerides were elevated.  Hopefully these will improve with better glucose control.    4.  Hypothyroidism.  TSH was normal in 12/24 on 75 mcg of levothyroxine  daily.   5.  Retinopathy. He has a hx of background retinopathy but no treatment was needed.   We last got a copy of a note from the eye doctor [facility]) on 09/24/23. He is to f/u in 5/26.   6.  History of CVA.  He had an embolic CVA in 2012.  He does not have any residual deficits, but does have a trach as a result.  7.  LLE amputation.  I examined his feet in 9/22 and had a difficult time palpating pulses.  His left foot was also discolored.  I ordered a ankle-brachial indexes on him and they returned abnormal.  I referred him to [facility].  He had a left lower extremity angiography in 10/22 and multiple occlusions were found.  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  He was previously admitted with a stump infection.  8.  Depression/suidical ideation.  Per his pcp and mental health provider.  He previously said he had TMS treatment and it helped.   8.  Nausea.  I encouraged him to f/u with his GI provider.     9.  Prophylaxis.  We last got a copy from the eye doctor in 5/25 as above.   10.  He will return in 4 months.   03/19/24:  Pts mother called as his sugar is high.  Pump broke yesterday and replacement won't arrive until tomorrow.  Told to take 20 units long acting daily while awaiting pump and use humalog for carbs and high sugars.  03/28/24:  Call rec'd from [facility].  Sounds like in with SI.  Sounds like they are giving him only ssi.  Told needs basal and meal coverage.  Recommended they get a medicine c/s.  This note is partially prepared by Earla Daria Messier, Scribe, in the presence of and acting as the scribe of Dr. Debby Breaker , MD.    [facility], MD

## 2024-01-10 ENCOUNTER — Telehealth: Payer: Self-pay

## 2024-01-10 DIAGNOSIS — F5101 Primary insomnia: Secondary | ICD-10-CM

## 2024-01-10 NOTE — Telephone Encounter (Signed)
 pt left a message that he needs a refill on the zaleplon . pt was last seen on 8-21 next appt 9-30

## 2024-01-11 MED ORDER — ZALEPLON 10 MG PO CAPS: 10.0000 mg | ORAL_CAPSULE | Freq: Every evening | ORAL | 0 refills | Status: DC | PRN

## 2024-01-11 NOTE — Telephone Encounter (Signed)
 I have sent zaleplon  to pharmacy as requested.

## 2024-01-14 DIAGNOSIS — Z9002 Acquired absence of larynx: Secondary | ICD-10-CM | POA: Diagnosis not present

## 2024-01-14 DIAGNOSIS — Z930 Tracheostomy status: Secondary | ICD-10-CM | POA: Diagnosis not present

## 2024-01-21 DIAGNOSIS — R972 Elevated prostate specific antigen [PSA]: Secondary | ICD-10-CM | POA: Diagnosis not present

## 2024-01-21 DIAGNOSIS — F331 Major depressive disorder, recurrent, moderate: Secondary | ICD-10-CM | POA: Diagnosis not present

## 2024-01-21 DIAGNOSIS — Z89512 Acquired absence of left leg below knee: Secondary | ICD-10-CM | POA: Diagnosis not present

## 2024-01-24 DIAGNOSIS — Z430 Encounter for attention to tracheostomy: Secondary | ICD-10-CM | POA: Diagnosis not present

## 2024-01-25 DIAGNOSIS — Z930 Tracheostomy status: Secondary | ICD-10-CM | POA: Diagnosis not present

## 2024-01-25 DIAGNOSIS — I639 Cerebral infarction, unspecified: Secondary | ICD-10-CM | POA: Diagnosis not present

## 2024-01-25 DIAGNOSIS — J9620 Acute and chronic respiratory failure, unspecified whether with hypoxia or hypercapnia: Secondary | ICD-10-CM | POA: Diagnosis not present

## 2024-01-25 DIAGNOSIS — Z9002 Acquired absence of larynx: Secondary | ICD-10-CM | POA: Diagnosis not present

## 2024-01-25 DIAGNOSIS — R220 Localized swelling, mass and lump, head: Secondary | ICD-10-CM | POA: Diagnosis not present

## 2024-01-25 DIAGNOSIS — Z89512 Acquired absence of left leg below knee: Secondary | ICD-10-CM | POA: Diagnosis not present

## 2024-01-26 DIAGNOSIS — R220 Localized swelling, mass and lump, head: Secondary | ICD-10-CM | POA: Diagnosis not present

## 2024-01-26 DIAGNOSIS — Z9002 Acquired absence of larynx: Secondary | ICD-10-CM | POA: Diagnosis not present

## 2024-01-26 DIAGNOSIS — I639 Cerebral infarction, unspecified: Secondary | ICD-10-CM | POA: Diagnosis not present

## 2024-01-26 DIAGNOSIS — Z930 Tracheostomy status: Secondary | ICD-10-CM | POA: Diagnosis not present

## 2024-01-26 DIAGNOSIS — Z89512 Acquired absence of left leg below knee: Secondary | ICD-10-CM | POA: Diagnosis not present

## 2024-01-26 DIAGNOSIS — J9620 Acute and chronic respiratory failure, unspecified whether with hypoxia or hypercapnia: Secondary | ICD-10-CM | POA: Diagnosis not present

## 2024-01-28 DIAGNOSIS — E1069 Type 1 diabetes mellitus with other specified complication: Secondary | ICD-10-CM | POA: Diagnosis not present

## 2024-01-29 ENCOUNTER — Encounter: Payer: Self-pay | Admitting: Psychiatry

## 2024-01-29 ENCOUNTER — Telehealth (INDEPENDENT_AMBULATORY_CARE_PROVIDER_SITE_OTHER): Admitting: Psychiatry

## 2024-01-29 DIAGNOSIS — F411 Generalized anxiety disorder: Secondary | ICD-10-CM

## 2024-01-29 DIAGNOSIS — F3342 Major depressive disorder, recurrent, in full remission: Secondary | ICD-10-CM | POA: Diagnosis not present

## 2024-01-29 DIAGNOSIS — F5101 Primary insomnia: Secondary | ICD-10-CM

## 2024-01-29 MED ORDER — ZALEPLON 10 MG PO CAPS: 10.0000 mg | ORAL_CAPSULE | Freq: Every evening | ORAL | 3 refills | Status: DC | PRN

## 2024-01-29 NOTE — Progress Notes (Unsigned)
 Virtual Visit via Video Note  I connected with [name] on 01/29/24 at  2:30 PM EDT by a video enabled telemedicine application and verified that I am speaking with the correct person using two identifiers.  Location Provider Location : [facility] Patient Location : Home  Participants: Patient , Provider    I discussed the limitations of evaluation and management by telemedicine and the availability of in person appointments. The patient expressed understanding and agreed to proceed.   I discussed the assessment and treatment plan with the patient. The patient was provided an opportunity to ask questions and all were answered. The patient agreed with the plan and demonstrated an understanding of the instructions.   The patient was advised to call back or seek an in-person evaluation if the symptoms worsen or if the condition fails to improve as anticipated.  BH MD OP Progress Note  01/29/2024 2:55 PM OMARIUS GRANTHAM  MRN:  [id number]  Chief Complaint:  Chief Complaint  Patient presents with   Follow-up   Anxiety   Depression   Insomnia   Medication Refill   Discussed the use of AI scribe software for clinical note transcription with the patient, who gave verbal consent to proceed.  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  He describes doing well overall and feeling good, sharing that it feels good to smile again. He expresses  anticipation for receiving his prosthetic leg and excitement about the possibility of walking and driving again. He reports that visits from his brother, including an upcoming visit this weekend, have a positive impact on his mood.  Regarding sleep, he reports sleeping well on zaleplon  (Sonata ), which he prefers over zolpidem  (Ambien ), and notes improved sleep when taking the medication earlier in the evening. He confirms currently taking Olanzapine  5 mg, Lexapro  10 mg (recently increased from 5 mg about a week ago by primary care provider), Bupropion  300 mg and Clonazepam  as needed. He continues to find coping tools learned in therapy, especially for managing panic attacks, helpful.  He is no longer following up with Ms. Veva, therapist since he has been doing fairly well.  He denies thoughts of harming himself or others.  Denies any other concerns today.   Visit Diagnosis:    ICD-10-CM   1. Recurrent major depressive disorder, in full remission  F33.42     2. GAD (generalized anxiety disorder)  F41.1     3. Primary insomnia  F51.01 zaleplon  (SONATA ) 10 MG capsule        Past Psychiatric History: I have reviewed past psychiatric history from progress note on 01/15/2019.  Past trials of medications like fluoxetine, Rexulti , Paxil, Celexa , Lexapro , Wellbutrin , Zyprexa , Seroquel , Depakote , Xanax , Abilify . Completed TMS-twice in 2021, 2024. History of ECT-did not tolerate. Multiple inpatient behavioral health admissions in the past-2020.  Possible remote suicide attempt several years ago when he tried to overdose on bug spray as well as had plans to jump in front of a train although he did not go through with it.  History of overdose on opioids  in 2014 however reports it was not a suicide attempt and that he was trying to get high and it was accidental.  Past Medical History:  Past Medical History:  Diagnosis Date   Depression    Diabetes (HCC)    Insulin  Pump   Diabetes mellitus type I  (HCC)    Diabetes mellitus without complication (HCC)    GERD (gastroesophageal reflux disease)    H/O laryngectomy    Heel bone fracture    History of embolic stroke    Hyperlipidemia    Hypertension    Radicular pain of right lower extremity    Stroke (HCC)    Suicide attempt (HCC) 2014   damaged larynx - tracheostomy   Thyroid  disease     Past Surgical History:  Procedure Laterality Date   COLONOSCOPY WITH PROPOFOL  N/A 05/15/2018   Procedure: COLONOSCOPY WITH PROPOFOL ;  Surgeon: [name], Ladell POUR, MD;  Location: [facility] ENDOSCOPY;  Service: Gastroenterology;  Laterality: N/A;   ESOPHAGOGASTRODUODENOSCOPY N/A 05/15/2018   Procedure: ESOPHAGOGASTRODUODENOSCOPY (EGD);  Surgeon: [name], Ladell POUR, MD;  Location: [facility] ENDOSCOPY;  Service: Gastroenterology;  Laterality: N/A;   FRACTURE SURGERY     Heel bone reconstruction Left    HERNIA REPAIR  02/2011   Umbilical hernia repair    LARYNGECTOMY     LOWER EXTREMITY ANGIOGRAPHY Left 01/31/2021   Procedure: LOWER EXTREMITY ANGIOGRAPHY;  Surgeon: Marea Selinda RAMAN, MD;  Location: [facility] INVASIVE CV LAB;  Service: Cardiovascular;  Laterality: Left;   LOWER EXTREMITY ANGIOGRAPHY Right 02/14/2021   Procedure: LOWER EXTREMITY ANGIOGRAPHY;  Surgeon: Marea Selinda RAMAN, MD;  Location: [facility] INVASIVE CV LAB;  Service: Cardiovascular;  Laterality: Right;   NECK SURGERY     fusion   SPINE SURGERY     TRACHEOSTOMY  2014   from SI attempt    Family Psychiatric History: I have reviewed family psychiatric history from progress note on 01/15/2019.  Family History:  Family History  Problem Relation Age of Onset   Osteoporosis Mother    Diabetes Mother    Hypertension Father    Mental illness Neg Hx     Social History: I have reviewed social history from progress note on 01/15/2019. Social History   Socioeconomic History   Marital status: Widowed    Spouse name: [name]   Number of children: Not on file   Years of education: Not on file   Highest education level:  Not on file  Occupational History   Not on file  Tobacco Use   Smoking status: Former    Current packs/day: 0.00    Types: Cigarettes    Quit date: 04/09/2013    Years since quitting: 10.8   Smokeless tobacco: Never  Vaping Use   Vaping status: Never Used  Substance and Sexual Activity   Alcohol use: Yes    Alcohol/week: 2.0 standard drinks of alcohol    Types: 2 Shots of liquor per week   Drug use: No    Comment: Pt denied; UDS not available   Sexual activity: Yes    Partners: Female    Birth control/protection: Condom  Other Topics Concern   Not on file  Social History Narrative   Not on file   Social Drivers of Health   Financial Resource Strain: Low Risk  (12/19/2023)   Received from [facility]   Overall Financial Resource Strain (CARDIA)    Difficulty of Paying Living Expenses: Not hard at all  Food Insecurity: No Food Insecurity (12/19/2023)   Received  from [facility]   Hunger Vital Sign    Within the past 12 months, you worried that your food would run out before you got the money to buy more.: Never true    Within the past 12 months, the food you bought just didn't last and you didn't have money to get more.: Never true  Transportation Needs: No Transportation Needs (12/19/2023)   Received from [facility] - Transportation    In the past 12 months, has lack of transportation kept you from medical appointments or from getting medications?: No    Lack of Transportation (Non-Medical): No  Physical Activity: Sufficiently Active (07/12/2023)   Exercise Vital Sign    Days of Exercise per Week: 7 days    Minutes of Exercise per Session: 30 min  Stress: Stress Concern Present (07/12/2023)   [employer] of Occupational Health - Occupational Stress Questionnaire    Feeling of Stress : Very much  Social Connections: Socially Isolated (07/12/2023)   Social Connection and Isolation Panel    Frequency of  Communication with Friends and Family: More than three times a week    Frequency of Social Gatherings with Friends and Family: Never    Attends Religious Services: Never    Database administrator or Organizations: No    Attends Banker Meetings: Never    Marital Status: Widowed    Allergies:  Allergies  Allergen Reactions   Buspar [Buspirone]     Makes the patient flip out   Depakote  [Valproic Acid ]     Causes excessive drowsiness   Pregabalin Other (See Comments)    Gum Bleeding   Clopidogrel Rash   Gabapentin Itching and Rash    Metabolic Disorder Labs: Lab Results  Component Value Date   HGBA1C 7.4 (H) 10/28/2022   MPG 165.68 10/28/2022   MPG 177 02/14/2021   No results found for: PROLACTIN Lab Results  Component Value Date   CHOL 161 09/15/2016   TRIG 190 (H) 09/15/2016   HDL 41 09/15/2016   CHOLHDL 3.9 09/15/2016   VLDL 38 09/15/2016   LDLCALC 82 09/15/2016   Lab Results  Component Value Date   TSH 1.952 09/15/2016    Therapeutic Level Labs: No results found for: LITHIUM Lab Results  Component Value Date   VALPROATE 19 (L) 09/12/2016   No results found for: CBMZ  Current Medications: Current Outpatient Medications  Medication Sig Dispense Refill   escitalopram  (LEXAPRO ) 5 MG tablet Take 5 mg by mouth daily. (Patient taking differently: Take 10 mg by mouth daily.)     amLODipine  (NORVASC ) 5 MG tablet Take 1 tablet (5 mg total) by mouth daily. 30 tablet 1   apixaban  (ELIQUIS ) 5 MG TABS tablet Take 1 tablet (5 mg total) by mouth 2 (two) times daily. 60 tablet 5   buPROPion  (WELLBUTRIN  XL) 300 MG 24 hr tablet TAKE 1 TABLET(300 MG) BY MOUTH DAILY WITH BREAKFAST 90 tablet 1   clonazePAM  (KLONOPIN ) 1 MG tablet TAKE 1 TABLET (1 MG TOTAL) BY MOUTH DAILY AS NEEDED FOR ANXIETY. PLEASE LIMIT USE 30 tablet 5   Continuous Glucose Sensor (DEXCOM G7 SENSOR) MISC Use 1 each every 10 (ten) days     empagliflozin  (JARDIANCE ) 25 MG TABS tablet Take 25  mg by mouth daily. (Patient taking differently: Take 12.5 mg by mouth daily.) 30 tablet 1   HUMALOG 100 UNIT/ML injection SMARTSIG:0-120 Unit(s) SUB-Q Daily     insulin  aspart (NOVOLOG ) 100  UNIT/ML injection INJECT UP TO 120 UNITS UNDER THE SKIN AS DIRECTED DAILY VIA INSULIN  PUMP     levothyroxine  (SYNTHROID ) 75 MCG tablet TAKE 1 TABLET BY MOUTH DAILY AT 6 AM 90 tablet 0   metoprolol  succinate (TOPROL -XL) 25 MG 24 hr tablet TAKE 1 TABLET(25 MG) BY MOUTH EVERY DAY     mometasone  (ELOCON ) 0.1 % cream Apply to rash on arms and chest twice daily until improved. 45 g 1   OLANZapine  (ZYPREXA ) 5 MG tablet TAKE 1 TABLET BY MOUTH EVERYDAY AT BEDTIME 90 tablet 1   ondansetron  (ZOFRAN -ODT) 4 MG disintegrating tablet Take 4 mg by mouth every 8 (eight) hours as needed for nausea or vomiting.     pantoprazole  (PROTONIX ) 40 MG tablet TAKE 1 TABLET(40 MG) BY MOUTH TWICE DAILY     rosuvastatin  (CRESTOR ) 20 MG tablet Take 20 mg by mouth at bedtime.     sodium hypochlorite (DAKIN'S 1/4 STRENGTH) 0.125 % SOLN      telmisartan (MICARDIS) 80 MG tablet Take 80 mg by mouth daily.     [START ON 02/08/2024] zaleplon  (SONATA ) 10 MG capsule Take 1 capsule (10 mg total) by mouth at bedtime as needed for sleep. Stop Zolpidem  ( Ambien ) 30 capsule 3   No current facility-administered medications for this visit.     Musculoskeletal: Strength & Muscle Tone: UTA Gait & Station: Seated Patient leans: N/A  Psychiatric Specialty Exam: Review of Systems  Psychiatric/Behavioral:  The patient is nervous/anxious.     There were no vitals taken for this visit.There is no height or weight on file to calculate BMI.  General Appearance: Casual  Eye Contact:  Fair  Speech:  Normal Rate  Volume:  baseline , S/P Trach  Mood:  Anxious  Affect:  Congruent  Thought Process:  Goal Directed and Descriptions of Associations: Intact  Orientation:  Full (Time, Place, and Person)  Thought Content: Logical   Suicidal Thoughts:  No   Homicidal Thoughts:  No  Memory:  Immediate;   Fair Recent;   Fair Remote;   Fair  Judgement:  Fair  Insight:  Fair  Psychomotor Activity:  Normal  Concentration:  Concentration: Fair and Attention Span: Fair  Recall:  [employer] of Knowledge: Fair  Language: Fair  Akathisia:  No  Handed:  Right  AIMS (if indicated): not done  Assets:  Communication Skills Desire for Improvement Housing Social Support  ADL's:  Intact  Cognition: WNL  Sleep:  Fair   Screenings: Midwife Visit from 09/26/2023 in [facility] Office Visit from 12/28/2022 in [facility] Office Visit from 04/05/2022 in [facility] Video Visit from 10/31/2021 in [facility] Office Visit from 08/30/2021 in [facility]  AIMS Total Score 0 0 0 0 0   AUDIT    Flowsheet Row Admission (Discharged) from 01/22/2019 in [facility] Admission (Discharged) from 12/31/2018 in [facility] BEH[facility]AL [facility]ICINE Admission (Discharged) from 09/14/2016 in [facility]IENT BE[facility]AL MEDICINE  Alcohol Use Disorder Identification Test Final Score (AUDIT) 0 0 1   ECT-MADRS    Flowsheet Row Admission (Discharged) from 01/22/2019 in [facility] BE[facility]AL MEDICINE  MADRS Total Score 27   GAD-7    Flowsheet Row Office Visit from 07/23/2023 in [facility] Counselor from 07/12/2023 in [facility] Office Visit from 03/20/2023 in  [facility] Video Visit from 08/01/2022 in [facility] Office Visit from 06/06/2022 in [facility]  Total GAD-7 Score 2 17 8 6 3    Mini-Mental    Flowsheet Row Admission  (Discharged) from 01/22/2019 in [facility] INPATIENT BEHAV[facility]AL MEDICINE  Total Score (max 30 points ) 30   PHQ2-9    Flowsheet Row Counselor from 11/15/2023 in [facility] Office Visit from 09/26/2023 in [facility] Office Visit from 07/23/2023 in [facility] Counselor from 07/12/2023 in [facility] Office Visit from 03/20/2023 in [facility]  PHQ-2 Total Score 1 2 2 3 2   PHQ-9 Total Score 6 6 6 16 7    Flowsheet Row Video Visit from 01/29/2024 in [facility] Video Visit from 12/27/2023 in [facility] Office Visit from 09/26/2023 in [facility]  C-SSRS RISK CATEGORY Moderate Risk No Risk Moderate Risk     Assessment and Plan: [name] is a 63 year old male, has a history of multiple medical problems including depression, anxiety, insomnia was evaluated by telemedicine today.  Discussed assessment and plan as noted below.  1. Recurrent major depressive disorder, in full remission Currently denies any significant depression symptoms at this doing well on the current medication regimen.  Lexapro  was recently uptitrated by primary care provider a week ago to target mood symptoms and that has helped as well. Continue Olanzapine  5 mg daily Continue Wellbutrin  300 mg daily Continue Lexapro  10 mg daily   2. GAD (generalized anxiety disorder)-improving Reports anxiety symptoms improved on the current medication regimen.  No longer in psychotherapy sessions since he has been doing well and has been practicing his coping strategies. Continue Lexapro  10 mg daily Continue Clonazepam  1 mg daily as needed Continue CBT as needed Reviewed [place] PMP AWARxE   3. Primary insomnia-stable Sleep  okay on the zaleplon . Continue sleep hygiene techniques Continue Zaleplon  10 mg at bedtime   Follow-up Follow-up in clinic in 2 - 3 months or sooner in person.  Collaboration of Care: Collaboration of Care: Referral or follow-up with counselor/therapist AEB encouraged to continue psychotherapy sessions as needed.  Patient/Guardian was advised Release of Information must be obtained prior to any record release in order to collaborate their care with an outside provider. Patient/Guardian was advised if they have not already done so to contact the registration department to sign all necessary forms in order for us  to release information regarding their care.   Consent: Patient/Guardian gives verbal consent for treatment and assignment of benefits for services provided during this visit. Patient/Guardian expressed understanding and agreed to proceed.  This note was generated in part or whole with voice recognition software. Voice recognition is usually quite accurate but there are transcription errors that can and very often do occur. I apologize for any typographical errors that were not detected and corrected.     Bracha Frankowski, MD 01/30/2024, 7:57 AM

## 2024-02-05 ENCOUNTER — Telehealth: Admitting: Psychiatry

## 2024-02-13 DIAGNOSIS — Z930 Tracheostomy status: Secondary | ICD-10-CM | POA: Diagnosis not present

## 2024-02-13 DIAGNOSIS — Z9002 Acquired absence of larynx: Secondary | ICD-10-CM | POA: Diagnosis not present

## 2024-02-19 DIAGNOSIS — Z430 Encounter for attention to tracheostomy: Secondary | ICD-10-CM | POA: Diagnosis not present

## 2024-02-22 NOTE — Progress Notes (Addendum)
 WRIGHT GRAVELY                                          MRN: [id number]   02/22/2024   The VBCI Quality Team Specialist reviewed this patient medical record for the purposes of chart review for care gap closure. The following were reviewed: abstraction for care gap closure-glycemic status assessment. CHART REVIEWED FOR KED LABS ALSO.  05/22/2024- cannot close ked 2025    VBCI Quality Team

## 2024-02-24 DIAGNOSIS — I639 Cerebral infarction, unspecified: Secondary | ICD-10-CM | POA: Diagnosis not present

## 2024-02-24 DIAGNOSIS — J9620 Acute and chronic respiratory failure, unspecified whether with hypoxia or hypercapnia: Secondary | ICD-10-CM | POA: Diagnosis not present

## 2024-02-24 DIAGNOSIS — Z9002 Acquired absence of larynx: Secondary | ICD-10-CM | POA: Diagnosis not present

## 2024-02-24 DIAGNOSIS — Z930 Tracheostomy status: Secondary | ICD-10-CM | POA: Diagnosis not present

## 2024-02-24 DIAGNOSIS — R220 Localized swelling, mass and lump, head: Secondary | ICD-10-CM | POA: Diagnosis not present

## 2024-02-25 DIAGNOSIS — Z930 Tracheostomy status: Secondary | ICD-10-CM | POA: Diagnosis not present

## 2024-02-25 DIAGNOSIS — Z9002 Acquired absence of larynx: Secondary | ICD-10-CM | POA: Diagnosis not present

## 2024-02-25 DIAGNOSIS — I639 Cerebral infarction, unspecified: Secondary | ICD-10-CM | POA: Diagnosis not present

## 2024-02-25 DIAGNOSIS — J9620 Acute and chronic respiratory failure, unspecified whether with hypoxia or hypercapnia: Secondary | ICD-10-CM | POA: Diagnosis not present

## 2024-02-25 DIAGNOSIS — R220 Localized swelling, mass and lump, head: Secondary | ICD-10-CM | POA: Diagnosis not present

## 2024-02-25 DIAGNOSIS — Z89512 Acquired absence of left leg below knee: Secondary | ICD-10-CM | POA: Diagnosis not present

## 2024-02-27 DIAGNOSIS — E1069 Type 1 diabetes mellitus with other specified complication: Secondary | ICD-10-CM | POA: Diagnosis not present

## 2024-03-20 ENCOUNTER — Other Ambulatory Visit: Payer: Self-pay | Admitting: Psychiatry

## 2024-03-20 DIAGNOSIS — Z9002 Acquired absence of larynx: Secondary | ICD-10-CM | POA: Diagnosis not present

## 2024-03-20 DIAGNOSIS — Z430 Encounter for attention to tracheostomy: Secondary | ICD-10-CM | POA: Diagnosis not present

## 2024-03-20 DIAGNOSIS — F411 Generalized anxiety disorder: Secondary | ICD-10-CM

## 2024-03-24 ENCOUNTER — Ambulatory Visit (HOSPITAL_COMMUNITY): Admission: EM | Admit: 2024-03-24 | Discharge: 2024-03-24 | Disposition: A | Discharge status: Home / Self Care

## 2024-03-24 ENCOUNTER — Telehealth: Payer: Self-pay | Admitting: Psychiatry

## 2024-03-24 NOTE — Telephone Encounter (Signed)
 Mom took [name] to [name] on Saturday due to deep depression but they would not allow him to stay there due to some of his health obstacles and recommended him to go to [place], [place], and [facility], but has not heard from them. Tried to get him to go to [facility] emergency room on Sunday but he would not go. She is waiting to hear from the facilities to be admitted. She did schedule the appointment for 03-25-24 at 1:30 for him to be seen. She wanted you to be aware of the circumstances and provide suggestions if able

## 2024-03-24 NOTE — Discharge Summary (Signed)
 Pt left facility after triage but before being seen by a provider.

## 2024-03-24 NOTE — Telephone Encounter (Signed)
 Contacted patient regarding mother's phone message.  Mother came into patient's residence and writer was able to speak to both patient as well as mother-[name].  Patient was taken to [facility] recently however they could not admit him due to his medical comorbidities BKA , hx of trach.  As per mother he has mentioned passive suicidality however at this time he denies any suicidal thoughts.  She however continues to believe since he is decompensating to the point that he is refusing to eat and lacks motivation to do anything he may need to be admitted however patient continues to refuse going to the emergency department since he does not want to wait there for several hours.  She was provided resources by [facility] and she is currently waiting to hear back from other facilities.  In the meantime mother agrees to make sure patient is safe at home.  Discussed safety plan including take away excess medications, make sure to have supervision at home until he gets help and to get immediate help or call [phone] if needed in the future.  Patient does have a scheduled appointment with this provider which was scheduled today once mother contacted the office and this is for an in office visit.  Discussed with mother that 2-way communication has expired and she agrees to get it completed today or tomorrow.  During our conversation patient also denied any current suicidality.

## 2024-03-24 NOTE — Telephone Encounter (Signed)
 Contacted NP Ellouise Dawn at [facility].  Patient/mother [name] has been directed to go to [facility] for possible admission.
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
## 2024-03-24 NOTE — Telephone Encounter (Signed)
 Looks like the consent to communicate with mother has expired . Please verify and update .

## 2024-03-25 ENCOUNTER — Ambulatory Visit (INDEPENDENT_AMBULATORY_CARE_PROVIDER_SITE_OTHER): Admitting: Psychiatry

## 2024-03-25 ENCOUNTER — Emergency Department
Admission: EM | Admit: 2024-03-25 | Discharge: 2024-03-26 | Disposition: A | Discharge status: Home / Self Care | Source: Ambulatory Visit

## 2024-03-25 ENCOUNTER — Encounter: Payer: Self-pay | Admitting: Psychiatry

## 2024-03-25 ENCOUNTER — Other Ambulatory Visit: Payer: Self-pay

## 2024-03-25 VITALS — BP 118/64 | HR 67 | Temp 97.9°F | Ht 70.5 in | Wt 249.0 lb

## 2024-03-25 DIAGNOSIS — Z8673 Personal history of transient ischemic attack (TIA), and cerebral infarction without residual deficits: Secondary | ICD-10-CM | POA: Diagnosis not present

## 2024-03-25 DIAGNOSIS — I10 Essential (primary) hypertension: Secondary | ICD-10-CM | POA: Insufficient documentation

## 2024-03-25 DIAGNOSIS — F5101 Primary insomnia: Secondary | ICD-10-CM

## 2024-03-25 DIAGNOSIS — F411 Generalized anxiety disorder: Secondary | ICD-10-CM | POA: Diagnosis not present

## 2024-03-25 DIAGNOSIS — F332 Major depressive disorder, recurrent severe without psychotic features: Secondary | ICD-10-CM | POA: Diagnosis not present

## 2024-03-25 DIAGNOSIS — E109 Type 1 diabetes mellitus without complications: Secondary | ICD-10-CM | POA: Diagnosis not present

## 2024-03-25 DIAGNOSIS — Z79899 Other long term (current) drug therapy: Secondary | ICD-10-CM | POA: Insufficient documentation

## 2024-03-25 DIAGNOSIS — R45851 Suicidal ideations: Secondary | ICD-10-CM | POA: Insufficient documentation

## 2024-03-25 DIAGNOSIS — F32A Depression, unspecified: Secondary | ICD-10-CM

## 2024-03-25 LAB — COMPREHENSIVE METABOLIC PANEL WITH GFR
ALT: 15 U/L (ref 0–44)
AST: 15 U/L (ref 15–41)
Albumin: 3.8 g/dL (ref 3.5–5.0)
Alkaline Phosphatase: 114 U/L (ref 38–126)
Anion gap: 9 (ref 5–15)
BUN: 26 mg/dL — ABNORMAL HIGH (ref 8–23)
CO2: 26 mmol/L (ref 22–32)
Calcium: 9 mg/dL (ref 8.9–10.3)
Chloride: 103 mmol/L (ref 98–111)
Creatinine, Ser: 1.18 mg/dL (ref 0.61–1.24)
GFR, Estimated: >60 mL/min (ref >60)
Glucose, Bld: 142 mg/dL — ABNORMAL HIGH (ref 70–99)
Potassium: 4.2 mmol/L (ref 3.5–5.1)
Sodium: 138 mmol/L (ref 135–145)
Total Bilirubin: 0.4 mg/dL (ref 0.0–1.2)
Total Protein: 6.7 g/dL (ref 6.5–8.1)

## 2024-03-25 LAB — URINE DRUG SCREEN
Amphetamines: NEGATIVE
Barbiturates: NEGATIVE
Benzodiazepines: POSITIVE — AB
Cocaine: NEGATIVE
Fentanyl: NEGATIVE
Methadone Scn, Ur: NEGATIVE
Opiates: NEGATIVE
Tetrahydrocannabinol: NEGATIVE

## 2024-03-25 LAB — CBC
HCT: 46.1 % (ref 39.0–52.0)
Hemoglobin: 15.3 g/dL (ref 13.0–17.0)
MCH: 29.8 pg (ref 26.0–34.0)
MCHC: 33.2 g/dL (ref 30.0–36.0)
MCV: 89.7 fL (ref 80.0–100.0)
Platelets: 317 K/uL (ref 150–400)
RBC: 5.14 MIL/uL (ref 4.22–5.81)
RDW: 13.4 % (ref 11.5–15.5)
WBC: 7.1 K/uL (ref 4.0–10.5)
nRBC: 0 % (ref 0.0–0.2)

## 2024-03-25 LAB — ETHANOL: Alcohol, Ethyl (B): <15 mg/dL (ref <15)

## 2024-03-25 MED ORDER — APIXABAN 5 MG PO TABS
5.0000 mg | ORAL_TABLET | Freq: Two times a day (BID) | ORAL | Status: DC
  Administered 2024-03-26 (×2): 5 mg via ORAL
  Filled 2024-03-25 (×2): qty 1

## 2024-03-25 MED ORDER — CLONAZEPAM 0.5 MG PO TABS
1.0000 mg | ORAL_TABLET | Freq: Every day | ORAL | Status: DC | PRN
  Administered 2024-03-26: 1 mg via ORAL
  Filled 2024-03-25: qty 2

## 2024-03-25 MED ORDER — PANTOPRAZOLE SODIUM 40 MG PO TBEC
40.0000 mg | DELAYED_RELEASE_TABLET | Freq: Every day | ORAL | Status: DC
  Administered 2024-03-26: 40 mg via ORAL
  Filled 2024-03-25: qty 1

## 2024-03-25 MED ORDER — CHLORTHALIDONE 25 MG PO TABS
25.0000 mg | ORAL_TABLET | Freq: Every day | ORAL | Status: DC
  Administered 2024-03-26: 25 mg via ORAL
  Filled 2024-03-25: qty 1

## 2024-03-25 MED ORDER — ESCITALOPRAM OXALATE 10 MG PO TABS
10.0000 mg | ORAL_TABLET | Freq: Every day | ORAL | Status: DC
  Administered 2024-03-26: 10 mg via ORAL
  Filled 2024-03-25: qty 1

## 2024-03-25 MED ORDER — ROSUVASTATIN CALCIUM 20 MG PO TABS
20.0000 mg | ORAL_TABLET | Freq: Every day | ORAL | Status: DC
  Administered 2024-03-26: 20 mg via ORAL
  Filled 2024-03-25 (×2): qty 1

## 2024-03-25 MED ORDER — ONDANSETRON 4 MG PO TBDP: 4.0000 mg | ORAL_TABLET | Freq: Three times a day (TID) | ORAL | Status: DC | PRN

## 2024-03-25 MED ORDER — OLANZAPINE 10 MG PO TABS: 10.0000 mg | ORAL_TABLET | Freq: Every day | ORAL | Status: DC

## 2024-03-25 MED ORDER — IRBESARTAN 150 MG PO TABS
300.0000 mg | ORAL_TABLET | Freq: Every day | ORAL | Status: DC
  Administered 2024-03-26: 300 mg via ORAL
  Filled 2024-03-25: qty 2

## 2024-03-25 MED ORDER — ZOLPIDEM TARTRATE 5 MG PO TABS
5.0000 mg | ORAL_TABLET | Freq: Every evening | ORAL | Status: DC | PRN
Start: 2024-03-25 — End: 2024-03-26
  Administered 2024-03-26: 5 mg via ORAL
  Filled 2024-03-25: qty 1

## 2024-03-25 MED ORDER — BUPROPION HCL ER (XL) 150 MG PO TB24
300.0000 mg | ORAL_TABLET | Freq: Every day | ORAL | Status: DC
  Administered 2024-03-26: 300 mg via ORAL
  Filled 2024-03-25: qty 2

## 2024-03-25 MED ORDER — METOPROLOL SUCCINATE ER 25 MG PO TB24
25.0000 mg | ORAL_TABLET | Freq: Every day | ORAL | Status: DC
  Administered 2024-03-26: 25 mg via ORAL
  Filled 2024-03-25: qty 1

## 2024-03-25 MED ORDER — OLANZAPINE 10 MG PO TABS
10.0000 mg | ORAL_TABLET | Freq: Every day | ORAL | Status: DC
  Administered 2024-03-26: 10 mg via ORAL
  Filled 2024-03-25: qty 1

## 2024-03-25 MED ORDER — LEVOTHYROXINE SODIUM 50 MCG PO TABS
75.0000 ug | ORAL_TABLET | Freq: Every day | ORAL | Status: DC
  Administered 2024-03-26: 75 ug via ORAL
  Filled 2024-03-25: qty 2

## 2024-03-25 MED ORDER — AMLODIPINE BESYLATE 5 MG PO TABS
5.0000 mg | ORAL_TABLET | Freq: Every day | ORAL | Status: DC
  Administered 2024-03-26: 5 mg via ORAL
  Filled 2024-03-25: qty 1

## 2024-03-25 MED ORDER — ESCITALOPRAM OXALATE 5 MG PO TABS
10.0000 mg | ORAL_TABLET | Freq: Every day | ORAL | Status: DC
Start: 2024-03-25 — End: 2024-03-25

## 2024-03-25 NOTE — Progress Notes (Signed)
 Patient seen for suctioning. Noted to be in no distress sitting on bed in hallway of er. Patient has had a laryngectomy and his airway tube has become plugged with secretions which had caused some difficulty with his breathing. Patient's mother was able to clean airway tube and patient was able to place tube back in stoma without difficulty. BBS noted to be clear. Emergency airway box and ambu bag placed at bedside. Patient noted to be ambulating without difficulty.

## 2024-03-25 NOTE — ED Triage Notes (Signed)
 First nurse note: pt to ED with mother for psych eval, states was told to come per Dr Coby

## 2024-03-25 NOTE — ED Triage Notes (Addendum)
 Pt arrives via POV with c/o depression x 1 month. Pt was sent here from psychiatrist to get treated and be admitted to [facility]. Pt denies SI at this time. Pt has a trach. Pt is A&Ox4 and ambulatory during triage.   Pt is a DM1 and is wearing their insulin  pump, and it a BKA on the left side. Pt is wearing their prosthetic leg, with their sock and shoe.

## 2024-03-25 NOTE — Progress Notes (Signed)
 This note is not being shared with the patient for the following reason: To prevent harm (release of this note would result in harm to the life or physical safety of the patient or another).   BH MD OP Progress Note  03/26/2024 8:09 AM [name]  MRN:  [id number]  Chief Complaint:  Chief Complaint  Patient presents with   Follow-up   Depression   Medication Problem   Insomnia   Discussed the use of AI scribe software for clinical note transcription with the patient, who gave verbal consent to proceed.  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  He is accompanied by his mother [name].  His mother reports that he has been experiencing worsening depression, describing that he feels increasingly unable to function independently and is wearing out. He states that he feels doomed and wishes he was dead. He reports persistent feelings of not feeling right and expresses significant hopelessness. He describes difficulty with motivation and energy, stating he cannot do anything on his own and does not want to get up to prepare food. His mother notes that he will eat if she prepares food for him but otherwise lacks initiative to cook, even though he knows how.  He lives alone, his parents visits him regularly.  He has not wanted to drive, stating he does not feel mentally able to do so, and spends most of  his time in the house.  Sleep concerns persist, as he reports poor sleep and states that his sleep medications no longer help. His mother notes that he was asleep at 11 AM yesterday, but he clarifies that he was not sound asleep and wakes easily. He describes ongoing fatigue and low energy throughout the day.  Anxiety remains a significant issue, particularly in situations where he must wait for long periods in confined spaces, which he finds claustrophobic and distressing. He describes episodes of feeling unable to breathe, which he relates to his depression and possibly panic or anxiety attacks. He expresses fear of dying due to these episodes of breathlessness.  He reports that his current medications do not help his mood or sleep. His regimen includes Wellbutrin  XL 300 mg, clonazepam  1 mg, Lexapro  10 mg, olanzapine  5 mg , and Sonata  10 mg for sleep.   He and his mother describe significant psychosocial stress related to repeated unsuccessful attempts to access inpatient care, long wait times at urgent care and emergency departments, and difficulty finding facilities able to accommodate his medical needs.  As per mother patient was taken to [facility] urgent care last week , he was discharged home and was told that they will notify them once they find placement due to patient's comorbid medical conditions.  Patient was taken to [facility] yesterday after writer advised them to do so.  However patient as well as mother reports that after waiting a long time at the urgent care they decided to leave since patient became uncomfortable with sitting too long due to his medical  comorbidities.  He expresses frustration and hopelessness about the process and doubts that any facility will accept him due to his comorbidities.  He describes that his medical issues, including breathlessness and mobility limitations, contribute to his distress and functional decline.  Visit Diagnosis:     ICD-10-CM   1. Severe episode of recurrent major depressive disorder, without psychotic features (HCC)  F33.2 OLANZapine  (ZYPREXA ) 10 MG tablet    DISCONTINUED: escitalopram  (LEXAPRO ) 5 MG tablet    2. GAD (generalized anxiety disorder)  F41.1 DISCONTINUED: escitalopram  (LEXAPRO ) 5 MG tablet    3. Primary insomnia  F51.01       Past Psychiatric History: I have reviewed past psychiatric history from progress note on 01/15/2019.  Past trials of medications like fluoxetine, Rexulti , Paxil, Celexa , Lexapro , Wellbutrin , Zyprexa , Seroquel , Depakote , Xanax , Abilify . Completed TMS-twice in 2021, 2024. History of ECT-did not tolerate. Multiple inpatient behavioral health admissions in the past-2020.  Possible remote suicide attempt several years ago when he tried to overdose on bug spray as well as had plans to jump in front of a train although he did not go through with it.  History of overdose on opioids in 2014 however reports it was not a suicide attempt and that he was trying to get high and it was accidental.  Past Medical History: He has diabetes, a prosthetic leg, and a history of laryngectomy. He uses an insulin  pump and portable suction pump.  Past Medical History:  Diagnosis Date   Depression    Diabetes (HCC)    Insulin  Pump   Diabetes mellitus type I (HCC)    Diabetes mellitus without complication (HCC)    GERD (gastroesophageal reflux disease)    H/O laryngectomy    Heel bone fracture    History of embolic stroke    Hyperlipidemia    Hypertension    Radicular pain of right lower extremity    Stroke (HCC)    Suicide attempt (HCC) 2014   damaged larynx - tracheostomy   Thyroid  disease     Past Surgical History:  Procedure Laterality Date   COLONOSCOPY WITH PROPOFOL  N/A 05/15/2018   Procedure: COLONOSCOPY WITH PROPOFOL ;  Surgeon: [name], Ladell POUR, MD;  Location: [facility] ENDOSCOPY;  Service: Gastroenterology;  Laterality: N/A;   ESOPHAGOGASTRODUODENOSCOPY N/A 05/15/2018   Procedure:  ESOPHAGOGASTRODUODENOSCOPY (EGD);  Surgeon: [name], Ladell POUR, MD;  Location: [facility] ENDOSCOPY;  Service: Gastroenterology;  Laterality: N/A;   FRACTURE SURGERY     Heel bone reconstruction Left    HERNIA REPAIR  02/2011   Umbilical hernia repair    LARYNGECTOMY     LOWER EXTREMITY ANGIOGRAPHY Left 01/31/2021   Procedure: LOWER EXTREMITY ANGIOGRAPHY;  Surgeon: Marea Selinda RAMAN, MD;  Location: [facility] INVASIVE CV LAB;  Service: Cardiovascular;  Laterality: Left;   LOWER EXTREMITY ANGIOGRAPHY Right 02/14/2021   Procedure: LOWER EXTREMITY ANGIOGRAPHY;  Surgeon: Marea Selinda RAMAN, MD;  Location: [facility] INVASIVE CV LAB;  Service: Cardiovascular;  Laterality: Right;   NECK SURGERY     fusion   SPINE SURGERY     TRACHEOSTOMY  2014   from SI attempt    Family Psychiatric History: I have reviewed family psychiatric history from progress note on 01/15/2019.  Family History:  Family History  Problem Relation Age of Onset   Osteoporosis Mother    Diabetes Mother    Hypertension Father    Mental illness Neg Hx     Social History: I have reviewed social history from progress note on 01/15/2019. Social History   Socioeconomic  History   Marital status: Widowed    Spouse name: [name]   Number of children: Not on file   Years of education: Not on file   Highest education level: Not on file  Occupational History   Not on file  Tobacco Use   Smoking status: Former    Current packs/day: 0.00    Types: Cigarettes    Quit date: 04/09/2013    Years since quitting: 10.9   Smokeless tobacco: Never  Vaping Use   Vaping status: Never Used  Substance and Sexual Activity   Alcohol use: Yes    Alcohol/week: 2.0 standard drinks of alcohol    Types: 2 Shots of liquor per week   Drug use: No    Comment: Pt denied; UDS not available   Sexual activity: Yes    Partners: Female    Birth control/protection: Condom  Other Topics Concern   Not on file  Social History Narrative   Not on file   Social Drivers of Health    Financial Resource Strain: Low Risk  (12/19/2023)   Received from [facility]   Overall Financial Resource Strain (CARDIA)    Difficulty of Paying Living Expenses: Not hard at all  Food Insecurity: No Food Insecurity (12/19/2023)   Received from [facility]   Hunger Vital Sign    Within the past 12 months, you worried that your food would run out before you got the money to buy more.: Never true    Within the past 12 months, the food you bought just didn't last and you didn't have money to get more.: Never true  Transportation Needs: No Transportation Needs (12/19/2023)   Received from [facility] - Transportation    In the past 12 months, has lack of transportation kept you from medical appointments or from getting medications?: No    Lack of Transportation (Non-Medical): No  Physical Activity: Sufficiently Active (07/12/2023)   Exercise Vital Sign    Days of Exercise per Week: 7 days    Minutes of Exercise per Session: 30 min  Stress: Stress Concern Present (07/12/2023)   [employer] - Occupational Stress Questionnaire    Feeling of Stress : Very much  Social Connections: Socially Isolated (07/12/2023)   Social Connection and Isolation Panel    Frequency of Communication with Friends and Family: More than three times a week    Frequency of Social Gatherings with Friends and Family: Never    Attends Religious Services: Never    Database Administrator or Organizations: No    Attends Banker Meetings: Never    Marital Status: Widowed    Allergies:  Allergies  Allergen Reactions   Pregabalin Other (See Comments), Anaphylaxis and Swelling    Gum Bleeding  Mouth/ gums swelling and bleeding  Mouth swelling and bleeding gums   Buspar [Buspirone]     Makes the patient flip out   Depakote  [Valproic Acid ]     Causes excessive drowsiness   Clopidogrel Rash   Gabapentin Itching and  Rash    Metabolic Disorder Labs: Lab Results  Component Value Date   HGBA1C 7.4 (H) 10/28/2022   MPG 165.68 10/28/2022   MPG 177 02/14/2021   No results found for: PROLACTIN Lab Results  Component Value Date   CHOL 161 09/15/2016   TRIG 190 (H) 09/15/2016   HDL 41 09/15/2016   CHOLHDL 3.9 09/15/2016   VLDL 38 09/15/2016  LDLCALC 82 09/15/2016   Lab Results  Component Value Date   TSH 1.952 09/15/2016    Therapeutic Level Labs: No results found for: LITHIUM Lab Results  Component Value Date   VALPROATE 19 (L) 09/12/2016   No results found for: CBMZ  Current Medications: No current facility-administered medications for this visit.   Current Outpatient Medications  Medication Sig Dispense Refill   amLODipine  (NORVASC ) 5 MG tablet Take 1 tablet (5 mg total) by mouth daily. 30 tablet 1   apixaban  (ELIQUIS ) 5 MG TABS tablet Take 1 tablet (5 mg total) by mouth 2 (two) times daily. 60 tablet 5   buPROPion  (WELLBUTRIN  XL) 300 MG 24 hr tablet TAKE 1 TABLET(300 MG) BY MOUTH DAILY WITH BREAKFAST 90 tablet 1   clonazePAM  (KLONOPIN ) 1 MG tablet Take 1 tablet (1 mg total) by mouth daily as needed for anxiety. Please limit use 30 tablet 1   Continuous Glucose Sensor (DEXCOM G7 SENSOR) MISC Use 1 each every 10 (ten) days     HUMALOG 100 UNIT/ML injection Inject 20-120 Units into the skin daily. Uses with Insulin  Pump     levothyroxine  (SYNTHROID ) 75 MCG tablet TAKE 1 TABLET BY MOUTH DAILY AT 6 AM 90 tablet 0   metoprolol  succinate (TOPROL -XL) 25 MG 24 hr tablet TAKE 1 TABLET(25 MG) BY MOUTH EVERY DAY     mometasone  (ELOCON ) 0.1 % cream Apply to rash on arms and chest twice daily until improved. 45 g 1   OLANZapine  (ZYPREXA ) 10 MG tablet Take 1 tablet (10 mg total) by mouth at bedtime. Has supplies     ondansetron  (ZOFRAN -ODT) 4 MG disintegrating tablet Take 4 mg by mouth every 8 (eight) hours as needed for nausea or vomiting. (Patient not taking: Reported on 03/25/2024)      pantoprazole  (PROTONIX ) 40 MG tablet TAKE 1 TABLET(40 MG) BY MOUTH TWICE DAILY     rosuvastatin  (CRESTOR ) 20 MG tablet Take 20 mg by mouth at bedtime.     sodium hypochlorite (DAKIN'S 1/4 STRENGTH) 0.125 % SOLN  (Patient not taking: Reported on 03/25/2024)     telmisartan (MICARDIS) 80 MG tablet Take 80 mg by mouth daily.     zaleplon  (SONATA ) 10 MG capsule Take 1 capsule (10 mg total) by mouth at bedtime as needed for sleep. Stop Zolpidem  ( Ambien ) 30 capsule 3   chlorthalidone (HYGROTON) 25 MG tablet Take 25 mg by mouth daily.     clindamycin-benzoyl peroxide (BENZACLIN) gel Apply 1 Application topically 2 (two) times daily.     escitalopram  (LEXAPRO ) 10 MG tablet Take 10 mg by mouth daily.     LANTUS  SOLOSTAR 100 UNIT/ML Solostar Pen Inject 20 Units into the skin at bedtime. (Patient not taking: Reported on 03/25/2024)     Facility-Administered Medications Ordered in Other Visits  Medication Dose Route Frequency Provider Last Rate Last Admin   amLODipine  (NORVASC ) tablet 5 mg  5 mg Oral Daily Fernand Rossie HERO, MD       apixaban  (ELIQUIS ) tablet 5 mg  5 mg Oral BID Fernand Rossie HERO, MD   5 mg at 03/26/24 0053   buPROPion  (WELLBUTRIN  XL) 24 hr tablet 300 mg  300 mg Oral Daily Fernand Rossie HERO, MD       chlorthalidone (HYGROTON) tablet 25 mg  25 mg Oral Daily Fernand Rossie HERO, MD       clonazePAM  (KLONOPIN ) tablet 1 mg  1 mg Oral Daily PRN Fernand Rossie HERO, MD   1 mg at 03/26/24 [phone]  escitalopram  (LEXAPRO ) tablet 10 mg  10 mg Oral Daily Fernand Loop M, MD       irbesartan BAXTER) tablet 300 mg  300 mg Oral Daily Fernand Loop HERO, MD       levothyroxine  (SYNTHROID ) tablet 75 mcg  75 mcg Oral Q0600 Fernand Loop HERO, MD   75 mcg at 03/26/24 [id number]   metoprolol  succinate (TOPROL -XL) 24 hr tablet 25 mg  25 mg Oral Daily Fernand Loop HERO, MD       OLANZapine  (ZYPREXA ) tablet 10 mg  10 mg Oral QHS Fernand Loop HERO, MD   10 mg at 03/26/24 0053   ondansetron  (ZOFRAN -ODT) disintegrating tablet 4 mg  4 mg Oral  Q8H PRN Fernand Loop HERO, MD       pantoprazole  (PROTONIX ) EC tablet 40 mg  40 mg Oral Daily Fernand Loop HERO, MD       rosuvastatin  (CRESTOR ) tablet 20 mg  20 mg Oral QHS Fernand Loop HERO, MD   20 mg at 03/26/24 9947   zolpidem  (AMBIEN ) tablet 5 mg  5 mg Oral QHS PRN Fernand Loop HERO, MD   5 mg at 03/26/24 9946     Musculoskeletal: Strength & Muscle Tone: within normal limits Gait & Station: [facility] with a walker, below-knee amputee with prosthetics  Patient leans: N/A  Psychiatric Specialty Exam: Review of Systems  Psychiatric/Behavioral:  Positive for decreased concentration, dysphoric mood, sleep disturbance and suicidal ideas. The patient is nervous/anxious.     Blood pressure 118/64, pulse 67, temperature 97.9 F (36.6 C), temperature source Temporal, height 5' 10.5 (1.791 m), weight 249 lb (112.9 kg), SpO2 98%.Body mass index is 35.22 kg/m.  General Appearance: Casual  Eye Contact:  Minimal  Speech:  Normal Rate, status post trach  Volume:  Baseline  Mood:  Anxious and Depressed  Affect:  Depressed  Thought Process:  Goal Directed and Descriptions of Associations: Intact  Orientation:  Full (Time, Place, and Person)  Thought Content: Rumination   Suicidal Thoughts:  Reports passive suicidality although denies any active intent or plan at this time, however  patient is hopeless severely depressed with multiple medical comorbidities and as per mother patient has been unmotivated to get out of bed, take care of himself or eat unless prompted to do so, and eats only when mother prepares food for him.  Homicidal Thoughts:  No  Memory:  Immediate;   Fair Recent;   Fair Remote;   Fair  Judgement:  Fair  Insight:  Fair  Psychomotor Activity:  Decreased  Concentration:  Concentration: Poor and Attention Span: Poor  Recall:  Poor  Fund of Knowledge: Fair  Language: Fair  Akathisia:  No  Handed:  Right  AIMS (if indicated): done  Assets:  Desire for Improvement Housing Social  Support Transportation  ADL's:  Intact with support  Cognition: WNL  Sleep:  Poor   Screenings: Geneticist, Molecular Office Visit from 03/25/2024 in [facility] Office Visit from 09/26/2023 in [facility] Office Visit from 12/28/2022 in [facility] Office Visit from 04/05/2022 in [facility] Video Visit from 10/31/2021 in [facility]  AIMS Total Score 0 0 0 0 0   AUDIT    Flowsheet Row Admission (Discharged) from 01/22/2019 in [facility]IENT [facility]AVIORAL MEDICINE Admission (Discharged) from 12/31/2018 in [facility] INPATIENT BEHAVIORAL MEDICINE Admission (Discharged) from 09/14/2016 in [facility] INPATIENT BEHAVIORAL MEDICINE  Alcohol Use Disorder Identification Test Final Score (AUDIT) 0 0 1   ECT-MADRS    Flowsheet Row Admission (Discharged) from 01/22/2019 in [facility] INPATIENT BEHAVIORAL MEDICINE  MADRS Total Score 27   GAD-7    Flowsheet Row Office Visit from 03/25/2024 in [facility] Office Visit from 07/23/2023 in [facility] Counselor from 07/12/2023 in [facility] Office Visit from 03/20/2023 in [facility] Video Visit from 08/01/2022 in [facility]  Total GAD-7 Score 11 2 17 8 6    Mini-Mental    Flowsheet Row Admission (Discharged) from 01/22/2019 in [facility] INPATIENT BE[facility]AVIORAL MEDICINE  Total Score (max 30 points ) 30   PHQ2-9    Flowsheet Row Office Visit from 03/25/2024 in [facility] Counselor from 11/15/2023 in [facility] Office Visit from 09/26/2023 in [facility] Office  Visit from 07/23/2023 in [facility] Counselor from 07/12/2023 in [facility]  PHQ-2 Total Score 6 1 2 2 3   PHQ-9 Total Score 22 6 6 6 16    Flowsheet Row ED from 03/25/2024 in [facility] Emergency Department at [facility] Most recent reading at 03/25/2024  5:15 PM Office Visit from 03/25/2024 in [facility] Most recent reading at 03/25/2024  1:30 PM ED from 03/24/2024 in [facility] Most recent reading at 03/24/2024  4:40 PM  C-SSRS RISK CATEGORY No Risk Moderate Risk Moderate Risk     Assessment and Plan: [name] is a 63 year old male, presented for follow-up appointment, discussed assessment and plan as noted below.  1. Severe episode of recurrent major depressive disorder, without psychotic features (HCC)-unstable Patient currently with worsening depression symptoms with passive suicidality will benefit from inpatient admission.  Patient as well as family attempted to get patient admitted, several urgent care visits in the past few days, however due to physical discomfort due to medical comorbidities, long wait time has led to patient leaving without being evaluated. Increase Olanzapine  to 10 mg daily Continue Wellbutrin  300 mg daily Continue Lexapro  10 mg daily  2. GAD (generalized anxiety disorder)-unstable Currently with worsening anxiety symptoms Continue Lexapro  10 mg daily Continue Clonazepam  1 mg daily as needed   3. Primary insomnia-unstable Currently reports sleep problems. Olanzapine  has been increased as noted above. Continue Zaleplon  10 mg at bedtime as needed  Collateral information obtained from mother who was present in session.  Safety plan discussed, mother informed to lock away access medication, restrict access to anything sharp.  Denies any access to gun/firearms.  Mother agrees to supervise patient  closely. Writer contacted [facility] emergency department, communicated with staff, emergency department physician, inpatient admissions regarding this patient, regarding the need for inpatient admission, his recent history of multiple emergency department/urgent care visits without being evaluated and current decompensation with passive suicidality.  Patient as well as mother were directed to go to [facility] emergency department for evaluation and possible inpatient admission.  Follow-up in clinic in 1 to 2 weeks or sooner if needed.    Collaboration of Care: Collaboration of Care: Other as noted above  Patient/Guardian was advised Release of Information must be obtained prior to any record release in order to collaborate their care with an outside provider. Patient/Guardian was advised if they have not already done so to contact the registration department  to sign all necessary forms in order for us  to release information regarding their care.   Consent: Patient/Guardian gives verbal consent for treatment and assignment of benefits for services provided during this visit. Patient/Guardian expressed understanding and agreed to proceed.   I have spent atleast 40 minutes face to face with patient today which includes the time spent for preparing to see the patient ( e.g., review of test, records ), obtaining and to review and separately obtained history , ordering medications and test ,psychoeducation and supportive psychotherapy and care coordination,as well as documenting clinical information in electronic health records.  This note was generated in part or whole with voice recognition software. Voice recognition is usually quite accurate but there are transcription errors that can and very often do occur. I apologize for any typographical errors that were not detected and corrected.     Eilee Schader, MD 03/26/2024, 8:09 AM

## 2024-03-25 NOTE — ED Provider Notes (Signed)
 [facility] Provider Note    Event Date/Time   First MD Initiated Contact with Patient 03/25/24 1741     (approximate)   History   Psychiatric Evaluation   HPI  BARCLAY LENNOX is a 63 y.o. male who presents today with concern of worsening depression.  Sent in by Dr. Coby from psychiatry who gave me a message regarding the patient's presentation.  Apparently worsening depression and suicidality over the last few days with multiple ER visits which have resulted in him leaving without being seen due to prolonged wait time.  He has multiple medical comorbidities including a tracheostomy as well as bilateral below-knee amputations and diabetes.  Unfortunately is not able to sit for long periods of time.  He states that he has had worsening depression over the last few days, apparently our psychiatric team is aware.  He has no other acute medical complaints at this time.      Physical Exam   Triage Vital Signs: ED Triage Vitals  Encounter Vitals Group     BP 03/25/24 1713 137/74     Girls Systolic BP Percentile --      Girls Diastolic BP Percentile --      Boys Systolic BP Percentile --      Boys Diastolic BP Percentile --      Pulse Rate 03/25/24 1713 62     Resp 03/25/24 1713 17     Temp 03/25/24 1713 98.2 F (36.8 C)     Temp Source 03/25/24 1713 Oral     SpO2 03/25/24 1713 97 %     Weight 03/25/24 1714 249 lb (112.9 kg)     Height 03/25/24 1714 6' (1.829 m)     Head Circumference --      Peak Flow --      Pain Score 03/25/24 1713 0     Pain Loc --      Pain Education --      Exclude from Growth Chart --     Most recent vital signs: Vitals:   03/25/24 1713  BP: 137/74  Pulse: 62  Resp: 17  Temp: 98.2 F (36.8 C)  SpO2: 97%     General: Awake, no distress.  CV:  Good peripheral perfusion.  Resp:  Normal effort.  Abd:  No distention.  Other:     ED Results / Procedures / Treatments   Labs (all labs ordered are listed, but only  abnormal results are displayed) Labs Reviewed  COMPREHENSIVE METABOLIC PANEL WITH GFR - Abnormal; Notable for the following components:      Result Value   Glucose, Bld 142 (*)    BUN 26 (*)    All other components within normal limits  URINE DRUG SCREEN - Abnormal; Notable for the following components:   Benzodiazepines POSITIVE (*)    All other components within normal limits  ETHANOL  CBC     EKG     RADIOLOGY   PROCEDURES:  Critical Care performed: No  Procedures   MEDICATIONS ORDERED IN ED: Medications - No data to display   IMPRESSION / MDM / ASSESSMENT AND PLAN / ED COURSE  I reviewed the triage vital signs and the nursing notes.                               Patient's presentation is most consistent with acute, uncomplicated illness.  63 year old male who presents today with concern  of suicidality and depression.  Psychiatric team already aware, will have patient admitted to inpatient psychiatry for further assessment and evaluation.  The patient has been placed in psychiatric observation due to the need to provide a safe environment for the patient while obtaining psychiatric consultation and evaluation, as well as ongoing medical and medication management to treat the patient's condition.  The patient has not been placed under full IVC at this time.      FINAL CLINICAL IMPRESSION(S) / ED DIAGNOSES   Final diagnoses:  Depression, unspecified depression type     Rx / DC Orders   ED Discharge Orders     None        Note:  This document was prepared using Dragon voice recognition software and may include unintentional dictation errors.   Fernand Rossie HERO, MD 03/25/24 2329

## 2024-03-25 NOTE — BH Assessment (Incomplete)
 Comprehensive Clinical Assessment (CCA) Note  03/25/2024 [name] [id number] Recommendations for Services/Supports/Treatments: Consulted with NP Jon HERO., who recommended pt for inpatient treatment. [name] is a 63 year old, English speaking, Caucasian male. Pt presented to [facility] ED voluntarily. Per triage note First nurse note: pt to ED with mother for psych eval, states was told to come per Dr Coby  Patient presented with ongoing depressive symptoms, endorsing passive suicidal ideation (SI) without a specific plan. Patient verbalized distress related to physical health limitations, isolation, and lack of support. Mental Status / Assessment Orientation: Oriented x4. Thought Process: Relevant, intact. Mood/Affect: Depressed mood with congruent affect. Insight/Judgment: Good insight; able to articulate impact of isolation and loneliness on functioning. Risk Assessment: Endorses passive SI, no plan or intent. Denies homicidal ideation (HI), auditory/visual hallucinations (AVH). No acute psychosis observed. Substance Screen: BAL/UDS unremarkable upon arrival. Patient Statements "You wake up one morning with a leg missing; it's a tough pill to swallow." Expressed belief that he cannot live long in current state due to hopelessness, low energy, and anhedonia. Identified stressors: multiple physical health issues, lack of transportation, lack of support. Reported living alone, with aging parents providing limited assistance. Expressed desire for medication adjustments; currently in contemplation stage of change. Clinical Summary Patient demonstrates significant depressive symptoms with passive SI but no imminent risk requiring inpatient admission. Insight is preserved, and patient is motivated for treatment adjustments. Stressors include medical comorbidities, social isolation, and limited support. Chief Complaint:  Chief Complaint  Patient presents with   Psychiatric Evaluation    Visit Diagnosis: MDD, recurrent severe    CCA Screening, Triage and Referral (STR)  Patient Reported Information How did you hear about us ? Family/Friend  Referral name: Dr. Coby  Referral phone number: No data recorded  Whom do you see for routine medical problems? Primary Care  Practice/Facility Name: [facility]  Practice/Facility Phone Number: No data recorded Name of Contact: No data recorded Contact Number: No data recorded Contact Fax Number: No data recorded Prescriber Name: No data recorded Prescriber Address (if known): No data recorded  What Is the Reason for Your Visit/Call Today? [name] is a 63 year old male presenting to [facility] accompanied by his parents. Pt states he is feeling very depressed. Pt reports that he is having suicidal thoughts at this time and currently has no plan to end his life. Pt states he is taking medication for anxiety and depression and mentions he is not sure if it is working. Pt reports that his doctor stated she wanted him to be seen for inpatient. Pt states he is tired of living this way. Pt states, I thought of shooting myself with a gun, but I feel like I would never do it.  Pt denies, substance use, Hi and AVH.  How Long Has This Been Causing You Problems? 1-6 months  What Do You Feel Would Help You the Most Today? Treatment for Depression or other mood problem   Have You Recently Been in Any Inpatient Treatment (Hospital/Detox/Crisis Center/28-Day Program)? Yes  Name/Location of Program/Hospital:No data recorded How Long Were You There? No data recorded When Were You Discharged? No data recorded  Have You Ever Received Services From [facility] Before? Yes  Who Do You See at [facility]? Dr. Coby   Have You Recently Had Any Thoughts About Hurting Yourself? Yes  Are You Planning to Commit Suicide/Harm Yourself At This time? No   Have you Recently Had Thoughts About Hurting Someone Sherral? No  Explanation: No  data  recorded  Have You Used Any Alcohol or Drugs in the Past 24 Hours? No  How Long Ago Did You Use Drugs or Alcohol? No data recorded What Did You Use and How Much? No data recorded  Do You Currently Have a Therapist/Psychiatrist? Yes  Name of Therapist/Psychiatrist: Dr. Coby   Have You Been Recently Discharged From Any Office Practice or Programs? No  Explanation of Discharge From Practice/Program: No data recorded    CCA Screening Triage Referral Assessment Type of Contact: Face-to-Face  Is this Initial or Reassessment? No data recorded Date Telepsych consult ordered in [facility]:  No data recorded Time Telepsych consult ordered in [facility]:  No data recorded  Patient Reported Information Reviewed? No data recorded Patient Left Without Being Seen? No data recorded Reason for Not Completing Assessment: No data recorded  Collateral Involvement: None   Does Patient Have a Court Appointed Legal Guardian? No data recorded Name and Contact of Legal Guardian: No data recorded If Minor and Not Living with Parent(s), Who has Custody? No data recorded Is CPS involved or ever been involved? Never  Is APS involved or ever been involved? Never   Patient Determined To Be At Risk for Harm To Self or Others Based on Review of Patient Reported Information or Presenting Complaint? Yes, for Self-Harm  Method: No Plan  Availability of Means: No data recorded Intent: No data recorded Notification Required: No data recorded Additional Information for Danger to Others Potential: No data recorded Additional Comments for Danger to Others Potential: No data recorded Are There Guns or Other Weapons in Your Home? No  Types of Guns/Weapons: No data recorded Are These Weapons Safely Secured?                            No data recorded Who Could Verify You Are Able To Have These Secured: No data recorded Do You Have any Outstanding Charges, Pending Court Dates, Parole/Probation? No  Contacted To Inform  of Risk of Harm To Self or Others: No data recorded  Location of Assessment: No data recorded  Does Patient Present under Involuntary Commitment? No  IVC Papers Initial File Date: No data recorded  [place] of Residence: [place]   Patient Currently Receiving the Following Services: Medication Management   Determination of Need: Urgent (48 hours)   Options For Referral: Outpatient Therapy     CCA Biopsychosocial Intake/Chief Complaint:  Depression  Current Symptoms/Problems: I just don't even feel like I want to be here anymore. I'm just tired of fighting. Everybody keeps telling me I can do this, I can do that, but it's not as easy as everybody says. And I get down and all I want to do is get in the bed and go to sleep.   Patient Reported Schizophrenia/Schizoaffective Diagnosis in Past: No data recorded  Strengths: I have no idea.  Preferences: I prefer talking to a male and I'd rather be one-on-one talking to someone. I don't like a group. I've done that before and I feel like I just absorbed everybody else's problems.  Abilities: Well I was good at [facility] work but I don't ever do that anymore because I can't walk.   Type of Services Patient Feels are Needed: A miracle.   Initial Clinical Notes/Concerns: No data recorded  Mental Health Symptoms Depression:  Change in energy/activity; Fatigue; Hopelessness; Worthlessness; Sleep (too much or little); Irritability   Duration of Depressive symptoms: Greater than two weeks  Mania:  None   Anxiety:   Difficulty concentrating; Fatigue; Irritability; Worrying; Sleep   Psychosis:  None   Duration of Psychotic symptoms: No data recorded  Trauma:  Re-experience of traumatic event (Sometimes I have nightmares about me and my wife.)   Obsessions:  None   Compulsions:  None   Inattention:  None   Hyperactivity/Impulsivity:  None   Oppositional/Defiant Behaviors:  None   Emotional Irregularity:   None   Other Mood/Personality Symptoms:  No data recorded   Mental Status Exam Appearance and self-care  Stature:  Average   Weight:  Overweight   Clothing:  Casual   Grooming:  Well-groomed   Cosmetic use:  None   Posture/gait:  Normal   Motor activity:  Not Remarkable   Sensorium  Attention:  Normal   Concentration:  Normal   Orientation:  X5   Recall/memory:  Normal   Affect and Mood  Affect:  Depressed   Mood:  Dysphoric   Relating  Eye contact:  Normal   Facial expression:  Sad   Attitude toward examiner:  Cooperative   Thought and Language  Speech flow: Clear and Coherent   Thought content:  Appropriate to Mood and Circumstances   Preoccupation:  None   Hallucinations:  None   Organization:  No data recorded  [employer] of Knowledge:  Good   Intelligence:  Average   Abstraction:  Functional   Judgement:  Fair   Dance Movement Psychotherapist:  Realistic   Insight:  Fair   Decision Making:  Normal   Social Functioning  Social Maturity:  Isolates   Social Judgement:  Normal   Stress  Stressors:  Grief/losses; Illness   Coping Ability:  Exhausted   Skill Deficits:  Activities of daily living   Supports:  Family (Mom and dad)     Religion:    Leisure/Recreation:    Exercise/Diet:     CCA Employment/Education Employment/Work Situation:    Education:     CCA Family/Childhood History Family and Relationship History:    Childhood History:     Child/Adolescent Assessment:     CCA Substance Use Alcohol/Drug Use:                           ASAM's:  Six Dimensions of Multidimensional Assessment  Dimension 1:  Acute Intoxication and/or Withdrawal Potential:      Dimension 2:  Biomedical Conditions and Complications:      Dimension 3:  Emotional, Behavioral, or Cognitive Conditions and Complications:     Dimension 4:  Readiness to Change:     Dimension 5:  Relapse, Continued use, or Continued  Problem Potential:     Dimension 6:  Recovery/Living Environment:     ASAM Severity Score:    ASAM Recommended Level of Treatment:     Substance use Disorder (SUD)    Recommendations for Services/Supports/Treatments:    DSM5 Diagnoses: Patient Active Problem List   Diagnosis Date Noted   Atrial fibrillation with rapid ventricular response (HCC) 04/09/2023   Amputation stump infection (HCC) 04/03/2023   Sepsis (HCC) 04/03/2023   Adhesive capsulitis of left shoulder 02/09/2023   Abdominal pain 10/27/2022   Bereavement 10/31/2021   Hx of BKA, left (HCC) 04/26/2021   Osteomyelitis of left foot (HCC) 04/01/2021   Atherosclerosis of native arteries of the extremities with gangrene (HCC) 03/08/2021   Atherosclerosis of artery of extremity with rest pain (HCC) 02/14/2021   Weight gain due  to medication 01/06/2021   Primary insomnia 08/03/2020   No-show for appointment 06/22/2020   MDD (major depressive disorder), recurrent, in partial remission 05/31/2020   At risk for prolonged QT interval syndrome 04/28/2020   Recurrent major depression resistant to treatment 04/28/2020   MDD (major depressive disorder), recurrent, in full remission 10/29/2019   Insomnia due to mental condition 07/30/2019   Obsessive compulsive disorder 01/23/2019   MDD (major depressive disorder), recurrent episode, severe (HCC) 01/22/2019   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   MDD (major depressive disorder), recurrent episode, moderate (HCC) 01/15/2019   GAD (generalized anxiety disorder) 01/15/2019   Toxic effect of petroleum products, intentional self-harm, sequela 01/01/2019   Suicide attempt (HCC) 01/01/2019   Tracheostomy in place [facility]) 01/01/2019   Closed fracture of lateral malleolus of right fibula 11/18/2018   Type 1 diabetes mellitus (HCC) 09/14/2016   Major depressive disorder, recurrent, severe without psychotic features (HCC) 09/13/2016   Acquired hypothyroidism 09/13/2016   HTN (hypertension)  09/13/2016   Dyslipidemia 09/13/2016   GERD (gastroesophageal reflux disease) 09/13/2016   H/O laryngectomy 06/30/2014   Dysphagia 06/22/2014   Anxiety state 12/22/2013   Acute embolism and thrombosis of unspecified deep veins of unspecified lower extremity (HCC) 09/15/2013   History of tracheostomy 09/15/2013   Hyperlipidemia 09/15/2013   Hyperlipidemia due to type 1 diabetes mellitus (HCC) 09/15/2013   Thromboembolism of deep veins of lower extremity (HCC) 09/15/2013   Airway obstruction 08/19/2013   Subglottic stenosis 08/19/2013   Laryngeal stridor 06/19/2013   NSTEMI (non-ST elevated myocardial infarction) (HCC) 06/19/2013   Tobacco dependence in remission 06/12/2013   Autonomic instability 06/04/2013   Encephalopathy 05/21/2013   Bronchopneumonia 05/21/2013   Stroke (HCC) 05/21/2013   Acute kidney injury 05/09/2013   Disease characterized by destruction of skeletal muscle 05/09/2013   Leukocytosis 05/09/2013   Major depressive disorder, single episode, unspecified 05/09/2013   Transaminitis 05/09/2013    Patient Centered Plan: Patient is on the following Treatment Plan(s):  Depression   Referrals to Alternative Service(s): Referred to Alternative Service(s):   Place:   Date:   Time:    Referred to Alternative Service(s):   Place:   Date:   Time:    Referred to Alternative Service(s):   Place:   Date:   Time:    Referred to Alternative Service(s):   Place:   Date:   Time:      @BHCOLLABOFCARE @  Jazari Ober R Elvis Boot, LCAS

## 2024-03-25 NOTE — ED Notes (Signed)
 Pts mother brought in more pt belongings in belongings bag. New bag labeled and first bag label updated to reflect new bag count. Pt belongings in second bag include: insulin  pump supplies, laryngeal kit supplies, pants, shirts, underwear, and socks.

## 2024-03-25 NOTE — ED Notes (Signed)
Pharmacy called for med rec 

## 2024-03-25 NOTE — ED Notes (Signed)
 Pt and family member given a cup of water.

## 2024-03-25 NOTE — ED Notes (Signed)
 Dinner tray provided to pt

## 2024-03-26 ENCOUNTER — Encounter: Payer: Self-pay | Admitting: Psychiatry

## 2024-03-26 ENCOUNTER — Inpatient Hospital Stay
Admission: AD | Admit: 2024-03-26 | Discharge: 2024-04-04 | DRG: 885 | Disposition: A | Discharge status: Home / Self Care | Source: Intra-hospital | Attending: Psychiatry | Admitting: Psychiatry

## 2024-03-26 ENCOUNTER — Other Ambulatory Visit: Payer: Self-pay

## 2024-03-26 DIAGNOSIS — I10 Essential (primary) hypertension: Secondary | ICD-10-CM | POA: Diagnosis not present

## 2024-03-26 DIAGNOSIS — Z888 Allergy status to other drugs, medicaments and biological substances status: Secondary | ICD-10-CM

## 2024-03-26 DIAGNOSIS — Z8673 Personal history of transient ischemic attack (TIA), and cerebral infarction without residual deficits: Secondary | ICD-10-CM

## 2024-03-26 DIAGNOSIS — Z8249 Family history of ischemic heart disease and other diseases of the circulatory system: Secondary | ICD-10-CM

## 2024-03-26 DIAGNOSIS — Z87891 Personal history of nicotine dependence: Secondary | ICD-10-CM | POA: Diagnosis not present

## 2024-03-26 DIAGNOSIS — R45851 Suicidal ideations: Secondary | ICD-10-CM | POA: Diagnosis not present

## 2024-03-26 DIAGNOSIS — E785 Hyperlipidemia, unspecified: Secondary | ICD-10-CM | POA: Diagnosis not present

## 2024-03-26 DIAGNOSIS — J9620 Acute and chronic respiratory failure, unspecified whether with hypoxia or hypercapnia: Secondary | ICD-10-CM | POA: Diagnosis not present

## 2024-03-26 DIAGNOSIS — Z79899 Other long term (current) drug therapy: Secondary | ICD-10-CM

## 2024-03-26 DIAGNOSIS — Z9641 Presence of insulin pump (external) (internal): Secondary | ICD-10-CM | POA: Diagnosis present

## 2024-03-26 DIAGNOSIS — E1022 Type 1 diabetes mellitus with diabetic chronic kidney disease: Secondary | ICD-10-CM | POA: Diagnosis present

## 2024-03-26 DIAGNOSIS — E1069 Type 1 diabetes mellitus with other specified complication: Secondary | ICD-10-CM | POA: Diagnosis not present

## 2024-03-26 DIAGNOSIS — Z79890 Hormone replacement therapy (postmenopausal): Secondary | ICD-10-CM

## 2024-03-26 DIAGNOSIS — Z833 Family history of diabetes mellitus: Secondary | ICD-10-CM

## 2024-03-26 DIAGNOSIS — Z86718 Personal history of other venous thrombosis and embolism: Secondary | ICD-10-CM

## 2024-03-26 DIAGNOSIS — Z981 Arthrodesis status: Secondary | ICD-10-CM

## 2024-03-26 DIAGNOSIS — R7989 Other specified abnormal findings of blood chemistry: Secondary | ICD-10-CM

## 2024-03-26 DIAGNOSIS — Z89512 Acquired absence of left leg below knee: Secondary | ICD-10-CM

## 2024-03-26 DIAGNOSIS — Z9151 Personal history of suicidal behavior: Secondary | ICD-10-CM | POA: Diagnosis not present

## 2024-03-26 DIAGNOSIS — E669 Obesity, unspecified: Secondary | ICD-10-CM | POA: Diagnosis present

## 2024-03-26 DIAGNOSIS — F419 Anxiety disorder, unspecified: Secondary | ICD-10-CM | POA: Diagnosis present

## 2024-03-26 DIAGNOSIS — N182 Chronic kidney disease, stage 2 (mild): Secondary | ICD-10-CM | POA: Diagnosis not present

## 2024-03-26 DIAGNOSIS — B356 Tinea cruris: Secondary | ICD-10-CM | POA: Diagnosis not present

## 2024-03-26 DIAGNOSIS — F332 Major depressive disorder, recurrent severe without psychotic features: Principal | ICD-10-CM | POA: Diagnosis present

## 2024-03-26 DIAGNOSIS — Z9002 Acquired absence of larynx: Secondary | ICD-10-CM | POA: Diagnosis not present

## 2024-03-26 DIAGNOSIS — Z604 Social exclusion and rejection: Secondary | ICD-10-CM | POA: Diagnosis present

## 2024-03-26 DIAGNOSIS — F333 Major depressive disorder, recurrent, severe with psychotic symptoms: Secondary | ICD-10-CM | POA: Diagnosis not present

## 2024-03-26 DIAGNOSIS — F32A Depression, unspecified: Secondary | ICD-10-CM | POA: Diagnosis not present

## 2024-03-26 DIAGNOSIS — Z6833 Body mass index (BMI) 33.0-33.9, adult: Secondary | ICD-10-CM

## 2024-03-26 DIAGNOSIS — Z930 Tracheostomy status: Secondary | ICD-10-CM | POA: Diagnosis not present

## 2024-03-26 DIAGNOSIS — I129 Hypertensive chronic kidney disease with stage 1 through stage 4 chronic kidney disease, or unspecified chronic kidney disease: Secondary | ICD-10-CM | POA: Diagnosis present

## 2024-03-26 DIAGNOSIS — F429 Obsessive-compulsive disorder, unspecified: Secondary | ICD-10-CM | POA: Diagnosis present

## 2024-03-26 DIAGNOSIS — Z794 Long term (current) use of insulin: Secondary | ICD-10-CM | POA: Diagnosis not present

## 2024-03-26 DIAGNOSIS — E039 Hypothyroidism, unspecified: Secondary | ICD-10-CM | POA: Diagnosis present

## 2024-03-26 DIAGNOSIS — Z8262 Family history of osteoporosis: Secondary | ICD-10-CM

## 2024-03-26 DIAGNOSIS — R220 Localized swelling, mass and lump, head: Secondary | ICD-10-CM | POA: Diagnosis not present

## 2024-03-26 DIAGNOSIS — F329 Major depressive disorder, single episode, unspecified: Principal | ICD-10-CM | POA: Diagnosis present

## 2024-03-26 DIAGNOSIS — E1065 Type 1 diabetes mellitus with hyperglycemia: Secondary | ICD-10-CM | POA: Diagnosis not present

## 2024-03-26 DIAGNOSIS — I639 Cerebral infarction, unspecified: Secondary | ICD-10-CM | POA: Diagnosis not present

## 2024-03-26 DIAGNOSIS — Z7901 Long term (current) use of anticoagulants: Secondary | ICD-10-CM | POA: Diagnosis not present

## 2024-03-26 DIAGNOSIS — G4700 Insomnia, unspecified: Secondary | ICD-10-CM | POA: Diagnosis present

## 2024-03-26 DIAGNOSIS — R059 Cough, unspecified: Secondary | ICD-10-CM | POA: Diagnosis not present

## 2024-03-26 DIAGNOSIS — R918 Other nonspecific abnormal finding of lung field: Secondary | ICD-10-CM | POA: Diagnosis not present

## 2024-03-26 LAB — GLUCOSE, CAPILLARY: Glucose-Capillary: 203 mg/dL — ABNORMAL HIGH (ref 70–99)

## 2024-03-26 LAB — SARS CORONAVIRUS 2 BY RT PCR: SARS Coronavirus 2 by RT PCR: NEGATIVE

## 2024-03-26 LAB — CBG MONITORING, ED: Glucose-Capillary: 144 mg/dL — ABNORMAL HIGH (ref 70–99)

## 2024-03-26 MED ORDER — ROSUVASTATIN CALCIUM 10 MG PO TABS
20.0000 mg | ORAL_TABLET | Freq: Every day | ORAL | Status: DC
  Administered 2024-03-26 – 2024-04-03 (×9): 20 mg via ORAL
  Filled 2024-03-26 (×9): qty 2

## 2024-03-26 MED ORDER — INSULIN ASPART 100 UNIT/ML IJ SOLN: 7.0000 [IU] | Freq: Three times a day (TID) | INTRAMUSCULAR | Status: DC

## 2024-03-26 MED ORDER — ALUM & MAG HYDROXIDE-SIMETH 200-200-20 MG/5ML PO SUSP: 30.0000 mL | ORAL | Status: DC | PRN

## 2024-03-26 MED ORDER — ZOLPIDEM TARTRATE 5 MG PO TABS
5.0000 mg | ORAL_TABLET | Freq: Every evening | ORAL | Status: DC | PRN
  Administered 2024-03-26 – 2024-04-01 (×7): 5 mg via ORAL
  Filled 2024-03-26 (×7): qty 1

## 2024-03-26 MED ORDER — OLANZAPINE 5 MG PO TABS
10.0000 mg | ORAL_TABLET | Freq: Every day | ORAL | Status: DC
  Administered 2024-03-26 – 2024-03-30 (×5): 10 mg via ORAL
  Filled 2024-03-26 (×5): qty 2

## 2024-03-26 MED ORDER — LEVOTHYROXINE SODIUM 75 MCG PO TABS
75.0000 ug | ORAL_TABLET | Freq: Every day | ORAL | Status: DC
  Administered 2024-03-27 – 2024-04-04 (×9): 75 ug via ORAL
  Filled 2024-03-26 (×9): qty 1

## 2024-03-26 MED ORDER — ONDANSETRON 4 MG PO TBDP: 4.0000 mg | ORAL_TABLET | Freq: Three times a day (TID) | ORAL | Status: DC | PRN

## 2024-03-26 MED ORDER — ESCITALOPRAM OXALATE 10 MG PO TABS
10.0000 mg | ORAL_TABLET | Freq: Every day | ORAL | Status: DC
  Administered 2024-03-27 – 2024-03-28 (×2): 10 mg via ORAL
  Filled 2024-03-26 (×2): qty 1

## 2024-03-26 MED ORDER — CLONAZEPAM 1 MG PO TABS
1.0000 mg | ORAL_TABLET | Freq: Every day | ORAL | Status: DC | PRN
  Administered 2024-03-26 – 2024-04-03 (×9): 1 mg via ORAL
  Filled 2024-03-26 (×9): qty 1

## 2024-03-26 MED ORDER — OLANZAPINE 10 MG IM SOLR: 5.0000 mg | Freq: Three times a day (TID) | INTRAMUSCULAR | Status: DC | PRN

## 2024-03-26 MED ORDER — IRBESARTAN 150 MG PO TABS
300.0000 mg | ORAL_TABLET | Freq: Every day | ORAL | Status: DC
  Administered 2024-03-27 – 2024-03-30 (×4): 300 mg via ORAL
  Filled 2024-03-26 (×4): qty 2

## 2024-03-26 MED ORDER — AMLODIPINE BESYLATE 5 MG PO TABS
5.0000 mg | ORAL_TABLET | Freq: Every day | ORAL | Status: DC
  Administered 2024-03-27 – 2024-04-04 (×9): 5 mg via ORAL
  Filled 2024-03-26 (×9): qty 1

## 2024-03-26 MED ORDER — PANTOPRAZOLE SODIUM 40 MG PO TBEC
40.0000 mg | DELAYED_RELEASE_TABLET | Freq: Every day | ORAL | Status: DC
  Administered 2024-03-27 – 2024-04-04 (×9): 40 mg via ORAL
  Filled 2024-03-26 (×9): qty 1

## 2024-03-26 MED ORDER — INSULIN ASPART 100 UNIT/ML IJ SOLN
0.0000 [IU] | Freq: Every day | INTRAMUSCULAR | Status: DC
  Administered 2024-03-26: 2 [IU] via SUBCUTANEOUS
  Administered 2024-03-27: 3 [IU] via SUBCUTANEOUS
  Administered 2024-03-29: 2 [IU] via SUBCUTANEOUS
  Administered 2024-03-30: 3 [IU] via SUBCUTANEOUS
  Administered 2024-03-31: 2 [IU] via SUBCUTANEOUS
  Administered 2024-04-03: 4 [IU] via SUBCUTANEOUS
  Filled 2024-03-26: qty 2
  Filled 2024-03-26 (×2): qty 3
  Filled 2024-03-26: qty 2
  Filled 2024-03-26: qty 3
  Filled 2024-03-26: qty 4

## 2024-03-26 MED ORDER — METOPROLOL SUCCINATE ER 25 MG PO TB24
25.0000 mg | ORAL_TABLET | Freq: Every day | ORAL | Status: DC
  Administered 2024-03-27 – 2024-04-04 (×9): 25 mg via ORAL
  Filled 2024-03-26 (×9): qty 1

## 2024-03-26 MED ORDER — CHLORTHALIDONE 25 MG PO TABS
25.0000 mg | ORAL_TABLET | Freq: Every day | ORAL | Status: DC
  Administered 2024-03-27 – 2024-03-30 (×4): 25 mg via ORAL
  Filled 2024-03-26 (×4): qty 1

## 2024-03-26 MED ORDER — INSULIN ASPART 100 UNIT/ML IJ SOLN
7.0000 [IU] | Freq: Three times a day (TID) | INTRAMUSCULAR | Status: DC
  Administered 2024-03-27: 7 [IU] via SUBCUTANEOUS
  Filled 2024-03-26: qty 7

## 2024-03-26 MED ORDER — APIXABAN 5 MG PO TABS
5.0000 mg | ORAL_TABLET | Freq: Two times a day (BID) | ORAL | Status: DC
  Administered 2024-03-26 – 2024-04-04 (×18): 5 mg via ORAL
  Filled 2024-03-26 (×18): qty 1

## 2024-03-26 MED ORDER — INSULIN GLARGINE-YFGN 100 UNIT/ML ~~LOC~~ SOLN
20.0000 [IU] | Freq: Every day | SUBCUTANEOUS | Status: DC
  Filled 2024-03-26: qty 0.2

## 2024-03-26 MED ORDER — ACETAMINOPHEN 325 MG PO TABS
650.0000 mg | ORAL_TABLET | Freq: Four times a day (QID) | ORAL | Status: DC | PRN
  Administered 2024-03-26 – 2024-04-01 (×3): 650 mg via ORAL
  Filled 2024-03-26 (×3): qty 2

## 2024-03-26 MED ORDER — INSULIN ASPART 100 UNIT/ML IJ SOLN
0.0000 [IU] | Freq: Three times a day (TID) | INTRAMUSCULAR | Status: DC
  Administered 2024-03-27: 11 [IU] via SUBCUTANEOUS
  Administered 2024-03-27: 15 [IU] via SUBCUTANEOUS
  Administered 2024-03-28: 11 [IU] via SUBCUTANEOUS
  Administered 2024-03-29: 5 [IU] via SUBCUTANEOUS
  Administered 2024-03-29: 11 [IU] via SUBCUTANEOUS
  Administered 2024-03-29 – 2024-03-31 (×6): 3 [IU] via SUBCUTANEOUS
  Administered 2024-04-01: 15 [IU] via SUBCUTANEOUS
  Administered 2024-04-01 (×2): 3 [IU] via SUBCUTANEOUS
  Administered 2024-04-02 (×2): 2 [IU] via SUBCUTANEOUS
  Administered 2024-04-03 – 2024-04-04 (×2): 3 [IU] via SUBCUTANEOUS
  Filled 2024-03-26: qty 7
  Filled 2024-03-26: qty 2
  Filled 2024-03-26: qty 3
  Filled 2024-03-26: qty 1
  Filled 2024-03-26: qty 2
  Filled 2024-03-26: qty 1
  Filled 2024-03-26: qty 2

## 2024-03-26 MED ORDER — MAGNESIUM HYDROXIDE 400 MG/5ML PO SUSP
30.0000 mL | Freq: Every day | ORAL | Status: DC | PRN
Start: 2024-03-26 — End: 2024-04-04

## 2024-03-26 MED ORDER — INSULIN ASPART 100 UNIT/ML IJ SOLN: 0.0000 [IU] | Freq: Every day | INTRAMUSCULAR | Status: DC

## 2024-03-26 MED ORDER — INSULIN ASPART 100 UNIT/ML IJ SOLN: 0.0000 [IU] | Freq: Three times a day (TID) | INTRAMUSCULAR | Status: DC

## 2024-03-26 MED ORDER — OLANZAPINE 5 MG PO TBDP: 5.0000 mg | ORAL_TABLET | Freq: Three times a day (TID) | ORAL | Status: DC | PRN

## 2024-03-26 MED ORDER — BUPROPION HCL ER (XL) 300 MG PO TB24
300.0000 mg | ORAL_TABLET | Freq: Every day | ORAL | Status: DC
  Administered 2024-03-27 – 2024-03-28 (×2): 300 mg via ORAL
  Filled 2024-03-26 (×2): qty 1

## 2024-03-26 MED ORDER — INSULIN GLARGINE-YFGN 100 UNIT/ML ~~LOC~~ SOLN
20.0000 [IU] | Freq: Every day | SUBCUTANEOUS | Status: DC
  Administered 2024-03-26: 20 [IU] via SUBCUTANEOUS
  Filled 2024-03-26: qty 0.2

## 2024-03-26 NOTE — ED Provider Notes (Signed)
 Emergency Medicine Observation Re-evaluation Note  RIK WADEL is a 63 y.o. male, seen on rounds today.  Pt initially presented to the ED for complaints of Psychiatric Evaluation  Currently, the patient is is no acute distress. Denies any concerns at this time.  Physical Exam  Blood pressure 127/69, pulse 60, temperature 98.1 F (36.7 C), temperature source Oral, resp. rate 18, height 6' (1.829 m), weight 112.9 kg, SpO2 94%.  Physical Exam: General: No apparent distress Pulm: Normal WOB Neuro: Moving all extremities Psych: Resting comfortably     ED Course / MDM     I have reviewed the labs performed to date as well as medications administered while in observation.  Recent changes in the last 24 hours include: No acute events overnight.  Plan   Current plan: Patient awaiting psychiatric disposition.    Dmya Long, Josette SAILOR, DO 03/26/24 [phone]

## 2024-03-26 NOTE — ED Notes (Signed)
 Dinner tray provided to pt

## 2024-03-26 NOTE — ED Notes (Signed)
 Lunch tray provided to pt.

## 2024-03-26 NOTE — Inpatient Diabetes Management (Signed)
 Inpatient Diabetes Program Recommendations  AACE/ADA: New Consensus Statement on Inpatient Glycemic Control (2015)  Target Ranges:  Prepandial:   less than 140 mg/dL      Peak postprandial:   less than 180 mg/dL (1-2 hours)      Critically ill patients:  140 - 180 mg/dL    Latest Reference Range & Units 03/25/24 17:18  Glucose 70 - 99 mg/dL 857 (H)  (H): Data is abnormally high   Admit to [employer] Unit with Worsening Depression and Suicidality--Sent from OP  History: Type 1 diabetes  Home DM Meds: Insulin  Pump  Current Orders: None yet   Given pt admitted with Worsening Depression and Suicidality, Not appropriate to use his insulin  pump in the hospital setting  We need to convert pt to basal-bolus regimen and pt can restart Insulin  pump when he goes home  Recommend the following:  1. Start Semglee  20 units daily   Give the Semglee  and have pt remove his insulin  pump once the Semglee  has been given  2. Start Novolog  Moderate Correction Scale/ SSI (0-15 units) TID AC + HS  3. Start Novolog  7 units TID with meals for meal coverage: HOLD if pt NPO HOLD if pt eats <50% meals   3pm--Met w/ pt down in the ED.  Pump was on and running.  Reviewed pump settings and they match the ENDO notes from 01/08/2024 visit.  Pt stated he wanted to stay on his insulin  pump.  Explained to pt that hospital policy does not allow pts admitted with suicidal ideation to use their pumps in the hospital.  Reviewed the info with the ED MD (Dr. Dicky) and he referred me to Psych MD Dr. Jadapalle to determine if Psych would allow the pump.  Message to Dr. Jadapalle and she asked me to place basal/bolus insulin  orders and have pt remove insulin  pump prior to admission to Psych unit.  Orders reviewed with Dr. Jadapalle and RNs and orders entered in to computer per Dr. Gonzella permission.     ENDO: Dr. Cherilyn with [facility] Last Seen 01/08/2024 Humalog via the T slim control IQ pump Basal  rates Midnight = 0.9 8 AM = 0.8 8 PM = 0.7 TDD basal: 19.6 units  Bolus settings I/C: 3.5 ISF: 30 Target Glucose: 110 Active insulin  time: 5 hours  Total Daily dose approximately 80-100 units.   Per Dr. Cherilyn: His A1c today is 7.5. Based on review of his CGM, I will make no changes to his pump settings. I gave him instructions on how to bolus. I told him to download the T connect app to make bolusing easier. I also put his pump on sleep mode over night. I encouraged him to bolus ahead of meals and better lifestyle modifications.     --Will follow patient during hospitalization--  Adina Rudolpho Arrow RN, MSN, CDCES Diabetes Coordinator Inpatient Glycemic Control Team Team Pager: [phone] (8a-5p)

## 2024-03-26 NOTE — ED Notes (Signed)
 Pt given lunch tray.

## 2024-03-26 NOTE — Plan of Care (Signed)
  Problem: Education: Goal: Utilization of techniques to improve thought processes will improve Outcome: Not Progressing Goal: Knowledge of the prescribed therapeutic regimen will improve Outcome: Not Progressing   Problem: Coping: Goal: Coping ability will improve Outcome: Not Progressing Goal: Will verbalize feelings Outcome: Not Progressing   Problem: Coping: Goal: Coping ability will improve Outcome: Not Progressing   Problem: Self-Concept: Goal: Ability to disclose and discuss suicidal ideas will improve Outcome: Not Progressing Goal: Will verbalize positive feelings about self Outcome: Not Progressing Note: Patient is not on track and no change. Patient will maintain adherence and avoid flare triggers

## 2024-03-26 NOTE — ED Notes (Signed)
 Pt given breakfast tray

## 2024-03-26 NOTE — BH Assessment (Signed)
 Patient is to be admitted to [facility] on today 03/26/24 by Dr. Jadapalle.  Attending Physician will be Dr. Jadapalle.   Patient has been assigned to room L 31, by [facility] Charge Nurse Danika.    ER staff is aware of the admission: Olam, ER Secretary   Dr. Dicky, ER MD  Leontine, Patient's Nurse  Bascom, Patient Access.

## 2024-03-26 NOTE — Progress Notes (Signed)
 Called to patient bedside for larytube patient. Suction is hooked up and oxygen flowmeter in room. Because of behavioral status RN is keeping emergency box, ambu bag and all other supplies at nursing station. Spoke to house supervisor and patient RN at beside and both aware if needed respiratory is available.

## 2024-03-26 NOTE — ED Notes (Signed)
RN attempted to call report x2. 

## 2024-03-26 NOTE — Consult Note (Signed)
 [facility] Psychiatric Consult Initial  Patient Name: .[name]  MRN: [id number]  DOB: 1961-03-19  Consult Order details:  Orders (From admission, onward)     Start     Ordered   03/25/24 1757  CONSULT TO CALL ACT TEAM       Ordering Provider: Fernand Rossie HERO, MD  Provider:  (Not yet assigned)  Question:  Reason for Consult?  Answer:  Psych consult   03/25/24 1756   03/25/24 1757  IP CONSULT TO PSYCHIATRY       Ordering Provider: Fernand Rossie HERO, MD  Provider:  (Not yet assigned)  Question:  Reason for consult:  Answer:  Medication management   03/25/24 1756             Mode of Visit: Tele-visit Virtual Statement:TELE PSYCHIATRY ATTESTATION & CONSENT As the provider for this telehealth consult, I attest that I verified the patient's identity using two separate identifiers, introduced myself to the patient, provided my credentials, disclosed my location, and performed this encounter via a HIPAA-compliant, real-time, face-to-face, two-way, interactive audio and video platform and with the full consent and agreement of the patient (or guardian as applicable.) Patient physical location: [facility]. Telehealth provider physical location: home office in state of [place] .   Video start time:   Video end time:      Psychiatry Consult Evaluation  Service Date: March 26, 2024 LOS:  LOS: 0 days  Chief Complaint Depression, SI  Primary Psychiatric Diagnoses  MDD severe with out psychotic features 2.  SI   Assessment  [name] is a 63 y.o. male admitted: Presented to the ED for 03/25/2024  5:37 PM for for worsening depression and active suicidal ideation in the setting of functional decline and multiple failed attempts to be evaluated during recent ED visits. He carries the psychiatric diagnoses of Major Depressive Disorder, recurrent severe, and has a past medical history of diabetes mellitus, bilateral below-knee amputations, tracheostomy, and  difficulty adjusting to chronic medical disability.  His current presentation of severe depression with suicidal ideation ("I don't feel like moving on. I don't feel like being here") is most consistent with an acute exacerbation of Major Depressive Disorder with high suicide risk. He meets criteria for Major Depressive Disorder, recurrent severe with suicidal ideation based on depressed mood, hopelessness, decreased appetite, hypersomnia, impaired functioning, prior suicide attempt by overdose, and current active SI. Current outpatient psychotropic medications include bupropion , olanzapine , and escitalopram  (Lexapro ), and historically he has had a partial response to these medications. He was partially compliant with medications prior to admission as evidenced by report of continued use but worsening symptoms and lack of outpatient therapeutic support.   On initial examination, patient appears depressed, withdrawn, fatigued, with blunted affect; cooperative; endorsing SI without plan; denying HI/AVH/paranoia; judgment and insight impaired; physically limited due to bilateral amputations.   Diagnoses:  Active Hospital problems: Active Problems:   * No active hospital problems. *    Plan   ## Psychiatric Medication Recommendations:  Continue current regimen  ## Medical Decision Making Capacity: Not specifically addressed in this encounter  ## Disposition:-- We recommend inpatient psychiatric hospitalization when medically cleared. Patient is under voluntary admission status at this time; please IVC if attempts to leave hospital.  ## Behavioral / Environmental: - No specific recommendations at this time.     ## Safety and Observation Level:  - Based on my clinical evaluation, I estimate the patient to be at moderate risk of self  harm in the current setting. - At this time, we recommend  routine. This decision is based on my review of the chart including patient's history and current  presentation, interview of the patient, mental status examination, and consideration of suicide risk including evaluating suicidal ideation, plan, intent, suicidal or self-harm behaviors, risk factors, and protective factors. This judgment is based on our ability to directly address suicide risk, implement suicide prevention strategies, and develop a safety plan while the patient is in the clinical setting. Please contact our team if there is a concern that risk level has changed.  CSSR Risk Category:C-SSRS RISK CATEGORY: No Risk  Suicide Risk Assessment: Patient has following modifiable risk factors for suicide: active suicidal ideation, which we are addressing by inpatient admission. Patient has following non-modifiable or demographic risk factors for suicide: male gender Patient has the following protective factors against suicide: Supportive family  Thank you for this consult request. Recommendations have been communicated to the primary team.  We will recommend inpatient admission at this time.   Jonluke Cobbins, NP       History of Present Illness  Relevant Aspects of Hospital ED Course:  Admitted on 03/25/2024 for MDD severe without psychotic features.   Patient Report:  The patient is a 63 year old male presenting to the ED with worsening depression and suicidal ideation. He was sent by his outpatient psychiatrist, Dr. Coby, due to escalating depressive symptoms and statements suggesting danger to self. Patient reports several recent ED visits where he left without being evaluated due to prolonged wait times and inability to sit for extended periods because of medical limitations.  Patient states: "I don't feel like moving on. I don't feel like being here." He endorses suicidal ideation but denies plan or intent at this moment. He denies homicidal ideation, auditory hallucinations, visual hallucinations, and paranoia. He reports one prior suicide attempt by overdose in the  past.  He states his depression has significantly worsened over the last few days. He describes poor appetite, reporting he is "not eating much," and hypersomnia, sleeping "long hours." He denies engagement with a therapist.  Current psychiatric medications include bupropion , olanzapine , and escitalopram  (Lexapro ). He reports taking his medications but feels they are "not really helping right now."  Medical history is significant for bilateral below-knee amputations, tracheostomy, and diabetes mellitus, all of which contribute to physical and emotional distress. He reports having a difficult time adjusting to his amputations and physical limitations.  No substance use reported in this encounter. No current legal issues mentioned.  Psych ROS:  Depression: yes Anxiety:  yes Mania (lifetime and current): no Psychosis: (lifetime and current): no  Review of Systems  Constitutional: Negative.   HENT: Negative.    Eyes: Negative.   Respiratory: Negative.    Cardiovascular: Negative.   Gastrointestinal: Negative.   Genitourinary: Negative.   Musculoskeletal: Negative.   Skin: Negative.   Neurological: Negative.   Psychiatric/Behavioral:  Positive for depression and suicidal ideas.      Psychiatric and Social History  Psychiatric History:  Information collected from Patient and chart history  Prev Dx/Sx: Depression Current Psych Provider: Dr. Coby Home Meds (current): bupropion , olanzapine , and escitalopram  (Lexapro ) Previous Med Trials: not reported Therapy: denies  Prior Psych Hospitalization: yes  Prior Self Harm: yes Prior Violence: not reported  Family Psych History: not reported Family Hx suicide: not reported  Social History:  Developmental Hx: unknown Educational Hx: unknown Occupational Hx: unknown Legal Hx: not reported Living Situation: with parents Spiritual Hx: not  labled Access to weapons/lethal means: no   Substance History Alcohol: denies  Tobacco:  denies Illicit drugs: denies Prescription drug abuse: denies Rehab hx: denies  Exam Findings   Vital Signs:  Temp:  [97.9 F (36.6 C)-98.2 F (36.8 C)] 98.1 F (36.7 C) (11/19 0058) Pulse Rate:  [60-67] 60 (11/19 0058) Resp:  [17-18] 18 (11/19 0058) BP: (118-137)/(64-74) 127/69 (11/19 0058) SpO2:  [94 %-98 %] 94 % (11/19 0058) Weight:  [112.9 kg] 112.9 kg (11/18 1714) Blood pressure 127/69, pulse 60, temperature 98.1 F (36.7 C), temperature source Oral, resp. rate 18, height 6' (1.829 m), weight 112.9 kg, SpO2 94%. Body mass index is 33.77 kg/m.  Physical Exam HENT:     Head: Normocephalic.     Nose: Nose normal.     Mouth/Throat:     Pharynx: Oropharynx is clear.  Pulmonary:     Effort: Pulmonary effort is normal.  Musculoskeletal:     Cervical back: Normal range of motion.  Neurological:     Mental Status: He is alert.      Other History   These have been pulled in through the EMR, reviewed, and updated if appropriate.  Family History:  The patient's family history includes Diabetes in his mother; Hypertension in his father; Osteoporosis in his mother.  Medical History: Past Medical History:  Diagnosis Date   Depression    Diabetes (HCC)    Insulin  Pump   Diabetes mellitus type I (HCC)    Diabetes mellitus without complication (HCC)    GERD (gastroesophageal reflux disease)    H/O laryngectomy    Heel bone fracture    History of embolic stroke    Hyperlipidemia    Hypertension    Radicular pain of right lower extremity    Stroke (HCC)    Suicide attempt (HCC) 2014   damaged larynx - tracheostomy   Thyroid  disease     Surgical History: Past Surgical History:  Procedure Laterality Date   COLONOSCOPY WITH PROPOFOL  N/A 05/15/2018   Procedure: COLONOSCOPY WITH PROPOFOL ;  Surgeon: [name], Ladell POUR, MD;  Location: [facility] ENDOSCOPY;  Service: Gastroenterology;  Laterality: N/A;   ESOPHAGOGASTRODUODENOSCOPY N/A 05/15/2018   Procedure:  ESOPHAGOGASTRODUODENOSCOPY (EGD);  Surgeon: [name], Ladell POUR, MD;  Location: [facility] ENDOSCOPY;  Service: Gastroenterology;  Laterality: N/A;   FRACTURE SURGERY     Heel bone reconstruction Left    HERNIA REPAIR  02/2011   Umbilical hernia repair    LARYNGECTOMY     LOWER EXTREMITY ANGIOGRAPHY Left 01/31/2021   Procedure: LOWER EXTREMITY ANGIOGRAPHY;  Surgeon: Marea Selinda RAMAN, MD;  Location: [facility] INVASIVE CV LAB;  Service: Cardiovascular;  Laterality: Left;   LOWER EXTREMITY ANGIOGRAPHY Right 02/14/2021   Procedure: LOWER EXTREMITY ANGIOGRAPHY;  Surgeon: Marea Selinda RAMAN, MD;  Location: [facility] INVASIVE CV LAB;  Service: Cardiovascular;  Laterality: Right;   NECK SURGERY     fusion   SPINE SURGERY     TRACHEOSTOMY  2014   from SI attempt     Medications:   Current Facility-Administered Medications:    amLODipine  (NORVASC ) tablet 5 mg, 5 mg, Oral, Daily, Fernand Rossie HERO, MD   apixaban  (ELIQUIS ) tablet 5 mg, 5 mg, Oral, BID, Fernand Rossie HERO, MD, 5 mg at 03/26/24 9946   buPROPion  (WELLBUTRIN  XL) 24 hr tablet 300 mg, 300 mg, Oral, Daily, Fernand Rossie HERO, MD   chlorthalidone (HYGROTON) tablet 25 mg, 25 mg, Oral, Daily, Fernand Rossie HERO, MD   clonazePAM  (KLONOPIN ) tablet 1 mg, 1 mg, Oral, Daily PRN,  Fernand Rossie HERO, MD, 1 mg at 03/26/24 0053   escitalopram  (LEXAPRO ) tablet 10 mg, 10 mg, Oral, Daily, Fernand Rossie HERO, MD   irbesartan BAXTER) tablet 300 mg, 300 mg, Oral, Daily, Fernand Rossie HERO, MD   levothyroxine  (SYNTHROID ) tablet 75 mcg, 75 mcg, Oral, Q0600, Fernand Rossie HERO, MD   metoprolol  succinate (TOPROL -XL) 24 hr tablet 25 mg, 25 mg, Oral, Daily, Fernand Rossie HERO, MD   OLANZapine  (ZYPREXA ) tablet 10 mg, 10 mg, Oral, QHS, Fernand Rossie HERO, MD, 10 mg at 03/26/24 0053   ondansetron  (ZOFRAN -ODT) disintegrating tablet 4 mg, 4 mg, Oral, Q8H PRN, Fernand Rossie HERO, MD   pantoprazole  (PROTONIX ) EC tablet 40 mg, 40 mg, Oral, Daily, Fernand Rossie HERO, MD   rosuvastatin  (CRESTOR ) tablet 20 mg, 20 mg, Oral, QHS,  Fernand Rossie HERO, MD, 20 mg at 03/26/24 9947   zolpidem  (AMBIEN ) tablet 5 mg, 5 mg, Oral, QHS PRN, Fernand Rossie HERO, MD, 5 mg at 03/26/24 9946  Current Outpatient Medications:    amLODipine  (NORVASC ) 5 MG tablet, Take 1 tablet (5 mg total) by mouth daily., Disp: 30 tablet, Rfl: 1   apixaban  (ELIQUIS ) 5 MG TABS tablet, Take 1 tablet (5 mg total) by mouth 2 (two) times daily., Disp: 60 tablet, Rfl: 5   buPROPion  (WELLBUTRIN  XL) 300 MG 24 hr tablet, TAKE 1 TABLET(300 MG) BY MOUTH DAILY WITH BREAKFAST, Disp: 90 tablet, Rfl: 1   chlorthalidone (HYGROTON) 25 MG tablet, Take 25 mg by mouth daily., Disp: , Rfl:    clindamycin-benzoyl peroxide (BENZACLIN) gel, Apply 1 Application topically 2 (two) times daily., Disp: , Rfl:    clonazePAM  (KLONOPIN ) 1 MG tablet, Take 1 tablet (1 mg total) by mouth daily as needed for anxiety. Please limit use, Disp: 30 tablet, Rfl: 1   escitalopram  (LEXAPRO ) 10 MG tablet, Take 10 mg by mouth daily., Disp: , Rfl:    HUMALOG 100 UNIT/ML injection, Inject 20-120 Units into the skin daily. Uses with Insulin  Pump, Disp: , Rfl:    levothyroxine  (SYNTHROID ) 75 MCG tablet, TAKE 1 TABLET BY MOUTH DAILY AT 6 AM, Disp: 90 tablet, Rfl: 0   metoprolol  succinate (TOPROL -XL) 25 MG 24 hr tablet, TAKE 1 TABLET(25 MG) BY MOUTH EVERY DAY, Disp: , Rfl:    mometasone  (ELOCON ) 0.1 % cream, Apply to rash on arms and chest twice daily until improved., Disp: 45 g, Rfl: 1   OLANZapine  (ZYPREXA ) 10 MG tablet, Take 1 tablet (10 mg total) by mouth at bedtime. Has supplies, Disp: , Rfl:    pantoprazole  (PROTONIX ) 40 MG tablet, TAKE 1 TABLET(40 MG) BY MOUTH TWICE DAILY, Disp: , Rfl:    rosuvastatin  (CRESTOR ) 20 MG tablet, Take 20 mg by mouth at bedtime., Disp: , Rfl:    telmisartan (MICARDIS) 80 MG tablet, Take 80 mg by mouth daily., Disp: , Rfl:    zaleplon  (SONATA ) 10 MG capsule, Take 1 capsule (10 mg total) by mouth at bedtime as needed for sleep. Stop Zolpidem  ( Ambien ), Disp: 30 capsule, Rfl: 3    Continuous Glucose Sensor (DEXCOM G7 SENSOR) MISC, Use 1 each every 10 (ten) days, Disp: , Rfl:    LANTUS  SOLOSTAR 100 UNIT/ML Solostar Pen, Inject 20 Units into the skin at bedtime. (Patient not taking: Reported on 03/25/2024), Disp: , Rfl:    ondansetron  (ZOFRAN -ODT) 4 MG disintegrating tablet, Take 4 mg by mouth every 8 (eight) hours as needed for nausea or vomiting. (Patient not taking: Reported on 03/25/2024), Disp: , Rfl:    sodium hypochlorite (  DAKIN'S 1/4 STRENGTH) 0.125 % SOLN, , Disp: , Rfl:   Allergies: Allergies  Allergen Reactions   Pregabalin Other (See Comments), Anaphylaxis and Swelling    Gum Bleeding  Mouth/ gums swelling and bleeding  Mouth swelling and bleeding gums   Buspar [Buspirone]     Makes the patient flip out   Depakote  [Valproic Acid ]     Causes excessive drowsiness   Clopidogrel Rash   Gabapentin Itching and Rash    Mirayah Wren, NP

## 2024-03-26 NOTE — ED Notes (Signed)
 Vol / recommend inpatient psych hospitalization when medically cleared

## 2024-03-26 NOTE — ED Notes (Signed)
 Insulin  pump removed and placed with dexcom transmitter in pt belongings bag

## 2024-03-26 NOTE — ED Notes (Signed)
 Pt assisted to the bathroom. Pt requested smaller pants. This EDT assist pt change into new scrub pants. No further needs at this time.

## 2024-03-26 NOTE — Progress Notes (Signed)
 Called to ER to sx pt, upon arrival pt stated he needed his cannula cleaned. Cannula cleaned with NS and given back to pt where I assisted him with changing his velcro pads and suctioning. No respiratory noted during any of the following procedures.

## 2024-03-26 NOTE — Tx Team (Signed)
 Initial Treatment Plan 03/26/2024 11:56 PM [name] FMW:[id number]    PATIENT STRESSORS: Health problems   Other: death of wife in 04/10/2021, and family strain between patient and his four children.      PATIENT STRENGTHS: Motivation for treatment/growth  Supportive family/friends    PATIENT IDENTIFIED PROBLEMS:                      DISCHARGE CRITERIA:  Ability to meet basic life and health needs Adequate post-discharge living arrangements Improved stabilization in mood, thinking, and/or behavior  PRELIMINARY DISCHARGE PLAN: Outpatient therapy Return to previous living arrangement  PATIENT/FAMILY INVOLVEMENT: This treatment plan has been presented to and reviewed with the patient, [name], and/or family member, both of his parents Joel and [name]).  The patient and family have been given the opportunity to ask questions and make suggestions.  [name] [name], RN 03/26/2024, 11:56 PM

## 2024-03-26 NOTE — ED Notes (Signed)
 Pt awoke from sleep and reported feeling like his blood sugar was low. States blood sugar is in the 70s and reports he normally stays around 120-150s. Pt given orange juice, graham crackers, and peanut butter.

## 2024-03-27 DIAGNOSIS — F333 Major depressive disorder, recurrent, severe with psychotic symptoms: Secondary | ICD-10-CM

## 2024-03-27 DIAGNOSIS — F332 Major depressive disorder, recurrent severe without psychotic features: Secondary | ICD-10-CM | POA: Insufficient documentation

## 2024-03-27 LAB — GLUCOSE, CAPILLARY
Glucose-Capillary: 354 mg/dL — ABNORMAL HIGH (ref 70–99)
Glucose-Capillary: 311 mg/dL — ABNORMAL HIGH (ref 70–99)
Glucose-Capillary: 355 mg/dL — ABNORMAL HIGH (ref 70–99)
Glucose-Capillary: 406 mg/dL — ABNORMAL HIGH (ref 70–99)
Glucose-Capillary: 314 mg/dL — ABNORMAL HIGH (ref 70–99)
Glucose-Capillary: 65 mg/dL — ABNORMAL LOW (ref 70–99)
Glucose-Capillary: 233 mg/dL — ABNORMAL HIGH (ref 70–99)
Glucose-Capillary: 293 mg/dL — ABNORMAL HIGH (ref 70–99)
Glucose-Capillary: 413 mg/dL — ABNORMAL HIGH (ref 70–99)

## 2024-03-27 LAB — GLUCOSE, RANDOM: Glucose, Bld: 381 mg/dL — ABNORMAL HIGH (ref 70–99)

## 2024-03-27 LAB — HEMOGLOBIN A1C
Hgb A1c MFr Bld: 7.8 % — ABNORMAL HIGH (ref 4.8–5.6)
Mean Plasma Glucose: 177.16 mg/dL

## 2024-03-27 MED ORDER — INSULIN ASPART 100 UNIT/ML IJ SOLN
10.0000 [IU] | Freq: Three times a day (TID) | INTRAMUSCULAR | Status: DC
  Administered 2024-03-27 – 2024-03-28 (×2): 10 [IU] via SUBCUTANEOUS
  Filled 2024-03-27: qty 10

## 2024-03-27 MED ORDER — INSULIN ASPART 100 UNIT/ML IJ SOLN
5.0000 [IU] | Freq: Once | INTRAMUSCULAR | Status: AC
  Administered 2024-03-27: 5 [IU] via SUBCUTANEOUS

## 2024-03-27 MED ORDER — INSULIN ASPART 100 UNIT/ML IJ SOLN
5.0000 [IU] | Freq: Once | INTRAMUSCULAR | Status: DC
Start: 2024-03-27 — End: 2024-03-27

## 2024-03-27 MED ORDER — INSULIN GLARGINE-YFGN 100 UNIT/ML ~~LOC~~ SOLN
16.0000 [IU] | Freq: Two times a day (BID) | SUBCUTANEOUS | Status: DC
  Administered 2024-03-27 (×2): 16 [IU] via SUBCUTANEOUS
  Filled 2024-03-27 (×3): qty 0.16

## 2024-03-27 NOTE — Plan of Care (Signed)
  Problem: Coping: Goal: Coping ability will improve Outcome: Progressing Goal: Will verbalize feelings Outcome: Progressing   Problem: Self-Concept: Goal: Ability to disclose and discuss suicidal ideas will improve Outcome: Progressing Goal: Will verbalize positive feelings about self Outcome: Progressing Note: Patient is

## 2024-03-27 NOTE — Group Note (Signed)
 Date:  03/27/2024 Time:  10:53 AM  Group Topic/Focus:  Healthy Communication:   The focus of this group is to discuss communication, barriers to communication, as well as healthy ways to communicate with others.    Participation Level:  Did Not Attend   Arland Nutting 03/27/2024, 10:53 AM

## 2024-03-27 NOTE — Inpatient Diabetes Management (Signed)
 Inpatient Diabetes Program Recommendations  AACE/ADA: New Consensus Statement on Inpatient Glycemic Control (2015)  Target Ranges:  Prepandial:   less than 140 mg/dL      Peak postprandial:   less than 180 mg/dL (1-2 hours)      Critically ill patients:  140 - 180 mg/dL     Latest Reference Range & Units 03/26/24 17:23 03/26/24 20:37 03/27/24 01:38 03/27/24 05:33 03/27/24 07:52 03/27/24 10:29  Glucose-Capillary 70 - 99 mg/dL 855 (H)  20 units Semglee   Pt bolused self with insulin  pump for Dinner per MAR 203 (H)  2 units Novolog  354 (H) 311 (H)  5 units Novolog  355 (H)  22 units Novolog   406 (H)  (H): Data is abnormally high  Admit to [employer] Unit with Worsening Depression and Suicidality--Sent from OP   History: Type 1 diabetes   Home DM Meds: Insulin  Pump   Current Orders: Semglee  20 units daily at 1600     Novolog  Moderate Correction Scale/ SSI (0-15 units) TID AC + HS     Novolog  7 units TID with meals     Spoke w/ RN Garen on the [facility] unit.  Concern about CBG being 406 at 10:30am.  Conferred with the members of the diabetes coordinator team and reviewed CBGs and insulin  doses  Recommend the following:  1. Increase the Semglee  to 16 units BID (0.3 units/kg split dose)--Start now  2. Increase the Novolog  Meal Coverage to 10 units TID with meals  Make sure to check CBG around 11:30am and make sure pt gets SSI and MC    --Will follow patient during hospitalization--  Adina Rudolpho Arrow RN, MSN, CDCES Diabetes Coordinator Inpatient Glycemic Control Team Team Pager: [phone] (8a-5p)

## 2024-03-27 NOTE — BHH Suicide Risk Assessment (Signed)
 [facility] Admission Suicide Risk Assessment   Nursing information obtained from:  Patient Demographic factors:  Male, Divorced or widowed, Living alone Current Mental Status:  Self-harm thoughts Loss Factors:  Decline in physical health Historical Factors:  Prior suicide attempts Risk Reduction Factors:  Positive social support, Positive therapeutic relationship  Total Time spent with patient: 30 minutes Principal Problem: MDD (major depressive disorder) Diagnosis:  Principal Problem:   MDD (major depressive disorder)  Subjective Data: [name] is a 63 y.o. male admitted: Presented to the ED for 03/25/2024  5:37 PM for for worsening depression and active suicidal ideation in the setting of functional decline and multiple failed attempts to be evaluated during recent ED visits. He carries the psychiatric diagnoses of Major Depressive Disorder, recurrent severe, and has a past medical history of diabetes mellitus, bilateral below-knee amputations, tracheostomy, and difficulty adjusting to chronic medical disability. Patient is admitted to [facility] unit with Q15 min safety monitoring. Multidisciplinary team approach is offered. Medication management; group/milieu therapy is offered.   Continued Clinical Symptoms:    The Alcohol Use Disorders Identification Test, Guidelines for Use in Primary Care, Second Edition.  World Science Writer [facility]). Score between 0-7:  no or low risk or alcohol related problems. Score between 8-15:  moderate risk of alcohol related problems. Score between 16-19:  high risk of alcohol related problems. Score 20 or above:  warrants further diagnostic evaluation for alcohol dependence and treatment.   CLINICAL FACTORS:   Depression:   Hopelessness   Musculoskeletal: Strength & Muscle Tone: decreased Gait & Station: unsteady Patient leans: N/A  Psychiatric Specialty Exam:  Presentation  General Appearance:  Casual  Eye  Contact: Fleeting  Speech: Slow  Speech Volume: Decreased  Handedness:No data recorded  Mood and Affect  Mood: Anxious; Depressed  Affect: Depressed; Flat   Thought Process  Thought Processes: Coherent  Descriptions of Associations:Intact  Orientation:Full (Time, Place and Person)  Thought Content:Logical  History of Schizophrenia/Schizoaffective disorder:No  Duration of Psychotic Symptoms:No data recorded Hallucinations:Hallucinations: None  Ideas of Reference:None  Suicidal Thoughts:Suicidal Thoughts: Yes, Passive  Homicidal Thoughts:Homicidal Thoughts: No   Sensorium  Memory: Immediate Fair; Remote Fair  Judgment: Impaired  Insight: Shallow   Executive Functions  Concentration: Poor  Attention Span: Poor  Recall: [employer] of Knowledge: Fair  Language: Fair   Psychomotor Activity  Psychomotor Activity: Psychomotor Activity: Normal   Assets  Assets: Communication Skills; Desire for Improvement   Sleep  Sleep: Sleep: Fair    Physical Exam: Physical Exam ROS Blood pressure (!) 138/56, pulse (!) 54, temperature 97.6 F (36.4 C), resp. rate 18, height 6' (1.829 m), weight 112.9 kg, SpO2 95%. Body mass index is 33.77 kg/m.   COGNITIVE FEATURES THAT CONTRIBUTE TO RISK:  None    SUICIDE RISK:   Mild:  Suicidal ideation of limited frequency, intensity, duration, and specificity.  There are no identifiable plans, no associated intent, mild dysphoria and related symptoms, good self-control (both objective and subjective assessment), few other risk factors, and identifiable protective factors, including available and accessible social support.  PLAN OF CARE: Patient is admitted to [facility] psych unit with Q15 min safety monitoring. Multidisciplinary team approach is offered. Medication management; group/milieu therapy is offered.   I certify that inpatient services furnished can reasonably be expected to improve the patient's  condition.   Allyn Foil, MD 03/27/2024, 12:10 PM

## 2024-03-27 NOTE — Group Note (Signed)
 Recreation Therapy Group Note   Group Topic:Health and Wellness  Group Date: 03/27/2024 Start Time: 1400 End Time: 1450 Facilitators: Celestia Jeoffrey BRAVO, LRT, CTRS Location: Courtyard  Group Description: Outdoor Recreation. Patients had the option to play corn hole, ring toss, UNO, or listening to music while outside in the courtyard getting fresh air and sunlight. Patients helped water and prune the raised garden beds. LRT and patients discussed things that they enjoy doing in their free time outside of the hospital. LRT encouraged patients to drink water after being active and getting their heart rate up.   Goal Area(s) Addressed: Patient will identify leisure interests.  Patient will practice healthy decision making. Patient will engage in recreation activity.   Affect/Mood: N/A   Participation Level: Did not attend    Clinical Observations/Individualized Feedback: Patient did not attend group.   Plan: Continue to engage patient in RT group sessions 2-3x/week.   Jeoffrey BRAVO Celestia, LRT, CTRS 03/27/2024 4:31 PM

## 2024-03-27 NOTE — Group Note (Signed)
 Date:  03/27/2024 Time:  4:15 PM  Group Topic/Focus:  Developing a Wellness Toolbox:   The focus of this group is to help patients develop a wellness toolbox with skills and strategies to promote recovery upon discharge. Overcoming Stress:   The focus of this group is to define stress and help patients assess their triggers.    Participation Level:  Did Not Attend  Participation Quality:    Affect:    Cognitive:    Insight:   Engagement in Group:    Modes of Intervention:    Additional Comments:    Garen CINDERELLA Daring 03/27/2024, 4:15 PM

## 2024-03-27 NOTE — Progress Notes (Signed)
 Admission Note:  Patient arrived onto the unit at 2005, via wheelchair transport. Patient transferred from [facility] ED for voicing passive suicidal ideations as a result of worsening depression. Patient is a 63 y.o. CM, with an extensive psychiatric and medical history. AxOx4. Patient has had multiple inpatient psychiatric admissions and received outpatient services with Dr. Virginia, a psychiatrist who advised him to come to [facility] health for psychiatric stabilization. Patient has a medical history of insulin  dependent DM, left leg below the knee amputation, laryngectomy, embolic stroke, hypertension, hypothyroidism, and GERD. Arrival vital signs stable. Blood glucose 203, received SSI per order of 2u NovoLog . Consumed evening snack with peers in the day area. Woke up in the middle of night and requested accu-check, noted to be 354. Consulted the on-call, patient received an OTO of 5u of NovoLog . Blood glucose re-checked noted to be 311. Patient rested in bed throughout the remainder of the night. Skin assessment completed. Patient noted to have an old healing scab under his left residual limb. Patient utilizes the suction machine PRN, independently while at home. While on the unit patient suctioned twice this shift, per request. Compliant with scheduled medications. Denied AH/VH and SI/HI at this time, contracted for safety with this clinical research associate.  Patient oriented to the unit and daily expectations. Personal belongings inventoried and stored in the appropriate places on the unit. Patient uses a wheelchair to assist with ambulation on the unit.

## 2024-03-27 NOTE — Progress Notes (Addendum)
 SI/HI/AVH: denies all   Behavior/Mood: appropriate/depressed   Interaction/Group attendance: assertive/no groups   Medication/PRNs: complaint/none    Pain: denies  Other: Nursing did not witness a suctioning episode today for humidified trach. Also, pt's bg has been up and down since removal of insulin  pump prior to admission

## 2024-03-27 NOTE — H&P (Signed)
 Psychiatric Admission Assessment Adult  Patient Identification: [name] MRN:  [id number] Date of Evaluation:  03/27/2024 Chief Complaint:  MDD (major depressive disorder) [F32.9]  ED Psych Consult:The patient is a 63 year old male presenting to the ED with worsening depression and suicidal ideation. He was sent by his outpatient psychiatrist, Dr. Coby, due to escalating depressive symptoms and statements suggesting danger to self. Patient reports several recent ED visits where he left without being evaluated due to prolonged wait times and inability to sit for extended periods because of medical limitations.  Patient states: "I don't feel like moving on. I don't feel like being here." He endorses suicidal ideation but denies plan or intent at this moment. He denies homicidal ideation, auditory hallucinations, visual hallucinations, and paranoia. He reports one prior suicide attempt by overdose in the past.  He states his depression has significantly worsened over the last few days. He describes poor appetite, reporting he is "not eating much," and hypersomnia, sleeping "long hours." He denies engagement with a therapist.  History of Present Illness: [name] is a 63 y.o. male admitted: Presented to the ED for 03/25/2024  5:37 PM for for worsening depression and active suicidal ideation in the setting of functional decline and multiple failed attempts to be evaluated during recent ED visits. He carries the psychiatric diagnoses of Major Depressive Disorder, recurrent severe, and has a past medical history of diabetes mellitus, bilateral below-knee amputations, tracheostomy, and difficulty adjusting to chronic medical disability. Patient is admitted to [facility] unit with Q15 min safety monitoring. Multidisciplinary team approach is offered. Medication management; group/milieu therapy is offered.   On admission patient insulin  pump has been discontinued due to safety concerns and is  switched to oral insulin /bolus regimen.  Today morning patient is noted to be resting in bed.  He is unable to stay awake to engage in the interview.  He informs the provider that he feels like because his blood sugars are all over the place.  His nursing did inform the provider that his blood sugars are above 400.  Diabetic educator is working on adjusting the insulin  regimen.  Patient reports feeling nauseous.  Provider came back later to talk to the patient and he continues to feel lethargic and nauseous.  He did deny SI/HI/plan.  He did acknowledge feeling depressed due to his ongoing medical problems and currently feeling sick because of high blood sugars is not helping with his mood.  He denies auditory/visual hallucinations and reports feeling anxious about his hospital stay.  Provider educated him about hospitalist checking on the patient along with the diabetic educator and if team feels that he cannot be maintained on the psychiatric unit he will be transferred to the medical floor.  Patient expressed his understanding. Total Time spent with patient: 1 hour Sleep  Sleep:Sleep: Fair  Past Psychiatric History:  Psychiatric History:  Information collected from patient/chart  Prev Dx/Sx: Depression Current Psych Provider: Dr. Coby Home Meds (current): bupropion , olanzapine , and escitalopram  (Lexapro ) Previous Med Trials: not reported Therapy: denies   Prior Psych Hospitalization: yes  Prior Self Harm: yes Prior Violence: not reported   Family Psych History: not reported Family Hx suicide: not reported   Social History:  Developmental Hx: unknown Educational Hx: unknown Occupational Hx: unknown Legal Hx: not reported Living Situation: with parents Spiritual Hx: not labled Access to weapons/lethal means: no    Substance History Alcohol: denies  Tobacco: denies Illicit drugs: denies Prescription drug abuse: denies Rehab hx: denies Is the  patient at risk to self? Yes.    Has  the patient been a risk to self in the past 6 months? No.  Has the patient been a risk to self within the distant past? No.  Is the patient a risk to others? No.  Has the patient been a risk to others in the past 6 months? No.  Has the patient been a risk to others within the distant past? No.   Columbia Scale:  Flowsheet Row Admission (Current) from 03/26/2024 in [facility] Most recent reading at 03/27/2024  2:00 AM ED from 03/25/2024 in [facility] Emergency Department at [facility] Most recent reading at 03/25/2024  5:15 PM Office Visit from 03/25/2024 in [facility] Most recent reading at 03/25/2024  1:30 PM  C-SSRS RISK CATEGORY Error: Q3, 4, or 5 should not be populated when Q2 is No No Risk Moderate Risk     Past Medical History:  Past Medical History:  Diagnosis Date   Depression    Diabetes (HCC)    Insulin  Pump   Diabetes mellitus type I (HCC)    Diabetes mellitus without complication (HCC)    GERD (gastroesophageal reflux disease)    H/O laryngectomy    Heel bone fracture    History of embolic stroke    Hyperlipidemia    Hypertension    Radicular pain of right lower extremity    Stroke [facility])    Suicide attempt [facility]) 2014   damaged larynx - tracheostomy   Thyroid  disease     Past Surgical History:  Procedure Laterality Date   COLONOSCOPY WITH PROPOFOL  N/A 05/15/2018   Procedure: COLONOSCOPY WITH PROPOFOL ;  Surgeon: [name], Ladell POUR, MD;  Location: [facility] ENDOSCOPY;  Service: Gastroenterology;  Laterality: N/A;   ESOPHAGOGASTRODUODENOSCOPY N/A 05/15/2018   Procedure: ESOPHAGOGASTRODUODENOSCOPY (EGD);  Surgeon: [name], Ladell POUR, MD;  Location: [facility] ENDOSCOPY;  Service: Gastroenterology;  Laterality: N/A;   FRACTURE SURGERY     Heel bone reconstruction Left    HERNIA REPAIR  02/2011   Umbilical hernia repair    LARYNGECTOMY     LOWER EXTREMITY ANGIOGRAPHY Left 01/31/2021   Procedure: LOWER  EXTREMITY ANGIOGRAPHY;  Surgeon: Marea Selinda RAMAN, MD;  Location: [facility] INVASIVE CV LAB;  Service: Cardiovascular;  Laterality: Left;   LOWER EXTREMITY ANGIOGRAPHY Right 02/14/2021   Procedure: LOWER EXTREMITY ANGIOGRAPHY;  Surgeon: Marea Selinda RAMAN, MD;  Location: [facility] INVASIVE CV LAB;  Service: Cardiovascular;  Laterality: Right;   NECK SURGERY     fusion   SPINE SURGERY     TRACHEOSTOMY  2014   from SI attempt   Family History:  Family History  Problem Relation Age of Onset   Osteoporosis Mother    Diabetes Mother    Hypertension Father    Mental illness Neg Hx     Social History:  Social History   Substance and Sexual Activity  Alcohol Use Yes   Alcohol/week: 2.0 standard drinks of alcohol   Types: 2 Shots of liquor per week     Social History   Substance and Sexual Activity  Drug Use No   Comment: Pt denied; UDS not available      Allergies:   Allergies  Allergen Reactions   Pregabalin Other (See Comments), Anaphylaxis and Swelling    Gum Bleeding  Mouth/ gums swelling and bleeding  Mouth swelling and bleeding gums   Buspar [Buspirone]     Makes the patient flip out   Depakote  [Valproic Acid ]  Causes excessive drowsiness   Clopidogrel Rash   Gabapentin Itching and Rash   Lab Results:  Results for orders placed or performed during the hospital encounter of 03/26/24 (from the past 48 hours)  Hemoglobin A1c     Status: Abnormal   Collection Time: 03/25/24  5:18 PM  Result Value Ref Range   Hgb A1c MFr Bld 7.8 (H) 4.8 - 5.6 %    Comment: (NOTE) Diagnosis of Diabetes The following HbA1c ranges recommended by the American Diabetes Association (ADA) may be used as an aid in the diagnosis of diabetes mellitus.  Hemoglobin             Suggested A1C NGSP%              Diagnosis  <5.7                   Non Diabetic  5.7-6.4                Pre-Diabetic  >6.4                   Diabetic  <7.0                   Glycemic control for                        adults with diabetes.     Mean Plasma Glucose 177.16 mg/dL    Comment: Performed at [facility], [address], [place], [place] [address]  Glucose, capillary     Status: Abnormal   Collection Time: 03/26/24  8:37 PM  Result Value Ref Range   Glucose-Capillary 203 (H) 70 - 99 mg/dL    Comment: Glucose reference range applies only to samples taken after fasting for at least 8 hours.  Glucose, capillary     Status: Abnormal   Collection Time: 03/27/24  1:38 AM  Result Value Ref Range   Glucose-Capillary 354 (H) 70 - 99 mg/dL    Comment: Glucose reference range applies only to samples taken after fasting for at least 8 hours.  Glucose, capillary     Status: Abnormal   Collection Time: 03/27/24  5:33 AM  Result Value Ref Range   Glucose-Capillary 311 (H) 70 - 99 mg/dL    Comment: Glucose reference range applies only to samples taken after fasting for at least 8 hours.  Glucose, capillary     Status: Abnormal   Collection Time: 03/27/24  7:52 AM  Result Value Ref Range   Glucose-Capillary 355 (H) 70 - 99 mg/dL    Comment: Glucose reference range applies only to samples taken after fasting for at least 8 hours.  Glucose, capillary     Status: Abnormal   Collection Time: 03/27/24 10:29 AM  Result Value Ref Range   Glucose-Capillary 406 (H) 70 - 99 mg/dL    Comment: Glucose reference range applies only to samples taken after fasting for at least 8 hours.  Glucose, random     Status: Abnormal   Collection Time: 03/27/24 10:55 AM  Result Value Ref Range   Glucose, Bld 381 (H) 70 - 99 mg/dL    Comment: Glucose reference range applies only to samples taken after fasting for at least 8 hours. Performed at [facility], [address] [place], [place] [address]   Glucose, capillary     Status: Abnormal   Collection Time: 03/27/24 11:47 AM  Result Value Ref Range  Glucose-Capillary 314 (H) 70 - 99 mg/dL    Comment: Glucose reference range applies only to samples taken  after fasting for at least 8 hours.    Blood Alcohol level:  Lab Results  Component Value Date   [facility] <15 03/25/2024   ETH <10 01/21/2019    Metabolic Disorder Labs:  Lab Results  Component Value Date   HGBA1C 7.8 (H) 03/25/2024   MPG 177.16 03/25/2024   MPG 165.68 10/28/2022   No results found for: PROLACTIN Lab Results  Component Value Date   CHOL 161 09/15/2016   TRIG 190 (H) 09/15/2016   HDL 41 09/15/2016   CHOLHDL 3.9 09/15/2016   VLDL 38 09/15/2016   LDLCALC 82 09/15/2016    Current Medications: Current Facility-Administered Medications  Medication Dose Route Frequency Provider Last Rate Last Admin   acetaminophen  (TYLENOL ) tablet 650 mg  650 mg Oral Q6H PRN Smith, Annie B, NP   650 mg at 03/26/24 2101   alum & mag hydroxide-simeth (MAALOX/MYLANTA) 200-200-20 MG/5ML suspension 30 mL  30 mL Oral Q4H PRN Smith, Annie B, NP       amLODipine  (NORVASC ) tablet 5 mg  5 mg Oral Daily Smith, Annie B, NP   5 mg at 03/27/24 9040   apixaban  (ELIQUIS ) tablet 5 mg  5 mg Oral BID Smith, Annie B, NP   5 mg at 03/27/24 9040   buPROPion  (WELLBUTRIN  XL) 24 hr tablet 300 mg  300 mg Oral Daily Smith, Annie B, NP   300 mg at 03/27/24 0959   chlorthalidone (HYGROTON) tablet 25 mg  25 mg Oral Daily Smith, Annie B, NP   25 mg at 03/27/24 9040   clonazePAM  (KLONOPIN ) tablet 1 mg  1 mg Oral Daily PRN Smith, Annie B, NP   1 mg at 03/26/24 2147   escitalopram  (LEXAPRO ) tablet 10 mg  10 mg Oral Daily Smith, Annie B, NP   10 mg at 03/27/24 9040   insulin  aspart (novoLOG ) injection 0-15 Units  0-15 Units Subcutaneous TID WC Smith, Annie B, NP   11 Units at 03/27/24 1205   insulin  aspart (novoLOG ) injection 0-5 Units  0-5 Units Subcutaneous QHS Smith, Annie B, NP   2 Units at 03/26/24 2100   insulin  aspart (novoLOG ) injection 10 Units  10 Units Subcutaneous TID WC Calogero Geisen, MD   10 Units at 03/27/24 1205   insulin  glargine-yfgn (SEMGLEE ) injection 16 Units  16 Units Subcutaneous BID Jabin Tapp,  Cesare Sumlin, MD   16 Units at 03/27/24 1136   irbesartan (AVAPRO) tablet 300 mg  300 mg Oral Daily Smith, Annie B, NP   300 mg at 03/27/24 9040   levothyroxine  (SYNTHROID ) tablet 75 mcg  75 mcg Oral Q0600 Smith, Annie B, NP   75 mcg at 03/27/24 0718   magnesium  hydroxide (MILK OF MAGNESIA) suspension 30 mL  30 mL Oral Daily PRN Smith, Annie B, NP       metoprolol  succinate (TOPROL -XL) 24 hr tablet 25 mg  25 mg Oral Daily Smith, Annie B, NP   25 mg at 03/27/24 9040   OLANZapine  (ZYPREXA ) injection 5 mg  5 mg Intramuscular TID PRN Smith, Annie B, NP       OLANZapine  (ZYPREXA ) tablet 10 mg  10 mg Oral QHS Smith, Annie B, NP   10 mg at 03/26/24 2102   OLANZapine  zydis (ZYPREXA ) disintegrating tablet 5 mg  5 mg Oral TID PRN Smith, Annie B, NP       ondansetron  (ZOFRAN -ODT) disintegrating tablet 4  mg  4 mg Oral Q8H PRN Smith, Annie B, NP       pantoprazole  (PROTONIX ) EC tablet 40 mg  40 mg Oral Daily Smith, Annie B, NP   40 mg at 03/27/24 0800   rosuvastatin  (CRESTOR ) tablet 20 mg  20 mg Oral QHS Smith, Annie B, NP   20 mg at 03/26/24 2101   zolpidem  (AMBIEN ) tablet 5 mg  5 mg Oral QHS PRN Smith, Annie B, NP   5 mg at 03/26/24 2102   PTA Medications: Medications Prior to Admission  Medication Sig Dispense Refill Last Dose/Taking   amLODipine  (NORVASC ) 5 MG tablet Take 1 tablet (5 mg total) by mouth daily. 30 tablet 1    apixaban  (ELIQUIS ) 5 MG TABS tablet Take 1 tablet (5 mg total) by mouth 2 (two) times daily. 60 tablet 5    buPROPion  (WELLBUTRIN  XL) 300 MG 24 hr tablet TAKE 1 TABLET(300 MG) BY MOUTH DAILY WITH BREAKFAST 90 tablet 1    chlorthalidone (HYGROTON) 25 MG tablet Take 25 mg by mouth daily.      clindamycin-benzoyl peroxide (BENZACLIN) gel Apply 1 Application topically 2 (two) times daily.      clonazePAM  (KLONOPIN ) 1 MG tablet Take 1 tablet (1 mg total) by mouth daily as needed for anxiety. Please limit use 30 tablet 1    Continuous Glucose Sensor (DEXCOM G7 SENSOR) MISC Use 1 each every 10  (ten) days      escitalopram  (LEXAPRO ) 10 MG tablet Take 10 mg by mouth daily.      HUMALOG 100 UNIT/ML injection Inject 20-120 Units into the skin daily. Uses with Insulin  Pump      LANTUS  SOLOSTAR 100 UNIT/ML Solostar Pen Inject 20 Units into the skin at bedtime. (Patient not taking: Reported on 03/25/2024)      levothyroxine  (SYNTHROID ) 75 MCG tablet TAKE 1 TABLET BY MOUTH DAILY AT 6 AM 90 tablet 0    metoprolol  succinate (TOPROL -XL) 25 MG 24 hr tablet TAKE 1 TABLET(25 MG) BY MOUTH EVERY DAY      mometasone  (ELOCON ) 0.1 % cream Apply to rash on arms and chest twice daily until improved. 45 g 1    OLANZapine  (ZYPREXA ) 10 MG tablet Take 1 tablet (10 mg total) by mouth at bedtime. Has supplies      ondansetron  (ZOFRAN -ODT) 4 MG disintegrating tablet Take 4 mg by mouth every 8 (eight) hours as needed for nausea or vomiting. (Patient not taking: Reported on 03/25/2024)      pantoprazole  (PROTONIX ) 40 MG tablet TAKE 1 TABLET(40 MG) BY MOUTH TWICE DAILY      rosuvastatin  (CRESTOR ) 20 MG tablet Take 20 mg by mouth at bedtime.      sodium hypochlorite (DAKIN'S 1/4 STRENGTH) 0.125 % SOLN  (Patient not taking: Reported on 03/25/2024)      telmisartan (MICARDIS) 80 MG tablet Take 80 mg by mouth daily.      zaleplon  (SONATA ) 10 MG capsule Take 1 capsule (10 mg total) by mouth at bedtime as needed for sleep. Stop Zolpidem  ( Ambien ) 30 capsule 3     Psychiatric Specialty Exam:  Presentation  General Appearance:  Casual  Eye Contact: Fleeting  Speech: Slow  Speech Volume: Decreased    Mood and Affect  Mood: Anxious; Depressed  Affect: Depressed; Flat   Thought Process  Thought Processes: Coherent  Descriptions of Associations:Intact  Orientation:Full (Time, Place and Person)  Thought Content:Logical  Hallucinations:Hallucinations: None  Ideas of Reference:None  Suicidal Thoughts:Suicidal Thoughts: Yes, Passive  Homicidal  Thoughts:Homicidal Thoughts: No   Sensorium   Memory: Immediate Fair; Remote Fair  Judgment: Impaired  Insight: Shallow   Executive Functions  Concentration: Poor  Attention Span: Poor  Recall: [employer] of Knowledge: Fair  Language: Fair   Psychomotor Activity  Psychomotor Activity: Psychomotor Activity: Normal   Assets  Assets: Communication Skills; Desire for Improvement    Musculoskeletal: Strength & Muscle Tone: within normal limits Gait & Station: unsteady  Physical Exam: Physical Exam Vitals and nursing note reviewed.  HENT:     Head: Normocephalic.     Nose: Nose normal.     Mouth/Throat:     Mouth: Mucous membranes are moist.  Pulmonary:     Effort: Pulmonary effort is normal.  Abdominal:     General: Abdomen is flat.  Neurological:     Mental Status: He is alert.    Review of Systems  Constitutional:  Positive for malaise/fatigue.  HENT: Negative.    Eyes: Negative.   Respiratory: Negative.    Cardiovascular: Negative.   Gastrointestinal:  Positive for nausea.  Skin: Negative.    Blood pressure (!) 138/56, pulse (!) 54, temperature 97.6 F (36.4 C), resp. rate 18, height 6' (1.829 m), weight 112.9 kg, SpO2 95%. Body mass index is 33.77 kg/m.  Principal Diagnosis: MDD (major depressive disorder) Diagnosis:  Active Problems:   MDD (major depressive disorder), recurrent severe, without psychosis (HCC)   Clinical Decision Making: Patient currently admitted with worsening depression and passive suicidal ideation in the context of multiple medical problems.  Patient will be monitored closely for safety  Treatment Plan Summary:  Safety and Monitoring:             -- Voluntary admission to inpatient psychiatric unit for safety, stabilization and treatment             -- Daily contact with patient to assess and evaluate symptoms and progress in treatment             -- Patient's case to be discussed in multi-disciplinary team meeting             -- Observation Level: q15  minute checks             -- Vital signs:  q12 hours             -- Precautions: suicide, elopement, and assault   2. Psychiatric Diagnoses and Treatment:              Wellbutrin  XL 300 mg daily Klonopin  1mg  daily PRN Lexapro  10 mg daily  zaleplon  10 mg qhs   -- The risks/benefits/side-effects/alternatives to this medication were discussed in detail with the patient and time was given for questions. The patient consents to medication trial.                -- Metabolic profile and EKG monitoring obtained while on an atypical antipsychotic (BMI: Lipid Panel: HbgA1c: QTc:)              -- Encouraged patient to participate in unit milieu and in scheduled group therapies                            3. Medical Issues Being Addressed:      4. Discharge Planning:              -- Social work and case management to assist with discharge planning and identification of hospital follow-up needs prior  to discharge             -- Estimated LOS: 5-7 days             -- Discharge Concerns: Need to establish a safety plan; Medication compliance and effectiveness             -- Discharge Goals: Return home with outpatient referrals follow ups  Physician Treatment Plan for Primary Diagnosis: MDD (major depressive disorder) Long Term Goal(s): Improvement in symptoms so as ready for discharge  Short Term Goals: Ability to identify changes in lifestyle to reduce recurrence of condition will improve, Ability to verbalize feelings will improve, Ability to disclose and discuss suicidal ideas, Ability to demonstrate self-control will improve, and Ability to identify and develop effective coping behaviors will improve  Physician Treatment Plan for Secondary Diagnosis: Active Problems:   MDD (major depressive disorder), recurrent severe, without psychosis (HCC)  Long Term Goal(s): Improvement in symptoms so as ready for discharge  Short Term Goals: Ability to identify changes in lifestyle to reduce recurrence  of condition will improve, Ability to verbalize feelings will improve, Ability to disclose and discuss suicidal ideas, Ability to demonstrate self-control will improve, and Ability to identify and develop effective coping behaviors will improve  I certify that inpatient services furnished can reasonably be expected to improve the patient's condition.    Zalen Sequeira, MD 11/20/202512:14 PM

## 2024-03-27 NOTE — Progress Notes (Signed)
   03/26/24 2030  Psych Admission Type (Psych Patients Only)  Admission Status Voluntary  Psychosocial Assessment  Patient Complaints Depression;Hopelessness;Loneliness;Self-harm thoughts  Eye Contact Fair  Facial Expression Animated  Affect Appropriate to circumstance  Speech Logical/coherent  Interaction Assertive;Demanding  Motor Activity Restless  Appearance/Hygiene Unremarkable;In scrubs  Behavior Characteristics Cooperative;Appropriate to situation  Mood Anxious  Thought Process  Coherency WDL  Content WDL  Delusions None reported or observed  Perception WDL  Hallucination None reported or observed  Judgment Impaired  Confusion None  Danger to Self  Current suicidal ideation? Denies  Danger to Others  Danger to Others None reported or observed

## 2024-03-27 NOTE — Plan of Care (Signed)
  Problem: Coping: Goal: Coping ability will improve Outcome: Progressing Goal: Will verbalize feelings Outcome: Progressing   Problem: Self-Concept: Goal: Ability to disclose and discuss suicidal ideas will improve 03/27/2024 1342 by Delores Garen GRADE, RN Outcome: Progressing 03/27/2024 1331 by Delores Garen GRADE, RN Outcome: Progressing Goal: Will verbalize positive feelings about self 03/27/2024 1342 by Delores Garen GRADE, RN Outcome: Progressing Note:   03/27/2024 1331 by Delores Garen GRADE, RN Outcome: Progressing Note: Patient is

## 2024-03-27 NOTE — Consult Note (Signed)
 Initial Consultation Note   Patient: [name] FMW:[id number] DOB: 04-Apr-1961 PCP: [name], MD DOA: 03/26/2024 DOS: the patient was seen and examined on 03/27/2024 Primary service: Donnelly Mellow, MD  Referring physician: Dr. Donnelly Reason for consult: Management diabetes   Assessment and Plan: Uncontrolled type 1 diabetes with hyperglycemia. Patient was a insulin  pump, was removed due to concern of suicidal ideation.  Will start long-acting insulin , scheduled insulin  and a sliding scale insulin .  Will monitor glucose and adjust dose as needed.  Status post tracheostomy. Condition stable.  Essential hypertension. Continue home medicines.  History of CVA. No recurrence.  History of DVT. Continue anticoagulation.  Depression suicidal ideation OCD. Managed by psychiatry.      TR[facility] will continue to follow the patient.  HPI: [name] is a 63 y.o. male with past medical history of type 1 diabetes on insulin  pump, status post laryngectomy and tracheostomy, history of stroke, dyslipidemia, essential hypertension, hypothyroidism, major depression, OCD, who presents to the hospital for treating depression.  Insulin  pump was removed due to concern of suicidal ideation.  Currently glucose running high.  Symptomatically, patient has a depressed mood.  Otherwise no complaint.  Review of Systems: As mentioned in the history of present illness. All other systems reviewed and are negative. Past Medical History:  Diagnosis Date   Depression    Diabetes (HCC)    Insulin  Pump   Diabetes mellitus type I (HCC)    Diabetes mellitus without complication (HCC)    GERD (gastroesophageal reflux disease)    H/O laryngectomy    Heel bone fracture    History of embolic stroke    Hyperlipidemia    Hypertension    Radicular pain of right lower extremity    Stroke (HCC)    Suicide attempt (HCC) 2014   damaged larynx - tracheostomy   Thyroid  disease    Past Surgical  History:  Procedure Laterality Date   COLONOSCOPY WITH PROPOFOL  N/A 05/15/2018   Procedure: COLONOSCOPY WITH PROPOFOL ;  Surgeon: [name], Ladell POUR, MD;  Location: [facility];  Service: Gastroenterology;  Laterality: N/A;   ESOPHAGOGASTRODUODENOSCOPY N/A 05/15/2018   Procedure: ESOPHAGOGASTRODUODENOSCOPY (EGD);  Surgeon: [name], Ladell POUR, MD;  Location: [facility];  Service: Gastroenterology;  Laterality: N/A;   FRACTURE SURGERY     Heel bone reconstruction Left    HERNIA REPAIR  02/2011   Umbilical hernia repair    LARYNGECTOMY     LOWER EXTREMITY ANGIOGRAPHY Left 01/31/2021   Procedure: LOWER EXTREMITY ANGIOGRAPHY;  Surgeon: Marea Selinda RAMAN, MD;  Location: [facility];  Service: Cardiovascular;  Laterality: Left;   LOWER EXTREMITY ANGIOGRAPHY Right 02/14/2021   Procedure: LOWER EXTREMITY ANGIOGRAPHY;  Surgeon: Marea Selinda RAMAN, MD;  Location: [facility];  Service: Cardiovascular;  Laterality: Right;   NECK SURGERY     fusion   SPINE SURGERY     TRACHEOSTOMY  2014   from SI attempt   Social History:  reports that he quit smoking about 10 years ago. His smoking use included cigarettes. He has never used smokeless tobacco. He reports current alcohol use of about 2.0 standard drinks of alcohol per week. He reports that he does not use drugs.  Allergies  Allergen Reactions   Pregabalin Other (See Comments), Anaphylaxis and Swelling    Gum Bleeding  Mouth/ gums swelling and bleeding  Mouth swelling and bleeding gums   Buspar [Buspirone]     Makes the patient flip out   Depakote  [Valproic Acid ]  Causes excessive drowsiness   Clopidogrel Rash   Gabapentin Itching and Rash    Family History  Problem Relation Age of Onset   Osteoporosis Mother    Diabetes Mother    Hypertension Father    Mental illness Neg Hx     Prior to Admission medications   Medication Sig Start Date End Date Taking? Authorizing Provider  amLODipine  (NORVASC ) 5 MG tablet Take 1 tablet  (5 mg total) by mouth daily. 01/29/19   Clapacs, Norleen DASEN, MD  apixaban  (ELIQUIS ) 5 MG TABS tablet Take 1 tablet (5 mg total) by mouth 2 (two) times daily. 02/15/21   Stegmayer, Suzen LABOR, PA-C  buPROPion  (WELLBUTRIN  XL) 300 MG 24 hr tablet TAKE 1 TABLET(300 MG) BY MOUTH DAILY WITH BREAKFAST 09/26/23   Eappen, Saramma, MD  chlorthalidone (HYGROTON) 25 MG tablet Take 25 mg by mouth daily. 04/26/23   [provider]  clindamycin-benzoyl peroxide (BENZACLIN) gel Apply 1 Application topically 2 (two) times daily. 12/31/23   [provider]  clonazePAM  (KLONOPIN ) 1 MG tablet Take 1 tablet (1 mg total) by mouth daily as needed for anxiety. Please limit use 03/21/24 05/20/24  Eappen, Saramma, MD  Continuous Glucose Sensor (DEXCOM G7 SENSOR) MISC Use 1 each every 10 (ten) days 06/15/22   [provider]  escitalopram  (LEXAPRO ) 10 MG tablet Take 10 mg by mouth daily.    [provider]  HUMALOG 100 UNIT/ML injection Inject 20-120 Units into the skin daily. Uses with Insulin  Pump 03/27/22   [provider]  LANTUS  SOLOSTAR 100 UNIT/ML Solostar Pen Inject 20 Units into the skin at bedtime. Patient not taking: Reported on 03/25/2024 03/19/24 03/19/25  [provider]  levothyroxine  (SYNTHROID ) 75 MCG tablet TAKE 1 TABLET BY MOUTH DAILY AT 6 AM 04/25/19   Clapacs, Norleen DASEN, MD  metoprolol  succinate (TOPROL -XL) 25 MG 24 hr tablet TAKE 1 TABLET(25 MG) BY MOUTH EVERY DAY 10/27/21   [provider]  mometasone  (ELOCON ) 0.1 % cream Apply to rash on arms and chest twice daily until improved. 12/26/21   Jackquline Sawyer, MD  OLANZapine  (ZYPREXA ) 10 MG tablet Take 1 tablet (10 mg total) by mouth at bedtime. Has supplies 03/25/24   Eappen, Saramma, MD  ondansetron  (ZOFRAN -ODT) 4 MG disintegrating tablet Take 4 mg by mouth every 8 (eight) hours as needed for nausea or vomiting. Patient not taking: Reported on 03/25/2024    [provider]  pantoprazole  (PROTONIX )  40 MG tablet TAKE 1 TABLET(40 MG) BY MOUTH TWICE DAILY 04/29/19   [provider]  rosuvastatin  (CRESTOR ) 20 MG tablet Take 20 mg by mouth at bedtime. 01/03/22   [provider]  sodium hypochlorite (DAKIN'S 1/4 STRENGTH) 0.125 % SOLN  06/02/21   [provider]  telmisartan (MICARDIS) 80 MG tablet Take 80 mg by mouth daily.    [provider]  zaleplon  (SONATA ) 10 MG capsule Take 1 capsule (10 mg total) by mouth at bedtime as needed for sleep. Stop Zolpidem  ( Ambien ) 02/08/24   Eappen, Saramma, MD    Physical Exam: Vitals:   03/26/24 2321 03/27/24 0724  BP: 136/66 (!) 138/56  Pulse: (!) 58 (!) 54  Resp: 18 18  Temp: (!) 97.5 F (36.4 C) 97.6 F (36.4 C)  TempSrc: Temporal   SpO2:  95%  Weight: 112.9 kg   Height: 6' (1.829 m)    Physical Exam Constitutional:      General: He is not in acute distress.    Appearance: He is  obese. He is not ill-appearing or toxic-appearing.  HENT:     Head: Normocephalic and atraumatic.     Nose: Nose normal.     Mouth/Throat:     Mouth: Mucous membranes are moist.     Pharynx: Oropharynx is clear.  Eyes:     Conjunctiva/sclera: Conjunctivae normal.     Pupils: Pupils are equal, round, and reactive to light.  Neck:     Comments: Status post tracheostomy. Cardiovascular:     Rate and Rhythm: Normal rate and regular rhythm.     Heart sounds: No murmur heard.    No gallop.  Pulmonary:     Effort: Pulmonary effort is normal. No respiratory distress.     Breath sounds: Normal breath sounds. No wheezing.  Abdominal:     General: Abdomen is flat. Bowel sounds are normal. There is no distension.     Palpations: Abdomen is soft.     Tenderness: There is no abdominal tenderness.  Musculoskeletal:        General: No swelling. Normal range of motion.     Cervical back: Normal range of motion and neck supple. No rigidity.     Comments: Status post right BKA.  Skin:    General: Skin is warm and dry.     Coloration:  Skin is not jaundiced.  Neurological:     General: No focal deficit present.     Mental Status: He is alert and oriented to person, place, and time.     Cranial Nerves: No cranial nerve deficit.  Psychiatric:     Comments: Depressed mood.     Data Reviewed:   Lab results reviewed.   Family Communication: None Primary team communication: D/W with Dr. Jadappale Thank you very much for involving us  in the care of your patient.  Author: Murvin Mana, MD 03/27/2024 11:26 AM  For on call review [email or web address].

## 2024-03-27 NOTE — Group Note (Signed)
 LCSW Group Therapy Note  Group Date: 03/27/2024 Start Time: 1315 End Time: 1345   Type of Therapy and Topic:  Group Therapy - Healthy vs Unhealthy Coping Skills  Participation Level:  Did Not Attend   Description of Group The focus of this group was to determine what unhealthy coping techniques typically are used by group members and what healthy coping techniques would be helpful in coping with various problems. Patients were guided in becoming aware of the differences between healthy and unhealthy coping techniques. Patients were asked to identify 2-3 healthy coping skills they would like to learn to use more effectively.  Therapeutic Goals Patients learned that coping is what human beings do all day long to deal with various situations in their lives Patients defined and discussed healthy vs unhealthy coping techniques Patients identified their preferred coping techniques and identified whether these were healthy or unhealthy Patients determined 2-3 healthy coping skills they would like to become more familiar with and use more often. Patients provided support and ideas to each other   Summary of Patient Progress:  X  Therapeutic Modalities Cognitive Behavioral Therapy Motivational Interviewing  [name], [place] 03/27/2024  1:37 PM

## 2024-03-28 DIAGNOSIS — Z930 Tracheostomy status: Secondary | ICD-10-CM

## 2024-03-28 DIAGNOSIS — E1069 Type 1 diabetes mellitus with other specified complication: Secondary | ICD-10-CM | POA: Diagnosis not present

## 2024-03-28 DIAGNOSIS — Z86718 Personal history of other venous thrombosis and embolism: Secondary | ICD-10-CM

## 2024-03-28 DIAGNOSIS — E669 Obesity, unspecified: Secondary | ICD-10-CM | POA: Insufficient documentation

## 2024-03-28 LAB — GLUCOSE, CAPILLARY
Glucose-Capillary: 418 mg/dL — ABNORMAL HIGH (ref 70–99)
Glucose-Capillary: 305 mg/dL — ABNORMAL HIGH (ref 70–99)
Glucose-Capillary: 97 mg/dL (ref 70–99)
Glucose-Capillary: 99 mg/dL (ref 70–99)

## 2024-03-28 MED ORDER — INSULIN ASPART 100 UNIT/ML IJ SOLN
12.0000 [IU] | Freq: Three times a day (TID) | INTRAMUSCULAR | Status: DC
  Administered 2024-03-28 (×2): 12 [IU] via SUBCUTANEOUS

## 2024-03-28 MED ORDER — INSULIN GLARGINE-YFGN 100 UNIT/ML ~~LOC~~ SOLN
20.0000 [IU] | Freq: Two times a day (BID) | SUBCUTANEOUS | Status: DC
  Administered 2024-03-28 – 2024-03-31 (×7): 20 [IU] via SUBCUTANEOUS
  Filled 2024-03-28 (×8): qty 0.2

## 2024-03-28 MED ORDER — INSULIN ASPART 100 UNIT/ML IJ SOLN
10.0000 [IU] | Freq: Once | INTRAMUSCULAR | Status: AC
  Administered 2024-03-28: 10 [IU] via SUBCUTANEOUS

## 2024-03-28 NOTE — Assessment & Plan Note (Signed)
 Patient on Lexapro , Zyprexa , Wellbutrin , Seroquel  and clonazepam  as per psychiatry.

## 2024-03-28 NOTE — Progress Notes (Signed)
 [facility] MD Progress Note  03/28/2024 3:28 PM [name]  MRN:  [id number] [name] is a 63 y.o. male admitted: Presented to the ED for 03/25/2024  5:37 PM for for worsening depression and active suicidal ideation in the setting of functional decline and multiple failed attempts to be evaluated during recent ED visits. He carries the psychiatric diagnoses of Major Depressive Disorder, recurrent severe, and has a past medical history of diabetes mellitus, bilateral below-knee amputations, tracheostomy, and difficulty adjusting to chronic medical disability. Patient is admitted to [facility] unit with Q15 min safety monitoring. Multidisciplinary team approach is offered. Medication management; group/milieu therapy is offered.  Subjective:  Chart reviewed, case discussed in multidisciplinary meeting, patient seen during rounds.   Patient met with the treatment team.  Patient talks about his multiple medical problems being a major trigger for his depression and anxiety.  He talks about since he got his above-knee amputation last year his life has become physically limited.  He identifies his mom being the main support.  He denies SI/HI/plan today.  He denies auditory/visual hallucinations.  Per nursing report patient is more sleepy in the mornings but is able to come out and engage in groups in the evening.  Provided discussed about maximizing the dosage of his current medications both Wellbutrin  and Lexapro .  He reports that these are the only 2 medications that has been tried also for.  Provider discussed considering a mood stabilizer if he does not see any improvement after optimization of dosage.  Past Psychiatric History: see h&P Family History:  Family History  Problem Relation Age of Onset   Osteoporosis Mother    Diabetes Mother    Hypertension Father    Mental illness Neg Hx    Social History:  Social History   Substance and Sexual Activity  Alcohol Use Yes   Alcohol/week: 2.0  standard drinks of alcohol   Types: 2 Shots of liquor per week     Social History   Substance and Sexual Activity  Drug Use No   Comment: Pt denied; UDS not available    Social History   Socioeconomic History   Marital status: Widowed    Spouse name: [name]   Number of children: Not on file   Years of education: Not on file   Highest education level: Not on file  Occupational History   Not on file  Tobacco Use   Smoking status: Former    Current packs/day: 0.00    Types: Cigarettes    Quit date: 04/09/2013    Years since quitting: 10.9   Smokeless tobacco: Never  Vaping Use   Vaping status: Never Used  Substance and Sexual Activity   Alcohol use: Yes    Alcohol/week: 2.0 standard drinks of alcohol    Types: 2 Shots of liquor per week   Drug use: No    Comment: Pt denied; UDS not available   Sexual activity: Yes    Partners: Female    Birth control/protection: Condom  Other Topics Concern   Not on file  Social History Narrative   Not on file   Social Drivers of Health   Financial Resource Strain: Low Risk  (12/19/2023)   Received from [facility]   Overall Financial Resource Strain (CARDIA)    Difficulty of Paying Living Expenses: Not hard at all  Food Insecurity: No Food Insecurity (03/26/2024)   Hunger Vital Sign    Worried About Running Out of Food in the Last  Year: Never true    Ran Out of Food in the Last Year: Never true  Transportation Needs: No Transportation Needs (03/26/2024)   PRAPARE - Administrator, Civil Service (Medical): No    Lack of Transportation (Non-Medical): No  Physical Activity: Sufficiently Active (07/12/2023)   Exercise Vital Sign    Days of Exercise per Week: 7 days    Minutes of Exercise per Session: 30 min  Stress: Stress Concern Present (07/12/2023)   [employer] of Occupational Health - Occupational Stress Questionnaire    Feeling of Stress : Very much  Social Connections: Socially Isolated  (07/12/2023)   Social Connection and Isolation Panel    Frequency of Communication with Friends and Family: More than three times a week    Frequency of Social Gatherings with Friends and Family: Never    Attends Religious Services: Never    Database Administrator or Organizations: No    Attends Banker Meetings: Never    Marital Status: Widowed   Past Medical History:  Past Medical History:  Diagnosis Date   Depression    Diabetes (HCC)    Insulin  Pump   Diabetes mellitus type I (HCC)    Diabetes mellitus without complication (HCC)    GERD (gastroesophageal reflux disease)    H/O laryngectomy    Heel bone fracture    History of embolic stroke    Hyperlipidemia    Hypertension    Radicular pain of right lower extremity    Stroke (HCC)    Suicide attempt (HCC) 2014   damaged larynx - tracheostomy   Thyroid  disease     Past Surgical History:  Procedure Laterality Date   COLONOSCOPY WITH PROPOFOL  N/A 05/15/2018   Procedure: COLONOSCOPY WITH PROPOFOL ;  Surgeon: [name], Ladell POUR, MD;  Location: [facility] ENDOSCOPY;  Service: Gastroenterology;  Laterality: N/A;   ESOPHAGOGASTRODUODENOSCOPY N/A 05/15/2018   Procedure: ESOPHAGOGASTRODUODENOSCOPY (EGD);  Surgeon: [name], Ladell POUR, MD;  Location: [facility] ENDOSCOPY;  Service: Gastroenterology;  Laterality: N/A;   FRACTURE SURGERY     Heel bone reconstruction Left    HERNIA REPAIR  02/2011   Umbilical hernia repair    LARYNGECTOMY     LOWER EXTREMITY ANGIOGRAPHY Left 01/31/2021   Procedure: LOWER EXTREMITY ANGIOGRAPHY;  Surgeon: Marea Selinda RAMAN, MD;  Location: [facility] INVASIVE CV LAB;  Service: Cardiovascular;  Laterality: Left;   LOWER EXTREMITY ANGIOGRAPHY Right 02/14/2021   Procedure: LOWER EXTREMITY ANGIOGRAPHY;  Surgeon: Marea Selinda RAMAN, MD;  Location: [facility] INVASIVE CV LAB;  Service: Cardiovascular;  Laterality: Right;   NECK SURGERY     fusion   SPINE SURGERY     TRACHEOSTOMY  2014   from SI attempt    Current  Medications: Current Facility-Administered Medications  Medication Dose Route Frequency Provider Last Rate Last Admin   acetaminophen  (TYLENOL ) tablet 650 mg  650 mg Oral Q6H PRN Smith, Annie B, NP   650 mg at 03/26/24 2101   alum & mag hydroxide-simeth (MAALOX/MYLANTA) 200-200-20 MG/5ML suspension 30 mL  30 mL Oral Q4H PRN Smith, Annie B, NP       amLODipine  (NORVASC ) tablet 5 mg  5 mg Oral Daily Smith, Annie B, NP   5 mg at 03/28/24 1027   apixaban  (ELIQUIS ) tablet 5 mg  5 mg Oral BID Smith, Annie B, NP   5 mg at 03/28/24 1027   buPROPion  (WELLBUTRIN  XL) 24 hr tablet 300 mg  300 mg Oral Daily Smith, Annie B, NP   300 mg  at 03/28/24 1027   chlorthalidone  (HYGROTON ) tablet 25 mg  25 mg Oral Daily Smith, Annie B, NP   25 mg at 03/28/24 1028   clonazePAM  (KLONOPIN ) tablet 1 mg  1 mg Oral Daily PRN Smith, Annie B, NP   1 mg at 03/27/24 2147   escitalopram  (LEXAPRO ) tablet 10 mg  10 mg Oral Daily Smith, Annie B, NP   10 mg at 03/28/24 1027   insulin  aspart (novoLOG ) injection 0-15 Units  0-15 Units Subcutaneous TID WC Smith, Annie B, NP   11 Units at 03/28/24 1254   insulin  aspart (novoLOG ) injection 0-5 Units  0-5 Units Subcutaneous QHS Smith, Annie B, NP   3 Units at 03/27/24 2012   insulin  aspart (novoLOG ) injection 12 Units  12 Units Subcutaneous TID WC Jase Himmelberger, MD   12 Units at 03/28/24 1255   insulin  glargine-yfgn (SEMGLEE ) injection 20 Units  20 Units Subcutaneous BID Inri Sobieski, MD   20 Units at 03/28/24 1029   irbesartan  (AVAPRO ) tablet 300 mg  300 mg Oral Daily Smith, Annie B, NP   300 mg at 03/28/24 1028   levothyroxine  (SYNTHROID ) tablet 75 mcg  75 mcg Oral Q0600 Smith, Annie B, NP   75 mcg at 03/28/24 9380   magnesium  hydroxide (MILK OF MAGNESIA) suspension 30 mL  30 mL Oral Daily PRN Smith, Annie B, NP       metoprolol  succinate (TOPROL -XL) 24 hr tablet 25 mg  25 mg Oral Daily Smith, Annie B, NP   25 mg at 03/28/24 1027   OLANZapine  (ZYPREXA ) injection 5 mg  5 mg  Intramuscular TID PRN Smith, Annie B, NP       OLANZapine  (ZYPREXA ) tablet 10 mg  10 mg Oral QHS Smith, Annie B, NP   10 mg at 03/27/24 2147   OLANZapine  zydis (ZYPREXA ) disintegrating tablet 5 mg  5 mg Oral TID PRN Smith, Annie B, NP       ondansetron  (ZOFRAN -ODT) disintegrating tablet 4 mg  4 mg Oral Q8H PRN Smith, Annie B, NP       pantoprazole  (PROTONIX ) EC tablet 40 mg  40 mg Oral Daily Smith, Annie B, NP   40 mg at 03/28/24 0753   rosuvastatin  (CRESTOR ) tablet 20 mg  20 mg Oral QHS Smith, Annie B, NP   20 mg at 03/27/24 2147   zolpidem  (AMBIEN ) tablet 5 mg  5 mg Oral QHS PRN Smith, Annie B, NP   5 mg at 03/27/24 2147    Lab Results:  Results for orders placed or performed during the hospital encounter of 03/26/24 (from the past 48 hours)  Glucose, capillary     Status: Abnormal   Collection Time: 03/26/24  8:37 PM  Result Value Ref Range   Glucose-Capillary 203 (H) 70 - 99 mg/dL    Comment: Glucose reference range applies only to samples taken after fasting for at least 8 hours.  Glucose, capillary     Status: Abnormal   Collection Time: 03/27/24  1:38 AM  Result Value Ref Range   Glucose-Capillary 354 (H) 70 - 99 mg/dL    Comment: Glucose reference range applies only to samples taken after fasting for at least 8 hours.  Glucose, capillary     Status: Abnormal   Collection Time: 03/27/24  5:33 AM  Result Value Ref Range   Glucose-Capillary 311 (H) 70 - 99 mg/dL    Comment: Glucose reference range applies only to samples taken after fasting for at least 8 hours.  Glucose, capillary     Status: Abnormal   Collection Time: 03/27/24  7:52 AM  Result Value Ref Range   Glucose-Capillary 355 (H) 70 - 99 mg/dL    Comment: Glucose reference range applies only to samples taken after fasting for at least 8 hours.  Glucose, capillary     Status: Abnormal   Collection Time: 03/27/24 10:29 AM  Result Value Ref Range   Glucose-Capillary 406 (H) 70 - 99 mg/dL    Comment: Glucose reference  range applies only to samples taken after fasting for at least 8 hours.  Glucose, random     Status: Abnormal   Collection Time: 03/27/24 10:55 AM  Result Value Ref Range   Glucose, Bld 381 (H) 70 - 99 mg/dL    Comment: Glucose reference range applies only to samples taken after fasting for at least 8 hours. Performed at [facility], [address] [place], [place] [address]   Glucose, capillary     Status: Abnormal   Collection Time: 03/27/24 11:47 AM  Result Value Ref Range   Glucose-Capillary 314 (H) 70 - 99 mg/dL    Comment: Glucose reference range applies only to samples taken after fasting for at least 8 hours.  Glucose, capillary     Status: Abnormal   Collection Time: 03/27/24  4:47 PM  Result Value Ref Range   Glucose-Capillary 65 (L) 70 - 99 mg/dL    Comment: Glucose reference range applies only to samples taken after fasting for at least 8 hours.  Glucose, capillary     Status: Abnormal   Collection Time: 03/27/24  6:41 PM  Result Value Ref Range   Glucose-Capillary 233 (H) 70 - 99 mg/dL    Comment: Glucose reference range applies only to samples taken after fasting for at least 8 hours.  Glucose, capillary     Status: Abnormal   Collection Time: 03/27/24  8:05 PM  Result Value Ref Range   Glucose-Capillary 293 (H) 70 - 99 mg/dL    Comment: Glucose reference range applies only to samples taken after fasting for at least 8 hours.  Glucose, capillary     Status: Abnormal   Collection Time: 03/27/24  9:54 PM  Result Value Ref Range   Glucose-Capillary 413 (H) 70 - 99 mg/dL    Comment: Glucose reference range applies only to samples taken after fasting for at least 8 hours.  Glucose, capillary     Status: Abnormal   Collection Time: 03/28/24  7:24 AM  Result Value Ref Range   Glucose-Capillary 418 (H) 70 - 99 mg/dL    Comment: Glucose reference range applies only to samples taken after fasting for at least 8 hours.  Glucose, capillary     Status: Abnormal    Collection Time: 03/28/24 11:44 AM  Result Value Ref Range   Glucose-Capillary 305 (H) 70 - 99 mg/dL    Comment: Glucose reference range applies only to samples taken after fasting for at least 8 hours.    Blood Alcohol level:  Lab Results  Component Value Date   [facility] <15 03/25/2024   ETH <10 01/21/2019    Metabolic Disorder Labs: Lab Results  Component Value Date   HGBA1C 7.8 (H) 03/25/2024   MPG 177.16 03/25/2024   MPG 165.68 10/28/2022   No results found for: PROLACTIN Lab Results  Component Value Date   CHOL 161 09/15/2016   TRIG 190 (H) 09/15/2016   HDL 41 09/15/2016   CHOLHDL 3.9 09/15/2016   VLDL 38  09/15/2016   LDLCALC 82 09/15/2016    Physical Findings: AIMS:  , ,  ,  ,    CIWA:    COWS:      Psychiatric Specialty Exam:  Presentation  General Appearance:  Casual  Eye Contact: Fleeting  Speech: Slow  Speech Volume: Decreased    Mood and Affect  Mood: Anxious; Depressed  Affect: Depressed; Flat   Thought Process  Thought Processes: Coherent  Orientation:Full (Time, Place and Person)  Thought Content:Logical  Hallucinations:Hallucinations: None  Ideas of Reference:None  Suicidal Thoughts:Suicidal Thoughts: Yes, Passive  Homicidal Thoughts:Homicidal Thoughts: No   Sensorium  Memory: Immediate Fair; Remote Fair  Judgment: Impaired  Insight: Shallow   Executive Functions  Concentration: Poor  Attention Span: Poor  Recall: [employer] of Knowledge: Fair  Language: Fair   Psychomotor Activity  Psychomotor Activity: Psychomotor Activity: Normal  Musculoskeletal: Strength & Muscle Tone: decreased Gait & Station: unsteady Assets  Assets: Manufacturing Systems Engineer; Desire for Improvement    Physical Exam: Physical Exam ROS Blood pressure (!) 122/33, pulse 72, temperature (!) 97.1 F (36.2 C), resp. rate 18, height 6' (1.829 m), weight 112.9 kg, SpO2 94%. Body mass index is 33.77 kg/m.  Diagnosis:  Principal Problem:   Uncontrolled type 1 diabetes mellitus with hyperglycemia, with long-term current use of insulin  (HCC) Active Problems:   Acquired hypothyroidism   HTN (hypertension)   Tracheostomy in place (HCC)   Hyperlipidemia, unspecified   MDD (major depressive disorder), recurrent severe, without psychosis (HCC)   Obesity (BMI 30-39.9)   History of DVT (deep vein thrombosis)   Clinical Decision Making: Patient currently admitted with worsening depression and passive suicidal ideation in the context of multiple medical problems.  Patient will be monitored closely for safety  Treatment Plan Summary:  Safety and Monitoring:             -- Voluntary admission to inpatient psychiatric unit for safety, stabilization and treatment             -- Daily contact with patient to assess and evaluate symptoms and progress in treatment             -- Patient's case to be discussed in multi-disciplinary team meeting             -- Observation Level: q15 minute checks             -- Vital signs:  q12 hours             -- Precautions: suicide, elopement, and assault   2. Psychiatric Diagnoses and Treatment:              Wellbutrin  XL 300 mg daily-increase to 450 mg Klonopin  1mg  daily PRN Lexapro  10 mg daily increase it to 15 mg   zaleplon  10 mg qhs   -- The risks/benefits/side-effects/alternatives to this medication were discussed in detail with the patient and time was given for questions. The patient consents to medication trial.                -- Metabolic profile and EKG monitoring obtained while on an atypical antipsychotic (BMI: Lipid Panel: HbgA1c: QTc:)              -- Encouraged patient to participate in unit milieu and in scheduled group therapies  3. Medical Issues Being Addressed:   Hospitalist and diabetic educator consult placed: * Uncontrolled type 1 diabetes mellitus with hyperglycemia, with long-term current use of insulin  (HCC) High sugars last night  and again this  morning.  1 dose of NovoLog  10 given.  Increased Semglee  insulin  to 20 units twice daily.  Increase meal coverage to 12 units 3 times daily plus sliding scale.   HTN (hypertension) Patient on Norvasc , chlorthalidone , irbesartan  and Toprol    Acquired hypothyroidism On levothyroxine    History of DVT (deep vein thrombosis) On Eliquis  4. Discharge Planning:   -- Social work and case management to assist with discharge planning and identification of hospital follow-up needs prior to discharge  -- Estimated LOS: 3-4 days  Allyn Foil, MD 03/28/2024, 3:28 PM

## 2024-03-28 NOTE — Assessment & Plan Note (Signed)
 On Eliquis 

## 2024-03-28 NOTE — Group Note (Signed)
 Date:  03/28/2024 Time:  4:18 PM  Group Topic/Focus:  Overcoming Stress:   The focus of this group is to define stress and help patients assess their triggers.    Participation Level:  Did Not Attend   [name] 03/28/2024, 4:18 PM

## 2024-03-28 NOTE — Plan of Care (Signed)
   Problem: Coping: Goal: Coping ability will improve Outcome: Progressing Goal: Will verbalize feelings Outcome: Progressing

## 2024-03-28 NOTE — Assessment & Plan Note (Signed)
 Stable

## 2024-03-28 NOTE — Assessment & Plan Note (Signed)
 Sugars on the lower side last night.  This morning sugars up again.  Continue increased dose of Semglee  insulin  to 20 units twice daily.  This morning I thought I had to go down on the insulin  dose prior to meals but will have to go back up to 12 units 3 times daily plus sliding scale.

## 2024-03-28 NOTE — Hospital Course (Signed)
 63 y.o. male with past medical history of type 1 diabetes on insulin  pump, status post laryngectomy and tracheostomy, history of stroke, dyslipidemia, essential hypertension, hypothyroidism, major depression, OCD, who presents to the hospital for treating depression.  Insulin  pump was removed due to concern of suicidal ideation.  Currently glucose running high.   Symptomatically, patient has a depressed mood.  Otherwise no complaint.  11/21.  Sugars very elevated last night and this morning.  Uses insulin  pump at home.  This morning needed to give one-time dose of NovoLog  10 and increase Semglee  insulin  to 20 units twice daily, increased meal coverage to 12 units 3 times daily. 11/22.  Patient states he is having a lot more trouble breathing with his trach.  He is suctioning himself.  He states he is not getting enough food. 11/23.  Patient is getting for some medication for decades.  Complains of some daytime anxiety and concerned about him living alone and cooking at home.  Asked nursing staff to cut one of the paper down pant leg so he can get his prosthesis on. 11/24.  Patient complains of daytime anxiety.  Complained of jock itch and wanted a cream. 11/25.  Patient complains of fatigue even though he slept a full 8 hours.

## 2024-03-28 NOTE — ED Notes (Signed)
 Receiving facility called (03/28/24 @ 447pm) and was asking about his trach supplies. Per note on 03/25/24 @ 2038 by DJ his supplies were listed in bag and he was given his supplies when he was DC to facility with said bag. He told the facility he had two bags. I advised he needed to reach out to his mother who had his other bag of supplies. Ours was accounted for per charting.

## 2024-03-28 NOTE — Progress Notes (Signed)
   03/27/24 2030  Psych Admission Type (Psych Patients Only)  Admission Status Voluntary  Psychosocial Assessment  Patient Complaints Depression  Eye Contact Brief  Facial Expression Pensive  Affect Appropriate to circumstance  Speech Logical/coherent  Interaction Assertive  Motor Activity Other (Comment) (wheelchair)  Appearance/Hygiene In scrubs  Behavior Characteristics Appropriate to situation  Mood Depressed  Thought Process  Coherency WDL  Content WDL  Delusions None reported or observed  Perception WDL  Hallucination None reported or observed  Judgment Impaired  Confusion None  Danger to Self  Current suicidal ideation? Denies  Agreement Not to Harm Self Yes  Description of Agreement verbal  Danger to Others  Danger to Others None reported or observed   Mood/Behavior:  Depressed.    Psych assessment: Denies SI/HI and AVH.     Interaction / Group attendance:  Present in the milieu. Interacting with peers and staff.  Attended group.    Medication/ PRNs: Compliant with scheduled medications. Required PRNs Ambien  for sleep and noted effective and Klonopin  for anxiety noted effective.   Pain: Denies  BG 293 at 2005 prior to snack. Pt required sliding scale insulin  3 units. Rechecked BG at 2154 BG 413. Pt given long acting insulin . Provider made aware no new orders at this time. POC.    Pt did not require trach suction over night.   15 min checks in place for safety.

## 2024-03-28 NOTE — Assessment & Plan Note (Signed)
-  On levothyroxine

## 2024-03-28 NOTE — BH IP Treatment Plan (Signed)
 Interdisciplinary Treatment and Diagnostic Plan Update  03/28/2024 Time of Session: 11:13 AM  [name] MRN: [id number]  Principal Diagnosis: MDD (major depressive disorder)  Secondary Diagnoses: Active Problems:   MDD (major depressive disorder), recurrent severe, without psychosis (HCC)   Current Medications:  Current Facility-Administered Medications  Medication Dose Route Frequency Provider Last Rate Last Admin   acetaminophen  (TYLENOL ) tablet 650 mg  650 mg Oral Q6H PRN Smith, Annie B, NP   650 mg at 03/26/24 2101   alum & mag hydroxide-simeth (MAALOX/MYLANTA) 200-200-20 MG/5ML suspension 30 mL  30 mL Oral Q4H PRN Smith, Annie B, NP       amLODipine  (NORVASC ) tablet 5 mg  5 mg Oral Daily Smith, Annie B, NP   5 mg at 03/28/24 1027   apixaban  (ELIQUIS ) tablet 5 mg  5 mg Oral BID Smith, Annie B, NP   5 mg at 03/28/24 1027   buPROPion  (WELLBUTRIN  XL) 24 hr tablet 300 mg  300 mg Oral Daily Smith, Annie B, NP   300 mg at 03/28/24 1027   chlorthalidone  (HYGROTON ) tablet 25 mg  25 mg Oral Daily Smith, Annie B, NP   25 mg at 03/28/24 1028   clonazePAM  (KLONOPIN ) tablet 1 mg  1 mg Oral Daily PRN Smith, Annie B, NP   1 mg at 03/27/24 2147   escitalopram  (LEXAPRO ) tablet 10 mg  10 mg Oral Daily Smith, Annie B, NP   10 mg at 03/28/24 1027   insulin  aspart (novoLOG ) injection 0-15 Units  0-15 Units Subcutaneous TID WC Smith, Annie B, NP   11 Units at 03/27/24 1205   insulin  aspart (novoLOG ) injection 0-5 Units  0-5 Units Subcutaneous QHS Smith, Annie B, NP   3 Units at 03/27/24 2012   insulin  aspart (novoLOG ) injection 12 Units  12 Units Subcutaneous TID WC Jadapalle, Sree, MD       insulin  glargine-yfgn (SEMGLEE ) injection 20 Units  20 Units Subcutaneous BID Jadapalle, Sree, MD   20 Units at 03/28/24 1029   irbesartan  (AVAPRO ) tablet 300 mg  300 mg Oral Daily Smith, Annie B, NP   300 mg at 03/28/24 1028   levothyroxine  (SYNTHROID ) tablet 75 mcg  75 mcg Oral Q0600 Smith, Annie B, NP   75 mcg  at 03/28/24 0619   magnesium  hydroxide (MILK OF MAGNESIA) suspension 30 mL  30 mL Oral Daily PRN Smith, Annie B, NP       metoprolol  succinate (TOPROL -XL) 24 hr tablet 25 mg  25 mg Oral Daily Smith, Annie B, NP   25 mg at 03/28/24 1027   OLANZapine  (ZYPREXA ) injection 5 mg  5 mg Intramuscular TID PRN Smith, Annie B, NP       OLANZapine  (ZYPREXA ) tablet 10 mg  10 mg Oral QHS Smith, Annie B, NP   10 mg at 03/27/24 2147   OLANZapine  zydis (ZYPREXA ) disintegrating tablet 5 mg  5 mg Oral TID PRN Smith, Annie B, NP       ondansetron  (ZOFRAN -ODT) disintegrating tablet 4 mg  4 mg Oral Q8H PRN Smith, Annie B, NP       pantoprazole  (PROTONIX ) EC tablet 40 mg  40 mg Oral Daily Smith, Annie B, NP   40 mg at 03/28/24 0753   rosuvastatin  (CRESTOR ) tablet 20 mg  20 mg Oral QHS Smith, Annie B, NP   20 mg at 03/27/24 2147   zolpidem  (AMBIEN ) tablet 5 mg  5 mg Oral QHS PRN Smith, Annie B, NP   5 mg at  03/27/24 05/05/2146   PTA Medications: Medications Prior to Admission  Medication Sig Dispense Refill Last Dose/Taking   amLODipine  (NORVASC ) 5 MG tablet Take 1 tablet (5 mg total) by mouth daily. 30 tablet 1    apixaban  (ELIQUIS ) 5 MG TABS tablet Take 1 tablet (5 mg total) by mouth 2 (two) times daily. 60 tablet 5    buPROPion  (WELLBUTRIN  XL) 300 MG 24 hr tablet TAKE 1 TABLET(300 MG) BY MOUTH DAILY WITH BREAKFAST 90 tablet 1    chlorthalidone  (HYGROTON ) 25 MG tablet Take 25 mg by mouth daily.      clindamycin-benzoyl peroxide (BENZACLIN) gel Apply 1 Application topically 2 (two) times daily.      clonazePAM  (KLONOPIN ) 1 MG tablet Take 1 tablet (1 mg total) by mouth daily as needed for anxiety. Please limit use 30 tablet 1    Continuous Glucose Sensor (DEXCOM G7 SENSOR) MISC Use 1 each every 10 (ten) days      escitalopram  (LEXAPRO ) 10 MG tablet Take 10 mg by mouth daily.      HUMALOG 100 UNIT/ML injection Inject 20-120 Units into the skin daily. Uses with Insulin  Pump      LANTUS  SOLOSTAR 100 UNIT/ML Solostar Pen  Inject 20 Units into the skin at bedtime. (Patient not taking: Reported on 03/25/2024)      levothyroxine  (SYNTHROID ) 75 MCG tablet TAKE 1 TABLET BY MOUTH DAILY AT 6 AM 90 tablet 0    metoprolol  succinate (TOPROL -XL) 25 MG 24 hr tablet TAKE 1 TABLET(25 MG) BY MOUTH EVERY DAY      mometasone  (ELOCON ) 0.1 % cream Apply to rash on arms and chest twice daily until improved. 45 g 1    OLANZapine  (ZYPREXA ) 10 MG tablet Take 1 tablet (10 mg total) by mouth at bedtime. Has supplies      ondansetron  (ZOFRAN -ODT) 4 MG disintegrating tablet Take 4 mg by mouth every 8 (eight) hours as needed for nausea or vomiting. (Patient not taking: Reported on 03/25/2024)      pantoprazole  (PROTONIX ) 40 MG tablet TAKE 1 TABLET(40 MG) BY MOUTH TWICE DAILY      rosuvastatin  (CRESTOR ) 20 MG tablet Take 20 mg by mouth at bedtime.      sodium hypochlorite (DAKIN'S 1/4 STRENGTH) 0.125 % SOLN  (Patient not taking: Reported on 03/25/2024)      telmisartan (MICARDIS) 80 MG tablet Take 80 mg by mouth daily.      zaleplon  (SONATA ) 10 MG capsule Take 1 capsule (10 mg total) by mouth at bedtime as needed for sleep. Stop Zolpidem  ( Ambien ) 30 capsule 3     Patient Stressors: Health problems   Other: death of wife in 05-05-21, and family strain between patient and his four children.     Patient Strengths: Motivation for treatment/growth  Supportive family/friends   Treatment Modalities: Medication Management, Group therapy, Case management,  1 to 1 session with clinician, Psychoeducation, Recreational therapy.   Physician Treatment Plan for Primary Diagnosis: MDD (major depressive disorder) Long Term Goal(s): Improvement in symptoms so as ready for discharge   Short Term Goals: Ability to identify changes in lifestyle to reduce recurrence of condition will improve Ability to verbalize feelings will improve Ability to disclose and discuss suicidal ideas Ability to demonstrate self-control will improve Ability to identify and  develop effective coping behaviors will improve  Medication Management: Evaluate patient's response, side effects, and tolerance of medication regimen.  Therapeutic Interventions: 1 to 1 sessions, Unit Group sessions and Medication administration.  Evaluation of Outcomes: Progressing  Physician Treatment Plan for Secondary Diagnosis: Active Problems:   MDD (major depressive disorder), recurrent severe, without psychosis (HCC)  Long Term Goal(s): Improvement in symptoms so as ready for discharge   Short Term Goals: Ability to identify changes in lifestyle to reduce recurrence of condition will improve Ability to verbalize feelings will improve Ability to disclose and discuss suicidal ideas Ability to demonstrate self-control will improve Ability to identify and develop effective coping behaviors will improve     Medication Management: Evaluate patient's response, side effects, and tolerance of medication regimen.  Therapeutic Interventions: 1 to 1 sessions, Unit Group sessions and Medication administration.  Evaluation of Outcomes: Progressing   RN Treatment Plan for Primary Diagnosis: MDD (major depressive disorder) Long Term Goal(s): Knowledge of disease and therapeutic regimen to maintain health will improve  Short Term Goals: Ability to remain free from injury will improve, Ability to verbalize frustration and anger appropriately will improve, Ability to demonstrate self-control, Ability to participate in decision making will improve, Ability to verbalize feelings will improve, Ability to disclose and discuss suicidal ideas, Ability to identify and develop effective coping behaviors will improve, and Compliance with prescribed medications will improve  Medication Management: RN will administer medications as ordered by provider, will assess and evaluate patient's response and provide education to patient for prescribed medication. RN will report any adverse and/or side effects to  prescribing provider.  Therapeutic Interventions: 1 on 1 counseling sessions, Psychoeducation, Medication administration, Evaluate responses to treatment, Monitor vital signs and CBGs as ordered, Perform/monitor CIWA, COWS, AIMS and Fall Risk screenings as ordered, Perform wound care treatments as ordered.  Evaluation of Outcomes: Progressing   LCSW Treatment Plan for Primary Diagnosis: MDD (major depressive disorder) Long Term Goal(s): Safe transition to appropriate next level of care at discharge, Engage patient in therapeutic group addressing interpersonal concerns.  Short Term Goals: Engage patient in aftercare planning with referrals and resources, Increase social support, Increase ability to appropriately verbalize feelings, Increase emotional regulation, Facilitate acceptance of mental health diagnosis and concerns, Facilitate patient progression through stages of change regarding substance use diagnoses and concerns, Identify triggers associated with mental health/substance abuse issues, and Increase skills for wellness and recovery  Therapeutic Interventions: Assess for all discharge needs, 1 to 1 time with Social worker, Explore available resources and support systems, Assess for adequacy in community support network, Educate family and significant other(s) on suicide prevention, Complete Psychosocial Assessment, Interpersonal group therapy.  Evaluation of Outcomes: Progressing   Progress in Treatment: Attending groups: No. Participating in groups: No. Taking medication as prescribed: Yes. Toleration medication: Yes. Family/Significant other contact made: No, will contact:  CSW will contact if given permission  Patient understands diagnosis: Yes. Discussing patient identified problems/goals with staff: Yes. Medical problems stabilized or resolved: Yes., No., and As evidenced by:  hospitalist's seeing pt for blood sugars according to psychiatrist  Denies suicidal/homicidal  ideation: Yes. Issues/concerns per patient self-inventory: No. Other: None   New problem(s) identified: No, Describe:  None identified   New Short Term/Long Term Goal(s): elimination of symptoms of psychosis, medication management for mood stabilization; elimination of SI thoughts; development of comprehensive mental wellness plan.   Patient Goals:  I don't know, get me on the right medicine to get me better   Discharge Plan or Barriers: CSW will assist with appropriate discharge planning   Reason for Continuation of Hospitalization: Depression Medication stabilization  Estimated Length of Stay: 1 to 7 days   Last 3 [place] Suicide Severity Risk Score: Flowsheet Row  Admission (Current) from 03/26/2024 in [facility] BEHAVIORAL MEDICINE Most recent reading at 03/27/2024  1:40 PM ED from 03/25/2024 in [facility] Emergency Department at [facility] Most recent reading at 03/25/2024  5:15 PM Office Visit from 03/25/2024 in [facility] Most recent reading at 03/25/2024  1:30 PM  C-SSRS RISK CATEGORY No Risk No Risk Moderate Risk    Last PHQ 2/9 Scores:    03/25/2024    1:30 PM 11/15/2023    2:19 PM 09/26/2023    3:42 PM  Depression screen PHQ 2/9  Decreased Interest 3 0 1  Down, Depressed, Hopeless 3 1 1   PHQ - 2 Score 6 1 2   Altered sleeping 3 0 1  Tired, decreased energy 2 3 1   Change in appetite 3 0 0  Feeling bad or failure about yourself  3 1 2   Trouble concentrating 2 1 0  Moving slowly or fidgety/restless 0 0 0  Suicidal thoughts 3 0 0  PHQ-9 Score 22 6  6    Difficult doing work/chores Very difficult Not difficult at all Somewhat difficult     Data saved with a previous flowsheet row definition    Scribe for Treatment Team: Lum JONETTA Croft, LCSWA 03/28/2024 11:24 AM

## 2024-03-28 NOTE — Assessment & Plan Note (Addendum)
 Patient on Norvasc , chlorthalidone , irbesartan  and Toprol 

## 2024-03-28 NOTE — Assessment & Plan Note (Signed)
BMI 33.77 

## 2024-03-28 NOTE — Progress Notes (Signed)
 SI/HI/AVH: denies all    Behavior/Mood: appropriate/depressed    Interaction/Group attendance: assertive/no groups   Medication/PRNs: complaint/none    Pain: denies  Other: educated pt to call nursing when wanting to suction but reinforcement is needed.

## 2024-03-28 NOTE — Inpatient Diabetes Management (Addendum)
 Inpatient Diabetes Program Recommendations  AACE/ADA: New Consensus Statement on Inpatient Glycemic Control (2015)  Target Ranges:  Prepandial:   less than 140 mg/dL      Peak postprandial:   less than 180 mg/dL (1-2 hours)      Critically ill patients:  140 - 180 mg/dL    Latest Reference Range & Units 03/27/24 05:33 03/27/24 07:52 03/27/24 10:29 03/27/24 11:47 03/27/24 16:47 03/27/24 18:41 03/27/24 20:05 03/27/24 21:54  Glucose-Capillary 70 - 99 mg/dL 688 (H)  5 units Novolog   355 (H)  22 units Novolog   406 (H) 314 (H)  21 units Novolog   16 units Semglee   65 (L) 233 (H)  Novolog  Meal Coverage HELD 293 (H)  3 units Novolog   413 (H)    16 units Semglee     Latest Reference Range & Units 03/28/24 07:24  Glucose-Capillary 70 - 99 mg/dL 581 (H)  20 units Novolog    (H): Data is abnormally high  Admit to [employer] Unit with Worsening Depression and Suicidality--Sent from OP   History: Type 1 diabetes   Home DM Meds: Insulin  Pump   Current Orders: Semglee  16 units BID                           Novolog  Moderate Correction Scale/ SSI (0-15 units) TID AC + HS                           Novolog  10 units TID with meals     Hypoglycemia at dinner time Novolog  Meal Coverage Held Pt ate dinner and CBG rose to >400 by bedtime Per RN, pt also allowed to have 8pm snack--Unsure how much food pt consumed last PM?? RN already gave the Novolog  10 units this AM with breakfast  Recommend the following:  1. Give 1 time dose of Novolog  10 units this AM for the CBG of 418  (This would mimic the correction factor pt uses on his insulin  pump 1:30 >110)  2. Increase Semglee  to 20 units BID (0.35 units/kg)  3. Increase the Novolog  Meal Coverage to 12 units TID  Orders reviewed with Dr. Jadapalle and permission given to this nurse to adjust the Insulin  orders as above Orders adjusted and reviewed with RN   --Will follow patient during hospitalization--  Adina Rudolpho Arrow  RN, MSN, CDCES Diabetes Coordinator Inpatient Glycemic Control Team Team Pager: [phone] (8a-5p)   Give N

## 2024-03-28 NOTE — Group Note (Signed)
 Recreation Therapy Group Note   Group Topic:Leisure Education  Group Date: 03/28/2024 Start Time: 1400 End Time: 1445 Facilitators: Celestia Jeoffrey BRAVO, LRT, CTRS Location: Dayroom  Group Description: Leisure. Patients were given the option to choose from journaling, coloring, drawing, making origami, playing with playdoh, listening to music or singing karaoke. LRT and pts discussed the meaning of leisure, the importance of participating in leisure during their free time/when they're outside of the hospital, as well as how our leisure interests can also serve as coping skills.   Goal Area(s) Addressed:  Patient will identify a current leisure interest.  Patient will learn the definition of "leisure". Patient will practice making a positive decision. Patient will have the opportunity to try a new leisure activity. Patient will communicate with peers and LRT.    Affect/Mood: N/A   Participation Level: Did not attend    Clinical Observations/Individualized Feedback: Patient did not attend group.  Plan: Continue to engage patient in RT group sessions 2-3x/week.   Jeoffrey BRAVO Celestia, LRT, CTRS 03/28/2024 4:38 PM

## 2024-03-28 NOTE — Progress Notes (Signed)
  Progress Note   Patient: [name] FMW:[id number] DOB: Apr 09, 1961 DOA: 03/26/2024     2 DOS: the patient was seen and examined on 03/28/2024   Brief hospital course: 63 y.o. male with past medical history of type 1 diabetes on insulin  pump, status post laryngectomy and tracheostomy, history of stroke, dyslipidemia, essential hypertension, hypothyroidism, major depression, OCD, who presents to the hospital for treating depression.  Insulin  pump was removed due to concern of suicidal ideation.  Currently glucose running high.   Symptomatically, patient has a depressed mood.  Otherwise no complaint.  11/21.  Sugars very elevated last night and this morning.  Uses insulin  pump at home.  This morning needed to give one-time dose of NovoLog  10 and increase Semglee  insulin  to 20 units twice daily, increased meal coverage to 12 units 3 times daily.    Assessment and Plan: * Uncontrolled type 1 diabetes mellitus with hyperglycemia, with long-term current use of insulin  (HCC) High sugars last night and again this morning.  1 dose of NovoLog  10 given.  Increased Semglee  insulin  to 20 units twice daily.  Increase meal coverage to 12 units 3 times daily plus sliding scale.  HTN (hypertension) Patient on Norvasc , chlorthalidone , irbesartan  and Toprol   Acquired hypothyroidism On levothyroxine   History of DVT (deep vein thrombosis) On Eliquis   Obesity (BMI 30-39.9) BMI 33.77  MDD (major depressive disorder), recurrent severe, without psychosis (HCC) Patient on Lexapro , Zyprexa  as per psychiatry.  Hyperlipidemia, unspecified On Crestor   Tracheostomy in place [facility]) Stable        Subjective: Patient concerns that his sugars have been elevated.  Normally He states it is under good control with his insulin  pump.  Physically feels okay.  Has depressed mood.  Physical Exam: Vitals:   03/27/24 0724 03/27/24 2010 03/28/24 0722 03/28/24 1027  BP: (!) 138/56 117/62 (!) 122/33   Pulse:  (!) 54 (!) 52 (!) 51 72  Resp: 18 20 18    Temp: 97.6 F (36.4 C) 98.4 F (36.9 C) (!) 97.1 F (36.2 C)   TempSrc:  Oral    SpO2: 95% 97% 94%   Weight:      Height:       Physical Exam HENT:     Head: Normocephalic.  Eyes:     General: Lids are normal.  Cardiovascular:     Rate and Rhythm: Normal rate and regular rhythm.     Heart sounds: Normal heart sounds, S1 normal and S2 normal.  Pulmonary:     Breath sounds: No decreased breath sounds, wheezing, rhonchi or rales.     Comments: Upper airway transmitted sounds Abdominal:     Palpations: Abdomen is soft.     Tenderness: There is no abdominal tenderness.  Musculoskeletal:     Right lower leg: No swelling.     Left Lower Extremity: Left leg is amputated below knee.  Skin:    General: Skin is warm.     Findings: No rash.  Neurological:     Mental Status: He is alert and oriented to person, place, and time.     Data Reviewed: Creatinine 1.18, electrolytes normal range, liver function test normal range, CBC normal range Last 3 sugars 305, 418 and 413 (most recent listed first)  Family Communication: As per psychiatry team  Disposition: As per psychiatry team  Planned Discharge Destination: Home    Time spent: 28 minutes  Author: Charlie Patterson, MD 03/28/2024 12:15 PM  For on call review [email or web address].

## 2024-03-28 NOTE — Assessment & Plan Note (Signed)
 On Crestor 

## 2024-03-28 NOTE — Group Note (Signed)
 Physical/Occupational Therapy Group Note  Group Topic: UE Therex   Group Date: 03/28/2024 Start Time: 1500 End Time: 1543 Facilitators: Lavonda Therisa CROME, OT   Group Description:  Group instructed in series of upper extremities exercises, aimed to promote strength, flexibility, range of motion and functional endurance.  Patients provided cuing for proper mechanics and proper pace of exercise; exercises adjusted as necessary for individualized patient needs.  Patient also engaged in cognitive components throughout session, working to integrate attention to task, command following, turn-taking and appropriate social interaction throughout session.  Allowed to ask questions as appropriate, and encouraged to identify specific exercises that they could complete independently outside of group sessions.     Therapeutic Goal(s):  * Demonstrate appropriate performance of upper extremity exercises to promote strength, flexibility, range of motion and functional endurance  * Identify 2-3 specific upper extremity exercises to complete as home exercise program outside of group session   Individual Participation: Pt did not attend   Participation Level: Did not attend   Participation Quality:    Behavior:    Speech/Thought Process:    Affect/Mood:    Insight:    Judgement:    Modes of Intervention:   Patient Response to Interventions:     Plan: Continue to engage patient in PT/OT groups 1 - 2x/week.  03/28/2024  Therisa CROME Lavonda, OT   Therisa Lavonda, OTD OTR/L  03/28/24, 4:07 PM

## 2024-03-28 NOTE — Group Note (Signed)
 Date:  03/28/2024 Time:  10:53 AM  Group Topic/Focus:  Personal Choices and Values:   The focus of this group is to help patients assess and explore the importance of values in their lives, how their values affect their decisions, how they express their values and what opposes their expression.    Participation Level:  Did Not Attend   [name] 03/28/2024, 10:53 AM

## 2024-03-28 NOTE — Plan of Care (Signed)
  Problem: Education: Goal: Utilization of techniques to improve thought processes will improve Outcome: Progressing Goal: Knowledge of the prescribed therapeutic regimen will improve Outcome: Progressing   Problem: Coping: Goal: Coping ability will improve Outcome: Progressing Goal: Will verbalize feelings Outcome: Progressing   Problem: Coping: Goal: Coping ability will improve Outcome: Progressing   Problem: Self-Concept: Goal: Ability to disclose and discuss suicidal ideas will improve Outcome: Progressing Goal: Will verbalize positive feelings about self Outcome: Progressing Note: Patient is on track. Patient will maintain adherence, avoid flare triggers, and be evaluated at upcoming provider appointment to assess progress

## 2024-03-29 ENCOUNTER — Inpatient Hospital Stay

## 2024-03-29 LAB — GLUCOSE, CAPILLARY
Glucose-Capillary: 199 mg/dL — ABNORMAL HIGH (ref 70–99)
Glucose-Capillary: 317 mg/dL — ABNORMAL HIGH (ref 70–99)
Glucose-Capillary: 239 mg/dL — ABNORMAL HIGH (ref 70–99)
Glucose-Capillary: 246 mg/dL — ABNORMAL HIGH (ref 70–99)

## 2024-03-29 MED ORDER — ESCITALOPRAM OXALATE 10 MG PO TABS
15.0000 mg | ORAL_TABLET | Freq: Every day | ORAL | Status: DC
  Administered 2024-03-29 – 2024-04-04 (×7): 15 mg via ORAL
  Filled 2024-03-29 (×7): qty 2

## 2024-03-29 MED ORDER — INSULIN ASPART 100 UNIT/ML IJ SOLN
8.0000 [IU] | Freq: Three times a day (TID) | INTRAMUSCULAR | Status: DC
  Administered 2024-03-29 (×2): 8 [IU] via SUBCUTANEOUS
  Filled 2024-03-29: qty 8

## 2024-03-29 MED ORDER — INSULIN ASPART 100 UNIT/ML IJ SOLN
12.0000 [IU] | Freq: Three times a day (TID) | INTRAMUSCULAR | Status: DC
  Administered 2024-03-29 – 2024-04-04 (×17): 12 [IU] via SUBCUTANEOUS
  Filled 2024-03-29 (×8): qty 12

## 2024-03-29 MED ORDER — BUPROPION HCL ER (XL) 300 MG PO TB24
450.0000 mg | ORAL_TABLET | Freq: Every day | ORAL | Status: DC
  Administered 2024-03-29 – 2024-03-31 (×3): 450 mg via ORAL
  Filled 2024-03-29 (×3): qty 1

## 2024-03-29 MED ORDER — DIPHENHYDRAMINE-ZINC ACETATE 2-0.1 % EX CREA
TOPICAL_CREAM | Freq: Three times a day (TID) | CUTANEOUS | Status: DC | PRN
  Filled 2024-03-29: qty 28

## 2024-03-29 NOTE — Progress Notes (Signed)
 Pt mother Johney Perotti took insulin  pump home on 03/29/24 at 2000. She provided signature on pt belonging sheet.

## 2024-03-29 NOTE — Progress Notes (Addendum)
 SI/HI/AVH: denies all    Behavior/Mood: appropriate/depressed    Interaction/Group attendance: assertive/1 of 2 groups   Medication/PRNs: compliant/benadryl  cream x 2   Pain: denies   Other: pt had a chest xray today to rule out pneumonia  Results:  EXAM: CHEST - 2 VIEW   COMPARISON:  Chest radiograph dated 05/21/2023.   FINDINGS: Mild interstitial and peribronchial densities may represent reactive airway disease or viral infection. No focal consolidation, effusion or pneumothorax. Stable cardiac silhouette. No acute osseous pathology.   IMPRESSION: No focal consolidation. Findings may represent reactive airway disease or viral infection.

## 2024-03-29 NOTE — Progress Notes (Signed)
 Progress Note   Patient: [name] FMW:[id number] DOB: 01-01-1961 DOA: 03/26/2024     3 DOS: the patient was seen and examined on 03/29/2024   Brief hospital course: 63 y.o. male with past medical history of type 1 diabetes on insulin  pump, status post laryngectomy and tracheostomy, history of stroke, dyslipidemia, essential hypertension, hypothyroidism, major depression, OCD, who presents to the hospital for treating depression.  Insulin  pump was removed due to concern of suicidal ideation.  Currently glucose running high.   Symptomatically, patient has a depressed mood.  Otherwise no complaint.  11/21.  Sugars very elevated last night and this morning.  Uses insulin  pump at home.  This morning needed to give one-time dose of NovoLog  10 and increase Semglee  insulin  to 20 units twice daily, increased meal coverage to 12 units 3 times daily.    Assessment and Plan: * Uncontrolled type 1 diabetes mellitus with hyperglycemia, with long-term current use of insulin  (HCC) Sugars on the lower side last night.  This morning sugars up again.  Continue increased dose of Semglee  insulin  to 20 units twice daily.  This morning I thought I had to go down on the insulin  dose prior to meals but will have to go back up to 12 units 3 times daily plus sliding scale.  Tracheostomy in place [facility]) Patient having transmitted wheeze.  Patient states that he usually has a speech therapy consult when this happens.  As for simplification to get his trach back in.  He is suctioning.  He declined empiric antibiotic.  No fever or chills.  Will get a chest x-ray.  HTN (hypertension) Patient on Norvasc , chlorthalidone , irbesartan  and Toprol   Acquired hypothyroidism On levothyroxine   History of DVT (deep vein thrombosis) On Eliquis   Obesity (BMI 30-39.9) BMI 33.77  MDD (major depressive disorder), recurrent severe, without psychosis (HCC) Patient on Lexapro , Zyprexa  as per psychiatry.  Hyperlipidemia,  unspecified On Crestor         Subjective: Patient states that he has problems with his trach and having trouble keeping it down.  He has been coughing and doing some suctioning.  I asked him what he normally does when he gets this way and he says he calls his speech therapist.  He asked for some lubrication to put his trach back in.  He suctioned.  He declined empiric antibiotic.  He states he nebulizers and inhalers do not work for him.  Physical Exam: Vitals:   03/28/24 0722 03/28/24 1027 03/28/24 1933 03/29/24 0731  BP: (!) 122/33  136/76 124/76  Pulse: (!) 51 72 61 60  Resp: 18  17 18   Temp: (!) 97.1 F (36.2 C)  97.7 F (36.5 C) 97.8 F (36.6 C)  TempSrc:      SpO2: 94%  98% 97%  Weight:      Height:       Physical Exam HENT:     Head: Normocephalic.  Eyes:     General: Lids are normal.  Cardiovascular:     Rate and Rhythm: Normal rate and regular rhythm.     Heart sounds: Normal heart sounds, S1 normal and S2 normal.  Pulmonary:     Breath sounds: No decreased breath sounds, wheezing, rhonchi or rales.     Comments: Upper airway transmitted sounds Abdominal:     Palpations: Abdomen is soft.     Tenderness: There is no abdominal tenderness.  Musculoskeletal:     Right lower leg: No swelling.     Left Lower Extremity: Left leg  is amputated below knee.  Skin:    General: Skin is warm.     Findings: No rash.  Neurological:     Mental Status: He is alert and oriented to person, place, and time.     Data Reviewed: Last 4 sugars 97, 99, 199 and 317    Disposition: As per psychiatry  Planned Discharge Destination: Home    Time spent: 28 minutes  Author: Charlie Patterson, MD 03/29/2024 1:04 PM  For on call review [email or web address].

## 2024-03-29 NOTE — [facility] Counselor (Signed)
 Adult Comprehensive Assessment  Patient ID: [name], male   DOB: 1960/08/27, 63 y.o.   MRN: [id number]  Information Source: Information source: Patient  Current Stressors:  Patient states their primary concerns and needs for treatment are:: Depression Patient states their goals for this hospitilization and ongoing recovery are:: I don't know. Educational / Learning stressors: none reported Employment / Job issues: Yes it bothers me that I can't work anymore due to my disability Family Relationships: none reported Surveyor, Quantity / Lack of resources (include bankruptcy): Well I could always use more money Housing / Lack of housing: none reported Physical health (include injuries & life threatening diseases): Legs and laryngectomy Social relationships: none reported Substance abuse: none reported Bereavement / Loss: none reported  Living/Environment/Situation:  Living Arrangements: Alone Living conditions (as described by patient or guardian): Comfortable but hard to get around in with wheelchair and walker Who else lives in the home?: none How long has patient lived in current situation?: 10 years What is atmosphere in current home: Comfortable  Family History:  Marital status: Widowed Widowed, when?: 3 years ago Are you sexually active?: No What is your sexual orientation?: Heterosexual Has your sexual activity been affected by drugs, alcohol, medication, or emotional stress?: No Does patient have children?: Yes How many children?: 4 How is patient's relationship with their children?: Two daughters and two sons, Distant, very distant. They're so involved with working and school and their families, they don't have time for me anymore. They all live away from here.  Childhood History:  By whom was/is the patient raised?: Both parents Additional childhood history information: Raised by both parents, It was a good childhood. Description of patient's relationship with  caregiver when they were a child: Mother - It was good. Father - Good. How were you disciplined when you got in trouble as a child/adolescent?: Pt reports non-physical discipline. Does patient have siblings?: Yes Number of Siblings: 1 Description of patient's current relationship with siblings: 1 brother and 1 sister, I don't really have a relationship with them. Me and my brother talk every once in awhile but he lives in [place], Did patient suffer any verbal/emotional/physical/sexual abuse as a child?: No Did patient suffer from severe childhood neglect?: No Has patient ever been sexually abused/assaulted/raped as an adolescent or adult?: No Was the patient ever a victim of a crime or a disaster?: No Witnessed domestic violence?: No Has patient been affected by domestic violence as an adult?: No  Education:  Highest grade of school patient has completed: 3 years of college Currently a consulting civil engineer?: No Learning disability?: No  Employment/Work Situation:   Employment Situation: On disability Why is Patient on Disability: Physical health issues How Long has Patient Been on Disability: 2015 Patient's Job has Been Impacted by Current Illness: No What is the Longest Time Patient has Held a Job?: 16 years Where was the Patient Employed at that Time?: [employer] Has Patient ever Been in the [employer]?: No  Financial Resources:   Financial resources: Insurance Claims Handler Does patient have a lawyer or guardian?: No  Alcohol/Substance Abuse:   What has been your use of drugs/alcohol within the last 12 months?: denies Alcohol/Substance Abuse Treatment Hx: Denies past history  Social Support System:   Forensic Psychologist System: Production Assistant, Radio System: His parents are still supportive of him and help take care of him Type of faith/religion: I beleive in God How does patient's faith help to cope with current illness?: Sometimes it helps. I pray every  day.  Leisure/Recreation:   Do You Have Hobbies?: No  Strengths/Needs:   What is the patient's perception of their strengths?: Hardworking, honest, Patient states they can use these personal strengths during their treatment to contribute to their recovery: Patient did not respond Patient states these barriers may affect/interfere with their treatment: None reported Patient states these barriers may affect their return to the community: none reported  Discharge Plan:   Currently receiving community mental health services: Yes (From Whom) Patient states concerns and preferences for aftercare planning are: To return to Dr. Bridget care at [facility] Patient states they will know when they are safe and ready for discharge when: I don't know. I feel safe now. Does patient have access to transportation?: Yes (Patient's mother will pick him up from here) Does patient have financial barriers related to discharge medications?: Yes Will patient be returning to same living situation after discharge?: Yes  Summary/Recommendations:   Summary and Recommendations (to be completed by the evaluator): .Patient is a 63 year old male from [place], [place] [facility]).   He reports that he receives SSDI and is no longer able to work due to a below knee amputation.  He presents to the hospital with worsening depression and active suicidal ideation.He has a primary diagnosis of  Major Depressive Disorder. A primary stressor for him is living alone and having difficulties moving around independently.   Recommendations include: crisis stabilization, therapeutic milieu, encourage group attendance and participation, medication management for detox/mood stabilization and development of comprehensive mental wellness/sobriety plan.   [name]. 03/29/2024

## 2024-03-29 NOTE — Progress Notes (Signed)
 Pt off site for a chest x-ray

## 2024-03-29 NOTE — Progress Notes (Signed)
Pt returns from chest x ray

## 2024-03-29 NOTE — Progress Notes (Signed)
 Rounded on patient. Patient seen in recreation room playing cards with other patients. No distress noted. Patient with lary tube in place. Continue to provide self care and states will have RN to call if needs anything. Room are sats noted in upper 90's.

## 2024-03-29 NOTE — Plan of Care (Signed)
  Problem: Education: Goal: Utilization of techniques to improve thought processes will improve Outcome: Progressing Goal: Knowledge of the prescribed therapeutic regimen will improve Outcome: Progressing   Problem: Coping: Goal: Coping ability will improve Outcome: Progressing   

## 2024-03-29 NOTE — Progress Notes (Signed)
 [facility] MD Progress Note  03/29/2024 1:05 PM [name]  MRN:  [id number] [name] is a 63 y.o. male admitted: Presented to the ED for 03/25/2024  5:37 PM for for worsening depression and active suicidal ideation in the setting of functional decline and multiple failed attempts to be evaluated during recent ED visits. He carries the psychiatric diagnoses of Major Depressive Disorder, recurrent severe, and has a past medical history of diabetes mellitus, bilateral below-knee amputations, tracheostomy, and difficulty adjusting to chronic medical disability. Patient is admitted to [facility] unit with Q15 min safety monitoring. Multidisciplinary team approach is offered. Medication management; group/milieu therapy is offered.  Subjective:  Chart reviewed, case discussed in multidisciplinary meeting, patient seen during rounds.   Patient met with the treatment team.  Patient talks about his multiple medical problems being a major trigger for his depression and anxiety.  He talks about since he got his above-knee amputation last year his life has become physically limited.  He identifies his mom being the main support.  He denies SI/HI/plan today.  He denies auditory/visual hallucinations.  Per nursing report patient is more sleepy in the mornings but is able to come out and engage in groups in the evening.  Provided discussed about maximizing the dosage of his current medications both Wellbutrin  and Lexapro .  He reports that these are the only 2 medications that has been tried also for.  Provider discussed considering a mood stabilizer if he does not see any improvement after optimization of dosage.  Past Psychiatric History: see h&P Family History:  Family History  Problem Relation Age of Onset   Osteoporosis Mother    Diabetes Mother    Hypertension Father    Mental illness Neg Hx    Social History:  Social History   Substance and Sexual Activity  Alcohol Use Yes   Alcohol/week: 2.0  standard drinks of alcohol   Types: 2 Shots of liquor per week     Social History   Substance and Sexual Activity  Drug Use No   Comment: Pt denied; UDS not available    Social History   Socioeconomic History   Marital status: Widowed    Spouse name: [name]   Number of children: Not on file   Years of education: Not on file   Highest education level: Not on file  Occupational History   Not on file  Tobacco Use   Smoking status: Former    Current packs/day: 0.00    Types: Cigarettes    Quit date: 04/09/2013    Years since quitting: 10.9   Smokeless tobacco: Never  Vaping Use   Vaping status: Never Used  Substance and Sexual Activity   Alcohol use: Yes    Alcohol/week: 2.0 standard drinks of alcohol    Types: 2 Shots of liquor per week   Drug use: No    Comment: Pt denied; UDS not available   Sexual activity: Yes    Partners: Female    Birth control/protection: Condom  Other Topics Concern   Not on file  Social History Narrative   Not on file   Social Drivers of Health   Financial Resource Strain: Low Risk  (12/19/2023)   Received from [facility]   Overall Financial Resource Strain (CARDIA)    Difficulty of Paying Living Expenses: Not hard at all  Food Insecurity: No Food Insecurity (03/26/2024)   Hunger Vital Sign    Worried About Running Out of Food in the Last  Year: Never true    Ran Out of Food in the Last Year: Never true  Transportation Needs: No Transportation Needs (03/26/2024)   PRAPARE - Administrator, Civil Service (Medical): No    Lack of Transportation (Non-Medical): No  Physical Activity: Sufficiently Active (07/12/2023)   Exercise Vital Sign    Days of Exercise per Week: 7 days    Minutes of Exercise per Session: 30 min  Stress: Stress Concern Present (07/12/2023)   [employer] of Occupational Health - Occupational Stress Questionnaire    Feeling of Stress : Very much  Social Connections: Socially Isolated  (07/12/2023)   Social Connection and Isolation Panel    Frequency of Communication with Friends and Family: More than three times a week    Frequency of Social Gatherings with Friends and Family: Never    Attends Religious Services: Never    Database Administrator or Organizations: No    Attends Banker Meetings: Never    Marital Status: Widowed   Past Medical History:  Past Medical History:  Diagnosis Date   Depression    Diabetes (HCC)    Insulin  Pump   Diabetes mellitus type I (HCC)    Diabetes mellitus without complication (HCC)    GERD (gastroesophageal reflux disease)    H/O laryngectomy    Heel bone fracture    History of embolic stroke    Hyperlipidemia    Hypertension    Radicular pain of right lower extremity    Stroke (HCC)    Suicide attempt (HCC) 2014   damaged larynx - tracheostomy   Thyroid  disease     Past Surgical History:  Procedure Laterality Date   COLONOSCOPY WITH PROPOFOL  N/A 05/15/2018   Procedure: COLONOSCOPY WITH PROPOFOL ;  Surgeon: [name], Ladell POUR, MD;  Location: [facility] ENDOSCOPY;  Service: Gastroenterology;  Laterality: N/A;   ESOPHAGOGASTRODUODENOSCOPY N/A 05/15/2018   Procedure: ESOPHAGOGASTRODUODENOSCOPY (EGD);  Surgeon: [name], Ladell POUR, MD;  Location: [facility] ENDOSCOPY;  Service: Gastroenterology;  Laterality: N/A;   FRACTURE SURGERY     Heel bone reconstruction Left    HERNIA REPAIR  02/2011   Umbilical hernia repair    LARYNGECTOMY     LOWER EXTREMITY ANGIOGRAPHY Left 01/31/2021   Procedure: LOWER EXTREMITY ANGIOGRAPHY;  Surgeon: Marea Selinda RAMAN, MD;  Location: [facility] INVASIVE CV LAB;  Service: Cardiovascular;  Laterality: Left;   LOWER EXTREMITY ANGIOGRAPHY Right 02/14/2021   Procedure: LOWER EXTREMITY ANGIOGRAPHY;  Surgeon: Marea Selinda RAMAN, MD;  Location: [facility] INVASIVE CV LAB;  Service: Cardiovascular;  Laterality: Right;   NECK SURGERY     fusion   SPINE SURGERY     TRACHEOSTOMY  2014   from SI attempt    Current  Medications: Current Facility-Administered Medications  Medication Dose Route Frequency Provider Last Rate Last Admin   acetaminophen  (TYLENOL ) tablet 650 mg  650 mg Oral Q6H PRN Smith, Annie B, NP   650 mg at 03/26/24 2101   alum & mag hydroxide-simeth (MAALOX/MYLANTA) 200-200-20 MG/5ML suspension 30 mL  30 mL Oral Q4H PRN Smith, Annie B, NP       amLODipine  (NORVASC ) tablet 5 mg  5 mg Oral Daily Smith, Annie B, NP   5 mg at 03/29/24 0957   apixaban  (ELIQUIS ) tablet 5 mg  5 mg Oral BID Smith, Annie B, NP   5 mg at 03/29/24 9041   buPROPion  (WELLBUTRIN  XL) 24 hr tablet 450 mg  450 mg Oral Daily Jadapalle, Sree, MD   450 mg at  03/29/24 0958   chlorthalidone  (HYGROTON ) tablet 25 mg  25 mg Oral Daily Smith, Annie B, NP   25 mg at 03/29/24 9041   clonazePAM  (KLONOPIN ) tablet 1 mg  1 mg Oral Daily PRN Smith, Annie B, NP   1 mg at 03/28/24 2127   diphenhydrAMINE -zinc  acetate (BENADRYL ) 2-0.1 % cream   Topical TID PRN Jadapalle, Sree, MD   Given at 03/29/24 1000   escitalopram  (LEXAPRO ) tablet 15 mg  15 mg Oral Daily Jadapalle, Sree, MD   15 mg at 03/29/24 0957   insulin  aspart (novoLOG ) injection 0-15 Units  0-15 Units Subcutaneous TID WC Smith, Annie B, NP   11 Units at 03/29/24 1240   insulin  aspart (novoLOG ) injection 0-5 Units  0-5 Units Subcutaneous QHS Smith, Annie B, NP   3 Units at 03/27/24 2012   insulin  aspart (novoLOG ) injection 12 Units  12 Units Subcutaneous TID WC Josette Ade, MD       insulin  glargine-yfgn (SEMGLEE ) injection 20 Units  20 Units Subcutaneous BID Jadapalle, Sree, MD   20 Units at 03/29/24 9040   irbesartan  (AVAPRO ) tablet 300 mg  300 mg Oral Daily Smith, Annie B, NP   300 mg at 03/29/24 9041   levothyroxine  (SYNTHROID ) tablet 75 mcg  75 mcg Oral Q0600 Smith, Annie B, NP   75 mcg at 03/29/24 9373   magnesium  hydroxide (MILK OF MAGNESIA) suspension 30 mL  30 mL Oral Daily PRN Smith, Annie B, NP       metoprolol  succinate (TOPROL -XL) 24 hr tablet 25 mg  25 mg Oral Daily  Smith, Annie B, NP   25 mg at 03/29/24 9041   OLANZapine  (ZYPREXA ) injection 5 mg  5 mg Intramuscular TID PRN Smith, Annie B, NP       OLANZapine  (ZYPREXA ) tablet 10 mg  10 mg Oral QHS Smith, Annie B, NP   10 mg at 03/28/24 2127   OLANZapine  zydis (ZYPREXA ) disintegrating tablet 5 mg  5 mg Oral TID PRN Smith, Annie B, NP       ondansetron  (ZOFRAN -ODT) disintegrating tablet 4 mg  4 mg Oral Q8H PRN Smith, Annie B, NP       pantoprazole  (PROTONIX ) EC tablet 40 mg  40 mg Oral Daily Smith, Annie B, NP   40 mg at 03/29/24 0757   rosuvastatin  (CRESTOR ) tablet 20 mg  20 mg Oral QHS Smith, Annie B, NP   20 mg at 03/28/24 2127   zolpidem  (AMBIEN ) tablet 5 mg  5 mg Oral QHS PRN Smith, Annie B, NP   5 mg at 03/28/24 2127    Lab Results:  Results for orders placed or performed during the hospital encounter of 03/26/24 (from the past 48 hours)  Glucose, capillary     Status: Abnormal   Collection Time: 03/27/24  4:47 PM  Result Value Ref Range   Glucose-Capillary 65 (L) 70 - 99 mg/dL    Comment: Glucose reference range applies only to samples taken after fasting for at least 8 hours.  Glucose, capillary     Status: Abnormal   Collection Time: 03/27/24  6:41 PM  Result Value Ref Range   Glucose-Capillary 233 (H) 70 - 99 mg/dL    Comment: Glucose reference range applies only to samples taken after fasting for at least 8 hours.  Glucose, capillary     Status: Abnormal   Collection Time: 03/27/24  8:05 PM  Result Value Ref Range   Glucose-Capillary 293 (H) 70 - 99 mg/dL  Comment: Glucose reference range applies only to samples taken after fasting for at least 8 hours.  Glucose, capillary     Status: Abnormal   Collection Time: 03/27/24  9:54 PM  Result Value Ref Range   Glucose-Capillary 413 (H) 70 - 99 mg/dL    Comment: Glucose reference range applies only to samples taken after fasting for at least 8 hours.  Glucose, capillary     Status: Abnormal   Collection Time: 03/28/24  7:24 AM  Result  Value Ref Range   Glucose-Capillary 418 (H) 70 - 99 mg/dL    Comment: Glucose reference range applies only to samples taken after fasting for at least 8 hours.  Glucose, capillary     Status: Abnormal   Collection Time: 03/28/24 11:44 AM  Result Value Ref Range   Glucose-Capillary 305 (H) 70 - 99 mg/dL    Comment: Glucose reference range applies only to samples taken after fasting for at least 8 hours.  Glucose, capillary     Status: None   Collection Time: 03/28/24  4:41 PM  Result Value Ref Range   Glucose-Capillary 97 70 - 99 mg/dL    Comment: Glucose reference range applies only to samples taken after fasting for at least 8 hours.  Glucose, capillary     Status: None   Collection Time: 03/28/24  8:26 PM  Result Value Ref Range   Glucose-Capillary 99 70 - 99 mg/dL    Comment: Glucose reference range applies only to samples taken after fasting for at least 8 hours.  Glucose, capillary     Status: Abnormal   Collection Time: 03/29/24  7:26 AM  Result Value Ref Range   Glucose-Capillary 199 (H) 70 - 99 mg/dL    Comment: Glucose reference range applies only to samples taken after fasting for at least 8 hours.  Glucose, capillary     Status: Abnormal   Collection Time: 03/29/24 11:30 AM  Result Value Ref Range   Glucose-Capillary 317 (H) 70 - 99 mg/dL    Comment: Glucose reference range applies only to samples taken after fasting for at least 8 hours.    Blood Alcohol level:  Lab Results  Component Value Date   [facility] <15 03/25/2024   ETH <10 01/21/2019    Metabolic Disorder Labs: Lab Results  Component Value Date   HGBA1C 7.8 (H) 03/25/2024   MPG 177.16 03/25/2024   MPG 165.68 10/28/2022   No results found for: PROLACTIN Lab Results  Component Value Date   CHOL 161 09/15/2016   TRIG 190 (H) 09/15/2016   HDL 41 09/15/2016   CHOLHDL 3.9 09/15/2016   VLDL 38 09/15/2016   LDLCALC 82 09/15/2016    Physical Findings: AIMS:  , ,  ,  ,    CIWA:    COWS:       Psychiatric Specialty Exam:  Presentation  General Appearance:  Casual  Eye Contact: Fleeting  Speech: Slow  Speech Volume: Decreased    Mood and Affect  Mood: Anxious; Depressed  Affect: Depressed; Flat   Thought Process  Thought Processes: Coherent  Orientation:Full (Time, Place and Person)  Thought Content:Logical  Hallucinations:No data recorded  Ideas of Reference:None  Suicidal Thoughts:No data recorded  Homicidal Thoughts:No data recorded   Sensorium  Memory: Immediate Fair; Remote Fair  Judgment: Impaired  Insight: Shallow   Executive Functions  Concentration: Poor  Attention Span: Poor  Recall: [employer] of Knowledge: Fair  Language: Fair   Psychomotor Activity  Psychomotor Activity: No  data recorded  Musculoskeletal: Strength & Muscle Tone: decreased Gait & Station: unsteady Assets  Assets: Manufacturing Systems Engineer; Desire for Improvement    Physical Exam: Physical Exam ROS Blood pressure 124/76, pulse 60, temperature 97.8 F (36.6 C), resp. rate 18, height 6' (1.829 m), weight 112.9 kg, SpO2 97%. Body mass index is 33.77 kg/m.  Diagnosis: Principal Problem:   Uncontrolled type 1 diabetes mellitus with hyperglycemia, with long-term current use of insulin  (HCC) Active Problems:   Acquired hypothyroidism   HTN (hypertension)   Tracheostomy in place (HCC)   Hyperlipidemia, unspecified   MDD (major depressive disorder), recurrent severe, without psychosis (HCC)   Obesity (BMI 30-39.9)   History of DVT (deep vein thrombosis)   Clinical Decision Making: Patient currently admitted with worsening depression and passive suicidal ideation in the context of multiple medical problems.  Patient will be monitored closely for safety  Treatment Plan Summary:  Safety and Monitoring:             -- Voluntary admission to inpatient psychiatric unit for safety, stabilization and treatment             -- Daily contact  with patient to assess and evaluate symptoms and progress in treatment             -- Patient's case to be discussed in multi-disciplinary team meeting             -- Observation Level: q15 minute checks             -- Vital signs:  q12 hours             -- Precautions: suicide, elopement, and assault   2. Psychiatric Diagnoses and Treatment:              Wellbutrin  XL 300 mg daily-increase to 450 mg Klonopin  1mg  daily PRN Lexapro  10 mg daily increase it to 15 mg   zaleplon  10 mg qhs   -- The risks/benefits/side-effects/alternatives to this medication were discussed in detail with the patient and time was given for questions. The patient consents to medication trial.                -- Metabolic profile and EKG monitoring obtained while on an atypical antipsychotic (BMI: Lipid Panel: HbgA1c: QTc:)              -- Encouraged patient to participate in unit milieu and in scheduled group therapies  3. Medical Issues Being Addressed:   Hospitalist and diabetic educator consult placed: * Uncontrolled type 1 diabetes mellitus with hyperglycemia, with long-term current use of insulin  (HCC) High sugars last night and again this morning.  1 dose of NovoLog  10 given.  Increased Semglee  insulin  to 20 units twice daily.  Increase meal coverage to 12 units 3 times daily plus sliding scale.   HTN (hypertension) Patient on Norvasc , chlorthalidone , irbesartan  and Toprol    Acquired hypothyroidism On levothyroxine    History of DVT (deep vein thrombosis) On Eliquis  4. Discharge Planning:   -- Social work and case management to assist with discharge planning and identification of hospital follow-up needs prior to discharge  -- Estimated LOS: 3-4 days  Millie JONELLE Manners, MD 03/29/2024, 1:05 PM

## 2024-03-29 NOTE — Progress Notes (Addendum)
 Patient complained of cough. Hospitalist to rule out pneumonia.  Chest Xray results are as follows:   EXAM: CHEST - 2 VIEW   COMPARISON:  Chest radiograph dated 05/21/2023.   FINDINGS: Mild interstitial and peribronchial densities may represent reactive airway disease or viral infection. No focal consolidation, effusion or pneumothorax. Stable cardiac silhouette. No acute osseous pathology.   IMPRESSION: No focal consolidation. Findings may represent reactive airway disease or viral infection.

## 2024-03-29 NOTE — Progress Notes (Addendum)
 Pt A&Ox4. He endorses passive SI with no plan. Pt verbally contracts for safety. Denies HI/AVH at this time. He reports anxiety 9/10, depression, and trouble sleeping. PRN Klonopin  1mg , and Ambien  5mg  administered as per order, which were effective. He is seen in the dayroom interacting with others. He denies pain. He is med compliant and safe on the unit at this time with Q15 min safety checks in place.

## 2024-03-29 NOTE — Group Note (Signed)
 Date:  03/29/2024 Time:  4:15 PM  Group Topic/Focus:  Emotional Education:   The focus of this group is to discuss what feelings/emotions are, and how they are experienced.    Participation Level:  Did Not Attend  Arland Nutting 03/29/2024, 4:15 PM

## 2024-03-29 NOTE — Plan of Care (Signed)
  Problem: Education: Goal: Knowledge about tracheostomy care/management will improve Outcome: Progressing   Problem: Activity: Goal: Ability to tolerate increased activity will improve Outcome: Progressing

## 2024-03-29 NOTE — Group Note (Signed)
 Date:  03/29/2024 Time:  12:23 PM  Group Topic/Focus:  Making Healthy Choices:   The focus of this group is to help patients identify negative/unhealthy choices they were using prior to admission and identify positive/healthier coping strategies to replace them upon discharge.    Participation Level:  Active  Participation Quality:  Appropriate  Affect:  Appropriate  Cognitive:  Alert  Insight: Appropriate  Engagement in Group:  Engaged  Modes of Intervention:  Activity and Discussion  Additional Comments:     Maglione,Shayna Eblen E 03/29/2024, 12:23 PM

## 2024-03-30 DIAGNOSIS — R7989 Other specified abnormal findings of blood chemistry: Secondary | ICD-10-CM

## 2024-03-30 LAB — GLUCOSE, CAPILLARY
Glucose-Capillary: 175 mg/dL — ABNORMAL HIGH (ref 70–99)
Glucose-Capillary: 196 mg/dL — ABNORMAL HIGH (ref 70–99)
Glucose-Capillary: 157 mg/dL — ABNORMAL HIGH (ref 70–99)
Glucose-Capillary: 209 mg/dL — ABNORMAL HIGH (ref 70–99)

## 2024-03-30 LAB — CBC
HCT: 41.8 % (ref 39.0–52.0)
Hemoglobin: 14.1 g/dL (ref 13.0–17.0)
MCH: 29.9 pg (ref 26.0–34.0)
MCHC: 33.7 g/dL (ref 30.0–36.0)
MCV: 88.7 fL (ref 80.0–100.0)
Platelets: 269 K/uL (ref 150–400)
RBC: 4.71 MIL/uL (ref 4.22–5.81)
RDW: 13.1 % (ref 11.5–15.5)
WBC: 5.2 K/uL (ref 4.0–10.5)
nRBC: 0 % (ref 0.0–0.2)

## 2024-03-30 LAB — BASIC METABOLIC PANEL WITH GFR
Anion gap: 9 (ref 5–15)
BUN: 42 mg/dL — ABNORMAL HIGH (ref 8–23)
CO2: 26 mmol/L (ref 22–32)
Calcium: 9.2 mg/dL (ref 8.9–10.3)
Chloride: 99 mmol/L (ref 98–111)
Creatinine, Ser: 1.46 mg/dL — ABNORMAL HIGH (ref 0.61–1.24)
GFR, Estimated: 54 mL/min — ABNORMAL LOW (ref >60)
Glucose, Bld: 273 mg/dL — ABNORMAL HIGH (ref 70–99)
Potassium: 4.2 mmol/L (ref 3.5–5.1)
Sodium: 135 mmol/L (ref 135–145)

## 2024-03-30 MED ORDER — HYDROCORTISONE 1 % EX CREA
TOPICAL_CREAM | Freq: Two times a day (BID) | CUTANEOUS | Status: AC
Start: 2024-03-30 — End: 2024-04-04
  Administered 2024-03-30 – 2024-04-03 (×3): 1 via TOPICAL
  Filled 2024-03-30: qty 28

## 2024-03-30 NOTE — Progress Notes (Addendum)
 SLP Cancellation Note  Patient Details Name: [name] MRN: [id number] DOB: 04/01/1961   Cancelled treatment:       Reason Eval/Treat Not Completed: SLP screened, no needs identified, will sign off (chart reviewed; consulted w/ NSG and met w/ pt in room. MD updated.)   Post chart review and consulting NSG, NSG reported pt has had increased tracheal secretions and has required suctioning at admit by NSG, now in room. Pt has suctioning equipment set up; he is able to self-suction independently. He endorsed he felt he had more mucous which impacted his breathing at admit but it's better now that I am suctioning.   Per chart and pt, pt has had a laryngectomy w/ tracheostoma. He uses a LaryTube + HME combination on the tracheostoma. He uses TEP speech w/ fluent verbal communication noted.  OF NOTE: in 4-03/2024, pt was seen at the [facility] on several occasions re: his voice prosthetic. At that time, there was Notation in the chart of Patient arrived in clinic due to contiuing granulation tissue burden and feeling air resistance to breathing esp. when lying on side. Distal redundant protrusions again noted along bilateral tracheal wall not obstructive to breathing. Further, mild redundant tissue (granulation tissue) encroaching over left side of TEP prosthesis flange remaining.; also, he was recommended to f/u for tracheal wall granulation and possible tracheitis effect; ENT f/u.   Recommend continue w/ current suctioning PRN and f/u w/ [facility] w/ any further needs re: the tracheostoma and LaryTube use/fitting and TEP speech. Pt agreed. NSG updated. MD updated via secure chat.      Joel Portugal, MS, CCC-SLP Speech Language Pathologist Rehab Services; [facility] [phone] (ascom) Chamberlain Steinborn 03/30/2024, 11:43 AM

## 2024-03-30 NOTE — Progress Notes (Signed)
 S: Situation Patient reports occasional bleeding from the tracheostoma. No visible bleeding observed by nurse during assessment. Patient denies any difficulty breathing or respiratory distress at this time.  B: Background Patient is an independent tracheostomy care and suctioning provider. The patient performs their own tracheostomy care, including self-suctioning. There is no history of recent respiratory infections or complications. No prior incidents of significant bleeding have been noted.  A: Assessment  Airway: Patent and clear, no signs of obstruction or difficulty breathing.  Breathing: Normal, no respiratory distress. SpO2 within normal range 93%.  Tracheostoma Site: No visible active bleeding or signs of infection. The patient reports occasional mild bleeding, but no current active bleeding observed.  Self-Care: Patient is performing suctioning independently, but there are concerns that excessive suction depth may be contributing to the bleeding.  R: Recommendation  Continue to monitor the patient for any further signs of bleeding or respiratory difficulty.  Reinforce patient education regarding proper suctioning techniques, especially avoiding excessive suction depth.  Follow-up with respiratory therapy to review technique, and continue patient education on tracheostomy care.  If bleeding persists or worsens, recommend further assessment by respiratory therapy or a physician.

## 2024-03-30 NOTE — Progress Notes (Signed)
   03/30/24 1200  Psych Admission Type (Psych Patients Only)  Admission Status Voluntary  Psychosocial Assessment  Patient Complaints Anxiety;Depression  Eye Contact Fair  Facial Expression Sad  Affect Sad  Speech Logical/coherent  Interaction Assertive  Motor Activity Slow  Appearance/Hygiene Unremarkable  Behavior Characteristics Cooperative  Mood Pleasant  Thought Process  Coherency WDL  Content WDL  Delusions None reported or observed  Perception WDL  Hallucination None reported or observed  Judgment Impaired  Confusion None  Danger to Self  Current suicidal ideation? Denies  Agreement Not to Harm Self Yes  Description of Agreement verbal  Danger to Others  Danger to Others None reported or observed

## 2024-03-30 NOTE — Group Note (Deleted)
 [facility] LCSW Group Therapy Note    Group Date: 03/30/2024 Start Time: 1000 End Time: 1030  Type of Therapy and Topic:  Group Therapy:  Overcoming Obstacles  Participation Level:  [facility] PARTICIPATION LEVEL: Did Not Attend  Mood:  Description of Group:   In this group patients will be encouraged to explore what they see as obstacles to their own wellness and recovery. They will be guided to discuss their thoughts, feelings, and behaviors related to these obstacles. The group will process together ways to cope with barriers, with attention given to specific choices patients can make. Each patient will be challenged to identify changes they are motivated to make in order to overcome their obstacles. This group will be process-oriented, with patients participating in exploration of their own experiences as well as giving and receiving support and challenge from other group members.  Therapeutic Goals: 1. Patient will identify personal and current obstacles as they relate to admission. 2. Patient will identify barriers that currently interfere with their wellness or overcoming obstacles.  3. Patient will identify feelings, thought process and behaviors related to these barriers. 4. Patient will identify two changes they are willing to make to overcome these obstacles:    Summary of Patient Progress   Pt did not attend group.   Therapeutic Modalities:   Cognitive Behavioral Therapy Solution Focused Therapy Motivational Interviewing Relapse Prevention Therapy   Rexene LELON Mae, LCSWA

## 2024-03-30 NOTE — Progress Notes (Signed)
 Pt is A&Ox4. He denies SI/HI/AVH this shift. He denies physical complaints. He endorsed anxiety rated 9/10 and depression r/t medical conditions, and trouble sleeping. PRN Klonopin  1 mg and Ambien  5 mg given as per order, which were effective. He is safe on the unit at this time with Q15 min safety checks in place

## 2024-03-30 NOTE — Progress Notes (Signed)
 Progress Note   Patient: [name] FMW:[id number] DOB: [birth year] DOA: 03/26/2024     4 DOS: the patient was seen and examined on 03/30/2024   Brief hospital course: 63 y.o. male with past medical history of type 1 diabetes on insulin  pump, status post laryngectomy and tracheostomy, history of stroke, dyslipidemia, essential hypertension, hypothyroidism, major depression, OCD, who presents to the hospital for treating depression.  Insulin  pump was removed due to concern of suicidal ideation.  Currently glucose running high.   Symptomatically, patient has a depressed mood.  Otherwise no complaint.  11/21.  Sugars very elevated last night and this morning.  Uses insulin  pump at home.  This morning needed to give one-time dose of NovoLog  10 and increase Semglee  insulin  to 20 units twice daily, increased meal coverage to 12 units 3 times daily. 11/22.  Patient states he is having a lot more trouble breathing with his trach.  He is suctioning himself.  He states he is not getting enough food.    Assessment and Plan: * Uncontrolled type 1 diabetes mellitus with hyperglycemia, with long-term current use of insulin  (HCC) Continue increased dose of Semglee  insulin  to 20 units twice daily.  Continue short acting insulin  12 units 3 times daily plus sliding scale.  Tracheostomy in place [facility]) Patient having transmitted wheeze.  Patient breathing little bit better than yesterday.  Chest x-ray negative.  Hold off on antibiotics.  HTN (hypertension) Patient on Norvasc , and Toprol .  Will hold Hygroton  and irbesartan  secondary to elevation in creatinine today.  Acquired hypothyroidism On levothyroxine   Elevated serum creatinine Today's creatinine 1.46.  Creatinine on presentation 1.18.  Advised to drink fluid.  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  Currently does not meet criteria for acute kidney injury.  Hold Hygroton  and Avapro  for now.  Encourage patient to drink.  History of  DVT (deep vein thrombosis) On Eliquis   Obesity (BMI 30-39.9) BMI 33.77  MDD (major depressive disorder), recurrent severe, without psychosis (HCC) Patient on Lexapro , Zyprexa  as per psychiatry.  Hyperlipidemia, unspecified On Crestor         Subjective: Patient awakened from sleep late morning.  States he did not sleep without well last night.  Coughing less today and breathing a little bit better.  Admitted to the psychiatry floor for depression.  Physical Exam: Vitals:   03/29/24 2054 03/29/24 2129 03/30/24 0712 03/30/24 0949  BP: 129/72  (!) 117/39 (!) 112/53  Pulse: 60 69 (!) 56 63  Resp:  20 16   Temp:   (!) 97.5 F (36.4 C)   TempSrc:      SpO2: 98% 97% 93%   Weight:      Height:       Physical Exam HENT:     Head: Normocephalic.  Eyes:     General: Lids are normal.  Cardiovascular:     Rate and Rhythm: Normal rate and regular rhythm.     Heart sounds: Normal heart sounds, S1 normal and S2 normal.  Pulmonary:     Breath sounds: No decreased breath sounds, wheezing, rhonchi or rales.     Comments: Upper airway transmitted sounds Abdominal:     Palpations: Abdomen is soft.     Tenderness: There is no abdominal tenderness.  Musculoskeletal:     Right lower leg: No swelling.     Left Lower Extremity: Left leg is amputated below knee.  Skin:    General: Skin is warm.     Comments: Slight redness right antecubital  Neurological:     Mental Status: He is alert and oriented to person, place, and time.     Data Reviewed: Creatinine 1.46 with a GFR 54, BUN 42, glucose 273 on chemistry CBC normal range  Disposition: As per psychiatry team  Planned Discharge Destination: Home    Time spent: 27 minutes  Author: Charlie Patterson, MD 03/30/2024 11:39 AM  For on call review [email or web address].

## 2024-03-30 NOTE — Group Note (Signed)
 [facility] LCSW Group Therapy Note    Group Date: 03/30/2024 Start Time: 1000 End Time: 1030  Type of Therapy and Topic:  Group Therapy:  Overcoming Obstacles  Participation Level:  [facility] PARTICIPATION LEVEL: Did Not Attend  Mood:  Description of Group:   In this group patients will be encouraged to explore what they see as obstacles to their own wellness and recovery. They will be guided to discuss their thoughts, feelings, and behaviors related to these obstacles. The group will process together ways to cope with barriers, with attention given to specific choices patients can make. Each patient will be challenged to identify changes they are motivated to make in order to overcome their obstacles. This group will be process-oriented, with patients participating in exploration of their own experiences as well as giving and receiving support and challenge from other group members.  Therapeutic Goals: 1. Patient will identify personal and current obstacles as they relate to admission. 2. Patient will identify barriers that currently interfere with their wellness or overcoming obstacles.  3. Patient will identify feelings, thought process and behaviors related to these barriers. 4. Patient will identify two changes they are willing to make to overcome these obstacles:    Summary of Patient Progress   Pt did not attend group.   Therapeutic Modalities:   Cognitive Behavioral Therapy Solution Focused Therapy Motivational Interviewing Relapse Prevention Therapy   Rexene LELON Mae, LCSWA

## 2024-03-30 NOTE — Group Note (Signed)
 Date:  03/30/2024 Time:  4:06 PM  Group Topic/Focus:  Managing Feelings:   The focus of this group is to identify what feelings patients have difficulty handling and develop a plan to handle them in a healthier way upon discharge.    Participation Level:  Did Not Attend   [name] 03/30/2024, 4:06 PM

## 2024-03-30 NOTE — Assessment & Plan Note (Addendum)
 Today's creatinine 1.46.  Creatinine on presentation 1.18.  Advised to drink fluid.  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  Currently does not meet criteria for acute kidney injury.  Hold Hygroton  and Avapro  for now.  Encourage patient to drink.

## 2024-03-30 NOTE — Plan of Care (Signed)
  Problem: Coping: Goal: Coping ability will improve Outcome: Progressing   Problem: Self-Concept: Goal: Ability to disclose and discuss suicidal ideas will improve Outcome: Progressing Goal: Will verbalize positive feelings about self Outcome: Progressing Note:

## 2024-03-30 NOTE — Group Note (Signed)
 Date:  03/30/2024 Time:  8:53 PM  Group Topic/Focus:  Wrap-Up Group:   The focus of this group is to help patients review their daily goal of treatment and discuss progress on daily workbooks.    Participation Level:  Active  Participation Quality:  Appropriate  Affect:  Appropriate  Cognitive:  Alert  Insight: Appropriate  Engagement in Group:  Engaged  Modes of Intervention:  Discussion  Additional Comments:    Tommas [name] 03/30/2024, 8:53 PM

## 2024-03-30 NOTE — Plan of Care (Signed)
  Problem: Education: Goal: Utilization of techniques to improve thought processes will improve Outcome: Progressing Goal: Knowledge of the prescribed therapeutic regimen will improve Outcome: Progressing   Problem: Coping: Goal: Coping ability will improve Outcome: Progressing   

## 2024-03-31 LAB — GLUCOSE, CAPILLARY
Glucose-Capillary: 255 mg/dL — ABNORMAL HIGH (ref 70–99)
Glucose-Capillary: 260 mg/dL — ABNORMAL HIGH (ref 70–99)
Glucose-Capillary: 197 mg/dL — ABNORMAL HIGH (ref 70–99)
Glucose-Capillary: 238 mg/dL — ABNORMAL HIGH (ref 70–99)

## 2024-03-31 MED ORDER — NYSTATIN 100000 UNIT/GM EX CREA
TOPICAL_CREAM | Freq: Two times a day (BID) | CUTANEOUS | Status: DC
  Administered 2024-04-02: 1 via TOPICAL
  Filled 2024-03-31 (×2): qty 30

## 2024-03-31 MED ORDER — QUETIAPINE FUMARATE 25 MG PO TABS
50.0000 mg | ORAL_TABLET | Freq: Every day | ORAL | Status: DC
  Administered 2024-03-31 – 2024-04-01 (×2): 50 mg via ORAL
  Filled 2024-03-31 (×2): qty 2

## 2024-03-31 MED ORDER — LOPERAMIDE HCL 2 MG PO CAPS
4.0000 mg | ORAL_CAPSULE | ORAL | Status: DC | PRN
Start: 2024-03-31 — End: 2024-04-04
  Administered 2024-03-31 – 2024-04-02 (×3): 4 mg via ORAL
  Filled 2024-03-31 (×3): qty 2

## 2024-03-31 MED ORDER — INSULIN GLARGINE-YFGN 100 UNIT/ML ~~LOC~~ SOLN
22.0000 [IU] | Freq: Two times a day (BID) | SUBCUTANEOUS | Status: DC
  Administered 2024-03-31 – 2024-04-04 (×8): 22 [IU] via SUBCUTANEOUS
  Filled 2024-03-31 (×9): qty 0.22

## 2024-03-31 MED ORDER — CLONAZEPAM 0.5 MG PO TABS
0.5000 mg | ORAL_TABLET | Freq: Every day | ORAL | Status: DC
  Administered 2024-04-01 – 2024-04-04 (×4): 0.5 mg via ORAL
  Filled 2024-03-31 (×4): qty 1

## 2024-03-31 MED ORDER — BUPROPION HCL ER (XL) 150 MG PO TB24
150.0000 mg | ORAL_TABLET | Freq: Every day | ORAL | Status: DC
  Administered 2024-04-01 – 2024-04-04 (×4): 150 mg via ORAL
  Filled 2024-03-31 (×5): qty 1

## 2024-03-31 NOTE — Plan of Care (Signed)
  Problem: Education: Goal: Utilization of techniques to improve thought processes will improve Outcome: Progressing Goal: Knowledge of the prescribed therapeutic regimen will improve Outcome: Progressing   Problem: Coping: Goal: Coping ability will improve Outcome: Progressing Goal: Will verbalize feelings Outcome: Progressing   

## 2024-03-31 NOTE — Group Note (Signed)
 Date:  03/31/2024 Time:  10:47 AM  Group Topic/Focus:  Goals/Gratitude Group:   The focus of this group is to help patients establish daily goals to achieve during treatment and discuss how the patient can incorporate goal setting into their daily lives to aide in recovery. Also patients watched a video on gratitude and shared their thoughts on the video.    Participation Level:  Active  Participation Quality:  Appropriate  Affect:  Appropriate  Cognitive:  Appropriate  Insight: Appropriate  Engagement in Group:  Engaged  Modes of Intervention:  Activity and Discussion  Additional Comments:    Beatris ONEIDA Hasten 03/31/2024, 10:47 AM

## 2024-03-31 NOTE — Group Note (Signed)
 Date:  03/31/2024 Time:  5:31 PM  Group Topic/Focus:  Managing Feelings:   The focus of this group is to identify what feelings patients have difficulty handling and develop a plan to handle them in a healthier way upon discharge.    Participation Level:  Active  Participation Quality:  Attentive  Affect:  Appropriate  Cognitive:  Alert  Insight: Good  Engagement in Group:  Engaged  Modes of Intervention:  Discussion  Additional Comments:    [name] 03/31/2024, 5:31 PM

## 2024-03-31 NOTE — Progress Notes (Signed)
 Progress Note   Patient: [name] FMW:[id number] DOB: [birth year] DOA: 03/26/2024     5 DOS: the patient was seen and examined on 03/31/2024   Brief hospital course: 63 y.o. male with past medical history of type 1 diabetes on insulin  pump, status post laryngectomy and tracheostomy, history of stroke, dyslipidemia, essential hypertension, hypothyroidism, major depression, OCD, who presents to the hospital for treating depression.  Insulin  pump was removed due to concern of suicidal ideation.  Currently glucose running high.   Symptomatically, patient has a depressed mood.  Otherwise no complaint.  11/21.  Sugars very elevated last night and this morning.  Uses insulin  pump at home.  This morning needed to give one-time dose of NovoLog  10 and increase Semglee  insulin  to 20 units twice daily, increased meal coverage to 12 units 3 times daily. 11/22.  Patient states he is having a lot more trouble breathing with his trach.  He is suctioning himself.  He states he is not getting enough food. 11/23.  Patient is getting for some medication for decades.  Complains of some daytime anxiety and concerned about him living alone and cooking at home.  Asked nursing staff to cut one of the paper down pant leg so he can get his prosthesis on.    Assessment and Plan: * Uncontrolled type 1 diabetes mellitus with hyperglycemia, with long-term current use of insulin  (HCC) Increase dose of Semglee  insulin  to 22 units twice daily.  Continue short acting insulin  12 units 3 times daily plus sliding scale.  Patient uses insulin  pump at home.  Tracheostomy in place [facility]) Patient having transmitted wheeze.  Patient breathing little bit better than yesterday.  Chest x-ray negative.  Hold off on antibiotics.  Elevated serum creatinine Creatinine 1.46.  Creatinine on presentation 1.18.  Advised to drink fluid.  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  Currently does not meet criteria for acute kidney  injury.  Hold Hygroton  and Avapro  for now.  Encourage patient to drink.  Check BMP tomorrow  HTN (hypertension) Patient on Norvasc , and Toprol .  Will hold Hygroton  and irbesartan  secondary to elevation in creatinine today.  Acquired hypothyroidism On levothyroxine   History of DVT (deep vein thrombosis) On Eliquis   Obesity (BMI 30-39.9) BMI 33.77  MDD (major depressive disorder), recurrent severe, without psychosis (HCC) Patient on Lexapro , Zyprexa , Wellbutrin , Seroquel  and clonazepam  as per psychiatry.  Hyperlipidemia, unspecified On Crestor         Subjective: Patient complains of some jock itch.  States his breathing is okay.  Advised to drink yesterday.  Complains of some daytime anxiety.  Concerned about him living alone  Physical Exam: Vitals:   03/30/24 0712 03/30/24 0949 03/30/24 1910 03/31/24 0724  BP: (!) 117/39 (!) 112/53 (!) 122/59 129/60  Pulse: (!) 56 63 65 (!) 56  Resp: 16  14 19   Temp: (!) 97.5 F (36.4 C)  (!) 97.3 F (36.3 C) 97.6 F (36.4 C)  TempSrc:      SpO2: 93%  98% 99%  Weight:      Height:       Physical Exam HENT:     Head: Normocephalic.  Eyes:     General: Lids are normal.  Cardiovascular:     Rate and Rhythm: Normal rate and regular rhythm.     Heart sounds: Normal heart sounds, S1 normal and S2 normal.  Pulmonary:     Breath sounds: No decreased breath sounds, wheezing, rhonchi or rales.     Comments: Upper airway  transmitted sounds Abdominal:     Palpations: Abdomen is soft.     Tenderness: There is no abdominal tenderness.  Musculoskeletal:     Right lower leg: No swelling.     Left Lower Extremity: Left leg is amputated below knee.  Skin:    General: Skin is warm.     Comments: Slight redness right antecubital Patient refused me looking in his groin today.  Neurological:     Mental Status: He is alert and oriented to person, place, and time.     Data Reviewed: Will check labs tomorrow  Family Communication: As per  psychiatry  Disposition: As per psychiatry  Planned Discharge Destination: Home with Home Health    Time spent: 28 minutes  Author: Charlie Patterson, MD 03/31/2024 3:59 PM  For on call review [email or web address]

## 2024-03-31 NOTE — Progress Notes (Signed)
   03/31/24 2000  Psych Admission Type (Psych Patients Only)  Admission Status Voluntary  Psychosocial Assessment  Patient Complaints Anxiety  Eye Contact Fair  Facial Expression Sad  Affect Sad  Speech Logical/coherent  Interaction Assertive  Motor Activity Slow  Appearance/Hygiene Unremarkable  Behavior Characteristics Cooperative;Appropriate to situation  Mood Pleasant  Thought Process  Coherency WDL  Content WDL  Delusions None reported or observed  Perception WDL  Hallucination None reported or observed  Judgment Impaired  Confusion None  Danger to Self  Current suicidal ideation? Denies  Self-Injurious Behavior No self-injurious ideation or behavior indicators observed or expressed   Agreement Not to Harm Self Yes  Description of Agreement verbal  Danger to Others  Danger to Others None reported or observed

## 2024-03-31 NOTE — Plan of Care (Signed)
°  Problem: Coping: °Goal: Coping ability will improve °Outcome: Progressing °  °Problem: Coping: °Goal: Coping ability will improve °Outcome: Progressing °  °

## 2024-03-31 NOTE — Progress Notes (Signed)
 [facility] MD Progress Note  03/31/2024 9:56 AM [name]  MRN:  [id number] [name] is a 63 y.o. male admitted: Presented to the ED for 03/25/2024  5:37 PM for for worsening depression and active suicidal ideation in the setting of functional decline and multiple failed attempts to be evaluated during recent ED visits. He carries the psychiatric diagnoses of Major Depressive Disorder, recurrent severe, and has a past medical history of diabetes mellitus, bilateral below-knee amputations, tracheostomy, and difficulty adjusting to chronic medical disability. Patient is admitted to [facility] unit with Q15 min safety monitoring. Multidisciplinary team approach is offered. Medication management; group/milieu therapy is offered.  Subjective:  Chart reviewed, case discussed in multidisciplinary meeting, patient seen during rounds.   Patient continues to report that he is having challenges with anxiety but overall has been doing fairly well.   Patient reports that his anxiety is worse in the night and overall reports that he is feeling better.  Today denies any suicidal homicidal thoughts.  Patient talks about his multiple medical problems being a major trigger for his depression and anxiety.  He talks about since he got his above-knee amputation last year his life has become physically limited.  He identifies his mom being the main support.  He denies SI/HI/plan today.  He denies auditory/visual hallucinations.  Per nursing report patient is more sleepy in the mornings but is able to come out and engage in groups in the evening.  Provided discussed about maximizing the dosage of his current medications both Wellbutrin  and Lexapro .  He reports that these are the only 2 medications that has been tried also for.  Provider discussed considering a mood stabilizer if he does not see any improvement after optimization of dosage.  Past Psychiatric History: see h&P Family History:  Family History  Problem Relation  Age of Onset   Osteoporosis Mother    Diabetes Mother    Hypertension Father    Mental illness Neg Hx    Social History:  Social History   Substance and Sexual Activity  Alcohol Use Yes   Alcohol/week: 2.0 standard drinks of alcohol   Types: 2 Shots of liquor per week     Social History   Substance and Sexual Activity  Drug Use No   Comment: Pt denied; UDS not available    Social History   Socioeconomic History   Marital status: Widowed    Spouse name: [name]   Number of children: Not on file   Years of education: Not on file   Highest education level: Not on file  Occupational History   Not on file  Tobacco Use   Smoking status: Former    Current packs/day: 0.00    Types: Cigarettes    Quit date: 04/09/2013    Years since quitting: 10.9   Smokeless tobacco: Never  Vaping Use   Vaping status: Never Used  Substance and Sexual Activity   Alcohol use: Yes    Alcohol/week: 2.0 standard drinks of alcohol    Types: 2 Shots of liquor per week   Drug use: No    Comment: Pt denied; UDS not available   Sexual activity: Yes    Partners: Female    Birth control/protection: Condom  Other Topics Concern   Not on file  Social History Narrative   Not on file   Social Drivers of Health   Financial Resource Strain: Low Risk  (12/19/2023)   Received from [facility]   Overall Financial  Resource Strain (CARDIA)    Difficulty of Paying Living Expenses: Not hard at all  Food Insecurity: No Food Insecurity (03/26/2024)   Hunger Vital Sign    Worried About Running Out of Food in the Last Year: Never true    Ran Out of Food in the Last Year: Never true  Transportation Needs: No Transportation Needs (03/26/2024)   PRAPARE - Administrator, Civil Service (Medical): No    Lack of Transportation (Non-Medical): No  Physical Activity: Sufficiently Active (07/12/2023)   Exercise Vital Sign    Days of Exercise per Week: 7 days    Minutes of Exercise per  Session: 30 min  Stress: Stress Concern Present (07/12/2023)   [employer] of Occupational Health - Occupational Stress Questionnaire    Feeling of Stress : Very much  Social Connections: Socially Isolated (07/12/2023)   Social Connection and Isolation Panel    Frequency of Communication with Friends and Family: More than three times a week    Frequency of Social Gatherings with Friends and Family: Never    Attends Religious Services: Never    Database Administrator or Organizations: No    Attends Banker Meetings: Never    Marital Status: Widowed   Past Medical History:  Past Medical History:  Diagnosis Date   Depression    Diabetes (HCC)    Insulin  Pump   Diabetes mellitus type I (HCC)    Diabetes mellitus without complication (HCC)    GERD (gastroesophageal reflux disease)    H/O laryngectomy    Heel bone fracture    History of embolic stroke    Hyperlipidemia    Hypertension    Radicular pain of right lower extremity    Stroke (HCC)    Suicide attempt (HCC) 2014   damaged larynx - tracheostomy   Thyroid  disease     Past Surgical History:  Procedure Laterality Date   COLONOSCOPY WITH PROPOFOL  N/A 05/15/2018   Procedure: COLONOSCOPY WITH PROPOFOL ;  Surgeon: [name], Ladell POUR, MD;  Location: [facility] ENDOSCOPY;  Service: Gastroenterology;  Laterality: N/A;   ESOPHAGOGASTRODUODENOSCOPY N/A 05/15/2018   Procedure: ESOPHAGOGASTRODUODENOSCOPY (EGD);  Surgeon: [name], Ladell POUR, MD;  Location: [facility] ENDOSCOPY;  Service: Gastroenterology;  Laterality: N/A;   FRACTURE SURGERY     Heel bone reconstruction Left    HERNIA REPAIR  02/2011   Umbilical hernia repair    LARYNGECTOMY     LOWER EXTREMITY ANGIOGRAPHY Left 01/31/2021   Procedure: LOWER EXTREMITY ANGIOGRAPHY;  Surgeon: Marea Selinda RAMAN, MD;  Location: [facility] INVASIVE CV LAB;  Service: Cardiovascular;  Laterality: Left;   LOWER EXTREMITY ANGIOGRAPHY Right 02/14/2021   Procedure: LOWER EXTREMITY ANGIOGRAPHY;  Surgeon: Marea Selinda RAMAN, MD;  Location: [facility] INVASIVE CV LAB;  Service: Cardiovascular;  Laterality: Right;   NECK SURGERY     fusion   SPINE SURGERY     TRACHEOSTOMY  2014   from SI attempt    Current Medications: Current Facility-Administered Medications  Medication Dose Route Frequency Provider Last Rate Last Admin   acetaminophen  (TYLENOL ) tablet 650 mg  650 mg Oral Q6H PRN Smith, Annie B, NP   650 mg at 03/26/24 2101   alum & mag hydroxide-simeth (MAALOX/MYLANTA) 200-200-20 MG/5ML suspension 30 mL  30 mL Oral Q4H PRN Smith, Annie B, NP       amLODipine  (NORVASC ) tablet 5 mg  5 mg Oral Daily Smith, Annie B, NP   5 mg at 03/30/24 0940   apixaban  (ELIQUIS ) tablet 5 mg  5 mg Oral BID Smith, Annie B, NP   5 mg at 03/30/24 2114   buPROPion  (WELLBUTRIN  XL) 24 hr tablet 450 mg  450 mg Oral Daily Jadapalle, Sree, MD   450 mg at 03/30/24 0941   clonazePAM  (KLONOPIN ) tablet 1 mg  1 mg Oral Daily PRN Smith, Annie B, NP   1 mg at 03/30/24 2129   diphenhydrAMINE -zinc  acetate (BENADRYL ) 2-0.1 % cream   Topical TID PRN Jadapalle, Sree, MD   Given at 03/30/24 1259   escitalopram  (LEXAPRO ) tablet 15 mg  15 mg Oral Daily Jadapalle, Sree, MD   15 mg at 03/30/24 0940   hydrocortisone  cream 1 %   Topical BID Josette Ade, MD   1 Application at 03/30/24 2134   insulin  aspart (novoLOG ) injection 0-15 Units  0-15 Units Subcutaneous TID WC Smith, Annie B, NP   3 Units at 03/30/24 1718   insulin  aspart (novoLOG ) injection 0-5 Units  0-5 Units Subcutaneous QHS Smith, Annie B, NP   3 Units at 03/30/24 2131   insulin  aspart (novoLOG ) injection 12 Units  12 Units Subcutaneous TID WC Josette Ade, MD   12 Units at 03/31/24 0800   insulin  glargine-yfgn (SEMGLEE ) injection 20 Units  20 Units Subcutaneous BID Jadapalle, Sree, MD   20 Units at 03/30/24 2130   levothyroxine  (SYNTHROID ) tablet 75 mcg  75 mcg Oral Q0600 Smith, Annie B, NP   75 mcg at 03/31/24 9377   magnesium  hydroxide (MILK OF MAGNESIA) suspension 30 mL  30 mL Oral  Daily PRN Smith, Annie B, NP       metoprolol  succinate (TOPROL -XL) 24 hr tablet 25 mg  25 mg Oral Daily Smith, Annie B, NP   25 mg at 03/30/24 9048   OLANZapine  (ZYPREXA ) injection 5 mg  5 mg Intramuscular TID PRN Smith, Annie B, NP       OLANZapine  (ZYPREXA ) tablet 10 mg  10 mg Oral QHS Smith, Annie B, NP   10 mg at 03/30/24 2114   OLANZapine  zydis (ZYPREXA ) disintegrating tablet 5 mg  5 mg Oral TID PRN Smith, Annie B, NP       ondansetron  (ZOFRAN -ODT) disintegrating tablet 4 mg  4 mg Oral Q8H PRN Smith, Annie B, NP       pantoprazole  (PROTONIX ) EC tablet 40 mg  40 mg Oral Daily Smith, Annie B, NP   40 mg at 03/31/24 9193   rosuvastatin  (CRESTOR ) tablet 20 mg  20 mg Oral QHS Smith, Annie B, NP   20 mg at 03/30/24 2115   zolpidem  (AMBIEN ) tablet 5 mg  5 mg Oral QHS PRN Smith, Annie B, NP   5 mg at 03/30/24 2129    Lab Results:  Results for orders placed or performed during the hospital encounter of 03/26/24 (from the past 48 hours)  Glucose, capillary     Status: Abnormal   Collection Time: 03/29/24 11:30 AM  Result Value Ref Range   Glucose-Capillary 317 (H) 70 - 99 mg/dL    Comment: Glucose reference range applies only to samples taken after fasting for at least 8 hours.  Glucose, capillary     Status: Abnormal   Collection Time: 03/29/24  4:07 PM  Result Value Ref Range   Glucose-Capillary 239 (H) 70 - 99 mg/dL    Comment: Glucose reference range applies only to samples taken after fasting for at least 8 hours.  Glucose, capillary     Status: Abnormal   Collection Time: 03/29/24  8:53 PM  Result Value  Ref Range   Glucose-Capillary 246 (H) 70 - 99 mg/dL    Comment: Glucose reference range applies only to samples taken after fasting for at least 8 hours.  Glucose, capillary     Status: Abnormal   Collection Time: 03/30/24  7:38 AM  Result Value Ref Range   Glucose-Capillary 175 (H) 70 - 99 mg/dL    Comment: Glucose reference range applies only to samples taken after fasting for at  least 8 hours.  CBC     Status: None   Collection Time: 03/30/24 10:27 AM  Result Value Ref Range   WBC 5.2 4.0 - 10.5 K/uL   RBC 4.71 4.22 - 5.81 MIL/uL   Hemoglobin 14.1 13.0 - 17.0 g/dL   HCT 58.1 60.9 - 47.9 %   MCV 88.7 80.0 - 100.0 fL   MCH 29.9 26.0 - 34.0 pg   MCHC 33.7 30.0 - 36.0 g/dL   RDW 86.8 88.4 - 84.4 %   Platelets 269 150 - 400 K/uL   nRBC 0.0 0.0 - 0.2 %    Comment: Performed at [facility], [address], [place], [place] [address]  Basic metabolic panel     Status: Abnormal   Collection Time: 03/30/24 10:27 AM  Result Value Ref Range   Sodium 135 135 - 145 mmol/L   Potassium 4.2 3.5 - 5.1 mmol/L   Chloride 99 98 - 111 mmol/L   CO2 26 22 - 32 mmol/L   Glucose, Bld 273 (H) 70 - 99 mg/dL    Comment: Glucose reference range applies only to samples taken after fasting for at least 8 hours.   BUN 42 (H) 8 - 23 mg/dL   Creatinine, Ser 8.53 (H) 0.61 - 1.24 mg/dL   Calcium  9.2 8.9 - 10.3 mg/dL   GFR, Estimated 54 (L) >60 mL/min    Comment: (NOTE) Calculated using the CKD-EPI Creatinine Equation (2021)    Anion gap 9 5 - 15    Comment: Performed at [facility], [address] [place], [place] [address]  Glucose, capillary     Status: Abnormal   Collection Time: 03/30/24 11:37 AM  Result Value Ref Range   Glucose-Capillary 196 (H) 70 - 99 mg/dL    Comment: Glucose reference range applies only to samples taken after fasting for at least 8 hours.  Glucose, capillary     Status: Abnormal   Collection Time: 03/30/24  4:33 PM  Result Value Ref Range   Glucose-Capillary 157 (H) 70 - 99 mg/dL    Comment: Glucose reference range applies only to samples taken after fasting for at least 8 hours.  Glucose, capillary     Status: Abnormal   Collection Time: 03/30/24  7:28 PM  Result Value Ref Range   Glucose-Capillary 209 (H) 70 - 99 mg/dL    Comment: Glucose reference range applies only to samples taken after fasting for at least 8 hours.     Blood Alcohol level:  Lab Results  Component Value Date   [facility] <15 03/25/2024   ETH <10 01/21/2019    Metabolic Disorder Labs: Lab Results  Component Value Date   HGBA1C 7.8 (H) 03/25/2024   MPG 177.16 03/25/2024   MPG 165.68 10/28/2022   No results found for: PROLACTIN Lab Results  Component Value Date   CHOL 161 09/15/2016   TRIG 190 (H) 09/15/2016   HDL 41 09/15/2016   CHOLHDL 3.9 09/15/2016   VLDL 38 09/15/2016   LDLCALC 82 09/15/2016  Physical Findings: AIMS:  , ,  ,  ,    CIWA:    COWS:      Psychiatric Specialty Exam:  Presentation  General Appearance:  Casual  Eye Contact: Fleeting  Speech: Slow  Speech Volume: Decreased    Mood and Affect  Mood: Anxious; Depressed  Affect: Depressed; Flat   Thought Process  Thought Processes: Coherent  Orientation:Full (Time, Place and Person)  Thought Content:Logical  Hallucinations:No data recorded  Ideas of Reference:None  Suicidal Thoughts:No data recorded  Homicidal Thoughts:No data recorded   Sensorium  Memory: Immediate Fair; Remote Fair  Judgment: Impaired  Insight: Shallow   Executive Functions  Concentration: Poor  Attention Span: Poor  Recall: [employer] of Knowledge: Fair  Language: Fair   Psychomotor Activity  Psychomotor Activity: No data recorded  Musculoskeletal: Strength & Muscle Tone: decreased Gait & Station: unsteady Assets  Assets: Manufacturing Systems Engineer; Desire for Improvement    Physical Exam: Physical Exam ROS Blood pressure 129/60, pulse (!) 56, temperature 97.6 F (36.4 C), resp. rate 19, height 6' (1.829 m), weight 112.9 kg, SpO2 99%. Body mass index is 33.77 kg/m.  Diagnosis: Principal Problem:   Uncontrolled type 1 diabetes mellitus with hyperglycemia, with long-term current use of insulin  (HCC) Active Problems:   Acquired hypothyroidism   HTN (hypertension)   Tracheostomy in place (HCC)   Hyperlipidemia,  unspecified   MDD (major depressive disorder), recurrent severe, without psychosis (HCC)   Obesity (BMI 30-39.9)   History of DVT (deep vein thrombosis)   Elevated serum creatinine   Clinical Decision Making: Patient currently admitted with worsening depression and passive suicidal ideation in the context of multiple medical problems.  Patient will be monitored closely for safety  Treatment Plan Summary:  Safety and Monitoring:             -- Voluntary admission to inpatient psychiatric unit for safety, stabilization and treatment             -- Daily contact with patient to assess and evaluate symptoms and progress in treatment             -- Patient's case to be discussed in multi-disciplinary team meeting             -- Observation Level: q15 minute checks             -- Vital signs:  q12 hours             -- Precautions: suicide, elopement, and assault   2. Psychiatric Diagnoses and Treatment:              Wellbutrin  XL 300 mg daily-increase to 450 mg Klonopin  1mg  daily PRN Lexapro  10 mg daily increase it to 15 mg   zaleplon  10 mg qhs   -- The risks/benefits/side-effects/alternatives to this medication were discussed in detail with the patient and time was given for questions. The patient consents to medication trial.                -- Metabolic profile and EKG monitoring obtained while on an atypical antipsychotic (BMI: Lipid Panel: HbgA1c: QTc:)              -- Encouraged patient to participate in unit milieu and in scheduled group therapies  3. Medical Issues Being Addressed:   Hospitalist and diabetic educator consult placed: * Uncontrolled type 1 diabetes mellitus with hyperglycemia, with long-term current use of insulin  (HCC) High sugars last night and again this morning.  1 dose of NovoLog  10 given.  Increased Semglee  insulin  to 20 units twice daily.  Increase meal coverage to 12 units 3 times daily plus sliding scale.   HTN (hypertension) Patient on Norvasc ,  chlorthalidone , irbesartan  and Toprol    Acquired hypothyroidism On levothyroxine    History of DVT (deep vein thrombosis) On Eliquis  4. Discharge Planning:   -- Social work and case management to assist with discharge planning and identification of hospital follow-up needs prior to discharge  -- Estimated LOS: 3-4 days  Millie JONELLE Manners, MD 03/31/2024, 9:56 AM

## 2024-03-31 NOTE — Progress Notes (Signed)
   03/30/24 2300  Psych Admission Type (Psych Patients Only)  Admission Status Voluntary  Psychosocial Assessment  Patient Complaints Anxiety;Depression  Eye Contact Fair  Facial Expression Sad  Affect Sad  Speech Logical/coherent  Interaction Assertive  Motor Activity Slow  Appearance/Hygiene Unremarkable  Behavior Characteristics Cooperative  Mood Pleasant  Thought Process  Coherency WDL  Content WDL  Delusions None reported or observed  Perception WDL  Hallucination None reported or observed  Judgment Impaired  Confusion None  Danger to Self  Current suicidal ideation? Denies  Self-Injurious Behavior No self-injurious ideation or behavior indicators observed or expressed   Agreement Not to Harm Self Yes  Description of Agreement Verbal  Danger to Others  Danger to Others None reported or observed

## 2024-03-31 NOTE — Group Note (Signed)
 Recreation Therapy Group Note   Group Topic:Emotion Expression  Group Date: 03/31/2024 Start Time: 1510 End Time: 1615 Facilitators: Celestia Jeoffrey BRAVO, LRT, CTRS Location: Courtyard  Group Description: Music. Patients are encouraged to name their favorite song(s) for LRT to play song through speaker for group to hear, while in the courtyard getting fresh air and sunlight. Patients educated on the definition of leisure and the importance of having different leisure interests outside of the hospital. Group discussed how leisure activities can often be used as pharmacologist and that listening to music and being outside are examples.    Goal Area(s) Addressed:  Patient will identify a current leisure interest.  Patient will practice making a positive decision. Patient will have the opportunity to try a new leisure activity.   Affect/Mood: Appropriate   Participation Level: Moderate    Clinical Observations/Individualized Feedback: [place] came late to group. Pt was present for less than half. Pt shared that he liked music by [employer].   Plan: Continue to engage patient in RT group sessions 2-3x/week.   Jeoffrey BRAVO Celestia, LRT, CTRS 03/31/2024 5:17 PM

## 2024-03-31 NOTE — Progress Notes (Signed)
 [facility] MD Progress Note  03/31/2024 1:25 PM KDEN WAGSTER  MRN:  [id number] KEROLOS NEHME is a 63 y.o. male admitted: Presented to the ED for 03/25/2024  5:37 PM for for worsening depression and active suicidal ideation in the setting of functional decline and multiple failed attempts to be evaluated during recent ED visits. He carries the psychiatric diagnoses of Major Depressive Disorder, recurrent severe, and has a past medical history of diabetes mellitus, bilateral below-knee amputations, tracheostomy, and difficulty adjusting to chronic medical disability. Patient is admitted to [facility] unit with Q15 min safety monitoring. Multidisciplinary team approach is offered. Medication management; group/milieu therapy is offered.  Subjective:  Chart reviewed, case discussed in multidisciplinary meeting, patient seen during rounds.  Patient is noted to be resting in bed.  He Reports ongoing depression to and passive SI with no intent or plan.  He reports that the medications are not helping him.  Provider discussed the treatment plan of reducing the Wellbutrin , increasing Klonopin  to twice daily dosing.  Given his poorly controlled diabetes Zyprexa  is not a good option.  Discontinue Zyprexa  and Seroquel  is added to help with sleep and depression and anxiety  Past Psychiatric History: see h&P Family History:  Family History  Problem Relation Age of Onset   Osteoporosis Mother    Diabetes Mother    Hypertension Father    Mental illness Neg Hx    Social History:  Social History   Substance and Sexual Activity  Alcohol Use Yes   Alcohol/week: 2.0 standard drinks of alcohol   Types: 2 Shots of liquor per week     Social History   Substance and Sexual Activity  Drug Use No   Comment: Pt denied; UDS not available    Social History   Socioeconomic History   Marital status: Widowed    Spouse name: [name]   Number of children: Not on file   Years of education: Not on file   Highest education  level: Not on file  Occupational History   Not on file  Tobacco Use   Smoking status: Former    Current packs/day: 0.00    Types: Cigarettes    Quit date: 04/09/2013    Years since quitting: 10.9   Smokeless tobacco: Never  Vaping Use   Vaping status: Never Used  Substance and Sexual Activity   Alcohol use: Yes    Alcohol/week: 2.0 standard drinks of alcohol    Types: 2 Shots of liquor per week   Drug use: No    Comment: Pt denied; UDS not available   Sexual activity: Yes    Partners: Female    Birth control/protection: Condom  Other Topics Concern   Not on file  Social History Narrative   Not on file   Social Drivers of Health   Financial Resource Strain: Low Risk  (12/19/2023)   Received from [facility]   Overall Financial Resource Strain (CARDIA)    Difficulty of Paying Living Expenses: Not hard at all  Food Insecurity: No Food Insecurity (03/26/2024)   Hunger Vital Sign    Worried About Running Out of Food in the Last Year: Never true    Ran Out of Food in the Last Year: Never true  Transportation Needs: No Transportation Needs (03/26/2024)   PRAPARE - Administrator, Civil Service (Medical): No    Lack of Transportation (Non-Medical): No  Physical Activity: Sufficiently Active (07/12/2023)   Exercise Vital Sign    Days  of Exercise per Week: 7 days    Minutes of Exercise per Session: 30 min  Stress: Stress Concern Present (07/12/2023)   [employer] of Occupational Health - Occupational Stress Questionnaire    Feeling of Stress : Very much  Social Connections: Socially Isolated (07/12/2023)   Social Connection and Isolation Panel    Frequency of Communication with Friends and Family: More than three times a week    Frequency of Social Gatherings with Friends and Family: Never    Attends Religious Services: Never    Database Administrator or Organizations: No    Attends Banker Meetings: Never    Marital Status: Widowed    Past Medical History:  Past Medical History:  Diagnosis Date   Depression    Diabetes (HCC)    Insulin  Pump   Diabetes mellitus type I (HCC)    Diabetes mellitus without complication (HCC)    GERD (gastroesophageal reflux disease)    H/O laryngectomy    Heel bone fracture    History of embolic stroke    Hyperlipidemia    Hypertension    Radicular pain of right lower extremity    Stroke (HCC)    Suicide attempt (HCC) 2014   damaged larynx - tracheostomy   Thyroid  disease     Past Surgical History:  Procedure Laterality Date   COLONOSCOPY WITH PROPOFOL  N/A 05/15/2018   Procedure: COLONOSCOPY WITH PROPOFOL ;  Surgeon: [name], Ladell POUR, MD;  Location: [facility] ENDOSCOPY;  Service: Gastroenterology;  Laterality: N/A;   ESOPHAGOGASTRODUODENOSCOPY N/A 05/15/2018   Procedure: ESOPHAGOGASTRODUODENOSCOPY (EGD);  Surgeon: [name], Ladell POUR, MD;  Location: [facility] ENDOSCOPY;  Service: Gastroenterology;  Laterality: N/A;   FRACTURE SURGERY     Heel bone reconstruction Left    HERNIA REPAIR  02/2011   Umbilical hernia repair    LARYNGECTOMY     LOWER EXTREMITY ANGIOGRAPHY Left 01/31/2021   Procedure: LOWER EXTREMITY ANGIOGRAPHY;  Surgeon: Marea Selinda RAMAN, MD;  Location: [facility] INVASIVE CV LAB;  Service: Cardiovascular;  Laterality: Left;   LOWER EXTREMITY ANGIOGRAPHY Right 02/14/2021   Procedure: LOWER EXTREMITY ANGIOGRAPHY;  Surgeon: Marea Selinda RAMAN, MD;  Location: [facility] INVASIVE CV LAB;  Service: Cardiovascular;  Laterality: Right;   NECK SURGERY     fusion   SPINE SURGERY     TRACHEOSTOMY  2014   from SI attempt    Current Medications: Current Facility-Administered Medications  Medication Dose Route Frequency Provider Last Rate Last Admin   acetaminophen  (TYLENOL ) tablet 650 mg  650 mg Oral Q6H PRN Smith, Annie B, NP   650 mg at 03/26/24 2101   alum & mag hydroxide-simeth (MAALOX/MYLANTA) 200-200-20 MG/5ML suspension 30 mL  30 mL Oral Q4H PRN Smith, Annie B, NP       amLODipine  (NORVASC ) tablet 5  mg  5 mg Oral Daily Smith, Annie B, NP   5 mg at 03/31/24 1027   apixaban  (ELIQUIS ) tablet 5 mg  5 mg Oral BID Smith, Annie B, NP   5 mg at 03/31/24 1027   [START ON 04/01/2024] buPROPion  (WELLBUTRIN  XL) 24 hr tablet 150 mg  150 mg Oral Daily Ericia Moxley, MD       NOREEN ON 04/01/2024] clonazePAM  (KLONOPIN ) tablet 0.5 mg  0.5 mg Oral Q breakfast Matheson Vandehei, MD       clonazePAM  (KLONOPIN ) tablet 1 mg  1 mg Oral Daily PRN Smith, Annie B, NP   1 mg at 03/30/24 2129   diphenhydrAMINE -zinc  acetate (BENADRYL ) 2-0.1 % cream  Topical TID PRN Tayla Panozzo, MD   Given at 03/30/24 1259   escitalopram  (LEXAPRO ) tablet 15 mg  15 mg Oral Daily Notnamed Croucher, MD   15 mg at 03/31/24 1027   hydrocortisone  cream 1 %   Topical BID Josette Ade, MD   Given at 03/31/24 1030   insulin  aspart (novoLOG ) injection 0-15 Units  0-15 Units Subcutaneous TID WC Smith, Annie B, NP   3 Units at 03/31/24 1135   insulin  aspart (novoLOG ) injection 0-5 Units  0-5 Units Subcutaneous QHS Smith, Annie B, NP   3 Units at 03/30/24 2131   insulin  aspart (novoLOG ) injection 12 Units  12 Units Subcutaneous TID WC Josette Ade, MD   12 Units at 03/31/24 1133   insulin  glargine-yfgn (SEMGLEE ) injection 20 Units  20 Units Subcutaneous BID Eren Puebla, MD   20 Units at 03/31/24 1029   levothyroxine  (SYNTHROID ) tablet 75 mcg  75 mcg Oral Q0600 Smith, Annie B, NP   75 mcg at 03/31/24 9377   magnesium  hydroxide (MILK OF MAGNESIA) suspension 30 mL  30 mL Oral Daily PRN Smith, Annie B, NP       metoprolol  succinate (TOPROL -XL) 24 hr tablet 25 mg  25 mg Oral Daily Smith, Annie B, NP   25 mg at 03/31/24 1027   nystatin  cream (MYCOSTATIN )   Topical BID Josette Ade, MD       OLANZapine  (ZYPREXA ) injection 5 mg  5 mg Intramuscular TID PRN Smith, Annie B, NP       OLANZapine  (ZYPREXA ) tablet 10 mg  10 mg Oral QHS Smith, Annie B, NP   10 mg at 03/30/24 2114   OLANZapine  zydis (ZYPREXA ) disintegrating tablet 5 mg  5 mg Oral  TID PRN Smith, Annie B, NP       ondansetron  (ZOFRAN -ODT) disintegrating tablet 4 mg  4 mg Oral Q8H PRN Smith, Annie B, NP       pantoprazole  (PROTONIX ) EC tablet 40 mg  40 mg Oral Daily Smith, Annie B, NP   40 mg at 03/31/24 9193   rosuvastatin  (CRESTOR ) tablet 20 mg  20 mg Oral QHS Smith, Annie B, NP   20 mg at 03/30/24 2115   zolpidem  (AMBIEN ) tablet 5 mg  5 mg Oral QHS PRN Smith, Annie B, NP   5 mg at 03/30/24 2129    Lab Results:  Results for orders placed or performed during the hospital encounter of 03/26/24 (from the past 48 hours)  Glucose, capillary     Status: Abnormal   Collection Time: 03/29/24  4:07 PM  Result Value Ref Range   Glucose-Capillary 239 (H) 70 - 99 mg/dL    Comment: Glucose reference range applies only to samples taken after fasting for at least 8 hours.  Glucose, capillary     Status: Abnormal   Collection Time: 03/29/24  8:53 PM  Result Value Ref Range   Glucose-Capillary 246 (H) 70 - 99 mg/dL    Comment: Glucose reference range applies only to samples taken after fasting for at least 8 hours.  Glucose, capillary     Status: Abnormal   Collection Time: 03/30/24  7:38 AM  Result Value Ref Range   Glucose-Capillary 175 (H) 70 - 99 mg/dL    Comment: Glucose reference range applies only to samples taken after fasting for at least 8 hours.  CBC     Status: None   Collection Time: 03/30/24 10:27 AM  Result Value Ref Range   WBC 5.2 4.0 - 10.5 K/uL  RBC 4.71 4.22 - 5.81 MIL/uL   Hemoglobin 14.1 13.0 - 17.0 g/dL   HCT 58.1 60.9 - 47.9 %   MCV 88.7 80.0 - 100.0 fL   MCH 29.9 26.0 - 34.0 pg   MCHC 33.7 30.0 - 36.0 g/dL   RDW 86.8 88.4 - 84.4 %   Platelets 269 150 - 400 K/uL   nRBC 0.0 0.0 - 0.2 %    Comment: Performed at [facility], [address] [place], [place] [address]  Basic metabolic panel     Status: Abnormal   Collection Time: 03/30/24 10:27 AM  Result Value Ref Range   Sodium 135 135 - 145 mmol/L   Potassium 4.2 3.5 - 5.1 mmol/L    Chloride 99 98 - 111 mmol/L   CO2 26 22 - 32 mmol/L   Glucose, Bld 273 (H) 70 - 99 mg/dL    Comment: Glucose reference range applies only to samples taken after fasting for at least 8 hours.   BUN 42 (H) 8 - 23 mg/dL   Creatinine, Ser 8.53 (H) 0.61 - 1.24 mg/dL   Calcium  9.2 8.9 - 10.3 mg/dL   GFR, Estimated 54 (L) >60 mL/min    Comment: (NOTE) Calculated using the CKD-EPI Creatinine Equation (2021)    Anion gap 9 5 - 15    Comment: Performed at [facility], [address] [place], [place] [address]  Glucose, capillary     Status: Abnormal   Collection Time: 03/30/24 11:37 AM  Result Value Ref Range   Glucose-Capillary 196 (H) 70 - 99 mg/dL    Comment: Glucose reference range applies only to samples taken after fasting for at least 8 hours.  Glucose, capillary     Status: Abnormal   Collection Time: 03/30/24  4:33 PM  Result Value Ref Range   Glucose-Capillary 157 (H) 70 - 99 mg/dL    Comment: Glucose reference range applies only to samples taken after fasting for at least 8 hours.  Glucose, capillary     Status: Abnormal   Collection Time: 03/30/24  7:28 PM  Result Value Ref Range   Glucose-Capillary 209 (H) 70 - 99 mg/dL    Comment: Glucose reference range applies only to samples taken after fasting for at least 8 hours.  Glucose, capillary     Status: Abnormal   Collection Time: 03/31/24 11:27 AM  Result Value Ref Range   Glucose-Capillary 260 (H) 70 - 99 mg/dL    Comment: Glucose reference range applies only to samples taken after fasting for at least 8 hours.    Blood Alcohol level:  Lab Results  Component Value Date   [facility] <15 03/25/2024   ETH <10 01/21/2019    Metabolic Disorder Labs: Lab Results  Component Value Date   HGBA1C 7.8 (H) 03/25/2024   MPG 177.16 03/25/2024   MPG 165.68 10/28/2022   No results found for: PROLACTIN Lab Results  Component Value Date   CHOL 161 09/15/2016   TRIG 190 (H) 09/15/2016   HDL 41 09/15/2016   CHOLHDL 3.9  09/15/2016   VLDL 38 09/15/2016   LDLCALC 82 09/15/2016    Physical Findings: AIMS:  , ,  ,  ,    CIWA:    COWS:      Psychiatric Specialty Exam:  Presentation  General Appearance:  Casual  Eye Contact: Fleeting  Speech: Slow  Speech Volume: Decreased    Mood and Affect  Mood: Anxious; Depressed  Affect: Depressed; Flat   Thought  Process  Thought Processes: Coherent  Orientation:Full (Time, Place and Person)  Thought Content:Logical  Hallucinations:denies  Ideas of Reference:None  Suicidal Thoughts:passive Si with no intent/plan  Homicidal Thoughts:denies   Sensorium  Memory: Immediate Fair; Remote Fair  Judgment: Impaired  Insight: Shallow   Executive Functions  Concentration: Poor  Attention Span: Poor  Recall: [employer] of Knowledge: Fair  Language: Fair   Psychomotor Activity  Psychomotor Activity: No data recorded  Musculoskeletal: Strength & Muscle Tone: decreased Gait & Station: unsteady Assets  Assets: Manufacturing Systems Engineer; Desire for Improvement    Physical Exam: Physical Exam ROS Blood pressure 129/60, pulse (!) 56, temperature 97.6 F (36.4 C), resp. rate 19, height 6' (1.829 m), weight 112.9 kg, SpO2 99%. Body mass index is 33.77 kg/m.  Diagnosis: Principal Problem:   Uncontrolled type 1 diabetes mellitus with hyperglycemia, with long-term current use of insulin  (HCC) Active Problems:   Acquired hypothyroidism   HTN (hypertension)   Tracheostomy in place (HCC)   Hyperlipidemia, unspecified   MDD (major depressive disorder), recurrent severe, without psychosis (HCC)   Obesity (BMI 30-39.9)   History of DVT (deep vein thrombosis)   Elevated serum creatinine   Clinical Decision Making: Patient currently admitted with worsening depression and passive suicidal ideation in the context of multiple medical problems.  Patient will be monitored closely for safety  Treatment Plan Summary:  Safety  and Monitoring:             -- Voluntary admission to inpatient psychiatric unit for safety, stabilization and treatment             -- Daily contact with patient to assess and evaluate symptoms and progress in treatment             -- Patient's case to be discussed in multi-disciplinary team meeting             -- Observation Level: q15 minute checks             -- Vital signs:  q12 hours             -- Precautions: suicide, elopement, and assault   2. Psychiatric Diagnoses and Treatment:             Worsening anxiety- decrease  Wellbutrin  XL 150 mg Increase Klonopin   0.5 mg qam and 1mg  daily PRN Lexapro   15 mg  Patient has poorly controlled diabetes- discontinue Zyprexa  add Seroquel  50 mg at bedtime to help with anxiety/insomnia   -- The risks/benefits/side-effects/alternatives to this medication were discussed in detail with the patient and time was given for questions. The patient consents to medication trial.                -- Metabolic profile and EKG monitoring obtained while on an atypical antipsychotic (BMI: Lipid Panel: HbgA1c: QTc:)              -- Encouraged patient to participate in unit milieu and in scheduled group therapies  3. Medical Issues Being Addressed:   Hospitalist and diabetic educator consult placed: * Uncontrolled type 1 diabetes mellitus with hyperglycemia, with long-term current use of insulin  (HCC) High sugars last night and again this morning.  1 dose of NovoLog  10 given.  Increased Semglee  insulin  to 20 units twice daily.  Increase meal coverage to 12 units 3 times daily plus sliding scale.   HTN (hypertension) Patient on Norvasc , chlorthalidone , irbesartan  and Toprol    Acquired hypothyroidism On levothyroxine    History of DVT (deep  vein thrombosis) On Eliquis  4. Discharge Planning:   -- Social work and case management to assist with discharge planning and identification of hospital follow-up needs prior to discharge  -- Estimated LOS: 3-4  days  Allyn Foil, MD 03/31/2024, 1:25 PM

## 2024-03-31 NOTE — [facility] Suicide Risk Assessment (Signed)
 [facility] INPATIENT:  Family/Significant Other Suicide Prevention Education  Suicide Prevention Education:  Education Completed; Cataldo Cosgriff, mother, [phone],  (name of family member/significant other) has been identified by the patient as the family member/significant other with whom the patient will be residing, and identified as the person(s) who will aid the patient in the event of a mental health crisis (suicidal ideations/suicide attempt).  With written consent from the patient, the family member/significant other has been provided the following suicide prevention education, prior to the and/or following the discharge of the patient.  The suicide prevention education provided includes the following: Suicide risk factors Suicide prevention and interventions National Suicide Hotline telephone number [facility] assessment telephone number [facility] Emergency Assistance [address] [facility] and/or Residential Mobile Crisis Unit telephone number  Request made of family/significant other to: Remove weapons (e.g., guns, rifles, knives), all items previously/currently identified as safety concern.   Remove drugs/medications (over-the-counter, prescriptions, illicit drugs), all items previously/currently identified as a safety concern.  The family member/significant other verbalizes understanding of the suicide prevention education information provided.  The family member/significant other agrees to remove the items of safety concern listed above.  Doris reports that pt came to the hospital because of depression. Reports pt has been depressed for over a month and she asked pt if he would ever harm himself and Donia reports that pt stated, Well I thought about it, but I don't have the nerve to do it   Reports pt has abused medications in the past, but not in a long time.   Reports pt lives a lone and that his wife died years ago and she reports she is concerned about pt living a  lone, that pt would benefit from a nursing home.  Reports pt has 4 children that he is not close with.   Doris reports she feels pt needs a child psychotherapist to follow him in the community. CSW explained that currently there are no social workers who follow pt's in the community without APS involvement or a psychosis diagnosis.    Lum JONETTA Croft 03/31/2024, 9:45 AM

## 2024-03-31 NOTE — Group Note (Signed)
 Date:  03/31/2024 Time:  10:36 PM  Group Topic/Focus:  Wrap-Up Group:   The focus of this group is to help patients review their daily goal of treatment and discuss progress on daily workbooks.    Participation Level:  Active  Participation Quality:  Appropriate  Affect:  Appropriate  Cognitive:  Alert  Insight: Appropriate  Engagement in Group:  Engaged  Modes of Intervention:  Discussion  Additional Comments:    Tommas [name] 03/31/2024, 10:36 PM

## 2024-04-01 LAB — GLUCOSE, CAPILLARY
Glucose-Capillary: 359 mg/dL — ABNORMAL HIGH (ref 70–99)
Glucose-Capillary: 164 mg/dL — ABNORMAL HIGH (ref 70–99)
Glucose-Capillary: 190 mg/dL — ABNORMAL HIGH (ref 70–99)
Glucose-Capillary: 128 mg/dL — ABNORMAL HIGH (ref 70–99)

## 2024-04-01 NOTE — Group Note (Signed)
 Date:  04/01/2024 Time:  9:14 PM  Group Topic/Focus:  Wrap-Up Group:   The focus of this group is to help patients review their daily goal of treatment and discuss progress on daily workbooks.    Participation Level:  Active  Participation Quality:  Appropriate  Affect:  Appropriate  Cognitive:  Alert  Insight: Appropriate  Engagement in Group:  Engaged  Modes of Intervention:  Discussion  Additional Comments:    [name] [name] 04/01/2024, 9:14 PM

## 2024-04-01 NOTE — Group Note (Signed)
 Recreation Therapy Group Note   Group Topic:Stress Management  Group Date: 04/01/2024 Start Time: 1100 End Time: 1135 Facilitators: Celestia Jeoffrey BRAVO, LRT, CTRS Location: Dayroom  Group Description: PMR (Progressive Muscle Relaxation). LRT educates patients on what PMR is and the benefits that come from it. Patients are asked to sit with their feet flat on the floor while sitting up and all the way back in their chair, if possible. LRT and pts follow a prompt through a speaker that requires you to tense and release different muscles in their body and focus on their breathing. During session, lights are off and soft music is being played. Pts are given a stress ball to use if needed.   Goal Area(s) Addressed:  Patients will be able to describe progressive muscle relaxation.  Patient will practice using relaxation technique. Patient will identify a new coping skill.  Patient will follow multistep directions to reduce anxiety and stress.   Affect/Mood: N/A   Participation Level: Did not attend    Clinical Observations/Individualized Feedback: Patient did not attend group.   Plan: Continue to engage patient in RT group sessions 2-3x/week.   Jeoffrey BRAVO Celestia, LRT, CTRS 04/01/2024 1:38 PM

## 2024-04-01 NOTE — Progress Notes (Signed)
   04/01/24 1400  Psych Admission Type (Psych Patients Only)  Admission Status Voluntary  Psychosocial Assessment  Patient Complaints Anxiety  Eye Contact Fair  Facial Expression Sad  Affect Sad  Speech Logical/coherent  Interaction Assertive  Motor Activity Slow  Appearance/Hygiene Unremarkable  Behavior Characteristics Cooperative  Mood Pleasant  Thought Process  Coherency WDL  Content WDL  Delusions None reported or observed  Perception WDL  Hallucination None reported or observed  Judgment Impaired  Confusion None  Danger to Self  Current suicidal ideation? Denies  Agreement Not to Harm Self Yes  Description of Agreement verbal  Danger to Others  Danger to Others None reported or observed

## 2024-04-01 NOTE — Group Note (Signed)
 LCSW Group Therapy Note  Group Date: 04/01/2024 Start Time: 1300 End Time: 1340   Type of Therapy and Topic:  Group Therapy - Healthy vs Unhealthy Coping Skills  Participation Level:  Minimal   Description of Group The focus of this group was to determine what unhealthy coping techniques typically are used by group members and what healthy coping techniques would be helpful in coping with various problems. Patients were guided in becoming aware of the differences between healthy and unhealthy coping techniques. Patients were asked to identify 2-3 healthy coping skills they would like to learn to use more effectively.  Therapeutic Goals Patients learned that coping is what human beings do all day long to deal with various situations in their lives Patients defined and discussed healthy vs unhealthy coping techniques Patients identified their preferred coping techniques and identified whether these were healthy or unhealthy Patients determined 2-3 healthy coping skills they would like to become more familiar with and use more often. Patients provided support and ideas to each other   Summary of Patient Progress:  During group, Mukesh expressed difficulty with identifying coping skills . Patient proved open to input from peers and feedback from CSW. Patient demonstrated fair insight into the subject matter, was respectful of peers, and participated throughout the entire session.   Therapeutic Modalities Cognitive Behavioral Therapy Motivational Interviewing  Lum JONETTA Croft, LCSWA 04/01/2024  2:20 PM

## 2024-04-01 NOTE — Progress Notes (Signed)
   04/01/24 2300  Psych Admission Type (Psych Patients Only)  Admission Status Voluntary  Psychosocial Assessment  Patient Complaints Anxiety  Eye Contact Fair  Facial Expression Sad  Affect Sad  Speech Logical/coherent  Interaction Assertive  Motor Activity Slow  Appearance/Hygiene Unremarkable  Behavior Characteristics Cooperative  Mood Sad;Pleasant  Thought Process  Coherency WDL  Content WDL  Delusions None reported or observed  Perception WDL  Hallucination None reported or observed  Judgment Impaired  Confusion None  Danger to Self  Current suicidal ideation? Denies  Self-Injurious Behavior No self-injurious ideation or behavior indicators observed or expressed   Agreement Not to Harm Self Yes  Description of Agreement Verbal  Danger to Others  Danger to Others None reported or observed

## 2024-04-01 NOTE — Progress Notes (Signed)
 [facility] MD Progress Note  04/01/2024 12:06 PM DONEL OSOWSKI  MRN:  [id number] RASMUS PREUSSER is a 63 y.o. male admitted: Presented to the ED for 03/25/2024  5:37 PM for for worsening depression and active suicidal ideation in the setting of functional decline and multiple failed attempts to be evaluated during recent ED visits. He carries the psychiatric diagnoses of Major Depressive Disorder, recurrent severe, and has a past medical history of diabetes mellitus, bilateral below-knee amputations, tracheostomy, and difficulty adjusting to chronic medical disability. Patient is admitted to [facility] unit with Q15 min safety monitoring. Multidisciplinary team approach is offered. Medication management; group/milieu therapy is offered.  Subjective:  Chart reviewed, case discussed in multidisciplinary meeting, patient seen during rounds.   Patient is noted to be resting in bed.  He reports that the increased dose of Klonopin  in the mornings is feeling better.  He rates his depression as 2 out of 10, 10 being the worst and anxiety as 8 out of 10, 10 being the worst.  He denies any panic attacks.  He acknowledged the decrease in Wellbutrin  due to worsening anxiety, discontinuation of Zyprexa  due to his poorly controlled diabetes, adding Seroquel  to help with his anxiety and sleep.  He reports ongoing sleep issues.  He denies current SI/HI/plan.  He denies auditory/visual hallucinations. Past Psychiatric History: see h&P Family History:  Family History  Problem Relation Age of Onset   Osteoporosis Mother    Diabetes Mother    Hypertension Father    Mental illness Neg Hx    Social History:  Social History   Substance and Sexual Activity  Alcohol Use Yes   Alcohol/week: 2.0 standard drinks of alcohol   Types: 2 Shots of liquor per week     Social History   Substance and Sexual Activity  Drug Use No   Comment: Pt denied; UDS not available    Social History   Socioeconomic History   Marital  status: Widowed    Spouse name: [name]   Number of children: Not on file   Years of education: Not on file   Highest education level: Not on file  Occupational History   Not on file  Tobacco Use   Smoking status: Former    Current packs/day: 0.00    Types: Cigarettes    Quit date: 04/09/2013    Years since quitting: 10.9   Smokeless tobacco: Never  Vaping Use   Vaping status: Never Used  Substance and Sexual Activity   Alcohol use: Yes    Alcohol/week: 2.0 standard drinks of alcohol    Types: 2 Shots of liquor per week   Drug use: No    Comment: Pt denied; UDS not available   Sexual activity: Yes    Partners: Female    Birth control/protection: Condom  Other Topics Concern   Not on file  Social History Narrative   Not on file   Social Drivers of Health   Financial Resource Strain: Low Risk  (12/19/2023)   Received from [facility]   Overall Financial Resource Strain (CARDIA)    Difficulty of Paying Living Expenses: Not hard at all  Food Insecurity: No Food Insecurity (03/26/2024)   Hunger Vital Sign    Worried About Running Out of Food in the Last Year: Never true    Ran Out of Food in the Last Year: Never true  Transportation Needs: No Transportation Needs (03/26/2024)   PRAPARE - Administrator, Civil Service (Medical):  No    Lack of Transportation (Non-Medical): No  Physical Activity: Sufficiently Active (07/12/2023)   Exercise Vital Sign    Days of Exercise per Week: 7 days    Minutes of Exercise per Session: 30 min  Stress: Stress Concern Present (07/12/2023)   [employer] of Occupational Health - Occupational Stress Questionnaire    Feeling of Stress : Very much  Social Connections: Socially Isolated (07/12/2023)   Social Connection and Isolation Panel    Frequency of Communication with Friends and Family: More than three times a week    Frequency of Social Gatherings with Friends and Family: Never    Attends Religious Services:  Never    Database Administrator or Organizations: No    Attends Banker Meetings: Never    Marital Status: Widowed   Past Medical History:  Past Medical History:  Diagnosis Date   Depression    Diabetes (HCC)    Insulin  Pump   Diabetes mellitus type I (HCC)    Diabetes mellitus without complication (HCC)    GERD (gastroesophageal reflux disease)    H/O laryngectomy    Heel bone fracture    History of embolic stroke    Hyperlipidemia    Hypertension    Radicular pain of right lower extremity    Stroke (HCC)    Suicide attempt (HCC) 2014   damaged larynx - tracheostomy   Thyroid  disease     Past Surgical History:  Procedure Laterality Date   COLONOSCOPY WITH PROPOFOL  N/A 05/15/2018   Procedure: COLONOSCOPY WITH PROPOFOL ;  Surgeon: [name], Ladell POUR, MD;  Location: [facility] ENDOSCOPY;  Service: Gastroenterology;  Laterality: N/A;   ESOPHAGOGASTRODUODENOSCOPY N/A 05/15/2018   Procedure: ESOPHAGOGASTRODUODENOSCOPY (EGD);  Surgeon: [name], Ladell POUR, MD;  Location: [facility] ENDOSCOPY;  Service: Gastroenterology;  Laterality: N/A;   FRACTURE SURGERY     Heel bone reconstruction Left    HERNIA REPAIR  02/2011   Umbilical hernia repair    LARYNGECTOMY     LOWER EXTREMITY ANGIOGRAPHY Left 01/31/2021   Procedure: LOWER EXTREMITY ANGIOGRAPHY;  Surgeon: Marea Selinda RAMAN, MD;  Location: [facility] INVASIVE CV LAB;  Service: Cardiovascular;  Laterality: Left;   LOWER EXTREMITY ANGIOGRAPHY Right 02/14/2021   Procedure: LOWER EXTREMITY ANGIOGRAPHY;  Surgeon: Marea Selinda RAMAN, MD;  Location: [facility] INVASIVE CV LAB;  Service: Cardiovascular;  Laterality: Right;   NECK SURGERY     fusion   SPINE SURGERY     TRACHEOSTOMY  2014   from SI attempt    Current Medications: Current Facility-Administered Medications  Medication Dose Route Frequency Provider Last Rate Last Admin   acetaminophen  (TYLENOL ) tablet 650 mg  650 mg Oral Q6H PRN Smith, Annie B, NP   650 mg at 03/31/24 1626   alum & mag  hydroxide-simeth (MAALOX/MYLANTA) 200-200-20 MG/5ML suspension 30 mL  30 mL Oral Q4H PRN Smith, Annie B, NP       amLODipine  (NORVASC ) tablet 5 mg  5 mg Oral Daily Smith, Annie B, NP   5 mg at 04/01/24 1010   apixaban  (ELIQUIS ) tablet 5 mg  5 mg Oral BID Smith, Annie B, NP   5 mg at 04/01/24 1010   buPROPion  (WELLBUTRIN  XL) 24 hr tablet 150 mg  150 mg Oral Daily Avontae Burkhead, MD   150 mg at 04/01/24 1011   clonazePAM  (KLONOPIN ) tablet 0.5 mg  0.5 mg Oral Q breakfast Chloie Loney, MD   0.5 mg at 04/01/24 1011   clonazePAM  (KLONOPIN ) tablet 1 mg  1 mg  Oral Daily PRN Smith, Annie B, NP   1 mg at 03/31/24 2222   diphenhydrAMINE -zinc  acetate (BENADRYL ) 2-0.1 % cream   Topical TID PRN Tuwana Kapaun, MD   Given at 03/30/24 1259   escitalopram  (LEXAPRO ) tablet 15 mg  15 mg Oral Daily Alyvia Derk, MD   15 mg at 04/01/24 1009   hydrocortisone  cream 1 %   Topical BID Josette Ade, MD   Given at 04/01/24 1015   insulin  aspart (novoLOG ) injection 0-15 Units  0-15 Units Subcutaneous TID WC Smith, Annie B, NP   15 Units at 04/01/24 0854   insulin  aspart (novoLOG ) injection 0-5 Units  0-5 Units Subcutaneous QHS Smith, Annie B, NP   2 Units at 03/31/24 2139   insulin  aspart (novoLOG ) injection 12 Units  12 Units Subcutaneous TID WC Josette Ade, MD   12 Units at 04/01/24 0854   insulin  glargine-yfgn (SEMGLEE ) injection 22 Units  22 Units Subcutaneous BID Josette Ade, MD   22 Units at 04/01/24 1012   levothyroxine  (SYNTHROID ) tablet 75 mcg  75 mcg Oral Q0600 Smith, Annie B, NP   75 mcg at 04/01/24 9381   loperamide  (IMODIUM ) capsule 4 mg  4 mg Oral PRN Kiyaan Haq, MD   4 mg at 03/31/24 1759   magnesium  hydroxide (MILK OF MAGNESIA) suspension 30 mL  30 mL Oral Daily PRN Smith, Annie B, NP       metoprolol  succinate (TOPROL -XL) 24 hr tablet 25 mg  25 mg Oral Daily Smith, Annie B, NP   25 mg at 04/01/24 1011   nystatin  cream (MYCOSTATIN )   Topical BID Josette Ade, MD   Given at  04/01/24 1014   OLANZapine  (ZYPREXA ) injection 5 mg  5 mg Intramuscular TID PRN Smith, Annie B, NP       OLANZapine  zydis (ZYPREXA ) disintegrating tablet 5 mg  5 mg Oral TID PRN Smith, Annie B, NP       ondansetron  (ZOFRAN -ODT) disintegrating tablet 4 mg  4 mg Oral Q8H PRN Smith, Annie B, NP       pantoprazole  (PROTONIX ) EC tablet 40 mg  40 mg Oral Daily Smith, Annie B, NP   40 mg at 04/01/24 1009   QUEtiapine  (SEROQUEL ) tablet 50 mg  50 mg Oral QHS Shalon Councilman, MD   50 mg at 03/31/24 2139   rosuvastatin  (CRESTOR ) tablet 20 mg  20 mg Oral QHS Smith, Annie B, NP   20 mg at 03/31/24 2139   zolpidem  (AMBIEN ) tablet 5 mg  5 mg Oral QHS PRN Smith, Annie B, NP   5 mg at 03/31/24 2139    Lab Results:  Results for orders placed or performed during the hospital encounter of 03/26/24 (from the past 48 hours)  Glucose, capillary     Status: Abnormal   Collection Time: 03/30/24  4:33 PM  Result Value Ref Range   Glucose-Capillary 157 (H) 70 - 99 mg/dL    Comment: Glucose reference range applies only to samples taken after fasting for at least 8 hours.  Glucose, capillary     Status: Abnormal   Collection Time: 03/30/24  7:28 PM  Result Value Ref Range   Glucose-Capillary 209 (H) 70 - 99 mg/dL    Comment: Glucose reference range applies only to samples taken after fasting for at least 8 hours.  Glucose, capillary     Status: Abnormal   Collection Time: 03/31/24 11:27 AM  Result Value Ref Range   Glucose-Capillary 260 (H) 70 - 99 mg/dL  Comment: Glucose reference range applies only to samples taken after fasting for at least 8 hours.  Glucose, capillary     Status: Abnormal   Collection Time: 03/31/24  4:14 PM  Result Value Ref Range   Glucose-Capillary 197 (H) 70 - 99 mg/dL    Comment: Glucose reference range applies only to samples taken after fasting for at least 8 hours.  Glucose, capillary     Status: Abnormal   Collection Time: 03/31/24  7:46 PM  Result Value Ref Range    Glucose-Capillary 238 (H) 70 - 99 mg/dL    Comment: Glucose reference range applies only to samples taken after fasting for at least 8 hours.  Glucose, capillary     Status: Abnormal   Collection Time: 04/01/24  8:52 AM  Result Value Ref Range   Glucose-Capillary 359 (H) 70 - 99 mg/dL    Comment: Glucose reference range applies only to samples taken after fasting for at least 8 hours.  Glucose, capillary     Status: Abnormal   Collection Time: 04/01/24 11:34 AM  Result Value Ref Range   Glucose-Capillary 164 (H) 70 - 99 mg/dL    Comment: Glucose reference range applies only to samples taken after fasting for at least 8 hours.    Blood Alcohol level:  Lab Results  Component Value Date   [facility] <15 03/25/2024   ETH <10 01/21/2019    Metabolic Disorder Labs: Lab Results  Component Value Date   HGBA1C 7.8 (H) 03/25/2024   MPG 177.16 03/25/2024   MPG 165.68 10/28/2022   No results found for: PROLACTIN Lab Results  Component Value Date   CHOL 161 09/15/2016   TRIG 190 (H) 09/15/2016   HDL 41 09/15/2016   CHOLHDL 3.9 09/15/2016   VLDL 38 09/15/2016   LDLCALC 82 09/15/2016    Physical Findings: AIMS:  , ,  ,  ,    CIWA:    COWS:      Psychiatric Specialty Exam:  Presentation  General Appearance:  Casual  Eye Contact: Fleeting  Speech: Slow  Speech Volume: Decreased    Mood and Affect  Mood: Anxious; Depressed  Affect: Depressed; Flat   Thought Process  Thought Processes: Coherent  Orientation:Full (Time, Place and Person)  Thought Content:Logical  Hallucinations:denies  Ideas of Reference:None  Suicidal Thoughts:denies  Homicidal Thoughts:denies   Sensorium  Memory: Immediate Fair; Remote Fair  Judgment: Impaired  Insight: Shallow   Executive Functions  Concentration: Poor  Attention Span: Poor  Recall: [employer] of Knowledge: Fair  Language: Fair   Psychomotor Activity  Psychomotor Activity: No data  recorded  Musculoskeletal: Strength & Muscle Tone: decreased Gait & Station: unsteady Assets  Assets: Manufacturing Systems Engineer; Desire for Improvement    Physical Exam: Physical Exam Vitals and nursing note reviewed.    ROS Blood pressure 132/70, pulse (!) 59, temperature (!) 97.3 F (36.3 C), resp. rate 14, height 6' (1.829 m), weight 112.9 kg, SpO2 98%. Body mass index is 33.77 kg/m.  Diagnosis: Principal Problem:   Uncontrolled type 1 diabetes mellitus with hyperglycemia, with long-term current use of insulin  (HCC) Active Problems:   Acquired hypothyroidism   HTN (hypertension)   Tracheostomy in place (HCC)   Hyperlipidemia, unspecified   MDD (major depressive disorder), recurrent severe, without psychosis (HCC)   Obesity (BMI 30-39.9)   History of DVT (deep vein thrombosis)   Elevated serum creatinine   Clinical Decision Making: Patient currently admitted with worsening depression and passive suicidal ideation in the  context of multiple medical problems.  Patient will be monitored closely for safety  Treatment Plan Summary:  Safety and Monitoring:             -- Voluntary admission to inpatient psychiatric unit for safety, stabilization and treatment             -- Daily contact with patient to assess and evaluate symptoms and progress in treatment             -- Patient's case to be discussed in multi-disciplinary team meeting             -- Observation Level: q15 minute checks             -- Vital signs:  q12 hours             -- Precautions: suicide, elopement, and assault   2. Psychiatric Diagnoses and Treatment:             Worsening anxiety- decrease  Wellbutrin  XL 150 mg Increase Klonopin   0.5 mg qam and 1mg  daily PRN Lexapro   15 mg  Patient has poorly controlled diabetes- discontinue Zyprexa  add Seroquel  50 mg at bedtime to help with anxiety/insomnia   -- The risks/benefits/side-effects/alternatives to this medication were discussed in detail with the  patient and time was given for questions. The patient consents to medication trial.                -- Metabolic profile and EKG monitoring obtained while on an atypical antipsychotic (BMI: Lipid Panel: HbgA1c: QTc:)              -- Encouraged patient to participate in unit milieu and in scheduled group therapies  3. Medical Issues Being Addressed:   Hospitalist and diabetic educator consult placed: * Uncontrolled type 1 diabetes mellitus with hyperglycemia, with long-term current use of insulin  (HCC) High sugars last night and again this morning.  1 dose of NovoLog  10 given.  Increased Semglee  insulin  to 20 units twice daily.  Increase meal coverage to 12 units 3 times daily plus sliding scale.   HTN (hypertension) Patient on Norvasc , chlorthalidone , irbesartan  and Toprol    Acquired hypothyroidism On levothyroxine    History of DVT (deep vein thrombosis) On Eliquis  4. Discharge Planning:   -- Social work and case management to assist with discharge planning and identification of hospital follow-up needs prior to discharge  -- Estimated LOS: 3-4 days  Allyn Foil, MD 04/01/2024, 12:06 PM

## 2024-04-01 NOTE — Progress Notes (Signed)
 Progress Note   Patient: [name] FMW:[id number] DOB: Apr 14, 1961 DOA: 03/26/2024     6 DOS: the patient was seen and examined on 04/01/2024   Brief hospital course: 63 y.o. male with past medical history of type 1 diabetes on insulin  pump, status post laryngectomy and tracheostomy, history of stroke, dyslipidemia, essential hypertension, hypothyroidism, major depression, OCD, who presents to the hospital for treating depression.  Insulin  pump was removed due to concern of suicidal ideation.  Currently glucose running high.   Symptomatically, patient has a depressed mood.  Otherwise no complaint.  11/21.  Sugars very elevated last night and this morning.  Uses insulin  pump at home.  This morning needed to give one-time dose of NovoLog  10 and increase Semglee  insulin  to 20 units twice daily, increased meal coverage to 12 units 3 times daily. 11/22.  Patient states he is having a lot more trouble breathing with his trach.  He is suctioning himself.  He states he is not getting enough food. 11/23.  Patient is getting for some medication for decades.  Complains of some daytime anxiety and concerned about him living alone and cooking at home.  Asked nursing staff to cut one of the paper down pant leg so he can get his prosthesis on. 11/24.  Patient complains of daytime anxiety.  Complained of jock itch and wanted a cream. 11/25.  Patient complains of fatigue even though he slept a full 8 hours.   Assessment and Plan: * Uncontrolled type 1 diabetes mellitus with hyperglycemia, with long-term current use of insulin  (HCC) Continue increased dose of Semglee  insulin  to 22 units twice daily.  Continue short acting insulin  12 units 3 times daily plus sliding scale.  Patient uses insulin  pump at home and plans to go back on this once discharged.  Tracheostomy in place [facility]) Patient having transmitted wheeze.  Chest x-ray negative.  Hold off on antibiotics.  Patient states he needs to sleep with the  trach in or else the hole closes up.  He feels like sometimes he has his air cut off if he is sleeping with the trach.  This could be the reason why he is fatigued.  Elevated serum creatinine Creatinine 1.46.  Creatinine on presentation 1.18.  Advised to drink fluid.  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  Currently does not meet criteria for acute kidney injury.  Hold Hygroton  and Avapro  for now.  I ordered a BMP for this morning but he told them to go away.  Hopefully they will draw this afternoon.  HTN (hypertension) Patient on Norvasc , and Toprol .  Will hold Hygroton  and irbesartan  secondary to elevation in creatinine today.  Acquired hypothyroidism On levothyroxine , check a TSH since patient complains of fatigue.  History of DVT (deep vein thrombosis) On Eliquis   Obesity (BMI 30-39.9) BMI 33.77  MDD (major depressive disorder), recurrent severe, without psychosis (HCC) Patient on Lexapro , Zyprexa , Wellbutrin , Seroquel  and clonazepam  as per psychiatry.  Hyperlipidemia, unspecified On Crestor , check CK since patient complaining of fatigue.        Subjective: Patient states he is feels tired even though he slept a full 8 hours.  Patient still complains of some depression.  States he cannot sleep without the trach and or else to hold close up.  Sometimes feels like it is hard to breathe through the trach at night.  Consulted for diabetes management  Physical Exam: Vitals:   03/30/24 1910 03/31/24 0724 03/31/24 1914 04/01/24 0730  BP: (!) 122/59 129/60 ROLLEN)  120/92 132/70  Pulse: 65 (!) 56 65 (!) 59  Resp: 14 19 14    Temp: (!) 97.3 F (36.3 C) 97.6 F (36.4 C) 98.2 F (36.8 C) (!) 97.3 F (36.3 C)  TempSrc:      SpO2: 98% 99% 98% 98%  Weight:      Height:       Physical Exam HENT:     Head: Normocephalic.  Eyes:     General: Lids are normal.  Cardiovascular:     Rate and Rhythm: Normal rate and regular rhythm.     Heart sounds: Normal heart sounds,  S1 normal and S2 normal.  Pulmonary:     Breath sounds: No decreased breath sounds, wheezing, rhonchi or rales.     Comments: Upper airway transmitted sounds Abdominal:     Palpations: Abdomen is soft.     Tenderness: There is no abdominal tenderness.  Musculoskeletal:     Right lower leg: No swelling.     Left Lower Extremity: Left leg is amputated below knee.  Skin:    General: Skin is warm.     Comments: Slight redness right antecubital Patient refused me looking in his groin today.  Neurological:     Mental Status: He is alert.     Data Reviewed: I ordered labs from this morning but hopefully they will draw this afternoon  Family Communication: As per psychiatry team  Disposition: As per psychiatry team  Planned Discharge Destination: Home with Home Health    Time spent: 28 minutes  Author: Charlie Patterson, MD 04/01/2024 1:59 PM  For on call review [email or web address].

## 2024-04-01 NOTE — Group Note (Signed)
 Date:  04/01/2024 Time:  11:15 AM  Group Topic/Focus:   Thanksgiving Gratitude  Gratitude helps us  slow down and see the good that's already here. Science shows that practicing gratitude can boost happiness, lower stress, and even strengthen relationships. But gratitude is more than positive thinking it's a way of shifting our perspective, one day at a time. Choosing gratitude every day is monumental in shaping the quality of your daily life for the better. But, gratitude grows best in community. Gratitude is powerful. It changes our perspective, softens our hearts, and helps us  see the good even when life feels heavy. Studies show that gratitude has measurable benefits, it boosts happiness, lowers stress, and strengthens relationships.   Participation Level:  Did Not Attend   [name] Gavel 04/01/2024, 11:15 AM

## 2024-04-01 NOTE — Group Note (Signed)
 Recreation Therapy Group Note   Group Topic:Health and Wellness  Group Date: 04/01/2024 Start Time: 1445 End Time: 1530 Facilitators: Celestia Jeoffrey BRAVO, LRT, CTRS Location: Courtyard  Group Description: Outdoor Recreation. Patients had the option to play corn hole, ring toss, UNO, or listening to music while outside in the courtyard getting fresh air and sunlight. Patients helped water and prune the raised garden beds. LRT and patients discussed things that they enjoy doing in their free time outside of the hospital. LRT encouraged patients to drink water after being active and getting their heart rate up.   Goal Area(s) Addressed: Patient will identify leisure interests.  Patient will practice healthy decision making. Patient will engage in recreation activity.   Affect/Mood: Appropriate   Participation Level: Minimal    Clinical Observations/Individualized Feedback: Averie came late to group. Pt was present for less than half of group.   Plan: Continue to engage patient in RT group sessions 2-3x/week.   Jeoffrey BRAVO Celestia, LRT, CTRS 04/01/2024 4:33 PM

## 2024-04-01 NOTE — Plan of Care (Signed)
  Problem: Coping: Goal: Coping ability will improve Outcome: Progressing   Problem: Self-Concept: Goal: Ability to disclose and discuss suicidal ideas will improve Outcome: Progressing Goal: Will verbalize positive feelings about self Outcome: Progressing Note:

## 2024-04-02 LAB — GLUCOSE, CAPILLARY
Glucose-Capillary: 125 mg/dL — ABNORMAL HIGH (ref 70–99)
Glucose-Capillary: 131 mg/dL — ABNORMAL HIGH (ref 70–99)
Glucose-Capillary: 112 mg/dL — ABNORMAL HIGH (ref 70–99)
Glucose-Capillary: 116 mg/dL — ABNORMAL HIGH (ref 70–99)

## 2024-04-02 LAB — CBC
HCT: 42.9 % (ref 39.0–52.0)
Hemoglobin: 14.2 g/dL (ref 13.0–17.0)
MCH: 29.6 pg (ref 26.0–34.0)
MCHC: 33.1 g/dL (ref 30.0–36.0)
MCV: 89.6 fL (ref 80.0–100.0)
Platelets: 262 K/uL (ref 150–400)
RBC: 4.79 MIL/uL (ref 4.22–5.81)
RDW: 13 % (ref 11.5–15.5)
WBC: 5.6 K/uL (ref 4.0–10.5)
nRBC: 0 % (ref 0.0–0.2)

## 2024-04-02 LAB — BASIC METABOLIC PANEL WITH GFR
Anion gap: 9 (ref 5–15)
BUN: 41 mg/dL — ABNORMAL HIGH (ref 8–23)
CO2: 28 mmol/L (ref 22–32)
Calcium: 9.1 mg/dL (ref 8.9–10.3)
Chloride: 103 mmol/L (ref 98–111)
Creatinine, Ser: 1.25 mg/dL — ABNORMAL HIGH (ref 0.61–1.24)
GFR, Estimated: >60 mL/min (ref >60)
Glucose, Bld: 107 mg/dL — ABNORMAL HIGH (ref 70–99)
Potassium: 4.1 mmol/L (ref 3.5–5.1)
Sodium: 140 mmol/L (ref 135–145)

## 2024-04-02 LAB — TSH: TSH: 2.8 u[IU]/mL (ref 0.350–4.500)

## 2024-04-02 LAB — CK: Total CK: 51 U/L (ref 49–397)

## 2024-04-02 MED ORDER — FUROSEMIDE 20 MG PO TABS
40.0000 mg | ORAL_TABLET | Freq: Once | ORAL | Status: AC
  Administered 2024-04-02: 40 mg via ORAL
  Filled 2024-04-02: qty 2

## 2024-04-02 MED ORDER — ZOLPIDEM TARTRATE 5 MG PO TABS: 5.0000 mg | ORAL_TABLET | Freq: Every day | ORAL | Status: DC

## 2024-04-02 MED ORDER — CHLORTHALIDONE 25 MG PO TABS
25.0000 mg | ORAL_TABLET | Freq: Every day | ORAL | Status: DC
  Administered 2024-04-02 – 2024-04-04 (×3): 25 mg via ORAL
  Filled 2024-04-02 (×3): qty 1

## 2024-04-02 MED ORDER — ZOLPIDEM TARTRATE 5 MG PO TABS
10.0000 mg | ORAL_TABLET | Freq: Every day | ORAL | Status: DC
  Administered 2024-04-02 – 2024-04-03 (×2): 10 mg via ORAL
  Filled 2024-04-02 (×2): qty 2

## 2024-04-02 NOTE — Progress Notes (Signed)
   04/02/24 1353  Psych Admission Type (Psych Patients Only)  Admission Status Voluntary  Psychosocial Assessment  Patient Complaints Anxiety  Eye Contact Fair  Facial Expression Animated  Affect Appropriate to circumstance  Speech Logical/coherent  Interaction Assertive  Motor Activity Slow  Appearance/Hygiene Unremarkable  Behavior Characteristics Cooperative  Mood Sad;Pleasant  Thought Process  Coherency WDL  Content WDL  Delusions None reported or observed  Perception WDL  Hallucination None reported or observed  Judgment Impaired  Confusion None  Danger to Self  Current suicidal ideation? Denies  Self-Injurious Behavior No self-injurious ideation or behavior indicators observed or expressed   Agreement Not to Harm Self Yes  Description of Agreement verabl  Danger to Others  Danger to Others None reported or observed

## 2024-04-02 NOTE — Progress Notes (Signed)
 [facility] MD Progress Note  04/02/2024 12:20 PM [name]  MRN:  [id number] [name] is a 63 y.o. male admitted: Presented to the ED for 03/25/2024  5:37 PM for for worsening depression and active suicidal ideation in the setting of functional decline and multiple failed attempts to be evaluated during recent ED visits. He carries the psychiatric diagnoses of Major Depressive Disorder, recurrent severe, and has a past medical history of diabetes mellitus, bilateral below-knee amputations, tracheostomy, and difficulty adjusting to chronic medical disability. Patient is admitted to [facility] unit with Q15 min safety monitoring. Multidisciplinary team approach is offered. Medication management; group/milieu therapy is offered.  Subjective:  Chart reviewed, case discussed in multidisciplinary meeting, patient seen during rounds.   Patient is noted to be resting in bed.  He talks about the amputation stump being swollen and not fitting in the prosthetic leg which impairs his mobility.  He did acknowledge needing individual therapy.  He rates his depression as 1 out of 10, 10 being the worst and anxiety as 5 out of 10.  He denies any panic attacks.  He denies SI/HI/plan.  He denies auditory/visual hallucinations.  Patient is being followed by hospitalist and managing the blood sugars and the amputation site.  Per hospitalist vascular team expressed no concerns and given his labs there are no signs of infection in the amputation stump. Past Psychiatric History: see h&P Family History:  Family History  Problem Relation Age of Onset   Osteoporosis Mother    Diabetes Mother    Hypertension Father    Mental illness Neg Hx    Social History:  Social History   Substance and Sexual Activity  Alcohol Use Yes   Alcohol/week: 2.0 standard drinks of alcohol   Types: 2 Shots of liquor per week     Social History   Substance and Sexual Activity  Drug Use No   Comment: Pt denied; UDS not available     Social History   Socioeconomic History   Marital status: Widowed    Spouse name: [name]   Number of children: Not on file   Years of education: Not on file   Highest education level: Not on file  Occupational History   Not on file  Tobacco Use   Smoking status: Former    Current packs/day: 0.00    Types: Cigarettes    Quit date: 04/09/2013    Years since quitting: 10.9   Smokeless tobacco: Never  Vaping Use   Vaping status: Never Used  Substance and Sexual Activity   Alcohol use: Yes    Alcohol/week: 2.0 standard drinks of alcohol    Types: 2 Shots of liquor per week   Drug use: No    Comment: Pt denied; UDS not available   Sexual activity: Yes    Partners: Female    Birth control/protection: Condom  Other Topics Concern   Not on file  Social History Narrative   Not on file   Social Drivers of Health   Financial Resource Strain: Low Risk  (12/19/2023)   Received from [facility]   Overall Financial Resource Strain (CARDIA)    Difficulty of Paying Living Expenses: Not hard at all  Food Insecurity: No Food Insecurity (03/26/2024)   Hunger Vital Sign    Worried About Running Out of Food in the Last Year: Never true    Ran Out of Food in the Last Year: Never true  Transportation Needs: No Transportation Needs (03/26/2024)  PRAPARE - Administrator, Civil Service (Medical): No    Lack of Transportation (Non-Medical): No  Physical Activity: Sufficiently Active (07/12/2023)   Exercise Vital Sign    Days of Exercise per Week: 7 days    Minutes of Exercise per Session: 30 min  Stress: Stress Concern Present (07/12/2023)   [employer] of Occupational Health - Occupational Stress Questionnaire    Feeling of Stress : Very much  Social Connections: Socially Isolated (07/12/2023)   Social Connection and Isolation Panel    Frequency of Communication with Friends and Family: More than three times a week    Frequency of Social Gatherings with  Friends and Family: Never    Attends Religious Services: Never    Database Administrator or Organizations: No    Attends Banker Meetings: Never    Marital Status: Widowed   Past Medical History:  Past Medical History:  Diagnosis Date   Depression    Diabetes (HCC)    Insulin  Pump   Diabetes mellitus type I (HCC)    Diabetes mellitus without complication (HCC)    GERD (gastroesophageal reflux disease)    H/O laryngectomy    Heel bone fracture    History of embolic stroke    Hyperlipidemia    Hypertension    Radicular pain of right lower extremity    Stroke (HCC)    Suicide attempt (HCC) 2014   damaged larynx - tracheostomy   Thyroid  disease     Past Surgical History:  Procedure Laterality Date   COLONOSCOPY WITH PROPOFOL  N/A 05/15/2018   Procedure: COLONOSCOPY WITH PROPOFOL ;  Surgeon: [name], Ladell POUR, MD;  Location: [facility] ENDOSCOPY;  Service: Gastroenterology;  Laterality: N/A;   ESOPHAGOGASTRODUODENOSCOPY N/A 05/15/2018   Procedure: ESOPHAGOGASTRODUODENOSCOPY (EGD);  Surgeon: [name], Ladell POUR, MD;  Location: [facility] ENDOSCOPY;  Service: Gastroenterology;  Laterality: N/A;   FRACTURE SURGERY     Heel bone reconstruction Left    HERNIA REPAIR  02/2011   Umbilical hernia repair    LARYNGECTOMY     LOWER EXTREMITY ANGIOGRAPHY Left 01/31/2021   Procedure: LOWER EXTREMITY ANGIOGRAPHY;  Surgeon: Marea Selinda RAMAN, MD;  Location: [facility] INVASIVE CV LAB;  Service: Cardiovascular;  Laterality: Left;   LOWER EXTREMITY ANGIOGRAPHY Right 02/14/2021   Procedure: LOWER EXTREMITY ANGIOGRAPHY;  Surgeon: Marea Selinda RAMAN, MD;  Location: [facility] INVASIVE CV LAB;  Service: Cardiovascular;  Laterality: Right;   NECK SURGERY     fusion   SPINE SURGERY     TRACHEOSTOMY  2014   from SI attempt    Current Medications: Current Facility-Administered Medications  Medication Dose Route Frequency Provider Last Rate Last Admin   acetaminophen  (TYLENOL ) tablet 650 mg  650 mg Oral Q6H PRN Smith, Annie B,  NP   650 mg at 04/01/24 1937   alum & mag hydroxide-simeth (MAALOX/MYLANTA) 200-200-20 MG/5ML suspension 30 mL  30 mL Oral Q4H PRN Smith, Annie B, NP       amLODipine  (NORVASC ) tablet 5 mg  5 mg Oral Daily Smith, Annie B, NP   5 mg at 04/02/24 9061   apixaban  (ELIQUIS ) tablet 5 mg  5 mg Oral BID Smith, Annie B, NP   5 mg at 04/02/24 9062   buPROPion  (WELLBUTRIN  XL) 24 hr tablet 150 mg  150 mg Oral Daily Gwendolyn Mclees, MD   150 mg at 04/02/24 9062   clonazePAM  (KLONOPIN ) tablet 0.5 mg  0.5 mg Oral Q breakfast Anuel Sitter, MD   0.5 mg at 04/02/24 [phone]  clonazePAM  (KLONOPIN ) tablet 1 mg  1 mg Oral Daily PRN Smith, Annie B, NP   1 mg at 04/01/24 2106   diphenhydrAMINE -zinc  acetate (BENADRYL ) 2-0.1 % cream   Topical TID PRN Bindu Docter, MD   Given at 03/30/24 1259   escitalopram  (LEXAPRO ) tablet 15 mg  15 mg Oral Daily Merida Alcantar, MD   15 mg at 04/02/24 9062   hydrocortisone  cream 1 %   Topical BID Josette Ade, MD   1 Application at 04/02/24 9057   insulin  aspart (novoLOG ) injection 0-15 Units  0-15 Units Subcutaneous TID WC Smith, Annie B, NP   2 Units at 04/02/24 9188   insulin  aspart (novoLOG ) injection 0-5 Units  0-5 Units Subcutaneous QHS Smith, Annie B, NP   2 Units at 03/31/24 2139   insulin  aspart (novoLOG ) injection 12 Units  12 Units Subcutaneous TID WC Josette Ade, MD   12 Units at 04/02/24 9187   insulin  glargine-yfgn (SEMGLEE ) injection 22 Units  22 Units Subcutaneous BID Josette Ade, MD   22 Units at 04/02/24 0945   levothyroxine  (SYNTHROID ) tablet 75 mcg  75 mcg Oral Q0600 Smith, Annie B, NP   75 mcg at 04/02/24 9446   loperamide  (IMODIUM ) capsule 4 mg  4 mg Oral PRN Nickolaos Brallier, MD   4 mg at 04/02/24 9046   magnesium  hydroxide (MILK OF MAGNESIA) suspension 30 mL  30 mL Oral Daily PRN Smith, Annie B, NP       metoprolol  succinate (TOPROL -XL) 24 hr tablet 25 mg  25 mg Oral Daily Smith, Annie B, NP   25 mg at 04/02/24 9062   nystatin  cream (MYCOSTATIN )    Topical BID Josette Ade, MD   1 Application at 04/02/24 0941   OLANZapine  (ZYPREXA ) injection 5 mg  5 mg Intramuscular TID PRN Smith, Annie B, NP       OLANZapine  zydis (ZYPREXA ) disintegrating tablet 5 mg  5 mg Oral TID PRN Smith, Annie B, NP       ondansetron  (ZOFRAN -ODT) disintegrating tablet 4 mg  4 mg Oral Q8H PRN Smith, Annie B, NP       pantoprazole  (PROTONIX ) EC tablet 40 mg  40 mg Oral Daily Smith, Annie B, NP   40 mg at 04/02/24 9062   QUEtiapine  (SEROQUEL ) tablet 50 mg  50 mg Oral QHS Saidy Ormand, MD   50 mg at 04/01/24 2107   rosuvastatin  (CRESTOR ) tablet 20 mg  20 mg Oral QHS Smith, Annie B, NP   20 mg at 04/01/24 2106   zolpidem  (AMBIEN ) tablet 5 mg  5 mg Oral QHS PRN Smith, Annie B, NP   5 mg at 04/01/24 2107    Lab Results:  Results for orders placed or performed during the hospital encounter of 03/26/24 (from the past 48 hours)  Glucose, capillary     Status: Abnormal   Collection Time: 03/31/24  4:14 PM  Result Value Ref Range   Glucose-Capillary 197 (H) 70 - 99 mg/dL    Comment: Glucose reference range applies only to samples taken after fasting for at least 8 hours.  Glucose, capillary     Status: Abnormal   Collection Time: 03/31/24  7:46 PM  Result Value Ref Range   Glucose-Capillary 238 (H) 70 - 99 mg/dL    Comment: Glucose reference range applies only to samples taken after fasting for at least 8 hours.  Glucose, capillary     Status: Abnormal   Collection Time: 04/01/24  8:52 AM  Result Value Ref  Range   Glucose-Capillary 359 (H) 70 - 99 mg/dL    Comment: Glucose reference range applies only to samples taken after fasting for at least 8 hours.  Glucose, capillary     Status: Abnormal   Collection Time: 04/01/24 11:34 AM  Result Value Ref Range   Glucose-Capillary 164 (H) 70 - 99 mg/dL    Comment: Glucose reference range applies only to samples taken after fasting for at least 8 hours.  Glucose, capillary     Status: Abnormal   Collection Time:  04/01/24  4:18 PM  Result Value Ref Range   Glucose-Capillary 190 (H) 70 - 99 mg/dL    Comment: Glucose reference range applies only to samples taken after fasting for at least 8 hours.  Glucose, capillary     Status: Abnormal   Collection Time: 04/01/24  7:36 PM  Result Value Ref Range   Glucose-Capillary 128 (H) 70 - 99 mg/dL    Comment: Glucose reference range applies only to samples taken after fasting for at least 8 hours.  CBC     Status: None   Collection Time: 04/02/24  7:27 AM  Result Value Ref Range   WBC 5.6 4.0 - 10.5 K/uL   RBC 4.79 4.22 - 5.81 MIL/uL   Hemoglobin 14.2 13.0 - 17.0 g/dL   HCT 57.0 60.9 - 47.9 %   MCV 89.6 80.0 - 100.0 fL   MCH 29.6 26.0 - 34.0 pg   MCHC 33.1 30.0 - 36.0 g/dL   RDW 86.9 88.4 - 84.4 %   Platelets 262 150 - 400 K/uL   nRBC 0.0 0.0 - 0.2 %    Comment: Performed at [facility], [address], [place], [place] [address]  Basic metabolic panel     Status: Abnormal   Collection Time: 04/02/24  7:27 AM  Result Value Ref Range   Sodium 140 135 - 145 mmol/L   Potassium 4.1 3.5 - 5.1 mmol/L   Chloride 103 98 - 111 mmol/L   CO2 28 22 - 32 mmol/L   Glucose, Bld 107 (H) 70 - 99 mg/dL    Comment: Glucose reference range applies only to samples taken after fasting for at least 8 hours.   BUN 41 (H) 8 - 23 mg/dL   Creatinine, Ser 8.74 (H) 0.61 - 1.24 mg/dL   Calcium  9.1 8.9 - 10.3 mg/dL   GFR, Estimated >39 >39 mL/min    Comment: (NOTE) Calculated using the CKD-EPI Creatinine Equation (2021)    Anion gap 9 5 - 15    Comment: Performed at [facility], [address] [place], [place] [address]  TSH     Status: None   Collection Time: 04/02/24  7:27 AM  Result Value Ref Range   TSH 2.800 0.350 - 4.500 uIU/mL    Comment: Performed at [facility], [address] [place], [place] [address]  CK     Status: None   Collection Time: 04/02/24  7:27 AM  Result Value Ref Range   Total CK 51 49 - 397 U/L    Comment:  Performed at [facility], [address] [place], [place] [address]  Glucose, capillary     Status: Abnormal   Collection Time: 04/02/24  7:36 AM  Result Value Ref Range   Glucose-Capillary 125 (H) 70 - 99 mg/dL    Comment: Glucose reference range applies only to samples taken after fasting for at least 8 hours.  Glucose, capillary  Status: Abnormal   Collection Time: 04/02/24 11:35 AM  Result Value Ref Range   Glucose-Capillary 131 (H) 70 - 99 mg/dL    Comment: Glucose reference range applies only to samples taken after fasting for at least 8 hours.    Blood Alcohol level:  Lab Results  Component Value Date   [facility] <15 03/25/2024   ETH <10 01/21/2019    Metabolic Disorder Labs: Lab Results  Component Value Date   HGBA1C 7.8 (H) 03/25/2024   MPG 177.16 03/25/2024   MPG 165.68 10/28/2022   No results found for: PROLACTIN Lab Results  Component Value Date   CHOL 161 09/15/2016   TRIG 190 (H) 09/15/2016   HDL 41 09/15/2016   CHOLHDL 3.9 09/15/2016   VLDL 38 09/15/2016   LDLCALC 82 09/15/2016    Physical Findings: AIMS:  , ,  ,  ,    CIWA:    COWS:      Psychiatric Specialty Exam:  Presentation  General Appearance:  Casual  Eye Contact: Fleeting  Speech: Slow  Speech Volume: Decreased    Mood and Affect  Mood: fine  Affect: stable   Thought Process  Thought Processes: Coherent  Orientation:Full (Time, Place and Person)  Thought Content:Logical  Hallucinations:denies  Ideas of Reference:None  Suicidal Thoughts:denies  Homicidal Thoughts:denies   Sensorium  Memory: Immediate Fair; Remote Fair  Judgment: Impaired  Insight: Shallow   Executive Functions  Concentration: Poor  Attention Span: Poor  Recall: [employer] of Knowledge: Fair  Language: Fair   Psychomotor Activity  Psychomotor Activity: No data recorded  Musculoskeletal: Strength & Muscle Tone: decreased Gait & Station:  unsteady Assets  Assets: Manufacturing Systems Engineer; Desire for Improvement    Physical Exam: Physical Exam Vitals and nursing note reviewed.    ROS Blood pressure 123/68, pulse 63, temperature (!) 97.4 F (36.3 C), resp. rate 18, height 6' (1.829 m), weight 112.9 kg, SpO2 96%. Body mass index is 33.77 kg/m.  Diagnosis: Principal Problem:   Uncontrolled type 1 diabetes mellitus with hyperglycemia, with long-term current use of insulin  (HCC) Active Problems:   Acquired hypothyroidism   HTN (hypertension)   Tracheostomy in place (HCC)   Hyperlipidemia, unspecified   MDD (major depressive disorder), recurrent severe, without psychosis (HCC)   Obesity (BMI 30-39.9)   History of DVT (deep vein thrombosis)   Elevated serum creatinine   Clinical Decision Making: Patient currently admitted with worsening depression and passive suicidal ideation in the context of multiple medical problems.  Patient will be monitored closely for safety  Treatment Plan Summary:  Safety and Monitoring:             -- Voluntary admission to inpatient psychiatric unit for safety, stabilization and treatment             -- Daily contact with patient to assess and evaluate symptoms and progress in treatment             -- Patient's case to be discussed in multi-disciplinary team meeting             -- Observation Level: q15 minute checks             -- Vital signs:  q12 hours             -- Precautions: suicide, elopement, and assault   2. Psychiatric Diagnoses and Treatment:             Worsening anxiety- keep   Wellbutrin  XL 150 mg  Klonopin   0.5 mg qam and 1mg  daily PRN Lexapro   15 mg  Patient has poorly controlled diabetes- discontinue Zyprexa  add Seroquel  50 mg at bedtime to help with anxiety/insomnia   -- The risks/benefits/side-effects/alternatives to this medication were discussed in detail with the patient and time was given for questions. The patient consents to medication trial.                 -- Metabolic profile and EKG monitoring obtained while on an atypical antipsychotic (BMI: Lipid Panel: HbgA1c: QTc:)              -- Encouraged patient to participate in unit milieu and in scheduled group therapies  3. Medical Issues Being Addressed:   Hospitalist and diabetic educator consult placed: * Uncontrolled type 1 diabetes mellitus with hyperglycemia, with long-term current use of insulin  (HCC) High sugars last night and again this morning.  1 dose of NovoLog  10 given.  Increased Semglee  insulin  to 20 units twice daily.  Increase meal coverage to 12 units 3 times daily plus sliding scale.   HTN (hypertension) Patient on Norvasc , chlorthalidone , irbesartan  and Toprol    Acquired hypothyroidism On levothyroxine    History of DVT (deep vein thrombosis) On Eliquis  4. Discharge Planning:   -- Social work and case management to assist with discharge planning and identification of hospital follow-up needs prior to discharge  -- Estimated LOS: 3-4 days  Allyn Foil, MD 04/02/2024, 12:20 PM

## 2024-04-02 NOTE — Group Note (Signed)
 Date:  04/02/2024 Time:  8:40 PM  Group Topic/Focus:  Wrap-Up Group:   The focus of this group is to help patients review their daily goal of treatment and discuss progress on daily workbooks.    Participation Level:  Active  Participation Quality:  Appropriate  Affect:  Excited  Cognitive:  Alert  Insight: Appropriate  Engagement in Group:  Engaged  Modes of Intervention:  Activity  Additional Comments:    Dorota Heinrichs C Mathayus Stanbery 04/02/2024, 8:40 PM

## 2024-04-02 NOTE — Plan of Care (Signed)
  Problem: Coping: Goal: Coping ability will improve Outcome: Progressing Goal: Will verbalize feelings Outcome: Progressing   Problem: Self-Concept: Goal: Will verbalize positive feelings about self Outcome: Progressing Note: Patient is on track. Patient will maintain adherence

## 2024-04-02 NOTE — Plan of Care (Signed)
  Problem: Education: Goal: Knowledge of the prescribed therapeutic regimen will improve Outcome: Progressing   Problem: Coping: Goal: Will verbalize feelings Outcome: Progressing   Problem: Coping: Goal: Coping ability will improve Outcome: Progressing   Problem: Education: Goal: Knowledge about tracheostomy care/management will improve Outcome: Progressing

## 2024-04-02 NOTE — Progress Notes (Signed)
   04/02/24 0545  15 Minute Checks  Location Bedroom  Visual Appearance Calm  Behavior Sleeping  Sleep (Behavioral Health Patients Only)  Calculate sleep? (Click Yes once per 24 hr at 0600 safety check) Yes  Documented sleep last 24 hours 9.25

## 2024-04-02 NOTE — Group Note (Signed)
 Date:  04/02/2024 Time:  10:52 AM  Group Topic/Focus:    Gratitude Gratitude is the act of recognizing and acknowledging the good things that happen, resulting in a state of appreciation [employer], 2010). Often when we consider what we are grateful for, overt and profound life experiences, circumstances, and events come to mind. Gratitude, thankfulness, or gratefulness is a feeling of appreciation by a recipient of another's kindness. This kindness can be gifts, help, favors, or another form of generosity to another person. Gratitude seems to reduce depression symptoms people with a grateful mindset report higher satisfaction with life, strong social relationships and more self-esteem than those who don't practice gratitude. But it's also possible that depressed people are less likely to practice gratitude.  Participation Level:  Did Not Attend   [name] 04/02/2024, 10:52 AM

## 2024-04-02 NOTE — Consult Note (Signed)
 WOC Nurse Consult Note: Reason for Consult:Asked to assess for swelling left stump at AKA site.  He is seen by [facility] physicians and I am unclear on treatment plan.  Recommend PT to evaluate and treat for stump care to minimize edema.  Wound type: post surgical edema and fluid retention Pressure Injury POA: NA  Drainage (amount, consistency, odor) serosanguinous oozing from suture line site Periwound:edema Dressing procedure/placement/frequency: Recommend PT to evaluate and make recommendations for stump care.  Will not follow at this time.  Please re-consult if needed.  Darice Cooley MSN, RN, FNP-BC CWON Wound, Ostomy, Continence Nurse Outpatient [facility] [phone] Work cell phone:  [phone]

## 2024-04-02 NOTE — Progress Notes (Signed)
 PROGRESS NOTE    [name]  FMW:[id number] DOB: 03-13-1961 DOA: 03/26/2024 PCP: [name], MD    Brief Narrative:   63 y.o. male with past medical history of type 1 diabetes on insulin  pump, status post laryngectomy and tracheostomy, history of stroke, dyslipidemia, essential hypertension, hypothyroidism, major depression, OCD, who presents to the hospital for treating depression.  Insulin  pump was removed due to concern of suicidal ideation.  Currently glucose running high.   Symptomatically, patient has a depressed mood.  Otherwise no complaint.   11/21.  Sugars very elevated last night and this morning.  Uses insulin  pump at home.  This morning needed to give one-time dose of NovoLog  10 and increase Semglee  insulin  to 20 units twice daily, increased meal coverage to 12 units 3 times daily. 11/22.  Patient states he is having a lot more trouble breathing with his trach.  He is suctioning himself.  He states he is not getting enough food. 11/23.  Patient is getting for some medication for decades.  Complains of some daytime anxiety and concerned about him living alone and cooking at home.  Asked nursing staff to cut one of the paper down pant leg so he can get his prosthesis on. 11/24.  Patient complains of daytime anxiety.  Complained of jock itch and wanted a cream. 11/25.  Patient complains of fatigue even though he slept a full 8 hours. 11/26: Patient seen and examined.  Overall improved.  Glycemic control improved.  Main complaint is swelling of the left stump and inability for prosthesis to be appropriately placed  Assessment & Plan:   Principal Problem:   Uncontrolled type 1 diabetes mellitus with hyperglycemia, with long-term current use of insulin  (HCC) Active Problems:   Tracheostomy in place (HCC)   Elevated serum creatinine   HTN (hypertension)   Acquired hypothyroidism   Hyperlipidemia, unspecified   MDD (major depressive disorder), recurrent severe, without  psychosis (HCC)   Obesity (BMI 30-39.9)   History of DVT (deep vein thrombosis)  * Uncontrolled type 1 diabetes mellitus with hyperglycemia, with long-term current use of insulin  (HCC) Improved glycemic control.  Continue Semglee  22 units twice daily.  Continue NovoLog  12 units 3 times daily with meals.  Continue sliding scale coverage.  At time of discharge patient will resume usage of his insulin  pump.   Tracheostomy in place [facility]) Patient having transmitted wheeze.  Chest x-ray negative.  Low suspicion for infection.  Hold on antibiotics.    Elevated serum creatinine Creatinine peaked at 1.46.  Subsequently downtrending.  Approximately back at baseline.  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  Can resume hydrochlorothiazide .  Hold Avapro  for now.  Lasix  40 mg p.o. x 1.  Repeat BMP in a.m.    HTN (hypertension) Patient on Norvasc , and Toprol .   Restart hydrochlorothiazide  11/26 Lasix  40 mg p.o. x 1   Acquired hypothyroidism TS H within normal limits.  Continue Synthroid  75 mcg daily   History of DVT (deep vein thrombosis) On Eliquis    Obesity (BMI 30-39.9) BMI 33.77   MDD (major depressive disorder), recurrent severe, without psychosis (HCC) Patient on Lexapro , Zyprexa , Wellbutrin , Seroquel  and clonazepam  as per psychiatry.   Hyperlipidemia, unspecified Continue Crestor .  CK normal limits.  No evidence of rhabdomyolysis.   DVT prophylaxis: Per primary Code Status: Full Family Communication: None Disposition Plan: Per primary   Level of care:   Consultants:  Hospitalist  Procedures:  None  Antimicrobials: None   Subjective: Seen  and examined.  Sitting up in chair.  Overall stable.  No pain complaints.  Main complaint is swelling of left lower extremity stump.  Objective: Vitals:   04/01/24 1907 04/02/24 0736 04/02/24 0937 04/02/24 0938  BP: (!) 155/73 123/68 123/68 123/68  Pulse: 62 63 63   Resp: 14 18    Temp: 98.2 F  (36.8 C) (!) 97.4 F (36.3 C)    TempSrc:      SpO2: 98% 96%    Weight:      Height:       No intake or output data in the 24 hours ending 04/02/24 1554 Filed Weights   03/26/24 2321  Weight: 112.9 kg    Examination:  General exam: Appears calm and comfortable  Respiratory system: Tracheostomy, transmitted breath sounds.  Lungs clear.  Room air Cardiovascular system: S1-S2, RRR, no murmurs, no pedal edema Gastrointestinal system: BS, soft, NT/ND, normal bowel sounds Central nervous system: Alert and oriented. No focal neurological deficits. Extremities: Lower extremity BKA Skin: No rashes, lesions or ulcers Psychiatry: Judgement and insight appear normal. Mood & affect appropriate.     Data Reviewed: I have personally reviewed following labs and imaging studies  CBC: Recent Labs  Lab 03/30/24 1027 04/02/24 0727  WBC 5.2 5.6  HGB 14.1 14.2  HCT 41.8 42.9  MCV 88.7 89.6  PLT 269 262   Basic Metabolic Panel: Recent Labs  Lab 03/27/24 1055 03/30/24 1027 04/02/24 0727  NA  --  135 140  K  --  4.2 4.1  CL  --  99 103  CO2  --  26 28  GLUCOSE 381* 273* 107*  BUN  --  42* 41*  CREATININE  --  1.46* 1.25*  CALCIUM   --  9.2 9.1   GFR: Estimated Creatinine Clearance: 80.5 mL/min (A) (by C-G formula based on SCr of 1.25 mg/dL (H)). Liver Function Tests: No results for input(s): AST, ALT, ALKPHOS, BILITOT, PROT, ALBUMIN in the last 168 hours. No results for input(s): LIPASE, AMYLASE in the last 168 hours. No results for input(s): AMMONIA in the last 168 hours. Coagulation Profile: No results for input(s): INR, PROTIME in the last 168 hours. Cardiac Enzymes: Recent Labs  Lab 04/02/24 0727  CKTOTAL 51   BNP (last 3 results) No results for input(s): PROBNP in the last 8760 hours. HbA1C: No results for input(s): HGBA1C in the last 72 hours. CBG: Recent Labs  Lab 04/01/24 1134 04/01/24 1618 04/01/24 1936 04/02/24 0736  04/02/24 1135  GLUCAP 164* 190* 128* 125* 131*   Lipid Profile: No results for input(s): CHOL, HDL, LDLCALC, TRIG, CHOLHDL, LDLDIRECT in the last 72 hours. Thyroid  Function Tests: Recent Labs    04/02/24 0727  TSH 2.800   Anemia Panel: No results for input(s): VITAMINB12, FOLATE, FERRITIN, TIBC, IRON, RETICCTPCT in the last 72 hours. Sepsis Labs: No results for input(s): PROCALCITON, LATICACIDVEN in the last 168 hours.  Recent Results (from the past 240 hours)  SARS Coronavirus 2 by RT PCR (hospital order, performed in [facility] hospital lab) *cepheid single result test* Anterior Nasal Swab     Status: None   Collection Time: 03/26/24  2:10 PM   Specimen: Anterior Nasal Swab  Result Value Ref Range Status   SARS Coronavirus 2 by RT PCR NEGATIVE NEGATIVE Final    Comment: (NOTE) SARS-CoV-2 target nucleic acids are NOT DETECTED.  The SARS-CoV-2 RNA is generally detectable in upper and lower respiratory specimens during the acute phase of infection. The lowest concentration  of SARS-CoV-2 viral copies this assay can detect is 250 copies / mL. A negative result does not preclude SARS-CoV-2 infection and should not be used as the sole basis for treatment or other patient management decisions.  A negative result may occur with improper specimen collection / handling, submission of specimen other than nasopharyngeal swab, presence of viral mutation(s) within the areas targeted by this assay, and inadequate number of viral copies (<250 copies / mL). A negative result must be combined with clinical observations, patient history, and epidemiological information.  Fact Sheet for Patients:   roadlaptop.co.za  Fact Sheet for Healthcare Providers: http://kim-miller.com/  This test is not yet approved or  cleared by the [place]  FDA and has been authorized for detection and/or diagnosis of SARS-CoV-2 by FDA  under an Emergency Use Authorization (EUA).  This EUA will remain in effect (meaning this test can be used) for the duration of the COVID-19 declaration under Section 564(b)(1) of the Act, 21 U.S.C. section 360bbb-3(b)(1), unless the authorization is terminated or revoked sooner.  Performed at [facility], [address], [place], [place] [address]          Radiology Studies: No results found.      Scheduled Meds:  amLODipine   5 mg Oral Daily   apixaban   5 mg Oral BID   buPROPion   150 mg Oral Daily   chlorthalidone   25 mg Oral Daily   clonazePAM   0.5 mg Oral Q breakfast   escitalopram   15 mg Oral Daily   furosemide   40 mg Oral Once   hydrocortisone  cream   Topical BID   insulin  aspart  0-15 Units Subcutaneous TID WC   insulin  aspart  0-5 Units Subcutaneous QHS   insulin  aspart  12 Units Subcutaneous TID WC   insulin  glargine-yfgn  22 Units Subcutaneous BID   levothyroxine   75 mcg Oral Q0600   metoprolol  succinate  25 mg Oral Daily   nystatin  cream   Topical BID   pantoprazole   40 mg Oral Daily   rosuvastatin   20 mg Oral QHS   zolpidem   10 mg Oral QHS   Continuous Infusions:   LOS: 7 days     Calvin KATHEE Robson, MD [facility]   If 7PM-7AM, please contact night-coverage  04/02/2024, 3:54 PM

## 2024-04-03 LAB — BASIC METABOLIC PANEL WITH GFR
Anion gap: 10 (ref 5–15)
BUN: 44 mg/dL — ABNORMAL HIGH (ref 8–23)
CO2: 28 mmol/L (ref 22–32)
Calcium: 9 mg/dL (ref 8.9–10.3)
Chloride: 100 mmol/L (ref 98–111)
Creatinine, Ser: 1.31 mg/dL — ABNORMAL HIGH (ref 0.61–1.24)
GFR, Estimated: >60 mL/min (ref >60)
Glucose, Bld: 186 mg/dL — ABNORMAL HIGH (ref 70–99)
Potassium: 4.1 mmol/L (ref 3.5–5.1)
Sodium: 137 mmol/L (ref 135–145)

## 2024-04-03 LAB — GLUCOSE, CAPILLARY
Glucose-Capillary: 115 mg/dL — ABNORMAL HIGH (ref 70–99)
Glucose-Capillary: 180 mg/dL — ABNORMAL HIGH (ref 70–99)
Glucose-Capillary: 65 mg/dL — ABNORMAL LOW (ref 70–99)
Glucose-Capillary: 311 mg/dL — ABNORMAL HIGH (ref 70–99)

## 2024-04-03 MED ORDER — DICYCLOMINE HCL 20 MG PO TABS
20.0000 mg | ORAL_TABLET | Freq: Three times a day (TID) | ORAL | Status: DC
  Administered 2024-04-03 – 2024-04-04 (×3): 20 mg via ORAL
  Filled 2024-04-03 (×4): qty 1

## 2024-04-03 MED ORDER — FUROSEMIDE 20 MG PO TABS
40.0000 mg | ORAL_TABLET | Freq: Once | ORAL | Status: AC
  Administered 2024-04-03: 40 mg via ORAL
  Filled 2024-04-03: qty 2

## 2024-04-03 NOTE — Progress Notes (Signed)
 Behavior:  Pleasant and cooperative.   Psych assessment: Denies SI/HI and AVH.    Interaction / Group attendance:  Present in the milieu.  Minimal interaction with peers and staff.  Attends some groups.   Medication/ PRNs: Compliant.  Pain: Denies.  15 min checks in place for safety.    Patient able to contract for safety on the unit.  Denies SI.  Pt able to have ace bandage without 1:1 sitter per Dr. Jadapalle.

## 2024-04-03 NOTE — Group Note (Signed)
 Date:  04/03/2024 Time:  8:41 PM  Group Topic/Focus:  Making Healthy Choices:   The focus of this group is to help patients identify negative/unhealthy choices they were using prior to admission and identify positive/healthier coping strategies to replace them upon discharge.    Participation Level:  Active  Participation Quality:  Appropriate  Affect:  Appropriate  Cognitive:  Appropriate  Insight: Good  Engagement in Group:  Engaged  Modes of Intervention:  Discussion  Additional Comments:    [name] 04/03/2024, 8:41 PM

## 2024-04-03 NOTE — Evaluation (Signed)
 Physical Therapy Evaluation Patient Details Name: [name] MRN: [id number] DOB: 1960/12/07 Today's Date: 04/03/2024  History of Present Illness  presented to ER and admitted to gero-psych unit secondary to suicidal ideation and major depressive disorder.  PMH significant for L BKA, laryngectomy  Clinical Impression  Patient seated at table (in standard chair) in common area upon arrival to unit.  Alert and oriented, follows commands and agreeable to participation with session.  Transfers self from standard chair to manual WC, mod indep, and indep negotiates manual WC from common area to individual room for session. Session emphasis on L LE residual limb wrapping (per patient and consulting physician request-cleared to have ace wrap in department per consulting/attending physicians).  Discussed limb wrapping/care with patient.  Denies having shrinker at home; unaware of how to apply ace wrap to extremity.  Applied 4 ace wrap to L LE residual limb for edema management, shaping, skin protection; reviewed role of wrapping, technique for wrapping (with relevant components-shape, no holes, no wrinkles).  Patient tolerated well and appreciative of efforts. Discussed with consulting physician--order placed in chart for L LE shrinker (order relayed to [employer] previous date); will follow to faciliate delivery and application, as well as any additional education for patient as needed. Reviewed wrapping with RN (observed application during session); provided handout with written/pictorial descriptions of technique for wrapping for adjustment/rewrapping as needed.  Extra ace wrap left with RN at nursing station should it be needed. Would benefit from skilled PT to address above deficits and promote optimal return to PLOF.; will continue to follow throughout stay to facilitate continued education in wrapping and facilitate delivery of shrinker as appropriate.  Anticipate no formal PT needs upon discharge.       If plan is discharge home, recommend the following:     Can travel by private vehicle        Equipment Recommendations    Recommendations for Other Services       Functional Status Assessment Patient has had a recent decline in their functional status and demonstrates the ability to make significant improvements in function in a reasonable and predictable amount of time.     Precautions / Restrictions Precautions Precautions: None Restrictions Weight Bearing Restrictions Per Provider Order: No      Mobility  Bed Mobility               General bed mobility comments: in manual WC beginning/end of treatment session    Transfers Overall transfer level: Modified independent                 General transfer comment: stand/squat pivot between seating surfaces    Ambulation/Gait               General Gait Details: unable to don prosthesis due to L LE edema  Stairs            Wheelchair Mobility     Tilt Bed    Modified Rankin (Stroke Patients Only)       Balance                                             Pertinent Vitals/Pain Pain Assessment Pain Assessment: No/denies pain    Home Living Family/patient expects to be discharged to:: Private residence  Prior Function Prior Level of Function : Independent/Modified Independent             Mobility Comments: Mod indep with mobility; does have L BKA prosthesis he is able to use when L LE swelling managed       Extremity/Trunk Assessment   Upper Extremity Assessment Upper Extremity Assessment: Overall WFL for tasks assessed    Lower Extremity Assessment Lower Extremity Assessment: Overall WFL for tasks assessed (L BKA)       Communication   Communication Factors Affecting Communication:  (s/p laryngectomy; uses finger occlusion to aid in voice production)    Cognition Arousal: Alert Behavior During Therapy: WFL  for tasks assessed/performed   PT - Cognitive impairments: No apparent impairments                         Following commands: Intact       Cueing Cueing Techniques: Verbal cues     General Comments      Exercises Other Exercises Other Exercises: Session emphasis on L LE residual limb wrapping (per patient and consulting physician request-cleared to have ace wrap in department per consulting/attending physicians).  Discussed limb wrapping/care with patient.  Denies having shrinker at home; unaware of how to apply ace wrap to extremity.  Applied 4 ace wrap to L LE residual limb for edema management, shaping, skin protection; reviewed role of wrapping, technique for wrapping (with relevant components-shape, no holes, no wrinkles).  Patient tolerated well and appreciative of efforts. Other Exercises: Discussed with consulting physician--order placed in chart for L LE shrinker (order relayed to [employer] previous date); will follow to faciliate delivery and application, as well as any additional education for patient as needed. Other Exercises: Reviewed wrapping with RN (observed application during session); provided handout with written/pictorial descriptions of technique for wrapping for adjustment/rewrapping as needed.  Extra ace wrap left with RN at nursing station should it be needed.   Assessment/Plan    PT Assessment Patient needs continued PT services  PT Problem List Decreased knowledge of use of DME;Decreased safety awareness;Decreased knowledge of precautions       PT Treatment Interventions Therapeutic activities;Functional mobility training;Patient/family education    PT Goals (Current goals can be found in the Care Plan section)  Acute Rehab PT Goals Patient Stated Goal: to make this swelling go down so I can put on my prosthesis PT Goal Formulation: With patient Time For Goal Achievement: 04/17/24 Potential to Achieve Goals: Good Additional Goals Additional Goal  #1: Indep with residual limb wrapping and care to allow for safe and functional use of prosthesis (L BKA)    Frequency Min 1X/week     Co-evaluation               AM-PAC PT 6 Clicks Mobility  Outcome Measure Help needed turning from your back to your side while in a flat bed without using bedrails?: None Help needed moving from lying on your back to sitting on the side of a flat bed without using bedrails?: None Help needed moving to and from a bed to a chair (including a wheelchair)?: None Help needed standing up from a chair using your arms (e.g., wheelchair or bedside chair)?: None Help needed to walk in hospital room?: A Little Help needed climbing 3-5 steps with a railing? : A Little 6 Click Score: 22    End of Session   Activity Tolerance: Patient tolerated treatment well Patient left: in chair;with call bell/phone within  reach (in manual WC)   PT Visit Diagnosis: Other (comment)    Time: 8845-8787 PT Time Calculation (min) (ACUTE ONLY): 18 min   Charges:   PT Evaluation $PT Eval Low Complexity: 1 Low   PT General Charges $$ ACUTE PT VISIT: 1 Visit       Aniesha Haughn H. Delores, PT, DPT, NCS 04/03/24, 1:16 PM [phone]

## 2024-04-03 NOTE — Group Note (Signed)
 Recreation Therapy Group Note   Group Topic:Communication  Group Date: 04/03/2024 Start Time: 1210 End Time: 1240 Facilitators: Celestia Jeoffrey FORBES ARTICE, CTRS Location: Craft Room  Group Description: Gratitude Conversations. Patients and LRT discussed what gratitude means, how we can express it and what it means to us , personally. LRT gave an educational handout on the definition of gratitude as well as the benefits that come from it. LRT and patients discussed different gratitude prompts and allowed patients to join in the guided discussion. LRT and pts processed how showing gratitude towards themselves, and others can be applied to everyday life post-discharge. LRT offered journals and folders to pts afterwards.   Goal Area(s) Addressed:  Patient will identify the definition of gratitude. Patient will learn different gratitude exercises. Patient will increase communication.  Patient will identify a new coping skill.   Affect/Mood: Appropriate   Participation Level: Active and Engaged   Participation Quality: Independent   Behavior: Calm and Cooperative   Speech/Thought Process: Coherent   Insight: Good   Judgement: Good   Modes of Intervention: Clarification, Education, Exploration, and Guided Discussion   Patient Response to Interventions:  Attentive, Engaged, and Receptive   Education Outcome:  Acknowledges education   Clinical Observations/Individualized Feedback: [name] was active in their participation of session activities and group discussion. Pt identified that one challenge in his life he is grateful for is overcoming his 51 years of diabetes and learning how to properly manage it along with all of his other physical disabilities.    Plan: Continue to engage patient in RT group sessions 2-3x/week.   Jeoffrey FORBES Celestia, LRT, CTRS 04/03/2024 1:01 PM

## 2024-04-03 NOTE — Group Note (Signed)
 LCSW Group Therapy Note   Group Date: 04/03/2024 Start Time: 1300 End Time: 1330   Type of Therapy and Topic:  Group Therapy: Challenging Core Beliefs  Participation Level:  None  Description of Group:  Patients were educated about core beliefs and asked to identify one harmful core belief that they have. Patients were asked to explore from where those beliefs originate. Patients were asked to discuss how those beliefs make them feel and the resulting behaviors of those beliefs. They were then be asked if those beliefs are true and, if so, what evidence they have to support them. Lastly, group members were challenged to replace those negative core beliefs with helpful beliefs.   Therapeutic Goals:   1. Patient will identify harmful core beliefs and explore the origins of such beliefs. 2. Patient will identify feelings and behaviors that result from those core beliefs. 3. Patient will discuss whether such beliefs are true. 4.  Patient will replace harmful core beliefs with helpful ones.  Summary of Patient Progress:  X  Therapeutic Modalities: Cognitive Behavioral Therapy; Solution-Focused Therapy   Ashaunte Standley D Annalisia Ingber, [place] 04/03/2024  1:30 PM

## 2024-04-03 NOTE — Progress Notes (Signed)
 PROGRESS NOTE    [name]  FMW:[id number] DOB: [birth year] DOA: 03/26/2024 PCP: [name], MD    Brief Narrative:   63 y.o. male with past medical history of type 1 diabetes on insulin  pump, status post laryngectomy and tracheostomy, history of stroke, dyslipidemia, essential hypertension, hypothyroidism, major depression, OCD, who presents to the hospital for treating depression.  Insulin  pump was removed due to concern of suicidal ideation.  Currently glucose running high.   Symptomatically, patient has a depressed mood.  Otherwise no complaint.   11/21.  Sugars very elevated last night and this morning.  Uses insulin  pump at home.  This morning needed to give one-time dose of NovoLog  10 and increase Semglee  insulin  to 20 units twice daily, increased meal coverage to 12 units 3 times daily. 11/22.  Patient states he is having a lot more trouble breathing with his trach.  He is suctioning himself.  He states he is not getting enough food. 11/23.  Patient is getting for some medication for decades.  Complains of some daytime anxiety and concerned about him living alone and cooking at home.  Asked nursing staff to cut one of the paper down pant leg so he can get his prosthesis on. 11/24.  Patient complains of daytime anxiety.  Complained of jock itch and wanted a cream. 11/25.  Patient complains of fatigue even though he slept a full 8 hours. 11/26: Patient seen and examined.  Overall improved.  Glycemic control improved.  Main complaint is swelling of the left stump and inability for prosthesis to be appropriately placed  Assessment & Plan:   Principal Problem:   Uncontrolled type 1 diabetes mellitus with hyperglycemia, with long-term current use of insulin  (HCC) Active Problems:   Tracheostomy in place (HCC)   Elevated serum creatinine   HTN (hypertension)   Acquired hypothyroidism   Hyperlipidemia, unspecified   MDD (major depressive disorder), recurrent severe, without  psychosis (HCC)   Obesity (BMI 30-39.9)   History of DVT (deep vein thrombosis)  * Uncontrolled type 1 diabetes mellitus with hyperglycemia, with long-term current use of insulin  (HCC) Improved glycemic control.  Continue Semglee  22 units twice daily.  Continue NovoLog  12 units 3 times daily with meals.  Continue sliding scale coverage.  Plan: Continue basal bolus regimen while admitted Resume home insulin  pump on discharge   Tracheostomy in place [facility]) Patient having transmitted wheeze.  Chest x-ray negative.  Low suspicion for infection.  Hold on antibiotics.    Elevated serum creatinine Creatinine peaked at 1.46.  Subsequently downtrending.  Approximately back at baseline.  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  Continue hydrochlorothiazide .  Hold Avapro  for now.  Can resume at discharge.  Lasix  40 mg p.o. x 1 on 11/27    HTN (hypertension) Patient on Norvasc , and Toprol .   Restart hydrochlorothiazide  11/26 Lasix  40 mg p.o. x 1   Acquired hypothyroidism TS H within normal limits.  Continue Synthroid  75 mcg daily   History of DVT (deep vein thrombosis) On Eliquis    Obesity (BMI 30-39.9) BMI 33.77   MDD (major depressive disorder), recurrent severe, without psychosis (HCC) Patient on Lexapro , Zyprexa , Wellbutrin , Seroquel  and clonazepam  as per psychiatry.   Hyperlipidemia, unspecified Continue Crestor .  CK normal limits.  No evidence of rhabdomyolysis.   DVT prophylaxis: Per primary Code Status: Full Family Communication: None Disposition Plan: Per primary   Level of care:   Consultants:  Hospitalist  Procedures:  None  Antimicrobials: None  Subjective: Seen and examined.  Sitting in chair.  Stump shrinker in place  Objective: Vitals:   04/02/24 1804 04/02/24 1925 04/02/24 2045 04/03/24 0751  BP: 133/64 118/67  124/69  Pulse: 69 65 62 65  Resp:   16   Temp:  (!) 97.3 F (36.3 C)  (!) 97.2 F (36.2 C)  TempSrc:       SpO2: 97% 95%  97%  Weight:      Height:       No intake or output data in the 24 hours ending 04/03/24 1502 Filed Weights   03/26/24 2321  Weight: 112.9 kg    Examination:  General exam: NAD.  Sitting up in wheelchair Respiratory system: Tracheostomy, transmitted breath sounds.  Lungs clear.  Room air Cardiovascular system: S1-S2, RRR, no murmurs, no pedal edema Gastrointestinal system: BS, soft, NT/ND, normal bowel sounds Central nervous system: Alert and oriented. No focal neurological deficits. Extremities: Left lower extremity BKA, stump shrinker in place Skin: No rashes, lesions or ulcers Psychiatry: Judgement and insight appear normal. Mood & affect appropriate.     Data Reviewed: I have personally reviewed following labs and imaging studies  CBC: Recent Labs  Lab 03/30/24 1027 04/02/24 0727  WBC 5.2 5.6  HGB 14.1 14.2  HCT 41.8 42.9  MCV 88.7 89.6  PLT 269 262   Basic Metabolic Panel: Recent Labs  Lab 03/30/24 1027 04/02/24 0727 04/03/24 0755  NA 135 140 137  K 4.2 4.1 4.1  CL 99 103 100  CO2 26 28 28   GLUCOSE 273* 107* 186*  BUN 42* 41* 44*  CREATININE 1.46* 1.25* 1.31*  CALCIUM  9.2 9.1 9.0   GFR: Estimated Creatinine Clearance: 76.8 mL/min (A) (by C-G formula based on SCr of 1.31 mg/dL (H)). Liver Function Tests: No results for input(s): AST, ALT, ALKPHOS, BILITOT, PROT, ALBUMIN in the last 168 hours. No results for input(s): LIPASE, AMYLASE in the last 168 hours. No results for input(s): AMMONIA in the last 168 hours. Coagulation Profile: No results for input(s): INR, PROTIME in the last 168 hours. Cardiac Enzymes: Recent Labs  Lab 04/02/24 0727  CKTOTAL 51   BNP (last 3 results) No results for input(s): PROBNP in the last 8760 hours. HbA1C: No results for input(s): HGBA1C in the last 72 hours. CBG: Recent Labs  Lab 04/02/24 1135 04/02/24 1615 04/02/24 2008 04/03/24 0730 04/03/24 1112  GLUCAP 131* 112*  116* 115* 180*   Lipid Profile: No results for input(s): CHOL, HDL, LDLCALC, TRIG, CHOLHDL, LDLDIRECT in the last 72 hours. Thyroid  Function Tests: Recent Labs    04/02/24 0727  TSH 2.800   Anemia Panel: No results for input(s): VITAMINB12, FOLATE, FERRITIN, TIBC, IRON, RETICCTPCT in the last 72 hours. Sepsis Labs: No results for input(s): PROCALCITON, LATICACIDVEN in the last 168 hours.  Recent Results (from the past 240 hours)  SARS Coronavirus 2 by RT PCR (hospital order, performed in [facility] hospital lab) *cepheid single result test* Anterior Nasal Swab     Status: None   Collection Time: 03/26/24  2:10 PM   Specimen: Anterior Nasal Swab  Result Value Ref Range Status   SARS Coronavirus 2 by RT PCR NEGATIVE NEGATIVE Final    Comment: (NOTE) SARS-CoV-2 target nucleic acids are NOT DETECTED.  The SARS-CoV-2 RNA is generally detectable in upper and lower respiratory specimens during the acute phase of infection. The lowest concentration of SARS-CoV-2 viral copies this assay can detect is 250 copies / mL. A negative result does not preclude  SARS-CoV-2 infection and should not be used as the sole basis for treatment or other patient management decisions.  A negative result may occur with improper specimen collection / handling, submission of specimen other than nasopharyngeal swab, presence of viral mutation(s) within the areas targeted by this assay, and inadequate number of viral copies (<250 copies / mL). A negative result must be combined with clinical observations, patient history, and epidemiological information.  Fact Sheet for Patients:   roadlaptop.co.za  Fact Sheet for Healthcare Providers: http://kim-miller.com/  This test is not yet approved or  cleared by the [place]  FDA and has been authorized for detection and/or diagnosis of SARS-CoV-2 by FDA under an Emergency Use  Authorization (EUA).  This EUA will remain in effect (meaning this test can be used) for the duration of the COVID-19 declaration under Section 564(b)(1) of the Act, 21 U.S.C. section 360bbb-3(b)(1), unless the authorization is terminated or revoked sooner.  Performed at [facility], [address] [place], [place] [address]          Radiology Studies: No results found.      Scheduled Meds:  amLODipine   5 mg Oral Daily   apixaban   5 mg Oral BID   buPROPion   150 mg Oral Daily   chlorthalidone   25 mg Oral Daily   clonazePAM   0.5 mg Oral Q breakfast   dicyclomine   20 mg Oral TID AC   escitalopram   15 mg Oral Daily   furosemide   40 mg Oral Once   hydrocortisone  cream   Topical BID   insulin  aspart  0-15 Units Subcutaneous TID WC   insulin  aspart  0-5 Units Subcutaneous QHS   insulin  aspart  12 Units Subcutaneous TID WC   insulin  glargine-yfgn  22 Units Subcutaneous BID   levothyroxine   75 mcg Oral Q0600   metoprolol  succinate  25 mg Oral Daily   nystatin  cream   Topical BID   pantoprazole   40 mg Oral Daily   rosuvastatin   20 mg Oral QHS   zolpidem   10 mg Oral QHS   Continuous Infusions:   LOS: 8 days     Calvin KATHEE Robson, MD [facility]   If 7PM-7AM, please contact night-coverage  04/03/2024, 3:02 PM

## 2024-04-03 NOTE — Plan of Care (Signed)
  Problem: Education: Goal: Utilization of techniques to improve thought processes will improve Outcome: Progressing Goal: Knowledge of the prescribed therapeutic regimen will improve Outcome: Progressing   Problem: Coping: Goal: Coping ability will improve Outcome: Progressing Goal: Will verbalize feelings Outcome: Progressing   Problem: Coping: Goal: Coping ability will improve Outcome: Progressing   Problem: Self-Concept: Goal: Ability to disclose and discuss suicidal ideas will improve Outcome: Progressing Goal: Will verbalize positive feelings about self Outcome: Progressing Note: Patient is on track. Patient will maintain adherence    Problem: Education: Goal: Knowledge about tracheostomy care/management will improve Outcome: Progressing   Problem: Activity: Goal: Ability to tolerate increased activity will improve Outcome: Progressing

## 2024-04-03 NOTE — Progress Notes (Signed)
 [facility] MD Progress Note  04/03/2024 1:41 PM [name]  MRN:  [id number] [name] is a 63 y.o. male admitted: Presented to the ED for 03/25/2024  5:37 PM for for worsening depression and active suicidal ideation in the setting of functional decline and multiple failed attempts to be evaluated during recent ED visits. He carries the psychiatric diagnoses of Major Depressive Disorder, recurrent severe, and has a past medical history of diabetes mellitus, bilateral below-knee amputations, tracheostomy, and difficulty adjusting to chronic medical disability. Patient is admitted to [facility] unit with Q15 min safety monitoring. Multidisciplinary team approach is offered. Medication management; group/milieu therapy is offered.  Subjective:  Chart reviewed, case discussed in multidisciplinary meeting, patient seen during rounds.   Patient is noted to be resting in bed.  He was able to get up to talk with the provider.  He offers no complaints.  He continues to report improvement in his depression and anxiety since adjustments of Klonopin  and Effexor .  He denies SI/HI/plan and has been maintaining safe behaviors on the unit.  Patient was updated on the latest recommendations of the hospitalist and vascular surgeon about doing Ace bandage on the stump and use of Lasix .  Patient is educated on his discharge planning for tomorrow.  He offers no complaints or questions.  He reports tolerating medications very well.  He denies auditory/visual hallucinations.   Past Psychiatric History: see h&P Family History:  Family History  Problem Relation Age of Onset   Osteoporosis Mother    Diabetes Mother    Hypertension Father    Mental illness Neg Hx    Social History:  Social History   Substance and Sexual Activity  Alcohol Use Yes   Alcohol/week: 2.0 standard drinks of alcohol   Types: 2 Shots of liquor per week     Social History   Substance and Sexual Activity  Drug Use No   Comment: Pt denied;  UDS not available    Social History   Socioeconomic History   Marital status: Widowed    Spouse name: [name]   Number of children: Not on file   Years of education: Not on file   Highest education level: Not on file  Occupational History   Not on file  Tobacco Use   Smoking status: Former    Current packs/day: 0.00    Types: Cigarettes    Quit date: 04/09/2013    Years since quitting: 10.9   Smokeless tobacco: Never  Vaping Use   Vaping status: Never Used  Substance and Sexual Activity   Alcohol use: Yes    Alcohol/week: 2.0 standard drinks of alcohol    Types: 2 Shots of liquor per week   Drug use: No    Comment: Pt denied; UDS not available   Sexual activity: Yes    Partners: Female    Birth control/protection: Condom  Other Topics Concern   Not on file  Social History Narrative   Not on file   Social Drivers of Health   Financial Resource Strain: Low Risk  (12/19/2023)   Received from [facility]   Overall Financial Resource Strain (CARDIA)    Difficulty of Paying Living Expenses: Not hard at all  Food Insecurity: No Food Insecurity (03/26/2024)   Hunger Vital Sign    Worried About Running Out of Food in the Last Year: Never true    Ran Out of Food in the Last Year: Never true  Transportation Needs: No Transportation Needs (03/26/2024)  PRAPARE - Administrator, Civil Service (Medical): No    Lack of Transportation (Non-Medical): No  Physical Activity: Sufficiently Active (07/12/2023)   Exercise Vital Sign    Days of Exercise per Week: 7 days    Minutes of Exercise per Session: 30 min  Stress: Stress Concern Present (07/12/2023)   [employer] of Occupational Health - Occupational Stress Questionnaire    Feeling of Stress : Very much  Social Connections: Socially Isolated (07/12/2023)   Social Connection and Isolation Panel    Frequency of Communication with Friends and Family: More than three times a week    Frequency of Social  Gatherings with Friends and Family: Never    Attends Religious Services: Never    Database Administrator or Organizations: No    Attends Banker Meetings: Never    Marital Status: Widowed   Past Medical History:  Past Medical History:  Diagnosis Date   Depression    Diabetes (HCC)    Insulin  Pump   Diabetes mellitus type I (HCC)    Diabetes mellitus without complication (HCC)    GERD (gastroesophageal reflux disease)    H/O laryngectomy    Heel bone fracture    History of embolic stroke    Hyperlipidemia    Hypertension    Radicular pain of right lower extremity    Stroke (HCC)    Suicide attempt (HCC) 2014   damaged larynx - tracheostomy   Thyroid  disease     Past Surgical History:  Procedure Laterality Date   COLONOSCOPY WITH PROPOFOL  N/A 05/15/2018   Procedure: COLONOSCOPY WITH PROPOFOL ;  Surgeon: [name], Ladell POUR, MD;  Location: [facility] ENDOSCOPY;  Service: Gastroenterology;  Laterality: N/A;   ESOPHAGOGASTRODUODENOSCOPY N/A 05/15/2018   Procedure: ESOPHAGOGASTRODUODENOSCOPY (EGD);  Surgeon: [name], Ladell POUR, MD;  Location: [facility] ENDOSCOPY;  Service: Gastroenterology;  Laterality: N/A;   FRACTURE SURGERY     Heel bone reconstruction Left    HERNIA REPAIR  02/2011   Umbilical hernia repair    LARYNGECTOMY     LOWER EXTREMITY ANGIOGRAPHY Left 01/31/2021   Procedure: LOWER EXTREMITY ANGIOGRAPHY;  Surgeon: Marea Selinda RAMAN, MD;  Location: [facility] INVASIVE CV LAB;  Service: Cardiovascular;  Laterality: Left;   LOWER EXTREMITY ANGIOGRAPHY Right 02/14/2021   Procedure: LOWER EXTREMITY ANGIOGRAPHY;  Surgeon: Marea Selinda RAMAN, MD;  Location: [facility] INVASIVE CV LAB;  Service: Cardiovascular;  Laterality: Right;   NECK SURGERY     fusion   SPINE SURGERY     TRACHEOSTOMY  2014   from SI attempt    Current Medications: Current Facility-Administered Medications  Medication Dose Route Frequency Provider Last Rate Last Admin   acetaminophen  (TYLENOL ) tablet 650 mg  650 mg Oral Q6H PRN  Smith, Annie B, NP   650 mg at 04/01/24 1937   alum & mag hydroxide-simeth (MAALOX/MYLANTA) 200-200-20 MG/5ML suspension 30 mL  30 mL Oral Q4H PRN Smith, Annie B, NP       amLODipine  (NORVASC ) tablet 5 mg  5 mg Oral Daily Smith, Annie B, NP   5 mg at 04/03/24 9041   apixaban  (ELIQUIS ) tablet 5 mg  5 mg Oral BID Smith, Annie B, NP   5 mg at 04/03/24 9041   buPROPion  (WELLBUTRIN  XL) 24 hr tablet 150 mg  150 mg Oral Daily Nohemy Koop, MD   150 mg at 04/03/24 9041   chlorthalidone  (HYGROTON ) tablet 25 mg  25 mg Oral Daily Sreenath, Sudheer B, MD   25 mg at 04/03/24 [phone]  clonazePAM  (KLONOPIN ) tablet 0.5 mg  0.5 mg Oral Q breakfast Eliab Closson, MD   0.5 mg at 04/03/24 0957   clonazePAM  (KLONOPIN ) tablet 1 mg  1 mg Oral Daily PRN Smith, Annie B, NP   1 mg at 04/02/24 2132   diphenhydrAMINE -zinc  acetate (BENADRYL ) 2-0.1 % cream   Topical TID PRN Canesha Tesfaye, MD   Given at 03/30/24 1259   escitalopram  (LEXAPRO ) tablet 15 mg  15 mg Oral Daily Willa Brocks, MD   15 mg at 04/03/24 9041   hydrocortisone  cream 1 %   Topical BID Josette Ade, MD   1 Application at 04/03/24 1003   insulin  aspart (novoLOG ) injection 0-15 Units  0-15 Units Subcutaneous TID WC Smith, Annie B, NP   3 Units at 04/03/24 1240   insulin  aspart (novoLOG ) injection 0-5 Units  0-5 Units Subcutaneous QHS Smith, Annie B, NP   2 Units at 03/31/24 2139   insulin  aspart (novoLOG ) injection 12 Units  12 Units Subcutaneous TID WC Josette Ade, MD   12 Units at 04/03/24 1240   insulin  glargine-yfgn (SEMGLEE ) injection 22 Units  22 Units Subcutaneous BID Josette Ade, MD   22 Units at 04/03/24 1115   levothyroxine  (SYNTHROID ) tablet 75 mcg  75 mcg Oral Q0600 Smith, Annie B, NP   75 mcg at 04/03/24 9470   loperamide  (IMODIUM ) capsule 4 mg  4 mg Oral PRN Vanya Carberry, MD   4 mg at 04/02/24 9046   magnesium  hydroxide (MILK OF MAGNESIA) suspension 30 mL  30 mL Oral Daily PRN Smith, Annie B, NP       metoprolol  succinate  (TOPROL -XL) 24 hr tablet 25 mg  25 mg Oral Daily Smith, Annie B, NP   25 mg at 04/03/24 9041   nystatin  cream (MYCOSTATIN )   Topical BID Josette Ade, MD   Given at 04/02/24 2127   OLANZapine  (ZYPREXA ) injection 5 mg  5 mg Intramuscular TID PRN Smith, Annie B, NP       OLANZapine  zydis (ZYPREXA ) disintegrating tablet 5 mg  5 mg Oral TID PRN Smith, Annie B, NP       ondansetron  (ZOFRAN -ODT) disintegrating tablet 4 mg  4 mg Oral Q8H PRN Smith, Annie B, NP       pantoprazole  (PROTONIX ) EC tablet 40 mg  40 mg Oral Daily Smith, Annie B, NP   40 mg at 04/03/24 9043   rosuvastatin  (CRESTOR ) tablet 20 mg  20 mg Oral QHS Smith, Annie B, NP   20 mg at 04/02/24 2127   zolpidem  (AMBIEN ) tablet 10 mg  10 mg Oral QHS Douglas Rooks, MD   10 mg at 04/02/24 2127    Lab Results:  Results for orders placed or performed during the hospital encounter of 03/26/24 (from the past 48 hours)  Glucose, capillary     Status: Abnormal   Collection Time: 04/01/24  4:18 PM  Result Value Ref Range   Glucose-Capillary 190 (H) 70 - 99 mg/dL    Comment: Glucose reference range applies only to samples taken after fasting for at least 8 hours.  Glucose, capillary     Status: Abnormal   Collection Time: 04/01/24  7:36 PM  Result Value Ref Range   Glucose-Capillary 128 (H) 70 - 99 mg/dL    Comment: Glucose reference range applies only to samples taken after fasting for at least 8 hours.  CBC     Status: None   Collection Time: 04/02/24  7:27 AM  Result Value Ref Range  WBC 5.6 4.0 - 10.5 K/uL   RBC 4.79 4.22 - 5.81 MIL/uL   Hemoglobin 14.2 13.0 - 17.0 g/dL   HCT 57.0 60.9 - 47.9 %   MCV 89.6 80.0 - 100.0 fL   MCH 29.6 26.0 - 34.0 pg   MCHC 33.1 30.0 - 36.0 g/dL   RDW 86.9 88.4 - 84.4 %   Platelets 262 150 - 400 K/uL   nRBC 0.0 0.0 - 0.2 %    Comment: Performed at [facility], [address] [place], [place] [address]  Basic metabolic panel     Status: Abnormal   Collection Time: 04/02/24  7:27 AM   Result Value Ref Range   Sodium 140 135 - 145 mmol/L   Potassium 4.1 3.5 - 5.1 mmol/L   Chloride 103 98 - 111 mmol/L   CO2 28 22 - 32 mmol/L   Glucose, Bld 107 (H) 70 - 99 mg/dL    Comment: Glucose reference range applies only to samples taken after fasting for at least 8 hours.   BUN 41 (H) 8 - 23 mg/dL   Creatinine, Ser 8.74 (H) 0.61 - 1.24 mg/dL   Calcium  9.1 8.9 - 10.3 mg/dL   GFR, Estimated >39 >39 mL/min    Comment: (NOTE) Calculated using the CKD-EPI Creatinine Equation (2021)    Anion gap 9 5 - 15    Comment: Performed at [facility], [address] [place], [place] [address]  TSH     Status: None   Collection Time: 04/02/24  7:27 AM  Result Value Ref Range   TSH 2.800 0.350 - 4.500 uIU/mL    Comment: Performed at [facility], [address] [place], [place] [address]  CK     Status: None   Collection Time: 04/02/24  7:27 AM  Result Value Ref Range   Total CK 51 49 - 397 U/L    Comment: Performed at [facility], [address] [place], [place] [address]  Glucose, capillary     Status: Abnormal   Collection Time: 04/02/24  7:36 AM  Result Value Ref Range   Glucose-Capillary 125 (H) 70 - 99 mg/dL    Comment: Glucose reference range applies only to samples taken after fasting for at least 8 hours.  Glucose, capillary     Status: Abnormal   Collection Time: 04/02/24 11:35 AM  Result Value Ref Range   Glucose-Capillary 131 (H) 70 - 99 mg/dL    Comment: Glucose reference range applies only to samples taken after fasting for at least 8 hours.  Glucose, capillary     Status: Abnormal   Collection Time: 04/02/24  4:15 PM  Result Value Ref Range   Glucose-Capillary 112 (H) 70 - 99 mg/dL    Comment: Glucose reference range applies only to samples taken after fasting for at least 8 hours.  Glucose, capillary     Status: Abnormal   Collection Time: 04/02/24  8:08 PM  Result Value Ref Range   Glucose-Capillary 116 (H) 70 - 99 mg/dL     Comment: Glucose reference range applies only to samples taken after fasting for at least 8 hours.  Glucose, capillary     Status: Abnormal   Collection Time: 04/03/24  7:30 AM  Result Value Ref Range   Glucose-Capillary 115 (H) 70 - 99 mg/dL    Comment: Glucose reference range applies only to samples taken after fasting for at least 8 hours.  Basic metabolic panel with GFR  Status: Abnormal   Collection Time: 04/03/24  7:55 AM  Result Value Ref Range   Sodium 137 135 - 145 mmol/L   Potassium 4.1 3.5 - 5.1 mmol/L   Chloride 100 98 - 111 mmol/L   CO2 28 22 - 32 mmol/L   Glucose, Bld 186 (H) 70 - 99 mg/dL    Comment: Glucose reference range applies only to samples taken after fasting for at least 8 hours.   BUN 44 (H) 8 - 23 mg/dL   Creatinine, Ser 8.68 (H) 0.61 - 1.24 mg/dL   Calcium  9.0 8.9 - 10.3 mg/dL   GFR, Estimated >39 >39 mL/min    Comment: (NOTE) Calculated using the CKD-EPI Creatinine Equation (2021)    Anion gap 10 5 - 15    Comment: Performed at [facility], [address] [place], [place] [address]  Glucose, capillary     Status: Abnormal   Collection Time: 04/03/24 11:12 AM  Result Value Ref Range   Glucose-Capillary 180 (H) 70 - 99 mg/dL    Comment: Glucose reference range applies only to samples taken after fasting for at least 8 hours.    Blood Alcohol level:  Lab Results  Component Value Date   [facility] <15 03/25/2024   ETH <10 01/21/2019    Metabolic Disorder Labs: Lab Results  Component Value Date   HGBA1C 7.8 (H) 03/25/2024   MPG 177.16 03/25/2024   MPG 165.68 10/28/2022   No results found for: PROLACTIN Lab Results  Component Value Date   CHOL 161 09/15/2016   TRIG 190 (H) 09/15/2016   HDL 41 09/15/2016   CHOLHDL 3.9 09/15/2016   VLDL 38 09/15/2016   LDLCALC 82 09/15/2016    Physical Findings: AIMS:  , ,  ,  ,    CIWA:    COWS:      Psychiatric Specialty Exam:  Presentation  General Appearance:  Casual  Eye  Contact: Fleeting  Speech: Slow  Speech Volume: Decreased    Mood and Affect  Mood: fine  Affect: stable   Thought Process  Thought Processes: Coherent  Orientation:Full (Time, Place and Person)  Thought Content:Logical  Hallucinations:denies  Ideas of Reference:None  Suicidal Thoughts:denies  Homicidal Thoughts:denies   Sensorium  Memory: Immediate Fair; Remote Fair  Judgment: Improving  Insight: Improving   Executive Functions  Concentration: Fair Attention Span: Fair Recall: [employer] of Knowledge: Fair  Language: Fair   Psychomotor Activity  Psychomotor Activity: No data recorded  Musculoskeletal: Strength & Muscle Tone: decreased Gait & Station: unsteady Assets  Assets: Manufacturing Systems Engineer; Desire for Improvement    Physical Exam: Physical Exam Vitals and nursing note reviewed.    ROS Blood pressure 124/69, pulse 65, temperature (!) 97.2 F (36.2 C), resp. rate 16, height 6' (1.829 m), weight 112.9 kg, SpO2 97%. Body mass index is 33.77 kg/m.  Diagnosis: MDD recurrent severe without psychosis   Principal Problem:   Uncontrolled type 1 diabetes mellitus with hyperglycemia, with long-term current use of insulin  (HCC) Active Problems:   Acquired hypothyroidism   HTN (hypertension)   Tracheostomy in place (HCC)   Hyperlipidemia, unspecified   MDD (major depressive disorder), recurrent severe, without psychosis (HCC)   Obesity (BMI 30-39.9)   History of DVT (deep vein thrombosis)   Elevated serum creatinine   Clinical Decision Making: Patient currently admitted with worsening depression and passive suicidal ideation in the context of multiple medical problems.  Patient will be monitored closely for safety  Treatment Plan Summary:  Safety and Monitoring:             -- Voluntary admission to inpatient psychiatric unit for safety, stabilization and treatment             -- Daily contact with patient to assess  and evaluate symptoms and progress in treatment             -- Patient's case to be discussed in multi-disciplinary team meeting             -- Observation Level: q15 minute checks             -- Vital signs:  q12 hours             -- Precautions: suicide, elopement, and assault   2. Psychiatric Diagnoses and Treatment:             Worsening anxiety- keep   Wellbutrin  XL 150 mg  Klonopin   0.5 mg qam and 1mg  daily PRN Lexapro   15 mg  Patient has poorly controlled diabetes- discontinue Zyprexa  add Seroquel  50 mg at bedtime to help with anxiety/insomnia Continue Ambien  10 mg nightly for insomnia  Wound care recommended Ace bandage to the amputation stump to reduce the fluid collection.  Patient maintains her behaviors on the unit since the day of admission and has been consistently denying SI/HI.  Patient remains future oriented and is participating in treatment plan.  No indication for one-to-one sitter at this time.  Will continue to monitor safety. -- The risks/benefits/side-effects/alternatives to this medication were discussed in detail with the patient and time was given for questions. The patient consents to medication trial.                -- Metabolic profile and EKG monitoring obtained while on an atypical antipsychotic (BMI: Lipid Panel: HbgA1c: QTc:)              -- Encouraged patient to participate in unit milieu and in scheduled group therapies  3. Medical Issues Being Addressed:   Hospitalist and diabetic educator consult placed: * Uncontrolled type 1 diabetes mellitus with hyperglycemia, with long-term current use of insulin  (HCC) High sugars last night and again this morning.  1 dose of NovoLog  10 given.  Increased Semglee  insulin  to 20 units twice daily.  Increase meal coverage to 12 units 3 times daily plus sliding scale.   HTN (hypertension) Patient on Norvasc , chlorthalidone , irbesartan  and Toprol    Acquired hypothyroidism On levothyroxine    History of DVT (deep vein  thrombosis) On Eliquis  4. Discharge Planning:   -- Social work and case management to assist with discharge planning and identification of hospital follow-up needs prior to discharge  -- Estimated LOS: 3-4 days  Allyn Foil, MD 04/03/2024, 1:41 PM

## 2024-04-03 NOTE — BH IP Treatment Plan (Signed)
 Interdisciplinary Treatment and Diagnostic Plan Update  04/03/2024 Time of Session: 10:30 AM  [name] MRN: [id number]  Principal Diagnosis: Uncontrolled type 1 diabetes mellitus with hyperglycemia, with long-term current use of insulin  (HCC)  Secondary Diagnoses: Principal Problem:   Uncontrolled type 1 diabetes mellitus with hyperglycemia, with long-term current use of insulin  (HCC) Active Problems:   Acquired hypothyroidism   HTN (hypertension)   Tracheostomy in place (HCC)   Hyperlipidemia, unspecified   MDD (major depressive disorder), recurrent severe, without psychosis (HCC)   Obesity (BMI 30-39.9)   History of DVT (deep vein thrombosis)   Elevated serum creatinine   Current Medications:  Current Facility-Administered Medications  Medication Dose Route Frequency Provider Last Rate Last Admin   acetaminophen  (TYLENOL ) tablet 650 mg  650 mg Oral Q6H PRN Smith, Annie B, NP   650 mg at 04/01/24 1937   alum & mag hydroxide-simeth (MAALOX/MYLANTA) 200-200-20 MG/5ML suspension 30 mL  30 mL Oral Q4H PRN Smith, Annie B, NP       amLODipine  (NORVASC ) tablet 5 mg  5 mg Oral Daily Smith, Annie B, NP   5 mg at 04/03/24 9041   apixaban  (ELIQUIS ) tablet 5 mg  5 mg Oral BID Smith, Annie B, NP   5 mg at 04/03/24 9041   buPROPion  (WELLBUTRIN  XL) 24 hr tablet 150 mg  150 mg Oral Daily Jadapalle, Sree, MD   150 mg at 04/03/24 9041   chlorthalidone  (HYGROTON ) tablet 25 mg  25 mg Oral Daily Sreenath, Sudheer B, MD   25 mg at 04/03/24 0957   clonazePAM  (KLONOPIN ) tablet 0.5 mg  0.5 mg Oral Q breakfast Jadapalle, Sree, MD   0.5 mg at 04/03/24 0957   clonazePAM  (KLONOPIN ) tablet 1 mg  1 mg Oral Daily PRN Smith, Annie B, NP   1 mg at 04/02/24 2132   diphenhydrAMINE -zinc  acetate (BENADRYL ) 2-0.1 % cream   Topical TID PRN Jadapalle, Sree, MD   Given at 03/30/24 1259   escitalopram  (LEXAPRO ) tablet 15 mg  15 mg Oral Daily Jadapalle, Sree, MD   15 mg at 04/03/24 9041   hydrocortisone  cream 1 %    Topical BID Josette Ade, MD   1 Application at 04/03/24 1003   insulin  aspart (novoLOG ) injection 0-15 Units  0-15 Units Subcutaneous TID WC Smith, Annie B, NP   2 Units at 04/02/24 1240   insulin  aspart (novoLOG ) injection 0-5 Units  0-5 Units Subcutaneous QHS Smith, Annie B, NP   2 Units at 03/31/24 2139   insulin  aspart (novoLOG ) injection 12 Units  12 Units Subcutaneous TID WC Josette Ade, MD   12 Units at 04/03/24 0830   insulin  glargine-yfgn (SEMGLEE ) injection 22 Units  22 Units Subcutaneous BID Josette Ade, MD   22 Units at 04/02/24 2126   levothyroxine  (SYNTHROID ) tablet 75 mcg  75 mcg Oral Q0600 Smith, Annie B, NP   75 mcg at 04/03/24 0529   loperamide  (IMODIUM ) capsule 4 mg  4 mg Oral PRN Jadapalle, Sree, MD   4 mg at 04/02/24 9046   magnesium  hydroxide (MILK OF MAGNESIA) suspension 30 mL  30 mL Oral Daily PRN Smith, Annie B, NP       metoprolol  succinate (TOPROL -XL) 24 hr tablet 25 mg  25 mg Oral Daily Smith, Annie B, NP   25 mg at 04/03/24 9041   nystatin  cream (MYCOSTATIN )   Topical BID Josette Ade, MD   Given at 04/02/24 2127   OLANZapine  (ZYPREXA ) injection 5 mg  5 mg Intramuscular  TID PRN Smith, Annie B, NP       OLANZapine  zydis (ZYPREXA ) disintegrating tablet 5 mg  5 mg Oral TID PRN Smith, Annie B, NP       ondansetron  (ZOFRAN -ODT) disintegrating tablet 4 mg  4 mg Oral Q8H PRN Smith, Annie B, NP       pantoprazole  (PROTONIX ) EC tablet 40 mg  40 mg Oral Daily Smith, Annie B, NP   40 mg at 04/03/24 9043   rosuvastatin  (CRESTOR ) tablet 20 mg  20 mg Oral QHS Smith, Annie B, NP   20 mg at 04/02/24 2126/04/13   zolpidem  (AMBIEN ) tablet 10 mg  10 mg Oral QHS Jadapalle, Sree, MD   10 mg at 04/02/24 April 13, 2126   PTA Medications: Medications Prior to Admission  Medication Sig Dispense Refill Last Dose/Taking   amLODipine  (NORVASC ) 5 MG tablet Take 1 tablet (5 mg total) by mouth daily. 30 tablet 1    apixaban  (ELIQUIS ) 5 MG TABS tablet Take 1 tablet (5 mg total) by mouth 2 (two)  times daily. 60 tablet 5    buPROPion  (WELLBUTRIN  XL) 300 MG 24 hr tablet TAKE 1 TABLET(300 MG) BY MOUTH DAILY WITH BREAKFAST 90 tablet 1    chlorthalidone  (HYGROTON ) 25 MG tablet Take 25 mg by mouth daily.      clindamycin-benzoyl peroxide (BENZACLIN) gel Apply 1 Application topically 2 (two) times daily.      clonazePAM  (KLONOPIN ) 1 MG tablet Take 1 tablet (1 mg total) by mouth daily as needed for anxiety. Please limit use 30 tablet 1    Continuous Glucose Sensor (DEXCOM G7 SENSOR) MISC Use 1 each every 10 (ten) days      escitalopram  (LEXAPRO ) 10 MG tablet Take 10 mg by mouth daily.      HUMALOG 100 UNIT/ML injection Inject 20-120 Units into the skin daily. Uses with Insulin  Pump      LANTUS  SOLOSTAR 100 UNIT/ML Solostar Pen Inject 20 Units into the skin at bedtime. (Patient not taking: Reported on 03/25/2024)      levothyroxine  (SYNTHROID ) 75 MCG tablet TAKE 1 TABLET BY MOUTH DAILY AT 6 AM 90 tablet 0    metoprolol  succinate (TOPROL -XL) 25 MG 24 hr tablet TAKE 1 TABLET(25 MG) BY MOUTH EVERY DAY      mometasone  (ELOCON ) 0.1 % cream Apply to rash on arms and chest twice daily until improved. 45 g 1    OLANZapine  (ZYPREXA ) 10 MG tablet Take 1 tablet (10 mg total) by mouth at bedtime. Has supplies      ondansetron  (ZOFRAN -ODT) 4 MG disintegrating tablet Take 4 mg by mouth every 8 (eight) hours as needed for nausea or vomiting. (Patient not taking: Reported on 03/25/2024)      pantoprazole  (PROTONIX ) 40 MG tablet TAKE 1 TABLET(40 MG) BY MOUTH TWICE DAILY      rosuvastatin  (CRESTOR ) 20 MG tablet Take 20 mg by mouth at bedtime.      sodium hypochlorite (DAKIN'S 1/4 STRENGTH) 0.125 % SOLN  (Patient not taking: Reported on 03/25/2024)      telmisartan (MICARDIS) 80 MG tablet Take 80 mg by mouth daily.      zaleplon  (SONATA ) 10 MG capsule Take 1 capsule (10 mg total) by mouth at bedtime as needed for sleep. Stop Zolpidem  ( Ambien ) 30 capsule 3     Patient Stressors: Health problems   Other: death of  wife in Apr 13, 2021, and family strain between patient and his four children.     Patient Strengths: Motivation for treatment/growth  Supportive  family/friends   Treatment Modalities: Medication Management, Group therapy, Case management,  1 to 1 session with clinician, Psychoeducation, Recreational therapy.   Physician Treatment Plan for Primary Diagnosis: Uncontrolled type 1 diabetes mellitus with hyperglycemia, with long-term current use of insulin  (HCC) Long Term Goal(s): Improvement in symptoms so as ready for discharge   Short Term Goals: Ability to identify changes in lifestyle to reduce recurrence of condition will improve Ability to verbalize feelings will improve Ability to disclose and discuss suicidal ideas Ability to demonstrate self-control will improve Ability to identify and develop effective coping behaviors will improve  Medication Management: Evaluate patient's response, side effects, and tolerance of medication regimen.  Therapeutic Interventions: 1 to 1 sessions, Unit Group sessions and Medication administration.  Evaluation of Outcomes: Adequate for Discharge  Physician Treatment Plan for Secondary Diagnosis: Principal Problem:   Uncontrolled type 1 diabetes mellitus with hyperglycemia, with long-term current use of insulin  (HCC) Active Problems:   Acquired hypothyroidism   HTN (hypertension)   Tracheostomy in place (HCC)   Hyperlipidemia, unspecified   MDD (major depressive disorder), recurrent severe, without psychosis (HCC)   Obesity (BMI 30-39.9)   History of DVT (deep vein thrombosis)   Elevated serum creatinine  Long Term Goal(s): Improvement in symptoms so as ready for discharge   Short Term Goals: Ability to identify changes in lifestyle to reduce recurrence of condition will improve Ability to verbalize feelings will improve Ability to disclose and discuss suicidal ideas Ability to demonstrate self-control will improve Ability to identify and develop  effective coping behaviors will improve     Medication Management: Evaluate patient's response, side effects, and tolerance of medication regimen.  Therapeutic Interventions: 1 to 1 sessions, Unit Group sessions and Medication administration.  Evaluation of Outcomes: Adequate for Discharge   RN Treatment Plan for Primary Diagnosis: Uncontrolled type 1 diabetes mellitus with hyperglycemia, with long-term current use of insulin  (HCC) Long Term Goal(s): Knowledge of disease and therapeutic regimen to maintain health will improve  Short Term Goals: Ability to remain free from injury will improve, Ability to verbalize frustration and anger appropriately will improve, Ability to demonstrate self-control, Ability to participate in decision making will improve, Ability to verbalize feelings will improve, Ability to disclose and discuss suicidal ideas, Ability to identify and develop effective coping behaviors will improve, and Compliance with prescribed medications will improve  Medication Management: RN will administer medications as ordered by provider, will assess and evaluate patient's response and provide education to patient for prescribed medication. RN will report any adverse and/or side effects to prescribing provider.  Therapeutic Interventions: 1 on 1 counseling sessions, Psychoeducation, Medication administration, Evaluate responses to treatment, Monitor vital signs and CBGs as ordered, Perform/monitor CIWA, COWS, AIMS and Fall Risk screenings as ordered, Perform wound care treatments as ordered.  Evaluation of Outcomes: Adequate for Discharge   LCSW Treatment Plan for Primary Diagnosis: Uncontrolled type 1 diabetes mellitus with hyperglycemia, with long-term current use of insulin  (HCC) Long Term Goal(s): Safe transition to appropriate next level of care at discharge, Engage patient in therapeutic group addressing interpersonal concerns.  Short Term Goals: Engage patient in aftercare  planning with referrals and resources, Increase social support, Increase ability to appropriately verbalize feelings, Increase emotional regulation, Facilitate acceptance of mental health diagnosis and concerns, Facilitate patient progression through stages of change regarding substance use diagnoses and concerns, Identify triggers associated with mental health/substance abuse issues, and Increase skills for wellness and recovery  Therapeutic Interventions: Assess for all discharge needs, 1 to 1  time with Child psychotherapist, Explore available resources and support systems, Assess for adequacy in community support network, Educate family and significant other(s) on suicide prevention, Complete Psychosocial Assessment, Interpersonal group therapy.  Evaluation of Outcomes: Adequate for Discharge   Progress in Treatment: Attending groups: Yes. and No. Participating in groups: Yes. and No. Taking medication as prescribed: Yes. Toleration medication: Yes. Family/Significant other contact made: Yes, Donia Shine, pt's mother  Patient understands diagnosis: Yes. Discussing patient identified problems/goals with staff: Yes. Medical problems stabilized or resolved: Yes., No., and As evidenced by:  hospitalist's seeing pt for blood sugars according to psychiatrist  Denies suicidal/homicidal ideation: Yes. Issues/concerns per patient self-inventory: No. Other: None    New problem(s) identified: No, Describe:  None identified Update 04/03/24: No changes at this time    New Short Term/Long Term Goal(s): elimination of symptoms of psychosis, medication management for mood stabilization; elimination of SI thoughts; development of comprehensive mental wellness plan. Update 04/03/24: No changes at this time    Patient Goals:  I don't know, get me on the right medicine to get me better Update 04/03/24: No changes at this time    Discharge Plan or Barriers: CSW will assist with appropriate discharge planning Update  04/03/24: No changes at this time    Reason for Continuation of Hospitalization: Depression Medication stabilization   Estimated Length of Stay: 1 to 7 days Update 04/03/24: 04/04/24  Last 3 [place] Suicide Severity Risk Score: Flowsheet Row Admission (Current) from 03/26/2024 in [facility] Most recent reading at 03/27/2024  1:40 PM ED from 03/25/2024 in [facility] Most recent reading at 03/25/2024  5:15 PM Office Visit from 03/25/2024 in [facility] Most recent reading at 03/25/2024  1:30 PM  C-SSRS RISK CATEGORY No Risk No Risk Moderate Risk    Last PHQ 2/9 Scores:    03/25/2024    1:30 PM 11/15/2023    2:19 PM 09/26/2023    3:42 PM  Depression screen PHQ 2/9  Decreased Interest 3 0 1  Down, Depressed, Hopeless 3 1 1   PHQ - 2 Score 6 1 2   Altered sleeping 3 0 1  Tired, decreased energy 2 3 1   Change in appetite 3 0 0  Feeling bad or failure about yourself  3 1 2   Trouble concentrating 2 1 0  Moving slowly or fidgety/restless 0 0 0  Suicidal thoughts 3 0 0  PHQ-9 Score 22 6  6    Difficult doing work/chores Very difficult Not difficult at all Somewhat difficult     Data saved with a previous flowsheet row definition    Scribe for Treatment Team: Lum JONETTA Croft, LCSWA 04/03/2024 11:11 AM

## 2024-04-03 NOTE — Plan of Care (Signed)
  Problem: Coping: Goal: Coping ability will improve Outcome: Progressing Goal: Will verbalize feelings Outcome: Progressing   Problem: Education: Goal: Knowledge about tracheostomy care/management will improve Outcome: Progressing

## 2024-04-03 NOTE — Group Note (Signed)
 Date:  04/03/2024 Time:  3:14 PM  Group Topic/Focus:  Thanksgiving Football and conversation     Participation Level:  Active  Participation Quality:  Appropriate  Affect:  Appropriate  Cognitive:  Appropriate  Insight: Appropriate  Engagement in Group:  Engaged  Modes of Intervention:  Activity and Socialization  Additional Comments:  none  Norleen SHAUNNA Bias 04/03/2024, 3:14 PM

## 2024-04-03 NOTE — Group Note (Signed)
 Date:  04/03/2024 Time:  11:21 AM  Group Topic/Focus:  Wellness Toolbox:   The focus of this group is to discuss various aspects of wellness, balancing those aspects and exploring ways to increase the ability to experience wellness.  Patients will create a wellness toolbox for use upon discharge.    Participation Level:  Did Not Attend  [name],[name] 04/03/2024, 11:21 AM

## 2024-04-04 LAB — GLUCOSE, CAPILLARY
Glucose-Capillary: 92 mg/dL (ref 70–99)
Glucose-Capillary: 190 mg/dL — ABNORMAL HIGH (ref 70–99)

## 2024-04-04 MED ORDER — DIPHENHYDRAMINE-ZINC ACETATE 2-0.1 % EX CREA: TOPICAL_CREAM | Freq: Three times a day (TID) | CUTANEOUS | 0 refills | Status: AC | PRN

## 2024-04-04 MED ORDER — NYSTATIN 100000 UNIT/GM EX CREA: TOPICAL_CREAM | Freq: Two times a day (BID) | CUTANEOUS | 0 refills | Status: AC

## 2024-04-04 MED ORDER — HYDROCORTISONE 1 % EX CREA: TOPICAL_CREAM | Freq: Two times a day (BID) | CUTANEOUS | 0 refills | Status: AC

## 2024-04-04 MED ORDER — CLONAZEPAM 0.5 MG PO TABS
0.5000 mg | ORAL_TABLET | Freq: Every day | ORAL | 0 refills | Status: AC
Start: 2024-04-05 — End: ?

## 2024-04-04 MED ORDER — CLONAZEPAM 1 MG PO TABS: 1.0000 mg | ORAL_TABLET | Freq: Every day | ORAL | 0 refills | Status: DC | PRN

## 2024-04-04 MED ORDER — INSULIN GLARGINE-YFGN 100 UNIT/ML ~~LOC~~ SOLN: 22.0000 [IU] | Freq: Two times a day (BID) | SUBCUTANEOUS | 11 refills | Status: AC

## 2024-04-04 MED ORDER — INSULIN ASPART 100 UNIT/ML IJ SOLN: 12.0000 [IU] | Freq: Three times a day (TID) | INTRAMUSCULAR | 11 refills | Status: AC

## 2024-04-04 MED ORDER — ESCITALOPRAM OXALATE 5 MG PO TABS
15.0000 mg | ORAL_TABLET | Freq: Every day | ORAL | 0 refills | Status: AC
Start: 2024-04-05 — End: ?

## 2024-04-04 MED ORDER — DICYCLOMINE HCL 20 MG PO TABS: 20.0000 mg | ORAL_TABLET | Freq: Three times a day (TID) | ORAL | 0 refills | Status: AC

## 2024-04-04 MED ORDER — METOPROLOL SUCCINATE ER 25 MG PO TB24: 25.0000 mg | ORAL_TABLET | Freq: Every day | ORAL | 0 refills | Status: AC

## 2024-04-04 MED ORDER — ZOLPIDEM TARTRATE 10 MG PO TABS: 10.0000 mg | ORAL_TABLET | Freq: Every day | ORAL | 0 refills | Status: DC

## 2024-04-04 MED ORDER — BUPROPION HCL ER (XL) 150 MG PO TB24: 150.0000 mg | ORAL_TABLET | Freq: Every day | ORAL | 0 refills | Status: DC

## 2024-04-04 NOTE — Discharge Summary (Signed)
 Physician Discharge Summary Note  Patient:  [name] is an 63 y.o., male MRN:  [id number] DOB:  Jul 31, 1960 Patient phone:  [phone] (home)  Patient address:   [address] [place] [address],   Total time spent: 40 min Date of Admission:  03/26/2024 Date of Discharge: 04/04/24  Reason for Admission:  [name] is a 62 y.o. male admitted: Presented to the ED for 03/25/2024  5:37 PM for for worsening depression and active suicidal ideation in the setting of functional decline and multiple failed attempts to be evaluated during recent ED visits. He carries the psychiatric diagnoses of Major Depressive Disorder, recurrent severe, and has a past medical history of diabetes mellitus, bilateral below-knee amputations, tracheostomy, and difficulty adjusting to chronic medical disability. Patient is admitted to [facility] unit with Q15 min safety monitoring. Multidisciplinary team approach is offered. Medication management; group/milieu therapy is offered.     Principal Problem: Uncontrolled type 1 diabetes mellitus with hyperglycemia, with long-term current use of insulin  [facility]) Discharge Diagnoses: Principal Problem:   Uncontrolled type 1 diabetes mellitus with hyperglycemia, with long-term current use of insulin  (HCC) Active Problems:   Acquired hypothyroidism   HTN (hypertension)   Tracheostomy in place (HCC)   Hyperlipidemia, unspecified   MDD (major depressive disorder), recurrent severe, without psychosis (HCC)   Obesity (BMI 30-39.9)   History of DVT (deep vein thrombosis)   Elevated serum creatinine   Past Psychiatric History: see h&p  Family Psychiatric  History: see h&p Social History:  Social History   Substance and Sexual Activity  Alcohol Use Yes   Alcohol/week: 2.0 standard drinks of alcohol   Types: 2 Shots of liquor per week     Social History   Substance and Sexual Activity  Drug Use No   Comment: Pt denied; UDS not available    Social  History   Socioeconomic History   Marital status: Widowed    Spouse name: [name]   Number of children: Not on file   Years of education: Not on file   Highest education level: Not on file  Occupational History   Not on file  Tobacco Use   Smoking status: Former    Current packs/day: 0.00    Types: Cigarettes    Quit date: 04/09/2013    Years since quitting: 10.9   Smokeless tobacco: Never  Vaping Use   Vaping status: Never Used  Substance and Sexual Activity   Alcohol use: Yes    Alcohol/week: 2.0 standard drinks of alcohol    Types: 2 Shots of liquor per week   Drug use: No    Comment: Pt denied; UDS not available   Sexual activity: Yes    Partners: Female    Birth control/protection: Condom  Other Topics Concern   Not on file  Social History Narrative   Not on file   Social Drivers of Health   Financial Resource Strain: Low Risk  (12/19/2023)   Received from [facility]   Overall Financial Resource Strain (CARDIA)    Difficulty of Paying Living Expenses: Not hard at all  Food Insecurity: No Food Insecurity (03/26/2024)   Hunger Vital Sign    Worried About Running Out of Food in the Last Year: Never true    Ran Out of Food in the Last Year: Never true  Transportation Needs: No Transportation Needs (03/26/2024)   PRAPARE - Administrator, Civil Service (Medical): No    Lack of Transportation (Non-Medical): No  Physical Activity: Sufficiently Active (07/12/2023)   Exercise Vital Sign    Days of Exercise per Week: 7 days    Minutes of Exercise per Session: 30 min  Stress: Stress Concern Present (07/12/2023)   [employer] of Occupational Health - Occupational Stress Questionnaire    Feeling of Stress : Very much  Social Connections: Socially Isolated (07/12/2023)   Social Connection and Isolation Panel    Frequency of Communication with Friends and Family: More than three times a week    Frequency of Social Gatherings with Friends and  Family: Never    Attends Religious Services: Never    Database Administrator or Organizations: No    Attends Banker Meetings: Never    Marital Status: Widowed   Past Medical History:  Past Medical History:  Diagnosis Date   Depression    Diabetes (HCC)    Insulin  Pump   Diabetes mellitus type I (HCC)    Diabetes mellitus without complication (HCC)    GERD (gastroesophageal reflux disease)    H/O laryngectomy    Heel bone fracture    History of embolic stroke    Hyperlipidemia    Hypertension    Radicular pain of right lower extremity    Stroke (HCC)    Suicide attempt (HCC) 2014   damaged larynx - tracheostomy   Thyroid  disease     Past Surgical History:  Procedure Laterality Date   COLONOSCOPY WITH PROPOFOL  N/A 05/15/2018   Procedure: COLONOSCOPY WITH PROPOFOL ;  Surgeon: [name], Ladell POUR, MD;  Location: [facility] ENDOSCOPY;  Service: Gastroenterology;  Laterality: N/A;   ESOPHAGOGASTRODUODENOSCOPY N/A 05/15/2018   Procedure: ESOPHAGOGASTRODUODENOSCOPY (EGD);  Surgeon: [name], Ladell POUR, MD;  Location: [facility] ENDOSCOPY;  Service: Gastroenterology;  Laterality: N/A;   FRACTURE SURGERY     Heel bone reconstruction Left    HERNIA REPAIR  02/2011   Umbilical hernia repair    LARYNGECTOMY     LOWER EXTREMITY ANGIOGRAPHY Left 01/31/2021   Procedure: LOWER EXTREMITY ANGIOGRAPHY;  Surgeon: Marea Selinda RAMAN, MD;  Location: [facility] INVASIVE CV LAB;  Service: Cardiovascular;  Laterality: Left;   LOWER EXTREMITY ANGIOGRAPHY Right 02/14/2021   Procedure: LOWER EXTREMITY ANGIOGRAPHY;  Surgeon: Marea Selinda RAMAN, MD;  Location: [facility] INVASIVE CV LAB;  Service: Cardiovascular;  Laterality: Right;   NECK SURGERY     fusion   SPINE SURGERY     TRACHEOSTOMY  2014   from SI attempt   Family History:  Family History  Problem Relation Age of Onset   Osteoporosis Mother    Diabetes Mother    Hypertension Father    Mental illness Neg Hx     Hospital Course:  [name] is a 63 y.o. male  admitted: Presented to the ED for 03/25/2024  5:37 PM for for worsening depression and active suicidal ideation in the setting of functional decline and multiple failed attempts to be evaluated during recent ED visits. He carries the psychiatric diagnoses of Major Depressive Disorder, recurrent severe, and has a past medical history of diabetes mellitus, bilateral below-knee amputations, tracheostomy, and difficulty adjusting to chronic medical disability. Patient is admitted to [facility] unit with Q15 min safety monitoring. Multidisciplinary team approach is offered. Medication management; group/milieu therapy is offered.     On admission, was continued on home medication of Wellbutrin  XL 300 mg which was eventually increased to 450 mg to see if it helps with depression but with the increase patient's anxiety got worse so Wellbutrin  XL was tapered down  to 150 mg daily, Klonopin  1 mg daily as needed, Lexapro  10 which was titrated up to 10 mg to help with depression and anxiety.  Klonopin  was increased to 0.5 mg every morning and 1 mg nightly as needed.  Patient tolerated the med adjustments fairly well.  His mood improved and is able to participate in the groups.  He reports improvement in depression and anxiety.  He denied SI/HI/plan on the unit.  Treatment plan and discharge planning was discussed with patient and his mother.  Resources for assisted living facilities was provided.  Social work team reached out to primary care doctor and requested a referral for home health aide.  Detailed risk assessment is complete based on clinical exam and individual risk factors and acute suicide risk is low and acute violence risk is low.    On the day of discharge, patient denies SI/HI/plan and denies hallucinations.  Patient remains future oriented and is willing to participate in outpatient mental health services.  Currently, all modifiable risk of harm to self/harm to others have been addressed and patient is no  longer appropriate for the acute inpatient setting and is able to continue treatment for mental health needs in the community with the supports as indicated below.  Patient is educated and verbalized understanding of discharge plan of care including medications, follow-up appointments, mental health resources and further crisis services in the community.  He is instructed to call 911 or present to the nearest emergency room should he experience any decompensation in mood, disturbance of bowel or return of suicidal/homicidal ideations.  Patient verbalizes understanding of this education and agrees to this plan of care  Medical follow-upUncontrolled type 1 diabetes mellitus with hyperglycemia, with long-term current use of insulin  (HCC) Improved glycemic control.  Continue Semglee  22 units twice daily.  Continue NovoLog  12 units 3 times daily with meals.  Continue sliding scale coverage.  Some lability noted.  Unclear etiology Plan: Continue basal bolus regimen while admitted Could resume home insulin  pump on discharge   Tracheostomy in place [facility]) No acute issues.  No indication of infection.  No indication for antibiotics   Elevated serum creatinine Creatinine peaked at 1.46.  Subsequently downtrending.  Approximately back at baseline.  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  Continue hydrochlorothiazide .  Hold Avapro  for now.  Can resume at discharge.  No further loop diuretics     HTN (hypertension) Patient on Norvasc , and Toprol .   Restart hydrochlorothiazide  11/26 Can restart Avapro  on discharge   Acquired hypothyroidism TSH within normal limits.  Continue Synthroid  75 mcg daily   History of DVT (deep vein thrombosis) On Eliquis    Obesity (BMI 30-39.9) BMI 33.77   MDD (major depressive disorder), recurrent severe, without psychosis (HCC) Patient on Lexapro , Zyprexa , Wellbutrin , Seroquel  and clonazepam  as per psychiatry.   Hyperlipidemia,  unspecified Continue Crestor .  CK normal limits.  No evidence of rhabdomyolysis. Diabetic educator recommendations in the hospital were Current Orders: Semglee  22 units BID                           Novolog  Moderate Correction Scale/ SSI (0-15 units) TID AC + HS                           Novolog  12 units TID with meals After discharge recommendations:Have pt restart Home Insulin  pump at Dinner time tonight   Pt should have  all pump supplies at home  Physical Findings: AIMS:  , ,  ,  ,    CIWA:    COWS:      Psychiatric Specialty Exam:  Presentation  General Appearance:  Appropriate for Environment; Casual  Eye Contact: Fair  Speech: Clear and Coherent  Speech Volume: Normal    Mood and Affect  Mood: Euthymic  Affect: Appropriate   Thought Process  Thought Processes: Coherent  Descriptions of Associations:Intact  Orientation:Full (Time, Place and Person)  Thought Content:Logical  Hallucinations:Hallucinations: None  Ideas of Reference:None  Suicidal Thoughts:Suicidal Thoughts: No  Homicidal Thoughts:Homicidal Thoughts: No   Sensorium  Memory: Immediate Fair; Remote Fair  Judgment: Fair  Insight: Fair   Art Therapist  Concentration: Fair  Attention Span: Fair  Recall: [employer] of Knowledge: Fair  Language: Fair   Psychomotor Activity  Psychomotor Activity: Psychomotor Activity: Normal  Musculoskeletal: Strength & Muscle Tone: decreased Gait & Station: unsteady Assets  Assets: Manufacturing Systems Engineer; Social Support   Sleep  Sleep: Sleep: Fair    Physical Exam: Physical Exam ROS Blood pressure 116/70, pulse 61, temperature (!) 97.5 F (36.4 C), resp. rate 18, height 6' (1.829 m), weight 112.9 kg, SpO2 99%. Body mass index is 33.77 kg/m.   Social History   Tobacco Use  Smoking Status Former   Current packs/day: 0.00   Types: Cigarettes   Quit date: 04/09/2013   Years since quitting: 10.9   Smokeless Tobacco Never   Tobacco Cessation:  N/A, patient does not currently use tobacco products   Blood Alcohol level:  Lab Results  Component Value Date   [facility] <15 03/25/2024   ETH <10 01/21/2019    Metabolic Disorder Labs:  Lab Results  Component Value Date   HGBA1C 7.8 (H) 03/25/2024   MPG 177.16 03/25/2024   MPG 165.68 10/28/2022   No results found for: PROLACTIN Lab Results  Component Value Date   CHOL 161 09/15/2016   TRIG 190 (H) 09/15/2016   HDL 41 09/15/2016   CHOLHDL 3.9 09/15/2016   VLDL 38 09/15/2016   LDLCALC 82 09/15/2016    See Psychiatric Specialty Exam and Suicide Risk Assessment completed by Attending Physician prior to discharge.  Discharge destination:  Home  Is patient on multiple antipsychotic therapies at discharge:  No   Has Patient had three or more failed trials of antipsychotic monotherapy by history:  No  Recommended Plan for Multiple Antipsychotic Therapies: NA  Discharge Instructions     Diet - low sodium heart healthy   Complete by: As directed    Increase activity slowly   Complete by: As directed       Allergies as of 04/04/2024       Reactions   Pregabalin Other (See Comments), Anaphylaxis, Swelling   Gum Bleeding Mouth/ gums swelling and bleeding Mouth swelling and bleeding gums   Buspar [buspirone]    Makes the patient flip out   Depakote  [valproic Acid ]    Causes excessive drowsiness   Clopidogrel Rash   Gabapentin Itching, Rash        Medication List     STOP taking these medications    clindamycin-benzoyl peroxide gel Commonly known as: BENZACLIN   Lantus  SoloStar 100 UNIT/ML Solostar Pen Generic drug: insulin  glargine   OLANZapine  10 MG tablet Commonly known as: ZyPREXA    sodium hypochlorite 0.125 % Soln Commonly known as: DAKIN'S 1/4 STRENGTH   telmisartan 80 MG tablet Commonly known as: MICARDIS   zaleplon  10 MG capsule Commonly known  as: SONATA  Replaced by: zolpidem  10 MG  tablet       TAKE these medications      Indication  amLODipine  5 MG tablet Commonly known as: NORVASC  Take 1 tablet (5 mg total) by mouth daily.  Indication: High Blood Pressure   apixaban  5 MG Tabs tablet Commonly known as: ELIQUIS  Take 1 tablet (5 mg total) by mouth 2 (two) times daily.    buPROPion  150 MG 24 hr tablet Commonly known as: WELLBUTRIN  XL Take 1 tablet (150 mg total) by mouth daily. Start taking on: April 05, 2024 What changed:  medication strength how much to take how to take this when to take this additional instructions    chlorthalidone  25 MG tablet Commonly known as: HYGROTON  Take 25 mg by mouth daily.    clonazePAM  1 MG tablet Commonly known as: KLONOPIN  Take 1 tablet (1 mg total) by mouth daily as needed (anxiety). What changed:  reasons to take this additional instructions  Indication: Feeling Anxious   clonazePAM  0.5 MG tablet Commonly known as: KLONOPIN  Take 1 tablet (0.5 mg total) by mouth daily with breakfast. Start taking on: April 05, 2024 What changed: You were already taking a medication with the same name, and this prescription was added. Make sure you understand how and when to take each.    Dexcom G7 Sensor Misc Use 1 each every 10 (ten) days    dicyclomine  20 MG tablet Commonly known as: BENTYL  Take 1 tablet (20 mg total) by mouth 3 (three) times daily before meals.    diphenhydrAMINE -zinc  acetate cream Commonly known as: BENADRYL  Apply topically 3 (three) times daily as needed for itching (for effected area on right arm).    escitalopram  5 MG tablet Commonly known as: LEXAPRO  Take 3 tablets (15 mg total) by mouth daily. Start taking on: April 05, 2024 What changed:  medication strength how much to take    HumaLOG 100 UNIT/ML injection Generic drug: insulin  lispro Inject 20-120 Units into the skin daily. Uses with Insulin  Pump    hydrocortisone  cream 1 % Apply topically 2 (two) times daily.    insulin   aspart 100 UNIT/ML injection Commonly known as: novoLOG  Inject 12 Units into the skin 3 (three) times daily with meals.    insulin  glargine-yfgn 100 UNIT/ML injection Commonly known as: SEMGLEE  Inject 0.22 mLs (22 Units total) into the skin 2 (two) times daily.    levothyroxine  75 MCG tablet Commonly known as: SYNTHROID  TAKE 1 TABLET BY MOUTH DAILY AT 6 AM    metoprolol  succinate 25 MG 24 hr tablet Commonly known as: TOPROL -XL Take 1 tablet (25 mg total) by mouth daily. Start taking on: April 05, 2024 What changed: See the new instructions.    mometasone  0.1 % cream Commonly known as: ELOCON  Apply to rash on arms and chest twice daily until improved.    nystatin  cream Commonly known as: MYCOSTATIN  Apply topically 2 (two) times daily.    ondansetron  4 MG disintegrating tablet Commonly known as: ZOFRAN -ODT Take 4 mg by mouth every 8 (eight) hours as needed for nausea or vomiting.    pantoprazole  40 MG tablet Commonly known as: PROTONIX  TAKE 1 TABLET(40 MG) BY MOUTH TWICE DAILY    rosuvastatin  20 MG tablet Commonly known as: CRESTOR  Take 20 mg by mouth at bedtime.    zolpidem  10 MG tablet Commonly known as: AMBIEN  Take 1 tablet (10 mg total) by mouth at bedtime. Replaces: zaleplon  10 MG capsule  Follow-up Information     [facility] Follow up on 04/21/2024.   Why: Your appointment is scheduled for 04/21/24 at 3:00 PM. I have asked scheduling to put in the appointment notes that you would like to discuss home  health with [facility] Contact information: [address] [place] [address] [phone]         [facility]  Follow up on 04/08/2024.   Why: You have an appointment scheduled for 04/08/24 at 3:00 PM with Dr.Eappen.                Follow-up recommendations:  Activity:  as tolearted    Signed: Allyn Foil, MD 04/04/2024, 1:26 PM

## 2024-04-04 NOTE — [facility] Suicide Risk Assessment (Signed)
 [facility] Discharge Suicide Risk Assessment   Principal Problem: Uncontrolled type 1 diabetes mellitus with hyperglycemia, with long-term current use of insulin  (HCC) Discharge Diagnoses: Principal Problem:   Uncontrolled type 1 diabetes mellitus with hyperglycemia, with long-term current use of insulin  (HCC) Active Problems:   Acquired hypothyroidism   HTN (hypertension)   Tracheostomy in place (HCC)   Hyperlipidemia, unspecified   MDD (major depressive disorder), recurrent severe, without psychosis (HCC)   Obesity (BMI 30-39.9)   History of DVT (deep vein thrombosis)   Elevated serum creatinine   Total Time spent with patient: 30 minutes  Musculoskeletal: Strength & Muscle Tone: decreased Gait & Station: unsteady Patient leans: N/A  Psychiatric Specialty Exam  Presentation  General Appearance:  Appropriate for Environment; Casual  Eye Contact: Fair  Speech: Clear and Coherent  Speech Volume: Normal  Handedness: Right   Mood and Affect  Mood: Euthymic  Duration of Depression Symptoms: Greater than two weeks  Affect: Appropriate   Thought Process  Thought Processes: Coherent  Descriptions of Associations:Intact  Orientation:Full (Time, Place and Person)  Thought Content:Logical  History of Schizophrenia/Schizoaffective disorder:No  Duration of Psychotic Symptoms:No data recorded Hallucinations:Hallucinations: None  Ideas of Reference:None  Suicidal Thoughts:Suicidal Thoughts: No  Homicidal Thoughts:Homicidal Thoughts: No   Sensorium  Memory: Immediate Fair; Remote Fair  Judgment: Fair  Insight: Fair   Art Therapist  Concentration: Fair  Attention Span: Fair  Recall: [employer] of Knowledge: Fair  Language: Fair   Psychomotor Activity  Psychomotor Activity: Psychomotor Activity: Normal   Assets  Assets: Communication Skills; Social Support   Sleep  Sleep: Sleep: Fair  Estimated Sleeping Duration (Last  24 Hours): 9.75-12.00 hours  Physical Exam: Physical Exam ROS Blood pressure 116/70, pulse 61, temperature (!) 97.5 F (36.4 C), resp. rate 18, height 6' (1.829 m), weight 112.9 kg, SpO2 99%. Body mass index is 33.77 kg/m.  Mental Status Per Nursing Assessment::   On Admission:  Self-harm thoughts  Demographic Factors:  Male  Loss Factors: Decrease in vocational status  Historical Factors: Impulsivity  Risk Reduction Factors:   Positive social support, Positive therapeutic relationship, and Positive coping skills or problem solving skills  Continued Clinical Symptoms:  Depression:   Impulsivity  Cognitive Features That Contribute To Risk:  None    Suicide Risk:  Minimal: No identifiable suicidal ideation.  Patients presenting with no risk factors but with morbid ruminations; may be classified as minimal risk based on the severity of the depressive symptoms   Follow-up Information     [facility] Follow up on 04/21/2024.   Why: Your appointment is scheduled for 04/21/24 at 3:00 PM. I have asked scheduling to put in the appointment notes that you would like to discuss home  health with [facility] Contact information: [address] [place] [address] [phone]         [facility]  Follow up on 04/08/2024.   Why: You have an appointment scheduled for 04/08/24 at 3:00 PM with Dr.Eappen.                Plan Of Care/Follow-up recommendations:  Activity:  as tolerated  Allyn Foil, MD 04/04/2024, 12:26 PM

## 2024-04-04 NOTE — Plan of Care (Signed)
  Problem: Education: Goal: Utilization of techniques to improve thought processes will improve Outcome: Completed/Met Goal: Knowledge of the prescribed therapeutic regimen will improve Outcome: Completed/Met   Problem: Coping: Goal: Coping ability will improve Outcome: Completed/Met Goal: Will verbalize feelings Outcome: Completed/Met   Problem: Coping: Goal: Coping ability will improve Outcome: Completed/Met   Problem: Self-Concept: Goal: Ability to disclose and discuss suicidal ideas will improve Outcome: Completed/Met Goal: Will verbalize positive feelings about self Outcome: Completed/Met Note: Patient is on track. Patient will work on increased adherence    Problem: Education: Goal: Knowledge about tracheostomy care/management will improve Outcome: Completed/Met   Problem: Activity: Goal: Ability to tolerate increased activity will improve Outcome: Completed/Met

## 2024-04-04 NOTE — Group Note (Signed)
 Date:  04/04/2024 Time:  10:28 AM  Group Topic/Focus:  Dimensions of Wellness:   The focus of this group is to introduce the topic of wellness and discuss the role each dimension of wellness plays in total health. Self Care:   The focus of this group is to help patients understand the importance of self-care in order to improve or restore emotional, physical, spiritual, interpersonal, and financial health.    Participation Level:  Did Not Attend  Participation Quality:  pt did not attend afternoon group.  Affect:  Appropriate  Cognitive:  Appropriate  Insight: Appropriate  Engagement in Group:  pt did not attend afternoon group.  Modes of Intervention:  pt did not attend afternoon group.   Additional Comments:  Pt did not attend afternoon group. Will to continue to motivate pt to attend later group sessions with peers.  Brad GORMAN Ryder 04/04/2024, 10:28 AM

## 2024-04-04 NOTE — Progress Notes (Signed)
 Brief hospitalist consult follow-up   Brief Narrative:   63 y.o. male with past medical history of type 1 diabetes on insulin  pump, status post laryngectomy and tracheostomy, history of stroke, dyslipidemia, essential hypertension, hypothyroidism, major depression, OCD, who presents to the hospital for treating depression.  Insulin  pump was removed due to concern of suicidal ideation.  Currently glucose running high.   Symptomatically, patient has a depressed mood.  Otherwise no complaint.   11/21.  Sugars very elevated last night and this morning.  Uses insulin  pump at home.  This morning needed to give one-time dose of NovoLog  10 and increase Semglee  insulin  to 20 units twice daily, increased meal coverage to 12 units 3 times daily. 11/22.  Patient states he is having a lot more trouble breathing with his trach.  He is suctioning himself.  He states he is not getting enough food. 11/23.  Patient is getting for some medication for decades.  Complains of some daytime anxiety and concerned about him living alone and cooking at home.  Asked nursing staff to cut one of the paper down pant leg so he can get his prosthesis on. 11/24.  Patient complains of daytime anxiety.  Complained of jock itch and wanted a cream. 11/25.  Patient complains of fatigue even though he slept a full 8 hours. 11/26: Patient seen and examined.  Overall improved.  Glycemic control improved.  Main complaint is swelling of the left stump and inability for prosthesis to be appropriately placed 11/28: Chart reviewed.  Some lability in glycemic control is noted but overall trends have been quite reassuring.  Stump shrinker in place and PT can assist with any Ace wrap or stump shrinker issues.   Assessment & Plan:  * Uncontrolled type 1 diabetes mellitus with hyperglycemia, with long-term current use of insulin  (HCC) Improved glycemic control.  Continue Semglee  22 units twice daily.  Continue NovoLog  12 units 3 times daily with  meals.  Continue sliding scale coverage.  Some lability noted.  Unclear etiology Plan: Continue basal bolus regimen while admitted Could resume home insulin  pump on discharge   Tracheostomy in place [facility]) No acute issues.  No indication of infection.  No indication for antibiotics   Elevated serum creatinine Creatinine peaked at 1.46.  Subsequently downtrending.  Approximately back at baseline.  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  Continue hydrochlorothiazide .  Hold Avapro  for now.  Can resume at discharge.  No further loop diuretics     HTN (hypertension) Patient on Norvasc , and Toprol .   Restart hydrochlorothiazide  11/26 Can restart Avapro  on discharge   Acquired hypothyroidism TSH within normal limits.  Continue Synthroid  75 mcg daily   History of DVT (deep vein thrombosis) On Eliquis    Obesity (BMI 30-39.9) BMI 33.77   MDD (major depressive disorder), recurrent severe, without psychosis (HCC) Patient on Lexapro , Zyprexa , Wellbutrin , Seroquel  and clonazepam  as per psychiatry.   Hyperlipidemia, unspecified Continue Crestor .  CK normal limits.  No evidence of rhabdomyolysis.  Thank you for involving [facility] in the care of your patient.   Hospitalist service to sign off at this time.   Please reach out with further questions or concerns.  Calvin Robson MD [facility] hospitalists  No charge

## 2024-04-04 NOTE — Progress Notes (Signed)
 SI/HI: denies  Behavior/Mood: alert and  oriented. Flat affect. Endorses anxiety 9/10. States he anxious about discharge home. Support and encouragement provided.   Intervention/Group: Interacting with peers and staff. Attends group.   Medications/PRNs: po med compliant. Klonopin  1mg  po given PRN for anxiety at 2150. Tol well.   Pain: denies  Other: d/c home 04/04/24. Slept 10 hours.    04/03/24 2200  Psych Admission Type (Psych Patients Only)  Admission Status Voluntary  Psychosocial Assessment  Patient Complaints Anxiety  Eye Contact Fair  Facial Expression Flat  Affect Appropriate to circumstance  Speech Logical/coherent  Interaction Demanding  Motor Activity Slow  Appearance/Hygiene Unremarkable  Behavior Characteristics Appropriate to situation  Mood Anxious  Thought Process  Coherency WDL  Content WDL  Delusions None reported or observed  Perception WDL  Hallucination None reported or observed  Judgment Impaired  Confusion None  Danger to Self  Current suicidal ideation? Denies

## 2024-04-04 NOTE — Plan of Care (Signed)
  Problem: Education: Goal: Utilization of techniques to improve thought processes will improve Outcome: Progressing Goal: Knowledge of the prescribed therapeutic regimen will improve Outcome: Progressing   Problem: Coping: Goal: Coping ability will improve Outcome: Progressing Goal: Will verbalize feelings Outcome: Progressing   

## 2024-04-04 NOTE — Progress Notes (Signed)
 Discharge Note:  Patient denies SI/HI/AVH at this time. Discharge instructions, AVS, and transition record reviewed with patient. [facility]. Patient states he is able to apply insulin  pump and he will do so when he gets home, as instructed. Patient agrees to comply with medication management, follow-up visit with PCP on April 21, 2024, and outpatient therapy on April 08, 2024. Patient belongings returned to patient including prosthesis and trach supplies. Patient questions and concerns addressed and answered. Patient wheeled off unit. Patient discharged to home with mom.

## 2024-04-04 NOTE — Inpatient Diabetes Management (Addendum)
 Inpatient Diabetes Program Recommendations  AACE/ADA: New Consensus Statement on Inpatient Glycemic Control (2015)  Target Ranges:  Prepandial:   less than 140 mg/dL      Peak postprandial:   less than 180 mg/dL (1-2 hours)      Critically ill patients:  140 - 180 mg/dL    Latest Reference Range & Units 04/04/24 07:41 04/04/24 11:23  Glucose-Capillary 70 - 99 mg/dL 92 809 (H)  (H): Data is abnormally high    History: Type 1 diabetes   Home DM Meds: Insulin  Pump   Current Orders: Semglee  22 units BID                           Novolog  Moderate Correction Scale/ SSI (0-15 units) TID AC + HS                           Novolog  12 units TID with meals      Received page from NP discharging pt home today  Questions about having pt restart insulin  pump for home  Recommend:  Have pt restart Home Insulin  pump at Dinner time tonight  Pt should have all pump supplies at home  If pt still in the hospital at Dinner time, please give him the SSI and Meal coverage as ordered (if pump not yet restarted)  ENDO: Dr. Rosalie call ENDO if having issues with CBGs after discharge   --Will follow patient during hospitalization--  Adina Rudolpho Arrow RN, MSN, CDCES Diabetes Coordinator Inpatient Glycemic Control Team Team Pager: [phone] (8a-5p)

## 2024-04-04 NOTE — BHH Counselor (Signed)
 CSW contacted pt's mother, Tim Corriher, [phone] with Dr.J per provider's request.   Provider explained clinical updates to pt's mother.   CSW provided information on follow-up appointments and referrals.   CSW sent referral to [facility] per family's request for placement in the future.   Pt's mother agreeable to discharge.   Lum Croft, MSW, [place] 04/04/2024 12:09 PM

## 2024-04-04 NOTE — Progress Notes (Signed)
  [facility] Adult Case Management Discharge Plan :  Will you be returning to the same living situation after discharge:  Yes,  pt will return home  At discharge, do you have transportation home?: Yes,  pt's parents will pick him up  Do you have the ability to pay for your medications: Yes,  [employer] / [employer] PPO  Release of information consent forms completed and in the chart;  Patient's signature needed at discharge.  Patient to Follow up at:  Follow-up Information     [facility] Follow up on 04/21/2024.   Why: Your appointment is scheduled for 04/21/24 at 3:00 PM. I have asked scheduling to put in the appointment notes that you would like to discuss home  health with [facility] Contact information: [address] [place] [address] [phone]         [facility]  Follow up on 04/08/2024.   Why: You have an appointment scheduled for 04/08/24 at 3:00 PM with Dr.Eappen.                Next level of care provider has access to [facility] Link:no  Safety Planning and Suicide Prevention discussed: Chaney,  Kenan Moodie, mother, [phone]     Has patient been referred to the Quitline?: Patient refused referral for treatment  Patient has been referred for addiction treatment: No known substance use disorder.  [address], LCSWA 04/04/2024, 1:56 PM

## 2024-04-04 NOTE — Group Note (Deleted)
 Recreation Therapy Group Note   Group Topic:Leisure Education  Group Date: 04/04/2024 Start Time: 1120 End Time: 1200 Facilitators: Celestia Jeoffrey BRAVO, LRT Location: Craft Room  Group Description: Leisure. Patients were given the option to choose from journaling, coloring, drawing, making origami, playing with playdoh, listening to music or singing karaoke. LRT and pts discussed the meaning of leisure, the importance of participating in leisure during their free time/when they're outside of the hospital, as well as how our leisure interests can also serve as coping skills.   Goal Area(s) Addressed:  Patient will identify a current leisure interest.  Patient will learn the definition of "leisure". Patient will practice making a positive decision. Patient will have the opportunity to try a new leisure activity. Patient will communicate with peers and LRT.      Affect/Mood: {RT BHH Affect/Mood:26271}   Participation Level: {RT BHH Participation Level:26267}   Participation Quality: {RT BHH Participation Quality:26268}   Behavior: {RT BHH Group Behavior:26269}   Speech/Thought Process: {RT BHH Speech/Thought:26276}   Insight: {RT BHH Insight:26272}   Judgement: {RT BHH Judgement:26278}   Modes of Intervention: {RT BHH Modes of Intervention:26277}   Patient Response to Interventions:  {RT BHH Patient Response to Intervention:26274}   Education Outcome:  {RT BHH Education Outcome:26279}   Clinical Observations/Individualized Feedback: *** was *** in their participation of session activities and group discussion. Pt identified ***   Plan: {RT BHH Tx Eojw:73719}   Jeoffrey BRAVO Celestia, LRT,  04/04/2024 1:30 PM

## 2024-04-04 NOTE — Group Note (Signed)
 Recreation Therapy Group Note   Group Topic:Leisure Education  Group Date: 04/04/2024 Start Time: 1215 End Time: 1300 Facilitators: Celestia Jeoffrey BRAVO, LRT, CTRS Location: Dayroom  Group Description: Bingo. Patients played multiple rounds of bingo. LRT and pts discussed the definition of leisure, things they do in their free time outside of the hospital, and how bingo is also a leisure activity. Pts received a journal or coloring book as their bingo prize.   Goal Area(s) Addressed:  Patient will identify a current leisure interest.  Patient will learn the definition of "leisure". Patient will have the opportunity to try a new leisure activity. Patient will communicate with peers and LRT.   Affect/Mood: Appropriate   Participation Level: Active and Engaged   Participation Quality: Independent   Behavior: Calm and Cooperative   Speech/Thought Process: Coherent   Insight: Fair   Judgement: Fair    Modes of Intervention: Cooperative Play, Music, and Socialization   Patient Response to Interventions:  Attentive, Engaged, and Receptive   Education Outcome:  Acknowledges education   Clinical Observations/Individualized Feedback: [name] was active in their participation of session activities and group discussion. Pt won a facilities manager and chose a coloring book as his prize. Pt interacted well with LRT and peers duration of session.    Plan: Continue to engage patient in RT group sessions 2-3x/week.   Jeoffrey BRAVO Celestia, LRT, CTRS 04/04/2024 1:54 PM

## 2024-04-08 ENCOUNTER — Ambulatory Visit: Admitting: Psychiatry

## 2024-04-08 DIAGNOSIS — Z9002 Acquired absence of larynx: Secondary | ICD-10-CM | POA: Diagnosis not present

## 2024-04-08 DIAGNOSIS — Z89512 Acquired absence of left leg below knee: Secondary | ICD-10-CM | POA: Diagnosis not present

## 2024-04-11 ENCOUNTER — Other Ambulatory Visit: Payer: Self-pay | Admitting: Psychiatry

## 2024-04-11 DIAGNOSIS — F5101 Primary insomnia: Secondary | ICD-10-CM

## 2024-04-24 ENCOUNTER — Telehealth: Payer: Self-pay

## 2024-04-24 ENCOUNTER — Ambulatory Visit: Admitting: Psychiatry

## 2024-04-24 ENCOUNTER — Encounter: Payer: Self-pay | Admitting: Psychiatry

## 2024-04-24 VITALS — BP 110/70 | HR 66 | Temp 97.6°F | Ht 72.0 in | Wt 249.0 lb

## 2024-04-24 DIAGNOSIS — Z91148 Patient's other noncompliance with medication regimen for other reason: Secondary | ICD-10-CM | POA: Diagnosis not present

## 2024-04-24 DIAGNOSIS — F332 Major depressive disorder, recurrent severe without psychotic features: Secondary | ICD-10-CM | POA: Diagnosis not present

## 2024-04-24 DIAGNOSIS — F411 Generalized anxiety disorder: Secondary | ICD-10-CM | POA: Diagnosis not present

## 2024-04-24 DIAGNOSIS — F5101 Primary insomnia: Secondary | ICD-10-CM | POA: Diagnosis not present

## 2024-04-24 MED ORDER — AUVELITY 45-105 MG PO TBCR: 1.0000 | EXTENDED_RELEASE_TABLET | Freq: Every day | ORAL | 0 refills | Status: DC

## 2024-04-24 NOTE — Patient Instructions (Signed)
 Please stop Bupropion  or wellbutrin  once you start Auvelity .   Dextromethorphan ; Bupropion  Extended-Release Tablets What is this medication? DEXTROMETHORPHAN ; BUPROPION  (dex troe meth OR fan; byoo PROE pee on) treats depression. It works by balancing substances in your brain that help regulate mood. It is a combination of dextromethorphan  and an NDRI. This medicine may be used for other purposes; ask your health care provider or pharmacist if you have questions. COMMON BRAND NAME(S): AUVELITY  What should I tell my care team before I take this medication? They need to know if you have any of these conditions: An eating disorder, such as anorexia or bulimia Bipolar disorder Brain or spine tumor Diabetes treated with medications Frequently drink alcohol Glaucoma Heart disease, previous heart attack, or irregular heart beat Head injury High blood pressure History of substance use disorder Kidney disease Liver disease Low blood sugar Low levels of sodium in the blood Schizophrenia Seizures Stroke Suicidal thoughts, plans, or attempt by you or a family member Weight loss An unusual or allergic reaction to bupropion , dextromethorphan , other medications, foods, dyes, or preservatives Pregnant or trying to become pregnant Breastfeeding How should I use this medication? Take this medication by mouth with a glass of water. Follow the directions on the prescription label. Do not cut, crush, or chew this medication. Swallow the tablets whole. You can take this medication with or without food. If it upsets your stomach, take it with food. Take your medication at regular intervals. Do not take it more often than directed. Do not stop taking this medication except on your care team's advice. Stopping this medication too quickly may cause serious side effects or worsen your condition. A special MedGuide will be given to you by the pharmacist with each prescription and refill. Be sure to read this  information carefully each time. Talk to your care team about the use of this medication in children. Special care may be needed. Overdosage: If you think you have taken too much of this medicine contact a poison control center or emergency room at once. NOTE: This medicine is only for you. Do not share this medicine with others. What if I miss a dose? If you miss a dose, skip it. Take your next dose at the normal time. Do not take extra or 2 doses at the same time to make up for the missed dose. What may interact with this medication? Do not take this medication with any of the following: Linezolid MAOIs, such as Azilect, Carbex, Eldepryl, Marplan, Nardil, and Parnate Methylene blue (injected into a vein) Other medications that contain bupropion , such as Wellbutrin  or Zyban  Other medications that contain dextromethorphan , such as Robitussin or Delsym  This medication may also interact with the following: Alcohol Certain medications for anxiety or sleep Certain medications for blood pressure, such as metoprolol  or propranolol Certain medications for depression or other mental health conditions Certain medications for HIV or AIDS, such as efavirenz, lopinavir, nelfinavir, ritonavir Certain medications for irregular heartbeat, such as propafenone or flecainide Certain medications for Parkinson disease, such as amantadine or levodopa Certain medications for seizures, such as carbamazepine, phenytoin, phenobarbital Cimetidine Clopidogrel Cyclophosphamide Digoxin Furazolidone Isoniazid Nicotine Orphenadrine Procarbazine Steroid medications, such as prednisone or cortisone Stimulant medications for attention disorders, weight loss, or to stay awake Tamoxifen Theophylline Thiotepa Ticlopidine Tramadol  Warfarin This list may not describe all possible interactions. Give your health care provider a list of all the medicines, herbs, non-prescription drugs, or dietary supplements you use.  Also tell them if you smoke, drink alcohol,  or use illegal drugs. Some items may interact with your medicine. What should I watch for while using this medication? Visit your care team for regular checks on your progress. Tell your care team if your symptoms do not get better or if they get worse. Because it may take several weeks to see the full effects of this medication, it is important to continue your treatment as prescribed. Watch for new or worsening thoughts of suicide or depression. This includes sudden changes in mood, behaviors, or thoughts. These changes can happen at any time but are more common in the beginning of treatment or after a change in dose. Call your care team right away if you experience these thoughts or worsening depression. Manic episodes may happen in patients with bipolar disorder who take this medication. Watch for changes in feelings or behaviors such as feeling anxious, nervous, agitated, panicky, irritable, hostile, aggressive, impulsive, severely restless, overly excited and hyperactive, or trouble sleeping. These symptoms can happen at anytime but are more common in the beginning of treatment or after a change in dose. Call your care team right away if you notice any of these symptoms. This medication may cause serious skin reactions. They can happen weeks to months after starting the medication. Contact your care team right away if you notice fevers or flu-like symptoms with a rash. The rash may be red or purple and then turn into blisters or peeling of the skin. Or, you might notice a red rash with swelling of the face, lips or lymph nodes in your neck or under your arms. Avoid drinks that contain alcohol while taking this medication. Drinking large amounts of alcohol, using sleeping or anxiety medications, or quickly stopping the use of these agents while taking this medication may increase your risk for a seizure. Do not drive or use heavy machinery until you know how  this medication affects you. This medication can impair your ability to perform these tasks. Your mouth may get dry. Chewing sugarless gum or sucking hard candy and drinking plenty of water may help. Contact your care team if the problem does not go away or is severe. What side effects may I notice from receiving this medication? Side effects that you should report to your care team as soon as possible: Allergic reactions--skin rash, itching, hives, swelling of the face, lips, tongue, or throat Increase in blood pressure Mood and behavior changes--anxiety, nervousness, confusion, hallucinations, irritability, hostility, thoughts of suicide or self-harm, worsening mood, feelings of depression Redness, blistering, peeling, or loosening of the skin, including inside the mouth Seizures Sudden eye pain or change in vision such as blurry vision, seeing halos around lights, vision loss Side effects that usually do not require medical attention (report these to your care team if they continue or are bothersome): Change in sex drive or performance Diarrhea Dizziness Drowsiness Dry mouth Excessive sweating Headache This list may not describe all possible side effects. Call your doctor for medical advice about side effects. You may report side effects to FDA at 1-800-FDA-1088. Where should I keep my medication? Keep out of the reach of children and pets. Store between 20 and 25 degrees C (68 and 77 degrees F). Keep this medication in the original container until you are ready to take it. Get rid of any unused medication after the expiration date. To get rid of medications that are no longer needed or have expired: Take the medication to a medication take-back program. Check with your pharmacy or law enforcement to  find a location. If you cannot return the medication, check the label or package insert to see if the medication should be thrown out in the garbage or flushed down the toilet. If you are not  sure, ask your care team. If it is safe to put it in the trash, take the medication out of the container. Mix the medication with cat litter, dirt, coffee grounds, or other unwanted substance. Seal the mixture in a bag or container. Put it in the trash. NOTE: This sheet is a summary. It may not cover all possible information. If you have questions about this medicine, talk to your doctor, pharmacist, or health care provider.  2024 Elsevier/Gold Standard (2022-10-25 00:00:00)

## 2024-04-24 NOTE — Telephone Encounter (Signed)
 Please check with pharmacy when was the last time he picked up his zolpidem  10 mg and what quantity.  It looks like according to controlled substance database it was picked up 04/04/2024 for 30-day supply and he should not be due for it.  Please let me know once you have verify with pharmacy.

## 2024-04-24 NOTE — Telephone Encounter (Signed)
 Please let them know , he is not due , please let mother know , looks like we have an ROI to communicate with mother as well.

## 2024-04-24 NOTE — Telephone Encounter (Signed)
 Called pts mother, she did not answer left our number for her to call back.

## 2024-04-24 NOTE — Telephone Encounter (Signed)
 Pt stated provider asked him to check when he got home on medication he checked and stated he is out of the zolpidem  (AMBIEN ) 10 MG tablet  [facility] uses [id number] [address], [place] - [address]

## 2024-04-24 NOTE — Progress Notes (Unsigned)
 This note is not being shared with the patient for the following reason: To prevent harm (release of this note would result in harm to the life or physical safety of the patient or another).   BH MD OP Progress Note  04/24/2024 12:04 PM [name]  MRN:  [id number]  Chief Complaint:  Chief Complaint  Patient presents with   Follow-up   Anxiety   Depression   Medication Refill   Discussed the use of AI scribe software for clinical note transcription with the patient, who gave verbal consent to proceed.  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  He is accompanied by his mother [name].  Patient being a limited historian majority of information obtained from mother.  Recent admission and discharge from inpatient behavioral health geriatric unit at [facility]-03/26/2024 - 04/04/2024.  Patient continues to struggle with ongoing depression symptoms which includes low mood, lack of motivation and interest in activities.  Persistent passive suicidal thoughts affect him, and he states that these thoughts occur constantly and that he feels tired of his current state. He denies having any active plans or intent to harm himself and clarifies that he is not actively thinking of killing himself or considering methods to do so. His mother checks on him daily.His mother observes that he frequently stays in bed, sometimes  appears unchanged, and verbalizes feeling depressed. She notes that his depressive episodes often coincide with physical complaints such as stomach discomfort and pain. He expresses reluctance to engage in therapy, stating that previous therapy did not help him feel better.  Significant anxiety, particularly related to breathing difficulties and fears of dying, impacts him. He describes frequent episodes of feeling unable to breathe, which cause distress and fear. His mother notes that he texted her about breathing difficulties and feeling scared, and she observes that he often feels he cannot breathe well despite normal oxygen saturation readings.  His current medications include Wellbutrin , recently reduced to 150 mg, Lexapro  15 mg, clonazepam  1 mg daily, and Ambien  10 mg for sleep. He expresses dissatisfaction with recent medication changes during his hospitalization.  He has 4 children--2 sons and 2 daughters, all adults living in different states ([place] , [place], [place], [place]). He reports not seeing his children often. Family members, including children and their spouses, plan to visit for holidays and may stay for up to 2 weeks.      Visit Diagnosis:    ICD-10-CM   1. Severe episode of recurrent major depressive disorder, without psychotic features (HCC)  F33.2 Dextromethorphan -buPROPion  ER (AUVELITY ) 45-105 MG TBCR    2. GAD (generalized anxiety disorder)  F41.1     3. Primary insomnia  F51.01       Past Psychiatric History: I have reviewed past psychiatric history from progress note on 01/15/2019.  Past trials of medications like fluoxetine, Rexulti , Paxil, Celexa , Lexapro , Wellbutrin , Zyprexa , Depakote , Xanax , Abilify . Recent behavioral health [facility] geriatric unit-03/26/2024 - 04/04/2024. Completed TMS-twice in 2021, 2024 History of ECT-did not tolerate. Multiple inpatient  behavioral health admissions in the past-2020.  Possible remote suicide attempt several  years ago when he tried to overdose on bug spray as well as had plans to jump in front of a train although he did not go through with it.  History of overdose on opioids in 2014 however reports it was not a suicide attempt and that he was trying to get high and it was accidental overdose.  Past Medical History:  Past Medical History:  Diagnosis Date   Depression    Diabetes (HCC)    Insulin  Pump   Diabetes mellitus type I (HCC)    Diabetes mellitus without complication (HCC)    GERD (gastroesophageal reflux disease)    H/O laryngectomy    Heel bone fracture    History of embolic stroke    Hyperlipidemia    Hypertension    Radicular pain of right lower extremity    Stroke (HCC)    Suicide attempt (HCC) 2014   damaged larynx - tracheostomy   Thyroid  disease     Past Surgical History:  Procedure Laterality Date   COLONOSCOPY WITH PROPOFOL  N/A 05/15/2018   Procedure: COLONOSCOPY WITH PROPOFOL ;  Surgeon: [name], Ladell POUR, MD;  Location: [facility] ENDOSCOPY;  Service: Gastroenterology;  Laterality: N/A;   ESOPHAGOGASTRODUODENOSCOPY N/A 05/15/2018   Procedure: ESOPHAGOGASTRODUODENOSCOPY (EGD);  Surgeon: [name], Ladell POUR, MD;  Location: [facility] ENDOSCOPY;  Service: Gastroenterology;  Laterality: N/A;   FRACTURE SURGERY     Heel bone reconstruction Left    HERNIA REPAIR  02/2011   Umbilical hernia repair    LARYNGECTOMY     LOWER EXTREMITY ANGIOGRAPHY Left 01/31/2021   Procedure: LOWER EXTREMITY ANGIOGRAPHY;  Surgeon: Marea Selinda RAMAN, MD;  Location: [facility] INVASIVE CV LAB;  Service: Cardiovascular;  Laterality: Left;   LOWER EXTREMITY ANGIOGRAPHY Right 02/14/2021   Procedure: LOWER EXTREMITY ANGIOGRAPHY;  Surgeon: Marea Selinda RAMAN, MD;  Location: [facility] INVASIVE CV LAB;  Service: Cardiovascular;  Laterality: Right;   NECK SURGERY     fusion   SPINE SURGERY     TRACHEOSTOMY  2014   from SI attempt    Family Psychiatric History: Reviewed past psychiatric history from progress note on 01/15/2019.  Family  History:  Family History  Problem Relation Age of Onset   Osteoporosis Mother    Diabetes Mother    Hypertension Father    Mental illness Neg Hx     Social History: I have reviewed social history from progress note on 01/15/2019. Social History   Socioeconomic History   Marital status: Widowed    Spouse name: [name]   Number of children: Not on file   Years of education: Not on file   Highest education level: Not on file  Occupational History   Not on file  Tobacco Use   Smoking status: Former    Current packs/day: 0.00    Types: Cigarettes    Quit date: 04/09/2013    Years since quitting: 11.0   Smokeless tobacco: Never  Vaping Use   Vaping status: Never Used  Substance and Sexual Activity   Alcohol use: Yes    Alcohol/week: 2.0 standard drinks of alcohol    Types: 2 Shots of liquor per week   Drug use: No    Comment: Pt denied; UDS not available   Sexual activity: Yes    Partners: Female    Birth control/protection: Condom  Other Topics Concern   Not on file  Social History Narrative   Not on file   Social Drivers of  Health   Tobacco Use: Medium Risk (04/24/2024)   Patient History    Smoking Tobacco Use: Former    Smokeless Tobacco Use: Never    Passive Exposure: Not on file  Financial Resource Strain: Low Risk  (12/19/2023)   Received from [facility]   Overall Financial Resource Strain (CARDIA)    Difficulty of Paying Living Expenses: Not hard at all  Food Insecurity: No Food Insecurity (03/26/2024)   Epic    Worried About Programme Researcher, Broadcasting/film/video in the Last Year: Never true    Ran Out of Food in the Last Year: Never true  Transportation Needs: No Transportation Needs (03/26/2024)   Epic    Lack of Transportation (Medical): No    Lack of Transportation (Non-Medical): No  Physical Activity: Sufficiently Active (07/12/2023)   Exercise Vital Sign    Days of Exercise per Week: 7 days    Minutes of Exercise per Session: 30 min  Stress: Stress  Concern Present (07/12/2023)   [employer] of Occupational Health - Occupational Stress Questionnaire    Feeling of Stress : Very much  Social Connections: Socially Isolated (07/12/2023)   Social Connection and Isolation Panel    Frequency of Communication with Friends and Family: More than three times a week    Frequency of Social Gatherings with Friends and Family: Never    Attends Religious Services: Never    Database Administrator or Organizations: No    Attends Banker Meetings: Never    Marital Status: Widowed  Depression (PHQ2-9): High Risk (03/25/2024)   Depression (PHQ2-9)    PHQ-2 Score: 22  Alcohol Screen: Low Risk (03/26/2024)   Alcohol Screen    Last Alcohol Screening Score (AUDIT): 0  Housing: Low Risk (03/26/2024)   Epic    Unable to Pay for Housing in the Last Year: No    Number of Times Moved in the Last Year: 0    Homeless in the Last Year: No  Utilities: Not At Risk (03/26/2024)   Epic    Threatened with loss of utilities: No  Health Literacy: Adequate Health Literacy (07/12/2023)   B1300 Health Literacy    Frequency of need for help with medical instructions: Never    Allergies: Allergies[1]  Metabolic Disorder Labs: Lab Results  Component Value Date   HGBA1C 7.8 (H) 03/25/2024   MPG 177.16 03/25/2024   MPG 165.68 10/28/2022   No results found for: PROLACTIN Lab Results  Component Value Date   CHOL 161 09/15/2016   TRIG 190 (H) 09/15/2016   HDL 41 09/15/2016   CHOLHDL 3.9 09/15/2016   VLDL 38 09/15/2016   LDLCALC 82 09/15/2016   Lab Results  Component Value Date   TSH 2.800 04/02/2024   TSH 1.952 09/15/2016    Therapeutic Level Labs: No results found for: LITHIUM Lab Results  Component Value Date   VALPROATE 19 (L) 09/12/2016   No results found for: CBMZ  Current Medications: Current Outpatient Medications  Medication Sig Dispense Refill   Dextromethorphan -buPROPion  ER (AUVELITY ) 45-105 MG TBCR Take 1 tablet by  mouth daily with breakfast. 30 tablet 0   amLODipine  (NORVASC ) 5 MG tablet Take 1 tablet (5 mg total) by mouth daily. 30 tablet 1   apixaban  (ELIQUIS ) 5 MG TABS tablet Take 1 tablet (5 mg total) by mouth 2 (two) times daily. 60 tablet 5   chlorthalidone  (HYGROTON ) 25 MG tablet Take 25 mg by mouth daily.     clonazePAM  (KLONOPIN ) 1 MG  tablet Take 1 tablet (1 mg total) by mouth daily as needed (anxiety). 30 tablet 0   Continuous Glucose Sensor (DEXCOM G7 SENSOR) MISC Use 1 each every 10 (ten) days     dicyclomine  (BENTYL ) 20 MG tablet Take 1 tablet (20 mg total) by mouth 3 (three) times daily before meals. 90 tablet 0   diphenhydrAMINE -zinc  acetate (BENADRYL ) cream Apply topically 3 (three) times daily as needed for itching (for effected area on right arm). 28.4 g 0   escitalopram  (LEXAPRO ) 5 MG tablet Take 3 tablets (15 mg total) by mouth daily. 90 tablet 0   HUMALOG 100 UNIT/ML injection Inject 20-120 Units into the skin daily. Uses with Insulin  Pump     hydrocortisone  cream 1 % Apply topically 2 (two) times daily. 30 g 0   insulin  aspart (NOVOLOG ) 100 UNIT/ML injection Inject 12 Units into the skin 3 (three) times daily with meals. 10 mL 11   insulin  glargine-yfgn (SEMGLEE ) 100 UNIT/ML injection Inject 0.22 mLs (22 Units total) into the skin 2 (two) times daily. 10 mL 11   levothyroxine  (SYNTHROID ) 75 MCG tablet TAKE 1 TABLET BY MOUTH DAILY AT 6 AM 90 tablet 0   metoprolol  succinate (TOPROL -XL) 25 MG 24 hr tablet Take 1 tablet (25 mg total) by mouth daily. 30 tablet 0   mometasone  (ELOCON ) 0.1 % cream Apply to rash on arms and chest twice daily until improved. 45 g 1   nystatin  cream (MYCOSTATIN ) Apply topically 2 (two) times daily. 30 g 0   ondansetron  (ZOFRAN -ODT) 4 MG disintegrating tablet Take 4 mg by mouth every 8 (eight) hours as needed for nausea or vomiting. (Patient not taking: Reported on 03/25/2024)     pantoprazole  (PROTONIX ) 40 MG tablet TAKE 1 TABLET(40 MG) BY MOUTH TWICE DAILY      rosuvastatin  (CRESTOR ) 20 MG tablet Take 20 mg by mouth at bedtime.     zolpidem  (AMBIEN ) 10 MG tablet Take 1 tablet (10 mg total) by mouth at bedtime. 30 tablet 0   No current facility-administered medications for this visit.     Musculoskeletal: Strength & Muscle Tone: UTA Gait & Station: in a wheelchair Patient leans: N/A  Psychiatric Specialty Exam: Review of Systems  Psychiatric/Behavioral:  Positive for decreased concentration, dysphoric mood and sleep disturbance. The patient is nervous/anxious.     Blood pressure 110/70, pulse 66, temperature 97.6 F (36.4 C), temperature source Temporal, height 6' (1.829 m), weight 249 lb (112.9 kg).Body mass index is 33.77 kg/m.  General Appearance: Casual  Eye Contact:  Poor  Speech:  Normal Rate  Volume:  Baseline, status post trach  Mood:  Anxious and Depressed  Affect:  Congruent  Thought Process:  Goal Directed and Descriptions of Associations: Intact  Orientation:  Full (Time, Place, and Person)  Thought Content: Rumination   Suicidal Thoughts:  Currently denies any active suicidality however does report recurrent thoughts of not wanting to exist although denies any plan or intent.  Homicidal Thoughts:  No  Memory:  Immediate;   Fair Recent;   Fair Remote;   Fair  Judgement:  Fair  Insight:  Fair  Psychomotor Activity:  Normal  Concentration:  Concentration: Fair and Attention Span: Fair  Recall:  [employer] of Knowledge: Fair  Language: Fair  Akathisia:  No  Handed:  Right  AIMS (if indicated): not done  Assets:  Desire for Improvement Housing Social Support Transportation  ADL's:  Intact  Cognition: WNL  Sleep:  Varies, excessive at times  Screenings: AIMS    Flowsheet Row Office Visit from 03/25/2024 in [facility] Office Visit from 09/26/2023 in [facility] Office Visit from 12/28/2022 in [facility] Office Visit from 04/05/2022 in [facility] Video Visit from 10/31/2021 in [facility]  AIMS Total Score 0 0 0 0 0   AUDIT    Flowsheet Row Admission (Discharged) from 01/22/2019 in [facility]IENT BE[facility]AVIORAL MEDICINE Admission (Discharged) from 12/31/2018 in [facility] INPATIENT BEHAVIORAL MEDICINE Admission (Discharged) from 09/14/2016 in [facility] INPATIENT BEHAVIORAL MEDICINE  Alcohol Use Disorder Identification Test Final Score (AUDIT) 0 0 1   ECT-MADRS    Flowsheet Row Admission (Discharged) from 01/22/2019 in [facility] INPATIENT BEHAVIORAL MEDICINE  MADRS Total Score 27   GAD-7    Flowsheet Row Office Visit from 03/25/2024 in [facility] Office Visit from 07/23/2023 in [facility] Counselor from 07/12/2023 in [facility] Office Visit from 03/20/2023 in [facility] Video Visit from 08/01/2022 in [facility]  Total GAD-7 Score 11 2 17 8 6    Mini-Mental    Flowsheet Row Admission (Discharged) from 01/22/2019 in [facility] INPATIENT BEHAVIORAL MEDICINE  Total Score (max 30 points ) 30   PHQ2-9    Flowsheet Row Office Visit from 03/25/2024 in [facility] Counselor from 11/15/2023 in [facility] Office Visit from 09/26/2023 in [facility] Office Visit from 07/23/2023 in [facility] Counselor from 07/12/2023 in [facility]  PHQ-2 Total Score 6 1 2 2 3   PHQ-9 Total Score 22 6 6 6 16    Flowsheet Row Admission (Discharged) from 03/26/2024 in [facility] BEHAVIORAL MEDICINE Most recent reading at 03/27/2024  1:40 PM ED from  03/25/2024 in [facility] Emergency Department at [facility] Most recent reading at 03/25/2024  5:15 PM Office Visit from 03/25/2024 in [facility] Most recent reading at 03/25/2024  1:30 PM  C-SSRS RISK CATEGORY No Risk No Risk Moderate Risk     Assessment and Plan: [name] is a 63 year old male who presented for a follow-up appointment, discussed assessment and plan as noted below.  1. Severe episode of recurrent major depressive disorder, without psychotic features (HCC)-unstable Currently reports ongoing depression symptoms, passive suicidality although denies any active intent or plan. Change Wellbutrin  to Auvelity  45 mg - 105 mg once daily. Continue Lexapro  15 mg daily. Discussed referral for IOP/PHP as well as individual psychotherapy.  Patient declines. Patient previously completed TMS multiple times.  2. GAD (generalized anxiety disorder)-unstable Ongoing anxiety symptoms, situational factors likely contributing to the same as well. Continue Lexapro  15 mg daily Continue clonazepam  1 mg daily as needed. Advised to limit use. Reviewed [place] PMP AWARxE   3. Primary insomnia-improving Currently reports Ambien  is beneficial although does report sleep varies with excessive sleepiness some days. Continue Ambien  10 mg at bedtime. Advised not to take more than what is prescribed. Discussed with mother to assist with management of medications, setting up a pillbox and monitoring it closely.  Collateral information obtained from mother who was present in session.  Safety plan discussed with mother, informed to lock away excess medications, restrict access to anything sharp.  Mother agrees to supervise patient closely and  get immediate help with any worsening symptoms.  Follow-up Follow-up in clinic in 2 to 3 weeks or sooner in person.    Collaboration of Care: Collaboration of Care: Patient refused AEB patient refused referral for  individual psychotherapy/PHP/IOP.  I have also reviewed notes per recent admission and discharged to [facility] geriatric unit dated 03/26/2024 - 04/04/2024  Patient/Guardian was advised Release of Information must be obtained prior to any record release in order to collaborate their care with an outside provider. Patient/Guardian was advised if they have not already done so to contact the registration department to sign all necessary forms in order for us  to release information regarding their care.   Consent: Patient/Guardian gives verbal consent for treatment and assignment of benefits for services provided during this visit. Patient/Guardian expressed understanding and agreed to proceed.   I have spent atleast 40 minutes face to face with patient today which includes the time spent for preparing to see the patient ( e.g., review of test, records ), obtaining and to review and separately obtained history , ordering medications ,psychoeducation and supportive psychotherapy and care coordination,as well as documenting clinical information in electronic health record.   Haylin Camilli, MD 04/24/2024, 12:04 PM     [1]  Allergies Allergen Reactions   Pregabalin Other (See Comments), Anaphylaxis and Swelling    Gum Bleeding  Mouth/ gums swelling and bleeding  Mouth swelling and bleeding gums   Buspar [Buspirone]     Makes the patient flip out   Depakote  [Valproic Acid ]     Causes excessive drowsiness   Clopidogrel Rash   Gabapentin Itching and Rash

## 2024-04-25 DIAGNOSIS — Z91148 Patient's other noncompliance with medication regimen for other reason: Secondary | ICD-10-CM | POA: Insufficient documentation

## 2024-04-25 NOTE — Telephone Encounter (Signed)
 PT'S mother aware she verbalized understanding medication could not be refilled as he should still have enough, no other concerns at this time.

## 2024-05-06 NOTE — Consults (Signed)
 SLP Alaryngeal Evaluation Evaluation (05/06/24 2001)   Patient Name:  [name]      Medical Record Number: [id number]  Date of Birth: [birth year] Sex: Male          Treatment Diagnosis: Aphonia status post total laryngectomy   Speech Therapy Session Duration SLP Individual [mins]: 75 Activity Tolerance: Patient tolerated treatment well  Assessment: Chief Complaint: Difficulty breathing and speaking.  Patient seen after admission to [facility] emergency department complaining of high breathing resistance (shortness of breath).  Patient was triaged into ER space.  He was seen following ENT evaluation.  Patient continues to demonstrate O2 saturation level in the high 90s.  He is independent with 9/55 Larytube with fenestrated shaft for voicing improvement.  He also continues with HME and is completely independent with use of all of these devices.  Upon first review, patient is calm but was working toward coughing secretions from tracheostoma/Larytube.  Patient without deep suction catheter but suctioning coughed secretions externally.  Additionally he continually uses Yankauer suction for regurgitation into oral cavity, usually simultaneous with cough.  He does not present with stridor and appears to be stable.  His saturation level is normal.  Patient is accompanied by his mother who remains devoted to his care and has transport him to the ER.  SLP spoke with patient on phone this afternoon, where he demonstrated significant anxiety and acute complaint of breathing difficulty.  SLP recommended calling ambulance versus coming into the emergency department at [facility].  Given patient's oxygen saturation level and truly subjective distress, patient's mother brought him to the ED.  SLP removed patient's 9/55 fenestrated Larytube without difficulty.  TEP prosthesis was seen at posterior tracheal wall.  The fit of the prosthesis mildly questionable secondary to flange displacement with swallowing.  This  has been a common issue with patient's fistula as it is not a flush wall for flange retention.  Patient shows no signs of that prosthesis is dislodging.  Additionally, no leaks are noted at TEP prosthesis upon testing using water sips.  Lastly, SLP had patient sit quietly after removal of Larytube.  Patient indicated that Larytube removal aided comfort and made breathing feel better.  SLP introduced idea of returning to housing plus HME/peristomal attachment.  This will have to be done within the ENT/speech clinic.  Patient is interested in coming in on January 9 at 130 for starting process of transitioning from Larytube to peristomal attachment.  Patient was counseled regarding this process.  However, he is aware that Larytube is likely to still be needed and cannot be fully weaned.  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  SLP returned 9/55 fenestrated Larytube to in situ position at tracheostoma.  Patient with calm breathing and good saturation level, and fluent TEP without TEP prosthesis leak upon exit.  Will follow-up in clinic on May 16, 2024 at 1:30 PM.  Patient aware of appointment.  No further targets.  Patient provided with: S/SX of prosthesis failure, Cleaning protocol for prosthesis, Education materials  Prognosis: Good Positive Indicators: Favorable outcome and treatment plan. Family support, Good cognition, compliance Barriers to Discharge: Endurance deficits, Functional strength deficits, Inability to safely perform ADLS, Inaccessible home environment, Gait instability  Patient and Family Goal: Improve breathing with Larytube/TEP prosthesis present  Subjective Prior Function: Independent prior to admission Lives With: Alone Communication Preference: Verbal Patient/Caregiver Reports: I feel less short of breath  with Larytube out.  Current  Medications[1]  Past Medical History[2]  Social History   Tobacco Use   Smoking status: Former    Current packs/day: 0.00    Average packs/day: 0.5 packs/day for 25.0 years (12.5 ttl pk-yrs)    Types: Cigarettes    Start date: 04/07/1988    Quit date: 04/07/2013    Years since quitting: 11.0   Smokeless tobacco: Never  Substance Use Topics   Alcohol use: Not Currently   Past Surgical History[3] Family History[4]  Pregabalin, Buspirone, Valproic acid , Gabapentin, and Plavix [clopidogrel]  Equipment/Environment:  (9/55 Fenestrated Larytube) Services patient receives prior to admission: SLP   Precautions: Falls precautions, Delirium Precautions, Aspiration precautions, Isolation precautions Required Braces or Orthoses: Non-applicable  Objective Prosthesis Placement Type:  (Airway check/Larytube seal improvement/TEP Assessment) Prosthesis Type: Indwelling, Vega  Prosthesis Size: 22.5 FR Prosthesis Length: 10 Placement Confirmation: No leakage during water leak test Stoma Support: Beula Denis, HME Stoma Hygiene - Change to Stoma Care: Lary clips TEP Support - Change to TEP accessories: TE Prosthesis brush/pipette Patient provided with: S/SX of prosthesis failure, Cleaning protocol for prosthesis, Education materials Discussed with: MD, RN, Patient, Family/Caregivers  Patient at end of session: All needs in reach  I attest that I have reviewed the above information. Signed: Redell Monas, CCC-SLP Filed 05/06/2024      [1] No current facility-administered medications for this encounter.   Current Outpatient Medications  Medication Sig Dispense Refill   acetaminophen  (TYLENOL ) 325 MG tablet Take 2 tablets (650 mg total) by mouth every six (6) hours as needed for pain. 30 tablet 0   amLODIPine  (NORVASC ) 10 MG tablet Take 1 tablet (10 mg total) by mouth daily. 90 tablet 0   aspirin  (ECOTRIN) 81 MG tablet Take 1 tablet (81 mg total) by mouth daily.     buPROPion   (WELLBUTRIN  XL) 300 MG 24 hr tablet Take 1 tablet (300 mg total) by mouth every morning.     chlorthalidone  (HYGROTON ) 25 MG tablet Take 1 tablet (25 mg total) by mouth daily. (Patient not taking: Reported on 04/08/2024) 30 tablet 0   ciprofloxacin-dexAMETHasone (CIPRODEX) 0.3-0.1 % otic suspension Apply 5 drops into the lary stoma twice daily. Please place these drops without the Lary tube in place and replace the lary tube once finished. (Patient not taking: Reported on 04/08/2024) 12 mL 0   ELIQUIS  5 mg Tab Take 1 tablet (5 mg total) by mouth two (2) times a day.     empagliflozin  (JARDIANCE ) 25 mg tablet Take 0.5 tablets (12.5 mg total) by mouth daily. (Patient not taking: Reported on 04/08/2024)     insulin  lispro (HUMALOG) 100 unit/mL injection pen Inject 8 Units under the skin Three (3) times a day before meals. Also use as sliding scale and inject additional insulin  based on blood glucose vales: BG 150-189: 1 unit; BG 190-229: 2 units; BG 230-269: 3 units; BG 270-309: 4 units; BG 310-349: 5 units; BG: 350-389: 6 units; BG >390: 7 units 15 mL 0   insulin  NPH isoph U-100 human (HUMULIN) 100 unit/mL (3 mL) injection pen Inject 20 Units under the skin two (2) times a day. (Patient not taking: Reported on 12/04/2023) 12 mL 0   levothyroxine  (SYNTHROID ) 75 MCG tablet Take 1 tablet (75 mcg total) by mouth daily. 30 tablet 0   losartan  (COZAAR ) 100 MG tablet Take 1 tablet (100 mg total) by mouth daily.     methocarbamol (ROBAXIN) 500 MG tablet Take 1 tablet (500 mg total) by  mouth two (2) times a day. (Patient not taking: Reported on 04/08/2024)     metoprolol  succinate (TOPROL -XL) 25 MG 24 hr tablet Take 1 tablet (25 mg total) by mouth daily.     multivitamins, therapeutic with minerals 9 mg iron-400 mcg tablet Take 1 tablet by mouth daily. 30 tablet 0   nortriptyline  (PAMELOR ) 25 MG capsule Take 1 capsule (25 mg total) by mouth nightly. (Patient not taking: Reported on 12/04/2023)      OLANZapine  (ZYPREXA ) 5 MG tablet Take 1 tablet (5 mg total) by mouth nightly. (Patient not taking: Reported on 04/08/2024)     pantoprazole  (PROTONIX ) 40 MG tablet Take 1 tablet (40 mg total) by mouth Two (2) times a day (30 minutes before a meal).     pen needle, diabetic 32 gauge x 5/32 (4 mm) Ndle Use with insulin  up to 4 times/day as needed. 100 each 0   rosuvastatin  (CRESTOR ) 20 MG tablet Take 1 tablet (20 mg total) by mouth in the morning.     sodium chloride  irrigation (NS) 0.9 % irrigation Irrigate with 1,000 mL as directed in the morning. - Mix 0.5mL of ciprodex suspension with 4mL of normal saline solution - Administer this solution through home nebulizer 1 time per day for 7 days - Discontinue if any adverse symptoms occur like rash, thrush, trouble breathing, shortness of breath, or other concerning symptoms.. (Patient not taking: Reported on 12/04/2023) 500 mL 0   telmisartan (MICARDIS) 80 MG tablet Take 1 tablet (80 mg total) by mouth daily.     zolpidem  (AMBIEN ) 10 mg tablet Take 1 tablet (10 mg total) by mouth nightly.    [2] Past Medical History: Diagnosis Date   CVA (cerebral vascular accident)    (CMS-HCC) 04/2013   initial right sided weakness   Depression    Diabetes mellitus (CMS-HCC)    DVT (deep venous thrombosis) (CMS-HCC) 06/2013   R arm, L leg on Plavix for a period of time   Hypercholesteremia    Hypertension    Hypothyroidism    Tracheal stenosis 2015  [3] Past Surgical History: Procedure Laterality Date   FOOT SURGERY     NECK SURGERY     PR AMPUTATION LOW LEG THRU TIB/FIB Left 04/12/2021   Procedure: AMPUTA LEG THRU TIBIA & FIBULA;  Surgeon: Lang Glean Devonshire, MD;  Location: MAIN OR [facility];  Service: Vascular   PR BRONCHOSCOPY W/TRANSBRONCL NDL ASPIR BX EA LOBE Midline 07/15/2013   Procedure: BRONCHOSCOPY, RIGID OR FLEXIBLE, W/FLUORO; W/TRANSBRONCHIAL NEEDLE ASPIRATION BIOPSY(S), EACH ADD`L LOBE;  Surgeon: Adline LOISE Blanch, MD;  Location: MAIN OR  [facility];  Service: ENT   PR BRONCHOSCOPY,TRACH/BRONCH DILATN Bilateral 09/03/2013   Procedure: BRONCHOSCOPY, RIGID/FLEXIBLE, INCL FLUOROSCOPIC GUIDANCE; W/TRACHIAL/BRONCH DILATION OR CLOSED REDUCTION FX;  Surgeon: Genell LOISE Fairly, MD;  Location: MAIN OR [facility];  Service: ENT   PR CREATE T-E FISTULA+SPEECH PROSTHESIS Midline 03/30/2014   Procedure: CONSTRUCTION OF TRACHEOESOPHAGEAL FISTULA & SUBSEQUENT INSERTION OF AN ALARYNGEAL SPEECH PROSTHESIS;  Surgeon: Adline LOISE Blanch, MD;  Location: MAIN OR [facility];  Service: ENT   PR CRICOPHAYNGEAL MYOTOMY Midline 03/30/2014   Procedure: CRICOPHARYNGEAL MYOTOMY;  Surgeon: Adline LOISE Blanch, MD;  Location: MAIN OR [facility];  Service: ENT   PR DRAIN LOWER LEG DEEP ABSC/HEMATOMA Left 05/07/2021   Procedure: I&D LEG/ANK; DEEP ABSCESS/HEMATOMA;  Surgeon: Pierce Maryelizabeth Finn, MD;  Location: MAIN OR [facility];  Service: Vascular   PR ESOPHAGOSCOPY,DIAGNOSTIC Midline 07/15/2013   Procedure: ESOPHAGOSCOPY, RIGID OR FLEXIBLE; DIAGNOSTIC, W/WO COLLECTION OF SPECIMEN(S) BY BRUSHING  OR WASHING;  Surgeon: Adline LOISE Blanch, MD;  Location: MAIN OR [facility];  Service: ENT   PR INCISION & DRAINAGE COMPLEX PO WOUND INFECTION Left 04/06/2023   Procedure: INCISION AND DRAINAGE, COMPLEX, POSTOPERATIVE WOUND INFECTION; FOOT;  Surgeon: Luwanna Caretha Anis, MD;  Location: OR [facility];  Service: Vascular   PR LARYNGOSCOPY,DIRCT,OP SCOP,EXC TUMR Bilateral 09/03/2013   Procedure: LARYNGOSCOPY, DIRECT, OPERATIVE, W/EXCISION TUMOR &/OR STRIPPING VOCAL CORD/EPIGLOTTIS; W/OPERA MICRO/TELES;  Surgeon: Genell LOISE Fairly, MD;  Location: MAIN OR [facility];  Service: ENT   PR LARYNGOSCOPY,DIRCT,OP SCOPE,BIOPSY Midline 07/15/2013   Procedure: LARYNGOSCOPY, DIRECT, OPERATIVE, WITH BIOPSY; WITH OPERATING MICROSCOPE OR TELESCOPE;  Surgeon: Adline LOISE Blanch, MD;  Location: MAIN OR [facility];  Service: ENT   PR LARYNGOSCOPY,DIRCT,OP SCOPE,BIOPSY N/A 10/29/2013   Procedure: LARYNGOSCOPY, DIRECT, OPERATIVE, WITH BIOPSY; WITH OPERATING  MICROSCOPE OR TELESCOPE;  Surgeon: Genell LOISE Fairly, MD;  Location: MAIN OR [facility];  Service: ENT   PR LARYNGOSCOPY,DIRECT,DX,OP MICROSCOP Midline 07/23/2013   Procedure: LARYNGOSCOPY DIRECT WITH OR WITHOUT TRACHEOSCPY; DIAGNOSTIC, WITH OPERATING MICROSCOPE OR TELESCOPE;  Surgeon: Adline LOISE Blanch, MD;  Location: MAIN OR [facility];  Service: ENT   PR REMOVAL OF LARYNX N/A 03/30/2014   Procedure: LARYNGECTOMY; TOT WO RADICAL NECK DISSECTION;  Surgeon: Adline LOISE Blanch, MD;  Location: MAIN OR [facility];  Service: ENT   PR REVISE TRACHEOSTOMY SCAR N/A 12/13/2017   Procedure: REVISION OF TRACHEOSTOMY SCAR;  Surgeon: Adline Micki Blanch, MD;  Location: MAIN OR [facility];  Service: ENT   PR TRACH REVISION,SIMPLE N/A 12/13/2017   Procedure: TRACHEOSTOMA REVIS; SIMPL WO FLAP ROTATION;  Surgeon: Adline Micki Blanch, MD;  Location: MAIN OR [facility];  Service: ENT   PR TRACH REVISION,SIMPLE N/A 04/23/2018   Procedure: TRACHEOSTOMA REVIS; SIMPL WO FLAP ROTATION;  Surgeon: Adline Micki Blanch, MD;  Location: MAIN OR [facility];  Service: ENT   PR TRACHEOSTOMY, PLANNED Midline 07/15/2013   Procedure: TRACHEOSTOMY PLANNED (SEPART PROC);  Surgeon: Adline LOISE Blanch, MD;  Location: MAIN OR [facility];  Service: ENT   PR UPPER GI ENDOSCOPY,BIOPSY N/A 08/03/2014   Procedure: UGI ENDOSCOPY; WITH BIOPSY, SINGLE OR MULTIPLE;  Surgeon: Enos LILLETTE Bean, MD;  [facility];  Service: Gastroenterology   SPINAL FUSION    [4] Family History Problem Relation Age of Onset   Diabetes Mother    Heart failure Maternal Grandfather    Anesthesia problems Neg Hx    Bleeding Disorder Neg Hx

## 2024-05-06 NOTE — ED Provider Notes (Signed)
 ED Progress Note  Received sign out from previous provider.  Patient Summary: [name] is a 63 y.o. 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  ENT scoped at bedside noted granulation tissue at the distal tube that is nonobstructive.  They recommended Ciprodex drops and budesonide  inhaler. Action List:  Pending ENT final recommendations   Updates ED Course as of 05/07/24 0626  Tue May 06, 2024  1915 [name] with laryntrachectomy with lary tube, p/w SOB x2  weeks. ENT scoped and has granulation tissue at bottom of the tube. Congested. Pending ENT final recs with ciprodex drps nad budesonide  inhaler.   1939 Lactate, Venous(!): 3.1  1939 WBC(!): 11.3  1939 Creatinine(!): 1.40  2324 Lactate, Venous: 1.0  Wed May 07, 2024  0625 Patient's elevated lactate responded very well to fluids and patient is feeling a little bit better.  He endorses difficulty with urination but is still able to urinate.  No UTI on urinalysis.  Discussed with patient that there is no acute indication for patient to stay in the hospital at this time and he should follow-up with ENT in the outpatient setting.  He should also follow-up with his primary care regarding constipation and difficulty with urination.  Patient is agreeable with plan for discharge to return precautions.  Patient is tolerating p.o. intake.  Patient is companied by family who is also agreeable with strict return precautions

## 2024-05-06 NOTE — ED Provider Notes (Signed)
 [facility] Emergency Department Provider Note    ED Clinical Impression     Diagnosis ICD-10-CM Associated Orders  1. Dysphagia, unspecified type  R13.10 Tracheal protocol per RT    2. Shortness of breath  R06.02           Impression, Medical Decision Making, Progress Notes and Critical Care    Impression, Differential Diagnosis and Plan of Care  [name] is a 63 y.o. 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  On initial assessment patient is tachypneic but was in no acute distress.  He is hemodynamically stable, saturating appropriately on room air.  Lary tube is in place.  On initial assessment he was demonstrating increased work of breathing that was improved with suctioning from the Lary tube.  Nasal congestion noted on bilateral nasal passages but otherwise physical exam was largely unremarkable.  After assistance with suctioning he was able to clear secretions through coughing and appeared to be much calmer with less work of breathing while continue to have appropriate saturation.  ED Course as of 05/07/24 1024  Tue May 06, 2024  1658 Patient brought back from triage with a history of a laryngectomy in 2015 due to subglottic stenosis.  Patient reportedly had worsening shortness of breath for the last 2 weeks, saturating properly on room air but tachypneic.  Patient was seen by ENT at the bedside to bedside bronchoscopy which showed granulation tissue at the distal end of the Lary tube that was not obstructive to the airway.  No emergent intervention indicated per ENT however, we will continue with Ciprodex drops/nebulizers as well as budesonide  inhaler and start workup to the determine an infectious process  likely causative of his current symptoms.   Lab work demonstrated mild leukocytosis with a white count of 11.3 however, based on patient's initial presentation this is where like that is reactive versus an impression of infectious source.  No significant electrolyte derangements noted.  Creatinine is slightly elevated from baseline at 1.4 with strong suspicion this may be secondary to hypovolemia we will obtain UA to evaluate for any infectious cause.  RPP negative.  At time of shift end remainder of patient's workup was pending as well as finalization of ENT recommendations.  Patient signed out to oncoming resident.  Additional MDM Elements     Discussion with other professionals: Consultant - ENT Independent interpretation: EKG(s) - NSR at a rate of 62 bpm.  Normal axis.  Occasional PVCs but otherwise no acute changes or ST elevations or indicate myocardial ischemia.        Portions of this record have been created using Scientist, clinical (histocompatibility and immunogenetics). Dictation errors have been sought, but may not have been identified and corrected.  See chart and nursing documentation for additional ED course details.  ____________________________________________      History     Reason for Visit Shortness of Breath   HPI  [name] is a 63 y.o. 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  Patient reports he has been wearing his Lary tube as instructed and has been  cleaning it daily.  Has also been utilizing Ciprodex drops and he does inhaler at home however, he notes little relief with the inhaler in particular.  He endorses nasal congestion and feels as if he cannot catch his breath.  He denies any associated chest pain,  palpitations, difficulty swallowing, odynophagia, fever, chills.  Outside Historian(s) (EMS, Significant Other, Family, Parent, Caregiver, Friend, Patent Examiner, etc.)  Parent  Past Medical History[1]  Past Surgical History[2]  No current facility-administered medications for this encounter.  Current Outpatient Medications:    acetaminophen  (TYLENOL ) 325 MG tablet, Take 2 tablets (650 mg total) by mouth every six (6) hours as needed for pain., Disp: 30 tablet, Rfl: 0   amLODIPine  (NORVASC ) 10 MG tablet, Take 1 tablet (10 mg total) by mouth daily., Disp: 90 tablet, Rfl: 0   aspirin  (ECOTRIN) 81 MG tablet, Take 1 tablet (81 mg total) by mouth daily., Disp: , Rfl:    budesonide  (PULMICORT ) 0.5 mg/2 mL nebulizer solution, Inhale 2 mL (0.5 mg total) by nebulization daily., Disp: 60 mL, Rfl: 11   buPROPion  (WELLBUTRIN  XL) 300 MG 24 hr tablet, Take 1 tablet (300 mg total) by mouth every morning., Disp: , Rfl:    chlorthalidone  (HYGROTON ) 25 MG tablet, Take 1 tablet (25 mg total) by mouth daily. (Patient not taking: Reported on 04/08/2024), Disp: 30 tablet, Rfl: 0   ciprofloxacin-dexAMETHasone (CIPRODEX) 0.3-0.1 % otic suspension, Apply 5 drops into the lary stoma twice daily. Please place these drops without the Lary tube in place and replace the lary tube once finished., Disp: 12 mL, Rfl: 0   ELIQUIS  5 mg Tab, Take 1 tablet (5 mg total) by mouth two (2) times a day., Disp: , Rfl:    empagliflozin  (JARDIANCE ) 25 mg tablet, Take 0.5 tablets (12.5 mg total) by mouth daily. (Patient not taking: Reported on 04/08/2024), Disp: , Rfl:    insulin  lispro (HUMALOG) 100 unit/mL injection pen, Inject 8 Units under the skin Three (3) times a day before meals. Also use as sliding scale and inject additional insulin  based on blood glucose vales: BG 150-189: 1 unit; BG 190-229: 2 units; BG 230-269: 3 units; BG 270-309: 4 units; BG 310-349: 5 units; BG: 350-389: 6 units; BG >390: 7 units, Disp: 15 mL, Rfl:  0   insulin  NPH isoph U-100 human (HUMULIN) 100 unit/mL (3 mL) injection pen, Inject 20 Units under the skin two (2) times a day. (Patient not taking: Reported on 12/04/2023), Disp: 12 mL, Rfl: 0   levothyroxine  (SYNTHROID ) 75 MCG tablet, Take 1 tablet (75 mcg total) by mouth daily., Disp: 30 tablet, Rfl: 0   losartan  (COZAAR ) 100 MG tablet, Take 1 tablet (100 mg total) by mouth daily., Disp: , Rfl:    methocarbamol (ROBAXIN) 500 MG tablet, Take 1 tablet (500 mg total) by mouth two (2) times a day. (Patient not taking: Reported on 04/08/2024), Disp: , Rfl:    metoprolol  succinate (TOPROL -XL) 25 MG 24 hr tablet, Take 1 tablet (25 mg total) by mouth daily., Disp: , Rfl:    multivitamins, therapeutic with minerals 9 mg iron-400 mcg tablet, Take 1 tablet by mouth daily., Disp: 30 tablet, Rfl: 0   nortriptyline  (PAMELOR ) 25 MG capsule, Take 1 capsule (25 mg total) by mouth nightly. (Patient not taking: Reported on 12/04/2023), Disp: , Rfl:    OLANZapine  (ZYPREXA ) 5 MG tablet, Take 1 tablet (5 mg total) by mouth nightly. (Patient not taking: Reported on 04/08/2024), Disp: , Rfl:    pantoprazole  (PROTONIX ) 40  MG tablet, Take 1 tablet (40 mg total) by mouth Two (2) times a day (30 minutes before a meal)., Disp: , Rfl:    pen needle, diabetic 32 gauge x 5/32 (4 mm) Ndle, Use with insulin  up to 4 times/day as needed., Disp: 100 each, Rfl: 0   rosuvastatin  (CRESTOR ) 20 MG tablet, Take 1 tablet (20 mg total) by mouth in the morning., Disp: , Rfl:    sodium chloride  irrigation (NS) 0.9 % irrigation, Irrigate with 1,000 mL as directed in the morning. - Mix 0.5mL of ciprodex suspension with 4mL of normal saline solution - Administer this solution through home nebulizer 1 time per day for 7 days - Discontinue if any adverse symptoms occur like rash, thrush, trouble breathing, shortness of breath, or other concerning symptoms.. (Patient not taking: Reported on 12/04/2023), Disp: 500 mL, Rfl: 0   telmisartan  (MICARDIS) 80 MG tablet, Take 1 tablet (80 mg total) by mouth daily., Disp: , Rfl:    zolpidem  (AMBIEN ) 10 mg tablet, Take 1 tablet (10 mg total) by mouth nightly., Disp: , Rfl:   Allergies Pregabalin, Buspirone, Valproic acid , Gabapentin, and Plavix [clopidogrel]  Family History[3]  Short Social History[4]    Physical Exam   ED Triage Vitals  Enc Vitals Group     BP 05/06/24 1450 114/74     Pulse 05/06/24 1434 62     SpO2 Pulse --      Resp 05/06/24 1450 19     Temp 05/06/24 1450 36.9 C (98.4 F)     Temp src --      SpO2 05/06/24 1434 97 %     Weight 05/06/24 1450 (!) 108.9 kg (240 lb)     Height --      Head Circumference --      Peak Flow --      Pain Score --      Pain Loc --      Pain Education --      Exclude from Growth Chart --     Constitutional: Alert and oriented.  Increased work of breathing  but in no acute distress. Eyes: Conjunctivae are normal. ENT      Head: Normocephalic and atraumatic.      Nose: Mild congestion noted in bilateral nasal passages      Mouth/Throat: Mucous membranes are moist.      Neck: No stridor. Cardiovascular: Normal rate, regular rhythm.  Respiratory: Increased respiratory effort but saturating appropriately.  No stridor or wheezing noted. Musculoskeletal: Moving all extremity spontaneously Neurologic: No gross focal neurologic deficits are appreciated. Skin: Skin is warm, dry and intact. No rash noted. Psychiatric: Mood and affect are normal. Speech and behavior are normal.    Radiology   XR Chest 2 views  Final Result    No acute cardiopulmonary abnormalities. No adverse interval change.                 [1] Past Medical History: Diagnosis Date   CVA (cerebral vascular accident)    (CMS-HCC) 04/2013   initial right sided weakness   Depression    Diabetes mellitus (CMS-HCC)    DVT (deep venous thrombosis) (CMS-HCC) 06/2013   R arm, L leg on Plavix for a period of time   Hypercholesteremia     Hypertension    Hypothyroidism    Tracheal stenosis 2015  [2] Past Surgical History: Procedure Laterality Date   FOOT SURGERY     NECK SURGERY     PR AMPUTATION LOW  LEG THRU TIB/FIB Left 04/12/2021   Procedure: AMPUTA LEG THRU TIBIA & FIBULA;  Surgeon: Lang Glean Devonshire, MD;  Location: MAIN OR [facility];  Service: Vascular   PR BRONCHOSCOPY W/TRANSBRONCL NDL ASPIR BX EA LOBE Midline 07/15/2013   Procedure: BRONCHOSCOPY, RIGID OR FLEXIBLE, W/FLUORO; W/TRANSBRONCHIAL NEEDLE ASPIRATION BIOPSY(S), EACH ADD`L LOBE;  Surgeon: Adline LOISE Blanch, MD;  Location: MAIN OR [facility];  Service: ENT   PR BRONCHOSCOPY,TRACH/BRONCH DILATN Bilateral 09/03/2013   Procedure: BRONCHOSCOPY, RIGID/FLEXIBLE, INCL FLUOROSCOPIC GUIDANCE; W/TRACHIAL/BRONCH DILATION OR CLOSED REDUCTION FX;  Surgeon: Genell LOISE Fairly, MD;  Location: MAIN OR [facility];  Service: ENT   PR CREATE T-E FISTULA+SPEECH PROSTHESIS Midline 03/30/2014   Procedure: CONSTRUCTION OF TRACHEOESOPHAGEAL FISTULA & SUBSEQUENT INSERTION OF AN ALARYNGEAL SPEECH PROSTHESIS;  Surgeon: Adline LOISE Blanch, MD;  Location: MAIN OR [facility];  Service: ENT   PR CRICOPHAYNGEAL MYOTOMY Midline 03/30/2014   Procedure: CRICOPHARYNGEAL MYOTOMY;  Surgeon: Adline LOISE Blanch, MD;  Location: MAIN OR [facility];  Service: ENT   PR DRAIN LOWER LEG DEEP ABSC/HEMATOMA Left 05/07/2021   Procedure: I&D LEG/ANK; DEEP ABSCESS/HEMATOMA;  Surgeon: Pierce Maryelizabeth Finn, MD;  Location: MAIN OR [facility];  Service: Vascular   PR ESOPHAGOSCOPY,DIAGNOSTIC Midline 07/15/2013   Procedure: ESOPHAGOSCOPY, RIGID OR FLEXIBLE; DIAGNOSTIC, W/WO COLLECTION OF SPECIMEN(S) BY BRUSHING OR WASHING;  Surgeon: Adline LOISE Blanch, MD;  Location: MAIN OR [facility];  Service: ENT   PR INCISION & DRAINAGE COMPLEX PO WOUND INFECTION Left 04/06/2023   Procedure: INCISION AND DRAINAGE, COMPLEX, POSTOPERATIVE WOUND INFECTION; FOOT;  Surgeon: Luwanna Caretha Anis, MD;  Location: OR [facility];  Service: Vascular   PR LARYNGOSCOPY,DIRCT,OP SCOP,EXC  TUMR Bilateral 09/03/2013   Procedure: LARYNGOSCOPY, DIRECT, OPERATIVE, W/EXCISION TUMOR &/OR STRIPPING VOCAL CORD/EPIGLOTTIS; W/OPERA MICRO/TELES;  Surgeon: Genell LOISE Fairly, MD;  Location: MAIN OR [facility];  Service: ENT   PR LARYNGOSCOPY,DIRCT,OP SCOPE,BIOPSY Midline 07/15/2013   Procedure: LARYNGOSCOPY, DIRECT, OPERATIVE, WITH BIOPSY; WITH OPERATING MICROSCOPE OR TELESCOPE;  Surgeon: Adline LOISE Blanch, MD;  Location: MAIN OR [facility];  Service: ENT   PR LARYNGOSCOPY,DIRCT,OP SCOPE,BIOPSY N/A 10/29/2013   Procedure: LARYNGOSCOPY, DIRECT, OPERATIVE, WITH BIOPSY; WITH OPERATING MICROSCOPE OR TELESCOPE;  Surgeon: Genell LOISE Fairly, MD;  Location: MAIN OR [facility];  Service: ENT   PR LARYNGOSCOPY,DIRECT,DX,OP MICROSCOP Midline 07/23/2013   Procedure: LARYNGOSCOPY DIRECT WITH OR WITHOUT TRACHEOSCPY; DIAGNOSTIC, WITH OPERATING MICROSCOPE OR TELESCOPE;  Surgeon: Adline LOISE Blanch, MD;  Location: MAIN OR [facility];  Service: ENT   PR REMOVAL OF LARYNX N/A 03/30/2014   Procedure: LARYNGECTOMY; TOT WO RADICAL NECK DISSECTION;  Surgeon: Adline LOISE Blanch, MD;  Location: MAIN OR [facility];  Service: ENT   PR REVISE TRACHEOSTOMY SCAR N/A 12/13/2017   Procedure: REVISION OF TRACHEOSTOMY SCAR;  Surgeon: Adline Micki Blanch, MD;  Location: MAIN OR [facility];  Service: ENT   PR TRACH REVISION,SIMPLE N/A 12/13/2017   Procedure: TRACHEOSTOMA REVIS; SIMPL WO FLAP ROTATION;  Surgeon: Adline Micki Blanch, MD;  Location: MAIN OR [facility];  Service: ENT   PR TRACH REVISION,SIMPLE N/A 04/23/2018   Procedure: TRACHEOSTOMA REVIS; SIMPL WO FLAP ROTATION;  Surgeon: Adline Micki Blanch, MD;  Location: MAIN OR [facility];  Service: ENT   PR TRACHEOSTOMY, PLANNED Midline 07/15/2013   Procedure: TRACHEOSTOMY PLANNED (SEPART PROC);  Surgeon: Adline LOISE Blanch, MD;  Location: MAIN OR [facility];  Service: ENT   PR UPPER GI ENDOSCOPY,BIOPSY N/A 08/03/2014   Procedure: UGI ENDOSCOPY; WITH BIOPSY, SINGLE OR MULTIPLE;  Surgeon: Enos LILLETTE Bean, MD;  Location: [facility];   Service: Gastroenterology   SPINAL FUSION    [  3] Family History Problem Relation Age of Onset   Diabetes Mother    Heart failure Maternal Grandfather    Anesthesia problems Neg Hx    Bleeding Disorder Neg Hx   [4] Social History Tobacco Use   Smoking status: Former    Current packs/day: 0.00    Average packs/day: 0.5 packs/day for 25.0 years (12.5 ttl pk-yrs)    Types: Cigarettes    Start date: 04/07/1988    Quit date: 04/07/2013    Years since quitting: 11.0   Smokeless tobacco: Never  Substance Use Topics   Alcohol use: Not Currently   Drug use: No   Willodean Rocky HERO, DO Resident 05/07/24 1029

## 2024-05-09 ENCOUNTER — Telehealth: Payer: Self-pay

## 2024-05-09 MED ORDER — ZOLPIDEM TARTRATE 10 MG PO TABS: 10.0000 mg | ORAL_TABLET | Freq: Every evening | ORAL | 0 refills | Status: AC | PRN

## 2024-05-09 NOTE — Telephone Encounter (Signed)
 PDMP and chart reviewed.  Of note Dr. Eapen and mention some concerns of zolpidem  misuse in last note.  PDMP appears appropriate and thus we will provide a 3-day prescription of zolpidem  and Dr. Fran can reassess upon her return.  Regarding the Auvelity  I would recommend continuing for the next 3 days until Dr. Eapen returns and can address this.  If suicidality were to become active or plan were to develop recommend reaching out to crisis or presenting to the emergency room.

## 2024-05-09 NOTE — Addendum Note (Signed)
 Addended by: CARVIN CROCK on: 05/09/2024 11:02 AM   Modules accepted: Orders

## 2024-05-09 NOTE — Telephone Encounter (Signed)
 Pt calling requesting medication refill of zolpidem  (AMBIEN ) 10 MG tablet  Sent to [id number] [place], [place] - [address]   Last seen :04/24/24 Next apt on 05/15/24 Mediation questions on his recent Change of Wellbutrin  to Auvelity  45 mg - 105 mg once daily. States  the change is making me not feel good and Some suicidal thoughts No plan, intent, or means to want to harm self or others.Advised to go to  ED stated he had no way of getting there and did not like to go , advised to call 911 or go to urgent care. Pt confirmed  and verbalized understanding.    .I let him know provider messages are also being covered buy a different provider as Dr.Eappen is not available.Pt confirmed and verbalized understanding.

## 2024-05-12 ENCOUNTER — Telehealth: Payer: Self-pay | Admitting: Psychiatry

## 2024-05-12 ENCOUNTER — Encounter: Payer: Self-pay | Admitting: Psychiatry

## 2024-05-12 DIAGNOSIS — F332 Major depressive disorder, recurrent severe without psychotic features: Secondary | ICD-10-CM

## 2024-05-12 DIAGNOSIS — F411 Generalized anxiety disorder: Secondary | ICD-10-CM

## 2024-05-12 MED ORDER — ZOLPIDEM TARTRATE 10 MG PO TABS: 10.0000 mg | ORAL_TABLET | Freq: Every evening | ORAL | 0 refills | Status: DC | PRN

## 2024-05-12 MED ORDER — CARIPRAZINE HCL 1.5 MG PO CAPS: 1.5000 mg | ORAL_CAPSULE | Freq: Every day | ORAL | 0 refills | Status: DC

## 2024-05-12 NOTE — Telephone Encounter (Signed)
 Spoke to mom, Ms. Doris, she agrees to be with Toribio at around noon so we can do a phone call at that time.

## 2024-05-12 NOTE — Telephone Encounter (Signed)
 Contacted patient by phone, mother Ms. Donia was also present during the conversation, on speaker phone. As per mother patient has been struggling due to abdominal pain, possible nausea, dry heaving since the past several weeks and got worse in the past couple of weeks.  Although they had an emergency department visit end of December for shortness of breath this was not addressed.  Patient has has been struggling emotionally as well due to the same.  He had some passive SI previously however currently denies it.  Agrees to get help if he does have any suicidality.  Mother reports she has been checking on him every day and will be able to get him help if he is in a crisis. He did not like the Auvelity  that was added last visit, did not feel good on it and hence stopped taking it. He ran out of zolpidem , there was some concerns about overuse of zolpidem  last visit.  Mother today agrees to manage his medications and fill out a pillbox 1 week at a time for the zolpidem  so he does not have access to it.  Hence we will restart zolpidem  10 mg daily, 30-day supply. Agreeable to trial of Vraylar  for management of mood symptoms.  Provided medication education.  Will order EKG at his next visit which is coming up in a few days.  Mother agrees to contact primary care provider for his current abdominal pain and other physical discomfort.

## 2024-05-12 NOTE — Telephone Encounter (Signed)
 Noted

## 2024-05-15 ENCOUNTER — Ambulatory Visit: Admitting: Psychiatry

## 2024-05-19 ENCOUNTER — Telehealth: Payer: Self-pay

## 2024-05-19 NOTE — Telephone Encounter (Signed)
 Patient left voicemail stated he has had itchingfrom his head to his toes, which he believes is due to cariprazine  (Vraylar ) 1.5 mg capsule.Stated he did not take medication today as the itching was so bad. I called pt for more clarity.  Patient reported the itching worsens after taking the medication and was severe enough to keep him awake all night scratching. Today is the first day he did not take the medication due to itching getting worse. Patient stated he has been taking Vraylar  for approximately two weeks now and the itching has progressively worsened. He denied starting any other new medications and denied use of any new soaps, lotions, or foods.

## 2024-05-19 NOTE — Telephone Encounter (Signed)
 Please let him know to stop the Vraylar  and go to the nearest urgent care for an evaluation.

## 2024-05-21 NOTE — Progress Notes (Signed)
 [facility] ADULT SPEECH THERAPY [place] OUTPATIENT SPEECH PATHOLOGY 05/19/2024   Patient Name: [name] Date of Birth:09/09/1960 Session Number: 1 Diagnosis: history of non-functional larynx s/p total laryngectomy/TEP Date of Evaluation: 05/19/24 Date of Symptom Onset: 03/27/23 Referred by: Dr. Caron Lade Reason for Referral: Evaluation for use, Fit Voice Prosthesis  ASSESSMENT:  Patient returned to clinic complaining of tracheostoma bleeding, whereby he reports that he suctioned stark red blood from stoma without airway obstruction on Friday in overnight hours into first morning hours of Saturday. He did not seek urgent or emergent medical intervention. He reports that the amount of blood from stoma filled about 1/4 of the suction container. He is seen for possible issues regarding previously identified granulation tissue in tracheal soft tissue at distal Larytube border. SLP explained that blood is sometimes seen with presence of tracheostoma due to dryness and irritation. However, SLP used Ambu NP scope for viewing area of tracheal containing prosthesis and granular formations. A small area of blood was noted on a single granular formation. No obstruction. Patient indicated that he felt much better in breathing after blood ceased. He indicated that the bleeding took place for about 2 hours. TEP prosthesis was tested and showed no leak. Mild chronic angular displacement is noted with prosthesis with mild tracheal flange angle within airway tract without obstruction or air turbulence. SLP consulted with Dr. Lade lovely recommended patient be scheduled for CTA to determine blood source. Larytube in usual position with SLP placement using crimping method. Of note,patient left Larytube out of stom for 3-4 days before this visit. Knowing caliber of stomal change witout Larytube aids decision-making  for weaning Larytube in favor of peristomal adhesive placement with training. Patient exited clinic  with verbal communication intact and 100% independence with Larytube. No fuurther targets.  PLAN:    for   SLP Alaryngeal Prosthesis Recommendations: Return to clinic PRN with prosthesis leak or malfunction  Prognosis:  Good  Positive Prognosis Rationale: Behavior, Motivation, History of compliance, Good safety awareness    Goals:  Patient and Family Goals: Maintain fluent TEP speech   STG 1: Use TEP speech fluently post TEP prosthesis change.    LTG #1: Pt will demonstrate fluent TEP speech in a variety of communicative contexts.               SUBJECTIVE:  [name] is a 64 y.o. 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 Pt seen today for TEP/tracheostoma prosthesis assessment.  Communication Preference: Verbal   Barriers to Learning: No Barriers   Hearing Exceptions: No hearing aid Prior treatment for referral reason: Yes   Comments: routine tracheostoma and prosthesis care  Pain?: No     Precautions: None   Prior Function: Independent         Lives With: Alone Past Medical History[1] Family History[2] Past Surgical History[3]  Allergies  Allergen Reactions   Pregabalin Anaphylaxis    Mouth swelling and bleeding gums   Buspirone     Makes the patient flip out Patient currently taking   Valproic Acid      Causes excessive drowsiness   Gabapentin Itching and Rash   Plavix [Clopidogrel] Rash   Social History   Tobacco Use   Smoking status: Former    Current packs/day: 0.00    Average packs/day: 0.5 packs/day for 25.0 years (12.5 ttl pk-yrs)    Types: Cigarettes    Start  date: 04/07/1988    Quit date: 04/07/2013    Years since quitting: 11.1   Smokeless tobacco: Never  Substance Use Topics   Alcohol use: Not Currently   Current Medications[4]  OBJECTIVE: Prosthesis Placement Type:  (Recheck for integrity and need for  replacement.) Prosthesis Brand: Atos Prosthesis Type: Atos, Indwelling, Vega Prosthesis Size: 22.5 FR Prosthesis Length: 10 Placement Confirmation: No leakage during water leak test, Rotation of prosthesis intact Stoma Support: HME, Peristomal attachment Stoma Hygiene - Change to Stoma Care: Lary clips TEP Support - Change to TEP accessories: TE Prosthesis brush/pipette Patient provided with: Insurance underwriter, S/SX of prosthesis failure  Education Provided: Patient, SLP Plan of Care  Patient provided with: Education materials, S/SX of prosthesis failure Response to Education: Understanding demonstrated   Session Duration : 60  I attest that I have reviewed the above information. Signed: Redell Monas, CCC-SLP 05/19/2024 5:17 PM                [1] Past Medical History: Diagnosis Date   CVA (cerebral vascular accident)    (CMS-HCC) 04/2013   initial right sided weakness   Depression    Diabetes mellitus (CMS-HCC)    DVT (deep venous thrombosis) (CMS-HCC) 06/2013   R arm, L leg on Plavix for a period of time   Hypercholesteremia    Hypertension    Hypothyroidism    Tracheal stenosis 2015  [2] Family History Problem Relation Age of Onset   Diabetes Mother    Heart failure Maternal Grandfather    Anesthesia problems Neg Hx    Bleeding Disorder Neg Hx   [3] Past Surgical History: Procedure Laterality Date   FOOT SURGERY     NECK SURGERY     PR AMPUTATION LOW LEG THRU TIB/FIB Left 04/12/2021   Procedure: AMPUTA LEG THRU TIBIA & FIBULA;  Surgeon: Lang Glean Devonshire, MD;  Location: MAIN OR [facility];  Service: Vascular   PR BRONCHOSCOPY W/TRANSBRONCL NDL ASPIR BX EA LOBE Midline 07/15/2013   Procedure: BRONCHOSCOPY, RIGID OR FLEXIBLE, W/FLUORO; W/TRANSBRONCHIAL NEEDLE ASPIRATION BIOPSY(S), EACH ADD`L LOBE;  Surgeon: Adline LOISE Blanch, MD;  Location: MAIN OR [facility];  Service: ENT   PR BRONCHOSCOPY,TRACH/BRONCH DILATN Bilateral 09/03/2013   Procedure:  BRONCHOSCOPY, RIGID/FLEXIBLE, INCL FLUOROSCOPIC GUIDANCE; W/TRACHIAL/BRONCH DILATION OR CLOSED REDUCTION FX;  Surgeon: Genell LOISE Fairly, MD;  Location: MAIN OR [facility];  Service: ENT   PR CREATE T-E FISTULA+SPEECH PROSTHESIS Midline 03/30/2014   Procedure: CONSTRUCTION OF TRACHEOESOPHAGEAL FISTULA & SUBSEQUENT INSERTION OF AN ALARYNGEAL SPEECH PROSTHESIS;  Surgeon: Adline LOISE Blanch, MD;  Location: MAIN OR [facility];  Service: ENT   PR CRICOPHAYNGEAL MYOTOMY Midline 03/30/2014   Procedure: CRICOPHARYNGEAL MYOTOMY;  Surgeon: Adline LOISE Blanch, MD;  Location: MAIN OR [facility];  Service: ENT   PR DRAIN LOWER LEG DEEP ABSC/HEMATOMA Left 05/07/2021   Procedure: I&D LEG/ANK; DEEP ABSCESS/HEMATOMA;  Surgeon: Pierce Maryelizabeth Finn, MD;  Location: MAIN OR [facility];  Service: Vascular   PR ESOPHAGOSCOPY,DIAGNOSTIC Midline 07/15/2013   Procedure: ESOPHAGOSCOPY, RIGID OR FLEXIBLE; DIAGNOSTIC, W/WO COLLECTION OF SPECIMEN(S) BY BRUSHING OR WASHING;  Surgeon: Adline LOISE Blanch, MD;  Location: MAIN OR [facility];  Service: ENT   PR INCISION & DRAINAGE COMPLEX PO WOUND INFECTION Left 04/06/2023   Procedure: INCISION AND DRAINAGE, COMPLEX, POSTOPERATIVE WOUND INFECTION; FOOT;  Surgeon: Luwanna Caretha Anis, MD;  Location: OR [facility];  Service: Vascular   PR LARYNGOSCOPY,DIRCT,OP SCOP,EXC TUMR Bilateral 09/03/2013   Procedure: LARYNGOSCOPY, DIRECT, OPERATIVE, W/EXCISION TUMOR &/OR STRIPPING VOCAL CORD/EPIGLOTTIS; W/OPERA MICRO/TELES;  Surgeon: Genell LOISE Fairly, MD;  Location: MAIN OR [facility];  Service: ENT   PR LARYNGOSCOPY,DIRCT,OP SCOPE,BIOPSY Midline 07/15/2013   Procedure: LARYNGOSCOPY, DIRECT, OPERATIVE, WITH BIOPSY; WITH OPERATING MICROSCOPE OR TELESCOPE;  Surgeon: Adline LOISE Blanch, MD;  Location: MAIN OR [facility];  Service: ENT   PR LARYNGOSCOPY,DIRCT,OP SCOPE,BIOPSY N/A 10/29/2013   Procedure: LARYNGOSCOPY, DIRECT, OPERATIVE, WITH BIOPSY; WITH OPERATING MICROSCOPE OR TELESCOPE;  Surgeon: Genell LOISE Fairly, MD;  Location: MAIN OR [facility];  Service: ENT    PR LARYNGOSCOPY,DIRECT,DX,OP MICROSCOP Midline 07/23/2013   Procedure: LARYNGOSCOPY DIRECT WITH OR WITHOUT TRACHEOSCPY; DIAGNOSTIC, WITH OPERATING MICROSCOPE OR TELESCOPE;  Surgeon: Adline LOISE Blanch, MD;  Location: MAIN OR [facility];  Service: ENT   PR REMOVAL OF LARYNX N/A 03/30/2014   Procedure: LARYNGECTOMY; TOT WO RADICAL NECK DISSECTION;  Surgeon: Adline LOISE Blanch, MD;  Location: MAIN OR [facility];  Service: ENT   PR REVISE TRACHEOSTOMY SCAR N/A 12/13/2017   Procedure: REVISION OF TRACHEOSTOMY SCAR;  Surgeon: Adline Micki Blanch, MD;  Location: MAIN OR [facility];  Service: ENT   PR TRACH REVISION,SIMPLE N/A 12/13/2017   Procedure: TRACHEOSTOMA REVIS; SIMPL WO FLAP ROTATION;  Surgeon: Adline Micki Blanch, MD;  Location: MAIN OR [facility];  Service: ENT   PR TRACH REVISION,SIMPLE N/A 04/23/2018   Procedure: TRACHEOSTOMA REVIS; SIMPL WO FLAP ROTATION;  Surgeon: Adline Micki Blanch, MD;  Location: MAIN OR [facility];  Service: ENT   PR TRACHEOSTOMY, PLANNED Midline 07/15/2013   Procedure: TRACHEOSTOMY PLANNED (SEPART PROC);  Surgeon: Adline LOISE Blanch, MD;  Location: MAIN OR [facility];  Service: ENT   PR UPPER GI ENDOSCOPY,BIOPSY N/A 08/03/2014   Procedure: UGI ENDOSCOPY; WITH BIOPSY, SINGLE OR MULTIPLE;  Surgeon: Enos LILLETTE Bean, MD;  [facility];  Service: Gastroenterology   SPINAL FUSION    [4] Current Outpatient Medications  Medication Sig Dispense Refill   acetaminophen  (TYLENOL ) 325 MG tablet Take 2 tablets (650 mg total) by mouth every six (6) hours as needed for pain. 30 tablet 0   amLODIPine  (NORVASC ) 10 MG tablet Take 1 tablet (10 mg total) by mouth daily. 90 tablet 0   aspirin  (ECOTRIN) 81 MG tablet Take 1 tablet (81 mg total) by mouth daily.     budesonide  (PULMICORT ) 0.5 mg/2 mL nebulizer solution Inhale 2 mL (0.5 mg total) by nebulization daily. 60 mL 11   buPROPion  (WELLBUTRIN  XL) 300 MG 24 hr tablet Take 1 tablet (300 mg total) by mouth every morning.     chlorthalidone   (HYGROTON ) 25 MG tablet Take 1 tablet (25 mg total) by mouth daily. (Patient not taking: Reported on 04/08/2024) 30 tablet 0   ciprofloxacin-dexAMETHasone (CIPRODEX) 0.3-0.1 % otic suspension Apply 5 drops into the lary stoma twice daily. Please place these drops without the Lary tube in place and replace the lary tube once finished. 12 mL 0   ELIQUIS  5 mg Tab Take 1 tablet (5 mg total) by mouth two (2) times a day.     empagliflozin  (JARDIANCE ) 25 mg tablet Take 0.5 tablets (12.5 mg total) by mouth daily. (Patient not taking: Reported on 04/08/2024)     insulin  lispro (HUMALOG) 100 unit/mL injection pen Inject 8 Units under the skin Three (3) times a day before meals. Also use as sliding scale and inject additional insulin  based on blood glucose vales: BG 150-189: 1 unit; BG 190-229: 2 units; BG 230-269: 3 units; BG 270-309: 4 units; BG 310-349: 5 units; BG: 350-389: 6 units; BG >390: 7 units 15 mL 0   insulin  NPH isoph U-100 human (HUMULIN) 100 unit/mL (  3 mL) injection pen Inject 20 Units under the skin two (2) times a day. (Patient not taking: Reported on 12/04/2023) 12 mL 0   levothyroxine  (SYNTHROID ) 75 MCG tablet Take 1 tablet (75 mcg total) by mouth daily. 30 tablet 0   losartan  (COZAAR ) 100 MG tablet Take 1 tablet (100 mg total) by mouth daily.     methocarbamol (ROBAXIN) 500 MG tablet Take 1 tablet (500 mg total) by mouth two (2) times a day. (Patient not taking: Reported on 04/08/2024)     metoprolol  succinate (TOPROL -XL) 25 MG 24 hr tablet Take 1 tablet (25 mg total) by mouth daily.     multivitamins, therapeutic with minerals 9 mg iron-400 mcg tablet Take 1 tablet by mouth daily. 30 tablet 0   nortriptyline  (PAMELOR ) 25 MG capsule Take 1 capsule (25 mg total) by mouth nightly. (Patient not taking: Reported on 12/04/2023)     OLANZapine  (ZYPREXA ) 5 MG tablet Take 1 tablet (5 mg total) by mouth nightly. (Patient not taking: Reported on 04/08/2024)     pantoprazole  (PROTONIX ) 40 MG  tablet Take 1 tablet (40 mg total) by mouth Two (2) times a day (30 minutes before a meal).     pen needle, diabetic 32 gauge x 5/32 (4 mm) Ndle Use with insulin  up to 4 times/day as needed. 100 each 0   rosuvastatin  (CRESTOR ) 20 MG tablet Take 1 tablet (20 mg total) by mouth in the morning.     sodium chloride  irrigation (NS) 0.9 % irrigation Irrigate with 1,000 mL as directed in the morning. - Mix 0.5mL of ciprodex suspension with 4mL of normal saline solution - Administer this solution through home nebulizer 1 time per day for 7 days - Discontinue if any adverse symptoms occur like rash, thrush, trouble breathing, shortness of breath, or other concerning symptoms.. (Patient not taking: Reported on 12/04/2023) 500 mL 0   telmisartan (MICARDIS) 80 MG tablet Take 1 tablet (80 mg total) by mouth daily.     zolpidem  (AMBIEN ) 10 mg tablet Take 1 tablet (10 mg total) by mouth nightly.     No current facility-administered medications for this encounter.

## 2024-05-22 ENCOUNTER — Ambulatory Visit: Payer: Self-pay | Admitting: Psychiatry

## 2024-05-22 ENCOUNTER — Encounter: Payer: Self-pay | Admitting: Psychiatry

## 2024-05-22 VITALS — BP 126/76 | HR 70 | Temp 97.8°F | Ht 72.0 in | Wt 250.0 lb

## 2024-05-22 DIAGNOSIS — F3341 Major depressive disorder, recurrent, in partial remission: Secondary | ICD-10-CM

## 2024-05-22 DIAGNOSIS — F411 Generalized anxiety disorder: Secondary | ICD-10-CM

## 2024-05-22 DIAGNOSIS — Z91148 Patient's other noncompliance with medication regimen for other reason: Secondary | ICD-10-CM

## 2024-05-22 DIAGNOSIS — F5101 Primary insomnia: Secondary | ICD-10-CM

## 2024-05-22 MED ORDER — ZOLPIDEM TARTRATE 10 MG PO TABS: 10.0000 mg | ORAL_TABLET | Freq: Every evening | ORAL | 2 refills | Status: AC | PRN

## 2024-05-22 NOTE — Progress Notes (Unsigned)
 BH MD OP Progress Note  05/22/2024 2:06 PM [name]  MRN:  [id number]  Chief Complaint:  Chief Complaint  Patient presents with   Medication Refill   Follow-up   Anxiety   Depression   Insomnia   Discussed the use of AI scribe software for clinical note transcription with the patient, who gave verbal consent to proceed.  History of Present Illness [name] is a 64 year old Caucasian male, widowed, lives in [place], has a history of MDD, GAD, primary insomnia, bereavement, history of tracheal stenosis status post multiple intubation, respiratory failure due to overdose of opioids, history of laryngectomy, history of CVA with right-sided hemiparesis, diabetes mellitus status post below-knee amputation, left sided, stage III chronic kidney disease, hypertension, NSTEMI, hypothyroidism, chronic pain, history of COVID-19 infection was evaluated in office today for a follow-up appointment. He is accompanied by his mother.  Collateral information obtained from mother who was present in session today.  He describes ongoing depressive symptoms that fluctuate daily, noting periods of feeling in good spirits and other times feeling less well. Recently, he has noticed an improvement in mood and states he feels pretty good today and over the past couple of days, though he remains uncertain about the reason for this change. According to his mother, his mood varies, and she observed that he sounded so normal and in good spirits last night .  Persistent thoughts of not wanting to be alive continue, and he reports experiencing these thoughts all the time and not wanting to go through the same struggles daily. He clarifies that he does not plan or attempt to end his life and does not have a specific plan or intent.  He continues to take escitalopram  (Lexapro ) for depression.  He denies any side effects to it.  He however had to stop the Vraylar  which was recently added for his mood symptoms  due to itching and rash.  Zolpidem  remains part of his regimen for sleep, with his mother managing the medication to ensure he does not take more than prescribed. He reports sleeping well last night for the first time in about 7 days.  Recent physical limitations due to sores on his leg have kept him from wearing his prosthesis until recently. Today, he was able to walk for the first time in a while, wearing the prosthesis for about 30 minutes yesterday and again today, but he limits use as his providers advised.  He reports limited social outings, primarily leaving home for medical appointments. Occasionally, friends visit, and he plans to attend a friend's mother's funeral. His brother is visiting this weekend. He attended a family gathering at Christmas with assistance to enter the house. He attempted to drive but was unable due to car battery issues. He does not report regular physical activity and denies regular sunlight exposure.   Visit Diagnosis:    ICD-10-CM   1. Recurrent major depressive disorder, in partial remission  F33.41 zolpidem  (AMBIEN ) 10 MG tablet    2. GAD (generalized anxiety disorder)  F41.1 clonazePAM  (KLONOPIN ) 1 MG tablet    3. Primary insomnia  F51.01     4. Overuse of medication  Z91.148       Past Psychiatric History: I have reviewed past psychiatric history from progress note on 01/15/2019.  Past trials of medications like fluoxetine, Rexulti , Paxil, Celexa , Lexapro , Wellbutrin , Zyprexa , Depakote , Xanax , Abilif,Auvelity ,Vraylar . Recent behavioral health [facility] geriatric unit-03/26/2024 - 04/04/2024. Completed TMS twice in 2021, 2024. History of ECT-did not tolerate. Multiple inpatient  behavioral health admissions in the past-2020.  Possible remote suicide attempt several years ago when he tried to overdose on bug spray as well as had plan to jump in front of a train although he did not go through with it.  History of overdose on opioids in 2014 however  reports it was not a suicide attempt and that he was trying to get high and it was accidental overdose.  Past Medical History:  Past Medical History:  Diagnosis Date   Depression    Diabetes (HCC)    Insulin  Pump   Diabetes mellitus type I (HCC)    Diabetes mellitus without complication (HCC)    GERD (gastroesophageal reflux disease)    H/O laryngectomy    Heel bone fracture    History of embolic stroke    Hyperlipidemia    Hypertension    Radicular pain of right lower extremity    Stroke (HCC)    Suicide attempt (HCC) 2014   damaged larynx - tracheostomy   Thyroid  disease     Past Surgical History:  Procedure Laterality Date   COLONOSCOPY WITH PROPOFOL  N/A 05/15/2018   Procedure: COLONOSCOPY WITH PROPOFOL ;  Surgeon: [name], Ladell POUR, MD;  Location: [facility] ENDOSCOPY;  Service: Gastroenterology;  Laterality: N/A;   ESOPHAGOGASTRODUODENOSCOPY N/A 05/15/2018   Procedure: ESOPHAGOGASTRODUODENOSCOPY (EGD);  Surgeon: [name], Ladell POUR, MD;  Location: [facility] ENDOSCOPY;  Service: Gastroenterology;  Laterality: N/A;   FRACTURE SURGERY     Heel bone reconstruction Left    HERNIA REPAIR  02/2011   Umbilical hernia repair    LARYNGECTOMY     LOWER EXTREMITY ANGIOGRAPHY Left 01/31/2021   Procedure: LOWER EXTREMITY ANGIOGRAPHY;  Surgeon: Marea Selinda RAMAN, MD;  Location: [facility] INVASIVE CV LAB;  Service: Cardiovascular;  Laterality: Left;   LOWER EXTREMITY ANGIOGRAPHY Right 02/14/2021   Procedure: LOWER EXTREMITY ANGIOGRAPHY;  Surgeon: Marea Selinda RAMAN, MD;  Location: [facility] INVASIVE CV LAB;  Service: Cardiovascular;  Laterality: Right;   NECK SURGERY     fusion   SPINE SURGERY     TRACHEOSTOMY  2014   from SI attempt    Family Psychiatric History: I have reviewed family psychiatric history from progress note on 01/15/2019.  Family History:  Family History  Problem Relation Age of Onset   Osteoporosis Mother    Diabetes Mother    Hypertension Father    Mental illness Neg Hx     Social History: I  have reviewed social history from progress note on 01/15/2019. Social History   Socioeconomic History   Marital status: Widowed    Spouse name: [name]   Number of children: Not on file   Years of education: Not on file   Highest education level: Not on file  Occupational History   Not on file  Tobacco Use   Smoking status: Former    Current packs/day: 0.00    Types: Cigarettes    Quit date: 04/09/2013    Years since quitting: 11.1   Smokeless tobacco: Never  Vaping Use   Vaping status: Never Used  Substance and Sexual Activity   Alcohol use: Yes    Alcohol/week: 2.0 standard drinks of alcohol    Types: 2 Shots of liquor per week   Drug use: No    Comment: Pt denied; UDS not available   Sexual activity: Yes    Partners: Female    Birth control/protection: Condom  Other Topics Concern   Not on file  Social History Narrative   Not on file   Social  Drivers of Health   Tobacco Use: Medium Risk (05/22/2024)   Patient History    Smoking Tobacco Use: Former    Smokeless Tobacco Use: Never    Passive Exposure: Not on file  Financial Resource Strain: Low Risk  (12/19/2023)   [facility] from [facility]   Overall Financial Resource Strain (CARDIA)    Difficulty of Paying Living Expenses: Not hard at all  Food Insecurity: No Food Insecurity (03/26/2024)   Epic    Worried About Programme Researcher, Broadcasting/film/video in the Last Year: Never true    Ran Out of Food in the Last Year: Never true  Transportation Needs: No Transportation Needs (03/26/2024)   Epic    Lack of Transportation (Medical): No    Lack of Transportation (Non-Medical): No  Physical Activity: Sufficiently Active (07/12/2023)   Exercise Vital Sign    Days of Exercise per Week: 7 days    Minutes of Exercise per Session: 30 min  Stress: Stress Concern Present (07/12/2023)   [employer] of Occupational Health - Occupational Stress Questionnaire    Feeling of Stress : Very much  Social Connections: Socially  Isolated (07/12/2023)   Social Connection and Isolation Panel    Frequency of Communication with Friends and Family: More than three times a week    Frequency of Social Gatherings with Friends and Family: Never    Attends Religious Services: Never    Database Administrator or Organizations: No    Attends Banker Meetings: Never    Marital Status: Widowed  Depression (PHQ2-9): High Risk (05/22/2024)   Depression (PHQ2-9)    PHQ-2 Score: 20  Alcohol Screen: Low Risk (03/26/2024)   Alcohol Screen    Last Alcohol Screening Score (AUDIT): 0  Housing: Low Risk (03/26/2024)   Epic    Unable to Pay for Housing in the Last Year: No    Number of Times Moved in the Last Year: 0    Homeless in the Last Year: No  Utilities: Not At Risk (03/26/2024)   Epic    Threatened with loss of utilities: No  Health Literacy: Adequate Health Literacy (07/12/2023)   B1300 Health Literacy    Frequency of need for help with medical instructions: Never    Allergies: Allergies[1]  Metabolic Disorder Labs: Lab Results  Component Value Date   HGBA1C 7.8 (H) 03/25/2024   MPG 177.16 03/25/2024   MPG 165.68 10/28/2022   No results found for: PROLACTIN Lab Results  Component Value Date   CHOL 161 09/15/2016   TRIG 190 (H) 09/15/2016   HDL 41 09/15/2016   CHOLHDL 3.9 09/15/2016   VLDL 38 09/15/2016   LDLCALC 82 09/15/2016   Lab Results  Component Value Date   TSH 2.800 04/02/2024   TSH 1.952 09/15/2016    Therapeutic Level Labs: No results found for: LITHIUM Lab Results  Component Value Date   VALPROATE 19 (L) 09/12/2016   No results found for: CBMZ  Current Medications: Current Outpatient Medications  Medication Sig Dispense Refill   budesonide  (PULMICORT ) 0.5 MG/2ML nebulizer solution Inhale 0.5 mg into the lungs.     ciprofloxacin-dexamethasone (CIPRODEX) OTIC suspension Apply 5 drops into the lary stoma twice daily. Please place these drops without the Lary tube in place  and replace the lary tube once finished.     amLODipine  (NORVASC ) 5 MG tablet Take 1 tablet (5 mg total) by mouth daily. 30 tablet 1   apixaban  (ELIQUIS ) 5 MG TABS tablet Take 1 tablet (  5 mg total) by mouth 2 (two) times daily. 60 tablet 5   chlorthalidone  (HYGROTON ) 25 MG tablet Take 25 mg by mouth daily.     clonazePAM  (KLONOPIN ) 1 MG tablet Take 1 tablet (1 mg total) by mouth daily as needed (anxiety). 30 tablet 1   Continuous Glucose Sensor (DEXCOM G7 SENSOR) MISC Use 1 each every 10 (ten) days     dicyclomine  (BENTYL ) 20 MG tablet Take 1 tablet (20 mg total) by mouth 3 (three) times daily before meals. 90 tablet 0   diphenhydrAMINE -zinc  acetate (BENADRYL ) cream Apply topically 3 (three) times daily as needed for itching (for effected area on right arm). 28.4 g 0   escitalopram  (LEXAPRO ) 5 MG tablet Take 3 tablets (15 mg total) by mouth daily. 90 tablet 0   HUMALOG 100 UNIT/ML injection Inject 20-120 Units into the skin daily. Uses with Insulin  Pump     hydrocortisone  cream 1 % Apply topically 2 (two) times daily. 30 g 0   insulin  aspart (NOVOLOG ) 100 UNIT/ML injection Inject 12 Units into the skin 3 (three) times daily with meals. 10 mL 11   insulin  glargine-yfgn (SEMGLEE ) 100 UNIT/ML injection Inject 0.22 mLs (22 Units total) into the skin 2 (two) times daily. 10 mL 11   levothyroxine  (SYNTHROID ) 75 MCG tablet TAKE 1 TABLET BY MOUTH DAILY AT 6 AM 90 tablet 0   metoprolol  succinate (TOPROL -XL) 25 MG 24 hr tablet Take 1 tablet (25 mg total) by mouth daily. 30 tablet 0   mometasone  (ELOCON ) 0.1 % cream Apply to rash on arms and chest twice daily until improved. 45 g 1   nystatin  cream (MYCOSTATIN ) Apply topically 2 (two) times daily. 30 g 0   ondansetron  (ZOFRAN -ODT) 4 MG disintegrating tablet Take 4 mg by mouth every 8 (eight) hours as needed for nausea or vomiting. (Patient not taking: Reported on 03/25/2024)     pantoprazole  (PROTONIX ) 40 MG tablet TAKE 1 TABLET(40 MG) BY MOUTH TWICE DAILY      rosuvastatin  (CRESTOR ) 20 MG tablet Take 20 mg by mouth at bedtime.     zolpidem  (AMBIEN ) 10 MG tablet Take 1 tablet (10 mg total) by mouth at bedtime as needed for sleep. 3 tablet 0   [START ON 06/10/2024] zolpidem  (AMBIEN ) 10 MG tablet Take 1 tablet (10 mg total) by mouth at bedtime as needed for sleep. 30 tablets must last 30 days 30 tablet 2   No current facility-administered medications for this visit.     Musculoskeletal: Strength & Muscle Tone: wnl Gait & Station: walks with prosthetic leg , walker Patient leans: N/A  Psychiatric Specialty Exam: Review of Systems  Psychiatric/Behavioral:  Positive for sleep disturbance (improving). The patient is nervous/anxious.     Blood pressure 126/76, pulse 70, temperature 97.8 F (36.6 C), temperature source Temporal, height 6' (1.829 m), weight 250 lb (113.4 kg), SpO2 99%.Body mass index is 33.91 kg/m.  General Appearance: Casual  Eye Contact:  Fair  Speech:  Clear and Coherent  Volume:  Normal  Mood:  Anxious  Affect:  Appropriate  Thought Process:  Goal Directed and Descriptions of Associations: Intact  Orientation:  Full (Time, Place, and Person)  Thought Content: Logical   Suicidal Thoughts:  No reports chronic thoughts of not wanting to exist due to his medical illness, physical limitations, however currently denies any active intent or plan  Homicidal Thoughts:  No  Memory:  Immediate;   Fair Recent;   Fair Remote;   Fair  Judgement:  Fair  Insight:  Fair  Psychomotor Activity:  Normal  Concentration:  Concentration: Fair and Attention Span: Fair  Recall:  [employer] of Knowledge: Fair  Language: Fair  Akathisia:  No  Handed:  Right  AIMS (if indicated): denies side effects of tremors and abnormal movements  Assets:  Communication Skills Desire for Improvement Housing Social Support Transportation  ADL's:  Intact  Cognition: WNL  Sleep:  improving   Screenings: AIMS    Flowsheet Row Office Visit from  03/25/2024 in [facility] Office Visit from 09/26/2023 in [facility] Visit from 12/28/2022 in [facility] Office Visit from 04/05/2022 in [facility] Video Visit from 10/31/2021 in [facility]  AIMS Total Score 0 0 0 0 0   AUDIT    Flowsheet Row Admission (Discharged) from 01/22/2019 in [facility] BE[facility] Admission (Discharged) from 12/31/2018 in [facility] INPATIENT BEHAVIORAL MEDICINE Admission (Discharged) from 09/14/2016 in [facility] INPATIENT BEHAVIORAL MEDICINE  Alcohol Use Disorder Identification Test Final Score (AUDIT) 0 0 1   ECT-MADRS    Flowsheet Row Admission (Discharged) from 01/22/2019 in [facility] INPATIENT BEHAVIORAL MEDICINE  MADRS Total Score 27   GAD-7    Flowsheet Row Office Visit from 05/22/2024 in [facility] Office Visit from 03/25/2024 in [facility] Office Visit from 07/23/2023 in [facility] Counselor from 07/12/2023 in [facility] Office Visit from 03/20/2023 in [facility]  Total GAD-7 Score 7 11 2 17 8    Mini-Mental    Flowsheet Row Admission (Discharged) from 01/22/2019 in [facility] INPATIENT BEHAVIORAL MEDICINE  Total Score (max 30 points ) 30   PHQ2-9    Flowsheet Row Office Visit from 05/22/2024 in [facility] Office Visit from 03/25/2024 in [facility] Counselor from 11/15/2023 in [facility] Office Visit from 09/26/2023 in [facility] Office Visit from 07/23/2023 in [facility]  PHQ-2 Total Score 6 6 1 2 2   PHQ-9 Total Score 20 22 6 6 6    Flowsheet Row Office Visit from 05/22/2024 in [facility] Office Visit from 04/24/2024 in [facility] Admission (Discharged) from 03/26/2024 in [facility] BEHAVIORAL MEDICINE  C-SSRS RISK CATEGORY Moderate Risk Moderate Risk No Risk     Assessment and Plan: [name] is a 64 year old male, presented for a follow-up appointment, discussed assessment and plan as noted below.  1. Recurrent major depressive disorder, in partial remission Currently reports improvement in depression symptoms.  Continues to have ongoing thoughts of not wanting to exist although currently denies any active suicidality or intent. Continue Lexapro  15 mg daily Discontinue Vraylar  due to side effects. Discussed addition of Caplyta, patient declines at this time stating he feels much better now. Discussed light therapy provided information for 10,000 Lux, 20 to 30 minutes in the morning. Previously completed TMS multiple times. Previous discussions for referral to IOP/PHP/individual therapy-patient declined.  2. GAD (generalized anxiety disorder)-improving Ongoing anxiety symptoms but with much improvement. Continue Lexapro  15 mg daily Continue Clonazepam  1 mg daily as needed Reviewed [place] PMP AWARxE   3. Primary insomnia-improving Currently reports sleep is improved Continue Zolpidem  10 mg at bedtime Currently medication is being  managed by mother who assist with setting up pillbox and monitoring it.  Previous history of overuse.  4. Overuse of medication-improving Will monitor closely.  Collateral information obtained from mother who reports an improvement in mood in the past couple of days.  Crisis plan discussed with patient as well as mother.  Provided information for mobile crisis.  Follow-up Follow-up in clinic in 4 to 6 weeks or  sooner if needed.  Consent: Patient/Guardian gives verbal consent for treatment and assignment of benefits for services provided during this visit. Patient/Guardian expressed understanding and agreed to proceed.  This note was generated in part or whole with voice recognition software. Voice recognition is usually quite accurate but there are transcription errors that can and very often do occur. I apologize for any typographical errors that were not detected and corrected.     Sipriano Fendley, MD 05/23/2024, 1:00 PM     [1]  Allergies Allergen Reactions   Pregabalin Other (See Comments), Anaphylaxis and Swelling    Gum Bleeding  Mouth/ gums swelling and bleeding  Mouth swelling and bleeding gums   Buspar [Buspirone]     Makes the patient flip out   Depakote  [Valproic Acid ]     Causes excessive drowsiness   Clopidogrel Rash   Gabapentin Itching and Rash   Vraylar  [Cariprazine ] Rash

## 2024-05-22 NOTE — Patient Instructions (Addendum)
 " Mobil crisis -If in a crisis please call the number.   Call  [phone]         Call [phone]   VERILUX HAPPY LIGHT , 10,000 Lux for depression ( Light therapy)  - 20 - 30 minutes in the morning .     Lumateperone Capsules What is this medication? LUMATEPERONE (LOO ma TEP e rone) treats schizophrenia and bipolar depression. It works by balancing the levels of dopamine and serotonin in your brain, substances that help regulate mood, behaviors, and thoughts. It belongs to a group of medications called antipsychotics. Antipsychotic medications can be used to treat several kinds of mental health conditions. This medicine may be used for other purposes; ask your health care provider or pharmacist if you have questions. COMMON BRAND NAME(S): CAPLYTA What should I tell my care team before I take this medication? They need to know if you have any of these conditions: Dementia Diabetes Difficulty swallowing Have trouble controlling your muscles Heart disease High cholesterol History of stroke Kidney disease Liver disease Low blood counts, like low white cell, platelet, or red cell counts Low blood pressure Parkinson's disease Seizures Suicidal thoughts, plans, or attempt; a previous suicide attempt by you or a family member An unusual or allergic reaction to lumateperone, other medications, foods, dyes, or preservatives Pregnant or trying to get pregnant Breast-feeding How should I use this medication? Take this medication by mouth with water. Take it as directed on the prescription label at the same time every day. You can take it with or without food. If it upsets your stomach, take it with food. Keep taking this medication unless your care team tells you to stop. Stopping it too quickly can cause serious side effects. It can also make your condition worse. A special MedGuide will be given to you by the pharmacist with each prescription and refill. Be sure to read this  information carefully each time. Talk to your care team about the use of this medication in children. Special care may be needed. Overdosage: If you think you have taken too much of this medicine contact a poison control center or emergency room at once. NOTE: This medicine is only for you. Do not share this medicine with others. What if I miss a dose? If you miss a dose, take it as soon as you can. If it is almost time for your next dose, take only that dose. Do not take double or extra doses. What may interact with this medication? Do not take this medication with any of the following: Metoclopramide This medication may also interact with the following: Antihistamines for allergy, cough, and cold Aprepitant Armodafinil Bosentan Carbamazepine Certain antibiotics like ciprofloxacin, clarithromycin, erythromycin, nafcillin Certain antifungals like fluconazole, itraconazole, voriconazole Certain antivirals for HIV or hepatitis Certain medications for anxiety or sleep Certain medications for depression like amitriptyline, fluoxetine, nefazodone, sertraline Cyclosporine Fluvoxamine  General anesthetics like halothane, isoflurane, methoxyflurane, propofol  Grapefruit juice Levodopa or other medications for Parkinson's disease Medications for blood pressure Medications for seizures like phenytoin Medications that relax muscles for surgery Modafinil Narcotic medications for pain Phenothiazines like chlorpromazine, prochlorperazine, thioridazine Pioglitazone Prednisone Probenecid Rifampin St. John's Wort Valproic acid  This list may not describe all possible interactions. Give your health care provider a list of all the medicines, herbs, non-prescription drugs, or dietary supplements you use. Also tell them if you smoke, drink alcohol, or use illegal drugs. Some items may interact with your medicine. What should I watch for while using this medication? Visit your  care team for regular  checks on your progress. Tell your care team if symptoms do not start to get better or if they get worse. Do not suddenly stop taking this medication. You may develop a severe reaction. Your care team will tell you how much medication to take. If your care team wants you to stop the medication, the dose may be slowly lowered over time to avoid any side effects. Watch for new or worsening thoughts of suicide or depression. This includes sudden changes in mood, behavior, or thoughts. These changes can happen at any time but are more common in the beginning of treatment or after a change in dose. Call your care team right away if you experience these thoughts or worsening depression. This medication may increase blood sugar. The risk may be higher in patients who already have diabetes. Ask your care team what you can do to lower your risk of diabetes while taking this medication. This medication may affect your coordination, reaction time, or judgment. Do not drive or operate machinery until you know how this medication affects you. Sit up or stand slowly to reduce the risk of dizzy or fainting spells. Drinking alcohol with this medication can increase the risk of these side effects. This medication can cause problems with controlling your body temperature. It can lower the response of your body to cold temperatures. If possible, stay indoors during cold weather. If you must go outdoors, wear warm clothes. It can also lower the response of your body to heat. Do not overheat. Do not over-exercise. Stay out of the sun when possible. If you must be in the sun, wear cool clothing. Drink plenty of water. If you have trouble controlling your body temperature, call your care team right away. Talk to your care team before breastfeeding. Changes to your treatment plan may be needed. This medication may cause infertility. Talk to your care team if you are concerned about your fertility. What side effects may I notice from  receiving this medication? Side effects that you should report to your care team as soon as possible: Allergic reactions--skin rash, itching, hives, swelling of the face, lips, tongue, or throat High blood sugar (hyperglycemia)--increased thirst or amount of urine, unusual weakness or fatigue, blurry vision High fever, stiff muscles, increased sweating, fast or irregular heartbeat, and confusion, which may be signs of neuroleptic malignant syndrome Infection--fever, chills, cough, or sore throat Low blood pressure--dizziness, feeling faint or lightheaded, blurry vision Pain or trouble swallowing Seizures Stroke--sudden numbness or weakness of the face, arm, or leg, trouble speaking, confusion, trouble walking, loss of balance or coordination, dizziness, severe headache, change in vision Thoughts of suicide or self-harm, worsening mood, feelings of depression Uncontrolled and repetitive body movements, muscle stiffness or spasms, tremors or shaking, loss of balance or coordination, restlessness, shuffling walk, which may be signs of extrapyramidal symptoms (EPS) Side effects that usually do not require medical attention (report these to your care team if they continue or are bothersome): Dizziness Drowsiness Dry mouth Headache Nausea Weight gain This list may not describe all possible side effects. Call your doctor for medical advice about side effects. You may report side effects to FDA at 1-800-FDA-1088. Where should I keep my medication? Keep out of the reach of children and pets. Store at room temperature between 20 and 25 degrees C (68 and 77 degrees F). Get rid of any unused medication after the expiration date. To get rid of medications that are no longer needed or have expired:  Take the medication to a medication take-back program. Check with your pharmacy or law enforcement to find a location. If you cannot return the medication, check the label or package insert to see if the  medication should be thrown out in the garbage or flushed down the toilet. If you are not sure, ask your care team. If it is safe to put it in the trash, take the medication out of the container. Mix the medication with cat litter, dirt, coffee grounds, or other unwanted substance. Seal the mixture in a bag or container. Put it in the trash. NOTE: This sheet is a summary. It may not cover all possible information. If you have questions about this medicine, talk to your doctor, pharmacist, or health care provider.  2024 Elsevier/Gold Standard (2021-11-11 00:00:00) "

## 2024-05-23 MED ORDER — CLONAZEPAM 1 MG PO TABS: 1.0000 mg | ORAL_TABLET | Freq: Every day | ORAL | 1 refills | Status: AC | PRN

## 2024-05-27 NOTE — Procedures (Signed)
"    VENOUS ACCESS ULTRASOUND PROCEDURE NOTE  Indications:   Poor venous access.  The Venous Access Team has assessed this patient for the placement of a PIV. Ultrasound guidance was necessary to obtain access.   Procedure Details: Identity of the patient was confirmed via name, medical record number and date of birth. The availability of the correct equipment was verified.  The vein was identified for ultrasound catheter insertion.  Field was prepared with necessary supplies and equipment.  Probe cover and sterile gel utilized.  Insertion site was prepped with chlorhexidine solution and allowed to dry.  The catheter extension was primed with normal saline. A(n) 20 gauge 2.25 catheter was placed in the R Upper Arm with 1attempt(s). See LDA for additional details.  Catheter aspirated, 3 mL blood return present. The catheter was then flushed with 10 mL of normal saline. Insertion site cleansed, and dressing applied per manufacturer guidelines. The catheter was inserted without difficulty by Tuesday Jackson, RN.  Primary RN was notified.   Thank you,   Tuesday Leonce, RN Venous Access Team  [phone]   Workup / Procedure Time:  30 minutes  See vein image in PACs.  Please follow [facility] guidelines for long PIV, deep vein medication contraindications for infusates.  [email or web address] Administration of Non-Antineoplastic Medications via Midline [email or web address]=%[email or web address]%2FClinical Guidelines    "

## 2024-06-12 ENCOUNTER — Emergency Department
Admission: EM | Admit: 2024-06-12 | Discharge: 2024-06-12 | Disposition: A | Source: Home / Self Care | Attending: Emergency Medicine | Admitting: Emergency Medicine

## 2024-06-12 ENCOUNTER — Ambulatory Visit: Admitting: Psychiatry

## 2024-06-12 ENCOUNTER — Emergency Department

## 2024-06-12 ENCOUNTER — Other Ambulatory Visit: Payer: Self-pay

## 2024-06-12 DIAGNOSIS — R0602 Shortness of breath: Secondary | ICD-10-CM

## 2024-06-12 DIAGNOSIS — J9500 Unspecified tracheostomy complication: Secondary | ICD-10-CM

## 2024-06-12 DIAGNOSIS — J0410 Acute tracheitis without obstruction: Secondary | ICD-10-CM

## 2024-06-12 LAB — CBC WITH DIFFERENTIAL/PLATELET
Abs Immature Granulocytes: 0.05 10*3/uL (ref 0.00–0.07)
Basophils Absolute: 0.1 10*3/uL (ref 0.0–0.1)
Basophils Relative: 1 %
Eosinophils Absolute: 0.2 10*3/uL (ref 0.0–0.5)
Eosinophils Relative: 3 %
HCT: 49.1 % (ref 39.0–52.0)
Hemoglobin: 16.5 g/dL (ref 13.0–17.0)
Immature Granulocytes: 1 %
Lymphocytes Relative: 18 %
Lymphs Abs: 1.7 10*3/uL (ref 0.7–4.0)
MCH: 30 pg (ref 26.0–34.0)
MCHC: 33.6 g/dL (ref 30.0–36.0)
MCV: 89.3 fL (ref 80.0–100.0)
Monocytes Absolute: 0.8 10*3/uL (ref 0.1–1.0)
Monocytes Relative: 9 %
Neutro Abs: 6.5 10*3/uL (ref 1.7–7.7)
Neutrophils Relative %: 68 %
Platelets: 352 10*3/uL (ref 150–400)
RBC: 5.5 MIL/uL (ref 4.22–5.81)
RDW: 12.7 % (ref 11.5–15.5)
WBC: 9.3 10*3/uL (ref 4.0–10.5)
nRBC: 0 % (ref 0.0–0.2)

## 2024-06-12 LAB — RESP PANEL BY RT-PCR (RSV, FLU A&B, COVID)  RVPGX2
Influenza A by PCR: NEGATIVE
Influenza B by PCR: NEGATIVE
Resp Syncytial Virus by PCR: NEGATIVE
SARS Coronavirus 2 by RT PCR: NEGATIVE

## 2024-06-12 LAB — BASIC METABOLIC PANEL WITH GFR
Anion gap: 13 (ref 5–15)
BUN: 31 mg/dL — ABNORMAL HIGH (ref 8–23)
CO2: 25 mmol/L (ref 22–32)
Calcium: 9.2 mg/dL (ref 8.9–10.3)
Chloride: 99 mmol/L (ref 98–111)
Creatinine, Ser: 1.42 mg/dL — ABNORMAL HIGH (ref 0.61–1.24)
GFR, Estimated: 56 mL/min — ABNORMAL LOW
Glucose, Bld: 172 mg/dL — ABNORMAL HIGH (ref 70–99)
Potassium: 3.8 mmol/L (ref 3.5–5.1)
Sodium: 137 mmol/L (ref 135–145)

## 2024-06-12 LAB — PRO BRAIN NATRIURETIC PEPTIDE: Pro Brain Natriuretic Peptide: 80.1 pg/mL

## 2024-06-12 MED ORDER — MORPHINE SULFATE (PF) 4 MG/ML IV SOLN: 4.0000 mg | Freq: Once | INTRAVENOUS | Status: DC

## 2024-06-12 MED ORDER — MORPHINE SULFATE (PF) 4 MG/ML IV SOLN
4.0000 mg | INTRAVENOUS | Status: DC | PRN
  Administered 2024-06-12: 4 mg via INTRAVENOUS
  Filled 2024-06-12: qty 1

## 2024-06-12 MED ORDER — MORPHINE SULFATE (PF) 4 MG/ML IV SOLN
4.0000 mg | Freq: Once | INTRAVENOUS | Status: AC
  Administered 2024-06-12: 4 mg via INTRAVENOUS
  Filled 2024-06-12: qty 1

## 2024-06-12 MED ORDER — DEXAMETHASONE SOD PHOSPHATE PF 10 MG/ML IJ SOLN
10.0000 mg | Freq: Once | INTRAMUSCULAR | Status: AC
  Administered 2024-06-12: 10 mg via INTRAVENOUS
  Filled 2024-06-12: qty 1

## 2024-06-12 MED ORDER — ONDANSETRON HCL 4 MG/2ML IJ SOLN
4.0000 mg | Freq: Once | INTRAMUSCULAR | Status: AC
  Administered 2024-06-12: 4 mg via INTRAVENOUS
  Filled 2024-06-12: qty 2

## 2024-06-12 MED ORDER — LEVOFLOXACIN IN D5W 500 MG/100ML IV SOLN
500.0000 mg | Freq: Once | INTRAVENOUS | Status: AC
  Administered 2024-06-12: 500 mg via INTRAVENOUS
  Filled 2024-06-12: qty 100

## 2024-06-12 NOTE — ED Notes (Signed)
 Pt put is original trach back in. Pt state his SLP advised him to so his stoma won't close. Pt is now complaining of pain. Dr Dorothyann sent a secure chat by this RN at this time.

## 2024-06-12 NOTE — ED Notes (Signed)
 Called [facility] per Dr. Jacolyn

## 2024-06-12 NOTE — ED Notes (Signed)
EMTALA reviewed by this RN and transfer consent signed. 

## 2024-06-12 NOTE — ED Provider Notes (Signed)
----------------------------------------- °  5:42 PM on 06/12/2024 ----------------------------------------- Patient care assumed from Dr. Elayne.  ENT Dr. 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  Will cover with IV Levaquin  and dose Decadron .  ENT strongly prefers that the patient be transferred back to [facility] as the patient's care has all been at [facility] as well as his laryngectomy.  I spoke to [facility] ENT they agree and have accepted the patient to their service.  We are awaiting a bed.  Patient being covered with antibiotics and steroids.  Patient agreeable to plan of care and has been updated on the transfer to [facility].   [name], MD 06/12/24 [phone]

## 2024-06-12 NOTE — ED Provider Notes (Signed)
 "  [facility] Provider Note    Event Date/Time   First MD Initiated Contact with Patient 06/12/24 1300     (approximate)   History   Shortness of Breath   HPI  [name] is a 64 y.o. male with history of tracheal stenosis and laryngectomy who presents with shortness of breath and passage of a blood clot from his tracheostomy site.  The patient states that last night he felt that the Lary tube that he has and was clogged.  He took it out.  This morning he felt short of breath and then passed a large clot from the thoracostomy site.  After this he still continues to feel short of breath and is a something was clogging his tracheostomy so he came in.  He reports shortness of breath.  Denies any cough or fever.  He has no chest pain.  I reviewed the past medical records.  The patient's most recent outpatient encounter was with psychiatry on 1/15 for follow-up of his chronic conditions.  His most recent ENT visit was at [facility] on 1/12.  The patient was scheduled for a CTA of the neck and CT chest but it does not appear that he had the studies.   Physical Exam   Triage Vital Signs: ED Triage Vitals  Encounter Vitals Group     BP --      Girls Systolic BP Percentile --      Girls Diastolic BP Percentile --      Boys Systolic BP Percentile --      Boys Diastolic BP Percentile --      Pulse --      Resp --      Temp 06/12/24 1259 98.3 F (36.8 C)     Temp Source 06/12/24 1259 Oral     SpO2 --      Weight 06/12/24 1304 245 lb (111.1 kg)     Height 06/12/24 1304 6' 1 (1.854 m)     Head Circumference --      Peak Flow --      Pain Score --      Pain Loc --      Pain Education --      Exclude from Growth Chart --     Most recent vital signs: Vitals:   06/12/24 1259  Temp: 98.3 F (36.8 C)     General: Awake, no distress.  CV:  Good peripheral perfusion.  Resp:  Normal effort.  Lungs CTAB. Abd:  No distention.  Other:  No peripheral edema.   Tracheostomy site clean and with no visible blood or mucus.  No discharge.   ED Results / Procedures / Treatments   Labs (all labs ordered are listed, but only abnormal results are displayed) Labs Reviewed  BASIC METABOLIC PANEL WITH GFR - Abnormal; Notable for the following components:      Result Value   Glucose, Bld 172 (*)    BUN 31 (*)    Creatinine, Ser 1.42 (*)    GFR, Estimated 56 (*)    All other components within normal limits  RESP PANEL BY RT-PCR (RSV, FLU A&B, COVID)  [id number]  CBC WITH DIFFERENTIAL/PLATELET  PRO BRAIN NATRIURETIC PEPTIDE     EKG  ED ECG REPORT I, Waylon Cassis, the attending physician, personally viewed and interpreted this ECG.  Date: 06/12/2024 EKG Time: 1301 Rate: 71 Rhythm: normal sinus rhythm QRS Axis: normal Intervals: normal ST/T Wave abnormalities: Nonspecific ST abnormalities Narrative Interpretation:  no evidence of acute ischemia    RADIOLOGY  Chest x-ray: I independently viewed and interpreted the images; there is atelectasis with no focal consolidation or edema  PROCEDURES:  Critical Care performed: Yes, see critical care procedure note(s)  .Critical Care  Performed by: Jacolyn Pae, MD Authorized by: Jacolyn Pae, MD   Critical care provider statement:    Critical care time (minutes):  30   Critical care time was exclusive of:  Separately billable procedures and treating other patients   Critical care was necessary to treat or prevent imminent or life-threatening deterioration of the following conditions: Respiratory crisis, possible airway compromise.   Critical care was time spent personally by me on the following activities:  Development of treatment plan with patient or surrogate, discussions with consultants, evaluation of patient's response to treatment, examination of patient, ordering and review of laboratory studies, ordering and review of radiographic studies, ordering and performing treatments  and interventions, pulse oximetry, re-evaluation of patient's condition, review of old charts and obtaining history from patient or surrogate    MEDICATIONS ORDERED IN ED: Medications  levofloxacin  (LEVAQUIN ) IVPB 500 mg (500 mg Intravenous New Bag/Given 06/12/24 1608)  dexamethasone  (DECADRON ) injection 10 mg (10 mg Intravenous Given 06/12/24 1607)     IMPRESSION / MDM / ASSESSMENT AND PLAN / ED COURSE  I reviewed the triage vital signs and the nursing notes.  64 year old male with PMH as noted above, status post laryngectomy and with a tracheostomy tube in place presents with worsening shortness of breath, sensation that the tracheostomy site is clogged, and he passed a large blood clot earlier.  Differential diagnosis includes, but is not limited to, tracheostomy closure malfunction, bleeding, fistula, tracheitis, respiratory infection.  Patient's presentation is most consistent with acute presentation with potential threat to life or bodily function.  The patient is on the cardiac monitor to evaluate for evidence of arrhythmia and/or significant heart rate changes.  At this time the patient is maintaining an O2 saturation in the high 90s with a trach collar in place at 6 L.  He has no acute respiratory distress.  We will obtain basic labs, chest x-ray, and I will consult ENT.  ----------------------------------------- 4:13 PM on 06/12/2024 -----------------------------------------  I consulted and discussed the case with Dr. Milissa from ENT who came to evaluate the patient.  He advises that the patient has significant tracheitis and stenosis.  He sees an area of crust where the innominate artery would be.  He advised that although the recent CT did not show a fistula, the patient's passage of blood and the exam is still concerning.  He recommends transfer to [facility] for further ENT evaluation.  I have signed the patient out to the oncoming ED physician Dr. Dorothyann who is contacting the  [facility] transfer center.   FINAL CLINICAL IMPRESSION(S) / ED DIAGNOSES   Final diagnoses:  Shortness of breath  Tracheostomy complication, unspecified complication type (HCC)     Rx / DC Orders   ED Discharge Orders     None        Note:  This document was prepared using Dragon voice recognition software and may include unintentional dictation errors.    Jacolyn Pae, MD 06/12/24 1614  "

## 2024-06-12 NOTE — Telephone Encounter (Signed)
 I spoke with patient [name] to confirm appointments on the following date(s): 2/23  [name]

## 2024-06-12 NOTE — ED Notes (Signed)
 Called [facility] for update on bed assignment. No bed at this time.

## 2024-06-12 NOTE — ED Notes (Signed)
 Report called to [facility], RN

## 2024-06-12 NOTE — ED Notes (Signed)
 Respiratory to bedside to assess pt's trach. Per Respiratory we do not have the kind of trach pt uses in stock here.

## 2024-06-12 NOTE — Consult Note (Signed)
 [name], [name] [id number] [birth year] Joel Drivers, MD  Reason for Consult: Tracheal bleeding, Shortness of breath  HPI: 64 year old male admitted to ER with acute onset of shortness of breath and difficulty breathing through stoma.  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  Patient reports doing ok until January where he had significant bleeding from his stoma.  Per patient and his mom, the amount of blood filled up half a suction canister or about .  He was evaluated and CT scan done 1/20 at [facility] that did not show tracheo-innominate fistula but did show diffuse tracheal swelling consistent with tracheitis and bronchitis.  He had not had any treatment of this since the scan 1/20.  Follow up appointment at [facility] was canceled due to weather and moved to 2/23 per patient and family.  This morning he had another acute onset of bleeding with per patient almost suffocating on the blood clot until it was suctioned out.  He then has had difficulty breathing since then and cannot fit his lary tube back in his stoma.  He reports constricted breathing that he reports is getting worse.  He is tolerating oxygen on trach collar 6L.  Of note, there was no fat plane between the brachioceophalic artery and the anterior trachea per CT report.  I am unable to view the images themselves.  Allergies: Allergies[1]  ROS: Review of systems normal other than 12 systems except per HPI.  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   Thyroid  disease     FH:  Family History  Problem Relation Age of Onset    Osteoporosis Mother    Diabetes Mother    Hypertension Father    Mental illness Neg Hx     SH:  Social History   Socioeconomic History   Marital status: Widowed    Spouse name: [name]   Number of children: Not on file   Years of education: Not on file   Highest education level: Not on file  Occupational History   Not on file  Tobacco Use   Smoking status: Former    Current packs/day: 0.00    Types: Cigarettes    Quit date: 04/09/2013    Years since quitting: 11.1   Smokeless tobacco: Never  Vaping Use   Vaping status: Never Used  Substance and Sexual Activity   Alcohol use: Yes    Alcohol/week: 2.0 standard drinks of alcohol    Types: 2 Shots of liquor per week   Drug use: No    Comment: Pt denied; UDS not available   Sexual activity: Yes    Partners: Female    Birth control/protection: Condom  Other Topics Concern   Not on file  Social History Narrative   Not on file   Social Holder of Health   Tobacco Use: Medium Risk (05/22/2024)   Patient History    Smoking Tobacco Use: Former    Smokeless Tobacco Use: Never    Passive Exposure: Not  on file  Financial Resource Strain: Low Risk  (12/19/2023)   Received from [facility]   Overall Financial Resource Strain (CARDIA)    Difficulty of Paying Living Expenses: Not hard at all  Food Insecurity: No Food Insecurity (03/26/2024)   Epic    Worried About Programme Researcher, Broadcasting/film/video in the Last Year: Never true    Ran Out of Food in the Last Year: Never true  Transportation Needs: No Transportation Needs (03/26/2024)   Epic    Lack of Transportation (Medical): No    Lack of Transportation (Non-Medical): No  Physical Activity: Sufficiently Active (07/12/2023)   Exercise Vital Sign    Days of Exercise per Week: 7 days    Minutes of Exercise per Session: 30 min  Stress: Stress Concern Present (07/12/2023)   [employer] of Occupational Health - Occupational Stress Questionnaire    Feeling of Stress : Very  much  Social Connections: Socially Isolated (07/12/2023)   Social Connection and Isolation Panel    Frequency of Communication with Friends and Family: More than three times a week    Frequency of Social Gatherings with Friends and Family: Never    Attends Religious Services: Never    Database Administrator or Organizations: No    Attends Banker Meetings: Never    Marital Status: Widowed  Intimate Partner Violence: Not At Risk (05/06/2024)   Received from [facility]   Epic    Within the last year, have you been afraid of your partner or ex-partner?: No    Within the last year, have you been humiliated or emotionally abused in other ways by your partner or ex-partner?: No    Within the last year, have you been kicked, hit, slapped, or otherwise physically hurt by your partner or ex-partner?: No    Within the last year, have you been raped or forced to have any kind of sexual activity by your partner or ex-partner?: No  Depression (PHQ2-9): High Risk (05/22/2024)   Depression (PHQ2-9)    PHQ-2 Score: 20  Alcohol Screen: Low Risk (03/26/2024)   Alcohol Screen    Last Alcohol Screening Score (AUDIT): 0  Housing: Low Risk (03/26/2024)   Epic    Unable to Pay for Housing in the Last Year: No    Number of Times Moved in the Last Year: 0    Homeless in the Last Year: No  Utilities: Not At Risk (03/26/2024)   Epic    Threatened with loss of utilities: No  Health Literacy: Adequate Health Literacy (07/12/2023)   B1300 Health Literacy    Frequency of need for help with medical instructions: Never    PSH:  Past Surgical History:  Procedure Laterality Date   COLONOSCOPY WITH PROPOFOL  N/A 05/15/2018   Procedure: COLONOSCOPY WITH PROPOFOL ;  Surgeon: [name], Ladell POUR, MD;  Location: [facility] ENDOSCOPY;  Service: Gastroenterology;  Laterality: N/A;   ESOPHAGOGASTRODUODENOSCOPY N/A 05/15/2018   Procedure: ESOPHAGOGASTRODUODENOSCOPY (EGD);  Surgeon: [name], Ladell POUR, MD;  Location: [facility]  ENDOSCOPY;  Service: Gastroenterology;  Laterality: N/A;   FRACTURE SURGERY     Heel bone reconstruction Left    HERNIA REPAIR  02/2011   Umbilical hernia repair    LARYNGECTOMY     LOWER EXTREMITY ANGIOGRAPHY Left 01/31/2021   Procedure: LOWER EXTREMITY ANGIOGRAPHY;  Surgeon: Marea Selinda RAMAN, MD;  Location: [facility] INVASIVE CV LAB;  Service: Cardiovascular;  Laterality: Left;   LOWER EXTREMITY ANGIOGRAPHY Right 02/14/2021   Procedure: LOWER EXTREMITY ANGIOGRAPHY;  Surgeon: Marea Selinda RAMAN, MD;  Location: [facility] INVASIVE CV LAB;  Service: Cardiovascular;  Laterality: Right;   NECK SURGERY     fusion   SPINE SURGERY     TRACHEOSTOMY  2014   from SI attempt    Physical  Exam:  GEN-  Obese male supine in bed RESP-  use of mild accessory muscles with whistling from stoma with inspiration and experation NEURO-  CN 2-12 grossly intact and symmetric. OC/OP-  no abnormal masses or lesions EARS-  external ears clear EXT-  warm EYE- EOMI NOSE-  clear anteriorly NECK-  stoma visualized, TEP in place.  Moderate granulation tissue surrounding TEP.  Significant stenosis of stoma circumferentially as well as narrowing of the tracheal lumen with crusting extending inferiorly to carina.  Crusting of anterior trachea with dried blood present.  This was strongly adherent to anterior trachea and stoma skin  Procedure:  Tracheoscopy.  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  Post-procedure diagnosis:  same.  Description of procedure:  A fiberoptic scope was advanced beyond the stomal crusting and dried blood.  This demonstrated diffuse edema and crusting circumferentially of the trachea extending to carina.  TEP in place with granulation tissue surrounding.  CTA Neck @ [facility] 1/20-  Surgically absent hypopharynx/larynx with tracheostomy and TEP device.   Circumferential thickening of the trachea and esophagus likely indicative of tracheitis and esophagitis.   Loss of  fat plane between the brachiocephalic artery and anterior trachea, but no evidence of fistulous tract and no endoluminal debris or contrast material within the trachea at this time.    A/P: History of Laryngectomy with stomal bleeding, stomal stenosis, and tracheitis on CT 1/20 At [facility] as well as exam today.  Plan:  Discussed findings with patient and family.  His history is concerning regarding amount of bleeding that has occurred twice.  Patient reports about in January and then again a large clot this morning that caused suffocation episode until it was suctioned out.  He has dried blood along anterior trachea and crusting that is adherent to anterior trachea in area of brachiocephalic artery that has no fat plane between it and the anterior trachea.  No fistula seen on CT scan but still high concern especially given amount of bleeding.  Bleeding may be secondary to significant tracheitis as well.  Recommend aggressive humidification with continued humidified oxygen.  Use of saline to help with crusting.  Recommend treatment with Levaquin  and can add steroids to help with inflammation seen.  Given patient complexity and history at [facility] along with recent imaging there, recommend transfer to [facility] as still high concern for possible tracheo-innominate and possible need for stomaplasty given very narrow and tight stoma.  If patient develops acute airway compromise, patient can be intubated currently with small ETT through stoma.  If bleeding returns recommend acute intubation to protect airway and digital pressure on innominate.   Carolee Rina Adney 06/12/2024 4:17 PM       [1]  Allergies Allergen Reactions   Pregabalin Other (See Comments), Anaphylaxis and Swelling    Gum Bleeding  Mouth/ gums swelling and bleeding  Mouth swelling and bleeding gums   Buspar [Buspirone]     Makes the patient flip out   Depakote  [Valproic Acid ]     Causes excessive drowsiness   Clopidogrel Rash    Gabapentin Itching and Rash   Vraylar  [Cariprazine ] Rash

## 2024-06-12 NOTE — ED Triage Notes (Signed)
 Pt BIB EMS from home with complaints of SOB that started last night. Pt has a trach that he pulled out last night thinking it was clogged. Pt continued to have some SOB this morning.

## 2024-06-30 ENCOUNTER — Ambulatory Visit: Payer: Self-pay | Admitting: Psychiatry
